# Patient Record
Sex: Male | Born: 1950 | Race: White | Hispanic: No | Marital: Single | Smoking: Current some day smoker
Health system: Southern US, Community
[De-identification: ages and names within clinical notes are randomized; demographics above are authoritative.]

## PROBLEM LIST (undated history)

## (undated) DIAGNOSIS — F32A Depression, unspecified: Secondary | ICD-10-CM

## (undated) DIAGNOSIS — F329 Major depressive disorder, single episode, unspecified: Secondary | ICD-10-CM

## (undated) DIAGNOSIS — C801 Malignant (primary) neoplasm, unspecified: Secondary | ICD-10-CM

## (undated) DIAGNOSIS — E669 Obesity, unspecified: Secondary | ICD-10-CM

## (undated) DIAGNOSIS — R0600 Dyspnea, unspecified: Secondary | ICD-10-CM

## (undated) DIAGNOSIS — I4891 Unspecified atrial fibrillation: Secondary | ICD-10-CM

## (undated) DIAGNOSIS — M1990 Unspecified osteoarthritis, unspecified site: Secondary | ICD-10-CM

## (undated) DIAGNOSIS — I739 Peripheral vascular disease, unspecified: Secondary | ICD-10-CM

## (undated) DIAGNOSIS — R011 Cardiac murmur, unspecified: Secondary | ICD-10-CM

## (undated) DIAGNOSIS — E119 Type 2 diabetes mellitus without complications: Secondary | ICD-10-CM

## (undated) DIAGNOSIS — J449 Chronic obstructive pulmonary disease, unspecified: Secondary | ICD-10-CM

## (undated) DIAGNOSIS — E785 Hyperlipidemia, unspecified: Secondary | ICD-10-CM

## (undated) DIAGNOSIS — I10 Essential (primary) hypertension: Secondary | ICD-10-CM

## (undated) DIAGNOSIS — K219 Gastro-esophageal reflux disease without esophagitis: Secondary | ICD-10-CM

## (undated) DIAGNOSIS — G43909 Migraine, unspecified, not intractable, without status migrainosus: Secondary | ICD-10-CM

## (undated) DIAGNOSIS — D649 Anemia, unspecified: Secondary | ICD-10-CM

## (undated) DIAGNOSIS — I8390 Asymptomatic varicose veins of unspecified lower extremity: Secondary | ICD-10-CM

## (undated) DIAGNOSIS — I2510 Atherosclerotic heart disease of native coronary artery without angina pectoris: Secondary | ICD-10-CM

## (undated) DIAGNOSIS — F419 Anxiety disorder, unspecified: Secondary | ICD-10-CM

## (undated) DIAGNOSIS — G4730 Sleep apnea, unspecified: Secondary | ICD-10-CM

## (undated) DIAGNOSIS — I350 Nonrheumatic aortic (valve) stenosis: Secondary | ICD-10-CM

## (undated) DIAGNOSIS — I872 Venous insufficiency (chronic) (peripheral): Secondary | ICD-10-CM

## (undated) DIAGNOSIS — I5032 Chronic diastolic (congestive) heart failure: Secondary | ICD-10-CM

## (undated) HISTORY — PX: ANKLE SURGERY: SHX546

## (undated) HISTORY — PX: TONSILLECTOMY AND ADENOIDECTOMY: SUR1326

## (undated) HISTORY — DX: Venous insufficiency (chronic) (peripheral): I87.2

## (undated) HISTORY — DX: Obesity, unspecified: E66.9

## (undated) HISTORY — DX: Sleep apnea, unspecified: G47.30

## (undated) HISTORY — PX: KNEE ARTHROSCOPY: SHX127

## (undated) HISTORY — PX: UPPER GI ENDOSCOPY: SHX6162

## (undated) HISTORY — DX: Asymptomatic varicose veins of unspecified lower extremity: I83.90

## (undated) HISTORY — PX: OTHER SURGICAL HISTORY: SHX169

## (undated) HISTORY — DX: Atherosclerotic heart disease of native coronary artery without angina pectoris: I25.10

## (undated) HISTORY — DX: Migraine, unspecified, not intractable, without status migrainosus: G43.909

## (undated) HISTORY — DX: Hyperlipidemia, unspecified: E78.5

## (undated) HISTORY — PX: COLONOSCOPY W/ POLYPECTOMY: SHX1380

---
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
## 1998-09-24 ENCOUNTER — Ambulatory Visit (HOSPITAL_COMMUNITY): Admission: RE | Admit: 1998-09-24 | Discharge: 1998-09-24 | Payer: Self-pay | Admitting: Endocrinology

## 1998-12-26 ENCOUNTER — Ambulatory Visit (HOSPITAL_COMMUNITY): Admission: RE | Admit: 1998-12-26 | Discharge: 1998-12-26 | Payer: Self-pay | Admitting: Endocrinology

## 1998-12-26 ENCOUNTER — Encounter: Payer: Self-pay | Admitting: Endocrinology

## 1999-07-10 ENCOUNTER — Emergency Department (HOSPITAL_COMMUNITY): Admission: EM | Admit: 1999-07-10 | Discharge: 1999-07-10 | Payer: Self-pay | Admitting: Emergency Medicine

## 1999-07-10 ENCOUNTER — Encounter: Payer: Self-pay | Admitting: Emergency Medicine

## 1999-08-26 ENCOUNTER — Ambulatory Visit (HOSPITAL_COMMUNITY): Admission: RE | Admit: 1999-08-26 | Discharge: 1999-08-26 | Payer: Self-pay | Admitting: Endocrinology

## 1999-08-26 ENCOUNTER — Encounter: Payer: Self-pay | Admitting: Endocrinology

## 1999-09-02 ENCOUNTER — Ambulatory Visit (HOSPITAL_COMMUNITY): Admission: RE | Admit: 1999-09-02 | Discharge: 1999-09-02 | Payer: Self-pay | Admitting: Endocrinology

## 2000-09-26 ENCOUNTER — Ambulatory Visit (HOSPITAL_BASED_OUTPATIENT_CLINIC_OR_DEPARTMENT_OTHER): Admission: RE | Admit: 2000-09-26 | Discharge: 2000-09-26 | Payer: Self-pay | Admitting: Neurology

## 2000-11-30 ENCOUNTER — Ambulatory Visit (HOSPITAL_BASED_OUTPATIENT_CLINIC_OR_DEPARTMENT_OTHER): Admission: RE | Admit: 2000-11-30 | Discharge: 2000-11-30 | Payer: Self-pay

## 2004-11-17 ENCOUNTER — Emergency Department (HOSPITAL_COMMUNITY): Admission: EM | Admit: 2004-11-17 | Discharge: 2004-11-17 | Payer: Self-pay | Admitting: *Deleted

## 2004-11-25 ENCOUNTER — Ambulatory Visit (HOSPITAL_COMMUNITY): Admission: RE | Admit: 2004-11-25 | Discharge: 2004-11-25 | Payer: Self-pay | Admitting: Orthopedic Surgery

## 2007-08-02 ENCOUNTER — Ambulatory Visit: Payer: Self-pay | Admitting: Vascular Surgery

## 2007-08-02 ENCOUNTER — Ambulatory Visit (HOSPITAL_COMMUNITY): Admission: RE | Admit: 2007-08-02 | Discharge: 2007-08-02 | Payer: Self-pay | Admitting: Endocrinology

## 2007-08-02 ENCOUNTER — Encounter (INDEPENDENT_AMBULATORY_CARE_PROVIDER_SITE_OTHER): Payer: Self-pay | Admitting: Endocrinology

## 2010-05-03 ENCOUNTER — Encounter (HOSPITAL_BASED_OUTPATIENT_CLINIC_OR_DEPARTMENT_OTHER)
Admission: RE | Admit: 2010-05-03 | Discharge: 2010-06-10 | Payer: Self-pay | Source: Home / Self Care | Attending: Internal Medicine | Admitting: Internal Medicine

## 2010-05-07 ENCOUNTER — Ambulatory Visit (HOSPITAL_COMMUNITY): Admission: RE | Admit: 2010-05-07 | Discharge: 2010-05-07 | Payer: Self-pay | Admitting: Internal Medicine

## 2010-05-11 ENCOUNTER — Ambulatory Visit: Payer: Self-pay | Admitting: Vascular Surgery

## 2010-11-02 NOTE — Procedures (Signed)
DUPLEX DEEP VENOUS EXAM - LOWER EXTREMITY   INDICATION:  Bilateral lower extremity ulceration.   HISTORY:  Edema:  Yes.  Trauma/Surgery:  Left lower extremity.  Pain:  Bilateral.  PE:  No.  Previous DVT:  No.  Anticoagulants:  Other:   DUPLEX EXAM:                CFV   SFV   PopV  PTV    GSV                R  L  R  L  R  L  R   L  R  L  Thrombosis    0  0  0  0  0  0  0   0  0  0  Spontaneous   +  +  +  +  +  +  +   +  +  +  Phasic        +  +  +  +  +  +  +   +  +  +  Augmentation  +  +  +  +  +  +  +   +  +  +  Compressible  +  +  +  +  +  +  +   +  +  +  Competent     0  0  0  0  +  +  +   +  +  +   Legend:  + - yes  o - no  p - partial  D - decreased   IMPRESSION:  1. No evidence of deep venous thrombosis / superficial venous      thrombophlebitis in the bilateral lower extremity.  2. No significant reflux in the superficial venous system although      there are multiple branches noted in the bilateral calves.  3. Chronic venous insufficiency observed in the bilateral CFV and SFV,      left greater than right.  4. Technically difficult study due to edema.  Bilateral peroneal veins      not visualized.    _____________________________  Janetta Hora Darrick Penna, M.D.   LT/MEDQ  D:  05/11/2010  T:  05/11/2010  Job:  [id number]

## 2010-11-05 NOTE — Op Note (Signed)
NAME:  Jordan [name], STEIL NO.:  [id number]   MEDICAL RECORD NO.:  [id number]          PATIENT TYPE:  OIB   LOCATION:  2550                         FACILITY:  [facility]   PHYSICIAN:  Doralee Albino. Carola Frost, M.D. DATE OF BIRTH:  May 02, 1951   DATE OF PROCEDURE:  11/25/2004  DATE OF DISCHARGE:                                 OPERATIVE REPORT   PREOPERATIVE DIAGNOSIS:  Displaced left medial malleolus fracture.   POSTOPERATIVE DIAGNOSIS:  Displaced left medial malleolus fracture.   PROCEDURE:  Open reduction and internal fixation, left medial malleolus.   ASSISTANT:  Cecil Cranker, PA.   ANESTHESIA:  General, supplemented with popliteal block.   COMPLICATIONS:  None.   TOURNIQUET:  None.   ESTIMATED BLOOD LOSS:  Scant.   DISPOSITION:  To PACU.   CONDITION:  Stable.   BRIEF SUMMARY OF INDICATION FOR PROCEDURE:  Jordan [name] is a 60-year-  old white male diabetic who sustained a left long finger metacarpal fracture  and a left medial malleolus fracture as well.  The patient was treated  initially with a Cam boot and weightbearing as tolerated by consulting  physician and then was referred to [place] for further management.  After  discussion of risks and benefits of surgery, the patient wished to proceed  with reduction and internal fixation of his medial malleolus fracture.  He  understood the risks to include infection, nerve injury,vessel injury,  malunion, nonunion, and possible need for further surgery.  After full  discussion of these risks, the patient wished to proceed.  Also of note, he  did have an anterior tibial scratch which appeared to have some surrounding  erythema and was treated preoperatively with Duricef.  It did not  appreciably improve, and, consequently, he was given Unasyn preoperatively.   BRIEF DESCRIPTION OF PROCEDURE:  [name] was taken to the operating  room where general anesthesia was induced and Unasyn was given for  preoperative antibiotics.  Standard prep and drape were performed of his  left lower extremity.  The erythema around his shin did not come close to  his surgical site and did appear to improve with the Unasyn over the course  of the procedure.  No tourniquet was used.  A standard curvilinear incision  along the anterior and distal aspects of the medial malleolus was made, the  saphenous vein identified and protected throughout the procedure, and the  talus examined for chondral injury.  There was no significant chondral  injury.  The posterior tibial tendon was also visualized and was intact.  The fracture edges were elevated with a 15 blade to brush back the  periosteum to allow for an anatomic reduction, which was held with a K wire  while a drill was placed posterior to this.  A screw was inserted and then a  second placed anteriorly where the pin had been.  This resulted in an  anatomic reduction with good compression across the fracture site as  visualized on AP lateral and mortise images.  The wound was copiously  irrigated and closed in standard layer fashion with 2-0 Vicryl and 3-0 nylon  simple sutures.  A sterile, gently compressive dressing was applied, using  posterior and stirrup slabs of plaster.  The patient was awakened from  anesthesia and transported to the PACU in stable condition.   PROGNOSIS:  [name] prognosis with this fracture is good.  However,  because of his diabetes, he is at elevated risk of infection and wound  complications, and, because of his CPAP, we are anticipating an overnight  admission for observation.  If he completely recovers, is feeling well and  wishes to go home and has adequate supervision there, then he may use his  own CPAP machine at home as well.  We will switch his antibiotic coverage  for cellulitis to Augmentin, and we will see him back early, in the clinic,  to make sure that this has resolved uneventfully.  He will maintain   nonweightbearing until his follow-up appointment, at which time we  anticipate some graduated return to weightbearing as his wound and  cellulitis stabilize.  We will allow him to begin weightbearing using his  upper extremity with or without a platform walker as tolerated, given this  is essentially a nondisplaced base of the long finger metacarpal fracture.       MHH/MEDQ  D:  11/25/2004  T:  11/25/2004  Job:  [id number]

## 2012-09-13 ENCOUNTER — Inpatient Hospital Stay (HOSPITAL_COMMUNITY)
Admission: AD | Admit: 2012-09-13 | Discharge: 2012-09-17 | DRG: 603 | Disposition: A | Discharge status: Home / Self Care | Payer: Medicare Other | Source: Ambulatory Visit | Attending: Endocrinology | Admitting: Endocrinology

## 2012-09-13 ENCOUNTER — Other Ambulatory Visit: Payer: Self-pay | Admitting: Endocrinology

## 2012-09-13 ENCOUNTER — Encounter (HOSPITAL_COMMUNITY): Payer: Self-pay | Admitting: *Deleted

## 2012-09-13 DIAGNOSIS — F329 Major depressive disorder, single episode, unspecified: Secondary | ICD-10-CM | POA: Diagnosis present

## 2012-09-13 DIAGNOSIS — L03119 Cellulitis of unspecified part of limb: Principal | ICD-10-CM | POA: Diagnosis present

## 2012-09-13 DIAGNOSIS — E119 Type 2 diabetes mellitus without complications: Secondary | ICD-10-CM | POA: Insufficient documentation

## 2012-09-13 DIAGNOSIS — R609 Edema, unspecified: Secondary | ICD-10-CM

## 2012-09-13 DIAGNOSIS — L03116 Cellulitis of left lower limb: Secondary | ICD-10-CM | POA: Insufficient documentation

## 2012-09-13 DIAGNOSIS — R269 Unspecified abnormalities of gait and mobility: Secondary | ICD-10-CM | POA: Diagnosis present

## 2012-09-13 DIAGNOSIS — R7989 Other specified abnormal findings of blood chemistry: Secondary | ICD-10-CM | POA: Diagnosis present

## 2012-09-13 DIAGNOSIS — F3289 Other specified depressive episodes: Secondary | ICD-10-CM | POA: Diagnosis present

## 2012-09-13 DIAGNOSIS — I10 Essential (primary) hypertension: Secondary | ICD-10-CM | POA: Diagnosis present

## 2012-09-13 DIAGNOSIS — L02419 Cutaneous abscess of limb, unspecified: Principal | ICD-10-CM | POA: Diagnosis present

## 2012-09-13 DIAGNOSIS — E871 Hypo-osmolality and hyponatremia: Secondary | ICD-10-CM | POA: Diagnosis present

## 2012-09-13 DIAGNOSIS — K219 Gastro-esophageal reflux disease without esophagitis: Secondary | ICD-10-CM | POA: Diagnosis present

## 2012-09-13 DIAGNOSIS — D649 Anemia, unspecified: Secondary | ICD-10-CM | POA: Diagnosis present

## 2012-09-13 DIAGNOSIS — I872 Venous insufficiency (chronic) (peripheral): Secondary | ICD-10-CM | POA: Diagnosis present

## 2012-09-13 DIAGNOSIS — E876 Hypokalemia: Secondary | ICD-10-CM | POA: Diagnosis present

## 2012-09-13 DIAGNOSIS — E785 Hyperlipidemia, unspecified: Secondary | ICD-10-CM | POA: Diagnosis present

## 2012-09-13 HISTORY — DX: Anxiety disorder, unspecified: F41.9

## 2012-09-13 HISTORY — DX: Chronic obstructive pulmonary disease, unspecified: J44.9

## 2012-09-13 HISTORY — DX: Unspecified osteoarthritis, unspecified site: M19.90

## 2012-09-13 HISTORY — DX: Major depressive disorder, single episode, unspecified: F32.9

## 2012-09-13 HISTORY — DX: Depression, unspecified: F32.A

## 2012-09-13 HISTORY — DX: Essential (primary) hypertension: I10

## 2012-09-13 HISTORY — DX: Gastro-esophageal reflux disease without esophagitis: K21.9

## 2012-09-13 HISTORY — DX: Type 2 diabetes mellitus without complications: E11.9

## 2012-09-13 LAB — COMPREHENSIVE METABOLIC PANEL
Albumin: 2.6 g/dL — ABNORMAL LOW (ref 3.5–5.2)
BUN: 13 mg/dL (ref 6–23)
CO2: 25 mEq/L (ref 19–32)
Chloride: 85 mEq/L — ABNORMAL LOW (ref 96–112)
Glucose, Bld: 107 mg/dL — ABNORMAL HIGH (ref 70–99)
Sodium: 124 mEq/L — ABNORMAL LOW (ref 135–145)
Total Bilirubin: 0.7 mg/dL (ref 0.3–1.2)
Total Protein: 7.2 g/dL (ref 6.0–8.3)

## 2012-09-13 LAB — CBC
HCT: 31.2 % — ABNORMAL LOW (ref 39.0–52.0)
Hemoglobin: 9.7 g/dL — ABNORMAL LOW (ref 13.0–17.0)
RDW: 19.1 % — ABNORMAL HIGH (ref 11.5–15.5)

## 2012-09-13 LAB — GLUCOSE, CAPILLARY: Glucose-Capillary: 117 mg/dL — ABNORMAL HIGH (ref 70–99)

## 2012-09-13 MED ORDER — VANCOMYCIN HCL 10 G IV SOLR
2500.0000 mg | Freq: Once | INTRAVENOUS | Status: AC
  Administered 2012-09-13: 2500 mg via INTRAVENOUS
  Filled 2012-09-13 (×2): qty 2500

## 2012-09-13 MED ORDER — FUROSEMIDE 40 MG PO TABS
40.0000 mg | ORAL_TABLET | Freq: Two times a day (BID) | ORAL | Status: DC
  Administered 2012-09-14 – 2012-09-17 (×7): 40 mg via ORAL
  Filled 2012-09-13 (×10): qty 1

## 2012-09-13 MED ORDER — PANTOPRAZOLE SODIUM 40 MG PO TBEC
40.0000 mg | DELAYED_RELEASE_TABLET | Freq: Every day | ORAL | Status: DC
  Administered 2012-09-14 – 2012-09-16 (×3): 40 mg via ORAL
  Filled 2012-09-13 (×5): qty 1

## 2012-09-13 MED ORDER — METHOCARBAMOL 500 MG PO TABS: 500.0000 mg | ORAL_TABLET | Freq: Three times a day (TID) | ORAL | Status: DC | PRN

## 2012-09-13 MED ORDER — ALISKIREN FUMARATE 150 MG PO TABS
300.0000 mg | ORAL_TABLET | Freq: Every day | ORAL | Status: DC
  Administered 2012-09-14 – 2012-09-16 (×3): 300 mg via ORAL
  Filled 2012-09-13 (×6): qty 2

## 2012-09-13 MED ORDER — ASPIRIN 81 MG PO CHEW
81.0000 mg | CHEWABLE_TABLET | Freq: Every day | ORAL | Status: DC
  Administered 2012-09-14 – 2012-09-16 (×3): 81 mg via ORAL
  Filled 2012-09-13 (×5): qty 1

## 2012-09-13 MED ORDER — INSULIN ASPART 100 UNIT/ML ~~LOC~~ SOLN: 0.0000 [IU] | Freq: Every day | SUBCUTANEOUS | Status: DC

## 2012-09-13 MED ORDER — ATENOLOL 50 MG PO TABS
50.0000 mg | ORAL_TABLET | Freq: Every day | ORAL | Status: DC
  Administered 2012-09-14 – 2012-09-16 (×3): 50 mg via ORAL
  Filled 2012-09-13 (×5): qty 1

## 2012-09-13 MED ORDER — INSULIN ASPART 100 UNIT/ML ~~LOC~~ SOLN
0.0000 [IU] | Freq: Three times a day (TID) | SUBCUTANEOUS | Status: DC
Start: 2012-09-13 — End: 2012-09-17

## 2012-09-13 MED ORDER — INSULIN GLARGINE 100 UNIT/ML ~~LOC~~ SOLN
10.0000 [IU] | Freq: Every day | SUBCUTANEOUS | Status: DC
  Administered 2012-09-14 – 2012-09-16 (×3): 10 [IU] via SUBCUTANEOUS
  Filled 2012-09-13 (×4): qty 0.1

## 2012-09-13 MED ORDER — LORAZEPAM 1 MG PO TABS
1.0000 mg | ORAL_TABLET | Freq: Three times a day (TID) | ORAL | Status: DC
  Administered 2012-09-13 – 2012-09-16 (×10): 1 mg via ORAL
  Filled 2012-09-13 (×10): qty 1

## 2012-09-13 MED ORDER — VENLAFAXINE HCL ER 150 MG PO CP24
150.0000 mg | ORAL_CAPSULE | Freq: Every day | ORAL | Status: DC
  Filled 2012-09-13: qty 1

## 2012-09-13 MED ORDER — VENLAFAXINE HCL ER 75 MG PO CP24
75.0000 mg | ORAL_CAPSULE | Freq: Every day | ORAL | Status: DC
  Administered 2012-09-14 – 2012-09-17 (×4): 75 mg via ORAL
  Filled 2012-09-13 (×5): qty 1

## 2012-09-13 MED ORDER — ENOXAPARIN SODIUM 40 MG/0.4ML ~~LOC~~ SOLN
40.0000 mg | SUBCUTANEOUS | Status: DC
  Administered 2012-09-14 – 2012-09-16 (×3): 40 mg via SUBCUTANEOUS
  Filled 2012-09-13 (×5): qty 0.4

## 2012-09-13 MED ORDER — VENLAFAXINE HCL ER 150 MG PO CP24
150.0000 mg | ORAL_CAPSULE | Freq: Every day | ORAL | Status: DC
  Administered 2012-09-13 – 2012-09-16 (×4): 150 mg via ORAL
  Filled 2012-09-13 (×5): qty 1

## 2012-09-13 MED ORDER — INSULIN ASPART 100 UNIT/ML ~~LOC~~ SOLN
3.0000 [IU] | Freq: Three times a day (TID) | SUBCUTANEOUS | Status: DC
  Administered 2012-09-14 – 2012-09-16 (×6): 3 [IU] via SUBCUTANEOUS

## 2012-09-13 MED ORDER — PIPERACILLIN-TAZOBACTAM 3.375 G IVPB
3.3750 g | Freq: Three times a day (TID) | INTRAVENOUS | Status: DC
  Administered 2012-09-13 – 2012-09-17 (×11): 3.375 g via INTRAVENOUS
  Filled 2012-09-13 (×12): qty 50

## 2012-09-13 MED ORDER — ATORVASTATIN CALCIUM 80 MG PO TABS
80.0000 mg | ORAL_TABLET | Freq: Every day | ORAL | Status: DC
  Administered 2012-09-13 – 2012-09-16 (×4): 80 mg via ORAL
  Filled 2012-09-13 (×6): qty 1

## 2012-09-13 MED ORDER — PERPHENAZINE 2 MG PO TABS
2.0000 mg | ORAL_TABLET | Freq: Two times a day (BID) | ORAL | Status: DC
  Administered 2012-09-13 – 2012-09-16 (×6): 2 mg via ORAL
  Filled 2012-09-13 (×9): qty 1

## 2012-09-13 MED ORDER — VANCOMYCIN HCL 10 G IV SOLR
1250.0000 mg | Freq: Two times a day (BID) | INTRAVENOUS | Status: DC
  Administered 2012-09-14 – 2012-09-17 (×7): 1250 mg via INTRAVENOUS
  Filled 2012-09-13 (×7): qty 1250

## 2012-09-13 NOTE — Progress Notes (Signed)
ANTIBIOTIC CONSULT NOTE - INITIAL  Pharmacy Consult for Vancomycin/Zosyn Indication: cellulitis/abscess  Allergies  Allergen Reactions  . Altace (Ramipril)     Dizziness, migraine, weakness  . Codeine Itching  . Naproxen Itching    Patient Measurements: Height: 6' (182.9 cm) Weight: 269 lb 4.8 oz (122.154 kg) IBW/kg (Calculated) : 77.6 Adjusted Body Weight: 95.4kg  Vital Signs: Temp: 98.2 F (36.8 C) (03/27 1704) Temp src: Oral (03/27 1704) BP: 126/50 mmHg (03/27 1704) Pulse Rate: 82 (03/27 1704) Intake/Output from previous day:   Intake/Output from this shift:    Labs:  Recent Labs  09/13/12 1710  WBC 13.3*  HGB 9.7*  PLT 239  CREATININE 0.81   Estimated Creatinine Clearance: 127.6 ml/min (by C-G formula based on Cr of 0.81). No results found for this basename: VANCOTROUGH, VANCOPEAK, VANCORANDOM, GENTTROUGH, GENTPEAK, GENTRANDOM, TOBRATROUGH, TOBRAPEAK, TOBRARND, AMIKACINPEAK, AMIKACINTROU, AMIKACIN,  in the last 72 hours   Microbiology: No results found for this or any previous visit (from the past 720 hour(s)).  Medical History: Past Medical History  Diagnosis Date  . Anxiety   . Arthritis   . COPD (chronic obstructive pulmonary disease)   . Depression   . Diabetes mellitus without complication   . GERD (gastroesophageal reflux disease)   . Hypertension     Medications:  Scheduled:  . aliskiren  300 mg Oral Daily  . aspirin  81 mg Oral Daily  . atenolol  50 mg Oral Daily  . atorvastatin  80 mg Oral q1800  . enoxaparin (LOVENOX) injection  40 mg Subcutaneous Q24H  . furosemide  40 mg Oral BID  . insulin aspart  0-15 Units Subcutaneous TID WC  . insulin aspart  0-5 Units Subcutaneous QHS  . insulin aspart  3 Units Subcutaneous TID WC  . insulin glargine  10 Units Subcutaneous QHS  . LORazepam  1 mg Oral TID  . pantoprazole  40 mg Oral Daily  . perphenazine  2 mg Oral BID  . [START ON 09/14/2012] venlafaxine XR  150 mg Oral Q breakfast    Infusions:   PRN: methocarbamol Assessment: 62 yo male with hx DM to be admitted with leg cellulitis/abscess that has worsened after starting Augmentin as outpatient. Note patient also with healing DM foot ulcer  Goal of Therapy:  Vancomycin trough level 10-15 mcg/ml  Plan:  1) Vancomycin 2500 mg IV x 1 loading dose, then Vancomycin 1250mg  IV q 12 hours 2) Zosyn 3.375 GM IV q 8 hour EI Measure antibiotic drug levels at steady state  Loletta Specter 09/13/2012,7:45 PM

## 2012-09-13 NOTE — H&P (Signed)
Jordan [name] DOB 17-Sep-1950   Calculated Weight: Cant stand  lbs., ( 0 kg) Height: 69.5 in., ( 176.53 cm) Pulse rate: 80  Blood Pressure #1: 126 / 70 mm Hg    BSA: 0   Vitals entered by: Daryel November CMA on September 13, 2012 11:19 AM        Risk Factors:  Tobacco use:  former smoker    Year quit:  08-09    Pack-years:  2ppd x32y Passive smoke exposure:  no Drug use:  no HIV high-risk behavior:  no Caffeine use:  0 drinks per day Alcohol use:  no Exercise:  yes    Times per week:  5    Type:  walk/greenhouse Seatbelt use:  100 % Sun Exposure:  occasionally  Colonoscopy History:    Date of Last Colonoscopy:  04/26/2011  History of Present Illness  Reason for visit: Routine Follow-up Chief Complaint: Leg is worse and leaking fluid History of Present Illness: Seen 3 days ago. mid left leg redness noted. On augmentin Leg is now leaking fluid and is more red no f/c/s bowels OK BS's not overly high, no lows at all Slept pretty well similar weeping of legs about 6 yrs ago. needed UNNA boot at that time   Review of Systems  General:       Complains of fatigue, malaise.        Denies fevers, chills, headache, sweats, sleep disturbance.   Eyes:       Denies blurring, irritation, vision loss, photophobia.   Ears/Nose/Throat:       Denies sore throat, hoarseness, dysphagia.   Cardiovascular:       Complains of peripheral edema.        Denies chest pains, palpitations, dyspnea on exertion.   Respiratory:       Denies cough, dyspnea, wheezing.   Gastrointestinal:       Denies nausea, vomiting, diarrhea, constipation, heartburn.   Genitourinary:       Complains of nocturia.        Denies urinary frequency, urinary hesitancy.   Musculoskeletal:       Complains of stiffness, arthritis.        Denies back pain.   Skin:       Complains of rash, dryness.        Denies itching.   Neurologic:       Complains of paresthesias, gait instability.        Denies  tremors, vertigo, dizziness.   Psychiatric:       Denies depression, anxiety.   Endocrine:       Denies cold intolerance, heat intolerance.   Heme/Lymphatic:       Denies abnormal bruising, bleeding.   Allergic/Immunologic:       Denies urticaria, hay fever.    Past History Past Medical History (reviewed - no changes required): DM 2  about 10 yrs ago, always on OHA Hypertension hyperlipidemia GERD Venous stasis ulcer 2011 [place] 2011: No evidence of deep venous thrombosis / superficial venous thrombophlebitis in the bilateral lower extremity.   2. No significant reflux in the superficial venous system although there are multiple branches noted in the bilateral calves.   3. Chronic venous insufficiency observed in the bilateral CFV and SFV,  left greater than right.   4. Technically difficult study due to edema.  Bilateral peroneal veins    not visualized.    Hospitalizations have been 15 years ago  with a TIA.  There has incidentally been  no recurrence.  MRI 2000:  IMPRESSION:  1.  ABNORMAL FOCI OF INCREASED SIGNAL  INTENSITY INVOLVING THE DEEP PARIETAL LOBE WHITE MATTER  REGIONS AS DESCRIBED, AND THE POSTERIOR FRONTAL REGIONS  ALSO, AS DESCRIBED  ABOVE.  THESE AREAS ARE NON-ENHANCING.   2.  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  3.  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  CLINICAL CORRELATION WOULD BE HELPFUL. 2009:   SUMMARY:   -  Bilateral ABIs and pedal waveforms within normal limits. anxiety/panic attacks 1992 ECHO in past (due to Redux exposure, all OK) colon check a couple of yrs ago, no polyps Prior PVax Surgical History (reviewed - no changes required): Surgeries include childhood tonsillectomy,   arthroscopic knee surgery on the left in 1996 (Open reduction and internal fixation, left medial  malleolus.and eventual open knee  surgery on the left in 2004. Family History (reviewed - no changes required):  positive for type 2 diabetes and hypertension.  Dad died with aspiration pneumonia, had CVA and CAD, 28 Mom died with Alzheimers, had HTN, 54    Social History (reviewed - no changes required): The patient is single, lives alone.  Does not smoke (ex 4 yrs after 2 ppd for 32 yrs),  drink alcohol, or use recreational drugs Retired Proofreader, now has a Careers information officer, retired age 36 High PT [employer], [employer], [employer], social studies has masters degree   Physical Exam  General appearance: healthy NAD, non toxic, in WC  Eyes  External: conjunctivae and lids normal Pupils: equal, round, reactive to light and accommodation  Ears, Nose and Throat  External ears: normal, no lesions or deformities External nose: normal, no lesions or deformities Dental: good dentition  Neck  Neck: supple, no masses, trachea midline Thyroid: no nodules, masses, tenderness, or enlargement  Respiratory  Respiratory effort: no intercostal retractions or use of accessory muscles Auscultation: no rales, rhonchi, or wheezes  Cardiovascular  Auscultation: S1, S2, no murmur, rub, or gallop Pedal pulses: intact, feet warm Periph. circulation: 4+ right edema with red weeping rash, hot, left 3+ not red  Gastrointestinal  Abdomen: soft, non-tender, no masses, bowel sounds normal Liver and spleen: no enlargement or nodularity  Lymphatic  Neck: no cervical adenopathy  Musculoskeletal  Gait and station: in [facility] Digits and nails: fungal LLE: healing ulcer end of great toe  Skin  Inspection: ascending cellulitis right Speech: Normal Fall Risk Screening: Unstable Gait  Mental Status Exam  Judgment, insight: intact Orientation: oriented to time, place, and person Memory: intact Mood and affect: Normal Mood Mentation: Alert & Oriented Hallucinations/Delusion: No evidence   Impression &  Recommendations:  Problem # 1:  Cellulitis/abscess, leg (ICD-682.6) (ICD10-L08.9) will need to be admitted clearly has failed outpt not septic but the leg has enlarged need to make sure there is not a DVT as well  Area of entry appears to be the mid leg rather than the toe will hit with Zosyn and Vanco  CBC:   WBC:  13.60 (09/10/2012 12:42:00 PM) RBC:  4.6 M/UL HGB:  10.1 MCV:  71.5 MCH:  21.8 Platelet:  297   Problem # 2:  DIABETIC FOOT ULCER, TOE (ICD-707.15) (ICD10-E11.621) this is actually looking pretty good  Problem # 3:  OBESITY (ICD-278.00) (ICD10-E66.9) BMI:  44.54 (09/10/2012 11:47:49 AM), weight: Cant stand  (09/13/2012 11:11:49 AM), height: 69.5 (09/13/2012 11:11:49 AM)  Problem # 4:  HYPERLIPIDEMIA (ICD-272.4) (ICD10-E78.5) Total 128 (09/05/2012 3:58:00 PM), Trig 82 (09/05/2012 3:58:00 PM) HDL 29 MG/DL (16/03/9603 5:40:98 PM) LDL 83 (09/05/2012 3:58:00 PM)     His updated medication list for this problem includes:    Lipitor 80 Mg Tabs (Atorvastatin calcium) ..... Once daily   Problem # 5:  HYPERTENSION, BENIGN (ICD-401.1) (ICD10-I10) B/P: 126/70  at target Glucose: 77 (09/05/2012 3:58:00 PM), BUN: 10 (09/05/2012 3:58:00 PM),Creatinine: 0.8 (09/05/2012 3:58:00 PM), Sodium: 138 MEQ/L (09/05/2012 3:58:00 PM), Potassium: 4.3 MEQ/L (09/05/2012 3:58:00 PM), Chloride: 102 (09/05/2012 3:58:00 PM), CO2: 33 MEQ/L (09/05/2012 3:58:00 PM), Calcium: 8.8 (09/05/2012 3:58:00 PM)   His updated medication list for this problem includes:    Atenolol 50 Mg Tabs (Atenolol) .Marland Kitchen... 1 tab po qd    Furosemide 40 Mg Tabs (Furosemide) ..... Once daily    Tekturna 300 Mg Tabs (Aliskiren fumarate) .Marland Kitchen... 1 tab po qd   Problem # 6:  GERD (ICD-530.81) (JXB14-N82.9) on Rx doing well  Problem # 7:  DIABETES MELLITUS, TYPE II (ICD-250.00) (ICD10-E11.9) Hgb A1C: 5.6%   His updated medication list for this problem includes:    Metformin Hcl 500 Mg Tabs (Metformin hcl) .Marland Kitchen... 1 tab  po bid    Actos 45 Mg Tabs (Pioglitazone hcl) .Marland Kitchen... 1 tab po qd   Complete Medication List: 1)  Nu-iron 150 Mg Caps (Polysaccharide iron complex) .... Take one capsule once daily 2)  Augmentin 875-125 Mg Tabs (Amoxicillin-pot clavulanate) .... One twice a day with food 3)  Rocephin 1 Gm Solr (Ceftriaxone sodium) .Marland Kitchen.. 1 gm intramuscular right gluteal 4)  Fioricet 50-325-40 Mg Tabs (Butalbital-apap-caffeine) .... Use as directed 5)  Promethazine Hcl 25 Mg Tabs (Promethazine hcl) .Marland Kitchen.. 1 tab po q4hrs prn 6)  Metformin Hcl 500 Mg Tabs (Metformin hcl) .Marland Kitchen.. 1 tab po bid 7)  Lorazepam 1 Mg Tabs (Lorazepam) .... Tid 8)  Gnp One Daily Mens 50+advanced Tabs (Multiple vitamins-minerals) .... Once daily 9)  Aspirin 81 Mg Tabs (Aspirin) .... Once daily 10)  Atenolol 50 Mg Tabs (Atenolol) .Marland Kitchen.. 1 tab po qd 11)  Actos 45 Mg Tabs (Pioglitazone hcl) .Marland Kitchen.. 1 tab po qd 12)  Furosemide 40 Mg Tabs (Furosemide) .... Once daily 13)  Effexor Xr 75 Mg Xr24h-cap (Venlafaxine hcl) .Marland Kitchen.. 1 tab po bid 14)  Tekturna 300 Mg Tabs (Aliskiren fumarate) .Marland Kitchen.. 1 tab po qd 15)  Perphenazine 2 Mg Tabs (Perphenazine) .... Bid 16)  Lipitor 80 Mg Tabs (Atorvastatin calcium) .... Once daily 17)  Prilosec Otc 20 Mg Tbec (Omeprazole magnesium) .Marland Kitchen.. 1-2 tabs daily 18)  Methocarbamol 500 Mg Tabs (Methocarbamol) .Marland Kitchen.. 1 po q3hours prn pain    Electronically signed by Assunta Curtis MD on 09/13/2012 at 11:46 AM  ________________________________________________________________________

## 2012-09-14 DIAGNOSIS — E782 Mixed hyperlipidemia: Secondary | ICD-10-CM

## 2012-09-14 LAB — BASIC METABOLIC PANEL
Calcium: 8.5 mg/dL (ref 8.4–10.5)
GFR calc non Af Amer: >90 mL/min (ref >90)
Sodium: 131 mEq/L — ABNORMAL LOW (ref 135–145)

## 2012-09-14 LAB — CBC
Platelets: 258 10*3/uL (ref 150–400)
RBC: 4.03 MIL/uL — ABNORMAL LOW (ref 4.22–5.81)
WBC: 9.8 10*3/uL (ref 4.0–10.5)

## 2012-09-14 LAB — GLUCOSE, CAPILLARY
Glucose-Capillary: 98 mg/dL (ref 70–99)
Glucose-Capillary: 91 mg/dL (ref 70–99)

## 2012-09-14 NOTE — Progress Notes (Signed)
Physician Daily Progress Note  Subjective: No events last night   Objective: Vital signs in last 24 hours: Temp:  [98.2 F (36.8 C)-98.7 F (37.1 C)] 98.6 F (37 C) (03/28 0622) Pulse Rate:  [76-94] 94 (03/28 0622) Resp:  [20-24] 20 (03/28 0622) BP: (122-130)/(50-64) 130/64 mmHg (03/28 0622) SpO2:  [96 %-100 %] 96 % (03/28 0622) Weight:  [269 lb 4.8 oz (122.154 kg)-293 lb 11.2 oz (133.221 kg)] 293 lb 11.2 oz (133.221 kg) (03/28 0622) Weight change:  Last BM Date: 09/11/12  CBG (last 3)   Recent Labs  09/13/12 1723 09/13/12 2120  GLUCAP 117* 76    Intake/Output from previous day:  Intake/Output Summary (Last 24 hours) at 09/14/12 0703 Last data filed at 09/14/12 8119  Gross per 24 hour  Intake    600 ml  Output      0 ml  Net    600 ml   03/27 0701 - 03/28 0700 In: 600 [IV Piggyback:600] Out: -   Physical Exam General appearance: WM in NAD  Eyes: no scleral icterus, moist conjunctiva  Throat: oropharynx moist without erythema Resp: CTAB, mild exp wheezes diffusely  Cardio: RRR, no MRG  GI: soft, non-tender; bowel sounds normal; no masses,  no organomegaly Extremities: stasis dermatitis of LLE and RLE wound is well wrapped in guaze, kerlex from the knee to foot. 1+ DP pulses B/L    Lab Results:  Recent Labs  09/13/12 1710 09/14/12 0424  NA 124* 131*  K 3.4* 3.3*  CL 85* 94*  CO2 25 27  GLUCOSE 107* 93  BUN 13 11  CREATININE 0.81 0.68  CALCIUM 9.0 8.5     Recent Labs  09/13/12 1710  AST 51*  ALT 28  ALKPHOS 143*  BILITOT 0.7  PROT 7.2  ALBUMIN 2.6*     Recent Labs  09/13/12 1710 09/14/12 0424  WBC 13.3* 9.8  HGB 9.7* 8.7*  HCT 31.2* 27.7*  MCV 68.7* 68.7*  PLT 239 258    No results found for this basename: INR, PROTIME    No results found for this basename: CKTOTAL, CKMB, CKMBINDEX, TROPONINI,  in the last 72 hours  No results found for this basename: TSH, T4TOTAL, FREET3, T3FREE, THYROIDAB,  in the last 72 hours  No  results found for this basename: VITAMINB12, FOLATE, FERRITIN, TIBC, IRON, RETICCTPCT,  in the last 72 hours  Micro Results: No results found for this or any previous visit (from the past 240 hour(s)).  Studies/Results: No results found.   Medications: Scheduled: . aliskiren  300 mg Oral Daily  . aspirin  81 mg Oral Daily  . atenolol  50 mg Oral Daily  . atorvastatin  80 mg Oral q1800  . enoxaparin (LOVENOX) injection  40 mg Subcutaneous Q24H  . furosemide  40 mg Oral BID  . insulin aspart  0-15 Units Subcutaneous TID WC  . insulin aspart  0-5 Units Subcutaneous QHS  . insulin aspart  3 Units Subcutaneous TID WC  . insulin glargine  10 Units Subcutaneous QHS  . LORazepam  1 mg Oral TID  . pantoprazole  40 mg Oral Daily  . perphenazine  2 mg Oral BID  . piperacillin-tazobactam (ZOSYN)  IV  3.375 g Intravenous Q8H  . vancomycin  1,250 mg Intravenous Q12H  . venlafaxine XR  150 mg Oral QHS   And  . venlafaxine XR  75 mg Oral Q breakfast   Continuous:   Assessment/Plan: 62 yo male admitted for failed outpt  therapy of lower extremity cellulitis   #  Cellulitis/abscess, leg  On vanc and zosyn day 2 per pharm consult  Leukocytosis has resolved  Afebrile and normotensive  Continue current IV abx  Encourage PO hydration. No IVF at this time Blood cultures pending    # HYPERLIPIDEMIA  Cont home meds  # HYPERTENSION, BENIGN   cont home meds  #GERD  Cont home meds  # DIABETES MELLITUS, TYPE II  Home regimen plus SSI  Well controlled   DVT Prophylaxis - lovenox   Dispo - D/C early next week after improvement on IV abx    LOS: 1 day   Jordan [name] 09/14/2012, 7:03 AM

## 2012-09-14 NOTE — Care Management Note (Addendum)
    Page 1 of 1   09/14/2012     6:25:20 PM   CARE MANAGEMENT NOTE 09/14/2012  Patient:  Jordan [name], Jordan [name]   Account Number:  [id number]  Date Initiated:  09/14/2012  Documentation initiated by:  Lanier Clam  Subjective/Objective Assessment:   ADMITTED W/L LEG CELLULITIS.     Action/Plan:   FROM HOME.   Anticipated DC Date:  09/19/2012   Anticipated DC Plan:  HOME/SELF CARE      DC Planning Services  CM consult      Choice offered to / List presented to:             Status of service:  In process, will continue to follow Medicare Important Message given?   (If response is "NO", the following Medicare IM given date fields will be blank) Date Medicare IM given:   Date Additional Medicare IM given:    Discharge Disposition:    Per UR Regulation:  Reviewed for med. necessity/level of care/duration of stay  If discussed at Long Length of Stay Meetings, dates discussed:    Comments:  09/14/12 [facility] RN,BSN NCM [phone]

## 2012-09-14 NOTE — Progress Notes (Addendum)
*  PRELIMINARY RESULTS* Vascular Ultrasound Right lower extremity venous duplex has been completed.  Preliminary findings: Right:  No obvious evidence of DVT in visualized veins. Could not clearly visualize calf veins due to edema.   Farrel Demark, RDMS, RVT  09/14/2012, 9:00 AM

## 2012-09-15 LAB — GLUCOSE, CAPILLARY
Glucose-Capillary: 97 mg/dL (ref 70–99)
Glucose-Capillary: 115 mg/dL — ABNORMAL HIGH (ref 70–99)

## 2012-09-15 MED ORDER — POTASSIUM CHLORIDE CRYS ER 20 MEQ PO TBCR
40.0000 meq | EXTENDED_RELEASE_TABLET | Freq: Once | ORAL | Status: AC
  Administered 2012-09-15: 40 meq via ORAL
  Filled 2012-09-15: qty 2

## 2012-09-15 NOTE — Progress Notes (Signed)
Subjective: Feels ok this morning.  Does not note any GI blood loss despite his lower Hgb level.  A bit bored, but otherwise comfortable  Objective: Vital signs in last 24 hours: Temp:  [97.8 F (36.6 C)-98.4 F (36.9 C)] 98.1 F (36.7 C) (03/29 0553) Pulse Rate:  [73-85] 85 (03/29 0553) Resp:  [20] 20 (03/29 0553) BP: (123-129)/(47-57) 125/47 mmHg (03/29 0553) SpO2:  [90 %-100 %] 96 % (03/29 1610) Weight change:  Last BM Date: 09/12/12  Intake/Output from previous day: 03/28 0701 - 03/29 0700 In: 890 [P.O.:240; IV Piggyback:650] Out: 3325 [Urine:3325] Intake/Output this shift:   General appearance: WM in NAD  Eyes: no scleral icterus, moist conjunctiva  Throat: oropharynx moist without erythema  Resp: CTAB, mild exp wheezes diffusely  Cardio: RRR, no MRG  GI: soft, non-tender; bowel sounds normal; no masses, no organomegaly  Extremities: stasis dermatitis of LLE and RLE wound is well wrapped in guaze, kerlex from the knee to foot. 1+ DP pulses B/L    Lab Results:  Recent Labs  09/13/12 1710 09/14/12 0424  WBC 13.3* 9.8  HGB 9.7* 8.7*  HCT 31.2* 27.7*  PLT 239 258   BMET  Recent Labs  09/13/12 1710 09/14/12 0424  NA 124* 131*  K 3.4* 3.3*  CL 85* 94*  CO2 25 27  GLUCOSE 107* 93  BUN 13 11  CREATININE 0.81 0.68  CALCIUM 9.0 8.5    Studies/Results: No results found.  Medications:  I have reviewed the patient's current medications. Scheduled: . aliskiren  300 mg Oral Daily  . aspirin  81 mg Oral Daily  . atenolol  50 mg Oral Daily  . atorvastatin  80 mg Oral q1800  . enoxaparin (LOVENOX) injection  40 mg Subcutaneous Q24H  . furosemide  40 mg Oral BID  . insulin aspart  0-15 Units Subcutaneous TID WC  . insulin aspart  0-5 Units Subcutaneous QHS  . insulin aspart  3 Units Subcutaneous TID WC  . insulin glargine  10 Units Subcutaneous QHS  . LORazepam  1 mg Oral TID  . pantoprazole  40 mg Oral Daily  . perphenazine  2 mg Oral BID  .  piperacillin-tazobactam (ZOSYN)  IV  3.375 g Intravenous Q8H  . potassium chloride  40 mEq Oral Once  . vancomycin  1,250 mg Intravenous Q12H  . venlafaxine XR  150 mg Oral QHS   And  . venlafaxine XR  75 mg Oral Q breakfast   Continuous:  RUE:[id number]  Assessment/Plan: 62 yo male admitted for failed outpt therapy of lower extremity cellulitis  # Cellulitis/abscess, leg  On vanc and zosyn day 3 per pharm consult  Leukocytosis has resolved  Afebrile and normotensive  Continue current IV abx  Encourage PO hydration. No IVF at this time  Blood cultures pending   His LE doppler does not show evidence of DVT but had areas hard to visualize due to edema. # HYPERLIPIDEMIA  Cont home meds  # HYPERTENSION, BENIGN  cont home meds  #GERD  Cont home meds  # DIABETES MELLITUS, TYPE II  Home regimen plus SSI  Well controlled  Anemia- Hgb slightly lower, will recheck tomorrow and if still lower, d/c Lovenox. Hyponatremia- Improving Hypokalemia- PO replacement given this AM, will follow daily. DVT Prophylaxis - lovenox  Dispo - D/C early next week after improvement on IV abx    LOS: 2 days   Juanluis Guastella W 09/15/2012, 9:27 AM

## 2012-09-16 LAB — CBC
HCT: 27.7 % — ABNORMAL LOW (ref 39.0–52.0)
Hemoglobin: 8.6 g/dL — ABNORMAL LOW (ref 13.0–17.0)
MCV: 69.6 fL — ABNORMAL LOW (ref 78.0–100.0)
Platelets: 344 10*3/uL (ref 150–400)
RBC: 3.98 MIL/uL — ABNORMAL LOW (ref 4.22–5.81)
WBC: 13.7 10*3/uL — ABNORMAL HIGH (ref 4.0–10.5)

## 2012-09-16 LAB — VANCOMYCIN, TROUGH: Vancomycin Tr: 10.9 ug/mL (ref 10.0–20.0)

## 2012-09-16 LAB — COMPREHENSIVE METABOLIC PANEL
AST: 56 U/L — ABNORMAL HIGH (ref 0–37)
CO2: 31 mEq/L (ref 19–32)
Chloride: 92 mEq/L — ABNORMAL LOW (ref 96–112)
Creatinine, Ser: 0.88 mg/dL (ref 0.50–1.35)
GFR calc non Af Amer: >90 mL/min (ref >90)
Total Bilirubin: 0.5 mg/dL (ref 0.3–1.2)

## 2012-09-16 LAB — GLUCOSE, CAPILLARY
Glucose-Capillary: 167 mg/dL — ABNORMAL HIGH (ref 70–99)
Glucose-Capillary: 82 mg/dL (ref 70–99)

## 2012-09-16 NOTE — Progress Notes (Signed)
ANTIBIOTIC CONSULT NOTE - FOLLOW UP  Pharmacy Consult for Vanc/Zosyn Indication: Cellulitis/abscess  Allergies  Allergen Reactions  . Altace (Ramipril)     Dizziness, migraine, weakness  . Codeine Itching  . Naproxen Itching    Patient Measurements: Height: 6' (182.9 cm) Weight: 293 lb 4.8 oz (133.04 kg) IBW/kg (Calculated) : 77.6   Vital Signs: Temp: 98.2 F (36.8 C) (03/30 1355) Temp src: Oral (03/30 1355) BP: 125/69 mmHg (03/30 1355) Pulse Rate: 64 (03/30 1355)  Labs:  Recent Labs  09/14/12 0424 09/16/12 0422  WBC 9.8 13.7*  HGB 8.7* 8.6*  PLT 258 344  CREATININE 0.68 0.88   Estimated Creatinine Clearance: 122.9 ml/min (by C-G formula based on Cr of 0.88).  Recent Labs  09/16/12 1946  VANCOTROUGH 10.9     Microbiology: 3/27 Bcx x2 = NGTD  Anti-infectives   Start     Dose/Rate Route Frequency Ordered Stop   09/14/12 0800  vancomycin (VANCOCIN) 1,250 mg in sodium chloride 0.9 % 250 mL IVPB     1,250 mg 166.7 mL/hr over 90 Minutes Intravenous Every 12 hours 09/13/12 1957     09/13/12 2100  piperacillin-tazobactam (ZOSYN) IVPB 3.375 g     3.375 g 12.5 mL/hr over 240 Minutes Intravenous Every 8 hours 09/13/12 1958     09/13/12 2030  vancomycin (VANCOCIN) 2,500 mg in sodium chloride 0.9 % 500 mL IVPB     2,500 mg 250 mL/hr over 120 Minutes Intravenous  Once 09/13/12 1956 09/14/12 0043      Assessment: 62 yo M with hx DM to be admitted 3/27 with leg cellulitis/abscess that has worsened after starting Augmentin as outpatient. Note patient also with healing DM foot ulcer   D#4 Vancomycin 1250mg  IV q12 (after 2500mg  loading dose)  D#4 Zosyn 3.375g IV q8h, each dose over 4 hours  3/27 >>Vanc >> 3/27 >>Zosyn >>   Tmax: afebrile WBCs: up 13.7 Renal: Scr wnl/Stable, crcl 86N  3/27 blood: ngtd  Dose changes/drug level info:  3/30 Vanco trough = 10.78mcg/ml (prior to 6th dose)   Goal of Therapy:  Vancomycin trough 10-92mcg/ml  Plan:   Continue  vancomycin 1250mg  IV q12h as level is appropriate for skin/soft tissue infection  Juliette Alcide, PharmD, BCPS.   Pager: [phone] 09/16/2012 8:28 PM

## 2012-09-16 NOTE — Progress Notes (Signed)
ANTIBIOTIC CONSULT NOTE - FOLLOW UP  Pharmacy Consult for Vanc/Zosyn Indication: Cellulitis/abscess  Allergies  Allergen Reactions  . Altace (Ramipril)     Dizziness, migraine, weakness  . Codeine Itching  . Naproxen Itching    Patient Measurements: Height: 6' (182.9 cm) Weight: 293 lb 4.8 oz (133.04 kg) IBW/kg (Calculated) : 77.6   Vital Signs: Temp: 97.9 F (36.6 C) (03/30 0500) Temp src: Oral (03/30 0500) BP: 140/60 mmHg (03/30 0500) Pulse Rate: 77 (03/30 0500)  Labs:  Recent Labs  09/13/12 1710 09/14/12 0424 09/16/12 0422  WBC 13.3* 9.8 13.7*  HGB 9.7* 8.7* 8.6*  PLT 239 258 344  CREATININE 0.81 0.68 0.88   Estimated Creatinine Clearance: 122.9 ml/min (by C-G formula based on Cr of 0.88). No results found for this basename: VANCOTROUGH, Leodis Binet, VANCORANDOM, GENTTROUGH, GENTPEAK, GENTRANDOM, TOBRATROUGH, TOBRAPEAK, TOBRARND, AMIKACINPEAK, AMIKACINTROU, AMIKACIN,  in the last 72 hours   Microbiology: 3/27 Bcx x2 = NGTD  Anti-infectives   Start     Dose/Rate Route Frequency Ordered Stop   09/14/12 0800  vancomycin (VANCOCIN) 1,250 mg in sodium chloride 0.9 % 250 mL IVPB     1,250 mg 166.7 mL/hr over 90 Minutes Intravenous Every 12 hours 09/13/12 1957     09/13/12 2100  piperacillin-tazobactam (ZOSYN) IVPB 3.375 g     3.375 g 12.5 mL/hr over 240 Minutes Intravenous Every 8 hours 09/13/12 1958     09/13/12 2030  vancomycin (VANCOCIN) 2,500 mg in sodium chloride 0.9 % 500 mL IVPB     2,500 mg 250 mL/hr over 120 Minutes Intravenous  Once 09/13/12 1956 09/14/12 0043      Assessment: 62 yo M with hx DM to be admitted 3/27 with leg cellulitis/abscess that has worsened after starting Augmentin as outpatient. Note patient also with healing DM foot ulcer   D#4 Vancomycin 1250mg  IV q12 (after 2500mg  loading dose)  D#4 Zosyn 3.375g IV q8h, each dose over 4 hours  Scr wnl/stable, CrCl ~ 30ml/min/1.73m2  WBC elevated, increased from 3/28 (13.3 > 9.8 >  13.7)  Afebrile   Goal of Therapy:  Vancomycin trough level 15-20 mcg/ml  Plan:  No change to Zosyn dose VT today  Gwen Her PharmD  [phone] 09/16/2012 11:08 AM

## 2012-09-16 NOTE — Progress Notes (Signed)
Subjective: Doing well.  No new issues with his leg, which has been redressed.  Objective: Vital signs in last 24 hours: Temp:  [97.9 F (36.6 C)-98.4 F (36.9 C)] 97.9 F (36.6 C) (03/30 0500) Pulse Rate:  [70-78] 77 (03/30 0500) Resp:  [18-24] 18 (03/30 0500) BP: (121-140)/(42-60) 140/60 mmHg (03/30 0500) SpO2:  [94 %-98 %] 96 % (03/30 0500) Weight:  [133.04 kg (293 lb 4.8 oz)] 133.04 kg (293 lb 4.8 oz) (03/30 0500) Weight change:  Last BM Date: 09/12/12  Intake/Output from previous day: 03/29 0701 - 03/30 0700 In: -  Out: 1300 [Urine:1300] Intake/Output this shift:   General appearance: WM in NAD  Eyes: no scleral icterus, moist conjunctiva  Throat: oropharynx moist without erythema  Resp: CTAB, mild exp wheezes diffusely  Cardio: RRR, no MRG  GI: soft, non-tender; bowel sounds normal; no masses, no organomegaly  Extremities: stasis dermatitis of LLE and RLE wound is well wrapped in guaze, kerlex from the knee to foot. 1+ DP pulses B/L    Lab Results:  Recent Labs  09/14/12 0424 09/16/12 0422  WBC 9.8 13.7*  HGB 8.7* 8.6*  HCT 27.7* 27.7*  PLT 258 344   BMET  Recent Labs  09/14/12 0424 09/16/12 0422  NA 131* 131*  K 3.3* 3.9  CL 94* 92*  CO2 27 31  GLUCOSE 93 95  BUN 11 13  CREATININE 0.68 0.88  CALCIUM 8.5 8.4    Studies/Results: No results found.  Medications:  I have reviewed the patient's current medications. Scheduled: . aliskiren  300 mg Oral Daily  . aspirin  81 mg Oral Daily  . atenolol  50 mg Oral Daily  . atorvastatin  80 mg Oral q1800  . enoxaparin (LOVENOX) injection  40 mg Subcutaneous Q24H  . furosemide  40 mg Oral BID  . insulin aspart  0-15 Units Subcutaneous TID WC  . insulin aspart  0-5 Units Subcutaneous QHS  . insulin aspart  3 Units Subcutaneous TID WC  . insulin glargine  10 Units Subcutaneous QHS  . LORazepam  1 mg Oral TID  . pantoprazole  40 mg Oral Daily  . perphenazine  2 mg Oral BID  . piperacillin-tazobactam  (ZOSYN)  IV  3.375 g Intravenous Q8H  . vancomycin  1,250 mg Intravenous Q12H  . venlafaxine XR  150 mg Oral QHS   And  . venlafaxine XR  75 mg Oral Q breakfast   Continuous:  GMW:[id number]  Assessment/Plan: # Cellulitis/abscess, leg  On vanc and zosyn day 4 per pharm consult  Leukocytosis has resolved  Afebrile and normotensive  Continue current IV abx  Encourage PO hydration. No IVF at this time  Blood cultures pending His LE doppler does not show evidence of DVT but had areas hard to visualize due to edema.  # HYPERLIPIDEMIA  Cont home meds  # HYPERTENSION, BENIGN  cont home meds  #GERD  Cont home meds  # DIABETES MELLITUS, TYPE II  Home regimen plus SSI  Well controlled  Anemia- Hgb slightly lower, will recheck tomorrow and if still lower, d/c Lovenox.  Hyponatremia- Improving  Hypokalemia- PO replacement given yesterday, no [name] normal. DVT Prophylaxis - lovenox  Dispo - D/C early next week after improvement on IV abx    LOS: 3 days   Jordan [name] 09/16/2012, 8:44 AM

## 2012-09-17 LAB — GLUCOSE, CAPILLARY: Glucose-Capillary: 122 mg/dL — ABNORMAL HIGH (ref 70–99)

## 2012-09-17 LAB — CBC
Hemoglobin: 9 g/dL — ABNORMAL LOW (ref 13.0–17.0)
MCHC: 30.1 g/dL (ref 30.0–36.0)
Platelets: 382 10*3/uL (ref 150–400)
RDW: 19.6 % — ABNORMAL HIGH (ref 11.5–15.5)

## 2012-09-17 LAB — COMPREHENSIVE METABOLIC PANEL
ALT: 47 U/L (ref 0–53)
AST: 68 U/L — ABNORMAL HIGH (ref 0–37)
Albumin: 2.1 g/dL — ABNORMAL LOW (ref 3.5–5.2)
Alkaline Phosphatase: 138 U/L — ABNORMAL HIGH (ref 39–117)
Potassium: 4.1 mEq/L (ref 3.5–5.1)
Sodium: 134 mEq/L — ABNORMAL LOW (ref 135–145)
Total Protein: 6.7 g/dL (ref 6.0–8.3)

## 2012-09-17 MED ORDER — DOXYCYCLINE HYCLATE 100 MG PO TABS: 100.0000 mg | ORAL_TABLET | Freq: Two times a day (BID) | ORAL | Status: DC

## 2012-09-17 NOTE — Discharge Summary (Signed)
DISCHARGE SUMMARY  Jordan [name]  MR#: [id number]  DOB:11-13-1950  Date of Admission: 09/13/2012 Date of Discharge: 09/17/2012  Attending Physician:Jordan [name]  Patient's FAO:ZHYQM, Jordan [name]  Consults:  none  Discharge Diagnoses: Principal Problem:   Cellulitis of leg, left DM Type 2 Hypertension Hyperlipidemia  Anemia depression Hyponatremia Abnormal LFT GERD Unstable gait Chronic venous stasis  Discharge Medications:   Medication List    STOP taking these medications       amoxicillin-clavulanate 875-125 MG per tablet  Commonly known as:  AUGMENTIN     cefTRIAXone 1 G injection  Commonly known as:  ROCEPHIN     pioglitazone 45 MG tablet  Commonly known as:  ACTOS      TAKE these medications       aliskiren 300 MG tablet  Commonly known as:  TEKTURNA  Take 300 mg by mouth daily.     atenolol 50 MG tablet  Commonly known as:  TENORMIN  Take 50 mg by mouth daily.     atorvastatin 80 MG tablet  Commonly known as:  LIPITOR  Take 80 mg by mouth daily.     butalbital-acetaminophen-caffeine 50-325-40 MG per tablet  Commonly known as:  FIORICET, ESGIC  Take 1 tablet by mouth 2 (two) times daily as needed for pain or headache.     doxycycline 100 MG tablet  Commonly known as:  VIBRA-TABS  Take 1 tablet (100 mg total) by mouth every 12 (twelve) hours.  Start taking on:  09/18/2012     furosemide 40 MG tablet  Commonly known as:  LASIX  Take 40 mg by mouth daily.     iron polysaccharides 150 MG capsule  Commonly known as:  NIFEREX  Take 150 mg by mouth daily.     LORazepam 1 MG tablet  Commonly known as:  ATIVAN  Take 1 mg by mouth every 8 (eight) hours as needed for anxiety.     metFORMIN 500 MG tablet  Commonly known as:  GLUCOPHAGE  Take 500 mg by mouth daily.     methocarbamol 500 MG tablet  Commonly known as:  ROBAXIN  Take 500 mg by mouth 4 (four) times daily as needed (for muscle spasm.).     multivitamin with minerals Tabs   Take 1 tablet by mouth daily.     omeprazole 20 MG capsule  Commonly known as:  PRILOSEC  Take 20 mg by mouth daily.     perphenazine 2 MG tablet  Commonly known as:  TRILAFON  Take 2 mg by mouth daily.     promethazine 25 MG tablet  Commonly known as:  PHENERGAN  Take 25 mg by mouth every 4 (four) hours as needed for nausea.     venlafaxine XR 75 MG 24 hr capsule  Commonly known as:  EFFEXOR-XR  Take 75-150 mg by mouth 2 (two) times daily. 1 cap in am, 2 caps in pm.        Hospital Procedures: No results found.  History of Present Illness: Cellulitis of leg  Hospital Course: 62 YO WM with venous insufficiency. Being treated for cellulitis as an outpatient. Leg became more acutely swollen and red with fever and ascending cellulitis. Admitted for IV abx. Pt has done well overall. The leg has reduced in size by about 30%. No fevers in the past 48 hrs. No chills or sweats. Eating well. No pain. No other issues. Anemia will need to be addressed more as an outpt. Pt is clinically stable enough for PO  antibiotics. Will need LFT's checked as well Day of Discharge Exam BP 131/54  Pulse 75  Temp(Src) 98.3 F (36.8 C) (Oral)  Resp 20  Ht 6' (1.829 m)  Wt 128.323 kg (282 lb 14.4 oz)  BMI 38.36 kg/m2  SpO2 94%  Physical Exam: General appearance: alert in no distress. Face symmetric.   Resp: clear to auscultation bilaterally Cardio: regular rate and rhythm with sys murmur GI: soft, non-tender; bowel sounds normal; no masses,  no organomegaly Extremities:2+ edema, intact pulses, less red, much less weeping, less warm Awake, alert with clear speech, mentating well, unstable gait  Discharge Labs:  Recent Labs  09/16/12 0422 09/17/12 0350  NA 131* 134*  K 3.9 4.1  CL 92* 95*  CO2 31 32  GLUCOSE 95 93  BUN 13 12  CREATININE 0.88 0.82  CALCIUM 8.4 8.4    Recent Labs  09/16/12 0422 09/17/12 0350  AST 56* 68*  ALT 36 47  ALKPHOS 132* 138*  BILITOT 0.5 0.6  PROT  6.3 6.7  ALBUMIN 2.1* 2.1*    Recent Labs  09/16/12 0422 09/17/12 0350  WBC 13.7* 11.7*  HGB 8.6* 9.0*  HCT 27.7* 29.9*  MCV 69.6* 70.0*  PLT 344 382    09/13/2012 21:54 Culture BLOOD CULTURE RECEIVED NO GROWTH TO DATE CULTURE WILL BE HELD FOR 5 DAYS BEFORE ISSUING A FINAL NEGATIVE REPORT LE Doppler - No evidence of deep vein thrombosis involving the visualized veins of the right lower extremity. - No evidence of Baker's cyst on the right.   2014-03-28T15:[id number]   Discharge instructions: wrap leg daily   Disposition: home  Follow-up Appts: To see Dr Jordan [name] in a week  Condition on Discharge: 1 week  Tests Needing Follow-up: none  Signed: Theodis Kinsel [name] 09/17/2012, 9:28 AM

## 2012-09-17 NOTE — Progress Notes (Signed)
Discharge instructions given, no questions.  Left via wheelchair to own car in parking lot to drive himself home.  Dr. Evlyn Kanner aware and had no problems with patient driving himself home.  Edison Nasuti

## 2012-09-19 LAB — CULTURE, BLOOD (ROUTINE X 2)

## 2012-10-09 ENCOUNTER — Encounter (HOSPITAL_BASED_OUTPATIENT_CLINIC_OR_DEPARTMENT_OTHER): Payer: Medicare Other | Attending: General Surgery

## 2012-10-09 DIAGNOSIS — I10 Essential (primary) hypertension: Secondary | ICD-10-CM | POA: Insufficient documentation

## 2012-10-09 DIAGNOSIS — L538 Other specified erythematous conditions: Secondary | ICD-10-CM | POA: Insufficient documentation

## 2012-10-09 DIAGNOSIS — I872 Venous insufficiency (chronic) (peripheral): Secondary | ICD-10-CM | POA: Insufficient documentation

## 2012-10-09 DIAGNOSIS — Z79899 Other long term (current) drug therapy: Secondary | ICD-10-CM | POA: Insufficient documentation

## 2012-10-09 DIAGNOSIS — L97909 Non-pressure chronic ulcer of unspecified part of unspecified lower leg with unspecified severity: Secondary | ICD-10-CM | POA: Insufficient documentation

## 2012-10-09 DIAGNOSIS — E785 Hyperlipidemia, unspecified: Secondary | ICD-10-CM | POA: Insufficient documentation

## 2012-10-09 DIAGNOSIS — E119 Type 2 diabetes mellitus without complications: Secondary | ICD-10-CM | POA: Insufficient documentation

## 2012-10-10 NOTE — H&P (Signed)
NAME:  Jordan [name], FRANKSON NO.:  [id number]  MEDICAL RECORD NO.:  [id number]  LOCATION:  FOOT                         FACILITY:  [facility]  PHYSICIAN:  Joanne Gavel, M.D.        DATE OF BIRTH:  1951/03/09  DATE OF ADMISSION:  10/09/2012 DATE OF DISCHARGE:                             HISTORY & PHYSICAL   CHIEF COMPLAINT:  Wounds, right leg.  HISTORY OF PRESENT ILLNESS:  This is a 62 year old male with diabetes who had episodes of venous stasis and ulcer disease 6-8 years ago.  He developed cellulitis of the right leg.  This was associated with a great deal of swelling.  He was seen by his primary care physician, Dr. Stephan Minister, and treated first with oral antibiotics and then admitted to the hospital for 4 days for intravenous antibiotics.  During this time, there was a great deal of swelling and the patient started leakage from various sites in his right lower extremity.  PAST MEDICAL HISTORY:  Significant for hypertension, diabetes, hyperlipidemia, anemia, hyponatremia, abnormal liver function tests, GERD, and chronic venous stasis.  PAST SURGICAL HISTORY:  Left ankle surgery, knee surgery, and tonsillectomy as a teenager.  ALLERGIES:  Altace, Naprosyn, and codeine.  MEDICATIONS:  Atenolol, Ativan, __________, pioglitazone, Effexor, Lipitor, Lasix, perphenazine, butalb, metformin, Prilosec, and Coricidin.  REVIEW OF SYSTEMS:  The patient denies heart, lung, and kidney disease.  PHYSICAL EXAMINATION:  VITAL SIGNS:  Temperature 98, pulse 76, respirations 16, blood pressure 127/68. GENERAL APPEARANCE:  Well developed, obese, in no distress. CHEST:  Clear. HEART:  Regular rhythm. EXTREMITIES:  Examination of the right lower extremity reveals a great deal of desquamation and chronic venous stasis.  The dorsalis pedis pulses weakly palpable.  There are several confluent very superficial wounds covered with adherent slough all over the right lower extremity. On  the left lower extremity, there is a great deal of desquamation and chronic venous stasis dermatitis by the way he has had a venous study.  IMPRESSION:  Chronic venous hypertension with ulceration, right lower extremity; diabetes mellitus; and rule out arterial peripheral vascular disease.  PLAN OF TREATMENT:  We will start with every-other-day dressing changes with Kerlix and Coban.  I want to see if we can get some of the slough off the lower extremity before we go to dressings with higher external pressure.  We will see him in 7 days.     Joanne Gavel, M.D.     RA/MEDQ  D:  10/09/2012  T:  10/10/2012  Job:  [id number]  cc:   Jeannett Senior A. Evlyn Kanner, M.D.

## 2012-10-23 ENCOUNTER — Encounter (HOSPITAL_BASED_OUTPATIENT_CLINIC_OR_DEPARTMENT_OTHER): Payer: Medicare Other | Attending: General Surgery

## 2012-10-23 DIAGNOSIS — B9689 Other specified bacterial agents as the cause of diseases classified elsewhere: Secondary | ICD-10-CM | POA: Insufficient documentation

## 2012-10-23 DIAGNOSIS — L97909 Non-pressure chronic ulcer of unspecified part of unspecified lower leg with unspecified severity: Secondary | ICD-10-CM | POA: Insufficient documentation

## 2012-10-23 DIAGNOSIS — I87319 Chronic venous hypertension (idiopathic) with ulcer of unspecified lower extremity: Secondary | ICD-10-CM | POA: Insufficient documentation

## 2012-11-14 ENCOUNTER — Encounter (INDEPENDENT_AMBULATORY_CARE_PROVIDER_SITE_OTHER): Payer: Medicare Other | Admitting: Vascular Surgery

## 2012-11-14 DIAGNOSIS — I739 Peripheral vascular disease, unspecified: Secondary | ICD-10-CM

## 2012-11-20 ENCOUNTER — Encounter (HOSPITAL_BASED_OUTPATIENT_CLINIC_OR_DEPARTMENT_OTHER): Payer: Medicare Other | Attending: General Surgery

## 2012-11-20 DIAGNOSIS — I87319 Chronic venous hypertension (idiopathic) with ulcer of unspecified lower extremity: Secondary | ICD-10-CM | POA: Insufficient documentation

## 2012-11-20 DIAGNOSIS — L97809 Non-pressure chronic ulcer of other part of unspecified lower leg with unspecified severity: Secondary | ICD-10-CM | POA: Insufficient documentation

## 2012-11-20 DIAGNOSIS — L97909 Non-pressure chronic ulcer of unspecified part of unspecified lower leg with unspecified severity: Secondary | ICD-10-CM | POA: Insufficient documentation

## 2012-12-18 ENCOUNTER — Encounter (HOSPITAL_BASED_OUTPATIENT_CLINIC_OR_DEPARTMENT_OTHER): Payer: Medicare Other | Attending: General Surgery

## 2012-12-18 DIAGNOSIS — I872 Venous insufficiency (chronic) (peripheral): Secondary | ICD-10-CM | POA: Insufficient documentation

## 2012-12-18 DIAGNOSIS — E119 Type 2 diabetes mellitus without complications: Secondary | ICD-10-CM | POA: Insufficient documentation

## 2012-12-18 DIAGNOSIS — L97909 Non-pressure chronic ulcer of unspecified part of unspecified lower leg with unspecified severity: Secondary | ICD-10-CM | POA: Insufficient documentation

## 2013-02-05 ENCOUNTER — Encounter (HOSPITAL_BASED_OUTPATIENT_CLINIC_OR_DEPARTMENT_OTHER): Payer: Medicare Other | Attending: General Surgery

## 2013-02-05 DIAGNOSIS — L02818 Cutaneous abscess of other sites: Secondary | ICD-10-CM | POA: Diagnosis not present

## 2013-02-05 DIAGNOSIS — L97509 Non-pressure chronic ulcer of other part of unspecified foot with unspecified severity: Secondary | ICD-10-CM | POA: Diagnosis not present

## 2013-02-05 DIAGNOSIS — E1169 Type 2 diabetes mellitus with other specified complication: Secondary | ICD-10-CM | POA: Diagnosis not present

## 2013-02-12 DIAGNOSIS — E1169 Type 2 diabetes mellitus with other specified complication: Secondary | ICD-10-CM | POA: Diagnosis not present

## 2013-02-12 DIAGNOSIS — L97509 Non-pressure chronic ulcer of other part of unspecified foot with unspecified severity: Secondary | ICD-10-CM | POA: Diagnosis not present

## 2013-02-12 DIAGNOSIS — L02818 Cutaneous abscess of other sites: Secondary | ICD-10-CM | POA: Diagnosis not present

## 2013-02-19 ENCOUNTER — Encounter (HOSPITAL_BASED_OUTPATIENT_CLINIC_OR_DEPARTMENT_OTHER): Payer: Medicare Other | Attending: General Surgery

## 2013-02-19 DIAGNOSIS — I87319 Chronic venous hypertension (idiopathic) with ulcer of unspecified lower extremity: Secondary | ICD-10-CM | POA: Insufficient documentation

## 2013-02-19 DIAGNOSIS — L97909 Non-pressure chronic ulcer of unspecified part of unspecified lower leg with unspecified severity: Secondary | ICD-10-CM | POA: Insufficient documentation

## 2013-02-19 DIAGNOSIS — L97809 Non-pressure chronic ulcer of other part of unspecified lower leg with unspecified severity: Secondary | ICD-10-CM | POA: Diagnosis not present

## 2013-02-26 DIAGNOSIS — L97809 Non-pressure chronic ulcer of other part of unspecified lower leg with unspecified severity: Secondary | ICD-10-CM | POA: Diagnosis not present

## 2013-02-26 DIAGNOSIS — I87319 Chronic venous hypertension (idiopathic) with ulcer of unspecified lower extremity: Secondary | ICD-10-CM | POA: Diagnosis not present

## 2013-03-05 DIAGNOSIS — L97809 Non-pressure chronic ulcer of other part of unspecified lower leg with unspecified severity: Secondary | ICD-10-CM | POA: Diagnosis not present

## 2013-03-05 DIAGNOSIS — I87319 Chronic venous hypertension (idiopathic) with ulcer of unspecified lower extremity: Secondary | ICD-10-CM | POA: Diagnosis not present

## 2013-03-12 DIAGNOSIS — L97809 Non-pressure chronic ulcer of other part of unspecified lower leg with unspecified severity: Secondary | ICD-10-CM | POA: Diagnosis not present

## 2013-03-12 DIAGNOSIS — I87319 Chronic venous hypertension (idiopathic) with ulcer of unspecified lower extremity: Secondary | ICD-10-CM | POA: Diagnosis not present

## 2013-03-19 DIAGNOSIS — L97809 Non-pressure chronic ulcer of other part of unspecified lower leg with unspecified severity: Secondary | ICD-10-CM | POA: Diagnosis not present

## 2013-03-19 DIAGNOSIS — I87319 Chronic venous hypertension (idiopathic) with ulcer of unspecified lower extremity: Secondary | ICD-10-CM | POA: Diagnosis not present

## 2013-03-26 ENCOUNTER — Encounter (HOSPITAL_BASED_OUTPATIENT_CLINIC_OR_DEPARTMENT_OTHER): Payer: Medicare Other | Attending: General Surgery

## 2013-03-26 DIAGNOSIS — L97909 Non-pressure chronic ulcer of unspecified part of unspecified lower leg with unspecified severity: Secondary | ICD-10-CM | POA: Insufficient documentation

## 2013-03-26 DIAGNOSIS — I87319 Chronic venous hypertension (idiopathic) with ulcer of unspecified lower extremity: Secondary | ICD-10-CM | POA: Insufficient documentation

## 2013-05-21 ENCOUNTER — Encounter (HOSPITAL_BASED_OUTPATIENT_CLINIC_OR_DEPARTMENT_OTHER): Payer: Medicare Other | Attending: General Surgery

## 2013-05-21 DIAGNOSIS — Z87891 Personal history of nicotine dependence: Secondary | ICD-10-CM | POA: Insufficient documentation

## 2013-05-21 DIAGNOSIS — I87319 Chronic venous hypertension (idiopathic) with ulcer of unspecified lower extremity: Secondary | ICD-10-CM | POA: Insufficient documentation

## 2013-05-21 DIAGNOSIS — I10 Essential (primary) hypertension: Secondary | ICD-10-CM | POA: Insufficient documentation

## 2013-05-21 DIAGNOSIS — E785 Hyperlipidemia, unspecified: Secondary | ICD-10-CM | POA: Insufficient documentation

## 2013-05-21 DIAGNOSIS — L84 Corns and callosities: Secondary | ICD-10-CM | POA: Insufficient documentation

## 2013-05-21 DIAGNOSIS — L97909 Non-pressure chronic ulcer of unspecified part of unspecified lower leg with unspecified severity: Secondary | ICD-10-CM | POA: Insufficient documentation

## 2013-05-21 DIAGNOSIS — F329 Major depressive disorder, single episode, unspecified: Secondary | ICD-10-CM | POA: Insufficient documentation

## 2013-05-21 DIAGNOSIS — Z79899 Other long term (current) drug therapy: Secondary | ICD-10-CM | POA: Insufficient documentation

## 2013-05-21 DIAGNOSIS — K219 Gastro-esophageal reflux disease without esophagitis: Secondary | ICD-10-CM | POA: Insufficient documentation

## 2013-05-21 DIAGNOSIS — L97509 Non-pressure chronic ulcer of other part of unspecified foot with unspecified severity: Secondary | ICD-10-CM | POA: Insufficient documentation

## 2013-05-21 DIAGNOSIS — F3289 Other specified depressive episodes: Secondary | ICD-10-CM | POA: Insufficient documentation

## 2013-05-21 DIAGNOSIS — E1169 Type 2 diabetes mellitus with other specified complication: Secondary | ICD-10-CM | POA: Insufficient documentation

## 2013-05-22 NOTE — H&P (Signed)
NAME:  Jordan [name], Jordan [name] NO.:  [id number]  MEDICAL RECORD NO.:  [id number]  LOCATION:  FOOT                         FACILITY:  [facility]  PHYSICIAN:  Joanne Gavel, M.D.        DATE OF BIRTH:  1951/02/17  DATE OF ADMISSION:  05/21/2013 DATE OF DISCHARGE:                             HISTORY & PHYSICAL   CHIEF COMPLAINT:  Wounds, both legs.  HISTORY OF PRESENT ILLNESS:  This is a 62 year old male, recently discharged from the clinic with chronic venous hypertension and bilateral leg ulcers, approximately 3 weeks ago, drainage started again. He also has pain and discomfort in his right great toe in addition to great deal of callus formation.  PAST MEDICAL HISTORY:  Significant for hypertension, cellulitis, diabetes, hyperlipidemia, anemia, depression, abnormal LFTs, GERD, chronic venous stasis.  PAST SURGICAL HISTORY:  Ankle repair, arthroscopic knee surgery, and tonsillectomy.  SOCIAL HISTORY:  Cigarettes, he was a daily smoker until approximately 6 months ago.  He has not had any cigarettes since then.  Alcohol none.  MEDICATIONS:  Atenolol, Ativan, [place]turn[place], pioglitazone, Effexor, Lipitor, Lasix, perphenazine, metformin, aspirin, Prilosec, Coricidin, and __________.  ALLERGIES:  ALTACE, NAPROSYN, and CODEINE.  REVIEW OF SYSTEMS:  As above.  PHYSICAL EXAMINATION:  VITAL SIGNS:  Temperature 98, pulse 100, respirations 16, blood pressure 115/60. GENERAL APPEARANCE:  A well developed, obese in no distress. CHEST:  Clear. HEART:  Regular rhythm. LOWER EXTREMITIES:  Reveals an ABI of 0.85 on the right and 0.84 on the left.  There is a sizable callus on the right great toe.  After excision, there is a tiny pinhole lesion on the plantar surface of the foot.  There are multiple areas of drainage of both legs.  Both legs were great deal swollen, the left leg is somewhat redder than the right, and with some tenderness.  IMPRESSION:  Chronic venous hypertension with  bilateral leg ulceration, possible early cellulitis, diabetic foot ulcer, Wagner 2, right great toe.  PLAN OF TREATMENT:  We will treat the patient with the Profore Lite, oral antibiotics, in this case Cipro 500 b.i.d., since the toe wound is very tiny and clean after debridement, we will put the silver alginate over that wound.  We will see him in 7 days.  He has been warned to take his temperature 4 times a day, and to go to the emergency room, if he develops any systemic symptoms of fevers.     Joanne Gavel, M.D.     RA/MEDQ  D:  05/21/2013  T:  05/22/2013  Job:  [id number]

## 2013-06-25 ENCOUNTER — Encounter (HOSPITAL_BASED_OUTPATIENT_CLINIC_OR_DEPARTMENT_OTHER): Payer: 59 | Attending: General Surgery

## 2013-06-25 DIAGNOSIS — E1169 Type 2 diabetes mellitus with other specified complication: Secondary | ICD-10-CM | POA: Insufficient documentation

## 2013-06-25 DIAGNOSIS — L97509 Non-pressure chronic ulcer of other part of unspecified foot with unspecified severity: Secondary | ICD-10-CM | POA: Insufficient documentation

## 2013-06-25 DIAGNOSIS — L97909 Non-pressure chronic ulcer of unspecified part of unspecified lower leg with unspecified severity: Principal | ICD-10-CM

## 2013-06-25 DIAGNOSIS — L84 Corns and callosities: Secondary | ICD-10-CM | POA: Insufficient documentation

## 2013-06-25 DIAGNOSIS — I87339 Chronic venous hypertension (idiopathic) with ulcer and inflammation of unspecified lower extremity: Secondary | ICD-10-CM | POA: Insufficient documentation

## 2013-07-23 ENCOUNTER — Encounter (HOSPITAL_BASED_OUTPATIENT_CLINIC_OR_DEPARTMENT_OTHER): Payer: Medicare Other | Attending: General Surgery

## 2013-07-23 DIAGNOSIS — L84 Corns and callosities: Secondary | ICD-10-CM | POA: Insufficient documentation

## 2013-07-23 DIAGNOSIS — E1169 Type 2 diabetes mellitus with other specified complication: Secondary | ICD-10-CM | POA: Insufficient documentation

## 2013-07-23 DIAGNOSIS — I87339 Chronic venous hypertension (idiopathic) with ulcer and inflammation of unspecified lower extremity: Secondary | ICD-10-CM | POA: Insufficient documentation

## 2013-07-23 DIAGNOSIS — L97909 Non-pressure chronic ulcer of unspecified part of unspecified lower leg with unspecified severity: Secondary | ICD-10-CM

## 2013-07-23 DIAGNOSIS — L97509 Non-pressure chronic ulcer of other part of unspecified foot with unspecified severity: Secondary | ICD-10-CM | POA: Insufficient documentation

## 2013-08-20 ENCOUNTER — Other Ambulatory Visit (HOSPITAL_BASED_OUTPATIENT_CLINIC_OR_DEPARTMENT_OTHER): Payer: Self-pay | Admitting: General Surgery

## 2013-08-20 ENCOUNTER — Encounter (HOSPITAL_BASED_OUTPATIENT_CLINIC_OR_DEPARTMENT_OTHER): Payer: Medicare Other | Attending: General Surgery

## 2013-08-20 ENCOUNTER — Ambulatory Visit (HOSPITAL_COMMUNITY)
Admission: RE | Admit: 2013-08-20 | Discharge: 2013-08-20 | Disposition: A | Discharge status: Home / Self Care | Payer: Medicare Other | Source: Ambulatory Visit | Attending: General Surgery | Admitting: General Surgery

## 2013-08-20 DIAGNOSIS — E1169 Type 2 diabetes mellitus with other specified complication: Secondary | ICD-10-CM | POA: Insufficient documentation

## 2013-08-20 DIAGNOSIS — L97509 Non-pressure chronic ulcer of other part of unspecified foot with unspecified severity: Secondary | ICD-10-CM | POA: Insufficient documentation

## 2013-08-20 DIAGNOSIS — M869 Osteomyelitis, unspecified: Secondary | ICD-10-CM

## 2013-08-20 DIAGNOSIS — L84 Corns and callosities: Secondary | ICD-10-CM | POA: Insufficient documentation

## 2013-08-20 DIAGNOSIS — M259 Joint disorder, unspecified: Secondary | ICD-10-CM | POA: Insufficient documentation

## 2013-09-24 ENCOUNTER — Encounter (HOSPITAL_BASED_OUTPATIENT_CLINIC_OR_DEPARTMENT_OTHER): Payer: Medicare Other | Attending: General Surgery

## 2013-09-24 DIAGNOSIS — E1169 Type 2 diabetes mellitus with other specified complication: Secondary | ICD-10-CM | POA: Insufficient documentation

## 2013-09-24 DIAGNOSIS — L97509 Non-pressure chronic ulcer of other part of unspecified foot with unspecified severity: Secondary | ICD-10-CM | POA: Insufficient documentation

## 2013-10-22 ENCOUNTER — Encounter (HOSPITAL_BASED_OUTPATIENT_CLINIC_OR_DEPARTMENT_OTHER): Payer: Medicare Other | Attending: General Surgery

## 2013-10-22 DIAGNOSIS — L84 Corns and callosities: Secondary | ICD-10-CM | POA: Insufficient documentation

## 2013-10-22 DIAGNOSIS — E1169 Type 2 diabetes mellitus with other specified complication: Secondary | ICD-10-CM | POA: Insufficient documentation

## 2013-10-22 DIAGNOSIS — L97509 Non-pressure chronic ulcer of other part of unspecified foot with unspecified severity: Secondary | ICD-10-CM | POA: Insufficient documentation

## 2013-11-19 ENCOUNTER — Encounter (HOSPITAL_BASED_OUTPATIENT_CLINIC_OR_DEPARTMENT_OTHER): Payer: Medicare Other | Attending: General Surgery

## 2013-11-19 DIAGNOSIS — L84 Corns and callosities: Secondary | ICD-10-CM | POA: Insufficient documentation

## 2013-11-19 DIAGNOSIS — L97509 Non-pressure chronic ulcer of other part of unspecified foot with unspecified severity: Secondary | ICD-10-CM | POA: Insufficient documentation

## 2013-11-19 DIAGNOSIS — E1169 Type 2 diabetes mellitus with other specified complication: Secondary | ICD-10-CM | POA: Insufficient documentation

## 2013-11-29 ENCOUNTER — Other Ambulatory Visit: Payer: Self-pay | Admitting: *Deleted

## 2013-11-29 DIAGNOSIS — L98499 Non-pressure chronic ulcer of skin of other sites with unspecified severity: Principal | ICD-10-CM

## 2013-11-29 DIAGNOSIS — I739 Peripheral vascular disease, unspecified: Secondary | ICD-10-CM

## 2013-12-10 ENCOUNTER — Encounter: Payer: Self-pay | Admitting: Physician Assistant

## 2013-12-16 ENCOUNTER — Other Ambulatory Visit (HOSPITAL_COMMUNITY): Payer: Medicare Other

## 2013-12-16 ENCOUNTER — Encounter (HOSPITAL_COMMUNITY): Payer: Medicare Other

## 2013-12-16 ENCOUNTER — Encounter: Payer: Medicare Other | Admitting: Surgery

## 2013-12-17 ENCOUNTER — Ambulatory Visit: Payer: Medicare Other | Admitting: Physician Assistant

## 2013-12-24 ENCOUNTER — Encounter (HOSPITAL_BASED_OUTPATIENT_CLINIC_OR_DEPARTMENT_OTHER): Payer: Medicare Other | Attending: General Surgery

## 2013-12-24 DIAGNOSIS — L97509 Non-pressure chronic ulcer of other part of unspecified foot with unspecified severity: Secondary | ICD-10-CM | POA: Insufficient documentation

## 2013-12-24 DIAGNOSIS — E1169 Type 2 diabetes mellitus with other specified complication: Secondary | ICD-10-CM | POA: Insufficient documentation

## 2013-12-31 ENCOUNTER — Ambulatory Visit (INDEPENDENT_AMBULATORY_CARE_PROVIDER_SITE_OTHER): Payer: Medicare Other | Admitting: Physician Assistant

## 2013-12-31 ENCOUNTER — Other Ambulatory Visit (INDEPENDENT_AMBULATORY_CARE_PROVIDER_SITE_OTHER): Payer: Medicare Other

## 2013-12-31 ENCOUNTER — Encounter: Payer: Self-pay | Admitting: Physician Assistant

## 2013-12-31 ENCOUNTER — Encounter: Payer: Self-pay | Admitting: Internal Medicine

## 2013-12-31 VITALS — BP 160/60 | HR 70 | Ht 72.0 in | Wt 277.2 lb

## 2013-12-31 DIAGNOSIS — E119 Type 2 diabetes mellitus without complications: Secondary | ICD-10-CM | POA: Insufficient documentation

## 2013-12-31 DIAGNOSIS — Z8673 Personal history of transient ischemic attack (TIA), and cerebral infarction without residual deficits: Secondary | ICD-10-CM

## 2013-12-31 DIAGNOSIS — E785 Hyperlipidemia, unspecified: Secondary | ICD-10-CM | POA: Insufficient documentation

## 2013-12-31 DIAGNOSIS — D509 Iron deficiency anemia, unspecified: Secondary | ICD-10-CM

## 2013-12-31 DIAGNOSIS — E1369 Other specified diabetes mellitus with other specified complication: Secondary | ICD-10-CM

## 2013-12-31 DIAGNOSIS — L97509 Non-pressure chronic ulcer of other part of unspecified foot with unspecified severity: Secondary | ICD-10-CM | POA: Diagnosis not present

## 2013-12-31 DIAGNOSIS — E0865 Diabetes mellitus due to underlying condition with hyperglycemia: Secondary | ICD-10-CM

## 2013-12-31 DIAGNOSIS — E1169 Type 2 diabetes mellitus with other specified complication: Secondary | ICD-10-CM | POA: Diagnosis not present

## 2013-12-31 LAB — IBC PANEL
Iron: 20 ug/dL — ABNORMAL LOW (ref 42–165)
SATURATION RATIOS: 5 % — AB (ref 20.0–50.0)
TRANSFERRIN: 285.2 mg/dL (ref 212.0–360.0)

## 2013-12-31 LAB — FERRITIN: FERRITIN: 7.8 ng/mL — AB (ref 22.0–322.0)

## 2013-12-31 MED ORDER — MOVIPREP 100 G PO SOLR: 1.0000 | ORAL | Status: DC

## 2013-12-31 NOTE — Progress Notes (Signed)
Subjective:    Patient ID: Jordan [name], male    DOB: 11-11-1950, 63 y.o.   MRN: [id number]  HPI  Jordan [name] is a pleasant 63 year old white male new to GI today referred by Dr. Forde Dandy for evaluation of iron deficiency anemia. Patient reports that he did have a prior colonoscopy done around 2010 in [place] but does not remember who did the procedure. He had recent lab work done as part of his physical and this showed a WBC of 9.1 hemoglobin 10 hematocrit of 31.8 MCV of 66.6 platelets 283 electrolytes within normal limits, liver function studies were within normal limits a do not see iron studies or Hemoccults. Patient has no current GI complaints, he says his appetite is good he purposely has lost 30 pounds over the past year has no complaints of heartburn or indigestion or dysphagia. No abdominal pain changes in his bowel habits, melena or hematochezia. Family history is negative for colon cancer polyps. Patient does have a lot of medical issues and says he's been stressed out recently because of some of his health issues with a wound on his lower extremity, or and some personal issues. He does have prior history of remote TIA, he is maintained on a baby aspirin daily no other blood thinners. Other medical problems include adult onset diabetes mellitus, hyperlipidemia, hypertension, and obesity.    Review of Systems  Constitutional: Negative.   HENT: Negative.   Eyes: Negative.   Respiratory: Negative.   Cardiovascular: Negative.   Gastrointestinal: Negative.   Endocrine: Negative.   Genitourinary: Negative.   Musculoskeletal: Positive for gait problem and joint swelling.  Skin: Positive for wound.  Allergic/Immunologic: Negative.   Neurological: Negative.   Hematological: Negative.   Psychiatric/Behavioral: Negative.    Outpatient Prescriptions Prior to Visit  Medication Sig Dispense Refill  . aliskiren (TEKTURNA) 300 MG tablet Take 300 mg by mouth daily.      Marland Kitchen atenolol  (TENORMIN) 50 MG tablet Take 50 mg by mouth daily.      Marland Kitchen atorvastatin (LIPITOR) 80 MG tablet Take 80 mg by mouth daily.      . butalbital-acetaminophen-caffeine (FIORICET, ESGIC) 50-325-40 MG per tablet Take 1 tablet by mouth 2 (two) times daily as needed for pain or headache.      . furosemide (LASIX) 40 MG tablet Take 40 mg by mouth as needed.       Marland Kitchen LORazepam (ATIVAN) 1 MG tablet Take 1 mg by mouth every 8 (eight) hours as needed for anxiety.      . metFORMIN (GLUCOPHAGE) 500 MG tablet Take 500 mg by mouth daily.      . methocarbamol (ROBAXIN) 500 MG tablet Take 500 mg by mouth 4 (four) times daily as needed (for muscle spasm.).      Marland Kitchen Multiple Vitamin (MULTIVITAMIN WITH MINERALS) TABS Take 1 tablet by mouth daily.      Marland Kitchen omeprazole (PRILOSEC) 20 MG capsule Take 20 mg by mouth daily.      Marland Kitchen perphenazine (TRILAFON) 2 MG tablet Take 2 mg by mouth daily.      . promethazine (PHENERGAN) 25 MG tablet Take 25 mg by mouth every 4 (four) hours as needed for nausea.      Marland Kitchen venlafaxine XR (EFFEXOR-XR) 75 MG 24 hr capsule Take 75-150 mg by mouth 2 (two) times daily. 1 cap in am, 2 caps in pm.      . doxycycline (VIBRA-TABS) 100 MG tablet Take 1 tablet (100 mg total) by mouth every 12 (  twelve) hours.  14 tablet  0  . iron polysaccharides (NIFEREX) 150 MG capsule Take 150 mg by mouth daily.       No facility-administered medications prior to visit.   Allergies  Allergen Reactions  . Altace [Ramipril]     Dizziness, migraine, weakness  . Codeine Itching  . Naproxen Itching   Patient Active Problem List   Diagnosis Date Noted  . Diabetes 12/31/2013  . Hx of TIA (transient ischemic attack) and stroke 12/31/2013  . Other and unspecified hyperlipidemia 12/31/2013  . Severe obesity (BMI >= 40) 12/31/2013  . Cellulitis of leg, left 09/13/2012  . 272.4 09/13/2012  . Essential hypertension, benign 09/13/2012  . Edema 09/13/2012   History  Substance Use Topics  . Smoking status: Former Smoker --  2.00 packs/day for 20 years    Types: Cigarettes    Quit date: 06/20/1988  . Smokeless tobacco: Never Used  . Alcohol Use: No   family history includes Alzheimer's disease in his mother; Congestive Heart Failure in his father; Diabetes in an other family member; Heart attack in his father; Hypertension in his mother; Stroke in his father. There is no history of Colon cancer, Rectal cancer, or Esophageal cancer.     Objective:   Physical Exam  well-developed older white male in no acute distress, pleasant blood pressure 160/60 pulse 70 height 6 foot weight 277 BMI 40. HEENT; nontraumatic normocephalic EOMI PERRLA sclera anicteric, Supple; no JVD, Cardiovascular ;regular rate and rhythm with S1-S2 he does have a soft systolic murmur pulmonary clear bilaterally, Abdomen; obese soft nontender nondistended bowel sounds are active there is no palpable mass or hepatosplenomegaly, Rectal; exam not done, Extremities ;trace edema bilaterally, Neuro/ psych mood and affect normal and appropriate        Assessment & Plan:  #50 63 year old white male with new finding of microcytic anemia, will need to rule out occult GI blood loss as etiology. Patient currently asymptomatic #2 adult-onset diabetes mellitus #3 hypertension #4 hyperlipidemia #5 obesity BMI 40 #6 remote history of TIA on baby aspirin only  Plan; Will obtain iron studies today Patient is scheduled for colonoscopy and EGD with Dr. Henrene Pastor. Procedures discussed in detail with the patient and he is agreeable to proceed. Further plans pending results of above Patient signed a release today and will attempt to get results of his previous colonoscopy.

## 2013-12-31 NOTE — Progress Notes (Signed)
With assessment and plan as outlined

## 2013-12-31 NOTE — Patient Instructions (Signed)
Please go to the basement level to have your labs drawn.  You have been scheduled for an endoscopy and colonoscopy. Please follow the written instructions given to you at your visit today. We have given you a sample prep for the colonoscopy. If you use inhalers (even only as needed), please bring them with you on the day of your procedure. Your physician has requested that you go to www.startemmi.com and enter the access code given to you at your visit today. This web site gives a general overview about your procedure. However, you should still follow specific instructions given to you by our office regarding your preparation for the procedure.

## 2014-01-07 DIAGNOSIS — L97509 Non-pressure chronic ulcer of other part of unspecified foot with unspecified severity: Secondary | ICD-10-CM | POA: Diagnosis not present

## 2014-01-07 DIAGNOSIS — E1169 Type 2 diabetes mellitus with other specified complication: Secondary | ICD-10-CM | POA: Diagnosis not present

## 2014-01-14 ENCOUNTER — Telehealth: Payer: Self-pay | Admitting: *Deleted

## 2014-01-14 DIAGNOSIS — E1169 Type 2 diabetes mellitus with other specified complication: Secondary | ICD-10-CM | POA: Diagnosis not present

## 2014-01-14 DIAGNOSIS — L97509 Non-pressure chronic ulcer of other part of unspecified foot with unspecified severity: Secondary | ICD-10-CM | POA: Diagnosis not present

## 2014-01-14 NOTE — Telephone Encounter (Signed)
I faxed the release form to [facility]. They have no record of this patient. I called Dr.Jyothi Mann's office and they have never seen this patient. I called Dr. Jacqulynn Cadet [name] office and they have never seen this patient.  We do not know where this patient had a colonoscopy in 2010 or 2011.  He told us it was here in [place].

## 2014-01-20 ENCOUNTER — Encounter: Payer: Self-pay | Admitting: Vascular Surgery

## 2014-01-21 ENCOUNTER — Ambulatory Visit (INDEPENDENT_AMBULATORY_CARE_PROVIDER_SITE_OTHER): Payer: Medicare Other | Admitting: Vascular Surgery

## 2014-01-21 ENCOUNTER — Ambulatory Visit (HOSPITAL_COMMUNITY)
Admission: RE | Admit: 2014-01-21 | Discharge: 2014-01-21 | Disposition: A | Discharge status: Home / Self Care | Payer: Medicare Other | Source: Ambulatory Visit | Attending: Vascular Surgery | Admitting: Vascular Surgery

## 2014-01-21 ENCOUNTER — Encounter: Payer: Self-pay | Admitting: Vascular Surgery

## 2014-01-21 ENCOUNTER — Ambulatory Visit (INDEPENDENT_AMBULATORY_CARE_PROVIDER_SITE_OTHER)
Admission: RE | Admit: 2014-01-21 | Discharge: 2014-01-21 | Disposition: A | Discharge status: Home / Self Care | Payer: Medicare Other | Source: Ambulatory Visit | Attending: Vascular Surgery | Admitting: Vascular Surgery

## 2014-01-21 VITALS — BP 155/57 | HR 62 | Resp 16 | Ht 72.0 in | Wt 275.0 lb

## 2014-01-21 DIAGNOSIS — I739 Peripheral vascular disease, unspecified: Secondary | ICD-10-CM | POA: Insufficient documentation

## 2014-01-21 DIAGNOSIS — L97509 Non-pressure chronic ulcer of other part of unspecified foot with unspecified severity: Secondary | ICD-10-CM | POA: Diagnosis not present

## 2014-01-21 DIAGNOSIS — L98499 Non-pressure chronic ulcer of skin of other sites with unspecified severity: Principal | ICD-10-CM | POA: Insufficient documentation

## 2014-01-21 DIAGNOSIS — Z87891 Personal history of nicotine dependence: Secondary | ICD-10-CM | POA: Insufficient documentation

## 2014-01-21 DIAGNOSIS — E669 Obesity, unspecified: Secondary | ICD-10-CM | POA: Insufficient documentation

## 2014-01-21 DIAGNOSIS — I10 Essential (primary) hypertension: Secondary | ICD-10-CM | POA: Insufficient documentation

## 2014-01-21 DIAGNOSIS — I83893 Varicose veins of bilateral lower extremities with other complications: Secondary | ICD-10-CM

## 2014-01-21 DIAGNOSIS — R609 Edema, unspecified: Secondary | ICD-10-CM | POA: Insufficient documentation

## 2014-01-21 DIAGNOSIS — E119 Type 2 diabetes mellitus without complications: Secondary | ICD-10-CM | POA: Insufficient documentation

## 2014-01-21 DIAGNOSIS — I872 Venous insufficiency (chronic) (peripheral): Secondary | ICD-10-CM | POA: Insufficient documentation

## 2014-01-21 NOTE — Addendum Note (Signed)
Addended by: Mena Goes on: 01/21/2014 04:18 PM   Modules accepted: Orders

## 2014-01-21 NOTE — Progress Notes (Deleted)
Referred by:  Sheela Stack, MD [address] [place], [place] [address]  Reason for referral: diabetic ulcer   History of Present Illness  Jordan [name] is a 63 y.o. (09/09/1950) male who presents with chief complaint: diabetic foot ulcer.  Onset of symptom occurred ***.  Pain is described as ***, severity ***/10, and associated with ***.  Patient has attempted to treat this pain with ***.  The patient has *** rest pain symptoms also and *** leg wounds/ulcers, *** described as ***.  Atherosclerotic risk factors include: ***.  Past Medical History  Diagnosis Date  . Anxiety   . Arthritis   . COPD (chronic obstructive pulmonary disease)   . Depression   . Diabetes mellitus without complication   . GERD (gastroesophageal reflux disease)   . Hypertension   . Migraine   . Hyperlipidemia   . Obesity   . Sleep apnea     CPAP     Past Surgical History  Procedure Laterality Date  . Knee arthroscopy      Dr. Noemi Chapel  . Ankle surgery Right     Dr. Marcelino Scot, has pins  . Tonsillectomy and adenoidectomy      age 25    History   Social History  . Marital Status: Single    Spouse Name: N/A    Number of Children: 0  . Years of Education: N/A   Occupational History  . retired Corporate investment banker    Social History Main Topics  . Smoking status: Former Smoker -- 2.00 packs/day for 20 years    Types: Cigarettes    Quit date: 06/20/1988  . Smokeless tobacco: Never Used  . Alcohol Use: No  . Drug Use: No  . Sexual Activity: Yes    Birth Control/ Protection: Condom   Other Topics Concern  . Not on file   Social History Narrative  . No narrative on file    Family History  Problem Relation Age of Onset  . Hypertension Mother   . Stroke Father     x 3  . Diabetes      mat great aunts  . Colon cancer Neg Hx   . Rectal cancer Neg Hx   . Alzheimer's disease Mother   . Heart attack Father   . Congestive Heart Failure Father   . Esophageal cancer Neg Hx      Current Outpatient Prescriptions on File Prior to Visit  Medication Sig Dispense Refill  . aliskiren (TEKTURNA) 300 MG tablet Take 300 mg by mouth daily.      Marland Kitchen aspirin 81 MG tablet Take 81 mg by mouth daily.      Marland Kitchen atenolol (TENORMIN) 50 MG tablet Take 50 mg by mouth daily.      Marland Kitchen atorvastatin (LIPITOR) 80 MG tablet Take 80 mg by mouth daily.      . butalbital-acetaminophen-caffeine (FIORICET, ESGIC) 50-325-40 MG per tablet Take 1 tablet by mouth 2 (two) times daily as needed for pain or headache.      . furosemide (LASIX) 40 MG tablet Take 40 mg by mouth as needed.       Marland Kitchen LORazepam (ATIVAN) 1 MG tablet Take 1 mg by mouth every 8 (eight) hours as needed for anxiety.      . metFORMIN (GLUCOPHAGE) 500 MG tablet Take 500 mg by mouth daily.      . methocarbamol (ROBAXIN) 500 MG tablet Take 500 mg by mouth 4 (four) times daily as needed (for muscle spasm.).      Marland Kitchen  MOVIPREP 100 G SOLR Take 1 kit (200 g total) by mouth as directed.  1 kit  0  . Multiple Vitamin (MULTIVITAMIN WITH MINERALS) TABS Take 1 tablet by mouth daily.      Marland Kitchen omeprazole (PRILOSEC) 20 MG capsule Take 20 mg by mouth daily.      Marland Kitchen perphenazine (TRILAFON) 2 MG tablet Take 2 mg by mouth daily.      . pioglitazone (ACTOS) 45 MG tablet Take by mouth daily. Take 1/2 tablet daily      . promethazine (PHENERGAN) 25 MG tablet Take 25 mg by mouth every 4 (four) hours as needed for nausea.      Marland Kitchen venlafaxine XR (EFFEXOR-XR) 75 MG 24 hr capsule Take 75-150 mg by mouth 2 (two) times daily. 1 cap in am, 2 caps in pm.       No current facility-administered medications on file prior to visit.    Allergies  Allergen Reactions  . Altace [Ramipril]     Dizziness, migraine, weakness  . Codeine Itching  . Naproxen Itching    REVIEW OF SYSTEMS:  (Positives checked otherwise negative)  CARDIOVASCULAR:  _0  chest pain, _1  chest pressure, _2  palpitations, _3  shortness of breath when laying flat, _4  shortness of breath with exertion,    _5  pain in feet when walking, _6  pain in feet when laying flat, _7  history of blood clot in veins (DVT), _8  history of phlebitis, _9  swelling in legs, _10  varicose veins  PULMONARY:  _11  productive cough, _12  asthma, _13  wheezing  NEUROLOGIC:  _14  weakness in arms or legs, _15  numbness in arms or legs, _16  difficulty speaking or slurred speech, _17  temporary loss of vision in one eye, _18  dizziness  HEMATOLOGIC:  _19  bleeding problems, _20  problems with blood clotting too easily  MUSCULOSKEL:  _21  joint pain, _22  joint swelling  GASTROINTEST:  _23   Vomiting blood, _24   Blood in stool     GENITOURINARY:  _25   Burning with urination, _26   Blood in urine  PSYCHIATRIC:  _27  history of major depression  INTEGUMENTARY:  _28  rashes, _29  ulcers  CONSTITUTIONAL:  _30  fever, _31  chills  For VQI Use Only  ***  PRE-ADM LIVING: {VQI Pre-admission Living:20973}  AMB STATUS: {VQI Ambulatory Status:20974}  CAD Sx: {VQI CAD symptoms:20970}  PRIOR CHF: {VQI Prior POE:42353}  STRESS TEST: _32  No, _33  Normal, _34  + ischemia, _35  + MI, _36  Both  ***  Physical Examination There were no vitals filed for this visit. There is no weight on file to calculate BMI.  General: A&O x 3, WDWN***, Cachectic / Ill appear / Somulent  Head: [place]/AT***, Temporalis wasting / Prominent temp pulse  Ear/Nose/Throat: Hearing grossly intact, nares w/o erythema or drainage, oropharynx w/o Erythema/Exudate***, Hearing loss, Nasal drainage, Dental caries, OP w/ erythema / exudate  Eyes: PERRLA, EOMI***, Post surg chg to pupils / Unable to coop w/ exam  Neck: Supple, no nuchal rigidity, no palpable LAD***, LAD / Nuchal rigidity  Pulmonary: Sym exp, good air movt, CTAB, no rales, rhonchi, & wheezing***, + rales / + rhonchi / + wheezing  Cardiac: RRR, Nl S1, S2, no Murmurs, rubs or gallops***, S3/S4, Irregularly, irregular rhythm and rate  Vascular: Vessel Right Left  Radial ***Palpable ***Palpable   Brachial *** Palpable ***Palpable  Carotid ***Palpable, with***out  bruit ***Palpable, with***out bruit  Aorta ***Not palpable N/A  Femoral ***Palpable ***Palpable  Popliteal ***Not palpable ***Not palpable  PT ***Palpable ***Palpable  DP ***Palpable ***Palpable   Gastrointestinal: soft, NTND, -G/R, - HSM, - masses, - CVAT B***, + AAA / Surg. Inc TTP: (RUQ / RLQ / LUQ / LLQ / Epigastric), +G / +R  Musculoskeletal: M/S 5/5 throughout *** except ***, Extremities without ischemic changes *** except  ***  Neurologic: CN 2-12 intact *** except ***, Pain and light touch intact in extremities *** except ***, Motor exam as listed above  Psychiatric: Judgment intact, Mood & affect appropriatefor pt's clinical situation***, + MDD / + Bipolar / + Poor judgement  Dermatologic: See M/S exam for extremity exam, no rashes otherwise noted  Lymph : No Cervical, Axillary, or Inguinal lymphadenopathy *** except ***  Non-Invasive Vascular Imaging  ABI (Date: 01/21/2014)  R: *** (***), DP: ***, PT: ***, TBI: ***  L: *** (***), DP: ***, PT: ***, TBI: ***  Outside Studies/Documentation *** pages of outside documents were reviewed including: ***.  Medical Decision Making  ROCKLIN SODERQUIST is a 63 y.o. male who presents with: ***   I discussed with the patient the natural history of intermittent claudication: 75% of patients have stable or improved symptoms in a year an only 2% require amputation. Eventually 20% may require intervention in a year.  I discussed in depth with the patient the nature of atherosclerosis, and emphasized the importance of maximal medical management including strict control of blood pressure, blood glucose, and lipid levels, antiplatelet agent, obtaining regular exercise, and cessation of smoking.    The patient is aware that without maximal medical management the underlying atherosclerotic disease process will progress, limiting the benefit of any interventions.  I  discussed in depth with the patient a walking plan and how to execute such.  The patient is *** interested in starting Pletal. The patient is currently *** on a statin: ***.  ***The patient will be started on Lipitor 10 mg PO daily, to be titrated and managed by their primary care physician.   The patient is currently *** on an anti-platelet: ***.  ***The patient will be started on ASA 81 mg PO daily.  The patient will follow up in *** weeks with the following studies ***.    Thank you for allowing [place] to participate in this patient's care.  Virgina Jock, PA-C [facility] Office: [phone] Pager: [phone]  01/21/2014, 3:28 PM   This patient was seen in conjunction with Dr. Kellie Simmering.

## 2014-01-21 NOTE — Progress Notes (Signed)
Subjective:     Patient ID: [name], male   DOB: [birth year], 63 y.o.   MRN: [id number]  HPI this 63 year old male was referred by Dr. Elesa Hacker in the wound center for evaluation of possible ischemic ulcer right first toe. The patient states the ulcer has been present for 7 months. He has been having severe edema in both legs for at least the last 3 years. He has no history of DVT or thrombophlebitis. He does wear her compression wraps in both lower extremities and has done so for the past 2 years. The ulceration on the first toe has not healed. He does have diabetes mellitus-type II.  Past Medical History  Diagnosis Date  . Anxiety   . Arthritis   . COPD (chronic obstructive pulmonary disease)   . Depression   . Diabetes mellitus without complication   . GERD (gastroesophageal reflux disease)   . Hypertension   . Migraine   . Hyperlipidemia   . Obesity   . Sleep apnea     CPAP     History  Substance Use Topics  . Smoking status: Former Smoker -- 2.00 packs/day for 20 years    Types: Cigarettes    Quit date: 06/20/1988  . Smokeless tobacco: Never Used  . Alcohol Use: No    Family History  Problem Relation Age of Onset  . Hypertension Mother   . Stroke Father     x 3  . Diabetes      mat great aunts  . Colon cancer Neg Hx   . Rectal cancer Neg Hx   . Alzheimer's disease Mother   . Heart attack Father   . Congestive Heart Failure Father   . Esophageal cancer Neg Hx     Allergies  Allergen Reactions  . Altace [Ramipril]     Dizziness, migraine, weakness  . Codeine Itching  . Naproxen Itching    Current outpatient prescriptions:aliskiren (TEKTURNA) 300 MG tablet, Take 300 mg by mouth daily., Disp: , Rfl: ;  aspirin 81 MG tablet, Take 81 mg by mouth daily., Disp: , Rfl: ;  atenolol (TENORMIN) 50 MG tablet, Take 50 mg by mouth daily., Disp: , Rfl: ;  atorvastatin (LIPITOR) 80 MG tablet, Take 80 mg by mouth daily., Disp: , Rfl:   butalbital-acetaminophen-caffeine (FIORICET, ESGIC) 50-325-40 MG per tablet, Take 1 tablet by mouth 2 (two) times daily as needed for pain or headache., Disp: , Rfl: ;  furosemide (LASIX) 40 MG tablet, Take 40 mg by mouth as needed. , Disp: , Rfl: ;  LORazepam (ATIVAN) 1 MG tablet, Take 1 mg by mouth every 8 (eight) hours as needed for anxiety., Disp: , Rfl: ;  metFORMIN (GLUCOPHAGE) 500 MG tablet, Take 500 mg by mouth daily., Disp: , Rfl:  methocarbamol (ROBAXIN) 500 MG tablet, Take 500 mg by mouth 4 (four) times daily as needed (for muscle spasm.)., Disp: , Rfl: ;  MOVIPREP 100 G SOLR, Take 1 kit (200 g total) by mouth as directed., Disp: 1 kit, Rfl: 0;  Multiple Vitamin (MULTIVITAMIN WITH MINERALS) TABS, Take 1 tablet by mouth daily., Disp: , Rfl: ;  omeprazole (PRILOSEC) 20 MG capsule, Take 20 mg by mouth daily., Disp: , Rfl:  perphenazine (TRILAFON) 2 MG tablet, Take 2 mg by mouth daily., Disp: , Rfl: ;  pioglitazone (ACTOS) 45 MG tablet, Take by mouth daily. Take 1/2 tablet daily, Disp: , Rfl: ;  promethazine (PHENERGAN) 25 MG tablet, Take 25 mg by mouth every 4 (four)  hours as needed for nausea., Disp: , Rfl: ;  venlafaxine XR (EFFEXOR-XR) 75 MG 24 hr capsule, Take 75-150 mg by mouth 2 (two) times daily. 1 cap in am, 2 caps in pm., Disp: , Rfl:   BP 155/57  Pulse 62  Resp 16  Ht 6' (1.829 m)  Wt 275 lb (124.739 kg)  BMI 37.29 kg/m2  Body mass index is 37.29 kg/(m^2).          Review of Systems patient has chronic edema of both lower extremities with severe skin changes. Has history of depression. Other systems negative on complete review of systems    Objective:   Physical Exam BP 155/57  Pulse 62  Resp 16  Ht 6' (1.829 m)  Wt 275 lb (124.739 kg)  BMI 37.29 kg/m2  Gen.-alert and oriented x3 in no apparent distress-obese HEENT normal for age Lungs no rhonchi or wheezing Cardiovascular regular rhythm no murmurs carotid pulses 3+ palpable no bruits audible Abdomen soft  nontender no palpable masses-obese Musculoskeletal free of  major deformities Skin clear -no rashes Neurologic normal Lower extremities 3+ femoral pulses palpable bilaterally. Both feet are pink and well perfused. There is brisk biphasic flow in dorsalis pedis artery bilaterally. There is an ulceration on the plantar aspect of the right first toe which has a dressing intact. There is no evidence of any infection surrounding this. Both lower extremities have severe diffuse edema from the knee distally severe hyperpigmentation and crusty and edematous skin with lipo dermato sclerosis.  Today I ordered a lower extremity arterial Doppler exam which revealed ABI exceeding 1.0 bilaterally with no evidence of arterial insufficiency. I performed a bedside venous duplex exam which reveals gross reflux with pulsatile flow in both right saphenous veins.        Assessment:     Suspect severe gross reflux bilateral great saphenous veins with no evidence of arterial insufficiency with ulceration toe right foot2    Plan:     Will schedule patient for formal venous duplex reflux exam over next several days and determine if bilateral laser ablation procedures would be indicated. He has severe skin changes with chronic edema and nonhealing ulcer right first toe

## 2014-01-28 ENCOUNTER — Encounter (HOSPITAL_BASED_OUTPATIENT_CLINIC_OR_DEPARTMENT_OTHER): Payer: Medicare Other | Attending: General Surgery

## 2014-01-28 DIAGNOSIS — E1169 Type 2 diabetes mellitus with other specified complication: Secondary | ICD-10-CM | POA: Insufficient documentation

## 2014-01-28 DIAGNOSIS — L97509 Non-pressure chronic ulcer of other part of unspecified foot with unspecified severity: Secondary | ICD-10-CM | POA: Insufficient documentation

## 2014-02-04 DIAGNOSIS — E1169 Type 2 diabetes mellitus with other specified complication: Secondary | ICD-10-CM | POA: Diagnosis not present

## 2014-02-04 DIAGNOSIS — L97509 Non-pressure chronic ulcer of other part of unspecified foot with unspecified severity: Secondary | ICD-10-CM | POA: Diagnosis not present

## 2014-02-13 ENCOUNTER — Encounter: Payer: Self-pay | Admitting: Internal Medicine

## 2014-02-13 ENCOUNTER — Ambulatory Visit (AMBULATORY_SURGERY_CENTER): Payer: Medicare Other | Admitting: Internal Medicine

## 2014-02-13 VITALS — BP 148/67 | HR 62 | Temp 97.5°F | Resp 17 | Ht 72.0 in | Wt 277.0 lb

## 2014-02-13 DIAGNOSIS — D1330 Benign neoplasm of unspecified part of small intestine: Secondary | ICD-10-CM

## 2014-02-13 DIAGNOSIS — D120 Benign neoplasm of cecum: Secondary | ICD-10-CM

## 2014-02-13 DIAGNOSIS — D123 Benign neoplasm of transverse colon: Secondary | ICD-10-CM

## 2014-02-13 DIAGNOSIS — D509 Iron deficiency anemia, unspecified: Secondary | ICD-10-CM

## 2014-02-13 DIAGNOSIS — D129 Benign neoplasm of anus and anal canal: Secondary | ICD-10-CM

## 2014-02-13 DIAGNOSIS — D128 Benign neoplasm of rectum: Secondary | ICD-10-CM

## 2014-02-13 DIAGNOSIS — D126 Benign neoplasm of colon, unspecified: Secondary | ICD-10-CM

## 2014-02-13 DIAGNOSIS — D122 Benign neoplasm of ascending colon: Secondary | ICD-10-CM

## 2014-02-13 LAB — GLUCOSE, CAPILLARY
Glucose-Capillary: 78 mg/dL (ref 70–99)
Glucose-Capillary: 84 mg/dL (ref 70–99)
Glucose-Capillary: 126 mg/dL — ABNORMAL HIGH (ref 70–99)

## 2014-02-13 MED ORDER — SODIUM CHLORIDE 0.9 % IV SOLN: 500.0000 mL | INTRAVENOUS | Status: DC

## 2014-02-13 NOTE — Op Note (Addendum)
[facility]  [address] [place], [address]   ENDOSCOPY PROCEDURE REPORT  PATIENT: Jordan [name], Jordan [name]  MR#: [id number] BIRTHDATE: 10-28-1950 , 62  yrs. old GENDER: Male ENDOSCOPIST: Eustace Quail, MD REFERRED BY:  [name], M.D. PROCEDURE DATE:  02/13/2014 PROCEDURE:  EGD w/ biopsy ASA CLASS:     Class III INDICATIONS:  Iron deficiency anemia. MEDICATIONS: MAC sedation, administered by CRNA and propofol (Diprivan) 160mg  IV TOPICAL ANESTHETIC: none  DESCRIPTION OF PROCEDURE: After the risks benefits and alternatives of the procedure were thoroughly explained, informed consent was obtained.  The LB [id number] endoscope was introduced through the mouth and advanced to the third portion of the duodenum. Without limitations.  The instrument was slowly withdrawn as the mucosa was fully examined.    EXAM:The upper, middle and distal third of the esophagus were carefully inspected and no abnormalities were noted.  The z-line was well seen at the GEJ.  The endoscope was pushed into the fundus which was normal including a retroflexed view.  The antrum, gastric body, first and second part of the duodenum were unremarkable. Duodenal biopsies taken to rule out sprue.  Retroflexed views revealed no abnormalities.     The scope was then withdrawn from the patient and the procedure completed.  COMPLICATIONS: There were no complications. ENDOSCOPIC IMPRESSION: 1. Normal EGD  RECOMMENDATIONS: 1.  Await biopsy results 2.  Continue iron supplement daily 3. Have your blood counts checked by Dr. Forde Dandy in a few weeks 4. Could consider capsule endoscopy if biopsies negative  REPEAT EXAM:  eSigned:  Eustace Quail, MD 02/13/2014 3:14 PM   [facility] Forde Dandy, MD and The Patient

## 2014-02-13 NOTE — Progress Notes (Signed)
Called to room to assist during endoscopic procedure.  Patient ID and intended procedure confirmed with present staff. Received instructions for my participation in the procedure from the performing physician.  

## 2014-02-13 NOTE — Patient Instructions (Addendum)
YOU HAD AN ENDOSCOPIC PROCEDURE TODAY AT THE [facility]: Refer to the procedure report that was given to you for any specific questions about what was found during the examination.  If the procedure report does not answer your questions, please call your gastroenterologist to clarify.  If you requested that your care partner not be given the details of your procedure findings, then the procedure report has been included in a sealed envelope for you to review at your convenience later.  YOU SHOULD EXPECT: Some feelings of bloating in the abdomen. Passage of more gas than usual.  Walking can help get rid of the air that was put into your GI tract during the procedure and reduce the bloating. If you had a lower endoscopy (such as a colonoscopy or flexible sigmoidoscopy) you may notice spotting of blood in your stool or on the toilet paper. If you underwent a bowel prep for your procedure, then you may not have a normal bowel movement for a few days.  DIET: Your first meal following the procedure should be a light meal and then it is ok to progress to your normal diet.  A half-sandwich or bowl of soup is an example of a good first meal.  Heavy or fried foods are harder to digest and may make you feel nauseous or bloated.  Likewise meals heavy in dairy and vegetables can cause extra gas to form and this can also increase the bloating.  Drink plenty of fluids but you should avoid alcoholic beverages for 24 hours.  ACTIVITY: Your care partner should take you home directly after the procedure.  You should plan to take it easy, moving slowly for the rest of the day.  You can resume normal activity the day after the procedure however you should NOT DRIVE or use heavy machinery for 24 hours (because of the sedation medicines used during the test).    SYMPTOMS TO REPORT IMMEDIATELY: A gastroenterologist can be reached at any hour.  During normal business hours, 8:30 AM to 5:00 PM Monday through Friday,  call [phone].  After hours and on weekends, please call the GI answering service at [phone] who will take a message and have the physician on call contact you.   Following lower endoscopy (colonoscopy or flexible sigmoidoscopy):  Excessive amounts of blood in the stool  Significant tenderness or worsening of abdominal pains  Swelling of the abdomen that is new, acute  Fever of 100F or higher  Following upper endoscopy (EGD)  Vomiting of blood or coffee ground material  New chest pain or pain under the shoulder blades  Painful or persistently difficult swallowing  New shortness of breath  Fever of 100F or higher  Black, tarry-looking stools  FOLLOW UP: If any biopsies were taken you will be contacted by phone or by letter within the next 1-3 weeks.  Call your gastroenterologist if you have not heard about the biopsies in 3 weeks.  Our staff will call the home number listed on your records the next business day following your procedure to check on you and address any questions or concerns that you may have at that time regarding the information given to you following your procedure. This is a courtesy call and so if there is no answer at the home number and we have not heard from you through the emergency physician on call, we will assume that you have returned to your regular daily activities without incident.  SIGNATURES/CONFIDENTIALITY: You and/or your care   partner have signed paperwork which will be entered into your electronic medical record.  These signatures attest to the fact that that the information above on your After Visit Summary has been reviewed and is understood.  Full responsibility of the confidentiality of this discharge information lies with you and/or your care-partner.  Polyp, and high fiber diet information given.  Continue iron supplement daily.  Have blood counts checked by Dr. Forde Dandy in a few weeks.  Repeat colonoscopy in 3 years-2018.

## 2014-02-13 NOTE — Op Note (Addendum)
[place]  [address] [place], [address]   COLONOSCOPY PROCEDURE REPORT  PATIENT: Jordan [name], Jordan [name]  MR#: [id number] BIRTHDATE: Jun 27, 1950 , 62  yrs. old GENDER: Male ENDOSCOPIST: Eustace Quail, MD REFERRED DY:[id number] Forde Dandy, M.D. PROCEDURE DATE:  02/13/2014 PROCEDURE:   Colonoscopy with snare polypectomy x 6. Extended service time and technical First Screening Colonoscopy - Avg.  risk and is 50 yrs.  old or older - No.  Prior Negative Screening - Now for repeat screening. N/A  History of Adenoma - Now for follow-up colonoscopy & has been > or = to 3 yrs.  N/A  Polyps Removed Today? Yes. ASA CLASS:   Class III INDICATIONS:Iron Deficiency Anemia. MEDICATIONS: MAC sedation, administered by CRNA and propofol (Diprivan) 400mg  IV DESCRIPTION OF PROCEDURE:   After the risks benefits and alternatives of the procedure were thoroughly explained, informed consent was obtained.  A digital rectal exam revealed no abnormalities of the rectum.   The LB [id number]  endoscope was introduced through the anus and advanced to the cecum, which was identified by both the appendix and ileocecal valve. No adverse events experienced.   The quality of the prep was Moviprep fair (upgraded to good with tremendous effort-vigorous irrigation and suction and time).The instrument was then slowly withdrawn as the colon was fully examined.  COLON FINDINGS: Five polyps ranging between 5-64mm in size were found in the cecum, ascending and transverse colon.  A polypectomy was performed with a cold snare.   A pedunculated polyp measuring 15 mm in size was found in the rectum.  A polypectomy was performed using snare cautery.  All resections were complete and the polyp tissue was completely retrieved.   The colon mucosa was otherwise normal. Retroflexed views revealed no abnormalities. The time to cecum=5 minutes 46 seconds.  Withdrawal time=31 minutes 37 seconds.  The scope was  withdrawn and the procedure completed. COMPLICATIONS: There were no complications.  ENDOSCOPIC IMPRESSION: 1.   Five polyps were found in the colon; polypectomy was performed with a cold snare 2.   Pedunculated polyp measuring 15 mm in size was found in the rectum; polypectomy was performed using snare cautery 3.   The colon mucosa was otherwise normal  RECOMMENDATIONS: 1.  Repeat Colonoscopy in 3 years. 2.  Upper endoscopy today (see report)   eSigned:  Eustace Quail, MD 02/13/2014 3:11 PM   cc: [name], MD and The Patient

## 2014-02-13 NOTE — Progress Notes (Signed)
Report to PACU, RN, vss, BBS= Clear.  

## 2014-02-13 NOTE — Progress Notes (Signed)
1358- blood sunotifiedgar rechecked =84 D5w kvo ,crna j Monday

## 2014-02-14 ENCOUNTER — Telehealth: Payer: Self-pay | Admitting: *Deleted

## 2014-02-14 NOTE — Telephone Encounter (Signed)
  Follow up Call-  Call back number 02/13/2014  Post procedure Call Back phone  # [phone]  Permission to leave phone message Yes     Patient questions:  Do you have a fever, pain , or abdominal swelling? No. Pain Score  0 *  Have you tolerated food without any problems? Yes.    Have you been able to return to your normal activities? Yes.    Do you have any questions about your discharge instructions: Diet   No. Medications  No. Follow up visit  No.  Do you have questions or concerns about your Care? No.  Actions: * If pain score is 4 or above: No action needed, pain <4.

## 2014-02-18 ENCOUNTER — Encounter (HOSPITAL_BASED_OUTPATIENT_CLINIC_OR_DEPARTMENT_OTHER): Payer: Medicare Other | Attending: General Surgery

## 2014-02-18 DIAGNOSIS — E1169 Type 2 diabetes mellitus with other specified complication: Secondary | ICD-10-CM | POA: Insufficient documentation

## 2014-02-18 DIAGNOSIS — L97509 Non-pressure chronic ulcer of other part of unspecified foot with unspecified severity: Secondary | ICD-10-CM | POA: Diagnosis not present

## 2014-02-25 ENCOUNTER — Encounter (HOSPITAL_COMMUNITY): Payer: Medicare Other

## 2014-02-25 DIAGNOSIS — E1169 Type 2 diabetes mellitus with other specified complication: Secondary | ICD-10-CM | POA: Diagnosis not present

## 2014-02-28 ENCOUNTER — Encounter: Payer: Self-pay | Admitting: Internal Medicine

## 2014-03-11 ENCOUNTER — Ambulatory Visit (INDEPENDENT_AMBULATORY_CARE_PROVIDER_SITE_OTHER): Payer: Medicare Other | Admitting: Vascular Surgery

## 2014-03-11 ENCOUNTER — Encounter: Payer: Self-pay | Admitting: Vascular Surgery

## 2014-03-11 ENCOUNTER — Ambulatory Visit (HOSPITAL_COMMUNITY)
Admission: RE | Admit: 2014-03-11 | Discharge: 2014-03-11 | Disposition: A | Discharge status: Home / Self Care | Payer: Medicare Other | Source: Ambulatory Visit | Attending: Vascular Surgery | Admitting: Vascular Surgery

## 2014-03-11 DIAGNOSIS — I872 Venous insufficiency (chronic) (peripheral): Secondary | ICD-10-CM | POA: Insufficient documentation

## 2014-03-11 DIAGNOSIS — Z87891 Personal history of nicotine dependence: Secondary | ICD-10-CM | POA: Insufficient documentation

## 2014-03-11 DIAGNOSIS — R609 Edema, unspecified: Secondary | ICD-10-CM | POA: Insufficient documentation

## 2014-03-11 DIAGNOSIS — L97509 Non-pressure chronic ulcer of other part of unspecified foot with unspecified severity: Secondary | ICD-10-CM | POA: Diagnosis not present

## 2014-03-11 DIAGNOSIS — I83893 Varicose veins of bilateral lower extremities with other complications: Secondary | ICD-10-CM | POA: Diagnosis present

## 2014-03-11 DIAGNOSIS — J449 Chronic obstructive pulmonary disease, unspecified: Secondary | ICD-10-CM | POA: Diagnosis not present

## 2014-03-11 DIAGNOSIS — M79609 Pain in unspecified limb: Secondary | ICD-10-CM | POA: Insufficient documentation

## 2014-03-11 DIAGNOSIS — E669 Obesity, unspecified: Secondary | ICD-10-CM | POA: Diagnosis not present

## 2014-03-11 DIAGNOSIS — J4489 Other specified chronic obstructive pulmonary disease: Secondary | ICD-10-CM | POA: Insufficient documentation

## 2014-03-11 NOTE — Progress Notes (Signed)
Subjective:     Patient ID: Jordan [name], male   DOB: February 12, 1951, 63 y.o.   MRN: [id number]  HPI this 63 year old male returns today to have a venous duplex exam performed of both lower extremities for further evaluation of his nonhealing ulcer of the right foot for the past 7 months. He has been shown to have no evidence of arterial insufficiency. He has been in compression dressings through the wound Center with debridement for the past 7 months with no resolution of the ulceration. He has had chronic swelling in both legs but no history of DVT.  Past Medical History  Diagnosis Date  . Anxiety   . Arthritis   . COPD (chronic obstructive pulmonary disease)   . Depression   . Diabetes mellitus without complication   . GERD (gastroesophageal reflux disease)   . Hypertension   . Migraine   . Hyperlipidemia   . Obesity   . Sleep apnea     CPAP     History  Substance Use Topics  . Smoking status: Former Smoker -- 2.00 packs/day for 20 years    Types: Cigarettes    Quit date: 06/20/1988  . Smokeless tobacco: Never Used  . Alcohol Use: No    Family History  Problem Relation Age of Onset  . Hypertension Mother   . Stroke Father     x 3  . Diabetes      mat great aunts  . Colon cancer Neg Hx   . Rectal cancer Neg Hx   . Alzheimer's disease Mother   . Heart attack Father   . Congestive Heart Failure Father   . Esophageal cancer Neg Hx     Allergies  Allergen Reactions  . Altace [Ramipril]     Dizziness, migraine, weakness  . Codeine Itching  . Naproxen Itching    Current outpatient prescriptions:aliskiren (TEKTURNA) 300 MG tablet, Take 300 mg by mouth daily., Disp: , Rfl: ;  aspirin 81 MG tablet, Take 81 mg by mouth daily., Disp: , Rfl: ;  atenolol (TENORMIN) 50 MG tablet, Take 50 mg by mouth daily., Disp: , Rfl: ;  atorvastatin (LIPITOR) 80 MG tablet, Take 80 mg by mouth daily., Disp: , Rfl:  butalbital-acetaminophen-caffeine (FIORICET, ESGIC) 50-325-40 MG per  tablet, Take 1 tablet by mouth 2 (two) times daily as needed for pain or headache., Disp: , Rfl: ;  furosemide (LASIX) 40 MG tablet, Take 40 mg by mouth as needed. , Disp: , Rfl: ;  LORazepam (ATIVAN) 1 MG tablet, Take 1 mg by mouth every 8 (eight) hours as needed for anxiety., Disp: , Rfl: ;  metFORMIN (GLUCOPHAGE) 500 MG tablet, Take 500 mg by mouth daily., Disp: , Rfl:  methocarbamol (ROBAXIN) 500 MG tablet, Take 500 mg by mouth 4 (four) times daily as needed (for muscle spasm.)., Disp: , Rfl: ;  Multiple Vitamin (MULTIVITAMIN WITH MINERALS) TABS, Take 1 tablet by mouth daily., Disp: , Rfl: ;  omeprazole (PRILOSEC) 20 MG capsule, Take 20 mg by mouth daily., Disp: , Rfl: ;  perphenazine (TRILAFON) 2 MG tablet, Take 2 mg by mouth daily., Disp: , Rfl:  pioglitazone (ACTOS) 45 MG tablet, Take by mouth daily. Take 1/2 tablet daily, Disp: , Rfl: ;  promethazine (PHENERGAN) 25 MG tablet, Take 25 mg by mouth every 4 (four) hours as needed for nausea., Disp: , Rfl: ;  venlafaxine XR (EFFEXOR-XR) 75 MG 24 hr capsule, Take 75-150 mg by mouth 2 (two) times daily. 1 cap in am, 2  caps in pm., Disp: , Rfl:   There were no vitals taken for this visit.  There is no weight on file to calculate BMI.          Review of Systems denies chest pain, amoxicillin and dyspnea on exertion.    Objective:   Physical Exam There were no vitals taken for this visit.  Gen. obese male patient in no apparent stress alert and oriented x3 Both lower extremities with chronic edema urine compression dressings in place. Nonhealing ulcer right first toe.  Today I ordered bilateral venous duplex exam which I reviewed and interpreted. He has gross reflux in the right small saphenous vein in the left great saphenous line both contributing to his venous hypertension distally. His only deep reflexes in the common femoral vein.      Assessment:     Bilateral gross reflux-right small saphenous and left great saphenous veins with  nonhealing ulcer right foot and chronic edema and pain. Ulcer has not responded to compression dressings at the wound center for past 7 months    Plan:     Patient needs #1 laser ablation right small saphenous vein followed by #2 laser oblation left great saphenous vein. We'll proceed with presurgical to perform this in the near future and hopefully this will help with his symptoms and heal ulcer right foot

## 2014-03-18 DIAGNOSIS — E1169 Type 2 diabetes mellitus with other specified complication: Secondary | ICD-10-CM | POA: Diagnosis not present

## 2014-03-18 DIAGNOSIS — L97509 Non-pressure chronic ulcer of other part of unspecified foot with unspecified severity: Secondary | ICD-10-CM | POA: Diagnosis not present

## 2014-03-25 ENCOUNTER — Other Ambulatory Visit: Payer: Self-pay | Admitting: *Deleted

## 2014-03-25 ENCOUNTER — Encounter (HOSPITAL_BASED_OUTPATIENT_CLINIC_OR_DEPARTMENT_OTHER): Payer: Medicare Other | Attending: General Surgery

## 2014-03-25 DIAGNOSIS — L97519 Non-pressure chronic ulcer of other part of right foot with unspecified severity: Secondary | ICD-10-CM | POA: Diagnosis not present

## 2014-03-25 DIAGNOSIS — I83219 Varicose veins of right lower extremity with both ulcer of unspecified site and inflammation: Secondary | ICD-10-CM

## 2014-03-25 DIAGNOSIS — E11621 Type 2 diabetes mellitus with foot ulcer: Secondary | ICD-10-CM | POA: Diagnosis present

## 2014-04-08 DIAGNOSIS — L97519 Non-pressure chronic ulcer of other part of right foot with unspecified severity: Secondary | ICD-10-CM | POA: Diagnosis not present

## 2014-04-08 DIAGNOSIS — E11621 Type 2 diabetes mellitus with foot ulcer: Secondary | ICD-10-CM | POA: Diagnosis not present

## 2014-04-14 ENCOUNTER — Other Ambulatory Visit: Payer: Medicare Other | Admitting: Vascular Surgery

## 2014-04-15 DIAGNOSIS — E11621 Type 2 diabetes mellitus with foot ulcer: Secondary | ICD-10-CM | POA: Diagnosis not present

## 2014-04-21 ENCOUNTER — Ambulatory Visit: Payer: Medicare Other | Admitting: Vascular Surgery

## 2014-04-21 ENCOUNTER — Encounter (HOSPITAL_COMMUNITY): Payer: Medicare Other

## 2014-04-22 ENCOUNTER — Encounter (HOSPITAL_BASED_OUTPATIENT_CLINIC_OR_DEPARTMENT_OTHER): Payer: Medicare Other | Attending: General Surgery

## 2014-04-22 DIAGNOSIS — L97519 Non-pressure chronic ulcer of other part of right foot with unspecified severity: Secondary | ICD-10-CM | POA: Insufficient documentation

## 2014-04-22 DIAGNOSIS — E11621 Type 2 diabetes mellitus with foot ulcer: Secondary | ICD-10-CM | POA: Insufficient documentation

## 2014-04-25 ENCOUNTER — Encounter: Payer: Self-pay | Admitting: Vascular Surgery

## 2014-04-28 ENCOUNTER — Ambulatory Visit (INDEPENDENT_AMBULATORY_CARE_PROVIDER_SITE_OTHER): Payer: Medicare Other | Admitting: Vascular Surgery

## 2014-04-28 ENCOUNTER — Encounter: Payer: Self-pay | Admitting: Vascular Surgery

## 2014-04-28 VITALS — BP 146/73 | HR 71 | Resp 16 | Ht 72.0 in | Wt 265.0 lb

## 2014-04-28 DIAGNOSIS — I83891 Varicose veins of right lower extremities with other complications: Secondary | ICD-10-CM

## 2014-04-28 DIAGNOSIS — I872 Venous insufficiency (chronic) (peripheral): Secondary | ICD-10-CM | POA: Insufficient documentation
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
## 2014-04-28 NOTE — Progress Notes (Signed)
Subjective:     Patient ID: Jordan [name], male   DOB: 1950/10/15, 63 y.o.   MRN: [id number]  HPI this 63 year old male had laser ablation of the right small saphenous vein from the mid calf to near the saphenofemoral popliteal junction performed under local tumescent anesthesia. A total of 880 J of energy was utilized. He tolerated the procedure well.  Review of Systems     Objective:   Physical Exam BP 146/73 mmHg  Pulse 71  Resp 16  Ht 6' (1.829 m)  Wt 265 lb (120.203 kg)  BMI 35.93 kg/m2       Assessment:     Well-tolerated laser ablation right small saphenous vein performed under local tumescent anesthesia for chronic skin changes and chronic edema     Plan:     Patient return in one week for venous duplex exam to confirm closure right small saphenous vein Will then proceed in the near future for similar procedure on the left great saphenous vein

## 2014-04-29 ENCOUNTER — Telehealth: Payer: Self-pay | Admitting: *Deleted

## 2014-04-29 NOTE — Telephone Encounter (Signed)
Pt doing well. No pain or problems. Following all instructions. Reminded him of his fu appts.

## 2014-05-02 ENCOUNTER — Encounter: Payer: Self-pay | Admitting: Vascular Surgery

## 2014-05-05 ENCOUNTER — Ambulatory Visit (HOSPITAL_COMMUNITY)
Admission: RE | Admit: 2014-05-05 | Discharge: 2014-05-05 | Disposition: A | Discharge status: Home / Self Care | Payer: Medicare Other | Source: Ambulatory Visit | Attending: Vascular Surgery | Admitting: Vascular Surgery

## 2014-05-05 ENCOUNTER — Encounter: Payer: Self-pay | Admitting: Vascular Surgery

## 2014-05-05 ENCOUNTER — Ambulatory Visit (INDEPENDENT_AMBULATORY_CARE_PROVIDER_SITE_OTHER): Payer: Medicare Other | Admitting: Vascular Surgery

## 2014-05-05 VITALS — BP 154/73 | HR 71 | Resp 18 | Ht 72.0 in | Wt 270.0 lb

## 2014-05-05 DIAGNOSIS — L97319 Non-pressure chronic ulcer of right ankle with unspecified severity: Principal | ICD-10-CM

## 2014-05-05 DIAGNOSIS — I83212 Varicose veins of right lower extremity with both ulcer of calf and inflammation: Secondary | ICD-10-CM

## 2014-05-05 DIAGNOSIS — I83218 Varicose veins of right lower extremity with both ulcer of other part of lower extremity and inflammation: Secondary | ICD-10-CM

## 2014-05-05 DIAGNOSIS — I83211 Varicose veins of right lower extremity with both ulcer of thigh and inflammation: Secondary | ICD-10-CM | POA: Insufficient documentation

## 2014-05-05 DIAGNOSIS — I83214 Varicose veins of right lower extremity with both ulcer of heel and midfoot and inflammation: Secondary | ICD-10-CM

## 2014-05-05 DIAGNOSIS — I83213 Varicose veins of right lower extremity with both ulcer of ankle and inflammation: Secondary | ICD-10-CM

## 2014-05-05 DIAGNOSIS — I83219 Varicose veins of right lower extremity with both ulcer of unspecified site and inflammation: Secondary | ICD-10-CM

## 2014-05-05 DIAGNOSIS — I83215 Varicose veins of right lower extremity with both ulcer other part of foot and inflammation: Secondary | ICD-10-CM

## 2014-05-05 DIAGNOSIS — I83013 Varicose veins of right lower extremity with ulcer of ankle: Secondary | ICD-10-CM

## 2014-05-05 NOTE — Progress Notes (Signed)
Subjective:     Patient ID: Jordan [name], male   DOB: Sep 20, 1950, 63 y.o.   MRN: [id number]  HPI tthis 63 year old male returns 1 week post laser ablation right small saphenous vein for severe reflux with history of bilateral venous stasis ulcers. He has had mild discomfort in the right calf following the procedure. He has had no change in distal edema. He has worn his compression wraps as instructed and taking ibuprofen.  Past Medical History  Diagnosis Date  . Anxiety   . Arthritis   . COPD (chronic obstructive pulmonary disease)   . Depression   . Diabetes mellitus without complication   . GERD (gastroesophageal reflux disease)   . Hypertension   . Migraine   . Hyperlipidemia   . Obesity   . Sleep apnea     CPAP     History  Substance Use Topics  . Smoking status: Former Smoker -- 2.00 packs/day for 20 years    Types: Cigarettes    Quit date: 06/20/1988  . Smokeless tobacco: Never Used  . Alcohol Use: No    Family History  Problem Relation Age of Onset  . Hypertension Mother   . Stroke Father     x 3  . Diabetes      mat great aunts  . Colon cancer Neg Hx   . Rectal cancer Neg Hx   . Alzheimer's disease Mother   . Heart attack Father   . Congestive Heart Failure Father   . Esophageal cancer Neg Hx     Allergies  Allergen Reactions  . Altace [Ramipril]     Dizziness, migraine, weakness  . Codeine Itching  . Naproxen Itching    Current outpatient prescriptions: aliskiren (TEKTURNA) 300 MG tablet, Take 300 mg by mouth daily., Disp: , Rfl: ;  aspirin 81 MG tablet, Take 81 mg by mouth daily., Disp: , Rfl: ;  atenolol (TENORMIN) 50 MG tablet, Take 50 mg by mouth daily., Disp: , Rfl: ;  atorvastatin (LIPITOR) 80 MG tablet, Take 80 mg by mouth daily., Disp: , Rfl:  butalbital-acetaminophen-caffeine (FIORICET, ESGIC) 50-325-40 MG per tablet, Take 1 tablet by mouth 2 (two) times daily as needed for pain or headache., Disp: , Rfl: ;  furosemide (LASIX) 40 MG tablet,  Take 40 mg by mouth as needed. , Disp: , Rfl: ;  LORazepam (ATIVAN) 1 MG tablet, Take 1 mg by mouth every 8 (eight) hours as needed for anxiety., Disp: , Rfl: ;  metFORMIN (GLUCOPHAGE) 500 MG tablet, Take 500 mg by mouth daily., Disp: , Rfl:  methocarbamol (ROBAXIN) 500 MG tablet, Take 500 mg by mouth 4 (four) times daily as needed (for muscle spasm.)., Disp: , Rfl: ;  Multiple Vitamin (MULTIVITAMIN WITH MINERALS) TABS, Take 1 tablet by mouth daily., Disp: , Rfl: ;  omeprazole (PRILOSEC) 20 MG capsule, Take 20 mg by mouth daily., Disp: , Rfl: ;  perphenazine (TRILAFON) 2 MG tablet, Take 2 mg by mouth daily., Disp: , Rfl:  pioglitazone (ACTOS) 45 MG tablet, Take 15 mg by mouth daily. Take 1/2 tablet daily, Disp: , Rfl: ;  promethazine (PHENERGAN) 25 MG tablet, Take 25 mg by mouth every 4 (four) hours as needed for nausea., Disp: , Rfl: ;  venlafaxine XR (EFFEXOR-XR) 75 MG 24 hr capsule, Take 75-150 mg by mouth 2 (two) times daily. 1 cap in am, 2 caps in pm., Disp: , Rfl:   BP 154/73 mmHg  Pulse 71  Resp 18  Ht 6' (1.829 m)  Wt 270 lb (122.471 kg)  BMI 36.61 kg/m2  Body mass index is 36.61 kg/(m^2).          Review of SystemsDenies any new chest pain, dyspnea on exertion, PND, orthopnea, hemoptysis.     Objective:   Physical Exam BP 154/73 mmHg  Pulse 71  Resp 18  Ht 6' (1.829 m)  Wt 270 lb (122.471 kg)  BMI 36.61 kg/m2  Gen. Well-developed well-nourished male in no apparent stress alert and oriented 3 Lungs no rhonchi or wheezing Right leg with mild discomfort to palpation in popliteal fossa and posterior calf. Severe skin changes consistent with his chronic venous insufficiency with chronic 1-2+ edema and hyperpigmentation. 3+ dorsalis pedis pulse palpable.  Today I ordered a venous duplex exam of the right leg which I reviewed and interpreted. There is no DVT. There is total closure of the right small saphenous vein up to near the sapheno- popliteal junction     Assessment:      Successful laser ablation right small saphenous vein for chronic venous insufficiency with gross reflux and history venous stasis ulcer     Plan:     Will proceed with similar procedure left great saphenous vein in near future

## 2014-05-06 DIAGNOSIS — E11621 Type 2 diabetes mellitus with foot ulcer: Secondary | ICD-10-CM | POA: Diagnosis not present

## 2014-05-06 DIAGNOSIS — L97519 Non-pressure chronic ulcer of other part of right foot with unspecified severity: Secondary | ICD-10-CM | POA: Diagnosis not present

## 2014-05-13 ENCOUNTER — Other Ambulatory Visit (HOSPITAL_BASED_OUTPATIENT_CLINIC_OR_DEPARTMENT_OTHER): Payer: Self-pay | Admitting: General Surgery

## 2014-05-13 DIAGNOSIS — E11621 Type 2 diabetes mellitus with foot ulcer: Secondary | ICD-10-CM | POA: Diagnosis not present

## 2014-05-13 DIAGNOSIS — L97519 Non-pressure chronic ulcer of other part of right foot with unspecified severity: Secondary | ICD-10-CM

## 2014-05-13 DIAGNOSIS — L97511 Non-pressure chronic ulcer of other part of right foot limited to breakdown of skin: Secondary | ICD-10-CM

## 2014-05-20 ENCOUNTER — Encounter (HOSPITAL_BASED_OUTPATIENT_CLINIC_OR_DEPARTMENT_OTHER): Payer: Medicare Other | Attending: General Surgery

## 2014-05-20 ENCOUNTER — Ambulatory Visit (HOSPITAL_COMMUNITY)
Admission: RE | Admit: 2014-05-20 | Discharge: 2014-05-20 | Disposition: A | Discharge status: Home / Self Care | Payer: Medicare Other | Source: Ambulatory Visit | Attending: General Surgery | Admitting: General Surgery

## 2014-05-20 ENCOUNTER — Other Ambulatory Visit (HOSPITAL_BASED_OUTPATIENT_CLINIC_OR_DEPARTMENT_OTHER): Payer: Self-pay | Admitting: General Surgery

## 2014-05-20 ENCOUNTER — Ambulatory Visit (HOSPITAL_COMMUNITY): Payer: Medicare Other

## 2014-05-20 DIAGNOSIS — E119 Type 2 diabetes mellitus without complications: Secondary | ICD-10-CM | POA: Insufficient documentation

## 2014-05-20 DIAGNOSIS — E11621 Type 2 diabetes mellitus with foot ulcer: Secondary | ICD-10-CM | POA: Diagnosis not present

## 2014-05-20 DIAGNOSIS — M869 Osteomyelitis, unspecified: Secondary | ICD-10-CM | POA: Diagnosis not present

## 2014-05-20 DIAGNOSIS — L97511 Non-pressure chronic ulcer of other part of right foot limited to breakdown of skin: Secondary | ICD-10-CM | POA: Diagnosis not present

## 2014-05-28 ENCOUNTER — Encounter: Payer: Self-pay | Admitting: Vascular Surgery

## 2014-06-03 DIAGNOSIS — L97511 Non-pressure chronic ulcer of other part of right foot limited to breakdown of skin: Secondary | ICD-10-CM | POA: Diagnosis not present

## 2014-06-03 DIAGNOSIS — E11621 Type 2 diabetes mellitus with foot ulcer: Secondary | ICD-10-CM | POA: Diagnosis not present

## 2014-06-05 ENCOUNTER — Other Ambulatory Visit (HOSPITAL_BASED_OUTPATIENT_CLINIC_OR_DEPARTMENT_OTHER): Payer: Self-pay | Admitting: General Surgery

## 2014-06-05 DIAGNOSIS — M869 Osteomyelitis, unspecified: Secondary | ICD-10-CM

## 2014-06-16 ENCOUNTER — Ambulatory Visit (HOSPITAL_COMMUNITY): Payer: Medicare Other

## 2014-06-17 ENCOUNTER — Ambulatory Visit (HOSPITAL_COMMUNITY)
Admission: RE | Admit: 2014-06-17 | Discharge: 2014-06-17 | Disposition: A | Discharge status: Home / Self Care | Payer: Medicare Other | Source: Ambulatory Visit | Attending: General Surgery | Admitting: General Surgery

## 2014-06-17 DIAGNOSIS — E11621 Type 2 diabetes mellitus with foot ulcer: Secondary | ICD-10-CM | POA: Diagnosis not present

## 2014-06-17 DIAGNOSIS — L97519 Non-pressure chronic ulcer of other part of right foot with unspecified severity: Secondary | ICD-10-CM | POA: Diagnosis present

## 2014-06-17 DIAGNOSIS — L97511 Non-pressure chronic ulcer of other part of right foot limited to breakdown of skin: Secondary | ICD-10-CM | POA: Diagnosis not present

## 2014-06-17 DIAGNOSIS — M869 Osteomyelitis, unspecified: Secondary | ICD-10-CM

## 2014-06-17 MED ORDER — IOHEXOL 300 MG/ML  SOLN
100.0000 mL | Freq: Once | INTRAMUSCULAR | Status: AC | PRN
  Administered 2014-06-17: 100 mL via INTRAVENOUS

## 2014-06-18 ENCOUNTER — Other Ambulatory Visit (HOSPITAL_BASED_OUTPATIENT_CLINIC_OR_DEPARTMENT_OTHER): Payer: Self-pay | Admitting: General Surgery

## 2014-06-18 DIAGNOSIS — M869 Osteomyelitis, unspecified: Secondary | ICD-10-CM

## 2014-06-23 ENCOUNTER — Other Ambulatory Visit: Payer: Self-pay | Admitting: *Deleted

## 2014-06-23 DIAGNOSIS — I83892 Varicose veins of left lower extremities with other complications: Secondary | ICD-10-CM

## 2014-06-24 ENCOUNTER — Encounter (HOSPITAL_BASED_OUTPATIENT_CLINIC_OR_DEPARTMENT_OTHER): Payer: Medicare Other | Attending: General Surgery

## 2014-06-24 DIAGNOSIS — E11621 Type 2 diabetes mellitus with foot ulcer: Secondary | ICD-10-CM | POA: Diagnosis not present

## 2014-06-24 DIAGNOSIS — L97511 Non-pressure chronic ulcer of other part of right foot limited to breakdown of skin: Secondary | ICD-10-CM | POA: Insufficient documentation

## 2014-06-24 DIAGNOSIS — L97821 Non-pressure chronic ulcer of other part of left lower leg limited to breakdown of skin: Secondary | ICD-10-CM | POA: Insufficient documentation

## 2014-06-24 DIAGNOSIS — I87332 Chronic venous hypertension (idiopathic) with ulcer and inflammation of left lower extremity: Secondary | ICD-10-CM | POA: Insufficient documentation

## 2014-06-27 ENCOUNTER — Encounter: Payer: Self-pay | Admitting: Vascular Surgery

## 2014-06-30 ENCOUNTER — Ambulatory Visit (INDEPENDENT_AMBULATORY_CARE_PROVIDER_SITE_OTHER): Payer: Medicare Other | Admitting: Vascular Surgery

## 2014-06-30 ENCOUNTER — Encounter: Payer: Self-pay | Admitting: Vascular Surgery

## 2014-06-30 VITALS — BP 159/71 | HR 69 | Resp 16 | Ht 73.0 in | Wt 265.0 lb

## 2014-06-30 DIAGNOSIS — I872 Venous insufficiency (chronic) (peripheral): Secondary | ICD-10-CM

## 2014-06-30 DIAGNOSIS — I83892 Varicose veins of left lower extremities with other complications: Secondary | ICD-10-CM

## 2014-06-30 NOTE — Progress Notes (Signed)
Subjective:     Patient ID: Jordan [name], male   DOB: [birth year], 64 y.o.   MRN: [id number]  HPI this 64 year old male had laser ablation of the left great saphenous vein from the distal thigh to near the saphenofemoral junction performed under local tumescent anesthesia. He tolerated the procedure well. Review of Systems     Objective:   Physical Exam BP 159/71 mmHg  Pulse 69  Resp 16  Ht 6\' 1"  (1.854 m)  Wt 265 lb (120.203 kg)  BMI 34.97 kg/m2       Assessment:     Well-tolerated laser ablation left great saphenous vein from distal thigh to near the saphenofemoral junction performed under local tumescent anesthesia. A total of 1990 J of energy was utilized.    Plan:     Return in one week for venous duplex exam to confirm closure left great saphenous vein in patient will return to wound center for continued care
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
## 2014-07-01 ENCOUNTER — Telehealth: Payer: Self-pay | Admitting: *Deleted

## 2014-07-01 NOTE — Telephone Encounter (Signed)
Asked the pt to call me if he is having any concerns.

## 2014-07-04 ENCOUNTER — Encounter: Payer: Self-pay | Admitting: Vascular Surgery

## 2014-07-07 ENCOUNTER — Inpatient Hospital Stay (HOSPITAL_COMMUNITY): Admission: RE | Admit: 2014-07-07 | Payer: Medicare Other | Source: Ambulatory Visit

## 2014-07-07 ENCOUNTER — Ambulatory Visit: Payer: Medicare Other | Admitting: Vascular Surgery

## 2014-07-10 ENCOUNTER — Encounter: Payer: Self-pay | Admitting: Vascular Surgery

## 2014-07-14 ENCOUNTER — Ambulatory Visit (HOSPITAL_COMMUNITY)
Admission: RE | Admit: 2014-07-14 | Discharge: 2014-07-14 | Disposition: A | Discharge status: Home / Self Care | Payer: Medicare Other | Source: Ambulatory Visit | Attending: Vascular Surgery | Admitting: Vascular Surgery

## 2014-07-14 ENCOUNTER — Encounter: Payer: Self-pay | Admitting: Vascular Surgery

## 2014-07-14 ENCOUNTER — Ambulatory Visit (INDEPENDENT_AMBULATORY_CARE_PROVIDER_SITE_OTHER): Payer: Medicare Other | Admitting: Vascular Surgery

## 2014-07-14 VITALS — BP 162/67 | HR 67 | Resp 14 | Ht 73.0 in | Wt 260.0 lb

## 2014-07-14 DIAGNOSIS — I83893 Varicose veins of bilateral lower extremities with other complications: Secondary | ICD-10-CM

## 2014-07-14 DIAGNOSIS — I83892 Varicose veins of left lower extremities with other complications: Secondary | ICD-10-CM | POA: Insufficient documentation

## 2014-07-14 NOTE — Progress Notes (Signed)
Subjective:     Patient ID: Jordan [name], male   DOB: 01-Jul-1950, 64 y.o.   MRN: [id number]  HPI this 64 year old male returns 2 weeks post laser ablation left great saphenous vein for gross reflux with recurrent venous stasis ulcer lateral aspect left leg. He had also undergone laser ablation right small saphenous vein. He has been treated in the wound center for the last several months. He has had significant edema in the legs. He states that the discomfort in the left thigh and groin area has resolved. He has taken ibuprofen as instructed. His legs have been treated with wraps.  Past Medical History  Diagnosis Date  . Anxiety   . Arthritis   . COPD (chronic obstructive pulmonary disease)   . Depression   . Diabetes mellitus without complication   . GERD (gastroesophageal reflux disease)   . Hypertension   . Migraine   . Hyperlipidemia   . Obesity   . Sleep apnea     CPAP   . Varicose veins     History  Substance Use Topics  . Smoking status: Former Smoker -- 2.00 packs/day for 20 years    Types: Cigarettes    Quit date: 06/20/1988  . Smokeless tobacco: Never Used  . Alcohol Use: No    Family History  Problem Relation Age of Onset  . Hypertension Mother   . Stroke Father     x 3  . Diabetes      mat great aunts  . Colon cancer Neg Hx   . Rectal cancer Neg Hx   . Alzheimer's disease Mother   . Heart attack Father   . Congestive Heart Failure Father   . Esophageal cancer Neg Hx     Allergies  Allergen Reactions  . Altace [Ramipril]     Dizziness, migraine, weakness  . Codeine Itching  . Naproxen Itching     Current outpatient prescriptions:  .  aliskiren (TEKTURNA) 300 MG tablet, Take 300 mg by mouth daily., Disp: , Rfl:  .  aspirin 81 MG tablet, Take 81 mg by mouth daily., Disp: , Rfl:  .  atenolol (TENORMIN) 50 MG tablet, Take 50 mg by mouth daily., Disp: , Rfl:  .  atorvastatin (LIPITOR) 80 MG tablet, Take 80 mg by mouth daily., Disp: , Rfl:  .   butalbital-acetaminophen-caffeine (FIORICET, ESGIC) 50-325-40 MG per tablet, Take 1 tablet by mouth 2 (two) times daily as needed for pain or headache., Disp: , Rfl:  .  furosemide (LASIX) 40 MG tablet, Take 40 mg by mouth as needed. , Disp: , Rfl:  .  LORazepam (ATIVAN) 1 MG tablet, Take 1 mg by mouth every 8 (eight) hours as needed for anxiety., Disp: , Rfl:  .  metFORMIN (GLUCOPHAGE) 500 MG tablet, Take 500 mg by mouth daily., Disp: , Rfl:  .  methocarbamol (ROBAXIN) 500 MG tablet, Take 500 mg by mouth 4 (four) times daily as needed (for muscle spasm.)., Disp: , Rfl:  .  Multiple Vitamin (MULTIVITAMIN WITH MINERALS) TABS, Take 1 tablet by mouth daily., Disp: , Rfl:  .  omeprazole (PRILOSEC) 20 MG capsule, Take 20 mg by mouth daily., Disp: , Rfl:  .  perphenazine (TRILAFON) 2 MG tablet, Take 2 mg by mouth daily., Disp: , Rfl:  .  pioglitazone (ACTOS) 45 MG tablet, Take 15 mg by mouth daily. Take 1/2 tablet daily, Disp: , Rfl:  .  promethazine (PHENERGAN) 25 MG tablet, Take 25 mg by mouth every 4 (four)  hours as needed for nausea., Disp: , Rfl:  .  venlafaxine XR (EFFEXOR-XR) 75 MG 24 hr capsule, Take 75-150 mg by mouth 2 (two) times daily. 1 cap in am, 2 caps in pm., Disp: , Rfl:   BP 162/67 mmHg  Pulse 67  Resp 14  Ht 6\' 1"  (1.854 m)  Wt 260 lb (117.935 kg)  BMI 34.31 kg/m2  Body mass index is 34.31 kg/(m^2).           Review of Systems denies chest pain but does have chronic dyspnea on exertion and sleep apnea. Denies hemoptysis or claudication.     Objective:   Physical Exam BP 162/67 mmHg  Pulse 67  Resp 14  Ht 6\' 1"  (1.854 m)  Wt 260 lb (117.935 kg)  BMI 34.31 kg/m2  Gen. well-developed well-nourished male no apparent stress alert and oriented 3 Lungs no rhonchi or wheezing Left leg with palpable left great saphenous vein from distal thigh to near the saphenofemoral junction with minimal tenderness. Chronic severe edema-2+ below the knee with hyperpigmentation. He  has compression dressing in place today. Venous ulcer is several centimeters in diameter laterally and he will visit wound center tomorrow for dressing change.  Today I ordered a venous duplex exam the left leg which I reviewed and interpreted. There is no DVT. The left great saphenous vein is totally ablated proximally and there is some minimal flow distally no DVT.     Assessment:     Successful laser ablation left great saphenous and right small saphenous veins with history of gross reflux bilaterally with chronic edema severe skin changes and ulcerations    Plan:     Patient to return to the wound center tomorrow for continued wound care and return to CS on when necessary basis

## 2014-07-15 DIAGNOSIS — E11621 Type 2 diabetes mellitus with foot ulcer: Secondary | ICD-10-CM | POA: Diagnosis not present

## 2014-07-15 DIAGNOSIS — L97821 Non-pressure chronic ulcer of other part of left lower leg limited to breakdown of skin: Secondary | ICD-10-CM | POA: Diagnosis not present

## 2014-07-15 DIAGNOSIS — I87332 Chronic venous hypertension (idiopathic) with ulcer and inflammation of left lower extremity: Secondary | ICD-10-CM | POA: Diagnosis not present

## 2014-07-15 DIAGNOSIS — L97511 Non-pressure chronic ulcer of other part of right foot limited to breakdown of skin: Secondary | ICD-10-CM | POA: Diagnosis not present

## 2014-07-22 ENCOUNTER — Encounter (HOSPITAL_BASED_OUTPATIENT_CLINIC_OR_DEPARTMENT_OTHER): Payer: Medicare Other | Attending: General Surgery

## 2014-07-22 DIAGNOSIS — L97512 Non-pressure chronic ulcer of other part of right foot with fat layer exposed: Secondary | ICD-10-CM | POA: Insufficient documentation

## 2014-07-22 DIAGNOSIS — L97822 Non-pressure chronic ulcer of other part of left lower leg with fat layer exposed: Secondary | ICD-10-CM | POA: Insufficient documentation

## 2014-07-22 DIAGNOSIS — E11621 Type 2 diabetes mellitus with foot ulcer: Secondary | ICD-10-CM | POA: Insufficient documentation

## 2014-07-29 DIAGNOSIS — L97512 Non-pressure chronic ulcer of other part of right foot with fat layer exposed: Secondary | ICD-10-CM | POA: Diagnosis not present

## 2014-07-29 DIAGNOSIS — E11621 Type 2 diabetes mellitus with foot ulcer: Secondary | ICD-10-CM | POA: Diagnosis present

## 2014-07-29 DIAGNOSIS — L97822 Non-pressure chronic ulcer of other part of left lower leg with fat layer exposed: Secondary | ICD-10-CM | POA: Diagnosis not present

## 2014-08-19 ENCOUNTER — Encounter (HOSPITAL_BASED_OUTPATIENT_CLINIC_OR_DEPARTMENT_OTHER): Payer: Medicare Other | Attending: General Surgery

## 2014-08-19 DIAGNOSIS — L97511 Non-pressure chronic ulcer of other part of right foot limited to breakdown of skin: Secondary | ICD-10-CM | POA: Diagnosis not present

## 2014-08-19 DIAGNOSIS — E11621 Type 2 diabetes mellitus with foot ulcer: Secondary | ICD-10-CM | POA: Insufficient documentation

## 2014-08-19 DIAGNOSIS — I87332 Chronic venous hypertension (idiopathic) with ulcer and inflammation of left lower extremity: Secondary | ICD-10-CM | POA: Insufficient documentation

## 2014-08-19 DIAGNOSIS — L97821 Non-pressure chronic ulcer of other part of left lower leg limited to breakdown of skin: Secondary | ICD-10-CM | POA: Insufficient documentation

## 2014-08-21 IMAGING — CR DG TOE GREAT 2+V*R*
4 series · 4 of 4 positions shown · non-contrast
Comparison: None.

CLINICAL DATA: Great toe lesion

EXAM:
RIGHT GREAT TOE

[t toes ap right]
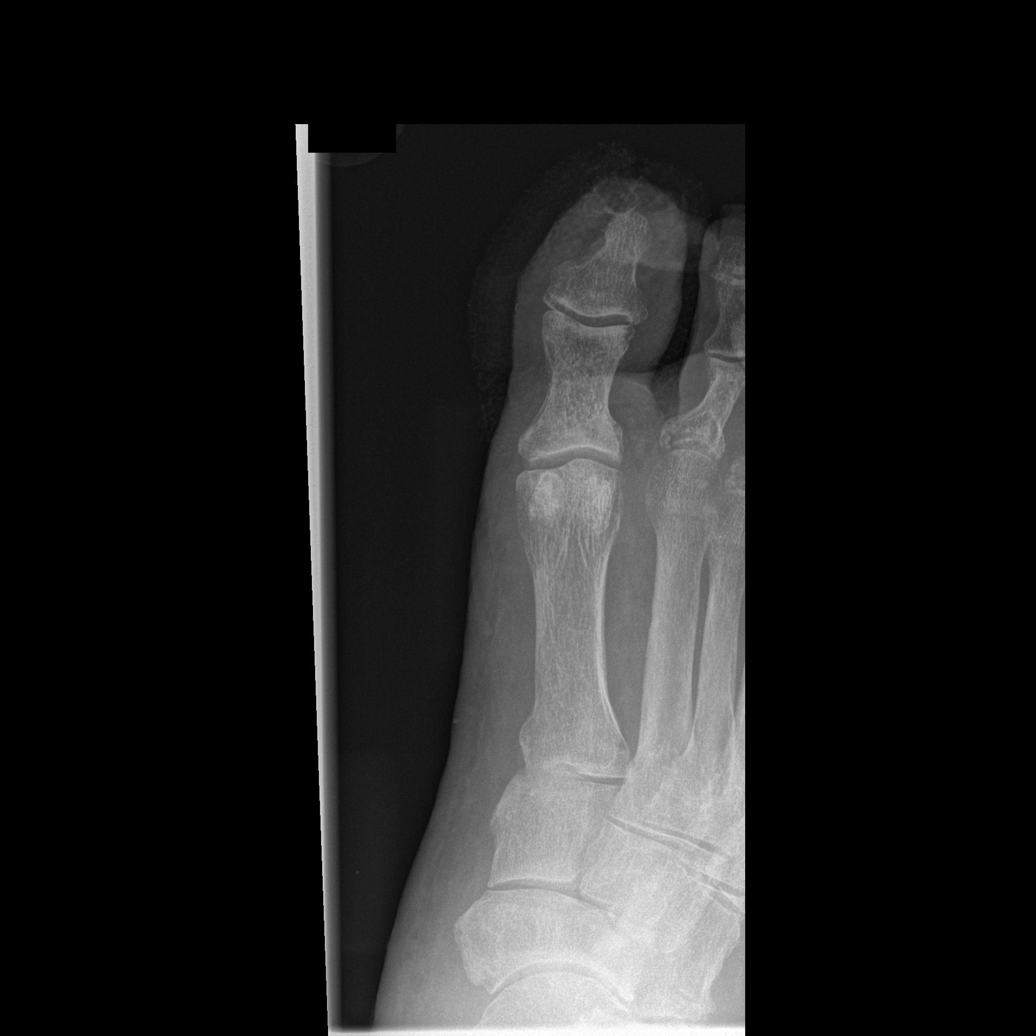

[t toes oblique right]
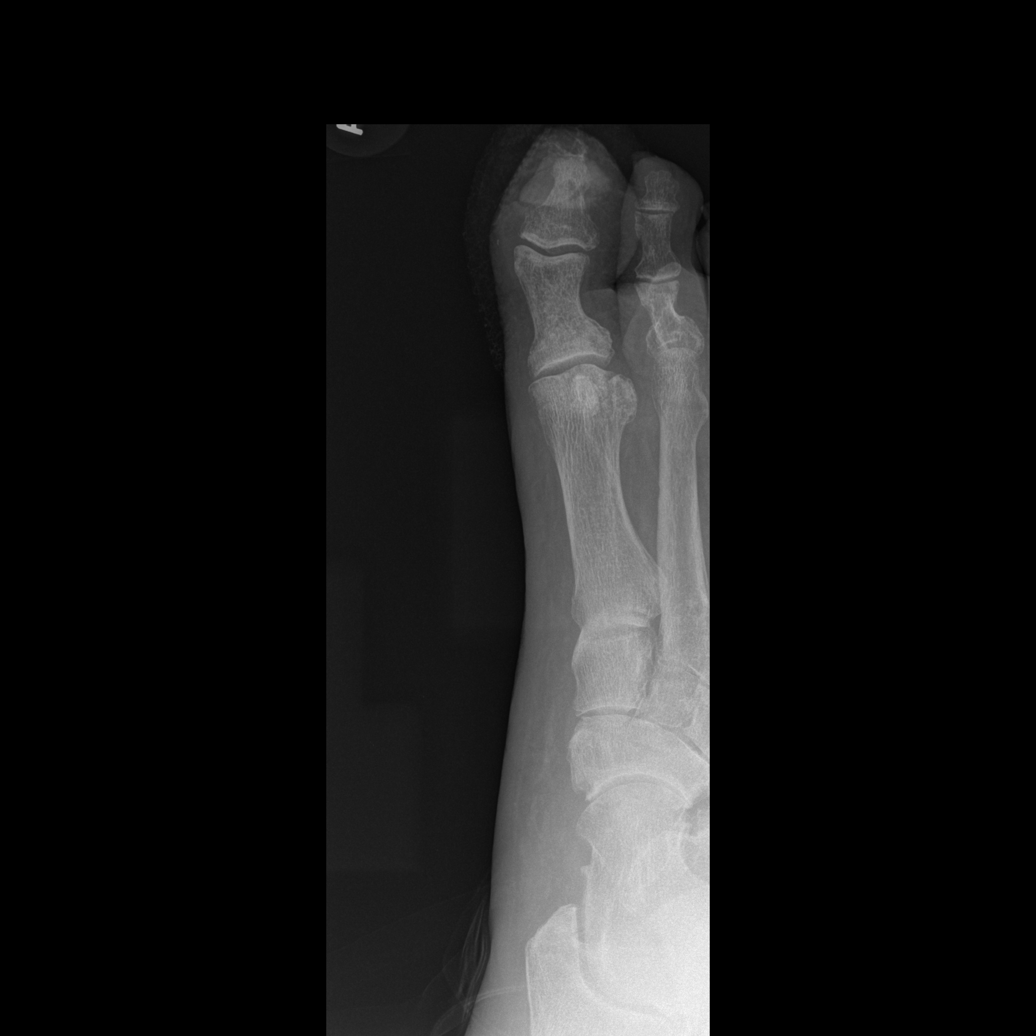

[t toes lateral right (1 of 2)]
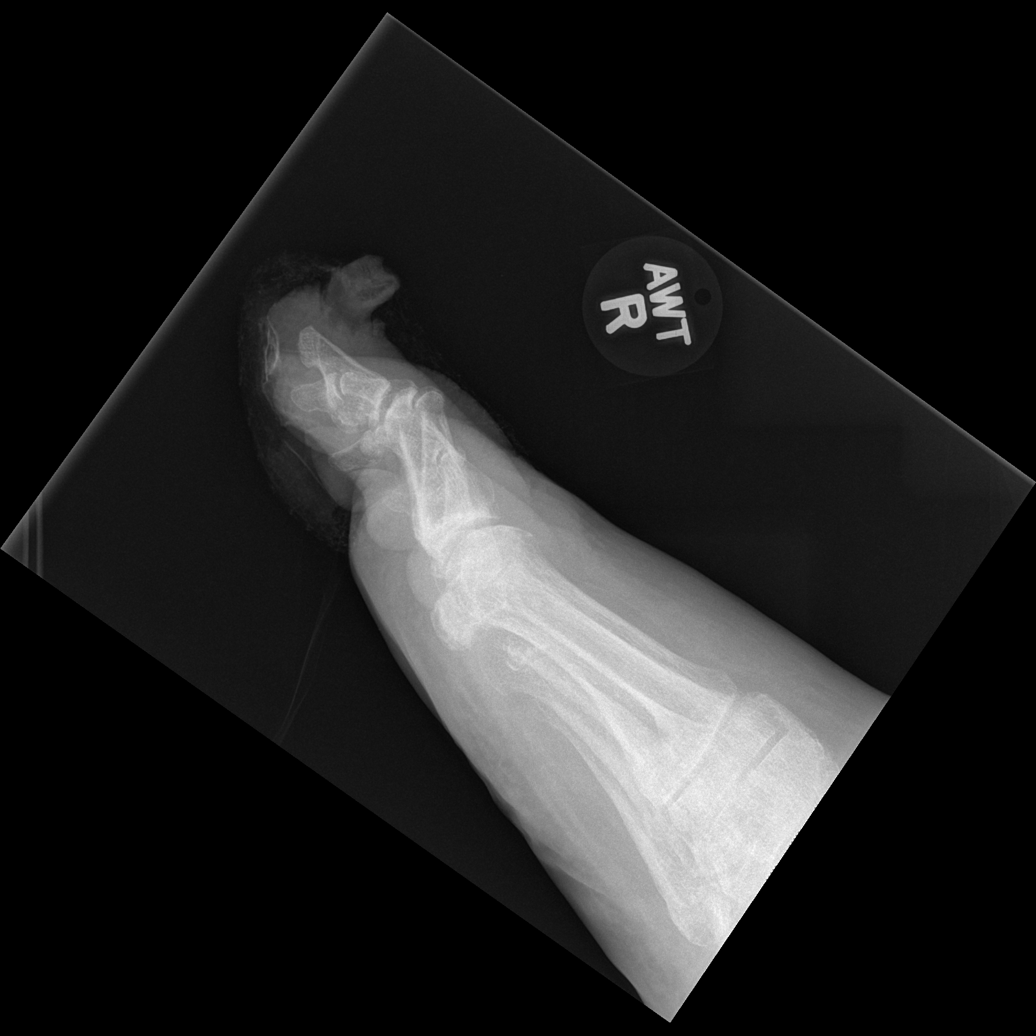

[t toes lateral right (2 of 2)]
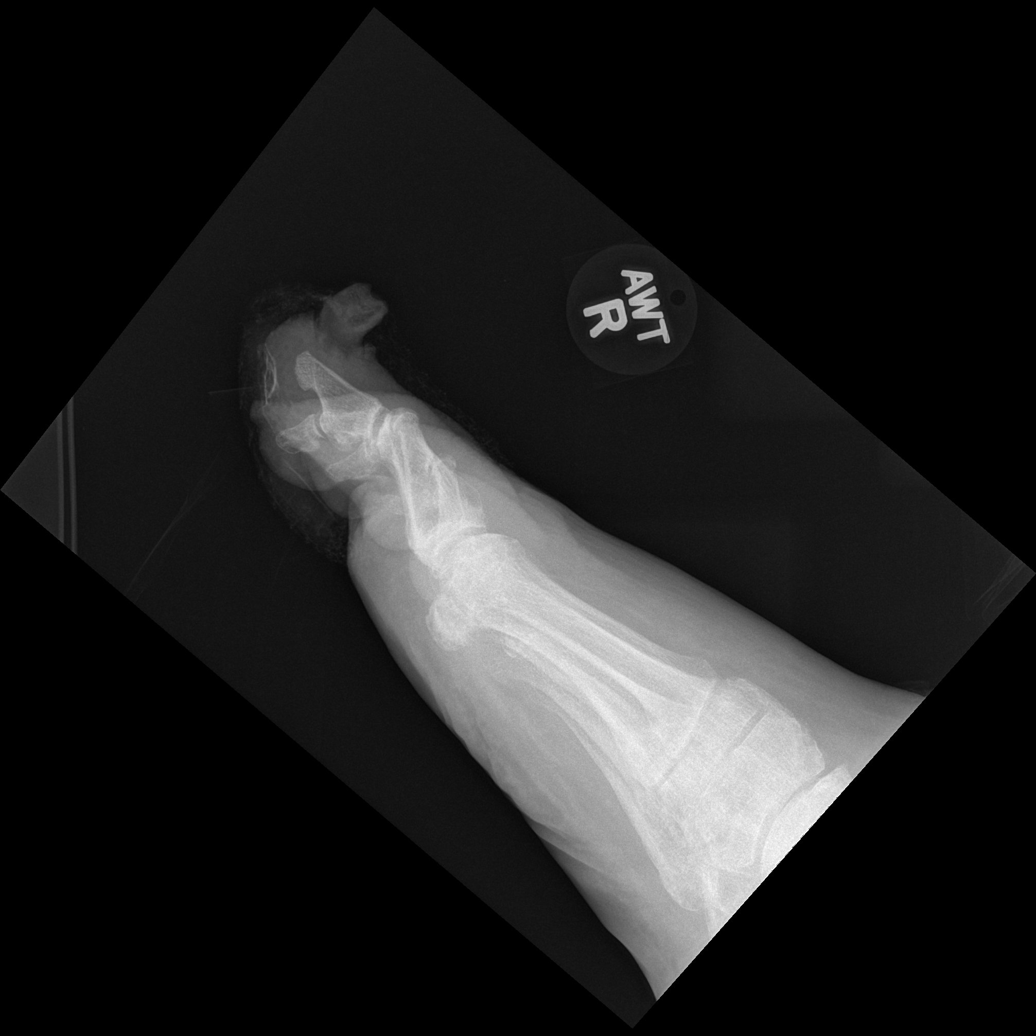

[4 of 4 positions shown; findings below may reference images not displayed]

FINDINGS: Bandages around the distal phalanx right great toe. No focal
cortical loss to suggest osteomyelitis. Normal mineralization and
alignment. No significant osseous degenerative change. Negative for
fracture, dislocation, or other acute bone abnormality.
IMPRESSION: 1. No focal bone abnormality.

## 2014-09-02 DIAGNOSIS — L97821 Non-pressure chronic ulcer of other part of left lower leg limited to breakdown of skin: Secondary | ICD-10-CM | POA: Diagnosis not present

## 2014-09-02 DIAGNOSIS — L97511 Non-pressure chronic ulcer of other part of right foot limited to breakdown of skin: Secondary | ICD-10-CM | POA: Diagnosis not present

## 2014-09-02 DIAGNOSIS — I87332 Chronic venous hypertension (idiopathic) with ulcer and inflammation of left lower extremity: Secondary | ICD-10-CM | POA: Diagnosis not present

## 2014-09-02 DIAGNOSIS — E11621 Type 2 diabetes mellitus with foot ulcer: Secondary | ICD-10-CM | POA: Diagnosis not present

## 2014-09-22 ENCOUNTER — Encounter (HOSPITAL_COMMUNITY): Payer: Medicare Other

## 2014-09-29 ENCOUNTER — Other Ambulatory Visit (HOSPITAL_COMMUNITY): Payer: Self-pay | Admitting: *Deleted

## 2014-09-30 ENCOUNTER — Encounter (HOSPITAL_COMMUNITY)
Admission: RE | Admit: 2014-09-30 | Discharge: 2014-09-30 | Disposition: A | Discharge status: Home / Self Care | Payer: Medicare Other | Source: Ambulatory Visit | Attending: Endocrinology | Admitting: Endocrinology

## 2014-09-30 DIAGNOSIS — D649 Anemia, unspecified: Secondary | ICD-10-CM | POA: Insufficient documentation

## 2014-09-30 MED ORDER — SODIUM CHLORIDE 0.9 % IV SOLN
510.0000 mg | INTRAVENOUS | Status: DC
  Administered 2014-09-30: 510 mg via INTRAVENOUS
  Filled 2014-09-30: qty 17

## 2014-10-07 ENCOUNTER — Encounter (HOSPITAL_COMMUNITY)
Admission: RE | Admit: 2014-10-07 | Discharge: 2014-10-07 | Disposition: A | Discharge status: Home / Self Care | Payer: Medicare Other | Source: Ambulatory Visit | Attending: Endocrinology | Admitting: Endocrinology

## 2014-10-07 DIAGNOSIS — D649 Anemia, unspecified: Secondary | ICD-10-CM | POA: Diagnosis not present

## 2014-10-07 MED ORDER — SODIUM CHLORIDE 0.9 % IV SOLN
510.0000 mg | INTRAVENOUS | Status: DC
  Administered 2014-10-07: 510 mg via INTRAVENOUS
  Filled 2014-10-07: qty 17

## 2014-10-30 ENCOUNTER — Encounter (HOSPITAL_COMMUNITY): Payer: Self-pay

## 2014-10-30 ENCOUNTER — Inpatient Hospital Stay (HOSPITAL_COMMUNITY)
Admission: EM | Admit: 2014-10-30 | Discharge: 2014-11-04 | DRG: 603 | Disposition: A | Discharge status: Home / Self Care | Payer: Medicare Other | Attending: Internal Medicine | Admitting: Internal Medicine

## 2014-10-30 ENCOUNTER — Encounter (HOSPITAL_COMMUNITY): Payer: Medicare Other

## 2014-10-30 ENCOUNTER — Emergency Department (HOSPITAL_BASED_OUTPATIENT_CLINIC_OR_DEPARTMENT_OTHER): Payer: Medicare Other

## 2014-10-30 DIAGNOSIS — E876 Hypokalemia: Secondary | ICD-10-CM | POA: Diagnosis present

## 2014-10-30 DIAGNOSIS — E1151 Type 2 diabetes mellitus with diabetic peripheral angiopathy without gangrene: Secondary | ICD-10-CM | POA: Diagnosis present

## 2014-10-30 DIAGNOSIS — M79604 Pain in right leg: Secondary | ICD-10-CM | POA: Diagnosis not present

## 2014-10-30 DIAGNOSIS — Z87891 Personal history of nicotine dependence: Secondary | ICD-10-CM | POA: Diagnosis not present

## 2014-10-30 DIAGNOSIS — F329 Major depressive disorder, single episode, unspecified: Secondary | ICD-10-CM | POA: Diagnosis present

## 2014-10-30 DIAGNOSIS — Z7982 Long term (current) use of aspirin: Secondary | ICD-10-CM | POA: Diagnosis not present

## 2014-10-30 DIAGNOSIS — L03115 Cellulitis of right lower limb: Principal | ICD-10-CM | POA: Diagnosis present

## 2014-10-30 DIAGNOSIS — J449 Chronic obstructive pulmonary disease, unspecified: Secondary | ICD-10-CM | POA: Diagnosis present

## 2014-10-30 DIAGNOSIS — K219 Gastro-esophageal reflux disease without esophagitis: Secondary | ICD-10-CM | POA: Diagnosis present

## 2014-10-30 DIAGNOSIS — D509 Iron deficiency anemia, unspecified: Secondary | ICD-10-CM | POA: Diagnosis present

## 2014-10-30 DIAGNOSIS — G43909 Migraine, unspecified, not intractable, without status migrainosus: Secondary | ICD-10-CM | POA: Diagnosis present

## 2014-10-30 DIAGNOSIS — I739 Peripheral vascular disease, unspecified: Secondary | ICD-10-CM | POA: Diagnosis present

## 2014-10-30 DIAGNOSIS — Z886 Allergy status to analgesic agent status: Secondary | ICD-10-CM

## 2014-10-30 DIAGNOSIS — E785 Hyperlipidemia, unspecified: Secondary | ICD-10-CM | POA: Diagnosis present

## 2014-10-30 DIAGNOSIS — E119 Type 2 diabetes mellitus without complications: Secondary | ICD-10-CM

## 2014-10-30 DIAGNOSIS — F419 Anxiety disorder, unspecified: Secondary | ICD-10-CM | POA: Diagnosis present

## 2014-10-30 DIAGNOSIS — I872 Venous insufficiency (chronic) (peripheral): Secondary | ICD-10-CM | POA: Diagnosis not present

## 2014-10-30 DIAGNOSIS — I10 Essential (primary) hypertension: Secondary | ICD-10-CM | POA: Diagnosis present

## 2014-10-30 DIAGNOSIS — G4730 Sleep apnea, unspecified: Secondary | ICD-10-CM | POA: Diagnosis present

## 2014-10-30 DIAGNOSIS — E1159 Type 2 diabetes mellitus with other circulatory complications: Secondary | ICD-10-CM | POA: Diagnosis not present

## 2014-10-30 DIAGNOSIS — M1990 Unspecified osteoarthritis, unspecified site: Secondary | ICD-10-CM | POA: Diagnosis present

## 2014-10-30 DIAGNOSIS — Z888 Allergy status to other drugs, medicaments and biological substances status: Secondary | ICD-10-CM

## 2014-10-30 DIAGNOSIS — E11649 Type 2 diabetes mellitus with hypoglycemia without coma: Secondary | ICD-10-CM | POA: Diagnosis present

## 2014-10-30 LAB — CBC
HCT: 39.5 % (ref 39.0–52.0)
HEMOGLOBIN: 12.2 g/dL — AB (ref 13.0–17.0)
MCH: 23.8 pg — AB (ref 26.0–34.0)
MCHC: 30.9 g/dL (ref 30.0–36.0)
MCV: 77 fL — AB (ref 78.0–100.0)
PLATELETS: 208 10*3/uL (ref 150–400)
RBC: 5.13 MIL/uL (ref 4.22–5.81)
RDW: 26.8 % — ABNORMAL HIGH (ref 11.5–15.5)
WBC: 6.2 10*3/uL (ref 4.0–10.5)

## 2014-10-30 LAB — GLUCOSE, CAPILLARY
Glucose-Capillary: 54 mg/dL — ABNORMAL LOW (ref 65–99)
Glucose-Capillary: 120 mg/dL — ABNORMAL HIGH (ref 65–99)
Glucose-Capillary: 38 mg/dL — CL (ref 65–99)

## 2014-10-30 LAB — CBC WITH DIFFERENTIAL/PLATELET
BASOS ABS: 0 10*3/uL (ref 0.0–0.1)
BASOS PCT: 0 % (ref 0–1)
Eosinophils Absolute: 0.2 10*3/uL (ref 0.0–0.7)
Eosinophils Relative: 3 % (ref 0–5)
HCT: 39.6 % (ref 39.0–52.0)
HEMOGLOBIN: 12.6 g/dL — AB (ref 13.0–17.0)
LYMPHS PCT: 21 % (ref 12–46)
Lymphs Abs: 1.5 10*3/uL (ref 0.7–4.0)
MCH: 24 pg — ABNORMAL LOW (ref 26.0–34.0)
MCHC: 31.8 g/dL (ref 30.0–36.0)
MCV: 75.6 fL — ABNORMAL LOW (ref 78.0–100.0)
MONOS PCT: 11 % (ref 3–12)
Monocytes Absolute: 0.8 10*3/uL (ref 0.1–1.0)
NEUTROS ABS: 4.7 10*3/uL (ref 1.7–7.7)
NEUTROS PCT: 65 % (ref 43–77)
Platelets: 222 10*3/uL (ref 150–400)
RBC: 5.24 MIL/uL (ref 4.22–5.81)
RDW: 26.5 % — AB (ref 11.5–15.5)
WBC: 7.2 10*3/uL (ref 4.0–10.5)

## 2014-10-30 LAB — BASIC METABOLIC PANEL
ANION GAP: 5 (ref 5–15)
BUN: 9 mg/dL (ref 6–20)
CHLORIDE: 104 mmol/L (ref 101–111)
CO2: 25 mmol/L (ref 22–32)
CREATININE: 0.79 mg/dL (ref 0.61–1.24)
Calcium: 8.4 mg/dL — ABNORMAL LOW (ref 8.9–10.3)
GFR calc Af Amer: >60 mL/min (ref >60)
GFR calc non Af Amer: >60 mL/min (ref >60)
Glucose, Bld: 93 mg/dL (ref 65–99)
POTASSIUM: 3.6 mmol/L (ref 3.5–5.1)
SODIUM: 134 mmol/L — AB (ref 135–145)

## 2014-10-30 LAB — CREATININE, SERUM: CREATININE: 0.99 mg/dL (ref 0.61–1.24)

## 2014-10-30 LAB — TSH: TSH: 1.035 u[IU]/mL (ref 0.350–4.500)

## 2014-10-30 MED ORDER — OXYCODONE HCL 5 MG PO TABS: 5.0000 mg | ORAL_TABLET | ORAL | Status: DC | PRN

## 2014-10-30 MED ORDER — VANCOMYCIN HCL 10 G IV SOLR
2500.0000 mg | Freq: Once | INTRAVENOUS | Status: AC
  Administered 2014-10-30: 2500 mg via INTRAVENOUS
  Filled 2014-10-30: qty 2500

## 2014-10-30 MED ORDER — INSULIN ASPART 100 UNIT/ML ~~LOC~~ SOLN: 0.0000 [IU] | Freq: Three times a day (TID) | SUBCUTANEOUS | Status: DC

## 2014-10-30 MED ORDER — VENLAFAXINE HCL ER 150 MG PO CP24: 150.0000 mg | ORAL_CAPSULE | Freq: Every day | ORAL | Status: DC

## 2014-10-30 MED ORDER — ACETAMINOPHEN 325 MG PO TABS
650.0000 mg | ORAL_TABLET | Freq: Four times a day (QID) | ORAL | Status: DC | PRN
Start: 2014-10-30 — End: 2014-11-04
  Administered 2014-11-02 – 2014-11-03 (×2): 650 mg via ORAL
  Filled 2014-10-30 (×2): qty 2

## 2014-10-30 MED ORDER — PNEUMOCOCCAL VAC POLYVALENT 25 MCG/0.5ML IJ INJ
0.5000 mL | INJECTION | INTRAMUSCULAR | Status: DC
  Filled 2014-10-30 (×2): qty 0.5

## 2014-10-30 MED ORDER — ASPIRIN EC 81 MG PO TBEC
81.0000 mg | DELAYED_RELEASE_TABLET | Freq: Every day | ORAL | Status: DC
  Administered 2014-10-30 – 2014-11-04 (×6): 81 mg via ORAL
  Filled 2014-10-30 (×6): qty 1

## 2014-10-30 MED ORDER — DEXTROSE 5 % IV SOLN
2.0000 g | Freq: Three times a day (TID) | INTRAVENOUS | Status: DC
  Administered 2014-10-30 – 2014-10-31 (×2): 2 g via INTRAVENOUS
  Filled 2014-10-30 (×2): qty 2

## 2014-10-30 MED ORDER — HEPARIN SODIUM (PORCINE) 5000 UNIT/ML IJ SOLN
5000.0000 [IU] | Freq: Three times a day (TID) | INTRAMUSCULAR | Status: DC
  Administered 2014-10-30 – 2014-11-04 (×14): 5000 [IU] via SUBCUTANEOUS
  Filled 2014-10-30 (×17): qty 1

## 2014-10-30 MED ORDER — ALISKIREN FUMARATE 150 MG PO TABS
300.0000 mg | ORAL_TABLET | Freq: Every day | ORAL | Status: DC
  Administered 2014-10-31 – 2014-11-04 (×5): 300 mg via ORAL
  Filled 2014-10-30 (×5): qty 2

## 2014-10-30 MED ORDER — METHOCARBAMOL 500 MG PO TABS: 500.0000 mg | ORAL_TABLET | Freq: Four times a day (QID) | ORAL | Status: DC | PRN

## 2014-10-30 MED ORDER — DEXTROSE 50 % IV SOLN
INTRAVENOUS | Status: AC
  Administered 2014-10-30: 50 mL
  Filled 2014-10-30: qty 50

## 2014-10-30 MED ORDER — VITAMIN B-1 100 MG PO TABS
100.0000 mg | ORAL_TABLET | Freq: Every day | ORAL | Status: DC
  Administered 2014-10-30 – 2014-11-04 (×6): 100 mg via ORAL
  Filled 2014-10-30 (×6): qty 1

## 2014-10-30 MED ORDER — PROMETHAZINE HCL 25 MG PO TABS: 25.0000 mg | ORAL_TABLET | ORAL | Status: DC | PRN

## 2014-10-30 MED ORDER — ALUM & MAG HYDROXIDE-SIMETH 200-200-20 MG/5ML PO SUSP: 30.0000 mL | Freq: Four times a day (QID) | ORAL | Status: DC | PRN

## 2014-10-30 MED ORDER — LORAZEPAM 1 MG PO TABS
1.0000 mg | ORAL_TABLET | Freq: Three times a day (TID) | ORAL | Status: DC | PRN
  Administered 2014-10-31 – 2014-11-03 (×5): 1 mg via ORAL
  Filled 2014-10-30 (×5): qty 1

## 2014-10-30 MED ORDER — FUROSEMIDE 40 MG PO TABS
40.0000 mg | ORAL_TABLET | Freq: Every day | ORAL | Status: DC | PRN
  Filled 2014-10-30: qty 1

## 2014-10-30 MED ORDER — POLYETHYLENE GLYCOL 3350 17 G PO PACK
17.0000 g | PACK | Freq: Every day | ORAL | Status: DC | PRN
Start: 2014-10-30 — End: 2014-11-04

## 2014-10-30 MED ORDER — DOCUSATE SODIUM 100 MG PO CAPS
100.0000 mg | ORAL_CAPSULE | Freq: Two times a day (BID) | ORAL | Status: DC
  Administered 2014-10-31: 100 mg via ORAL
  Filled 2014-10-30 (×11): qty 1

## 2014-10-30 MED ORDER — ATENOLOL 50 MG PO TABS
50.0000 mg | ORAL_TABLET | Freq: Every day | ORAL | Status: DC
Start: 2014-10-30 — End: 2014-11-01
  Administered 2014-10-30 – 2014-10-31 (×2): 50 mg via ORAL
  Filled 2014-10-30 (×3): qty 1

## 2014-10-30 MED ORDER — ATORVASTATIN CALCIUM 80 MG PO TABS
80.0000 mg | ORAL_TABLET | Freq: Every day | ORAL | Status: DC
  Administered 2014-10-30 – 2014-11-04 (×6): 80 mg via ORAL
  Filled 2014-10-30 (×6): qty 1

## 2014-10-30 MED ORDER — ACETAMINOPHEN 650 MG RE SUPP: 650.0000 mg | Freq: Four times a day (QID) | RECTAL | Status: DC | PRN

## 2014-10-30 MED ORDER — FOLIC ACID 1 MG PO TABS
1.0000 mg | ORAL_TABLET | Freq: Every day | ORAL | Status: DC
  Administered 2014-10-30 – 2014-11-04 (×6): 1 mg via ORAL
  Filled 2014-10-30 (×6): qty 1

## 2014-10-30 MED ORDER — ADULT MULTIVITAMIN W/MINERALS CH
1.0000 | ORAL_TABLET | Freq: Every day | ORAL | Status: DC
  Administered 2014-10-31 – 2014-11-04 (×5): 1 via ORAL
  Filled 2014-10-30 (×5): qty 1

## 2014-10-30 MED ORDER — VANCOMYCIN HCL IN DEXTROSE 1-5 GM/200ML-% IV SOLN: 1000.0000 mg | Freq: Two times a day (BID) | INTRAVENOUS | Status: DC

## 2014-10-30 MED ORDER — VENLAFAXINE HCL ER 150 MG PO CP24
150.0000 mg | ORAL_CAPSULE | Freq: Every day | ORAL | Status: DC
  Administered 2014-10-30 – 2014-11-03 (×5): 150 mg via ORAL
  Filled 2014-10-30 (×6): qty 1

## 2014-10-30 MED ORDER — METFORMIN HCL 500 MG PO TABS
500.0000 mg | ORAL_TABLET | Freq: Every day | ORAL | Status: DC
  Filled 2014-10-30 (×2): qty 1

## 2014-10-30 MED ORDER — BUTALBITAL-APAP-CAFFEINE 50-325-40 MG PO TABS: 1.0000 | ORAL_TABLET | Freq: Two times a day (BID) | ORAL | Status: DC | PRN

## 2014-10-30 MED ORDER — SODIUM CHLORIDE 0.9 % IJ SOLN: 3.0000 mL | INTRAMUSCULAR | Status: DC | PRN

## 2014-10-30 MED ORDER — SODIUM CHLORIDE 0.9 % IV SOLN: 250.0000 mL | INTRAVENOUS | Status: DC | PRN

## 2014-10-30 MED ORDER — PERPHENAZINE 2 MG PO TABS
2.0000 mg | ORAL_TABLET | Freq: Every day | ORAL | Status: DC
Start: 2014-10-30 — End: 2014-11-04
  Administered 2014-10-30 – 2014-11-04 (×6): 2 mg via ORAL
  Filled 2014-10-30 (×6): qty 1

## 2014-10-30 MED ORDER — VENLAFAXINE HCL ER 75 MG PO CP24
75.0000 mg | ORAL_CAPSULE | Freq: Two times a day (BID) | ORAL | Status: DC
  Filled 2014-10-30: qty 2

## 2014-10-30 MED ORDER — SODIUM CHLORIDE 0.9 % IJ SOLN
3.0000 mL | Freq: Two times a day (BID) | INTRAMUSCULAR | Status: DC
  Administered 2014-11-02 – 2014-11-03 (×2): 3 mL via INTRAVENOUS

## 2014-10-30 MED ORDER — PANTOPRAZOLE SODIUM 40 MG PO TBEC
40.0000 mg | DELAYED_RELEASE_TABLET | Freq: Every day | ORAL | Status: DC
  Administered 2014-10-31 – 2014-11-04 (×5): 40 mg via ORAL
  Filled 2014-10-30 (×6): qty 1

## 2014-10-30 MED ORDER — PIOGLITAZONE HCL 15 MG PO TABS
15.0000 mg | ORAL_TABLET | Freq: Every day | ORAL | Status: DC
  Filled 2014-10-30: qty 1

## 2014-10-30 NOTE — Progress Notes (Signed)
*  Preliminary Results* Right lower extremity venous duplex completed. Right lower extremity is negative for deep vein thrombosis. There is no evidence of right Baker's cyst.  Incidental finding: There is an enlarged right inguinal lymph node.  Preliminary results discussed with Dr. Christy Gentles.  10/30/2014 6:51 PM  Maudry Mayhew, RVT, RDCS, RDMS

## 2014-10-30 NOTE — ED Notes (Signed)
US at bedside

## 2014-10-30 NOTE — ED Notes (Signed)
Pt c/o increasing R leg pain and swelling x 2 days.  Pain score 6/10.  Pt sent by PCP after being diagnosed w/ cellulitis.  Per paperwork, Pt given 1G Rocephin IM in office.  Hx of venous insufficiency.

## 2014-10-30 NOTE — ED Provider Notes (Signed)
Patient seen/examined in the Emergency Department in conjunction with Midlevel Provider [facility] Patient reports right LE pain/redness Exam : awake/alert, erythema/warmth to right LE Plan: will admit for IV antibiotics    Ripley Fraise, MD 10/30/14 1914

## 2014-10-30 NOTE — ED Provider Notes (Signed)
CSN: [id number]     Arrival date & time 10/30/14  60 History   First MD Initiated Contact with Patient 10/30/14 1652     Chief Complaint  Patient presents with  . Leg Pain  . Leg Swelling     (Consider location/radiation/quality/duration/timing/severity/associated sxs/prior Treatment) Patient is a 64 y.o. male presenting with leg pain. The history is provided by the patient and medical records.  Leg Pain   64 year old male with past medical history significant for anxiety, arthritis, COPD, diabetes, GERD, hypertension, hyperlipidemia, chronic venous insufficiency status post vein stripping,  Presenting to the ED for right leg pain and swelling for the past 2 days. Patient states right lower leg has become increasingly more swollen and erythematous with warmth to touch. He states he has some pain, worse with ambulation but it is not unbearable. He states he has a history of cellulitis in this leg in the past. He was seen at PCP office earlier today when he was there for lab work and sent to the ED for further management with concern for recurrent cellulitis.  He was given 1g IM Rocephin prior to being sent here.   He denies any fever or chills. Patient's prior venous surgeries were done by Dr. Kellie Simmering.  He denies hx of DVT.  No chest pain or SOB.  Past Medical History  Diagnosis Date  . Anxiety   . Arthritis   . COPD (chronic obstructive pulmonary disease)   . Depression   . Diabetes mellitus without complication   . GERD (gastroesophageal reflux disease)   . Hypertension   . Migraine   . Hyperlipidemia   . Obesity   . Sleep apnea     CPAP   . Varicose veins    Past Surgical History  Procedure Laterality Date  . Knee arthroscopy      Dr. Noemi Chapel  . Ankle surgery Right     Dr. Marcelino Scot, has pins  . Tonsillectomy and adenoidectomy      age 45  . Right foot surgery,rt great toe callus removal     Family History  Problem Relation Age of Onset  . Hypertension Mother   . Stroke  Father     x 3  . Diabetes      mat great aunts  . Colon cancer Neg Hx   . Rectal cancer Neg Hx   . Alzheimer's disease Mother   . Heart attack Father   . Congestive Heart Failure Father   . Esophageal cancer Neg Hx    History  Substance Use Topics  . Smoking status: Former Smoker -- 2.00 packs/day for 20 years    Types: Cigarettes    Quit date: 06/20/1988  . Smokeless tobacco: Never Used  . Alcohol Use: No    Review of Systems  Cardiovascular: Positive for leg swelling.  Skin: Positive for color change.  All other systems reviewed and are negative.     Allergies  Altace; Codeine; and Naproxen  Home Medications   Prior to Admission medications   Medication Sig Start Date End Date Taking? Authorizing Provider  aliskiren (TEKTURNA) 300 MG tablet Take 300 mg by mouth daily.    Historical Provider, MD  aspirin 81 MG tablet Take 81 mg by mouth daily.    Historical Provider, MD  atenolol (TENORMIN) 50 MG tablet Take 50 mg by mouth daily.    Historical Provider, MD  atorvastatin (LIPITOR) 80 MG tablet Take 80 mg by mouth daily.    Historical Provider, MD  butalbital-acetaminophen-caffeine (FIORICET, ESGIC) 50-325-40 MG per tablet Take 1 tablet by mouth 2 (two) times daily as needed for pain or headache.    Historical Provider, MD  furosemide (LASIX) 40 MG tablet Take 40 mg by mouth as needed.     Historical Provider, MD  LORazepam (ATIVAN) 1 MG tablet Take 1 mg by mouth every 8 (eight) hours as needed for anxiety.    Historical Provider, MD  metFORMIN (GLUCOPHAGE) 500 MG tablet Take 500 mg by mouth daily.    Historical Provider, MD  methocarbamol (ROBAXIN) 500 MG tablet Take 500 mg by mouth 4 (four) times daily as needed (for muscle spasm.).    Historical Provider, MD  Multiple Vitamin (MULTIVITAMIN WITH MINERALS) TABS Take 1 tablet by mouth daily.    Historical Provider, MD  omeprazole (PRILOSEC) 20 MG capsule Take 20 mg by mouth daily.    Historical Provider, MD  perphenazine  (TRILAFON) 2 MG tablet Take 2 mg by mouth daily.    Historical Provider, MD  pioglitazone (ACTOS) 45 MG tablet Take 15 mg by mouth daily. Take 1/2 tablet daily    Historical Provider, MD  promethazine (PHENERGAN) 25 MG tablet Take 25 mg by mouth every 4 (four) hours as needed for nausea.    Historical Provider, MD  venlafaxine XR (EFFEXOR-XR) 75 MG 24 hr capsule Take 75-150 mg by mouth 2 (two) times daily. 1 cap in am, 2 caps in pm.    Historical Provider, MD   BP 145/66 mmHg  Pulse 63  Temp(Src) 98.3 F (36.8 C) (Oral)  Resp 16  SpO2 96%   Physical Exam  Constitutional: He is oriented to person, place, and time. He appears well-developed and well-nourished. No distress.  HENT:  Head: Normocephalic and atraumatic.  Mouth/Throat: Oropharynx is clear and moist.  Eyes: Conjunctivae and EOM are normal. Pupils are equal, round, and reactive to light.  Neck: Normal range of motion.  Cardiovascular: Normal rate, regular rhythm and normal heart sounds.   Pulmonary/Chest: Effort normal and breath sounds normal. No respiratory distress. He has no wheezes.  Abdominal: Soft. Bowel sounds are normal. There is no tenderness. There is no guarding.  Musculoskeletal: Normal range of motion.  Chronic venous insufficiency of BLE Right lower leg from knee to foot is significant swollen when compared with left; overlying skin is increasingly erythematous and warm to touch; some tenderness noted along posterior compartment without palpable cord; DP pulse intact; normal gait  Neurological: He is alert and oriented to person, place, and time.  Skin: Skin is warm. He is not diaphoretic.  Psychiatric: He has a normal mood and affect.  Nursing note and vitals reviewed.   ED Course  Procedures (including critical care time) Labs Review Labs Reviewed  CBC WITH DIFFERENTIAL/PLATELET - Abnormal; Notable for the following:    Hemoglobin 12.6 (*)    MCV 75.6 (*)    MCH 24.0 (*)    RDW 26.5 (*)    All other  components within normal limits  BASIC METABOLIC PANEL - Abnormal; Notable for the following:    Sodium 134 (*)    Calcium 8.4 (*)    All other components within normal limits    Imaging Review No results found.  *Preliminary Results* Right lower extremity venous duplex completed. Right lower extremity is negative for deep vein thrombosis. There is no evidence of right Baker's cyst.  Incidental finding: There is an enlarged right inguinal lymph node.  Preliminary results discussed with Dr. Christy Gentles.  10/30/2014 6:51 PM  Michelle Simonetti, RVT, RDCS, RDMS          EKG Interpretation None      MDM   Final diagnoses:  Cellulitis of right lower extremity  Chronic venous insufficiency    64 year old male sent here from PCP office for cellulitis. States has been developing over the past 2 days. Patient is afebrile and nontoxic in appearance. He has chronic venous insufficiency of bilateral legs. Right leg from knee to foot is noticeably swollen, erythematous, and warmth to touch when compared with left. There is mild tenderness of posterior right calf, however no palpable cord. DP pulse is intact.  Labwork is reassuring, no leukocytosis. Patient does have history of vein stripping and right leg, therefore DVT study was ordered which is negative. Patient started on vancomycin for cellulitis. He will be admitted to medicine service for further management.  Larene Pickett, PA-C 10/30/14 1930  Ripley Fraise, MD 10/31/14 [phone]

## 2014-10-30 NOTE — H&P (Signed)
[facility] History and Physical  JACEN CARLINI LNL:[id number] DOB: Mar 27, 1951 DOA: 10/30/2014  Referring physician: Quincy Carnes, PA PCP: Sheela Stack, MD   Chief Complaint: Right leg cellulitis  HPI: Jordan [name] is a 64 y.o. male with past history of diabetes mellitus with perpiheral vascular disease anxiety COPD GERD presents with swelling of leg and leg pain. Patient states hat 2 days ago he was at baseline. He states that he then started to note worsening of redness and pain. Overnight he states that the condition deteriorated even more. He has had pain in the leg. He states that he has had no fever noted. He staets that he had recently had his legs seen by his PCP a week ago. This morning he went again to his PCP for labs and had his legs looked at. He was then sent to the ED for further evaluation. He has had prior cellulitis about 2 years and was admitted. His last A1C was 4.9. In the ED he had dopplers done which do not show DVT at this time   Review of Systems:  Constitutional:  +weight loss about 80 pounds intentionally, no night sweats, Fevers, chills, fatigue.  HEENT:  No headaches, itching, ear ache, nasal congestion, post nasal drip,  Cardio-vascular:  No chest pain, Orthopnea, PND, ++swelling in lower extremities GI:  No heartburn, indigestion, abdominal pain, nausea, vomiting, +diarrhea  Resp:  No shortness of breath with exertion or at rest. no coughing up of blood.No change in color of mucus Skin:  ++LE swelling and induration with rash noted GU:  no dysuria, change in color of urine.  Musculoskeletal:  No joint pain or swelling. No back pain.  Psych:  No change in mood or affect.   Past Medical History  Diagnosis Date  . Anxiety   . Arthritis   . COPD (chronic obstructive pulmonary disease)   . Depression   . Diabetes mellitus without complication   . GERD (gastroesophageal reflux disease)   . Hypertension   . Migraine   .  Hyperlipidemia   . Obesity   . Sleep apnea     CPAP   . Varicose veins    Past Surgical History  Procedure Laterality Date  . Knee arthroscopy      Dr. Noemi Chapel  . Ankle surgery Right     Dr. Marcelino Scot, has pins  . Tonsillectomy and adenoidectomy      age 65  . Right foot surgery,rt great toe callus removal     Social History:  reports that he quit smoking about 26 years ago. His smoking use included Cigarettes. He has a 40 pack-year smoking history. He has never used smokeless tobacco. He reports that he does not drink alcohol or use illicit drugs.  Allergies  Allergen Reactions  . Altace [Ramipril]     Dizziness, migraine, weakness  . Codeine Itching  . Naproxen Itching    Family History  Problem Relation Age of Onset  . Hypertension Mother   . Stroke Father     x 3  . Diabetes      mat great aunts  . Colon cancer Neg Hx   . Rectal cancer Neg Hx   . Alzheimer's disease Mother   . Heart attack Father   . Congestive Heart Failure Father   . Esophageal cancer Neg Hx      Prior to Admission medications   Medication Sig Start Date End Date Taking? Authorizing Provider  aliskiren (TEKTURNA) 300 MG tablet Take 300  mg by mouth daily.    Historical Provider, MD  aspirin 81 MG tablet Take 81 mg by mouth daily.    Historical Provider, MD  atenolol (TENORMIN) 50 MG tablet Take 50 mg by mouth daily.    Historical Provider, MD  atorvastatin (LIPITOR) 80 MG tablet Take 80 mg by mouth daily.    Historical Provider, MD  butalbital-acetaminophen-caffeine (FIORICET, ESGIC) 50-325-40 MG per tablet Take 1 tablet by mouth 2 (two) times daily as needed for pain or headache.    Historical Provider, MD  furosemide (LASIX) 40 MG tablet Take 40 mg by mouth as needed.     Historical Provider, MD  LORazepam (ATIVAN) 1 MG tablet Take 1 mg by mouth every 8 (eight) hours as needed for anxiety.    Historical Provider, MD  metFORMIN (GLUCOPHAGE) 500 MG tablet Take 500 mg by mouth daily.    Historical  Provider, MD  methocarbamol (ROBAXIN) 500 MG tablet Take 500 mg by mouth 4 (four) times daily as needed (for muscle spasm.).    Historical Provider, MD  Multiple Vitamin (MULTIVITAMIN WITH MINERALS) TABS Take 1 tablet by mouth daily.    Historical Provider, MD  omeprazole (PRILOSEC) 20 MG capsule Take 20 mg by mouth daily.    Historical Provider, MD  perphenazine (TRILAFON) 2 MG tablet Take 2 mg by mouth daily.    Historical Provider, MD  pioglitazone (ACTOS) 45 MG tablet Take 15 mg by mouth daily. Take 1/2 tablet daily    Historical Provider, MD  promethazine (PHENERGAN) 25 MG tablet Take 25 mg by mouth every 4 (four) hours as needed for nausea.    Historical Provider, MD  venlafaxine XR (EFFEXOR-XR) 75 MG 24 hr capsule Take 75-150 mg by mouth 2 (two) times daily. 1 cap in am, 2 caps in pm.    Historical Provider, MD   Physical Exam: Filed Vitals:   10/30/14 1644 10/30/14 1857  BP: 145/66 149/55  Pulse: 63 53  Temp: 98.3 F (36.8 C)   TempSrc: Oral   Resp: 16 18  SpO2: 96% 100%    Wt Readings from Last 3 Encounters:  10/07/14 111.131 kg (245 lb)  09/30/14 113.399 kg (250 lb)  07/14/14 117.935 kg (260 lb)    General:  Appears calm and comfortable Eyes: PERRL, normal lids, irises & conjunctiva ENT: grossly normal hearing, lips & tongue Neck: no LAD, masses or thyromegaly Cardiovascular: RRR, no m/r/g. ++bilateral LE edema Respiratory: CTA bilaterally, no w/r/r. Normal respiratory effort. Abdomen: soft, ntnd Skin: bilateral LE induration rash noted with edema Musculoskeletal: grossly normal tone BUE/BLE Psychiatric: grossly normal mood and affect Neurologic: grossly non-focal.          Labs on Admission:  Basic Metabolic Panel:  Recent Labs Lab 10/30/14 1717  NA 134*  K 3.6  CL 104  CO2 25  GLUCOSE 93  BUN 9  CREATININE 0.79  CALCIUM 8.4*   Liver Function Tests: No results for input(s): AST, ALT, ALKPHOS, BILITOT, PROT, ALBUMIN in the last 168 hours. No results  for input(s): LIPASE, AMYLASE in the last 168 hours. No results for input(s): AMMONIA in the last 168 hours. CBC:  Recent Labs Lab 10/30/14 1717  WBC 7.2  NEUTROABS 4.7  HGB 12.6*  HCT 39.6  MCV 75.6*  PLT 222   Cardiac Enzymes: No results for input(s): CKTOTAL, CKMB, CKMBINDEX, TROPONINI in the last 168 hours.  BNP (last 3 results) No results for input(s): BNP in the last 8760 hours.  ProBNP (last 3  results) No results for input(s): PROBNP in the last 8760 hours.  CBG: No results for input(s): GLUCAP in the last 168 hours.  Radiological Exams on Admission: No results found.    Assessment/Plan Active Problems:   Essential hypertension, benign   Diabetes   PAD (peripheral artery disease)   Chronic venous insufficiency   Cellulitis of right leg   1. Cellulitis of the Right leg -will admit for IV antibiotics -started on vancocin and aztreonam -will get blood cultures now -consider vascular evaluation  2. Essential Hypertension -monitor pressures -will continue with antihypertensives  3. Diabetes mellitus with PAD -will check A1C -will check FSBS and cover with sliding scale as needed -continue with home medications  4. Chronic Venous insufficiency -doppler done in the ED and was negative  5. Microcytic Anemia -will get iron studies B12 and folate  Code Status: Full Code (must indicate code status--if unknown or must be presumed, indicate so) DVT Prophylaxis:heparin Family Communication: None (indicate person spoken with, if applicable, with phone number if by telephone) Disposition Plan: home (indicate anticipated LOS)  Time spent: 7min  Erian Rosengren A [facility] Pager [phone]

## 2014-10-30 NOTE — ED Notes (Signed)
Called unit to give report. Nurse said she would call back.

## 2014-10-30 NOTE — Progress Notes (Addendum)
ANTIBIOTIC CONSULT NOTE - INITIAL  Pharmacy Consult for Vancomycin, Azactam Indication: cellulitis, DM foot infection  Allergies  Allergen Reactions  . Altace [Ramipril]     Dizziness, migraine, weakness  . Codeine Itching  . Naproxen Itching    Patient Measurements:   Weight 111 kg as of 10/07/14  Vital Signs: Temp: 98.3 F (36.8 C) (05/12 1644) Temp Source: Oral (05/12 1644) BP: 145/66 mmHg (05/12 1644) Pulse Rate: 63 (05/12 1644) Intake/Output from previous day:   Intake/Output from this shift:    Labs:  Recent Labs  10/30/14 1717  WBC 7.2  HGB 12.6*  PLT 222  CREATININE 0.79   CrCl cannot be calculated (Unknown ideal weight.). No results for input(s): VANCOTROUGH, VANCOPEAK, VANCORANDOM, GENTTROUGH, GENTPEAK, GENTRANDOM, TOBRATROUGH, TOBRAPEAK, TOBRARND, AMIKACINPEAK, AMIKACINTROU, AMIKACIN in the last 72 hours.   Microbiology: No results found for this or any previous visit (from the past 720 hour(s)).  Medical History: Past Medical History  Diagnosis Date  . Anxiety   . Arthritis   . COPD (chronic obstructive pulmonary disease)   . Depression   . Diabetes mellitus without complication   . GERD (gastroesophageal reflux disease)   . Hypertension   . Migraine   . Hyperlipidemia   . Obesity   . Sleep apnea     CPAP   . Varicose veins      Assessment: 61 yoM with history of chronic venous insufficiency presents with right leg pain and swelling.  Sent by PCP with concern for recurrent cellulitis.  Pharmacy consulted to start vancomycin.  Patient is afebrile, renal function WNL, CrCl~95 ml/min (normalized).  Goal of Therapy:  Vancomycin trough level 10-15 mcg/ml  Plan:  Vancomycin 2500 mg LD then 1g q12h. F/u SCr, trough levels as indicated.  Hershal Coria 10/30/2014,6:27 PM   ADDENDUM: MD suspecting diabetic foot infection.  Pharmacy requested to add aztreonam.  Plan: Start Aztreonam 2g IV q8h.  Hershal Coria, PharmD, BCPS Pager:  [phone] 10/30/2014 9:05 PM

## 2014-10-31 LAB — COMPREHENSIVE METABOLIC PANEL
ALT: 19 U/L (ref 17–63)
ANION GAP: 9 (ref 5–15)
AST: 20 U/L (ref 15–41)
Albumin: 3 g/dL — ABNORMAL LOW (ref 3.5–5.0)
Alkaline Phosphatase: 103 U/L (ref 38–126)
BILIRUBIN TOTAL: 0.8 mg/dL (ref 0.3–1.2)
BUN: 10 mg/dL (ref 6–20)
CALCIUM: 8.5 mg/dL — AB (ref 8.9–10.3)
CO2: 29 mmol/L (ref 22–32)
Chloride: 103 mmol/L (ref 101–111)
Creatinine, Ser: 0.89 mg/dL (ref 0.61–1.24)
GFR calc Af Amer: >60 mL/min (ref >60)
Glucose, Bld: 88 mg/dL (ref 65–99)
Potassium: 3.6 mmol/L (ref 3.5–5.1)
Sodium: 141 mmol/L (ref 135–145)
TOTAL PROTEIN: 6.4 g/dL — AB (ref 6.5–8.1)

## 2014-10-31 LAB — CBC
HEMATOCRIT: 39.7 % (ref 39.0–52.0)
Hemoglobin: 12.4 g/dL — ABNORMAL LOW (ref 13.0–17.0)
MCH: 24 pg — ABNORMAL LOW (ref 26.0–34.0)
MCHC: 31.2 g/dL (ref 30.0–36.0)
MCV: 76.8 fL — AB (ref 78.0–100.0)
Platelets: 202 10*3/uL (ref 150–400)
RBC: 5.17 MIL/uL (ref 4.22–5.81)
RDW: 26.8 % — ABNORMAL HIGH (ref 11.5–15.5)
WBC: 9.7 10*3/uL (ref 4.0–10.5)

## 2014-10-31 LAB — GLUCOSE, CAPILLARY
GLUCOSE-CAPILLARY: 93 mg/dL (ref 65–99)
GLUCOSE-CAPILLARY: 114 mg/dL — AB (ref 65–99)
Glucose-Capillary: 99 mg/dL (ref 65–99)
Glucose-Capillary: 84 mg/dL (ref 65–99)

## 2014-10-31 LAB — HIV ANTIBODY (ROUTINE TESTING W REFLEX): HIV Screen 4th Generation wRfx: NONREACTIVE

## 2014-10-31 LAB — IRON AND TIBC
IRON: 40 ug/dL — AB (ref 45–182)
Saturation Ratios: 18 % (ref 17.9–39.5)
TIBC: 220 ug/dL — ABNORMAL LOW (ref 250–450)
UIBC: 180 ug/dL

## 2014-10-31 LAB — C-REACTIVE PROTEIN: CRP: 6.5 mg/dL — AB (ref <1.0)

## 2014-10-31 LAB — SEDIMENTATION RATE: SED RATE: 28 mm/h — AB (ref 0–16)

## 2014-10-31 LAB — PREALBUMIN: Prealbumin: 9.4 mg/dL — ABNORMAL LOW (ref 18–38)

## 2014-10-31 LAB — VITAMIN B12: Vitamin B-12: 595 pg/mL (ref 180–914)

## 2014-10-31 LAB — FERRITIN: Ferritin: 85 ng/mL (ref 24–336)

## 2014-10-31 MED ORDER — PIPERACILLIN-TAZOBACTAM 3.375 G IVPB
3.3750 g | Freq: Three times a day (TID) | INTRAVENOUS | Status: DC
  Administered 2014-10-31 – 2014-11-04 (×13): 3.375 g via INTRAVENOUS
  Filled 2014-10-31 (×14): qty 50

## 2014-10-31 MED ORDER — PRO-STAT SUGAR FREE PO LIQD
30.0000 mL | Freq: Two times a day (BID) | ORAL | Status: DC
  Administered 2014-10-31 (×2): 30 mL via ORAL
  Filled 2014-10-31 (×10): qty 30

## 2014-10-31 MED ORDER — FUROSEMIDE 40 MG PO TABS
40.0000 mg | ORAL_TABLET | Freq: Every day | ORAL | Status: AC
  Administered 2014-10-31 – 2014-11-02 (×3): 40 mg via ORAL
  Filled 2014-10-31 (×3): qty 1

## 2014-10-31 MED ORDER — INSULIN ASPART 100 UNIT/ML ~~LOC~~ SOLN
0.0000 [IU] | Freq: Three times a day (TID) | SUBCUTANEOUS | Status: DC
  Administered 2014-11-01: 1 [IU] via SUBCUTANEOUS

## 2014-10-31 MED ORDER — INSULIN ASPART 100 UNIT/ML ~~LOC~~ SOLN: 0.0000 [IU] | Freq: Every day | SUBCUTANEOUS | Status: DC

## 2014-10-31 MED ORDER — VANCOMYCIN HCL IN DEXTROSE 1-5 GM/200ML-% IV SOLN
1000.0000 mg | Freq: Two times a day (BID) | INTRAVENOUS | Status: DC
  Administered 2014-10-31 – 2014-11-04 (×9): 1000 mg via INTRAVENOUS
  Filled 2014-10-31 (×10): qty 200

## 2014-10-31 NOTE — Progress Notes (Signed)
Initial Nutrition Assessment  DOCUMENTATION CODES:  Obesity unspecified  INTERVENTION:  Prostat BID  NUTRITION DIAGNOSIS:  Increased nutrient needs related to wound healing as evidenced by estimated needs.  GOAL:  Patient will meet greater than or equal to 90% of their needs  MONITOR:  Labs, Supplement acceptance, Weight trends, I & O's  REASON FOR ASSESSMENT:  Consult Wound healing  ASSESSMENT: Pt is a 64 y.o. male with past history of diabetes mellitus with perpiheral vascular disease anxiety COPD GERD presents with swelling of leg and leg pain. He has had prior cellulitis about 2 years ago. His last A1C was 4.9.  Received consult for wounds. Pt admits to intentionally loosing about 80 Lb in last couple years to better control his diabetes. Per wt history pt lost 14 Lb in one month (6%, significant for time frame). Diet recall revealed that pt is usually consuming 2 meals per day.  Discussed with pt the importance of consuming multiple small meals and snacks throughout the day for better glucose control. Pt was interested in having a handout on carbohydrate counting to take home. Provided pt with "Carbohydrate Counting For People with Diabetes" by the Academy of [employer] Dietetics. Used MyPlate method to emphasize the healthy eating pattern. Encouraged pt to order protein with every meal. Teach back method used, pt verbalized understanding, expect good compliance.  Pt is agreable to trying Prostat while in the hospital to better meet his protein needs for wound healing, will order BID. Pt grateful for the information and the handout.  Labs reviewed: Ca 8.5, Alb 3.0, Glu 84 - 99  Height:  Ht Readings from Last 1 Encounters:  10/30/14 6\' 1"  (1.854 m)    Weight:  Wt Readings from Last 1 Encounters:  10/30/14 231 lb 0.7 oz (104.8 kg)    Ideal Body Weight:  83.6 kg  Wt Readings from Last 10 Encounters:  10/30/14 231 lb 0.7 oz (104.8 kg)  10/07/14 245 lb (111.131  kg)  09/30/14 250 lb (113.399 kg)  07/14/14 260 lb (117.935 kg)  06/30/14 265 lb (120.203 kg)  05/05/14 270 lb (122.471 kg)  04/28/14 265 lb (120.203 kg)  02/13/14 277 lb (125.646 kg)  01/21/14 275 lb (124.739 kg)  12/31/13 277 lb 3.2 oz (125.737 kg)    BMI:  Body mass index is 30.49 kg/(m^2).  Estimated Nutritional Needs:  Kcal:  2100 - 2300  Protein:  110 - 130 g  Fluid:  per MD  Skin:  Reviewed, no issues (edema) Cellulitis  Diet Order:  Diet Carb Modified Fluid consistency:: Thin; Room service appropriate?: Yes  EDUCATION NEEDS:  Education needs addressed   Intake/Output Summary (Last 24 hours) at 10/31/14 1254 Last data filed at 10/31/14 0830  Gross per 24 hour  Intake    240 ml  Output      0 ml  Net    240 ml    Last BM:  PTA  Avory Mimbs A. [place] Dietetic Intern Pager: [phone] 10/31/2014 12:54 PM

## 2014-10-31 NOTE — Plan of Care (Cosign Needed)
Problem: Food- and Nutrition-Related Knowledge Deficit (NB-1.1) Goal: Nutrition education Formal process to instruct or train a patient/client in a skill or to impart knowledge to help patients/clients voluntarily manage or modify food choices and eating behavior to maintain or improve health.  Outcome: Adequate for Discharge Received consult for wound education.  Diet recall revealed pt consuming various sources of protein throughout the day. Pt revealed that he intentionally lost about 81 Lb in last several years to help control his diabetes. Pt admitted to eating two large meals a day. Discussed importance of eating several smaller meals and snacks to help better control his blood sugars. His last HbA1c was 4.9. Pt was interested in having a handout for carbohydrate counting. Provided pt with "Carbohydrate Counting For People with Diabetes" by the Academy of [employer] Dietetics. Used MyPlate method to emphasize the healthy eating pattern. Encouraged pt to order protein with every meal. Teach back method used, pt verbalized understanding, expect good compliance. Pt is agreable to trying Prostat while in the hospital to better meet his protein needs for wound healing, will order BID. Pt grateful for the information and the handout.   Jennfier Abdulla A. [facility] Dietetic Intern Pager: [phone] 10/31/2014 9:15 AM

## 2014-10-31 NOTE — Progress Notes (Signed)
[facility]                                                                              Patient Demographics  Jordan [name], is a 64 y.o. male, DOB - 1950/09/15, ZES:[id number]  Admit date - 10/30/2014   Admitting Physician Allyne Gee, MD  Outpatient Primary MD for the patient is Sheela Stack, MD  LOS - 1   Chief Complaint  Patient presents with  . Leg Pain  . Leg Swelling       Brief HPI   Patient is a 64 year old male with diabetes, referral vascular disease, anxiety, COPD, GERD presented with right leg pain and swelling, worsening in the last 2 days. Patient reported that 2 days ago he was at his baseline, started to note worsening of the redness. Overnight, deteriorated more.  Patient was admitted for cellulitis. In the ED, Dopplers were done which did not show any DVT.    Assessment & Plan    Principal Problem:   Cellulitis of right leg; no DVT - Started on IV vancomycin and zosyn (for broad-spectrum coverage due to diabetes, chronic venous insufficiency),  feels improving - Recommended patient to keep leg elevated while in bed, also started on oral Lasix - Patient would benefit from lymphedema clinic either in [facility] or [facility], will need referral from his PCP, discussed with the patient, he will talk to Dr. Forde Dandy   Active Problems:   Essential hypertension, benign - Currently stable, continue atenolol    Diabetes mellitus, overnight had hypoglycemia With blood sugars of 38 and 54 - Discontinue metformin and Actos, obtain hemoglobin A1c, continue sensitive  sliding scale insulin Only    PAD (peripheral artery disease) - Continue aspirin  Hyperlipidemia  - Continue statin   Code Status: Full code  Family Communication: Discussed in detail with the patient, all imaging results, lab results explained to the patient *   Disposition Plan: Home once cellulitis has improved  Time Spent in minutes   25  minutes  Procedures   Doppler ultrasound of the lower extremities   Consults   none  DVT Prophylaxis  heparin  Medications  Scheduled Meds: . aliskiren  300 mg Oral Daily  . aspirin EC  81 mg Oral Daily  . atenolol  50 mg Oral Daily  . atorvastatin  80 mg Oral Daily  . aztreonam  2 g Intravenous 3 times per day  . docusate sodium  100 mg Oral BID  . folic acid  1 mg Oral Daily  . heparin  5,000 Units Subcutaneous 3 times per day  . insulin aspart  0-15 Units Subcutaneous TID WC  . metFORMIN  500 mg Oral Q breakfast  . multivitamin with minerals  1 tablet Oral Daily  . pantoprazole  40 mg Oral Daily  . perphenazine  2 mg Oral Daily  . pioglitazone  15 mg Oral Daily  . pneumococcal 23 valent vaccine  0.5 mL Intramuscular Tomorrow-1000  . sodium chloride  3 mL Intravenous Q12H  . thiamine  100 mg Oral Daily  . venlafaxine XR  150 mg Oral QPC supper  Continuous Infusions:  PRN Meds:.sodium chloride, acetaminophen **OR** acetaminophen, alum & mag hydroxide-simeth, butalbital-acetaminophen-caffeine, furosemide, LORazepam, methocarbamol, oxyCODONE, polyethylene glycol, promethazine, sodium chloride   Antibiotics   Anti-infectives    Start     Dose/Rate Route Frequency Ordered Stop   10/31/14 0800  vancomycin (VANCOCIN) IVPB 1000 mg/200 mL premix  Status:  Discontinued     1,000 mg 200 mL/hr over 60 Minutes Intravenous Every 12 hours 10/30/14 1833 10/30/14 2043   10/30/14 2200  aztreonam (AZACTAM) 2 g in dextrose 5 % 50 mL IVPB     2 g 100 mL/hr over 30 Minutes Intravenous 3 times per day 10/30/14 2103     10/30/14 1845  vancomycin (VANCOCIN) 2,500 mg in sodium chloride 0.9 % 500 mL IVPB     2,500 mg 250 mL/hr over 120 Minutes Intravenous  Once 10/30/14 1833 10/30/14 2102        Subjective:   Sabas Frett was seen and examined today.  Patient denies dizziness, chest pain, shortness of breath, abdominal pain, N/V/D/C, new weakness, numbess, tingling. No acute events  overnight.  Right lower leg pain is improving, still significant swelling and redness, has chronic venous insufficiency  Objective:   Blood pressure 172/74, pulse 71, temperature 99.1 F (37.3 C), temperature source Oral, resp. rate 18, height 6\' 1"  (1.854 m), weight 104.8 kg (231 lb 0.7 oz), SpO2 99 %.  Wt Readings from Last 3 Encounters:  10/30/14 104.8 kg (231 lb 0.7 oz)  10/07/14 111.131 kg (245 lb)  09/30/14 113.399 kg (250 lb)    No intake or output data in the 24 hours ending 10/31/14 0753  Exam  General: Alert and oriented x 3, NAD  HEENT:  PERRLA, EOMI, Anicteic Sclera, mucous membranes moist.   Neck: Supple, no JVD, no masses  CVS: S1 S2 auscultated, no rubs, murmurs or gallops. Regular rate and rhythm.  Respiratory: Clear to auscultation bilaterally, no wheezing, rales or rhonchi  Abdomen: Soft, nontender, nondistended, + bowel sounds  Ext: no cyanosis clubbing, lymphedema with right leg warmth, erythema up to calves, chronic venous stasis changes bilaterally   Neuro: AAOx3, Cr N's II- XII. Strength 5/5 upper and lower extremities bilaterally  Skin: No rashes  Psych: Normal affect and demeanor, alert and oriented x3    Data Review   Micro Results No results found for this or any previous visit (from the past 240 hour(s)).  Radiology Reports No results found.  CBC  Recent Labs Lab 10/30/14 1717 10/30/14 2228 10/31/14 0555  WBC 7.2 6.2 9.7  HGB 12.6* 12.2* 12.4*  HCT 39.6 39.5 39.7  PLT 222 208 202  MCV 75.6* 77.0* 76.8*  MCH 24.0* 23.8* 24.0*  MCHC 31.8 30.9 31.2  RDW 26.5* 26.8* 26.8*  LYMPHSABS 1.5  --   --   MONOABS 0.8  --   --   EOSABS 0.2  --   --   BASOSABS 0.0  --   --     Chemistries   Recent Labs Lab 10/30/14 1717 10/30/14 2228 10/31/14 0555  NA 134*  --  141  K 3.6  --  3.6  CL 104  --  103  CO2 25  --  29  GLUCOSE 93  --  88  BUN 9  --  10  CREATININE 0.79 0.99 0.89  CALCIUM 8.4*  --  8.5*  AST  --   --  20  ALT   --   --  19  ALKPHOS  --   --  103  BILITOT  --   --  0.8   ------------------------------------------------------------------------------------------------------------------ estimated creatinine clearance is 106.6 mL/min (by C-G formula based on Cr of 0.89). ------------------------------------------------------------------------------------------------------------------ No results for input(s): HGBA1C in the last 72 hours. ------------------------------------------------------------------------------------------------------------------ No results for input(s): CHOL, HDL, LDLCALC, TRIG, CHOLHDL, LDLDIRECT in the last 72 hours. ------------------------------------------------------------------------------------------------------------------  Recent Labs  10/30/14 2228  TSH 1.035   ------------------------------------------------------------------------------------------------------------------  Recent Labs  10/30/14 2228  VITAMINB12 595  FERRITIN 85  TIBC 220*  IRON 40*    Coagulation profile No results for input(s): INR, PROTIME in the last 168 hours.  No results for input(s): DDIMER in the last 72 hours.  Cardiac Enzymes No results for input(s): CKMB, TROPONINI, MYOGLOBIN in the last 168 hours.  Invalid input(s): CK ------------------------------------------------------------------------------------------------------------------ Invalid input(s): [place]  10/30/14 2053 10/30/14 2142 10/30/14 2220  GLUCAP 54* 38* 120*     RAI,RIPUDEEP M.D. [facility] 10/31/2014, 7:53 AM  Pager: [phone]   Between 7am to 7pm - call Pager - [phone]  After 7pm go to [email or web address] - password [email or web address]  Call night coverage person covering after 7pm

## 2014-10-31 NOTE — Progress Notes (Addendum)
CSW is unable to assist with access to meds at d/c. RNCM has been consulted.  Werner Lean LCSW [phone]

## 2014-10-31 NOTE — Progress Notes (Signed)
Inpatient Diabetes Program Recommendations  AACE/ADA: New Consensus Statement on Inpatient Glycemic Control (2013)  Target Ranges:  Prepandial:   less than 140 mg/dL      Peak postprandial:   less than 180 mg/dL (1-2 hours)      Critically ill patients:  140 - 180 mg/dL   Reason for Assessment: Hypoglycemia  Diabetes history: DM2 Outpatient Diabetes medications: Actos 15 mg QD, metformin 500 mg QD Current orders for Inpatient glycemic control: Novolog sensitive tidwc and hs  64 year old male admitted with R leg cellulitis. Hx DM2 on OHAs at home. Hypoglycemia last night. Likely d/t poor po intake.  Results for SHIRAZ, BASTYR (MRN [id number]) as of 10/31/2014 12:51  Ref. Range 10/30/2014 20:53 10/30/2014 21:42 10/30/2014 22:20 10/31/2014 08:16 10/31/2014 12:12  Glucose-Capillary Latest Ref Range: 65-99 mg/dL 54 (L) 38 (LL) 120 (H) 99 84  Results for KRUE, PETERKA (MRN [id number]) as of 10/31/2014 12:51  Ref. Range 10/31/2014 05:55  Sodium Latest Ref Range: 135-145 mmol/L 141  Potassium Latest Ref Range: 3.5-5.1 mmol/L 3.6  Chloride Latest Ref Range: 101-111 mmol/L 103  CO2 Latest Ref Range: 22-32 mmol/L 29  BUN Latest Ref Range: 6-20 mg/dL 10  Creatinine Latest Ref Range: 0.61-1.24 mg/dL 0.89  Calcium Latest Ref Range: 8.9-10.3 mg/dL 8.5 (L)  EGFR (Non-African Amer.) Latest Ref Range: >60 mL/min >60  EGFR (African American) Latest Ref Range: >60 mL/min >60  Glucose Latest Ref Range: 65-99 mg/dL 88  Anion gap Latest Ref Range: 5-15  9   Agree with Novolog sensitive tidwc and hs. Tight glycemic control needed for healing.  Recommendations: Need HgbA1C to assess glycemic control prior to hospitalization.  Will follow. Thank you. Lorenda Peck, RD, LDN, CDE Inpatient Diabetes Coordinator [phone]

## 2014-10-31 NOTE — Progress Notes (Signed)
New admit @ 2045. FSBS 54 snack given recheck FSBS 38 patient asymptomatic. D50 given per protocol. Recheck FSBS 120.

## 2014-10-31 NOTE — Consult Note (Signed)
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  MD is aware and has been treating it conservatively with a bandage when needed and antibiotics as needed, per patient.  Patient has generalized nonpitting edema to bilateral lower legs.  MD note mention referral to a lymphedema clinic after discharge.  Right leg is currently erythematous and cellulitic. IV antibiotics currently. Lasix as well and patient states edema is some better,.  Wound type: Neuropathic ulcer to right great toe, present on admission.  Cellulitis right lower extremity Pressure Ulcer POA: Yes Measurement: Right great plantar toe:  0.5 cm x 0.5 cm x 0.2 cm Calloused periwound, scabbed wound bed.   Drainage (amount, consistency, odor) None at this time.  Periwound:Calloused Dressing procedure/placement/frequency:Cleanse right great toe with soap and water.  Dry dressing to open wound on plantar toe.  Change daily.  Will not follow at this time.  Please re-consult if needed.  Domenic Moras RN BSN [place] Pager [phone]

## 2014-10-31 NOTE — Progress Notes (Signed)
ANTIBIOTIC CONSULT NOTE - INITIAL  Pharmacy Consult for Vancomycin, Zosyn Indication: cellulitis, DM foot infection  Allergies  Allergen Reactions  . Altace [Ramipril]     Dizziness, migraine, weakness  . Codeine Itching  . Naproxen Itching    Patient Measurements: Height: 6\' 1"  (185.4 cm) Weight: 231 lb 0.7 oz (104.8 kg) IBW/kg (Calculated) : 79.9 Weight 111 kg as of 10/07/14  Vital Signs: Temp: 99.1 F (37.3 C) (05/13 0542) Temp Source: Oral (05/13 0542) BP: 172/74 mmHg (05/13 0542) Pulse Rate: 71 (05/13 0542) Intake/Output from previous day:   Intake/Output from this shift: Total I/O In: 240 [P.O.:240] Out: -   Labs:  Recent Labs  10/30/14 1717 10/30/14 2228 10/31/14 0555  WBC 7.2 6.2 9.7  HGB 12.6* 12.2* 12.4*  PLT 222 208 202  CREATININE 0.79 0.99 0.89   Estimated Creatinine Clearance: 106.6 mL/min (by C-G formula based on Cr of 0.89). No results for input(s): VANCOTROUGH, VANCOPEAK, VANCORANDOM, GENTTROUGH, GENTPEAK, GENTRANDOM, TOBRATROUGH, TOBRAPEAK, TOBRARND, AMIKACINPEAK, AMIKACINTROU, AMIKACIN in the last 72 hours.   Microbiology: No results found for this or any previous visit (from the past 720 hour(s)).  Medical History: Past Medical History  Diagnosis Date  . Anxiety   . Arthritis   . COPD (chronic obstructive pulmonary disease)   . Depression   . Diabetes mellitus without complication   . GERD (gastroesophageal reflux disease)   . Hypertension   . Migraine   . Hyperlipidemia   . Obesity   . Sleep apnea     CPAP   . Varicose veins      Assessment: 85 yoM with history of chronic venous insufficiency presents with right leg pain and swelling.  Sent by PCP with concern for recurrent cellulitis.  Pharmacy consulted to start vancomycin and aztreonam last night.  Aztreonam d/c'd and now pharmacy consulted to continue vanc and add zosyn.  Scr 0.89, CrCl ~85 (N)  Goal of Therapy:  Vancomycin trough level 10-15 mcg/ml  Eradication of  infection  Plan:  Continue vancomycin 1g q12h. Zosyn 3.375mg  IV q8h extended interval F/u SCr, trough levels as indicated.  Dolly Rias RPh 10/31/2014, 8:52 AM Pager [phone]   ADDENDUM: MD suspecting diabetic foot infection.  Pharmacy requested to add aztreonam.  Plan: Start Aztreonam 2g IV q8h.  Hershal Coria, PharmD, BCPS Pager: [phone] 10/31/2014 8:48 AM

## 2014-11-01 LAB — CBC
HCT: 37.2 % — ABNORMAL LOW (ref 39.0–52.0)
Hemoglobin: 11.4 g/dL — ABNORMAL LOW (ref 13.0–17.0)
MCH: 23.9 pg — AB (ref 26.0–34.0)
MCHC: 30.6 g/dL (ref 30.0–36.0)
MCV: 78.2 fL (ref 78.0–100.0)
Platelets: 168 10*3/uL (ref 150–400)
RBC: 4.76 MIL/uL (ref 4.22–5.81)
RDW: 26.9 % — ABNORMAL HIGH (ref 11.5–15.5)
WBC: 8.3 10*3/uL (ref 4.0–10.5)

## 2014-11-01 LAB — BASIC METABOLIC PANEL
Anion gap: 7 (ref 5–15)
BUN: 14 mg/dL (ref 6–20)
CO2: 31 mmol/L (ref 22–32)
CREATININE: 0.97 mg/dL (ref 0.61–1.24)
Calcium: 8.2 mg/dL — ABNORMAL LOW (ref 8.9–10.3)
Chloride: 104 mmol/L (ref 101–111)
GFR calc Af Amer: >60 mL/min (ref >60)
Glucose, Bld: 91 mg/dL (ref 65–99)
POTASSIUM: 3.9 mmol/L (ref 3.5–5.1)
SODIUM: 142 mmol/L (ref 135–145)

## 2014-11-01 LAB — GLUCOSE, CAPILLARY
GLUCOSE-CAPILLARY: 80 mg/dL (ref 65–99)
GLUCOSE-CAPILLARY: 169 mg/dL — AB (ref 65–99)
Glucose-Capillary: 121 mg/dL — ABNORMAL HIGH (ref 65–99)
Glucose-Capillary: 101 mg/dL — ABNORMAL HIGH (ref 65–99)

## 2014-11-01 LAB — HEMOGLOBIN A1C
Hgb A1c MFr Bld: 5 % (ref 4.8–5.6)
Mean Plasma Glucose: 97 mg/dL

## 2014-11-01 NOTE — Progress Notes (Signed)
[facility]                                                                              Patient Demographics  Jordan [name], is a 64 y.o. male, DOB - 1951/05/05, TOI:[id number]  Admit date - 10/30/2014   Admitting Physician Allyne Gee, MD  Outpatient Primary MD for the patient is Sheela Stack, MD  LOS - 2   Chief Complaint  Patient presents with  . Leg Pain  . Leg Swelling       Brief HPI   Patient is a 64 year old male with diabetes, referral vascular disease, anxiety, COPD, GERD presented with right leg pain and swelling, worsening in the last 2 days. Patient reported that 2 days ago he was at his baseline, started to note worsening of the redness. Overnight, deteriorated more.  Patient was admitted for cellulitis. In the ED, Dopplers were done which did not show any DVT.    Assessment & Plan    Principal Problem:   Cellulitis of right leg; no DVT - Started on IV vancomycin and zosyn (for broad-spectrum coverage due to diabetes, chronic venous insufficiency), cellulitis improving.  - Recommended patient to keep leg elevated while in bed, also started on oral Lasix - Patient would benefit from lymphedema clinic either in [facility] or [facility], will need referral from his PCP, discussed with the patient, he will talk to Dr. Forde Dandy   Active Problems:   Essential hypertension, benign - Currently stable, continue atenolol    Diabetes mellitus,- Discontinue metformin and Actos in view of his hypoglycemia. cbgs are better today. HGBA1C IS 5.     PAD (peripheral artery disease) - Continue aspirin  Hyperlipidemia  - Continue statin   Code Status: Full code  Family Communication: Discussed in detail with the patient, all imaging results, lab results explained to the patient    Disposition Plan: Home once cellulitis has improved  Time Spent in minutes   25 minutes  Procedures   Doppler ultrasound of the lower extremities     Consults   none  DVT Prophylaxis  heparin  Medications  Scheduled Meds: . aliskiren  300 mg Oral Daily  . aspirin EC  81 mg Oral Daily  . atorvastatin  80 mg Oral Daily  . docusate sodium  100 mg Oral BID  . feeding supplement (PRO-STAT SUGAR FREE 64)  30 mL Oral BID  . folic acid  1 mg Oral Daily  . furosemide  40 mg Oral Daily  . heparin  5,000 Units Subcutaneous 3 times per day  . insulin aspart  0-5 Units Subcutaneous QHS  . insulin aspart  0-9 Units Subcutaneous TID WC  . multivitamin with minerals  1 tablet Oral Daily  . pantoprazole  40 mg Oral Daily  . perphenazine  2 mg Oral Daily  . piperacillin-tazobactam (ZOSYN)  IV  3.375 g Intravenous Q8H  . pneumococcal 23 valent vaccine  0.5 mL Intramuscular Tomorrow-1000  . sodium chloride  3 mL Intravenous Q12H  . thiamine  100 mg Oral Daily  . vancomycin  1,000 mg Intravenous Q12H  . venlafaxine XR  150  mg Oral QPC supper   Continuous Infusions:  PRN Meds:.sodium chloride, acetaminophen **OR** acetaminophen, alum & mag hydroxide-simeth, butalbital-acetaminophen-caffeine, LORazepam, methocarbamol, oxyCODONE, polyethylene glycol, promethazine, sodium chloride   Antibiotics   Anti-infectives    Start     Dose/Rate Route Frequency Ordered Stop   10/31/14 0900  vancomycin (VANCOCIN) IVPB 1000 mg/200 mL premix     1,000 mg 200 mL/hr over 60 Minutes Intravenous Every 12 hours 10/31/14 0809     10/31/14 0900  piperacillin-tazobactam (ZOSYN) IVPB 3.375 g     3.375 g 12.5 mL/hr over 240 Minutes Intravenous Every 8 hours 10/31/14 0810     10/31/14 0800  vancomycin (VANCOCIN) IVPB 1000 mg/200 mL premix  Status:  Discontinued     1,000 mg 200 mL/hr over 60 Minutes Intravenous Every 12 hours 10/30/14 1833 10/30/14 2043   10/30/14 2200  aztreonam (AZACTAM) 2 g in dextrose 5 % 50 mL IVPB  Status:  Discontinued     2 g 100 mL/hr over 30 Minutes Intravenous 3 times per day 10/30/14 2103 10/31/14 0807   10/30/14 1845  vancomycin  (VANCOCIN) 2,500 mg in sodium chloride 0.9 % 500 mL IVPB     2,500 mg 250 mL/hr over 120 Minutes Intravenous  Once 10/30/14 1833 10/30/14 2102        Subjective:   Jordan [name] was seen and examined today.  Patient denies dizziness, chest pain, shortness of breath, abdominal pain, N/V/D/C, new weakness, numbess, tingling. No acute events overnight.  Right lower leg pain is improving, still significant swelling and redness, has chronic venous insufficiency  Objective:   Blood pressure 168/53, pulse 65, temperature 97.9 F (36.6 C), temperature source Oral, resp. rate 18, height 6\' 1"  (1.854 m), weight 104.8 kg (231 lb 0.7 oz), SpO2 96 %.  Wt Readings from Last 3 Encounters:  10/30/14 104.8 kg (231 lb 0.7 oz)  10/07/14 111.131 kg (245 lb)  09/30/14 113.399 kg (250 lb)     Intake/Output Summary (Last 24 hours) at 11/01/14 1843 Last data filed at 11/01/14 1836  Gross per 24 hour  Intake    480 ml  Output    450 ml  Net     30 ml    Exam  General: Alert and oriented x 3, NAD  HEENT:  PERRLA, EOMI, Anicteic Sclera, mucous membranes moist.   Neck: Supple, no JVD, no masses  CVS: S1 S2 auscultated, no rubs, murmurs or gallops. Regular rate and rhythm.  Respiratory: Clear to auscultation bilaterally, no wheezing, rales or rhonchi  Abdomen: Soft, nontender, nondistended, + bowel sounds  Ext: no cyanosis clubbing, lymphedema with right leg warmth, erythema up to calves, chronic venous stasis changes bilaterally more on the right.      Data Review   Micro Results Recent Results (from the past 240 hour(s))  Blood Cultures x 2 sites     Status: None (Preliminary result)   Collection Time: 10/30/14  8:40 PM  Result Value Ref Range Status   Specimen Description BLOOD RARM  Final   Special Requests BOTTLES DRAWN AEROBIC AND ANAEROBIC 5CC  Final   Culture   Final           BLOOD CULTURE RECEIVED NO GROWTH TO DATE CULTURE WILL BE HELD FOR 5 DAYS BEFORE ISSUING A FINAL  NEGATIVE REPORT Performed at [facility]    Report Status PENDING  Incomplete  Blood Cultures x 2 sites     Status: None (Preliminary result)   Collection Time: 10/30/14 10:35  PM  Result Value Ref Range Status   Specimen Description BLOOD RWRIST  Final   Special Requests BOTTLES DRAWN AEROBIC AND ANAEROBIC 5CC  Final   Culture   Final           BLOOD CULTURE RECEIVED NO GROWTH TO DATE CULTURE WILL BE HELD FOR 5 DAYS BEFORE ISSUING A FINAL NEGATIVE REPORT Performed at [facility]    Report Status PENDING  Incomplete    Radiology Reports No results found.  CBC  Recent Labs Lab 10/30/14 1717 10/30/14 2228 10/31/14 0555 11/01/14 0605  WBC 7.2 6.2 9.7 8.3  HGB 12.6* 12.2* 12.4* 11.4*  HCT 39.6 39.5 39.7 37.2*  PLT 222 208 202 168  MCV 75.6* 77.0* 76.8* 78.2  MCH 24.0* 23.8* 24.0* 23.9*  MCHC 31.8 30.9 31.2 30.6  RDW 26.5* 26.8* 26.8* 26.9*  LYMPHSABS 1.5  --   --   --   MONOABS 0.8  --   --   --   EOSABS 0.2  --   --   --   BASOSABS 0.0  --   --   --     Chemistries   Recent Labs Lab 10/30/14 1717 10/30/14 2228 10/31/14 0555 11/01/14 0605  NA 134*  --  141 142  K 3.6  --  3.6 3.9  CL 104  --  103 104  CO2 25  --  29 31  GLUCOSE 93  --  88 91  BUN 9  --  10 14  CREATININE 0.79 0.99 0.89 0.97  CALCIUM 8.4*  --  8.5* 8.2*  AST  --   --  20  --   ALT  --   --  19  --   ALKPHOS  --   --  103  --   BILITOT  --   --  0.8  --    ------------------------------------------------------------------------------------------------------------------ estimated creatinine clearance is 97.8 mL/min (by C-G formula based on Cr of 0.97). ------------------------------------------------------------------------------------------------------------------  Recent Labs  10/30/14 2228  HGBA1C 5.0   ------------------------------------------------------------------------------------------------------------------ No results for input(s): CHOL, HDL, LDLCALC, TRIG,  CHOLHDL, LDLDIRECT in the last 72 hours. ------------------------------------------------------------------------------------------------------------------  Recent Labs  10/30/14 2228  TSH 1.035   ------------------------------------------------------------------------------------------------------------------  Recent Labs  10/30/14 2228  VITAMINB12 595  FERRITIN 85  TIBC 220*  IRON 40*    Coagulation profile No results for input(s): INR, PROTIME in the last 168 hours.  No results for input(s): DDIMER in the last 72 hours.  Cardiac Enzymes No results for input(s): CKMB, TROPONINI, MYOGLOBIN in the last 168 hours.  Invalid input(s): CK ------------------------------------------------------------------------------------------------------------------ Invalid input(s): [place]  10/31/14 1212 10/31/14 1650 10/31/14 2142 11/01/14 0742 11/01/14 1153 11/01/14 1724  GLUCAP 84 93 114* 80 121* 101*     Telesha Deguzman M.D. [facility] 11/01/2014, 6:43 PM  Pager: [phone]  Between 7am to 7pm - call Pager - [phone]  After 7pm go to [email or web address] - password [email or web address]  Call night coverage person covering after 7pm

## 2014-11-02 LAB — GLUCOSE, CAPILLARY
GLUCOSE-CAPILLARY: 56 mg/dL — AB (ref 65–99)
Glucose-Capillary: 110 mg/dL — ABNORMAL HIGH (ref 65–99)
Glucose-Capillary: 87 mg/dL (ref 65–99)
Glucose-Capillary: 99 mg/dL (ref 65–99)
Glucose-Capillary: 94 mg/dL (ref 65–99)

## 2014-11-02 LAB — CBC
HCT: 37.4 % — ABNORMAL LOW (ref 39.0–52.0)
Hemoglobin: 11.3 g/dL — ABNORMAL LOW (ref 13.0–17.0)
MCH: 23.4 pg — AB (ref 26.0–34.0)
MCHC: 30.2 g/dL (ref 30.0–36.0)
MCV: 77.4 fL — AB (ref 78.0–100.0)
PLATELETS: 207 10*3/uL (ref 150–400)
RBC: 4.83 MIL/uL (ref 4.22–5.81)
RDW: 26.3 % — ABNORMAL HIGH (ref 11.5–15.5)
WBC: 10.1 10*3/uL (ref 4.0–10.5)

## 2014-11-02 LAB — BASIC METABOLIC PANEL WITH GFR
Anion gap: 9 (ref 5–15)
BUN: 12 mg/dL (ref 6–20)
CO2: 30 mmol/L (ref 22–32)
Calcium: 8.4 mg/dL — ABNORMAL LOW (ref 8.9–10.3)
Chloride: 102 mmol/L (ref 101–111)
Creatinine, Ser: 0.94 mg/dL (ref 0.61–1.24)
GFR calc Af Amer: >60 mL/min
GFR calc non Af Amer: >60 mL/min
Glucose, Bld: 84 mg/dL (ref 65–99)
Potassium: 3.4 mmol/L — ABNORMAL LOW (ref 3.5–5.1)
Sodium: 141 mmol/L (ref 135–145)

## 2014-11-02 LAB — VANCOMYCIN, TROUGH: Vancomycin Tr: 14 ug/mL (ref 10.0–20.0)

## 2014-11-02 MED ORDER — HYDRALAZINE HCL 20 MG/ML IJ SOLN
10.0000 mg | Freq: Four times a day (QID) | INTRAMUSCULAR | Status: DC | PRN
  Administered 2014-11-02 – 2014-11-03 (×3): 10 mg via INTRAVENOUS
  Filled 2014-11-02 (×3): qty 1

## 2014-11-02 MED ORDER — ATENOLOL 25 MG PO TABS
25.0000 mg | ORAL_TABLET | Freq: Every day | ORAL | Status: DC
  Administered 2014-11-02 – 2014-11-04 (×3): 25 mg via ORAL
  Filled 2014-11-02 (×3): qty 1

## 2014-11-02 MED ORDER — POTASSIUM CHLORIDE CRYS ER 20 MEQ PO TBCR
40.0000 meq | EXTENDED_RELEASE_TABLET | Freq: Two times a day (BID) | ORAL | Status: AC
  Administered 2014-11-02 – 2014-11-03 (×2): 40 meq via ORAL
  Filled 2014-11-02 (×2): qty 2

## 2014-11-02 NOTE — Progress Notes (Addendum)
ANTIBIOTIC CONSULT NOTE - Follow Up  Pharmacy Consult for Vancomycin, Zosyn Indication: cellulitis, DM foot infection  Allergies  Allergen Reactions  . Altace [Ramipril]     Dizziness, migraine, weakness  . Codeine Itching  . Naproxen Itching    Patient Measurements: Height: 6\' 1"  (185.4 cm) Weight: 231 lb 0.7 oz (104.8 kg) IBW/kg (Calculated) : 79.9 Weight 111 kg as of 10/07/14  Vital Signs: Temp: 97.6 F (36.4 C) (05/15 1500) Temp Source: Oral (05/15 1500) BP: 151/56 mmHg (05/15 1500) Pulse Rate: 55 (05/15 1500) Intake/Output from previous day: 05/14 0701 - 05/15 0700 In: 480 [P.O.:480] Out: 450 [Urine:450] Intake/Output from this shift: Total I/O In: 100 [Other:100] Out: -   Labs:  Recent Labs  10/31/14 0555 11/01/14 0605 11/02/14 0600  WBC 9.7 8.3 10.1  HGB 12.4* 11.4* 11.3*  PLT 202 168 207  CREATININE 0.89 0.97 0.94   Estimated Creatinine Clearance: 101 mL/min (by C-G formula based on Cr of 0.94). No results for input(s): VANCOTROUGH, VANCOPEAK, VANCORANDOM, GENTTROUGH, GENTPEAK, GENTRANDOM, TOBRATROUGH, TOBRAPEAK, TOBRARND, AMIKACINPEAK, AMIKACINTROU, AMIKACIN in the last 72 hours.   Microbiology: Recent Results (from the past 720 hour(s))  Blood Cultures x 2 sites     Status: None (Preliminary result)   Collection Time: 10/30/14  8:40 PM  Result Value Ref Range Status   Specimen Description BLOOD RARM  Final   Special Requests BOTTLES DRAWN AEROBIC AND ANAEROBIC 5CC  Final   Culture   Final           BLOOD CULTURE RECEIVED NO GROWTH TO DATE CULTURE WILL BE HELD FOR 5 DAYS BEFORE ISSUING A FINAL NEGATIVE REPORT Performed at [facility]    Report Status PENDING  Incomplete  Blood Cultures x 2 sites     Status: None (Preliminary result)   Collection Time: 10/30/14 10:35 PM  Result Value Ref Range Status   Specimen Description BLOOD RWRIST  Final   Special Requests BOTTLES DRAWN AEROBIC AND ANAEROBIC 5CC  Final   Culture   Final   BLOOD CULTURE RECEIVED NO GROWTH TO DATE CULTURE WILL BE HELD FOR 5 DAYS BEFORE ISSUING A FINAL NEGATIVE REPORT Performed at [facility]    Report Status PENDING  Incomplete    Medical History: Past Medical History  Diagnosis Date  . Anxiety   . Arthritis   . COPD (chronic obstructive pulmonary disease)   . Depression   . Diabetes mellitus without complication   . GERD (gastroesophageal reflux disease)   . Hypertension   . Migraine   . Hyperlipidemia   . Obesity   . Sleep apnea     CPAP   . Varicose veins      Assessment: 64 yoM with history of chronic venous insufficiency presents with right leg pain and swelling.  Sent by PCP with concern for recurrent cellulitis and wants broad spectrum abx due to diabetes, chronic venous insufficiency).  Pharmacy consulted to dose vancomycin and zosyn.  Patient is afebrile, renal function WNL, CrCl~95 ml/min (normalized).  MD notes that cellulitis is improving on current abx.  5/12 >> Vanc >> 5/12 >> Aztreonam >> 5/13 5/13 >> Zosyn >>  5/12 blood x 2: ngtd  Day #4 antibiotics.  Currently on Vanc 2500 mg x1 then 1g q12h and Zosyn 3.375g IV q8h (4 hour infusion time).  Goal of Therapy:  Vancomycin trough level 10-15 mcg/ml  Doses adjusted per renal function Eradication of infection  Plan:  Check vancomycin trough tonight. F/u narrowing of antibiotics.  Hershal Coria 11/02/2014,4:55 PM

## 2014-11-02 NOTE — Progress Notes (Addendum)
[facility]                                                                              Patient Demographics  Jordan [name], is a 64 y.o. male, DOB - 1950-08-18, WER:[id number]  Admit date - 10/30/2014   Admitting Physician Allyne Gee, MD  Outpatient Primary MD for the patient is Sheela Stack, MD  LOS - 3   Chief Complaint  Patient presents with  . Leg Pain  . Leg Swelling       Brief HPI   Patient is a 64 year old male with diabetes, referral vascular disease, anxiety, COPD, GERD presented with right leg pain and swelling, worsening in the last 2 days. Patient reported that 2 days ago he was at his baseline, started to note worsening of the redness. Overnight, deteriorated more.  Patient was admitted for cellulitis. In the ED, Dopplers were done which did not show any DVT.    Assessment & Plan    Principal Problem:   Cellulitis of right leg; no DVT - Started on IV vancomycin and zosyn (for broad-spectrum coverage due to diabetes, chronic venous insufficiency), cellulitis improving.  - Recommended patient to keep leg elevated while in bed, also started on oral Lasix, not much output ,but pt reports that we are measuring it.  - Patient would benefit from lymphedema clinic either in [facility] or [facility], will need referral from his PCP, discussed with the patient, he will talk to Dr. Forde Dandy   Active Problems:   Essential hypertension, benign - high last night, started on hydralazine and added the atenolol, in addition to his tekturna and lasix.     Diabetes mellitus,- Discontinue metformin and Actos in view of his hypoglycemia. cbgs are better today. HGBA1C IS 5.     PAD (peripheral artery disease) - Continue aspirin  Hyperlipidemia  - Continue statin  Hypokalemia: possibly from lasix, replace as needed.  Recheck in am.   Code Status: Full code  Family Communication: Discussed in detail with the patient, all imaging  results, lab results explained to the patient    Disposition Plan: Home once cellulitis has improved  Time Spent in minutes   25 minutes  Procedures   Doppler ultrasound of the lower extremities   Consults   none  DVT Prophylaxis  heparin  Medications  Scheduled Meds: . aliskiren  300 mg Oral Daily  . aspirin EC  81 mg Oral Daily  . atenolol  25 mg Oral Daily  . atorvastatin  80 mg Oral Daily  . docusate sodium  100 mg Oral BID  . feeding supplement (PRO-STAT SUGAR FREE 64)  30 mL Oral BID  . folic acid  1 mg Oral Daily  . heparin  5,000 Units Subcutaneous 3 times per day  . insulin aspart  0-5 Units Subcutaneous QHS  . insulin aspart  0-9 Units Subcutaneous TID WC  . multivitamin with minerals  1 tablet Oral Daily  . pantoprazole  40 mg Oral Daily  . perphenazine  2 mg Oral Daily  . piperacillin-tazobactam (ZOSYN)  IV  3.375 g Intravenous Q8H  . pneumococcal 23 valent  vaccine  0.5 mL Intramuscular Tomorrow-1000  . potassium chloride  40 mEq Oral BID  . sodium chloride  3 mL Intravenous Q12H  . thiamine  100 mg Oral Daily  . vancomycin  1,000 mg Intravenous Q12H  . venlafaxine XR  150 mg Oral QPC supper   Continuous Infusions:  PRN Meds:.sodium chloride, acetaminophen **OR** acetaminophen, alum & mag hydroxide-simeth, butalbital-acetaminophen-caffeine, hydrALAZINE, LORazepam, methocarbamol, oxyCODONE, polyethylene glycol, promethazine, sodium chloride   Antibiotics   Anti-infectives    Start     Dose/Rate Route Frequency Ordered Stop   10/31/14 0900  vancomycin (VANCOCIN) IVPB 1000 mg/200 mL premix     1,000 mg 200 mL/hr over 60 Minutes Intravenous Every 12 hours 10/31/14 0809     10/31/14 0900  piperacillin-tazobactam (ZOSYN) IVPB 3.375 g     3.375 g 12.5 mL/hr over 240 Minutes Intravenous Every 8 hours 10/31/14 0810     10/31/14 0800  vancomycin (VANCOCIN) IVPB 1000 mg/200 mL premix  Status:  Discontinued     1,000 mg 200 mL/hr over 60 Minutes Intravenous  Every 12 hours 10/30/14 1833 10/30/14 2043   10/30/14 2200  aztreonam (AZACTAM) 2 g in dextrose 5 % 50 mL IVPB  Status:  Discontinued     2 g 100 mL/hr over 30 Minutes Intravenous 3 times per day 10/30/14 2103 10/31/14 0807   10/30/14 1845  vancomycin (VANCOCIN) 2,500 mg in sodium chloride 0.9 % 500 mL IVPB     2,500 mg 250 mL/hr over 120 Minutes Intravenous  Once 10/30/14 1833 10/30/14 2102        Subjective:   Jordan [name] was seen and examined today. Right lower extremity cellulitis improving.   Objective:   Blood pressure 151/56, pulse 55, temperature 97.6 F (36.4 C), temperature source Oral, resp. rate 18, height 6\' 1"  (1.854 m), weight 104.8 kg (231 lb 0.7 oz), SpO2 100 %.  Wt Readings from Last 3 Encounters:  10/30/14 104.8 kg (231 lb 0.7 oz)  10/07/14 111.131 kg (245 lb)  09/30/14 113.399 kg (250 lb)     Intake/Output Summary (Last 24 hours) at 11/02/14 1558 Last data filed at 11/02/14 0900  Gross per 24 hour  Intake    340 ml  Output    200 ml  Net    140 ml    Exam  General: Alert and oriented x 3, NAD  HEENT:  PERRLA, EOMI, Anicteic Sclera, mucous membranes moist.   Neck: Supple, no JVD, no masses  CVS: S1 S2 auscultated, no rubs, murmurs or gallops. Regular rate and rhythm.  Respiratory: Clear to auscultation bilaterally, no wheezing, rales or rhonchi  Abdomen: Soft, nontender, nondistended, + bowel sounds  Ext: no cyanosis clubbing, lymphedema with right leg warmth, erythema up to calves, chronic venous stasis changes bilaterally more on the right.      Data Review   Micro Results Recent Results (from the past 240 hour(s))  Blood Cultures x 2 sites     Status: None (Preliminary result)   Collection Time: 10/30/14  8:40 PM  Result Value Ref Range Status   Specimen Description BLOOD RARM  Final   Special Requests BOTTLES DRAWN AEROBIC AND ANAEROBIC 5CC  Final   Culture   Final           BLOOD CULTURE RECEIVED NO GROWTH TO DATE CULTURE  WILL BE HELD FOR 5 DAYS BEFORE ISSUING A FINAL NEGATIVE REPORT Performed at [facility]    Report Status PENDING  Incomplete  Blood Cultures x  2 sites     Status: None (Preliminary result)   Collection Time: 10/30/14 10:35 PM  Result Value Ref Range Status   Specimen Description BLOOD RWRIST  Final   Special Requests BOTTLES DRAWN AEROBIC AND ANAEROBIC 5CC  Final   Culture   Final           BLOOD CULTURE RECEIVED NO GROWTH TO DATE CULTURE WILL BE HELD FOR 5 DAYS BEFORE ISSUING A FINAL NEGATIVE REPORT Performed at [facility]    Report Status PENDING  Incomplete    Radiology Reports No results found.  CBC  Recent Labs Lab 10/30/14 1717 10/30/14 2228 10/31/14 0555 11/01/14 0605 11/02/14 0600  WBC 7.2 6.2 9.7 8.3 10.1  HGB 12.6* 12.2* 12.4* 11.4* 11.3*  HCT 39.6 39.5 39.7 37.2* 37.4*  PLT 222 208 202 168 207  MCV 75.6* 77.0* 76.8* 78.2 77.4*  MCH 24.0* 23.8* 24.0* 23.9* 23.4*  MCHC 31.8 30.9 31.2 30.6 30.2  RDW 26.5* 26.8* 26.8* 26.9* 26.3*  LYMPHSABS 1.5  --   --   --   --   MONOABS 0.8  --   --   --   --   EOSABS 0.2  --   --   --   --   BASOSABS 0.0  --   --   --   --     Chemistries   Recent Labs Lab 10/30/14 1717 10/30/14 2228 10/31/14 0555 11/01/14 0605 11/02/14 0600  NA 134*  --  141 142 141  K 3.6  --  3.6 3.9 3.4*  CL 104  --  103 104 102  CO2 25  --  29 31 30   GLUCOSE 93  --  88 91 84  BUN 9  --  10 14 12   CREATININE 0.79 0.99 0.89 0.97 0.94  CALCIUM 8.4*  --  8.5* 8.2* 8.4*  AST  --   --  20  --   --   ALT  --   --  19  --   --   ALKPHOS  --   --  103  --   --   BILITOT  --   --  0.8  --   --    ------------------------------------------------------------------------------------------------------------------ estimated creatinine clearance is 101 mL/min (by C-G formula based on Cr of 0.94). ------------------------------------------------------------------------------------------------------------------  Recent Labs   10/30/14 2228  HGBA1C 5.0   ------------------------------------------------------------------------------------------------------------------ No results for input(s): CHOL, HDL, LDLCALC, TRIG, CHOLHDL, LDLDIRECT in the last 72 hours. ------------------------------------------------------------------------------------------------------------------  Recent Labs  10/30/14 2228  TSH 1.035   ------------------------------------------------------------------------------------------------------------------  Recent Labs  10/30/14 2228  VITAMINB12 595  FERRITIN 85  TIBC 220*  IRON 40*    Coagulation profile No results for input(s): INR, PROTIME in the last 168 hours.  No results for input(s): DDIMER in the last 72 hours.  Cardiac Enzymes No results for input(s): CKMB, TROPONINI, MYOGLOBIN in the last 168 hours.  Invalid input(s): CK ------------------------------------------------------------------------------------------------------------------ Invalid input(s): [place]  11/01/14 1153 11/01/14 1724 11/01/14 2207 11/02/14 0739 11/02/14 1017 11/02/14 1211  GLUCAP 121* 101* 169* 37* 110* 97     Ronan Duecker M.D. [facility] 11/02/2014, 3:58 PM  Pager: [phone]  Between 7am to 7pm - call Pager - [phone]  After 7pm go to [email or web address] - password [email or web address]  Call night coverage person covering after 7pm

## 2014-11-03 LAB — CBC
HCT: 38.4 % — ABNORMAL LOW (ref 39.0–52.0)
HEMOGLOBIN: 12.1 g/dL — AB (ref 13.0–17.0)
MCH: 24.1 pg — AB (ref 26.0–34.0)
MCHC: 31.5 g/dL (ref 30.0–36.0)
MCV: 76.5 fL — AB (ref 78.0–100.0)
Platelets: 199 10*3/uL (ref 150–400)
RBC: 5.02 MIL/uL (ref 4.22–5.81)
RDW: 26.5 % — ABNORMAL HIGH (ref 11.5–15.5)
WBC: 7.8 10*3/uL (ref 4.0–10.5)

## 2014-11-03 LAB — BASIC METABOLIC PANEL
ANION GAP: 11 (ref 5–15)
BUN: 9 mg/dL (ref 6–20)
CO2: 27 mmol/L (ref 22–32)
Calcium: 8.5 mg/dL — ABNORMAL LOW (ref 8.9–10.3)
Chloride: 101 mmol/L (ref 101–111)
Creatinine, Ser: 0.86 mg/dL (ref 0.61–1.24)
GFR calc Af Amer: >60 mL/min (ref >60)
GFR calc non Af Amer: >60 mL/min (ref >60)
Glucose, Bld: 82 mg/dL (ref 65–99)
Potassium: 3.6 mmol/L (ref 3.5–5.1)
Sodium: 139 mmol/L (ref 135–145)

## 2014-11-03 LAB — GLUCOSE, CAPILLARY
GLUCOSE-CAPILLARY: 72 mg/dL (ref 65–99)
Glucose-Capillary: 96 mg/dL (ref 65–99)
Glucose-Capillary: 108 mg/dL — ABNORMAL HIGH (ref 65–99)

## 2014-11-03 LAB — FOLATE RBC
FOLATE, RBC: 1346 ng/mL (ref >498)
Folate, Hemolysate: 523.6 ng/mL
Hematocrit: 38.9 % (ref 37.5–51.0)

## 2014-11-03 MED ORDER — HYDRALAZINE HCL 25 MG PO TABS
25.0000 mg | ORAL_TABLET | Freq: Three times a day (TID) | ORAL | Status: DC
  Administered 2014-11-03 – 2014-11-04 (×4): 25 mg via ORAL
  Filled 2014-11-03 (×6): qty 1

## 2014-11-03 MED ORDER — FUROSEMIDE 40 MG PO TABS
40.0000 mg | ORAL_TABLET | Freq: Two times a day (BID) | ORAL | Status: DC
  Administered 2014-11-03 – 2014-11-04 (×3): 40 mg via ORAL
  Filled 2014-11-03 (×5): qty 1

## 2014-11-03 NOTE — Progress Notes (Signed)
ANTIBIOTIC CONSULT NOTE - FOLLOW UP  Pharmacy Consult for Vancomycin Indication: cellulitis, DM foot infection  Allergies  Allergen Reactions  . Altace [Ramipril]     Dizziness, migraine, weakness  . Codeine Itching  . Naproxen Itching    Patient Measurements: Height: 6\' 1"  (185.4 cm) Weight: 231 lb 0.7 oz (104.8 kg) IBW/kg (Calculated) : 79.9 Adjusted Body Weight:   Vital Signs: Temp: 98 F (36.7 C) (05/15 2131) Temp Source: Oral (05/15 2131) BP: 170/71 mmHg (05/16 0030) Pulse Rate: 53 (05/16 0030) Intake/Output from previous day: 05/15 0701 - 05/16 0700 In: 100  Out: -  Intake/Output from this shift:    Labs:  Recent Labs  10/31/14 0555 11/01/14 0605 11/02/14 0600  WBC 9.7 8.3 10.1  HGB 12.4* 11.4* 11.3*  PLT 202 168 207  CREATININE 0.89 0.97 0.94   Estimated Creatinine Clearance: 101 mL/min (by C-G formula based on Cr of 0.94).  Recent Labs  11/02/14 2031  [facility] 14     Microbiology: Recent Results (from the past 720 hour(s))  Blood Cultures x 2 sites     Status: None (Preliminary result)   Collection Time: 10/30/14  8:40 PM  Result Value Ref Range Status   Specimen Description BLOOD RARM  Final   Special Requests BOTTLES DRAWN AEROBIC AND ANAEROBIC 5CC  Final   Culture   Final           BLOOD CULTURE RECEIVED NO GROWTH TO DATE CULTURE WILL BE HELD FOR 5 DAYS BEFORE ISSUING A FINAL NEGATIVE REPORT Performed at [facility]    Report Status PENDING  Incomplete  Blood Cultures x 2 sites     Status: None (Preliminary result)   Collection Time: 10/30/14 10:35 PM  Result Value Ref Range Status   Specimen Description BLOOD RWRIST  Final   Special Requests BOTTLES DRAWN AEROBIC AND ANAEROBIC 5CC  Final   Culture   Final           BLOOD CULTURE RECEIVED NO GROWTH TO DATE CULTURE WILL BE HELD FOR 5 DAYS BEFORE ISSUING A FINAL NEGATIVE REPORT Performed at [facility]    Report Status PENDING  Incomplete    Anti-infectives    Start     Dose/Rate Route Frequency Ordered Stop   10/31/14 0900  vancomycin (VANCOCIN) IVPB 1000 mg/200 mL premix     1,000 mg 200 mL/hr over 60 Minutes Intravenous Every 12 hours 10/31/14 0809     10/31/14 0900  piperacillin-tazobactam (ZOSYN) IVPB 3.375 g     3.375 g 12.5 mL/hr over 240 Minutes Intravenous Every 8 hours 10/31/14 0810     10/31/14 0800  vancomycin (VANCOCIN) IVPB 1000 mg/200 mL premix  Status:  Discontinued     1,000 mg 200 mL/hr over 60 Minutes Intravenous Every 12 hours 10/30/14 1833 10/30/14 2043   10/30/14 2200  aztreonam (AZACTAM) 2 g in dextrose 5 % 50 mL IVPB  Status:  Discontinued     2 g 100 mL/hr over 30 Minutes Intravenous 3 times per day 10/30/14 2103 10/31/14 0807   10/30/14 1845  vancomycin (VANCOCIN) 2,500 mg in sodium chloride 0.9 % 500 mL IVPB     2,500 mg 250 mL/hr over 120 Minutes Intravenous  Once 10/30/14 1833 10/30/14 2102      Assessment: Patient with vancomycin level at goal.    Goal of Therapy:  Vancomycin trough level 10-15 mcg/ml  Plan:  Measure antibiotic drug levels at steady state Follow up culture results  Continue with  current dosing  Nani Skillern Crowford 11/03/2014,4:43 AM

## 2014-11-03 NOTE — Progress Notes (Signed)
[facility]                                                                              Patient Demographics  Jordan [name], is a 64 y.o. male, DOB - [birth year], CHE:[id number]  Admit date - 10/30/2014   Admitting Physician Allyne Gee, MD  Outpatient Primary MD for the patient is Sheela Stack, MD  LOS - 4   Chief Complaint  Patient presents with  . Leg Pain  . Leg Swelling       Brief HPI   Patient is a 64 year old male with diabetes, referral vascular disease, anxiety, COPD, GERD presented with right leg pain and swelling, worsening in the last 2 days. Patient reported that 2 days ago he was at his baseline, started to note worsening of the redness. Overnight, deteriorated more.  Patient was admitted for cellulitis. In the ED, Dopplers were done which did not show any DVT.    Assessment & Plan    Principal Problem:   Cellulitis of right leg; no DVT - Started on IV vancomycin and zosyn (for broad-spectrum coverage due to diabetes, chronic venous insufficiency), cellulitis improving.  - Recommended patient to keep leg elevated while in bed, also started on oral Lasix, not much output ,but pt reports that we are not measuring it as he is using the bathroom. Increased his lasix to twice daily. Watch his potassium.  - Patient would benefit from lymphedema clinic either in [facility] or [facility], will need referral from his PCP, discussed with the patient, he will talk to Dr. Forde Dandy   Active Problems:   Essential hypertension, benign - high last night, started on hydralazine and added the atenolol, in addition to his tekturna and lasix. Added oral hydralazine today.     Diabetes mellitus,- Discontinue metformin and Actos in view of his hypoglycemia. cbgs are better today. HGBA1C IS 5.     PAD (peripheral artery disease) - Continue aspirin  Hyperlipidemia  - Continue statin  Hypokalemia: possibly from lasix, replace as needed.    Recheck in am.   Code Status: Full code  Family Communication: Discussed in detail with the patient, all imaging results, lab results explained to the patient    Disposition Plan: Home once cellulitis has improved  Time Spent in minutes   25 minutes  Procedures   Doppler ultrasound of the lower extremities   Consults   none  DVT Prophylaxis  heparin  Medications  Scheduled Meds: . aliskiren  300 mg Oral Daily  . aspirin EC  81 mg Oral Daily  . atenolol  25 mg Oral Daily  . atorvastatin  80 mg Oral Daily  . docusate sodium  100 mg Oral BID  . feeding supplement (PRO-STAT SUGAR FREE 64)  30 mL Oral BID  . folic acid  1 mg Oral Daily  . furosemide  40 mg Oral BID  . heparin  5,000 Units Subcutaneous 3 times per day  . hydrALAZINE  25 mg Oral 3 times per day  . insulin aspart  0-5 Units Subcutaneous QHS  . insulin aspart  0-9 Units Subcutaneous TID WC  .  multivitamin with minerals  1 tablet Oral Daily  . pantoprazole  40 mg Oral Daily  . perphenazine  2 mg Oral Daily  . piperacillin-tazobactam (ZOSYN)  IV  3.375 g Intravenous Q8H  . pneumococcal 23 valent vaccine  0.5 mL Intramuscular Tomorrow-1000  . sodium chloride  3 mL Intravenous Q12H  . thiamine  100 mg Oral Daily  . vancomycin  1,000 mg Intravenous Q12H  . venlafaxine XR  150 mg Oral QPC supper   Continuous Infusions:  PRN Meds:.sodium chloride, acetaminophen **OR** acetaminophen, alum & mag hydroxide-simeth, butalbital-acetaminophen-caffeine, hydrALAZINE, LORazepam, methocarbamol, oxyCODONE, polyethylene glycol, promethazine, sodium chloride   Antibiotics   Anti-infectives    Start     Dose/Rate Route Frequency Ordered Stop   10/31/14 0900  vancomycin (VANCOCIN) IVPB 1000 mg/200 mL premix     1,000 mg 200 mL/hr over 60 Minutes Intravenous Every 12 hours 10/31/14 0809     10/31/14 0900  piperacillin-tazobactam (ZOSYN) IVPB 3.375 g     3.375 g 12.5 mL/hr over 240 Minutes Intravenous Every 8 hours 10/31/14  0810     10/31/14 0800  vancomycin (VANCOCIN) IVPB 1000 mg/200 mL premix  Status:  Discontinued     1,000 mg 200 mL/hr over 60 Minutes Intravenous Every 12 hours 10/30/14 1833 10/30/14 2043   10/30/14 2200  aztreonam (AZACTAM) 2 g in dextrose 5 % 50 mL IVPB  Status:  Discontinued     2 g 100 mL/hr over 30 Minutes Intravenous 3 times per day 10/30/14 2103 10/31/14 0807   10/30/14 1845  vancomycin (VANCOCIN) 2,500 mg in sodium chloride 0.9 % 500 mL IVPB     2,500 mg 250 mL/hr over 120 Minutes Intravenous  Once 10/30/14 1833 10/30/14 2102        Subjective:   Jordan [name] was seen and examined today. Right lower extremity cellulitis improving.   Objective:   Blood pressure 132/52, pulse 51, temperature 97.9 F (36.6 C), temperature source Oral, resp. rate 18, height 6\' 1"  (1.854 m), weight 104.8 kg (231 lb 0.7 oz), SpO2 93 %.  Wt Readings from Last 3 Encounters:  10/30/14 104.8 kg (231 lb 0.7 oz)  10/07/14 111.131 kg (245 lb)  09/30/14 113.399 kg (250 lb)     Intake/Output Summary (Last 24 hours) at 11/03/14 1535 Last data filed at 11/03/14 0900  Gross per 24 hour  Intake    240 ml  Output      0 ml  Net    240 ml    Exam  General: Alert and oriented x 3, NAD  HEENT:  PERRLA, EOMI, Anicteic Sclera, mucous membranes moist.   Neck: Supple, no JVD, no masses  CVS: S1 S2 auscultated, no rubs, murmurs or gallops. Regular rate and rhythm.  Respiratory: Clear to auscultation bilaterally, no wheezing, rales or rhonchi  Abdomen: Soft, nontender, nondistended, + bowel sounds  Ext: no cyanosis clubbing, lymphedema with right leg warmth, erythema up to calves, chronic venous stasis changes bilaterally more on the right.      Data Review   Micro Results Recent Results (from the past 240 hour(s))  Blood Cultures x 2 sites     Status: None (Preliminary result)   Collection Time: 10/30/14  8:40 PM  Result Value Ref Range Status   Specimen Description BLOOD RARM  Final    Special Requests BOTTLES DRAWN AEROBIC AND ANAEROBIC 5CC  Final   Culture   Final           BLOOD CULTURE RECEIVED NO  GROWTH TO DATE CULTURE WILL BE HELD FOR 5 DAYS BEFORE ISSUING A FINAL NEGATIVE REPORT Performed at [facility]    Report Status PENDING  Incomplete  Blood Cultures x 2 sites     Status: None (Preliminary result)   Collection Time: 10/30/14 10:35 PM  Result Value Ref Range Status   Specimen Description BLOOD RWRIST  Final   Special Requests BOTTLES DRAWN AEROBIC AND ANAEROBIC 5CC  Final   Culture   Final           BLOOD CULTURE RECEIVED NO GROWTH TO DATE CULTURE WILL BE HELD FOR 5 DAYS BEFORE ISSUING A FINAL NEGATIVE REPORT Performed at [facility]    Report Status PENDING  Incomplete    Radiology Reports No results found.  CBC  Recent Labs Lab 10/30/14 1717 10/30/14 2228 10/31/14 0555 11/01/14 0605 11/02/14 0600 11/03/14 0605  WBC 7.2 6.2 9.7 8.3 10.1 7.8  HGB 12.6* 12.2* 12.4* 11.4* 11.3* 12.1*  HCT 39.6 39.5 39.7 37.2* 37.4* 38.4*  PLT 222 208 202 168 207 199  MCV 75.6* 77.0* 76.8* 78.2 77.4* 76.5*  MCH 24.0* 23.8* 24.0* 23.9* 23.4* 24.1*  MCHC 31.8 30.9 31.2 30.6 30.2 31.5  RDW 26.5* 26.8* 26.8* 26.9* 26.3* 26.5*  LYMPHSABS 1.5  --   --   --   --   --   MONOABS 0.8  --   --   --   --   --   EOSABS 0.2  --   --   --   --   --   BASOSABS 0.0  --   --   --   --   --     Chemistries   Recent Labs Lab 10/30/14 1717 10/30/14 2228 10/31/14 0555 11/01/14 0605 11/02/14 0600 11/03/14 0605  NA 134*  --  141 142 141 139  K 3.6  --  3.6 3.9 3.4* 3.6  CL 104  --  103 104 102 101  CO2 25  --  29 31 30 27   GLUCOSE 93  --  88 91 84 82  BUN 9  --  10 14 12 9   CREATININE 0.79 0.99 0.89 0.97 0.94 0.86  CALCIUM 8.4*  --  8.5* 8.2* 8.4* 8.5*  AST  --   --  20  --   --   --   ALT  --   --  19  --   --   --   ALKPHOS  --   --  103  --   --   --   BILITOT  --   --  0.8  --   --   --     ------------------------------------------------------------------------------------------------------------------ estimated creatinine clearance is 110.3 mL/min (by C-G formula based on Cr of 0.86). ------------------------------------------------------------------------------------------------------------------ No results for input(s): HGBA1C in the last 72 hours. ------------------------------------------------------------------------------------------------------------------ No results for input(s): CHOL, HDL, LDLCALC, TRIG, CHOLHDL, LDLDIRECT in the last 72 hours. ------------------------------------------------------------------------------------------------------------------ No results for input(s): TSH, T4TOTAL, T3FREE, THYROIDAB in the last 72 hours.  Invalid input(s): FREET3 ------------------------------------------------------------------------------------------------------------------ No results for input(s): VITAMINB12, FOLATE, FERRITIN, TIBC, IRON, RETICCTPCT in the last 72 hours.  Coagulation profile No results for input(s): INR, PROTIME in the last 168 hours.  No results for input(s): DDIMER in the last 72 hours.  Cardiac Enzymes No results for input(s): CKMB, TROPONINI, MYOGLOBIN in the last 168 hours.  Invalid input(s): CK ------------------------------------------------------------------------------------------------------------------ Invalid input(s): [place]  11/02/14 1017 11/02/14 1211 11/02/14 1621 11/02/14 2210 11/03/14 0720  11/03/14 1203  GLUCAP 110* 87 99 94 96 72     Kadance Mccuistion M.D. [facility] 11/03/2014, 3:35 PM  Pager: [phone]  Between 7am to 7pm - call Pager - [phone]  After 7pm go to [email or web address] - password [email or web address]  Call night coverage person covering after 7pm

## 2014-11-03 NOTE — Care Management Note (Addendum)
Case Management Note  Patient Details  Name: NUR KRASINSKI MRN: [id number] Date of Birth: 1951-04-12  Subjective/Objective:      64 yo male admitted with cellulitis and possible DM foot ulcer from home              Action/Plan: 11/04/14 CM consulted to provide information regarding lymphedema clinic to patient prior to discharge so he can provide information to PCP for follow up. Information provided to patient and questions and concerns addressed.  Received CM consult for medication needs. Patient stated he has a PCP that he has a pharmacy and does not have affordability concerns. No other needs identified at this time.  Expected Discharge Date:   (unknown)               Expected Discharge Plan:  Home/Self Care  In-House Referral:     Discharge planning Services  CM Consult  Post Acute Care Choice:    Choice offered to:     DME Arranged:    DME Agency:     HH Arranged:    [place] Agency:     Status of Service:  Completed, signed off  Medicare Important Message Given:  Yes Date Medicare IM Given:  11/03/14 Medicare IM give by:  Leanne Chang Date Additional Medicare IM Given:    Additional Medicare Important Message give by:     If discussed at [place] of Stay Meetings, dates discussed:    Additional Comments:  Scot Dock, RN 11/03/2014, 2:29 PM

## 2014-11-04 LAB — BASIC METABOLIC PANEL
Anion gap: 9 (ref 5–15)
BUN: 13 mg/dL (ref 6–20)
CHLORIDE: 102 mmol/L (ref 101–111)
CO2: 30 mmol/L (ref 22–32)
Calcium: 8.5 mg/dL — ABNORMAL LOW (ref 8.9–10.3)
Creatinine, Ser: 1.2 mg/dL (ref 0.61–1.24)
GFR calc non Af Amer: >60 mL/min (ref >60)
Glucose, Bld: 105 mg/dL — ABNORMAL HIGH (ref 65–99)
Potassium: 3.7 mmol/L (ref 3.5–5.1)
Sodium: 141 mmol/L (ref 135–145)

## 2014-11-04 LAB — GLUCOSE, CAPILLARY
GLUCOSE-CAPILLARY: 153 mg/dL — AB (ref 65–99)
GLUCOSE-CAPILLARY: 85 mg/dL (ref 65–99)
GLUCOSE-CAPILLARY: 85 mg/dL (ref 65–99)

## 2014-11-04 MED ORDER — ATENOLOL 25 MG PO TABS: 25.0000 mg | ORAL_TABLET | Freq: Every day | ORAL | Status: DC

## 2014-11-04 MED ORDER — DOXYCYCLINE HYCLATE 50 MG PO CAPS: 50.0000 mg | ORAL_CAPSULE | Freq: Two times a day (BID) | ORAL | Status: DC

## 2014-11-04 MED ORDER — HYDRALAZINE HCL 25 MG PO TABS: 25.0000 mg | ORAL_TABLET | Freq: Three times a day (TID) | ORAL | Status: DC

## 2014-11-04 MED ORDER — FOLIC ACID 1 MG PO TABS: 1.0000 mg | ORAL_TABLET | Freq: Every day | ORAL | Status: DC

## 2014-11-04 NOTE — Clinical Documentation Improvement (Addendum)
Patient admitted for cellulitis of the right leg.   Please review the [place] wound consult note completed by Domenic Moras RN BSN dated 10/31/14 regarding an ulcer on the plantar aspect of the right great toe.  Patient has known diabetic peripheral vascular disease and chronic venous insufficiency per the current medical record   If you agree with the WOC nurse's assessment and plan, please document the location and whether or not the ulcer is associated with the patient's diabetes and/or a diabetic related condition.  Please also document if the ulcer was present on admission.   Thank You, Erling Conte ,RN Clinical Documentation Specialist [phone] [place] Information Management

## 2014-11-04 NOTE — Discharge Summary (Signed)
Physician Discharge Summary  Jordan [name] URK:[id number] DOB: [birth year] DOA: 10/30/2014  PCP: Sheela Stack, MD  Admit date: 10/30/2014 Discharge date: 11/04/2014  Time spent: 30 minutes  Recommendations for Outpatient Follow-up:  1. Follow up with PCP in one week 2. Follow up with lymphedema clinic as recommended.   Discharge Diagnoses:  Principal Problem:   Cellulitis of right leg Active Problems:   Essential hypertension, benign   Diabetes   PAD (peripheral artery disease)   Chronic venous insufficiency   Discharge Condition: improved.   Diet recommendation: low sodium carb modified diet.   Filed Weights   10/30/14 1955 10/30/14 2042  Weight: 108.863 kg (240 lb) 104.8 kg (231 lb 0.7 oz)    History of present illness:  Patient is a 64 year old male with diabetes, referral vascular disease, anxiety, COPD, GERD presented with right leg pain and swelling, worsening in the last 2 days. Patient reported that 2 days ago he was at his baseline, started to note worsening of the redness. Overnight, deteriorated more. Patient was admitted for cellulitis. In the ED, Dopplers were done which did not show any DVT.  Hospital Course:   Cellulitis of right leg; no DVT - Started on IV vancomycin and zosyn (for broad-spectrum coverage due to diabetes, chronic venous insufficiency), cellulitis improving.  - Recommended patient to keep leg elevated while in bed, also started on oral Lasix, not much output ,but pt reports that we are not measuring it as he is using the bathroom.   - Patient would benefit from lymphedema clinic either in [facility] or [facility], will need referral from his PCP, discussed with the patient, he will talk to Dr. 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   Active Problems:  Essential hypertension, benign Sub optimal,  started on hydralazine and  added the atenolol, in addition to his tekturna and lasix. Added oral hydralazine and his bp is much better.     Diabetes mellitus,- Discontinue metformin and Actos in view of his hypoglycemia. cbgs are better today. HGBA1C IS 5.    PAD (peripheral artery disease) - Continue aspirin  Hyperlipidemia  - Continue statin  Hypokalemia: possibly from lasix, replace as needed.    Procedures:  none  Consultations:  none  Discharge Exam: Filed Vitals:   11/04/14 1513  BP: 145/60  Pulse: 52  Temp: 98.1 F (36.7 C)  Resp: 20    General: alert afebrile comfortable Cardiovascular: s1s2 Respiratory: ctab Lower extremities: 3+ edema much improved. 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   Discharge Instructions   Discharge Instructions    Diet - low sodium heart healthy    Complete by:  As directed      Discharge instructions    Complete by:  As directed   Follow up with lymphaedema clinic as recommended in one week.          Current Discharge Medication List    START taking these medications   Details  doxycycline (VIBRAMYCIN) 50 MG capsule Take 1 capsule (50 mg total) by mouth 2 (two) times daily. Qty: 8 capsule, Refills: 0    folic acid (FOLVITE) 1 MG tablet Take 1 tablet (1 mg total) by mouth daily. Qty: 30 tablet, Refills: 0    hydrALAZINE (APRESOLINE) 25 MG tablet Take 1 tablet (25 mg total) by mouth every 8 (eight) hours. Qty: 90 tablet, Refills:  0      CONTINUE these medications which have CHANGED   Details  atenolol (TENORMIN) 25 MG tablet Take 1 tablet (25 mg total) by mouth daily. Qty: 30 tablet, Refills: 0      CONTINUE these medications which have NOT CHANGED   Details  aliskiren (TEKTURNA) 300 MG tablet Take 300 mg by mouth daily after supper.     aspirin 81 MG tablet Take 81 mg by mouth daily.    atorvastatin (LIPITOR) 80 MG tablet Take 80 mg by mouth daily after supper.      butalbital-acetaminophen-caffeine (FIORICET, ESGIC) 50-325-40 MG per tablet Take 1 tablet by mouth 2 (two) times daily as needed for pain or headache.    LORazepam (ATIVAN) 1 MG tablet Take 1 mg by mouth every 8 (eight) hours as needed for anxiety.    methocarbamol (ROBAXIN) 500 MG tablet Take 500 mg by mouth 4 (four) times daily as needed (for muscle spasm.).    Multiple Vitamin (MULTIVITAMIN WITH MINERALS) TABS Take 1 tablet by mouth daily.    omeprazole (PRILOSEC) 20 MG capsule Take 20 mg by mouth daily.    perphenazine (TRILAFON) 2 MG tablet Take 2 mg by mouth daily after supper.     promethazine (PHENERGAN) 25 MG tablet Take 25 mg by mouth every 4 (four) hours as needed for nausea.    venlafaxine XR (EFFEXOR-XR) 75 MG 24 hr capsule Take 150 mg by mouth daily after supper. BRAND NAME ONLY    furosemide (LASIX) 40 MG tablet Take 40 mg by mouth as needed for fluid.       STOP taking these medications     pioglitazone (ACTOS) 15 MG tablet      metFORMIN (GLUCOPHAGE) 500 MG tablet        Allergies  Allergen Reactions  . Altace [Ramipril]     Dizziness, migraine, weakness  . Codeine Itching  . Naproxen Itching      The results of significant diagnostics from this hospitalization (including imaging, microbiology, ancillary and laboratory) are listed below for reference.    Significant Diagnostic Studies: No results found.  Microbiology: Recent Results (from the past 240 hour(s))  Blood Cultures x 2 sites     Status: None (Preliminary result)   Collection Time: 10/30/14  8:40 PM  Result Value Ref Range Status   Specimen Description BLOOD RARM  Final   Special Requests BOTTLES DRAWN AEROBIC AND ANAEROBIC 5CC  Final   Culture   Final           BLOOD CULTURE RECEIVED NO GROWTH TO DATE CULTURE WILL BE HELD FOR 5 DAYS BEFORE ISSUING A FINAL NEGATIVE REPORT Performed at [facility]    Report Status PENDING  Incomplete  Blood Cultures x 2 sites     Status: None  (Preliminary result)   Collection Time: 10/30/14 10:35 PM  Result Value Ref Range Status   Specimen Description BLOOD RWRIST  Final   Special Requests BOTTLES DRAWN AEROBIC AND ANAEROBIC 5CC  Final   Culture   Final           BLOOD CULTURE RECEIVED NO GROWTH TO DATE CULTURE WILL BE HELD FOR 5 DAYS BEFORE ISSUING A FINAL NEGATIVE REPORT Performed at [facility]    Report Status PENDING  Incomplete     Labs: Basic Metabolic Panel:  Recent Labs Lab 10/31/14 0555 11/01/14 0605 11/02/14 0600 11/03/14 0605 11/04/14 1300  NA 141 142 141 139 141  K 3.6 3.9 3.4* 3.6  3.7  CL 103 104 102 101 102  CO2 29 31 30 27 30   GLUCOSE 88 91 84 82 105*  BUN 10 14 12 9 13   CREATININE 0.89 0.97 0.94 0.86 1.20  CALCIUM 8.5* 8.2* 8.4* 8.5* 8.5*   Liver Function Tests:  Recent Labs Lab 10/31/14 0555  AST 20  ALT 19  ALKPHOS 103  BILITOT 0.8  PROT 6.4*  ALBUMIN 3.0*   No results for input(s): LIPASE, AMYLASE in the last 168 hours. No results for input(s): AMMONIA in the last 168 hours. CBC:  Recent Labs Lab 10/30/14 1717 10/30/14 2228 10/31/14 0555 11/01/14 0605 11/02/14 0600 11/03/14 0605  WBC 7.2 6.2 9.7 8.3 10.1 7.8  NEUTROABS 4.7  --   --   --   --   --   HGB 12.6* 12.2* 12.4* 11.4* 11.3* 12.1*  HCT 39.6 39.5  38.9 39.7 37.2* 37.4* 38.4*  MCV 75.6* 77.0* 76.8* 78.2 77.4* 76.5*  PLT 222 208 202 168 207 199   Cardiac Enzymes: No results for input(s): CKTOTAL, CKMB, CKMBINDEX, TROPONINI in the last 168 hours. BNP: BNP (last 3 results) No results for input(s): BNP in the last 8760 hours.  ProBNP (last 3 results) No results for input(s): PROBNP in the last 8760 hours.  CBG:  Recent Labs Lab 11/03/14 1203 11/03/14 1649 11/03/14 2134 11/04/14 0730 11/04/14 1225  GLUCAP 72 108* 153* 85 85       Signed:  Shakea Isip  [facility] 11/04/2014, 3:14 PM

## 2014-11-04 NOTE — Progress Notes (Signed)
Patient discharge home, alert and oriented, discharge instructions given, patient verbalize understanding of discharge instructions given, My Chart access declined at this time, patient in stable condition at this time 

## 2014-11-06 LAB — CULTURE, BLOOD (ROUTINE X 2)
Culture: NO GROWTH
Culture: NO GROWTH

## 2015-01-26 ENCOUNTER — Other Ambulatory Visit: Payer: Self-pay | Admitting: Physician Assistant

## 2016-11-23 ENCOUNTER — Encounter: Payer: Self-pay | Admitting: Internal Medicine

## 2017-03-13 ENCOUNTER — Encounter (HOSPITAL_COMMUNITY): Payer: Self-pay

## 2017-03-13 DIAGNOSIS — L304 Erythema intertrigo: Secondary | ICD-10-CM | POA: Diagnosis present

## 2017-03-13 DIAGNOSIS — I890 Lymphedema, not elsewhere classified: Secondary | ICD-10-CM | POA: Diagnosis present

## 2017-03-13 DIAGNOSIS — L89150 Pressure ulcer of sacral region, unstageable: Secondary | ICD-10-CM | POA: Diagnosis present

## 2017-03-13 DIAGNOSIS — I872 Venous insufficiency (chronic) (peripheral): Secondary | ICD-10-CM | POA: Diagnosis present

## 2017-03-13 DIAGNOSIS — I10 Essential (primary) hypertension: Secondary | ICD-10-CM | POA: Diagnosis present

## 2017-03-13 DIAGNOSIS — E785 Hyperlipidemia, unspecified: Secondary | ICD-10-CM | POA: Diagnosis present

## 2017-03-13 DIAGNOSIS — L03115 Cellulitis of right lower limb: Secondary | ICD-10-CM | POA: Diagnosis not present

## 2017-03-13 DIAGNOSIS — E11621 Type 2 diabetes mellitus with foot ulcer: Secondary | ICD-10-CM | POA: Diagnosis present

## 2017-03-13 DIAGNOSIS — M79641 Pain in right hand: Secondary | ICD-10-CM | POA: Diagnosis present

## 2017-03-13 DIAGNOSIS — F17210 Nicotine dependence, cigarettes, uncomplicated: Secondary | ICD-10-CM | POA: Diagnosis present

## 2017-03-13 DIAGNOSIS — Z6829 Body mass index (BMI) 29.0-29.9, adult: Secondary | ICD-10-CM

## 2017-03-13 DIAGNOSIS — R32 Unspecified urinary incontinence: Secondary | ICD-10-CM | POA: Diagnosis present

## 2017-03-13 DIAGNOSIS — Z7982 Long term (current) use of aspirin: Secondary | ICD-10-CM

## 2017-03-13 DIAGNOSIS — L97519 Non-pressure chronic ulcer of other part of right foot with unspecified severity: Secondary | ICD-10-CM | POA: Diagnosis present

## 2017-03-13 DIAGNOSIS — L89300 Pressure ulcer of unspecified buttock, unstageable: Secondary | ICD-10-CM | POA: Diagnosis present

## 2017-03-13 DIAGNOSIS — G4730 Sleep apnea, unspecified: Secondary | ICD-10-CM | POA: Diagnosis present

## 2017-03-13 DIAGNOSIS — Z79899 Other long term (current) drug therapy: Secondary | ICD-10-CM

## 2017-03-13 DIAGNOSIS — L03116 Cellulitis of left lower limb: Principal | ICD-10-CM | POA: Diagnosis present

## 2017-03-13 DIAGNOSIS — F419 Anxiety disorder, unspecified: Secondary | ICD-10-CM | POA: Diagnosis present

## 2017-03-13 DIAGNOSIS — J449 Chronic obstructive pulmonary disease, unspecified: Secondary | ICD-10-CM | POA: Diagnosis present

## 2017-03-13 DIAGNOSIS — F329 Major depressive disorder, single episode, unspecified: Secondary | ICD-10-CM | POA: Diagnosis present

## 2017-03-13 DIAGNOSIS — E1151 Type 2 diabetes mellitus with diabetic peripheral angiopathy without gangrene: Secondary | ICD-10-CM | POA: Diagnosis present

## 2017-03-13 DIAGNOSIS — K219 Gastro-esophageal reflux disease without esophagitis: Secondary | ICD-10-CM | POA: Diagnosis present

## 2017-03-13 NOTE — ED Notes (Signed)
Assisted patient with urinal in the lobby restroom. Security assisted too.

## 2017-03-13 NOTE — ED Triage Notes (Signed)
Patient is from home and transported via [facility]. Patient has a caretaker that come 4 days/7days and noticed a change in patient status. Patient has had increased in falls in the last 2 weeks. Also, experiencing weeping in bilateral lower extremities. Patient has completed one round of Doxycycline antibiotics but continues to have drainage with a foul smell. Denies any falls today but has had increased in weakness, pain in lower extremities, and right shoulder pain.

## 2017-03-14 ENCOUNTER — Inpatient Hospital Stay (HOSPITAL_COMMUNITY)
Admission: EM | Admit: 2017-03-14 | Discharge: 2017-03-20 | DRG: 603 | Disposition: A | Discharge status: Skilled Nursing Facility | Payer: Medicare Other | Attending: Internal Medicine | Admitting: Internal Medicine

## 2017-03-14 ENCOUNTER — Inpatient Hospital Stay (HOSPITAL_COMMUNITY)
Admit: 2017-03-14 | Discharge: 2017-03-14 | Disposition: A | Discharge status: Home / Self Care | Payer: Medicare Other | Attending: Internal Medicine | Admitting: Internal Medicine

## 2017-03-14 ENCOUNTER — Inpatient Hospital Stay (HOSPITAL_COMMUNITY): Payer: Medicare Other

## 2017-03-14 DIAGNOSIS — E0800 Diabetes mellitus due to underlying condition with hyperosmolarity without nonketotic hyperglycemic-hyperosmolar coma (NKHHC): Secondary | ICD-10-CM

## 2017-03-14 DIAGNOSIS — M12541 Traumatic arthropathy, right hand: Secondary | ICD-10-CM

## 2017-03-14 DIAGNOSIS — I5030 Unspecified diastolic (congestive) heart failure: Secondary | ICD-10-CM

## 2017-03-14 DIAGNOSIS — L03115 Cellulitis of right lower limb: Secondary | ICD-10-CM | POA: Diagnosis not present

## 2017-03-14 DIAGNOSIS — R0600 Dyspnea, unspecified: Secondary | ICD-10-CM

## 2017-03-14 DIAGNOSIS — F17210 Nicotine dependence, cigarettes, uncomplicated: Secondary | ICD-10-CM | POA: Diagnosis present

## 2017-03-14 DIAGNOSIS — J449 Chronic obstructive pulmonary disease, unspecified: Secondary | ICD-10-CM | POA: Diagnosis present

## 2017-03-14 DIAGNOSIS — Z79899 Other long term (current) drug therapy: Secondary | ICD-10-CM | POA: Diagnosis not present

## 2017-03-14 DIAGNOSIS — I10 Essential (primary) hypertension: Secondary | ICD-10-CM | POA: Diagnosis present

## 2017-03-14 DIAGNOSIS — Z6829 Body mass index (BMI) 29.0-29.9, adult: Secondary | ICD-10-CM | POA: Diagnosis not present

## 2017-03-14 DIAGNOSIS — K219 Gastro-esophageal reflux disease without esophagitis: Secondary | ICD-10-CM | POA: Diagnosis present

## 2017-03-14 DIAGNOSIS — L03116 Cellulitis of left lower limb: Secondary | ICD-10-CM | POA: Diagnosis present

## 2017-03-14 DIAGNOSIS — I739 Peripheral vascular disease, unspecified: Secondary | ICD-10-CM | POA: Diagnosis not present

## 2017-03-14 DIAGNOSIS — L0390 Cellulitis, unspecified: Secondary | ICD-10-CM | POA: Diagnosis not present

## 2017-03-14 DIAGNOSIS — F419 Anxiety disorder, unspecified: Secondary | ICD-10-CM | POA: Diagnosis present

## 2017-03-14 DIAGNOSIS — E785 Hyperlipidemia, unspecified: Secondary | ICD-10-CM | POA: Diagnosis present

## 2017-03-14 DIAGNOSIS — I890 Lymphedema, not elsewhere classified: Secondary | ICD-10-CM | POA: Diagnosis present

## 2017-03-14 DIAGNOSIS — L8990 Pressure ulcer of unspecified site, unspecified stage: Secondary | ICD-10-CM | POA: Insufficient documentation

## 2017-03-14 DIAGNOSIS — R32 Unspecified urinary incontinence: Secondary | ICD-10-CM | POA: Diagnosis present

## 2017-03-14 DIAGNOSIS — R609 Edema, unspecified: Secondary | ICD-10-CM | POA: Diagnosis present

## 2017-03-14 DIAGNOSIS — E11621 Type 2 diabetes mellitus with foot ulcer: Secondary | ICD-10-CM | POA: Diagnosis present

## 2017-03-14 DIAGNOSIS — L03119 Cellulitis of unspecified part of limb: Secondary | ICD-10-CM | POA: Diagnosis present

## 2017-03-14 DIAGNOSIS — E1151 Type 2 diabetes mellitus with diabetic peripheral angiopathy without gangrene: Secondary | ICD-10-CM | POA: Diagnosis present

## 2017-03-14 DIAGNOSIS — L89150 Pressure ulcer of sacral region, unstageable: Secondary | ICD-10-CM | POA: Diagnosis present

## 2017-03-14 DIAGNOSIS — G4730 Sleep apnea, unspecified: Secondary | ICD-10-CM | POA: Diagnosis present

## 2017-03-14 DIAGNOSIS — E119 Type 2 diabetes mellitus without complications: Secondary | ICD-10-CM

## 2017-03-14 DIAGNOSIS — I872 Venous insufficiency (chronic) (peripheral): Secondary | ICD-10-CM | POA: Diagnosis present

## 2017-03-14 DIAGNOSIS — L89300 Pressure ulcer of unspecified buttock, unstageable: Secondary | ICD-10-CM | POA: Diagnosis present

## 2017-03-14 DIAGNOSIS — F329 Major depressive disorder, single episode, unspecified: Secondary | ICD-10-CM | POA: Diagnosis present

## 2017-03-14 DIAGNOSIS — M79641 Pain in right hand: Secondary | ICD-10-CM | POA: Diagnosis present

## 2017-03-14 DIAGNOSIS — Z7982 Long term (current) use of aspirin: Secondary | ICD-10-CM | POA: Diagnosis not present

## 2017-03-14 DIAGNOSIS — L304 Erythema intertrigo: Secondary | ICD-10-CM | POA: Diagnosis present

## 2017-03-14 DIAGNOSIS — L97519 Non-pressure chronic ulcer of other part of right foot with unspecified severity: Secondary | ICD-10-CM | POA: Diagnosis present

## 2017-03-14 LAB — CBC WITH DIFFERENTIAL/PLATELET
BASOS ABS: 0 10*3/uL (ref 0.0–0.1)
Basophils Relative: 0 %
EOS ABS: 0.1 10*3/uL (ref 0.0–0.7)
EOS PCT: 1 %
HCT: 36.1 % — ABNORMAL LOW (ref 39.0–52.0)
Hemoglobin: 11.9 g/dL — ABNORMAL LOW (ref 13.0–17.0)
Lymphocytes Relative: 14 %
Lymphs Abs: 1.7 10*3/uL (ref 0.7–4.0)
MCH: 25 pg — ABNORMAL LOW (ref 26.0–34.0)
MCHC: 33 g/dL (ref 30.0–36.0)
MCV: 75.8 fL — ABNORMAL LOW (ref 78.0–100.0)
Monocytes Absolute: 1.4 10*3/uL — ABNORMAL HIGH (ref 0.1–1.0)
Monocytes Relative: 12 %
Neutro Abs: 9.2 10*3/uL — ABNORMAL HIGH (ref 1.7–7.7)
Neutrophils Relative %: 73 %
PLATELETS: 326 10*3/uL (ref 150–400)
RBC: 4.76 MIL/uL (ref 4.22–5.81)
RDW: 18.2 % — ABNORMAL HIGH (ref 11.5–15.5)
WBC: 12.5 10*3/uL — ABNORMAL HIGH (ref 4.0–10.5)

## 2017-03-14 LAB — COMPREHENSIVE METABOLIC PANEL
ALT: 20 U/L (ref 17–63)
AST: 23 U/L (ref 15–41)
Albumin: 2.3 g/dL — ABNORMAL LOW (ref 3.5–5.0)
Alkaline Phosphatase: 155 U/L — ABNORMAL HIGH (ref 38–126)
Anion gap: 7 (ref 5–15)
BUN: 23 mg/dL — AB (ref 6–20)
CO2: 30 mmol/L (ref 22–32)
CREATININE: 1 mg/dL (ref 0.61–1.24)
Calcium: 8 mg/dL — ABNORMAL LOW (ref 8.9–10.3)
Chloride: 99 mmol/L — ABNORMAL LOW (ref 101–111)
GFR calc non Af Amer: >60 mL/min (ref >60)
Glucose, Bld: 102 mg/dL — ABNORMAL HIGH (ref 65–99)
Potassium: 4 mmol/L (ref 3.5–5.1)
Sodium: 136 mmol/L (ref 135–145)
Total Bilirubin: 0.8 mg/dL (ref 0.3–1.2)
Total Protein: 6.2 g/dL — ABNORMAL LOW (ref 6.5–8.1)

## 2017-03-14 LAB — GLUCOSE, CAPILLARY
GLUCOSE-CAPILLARY: 103 mg/dL — AB (ref 65–99)
Glucose-Capillary: 72 mg/dL (ref 65–99)
Glucose-Capillary: 73 mg/dL (ref 65–99)

## 2017-03-14 LAB — TROPONIN I: Troponin I: <0.03 ng/mL (ref <0.03)

## 2017-03-14 LAB — HEMOGLOBIN A1C
Hgb A1c MFr Bld: 5.7 % — ABNORMAL HIGH (ref 4.8–5.6)
Mean Plasma Glucose: 116.89 mg/dL

## 2017-03-14 LAB — CBG MONITORING, ED: Glucose-Capillary: 64 mg/dL — ABNORMAL LOW (ref 65–99)

## 2017-03-14 LAB — ECHOCARDIOGRAM COMPLETE

## 2017-03-14 MED ORDER — ACETAMINOPHEN 650 MG RE SUPP: 650.0000 mg | Freq: Four times a day (QID) | RECTAL | Status: DC | PRN

## 2017-03-14 MED ORDER — INSULIN ASPART 100 UNIT/ML ~~LOC~~ SOLN
0.0000 [IU] | SUBCUTANEOUS | Status: DC
  Administered 2017-03-15 – 2017-03-18 (×4): 1 [IU] via SUBCUTANEOUS

## 2017-03-14 MED ORDER — VANCOMYCIN HCL IN DEXTROSE 750-5 MG/150ML-% IV SOLN
750.0000 mg | Freq: Two times a day (BID) | INTRAVENOUS | Status: DC
  Administered 2017-03-14 – 2017-03-16 (×2): 750 mg via INTRAVENOUS
  Filled 2017-03-14 (×4): qty 150

## 2017-03-14 MED ORDER — ONDANSETRON HCL 4 MG/2ML IJ SOLN: 4.0000 mg | Freq: Four times a day (QID) | INTRAMUSCULAR | Status: DC | PRN

## 2017-03-14 MED ORDER — FOLIC ACID 1 MG PO TABS
1.0000 mg | ORAL_TABLET | Freq: Every day | ORAL | Status: DC
  Administered 2017-03-14 – 2017-03-20 (×7): 1 mg via ORAL
  Filled 2017-03-14 (×7): qty 1

## 2017-03-14 MED ORDER — COLLAGENASE 250 UNIT/GM EX OINT
TOPICAL_OINTMENT | Freq: Every day | CUTANEOUS | Status: DC
  Administered 2017-03-14 – 2017-03-16 (×3): via TOPICAL
  Administered 2017-03-17: 1 via TOPICAL
  Administered 2017-03-18 – 2017-03-19 (×2): via TOPICAL
  Filled 2017-03-14 (×2): qty 90

## 2017-03-14 MED ORDER — PERFLUTREN LIPID MICROSPHERE
1.0000 mL | INTRAVENOUS | Status: AC | PRN
  Administered 2017-03-14: 3 mL via INTRAVENOUS
  Filled 2017-03-14: qty 10

## 2017-03-14 MED ORDER — ALISKIREN FUMARATE 150 MG PO TABS
150.0000 mg | ORAL_TABLET | ORAL | Status: DC
  Administered 2017-03-14 – 2017-03-20 (×4): 150 mg via ORAL
  Filled 2017-03-14 (×4): qty 1

## 2017-03-14 MED ORDER — VANCOMYCIN HCL IN DEXTROSE 1-5 GM/200ML-% IV SOLN
1000.0000 mg | Freq: Once | INTRAVENOUS | Status: AC
  Administered 2017-03-14: 1000 mg via INTRAVENOUS
  Filled 2017-03-14: qty 200

## 2017-03-14 MED ORDER — PIPERACILLIN-TAZOBACTAM 3.375 G IVPB 30 MIN
3.3750 g | Freq: Once | INTRAVENOUS | Status: AC
  Administered 2017-03-14: 3.375 g via INTRAVENOUS
  Filled 2017-03-14: qty 50

## 2017-03-14 MED ORDER — ENOXAPARIN SODIUM 40 MG/0.4ML ~~LOC~~ SOLN
40.0000 mg | SUBCUTANEOUS | Status: DC
  Administered 2017-03-14 – 2017-03-20 (×6): 40 mg via SUBCUTANEOUS
  Filled 2017-03-14 (×6): qty 0.4

## 2017-03-14 MED ORDER — PANTOPRAZOLE SODIUM 40 MG PO TBEC
40.0000 mg | DELAYED_RELEASE_TABLET | Freq: Every day | ORAL | Status: DC
  Administered 2017-03-14 – 2017-03-20 (×7): 40 mg via ORAL
  Filled 2017-03-14 (×7): qty 1

## 2017-03-14 MED ORDER — ATENOLOL 25 MG PO TABS
25.0000 mg | ORAL_TABLET | Freq: Every day | ORAL | Status: DC
  Administered 2017-03-14 – 2017-03-20 (×7): 25 mg via ORAL
  Filled 2017-03-14 (×7): qty 1

## 2017-03-14 MED ORDER — ONDANSETRON HCL 4 MG PO TABS: 4.0000 mg | ORAL_TABLET | Freq: Four times a day (QID) | ORAL | Status: DC | PRN

## 2017-03-14 MED ORDER — ASPIRIN EC 81 MG PO TBEC
81.0000 mg | DELAYED_RELEASE_TABLET | Freq: Every day | ORAL | Status: DC
  Administered 2017-03-14 – 2017-03-20 (×7): 81 mg via ORAL
  Filled 2017-03-14 (×7): qty 1

## 2017-03-14 MED ORDER — OXYCODONE HCL 5 MG PO TABS
5.0000 mg | ORAL_TABLET | ORAL | Status: DC | PRN
  Administered 2017-03-19 – 2017-03-20 (×3): 5 mg via ORAL
  Filled 2017-03-14 (×4): qty 1

## 2017-03-14 MED ORDER — PIPERACILLIN-TAZOBACTAM 3.375 G IVPB
3.3750 g | Freq: Three times a day (TID) | INTRAVENOUS | Status: DC
  Administered 2017-03-14 – 2017-03-18 (×12): 3.375 g via INTRAVENOUS
  Filled 2017-03-14 (×13): qty 50

## 2017-03-14 MED ORDER — ATORVASTATIN CALCIUM 40 MG PO TABS
80.0000 mg | ORAL_TABLET | Freq: Every day | ORAL | Status: DC
  Administered 2017-03-14 – 2017-03-20 (×7): 80 mg via ORAL
  Filled 2017-03-14 (×7): qty 2

## 2017-03-14 MED ORDER — ACETAMINOPHEN 325 MG PO TABS
650.0000 mg | ORAL_TABLET | Freq: Four times a day (QID) | ORAL | Status: DC | PRN
  Administered 2017-03-16: 650 mg via ORAL
  Filled 2017-03-14: qty 2

## 2017-03-14 MED ORDER — HYDRALAZINE HCL 25 MG PO TABS
25.0000 mg | ORAL_TABLET | Freq: Three times a day (TID) | ORAL | Status: DC
  Administered 2017-03-14 – 2017-03-20 (×19): 25 mg via ORAL
  Filled 2017-03-14 (×18): qty 1

## 2017-03-14 MED ORDER — PERFLUTREN LIPID MICROSPHERE
INTRAVENOUS | Status: AC
  Filled 2017-03-14: qty 10

## 2017-03-14 MED ORDER — LEVALBUTEROL HCL 0.63 MG/3ML IN NEBU: 0.6300 mg | INHALATION_SOLUTION | Freq: Four times a day (QID) | RESPIRATORY_TRACT | Status: DC | PRN

## 2017-03-14 MED ORDER — FUROSEMIDE 10 MG/ML IJ SOLN
40.0000 mg | Freq: Two times a day (BID) | INTRAMUSCULAR | Status: DC
  Administered 2017-03-14 – 2017-03-20 (×13): 40 mg via INTRAVENOUS
  Filled 2017-03-14 (×13): qty 4

## 2017-03-14 NOTE — Plan of Care (Signed)
[name] is a 66 year old male with pmh of DM type 2 with PVD, anxiety, COPD, and GERD; presents with complaints of falls with swelling and erythema legs. Recently treated as an outpatient with doxycycline 3 weeks ago without improvement. Vital signs relatively within normal limits. WBC 12.5, hemoglobin 11.9, BUN 23, creatinine 1, and alkaline phosphatase 155. Patient was started on vancomycin. [facility] called to admit.

## 2017-03-14 NOTE — Progress Notes (Signed)
  Echocardiogram 2D Echocardiogram with definity has been performed.  Darlina Sicilian M 03/14/2017, 3:31 PM

## 2017-03-14 NOTE — ED Notes (Signed)
Call report to [place] at [phone] at [phone].

## 2017-03-14 NOTE — Consult Note (Signed)
[name] wound consult note Reason for Consult:Chronic venous insufficiency and noted history of lymphedema in record from 2016. Patient and paid caregiver report that he has not worn compression or been back to wound center recently.   Wound type:venous ulcer to right posterior leg, present on admission Chronic nonintact lesions to dorsal second toes bilaterally, moist and weeping.   Unstageable pressure injury to sacrum and buttocks, complicated by urinary incontinence Intertriginous dermatitis (incontinence moisture associated skin damage) to inguinal folds and perineal area, partial thickness Pressure Injury POA: Yes/ Measurement: 0.5 cm lesions to toes on bilateral feet Chronic skin changes to bilateral lower legs, dry and scaly, generalized edema Left posterior leg: 6 cm x 4.2 cm x 0.2 cm lesion present, serous weeping Recent decline in ability to care for himself and ambulate.  Has caregiver three days week Sacrum/buttocks 2 cm x 1 cm x 0.2 cm 100% thin slough to wound bed Wound QHU:TMLY and moist Slough to toe wounds and sacral/buttock wound Drainage (amount, consistency, odor) serous weeping Periwound:dry scaly skin, edema, erythema Dressing procedure/placement/frequency:Cleanse bilateral lower legs with soap and water.  Apply Aquacel Ag to wound on left posterior leg Kellie Simmering # [id number]). (Bedside RN to perform this)  Ortho tech to apply Unna boot to bilateral lower legs twice weekly.  Cleanse wounds to buttocks and dorsal toes with NS.  APply Santyl to wound bed.  Cover with ns Moist gauze and tape. Change daily.  FOR MASD to inguinal/perineal skin: Measure and cut length of InterDry Ag+ to fit in skin folds that have skin breakdown Tuck InterDry  Ag+ fabric into skin folds in a single layer, allow for 2 inches of overhang from skin edges to allow for wicking to occur May remove to bathe; dry area thoroughly and then tuck into affected areas again Do not apply any creams or ointments  when using InterDry Ag+ DO NOT THROW AWAY FOR 5 DAYS unless soiled with stool DO NOT [facility] product, this will inactivate the silver in the material  New sheet of Interdry Ag+ should be applied after 5 days of use if patient continues to have skin breakdown   Will not follow at this time.  Please re-consult if needed.  Domenic Moras RN BSN [place] Pager [phone]

## 2017-03-14 NOTE — ED Provider Notes (Signed)
[place] DEPT Provider Note   CSN: [id number] Arrival date & time: 03/13/17  1343  Time seen 05:00 AM   History   Chief Complaint Chief Complaint  Patient presents with  . Fall  . Leg Pain  . Shoulder Pain    HPI Jordan [name] is a 66 y.o. male.  HPI  Patient states he has been having pain in his left knee. Is been bothering him for the past 3-4 weeks. He saw his orthopedist about 3 or 4 weeks ago and got a steroid injection in his knee which helps for about 1-2 weeks however the pain then returned. The orthopedist told him he could only inject his knee with steroids about every 3 or 4 months so he would have to bear with it for now. He states his legs have started swelling up and draining. He called his primary care doctor about 3 weeks ago and he did a 10 day course of doxycycline without improvement. He states he's been cleaning his legs with peroxide daily. He states normally the doxycycline helps however he's had to be admitted before to get IV antibiotics. He denies any fevers, chills, or headache. He states the reason he is falling is because of his left knee hurting and because of the infection any feet. He also states he missed his vitamin B-12 shot 3 weeks ago and that always ask him feel weak.  PCP [name], MD   Past Medical History:  Diagnosis Date  . Anxiety   . Arthritis   . COPD (chronic obstructive pulmonary disease) ([place])   . Depression   . Diabetes mellitus without complication ([place])   . GERD (gastroesophageal reflux disease)   . Hyperlipidemia   . Hypertension   . Migraine   . Obesity   . Sleep apnea    CPAP   . Varicose veins     Patient Active Problem List   Diagnosis Date Noted  . Cellulitis of right leg 10/30/2014  . Chronic venous insufficiency 04/28/2014  . PAD (peripheral artery disease) ([place]) 01/21/2014  . Varicose veins of lower extremities with other complications 57/84/6962  . Diabetes ([place]) 12/31/2013  . Hx of TIA  (transient ischemic attack) and stroke 12/31/2013  . Other and unspecified hyperlipidemia 12/31/2013  . Severe obesity (BMI >= 40) ([place]) 12/31/2013  . Cellulitis of leg, left 09/13/2012  . 272.4 09/13/2012  . Essential hypertension, benign 09/13/2012  . Edema 09/13/2012    Past Surgical History:  Procedure Laterality Date  . ANKLE SURGERY Right    Dr. Marcelino Scot, has pins  . KNEE ARTHROSCOPY     Dr. Noemi Chapel  . right foot surgery,rt great toe callus removal    . TONSILLECTOMY AND ADENOIDECTOMY     age 33       Home Medications    Prior to Admission medications   Medication Sig Start Date End Date Taking? Authorizing Provider  aliskiren (TEKTURNA) 300 MG tablet Take 150 mg by mouth every other day.    Yes [provider]  aspirin 81 MG tablet Take 81 mg by mouth daily.   Yes [provider]  atenolol (TENORMIN) 50 MG tablet Take 50 mg by mouth daily.   Yes [provider]  atorvastatin (LIPITOR) 80 MG tablet Take 80 mg by mouth daily after supper.    Yes [provider]  butalbital-acetaminophen-caffeine (FIORICET, ESGIC) 50-325-40 MG per tablet Take 1 tablet by mouth 2 (two) times daily as needed for pain or headache.  Yes [provider]  furosemide (LASIX) 40 MG tablet Take 40 mg by mouth daily as needed for fluid.    Yes [provider]  LORazepam (ATIVAN) 1 MG tablet Take 1 mg by mouth every 8 (eight) hours as needed for anxiety.   Yes [provider]  methocarbamol (ROBAXIN) 500 MG tablet Take 500 mg by mouth 4 (four) times daily as needed (for muscle spasm.).   Yes [provider]  Multiple Vitamin (MULTIVITAMIN WITH MINERALS) TABS Take 1 tablet by mouth daily.   Yes [provider]  naphazoline-glycerin (CLEAR EYES) 0.012-0.2 % SOLN Place 1 drop into both eyes daily.   Yes [provider]  omeprazole (PRILOSEC) 20 MG capsule Take 20 mg by mouth daily.   Yes [provider]    perphenazine (TRILAFON) 2 MG tablet Take 2 mg by mouth daily after supper.    Yes [provider]  promethazine (PHENERGAN) 25 MG tablet Take 25 mg by mouth every 4 (four) hours as needed for nausea.   Yes [provider]  venlafaxine XR (EFFEXOR-XR) 75 MG 24 hr capsule Take 150 mg by mouth daily after supper. BRAND NAME ONLY   Yes [provider]  atenolol (TENORMIN) 25 MG tablet Take 1 tablet (25 mg total) by mouth daily. 11/04/14   [name], MD  doxycycline (VIBRAMYCIN) 50 MG capsule Take 1 capsule (50 mg total) by mouth 2 (two) times daily. Patient not taking: Reported on 03/14/2017 11/04/14   [name], MD  folic acid (FOLVITE) 1 MG tablet Take 1 tablet (1 mg total) by mouth daily. Patient not taking: Reported on 03/14/2017 11/04/14   [name], MD  hydrALAZINE (APRESOLINE) 25 MG tablet Take 1 tablet (25 mg total) by mouth every 8 (eight) hours. Patient not taking: Reported on 03/14/2017 11/04/14   [name], MD    Family History Family History  Problem Relation Age of Onset  . Hypertension Mother   . Alzheimer's disease Mother   . Stroke Father        x 3  . Heart attack Father   . Congestive Heart Failure Father   . Diabetes Unknown        mat great aunts  . Colon cancer Neg Hx   . Rectal cancer Neg Hx   . Esophageal cancer Neg Hx     Social History Social History  Substance Use Topics  . Smoking status: Current Some Day Smoker    Packs/day: 2.00    Years: 20.00    Types: Cigarettes  . Smokeless tobacco: Never Used  . Alcohol use No  lives at home Lives alone Smokes prn   Allergies   Altace [ramipril]; Codeine; and Naproxen   Review of Systems Review of Systems  All other systems reviewed and are negative.    Physical Exam Updated Vital Signs BP (!) 144/58 (BP Location: Right Arm)   Pulse 69   Temp 97.6 F (36.4 C) (Oral)   Resp 20   Ht 6\' 1"  (1.854 m)   Wt 102.1 kg (225 lb)   SpO2 100%   BMI 29.69 kg/m    Vital signs normal    Physical Exam  Constitutional: He appears well-developed and well-nourished.  HENT:  Head: Normocephalic and atraumatic.  Right Ear: External ear normal.  Left Ear: External ear normal.  Mouth/Throat: Oropharynx is clear and moist.  Eyes: Pupils are equal, round, and reactive to light. Conjunctivae and EOM are normal.  Neck: Normal range of  motion.  Cardiovascular: Normal rate, regular rhythm and normal heart sounds.   Pulmonary/Chest: Effort normal. He has rhonchi.  Abdominal: Soft. Bowel sounds are normal. He exhibits no distension and no mass. There is no tenderness. There is no guarding.  Musculoskeletal: He exhibits edema.  Patient is noted to have diffuse swelling of both lower extremities with redness all the way up his legs almost to his knees bilaterally. He's noted to have diffuse scaling and areas that are draining in his legs. He has some open wounds on the distal tips of several of his toes.  Nursing note and vitals reviewed.          ED Treatments / Results  Labs (all labs ordered are listed, but only abnormal results are displayed) Results for orders placed or performed during the hospital encounter of 03/14/17  Comprehensive metabolic panel  Result Value Ref Range   Sodium 136 135 - 145 mmol/L   Potassium 4.0 3.5 - 5.1 mmol/L   Chloride 99 (L) 101 - 111 mmol/L   CO2 30 22 - 32 mmol/L   Glucose, Bld 102 (H) 65 - 99 mg/dL   BUN 23 (H) 6 - 20 mg/dL   Creatinine, Ser 1.00 0.61 - 1.24 mg/dL   Calcium 8.0 (L) 8.9 - 10.3 mg/dL   Total Protein 6.2 (L) 6.5 - 8.1 g/dL   Albumin 2.3 (L) 3.5 - 5.0 g/dL   AST 23 15 - 41 U/L   ALT 20 17 - 63 U/L   Alkaline Phosphatase 155 (H) 38 - 126 U/L   Total Bilirubin 0.8 0.3 - 1.2 mg/dL   GFR calc non Af Amer >60 >60 mL/min   GFR calc Af Amer >60 >60 mL/min   Anion gap 7 5 - 15  CBC with Differential  Result Value Ref Range   WBC 12.5 (H) 4.0 - 10.5 K/uL   RBC 4.76 4.22 - 5.81 MIL/uL   Hemoglobin  11.9 (L) 13.0 - 17.0 g/dL   HCT 36.1 (L) 39.0 - 52.0 %   MCV 75.8 (L) 78.0 - 100.0 fL   MCH 25.0 (L) 26.0 - 34.0 pg   MCHC 33.0 30.0 - 36.0 g/dL   RDW 18.2 (H) 11.5 - 15.5 %   Platelets 326 150 - 400 K/uL   Neutrophils Relative % 73 %   Neutro Abs 9.2 (H) 1.7 - 7.7 K/uL   Lymphocytes Relative 14 %   Lymphs Abs 1.7 0.7 - 4.0 K/uL   Monocytes Relative 12 %   Monocytes Absolute 1.4 (H) 0.1 - 1.0 K/uL   Eosinophils Relative 1 %   Eosinophils Absolute 0.1 0.0 - 0.7 K/uL   Basophils Relative 0 %   Basophils Absolute 0.0 0.0 - 0.1 K/uL   Laboratory interpretation all normal except leukocytosis, malnutrition    EKG  EKG Interpretation None       Radiology No results found.  Procedures Procedures (including critical care time)  Medications Ordered in ED Medications  vancomycin (VANCOCIN) IVPB 1000 mg/200 mL premix (1,000 mg Intravenous New Bag/Given 03/14/17 0551)     Initial Impression / Assessment and Plan / ED Course  I have reviewed the triage vital signs and the nursing notes.  Pertinent labs & imaging results that were available during my care of the patient were reviewed by me and considered in my medical decision making (see chart for details).     Laboratory testing was ordered and patient was started on IV antibiotics. We discussed admission and he is  agreeable.  Recheck at 6:30 AM when asked patient about his shoulder pain he states he fell over a week ago and hit his right shoulder. He states the pain is improving. He is able to move it and says it's only uncomfortable. He does not feel like he needs to have an x-ray done.  06:44 AM Dr Harvest Forest, hospitalist, will admit  Final Clinical Impressions(s) / ED Diagnoses   Final diagnoses:  Cellulitis of both feet  Cellulitis of both lower extremities    Plan admission  Rolland Porter, MD, Barbette Or, MD 03/14/17 [phone]

## 2017-03-14 NOTE — Progress Notes (Signed)
VASCULAR LAB PRELIMINARY  ARTERIAL  ABI completed: Unable to obtain bilateral ABI's due to patient pain tolerance, extensive open wounds on the calves and ankles, and extensive scaling of the patient's skin. Arterial flow detected in the dorsalis pedis, and posterior tibial arteries bilaterally demonstrated biphasic waveforms.  Unable to obtain bilateral TBI's due to great toe ulcers.   RIGHT    LEFT    PRESSURE WAVEFORM  PRESSURE WAVEFORM  BRACHIAL 164 Triphasic BRACHIAL 167 Triphasic  DP  Biphasic DP  Biphasic  PT  Biphasic PT  Biphasic    RIGHT LEFT  ABI       Legrand Como, RVT 03/14/2017, 11:23 AM

## 2017-03-14 NOTE — H&P (Addendum)
[facility] History and Physical  Jordan [name] YBO:[id number] DOB: 06/18/1951 DOA: 03/14/2017  Referring physician  PCP: [name], MD   Chief Complaint:  Bilateral lower extremity cellulitis*  HPI:  66 year old male with a history of   gastric esophageal reflux, COPD, diabetes, peripheral artery disease, morbid obesity, who presents to the ED via EMS, because of increasing lethargy, increasing falls over the last 2 weeks, worsening bilateral lower extremity edema with weeping and foul-smelling discharge. Patient was recently started on doxycycline but continued to have foul-smelling drainage from both his legs. Patient also complains of left knee pain and right shoulder pain. Patient saw an orthopedic surgeon for his left knee about 3 or 4 weeks ago and corticosteroid injection in the knee. Patient has noticed bilateral lower extremity erythema with worsening edema. Denies any fever, chills,rigors. He has been cleaning his legs with peroxide daily.  ED course BP (!) 144/58 (BP Location: Right Arm)   Pulse 69   Temp 97.6 F (36.4 C) (Oral)   Resp 20   Ht 6\' 1"  (1.854 m)   Wt 102.1 kg (225 lb)   SpO2 100%     Patient received IV vancomycin, no imaging studies were done WBC 12.5, na 136, cr 1.00, hg 11.9,  Admitted for bilateral LE cellulitis .     Review of Systems: negative for the following   a complete review of symptoms was done with pertinent positives in HPI       Past Medical History:  Diagnosis Date  . Anxiety   . Arthritis   . COPD (chronic obstructive pulmonary disease) ([place])   . Depression   . Diabetes mellitus without complication ([place])   . GERD (gastroesophageal reflux disease)   . Hyperlipidemia   . Hypertension   . Migraine   . Obesity   . Sleep apnea    CPAP   . Varicose veins      Past Surgical History:  Procedure Laterality Date  . ANKLE SURGERY Right    Dr. Marcelino Scot, has pins  . KNEE ARTHROSCOPY     Dr. Noemi Chapel  . right foot  surgery,rt great toe callus removal    . TONSILLECTOMY AND ADENOIDECTOMY     age 66      Social History:  reports that he has been smoking Cigarettes.  He has a 40.00 pack-year smoking history. He has never used smokeless tobacco. He reports that he does not drink alcohol or use drugs.    Allergies  Allergen Reactions  . Altace [Ramipril]     Dizziness, migraine, weakness  . Codeine Itching  . Naproxen Itching    Family History  Problem Relation Age of Onset  . Hypertension Mother   . Alzheimer's disease Mother   . Stroke Father        x 3  . Heart attack Father   . Congestive Heart Failure Father   . Diabetes Unknown        mat great aunts  . Colon cancer Neg Hx   . Rectal cancer Neg Hx   . Esophageal cancer Neg Hx         Prior to Admission medications   Medication Sig Start Date End Date Taking? Authorizing Provider  aliskiren (TEKTURNA) 300 MG tablet Take 150 mg by mouth every other day.    Yes [provider]  aspirin 81 MG tablet Take 81 mg by mouth daily.   Yes [provider]  atenolol (TENORMIN) 50 MG tablet Take 50 mg  by mouth daily.   Yes [provider]  atorvastatin (LIPITOR) 80 MG tablet Take 80 mg by mouth daily after supper.    Yes [provider]  butalbital-acetaminophen-caffeine (FIORICET, ESGIC) 50-325-40 MG per tablet Take 1 tablet by mouth 2 (two) times daily as needed for pain or headache.   Yes [provider]  furosemide (LASIX) 40 MG tablet Take 40 mg by mouth daily as needed for fluid.    Yes [provider]  LORazepam (ATIVAN) 1 MG tablet Take 1 mg by mouth every 8 (eight) hours as needed for anxiety.   Yes [provider]  methocarbamol (ROBAXIN) 500 MG tablet Take 500 mg by mouth 4 (four) times daily as needed (for muscle spasm.).   Yes [provider]  Multiple Vitamin (MULTIVITAMIN WITH MINERALS) TABS Take 1 tablet by mouth daily.   Yes [provider]   naphazoline-glycerin (CLEAR EYES) 0.012-0.2 % SOLN Place 1 drop into both eyes daily.   Yes [provider]  omeprazole (PRILOSEC) 20 MG capsule Take 20 mg by mouth daily.   Yes [provider]  perphenazine (TRILAFON) 2 MG tablet Take 2 mg by mouth daily after supper.    Yes [provider]  promethazine (PHENERGAN) 25 MG tablet Take 25 mg by mouth every 4 (four) hours as needed for nausea.   Yes [provider]  venlafaxine XR (EFFEXOR-XR) 75 MG 24 hr capsule Take 150 mg by mouth daily after supper. BRAND NAME ONLY   Yes [provider]  atenolol (TENORMIN) 25 MG tablet Take 1 tablet (25 mg total) by mouth daily. 11/04/14   [name], MD  doxycycline (VIBRAMYCIN) 50 MG capsule Take 1 capsule (50 mg total) by mouth 2 (two) times daily. Patient not taking: Reported on 03/14/2017 11/04/14   [name], MD  folic acid (FOLVITE) 1 MG tablet Take 1 tablet (1 mg total) by mouth daily. Patient not taking: Reported on 03/14/2017 11/04/14   [name], MD  hydrALAZINE (APRESOLINE) 25 MG tablet Take 1 tablet (25 mg total) by mouth every 8 (eight) hours. Patient not taking: Reported on 03/14/2017 11/04/14   [name], MD     Physical Exam: Vitals:   03/13/17 1355 03/13/17 1415 03/14/17 0439  BP: (!) 149/53  (!) 144/58  Pulse: 77  69  Resp: 20  20  Temp: 97.6 F (36.4 C)    TempSrc: Oral    SpO2: 98%  100%  Weight:  102.1 kg (225 lb)   Height:  6\' 1"  (1.854 m)         Vitals:   03/13/17 1355 03/13/17 1415 03/14/17 0439  BP: (!) 149/53  (!) 144/58  Pulse: 77  69  Resp: 20  20  Temp: 97.6 F (36.4 C)    TempSrc: Oral    SpO2: 98%  100%  Weight:  102.1 kg (225 lb)   Height:  6\' 1"  (1.854 m)    Constitutional: NAD, calm, comfortable Eyes: PERRL, lids and conjunctivae normal ENMT: Mucous membranes are moist. Posterior pharynx clear of any exudate or lesions.Normal dentition.  Neck: normal, supple, no masses, no  thyromegaly Respiratory: clear to auscultation bilaterally, no wheezing, no crackles. Normal respiratory effort. No accessory muscle use.  Cardiovascular: Regular rate and rhythm, no murmurs / rubs / gallops. No extremity edema. 2+ pedal pulses. No carotid bruits.  Abdominal: Soft. Bowel sounds are normal. He exhibits no distension and no mass. There is no tenderness. There is no guarding.  Musculoskeletal: He exhibits edema.  Patient is noted to have diffuse swelling of both lower extremities with redness all the way up his legs almost to his knees bilaterally. He's noted to have diffuse scaling and areas that are draining in his legs. He has some open wounds on the distal tips of several of his toes.  Neurologic: CN 2-12 grossly intact. Sensation intact, DTR normal. Strength 5/5 in all 4.  Psychiatric: Normal judgment and insight. Alert and oriented x 3. Normal mood.     Labs on Admission: I have personally reviewed following labs and imaging studies  CBC:  Recent Labs Lab 03/14/17 0521  WBC 12.5*  NEUTROABS 9.2*  HGB 11.9*  HCT 36.1*  MCV 75.8*  PLT 338    Basic Metabolic Panel:  Recent Labs Lab 03/14/17 0521  NA 136  K 4.0  CL 99*  CO2 30  GLUCOSE 102*  BUN 23*  CREATININE 1.00  CALCIUM 8.0*    GFR: Estimated Creatinine Clearance: 91.3 mL/min (by C-G formula based on SCr of 1 mg/dL).  Liver Function Tests:  Recent Labs Lab 03/14/17 0521  AST 23  ALT 20  ALKPHOS 155*  BILITOT 0.8  PROT 6.2*  ALBUMIN 2.3*   No results for input(s): LIPASE, AMYLASE in the last 168 hours. No results for input(s): AMMONIA in the last 168 hours.  Coagulation Profile: No results for input(s): INR, PROTIME in the last 168 hours. No results for input(s): DDIMER in the last 72 hours.  Cardiac Enzymes: No results for input(s): CKTOTAL, CKMB, CKMBINDEX, TROPONINI in the last 168 hours.  BNP (last 3 results) No results for input(s): PROBNP in the last 8760 hours.  HbA1C: No  results for input(s): HGBA1C in the last 72 hours. Lab Results  Component Value Date   HGBA1C 5.0 10/30/2014     CBG: No results for input(s): GLUCAP in the last 168 hours.  Lipid Profile: No results for input(s): CHOL, HDL, LDLCALC, TRIG, CHOLHDL, LDLDIRECT in the last 72 hours.  Thyroid Function Tests: No results for input(s): TSH, T4TOTAL, FREET4, T3FREE, THYROIDAB in the last 72 hours.  Anemia Panel: No results for input(s): VITAMINB12, FOLATE, FERRITIN, TIBC, IRON, RETICCTPCT in the last 72 hours.  Urine analysis: No results found for: COLORURINE, APPEARANCEUR, LABSPEC, PHURINE, GLUCOSEU, HGBUR, BILIRUBINUR, KETONESUR, PROTEINUR, UROBILINOGEN, NITRITE, LEUKOCYTESUR  Sepsis Labs: @LABRCNTIP (procalcitonin:4,lacticidven:4) )No results found for this or any previous visit (from the past 240 hour(s)).       Radiological Exams on Admission: No results found. No results found.    EKG: Independently reviewed.   Assessment/Plan Principal Problem:   Active Problems:   Essential hypertension, benign   Edema   Diabetes (HCC)   Severe obesity (BMI >= 40) (HCC)   PAD (peripheral artery disease) (HCC)   Chronic venous insufficiency   Cellulitis of right leg   Cellulitis, leg    Cellulitis of left, right leg;   Started on IV vancomycin and zosyn (for broad-spectrum coverage due to diabetes, chronic venous insufficiency), cellulitis improving.  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  Diabetes mellitus,- check CBG,started SSI check HGBA1C     PAD (peripheral artery disease) - Continue aspirin, check ABI   Hyperlipidemia  - Continue statin       DVT prophylaxis: lovenox      Code Status Orders   Full code         consults called: woc   Family Communication: Admission, patients condition and plan of care including tests being ordered have been discussed with the patient  who indicates understanding and agree with the plan and Code Status  Admission status: inpatient    Disposition plan: Further plan will depend as patient's clinical course evolves and further radiologic and laboratory data become available. Likely home when stable   At the time of admission, it appears that the appropriate admission status for this patient is INPATIENT .Thisis judged to be reasonable and necessary in order to provide the required intensity of service to ensure the patient's safetygiven thepresenting symptoms, physical exam findings, and initial radiographic and laboratory data in the context of their chronic comorbidities.   Reyne Dumas MD [facility] Pager [phone]  If 7PM-7AM, please contact night-coverage [email or web address] Password [facility]  03/14/2017, 8:49 AM

## 2017-03-14 NOTE — Progress Notes (Signed)
Pharmacy Antibiotic Note  Jordan [name] is a 66 y.o. male admitted on 03/14/2017 with cellulitis.  Pharmacy has been consulted for Vanc/Zosyn dosing.  Plan: Vancomycin 2g x 1 then 750mg  IV every 12 hours.  Goal trough 15-20 mcg/mL. Zosyn 3.375g IV q8h (4 hour infusion).  Height: 6\' 1"  (185.4 cm) Weight: 225 lb (102.1 kg) IBW/kg (Calculated) : 79.9  Temp (24hrs), Avg:97.6 F (36.4 C), Min:97.6 F (36.4 C), Max:97.6 F (36.4 C)   Recent Labs Lab 03/14/17 0521  WBC 12.5*  CREATININE 1.00    Estimated Creatinine Clearance: 91.3 mL/min (by C-G formula based on SCr of 1 mg/dL).    Allergies  Allergen Reactions  . Altace [Ramipril]     Dizziness, migraine, weakness  . Codeine Itching  . Naproxen Itching     Thank you for allowing pharmacy to be a part of this patient's care.   Adrian Saran, PharmD, BCPS Pager [phone] 03/14/2017 9:03 AM

## 2017-03-15 DIAGNOSIS — L03116 Cellulitis of left lower limb: Principal | ICD-10-CM

## 2017-03-15 DIAGNOSIS — L03115 Cellulitis of right lower limb: Secondary | ICD-10-CM

## 2017-03-15 DIAGNOSIS — I10 Essential (primary) hypertension: Secondary | ICD-10-CM

## 2017-03-15 DIAGNOSIS — L8990 Pressure ulcer of unspecified site, unspecified stage: Secondary | ICD-10-CM | POA: Insufficient documentation

## 2017-03-15 DIAGNOSIS — I872 Venous insufficiency (chronic) (peripheral): Secondary | ICD-10-CM

## 2017-03-15 LAB — COMPREHENSIVE METABOLIC PANEL
ALBUMIN: 1.7 g/dL — AB (ref 3.5–5.0)
ALK PHOS: 104 U/L (ref 38–126)
ALT: 15 U/L — ABNORMAL LOW (ref 17–63)
ANION GAP: 9 (ref 5–15)
AST: 21 U/L (ref 15–41)
BILIRUBIN TOTAL: 1 mg/dL (ref 0.3–1.2)
BUN: 21 mg/dL — ABNORMAL HIGH (ref 6–20)
CO2: 25 mmol/L (ref 22–32)
Calcium: 7.2 mg/dL — ABNORMAL LOW (ref 8.9–10.3)
Chloride: 102 mmol/L (ref 101–111)
Creatinine, Ser: 1.09 mg/dL (ref 0.61–1.24)
GLUCOSE: 93 mg/dL (ref 65–99)
POTASSIUM: 3.6 mmol/L (ref 3.5–5.1)
Sodium: 136 mmol/L (ref 135–145)
TOTAL PROTEIN: 4.8 g/dL — AB (ref 6.5–8.1)

## 2017-03-15 LAB — GLUCOSE, CAPILLARY
GLUCOSE-CAPILLARY: 135 mg/dL — AB (ref 65–99)
GLUCOSE-CAPILLARY: 83 mg/dL (ref 65–99)
Glucose-Capillary: 62 mg/dL — ABNORMAL LOW (ref 65–99)
Glucose-Capillary: 92 mg/dL (ref 65–99)
Glucose-Capillary: 93 mg/dL (ref 65–99)
Glucose-Capillary: 92 mg/dL (ref 65–99)
Glucose-Capillary: 84 mg/dL (ref 65–99)

## 2017-03-15 LAB — CBC
HEMATOCRIT: 32.2 % — AB (ref 39.0–52.0)
HEMOGLOBIN: 10.5 g/dL — AB (ref 13.0–17.0)
MCH: 24.4 pg — AB (ref 26.0–34.0)
MCHC: 32.6 g/dL (ref 30.0–36.0)
MCV: 74.9 fL — ABNORMAL LOW (ref 78.0–100.0)
Platelets: 231 10*3/uL (ref 150–400)
RBC: 4.3 MIL/uL (ref 4.22–5.81)
RDW: 18.6 % — ABNORMAL HIGH (ref 11.5–15.5)
WBC: 9.7 10*3/uL (ref 4.0–10.5)

## 2017-03-15 LAB — HIV ANTIBODY (ROUTINE TESTING W REFLEX): HIV SCREEN 4TH GENERATION: NONREACTIVE

## 2017-03-15 NOTE — Progress Notes (Signed)
Hypoglycemic Event  CBG: 62  Treatment: 15 GM carbohydrate snack  Symptoms: None  Follow-up CBG: Time 0521 CBG Result 92  Possible Reasons for Event: Unknown  Comments/MD notified: Nurse driven hypoglycemic protocol initiated     Wells Gerdeman, Sherryll Burger

## 2017-03-15 NOTE — Progress Notes (Signed)
PROGRESS NOTE    Jordan [name]  WFU:[id number] DOB: [birth year] DOA: 03/14/2017 PCP: [name], MD    Brief Narrative:  66 year old male with a history of   gastric esophageal reflux, COPD, diabetes, peripheral artery disease, morbid obesity, who presents to the ED via EMS, because of increasing lethargy, increasing falls over the last 2 weeks, worsening bilateral lower extremity edema with weeping and foul-smelling discharge. Patient was recently started on doxycycline but continued to have foul-smelling drainage from both his legs. Patient also complains of left knee pain and right shoulder pain. Patient saw an orthopedic surgeon for his left knee about 3 or 4 weeks ago and corticosteroid injection in the knee. Patient has noticed bilateral lower extremity erythema with worsening edema. Denies any fever, chills,rigors. He has been cleaning his legs with peroxide daily.  Assessment & Plan:   Principal Problem:   Cellulitis of leg, left Active Problems:   Essential hypertension, benign   Edema   Diabetes (HCC)   Severe obesity (BMI >= 40) (HCC)   PAD (peripheral artery disease) (HCC)   Chronic venous insufficiency   Cellulitis of right leg   Cellulitis, leg   Pressure injury of skin  Cellulitis of left, right leg;   -Started on IV vancomycin and zosyn (for broad-spectrum coverage due to diabetes, chronic venous insufficiency) -Patient to keep leg elevated while at rest -Continue on scheduled lasix -ECHO reviewed with normal LVEF, no wall motion abnormality. -Patient would benefit from lymphedema management and outpatient follow-up -Patient is known to the vascular surgery service. Recommend close follow-up -Wound ostomy consulted. Appreciate input    Essential hypertension, benign Continue  hydralazine , atenolol, in addition to his tekturna and lasix.  -Blood pressure currently stable and controlled reviewed   Diabetes mellitus,- check CBG,started SSI -Hemoglobin  A1c 5.7, suggesting good control   PAD (peripheral artery disease) - Continue aspirin, check ABI  -Patient known to vascular surgery service. Will have patient follow-up closely  Hyperlipidemia  - Continue statin  -Appear stable this time  DVT prophylaxis: Lovenox subQ Code Status: Full Family Communication: Pt in room, family not at bedside Disposition Plan: uncertain at this time  Consultants:   Somers  Procedures:     Antimicrobials: Anti-infectives    Start     Dose/Rate Route Frequency Ordered Stop   03/15/17 0000  vancomycin (VANCOCIN) IVPB 750 mg/150 ml premix     750 mg 150 mL/hr over 60 Minutes Intravenous Every 12 hours 03/14/17 0904     03/14/17 1800  piperacillin-tazobactam (ZOSYN) IVPB 3.375 g     3.375 g 12.5 mL/hr over 240 Minutes Intravenous Every 8 hours 03/14/17 0904     03/14/17 0845  piperacillin-tazobactam (ZOSYN) IVPB 3.375 g     3.375 g 100 mL/hr over 30 Minutes Intravenous  Once 03/14/17 0842 03/14/17 1141   03/14/17 0845  vancomycin (VANCOCIN) IVPB 1000 mg/200 mL premix     1,000 mg 200 mL/hr over 60 Minutes Intravenous  Once 03/14/17 0842 03/14/17 1212   03/14/17 0530  vancomycin (VANCOCIN) IVPB 1000 mg/200 mL premix     1,000 mg 200 mL/hr over 60 Minutes Intravenous  Once 03/14/17 0521 03/14/17 0654       Subjective: Reports feeling better today  Objective: Vitals:   03/14/17 2009 03/14/17 2335 03/15/17 0425 03/15/17 1436  BP: (!) 107/41 (!) 121/47 (!) 113/44 (!) 112/42  Pulse: 69  62 63  Resp: 16  20 16   Temp: 99 F (37.2 C)  98.3 F (  36.8 C) 98 F (36.7 C)  TempSrc: Oral  Oral Oral  SpO2: 99%  97% 96%  Weight:      Height:        Intake/Output Summary (Last 24 hours) at 03/15/17 1439 Last data filed at 03/15/17 1200  Gross per 24 hour  Intake             1090 ml  Output             2880 ml  Net            -1790 ml   Filed Weights   03/13/17 1415  Weight: 102.1 kg (225 lb)    Examination:  General exam:  Appears calm and comfortable  Respiratory system: Clear to auscultation. Respiratory effort normal. Cardiovascular system: S1 & S2 heard, RRR.  Gastrointestinal system: Abdomen is nondistended, soft and nontender. No organomegaly or masses felt. Normal bowel sounds heard. Central nervous system: Alert and oriented. No focal neurological deficits. Extremities: Symmetric 5 x 5 power. Skin: No rashes, LE wounds covered by unaboots bilaterally Psychiatry: Judgement and insight appear normal. Mood & affect appropriate.   Data Reviewed: I have personally reviewed following labs and imaging studies  CBC:  Recent Labs Lab 03/14/17 0521 03/15/17 0601  WBC 12.5* 9.7  NEUTROABS 9.2*  --   HGB 11.9* 10.5*  HCT 36.1* 32.2*  MCV 75.8* 74.9*  PLT 326 093   Basic Metabolic Panel:  Recent Labs Lab 03/14/17 0521 03/15/17 0601  NA 136 136  K 4.0 3.6  CL 99* 102  CO2 30 25  GLUCOSE 102* 93  BUN 23* 21*  CREATININE 1.00 1.09  CALCIUM 8.0* 7.2*   GFR: Estimated Creatinine Clearance: 83.7 mL/min (by C-G formula based on SCr of 1.09 mg/dL). Liver Function Tests:  Recent Labs Lab 03/14/17 0521 03/15/17 0601  AST 23 21  ALT 20 15*  ALKPHOS 155* 104  BILITOT 0.8 1.0  PROT 6.2* 4.8*  ALBUMIN 2.3* 1.7*   No results for input(s): LIPASE, AMYLASE in the last 168 hours. No results for input(s): AMMONIA in the last 168 hours. Coagulation Profile: No results for input(s): INR, PROTIME in the last 168 hours. Cardiac Enzymes:  Recent Labs Lab 03/14/17 1058 03/14/17 1457 03/14/17 2024  TROPONINI <0.03 <0.03 <0.03   BNP (last 3 results) No results for input(s): PROBNP in the last 8760 hours. HbA1C:  Recent Labs  03/14/17 1058  HGBA1C 5.7*   CBG:  Recent Labs Lab 03/15/17 0011 03/15/17 0422 03/15/17 0519 03/15/17 0837 03/15/17 1156  GLUCAP 83 62* 92 135* 93   Lipid Profile: No results for input(s): CHOL, HDL, LDLCALC, TRIG, CHOLHDL, LDLDIRECT in the last 72  hours. Thyroid Function Tests: No results for input(s): TSH, T4TOTAL, FREET4, T3FREE, THYROIDAB in the last 72 hours. Anemia Panel: No results for input(s): VITAMINB12, FOLATE, FERRITIN, TIBC, IRON, RETICCTPCT in the last 72 hours. Sepsis Labs: No results for input(s): PROCALCITON, LATICACIDVEN in the last 168 hours.  No results found for this or any previous visit (from the past 240 hour(s)).   Radiology Studies: No results found.  Scheduled Meds: . aliskiren  150 mg Oral QODAY  . aspirin EC  81 mg Oral Daily  . atenolol  25 mg Oral Daily  . atorvastatin  80 mg Oral QPC supper  . collagenase   Topical Daily  . enoxaparin (LOVENOX) injection  40 mg Subcutaneous Q24H  . folic acid  1 mg Oral Daily  . furosemide  40 mg  Intravenous Q12H  . hydrALAZINE  25 mg Oral Q8H  . insulin aspart  0-9 Units Subcutaneous Q4H  . pantoprazole  40 mg Oral Daily   Continuous Infusions: . piperacillin-tazobactam (ZOSYN)  IV 3.375 g (03/15/17 0930)  . vancomycin Stopped (03/15/17 0035)     LOS: 1 day   Avenir Lozinski, Orpah Melter, MD [facility] Pager [phone]  If 7PM-7AM, please contact night-coverage [email or web address] Password [facility] 03/15/2017, 2:39 PM

## 2017-03-16 ENCOUNTER — Encounter (HOSPITAL_COMMUNITY): Payer: Self-pay

## 2017-03-16 LAB — CBC
HEMATOCRIT: 34.3 % — AB (ref 39.0–52.0)
Hemoglobin: 11.3 g/dL — ABNORMAL LOW (ref 13.0–17.0)
MCH: 25 pg — AB (ref 26.0–34.0)
MCHC: 32.9 g/dL (ref 30.0–36.0)
MCV: 75.9 fL — AB (ref 78.0–100.0)
Platelets: 267 10*3/uL (ref 150–400)
RBC: 4.52 MIL/uL (ref 4.22–5.81)
RDW: 19 % — AB (ref 11.5–15.5)
WBC: 11.5 10*3/uL — ABNORMAL HIGH (ref 4.0–10.5)

## 2017-03-16 LAB — BASIC METABOLIC PANEL
Anion gap: 11 (ref 5–15)
BUN: 23 mg/dL — AB (ref 6–20)
CHLORIDE: 99 mmol/L — AB (ref 101–111)
CO2: 28 mmol/L (ref 22–32)
Calcium: 7.4 mg/dL — ABNORMAL LOW (ref 8.9–10.3)
Creatinine, Ser: 1.35 mg/dL — ABNORMAL HIGH (ref 0.61–1.24)
GFR calc Af Amer: >60 mL/min (ref >60)
GFR calc non Af Amer: 53 mL/min — ABNORMAL LOW (ref >60)
GLUCOSE: 84 mg/dL (ref 65–99)
POTASSIUM: 4.6 mmol/L (ref 3.5–5.1)
Sodium: 138 mmol/L (ref 135–145)

## 2017-03-16 LAB — GLUCOSE, CAPILLARY
Glucose-Capillary: 103 mg/dL — ABNORMAL HIGH (ref 65–99)
Glucose-Capillary: 106 mg/dL — ABNORMAL HIGH (ref 65–99)
Glucose-Capillary: 125 mg/dL — ABNORMAL HIGH (ref 65–99)
Glucose-Capillary: 85 mg/dL (ref 65–99)
Glucose-Capillary: 97 mg/dL (ref 65–99)

## 2017-03-16 MED ORDER — DOXYCYCLINE HYCLATE 100 MG IV SOLR
100.0000 mg | Freq: Two times a day (BID) | INTRAVENOUS | Status: DC
  Administered 2017-03-16 – 2017-03-18 (×5): 100 mg via INTRAVENOUS
  Filled 2017-03-16 (×5): qty 100

## 2017-03-16 NOTE — Clinical Social Work Note (Cosign Needed)
Clinical Social Work Assessment  Patient Details  Name: Jordan [name] MRN: [id number] Date of Birth: 10/11/1950  Date of referral:  03/16/17               Reason for consult:  Facility Placement                Permission sought to share information with:  Facility Contact Representative Permission granted to share information::  Yes, Verbal Permission Granted  Name::        Agency::   [facility] referrals  Relationship::      Contact Information:     Housing/Transportation Living arrangements for the past 2 months:  Single Family Home Source of Information:  Patient Patient Interpreter Needed:  None Criminal Activity/Legal Involvement Pertinent to Current Situation/Hospitalization:    Significant Relationships:  Other Family Members Lives with:  Self Do you feel safe going back to the place where you live?  No Need for family participation in patient care:  Yes (Comment)  Care giving concerns:  Patient admitted to hospital who presents to the ED via EMS, because of increasing lethargy, increasing falls over the last 2 weeks, worsening bilateral lower extremity edema with weeping and foul-smelling discharge. Patient was recently started on doxycycline but continued to have foul-smelling drainage from both his legs. Patient also complains of left knee pain and right shoulder pain. Patient saw an orthopedic surgeon for his left knee about 3 or 4 weeks ago and corticosteroid injection in the knee. Patient lives alone at home and has passive support with family and needing 24 hour care/wound care at facility. Patient is agreeable to SNF at discharge.   Social Worker assessment / plan: CSW intern completed assessment and consult for discharge planning/SNF recommendation.  Social work intern met with patient via bedside to discuss skilled nursing placement upon discharge. Patient was very adamant about wanting [facility] skilled nursing facility. CSW intern educated  patient that we would send his information to [facility] as well as other skilled nursing facilities in the area. Patient told writer that he had a few cousins and an aunt as support systems to help him.   Plan: SNF work up completed. Will follow up with bed offers. Will DC to SNF at time of discharge. Patient requires EMS transport due to extensive wound care.  Employment status:  Retired Insurance information:  Managed Medicare PT Recommendations:  Skilled Nursing Facility Information / Referral to community resources:  Skilled Nursing Facility  Patient/Family's Response to care:  Patient was receptive and appreciative of social work intern and the information given to him.   Patient/Family's Understanding of and Emotional Response to Diagnosis, Current Treatment, and Prognosis:  Patient receptive to CSW intervention and recommendations for SNF.  Patient proactive and motivated to completing rehab in effort to increase independence after placement. Patient aware of functional abilities and needing safe DC plan.   Emotional Assessment Appearance:  Appears stated age Attitude/Demeanor/Rapport:    Affect (typically observed):  Accepting, Calm, Appropriate Orientation:  Oriented to Self, Oriented to Place, Oriented to  Time, Oriented to Situation Alcohol / Substance use:    Psych involvement (Current and /or in the community):  No (Comment)  Discharge Needs  Concerns to be addressed:    Care coordination Readmission within the last 30 days:  No Current discharge risk:  None Barriers to Discharge:  No Barriers Identified, Continued Medical work up.    R , Student-Social Work 03/16/2017, 1:00 PM  

## 2017-03-16 NOTE — Evaluation (Signed)
Physical Therapy Evaluation Patient Details Name: Jordan [name] MRN: [id number] DOB: 05/28/1951 Today's Date: 03/16/2017   History of Present Illness  66 year old male with a history of   gastric esophageal reflux, COPD, diabetes, peripheral artery disease, morbid obesity, who presents to the ED via EMS, because of increasing lethargy, increasing falls over the last 2 weeks, worsening bilateral lower extremity edema with weeping and foul-smelling discharge. Continues to have foul-smelling drainage from both his legs  Clinical Impression  The patient is below his functional baseline of independence with a cane.. Pt admitted with above diagnosis. Pt currently with functional limitations due to the deficits listed below (see PT Problem List). Pt will benefit from skilled PT to increase their independence and safety with mobility to allow discharge to the venue listed below.       Follow Up Recommendations SNF    Equipment Recommendations  None recommended by PT    Recommendations for Other Services       Precautions / Restrictions Precautions Precautions: Fall      Mobility  Bed Mobility Overal bed mobility: Needs Assistance Bed Mobility: Supine to Sit     Supine to sit: Mod assist;HOB elevated     General bed mobility comments: assist with legs, gentle support  of the trunk  Transfers Overall transfer level: Needs assistance Equipment used: Rolling walker (2 wheeled) Transfers: Sit to/from Stand Sit to Stand: Mod assist;From elevated surface         General transfer comment: steady assist from raised bed, extra time to rise  Ambulation/Gait Ambulation/Gait assistance: Mod assist;+2 safety/equipment Ambulation Distance (Feet): 8 Feet Assistive device: Rolling walker (2 wheeled) Gait Pattern/deviations: Step-to pattern     General Gait Details: very slow pace, cues to keep RW on floor, push through arms, 1 mild balance loss requiring steady assist.  Stairs             Wheelchair Mobility    Modified Rankin (Stroke Patients Only)       Balance Overall balance assessment: Needs assistance;History of Falls Sitting-balance support: Feet supported;Bilateral upper extremity supported Sitting balance-Leahy Scale: Good     Standing balance support: During functional activity;Bilateral upper extremity supported Standing balance-Leahy Scale: Poor                               Pertinent Vitals/Pain Pain Assessment: Faces Faces Pain Scale: Hurts little more Pain Location: both legs with WB Pain Descriptors / Indicators: Discomfort;Tightness Pain Intervention(s): Monitored during session    Home Living Family/patient expects to be discharged to:: Private residence Living Arrangements: Non-relatives/Friends Available Help at Discharge: Friend(s) Type of Home: House Home Access: Stairs to enter Entrance Stairs-Rails: Psychiatric nurse of Steps: 7 Home Layout: Two level Home Equipment: Environmental consultant - 2 wheels;Cane - single point      Prior Function Level of Independence: Independent with assistive device(s)         Comments: still drives     Hand Dominance        Extremity/Trunk Assessment   Upper Extremity Assessment Upper Extremity Assessment: Generalized weakness    Lower Extremity Assessment Lower Extremity Assessment: RLE deficits/detail;LLE deficits/detail RLE Deficits / Details: Unnawraps in place, able to bear weight LLE Deficits / Details: same as right    Cervical / Trunk Assessment Cervical / Trunk Assessment: Normal  Communication      Cognition Arousal/Alertness: Awake/alert Behavior During Therapy: WFL for tasks assessed/performed Overall Cognitive Status: Impaired/Different  from baseline Area of Impairment: Orientation                 Orientation Level: Time             General Comments: date when he arrived to hospital- "a week'      General Comments       Exercises     Assessment/Plan    PT Assessment Patient needs continued PT services  PT Problem List Decreased strength;Decreased activity tolerance;Decreased mobility;Decreased knowledge of precautions;Decreased safety awareness;Decreased knowledge of use of DME;Decreased skin integrity;Pain       PT Treatment Interventions DME instruction;Gait training;Stair training;Functional mobility training;Therapeutic activities;Therapeutic exercise;Patient/family education    PT Goals (Current goals can be found in the Care Plan section)  Acute Rehab PT Goals Patient Stated Goal: i want to get these legs healed PT Goal Formulation: With patient Time For Goal Achievement: 03/30/17 Potential to Achieve Goals: Good    Frequency Min 3X/week   Barriers to discharge Decreased caregiver support      Co-evaluation               AM-PAC PT "6 Clicks" Daily Activity  Outcome Measure Difficulty turning over in bed (including adjusting bedclothes, sheets and blankets)?: Unable Difficulty moving from lying on back to sitting on the side of the bed? : Unable Difficulty sitting down on and standing up from a chair with arms (e.g., wheelchair, bedside commode, etc,.)?: Unable Help needed moving to and from a bed to chair (including a wheelchair)?: Total Help needed walking in hospital room?: Total Help needed climbing 3-5 steps with a railing? : Total 6 Click Score: 6    End of Session   Activity Tolerance: Patient tolerated treatment well Patient left: in chair;with call bell/phone within reach;with chair alarm set Nurse Communication: Mobility status PT Visit Diagnosis: Unsteadiness on feet (R26.81);Pain Pain - Right/Left: Left Pain - part of body: Leg    Time: 0901-0926 PT Time Calculation (min) (ACUTE ONLY): 25 min   Charges:   PT Evaluation $PT Eval Low Complexity: 1 Low PT Treatments $Gait Training: 8-22 mins   PT G CodesTresa Endo PT [phone]   Claretha Cooper 03/16/2017, 9:47 AM

## 2017-03-16 NOTE — NC FL2 (Addendum)
[place] LEVEL OF CARE SCREENING TOOL     IDENTIFICATION  Patient Name: Jordan [name] Birthdate: [birth year] Sex: male Admission Date (Current Location): 03/14/2017  [facility] [place] Number:  Herbalist and Address:  [facility],  [place], [place]      Provider Number: [id number]  Attending Physician Name and Address:  Donne Hazel, MD  Relative Name and Phone Number:  Crescencio Jozwiak    [phone] [facility])    Current Level of Care: Hospital Recommended Level of Care: [place] Prior Approval Number:    Date Approved/Denied:   PASRR Number:   [id number]   Discharge Plan: SNF    Current Diagnoses: Patient Active Problem List   Diagnosis Date Noted  . Pressure injury of skin 03/15/2017  . Cellulitis, leg 03/14/2017  . Cellulitis of both feet   . Cellulitis of right leg 10/30/2014  . Chronic venous insufficiency 04/28/2014  . PAD (peripheral artery disease) ([place]) 01/21/2014  . Varicose veins of lower extremities with other complications 51/88/4166  . Diabetes ([place]) 12/31/2013  . Hx of TIA (transient ischemic attack) and stroke 12/31/2013  . Other and unspecified hyperlipidemia 12/31/2013  . Severe obesity (BMI >= 40) ([place]) 12/31/2013  . Cellulitis of leg, left 09/13/2012  . 272.4 09/13/2012  . Essential hypertension, benign 09/13/2012  . Edema 09/13/2012    Orientation RESPIRATION BLADDER Height & Weight     Self, Time, Situation, Place  Normal Continent Weight: 225 lb (102.1 kg) Height:  6\' 1"  (185.4 cm)  BEHAVIORAL SYMPTOMS/MOOD NEUROLOGICAL BOWEL NUTRITION STATUS      Continent Diet (Carb modified )  AMBULATORY STATUS COMMUNICATION OF NEEDS Skin   Limited Assist Verbally Skin abrasions, Other (Comment) (worsening bilateral lower extremity edema with weeping and foul-smelling discharge; Stage 2 buttocks )                       Personal Care Assistance Level of Assistance   Bathing, Feeding, Dressing Bathing Assistance: Limited assistance Feeding assistance: Independent Dressing Assistance: Limited assistance     Functional Limitations Info             SPECIAL CARE FACTORS FREQUENCY  PT (By licensed PT), OT (By licensed OT)     PT Frequency: 5 OT Frequency: 5            Contractures      Additional Factors Info  Code Status, Allergies Code Status Info: Full Code              Current Medications (03/16/2017):  This is the current hospital active medication list Current Facility-Administered Medications  Medication Dose Route Frequency Provider Last Rate Last Dose  . acetaminophen (TYLENOL) tablet 650 mg  650 mg Oral Q6H PRN Reyne Dumas, MD       Or  . acetaminophen (TYLENOL) suppository 650 mg  650 mg Rectal Q6H PRN Reyne Dumas, MD      . aliskiren (TEKTURNA) tablet 150 mg  150 mg Oral Truett Perna, MD   150 mg at 03/16/17 1038  . aspirin EC tablet 81 mg  81 mg Oral Daily Reyne Dumas, MD   81 mg at 03/16/17 1038  . atenolol (TENORMIN) tablet 25 mg  25 mg Oral Daily Reyne Dumas, MD   25 mg at 03/16/17 1038  . atorvastatin (LIPITOR) tablet 80 mg  80 mg Oral QPC supper Reyne Dumas, MD   80 mg at 03/15/17  1923  . collagenase (SANTYL) ointment   Topical Daily Reyne Dumas, MD      . doxycycline (VIBRAMYCIN) 100 mg in dextrose 5 % 250 mL IVPB  100 mg Intravenous Q12H Donne Hazel, MD 125 mL/hr at 03/16/17 1225 100 mg at 03/16/17 1225  . enoxaparin (LOVENOX) injection 40 mg  40 mg Subcutaneous Q24H Reyne Dumas, MD   40 mg at 03/16/17 1225  . folic acid (FOLVITE) tablet 1 mg  1 mg Oral Daily Abrol, Ascencion Dike, MD   1 mg at 03/16/17 1038  . furosemide (LASIX) injection 40 mg  40 mg Intravenous Q12H Reyne Dumas, MD   40 mg at 03/16/17 1038  . hydrALAZINE (APRESOLINE) tablet 25 mg  25 mg Oral Q8H Reyne Dumas, MD   25 mg at 03/16/17 1655  . insulin aspart (novoLOG) injection 0-9 Units  0-9 Units Subcutaneous Q4H Reyne Dumas,  MD   1 Units at 03/15/17 0800  . levalbuterol (XOPENEX) nebulizer solution 0.63 mg  0.63 mg Nebulization Q6H PRN Reyne Dumas, MD      . ondansetron (ZOFRAN) tablet 4 mg  4 mg Oral Q6H PRN Reyne Dumas, MD       Or  . ondansetron (ZOFRAN) injection 4 mg  4 mg Intravenous Q6H PRN Abrol, Ascencion Dike, MD      . oxyCODONE (Oxy IR/ROXICODONE) immediate release tablet 5 mg  5 mg Oral Q4H PRN Reyne Dumas, MD      . pantoprazole (PROTONIX) EC tablet 40 mg  40 mg Oral Daily Reyne Dumas, MD   40 mg at 03/16/17 1038  . piperacillin-tazobactam (ZOSYN) IVPB 3.375 g  3.375 g Intravenous Q8H Adrian Saran, [place]   Stopped at 03/16/17 0710     Discharge Medications: Please see discharge summary for a list of discharge medications.  Relevant Imaging Results:  Relevant Lab Results:   Additional Information SS#: [id number]  Nickie Retort Work [phone]

## 2017-03-16 NOTE — Progress Notes (Signed)
PROGRESS NOTE    Jordan [name]  YJE:[id number] DOB: Sep 17, 1950 DOA: 03/14/2017 PCP: [name], MD    Brief Narrative:  66 year old male with a history of   gastric esophageal reflux, COPD, diabetes, peripheral artery disease, morbid obesity, who presents to the ED via EMS, because of increasing lethargy, increasing falls over the last 2 weeks, worsening bilateral lower extremity edema with weeping and foul-smelling discharge. Patient was recently started on doxycycline but continued to have foul-smelling drainage from both his legs. Patient also complains of left knee pain and right shoulder pain. Patient saw an orthopedic surgeon for his left knee about 3 or 4 weeks ago and corticosteroid injection in the knee. Patient has noticed bilateral lower extremity erythema with worsening edema. Denies any fever, chills,rigors. He has been cleaning his legs with peroxide daily.  Assessment & Plan:   Principal Problem:   Cellulitis of leg, left Active Problems:   Essential hypertension, benign   Edema   Diabetes (HCC)   Severe obesity (BMI >= 40) (HCC)   PAD (peripheral artery disease) (HCC)   Chronic venous insufficiency   Cellulitis of right leg   Cellulitis, leg   Pressure injury of skin  Cellulitis of left, right leg;   -Initially started on IV vancomycin and zosyn (for broad-spectrum coverage due to diabetes, chronic venous insufficiency) -Patient to keep leg elevated while at rest -Continue on scheduled lasix -ECHO reviewed with normal LVEF, no wall motion abnormality. -Patient would benefit from lymphedema management and outpatient follow-up -Patient is known to the vascular surgery service. Recommend close follow-up -Wound ostomy consulted. Appreciate input -Giving rising Cr, will transition to doxycycline IV plus zosyn. -Wound appears improving, however remains with multiple areas of bloody drainage posteriorly. No purulence    Essential hypertension, benign Continue   hydralazine , atenolol, in addition to his tekturna and lasix.  -Blood pressure currently stable. Vitals reviewed   Diabetes mellitus,- check CBG,started SSI -Hemoglobin A1c 5.7, suggesting good control -Currently stable   PAD (peripheral artery disease) - Continue aspirin, check ABI  -Patient known to vascular surgery service. Will have patient follow-up closely -Currently stable  Hyperlipidemia  - Continue statin  - Remains stable this time  DVT prophylaxis: Lovenox subQ Code Status: Full Family Communication: Pt in room, family not at bedside Disposition Plan: uncertain at this time  Consultants:   [facility]  Procedures:     Antimicrobials: Anti-infectives    Start     Dose/Rate Route Frequency Ordered Stop   03/16/17 1200  doxycycline (VIBRAMYCIN) 100 mg in dextrose 5 % 250 mL IVPB     100 mg 125 mL/hr over 120 Minutes Intravenous Every 12 hours 03/16/17 1109     03/15/17 0000  vancomycin (VANCOCIN) IVPB 750 mg/150 ml premix  Status:  Discontinued     750 mg 150 mL/hr over 60 Minutes Intravenous Every 12 hours 03/14/17 0904 03/16/17 1121   03/14/17 1800  piperacillin-tazobactam (ZOSYN) IVPB 3.375 g     3.375 g 12.5 mL/hr over 240 Minutes Intravenous Every 8 hours 03/14/17 0904     03/14/17 0845  piperacillin-tazobactam (ZOSYN) IVPB 3.375 g     3.375 g 100 mL/hr over 30 Minutes Intravenous  Once 03/14/17 0842 03/14/17 1141   03/14/17 0845  vancomycin (VANCOCIN) IVPB 1000 mg/200 mL premix     1,000 mg 200 mL/hr over 60 Minutes Intravenous  Once 03/14/17 0842 03/14/17 1212   03/14/17 0530  vancomycin (VANCOCIN) IVPB 1000 mg/200 mL premix     1,000  mg 200 mL/hr over 60 Minutes Intravenous  Once 03/14/17 0521 03/14/17 0654      Subjective: Without complaints today  Objective: Vitals:   03/15/17 1436 03/15/17 2302 03/16/17 0630 03/16/17 1344  BP: (!) 112/42 138/60 (!) 134/55 (!) 142/59  Pulse: 63 74 72 63  Resp: 16 18 18 18   Temp: 98 F (36.7 C) 98 F  (36.7 C) 98.1 F (36.7 C) 98.7 F (37.1 C)  TempSrc: Oral Oral Oral Oral  SpO2: 96% 100% 98% 100%  Weight:      Height:        Intake/Output Summary (Last 24 hours) at 03/16/17 1450 Last data filed at 03/16/17 1329  Gross per 24 hour  Intake             1002 ml  Output             3950 ml  Net            -2948 ml   Filed Weights   03/13/17 1415  Weight: 102.1 kg (225 lb)    Examination: General exam: Awake, laying in bed, in nad Respiratory system: Normal respiratory effort, no wheezing Cardiovascular system: regular rate, s1, s2 Gastrointestinal system: Soft, nondistended, positive BS Central nervous system: CN2-12 grossly intact, strength intact Extremities: Perfused, no clubbing Skin: Bilateral venous stasis skin changes in B LE with overlying erythema and bloody drainage posteriorly Psychiatry: Mood normal // no visual hallucinations    Data Reviewed: I have personally reviewed following labs and imaging studies  CBC:  Recent Labs Lab 03/14/17 0521 03/15/17 0601 03/16/17 0559  WBC 12.5* 9.7 11.5*  NEUTROABS 9.2*  --   --   HGB 11.9* 10.5* 11.3*  HCT 36.1* 32.2* 34.3*  MCV 75.8* 74.9* 75.9*  PLT 326 231 174   Basic Metabolic Panel:  Recent Labs Lab 03/14/17 0521 03/15/17 0601 03/16/17 0559  NA 136 136 138  K 4.0 3.6 4.6  CL 99* 102 99*  CO2 30 25 28   GLUCOSE 102* 93 84  BUN 23* 21* 23*  CREATININE 1.00 1.09 1.35*  CALCIUM 8.0* 7.2* 7.4*   GFR: Estimated Creatinine Clearance: 67.6 mL/min (A) (by C-G formula based on SCr of 1.35 mg/dL (H)). Liver Function Tests:  Recent Labs Lab 03/14/17 0521 03/15/17 0601  AST 23 21  ALT 20 15*  ALKPHOS 155* 104  BILITOT 0.8 1.0  PROT 6.2* 4.8*  ALBUMIN 2.3* 1.7*   No results for input(s): LIPASE, AMYLASE in the last 168 hours. No results for input(s): AMMONIA in the last 168 hours. Coagulation Profile: No results for input(s): INR, PROTIME in the last 168 hours. Cardiac Enzymes:  Recent  Labs Lab 03/14/17 1058 03/14/17 1457 03/14/17 2024  TROPONINI <0.03 <0.03 <0.03   BNP (last 3 results) No results for input(s): PROBNP in the last 8760 hours. HbA1C:  Recent Labs  03/14/17 1058  HGBA1C 5.7*   CBG:  Recent Labs Lab 03/15/17 0837 03/15/17 1156 03/15/17 1634 03/15/17 2013 03/16/17 0014  GLUCAP 135* 93 92 84 103*   Lipid Profile: No results for input(s): CHOL, HDL, LDLCALC, TRIG, CHOLHDL, LDLDIRECT in the last 72 hours. Thyroid Function Tests: No results for input(s): TSH, T4TOTAL, FREET4, T3FREE, THYROIDAB in the last 72 hours. Anemia Panel: No results for input(s): VITAMINB12, FOLATE, FERRITIN, TIBC, IRON, RETICCTPCT in the last 72 hours. Sepsis Labs: No results for input(s): PROCALCITON, LATICACIDVEN in the last 168 hours.  Recent Results (from the past 240 hour(s))  Culture, blood (routine x  2)     Status: None (Preliminary result)   Collection Time: 03/14/17 10:30 AM  Result Value Ref Range Status   Specimen Description BLOOD LEFT ANTECUBITAL  Final   Special Requests   Final    BOTTLES DRAWN AEROBIC AND ANAEROBIC Blood Culture adequate volume   Culture   Final    NO GROWTH 2 DAYS Performed at [facility], [place] [address] [place], [place] [address]    Report Status PENDING  Incomplete  Culture, blood (routine x 2)     Status: None (Preliminary result)   Collection Time: 03/14/17 10:58 AM  Result Value Ref Range Status   Specimen Description BLOOD RIGHT ARM  Final   Special Requests   Final    BOTTLES DRAWN AEROBIC AND ANAEROBIC Blood Culture adequate volume   Culture   Final    NO GROWTH 2 DAYS Performed at [facility], [place] [address], [employer], [place] [address]    Report Status PENDING  Incomplete     Radiology Studies: No results found.  Scheduled Meds: . aliskiren  150 mg Oral QODAY  . aspirin EC  81 mg Oral Daily  . atenolol  25 mg Oral Daily  . atorvastatin  80 mg Oral QPC supper  . collagenase   Topical Daily   . enoxaparin (LOVENOX) injection  40 mg Subcutaneous Q24H  . folic acid  1 mg Oral Daily  . furosemide  40 mg Intravenous Q12H  . hydrALAZINE  25 mg Oral Q8H  . insulin aspart  0-9 Units Subcutaneous Q4H  . pantoprazole  40 mg Oral Daily   Continuous Infusions: . doxycycline (VIBRAMYCIN) IV 100 mg (03/16/17 1225)  . piperacillin-tazobactam (ZOSYN)  IV Stopped (03/16/17 0710)     LOS: 2 days   Eusebio Blazejewski, Orpah Melter, MD [facility] Pager [phone]  If 7PM-7AM, please contact night-coverage [email or web address] Password [email or web address] 03/16/2017, 2:50 PM

## 2017-03-16 NOTE — Evaluation (Signed)
Occupational Therapy Evaluation Patient Details Name: Jordan [name] MRN: [id number] DOB: 04/08/1951 Today's Date: 03/16/2017    History of Present Illness 66 year old male with a history of   gastric esophageal reflux, COPD, diabetes, peripheral artery disease, morbid obesity, who presents to the ED via EMS, because of increasing lethargy, increasing falls over the last 2 weeks, worsening bilateral lower extremity edema with weeping and foul-smelling discharge. Patient was recently started on doxycycline but continued to have foul-smelling drainage from both his legs   Clinical Impression   This 66 y/o M presents with the above. At baseline Pt is mod independent with functional mobility, independent with ADL completion. Pt currently requires Max-total assist for LB ADLs and +2 ModA for functional mobility using RW. Pt will benefit from continued OT services in acute and SNF settings to progress pt's safety and independence with ADLs and functional mobility.     Follow Up Recommendations  SNF;Supervision/Assistance - 24 hour    Equipment Recommendations  Other (comment) (to be determined in next venue )           Precautions / Restrictions Precautions Precautions: Fall Restrictions Weight Bearing Restrictions: No      Mobility Bed Mobility Overal bed mobility: Needs Assistance Bed Mobility: Supine to Sit;Sit to Supine     Supine to sit: Mod assist;HOB elevated Sit to supine: Mod assist;+2 for physical assistance   General bed mobility comments: assist with legs, gentle support  of the trunk, +2 assist to return to supine for trunk/LE management   Transfers Overall transfer level: Needs assistance Equipment used: Rolling walker (2 wheeled) Transfers: Sit to/from Stand Sit to Stand: Mod assist;From elevated surface;+2 safety/equipment         General transfer comment: steady assist from raised bed, extra time to rise    Balance Overall balance assessment: Needs  assistance;History of Falls Sitting-balance support: Feet supported;Bilateral upper extremity supported Sitting balance-Leahy Scale: Good Sitting balance - Comments: static sitting EOB    Standing balance support: During functional activity;Bilateral upper extremity supported Standing balance-Leahy Scale: Poor                             ADL either performed or assessed with clinical judgement   ADL Overall ADL's : Needs assistance/impaired Eating/Feeding: Set up;Sitting   Grooming: Set up;Sitting;Wash/dry hands   Upper Body Bathing: Min guard;Sitting   Lower Body Bathing: Moderate assistance;Sit to/from stand   Upper Body Dressing : Minimal assistance;Sitting   Lower Body Dressing: Total assistance;Sit to/from stand Lower Body Dressing Details (indicate cue type and reason): total assist to don socks at bed level      Toileting- Clothing Manipulation and Hygiene: Moderate assistance;Sit to/from stand Toileting - Clothing Manipulation Details (indicate cue type and reason): Pt sat EOB to use urinal with assist for setup     Functional mobility during ADLs: Moderate assistance;Rolling walker;+2 for safety/equipment General ADL Comments: Pt stood with ModA +2 from elevated bed, ModA to steady in standing as Pt unsteady initially, took side steps along bedside with ModA +2 prior to return to sitting                         Pertinent Vitals/Pain Pain Assessment: Faces Faces Pain Scale: Hurts little more Pain Location: both legs with WB Pain Descriptors / Indicators: Discomfort;Tightness Pain Intervention(s): Monitored during session;Repositioned          Extremity/Trunk Assessment Upper Extremity  Assessment Upper Extremity Assessment: Generalized weakness   Lower Extremity Assessment Lower Extremity Assessment: Defer to PT evaluation       Communication Communication Communication: No difficulties   Cognition Arousal/Alertness:  Awake/alert Behavior During Therapy: WFL for tasks assessed/performed Overall Cognitive Status: Impaired/Different from baseline                                                      Home Living Family/patient expects to be discharged to:: Private residence Living Arrangements: Non-relatives/Friends Available Help at Discharge: Friend(s) Type of Home: House Home Access: Stairs to enter [employer] of Steps: 7 Entrance Stairs-Rails: Right;Left Home Layout: Two level Alternate Level Stairs-Number of Steps: 1   Bathroom Shower/Tub: Teacher, early years/pre: Standard     Home Equipment: Environmental consultant - 2 wheels;Cane - single point;Bedside commode   Additional Comments: Pt thinks he has a BSC, though would have to look for it       Prior Functioning/Environment Level of Independence: Independent with assistive device(s)        Comments: still drives        OT Problem List: Decreased strength;Decreased activity tolerance;Decreased knowledge of use of DME or AE;Decreased safety awareness;Pain      OT Treatment/Interventions: Self-care/ADL training;DME and/or AE instruction;Therapeutic activities;Balance training;Therapeutic exercise;Patient/family education;Energy conservation    OT Goals(Current goals can be found in the care plan section) Acute Rehab OT Goals Patient Stated Goal: i want to get these legs healed OT Goal Formulation: With patient Time For Goal Achievement: 03/30/17 Potential to Achieve Goals: Good  OT Frequency: Min 2X/week                             AM-PAC PT "6 Clicks" Daily Activity     Outcome Measure Help from another person eating meals?: None Help from another person taking care of personal grooming?: A Little Help from another person toileting, which includes using toliet, bedpan, or urinal?: A Lot Help from another person bathing (including washing, rinsing, drying)?: A Lot Help from another  person to put on and taking off regular upper body clothing?: A Little Help from another person to put on and taking off regular lower body clothing?: A Lot 6 Click Score: 16   End of Session Equipment Utilized During Treatment: Gait belt;Rolling walker Nurse Communication: Mobility status  Activity Tolerance: Patient tolerated treatment well Patient left: in bed;with call bell/phone within reach;with bed alarm set  OT Visit Diagnosis: Unsteadiness on feet (R26.81);Muscle weakness (generalized) (M62.81);History of falling (Z91.81)                Time: 6222-9798 OT Time Calculation (min): 29 min Charges:  OT General Charges $OT Visit: 1 Visit OT Evaluation $OT Eval Low Complexity: 1 Low OT Treatments $Self Care/Home Management : 8-22 mins G-Codes:     Lou Cal, OT Pager [phone] 03/16/2017   Raymondo Band 03/16/2017, 4:08 PM

## 2017-03-17 LAB — BASIC METABOLIC PANEL
Anion gap: 10 (ref 5–15)
BUN: 23 mg/dL — AB (ref 6–20)
CALCIUM: 7.4 mg/dL — AB (ref 8.9–10.3)
CO2: 32 mmol/L (ref 22–32)
CREATININE: 1.29 mg/dL — AB (ref 0.61–1.24)
Chloride: 96 mmol/L — ABNORMAL LOW (ref 101–111)
GFR calc Af Amer: >60 mL/min (ref >60)
GFR, EST NON AFRICAN AMERICAN: 56 mL/min — AB (ref >60)
GLUCOSE: 82 mg/dL (ref 65–99)
Potassium: 3.1 mmol/L — ABNORMAL LOW (ref 3.5–5.1)
SODIUM: 138 mmol/L (ref 135–145)

## 2017-03-17 LAB — GLUCOSE, CAPILLARY
GLUCOSE-CAPILLARY: 80 mg/dL (ref 65–99)
GLUCOSE-CAPILLARY: 89 mg/dL (ref 65–99)
GLUCOSE-CAPILLARY: 91 mg/dL (ref 65–99)
GLUCOSE-CAPILLARY: 92 mg/dL (ref 65–99)
Glucose-Capillary: 125 mg/dL — ABNORMAL HIGH (ref 65–99)
Glucose-Capillary: 70 mg/dL (ref 65–99)
Glucose-Capillary: 98 mg/dL (ref 65–99)
Glucose-Capillary: 99 mg/dL (ref 65–99)

## 2017-03-17 MED ORDER — POTASSIUM CHLORIDE CRYS ER 20 MEQ PO TBCR
40.0000 meq | EXTENDED_RELEASE_TABLET | Freq: Two times a day (BID) | ORAL | Status: AC
  Administered 2017-03-17 (×2): 40 meq via ORAL
  Filled 2017-03-17 (×2): qty 2

## 2017-03-17 MED ORDER — CYANOCOBALAMIN 1000 MCG/ML IJ SOLN
1000.0000 ug | Freq: Once | INTRAMUSCULAR | Status: AC
Start: 2017-03-17 — End: 2017-03-17
  Administered 2017-03-17: 1000 ug via INTRAMUSCULAR
  Filled 2017-03-17: qty 1

## 2017-03-17 NOTE — Consult Note (Signed)
[place] Nurse wound follow up Nursing staff called for clarification of wound care orders.  Orders provided that are consistent with the intent of my colleague, K. Sanders. [place] nursing team will not follow, but will remain available to this patient, the nursing and medical teams.  Please re-consult if needed. Thanks, Maudie Flakes, MSN, RN, [place], Arther Abbott  Pager# [phone]

## 2017-03-17 NOTE — Care Management Note (Signed)
Case Management Note  Patient Details  Name: Jordan [name] MRN: [id number] Date of Birth: 1951/04/18  Subjective/Objective: Noted WOC note for bilateral leg wounds. PT recc SNF. CSW already following for SNF.                  Action/Plan:d/c SNF.   Expected Discharge Date:   (unknown)               Expected Discharge Plan:  Skilled Nursing Facility  In-House Referral:  Clinical Social Work  Discharge planning Services  CM Consult  Post Acute Care Choice:    Choice offered to:     DME Arranged:    DME Agency:     HH Arranged:    [place] Agency:     Status of Service:  In process, will continue to follow  If discussed at Long Length of Stay Meetings, dates discussed:    Additional Comments:  Dessa Phi, RN 03/17/2017, 12:08 PM

## 2017-03-17 NOTE — Progress Notes (Signed)
Pharmacy Antibiotic Note  Jordan [name] is a 66 y.o. male admitted on 03/14/2017 with cellulitis.  Pharmacy has been consulted for Zosyn dosing. Doxycyline being used for vancomycin for increase SCr.   Today, 03/17/2017   Renal: SCr mildly improved (remains on aliskiren, IV furosemide)  No CBC today   Afebrile  BCx = NGTD  Plan:  Zosyn remains appropriately dosed at 3.375gm IV q8h over 4h infusion  Follow-up appropriateness of de-escalation (? Need pseudomonas or anaerobic coverage for cellulitis)  Pharmacy to follow at distance  Height: 6\' 1"  (185.4 cm) Weight: 225 lb (102.1 kg) IBW/kg (Calculated) : 79.9  Temp (24hrs), Avg:98.7 F (37.1 C), Min:98.4 F (36.9 C), Max:99 F (37.2 C)   Recent Labs Lab 03/14/17 0521 03/15/17 0601 03/16/17 0559 03/17/17 0505  WBC 12.5* 9.7 11.5*  --   CREATININE 1.00 1.09 1.35* 1.29*    Estimated Creatinine Clearance: 70.7 mL/min (A) (by C-G formula based on SCr of 1.29 mg/dL (H)).    Allergies  Allergen Reactions  . Altace [Ramipril]     Dizziness, migraine, weakness  . Codeine Itching  . Naproxen Itching     Thank you for allowing pharmacy to be a part of this patient's care.  Doreene Eland, PharmD, BCPS.   Pager: [phone] 03/17/2017 7:52 AM

## 2017-03-17 NOTE — Progress Notes (Signed)
PROGRESS NOTE    Jordan [name]  LDJ:[id number] DOB: Jan 07, 1951 DOA: 03/14/2017 PCP: [name], MD    Brief Narrative:  66 year old male with a history of   gastric esophageal reflux, COPD, diabetes, peripheral artery disease, morbid obesity, who presents to the ED via EMS, because of increasing lethargy, increasing falls over the last 2 weeks, worsening bilateral lower extremity edema with weeping and foul-smelling discharge. Patient was recently started on doxycycline but continued to have foul-smelling drainage from both his legs. Patient also complains of left knee pain and right shoulder pain. Patient saw an orthopedic surgeon for his left knee about 3 or 4 weeks ago and corticosteroid injection in the knee. Patient has noticed bilateral lower extremity erythema with worsening edema. Denies any fever, chills,rigors. He has been cleaning his legs with peroxide daily.  Assessment & Plan:   Principal Problem:   Cellulitis of leg, left Active Problems:   Essential hypertension, benign   Edema   Diabetes (HCC)   Severe obesity (BMI >= 40) (HCC)   PAD (peripheral artery disease) (HCC)   Chronic venous insufficiency   Cellulitis of right leg   Cellulitis, leg   Pressure injury of skin  Cellulitis of left, right leg;   -Initially started on IV vancomycin and zosyn (for broad-spectrum coverage due to diabetes, chronic venous insufficiency) -Patient to keep leg elevated while at rest -Continue on scheduled lasix -ECHO reviewed with normal LVEF, no wall motion abnormality. -Patient would benefit from lymphedema management and outpatient follow-up -Patient is known to the vascular surgery service. Recommend close follow-up -Wound ostomy consulted. Appreciate input -Giving rising Cr, have transitioned to doxycycline IV plus zosyn. -Wound appears largely unchanged    Essential hypertension, benign Continue  hydralazine , atenolol, in addition to his tekturna and lasix.  -Blood  pressure presently stable. Continue to follow   Diabetes mellitus,- check CBG,started SSI -Hemoglobin A1c 5.7, suggesting good control -Remains stable   PAD (peripheral artery disease) - Continue aspirin, check ABI  -Patient known to vascular surgery service. Will have patient follow-up closely -Remains stable  Hyperlipidemia  - Continue statin  - Currently stable  DVT prophylaxis: Lovenox subQ Code Status: Full Family Communication: Pt in room, family not at bedside Disposition Plan: uncertain at this time  Consultants:   [place]  Procedures:     Antimicrobials: Anti-infectives    Start     Dose/Rate Route Frequency Ordered Stop   03/16/17 1200  doxycycline (VIBRAMYCIN) 100 mg in dextrose 5 % 250 mL IVPB     100 mg 125 mL/hr over 120 Minutes Intravenous Every 12 hours 03/16/17 1109     03/15/17 0000  vancomycin (VANCOCIN) IVPB 750 mg/150 ml premix  Status:  Discontinued     750 mg 150 mL/hr over 60 Minutes Intravenous Every 12 hours 03/14/17 0904 03/16/17 1121   03/14/17 1800  piperacillin-tazobactam (ZOSYN) IVPB 3.375 g     3.375 g 12.5 mL/hr over 240 Minutes Intravenous Every 8 hours 03/14/17 0904     03/14/17 0845  piperacillin-tazobactam (ZOSYN) IVPB 3.375 g     3.375 g 100 mL/hr over 30 Minutes Intravenous  Once 03/14/17 0842 03/14/17 1141   03/14/17 0845  vancomycin (VANCOCIN) IVPB 1000 mg/200 mL premix     1,000 mg 200 mL/hr over 60 Minutes Intravenous  Once 03/14/17 0842 03/14/17 1212   03/14/17 0530  vancomycin (VANCOCIN) IVPB 1000 mg/200 mL premix     1,000 mg 200 mL/hr over 60 Minutes Intravenous  Once 03/14/17 0521  03/14/17 0654      Subjective: No complaints this AM  Objective: Vitals:   03/16/17 1344 03/16/17 2032 03/17/17 0434 03/17/17 1354  BP: (!) 142/59 (!) 124/49 (!) 137/50 (!) 123/51  Pulse: 63 67 63 61  Resp: 18 18 18 16   Temp: 98.7 F (37.1 C) 98.4 F (36.9 C) 99 F (37.2 C) 97.7 F (36.5 C)  TempSrc: Oral Oral Oral Oral    SpO2: 100% 97% 95% 96%  Weight:      Height:        Intake/Output Summary (Last 24 hours) at 03/17/17 1649 Last data filed at 03/17/17 1200  Gross per 24 hour  Intake              960 ml  Output             2475 ml  Net            -1515 ml   Filed Weights   03/13/17 1415  Weight: 102.1 kg (225 lb)    Examination: General exam: Conversant, in no acute distress Respiratory system: normal chest rise, clear, no audible wheezing Cardiovascular system: regular rhythm, s1-s2 Gastrointestinal system: Nondistended, nontender, pos BS Central nervous system: No seizures, no tremors Extremities: No cyanosis, no joint deformities Skin: No rashes, no pallor Psychiatry: Affect normal // no auditory hallucinations    Data Reviewed: I have personally reviewed following labs and imaging studies  CBC:  Recent Labs Lab 03/14/17 0521 03/15/17 0601 03/16/17 0559  WBC 12.5* 9.7 11.5*  NEUTROABS 9.2*  --   --   HGB 11.9* 10.5* 11.3*  HCT 36.1* 32.2* 34.3*  MCV 75.8* 74.9* 75.9*  PLT 326 231 732   Basic Metabolic Panel:  Recent Labs Lab 03/14/17 0521 03/15/17 0601 03/16/17 0559 03/17/17 0505  NA 136 136 138 138  K 4.0 3.6 4.6 3.1*  CL 99* 102 99* 96*  CO2 30 25 28  32  GLUCOSE 102* 93 84 82  BUN 23* 21* 23* 23*  CREATININE 1.00 1.09 1.35* 1.29*  CALCIUM 8.0* 7.2* 7.4* 7.4*   GFR: Estimated Creatinine Clearance: 70.7 mL/min (A) (by C-G formula based on SCr of 1.29 mg/dL (H)). Liver Function Tests:  Recent Labs Lab 03/14/17 0521 03/15/17 0601  AST 23 21  ALT 20 15*  ALKPHOS 155* 104  BILITOT 0.8 1.0  PROT 6.2* 4.8*  ALBUMIN 2.3* 1.7*   No results for input(s): LIPASE, AMYLASE in the last 168 hours. No results for input(s): AMMONIA in the last 168 hours. Coagulation Profile: No results for input(s): INR, PROTIME in the last 168 hours. Cardiac Enzymes:  Recent Labs Lab 03/14/17 1058 03/14/17 1457 03/14/17 2024  TROPONINI <0.03 <0.03 <0.03   BNP (last 3  results) No results for input(s): PROBNP in the last 8760 hours. HbA1C: No results for input(s): HGBA1C in the last 72 hours. CBG:  Recent Labs Lab 03/16/17 2010 03/17/17 0036 03/17/17 0427 03/17/17 0740 03/17/17 1125  GLUCAP 106* 92 89 91 80   Lipid Profile: No results for input(s): CHOL, HDL, LDLCALC, TRIG, CHOLHDL, LDLDIRECT in the last 72 hours. Thyroid Function Tests: No results for input(s): TSH, T4TOTAL, FREET4, T3FREE, THYROIDAB in the last 72 hours. Anemia Panel: No results for input(s): VITAMINB12, FOLATE, FERRITIN, TIBC, IRON, RETICCTPCT in the last 72 hours. Sepsis Labs: No results for input(s): PROCALCITON, LATICACIDVEN in the last 168 hours.  Recent Results (from the past 240 hour(s))  Culture, blood (routine x 2)     Status: None (Preliminary  result)   Collection Time: 03/14/17 10:30 AM  Result Value Ref Range Status   Specimen Description BLOOD LEFT ANTECUBITAL  Final   Special Requests   Final    BOTTLES DRAWN AEROBIC AND ANAEROBIC Blood Culture adequate volume   Culture   Final    NO GROWTH 3 DAYS Performed at [facility], [address], [place], [place] [address]    Report Status PENDING  Incomplete  Culture, blood (routine x 2)     Status: None (Preliminary result)   Collection Time: 03/14/17 10:58 AM  Result Value Ref Range Status   Specimen Description BLOOD RIGHT ARM  Final   Special Requests   Final    BOTTLES DRAWN AEROBIC AND ANAEROBIC Blood Culture adequate volume   Culture   Final    NO GROWTH 3 DAYS Performed at [facility], [address], [place],  [address]    Report Status PENDING  Incomplete     Radiology Studies: No results found.  Scheduled Meds: . aliskiren  150 mg Oral QODAY  . aspirin EC  81 mg Oral Daily  . atenolol  25 mg Oral Daily  . atorvastatin  80 mg Oral QPC supper  . collagenase   Topical Daily  . enoxaparin (LOVENOX) injection  40 mg Subcutaneous Q24H  . folic acid  1 mg Oral Daily  .  furosemide  40 mg Intravenous Q12H  . hydrALAZINE  25 mg Oral Q8H  . insulin aspart  0-9 Units Subcutaneous Q4H  . pantoprazole  40 mg Oral Daily  . potassium chloride  40 mEq Oral BID   Continuous Infusions: . doxycycline (VIBRAMYCIN) IV 100 mg (03/17/17 1440)  . piperacillin-tazobactam (ZOSYN)  IV Stopped (03/17/17 1422)     LOS: 3 days   CHIU, Orpah Melter, MD [facility] Pager [phone]  If 7PM-7AM, please contact night-coverage [email or web address] Password [email or web address] 03/17/2017, 4:49 PM

## 2017-03-18 ENCOUNTER — Inpatient Hospital Stay (HOSPITAL_COMMUNITY): Payer: Medicare Other

## 2017-03-18 DIAGNOSIS — M12541 Traumatic arthropathy, right hand: Secondary | ICD-10-CM

## 2017-03-18 LAB — GLUCOSE, CAPILLARY
GLUCOSE-CAPILLARY: 92 mg/dL (ref 65–99)
GLUCOSE-CAPILLARY: 89 mg/dL (ref 65–99)
GLUCOSE-CAPILLARY: 122 mg/dL — AB (ref 65–99)
Glucose-Capillary: 92 mg/dL (ref 65–99)
Glucose-Capillary: 100 mg/dL — ABNORMAL HIGH (ref 65–99)

## 2017-03-18 LAB — BASIC METABOLIC PANEL
Anion gap: 9 (ref 5–15)
BUN: 22 mg/dL — ABNORMAL HIGH (ref 6–20)
CALCIUM: 7.4 mg/dL — AB (ref 8.9–10.3)
CO2: 30 mmol/L (ref 22–32)
CREATININE: 1.21 mg/dL (ref 0.61–1.24)
Chloride: 98 mmol/L — ABNORMAL LOW (ref 101–111)
GFR calc non Af Amer: >60 mL/min (ref >60)
Glucose, Bld: 87 mg/dL (ref 65–99)
Potassium: 3.5 mmol/L (ref 3.5–5.1)
SODIUM: 137 mmol/L (ref 135–145)

## 2017-03-18 MED ORDER — AMOXICILLIN-POT CLAVULANATE 875-125 MG PO TABS
1.0000 | ORAL_TABLET | Freq: Two times a day (BID) | ORAL | Status: DC
  Administered 2017-03-18 – 2017-03-20 (×4): 1 via ORAL
  Filled 2017-03-18 (×4): qty 1

## 2017-03-18 MED ORDER — DOXYCYCLINE HYCLATE 100 MG PO TABS
100.0000 mg | ORAL_TABLET | Freq: Two times a day (BID) | ORAL | Status: DC
  Administered 2017-03-18 – 2017-03-20 (×4): 100 mg via ORAL
  Filled 2017-03-18 (×4): qty 1

## 2017-03-18 NOTE — Progress Notes (Addendum)
PROGRESS NOTE    Jordan [name]  [id number]:[id number] DOB: August 18, 1950 DOA: 03/14/2017 PCP: [name], MD    Brief Narrative:  66 year old male with a history of   gastric esophageal reflux, COPD, diabetes, peripheral artery disease, morbid obesity, who presents to the ED via EMS, because of increasing lethargy, increasing falls over the last 2 weeks, worsening bilateral lower extremity edema with weeping and foul-smelling discharge. Patient was recently started on doxycycline but continued to have foul-smelling drainage from both his legs. Patient also complains of left knee pain and right shoulder pain. Patient saw an orthopedic surgeon for his left knee about 3 or 4 weeks ago and corticosteroid injection in the knee. Patient has noticed bilateral lower extremity erythema with worsening edema. Denies any fever, chills,rigors. He has been cleaning his legs with peroxide daily.  Assessment & Plan:   Principal Problem:   Cellulitis of leg, left Active Problems:   Essential hypertension, benign   Edema   Diabetes (HCC)   Severe obesity (BMI >= 40) (HCC)   PAD (peripheral artery disease) (HCC)   Chronic venous insufficiency   Cellulitis of right leg   Cellulitis, leg   Pressure injury of skin  Cellulitis of left, right leg;   -Initially started on IV vancomycin and zosyn (for broad-spectrum coverage due to diabetes, chronic venous insufficiency) -Patient to keep leg elevated while at rest -Continue on scheduled lasix -ECHO reviewed with normal LVEF, no wall motion abnormality. -Patient would benefit from lymphedema management and outpatient follow-up -Patient is known to the vascular surgery service. Recommend close follow-up -Wound ostomy consulted. Appreciate input -Giving rising Cr, had transitioned to doxycycline IV plus zosyn. -Wound seems improving. Will change to doxy plus augmentin    Essential hypertension, benign Continue  hydralazine , atenolol, in addition to his  tekturna and lasix.  -Blood pressure presently stable.  -Continue to titrate bp meds accordingly   Diabetes mellitus,- check CBG,started SSI -Hemoglobin A1c 5.7, suggesting good control -Remains stable at this time   PAD (peripheral artery disease) - Continue aspirin, check ABI  -Patient known to vascular surgery service. Will have patient follow-up closely -Patient remains stable at this time  Hyperlipidemia  - Continue statin  - Remains stable  R hand pain -reports trauma to R hand several weeks ago -R hand mildly swollen, tender -Will order R hand xray  DVT prophylaxis: Lovenox subQ Code Status: Full Family Communication: Pt in room, family not at bedside Disposition Plan: uncertain at this time  Consultants:   [place]  Procedures:     Antimicrobials: Anti-infectives    Start     Dose/Rate Route Frequency Ordered Stop   03/18/17 2200  amoxicillin-clavulanate (AUGMENTIN) 875-125 MG per tablet 1 tablet     1 tablet Oral Every 12 hours 03/18/17 1609     03/16/17 1200  doxycycline (VIBRAMYCIN) 100 mg in dextrose 5 % 250 mL IVPB     100 mg 125 mL/hr over 120 Minutes Intravenous Every 12 hours 03/16/17 1109     03/15/17 0000  vancomycin (VANCOCIN) IVPB 750 mg/150 ml premix  Status:  Discontinued     750 mg 150 mL/hr over 60 Minutes Intravenous Every 12 hours 03/14/17 0904 03/16/17 1121   03/14/17 1800  piperacillin-tazobactam (ZOSYN) IVPB 3.375 g     3.375 g 12.5 mL/hr over 240 Minutes Intravenous Every 8 hours 03/14/17 0904     03/14/17 0845  piperacillin-tazobactam (ZOSYN) IVPB 3.375 g     3.375 g 100 mL/hr over 30 Minutes  Intravenous  Once 03/14/17 0842 03/14/17 1141   03/14/17 0845  vancomycin (VANCOCIN) IVPB 1000 mg/200 mL premix     1,000 mg 200 mL/hr over 60 Minutes Intravenous  Once 03/14/17 0842 03/14/17 1212   03/14/17 0530  vancomycin (VANCOCIN) IVPB 1000 mg/200 mL premix     1,000 mg 200 mL/hr over 60 Minutes Intravenous  Once 03/14/17 0521  03/14/17 0654      Subjective: Without complaints  Objective: Vitals:   03/17/17 1354 03/17/17 2050 03/18/17 0549 03/18/17 1501  BP: (!) 123/51 (!) 132/59 (!) 145/58 (!) 104/40  Pulse: 61 67 65 62  Resp: 16 15 16 16   Temp: 97.7 F (36.5 C) 99 F (37.2 C) 98.3 F (36.8 C) 99.3 F (37.4 C)  TempSrc: Oral Oral Oral Oral  SpO2: 96% 99% 97% 100%  Weight:      Height:        Intake/Output Summary (Last 24 hours) at 03/18/17 1609 Last data filed at 03/18/17 1400  Gross per 24 hour  Intake             1320 ml  Output                0 ml  Net             1320 ml   Filed Weights   03/13/17 1415  Weight: 102.1 kg (225 lb)    Examination: General exam: Awake, laying in bed, in nad Respiratory system: Normal respiratory effort, no wheezing Cardiovascular system: regular rate, s1, s2 Gastrointestinal system: Soft, nondistended, positive BS Central nervous system: CN2-12 grossly intact, strength intact Extremities: Perfused, no clubbing Skin: exposed skin darkened with no active drainage Psychiatry: Mood normal // no visual hallucinations    Data Reviewed: I have personally reviewed following labs and imaging studies  CBC:  Recent Labs Lab 03/14/17 0521 03/15/17 0601 03/16/17 0559  WBC 12.5* 9.7 11.5*  NEUTROABS 9.2*  --   --   HGB 11.9* 10.5* 11.3*  HCT 36.1* 32.2* 34.3*  MCV 75.8* 74.9* 75.9*  PLT 326 231 778   Basic Metabolic Panel:  Recent Labs Lab 03/14/17 0521 03/15/17 0601 03/16/17 0559 03/17/17 0505 03/18/17 0506  NA 136 136 138 138 137  K 4.0 3.6 4.6 3.1* 3.5  CL 99* 102 99* 96* 98*  CO2 30 25 28  32 30  GLUCOSE 102* 93 84 82 87  BUN 23* 21* 23* 23* 22*  CREATININE 1.00 1.09 1.35* 1.29* 1.21  CALCIUM 8.0* 7.2* 7.4* 7.4* 7.4*   GFR: Estimated Creatinine Clearance: 75.4 mL/min (by C-G formula based on SCr of 1.21 mg/dL). Liver Function Tests:  Recent Labs Lab 03/14/17 0521 03/15/17 0601  AST 23 21  ALT 20 15*  ALKPHOS 155* 104    BILITOT 0.8 1.0  PROT 6.2* 4.8*  ALBUMIN 2.3* 1.7*   No results for input(s): LIPASE, AMYLASE in the last 168 hours. No results for input(s): AMMONIA in the last 168 hours. Coagulation Profile: No results for input(s): INR, PROTIME in the last 168 hours. Cardiac Enzymes:  Recent Labs Lab 03/14/17 1058 03/14/17 1457 03/14/17 2024  TROPONINI <0.03 <0.03 <0.03   BNP (last 3 results) No results for input(s): PROBNP in the last 8760 hours. HbA1C: No results for input(s): HGBA1C in the last 72 hours. CBG:  Recent Labs Lab 03/17/17 2053 03/17/17 2341 03/18/17 0348 03/18/17 0719 03/18/17 1140  GLUCAP 125* 99 92 89 92   Lipid Profile: No results for input(s): CHOL, HDL, LDLCALC,  TRIG, CHOLHDL, LDLDIRECT in the last 72 hours. Thyroid Function Tests: No results for input(s): TSH, T4TOTAL, FREET4, T3FREE, THYROIDAB in the last 72 hours. Anemia Panel: No results for input(s): VITAMINB12, FOLATE, FERRITIN, TIBC, IRON, RETICCTPCT in the last 72 hours. Sepsis Labs: No results for input(s): PROCALCITON, LATICACIDVEN in the last 168 hours.  Recent Results (from the past 240 hour(s))  Culture, blood (routine x 2)     Status: None (Preliminary result)   Collection Time: 03/14/17 10:30 AM  Result Value Ref Range Status   Specimen Description BLOOD LEFT ANTECUBITAL  Final   Special Requests   Final    BOTTLES DRAWN AEROBIC AND ANAEROBIC Blood Culture adequate volume   Culture   Final    NO GROWTH 4 DAYS Performed at [facility], [address], [place], [place] [address]    Report Status PENDING  Incomplete  Culture, blood (routine x 2)     Status: None (Preliminary result)   Collection Time: 03/14/17 10:58 AM  Result Value Ref Range Status   Specimen Description BLOOD RIGHT ARM  Final   Special Requests   Final    BOTTLES DRAWN AEROBIC AND ANAEROBIC Blood Culture adequate volume   Culture   Final    NO GROWTH 4 DAYS Performed at [facility], [place] [address] [place], [place] [address]    Report Status PENDING  Incomplete     Radiology Studies: Dg Hand Complete Right  Result Date: 03/18/2017 CLINICAL DATA:  66 year old male with a history of hand pain after a fall EXAM: RIGHT HAND - COMPLETE 3+ VIEW COMPARISON:  None. FINDINGS: No acute displaced fracture identified. Mild degenerative changes of the interphalangeal joints. No radiopaque foreign body. No subluxation/dislocation. No focal soft tissue swelling. IMPRESSION: Negative for acute bony abnormality. Electronically Signed   By: Corrie Mckusick D.O.   On: 03/18/2017 15:02    Scheduled Meds: . aliskiren  150 mg Oral QODAY  . amoxicillin-clavulanate  1 tablet Oral Q12H  . aspirin EC  81 mg Oral Daily  . atenolol  25 mg Oral Daily  . atorvastatin  80 mg Oral QPC supper  . collagenase   Topical Daily  . enoxaparin (LOVENOX) injection  40 mg Subcutaneous Q24H  . folic acid  1 mg Oral Daily  . furosemide  40 mg Intravenous Q12H  . hydrALAZINE  25 mg Oral Q8H  . insulin aspart  0-9 Units Subcutaneous Q4H  . pantoprazole  40 mg Oral Daily   Continuous Infusions: . doxycycline (VIBRAMYCIN) IV Stopped (03/18/17 1550)  . piperacillin-tazobactam (ZOSYN)  IV Stopped (03/18/17 1351)     LOS: 4 days   Jamiria Langill, Orpah Melter, MD [facility] Pager [phone]  If 7PM-7AM, please contact night-coverage [email or web address] Password [email or web address] 03/18/2017, 4:09 PM

## 2017-03-19 LAB — BASIC METABOLIC PANEL
ANION GAP: 10 (ref 5–15)
BUN: 24 mg/dL — ABNORMAL HIGH (ref 6–20)
CHLORIDE: 96 mmol/L — AB (ref 101–111)
CO2: 28 mmol/L (ref 22–32)
CREATININE: 1.1 mg/dL (ref 0.61–1.24)
Calcium: 7.5 mg/dL — ABNORMAL LOW (ref 8.9–10.3)
GFR calc non Af Amer: >60 mL/min (ref >60)
Glucose, Bld: 88 mg/dL (ref 65–99)
POTASSIUM: 3.1 mmol/L — AB (ref 3.5–5.1)
SODIUM: 134 mmol/L — AB (ref 135–145)

## 2017-03-19 LAB — CBC
HCT: 35 % — ABNORMAL LOW (ref 39.0–52.0)
Hemoglobin: 11.4 g/dL — ABNORMAL LOW (ref 13.0–17.0)
MCH: 24.7 pg — ABNORMAL LOW (ref 26.0–34.0)
MCHC: 32.6 g/dL (ref 30.0–36.0)
MCV: 75.8 fL — AB (ref 78.0–100.0)
Platelets: 291 10*3/uL (ref 150–400)
RBC: 4.62 MIL/uL (ref 4.22–5.81)
RDW: 18.8 % — ABNORMAL HIGH (ref 11.5–15.5)
WBC: 10.3 10*3/uL (ref 4.0–10.5)

## 2017-03-19 LAB — GLUCOSE, CAPILLARY
GLUCOSE-CAPILLARY: 92 mg/dL (ref 65–99)
GLUCOSE-CAPILLARY: 87 mg/dL (ref 65–99)
GLUCOSE-CAPILLARY: 101 mg/dL — AB (ref 65–99)
Glucose-Capillary: 105 mg/dL — ABNORMAL HIGH (ref 65–99)
Glucose-Capillary: 84 mg/dL (ref 65–99)
Glucose-Capillary: 84 mg/dL (ref 65–99)

## 2017-03-19 LAB — CULTURE, BLOOD (ROUTINE X 2)
CULTURE: NO GROWTH
CULTURE: NO GROWTH
SPECIAL REQUESTS: ADEQUATE
Special Requests: ADEQUATE

## 2017-03-19 LAB — MAGNESIUM: Magnesium: 1.4 mg/dL — ABNORMAL LOW (ref 1.7–2.4)

## 2017-03-19 MED ORDER — MAGNESIUM SULFATE 4 GM/100ML IV SOLN
4.0000 g | Freq: Once | INTRAVENOUS | Status: AC
  Administered 2017-03-19: 4 g via INTRAVENOUS
  Filled 2017-03-19: qty 100

## 2017-03-19 MED ORDER — POTASSIUM CHLORIDE CRYS ER 20 MEQ PO TBCR
40.0000 meq | EXTENDED_RELEASE_TABLET | Freq: Two times a day (BID) | ORAL | Status: AC
  Administered 2017-03-19 (×2): 40 meq via ORAL
  Filled 2017-03-19 (×2): qty 2

## 2017-03-19 NOTE — Progress Notes (Signed)
CSW met with patient to confirm facility preference of [facility] in [place]. CSW confirmed with facility representative that the patient would be able to admit tomorrow, if medically stable. CSW alerted MD, and informed patient that bed was available for tomorrow.  CSW to follow for medical readiness to admit to SNF tomorrow.  Laveda Abbe, LCSW Clinical Social Worker [phone], for Weekend Coverage

## 2017-03-19 NOTE — Progress Notes (Signed)
PROGRESS NOTE    Jordan [name]  WJX:[id number] DOB: 05-21-1951 DOA: 03/14/2017 PCP: [name], MD    Brief Narrative:  66 year old male with a history of   gastric esophageal reflux, COPD, diabetes, peripheral artery disease, morbid obesity, who presents to the ED via EMS, because of increasing lethargy, increasing falls over the last 2 weeks, worsening bilateral lower extremity edema with weeping and foul-smelling discharge. Patient was recently started on doxycycline but continued to have foul-smelling drainage from both his legs. Patient also complains of left knee pain and right shoulder pain. Patient saw an orthopedic surgeon for his left knee about 3 or 4 weeks ago and corticosteroid injection in the knee. Patient has noticed bilateral lower extremity erythema with worsening edema. Denies any fever, chills,rigors. He has been cleaning his legs with peroxide daily.  Assessment & Plan:   Principal Problem:   Cellulitis of leg, left Active Problems:   Essential hypertension, benign   Edema   Diabetes (HCC)   Severe obesity (BMI >= 40) (HCC)   PAD (peripheral artery disease) (HCC)   Chronic venous insufficiency   Cellulitis of right leg   Cellulitis, leg   Pressure injury of skin  Cellulitis of left, right leg;   -Initially started on IV vancomycin and zosyn (for broad-spectrum coverage due to diabetes, chronic venous insufficiency) -Patient to keep leg elevated while at rest -Continue on scheduled lasix -ECHO reviewed with normal LVEF, no wall motion abnormality. -Patient would benefit from lymphedema management and outpatient follow-up -Patient is known to the vascular surgery service. Recommend close follow-up -Wound ostomy consulted. Appreciate input -Giving rising Cr, had transitioned to doxycycline IV plus zosyn. -Wound appears unchanged, with drainage through unaboot    Essential hypertension, benign Continue  hydralazine , atenolol, in addition to his tekturna  and lasix.  -Blood pressure presently stable.  -Stable at this time   Diabetes mellitus,- check CBG,started SSI -Hemoglobin A1c 5.7, suggesting good control -Currently stable   PAD (peripheral artery disease) - Continue aspirin, check ABI  -Patient known to vascular surgery service. Will have patient follow-up closely -Currently stable  Hyperlipidemia  - Continue statin  - Currently stable  R hand pain -reports trauma to R hand several weeks ago -R hand mildly swollen, tender -ordered and reviewed hand xray. No acute process  DVT prophylaxis: Lovenox subQ Code Status: Full Family Communication: Pt in room, family not at bedside Disposition Plan: uncertain at this time  Consultants:   [place]  Procedures:     Antimicrobials: Anti-infectives    Start     Dose/Rate Route Frequency Ordered Stop   03/18/17 2200  amoxicillin-clavulanate (AUGMENTIN) 875-125 MG per tablet 1 tablet     1 tablet Oral Every 12 hours 03/18/17 1609     03/18/17 2200  doxycycline (VIBRA-TABS) tablet 100 mg     100 mg Oral Every 12 hours 03/18/17 1619     03/16/17 1200  doxycycline (VIBRAMYCIN) 100 mg in dextrose 5 % 250 mL IVPB  Status:  Discontinued     100 mg 125 mL/hr over 120 Minutes Intravenous Every 12 hours 03/16/17 1109 03/18/17 1619   03/15/17 0000  vancomycin (VANCOCIN) IVPB 750 mg/150 ml premix  Status:  Discontinued     750 mg 150 mL/hr over 60 Minutes Intravenous Every 12 hours 03/14/17 0904 03/16/17 1121   03/14/17 1800  piperacillin-tazobactam (ZOSYN) IVPB 3.375 g  Status:  Discontinued     3.375 g 12.5 mL/hr over 240 Minutes Intravenous Every 8 hours 03/14/17  1751 03/18/17 1619   03/14/17 0845  piperacillin-tazobactam (ZOSYN) IVPB 3.375 g     3.375 g 100 mL/hr over 30 Minutes Intravenous  Once 03/14/17 0842 03/14/17 1141   03/14/17 0845  vancomycin (VANCOCIN) IVPB 1000 mg/200 mL premix     1,000 mg 200 mL/hr over 60 Minutes Intravenous  Once 03/14/17 0842 03/14/17 1212    03/14/17 0530  vancomycin (VANCOCIN) IVPB 1000 mg/200 mL premix     1,000 mg 200 mL/hr over 60 Minutes Intravenous  Once 03/14/17 0521 03/14/17 0654      Subjective: No complaints at this time  Objective: Vitals:   03/18/17 1501 03/18/17 2157 03/19/17 0434 03/19/17 1308  BP: (!) 104/40 (!) 112/41 (!) 112/40 (!) 111/50  Pulse: 62 (!) 59 61 69  Resp: 16 18 18    Temp: 99.3 F (37.4 C) 97.8 F (36.6 C) 98 F (36.7 C) 98.4 F (36.9 C)  TempSrc: Oral Oral Oral Oral  SpO2: 100% 95% 95% 100%  Weight:      Height:        Intake/Output Summary (Last 24 hours) at 03/19/17 1613 Last data filed at 03/19/17 1549  Gross per 24 hour  Intake             2300 ml  Output             3650 ml  Net            -1350 ml   Filed Weights   03/13/17 1415  Weight: 102.1 kg (225 lb)    Examination: General exam: Conversant, in no acute distress Respiratory system: normal chest rise, clear, no audible wheezing Cardiovascular system: regular rhythm, s1-s2 Gastrointestinal system: Nondistended, nontender, pos BS Central nervous system: No seizures, no tremors Extremities: No cyanosis, no joint deformities Skin: No rashes, no pallor Psychiatry: Affect normal // no auditory hallucinations    Data Reviewed: I have personally reviewed following labs and imaging studies  CBC:  Recent Labs Lab 03/14/17 0521 03/15/17 0601 03/16/17 0559 03/19/17 0636  WBC 12.5* 9.7 11.5* 10.3  NEUTROABS 9.2*  --   --   --   HGB 11.9* 10.5* 11.3* 11.4*  HCT 36.1* 32.2* 34.3* 35.0*  MCV 75.8* 74.9* 75.9* 75.8*  PLT 326 231 267 025   Basic Metabolic Panel:  Recent Labs Lab 03/15/17 0601 03/16/17 0559 03/17/17 0505 03/18/17 0506 03/19/17 0636 03/19/17 0658  NA 136 138 138 137 134*  --   K 3.6 4.6 3.1* 3.5 3.1*  --   CL 102 99* 96* 98* 96*  --   CO2 25 28 32 30 28  --   GLUCOSE 93 84 82 87 88  --   BUN 21* 23* 23* 22* 24*  --   CREATININE 1.09 1.35* 1.29* 1.21 1.10  --   CALCIUM 7.2* 7.4* 7.4*  7.4* 7.5*  --   MG  --   --   --   --   --  1.4*   GFR: Estimated Creatinine Clearance: 83 mL/min (by C-G formula based on SCr of 1.1 mg/dL). Liver Function Tests:  Recent Labs Lab 03/14/17 0521 03/15/17 0601  AST 23 21  ALT 20 15*  ALKPHOS 155* 104  BILITOT 0.8 1.0  PROT 6.2* 4.8*  ALBUMIN 2.3* 1.7*   No results for input(s): LIPASE, AMYLASE in the last 168 hours. No results for input(s): AMMONIA in the last 168 hours. Coagulation Profile: No results for input(s): INR, PROTIME in the last 168 hours. Cardiac Enzymes:  Recent Labs Lab 03/14/17 1058 03/14/17 1457 03/14/17 2024  TROPONINI <0.03 <0.03 <0.03   BNP (last 3 results) No results for input(s): PROBNP in the last 8760 hours. HbA1C: No results for input(s): HGBA1C in the last 72 hours. CBG:  Recent Labs Lab 03/18/17 1716 03/18/17 2155 03/18/17 2357 03/19/17 0432 03/19/17 0733  GLUCAP 122* 100* 84 92 87   Lipid Profile: No results for input(s): CHOL, HDL, LDLCALC, TRIG, CHOLHDL, LDLDIRECT in the last 72 hours. Thyroid Function Tests: No results for input(s): TSH, T4TOTAL, FREET4, T3FREE, THYROIDAB in the last 72 hours. Anemia Panel: No results for input(s): VITAMINB12, FOLATE, FERRITIN, TIBC, IRON, RETICCTPCT in the last 72 hours. Sepsis Labs: No results for input(s): PROCALCITON, LATICACIDVEN in the last 168 hours.  Recent Results (from the past 240 hour(s))  Culture, blood (routine x 2)     Status: None   Collection Time: 03/14/17 10:30 AM  Result Value Ref Range Status   Specimen Description BLOOD LEFT ANTECUBITAL  Final   Special Requests   Final    BOTTLES DRAWN AEROBIC AND ANAEROBIC Blood Culture adequate volume   Culture   Final    NO GROWTH 5 DAYS Performed at [facility], [address] [place], [place] [address]    Report Status 03/19/2017 FINAL  Final  Culture, blood (routine x 2)     Status: None   Collection Time: 03/14/17 10:58 AM  Result Value Ref Range Status   Specimen  Description BLOOD RIGHT ARM  Final   Special Requests   Final    BOTTLES DRAWN AEROBIC AND ANAEROBIC Blood Culture adequate volume   Culture   Final    NO GROWTH 5 DAYS Performed at [facility], [place] [address], [place], [place] [address]    Report Status 03/19/2017 FINAL  Final     Radiology Studies: Dg Hand Complete Right  Result Date: 03/18/2017 CLINICAL DATA:  66 year old male with a history of hand pain after a fall EXAM: RIGHT HAND - COMPLETE 3+ VIEW COMPARISON:  None. FINDINGS: No acute displaced fracture identified. Mild degenerative changes of the interphalangeal joints. No radiopaque foreign body. No subluxation/dislocation. No focal soft tissue swelling. IMPRESSION: Negative for acute bony abnormality. Electronically Signed   By: Corrie Mckusick D.O.   On: 03/18/2017 15:02    Scheduled Meds: . aliskiren  150 mg Oral QODAY  . amoxicillin-clavulanate  1 tablet Oral Q12H  . aspirin EC  81 mg Oral Daily  . atenolol  25 mg Oral Daily  . atorvastatin  80 mg Oral QPC supper  . collagenase   Topical Daily  . doxycycline  100 mg Oral Q12H  . enoxaparin (LOVENOX) injection  40 mg Subcutaneous Q24H  . folic acid  1 mg Oral Daily  . furosemide  40 mg Intravenous Q12H  . hydrALAZINE  25 mg Oral Q8H  . insulin aspart  0-9 Units Subcutaneous Q4H  . pantoprazole  40 mg Oral Daily  . potassium chloride  40 mEq Oral BID   Continuous Infusions:    LOS: 5 days   Helayne Metsker, Orpah Melter, MD [facility] Pager [phone]  If 7PM-7AM, please contact night-coverage [email or web address] Password [email or web address] 03/19/2017, 4:13 PM

## 2017-03-20 LAB — COMPREHENSIVE METABOLIC PANEL
ALT: 31 U/L (ref 17–63)
AST: 40 U/L (ref 15–41)
Albumin: 1.9 g/dL — ABNORMAL LOW (ref 3.5–5.0)
Alkaline Phosphatase: 138 U/L — ABNORMAL HIGH (ref 38–126)
Anion gap: 7 (ref 5–15)
BUN: 32 mg/dL — ABNORMAL HIGH (ref 6–20)
CHLORIDE: 101 mmol/L (ref 101–111)
CO2: 28 mmol/L (ref 22–32)
Calcium: 7.9 mg/dL — ABNORMAL LOW (ref 8.9–10.3)
Creatinine, Ser: 1.08 mg/dL (ref 0.61–1.24)
Glucose, Bld: 96 mg/dL (ref 65–99)
POTASSIUM: 4.2 mmol/L (ref 3.5–5.1)
Sodium: 136 mmol/L (ref 135–145)
Total Bilirubin: 0.6 mg/dL (ref 0.3–1.2)
Total Protein: 5.7 g/dL — ABNORMAL LOW (ref 6.5–8.1)

## 2017-03-20 LAB — MAGNESIUM: MAGNESIUM: 2 mg/dL (ref 1.7–2.4)

## 2017-03-20 LAB — GLUCOSE, CAPILLARY
GLUCOSE-CAPILLARY: 113 mg/dL — AB (ref 65–99)
GLUCOSE-CAPILLARY: 94 mg/dL (ref 65–99)
Glucose-Capillary: 83 mg/dL (ref 65–99)

## 2017-03-20 MED ORDER — AMOXICILLIN-POT CLAVULANATE 875-125 MG PO TABS: 1.0000 | ORAL_TABLET | Freq: Two times a day (BID) | ORAL | 0 refills | Status: DC

## 2017-03-20 MED ORDER — DOXYCYCLINE HYCLATE 100 MG PO TABS: 100.0000 mg | ORAL_TABLET | Freq: Two times a day (BID) | ORAL | 0 refills | Status: DC

## 2017-03-20 NOTE — Progress Notes (Signed)
Attempted multi times to call report to [facility] place  [phone] answer

## 2017-03-20 NOTE — Clinical Social Work Placement (Signed)
Patient received and accepted bed offer at [facility]. Facility aware of patient's discharge and confirmed bed offer. PTAR contacted, family notified. Patient's RN can call report to [phone], packet complete. CSW signing off, no other needs identified at this time.   CLINICAL SOCIAL WORK PLACEMENT  NOTE  Date:  03/20/2017  Patient Details  Name: Jordan [name] MRN: [id number] Date of Birth: 04-02-1951  Clinical Social Work is seeking post-discharge placement for this patient at the [place] level of care (*CSW will initial, date and re-position this form in  chart as items are completed):  Yes   Patient/family provided with [place] Work Department's list of facilities offering this level of care within the geographic area requested by the patient (or if unable, by the patient's family).  Yes   Patient/family informed of their freedom to choose among providers that offer the needed level of care, that participate in Medicare, Medicaid or managed care program needed by the patient, have an available bed and are willing to accept the patient.  Yes   Patient/family informed of [facility]'s ownership interest in [facility] and [facility], as well as of the fact that they are under no obligation to receive care at these facilities.  PASRR submitted to EDS on       PASRR number received on       Existing PASRR number confirmed on       FL2 transmitted to all facilities in geographic area requested by pt/family on 03/16/17     FL2 transmitted to all facilities within larger geographic area on       Patient informed that his/her managed care company has contracts with or will negotiate with certain facilities, including the following:        Yes   Patient/family informed of bed offers received.  Patient chooses bed at [facility]     Physician recommends and patient chooses bed at      Patient to be transferred to [facility]  on 03/20/17.  Patient to be transferred to facility by PTAR     Patient family notified on 03/20/17 of transfer.  Name of family member notified:  Bradd Canary     PHYSICIAN       Additional Comment:    _______________________________________________ Burnis Medin, LCSW 03/20/2017, 4:28 PM

## 2017-03-20 NOTE — Progress Notes (Signed)
CSW contacted [facility] and spoke with staff member Colletta [place] to confirm patient's bed offer. Staff reported that they were unable to accept patient. CSW notified patient, patient reported that his second choice was [facility]. CSW agreed to follow up with [facility].  Jordan [name], [place] Social Worker [facility] Cell#: [phone]

## 2017-03-20 NOTE — Discharge Summary (Addendum)
Physician Discharge Summary  Jordan [name] ZOX:[id number] DOB: Oct 29, 1950 DOA: 03/14/2017  PCP: [name], MD  Admit date: 03/14/2017 Discharge date: 03/20/2017  Admitted From: Home Disposition:  SNF  Recommendations for Outpatient Follow-up:  1. Follow up with PCP in 1-2 weeks 2. Recommend follow up with Vascular Surgery in 1-2 weeks  Discharge Condition:Improved CODE STATUS:Full Diet recommendation: Heart healthy   Brief/Interim Summary: 66 year old male with a history of gastric esophageal reflux, COPD, diabetes, peripheral artery disease, morbid obesity, who presents to the ED via EMS, because of increasing lethargy, increasing falls over the last 2 weeks, worsening bilateral lower extremity edema with weeping and foul-smelling discharge. Patient was recently started on doxycycline but continued to have foul-smelling drainage from both his legs. Patient also complains of left knee pain and right shoulder pain. Patient saw an orthopedic surgeon for his left knee about 3 or 4 weeks ago and corticosteroid injection in the knee. Patient has noticed bilateral lower extremity erythema with worsening edema. Denies any fever, chills,rigors.He has been cleaning his legs with peroxide daily.  Cellulitis of left, right leg; -Initially started on IV vancomycin and zosyn (for broad-spectrum coverage due to diabetes, chronic venous insufficiency) -Patient to keep leg elevated while at rest -Continue on scheduled lasix -ECHO reviewed with normal LVEF, no wall motion abnormality. -Patient would benefit from lymphedema management and outpatient follow-up -Patient is known to the vascular surgery service. Recommend close follow-up -Wound ostomy consulted. Appreciate input -Giving rising Cr, had initially been transitioned to doxycycline IV plus zosyn. -Wound appears improving. To complete course of doxycycline and Augmentin on discharge  Essential hypertension, benign Continue  hydralazine ,atenolol, in addition to his tekturna and lasix.  -Blood pressure presently stable.  -Stable at this time   Diabetes mellitus,- check CBG,started SSI -Hemoglobin A1c 5.7, suggesting good control -Currently stable   PAD (peripheral artery disease) - Continue aspirin, check ABI  -Patient known to vascular surgery service. Will have patient follow-up closely -Currently stable  Hyperlipidemia  - Continue statin  - Currently stable  R hand pain -reports trauma to R hand several weeks ago -R hand mildly swollen, tender -ordered and reviewed hand xray. No acute process  Unstageable pressureinjury to sacrum and buttocks, complicated by urinary incontinence  Intertriginous dermatitis (incontinence moisture associated skin damage) to inguinal folds and perineal area, partial thickness   Discharge Diagnoses:  Principal Problem:   Cellulitis of leg, left Active Problems:   Essential hypertension, benign   Edema   Diabetes (HCC)   Severe obesity (BMI >= 40) (HCC)   PAD (peripheral artery disease) (HCC)   Chronic venous insufficiency   Cellulitis of right leg   Cellulitis, leg   Pressure injury of skin    Discharge Instructions   Allergies as of 03/20/2017      Reactions   Altace [ramipril]    Dizziness, migraine, weakness   Codeine Itching   Naproxen Itching      Medication List    STOP taking these medications   doxycycline 50 MG capsule Commonly known as:  VIBRAMYCIN Replaced by:  doxycycline 100 MG tablet   LORazepam 1 MG tablet Commonly known as:  ATIVAN   methocarbamol 500 MG tablet Commonly known as:  ROBAXIN   perphenazine 2 MG tablet Commonly known as:  TRILAFON   promethazine 25 MG tablet Commonly known as:  PHENERGAN     TAKE these medications   aliskiren 300 MG tablet Commonly known as:  TEKTURNA Take 150 mg by mouth every other  day.   amoxicillin-clavulanate 875-125 MG tablet Commonly known as:  AUGMENTIN Take 1  tablet by mouth every 12 (twelve) hours.   aspirin 81 MG tablet Take 81 mg by mouth daily.   atenolol 25 MG tablet Commonly known as:  TENORMIN Take 1 tablet (25 mg total) by mouth daily. What changed:  Another medication with the same name was removed. Continue taking this medication, and follow the directions you see here.   atorvastatin 80 MG tablet Commonly known as:  LIPITOR Take 80 mg by mouth daily after supper.   butalbital-acetaminophen-caffeine 50-325-40 MG tablet Commonly known as:  FIORICET, ESGIC Take 1 tablet by mouth 2 (two) times daily as needed for pain or headache.   doxycycline 100 MG tablet Commonly known as:  VIBRA-TABS Take 1 tablet (100 mg total) by mouth every 12 (twelve) hours. Replaces:  doxycycline 50 MG capsule   folic acid 1 MG tablet Commonly known as:  FOLVITE Take 1 tablet (1 mg total) by mouth daily.   furosemide 40 MG tablet Commonly known as:  LASIX Take 40 mg by mouth daily as needed for fluid.   hydrALAZINE 25 MG tablet Commonly known as:  APRESOLINE Take 1 tablet (25 mg total) by mouth every 8 (eight) hours.   multivitamin with minerals Tabs tablet Take 1 tablet by mouth daily.   naphazoline-glycerin 0.012-0.2 % Soln Commonly known as:  CLEAR EYES Place 1 drop into both eyes daily.   omeprazole 20 MG capsule Commonly known as:  PRILOSEC Take 20 mg by mouth daily.   venlafaxine XR 75 MG 24 hr capsule Commonly known as:  EFFEXOR-XR Take 150 mg by mouth daily after supper. BRAND NAME ONLY      Follow-up Information    [name], MD. Schedule an appointment as soon as possible for a visit in 1 week(s).   Specialty:  Endocrinology Contact information: [place] [address] [phone]        [facility]. Schedule an appointment as soon as possible for a visit in 1 week(s).   Why:  for follow up of LE wounds. Patient former patient of Dr. Reather Laurence information: [place] [address] [phone]         Allergies  Allergen Reactions  . Altace [Ramipril]     Dizziness, migraine, weakness  . Codeine Itching  . Naproxen Itching    Procedures/Studies: Dg Hand Complete Right  Result Date: 03/18/2017 CLINICAL DATA:  66 year old male with a history of hand pain after a fall EXAM: RIGHT HAND - COMPLETE 3+ VIEW COMPARISON:  None. FINDINGS: No acute displaced fracture identified. Mild degenerative changes of the interphalangeal joints. No radiopaque foreign body. No subluxation/dislocation. No focal soft tissue swelling. IMPRESSION: Negative for acute bony abnormality. Electronically Signed   By: Corrie Mckusick D.O.   On: 03/18/2017 15:02    Subjective: No complaints  Discharge Exam: Vitals:   03/20/17 0426 03/20/17 1257  BP: (!) 125/50 (!) 121/55  Pulse: 64   Resp: 20   Temp: 98.4 F (36.9 C)   SpO2: 96%    Vitals:   03/19/17 1308 03/19/17 2030 03/20/17 0426 03/20/17 1257  BP: (!) 111/50 (!) 102/44 (!) 125/50 (!) 121/55  Pulse: 69 68 64   Resp:  20 20   Temp: 98.4 F (36.9 C) 99.3 F (37.4 C) 98.4 F (36.9 C)   TempSrc: Oral Oral Oral   SpO2: 100% 99% 96%   Weight:  Height:        General: Pt is alert, awake, not in acute distress Cardiovascular: RRR, S1/S2 +, no rubs, no gallops Respiratory: CTA bilaterally, no wheezing, no rhonchi Abdominal: Soft, NT, ND, bowel sounds + Extremities: no edema, no cyanosis   The results of significant diagnostics from this hospitalization (including imaging, microbiology, ancillary and laboratory) are listed below for reference.     Microbiology: Recent Results (from the past 240 hour(s))  Culture, blood (routine x 2)     Status: None   Collection Time: 03/14/17 10:30 AM  Result Value Ref Range Status   Specimen Description BLOOD LEFT ANTECUBITAL  Final   Special Requests   Final    BOTTLES DRAWN AEROBIC AND ANAEROBIC Blood Culture adequate volume   Culture   Final     NO GROWTH 5 DAYS Performed at [facility], [address] [place], [place] [address]    Report Status 03/19/2017 FINAL  Final  Culture, blood (routine x 2)     Status: None   Collection Time: 03/14/17 10:58 AM  Result Value Ref Range Status   Specimen Description BLOOD RIGHT ARM  Final   Special Requests   Final    BOTTLES DRAWN AEROBIC AND ANAEROBIC Blood Culture adequate volume   Culture   Final    NO GROWTH 5 DAYS Performed at [facility], [place] [address], [place], [place] [address]    Report Status 03/19/2017 FINAL  Final     Labs: BNP (last 3 results) No results for input(s): BNP in the last 8760 hours. Basic Metabolic Panel:  Recent Labs Lab 03/16/17 0559 03/17/17 0505 03/18/17 0506 03/19/17 0636 03/19/17 0658 03/20/17 0515  NA 138 138 137 134*  --  136  K 4.6 3.1* 3.5 3.1*  --  4.2  CL 99* 96* 98* 96*  --  101  CO2 28 32 30 28  --  28  GLUCOSE 84 82 87 88  --  96  BUN 23* 23* 22* 24*  --  32*  CREATININE 1.35* 1.29* 1.21 1.10  --  1.08  CALCIUM 7.4* 7.4* 7.4* 7.5*  --  7.9*  MG  --   --   --   --  1.4* 2.0   Liver Function Tests:  Recent Labs Lab 03/14/17 0521 03/15/17 0601 03/20/17 0515  AST 23 21 40  ALT 20 15* 31  ALKPHOS 155* 104 138*  BILITOT 0.8 1.0 0.6  PROT 6.2* 4.8* 5.7*  ALBUMIN 2.3* 1.7* 1.9*   No results for input(s): LIPASE, AMYLASE in the last 168 hours. No results for input(s): AMMONIA in the last 168 hours. CBC:  Recent Labs Lab 03/14/17 0521 03/15/17 0601 03/16/17 0559 03/19/17 0636  WBC 12.5* 9.7 11.5* 10.3  NEUTROABS 9.2*  --   --   --   HGB 11.9* 10.5* 11.3* 11.4*  HCT 36.1* 32.2* 34.3* 35.0*  MCV 75.8* 74.9* 75.9* 75.8*  PLT 326 231 267 291   Cardiac Enzymes:  Recent Labs Lab 03/14/17 1058 03/14/17 1457 03/14/17 2024  TROPONINI <0.03 <0.03 <0.03   BNP: Invalid input(s): POCBNP CBG:  Recent Labs Lab 03/19/17 1217 03/19/17 1552 03/19/17 2029 03/20/17 0008 03/20/17 0419  GLUCAP 105*  101* 84 113* 94   D-Dimer No results for input(s): DDIMER in the last 72 hours. Hgb A1c No results for input(s): HGBA1C in the last 72 hours. Lipid Profile No results for input(s): CHOL, HDL, LDLCALC, TRIG, CHOLHDL, LDLDIRECT in the last 72  hours. Thyroid function studies No results for input(s): TSH, T4TOTAL, T3FREE, THYROIDAB in the last 72 hours.  Invalid input(s): FREET3 Anemia work up No results for input(s): VITAMINB12, FOLATE, FERRITIN, TIBC, IRON, RETICCTPCT in the last 72 hours. Urinalysis No results found for: COLORURINE, APPEARANCEUR, [place], [place], [place], [place], [place], [place], PROTEINUR, UROBILINOGEN, NITRITE, LEUKOCYTESUR Sepsis Labs Invalid input(s): PROCALCITONIN,  WBC,  LACTICIDVEN Microbiology Recent Results (from the past 240 hour(s))  Culture, blood (routine x 2)     Status: None   Collection Time: 03/14/17 10:30 AM  Result Value Ref Range Status   Specimen Description BLOOD LEFT ANTECUBITAL  Final   Special Requests   Final    BOTTLES DRAWN AEROBIC AND ANAEROBIC Blood Culture adequate volume   Culture   Final    NO GROWTH 5 DAYS Performed at [facility], [address] [place], [place] [address]    Report Status 03/19/2017 FINAL  Final  Culture, blood (routine x 2)     Status: None   Collection Time: 03/14/17 10:58 AM  Result Value Ref Range Status   Specimen Description BLOOD RIGHT ARM  Final   Special Requests   Final    BOTTLES DRAWN AEROBIC AND ANAEROBIC Blood Culture adequate volume   Culture   Final    NO GROWTH 5 DAYS Performed at [facility], [place] [address], [place], [place] [address]    Report Status 03/19/2017 FINAL  Final     SIGNED:   Donne Hazel, MD  [facility] 03/20/2017, 2:11 PM  If 7PM-7AM, please contact night-coverage [email or web address] Password [email or web address]

## 2017-03-20 NOTE — Progress Notes (Signed)
Pt d/ced to SNF [facility]. Attempted to call report x2 with no answer. PTAR aware.

## 2017-03-21 LAB — GLUCOSE, CAPILLARY
GLUCOSE-CAPILLARY: 90 mg/dL (ref 65–99)
GLUCOSE-CAPILLARY: 123 mg/dL — AB (ref 65–99)

## 2017-03-23 ENCOUNTER — Other Ambulatory Visit: Payer: Self-pay

## 2017-03-23 DIAGNOSIS — L97909 Non-pressure chronic ulcer of unspecified part of unspecified lower leg with unspecified severity: Principal | ICD-10-CM

## 2017-03-23 DIAGNOSIS — I83009 Varicose veins of unspecified lower extremity with ulcer of unspecified site: Secondary | ICD-10-CM

## 2017-04-25 ENCOUNTER — Ambulatory Visit: Payer: Medicare Other | Admitting: Vascular Surgery

## 2017-04-25 ENCOUNTER — Encounter (HOSPITAL_COMMUNITY): Payer: Medicare Other

## 2017-10-31 ENCOUNTER — Encounter (HOSPITAL_BASED_OUTPATIENT_CLINIC_OR_DEPARTMENT_OTHER): Payer: Medicare Other | Attending: Internal Medicine

## 2017-10-31 DIAGNOSIS — I10 Essential (primary) hypertension: Secondary | ICD-10-CM | POA: Diagnosis not present

## 2017-10-31 DIAGNOSIS — Z7984 Long term (current) use of oral hypoglycemic drugs: Secondary | ICD-10-CM | POA: Insufficient documentation

## 2017-10-31 DIAGNOSIS — I739 Peripheral vascular disease, unspecified: Secondary | ICD-10-CM | POA: Insufficient documentation

## 2017-10-31 DIAGNOSIS — I87331 Chronic venous hypertension (idiopathic) with ulcer and inflammation of right lower extremity: Secondary | ICD-10-CM | POA: Insufficient documentation

## 2017-10-31 DIAGNOSIS — L97811 Non-pressure chronic ulcer of other part of right lower leg limited to breakdown of skin: Secondary | ICD-10-CM | POA: Insufficient documentation

## 2017-10-31 DIAGNOSIS — G4730 Sleep apnea, unspecified: Secondary | ICD-10-CM | POA: Diagnosis not present

## 2017-10-31 DIAGNOSIS — I890 Lymphedema, not elsewhere classified: Secondary | ICD-10-CM | POA: Diagnosis not present

## 2017-11-07 DIAGNOSIS — I87331 Chronic venous hypertension (idiopathic) with ulcer and inflammation of right lower extremity: Secondary | ICD-10-CM | POA: Diagnosis not present

## 2018-03-14 ENCOUNTER — Encounter (HOSPITAL_BASED_OUTPATIENT_CLINIC_OR_DEPARTMENT_OTHER): Payer: Medicare Other

## 2019-04-19 ENCOUNTER — Ambulatory Visit
Admission: RE | Admit: 2019-04-19 | Discharge: 2019-04-19 | Disposition: A | Discharge status: Home / Self Care | Payer: Medicare Other | Source: Ambulatory Visit | Attending: Adult Health | Admitting: Adult Health

## 2019-04-19 ENCOUNTER — Other Ambulatory Visit: Payer: Self-pay | Admitting: Adult Health

## 2019-04-19 DIAGNOSIS — L97509 Non-pressure chronic ulcer of other part of unspecified foot with unspecified severity: Secondary | ICD-10-CM

## 2019-04-19 DIAGNOSIS — E11621 Type 2 diabetes mellitus with foot ulcer: Secondary | ICD-10-CM

## 2019-04-19 DIAGNOSIS — E1169 Type 2 diabetes mellitus with other specified complication: Secondary | ICD-10-CM

## 2019-04-25 ENCOUNTER — Encounter: Payer: Self-pay | Admitting: Orthopedic Surgery

## 2019-04-25 ENCOUNTER — Ambulatory Visit: Payer: Medicare Other | Admitting: Orthopedic Surgery

## 2019-04-25 VITALS — Ht 73.0 in | Wt 225.0 lb

## 2019-04-25 DIAGNOSIS — M869 Osteomyelitis, unspecified: Secondary | ICD-10-CM | POA: Diagnosis not present

## 2019-04-25 DIAGNOSIS — I872 Venous insufficiency (chronic) (peripheral): Secondary | ICD-10-CM | POA: Diagnosis not present

## 2019-04-25 DIAGNOSIS — E43 Unspecified severe protein-calorie malnutrition: Secondary | ICD-10-CM | POA: Diagnosis not present

## 2019-04-26 ENCOUNTER — Encounter: Payer: Self-pay | Admitting: Orthopedic Surgery

## 2019-04-26 NOTE — Progress Notes (Signed)
Office Visit Note   Patient: Jordan [name]           Date of Birth: 1950/09/25           MRN: [id number] Visit Date: 04/25/2019              Requested by: [name], MD [address] [place],  [place] [address] PCP: [name], MD  Chief Complaint  Patient presents with   Right Foot - Pain   Left Foot - Pain      HPI: Patient is a 68 year old gentleman who is seen for initial evaluation in referral from Dr. Forde Dandy.  Patient has ulcers on both great toes with massive venous stasis swelling in both lower extremities.  Patient ambulates with a cane and slippers.  Patient is currently on doxycycline twice a day and Lasix 40 mg a day.  Assessment & Plan: Visit Diagnoses:  1. Osteomyelitis of great toe of right foot ([place])     Plan: Patient has massive venous stasis swelling both lower extremities.  Recommended a double extra-large compression stocking.  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  Discussed that he is not septic there is no drainage no acute need to proceed with surgery at this time.  Recommended smoking cessation and we will reevaluate in 2 weeks.  Recommended protein supplement with whey-based powder.  Follow-Up Instructions: No follow-ups on file.   Ortho Exam  Patient is alert, oriented, no adenopathy, well-dressed, normal affect, normal respiratory effort. Examination patient has massive brawny skin color changes in both lower extremities his right calf is 44 cm in circumference left calf is 49 cm in circumference.  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  Left great toe he does have an a blister ulcer which is 1 cm x 0.1 mm the bone shows  no obvious osteomyelitis.  Albumin 1.9 with severe protein caloric malnutrition.  Imaging: No results found. No images are attached to the encounter.  Labs: Lab Results  Component Value Date   HGBA1C 5.7 (H) 03/14/2017   HGBA1C 5.0 10/30/2014   ESRSEDRATE 28 (H) 10/30/2014   CRP 6.5 (H) 10/30/2014   REPTSTATUS 03/19/2017 FINAL 03/14/2017   CULT  03/14/2017    NO GROWTH 5 DAYS Performed at [facility], [place] [address], [place], [place] [address]      Lab Results  Component Value Date   ALBUMIN 1.9 (L) 03/20/2017   ALBUMIN 1.7 (L) 03/15/2017   ALBUMIN 2.3 (L) 03/14/2017   PREALBUMIN 9.4 (L) 10/30/2014    Lab Results  Component Value Date   MG 2.0 03/20/2017   MG 1.4 (L) 03/19/2017   No results found for: [facility]  Lab Results  Component Value Date   PREALBUMIN 9.4 (L) 10/30/2014   CBC EXTENDED Latest Ref Rng & Units 03/19/2017 03/16/2017 03/15/2017  WBC 4.0 - 10.5 K/uL 10.3 11.5(H) 9.7  RBC 4.22 - 5.81 MIL/uL 4.62 4.52 4.30  HGB 13.0 - 17.0 g/dL 11.4(L) 11.3(L) 10.5(L)  HCT 39.0 - 52.0 % 35.0(L) 34.3(L) 32.2(L)  PLT 150 - 400 K/uL 291 267 231  NEUTROABS 1.7 - 7.7 K/uL - - -  LYMPHSABS 0.7 - 4.0 K/uL - - -  Body mass index is 29.69 kg/m.  Orders:  No orders of the defined types were placed in this encounter.  No orders of the defined types were placed in this encounter.    Procedures: No procedures performed  Clinical Data: No additional findings.  ROS:  All other systems negative, except as noted in the HPI. Review of Systems  Objective: Vital Signs: Ht 6\' 1"  (1.854 m)    Wt 225 lb (102.1 kg)    BMI 29.69 kg/m   Specialty Comments:  No specialty comments available.  PMFS History: Patient Active Problem List   Diagnosis Date Noted   Pressure injury of skin 03/15/2017   Cellulitis, leg 03/14/2017   Cellulitis of both feet    Cellulitis of right leg 10/30/2014   Chronic venous insufficiency 04/28/2014   PAD (peripheral artery  disease) ([place]) 01/21/2014   Varicose veins of lower extremities with other complications 99991111   Diabetes ([place]) 12/31/2013   Hx of TIA (transient ischemic attack) and stroke 12/31/2013   Other and unspecified hyperlipidemia 12/31/2013   Severe obesity (BMI >= 40) ([place]) 12/31/2013   Cellulitis of leg, left 09/13/2012   272.4 09/13/2012   Essential hypertension, benign 09/13/2012   Edema 09/13/2012   Past Medical History:  Diagnosis Date   Anxiety    Arthritis    COPD (chronic obstructive pulmonary disease) (HCC)    Depression    Diabetes mellitus without complication (HCC)    GERD (gastroesophageal reflux disease)    Hyperlipidemia    Hypertension    Migraine    Obesity    Sleep apnea    CPAP    Varicose veins     Family History  Problem Relation Age of Onset   Hypertension Mother    Alzheimer's disease Mother    Stroke Father        x 3   Heart attack Father    Congestive Heart Failure Father    Diabetes Unknown        mat great aunts   Colon cancer Neg Hx    Rectal cancer Neg Hx    Esophageal cancer Neg Hx     Past Surgical History:  Procedure Laterality Date   ANKLE SURGERY Right    Dr. Marcelino Scot, has pins   KNEE ARTHROSCOPY     Dr. Noemi Chapel   right foot surgery,rt great toe callus removal     TONSILLECTOMY AND ADENOIDECTOMY     age 26   Social History   Occupational History   Occupation: retired Youth worker: RETIRED  Tobacco Use   Smoking status: Current Some Day Smoker    Packs/day: 2.00    Years: 20.00    Pack years: 40.00    Types: Cigarettes   Smokeless tobacco: Never Used  Substance and Sexual Activity   Alcohol use: No    Alcohol/week: 0.0 standard drinks   Drug use: No   Sexual activity: Yes    Birth control/protection: Condom

## 2019-04-30 ENCOUNTER — Telehealth: Payer: Self-pay | Admitting: Orthopedic Surgery

## 2019-04-30 NOTE — Telephone Encounter (Signed)
Patient left a voicemail wanting to speak with you in regards to an RX for the compression anklets so that his insurance will pay for them.  CB#[phone] or [phone].  Thank you.

## 2019-05-02 NOTE — Telephone Encounter (Signed)
I called pt and there was no answer and the mail box was full. I will hold message and try again.

## 2019-05-07 NOTE — Telephone Encounter (Signed)
Patient has an appt on Thursday 05/09/2019 and will take care of patient's needs when he come in the office.

## 2019-05-09 ENCOUNTER — Encounter: Payer: Self-pay | Admitting: Orthopedic Surgery

## 2019-05-09 ENCOUNTER — Other Ambulatory Visit: Payer: Self-pay

## 2019-05-09 ENCOUNTER — Ambulatory Visit: Payer: Medicare Other | Admitting: Orthopedic Surgery

## 2019-05-09 VITALS — Ht 73.0 in | Wt 225.0 lb

## 2019-05-09 DIAGNOSIS — I872 Venous insufficiency (chronic) (peripheral): Secondary | ICD-10-CM

## 2019-05-10 ENCOUNTER — Encounter: Payer: Self-pay | Admitting: Orthopedic Surgery

## 2019-05-10 NOTE — Progress Notes (Signed)
Office Visit Note   Patient: Jordan [name]           Date of Birth: [birth year]           MRN: [id number] Visit Date: 05/09/2019              Requested by: [name], [place],  [place] [address] PCP: [name], MD  Chief Complaint  Patient presents with  . Left Leg - Follow-up  . Right Leg - Follow-up      HPI: The patient is here today for follow-up on his bilateral venous insufficiency in his lower legs.  At his last visit he was given a prescription for VIVE socks.  He did attempt to get these however they had to be ordered as they did not have his size.  He expects to get them in the next week or 2.  In the meantime he has been wearing some circumferential wraps that he obtained a while ago at the wound center.  He admits they do not offer much compression  Assessment & Plan: Visit Diagnoses: No diagnosis found.  Plan: We will place him in Dynaflex wraps today that will be changed once a week until he can obtain his compression socks.  He was in agreement with this plan  Follow-Up Instructions: No follow-ups on file.   Ortho Exam  Patient is alert, oriented, no adenopathy, well-dressed, normal affect, normal respiratory effort.  Bilateral lower extremities significant soft tissue swelling left greater than right with associated venous insufficiency changes.  There are no open ulcers or drainage  Imaging: No results found. No images are attached to the encounter.  Labs: Lab Results  Component Value Date   HGBA1C 5.7 (H) 03/14/2017   HGBA1C 5.0 10/30/2014   ESRSEDRATE 28 (H) 10/30/2014   CRP 6.5 (H) 10/30/2014   REPTSTATUS 03/19/2017 FINAL 03/14/2017   CULT  03/14/2017    NO GROWTH 5 DAYS Performed at [facility], [place] [address] [place], [place] [address]      Lab Results  Component Value Date   ALBUMIN 1.9 (L) 03/20/2017   ALBUMIN 1.7 (L) 03/15/2017   ALBUMIN 2.3 (L) 03/14/2017   PREALBUMIN 9.4 (L) 10/30/2014     Lab Results  Component Value Date   MG 2.0 03/20/2017   MG 1.4 (L) 03/19/2017   No results found for: [facility]  Lab Results  Component Value Date   PREALBUMIN 9.4 (L) 10/30/2014   CBC EXTENDED Latest Ref Rng & Units 03/19/2017 03/16/2017 03/15/2017  WBC 4.0 - 10.5 K/uL 10.3 11.5(H) 9.7  RBC 4.22 - 5.81 MIL/uL 4.62 4.52 4.30  HGB 13.0 - 17.0 g/dL 11.4(L) 11.3(L) 10.5(L)  HCT 39.0 - 52.0 % 35.0(L) 34.3(L) 32.2(L)  PLT 150 - 400 K/uL 291 267 231  NEUTROABS 1.7 - 7.7 K/uL - - -  LYMPHSABS 0.7 - 4.0 K/uL - - -     Body mass index is 29.69 kg/m.  Orders:  No orders of the defined types were placed in this encounter.  No orders of the defined types were placed in this encounter.    Procedures: No procedures performed  Clinical Data: No additional findings.  ROS:  All other systems negative, except as noted in the HPI. Review of Systems  Objective: Vital Signs: Ht 6\' 1"  (1.854 m)   Wt 225 lb (102.1 kg)   BMI 29.69 kg/m   Specialty Comments:  No specialty comments available.  PMFS History: Patient Active Problem  List   Diagnosis Date Noted  . Pressure injury of skin 03/15/2017  . Cellulitis, leg 03/14/2017  . Cellulitis of both feet   . Cellulitis of right leg 10/30/2014  . Chronic venous insufficiency 04/28/2014  . PAD (peripheral artery disease) ([place]) 01/21/2014  . Varicose veins of lower extremities with other complications 99991111  . Diabetes ([place]) 12/31/2013  . Hx of TIA (transient ischemic attack) and stroke 12/31/2013  . Other and unspecified hyperlipidemia 12/31/2013  . Severe obesity (BMI >= 40) ([place]) 12/31/2013  . Cellulitis of leg, left 09/13/2012  . 272.4 09/13/2012  . Essential hypertension, benign 09/13/2012  . Edema 09/13/2012   Past Medical History:  Diagnosis Date  . Anxiety   . Arthritis   . COPD (chronic obstructive pulmonary disease) ([place])   . Depression   . Diabetes mellitus without complication ([place])   . GERD (gastroesophageal  reflux disease)   . Hyperlipidemia   . Hypertension   . Migraine   . Obesity   . Sleep apnea    CPAP   . Varicose veins     Family History  Problem Relation Age of Onset  . Hypertension Mother   . Alzheimer's disease Mother   . Stroke Father        x 3  . Heart attack Father   . Congestive Heart Failure Father   . Diabetes Unknown        mat great aunts  . Colon cancer Neg Hx   . Rectal cancer Neg Hx   . Esophageal cancer Neg Hx     Past Surgical History:  Procedure Laterality Date  . ANKLE SURGERY Right    Dr. Marcelino Scot, has pins  . KNEE ARTHROSCOPY     Dr. Noemi Chapel  . right foot surgery,rt great toe callus removal    . TONSILLECTOMY AND ADENOIDECTOMY     age 66   Social History   Occupational History  . Occupation: retired Youth worker: RETIRED  Tobacco Use  . Smoking status: Current Some Day Smoker    Packs/day: 2.00    Years: 20.00    Pack years: 40.00    Types: Cigarettes  . Smokeless tobacco: Never Used  Substance and Sexual Activity  . Alcohol use: No    Alcohol/week: 0.0 standard drinks  . Drug use: No  . Sexual activity: Yes    Birth control/protection: Condom

## 2019-05-23 ENCOUNTER — Other Ambulatory Visit: Payer: Self-pay

## 2019-05-23 ENCOUNTER — Ambulatory Visit: Payer: Medicare Other | Admitting: Orthopedic Surgery

## 2019-05-23 ENCOUNTER — Encounter: Payer: Self-pay | Admitting: Orthopedic Surgery

## 2019-05-23 VITALS — Ht 73.0 in | Wt 225.0 lb

## 2019-05-23 DIAGNOSIS — I872 Venous insufficiency (chronic) (peripheral): Secondary | ICD-10-CM | POA: Diagnosis not present

## 2019-05-23 NOTE — Progress Notes (Signed)
Office Visit Note   Patient: Jordan [name]           Date of Birth: May 28, 1951           MRN: [id number] Visit Date: 05/23/2019              Requested by: [name], [place],  [place] [address] PCP: [name], MD  Chief Complaint  Patient presents with  . Left Leg - Pain, Follow-up  . Right Leg - Pain, Follow-up      HPI: Bilateral Venous Stasis Dermatitis. 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 He has been having Dynaflex wraps 1x weekly until he can get his VIVE Socks. He has had some difficulty ordering the socks and is going to get them this week  Assessment & Plan: Visit Diagnoses: No diagnosis found.  Plan: Dynaflex wraps today. He may discontinue the doxycycline. He understands he does have osteo of the great toe and at some point will need to have the phalanx amputated.   Follow-Up Instructions: No follow-ups on file.   Ortho Exam  Patient is alert, oriented, no adenopathy, well-dressed, normal affect, normal respiratory effort. Bilateral venous stasis dermatitis. No open ulcers or skin breakdown. Right great toe shoes no induration no cellulitis, no foul odor, no drainage  Imaging: No results found. No images are attached to the encounter.  Labs: Lab Results  Component Value Date   HGBA1C 5.7 (H) 03/14/2017   HGBA1C 5.0 10/30/2014   ESRSEDRATE 28 (H) 10/30/2014   CRP 6.5 (H) 10/30/2014   REPTSTATUS 03/19/2017 FINAL 03/14/2017   CULT  03/14/2017    NO GROWTH 5 DAYS Performed at [facility], [place] [address] [place], [place] [address]      Lab Results  Component Value Date   ALBUMIN 1.9 (L) 03/20/2017   ALBUMIN 1.7 (L) 03/15/2017   ALBUMIN 2.3 (L) 03/14/2017   PREALBUMIN 9.4 (L) 10/30/2014    Lab Results  Component Value Date   MG 2.0 03/20/2017   MG 1.4 (L) 03/19/2017   No results found for: [facility]  Lab Results  Component Value Date   PREALBUMIN 9.4 (L) 10/30/2014   CBC EXTENDED  Latest Ref Rng & Units 03/19/2017 03/16/2017 03/15/2017  WBC 4.0 - 10.5 K/uL 10.3 11.5(H) 9.7  RBC 4.22 - 5.81 MIL/uL 4.62 4.52 4.30  HGB 13.0 - 17.0 g/dL 11.4(L) 11.3(L) 10.5(L)  HCT 39.0 - 52.0 % 35.0(L) 34.3(L) 32.2(L)  PLT 150 - 400 K/uL 291 267 231  NEUTROABS 1.7 - 7.7 K/uL - - -  LYMPHSABS 0.7 - 4.0 K/uL - - -     Body mass index is 29.69 kg/m.  Orders:  No orders of the defined types were placed in this encounter.  No orders of the defined types were placed in this encounter.    Procedures: No procedures performed  Clinical Data: No additional findings.  ROS:  All other systems negative, except as noted in the HPI. Review of Systems  Objective: Vital Signs: Ht 6\' 1"  (1.854 m)   Wt 225 lb (102.1 kg)   BMI 29.69 kg/m   Specialty Comments:  No specialty comments available.  PMFS History: Patient Active Problem List   Diagnosis Date Noted  . Pressure injury of skin 03/15/2017  . Cellulitis, leg 03/14/2017  . Cellulitis of both feet   . Cellulitis of right leg 10/30/2014  . Chronic venous insufficiency 04/28/2014  . PAD (peripheral artery disease) ([place]) 01/21/2014  .  Varicose veins of lower extremities with other complications 99991111  . Diabetes ([place]) 12/31/2013  . Hx of TIA (transient ischemic attack) and stroke 12/31/2013  . Other and unspecified hyperlipidemia 12/31/2013  . Severe obesity (BMI >= 40) ([place]) 12/31/2013  . Cellulitis of leg, left 09/13/2012  . 272.4 09/13/2012  . Essential hypertension, benign 09/13/2012  . Edema 09/13/2012   Past Medical History:  Diagnosis Date  . Anxiety   . Arthritis   . COPD (chronic obstructive pulmonary disease) ([place])   . Depression   . Diabetes mellitus without complication ([place])   . GERD (gastroesophageal reflux disease)   . Hyperlipidemia   . Hypertension   . Migraine   . Obesity   . Sleep apnea    CPAP   . Varicose veins     Family History  Problem Relation Age of Onset  . Hypertension Mother    . Alzheimer's disease Mother   . Stroke Father        x 3  . Heart attack Father   . Congestive Heart Failure Father   . Diabetes Unknown        mat great aunts  . Colon cancer Neg Hx   . Rectal cancer Neg Hx   . Esophageal cancer Neg Hx     Past Surgical History:  Procedure Laterality Date  . ANKLE SURGERY Right    Dr. Marcelino Scot, has pins  . KNEE ARTHROSCOPY     Dr. Noemi Chapel  . right foot surgery,rt great toe callus removal    . TONSILLECTOMY AND ADENOIDECTOMY     age 73   Social History   Occupational History  . Occupation: retired Youth worker: RETIRED  Tobacco Use  . Smoking status: Current Some Day Smoker    Packs/day: 2.00    Years: 20.00    Pack years: 40.00    Types: Cigarettes  . Smokeless tobacco: Never Used  Substance and Sexual Activity  . Alcohol use: No    Alcohol/week: 0.0 standard drinks  . Drug use: No  . Sexual activity: Yes    Birth control/protection: Condom

## 2019-05-30 ENCOUNTER — Ambulatory Visit: Payer: Medicare Other | Admitting: Orthopedic Surgery

## 2019-05-30 ENCOUNTER — Encounter: Payer: Self-pay | Admitting: Orthopedic Surgery

## 2019-05-30 ENCOUNTER — Other Ambulatory Visit: Payer: Self-pay

## 2019-05-30 VITALS — Ht 73.0 in | Wt 225.0 lb

## 2019-05-30 DIAGNOSIS — I872 Venous insufficiency (chronic) (peripheral): Secondary | ICD-10-CM | POA: Diagnosis not present

## 2019-05-30 NOTE — Progress Notes (Signed)
Office Visit Note   Patient: Jordan [name]           Date of Birth: 1950-12-09           MRN: [id number] Visit Date: 05/30/2019              Requested by: [name], [place],  [place] [address] PCP: [name], MD  Chief Complaint  Patient presents with  . Left Leg - Follow-up  . Right Leg - Follow-up      HPI: This is a pleasant gentleman who has a history of bilateral venous stasis dermatitis he had been getting compression wraps.  Last week his swelling had decreased enough that he was given instructions to buy VIVE compression socks unfortunately he was not given instructions on how to put these on and it is been very difficult and he was unable to do so  Assessment & Plan: Visit Diagnoses: No diagnosis found.  Plan: He will be placed in Dynaflex wraps today.  He will follow-up next week and I asked that he bring his compression socks with him and I will teach him how to put them on  Follow-Up Instructions: No follow-ups on file.   Ortho Exam  Patient is alert, oriented, no adenopathy, well-dressed, normal affect, normal respiratory effort. Bilateral lower extremity stasis dermatitis distal pulses are palpable moderate soft tissue swelling no skin breakdown no cellulitis  Imaging: No results found. No images are attached to the encounter.  Labs: Lab Results  Component Value Date   HGBA1C 5.7 (H) 03/14/2017   HGBA1C 5.0 10/30/2014   ESRSEDRATE 28 (H) 10/30/2014   CRP 6.5 (H) 10/30/2014   REPTSTATUS 03/19/2017 FINAL 03/14/2017   CULT  03/14/2017    NO GROWTH 5 DAYS Performed at [facility], [place] [address] [place], [place] [address]      Lab Results  Component Value Date   ALBUMIN 1.9 (L) 03/20/2017   ALBUMIN 1.7 (L) 03/15/2017   ALBUMIN 2.3 (L) 03/14/2017   PREALBUMIN 9.4 (L) 10/30/2014    Lab Results  Component Value Date   MG 2.0 03/20/2017   MG 1.4 (L) 03/19/2017   No results found for: [facility]  Lab Results   Component Value Date   PREALBUMIN 9.4 (L) 10/30/2014   CBC EXTENDED Latest Ref Rng & Units 03/19/2017 03/16/2017 03/15/2017  WBC 4.0 - 10.5 K/uL 10.3 11.5(H) 9.7  RBC 4.22 - 5.81 MIL/uL 4.62 4.52 4.30  HGB 13.0 - 17.0 g/dL 11.4(L) 11.3(L) 10.5(L)  HCT 39.0 - 52.0 % 35.0(L) 34.3(L) 32.2(L)  PLT 150 - 400 K/uL 291 267 231  NEUTROABS 1.7 - 7.7 K/uL - - -  LYMPHSABS 0.7 - 4.0 K/uL - - -     Body mass index is 29.69 kg/m.  Orders:  No orders of the defined types were placed in this encounter.  No orders of the defined types were placed in this encounter.    Procedures: No procedures performed  Clinical Data: No additional findings.  ROS:  All other systems negative, except as noted in the HPI. Review of Systems  Objective: Vital Signs: Ht 6\' 1"  (1.854 m)   Wt 225 lb (102.1 kg)   BMI 29.69 kg/m   Specialty Comments:  No specialty comments available.  PMFS History: Patient Active Problem List   Diagnosis Date Noted  . Pressure injury of skin 03/15/2017  . Cellulitis, leg 03/14/2017  . Cellulitis of both feet   . Cellulitis of right  leg 10/30/2014  . Chronic venous insufficiency 04/28/2014  . PAD (peripheral artery disease) ([place]) 01/21/2014  . Varicose veins of lower extremities with other complications 99991111  . Diabetes ([place]) 12/31/2013  . Hx of TIA (transient ischemic attack) and stroke 12/31/2013  . Other and unspecified hyperlipidemia 12/31/2013  . Severe obesity (BMI >= 40) ([place]) 12/31/2013  . Cellulitis of leg, left 09/13/2012  . 272.4 09/13/2012  . Essential hypertension, benign 09/13/2012  . Edema 09/13/2012   Past Medical History:  Diagnosis Date  . Anxiety   . Arthritis   . COPD (chronic obstructive pulmonary disease) ([place])   . Depression   . Diabetes mellitus without complication ([place])   . GERD (gastroesophageal reflux disease)   . Hyperlipidemia   . Hypertension   . Migraine   . Obesity   . Sleep apnea    CPAP   . Varicose veins       Family History  Problem Relation Age of Onset  . Hypertension Mother   . Alzheimer's disease Mother   . Stroke Father        x 3  . Heart attack Father   . Congestive Heart Failure Father   . Diabetes Unknown        mat great aunts  . Colon cancer Neg Hx   . Rectal cancer Neg Hx   . Esophageal cancer Neg Hx     Past Surgical History:  Procedure Laterality Date  . ANKLE SURGERY Right    Dr. Marcelino Scot, has pins  . KNEE ARTHROSCOPY     Dr. Noemi Chapel  . right foot surgery,rt great toe callus removal    . TONSILLECTOMY AND ADENOIDECTOMY     age 57   Social History   Occupational History  . Occupation: retired Youth worker: RETIRED  Tobacco Use  . Smoking status: Current Some Day Smoker    Packs/day: 2.00    Years: 20.00    Pack years: 40.00    Types: Cigarettes  . Smokeless tobacco: Never Used  Substance and Sexual Activity  . Alcohol use: No    Alcohol/week: 0.0 standard drinks  . Drug use: No  . Sexual activity: Yes    Birth control/protection: Condom

## 2019-06-06 ENCOUNTER — Encounter: Payer: Self-pay | Admitting: Orthopedic Surgery

## 2019-06-06 ENCOUNTER — Other Ambulatory Visit: Payer: Self-pay

## 2019-06-06 ENCOUNTER — Ambulatory Visit: Payer: Medicare Other | Admitting: Orthopedic Surgery

## 2019-06-06 VITALS — Ht 73.0 in | Wt 225.0 lb

## 2019-06-06 DIAGNOSIS — I872 Venous insufficiency (chronic) (peripheral): Secondary | ICD-10-CM

## 2019-06-07 ENCOUNTER — Encounter: Payer: Self-pay | Admitting: Orthopedic Surgery

## 2019-06-07 NOTE — Progress Notes (Signed)
Office Visit Note   Patient: Jordan [name]           Date of Birth: 1950/11/19           MRN: [id number] Visit Date: 06/06/2019              Requested by: [name], [place],  [place] [address] PCP: [name], MD  Chief Complaint  Patient presents with  . Left Leg - Follow-up  . Right Leg - Follow-up      HPI: This is a pleasant gentleman who presents today for follow-up on his bilateral venous stasis disease he is actually doing well he has no ulcers.  He has been doing Dynaflex wraps but is transitioning toVIVE compression stockings  Assessment & Plan: Visit Diagnoses: No diagnosis found.  Plan: He will follow up in 1 month.  I explained to him how to use the compression stockings and demonstrated and helped him place them today.  He said he is going to try to get them on and may have someone that can help him  Follow-Up Instructions: No follow-ups on file.   Ortho Exam  Patient is alert, oriented, no adenopathy, well-dressed, normal affect, normal respiratory effort. Lower extremities pulses palpable distally bilateral venous stasis dermatitis compartments are soft and nontender no ulcers no cellulitis  Imaging: No results found. No images are attached to the encounter.  Labs: Lab Results  Component Value Date   HGBA1C 5.7 (H) 03/14/2017   HGBA1C 5.0 10/30/2014   ESRSEDRATE 28 (H) 10/30/2014   CRP 6.5 (H) 10/30/2014   REPTSTATUS 03/19/2017 FINAL 03/14/2017   CULT  03/14/2017    NO GROWTH 5 DAYS Performed at [facility], [place] [address], [place], [place] [address]      Lab Results  Component Value Date   ALBUMIN 1.9 (L) 03/20/2017   ALBUMIN 1.7 (L) 03/15/2017   ALBUMIN 2.3 (L) 03/14/2017   PREALBUMIN 9.4 (L) 10/30/2014    Lab Results  Component Value Date   MG 2.0 03/20/2017   MG 1.4 (L) 03/19/2017   No results found for: [facility]  Lab Results  Component Value Date   PREALBUMIN 9.4 (L) 10/30/2014   CBC  EXTENDED Latest Ref Rng & Units 03/19/2017 03/16/2017 03/15/2017  WBC 4.0 - 10.5 K/uL 10.3 11.5(H) 9.7  RBC 4.22 - 5.81 MIL/uL 4.62 4.52 4.30  HGB 13.0 - 17.0 g/dL 11.4(L) 11.3(L) 10.5(L)  HCT 39.0 - 52.0 % 35.0(L) 34.3(L) 32.2(L)  PLT 150 - 400 K/uL 291 267 231  NEUTROABS 1.7 - 7.7 K/uL - - -  LYMPHSABS 0.7 - 4.0 K/uL - - -     Body mass index is 29.69 kg/m.  Orders:  No orders of the defined types were placed in this encounter.  No orders of the defined types were placed in this encounter.    Procedures: No procedures performed  Clinical Data: No additional findings.  ROS:  All other systems negative, except as noted in the HPI. Review of Systems  Objective: Vital Signs: Ht 6\' 1"  (1.854 m)   Wt 225 lb (102.1 kg)   BMI 29.69 kg/m   Specialty Comments:  No specialty comments available.  PMFS History: Patient Active Problem List   Diagnosis Date Noted  . Pressure injury of skin 03/15/2017  . Cellulitis, leg 03/14/2017  . Cellulitis of both feet   . Cellulitis of right leg 10/30/2014  . Chronic venous insufficiency 04/28/2014  . PAD (peripheral artery disease) (  [place]) 01/21/2014  . Varicose veins of lower extremities with other complications 99991111  . Diabetes ([place]) 12/31/2013  . Hx of TIA (transient ischemic attack) and stroke 12/31/2013  . Other and unspecified hyperlipidemia 12/31/2013  . Severe obesity (BMI >= 40) ([place]) 12/31/2013  . Cellulitis of leg, left 09/13/2012  . 272.4 09/13/2012  . Essential hypertension, benign 09/13/2012  . Edema 09/13/2012   Past Medical History:  Diagnosis Date  . Anxiety   . Arthritis   . COPD (chronic obstructive pulmonary disease) ([place])   . Depression   . Diabetes mellitus without complication ([place])   . GERD (gastroesophageal reflux disease)   . Hyperlipidemia   . Hypertension   . Migraine   . Obesity   . Sleep apnea    CPAP   . Varicose veins     Family History  Problem Relation Age of Onset  .  Hypertension Mother   . Alzheimer's disease Mother   . Stroke Father        x 3  . Heart attack Father   . Congestive Heart Failure Father   . Diabetes Unknown        mat great aunts  . Colon cancer Neg Hx   . Rectal cancer Neg Hx   . Esophageal cancer Neg Hx     Past Surgical History:  Procedure Laterality Date  . ANKLE SURGERY Right    Dr. Marcelino Scot, has pins  . KNEE ARTHROSCOPY     Dr. Noemi Chapel  . right foot surgery,rt great toe callus removal    . TONSILLECTOMY AND ADENOIDECTOMY     age 60   Social History   Occupational History  . Occupation: retired Youth worker: RETIRED  Tobacco Use  . Smoking status: Current Some Day Smoker    Packs/day: 2.00    Years: 20.00    Pack years: 40.00    Types: Cigarettes  . Smokeless tobacco: Never Used  Substance and Sexual Activity  . Alcohol use: No    Alcohol/week: 0.0 standard drinks  . Drug use: No  . Sexual activity: Yes    Birth control/protection: Condom

## 2019-06-20 ENCOUNTER — Ambulatory Visit: Payer: Medicare Other | Admitting: Physician Assistant

## 2019-06-20 ENCOUNTER — Encounter: Payer: Self-pay | Admitting: Physician Assistant

## 2019-06-20 ENCOUNTER — Other Ambulatory Visit: Payer: Self-pay

## 2019-06-20 VITALS — Ht 73.0 in | Wt 225.0 lb

## 2019-06-20 DIAGNOSIS — I872 Venous insufficiency (chronic) (peripheral): Secondary | ICD-10-CM

## 2019-06-20 NOTE — Progress Notes (Signed)
Office Visit Note   Patient: Jordan [name]           Date of Birth: 06-Apr-1951           MRN: [id number] Visit Date: 06/20/2019              Requested by: [name], [place],  [place] [address] PCP: [name], MD  Chief Complaint  Patient presents with  . Right Leg - Follow-up  . Left Leg - Follow-up      HPI: This is a pleasant gentleman with a history of bilateral lower extremity venous stasis disease.  He had been having wraps but we have transitioned him to Vive compression stockings.  He has been very pleased with him.  He has obtained a second pair  Assessment & Plan: Visit Diagnoses: No diagnosis found.  Plan: He will follow up in 3 weeks.  We did recommend moisturizing his legs.  He will continue with the socks  Follow-Up Instructions: No follow-ups on file.   Ortho Exam  Patient is alert, oriented, no adenopathy, well-dressed, normal affect, normal respiratory effort. Bilateral lower extremities with flaking skin.  On the left leg there is an area where some of the skin has peeled off to healthy but bleeding skin beneath compartments are soft no ulcers  Imaging: No results found. No images are attached to the encounter.  Labs: Lab Results  Component Value Date   HGBA1C 5.7 (H) 03/14/2017   HGBA1C 5.0 10/30/2014   ESRSEDRATE 28 (H) 10/30/2014   CRP 6.5 (H) 10/30/2014   REPTSTATUS 03/19/2017 FINAL 03/14/2017   CULT  03/14/2017    NO GROWTH 5 DAYS Performed at [facility], [place] [address] [place], [place] [address]      Lab Results  Component Value Date   ALBUMIN 1.9 (L) 03/20/2017   ALBUMIN 1.7 (L) 03/15/2017   ALBUMIN 2.3 (L) 03/14/2017   PREALBUMIN 9.4 (L) 10/30/2014    Lab Results  Component Value Date   MG 2.0 03/20/2017   MG 1.4 (L) 03/19/2017   No results found for: [facility]  Lab Results  Component Value Date   PREALBUMIN 9.4 (L) 10/30/2014   CBC EXTENDED Latest Ref Rng & Units 03/19/2017  03/16/2017 03/15/2017  WBC 4.0 - 10.5 K/uL 10.3 11.5(H) 9.7  RBC 4.22 - 5.81 MIL/uL 4.62 4.52 4.30  HGB 13.0 - 17.0 g/dL 11.4(L) 11.3(L) 10.5(L)  HCT 39.0 - 52.0 % 35.0(L) 34.3(L) 32.2(L)  PLT 150 - 400 K/uL 291 267 231  NEUTROABS 1.7 - 7.7 K/uL - - -  LYMPHSABS 0.7 - 4.0 K/uL - - -     Body mass index is 29.69 kg/m.  Orders:  No orders of the defined types were placed in this encounter.  No orders of the defined types were placed in this encounter.    Procedures: No procedures performed  Clinical Data: No additional findings.  ROS:  All other systems negative, except as noted in the HPI. Review of Systems  Objective: Vital Signs: Ht 6\' 1"  (1.854 m)   Wt 225 lb (102.1 kg)   BMI 29.69 kg/m   Specialty Comments:  No specialty comments available.  PMFS History: Patient Active Problem List   Diagnosis Date Noted  . Pressure injury of skin 03/15/2017  . Cellulitis, leg 03/14/2017  . Cellulitis of both feet   . Cellulitis of right leg 10/30/2014  . Chronic venous insufficiency 04/28/2014  . PAD (peripheral artery disease) ([place]) 01/21/2014  .  Varicose veins of lower extremities with other complications 99991111  . Diabetes ([place]) 12/31/2013  . Hx of TIA (transient ischemic attack) and stroke 12/31/2013  . Other and unspecified hyperlipidemia 12/31/2013  . Severe obesity (BMI >= 40) ([place]) 12/31/2013  . Cellulitis of leg, left 09/13/2012  . 272.4 09/13/2012  . Essential hypertension, benign 09/13/2012  . Edema 09/13/2012   Past Medical History:  Diagnosis Date  . Anxiety   . Arthritis   . COPD (chronic obstructive pulmonary disease) ([place])   . Depression   . Diabetes mellitus without complication ([place])   . GERD (gastroesophageal reflux disease)   . Hyperlipidemia   . Hypertension   . Migraine   . Obesity   . Sleep apnea    CPAP   . Varicose veins     Family History  Problem Relation Age of Onset  . Hypertension Mother   . Alzheimer's disease Mother    . Stroke Father        x 3  . Heart attack Father   . Congestive Heart Failure Father   . Diabetes Unknown        mat great aunts  . Colon cancer Neg Hx   . Rectal cancer Neg Hx   . Esophageal cancer Neg Hx     Past Surgical History:  Procedure Laterality Date  . ANKLE SURGERY Right    Dr. Marcelino Scot, has pins  . KNEE ARTHROSCOPY     Dr. Noemi Chapel  . right foot surgery,rt great toe callus removal    . TONSILLECTOMY AND ADENOIDECTOMY     age 39   Social History   Occupational History  . Occupation: retired Youth worker: RETIRED  Tobacco Use  . Smoking status: Current Some Day Smoker    Packs/day: 2.00    Years: 20.00    Pack years: 40.00    Types: Cigarettes  . Smokeless tobacco: Never Used  Substance and Sexual Activity  . Alcohol use: No    Alcohol/week: 0.0 standard drinks  . Drug use: No  . Sexual activity: Yes    Birth control/protection: Condom

## 2019-07-04 ENCOUNTER — Other Ambulatory Visit: Payer: Self-pay

## 2019-07-04 ENCOUNTER — Ambulatory Visit: Payer: Medicare PPO | Admitting: Orthopedic Surgery

## 2019-07-04 ENCOUNTER — Encounter: Payer: Self-pay | Admitting: Physician Assistant

## 2019-07-04 VITALS — Ht 73.0 in | Wt 225.0 lb

## 2019-07-04 DIAGNOSIS — I872 Venous insufficiency (chronic) (peripheral): Secondary | ICD-10-CM | POA: Diagnosis not present

## 2019-07-04 NOTE — Progress Notes (Signed)
Office Visit Note   Patient: Jordan [name]           Date of Birth: [birth year]           MRN: [id number] Visit Date: 07/04/2019              Requested by: [name], [place]  [id number] PCP: [name], MD  Chief Complaint  Patient presents with  . Left Leg - Follow-up  . Right Leg - Follow-up      HPI: Patient is a 69 year old gentleman who presents in follow-up for chronic venous stasis insufficiency with ulceration dermatitis both legs.  Patient states the swelling has been decreasing he feels well has no concerns  Assessment & Plan: Visit Diagnoses:  1. Venous stasis dermatitis of both lower extremities     Plan: Patient will try to change from the double extra-large to the single extra-large compression socks.  He has decreased the swelling to a size smaller.  Follow-Up Instructions: Return in about 4 weeks (around 08/01/2019).   Ortho Exam  Patient is alert, oriented, no adenopathy, well-dressed, normal affect, normal respiratory effort. Examination patient has dermatitis and dry flaky skin but no open ulcers no drainage.  There is no cellulitis no signs of infection.  Patient's right calf measures 43 cm in circumference left calf is 44 cm in circumference this would correspond to a size extra-large sock he is currently wearing double extra-large socks  Imaging: No results found. No images are attached to the encounter.  Labs: Lab Results  Component Value Date   HGBA1C 5.7 (H) 03/14/2017   HGBA1C 5.0 10/30/2014   ESRSEDRATE 28 (H) 10/30/2014   CRP 6.5 (H) 10/30/2014   REPTSTATUS 03/19/2017 FINAL 03/14/2017   CULT  03/14/2017    NO GROWTH 5 DAYS Performed at [facility], [place] [address] [place], [place] [address]      Lab Results  Component Value Date   ALBUMIN 1.9 (L) 03/20/2017   ALBUMIN 1.7 (L) 03/15/2017   ALBUMIN 2.3 (L) 03/14/2017   PREALBUMIN 9.4 (L) 10/30/2014    Lab Results  Component Value Date    MG 2.0 03/20/2017   MG 1.4 (L) 03/19/2017   No results found for: [facility]  Lab Results  Component Value Date   PREALBUMIN 9.4 (L) 10/30/2014   CBC EXTENDED Latest Ref Rng & Units 03/19/2017 03/16/2017 03/15/2017  WBC 4.0 - 10.5 K/uL 10.3 11.5(H) 9.7  RBC 4.22 - 5.81 MIL/uL 4.62 4.52 4.30  HGB 13.0 - 17.0 g/dL 11.4(L) 11.3(L) 10.5(L)  HCT 39.0 - 52.0 % 35.0(L) 34.3(L) 32.2(L)  PLT 150 - 400 K/uL 291 267 231  NEUTROABS 1.7 - 7.7 K/uL - - -  LYMPHSABS 0.7 - 4.0 K/uL - - -     Body mass index is 29.69 kg/m.  Orders:  No orders of the defined types were placed in this encounter.  No orders of the defined types were placed in this encounter.    Procedures: No procedures performed  Clinical Data: No additional findings.  ROS:  All other systems negative, except as noted in the HPI. Review of Systems  Objective: Vital Signs: Ht 6\' 1"  (1.854 m)   Wt 225 lb (102.1 kg)   BMI 29.69 kg/m   Specialty Comments:  No specialty comments available.  PMFS History: Patient Active Problem List   Diagnosis Date Noted  . Pressure injury of skin 03/15/2017  . Cellulitis, leg 03/14/2017  . Cellulitis of  both feet   . Cellulitis of right leg 10/30/2014  . Chronic venous insufficiency 04/28/2014  . PAD (peripheral artery disease) ([place]) 01/21/2014  . Varicose veins of lower extremities with other complications 99991111  . Diabetes ([place]) 12/31/2013  . Hx of TIA (transient ischemic attack) and stroke 12/31/2013  . Other and unspecified hyperlipidemia 12/31/2013  . Severe obesity (BMI >= 40) ([place]) 12/31/2013  . Cellulitis of leg, left 09/13/2012  . 272.4 09/13/2012  . Essential hypertension, benign 09/13/2012  . Edema 09/13/2012   Past Medical History:  Diagnosis Date  . Anxiety   . Arthritis   . COPD (chronic obstructive pulmonary disease) ([place])   . Depression   . Diabetes mellitus without complication ([place])   . GERD (gastroesophageal reflux disease)   . Hyperlipidemia     . Hypertension   . Migraine   . Obesity   . Sleep apnea    CPAP   . Varicose veins     Family History  Problem Relation Age of Onset  . Hypertension Mother   . Alzheimer's disease Mother   . Stroke Father        x 3  . Heart attack Father   . Congestive Heart Failure Father   . Diabetes Unknown        mat great aunts  . Colon cancer Neg Hx   . Rectal cancer Neg Hx   . Esophageal cancer Neg Hx     Past Surgical History:  Procedure Laterality Date  . ANKLE SURGERY Right    Dr. Marcelino Scot, has pins  . KNEE ARTHROSCOPY     Dr. Noemi Chapel  . right foot surgery,rt great toe callus removal    . TONSILLECTOMY AND ADENOIDECTOMY     age 55   Social History   Occupational History  . Occupation: retired Youth worker: RETIRED  Tobacco Use  . Smoking status: Current Some Day Smoker    Packs/day: 2.00    Years: 20.00    Pack years: 40.00    Types: Cigarettes  . Smokeless tobacco: Never Used  Substance and Sexual Activity  . Alcohol use: No    Alcohol/week: 0.0 standard drinks  . Drug use: No  . Sexual activity: Yes    Birth control/protection: Condom

## 2019-07-10 ENCOUNTER — Ambulatory Visit: Payer: Medicare PPO | Attending: Internal Medicine

## 2019-07-10 DIAGNOSIS — Z23 Encounter for immunization: Secondary | ICD-10-CM

## 2019-07-10 NOTE — Progress Notes (Signed)
   Covid-19 Vaccination Clinic  Name:  Jordan [name]    MRN: [id number] DOB: [birth year]  07/10/2019  [name] was observed post Covid-19 immunization for 15 minutes without incidence. He was provided with Vaccine Information Sheet and instruction to access the V-Safe system.   [name] was instructed to call 911 with any severe reactions post vaccine: Marland Kitchen Difficulty breathing  . Swelling of your face and throat  . A fast heartbeat  . A bad rash all over your body  . Dizziness and weakness    Immunizations Administered    Name Date Dose VIS Date Route   Pfizer COVID-19 Vaccine 07/10/2019  2:23 PM 0.3 mL 05/31/2019 Intramuscular   Manufacturer: [place]   Lot: [id number]   [place]: [id number]

## 2019-07-31 ENCOUNTER — Ambulatory Visit: Payer: Medicare PPO | Attending: Internal Medicine

## 2019-07-31 DIAGNOSIS — Z23 Encounter for immunization: Secondary | ICD-10-CM

## 2019-07-31 NOTE — Progress Notes (Signed)
   Covid-19 Vaccination Clinic  Name:  Jordan [name]    MRN: [id number] DOB: [birth year]  07/31/2019  Jordan [name] was observed post Covid-19 immunization for 15 minutes without incidence. He was provided with Vaccine Information Sheet and instruction to access the V-Safe system.   Jordan [name] was instructed to call 911 with any severe reactions post vaccine: Marland Kitchen Difficulty breathing  . Swelling of your face and throat  . A fast heartbeat  . A bad rash all over your body  . Dizziness and weakness    Immunizations Administered    Name Date Dose VIS Date Route   Pfizer COVID-19 Vaccine 07/31/2019  2:05 PM 0.3 mL 05/31/2019 Intramuscular   Manufacturer: [place]   Lot: [id number]   [place]: [id number]

## 2019-08-01 ENCOUNTER — Other Ambulatory Visit: Payer: Self-pay

## 2019-08-01 ENCOUNTER — Encounter: Payer: Self-pay | Admitting: Orthopedic Surgery

## 2019-08-01 ENCOUNTER — Ambulatory Visit: Payer: Medicare PPO | Admitting: Orthopedic Surgery

## 2019-08-01 VITALS — Ht 73.0 in | Wt 225.0 lb

## 2019-08-01 DIAGNOSIS — I872 Venous insufficiency (chronic) (peripheral): Secondary | ICD-10-CM | POA: Diagnosis not present

## 2019-08-01 NOTE — Progress Notes (Signed)
Office Visit Note   Patient: Jordan [name]           Date of Birth: Sep 03, 1950           MRN: [id number] Visit Date: 08/01/2019              Requested by: [name], [place],  [place] [address] PCP: [name], MD  Chief Complaint  Patient presents with  . Left Leg - Follow-up  . Right Leg - Follow-up      HPI: This is a pleasant 69 year old gentleman who is here in follow up for his bilateral venous stasis dermatitis. He is wearing VIVE socks and is doing well  Assessment & Plan: Visit Diagnoses: No diagnosis found.  Plan:Follow up 1 month  Follow-Up Instructions: No follow-ups on file.   Ortho Exam  Patient is alert, oriented, no adenopathy, well-dressed, normal affect, normal respiratory effort. Bilateral lower extremities moderat swelling has decreased and dermatitis especially in the proximal lower leg.  Pulses are intact.  No open ulcers or sores.  No cellulitis  Imaging: No results found. No images are attached to the encounter.  Labs: Lab Results  Component Value Date   HGBA1C 5.7 (H) 03/14/2017   HGBA1C 5.0 10/30/2014   ESRSEDRATE 28 (H) 10/30/2014   CRP 6.5 (H) 10/30/2014   REPTSTATUS 03/19/2017 FINAL 03/14/2017   CULT  03/14/2017    NO GROWTH 5 DAYS Performed at [facility], [place] [address] [place], [place] [address]      Lab Results  Component Value Date   ALBUMIN 1.9 (L) 03/20/2017   ALBUMIN 1.7 (L) 03/15/2017   ALBUMIN 2.3 (L) 03/14/2017   PREALBUMIN 9.4 (L) 10/30/2014    Lab Results  Component Value Date   MG 2.0 03/20/2017   MG 1.4 (L) 03/19/2017   No results found for: [facility]  Lab Results  Component Value Date   PREALBUMIN 9.4 (L) 10/30/2014   CBC EXTENDED Latest Ref Rng & Units 03/19/2017 03/16/2017 03/15/2017  WBC 4.0 - 10.5 K/uL 10.3 11.5(H) 9.7  RBC 4.22 - 5.81 MIL/uL 4.62 4.52 4.30  HGB 13.0 - 17.0 g/dL 11.4(L) 11.3(L) 10.5(L)  HCT 39.0 - 52.0 % 35.0(L) 34.3(L) 32.2(L)  PLT 150 - 400  K/uL 291 267 231  NEUTROABS 1.7 - 7.7 K/uL - - -  LYMPHSABS 0.7 - 4.0 K/uL - - -     Body mass index is 29.69 kg/m.  Orders:  No orders of the defined types were placed in this encounter.  No orders of the defined types were placed in this encounter.    Procedures: No procedures performed  Clinical Data: No additional findings.  ROS:  All other systems negative, except as noted in the HPI. Review of Systems  Objective: Vital Signs: Ht 6\' 1"  (1.854 m)   Wt 225 lb (102.1 kg)   BMI 29.69 kg/m   Specialty Comments:  No specialty comments available.  PMFS History: Patient Active Problem List   Diagnosis Date Noted  . Pressure injury of skin 03/15/2017  . Cellulitis, leg 03/14/2017  . Cellulitis of both feet   . Cellulitis of right leg 10/30/2014  . Chronic venous insufficiency 04/28/2014  . PAD (peripheral artery disease) ([place]) 01/21/2014  . Varicose veins of lower extremities with other complications 99991111  . Diabetes ([place]) 12/31/2013  . Hx of TIA (transient ischemic attack) and stroke 12/31/2013  . Other and unspecified hyperlipidemia 12/31/2013  . Severe obesity (BMI >=  40) ([place]) 12/31/2013  . Cellulitis of leg, left 09/13/2012  . 272.4 09/13/2012  . Essential hypertension, benign 09/13/2012  . Edema 09/13/2012   Past Medical History:  Diagnosis Date  . Anxiety   . Arthritis   . COPD (chronic obstructive pulmonary disease) ([place])   . Depression   . Diabetes mellitus without complication ([place])   . GERD (gastroesophageal reflux disease)   . Hyperlipidemia   . Hypertension   . Migraine   . Obesity   . Sleep apnea    CPAP   . Varicose veins     Family History  Problem Relation Age of Onset  . Hypertension Mother   . Alzheimer's disease Mother   . Stroke Father        x 3  . Heart attack Father   . Congestive Heart Failure Father   . Diabetes Unknown        mat great aunts  . Colon cancer Neg Hx   . Rectal cancer Neg Hx   . Esophageal  cancer Neg Hx     Past Surgical History:  Procedure Laterality Date  . ANKLE SURGERY Right    Dr. Marcelino Scot, has pins  . KNEE ARTHROSCOPY     Dr. Noemi Chapel  . right foot surgery,rt great toe callus removal    . TONSILLECTOMY AND ADENOIDECTOMY     age 7   Social History   Occupational History  . Occupation: retired Youth worker: RETIRED  Tobacco Use  . Smoking status: Current Some Day Smoker    Packs/day: 2.00    Years: 20.00    Pack years: 40.00    Types: Cigarettes  . Smokeless tobacco: Never Used  Substance and Sexual Activity  . Alcohol use: No    Alcohol/week: 0.0 standard drinks  . Drug use: No  . Sexual activity: Yes    Birth control/protection: Condom

## 2019-08-29 ENCOUNTER — Ambulatory Visit: Payer: Medicare PPO | Admitting: Orthopedic Surgery

## 2019-09-05 ENCOUNTER — Encounter: Payer: Self-pay | Admitting: Orthopedic Surgery

## 2019-09-05 ENCOUNTER — Ambulatory Visit: Payer: Medicare PPO | Admitting: Physician Assistant

## 2019-09-05 ENCOUNTER — Other Ambulatory Visit: Payer: Self-pay

## 2019-09-05 VITALS — Ht 73.0 in | Wt 225.0 lb

## 2019-09-05 DIAGNOSIS — I872 Venous insufficiency (chronic) (peripheral): Secondary | ICD-10-CM

## 2019-09-05 NOTE — Progress Notes (Signed)
Office Visit Note   Patient: Jordan [name]           Date of Birth: 02-04-1951           MRN: [id number] Visit Date: 09/05/2019              Requested by: [name], [place],  [place] [address] PCP: [name], MD  Chief Complaint  Patient presents with  . Right Leg - Follow-up  . Left Leg - Follow-up      HPI: This is a pleasant 69 year old gentleman with a history of chronic lower extremity venous stasis disease.  He has been doing very well wearing his Vive compression socks. We also discussed using cocoa butter-based lotions to soften his skin  Assessment & Plan: Visit Diagnoses: No diagnosis found.  Plan: We placed him in Ace wrap today and he is going to go and buy another pair of compression socks.  Emphasized the importance of changing these out daily.  We also discussed using some cocoa butter to soften some of his skin  Follow-Up Instructions: No follow-ups on file.   Ortho Exam  Patient is alert, oriented, no adenopathy, well-dressed, normal affect, normal respiratory effort. Bilateral lower extremity swelling is well controlled.  He has a significant amount of dry flaking skin.  I do however think things are improved from prior visit.  No fluctuance no open ulcers  Imaging: No results found. No images are attached to the encounter.  Labs: Lab Results  Component Value Date   HGBA1C 5.7 (H) 03/14/2017   HGBA1C 5.0 10/30/2014   ESRSEDRATE 28 (H) 10/30/2014   CRP 6.5 (H) 10/30/2014   REPTSTATUS 03/19/2017 FINAL 03/14/2017   CULT  03/14/2017    NO GROWTH 5 DAYS Performed at [facility], [place] [address], [place], [place] [address]      Lab Results  Component Value Date   ALBUMIN 1.9 (L) 03/20/2017   ALBUMIN 1.7 (L) 03/15/2017   ALBUMIN 2.3 (L) 03/14/2017   PREALBUMIN 9.4 (L) 10/30/2014    Lab Results  Component Value Date   MG 2.0 03/20/2017   MG 1.4 (L) 03/19/2017   No results found for: [facility]  Lab  Results  Component Value Date   PREALBUMIN 9.4 (L) 10/30/2014   CBC EXTENDED Latest Ref Rng & Units 03/19/2017 03/16/2017 03/15/2017  WBC 4.0 - 10.5 K/uL 10.3 11.5(H) 9.7  RBC 4.22 - 5.81 MIL/uL 4.62 4.52 4.30  HGB 13.0 - 17.0 g/dL 11.4(L) 11.3(L) 10.5(L)  HCT 39.0 - 52.0 % 35.0(L) 34.3(L) 32.2(L)  PLT 150 - 400 K/uL 291 267 231  NEUTROABS 1.7 - 7.7 K/uL - - -  LYMPHSABS 0.7 - 4.0 K/uL - - -     Body mass index is 29.69 kg/m.  Orders:  No orders of the defined types were placed in this encounter.  No orders of the defined types were placed in this encounter.    Procedures: No procedures performed  Clinical Data: No additional findings.  ROS:  All other systems negative, except as noted in the HPI. Review of Systems  Objective: Vital Signs: Ht 6\' 1"  (1.854 m)   Wt 225 lb (102.1 kg)   BMI 29.69 kg/m   Specialty Comments:  No specialty comments available.  PMFS History: Patient Active Problem List   Diagnosis Date Noted  . Pressure injury of skin 03/15/2017  . Cellulitis, leg 03/14/2017  . Cellulitis of both feet   . Cellulitis of right  leg 10/30/2014  . Chronic venous insufficiency 04/28/2014  . PAD (peripheral artery disease) ([place]) 01/21/2014  . Varicose veins of lower extremities with other complications 99991111  . Diabetes ([place]) 12/31/2013  . Hx of TIA (transient ischemic attack) and stroke 12/31/2013  . Other and unspecified hyperlipidemia 12/31/2013  . Severe obesity (BMI >= 40) ([place]) 12/31/2013  . Cellulitis of leg, left 09/13/2012  . 272.4 09/13/2012  . Essential hypertension, benign 09/13/2012  . Edema 09/13/2012   Past Medical History:  Diagnosis Date  . Anxiety   . Arthritis   . COPD (chronic obstructive pulmonary disease) ([place])   . Depression   . Diabetes mellitus without complication ([place])   . GERD (gastroesophageal reflux disease)   . Hyperlipidemia   . Hypertension   . Migraine   . Obesity   . Sleep apnea    CPAP   . Varicose  veins     Family History  Problem Relation Age of Onset  . Hypertension Mother   . Alzheimer's disease Mother   . Stroke Father        x 3  . Heart attack Father   . Congestive Heart Failure Father   . Diabetes Unknown        mat great aunts  . Colon cancer Neg Hx   . Rectal cancer Neg Hx   . Esophageal cancer Neg Hx     Past Surgical History:  Procedure Laterality Date  . ANKLE SURGERY Right    Dr. Marcelino Scot, has pins  . KNEE ARTHROSCOPY     Dr. Noemi Chapel  . right foot surgery,rt great toe callus removal    . TONSILLECTOMY AND ADENOIDECTOMY     age 77   Social History   Occupational History  . Occupation: retired Youth worker: RETIRED  Tobacco Use  . Smoking status: Current Some Day Smoker    Packs/day: 2.00    Years: 20.00    Pack years: 40.00    Types: Cigarettes  . Smokeless tobacco: Never Used  Substance and Sexual Activity  . Alcohol use: No    Alcohol/week: 0.0 standard drinks  . Drug use: No  . Sexual activity: Yes    Birth control/protection: Condom

## 2019-10-03 ENCOUNTER — Other Ambulatory Visit: Payer: Self-pay

## 2019-10-03 ENCOUNTER — Encounter: Payer: Self-pay | Admitting: Orthopedic Surgery

## 2019-10-03 ENCOUNTER — Ambulatory Visit: Payer: Medicare PPO | Admitting: Physician Assistant

## 2019-10-03 DIAGNOSIS — I872 Venous insufficiency (chronic) (peripheral): Secondary | ICD-10-CM

## 2019-10-03 NOTE — Progress Notes (Signed)
Office Visit Note   Patient: Jordan [name]           Date of Birth: 08-21-1950           MRN: [id number] Visit Date: 10/03/2019              Requested by: [name], [place],  [place] [address] PCP: [name], MD  Chief Complaint  Patient presents with  . Right Leg - Follow-up  . Left Leg - Follow-up      HPI: Patient presents today in follow-up for his bilateral lower extremity peripheral vascular disease.  He has been using vive compression stockings.  He is also been putting cocoa butter on his legs.  He feels he continues to improve.  He is having a little difficulty as he is recovering from a wrist sprain and has had some difficulty putting the socks on.  Assessment & Plan: Visit Diagnoses: No diagnosis found.  Plan: He is going to look into a device to help him put his socks on today.  They are happy to do this for him anytime he needs it.  Follow-Up Instructions: No follow-ups on file.   Ortho Exam  Patient is alert, oriented, no adenopathy, well-dressed, normal affect, normal respiratory effort. Bilateral lower extremities significant decrease swelling skin is in better condition less flaking and dry skin is noted.  No open areas or ulcers.  Calfs are compressible.  Socks were applied  Imaging: No results found. No images are attached to the encounter.  Labs: Lab Results  Component Value Date   HGBA1C 5.7 (H) 03/14/2017   HGBA1C 5.0 10/30/2014   ESRSEDRATE 28 (H) 10/30/2014   CRP 6.5 (H) 10/30/2014   REPTSTATUS 03/19/2017 FINAL 03/14/2017   CULT  03/14/2017    NO GROWTH 5 DAYS Performed at [facility], [place] [address], [place], [place] [address]      Lab Results  Component Value Date   ALBUMIN 1.9 (L) 03/20/2017   ALBUMIN 1.7 (L) 03/15/2017   ALBUMIN 2.3 (L) 03/14/2017   PREALBUMIN 9.4 (L) 10/30/2014    Lab Results  Component Value Date   MG 2.0 03/20/2017   MG 1.4 (L) 03/19/2017   No results found for:  [facility]  Lab Results  Component Value Date   PREALBUMIN 9.4 (L) 10/30/2014   CBC EXTENDED Latest Ref Rng & Units 03/19/2017 03/16/2017 03/15/2017  WBC 4.0 - 10.5 K/uL 10.3 11.5(H) 9.7  RBC 4.22 - 5.81 MIL/uL 4.62 4.52 4.30  HGB 13.0 - 17.0 g/dL 11.4(L) 11.3(L) 10.5(L)  HCT 39.0 - 52.0 % 35.0(L) 34.3(L) 32.2(L)  PLT 150 - 400 K/uL 291 267 231  NEUTROABS 1.7 - 7.7 K/uL - - -  LYMPHSABS 0.7 - 4.0 K/uL - - -     There is no height or weight on file to calculate BMI.  Orders:  No orders of the defined types were placed in this encounter.  No orders of the defined types were placed in this encounter.    Procedures: No procedures performed  Clinical Data: No additional findings.  ROS:  All other systems negative, except as noted in the HPI. Review of Systems  Objective: Vital Signs: There were no vitals taken for this visit.  Specialty Comments:  No specialty comments available.  PMFS History: Patient Active Problem List   Diagnosis Date Noted  . Pressure injury of skin 03/15/2017  . Cellulitis, leg 03/14/2017  . Cellulitis of both feet   .  Cellulitis of right leg 10/30/2014  . Chronic venous insufficiency 04/28/2014  . PAD (peripheral artery disease) ([place]) 01/21/2014  . Varicose veins of lower extremities with other complications 99991111  . Diabetes ([place]) 12/31/2013  . Hx of TIA (transient ischemic attack) and stroke 12/31/2013  . Other and unspecified hyperlipidemia 12/31/2013  . Severe obesity (BMI >= 40) ([place]) 12/31/2013  . Cellulitis of leg, left 09/13/2012  . 272.4 09/13/2012  . Essential hypertension, benign 09/13/2012  . Edema 09/13/2012   Past Medical History:  Diagnosis Date  . Anxiety   . Arthritis   . COPD (chronic obstructive pulmonary disease) ([place])   . Depression   . Diabetes mellitus without complication ([place])   . GERD (gastroesophageal reflux disease)   . Hyperlipidemia   . Hypertension   . Migraine   . Obesity   . Sleep apnea    CPAP    . Varicose veins     Family History  Problem Relation Age of Onset  . Hypertension Mother   . Alzheimer's disease Mother   . Stroke Father        x 3  . Heart attack Father   . Congestive Heart Failure Father   . Diabetes Unknown        mat great aunts  . Colon cancer Neg Hx   . Rectal cancer Neg Hx   . Esophageal cancer Neg Hx     Past Surgical History:  Procedure Laterality Date  . ANKLE SURGERY Right    Dr. Marcelino Scot, has pins  . KNEE ARTHROSCOPY     Dr. Noemi Chapel  . right foot surgery,rt great toe callus removal    . TONSILLECTOMY AND ADENOIDECTOMY     age 75   Social History   Occupational History  . Occupation: retired Youth worker: RETIRED  Tobacco Use  . Smoking status: Current Some Day Smoker    Packs/day: 2.00    Years: 20.00    Pack years: 40.00    Types: Cigarettes  . Smokeless tobacco: Never Used  Substance and Sexual Activity  . Alcohol use: No    Alcohol/week: 0.0 standard drinks  . Drug use: No  . Sexual activity: Yes    Birth control/protection: Condom

## 2019-10-31 ENCOUNTER — Encounter: Payer: Self-pay | Admitting: Physician Assistant

## 2019-10-31 ENCOUNTER — Ambulatory Visit: Payer: Medicare PPO | Admitting: Physician Assistant

## 2019-10-31 ENCOUNTER — Other Ambulatory Visit: Payer: Self-pay

## 2019-10-31 DIAGNOSIS — I872 Venous insufficiency (chronic) (peripheral): Secondary | ICD-10-CM | POA: Diagnosis not present

## 2019-10-31 NOTE — Progress Notes (Signed)
Office Visit Note   Patient: Jordan [name]           Date of Birth: February 05, 1951           MRN: [id number] Visit Date: 10/31/2019              Requested by: [name], [place],  [place] [address] PCP: [name], MD  Chief Complaint  Patient presents with  . Left Leg - Pain, Follow-up  . Right Leg - Pain, Follow-up      HPI: This is a pleasant gentleman that we follow for bilateral lower extremity peripheral vascular disease.  He does wear his VIVE compression stockings.  He comes in today for a regular recheck and to have his nails trimmed  Assessment & Plan: Visit Diagnoses: No diagnosis found.  Plan: He will follow up in 1 month.  After obtaining verbal consent nails were trimmed x10  Follow-Up Instructions: No follow-ups on file.   Ortho Exam  Patient is alert, oriented, no adenopathy, well-dressed, normal affect, normal respiratory effort. Focused examination of his lower extremities demonstrates swelling to be under control mild erythema apartments are compressible there are no ulcers or skin breakdown he does have quite a bit of shedding of his skin.  No areas of concern at this time  Imaging: No results found. No images are attached to the encounter.  Labs: Lab Results  Component Value Date   HGBA1C 5.7 (H) 03/14/2017   HGBA1C 5.0 10/30/2014   ESRSEDRATE 28 (H) 10/30/2014   CRP 6.5 (H) 10/30/2014   REPTSTATUS 03/19/2017 FINAL 03/14/2017   CULT  03/14/2017    NO GROWTH 5 DAYS Performed at [facility], [place] [address], [place], [place] [address]      Lab Results  Component Value Date   ALBUMIN 1.9 (L) 03/20/2017   ALBUMIN 1.7 (L) 03/15/2017   ALBUMIN 2.3 (L) 03/14/2017   PREALBUMIN 9.4 (L) 10/30/2014    Lab Results  Component Value Date   MG 2.0 03/20/2017   MG 1.4 (L) 03/19/2017   No results found for: [facility]  Lab Results  Component Value Date   PREALBUMIN 9.4 (L) 10/30/2014   CBC EXTENDED Latest Ref Rng  & Units 03/19/2017 03/16/2017 03/15/2017  WBC 4.0 - 10.5 K/uL 10.3 11.5(H) 9.7  RBC 4.22 - 5.81 MIL/uL 4.62 4.52 4.30  HGB 13.0 - 17.0 g/dL 11.4(L) 11.3(L) 10.5(L)  HCT 39.0 - 52.0 % 35.0(L) 34.3(L) 32.2(L)  PLT 150 - 400 K/uL 291 267 231  NEUTROABS 1.7 - 7.7 K/uL - - -  LYMPHSABS 0.7 - 4.0 K/uL - - -     There is no height or weight on file to calculate BMI.  Orders:  No orders of the defined types were placed in this encounter.  No orders of the defined types were placed in this encounter.    Procedures: No procedures performed  Clinical Data: No additional findings.  ROS:  All other systems negative, except as noted in the HPI. Review of Systems  Objective: Vital Signs: There were no vitals taken for this visit.  Specialty Comments:  No specialty comments available.  PMFS History: Patient Active Problem List   Diagnosis Date Noted  . Pressure injury of skin 03/15/2017  . Cellulitis, leg 03/14/2017  . Cellulitis of both feet   . Cellulitis of right leg 10/30/2014  . Chronic venous insufficiency 04/28/2014  . PAD (peripheral artery disease) ([place]) 01/21/2014  . Varicose veins of lower  extremities with other complications 99991111  . Diabetes ([place]) 12/31/2013  . Hx of TIA (transient ischemic attack) and stroke 12/31/2013  . Other and unspecified hyperlipidemia 12/31/2013  . Severe obesity (BMI >= 40) ([place]) 12/31/2013  . Cellulitis of leg, left 09/13/2012  . 272.4 09/13/2012  . Essential hypertension, benign 09/13/2012  . Edema 09/13/2012   Past Medical History:  Diagnosis Date  . Anxiety   . Arthritis   . COPD (chronic obstructive pulmonary disease) ([place])   . Depression   . Diabetes mellitus without complication ([place])   . GERD (gastroesophageal reflux disease)   . Hyperlipidemia   . Hypertension   . Migraine   . Obesity   . Sleep apnea    CPAP   . Varicose veins     Family History  Problem Relation Age of Onset  . Hypertension Mother   .  Alzheimer's disease Mother   . Stroke Father        x 3  . Heart attack Father   . Congestive Heart Failure Father   . Diabetes Unknown        mat great aunts  . Colon cancer Neg Hx   . Rectal cancer Neg Hx   . Esophageal cancer Neg Hx     Past Surgical History:  Procedure Laterality Date  . ANKLE SURGERY Right    Dr. Marcelino Scot, has pins  . KNEE ARTHROSCOPY     Dr. Noemi Chapel  . right foot surgery,rt great toe callus removal    . TONSILLECTOMY AND ADENOIDECTOMY     age 87   Social History   Occupational History  . Occupation: retired Youth worker: RETIRED  Tobacco Use  . Smoking status: Current Some Day Smoker    Packs/day: 2.00    Years: 20.00    Pack years: 40.00    Types: Cigarettes  . Smokeless tobacco: Never Used  Substance and Sexual Activity  . Alcohol use: No    Alcohol/week: 0.0 standard drinks  . Drug use: No  . Sexual activity: Yes    Birth control/protection: Condom

## 2019-11-28 ENCOUNTER — Encounter: Payer: Self-pay | Admitting: Physician Assistant

## 2019-11-28 ENCOUNTER — Other Ambulatory Visit: Payer: Self-pay

## 2019-11-28 ENCOUNTER — Ambulatory Visit: Payer: Medicare PPO | Admitting: Physician Assistant

## 2019-11-28 VITALS — Ht 73.0 in | Wt 225.0 lb

## 2019-11-28 DIAGNOSIS — I739 Peripheral vascular disease, unspecified: Secondary | ICD-10-CM

## 2019-11-28 NOTE — Progress Notes (Signed)
Office Visit Note   Patient: Jordan [name]           Date of Birth: [birth year]           MRN: [id number] Visit Date: 11/28/2019              Requested by: [name], [place],  [place] [address] PCP: [name], MD  Chief Complaint  Patient presents with  . Right Leg - Follow-up  . Left Leg - Follow-up  . Left Foot - Follow-up    Nail Trim  . Right Foot - Follow-up      HPI: This is a pleasant gentleman with a history of venous stasis disease.  He has been using his vive compression socks and does quite well with them.  He is here for nail trim.  Of note he has lost over 100 pounds and now has an A1c of 5.6 this was purposeful weight loss by decreasing his calorie intake  Assessment & Plan: Visit Diagnoses: No diagnosis found.  Plan: He will follow up as needed there was one very small blister on his toe that I told him to keep an eye on.  Follow-Up Instructions: No follow-ups on file.   Ortho Exam  Patient is alert, oriented, no adenopathy, well-dressed, normal affect, normal respiratory effort. Bilateral venous stasis with scaling of his skin however this is improved from previous visits.  There is no open wounds or ulcers.  Also onychomycotic nails which were trimmed.  After obtaining verbal permission.  He does have a small blister on the left third toe.  There is no surrounding erythema or no swelling  Imaging: No results found. No images are attached to the encounter.  Labs: Lab Results  Component Value Date   HGBA1C 5.7 (H) 03/14/2017   HGBA1C 5.0 10/30/2014   ESRSEDRATE 28 (H) 10/30/2014   CRP 6.5 (H) 10/30/2014   REPTSTATUS 03/19/2017 FINAL 03/14/2017   CULT  03/14/2017    NO GROWTH 5 DAYS Performed at [facility], [place] [address], [place], [place] [address]      Lab Results  Component Value Date   ALBUMIN 1.9 (L) 03/20/2017   ALBUMIN 1.7 (L) 03/15/2017   ALBUMIN 2.3 (L) 03/14/2017   PREALBUMIN 9.4 (L)  10/30/2014    Lab Results  Component Value Date   MG 2.0 03/20/2017   MG 1.4 (L) 03/19/2017   No results found for: [facility]  Lab Results  Component Value Date   PREALBUMIN 9.4 (L) 10/30/2014   CBC EXTENDED Latest Ref Rng & Units 03/19/2017 03/16/2017 03/15/2017  WBC 4.0 - 10.5 K/uL 10.3 11.5(H) 9.7  RBC 4.22 - 5.81 MIL/uL 4.62 4.52 4.30  HGB 13.0 - 17.0 g/dL 11.4(L) 11.3(L) 10.5(L)  HCT 39 - 52 % 35.0(L) 34.3(L) 32.2(L)  PLT 150 - 400 K/uL 291 267 231  NEUTROABS 1.7 - 7.7 K/uL - - -  LYMPHSABS 0.7 - 4.0 K/uL - - -     Body mass index is 29.69 kg/m.  Orders:  No orders of the defined types were placed in this encounter.  No orders of the defined types were placed in this encounter.    Procedures: No procedures performed  Clinical Data: No additional findings.  ROS:  All other systems negative, except as noted in the HPI. Review of Systems  Objective: Vital Signs: Ht 6\' 1"  (1.854 m)   Wt 225 lb (102.1 kg)   BMI 29.69 kg/m   Specialty Comments:  No specialty comments available.  PMFS History: Patient Active Problem List   Diagnosis Date Noted  . Pressure injury of skin 03/15/2017  . Cellulitis, leg 03/14/2017  . Cellulitis of both feet   . Cellulitis of right leg 10/30/2014  . Chronic venous insufficiency 04/28/2014  . PAD (peripheral artery disease) ([place]) 01/21/2014  . Varicose veins of lower extremities with other complications 50/35/4656  . Diabetes ([place]) 12/31/2013  . Hx of TIA (transient ischemic attack) and stroke 12/31/2013  . Other and unspecified hyperlipidemia 12/31/2013  . Severe obesity (BMI >= 40) ([place]) 12/31/2013  . Cellulitis of leg, left 09/13/2012  . 272.4 09/13/2012  . Essential hypertension, benign 09/13/2012  . Edema 09/13/2012   Past Medical History:  Diagnosis Date  . Anxiety   . Arthritis   . COPD (chronic obstructive pulmonary disease) ([place])   . Depression   . Diabetes mellitus without complication ([place])   . GERD  (gastroesophageal reflux disease)   . Hyperlipidemia   . Hypertension   . Migraine   . Obesity   . Sleep apnea    CPAP   . Varicose veins     Family History  Problem Relation Age of Onset  . Hypertension Mother   . Alzheimer's disease Mother   . Stroke Father        x 3  . Heart attack Father   . Congestive Heart Failure Father   . Diabetes Unknown        mat great aunts  . Colon cancer Neg Hx   . Rectal cancer Neg Hx   . Esophageal cancer Neg Hx     Past Surgical History:  Procedure Laterality Date  . ANKLE SURGERY Right    Dr. Marcelino Scot, has pins  . KNEE ARTHROSCOPY     Dr. Noemi Chapel  . right foot surgery,rt great toe callus removal    . TONSILLECTOMY AND ADENOIDECTOMY     age 37   Social History   Occupational History  . Occupation: retired Youth worker: RETIRED  Tobacco Use  . Smoking status: Current Some Day Smoker    Packs/day: 2.00    Years: 20.00    Pack years: 40.00    Types: Cigarettes  . Smokeless tobacco: Never Used  Vaping Use  . Vaping Use: Never used  Substance and Sexual Activity  . Alcohol use: No    Alcohol/week: 0.0 standard drinks  . Drug use: No  . Sexual activity: Yes    Birth control/protection: Condom

## 2019-12-26 ENCOUNTER — Other Ambulatory Visit: Payer: Self-pay

## 2019-12-26 ENCOUNTER — Encounter: Payer: Self-pay | Admitting: Physician Assistant

## 2019-12-26 ENCOUNTER — Ambulatory Visit: Payer: Medicare PPO | Admitting: Physician Assistant

## 2019-12-26 VITALS — Ht 73.0 in | Wt 225.0 lb

## 2019-12-26 DIAGNOSIS — I872 Venous insufficiency (chronic) (peripheral): Secondary | ICD-10-CM

## 2019-12-26 NOTE — Progress Notes (Signed)
Office Visit Note   Patient: Jordan [name]           Date of Birth: 10-24-1950           MRN: [id number] Visit Date: 12/26/2019              Requested by: [name], [place],  [place] [address] PCP: [name], MD  Chief Complaint  Patient presents with  . Right Leg - Follow-up  . Left Leg - Follow-up      HPI: This is a pleasant 69 year old gentleman who we follow for bilateral lower extremity venous stasis disease.  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  He comes in monthly for a check of his lower legs he has lost 135 pounds in the last couple years and is now been taken off of his diabetic medications reporting an A1c of 5.2  Assessment & Plan: Visit Diagnoses: No diagnosis found.  Plan: Continue with his vive compression stockings follow-up in 1 month sooner if any changes  Follow-Up Instructions: No follow-ups on file.   Ortho Exam  Patient is alert, oriented, no adenopathy, well-dressed, normal affect, normal respiratory effort. Focused examination demonstrates onychomycotic nails which were trimmed.  No open ulcers  he has chronic venous stasis dermatitis but has improved in the last month.  No cellulitis.  Compartments are soft and compressible.  Pulses are intact and palpable.  Imaging: No results found. No images are attached to the encounter.  Labs: Lab Results  Component Value Date   HGBA1C 5.7 (H) 03/14/2017   HGBA1C 5.0 10/30/2014   ESRSEDRATE 28 (H) 10/30/2014   CRP 6.5 (H) 10/30/2014   REPTSTATUS 03/19/2017 FINAL 03/14/2017   CULT  03/14/2017    NO GROWTH 5 DAYS Performed at [facility], [place] [address], [place], [place] [address]      Lab Results  Component Value Date   ALBUMIN 1.9 (L) 03/20/2017   ALBUMIN 1.7 (L) 03/15/2017   ALBUMIN 2.3 (L) 03/14/2017   PREALBUMIN 9.4 (L) 10/30/2014    Lab Results  Component Value Date    MG 2.0 03/20/2017   MG 1.4 (L) 03/19/2017   No results found for: [facility]  Lab Results  Component Value Date   PREALBUMIN 9.4 (L) 10/30/2014   CBC EXTENDED Latest Ref Rng & Units 03/19/2017 03/16/2017 03/15/2017  WBC 4.0 - 10.5 K/uL 10.3 11.5(H) 9.7  RBC 4.22 - 5.81 MIL/uL 4.62 4.52 4.30  HGB 13.0 - 17.0 g/dL 11.4(L) 11.3(L) 10.5(L)  HCT 39 - 52 % 35.0(L) 34.3(L) 32.2(L)  PLT 150 - 400 K/uL 291 267 231  NEUTROABS 1.7 - 7.7 K/uL - - -  LYMPHSABS 0.7 - 4.0 K/uL - - -     Body mass index is 29.69 kg/m.  Orders:  No orders of the defined types were placed in this encounter.  No orders of the defined types were placed in this encounter.    Procedures: No procedures performed  Clinical Data: No additional findings.  ROS:  All other systems negative, except as noted in the HPI. Review of Systems  Objective: Vital Signs: Ht 6\' 1"  (1.854 m)   Wt 225 lb (102.1 kg)   BMI 29.69 kg/m   Specialty Comments:  No specialty comments available.  PMFS History: Patient Active Problem List   Diagnosis Date Noted  . Pressure injury of skin 03/15/2017  . Cellulitis, leg  03/14/2017  . Cellulitis of both feet   . Cellulitis of right leg 10/30/2014  . Chronic venous insufficiency 04/28/2014  . PAD (peripheral artery disease) ([place]) 01/21/2014  . Varicose veins of lower extremities with other complications 97/94/8016  . Diabetes ([place]) 12/31/2013  . Hx of TIA (transient ischemic attack) and stroke 12/31/2013  . Other and unspecified hyperlipidemia 12/31/2013  . Severe obesity (BMI >= 40) ([place]) 12/31/2013  . Cellulitis of leg, left 09/13/2012  . 272.4 09/13/2012  . Essential hypertension, benign 09/13/2012  . Edema 09/13/2012   Past Medical History:  Diagnosis Date  . Anxiety   . Arthritis   . COPD (chronic obstructive pulmonary disease) ([place])   . Depression   . Diabetes mellitus without complication ([place])   . GERD (gastroesophageal reflux disease)   . Hyperlipidemia   .  Hypertension   . Migraine   . Obesity   . Sleep apnea    CPAP   . Varicose veins     Family History  Problem Relation Age of Onset  . Hypertension Mother   . Alzheimer's disease Mother   . Stroke Father        x 3  . Heart attack Father   . Congestive Heart Failure Father   . Diabetes Unknown        mat great aunts  . Colon cancer Neg Hx   . Rectal cancer Neg Hx   . Esophageal cancer Neg Hx     Past Surgical History:  Procedure Laterality Date  . ANKLE SURGERY Right    Dr. Marcelino Scot, has pins  . KNEE ARTHROSCOPY     Dr. Noemi Chapel  . right foot surgery,rt great toe callus removal    . TONSILLECTOMY AND ADENOIDECTOMY     age 40   Social History   Occupational History  . Occupation: retired Youth worker: RETIRED  Tobacco Use  . Smoking status: Current Some Day Smoker    Packs/day: 2.00    Years: 20.00    Pack years: 40.00    Types: Cigarettes  . Smokeless tobacco: Never Used  Vaping Use  . Vaping Use: Never used  Substance and Sexual Activity  . Alcohol use: No    Alcohol/week: 0.0 standard drinks  . Drug use: No  . Sexual activity: Yes    Birth control/protection: Condom

## 2020-01-23 ENCOUNTER — Ambulatory Visit: Payer: Medicare PPO | Admitting: Physician Assistant

## 2020-01-30 ENCOUNTER — Encounter: Payer: Self-pay | Admitting: Physician Assistant

## 2020-01-30 ENCOUNTER — Ambulatory Visit: Payer: Medicare PPO | Admitting: Physician Assistant

## 2020-01-30 DIAGNOSIS — I872 Venous insufficiency (chronic) (peripheral): Secondary | ICD-10-CM | POA: Diagnosis not present

## 2020-01-30 NOTE — Progress Notes (Signed)
Office Visit Note   Patient: Jordan [name]           Date of Birth: 1950-07-29           MRN: [id number] Visit Date: 01/30/2020              Requested by: [name], [place],  [place] [address] PCP: [name], MD  Chief Complaint  Patient presents with  . Right Leg - Follow-up  . Left Leg - Follow-up      HPI: This is a pleasant gentleman who follows up for his bilateral venous insufficiency.  He comes in monthly.  He also has chronic osteomyelitis of his great toe.  This is not changed.  He is wearing his vive compression socks.  He has intentionally lost 135 pounds in the last 3 years and has stabilized his blood sugars  Assessment & Plan: Visit Diagnoses: No diagnosis found.  Plan: Patient will continue to follow-up in a month.  If he notices any changes in his legs or his toe he may follow-up sooner  Follow-Up Instructions: No follow-ups on file.   Ortho Exam  Patient is alert, oriented, no adenopathy, well-dressed, normal affect, normal respiratory effort. Bilateral lower extremity venous insufficiency.  Skin is overall in good condition and compressible.  He does have quite a bit of flaking of his dry skin.  No open ulcers or sores.  No evidence of necrosis.  Imaging: No results found. No images are attached to the encounter.  Labs: Lab Results  Component Value Date   HGBA1C 5.7 (H) 03/14/2017   HGBA1C 5.0 10/30/2014   ESRSEDRATE 28 (H) 10/30/2014   CRP 6.5 (H) 10/30/2014   REPTSTATUS 03/19/2017 FINAL 03/14/2017   CULT  03/14/2017    NO GROWTH 5 DAYS Performed at [facility], [place] [name], [place], [place] [address]      Lab Results  Component Value Date   ALBUMIN 1.9 (L) 03/20/2017   ALBUMIN 1.7 (L) 03/15/2017   ALBUMIN 2.3 (L) 03/14/2017   PREALBUMIN 9.4 (L) 10/30/2014    Lab Results  Component Value Date   MG 2.0 03/20/2017   MG 1.4 (L) 03/19/2017   No results found for: [facility]  Lab Results   Component Value Date   PREALBUMIN 9.4 (L) 10/30/2014   CBC EXTENDED Latest Ref Rng & Units 03/19/2017 03/16/2017 03/15/2017  WBC 4.0 - 10.5 K/uL 10.3 11.5(H) 9.7  RBC 4.22 - 5.81 MIL/uL 4.62 4.52 4.30  HGB 13.0 - 17.0 g/dL 11.4(L) 11.3(L) 10.5(L)  HCT 39 - 52 % 35.0(L) 34.3(L) 32.2(L)  PLT 150 - 400 K/uL 291 267 231  NEUTROABS 1.7 - 7.7 K/uL - - -  LYMPHSABS 0.7 - 4.0 K/uL - - -     There is no height or weight on file to calculate BMI.  Orders:  No orders of the defined types were placed in this encounter.  No orders of the defined types were placed in this encounter.    Procedures: No procedures performed  Clinical Data: No additional findings.  ROS:  All other systems negative, except as noted in the HPI. Review of Systems  Objective: Vital Signs: There were no vitals taken for this visit.  Specialty Comments:  No specialty comments available.  PMFS History: Patient Active Problem List   Diagnosis Date Noted  . Pressure injury of skin 03/15/2017  . Cellulitis, leg 03/14/2017  . Cellulitis of both feet   . Cellulitis of right  leg 10/30/2014  . Chronic venous insufficiency 04/28/2014  . PAD (peripheral artery disease) ([place]) 01/21/2014  . Varicose veins of lower extremities with other complications 03/49/1791  . Diabetes ([place]) 12/31/2013  . Hx of TIA (transient ischemic attack) and stroke 12/31/2013  . Other and unspecified hyperlipidemia 12/31/2013  . Severe obesity (BMI >= 40) ([place]) 12/31/2013  . Cellulitis of leg, left 09/13/2012  . 272.4 09/13/2012  . Essential hypertension, benign 09/13/2012  . Edema 09/13/2012   Past Medical History:  Diagnosis Date  . Anxiety   . Arthritis   . COPD (chronic obstructive pulmonary disease) ([place])   . Depression   . Diabetes mellitus without complication ([place])   . GERD (gastroesophageal reflux disease)   . Hyperlipidemia   . Hypertension   . Migraine   . Obesity   . Sleep apnea    CPAP   . Varicose veins      Family History  Problem Relation Age of Onset  . Hypertension Mother   . Alzheimer's disease Mother   . Stroke Father        x 3  . Heart attack Father   . Congestive Heart Failure Father   . Diabetes Unknown        mat great aunts  . Colon cancer Neg Hx   . Rectal cancer Neg Hx   . Esophageal cancer Neg Hx     Past Surgical History:  Procedure Laterality Date  . ANKLE SURGERY Right    Dr. Marcelino Scot, has pins  . KNEE ARTHROSCOPY     Dr. Noemi Chapel  . right foot surgery,rt great toe callus removal    . TONSILLECTOMY AND ADENOIDECTOMY     age 32   Social History   Occupational History  . Occupation: retired Youth worker: RETIRED  Tobacco Use  . Smoking status: Current Some Day Smoker    Packs/day: 2.00    Years: 20.00    Pack years: 40.00    Types: Cigarettes  . Smokeless tobacco: Never Used  Vaping Use  . Vaping Use: Never used  Substance and Sexual Activity  . Alcohol use: No    Alcohol/week: 0.0 standard drinks  . Drug use: No  . Sexual activity: Yes    Birth control/protection: Condom

## 2020-03-03 ENCOUNTER — Emergency Department (HOSPITAL_COMMUNITY): Payer: Medicare PPO

## 2020-03-03 ENCOUNTER — Encounter (HOSPITAL_COMMUNITY): Payer: Self-pay

## 2020-03-03 ENCOUNTER — Inpatient Hospital Stay (HOSPITAL_COMMUNITY)
Admission: EM | Admit: 2020-03-03 | Discharge: 2020-03-12 | DRG: 286 | Disposition: A | Discharge status: Home Health | Payer: Medicare PPO | Attending: Internal Medicine | Admitting: Internal Medicine

## 2020-03-03 DIAGNOSIS — F329 Major depressive disorder, single episode, unspecified: Secondary | ICD-10-CM | POA: Diagnosis present

## 2020-03-03 DIAGNOSIS — L89322 Pressure ulcer of left buttock, stage 2: Secondary | ICD-10-CM | POA: Diagnosis present

## 2020-03-03 DIAGNOSIS — J9601 Acute respiratory failure with hypoxia: Secondary | ICD-10-CM | POA: Diagnosis present

## 2020-03-03 DIAGNOSIS — D649 Anemia, unspecified: Secondary | ICD-10-CM | POA: Diagnosis present

## 2020-03-03 DIAGNOSIS — E11649 Type 2 diabetes mellitus with hypoglycemia without coma: Secondary | ICD-10-CM | POA: Diagnosis present

## 2020-03-03 DIAGNOSIS — I351 Nonrheumatic aortic (valve) insufficiency: Secondary | ICD-10-CM | POA: Diagnosis not present

## 2020-03-03 DIAGNOSIS — Z20822 Contact with and (suspected) exposure to covid-19: Secondary | ICD-10-CM | POA: Diagnosis present

## 2020-03-03 DIAGNOSIS — E43 Unspecified severe protein-calorie malnutrition: Secondary | ICD-10-CM | POA: Diagnosis present

## 2020-03-03 DIAGNOSIS — K089 Disorder of teeth and supporting structures, unspecified: Secondary | ICD-10-CM | POA: Diagnosis not present

## 2020-03-03 DIAGNOSIS — N1831 Chronic kidney disease, stage 3a: Secondary | ICD-10-CM | POA: Diagnosis present

## 2020-03-03 DIAGNOSIS — F419 Anxiety disorder, unspecified: Secondary | ICD-10-CM | POA: Diagnosis present

## 2020-03-03 DIAGNOSIS — R0602 Shortness of breath: Secondary | ICD-10-CM

## 2020-03-03 DIAGNOSIS — G4733 Obstructive sleep apnea (adult) (pediatric): Secondary | ICD-10-CM | POA: Diagnosis present

## 2020-03-03 DIAGNOSIS — Z23 Encounter for immunization: Secondary | ICD-10-CM

## 2020-03-03 DIAGNOSIS — Z7982 Long term (current) use of aspirin: Secondary | ICD-10-CM

## 2020-03-03 DIAGNOSIS — I350 Nonrheumatic aortic (valve) stenosis: Secondary | ICD-10-CM | POA: Diagnosis not present

## 2020-03-03 DIAGNOSIS — E875 Hyperkalemia: Secondary | ICD-10-CM

## 2020-03-03 DIAGNOSIS — K219 Gastro-esophageal reflux disease without esophagitis: Secondary | ICD-10-CM | POA: Diagnosis present

## 2020-03-03 DIAGNOSIS — M1990 Unspecified osteoarthritis, unspecified site: Secondary | ICD-10-CM | POA: Diagnosis present

## 2020-03-03 DIAGNOSIS — E785 Hyperlipidemia, unspecified: Secondary | ICD-10-CM | POA: Diagnosis present

## 2020-03-03 DIAGNOSIS — I10 Essential (primary) hypertension: Secondary | ICD-10-CM | POA: Diagnosis not present

## 2020-03-03 DIAGNOSIS — N179 Acute kidney failure, unspecified: Secondary | ICD-10-CM | POA: Diagnosis not present

## 2020-03-03 DIAGNOSIS — R600 Localized edema: Secondary | ICD-10-CM | POA: Diagnosis not present

## 2020-03-03 DIAGNOSIS — I509 Heart failure, unspecified: Secondary | ICD-10-CM

## 2020-03-03 DIAGNOSIS — I5033 Acute on chronic diastolic (congestive) heart failure: Secondary | ICD-10-CM | POA: Diagnosis present

## 2020-03-03 DIAGNOSIS — G43909 Migraine, unspecified, not intractable, without status migrainosus: Secondary | ICD-10-CM | POA: Diagnosis present

## 2020-03-03 DIAGNOSIS — E162 Hypoglycemia, unspecified: Secondary | ICD-10-CM | POA: Diagnosis not present

## 2020-03-03 DIAGNOSIS — J449 Chronic obstructive pulmonary disease, unspecified: Secondary | ICD-10-CM | POA: Diagnosis present

## 2020-03-03 DIAGNOSIS — I352 Nonrheumatic aortic (valve) stenosis with insufficiency: Secondary | ICD-10-CM | POA: Diagnosis present

## 2020-03-03 DIAGNOSIS — Z833 Family history of diabetes mellitus: Secondary | ICD-10-CM

## 2020-03-03 DIAGNOSIS — I130 Hypertensive heart and chronic kidney disease with heart failure and stage 1 through stage 4 chronic kidney disease, or unspecified chronic kidney disease: Principal | ICD-10-CM | POA: Diagnosis present

## 2020-03-03 DIAGNOSIS — N400 Enlarged prostate without lower urinary tract symptoms: Secondary | ICD-10-CM | POA: Diagnosis present

## 2020-03-03 DIAGNOSIS — I872 Venous insufficiency (chronic) (peripheral): Secondary | ICD-10-CM | POA: Diagnosis present

## 2020-03-03 DIAGNOSIS — Z8249 Family history of ischemic heart disease and other diseases of the circulatory system: Secondary | ICD-10-CM

## 2020-03-03 DIAGNOSIS — Z885 Allergy status to narcotic agent status: Secondary | ICD-10-CM

## 2020-03-03 DIAGNOSIS — Z6824 Body mass index (BMI) 24.0-24.9, adult: Secondary | ICD-10-CM

## 2020-03-03 DIAGNOSIS — Z888 Allergy status to other drugs, medicaments and biological substances status: Secondary | ICD-10-CM

## 2020-03-03 DIAGNOSIS — Z720 Tobacco use: Secondary | ICD-10-CM | POA: Diagnosis present

## 2020-03-03 DIAGNOSIS — I2510 Atherosclerotic heart disease of native coronary artery without angina pectoris: Secondary | ICD-10-CM | POA: Diagnosis present

## 2020-03-03 DIAGNOSIS — E119 Type 2 diabetes mellitus without complications: Secondary | ICD-10-CM

## 2020-03-03 DIAGNOSIS — Z79899 Other long term (current) drug therapy: Secondary | ICD-10-CM

## 2020-03-03 DIAGNOSIS — I5023 Acute on chronic systolic (congestive) heart failure: Secondary | ICD-10-CM | POA: Diagnosis not present

## 2020-03-03 DIAGNOSIS — F17210 Nicotine dependence, cigarettes, uncomplicated: Secondary | ICD-10-CM | POA: Diagnosis present

## 2020-03-03 DIAGNOSIS — I471 Supraventricular tachycardia: Secondary | ICD-10-CM | POA: Diagnosis not present

## 2020-03-03 DIAGNOSIS — Z954 Presence of other heart-valve replacement: Secondary | ICD-10-CM | POA: Diagnosis not present

## 2020-03-03 DIAGNOSIS — I5031 Acute diastolic (congestive) heart failure: Secondary | ICD-10-CM | POA: Diagnosis not present

## 2020-03-03 HISTORY — DX: Chronic diastolic (congestive) heart failure: I50.32

## 2020-03-03 LAB — CBC WITH DIFFERENTIAL/PLATELET
Abs Immature Granulocytes: 0.03 10*3/uL (ref 0.00–0.07)
Basophils Absolute: 0 10*3/uL (ref 0.0–0.1)
Basophils Relative: 0 %
Eosinophils Absolute: 0 10*3/uL (ref 0.0–0.5)
Eosinophils Relative: 0 %
HCT: 44.2 % (ref 39.0–52.0)
Hemoglobin: 13.4 g/dL (ref 13.0–17.0)
Immature Granulocytes: 0 %
Lymphocytes Relative: 18 %
Lymphs Abs: 1.4 10*3/uL (ref 0.7–4.0)
MCH: 29.1 pg (ref 26.0–34.0)
MCHC: 30.3 g/dL (ref 30.0–36.0)
MCV: 95.9 fL (ref 80.0–100.0)
Monocytes Absolute: 0.8 10*3/uL (ref 0.1–1.0)
Monocytes Relative: 10 %
Neutro Abs: 5.5 10*3/uL (ref 1.7–7.7)
Neutrophils Relative %: 72 %
Platelets: 169 10*3/uL (ref 150–400)
RBC: 4.61 MIL/uL (ref 4.22–5.81)
RDW: 14.6 % (ref 11.5–15.5)
WBC: 7.6 10*3/uL (ref 4.0–10.5)
nRBC: 0 % (ref 0.0–0.2)

## 2020-03-03 LAB — SARS CORONAVIRUS 2 BY RT PCR (HOSPITAL ORDER, PERFORMED IN ~~LOC~~ HOSPITAL LAB): SARS Coronavirus 2: NEGATIVE

## 2020-03-03 LAB — HIV ANTIBODY (ROUTINE TESTING W REFLEX): HIV Screen 4th Generation wRfx: NONREACTIVE

## 2020-03-03 LAB — COMPREHENSIVE METABOLIC PANEL
ALT: 13 U/L (ref 0–44)
AST: 25 U/L (ref 15–41)
Albumin: 3.1 g/dL — ABNORMAL LOW (ref 3.5–5.0)
Alkaline Phosphatase: 151 U/L — ABNORMAL HIGH (ref 38–126)
Anion gap: 8 (ref 5–15)
BUN: 25 mg/dL — ABNORMAL HIGH (ref 8–23)
CO2: 28 mmol/L (ref 22–32)
Calcium: 8.5 mg/dL — ABNORMAL LOW (ref 8.9–10.3)
Chloride: 97 mmol/L — ABNORMAL LOW (ref 98–111)
Creatinine, Ser: 1.1 mg/dL (ref 0.61–1.24)
GFR calc Af Amer: >60 mL/min (ref >60)
GFR calc non Af Amer: >60 mL/min (ref >60)
Glucose, Bld: 87 mg/dL (ref 70–99)
Potassium: 6.1 mmol/L — ABNORMAL HIGH (ref 3.5–5.1)
Sodium: 133 mmol/L — ABNORMAL LOW (ref 135–145)
Total Bilirubin: 1.3 mg/dL — ABNORMAL HIGH (ref 0.3–1.2)
Total Protein: 5.6 g/dL — ABNORMAL LOW (ref 6.5–8.1)

## 2020-03-03 LAB — BRAIN NATRIURETIC PEPTIDE: B Natriuretic Peptide: 2454 pg/mL — ABNORMAL HIGH (ref 0.0–100.0)

## 2020-03-03 MED ORDER — ASPIRIN EC 81 MG PO TBEC
81.0000 mg | DELAYED_RELEASE_TABLET | Freq: Every day | ORAL | Status: DC
  Administered 2020-03-03 – 2020-03-12 (×9): 81 mg via ORAL
  Filled 2020-03-03 (×10): qty 1

## 2020-03-03 MED ORDER — ENOXAPARIN SODIUM 40 MG/0.4ML ~~LOC~~ SOLN: 40.0000 mg | SUBCUTANEOUS | Status: DC

## 2020-03-03 MED ORDER — FOLIC ACID 1 MG PO TABS
1.0000 mg | ORAL_TABLET | Freq: Every day | ORAL | Status: DC
  Administered 2020-03-03 – 2020-03-12 (×10): 1 mg via ORAL
  Filled 2020-03-03 (×11): qty 1

## 2020-03-03 MED ORDER — FUROSEMIDE 10 MG/ML IJ SOLN
40.0000 mg | Freq: Four times a day (QID) | INTRAMUSCULAR | Status: DC
  Administered 2020-03-03 – 2020-03-04 (×2): 40 mg via INTRAVENOUS
  Filled 2020-03-03 (×3): qty 4

## 2020-03-03 MED ORDER — FUROSEMIDE 10 MG/ML IJ SOLN
40.0000 mg | Freq: Once | INTRAMUSCULAR | Status: AC
  Administered 2020-03-03: 17:00:00 40 mg via INTRAVENOUS
  Filled 2020-03-03: qty 4

## 2020-03-03 MED ORDER — ATENOLOL 25 MG PO TABS
25.0000 mg | ORAL_TABLET | Freq: Every day | ORAL | Status: DC
  Administered 2020-03-04: 10:00:00 25 mg via ORAL
  Filled 2020-03-03 (×2): qty 1

## 2020-03-03 MED ORDER — ATORVASTATIN CALCIUM 80 MG PO TABS
80.0000 mg | ORAL_TABLET | Freq: Every day | ORAL | Status: DC
  Administered 2020-03-04 – 2020-03-11 (×8): 80 mg via ORAL
  Filled 2020-03-03 (×9): qty 1

## 2020-03-03 MED ORDER — VENLAFAXINE HCL ER 150 MG PO CP24
150.0000 mg | ORAL_CAPSULE | Freq: Every day | ORAL | Status: DC
  Administered 2020-03-04 – 2020-03-11 (×8): 150 mg via ORAL
  Filled 2020-03-03 (×10): qty 1

## 2020-03-03 MED ORDER — PANTOPRAZOLE SODIUM 40 MG PO TBEC
40.0000 mg | DELAYED_RELEASE_TABLET | Freq: Every day | ORAL | Status: DC
  Administered 2020-03-03 – 2020-03-12 (×10): 40 mg via ORAL
  Filled 2020-03-03 (×10): qty 1

## 2020-03-03 MED ORDER — HYDRALAZINE HCL 25 MG PO TABS
25.0000 mg | ORAL_TABLET | Freq: Three times a day (TID) | ORAL | Status: DC
  Administered 2020-03-03 – 2020-03-05 (×5): 25 mg via ORAL
  Filled 2020-03-03 (×5): qty 1

## 2020-03-03 MED ORDER — NICOTINE 21 MG/24HR TD PT24
21.0000 mg | MEDICATED_PATCH | Freq: Every day | TRANSDERMAL | Status: AC
  Administered 2020-03-04 – 2020-03-07 (×4): 21 mg via TRANSDERMAL
  Filled 2020-03-03 (×5): qty 1

## 2020-03-03 MED ORDER — METHYLPREDNISOLONE SODIUM SUCC 125 MG IJ SOLR
125.0000 mg | Freq: Once | INTRAMUSCULAR | Status: AC
  Administered 2020-03-03: 16:00:00 125 mg via INTRAVENOUS
  Filled 2020-03-03: qty 2

## 2020-03-03 MED ORDER — SODIUM CHLORIDE 0.9% FLUSH
3.0000 mL | Freq: Two times a day (BID) | INTRAVENOUS | Status: DC
  Administered 2020-03-03 – 2020-03-04 (×3): 3 mL via INTRAVENOUS

## 2020-03-03 MED ORDER — ACETAMINOPHEN 325 MG PO TABS
650.0000 mg | ORAL_TABLET | ORAL | Status: DC | PRN
  Administered 2020-03-04 – 2020-03-05 (×2): 650 mg via ORAL
  Filled 2020-03-03 (×2): qty 2

## 2020-03-03 MED ORDER — SODIUM CHLORIDE 0.9 % IV SOLN: 250.0000 mL | INTRAVENOUS | Status: DC | PRN

## 2020-03-03 MED ORDER — BUTALBITAL-APAP-CAFFEINE 50-325-40 MG PO TABS: 1.0000 | ORAL_TABLET | Freq: Two times a day (BID) | ORAL | Status: DC | PRN

## 2020-03-03 MED ORDER — ALISKIREN FUMARATE 150 MG PO TABS
150.0000 mg | ORAL_TABLET | ORAL | Status: DC
  Administered 2020-03-03: 23:00:00 150 mg via ORAL
  Filled 2020-03-03: qty 1

## 2020-03-03 MED ORDER — ENOXAPARIN SODIUM 40 MG/0.4ML ~~LOC~~ SOLN
40.0000 mg | SUBCUTANEOUS | Status: DC
  Administered 2020-03-03 – 2020-03-10 (×8): 40 mg via SUBCUTANEOUS
  Filled 2020-03-03 (×8): qty 0.4

## 2020-03-03 MED ORDER — ONDANSETRON HCL 4 MG/2ML IJ SOLN
4.0000 mg | Freq: Four times a day (QID) | INTRAMUSCULAR | Status: DC | PRN
  Administered 2020-03-09: 4 mg via INTRAVENOUS
  Filled 2020-03-03: qty 2

## 2020-03-03 MED ORDER — ALBUTEROL SULFATE HFA 108 (90 BASE) MCG/ACT IN AERS
1.0000 | INHALATION_SPRAY | Freq: Once | RESPIRATORY_TRACT | Status: AC
  Administered 2020-03-03: 16:00:00 2 via RESPIRATORY_TRACT
  Filled 2020-03-03: qty 6.7

## 2020-03-03 MED ORDER — SODIUM CHLORIDE 0.9% FLUSH: 3.0000 mL | INTRAVENOUS | Status: DC | PRN

## 2020-03-03 NOTE — ED Provider Notes (Signed)
[facility] EMERGENCY DEPARTMENT Provider Note   CSN: [id number] Arrival date & time: 03/03/20  1511     History Chief Complaint  Patient presents with   Shortness of Breath    Jordan [name] is a 69 y.o. male possible history of COPD, diabetes, GERD, hyperlipidemia, hypertension, chronic venous insufficiency who presents for evaluation of shortness of breath, generalized weakness and fatigue x2 weeks.  He feels like over the last several days, the shortness of breath has gotten worse.  He states that it is now occurring when he is at rest and with movement.  No associated chest pain.  He does feel like he is wheezing.  He has not had any cough.  He states he may have a history of COPD but does not use any inhalers at home.  He states that he has bilateral lower extremity edema at baseline.  He feels like they have been slightly more swollen over the last 2 weeks.  No overlying warmth, erythema.  No fevers, chills.  He called EMS today when the difficulty breathing was getting worse.  Patient with no history of oxygen requirement.  On EMS arrival, patient was satting at 80% on room air.  He was placed on placed on 3 L nasal cannula with improvement in O2 sats.  Patient was also given 300 mL of normal saline.  Patient states he has gotten vaccinated for Covid.  No known Covid exposure that he knows of.  He denies any chest pain, abdominal pain, nausea/vomiting.  He does smoke.  He states he smokes 2 packs of cigarettes a day.   The history is provided by the patient.       Past Medical History:  Diagnosis Date   Acute CHF (congestive heart failure) ([place]) 03/03/2020   Anxiety    Arthritis    COPD (chronic obstructive pulmonary disease) ([facility])    Depression    Diabetes mellitus without complication ([facility])    GERD (gastroesophageal reflux disease)    Hyperlipidemia    Hypertension    Migraine    Obesity    Sleep apnea    CPAP    Varicose veins      Patient Active Problem List   Diagnosis Date Noted   Acute CHF (congestive heart failure) ([place]) 03/03/2020   Tobacco abuse 03/03/2020   Pressure injury of skin 03/15/2017   Cellulitis, leg 03/14/2017   Cellulitis of both feet    Cellulitis of right leg 10/30/2014   Chronic venous insufficiency 04/28/2014   PAD (peripheral artery disease) ([place]) 01/21/2014   Varicose veins of lower extremities with other complications 50/53/9767   Diabetes ([place]) 12/31/2013   Hx of TIA (transient ischemic attack) and stroke 12/31/2013   HLD (hyperlipidemia) 12/31/2013   Severe obesity (BMI >= 40) ([place]) 12/31/2013   Cellulitis of leg, left 09/13/2012   272.4 09/13/2012   Essential hypertension, benign 09/13/2012   Edema 09/13/2012    Past Surgical History:  Procedure Laterality Date   ANKLE SURGERY Right    Dr. Marcelino Scot, has pins   KNEE ARTHROSCOPY     Dr. Noemi Chapel   right foot surgery,rt great toe callus removal     TONSILLECTOMY AND ADENOIDECTOMY     age 57       Family History  Problem Relation Age of Onset   Hypertension Mother    Alzheimer's disease Mother    Stroke Father        x 3   Heart attack Father  Congestive Heart Failure Father    Diabetes Unknown        mat great aunts   Colon cancer Neg Hx    Rectal cancer Neg Hx    Esophageal cancer Neg Hx     Social History   Tobacco Use   Smoking status: Current Some Day Smoker    Packs/day: 2.00    Years: 20.00    Pack years: 40.00    Types: Cigarettes   Smokeless tobacco: Never Used  Vaping Use   Vaping Use: Never used  Substance Use Topics   Alcohol use: No    Alcohol/week: 0.0 standard drinks   Drug use: No    Home Medications Prior to Admission medications   Medication Sig Start Date End Date Taking? Authorizing Provider  aliskiren (TEKTURNA) 300 MG tablet Take 150 mg by mouth every other day.     [provider]  amoxicillin-clavulanate (AUGMENTIN) 875-125 MG  tablet Take 1 tablet by mouth every 12 (twelve) hours. 03/20/17   Donne Hazel, MD  aspirin 81 MG tablet Take 81 mg by mouth daily.    [provider]  atenolol (TENORMIN) 25 MG tablet Take 1 tablet (25 mg total) by mouth daily. 11/04/14   [name], MD  atorvastatin (LIPITOR) 80 MG tablet Take 80 mg by mouth daily after supper.     [provider]  butalbital-acetaminophen-caffeine (FIORICET, ESGIC) 50-325-40 MG per tablet Take 1 tablet by mouth 2 (two) times daily as needed for pain or headache.    [provider]  doxycycline (VIBRA-TABS) 100 MG tablet Take 1 tablet (100 mg total) by mouth every 12 (twelve) hours. 03/20/17   Donne Hazel, MD  folic acid (FOLVITE) 1 MG tablet Take 1 tablet (1 mg total) by mouth daily. 11/04/14   [name], MD  furosemide (LASIX) 40 MG tablet Take 40 mg by mouth daily as needed for fluid.     [provider]  hydrALAZINE (APRESOLINE) 25 MG tablet Take 1 tablet (25 mg total) by mouth every 8 (eight) hours. 11/04/14   [name], MD  Multiple Vitamin (MULTIVITAMIN WITH MINERALS) TABS Take 1 tablet by mouth daily.    [provider]  naphazoline-glycerin (CLEAR EYES) 0.012-0.2 % SOLN Place 1 drop into both eyes daily.    [provider]  omeprazole (PRILOSEC) 20 MG capsule Take 20 mg by mouth daily.    [provider]  venlafaxine XR (EFFEXOR-XR) 75 MG 24 hr capsule Take 150 mg by mouth daily after supper. BRAND NAME ONLY    [provider]    Allergies    Altace [ramipril], Codeine, and Naproxen  Review of Systems   Review of Systems  Constitutional: Positive for fatigue. Negative for fever.  Respiratory: Positive for shortness of breath. Negative for cough.   Cardiovascular: Negative for chest pain.  Gastrointestinal: Negative for abdominal pain, nausea and vomiting.  Genitourinary: Negative for dysuria and hematuria.  Neurological: Positive for weakness (generalized).  Negative for headaches.  All other systems reviewed and are negative.   Physical Exam Updated Vital Signs BP (!) 157/62    Pulse (!) 45    Temp 97.7 F (36.5 C) (Oral)    Resp 14    Ht 6' (1.829 m)    Wt 102.1 kg    SpO2 95%    BMI 30.52 kg/m   Physical Exam Vitals and nursing note reviewed.  Constitutional:      Appearance: Normal appearance. He is well-developed.  HENT:     Head: Normocephalic and atraumatic.  Eyes:     General: Lids are normal.     Conjunctiva/sclera: Conjunctivae normal.     Pupils: Pupils are equal, round, and reactive to light.  Cardiovascular:     Rate and Rhythm: Regular rhythm. Bradycardia present.     Pulses: Normal pulses.     Heart sounds: Normal heart sounds. No murmur heard.  No friction rub. No gallop.   Pulmonary:     Effort: Tachypnea present.     Breath sounds: Wheezing present.     Comments: Tachypnea and increased work of breathing noted.  Diffuse expiratory wheezing noted throughout all lung fields.  Speaking in medium sentences. Abdominal:     Palpations: Abdomen is soft. Abdomen is not rigid.     Tenderness: There is no abdominal tenderness. There is no guarding.     Comments: Abdomen is soft, non-distended, non-tender. No rigidity, No guarding. No peritoneal signs.  Musculoskeletal:        General: Normal range of motion.     Cervical back: Full passive range of motion without pain.     Comments: Bilateral lower extremities with nonpitting edema.    Skin:    General: Skin is warm and dry.     Capillary Refill: Capillary refill takes less than 2 seconds.     Comments: He has overlying scaly skin with chronic venous stasis changes noted bilateral lower extremities.  No overlying warmth, erythema.  Neurological:     Mental Status: He is alert and oriented to person, place, and time.  Psychiatric:        Speech: Speech normal.     ED Results / Procedures / Treatments   Labs (all labs ordered are listed, but only abnormal results  are displayed) Labs Reviewed  COMPREHENSIVE METABOLIC PANEL - Abnormal; Notable for the following components:      Result Value   Sodium 133 (*)    Potassium 6.1 (*)    Chloride 97 (*)    BUN 25 (*)    Calcium 8.5 (*)    Total Protein 5.6 (*)    Albumin 3.1 (*)    Alkaline Phosphatase 151 (*)    Total Bilirubin 1.3 (*)    All other components within normal limits  BRAIN NATRIURETIC PEPTIDE - Abnormal; Notable for the following components:   B Natriuretic Peptide 2,454.0 (*)    All other components within normal limits  SARS CORONAVIRUS 2 BY RT PCR (HOSPITAL ORDER, [facility])  CBC WITH DIFFERENTIAL/PLATELET    EKG EKG Interpretation  Date/Time:  Tuesday March 03 2020 15:26:52 EDT Ventricular Rate:  59 PR Interval:    QRS Duration: 99 QT Interval:  457 QTC Calculation: 453 R Axis:   56 Text Interpretation: Sinus rhythm Borderline repolarization abnormality Confirmed by Veryl Speak [phone]) on 03/03/2020 3:51:30 PM   Radiology DG Chest Portable 1 View  Result Date: 03/03/2020 CLINICAL DATA:  Shortness of breath EXAM: PORTABLE CHEST 1 VIEW COMPARISON:  Radiograph 05/07/2010 FINDINGS: Shallow inspiratory effort with some atelectatic changes however there are more diffuse interstitial opacities throughout both lungs with more patchy airspace opacities in the lung bases. Vascularity is cephalized, indistinct. Few septal lines. Bilateral pleural effusions are present as well. No pneumothorax. Cardiac silhouette appears enlarged accounting for the portable technique and patient rotation likely reflecting either cardiomegaly or a pericardial effusion. The aorta is calcified. The remaining cardiomediastinal contours are unremarkable. No acute osseous or soft tissue  abnormality. Degenerative changes are present in the imaged spine and shoulders. Telemetry leads overlie the chest. IMPRESSION: Features most likely reflect CHF/volume overload with cardiomegaly,  interstitial and alveolar edema and bilateral effusions. Underlying infection is not excluded. Electronically Signed   By: Lovena Le M.D.   On: 03/03/2020 16:14    Procedures Procedures (including critical care time)  Medications Ordered in ED Medications  methylPREDNISolone sodium succinate (SOLU-MEDROL) 125 mg/2 mL injection 125 mg (125 mg Intravenous Given 03/03/20 1603)  albuterol (VENTOLIN HFA) 108 (90 Base) MCG/ACT inhaler 1-2 puff (2 puffs Inhalation Given 03/03/20 1603)  furosemide (LASIX) injection 40 mg (40 mg Intravenous Given 03/03/20 1645)    ED Course  I have reviewed the triage vital signs and the nursing notes.  Pertinent labs & imaging results that were available during my care of the patient were reviewed by me and considered in my medical decision making (see chart for details).    MDM Rules/Calculators/A&P                          69 y.o. M with PMH/o COPD who presents for evaluation of SOB and generalized weakness x 2 weeks. Worse over the last few days. H/o COPD but no inhalers. H/o chronic venous stasis changes. On EMS arrival, he was hypoxic with O2 sats at 80%. Improved with 3L O2.  On initial ED arrival, he is bradycardic.  I reviewed his records and it shows that he consistently has pulse rates between 50s and 60s.  He was hypertensive.  He is tachypneic.  O2 sats are ranging 96-98% on 3 L O2.  On exam, he has diffuse expiratory wheezing.  He also has swelling noted to bilateral lower extremities with evidence of chronic venous stasis changes.  Question if this is COPD exacerbation versus CHF or mixed component.  Also consider Covid.  We will plan to check labs, chest x-ray.  Patient given Solu-Medrol, albuterol.  BNP is elevated 2454.  CMP shows potassium of 6.1, BUN of 25, creatinine 1.10.  Covid negative.  CBC without any significant leukocytosis or anemia.  Patient given 40 mg IV Lasix.  He has Lasix listed as his medication but he states he was told that his  leg swelling was due more to chronic venous stasis changes.  Reevaluation.  He appears more comfortable after Lasix and Solu-Medrol, albuterol.  He still has slightly tachypneic and has some shortness of breath with exertion.  He is still requiring 2 L of oxygen to maintain 95 sats. He has not had any ECHO since 2018.  At this time, given acute exacerbation of heart failure, hyperkalemia, feel that patient needs admitted for continued diuresis.  Discussed patient with Dr. Crissie Sickles (hospitalist) who accepts patient for admission.   Portions of this note were generated with [employer]. Dictation errors may occur despite best attempts at proofreading.  Final Clinical Impression(s) / ED Diagnoses Final diagnoses:  Acute on chronic congestive heart failure, unspecified heart failure type ([place])  Hyperkalemia    Rx / DC Orders ED Discharge Orders    None       Desma Mcgregor 03/03/20 1853    Veryl Speak, MD 03/03/20 2321

## 2020-03-03 NOTE — H&P (Signed)
History and Physical    Jordan [name] HQI:[id number] DOB: 06-28-1950 DOA: 03/03/2020  PCP: [name], MD   Patient coming from: home  I have personally briefly reviewed patient's old medical records in [place]  Chief Complaint: SOB/DOE  HPI: Jordan [name] is a 69 y.o. male with medical history significant of COPD, diabetes on no medications, GERD, hyperlipidemia, hypertension, chronic venous insufficiency who presents for evaluation of shortness of breath, generalized weakness and fatigue x2 weeks.  He feels like over the last several days, the shortness of breath has gotten worse.  He states that it is now occurring when he is at rest and with movement.  No associated chest pain.  He does feel like he is wheezing.  He has not had any cough.  He states he may have a history of COPD but does not use any inhalers at home.  He states that he has bilateral lower extremity edema at baseline.  He feels like they have been slightly more swollen over the last 2 weeks. Has a hx of cellulitis but  no overlying warmth, erythema.  No fevers, chills.  He called EMS today when the difficulty breathing was getting worse.  Patient with no history of oxygen requirement.  On EMS arrival, patient was satting at 80% on room air.  He was placed on placed on 3 L nasal cannula with improvement in O2 sats.  Patient was also given 300 mL of normal saline.  Patient states he has gotten vaccinated for Covid.  No known Covid exposure that he knows of.  He denies any chest pain, abdominal pain, nausea/vomiting.  He does smoke.  He states he smokes 2 packs of cigarettes a day.    ED Course: T97.7  157/62  HR 45  RR 14. Patient on evaluation by ED-PA was SOB and wheezing. O2 sat stablized on low flow oxygen. Lab revealed  K 6.1, Cr 1.1, T Protein 5.6(l), CBC w/ Diff nl, BNP 2,454. Covid negative. CXR with septal lines, diffuse interstial opacities, cardiomegaly, bilateral pleural effusions. TRH is called to  admit patient for continue management of acute CHF and further diagnostic evaluation.  Review of Systems: As per HPI otherwise 10 point review of systems negative.    Past Medical History:  Diagnosis Date  . Acute CHF (congestive heart failure) ([place]) 03/03/2020  . Anxiety   . Arthritis   . COPD (chronic obstructive pulmonary disease) ([place])   . Depression   . Diabetes mellitus without complication ([place])   . GERD (gastroesophageal reflux disease)   . Hyperlipidemia   . Hypertension   . Migraine   . Obesity   . Sleep apnea    CPAP   . Varicose veins     Past Surgical History:  Procedure Laterality Date  . ANKLE SURGERY Right    Dr. Marcelino Scot, has pins  . KNEE ARTHROSCOPY     Dr. Noemi Chapel  . right foot surgery,rt great toe callus removal    . TONSILLECTOMY AND ADENOIDECTOMY     age 43    Soc Hx - single man. Has good neighbors and friends. Parents are deceased. 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 Lives alone. Is I-ADLs.    reports that he has been smoking cigarettes. He has a 40.00 pack-year smoking history. He has never used smokeless tobacco. He reports that he does not drink alcohol and does not use drugs.  Allergies  Allergen Reactions  . Altace [Ramipril]     Dizziness, migraine, weakness  . Codeine Itching  . Naproxen Itching    Family History  Problem Relation Age of Onset  . Hypertension Mother   . Alzheimer's disease Mother   . Stroke Father        x 3  . Heart attack Father   . Congestive Heart Failure Father   . Diabetes Unknown        mat great aunts  . Colon cancer Neg Hx   . Rectal cancer Neg Hx   . Esophageal cancer Neg Hx      Prior to Admission medications   Medication Sig Start Date End Date Taking? Authorizing Provider  aliskiren (TEKTURNA) 300 MG tablet Take 150 mg by mouth every other day.     [provider]  amoxicillin-clavulanate (AUGMENTIN) 875-125 MG tablet  Take 1 tablet by mouth every 12 (twelve) hours. 03/20/17   Donne Hazel, MD  aspirin 81 MG tablet Take 81 mg by mouth daily.    [provider]  atenolol (TENORMIN) 25 MG tablet Take 1 tablet (25 mg total) by mouth daily. 11/04/14   [name], MD  atorvastatin (LIPITOR) 80 MG tablet Take 80 mg by mouth daily after supper.     [provider]  butalbital-acetaminophen-caffeine (FIORICET, ESGIC) 50-325-40 MG per tablet Take 1 tablet by mouth 2 (two) times daily as needed for pain or headache.    [provider]  doxycycline (VIBRA-TABS) 100 MG tablet Take 1 tablet (100 mg total) by mouth every 12 (twelve) hours. 03/20/17   Donne Hazel, MD  folic acid (FOLVITE) 1 MG tablet Take 1 tablet (1 mg total) by mouth daily. 11/04/14   [name], MD  furosemide (LASIX) 40 MG tablet Take 40 mg by mouth daily as needed for fluid.     [provider]  hydrALAZINE (APRESOLINE) 25 MG tablet Take 1 tablet (25 mg total) by mouth every 8 (eight) hours. 11/04/14   [name], MD  Multiple Vitamin (MULTIVITAMIN WITH MINERALS) TABS Take 1 tablet by mouth daily.    [provider]  naphazoline-glycerin (CLEAR EYES) 0.012-0.2 % SOLN Place 1 drop into both eyes daily.    [provider]  omeprazole (PRILOSEC) 20 MG capsule Take 20 mg by mouth daily.    [provider]  venlafaxine XR (EFFEXOR-XR) 75 MG 24 hr capsule Take 150 mg by mouth daily after supper. BRAND NAME ONLY    [provider]    Physical Exam: Vitals:   03/03/20 1527 03/03/20 1540 03/03/20 1645 03/03/20 1800  BP: (!) 174/56  (!) 161/47 (!) 157/62  Pulse: (!) 49  (!) 44 (!) 45  Resp: 17   14  Temp: 97.7 F (36.5 C)     TempSrc: Oral     SpO2: 100%  95% 95%  Weight:  102.1 kg    Height:  6' (1.829 m)       Vitals:   03/03/20 1527 03/03/20 1540 03/03/20 1645 03/03/20 1800  BP: (!) 174/56  (!) 161/47 (!) 157/62  Pulse: (!) 49  (!) 44 (!) 45  Resp: 17   14  Temp:  97.7 F (36.5 C)     TempSrc: Oral     SpO2: 100%  95% 95%  Weight:  102.1 kg    Height:  6' (1.829 m)     General: WNWD man looking older than stated age in no distress Eyes: PERRL, lids  and conjunctivae normal ENMT: Mucous membranes are moist. Posterior pharynx clear of any exudate or lesions. Missing most of his teeth. No oral lesions. Neck: normal, supple, no masses, no thyromegaly Respiratory: No increased WOB at rest: no accessory muscle use, no neck retractions. No wheezing. Diminished breath sounds throughout, no rales. Prolonged expiratory phase  Cardiovascular: Regular bradycardia with occasional pause. High pitched holosystolic mm at the apex, LSB and less prominently at the RSB. No  rubs / gallops. 3+ pitting edema lower extremities  With skin changes c/w chronic venous insuffiency. Trace pedal pulse right, 1+ pedal pulse left. Slow capillary refill right.  No carotid bruits.  Abdomen: Flat abdomen, no tenderness, no masses palpated. No hepatosplenomegaly. Bowel sounds positive.  Musculoskeletal: no clubbing.Dark skin changes distal LE. Marland Kitchen No joint deformity upper and lower extremities. Good ROM, no contractures. Decreased muscle tone.  Skin: Venous stasis changes bilateral LE, scaling skin changes, hypertrophic toenails, thick horny skin build up heels. Neurologic: CN 2-12 grossly intact.   Psychiatric: Normal judgment and insight. Alert and oriented x 3. Normal mood.     Labs on Admission: I have personally reviewed following labs and imaging studies  CBC: Recent Labs  Lab 03/03/20 1600  WBC 7.6  NEUTROABS 5.5  HGB 13.4  HCT 44.2  MCV 95.9  PLT 284   Basic Metabolic Panel: Recent Labs  Lab 03/03/20 1600  NA 133*  K 6.1*  CL 97*  CO2 28  GLUCOSE 87  BUN 25*  CREATININE 1.10  CALCIUM 8.5*   GFR: Estimated Creatinine Clearance: 78.4 mL/min (by C-G formula based on SCr of 1.1 mg/dL). Liver Function Tests: Recent Labs  Lab 03/03/20 1600  AST 25  ALT 13   ALKPHOS 151*  BILITOT 1.3*  PROT 5.6*  ALBUMIN 3.1*   No results for input(s): LIPASE, AMYLASE in the last 168 hours. No results for input(s): AMMONIA in the last 168 hours. Coagulation Profile: No results for input(s): INR, PROTIME in the last 168 hours. Cardiac Enzymes: No results for input(s): CKTOTAL, CKMB, CKMBINDEX, TROPONINI in the last 168 hours. BNP (last 3 results) No results for input(s): PROBNP in the last 8760 hours. HbA1C: No results for input(s): HGBA1C in the last 72 hours. CBG: No results for input(s): GLUCAP in the last 168 hours. Lipid Profile: No results for input(s): CHOL, HDL, LDLCALC, TRIG, CHOLHDL, LDLDIRECT in the last 72 hours. Thyroid Function Tests: No results for input(s): TSH, T4TOTAL, FREET4, T3FREE, THYROIDAB in the last 72 hours. Anemia Panel: No results for input(s): VITAMINB12, FOLATE, FERRITIN, TIBC, IRON, RETICCTPCT in the last 72 hours. Urine analysis: No results found for: COLORURINE, APPEARANCEUR, LABSPEC, [place], GLUCOSEU, HGBUR, BILIRUBINUR, [place], [place], [place], NITRITE, LEUKOCYTESUR  Radiological Exams on Admission: DG Chest Portable 1 View  Result Date: 03/03/2020 CLINICAL DATA:  Shortness of breath EXAM: PORTABLE CHEST 1 VIEW COMPARISON:  Radiograph 05/07/2010 FINDINGS: Shallow inspiratory effort with some atelectatic changes however there are more diffuse interstitial opacities throughout both lungs with more patchy airspace opacities in the lung bases. Vascularity is cephalized, indistinct. Few septal lines. Bilateral pleural effusions are present as well. No pneumothorax. Cardiac silhouette appears enlarged accounting for the portable technique and patient rotation likely reflecting either cardiomegaly or a pericardial effusion. The aorta is calcified. The remaining cardiomediastinal contours are unremarkable. No acute osseous or soft tissue abnormality. Degenerative changes are present in the imaged spine and shoulders.  Telemetry leads overlie the chest. IMPRESSION: Features most likely reflect CHF/volume overload with cardiomegaly, interstitial and alveolar edema  and bilateral effusions. Underlying infection is not excluded. Electronically Signed   By: Lovena Le M.D.   On: 03/03/2020 16:14    EKG: Independently reviewed. Sinus bradycardia aat 59. LVH.  Assessment/Plan Active Problems:   Acute CHF (congestive heart failure) (HCC)   Essential hypertension, benign   Diabetes ([place])   Tobacco abuse   HLD (hyperlipidemia)   Chronic venous insufficiency  (please populate well all problems here in Problem List. (For example, if patient is on BP meds at home and you resume or decide to hold them, it is a problem that needs to be her. Same for CAD, COPD, HLD and so on)   1. Acute CHF - no prior h/o CHF per patient. No h/o CAD, no MI. Had Echo 03/14/17 with EF 02-77%, grade 1 diastolic dysfunction. Now with elevated BNP, pulmonary edema by CXR, SOB with hypoxemia on room air.  Plan Cardiac tele admit  IV lasix 40 mg q6 x 4 doses  Continue oxygen to keep O2 sat >90%  2 D echo  CHF protocol  F/u CXR   May need cardiology consult based on response to therapy and results of Echo  2. Pulmonary - patient with heavy smoking h/o but carries no diagnosis of COPD. Decreased BS 2/2 CHF, may also have component of emphysema. Currently not wheezing. Suspect cardiac asthma as primary cause of wheezing with no indication for continued bronchodilators.   Plan CT chest for further evaluation following diuresis  3. Tobacco abuse - discussed with patient. He knows the importance of cessation  Plan Nicotine patch 21 mg 16/24 hrs per day  4.  HTN - will continue home medications with changes as in #1  5. HLD - continue home medications  6. Chronic venous insufficiency - follows with Dr. Sharol Given, orthopedics. No evidence of cellulitis or skin breakdown at admission.  Plan  resume use of compression stocking following  diuresis.   7. DM-  Patient reports last A1C at Dr. Baldwin Crown office was <6%. He has lost over 100 lbs in the last 3 years by intention. He was able to come of medical therapy.  Plan Low carb diet.   DVT prophylaxis: Lovenox  Code Status: full code  Family Communication: Attempted to call Angelena Sole, patient's aunt but got no answer.  Disposition Plan: TBD- expect > 48 hr hospitalization  Consults called: none  Admission status: cardiac tele    Adella Hare MD [facility] Pager [phone]  If 7PM-7AM, please contact night-coverage [email or web address] Password [facility]  03/03/2020, 7:09 PM

## 2020-03-03 NOTE — ED Notes (Signed)
Pt attempted to use urinal by himself and spilled the urinal and discombulated himself with cords and removed his O2. Pt then called out using the cal bell and this RN found urine all over floor and pt back in bed with oxygen sats on 68 % on room air. This RN then stated pt to always use the call beol especially needing help with toileting  needs and place Vici back on pt at 5L, will titrate back down to 3L.

## 2020-03-03 NOTE — ED Triage Notes (Signed)
Assume care from EMS , where ems reports pt was seen at PCP today for SOB where pt became hypoxic with o2 sats 80% on room air, pt was then place on 3l [place] with sats improved. EMS reports pt was tested for covid at pcp office with a negative result and pt has an hx of chf and copd and wears no baseline oxygen at home. EMS gave pt 370ml of ns, pt was place on the monitor x 3

## 2020-03-04 ENCOUNTER — Inpatient Hospital Stay (HOSPITAL_COMMUNITY): Payer: Medicare PPO

## 2020-03-04 ENCOUNTER — Encounter (HOSPITAL_COMMUNITY): Payer: Self-pay | Admitting: Internal Medicine

## 2020-03-04 ENCOUNTER — Other Ambulatory Visit: Payer: Self-pay

## 2020-03-04 DIAGNOSIS — I5031 Acute diastolic (congestive) heart failure: Secondary | ICD-10-CM

## 2020-03-04 LAB — BASIC METABOLIC PANEL
Anion gap: 11 (ref 5–15)
BUN: 26 mg/dL — ABNORMAL HIGH (ref 8–23)
CO2: 30 mmol/L (ref 22–32)
Calcium: 8.1 mg/dL — ABNORMAL LOW (ref 8.9–10.3)
Chloride: 93 mmol/L — ABNORMAL LOW (ref 98–111)
Creatinine, Ser: 1.16 mg/dL (ref 0.61–1.24)
GFR calc Af Amer: >60 mL/min (ref >60)
GFR calc non Af Amer: >60 mL/min (ref >60)
Glucose, Bld: 106 mg/dL — ABNORMAL HIGH (ref 70–99)
Potassium: 5 mmol/L (ref 3.5–5.1)
Sodium: 134 mmol/L — ABNORMAL LOW (ref 135–145)

## 2020-03-04 LAB — ECHOCARDIOGRAM COMPLETE
AR max vel: 0.66 cm2
AV Area VTI: 0.68 cm2
AV Area mean vel: 0.61 cm2
AV Mean grad: 36.5 mmHg
AV Peak grad: 58.8 mmHg
Ao pk vel: 3.84 m/s
Area-P 1/2: 2.65 cm2
Height: 73 in
P 1/2 time: 524 msec
S' Lateral: 4 cm
Weight: 3600 oz

## 2020-03-04 LAB — GLUCOSE, CAPILLARY
Glucose-Capillary: 40 mg/dL — CL (ref 70–99)
Glucose-Capillary: 60 mg/dL — ABNORMAL LOW (ref 70–99)
Glucose-Capillary: 105 mg/dL — ABNORMAL HIGH (ref 70–99)
Glucose-Capillary: 110 mg/dL — ABNORMAL HIGH (ref 70–99)

## 2020-03-04 MED ORDER — PERFLUTREN LIPID MICROSPHERE
1.0000 mL | INTRAVENOUS | Status: AC | PRN
  Administered 2020-03-04: 3 mL via INTRAVENOUS
  Filled 2020-03-04: qty 10

## 2020-03-04 MED ORDER — FUROSEMIDE 10 MG/ML IJ SOLN
40.0000 mg | Freq: Three times a day (TID) | INTRAMUSCULAR | Status: DC
  Administered 2020-03-04 – 2020-03-05 (×3): 40 mg via INTRAVENOUS
  Filled 2020-03-04 (×2): qty 4

## 2020-03-04 MED ORDER — BUTALBITAL-APAP-CAFFEINE 50-325-40 MG PO TABS: 1.0000 | ORAL_TABLET | Freq: Two times a day (BID) | ORAL | Status: DC | PRN

## 2020-03-04 MED ORDER — HYDROCERIN EX CREA
TOPICAL_CREAM | Freq: Three times a day (TID) | CUTANEOUS | Status: DC
  Administered 2020-03-04 – 2020-03-07 (×3): 1 via TOPICAL
  Filled 2020-03-04 (×2): qty 113

## 2020-03-04 MED ORDER — INFLUENZA VAC A&B SA ADJ QUAD 0.5 ML IM PRSY
0.5000 mL | PREFILLED_SYRINGE | INTRAMUSCULAR | Status: AC
  Administered 2020-03-09: 0.5 mL via INTRAMUSCULAR
  Filled 2020-03-04 (×2): qty 0.5

## 2020-03-04 NOTE — Progress Notes (Signed)
Orthopedic Tech Progress Note Patient Details:  Jordan [name] June 15, 1951 [id number]  Ortho Devices Type of Ortho Device: Louretta Parma boot Ortho Device/Splint Location: BLE Ortho Device/Splint Interventions: Ordered, Application   Post Interventions Patient Tolerated: Well Instructions Provided: Care of device   [name] 03/04/2020, 5:37 PM

## 2020-03-04 NOTE — Progress Notes (Signed)
ReDS Clip Diuretic Study Pt study # [id number]  Your patient has been enrolled in the ReDS Clip Diuretic Study   Changes to prescribed diuretics recommended:  Complete current furosemide order of 40 mg IV q6h x 4 doses (ends this afternoon), then start furosemide 40 mg IV TID  Provider contacted: Dr. Posey Pronto  Recommendation was accepted by provider.   REDS Clip  READING= 43%  CHEST RULER = 33 Clip Station = D   Orthodema score = 2 Signs/Symptoms Score   Mild edema, no orthopnea 0 No congestion  Moderate edema, no orthopnea 1 Low-grade orthodema/congestion  Severe edema OR orthopnea 2   Moderate edema and orthopnea 3 High-grade orthodema/congestion  Severe edema AND orthopnea 4    Kerby Nora, PharmD, BCPS Phone [phone] 03/04/2020       9:18 AM  Please check [email or web address] for unit-specific pharmacist phone numbers

## 2020-03-04 NOTE — ED Notes (Signed)
Called ortho tech requesting bilateral unna boots

## 2020-03-04 NOTE — Progress Notes (Signed)
[facility] Progress Note  Patient: Jordan [name]    RUE:[id number]  DOA: 03/03/2020     Date of Service: the patient was seen and examined on 03/04/2020  Brief hospital course: Past medical history of COPD, type II DM, GERD, HLD, HTN, chronic venous insufficiency.  Presents with complaints of acute on chronic diastolic CHF. Currently plan is continue IV diuresis.  Assessment and Plan: 1.  Acute on chronic diastolic CHF. Acute hypoxic respiratory failure POA EF 55 to 60%. No wall motion abnormality. Moderate to severe aortic valve stenosis.  Prior echocardiogram did not show any significant stenosis. We will consult cardiology for outpatient follow-up. Currently participating in rads vest study. Continue with IV Lasix 40 3 times daily.\ Also continue hydralazine.  Holding other medications to allow for blood pressure room.  2.  Sinus bradycardia. Essential hypertension. On atenolol. Due to sinus bradycardia currently holding.  3.  Hyperlipidemia Continue Lipitor.  4.  Active smoker Nicotine patch.  5.  Chronic venous insufficiency Follows up with Dr. Sharol Given. Unna boots. Monitor.  6.  Type 2 diabetes mellitus, uncontrolled with hypoglycemia without any complication CBG every 4 hours for now.  May require D10 infusion.  Changing diet  Diet: Cardiac diet DVT Prophylaxis:   enoxaparin (LOVENOX) injection 40 mg Start: 03/03/20 2200    Advance goals of care discussion: Full code  Family Communication: no family was present at bedside, at the time of interview.   Disposition:  Status is: Inpatient  Remains inpatient appropriate because:Inpatient level of care appropriate due to severity of illness   Dispo: The patient is from: Home              Anticipated d/c is to: Home              Anticipated d/c date is: 3 days              Patient currently is not medically stable to d/c.  Subjective: Continues to have shortness of breath.  Also has dry cough.  No  nausea no vomiting.  No fever no chills.  Swelling swelling in the leg.  Dry skin.  Physical Exam:  General: Appear in mild distress, dry skin, no rash; Oral Mucosa Clear, moist. no Abnormal Neck Mass Or lumps, Conjunctiva normal  Cardiovascular: S1 and S2 Present, no Murmur, Respiratory: increased respiratory effort, Bilateral Air entry present and bilateral  Crackles, no wheezes Abdomen: Bowel Sound present, Soft and no tenderness Extremities: bilateral  Pedal edema Neurology: alert and oriented to time, place, and person affect appropriate. no new focal deficit Gait not checked due to patient safety concerns  Vitals:   03/04/20 1200 03/04/20 1500 03/04/20 1600 03/04/20 1700  BP: (!) 134/47 (!) 101/35 (!) 123/44 (!) 127/40  Pulse: (!) 49 (!) 42 (!) 43 (!) 44  Resp: 15 16 17 14   Temp:      TempSrc:      SpO2: 98% 97% 99% 97%  Weight:      Height:        Intake/Output Summary (Last 24 hours) at 03/04/2020 1922 Last data filed at 03/04/2020 1616 Gross per 24 hour  Intake --  Output 2400 ml  Net -2400 ml   Filed Weights   03/03/20 1540 03/04/20 0407  Weight: 102.1 kg 102.1 kg    Data Reviewed: I have personally reviewed and interpreted daily labs, tele strips, imagings as discussed above. I reviewed all nursing notes, pharmacy notes, vitals, pertinent old records I have discussed plan  of care as described above with RN and patient/family.  CBC: Recent Labs  Lab 03/03/20 1600  WBC 7.6  NEUTROABS 5.5  HGB 13.4  HCT 44.2  MCV 95.9  PLT 970   Basic Metabolic Panel: Recent Labs  Lab 03/03/20 1600 03/04/20 0440  NA 133* 134*  K 6.1* 5.0  CL 97* 93*  CO2 28 30  GLUCOSE 87 106*  BUN 25* 26*  CREATININE 1.10 1.16  CALCIUM 8.5* 8.1*    Studies: ECHOCARDIOGRAM COMPLETE  Result Date: 03/04/2020    ECHOCARDIOGRAM REPORT   Patient Name:   STERLING MONDO Date of Exam: 03/04/2020 Medical Rec #:  [id number]         Height:       73.0 in Accession #:    [id number]         Weight:       225.0 lb Date of Birth:  [birth year]         BSA:          2.262 m Patient Age:    69 years          BP:           137/47 mmHg Patient Gender: M                 HR:           50 bpm. Exam Location:  Inpatient Procedure: 2D Echo, Color Doppler, Cardiac Doppler and Intracardiac            Opacification Agent Indications:     M76.72 Acute diastolic (congestive) heart failure  History:         Patient has prior history of Echocardiogram examinations, most                  recent 03/14/2017. CHF, PAD and COPD; Risk Factors:Hypertension,                  Diabetes, Dyslipidemia, Sleep Apnea and Current Smoker.  Sonographer:     Raquel Sarna Senior RDCS Referring Phys:  [place] Diagnosing Phys: Mertie Moores MD  Sonographer Comments: Technically difficult due to COPD. IMPRESSIONS  1. Left ventricular ejection fraction, by estimation, is 55 to 60%. The left ventricle has normal function. The left ventricle has no regional wall motion abnormalities. Left ventricular diastolic function could not be evaluated.  2. Right ventricular systolic function is normal. The right ventricular size is normal.  3. Left atrial size was moderately dilated.  4. Right atrial size was mildly dilated.  5. Large pleural effusion in the left lateral region.  6. The mitral valve is normal in structure. No evidence of mitral valve regurgitation.  7. The aortic valve is grossly normal. There is moderate calcification of the aortic valve. There is moderate thickening of the aortic valve. Aortic valve regurgitation is mild. Moderate to severe aortic valve stenosis. FINDINGS  Left Ventricle: Left ventricular ejection fraction, by estimation, is 55 to 60%. The left ventricle has normal function. The left ventricle has no regional wall motion abnormalities. Definity contrast agent was given IV to delineate the left ventricular  endocardial borders. The left ventricular internal cavity size was normal in size. There is no left  ventricular hypertrophy. Left ventricular diastolic function could not be evaluated due to atrial fibrillation. Left ventricular diastolic function could  not be evaluated. Right Ventricle: The right ventricular size is normal. No increase in right ventricular wall thickness. Right ventricular systolic function is normal.  Left Atrium: Left atrial size was moderately dilated. Right Atrium: Right atrial size was mildly dilated. Pericardium: There is no evidence of pericardial effusion. Mitral Valve: The mitral valve is normal in structure. No evidence of mitral valve regurgitation. Tricuspid Valve: The tricuspid valve is grossly normal. Tricuspid valve regurgitation is not demonstrated. Aortic Valve: The aortic valve is grossly normal. There is moderate calcification of the aortic valve. There is moderate thickening of the aortic valve. There is moderate aortic valve annular calcification. Aortic valve regurgitation is mild. Aortic regurgitation PHT measures 524 msec. Moderate to severe aortic stenosis is present. Aortic valve mean gradient measures 36.5 mmHg. Aortic valve peak gradient measures 58.8 mmHg. Aortic valve area, by VTI measures 0.68 cm. Pulmonic Valve: The pulmonic valve was not well visualized. Pulmonic valve regurgitation is not visualized. Aorta: The aortic root and ascending aorta are structurally normal, with no evidence of dilitation. IAS/Shunts: The atrial septum is grossly normal. Additional Comments: There is a large pleural effusion in the left lateral region.  LEFT VENTRICLE PLAX 2D LVIDd:         5.70 cm  Diastology LVIDs:         4.00 cm  LV e' medial:    5.00 cm/s LV PW:         1.10 cm  LV E/e' medial:  16.8 LV IVS:        0.90 cm  LV e' lateral:   7.94 cm/s LVOT diam:     2.00 cm  LV E/e' lateral: 10.6 LV SV:         70 LV SV Index:   31 LVOT Area:     3.14 cm  RIGHT VENTRICLE RV S prime:     8.92 cm/s TAPSE (M-mode): 1.8 cm LEFT ATRIUM            Index       RIGHT ATRIUM           Index  LA diam:      5.20 cm  2.30 cm/m  RA Area:     20.10 cm LA Vol (A2C): 66.8 ml  29.53 ml/m RA Volume:   64.90 ml  28.69 ml/m LA Vol (A4C): 103.0 ml 45.53 ml/m  AORTIC VALVE AV Area (Vmax):    0.66 cm AV Area (Vmean):   0.61 cm AV Area (VTI):     0.68 cm AV Vmax:           383.50 cm/s AV Vmean:          285.000 cm/s AV VTI:            1.030 m AV Peak Grad:      58.8 mmHg AV Mean Grad:      36.5 mmHg LVOT Vmax:         80.23 cm/s LVOT Vmean:        55.267 cm/s LVOT VTI:          0.224 m LVOT/AV VTI ratio: 0.22 AI PHT:            524 msec  AORTA Ao Root diam: 3.00 cm MITRAL VALVE MV Area (PHT): 2.65 cm    SHUNTS MV Decel Time: 286 msec    Systemic VTI:  0.22 m MV E velocity: 84.00 cm/s  Systemic Diam: 2.00 cm MV A velocity: 26.10 cm/s MV E/A ratio:  3.22 Mertie Moores MD Electronically signed by Mertie Moores MD Signature Date/Time: 03/04/2020/1:06:57 PM    Final (Updated)     Scheduled  Meds:  aspirin EC  81 mg Oral Daily   atorvastatin  80 mg Oral QPC supper   enoxaparin (LOVENOX) injection  40 mg Subcutaneous X50H   folic acid  1 mg Oral Daily   furosemide  40 mg Intravenous Q8H   hydrALAZINE  25 mg Oral Q8H   hydrocerin   Topical TID   [START ON 03/05/2020] influenza vaccine adjuvanted  0.5 mL Intramuscular Tomorrow-1000   nicotine  21 mg Transdermal Daily   pantoprazole  40 mg Oral Daily   sodium chloride flush  3 mL Intravenous Q12H   venlafaxine XR  150 mg Oral QPC supper   Continuous Infusions:  sodium chloride     PRN Meds: sodium chloride, acetaminophen, butalbital-acetaminophen-caffeine, ondansetron (ZOFRAN) IV, sodium chloride flush  Time spent: 35 minutes  Author: Berle Mull, MD [facility] 03/04/2020 7:22 PM  To reach On-call, see care teams to locate the attending and reach out via [email or web address]. Between 7PM-7AM, please contact night-coverage If you still have difficulty reaching the attending provider, please page the [facility] (Director on Call) for  [facility] on amion for assistance.

## 2020-03-04 NOTE — Progress Notes (Signed)
Echocardiogram 2D Echocardiogram has been performed.  Oneal Deputy Zophia Marrone 03/04/2020, 11:19 AM

## 2020-03-04 NOTE — Evaluation (Signed)
Physical Therapy Evaluation Patient Details Name: Jordan [name] MRN: [id number] DOB: Aug 13, 1950 Today's Date: 03/04/2020   History of Present Illness  Pt is a 69 y/o male admited secondary to increased weakness and SOB. Thought to be from acute CHF. PMH includes COPD, DM, HTN, and tobacco use.   Clinical Impression  Pt admitted secondary to problem above with deficits below. Tolerated mobility within ED room well. Oxygen sats >92% on 3L of oxygen this session. Required min to min guard A for transfers and gait. Pt reports he lives alone but has good support from family, friends and neighbors. Will continue to follow acutely to maximize functional mobility independence and safety.     Follow Up Recommendations Home health PT    Equipment Recommendations  Other (comment) (TBD)    Recommendations for Other Services       Precautions / Restrictions Precautions Precautions: Fall Restrictions Weight Bearing Restrictions: No      Mobility  Bed Mobility Overal bed mobility: Needs Assistance Bed Mobility: Supine to Sit;Sit to Supine     Supine to sit: Supervision Sit to supine: Supervision   General bed mobility comments: Supervision for safety. Increased time required.   Transfers Overall transfer level: Needs assistance Equipment used: 1 person hand held assist Transfers: Sit to/from Stand Sit to Stand: Min assist         General transfer comment: Min A for lift assist and steadying assist.   Ambulation/Gait Ambulation/Gait assistance: Min guard Gait Distance (Feet): 15 Feet Assistive device: 1 person hand held assist Gait Pattern/deviations: Step-through pattern;Decreased stride length Gait velocity: Decreased   General Gait Details: Mild instability noted, however, no LOB. Min guard for safety. Oxygen sats >92% on 3L throughout.   Stairs            Wheelchair Mobility    Modified Rankin (Stroke Patients Only)       Balance Overall balance  assessment: Needs assistance Sitting-balance support: No upper extremity supported;Feet supported Sitting balance-Leahy Scale: Fair     Standing balance support: Single extremity supported;During functional activity Standing balance-Leahy Scale: Poor Standing balance comment: Reliant on at least 1 UE support                              Pertinent Vitals/Pain Pain Assessment: 0-10 Pain Score: 7  Pain Location: L big toe Pain Descriptors / Indicators: Throbbing Pain Intervention(s): Limited activity within patient's tolerance;Monitored during session;Repositioned    Home Living Family/patient expects to be discharged to:: Private residence Living Arrangements: Alone Available Help at Discharge: Family;Neighbor;Friend(s) Type of Home: House Home Access: Stairs to enter Entrance Stairs-Rails: Right Entrance Stairs-Number of Steps: 8 Home Layout: One level Home Equipment: Shower seat - built in;Cane - single point;Walker - 2 wheels      Prior Function Level of Independence: Independent with assistive device(s)         Comments: uses cane for ambulation, still drives.      Hand Dominance        Extremity/Trunk Assessment   Upper Extremity Assessment Upper Extremity Assessment: Defer to OT evaluation    Lower Extremity Assessment Lower Extremity Assessment: Generalized weakness    Cervical / Trunk Assessment Cervical / Trunk Assessment: Kyphotic  Communication   Communication: No difficulties  Cognition Arousal/Alertness: Awake/alert Behavior During Therapy: WFL for tasks assessed/performed Overall Cognitive Status: Within Functional Limits for tasks assessed  General Comments      Exercises     Assessment/Plan    PT Assessment Patient needs continued PT services  PT Problem List Decreased strength;Cardiopulmonary status limiting activity;Decreased balance;Decreased activity  tolerance;Decreased mobility;Decreased knowledge of use of DME       PT Treatment Interventions Gait training;DME instruction;Therapeutic activities;Functional mobility training;Balance training;Therapeutic exercise;Stair training;Patient/family education    PT Goals (Current goals can be found in the Care Plan section)  Acute Rehab PT Goals Patient Stated Goal: to go home PT Goal Formulation: With patient Time For Goal Achievement: 03/18/20 Potential to Achieve Goals: Good    Frequency Min 3X/week   Barriers to discharge        Co-evaluation               AM-PAC PT "6 Clicks" Mobility  Outcome Measure Help needed turning from your back to your side while in a flat bed without using bedrails?: None Help needed moving from lying on your back to sitting on the side of a flat bed without using bedrails?: None Help needed moving to and from a bed to a chair (including a wheelchair)?: A Little Help needed standing up from a chair using your arms (e.g., wheelchair or bedside chair)?: A Little Help needed to walk in hospital room?: A Little Help needed climbing 3-5 steps with a railing? : A Lot 6 Click Score: 19    End of Session Equipment Utilized During Treatment: Gait belt;Oxygen Activity Tolerance: Patient tolerated treatment well Patient left: in bed;with call bell/phone within reach (in ED ) Nurse Communication: Mobility status PT Visit Diagnosis: Unsteadiness on feet (R26.81);Muscle weakness (generalized) (M62.81)    Time: 1572-6203 PT Time Calculation (min) (ACUTE ONLY): 13 min   Charges:   PT Evaluation $PT Eval Low Complexity: 1 Low          Lou Miner, DPT  Acute Rehabilitation Services  Pager: [phone] Office: [phone]   Rudean Hitt 03/04/2020, 5:37 PM

## 2020-03-04 NOTE — ED Notes (Signed)
Ortho at bedside.

## 2020-03-05 ENCOUNTER — Ambulatory Visit: Payer: Medicare PPO | Admitting: Physician Assistant

## 2020-03-05 DIAGNOSIS — I350 Nonrheumatic aortic (valve) stenosis: Secondary | ICD-10-CM

## 2020-03-05 DIAGNOSIS — E43 Unspecified severe protein-calorie malnutrition: Secondary | ICD-10-CM | POA: Insufficient documentation

## 2020-03-05 DIAGNOSIS — Z720 Tobacco use: Secondary | ICD-10-CM

## 2020-03-05 DIAGNOSIS — I5033 Acute on chronic diastolic (congestive) heart failure: Secondary | ICD-10-CM

## 2020-03-05 LAB — BASIC METABOLIC PANEL
Anion gap: 8 (ref 5–15)
Anion gap: 12 (ref 5–15)
BUN: 34 mg/dL — ABNORMAL HIGH (ref 8–23)
BUN: 35 mg/dL — ABNORMAL HIGH (ref 8–23)
CO2: 32 mmol/L (ref 22–32)
CO2: 30 mmol/L (ref 22–32)
Calcium: 7.7 mg/dL — ABNORMAL LOW (ref 8.9–10.3)
Calcium: 7.8 mg/dL — ABNORMAL LOW (ref 8.9–10.3)
Chloride: 97 mmol/L — ABNORMAL LOW (ref 98–111)
Chloride: 95 mmol/L — ABNORMAL LOW (ref 98–111)
Creatinine, Ser: 1.64 mg/dL — ABNORMAL HIGH (ref 0.61–1.24)
Creatinine, Ser: 1.57 mg/dL — ABNORMAL HIGH (ref 0.61–1.24)
GFR calc Af Amer: 49 mL/min — ABNORMAL LOW (ref >60)
GFR calc Af Amer: 51 mL/min — ABNORMAL LOW (ref >60)
GFR calc non Af Amer: 42 mL/min — ABNORMAL LOW (ref >60)
GFR calc non Af Amer: 44 mL/min — ABNORMAL LOW (ref >60)
Glucose, Bld: 90 mg/dL (ref 70–99)
Glucose, Bld: 73 mg/dL (ref 70–99)
Potassium: 4 mmol/L (ref 3.5–5.1)
Potassium: 4.6 mmol/L (ref 3.5–5.1)
Sodium: 137 mmol/L (ref 135–145)
Sodium: 137 mmol/L (ref 135–145)

## 2020-03-05 LAB — MAGNESIUM: Magnesium: 1.8 mg/dL (ref 1.7–2.4)

## 2020-03-05 LAB — CBC
HCT: 37.5 % — ABNORMAL LOW (ref 39.0–52.0)
Hemoglobin: 12 g/dL — ABNORMAL LOW (ref 13.0–17.0)
MCH: 29.7 pg (ref 26.0–34.0)
MCHC: 32 g/dL (ref 30.0–36.0)
MCV: 92.8 fL (ref 80.0–100.0)
Platelets: 189 10*3/uL (ref 150–400)
RBC: 4.04 MIL/uL — ABNORMAL LOW (ref 4.22–5.81)
RDW: 14.8 % (ref 11.5–15.5)
WBC: 10.6 10*3/uL — ABNORMAL HIGH (ref 4.0–10.5)
nRBC: 0 % (ref 0.0–0.2)

## 2020-03-05 LAB — GLUCOSE, CAPILLARY
Glucose-Capillary: 101 mg/dL — ABNORMAL HIGH (ref 70–99)
Glucose-Capillary: 137 mg/dL — ABNORMAL HIGH (ref 70–99)
Glucose-Capillary: 170 mg/dL — ABNORMAL HIGH (ref 70–99)
Glucose-Capillary: 86 mg/dL (ref 70–99)
Glucose-Capillary: 89 mg/dL (ref 70–99)
Glucose-Capillary: 90 mg/dL (ref 70–99)

## 2020-03-05 LAB — HEMOGLOBIN A1C
Hgb A1c MFr Bld: 5.3 % (ref 4.8–5.6)
Mean Plasma Glucose: 105.41 mg/dL

## 2020-03-05 MED ORDER — SODIUM CHLORIDE 0.9 % WEIGHT BASED INFUSION
3.0000 mL/kg/h | INTRAVENOUS | Status: AC
  Administered 2020-03-06: 3 mL/kg/h via INTRAVENOUS

## 2020-03-05 MED ORDER — ENSURE ENLIVE PO LIQD
237.0000 mL | Freq: Two times a day (BID) | ORAL | Status: DC
  Administered 2020-03-05 – 2020-03-11 (×7): 237 mL via ORAL

## 2020-03-05 MED ORDER — SODIUM CHLORIDE 0.9% FLUSH: 3.0000 mL | INTRAVENOUS | Status: DC | PRN

## 2020-03-05 MED ORDER — FUROSEMIDE 40 MG PO TABS: 40.0000 mg | ORAL_TABLET | Freq: Two times a day (BID) | ORAL | Status: DC

## 2020-03-05 MED ORDER — LORAZEPAM 0.5 MG PO TABS
0.5000 mg | ORAL_TABLET | Freq: Four times a day (QID) | ORAL | Status: DC | PRN
  Administered 2020-03-05 – 2020-03-11 (×7): 0.5 mg via ORAL
  Filled 2020-03-05 (×7): qty 1

## 2020-03-05 MED ORDER — ASPIRIN 81 MG PO CHEW
81.0000 mg | CHEWABLE_TABLET | ORAL | Status: AC
  Administered 2020-03-06: 81 mg via ORAL
  Filled 2020-03-05: qty 1

## 2020-03-05 MED ORDER — FUROSEMIDE 10 MG/ML IJ SOLN: 40.0000 mg | Freq: Every day | INTRAMUSCULAR | Status: DC

## 2020-03-05 MED ORDER — SODIUM CHLORIDE 0.9% FLUSH
3.0000 mL | Freq: Two times a day (BID) | INTRAVENOUS | Status: DC
  Administered 2020-03-06 – 2020-03-11 (×9): 3 mL via INTRAVENOUS

## 2020-03-05 MED ORDER — SODIUM CHLORIDE 0.9 % IV SOLN: 250.0000 mL | INTRAVENOUS | Status: DC | PRN

## 2020-03-05 MED ORDER — ADULT MULTIVITAMIN W/MINERALS CH
1.0000 | ORAL_TABLET | Freq: Every day | ORAL | Status: DC
  Administered 2020-03-05 – 2020-03-12 (×8): 1 via ORAL
  Filled 2020-03-05 (×9): qty 1

## 2020-03-05 MED ORDER — SODIUM CHLORIDE 0.9 % WEIGHT BASED INFUSION: 1.0000 mL/kg/h | INTRAVENOUS | Status: DC

## 2020-03-05 NOTE — Consult Note (Addendum)
Cardiology Consultation:   Patient ID: Jordan [name] MRN: [id number]; DOB: [birth year]  Admit date: 03/03/2020 Date of Consult: 03/05/2020  Primary Care Provider: [name], MD [facility] Cardiologist: New   Patient Profile:   Jordan [name] is a 69 y.o. male with a hx of hypertension, hyperlipidemia, diabetes mellitus, COPD with ongoing tobacco smoking and sleep apnea who is being seen today for the evaluation of CHF and aortic stenosis at the request of Dr. Posey Pronto.  History of Present Illness:   Jordan [name] presented to hospital 9/14 with few weeks history of progressive worsening shortness of breath orthopnea PND and fatigue.  Upon arrival he was noted hypoxic.  Heart rate of 45.  Blood pressure elevated.  He was placed on nasal cannula with improved symptoms.  He was admitted for acute hypoxic respiratory failure in setting of CHF exacerbation.  He was treated with  IV Lasix 40 mg twice daily.  He diuresed total 6.1 L with 4 L yesterday.  Weight down 17 pounds from 225-208 today.  Due to worsening renal function today to 1.64 his Lasix discontinued.  Echocardiogram showed normal LV function and moderate to severe aortic stenosis.  Cardiology is asked for further evaluation.  No prior cardiac history.  He reported greater than 42-month history of progressive worsening dyspnea on exertion.  No associated chest tightness.  He also had intermittent orthopnea, PND and lower extremity edema with worsening symptoms in past few weeks leading to ER evaluation.  He also has intermittent dizziness and palpitation.  No syncope.   Father with history of CVA x3, CHF and CAD.  He smoked 2 pack of cigarettes for 40 years and now cutting back, currently smoking few cigarettes a day.   Past Medical History:  Diagnosis Date  . Acute CHF (congestive heart failure) ([place]) 03/03/2020  . Anxiety   . Arthritis   . COPD (chronic obstructive pulmonary disease) ([place])   . Depression   . Diabetes  mellitus without complication ([place])   . GERD (gastroesophageal reflux disease)   . Hyperlipidemia   . Hypertension   . Migraine   . Obesity   . Sleep apnea    CPAP   . Varicose veins     Past Surgical History:  Procedure Laterality Date  . ANKLE SURGERY Right    Dr. Marcelino Scot, has pins  . KNEE ARTHROSCOPY     Dr. Noemi Chapel  . right foot surgery,rt great toe callus removal    . TONSILLECTOMY AND ADENOIDECTOMY     age 71     Inpatient Medications: Scheduled Meds: . aspirin EC  81 mg Oral Daily  . atorvastatin  80 mg Oral QPC supper  . enoxaparin (LOVENOX) injection  40 mg Subcutaneous Q24H  . folic acid  1 mg Oral Daily  . hydrocerin   Topical TID  . influenza vaccine adjuvanted  0.5 mL Intramuscular Tomorrow-1000  . nicotine  21 mg Transdermal Daily  . pantoprazole  40 mg Oral Daily  . sodium chloride flush  3 mL Intravenous Q12H  . sodium chloride flush  3 mL Intravenous Q12H  . venlafaxine XR  150 mg Oral QPC supper   Continuous Infusions: . sodium chloride     PRN Meds: sodium chloride, acetaminophen, butalbital-acetaminophen-caffeine, ondansetron (ZOFRAN) IV, sodium chloride flush  Allergies:    Allergies  Allergen Reactions  . Altace [Ramipril]     Dizziness, migraine, weakness  . Codeine Itching  . Naproxen Itching    Social History:   Social  History   Socioeconomic History  . Marital status: Single    Spouse name: Not on file  . Number of children: 0  . Years of education: Not on file  . Highest education level: Not on file  Occupational History  . Occupation: retired Youth worker: RETIRED  Tobacco Use  . Smoking status: Current Some Day Smoker    Packs/day: 2.00    Years: 20.00    Pack years: 40.00    Types: Cigarettes  . Smokeless tobacco: Never Used  Vaping Use  . Vaping Use: Never used  Substance and Sexual Activity  . Alcohol use: No    Alcohol/week: 0.0 standard drinks  . Drug use: No  . Sexual activity: Yes     Birth control/protection: Condom  Other Topics Concern  . Not on file  Social History Narrative  . Not on file   Social Determinants of Health   Financial Resource Strain:   . Difficulty of Paying Living Expenses: Not on file  Food Insecurity:   . Worried About Charity fundraiser in the Last Year: Not on file  . Ran Out of Food in the Last Year: Not on file  Transportation Needs:   . Lack of Transportation (Medical): Not on file  . Lack of Transportation (Non-Medical): Not on file  Physical Activity:   . Days of Exercise per Week: Not on file  . Minutes of Exercise per Session: Not on file  Stress:   . Feeling of Stress : Not on file  Social Connections:   . Frequency of Communication with Friends and Family: Not on file  . Frequency of Social Gatherings with Friends and Family: Not on file  . Attends Religious Services: Not on file  . Active Member of Clubs or Organizations: Not on file  . Attends Archivist Meetings: Not on file  . Marital Status: Not on file  Intimate Partner Violence:   . Fear of Current or Ex-Partner: Not on file  . Emotionally Abused: Not on file  . Physically Abused: Not on file  . Sexually Abused: Not on file    Family History:   Family History  Problem Relation Age of Onset  . Hypertension Mother   . Alzheimer's disease Mother   . Stroke Father        x 3  . Heart attack Father   . Congestive Heart Failure Father   . Diabetes Other        mat great aunts  . Colon cancer Neg Hx   . Rectal cancer Neg Hx   . Esophageal cancer Neg Hx      ROS:  Please see the history of present illness.  All other ROS reviewed and negative.     Physical Exam/Data:   Vitals:   03/04/20 1900 03/04/20 2135 03/05/20 0008 03/05/20 0524  BP: (!) 123/47 (!) 125/53 (!) 134/54 (!) 113/41  Pulse: (!) 54  76 (!) 47  Resp: 16  16 16   Temp: 98.1 F (36.7 C)  98.2 F (36.8 C) 98 F (36.7 C)  TempSrc: Oral  Oral Oral  SpO2: 100%  92% 92%  Weight:     94.6 kg  Height:        Intake/Output Summary (Last 24 hours) at 03/05/2020 1148 Last data filed at 03/05/2020 0500 Gross per 24 hour  Intake --  Output 3400 ml  Net -3400 ml   Last 3 Weights 03/05/2020 03/04/2020 03/03/2020  Weight (lbs) 208  lb 8.9 oz 225 lb 225 lb  Weight (kg) 94.6 kg 102.059 kg 102.059 kg     Body mass index is 27.52 kg/m.  General:  Well nourished, well developed, in no acute distress HEENT: normal Lymph: no adenopathy Neck: no JVD Endocrine:  No thryomegaly Vascular: No carotid bruits; FA pulses 2+ bilaterally without bruits  Cardiac: Sinus bradycardia S1, S2; RRR; 3 out of 6 systolic murmur at right upper sternal border Lungs:  clear to auscultation bilaterally, no wheezing, rhonchi or rales  Abd: soft, nontender, no hepatomegaly  Ext: 1+ edema with Ace wrap Musculoskeletal:  No deformities, BUE and BLE strength normal and equal Skin: warm and dry  Neuro:  CNs 2-12 intact, no focal abnormalities noted Psych:  Normal affect   EKG:  The EKG was personally reviewed and demonstrates: Sinus bradycardia at rate of 59 bpm, nonspecific T wave inversion in inferior leads Telemetry:  Telemetry was personally reviewed and demonstrates: Sinus bradycardia in 50s  Relevant CV Studies:  Echo 03/04/20 1. Left ventricular ejection fraction, by estimation, is 55 to 60%. The  left ventricle has normal function. The left ventricle has no regional  wall motion abnormalities. Left ventricular diastolic function could not  be evaluated.  2. Right ventricular systolic function is normal. The right ventricular  size is normal.  3. Left atrial size was moderately dilated.  4. Right atrial size was mildly dilated.  5. Large pleural effusion in the left lateral region.  6. The mitral valve is normal in structure. No evidence of mitral valve  regurgitation.  7. The aortic valve is grossly normal. There is moderate calcification of  the aortic valve. There is moderate  thickening of the aortic valve. Aortic  valve regurgitation is mild. Moderate to severe aortic valve stenosis.   Laboratory Data:  Chemistry Recent Labs  Lab 03/03/20 1600 03/04/20 0440 03/05/20 0456  NA 133* 134* 137  K 6.1* 5.0 4.0  CL 97* 93* 97*  CO2 28 30 32  GLUCOSE 87 106* 90  BUN 25* 26* 34*  CREATININE 1.10 1.16 1.64*  CALCIUM 8.5* 8.1* 7.7*  GFRNONAA >60 >60 42*  GFRAA >60 >60 49*  ANIONGAP 8 11 8     Recent Labs  Lab 03/03/20 1600  PROT 5.6*  ALBUMIN 3.1*  AST 25  ALT 13  ALKPHOS 151*  BILITOT 1.3*   Hematology Recent Labs  Lab 03/03/20 1600 03/05/20 0456  WBC 7.6 10.6*  RBC 4.61 4.04*  HGB 13.4 12.0*  HCT 44.2 37.5*  MCV 95.9 92.8  MCH 29.1 29.7  MCHC 30.3 32.0  RDW 14.6 14.8  PLT 169 189   BNP Recent Labs  Lab 03/03/20 1600  BNP 2,454.0*    Radiology/Studies:  DG Chest Portable 1 View  Result Date: 03/03/2020 CLINICAL DATA:  Shortness of breath EXAM: PORTABLE CHEST 1 VIEW COMPARISON:  Radiograph 05/07/2010 FINDINGS: Shallow inspiratory effort with some atelectatic changes however there are more diffuse interstitial opacities throughout both lungs with more patchy airspace opacities in the lung bases. Vascularity is cephalized, indistinct. Few septal lines. Bilateral pleural effusions are present as well. No pneumothorax. Cardiac silhouette appears enlarged accounting for the portable technique and patient rotation likely reflecting either cardiomegaly or a pericardial effusion. The aorta is calcified. The remaining cardiomediastinal contours are unremarkable. No acute osseous or soft tissue abnormality. Degenerative changes are present in the imaged spine and shoulders. Telemetry leads overlie the chest. IMPRESSION: Features most likely reflect CHF/volume overload with cardiomegaly, interstitial and alveolar edema  and bilateral effusions. Underlying infection is not excluded. Electronically Signed   By: Lovena Le M.D.   On: 03/03/2020 16:14    ECHOCARDIOGRAM COMPLETE  Result Date: 03/04/2020    ECHOCARDIOGRAM REPORT   Patient Name:   Jordan [name] Date of Exam: 03/04/2020 Medical Rec #:  [id number]         Height:       73.0 in Accession #:    [id number]        Weight:       225.0 lb Date of Birth:  1950/12/30         BSA:          2.262 m Patient Age:    39 years          BP:           137/47 mmHg Patient Gender: M                 HR:           50 bpm. Exam Location:  Inpatient Procedure: 2D Echo, Color Doppler, Cardiac Doppler and Intracardiac            Opacification Agent Indications:     X73.53 Acute diastolic (congestive) heart failure  History:         Patient has prior history of Echocardiogram examinations, most                  recent 03/14/2017. CHF, PAD and COPD; Risk Factors:Hypertension,                  Diabetes, Dyslipidemia, Sleep Apnea and Current Smoker.  Sonographer:     Raquel Sarna Senior RDCS Referring Phys:  [place] Diagnosing Phys: Mertie Moores MD  Sonographer Comments: Technically difficult due to COPD. IMPRESSIONS  1. Left ventricular ejection fraction, by estimation, is 55 to 60%. The left ventricle has normal function. The left ventricle has no regional wall motion abnormalities. Left ventricular diastolic function could not be evaluated.  2. Right ventricular systolic function is normal. The right ventricular size is normal.  3. Left atrial size was moderately dilated.  4. Right atrial size was mildly dilated.  5. Large pleural effusion in the left lateral region.  6. The mitral valve is normal in structure. No evidence of mitral valve regurgitation.  7. The aortic valve is grossly normal. There is moderate calcification of the aortic valve. There is moderate thickening of the aortic valve. Aortic valve regurgitation is mild. Moderate to severe aortic valve stenosis. FINDINGS  Left Ventricle: Left ventricular ejection fraction, by estimation, is 55 to 60%. The left ventricle has normal function. The left  ventricle has no regional wall motion abnormalities. Definity contrast agent was given IV to delineate the left ventricular  endocardial borders. The left ventricular internal cavity size was normal in size. There is no left ventricular hypertrophy. Left ventricular diastolic function could not be evaluated due to atrial fibrillation. Left ventricular diastolic function could  not be evaluated. Right Ventricle: The right ventricular size is normal. No increase in right ventricular wall thickness. Right ventricular systolic function is normal. Left Atrium: Left atrial size was moderately dilated. Right Atrium: Right atrial size was mildly dilated. Pericardium: There is no evidence of pericardial effusion. Mitral Valve: The mitral valve is normal in structure. No evidence of mitral valve regurgitation. Tricuspid Valve: The tricuspid valve is grossly normal. Tricuspid valve regurgitation is not demonstrated. Aortic Valve: The aortic valve is grossly  normal. There is moderate calcification of the aortic valve. There is moderate thickening of the aortic valve. There is moderate aortic valve annular calcification. Aortic valve regurgitation is mild. Aortic regurgitation PHT measures 524 msec. Moderate to severe aortic stenosis is present. Aortic valve mean gradient measures 36.5 mmHg. Aortic valve peak gradient measures 58.8 mmHg. Aortic valve area, by VTI measures 0.68 cm. Pulmonic Valve: The pulmonic valve was not well visualized. Pulmonic valve regurgitation is not visualized. Aorta: The aortic root and ascending aorta are structurally normal, with no evidence of dilitation. IAS/Shunts: The atrial septum is grossly normal. Additional Comments: There is a large pleural effusion in the left lateral region.  LEFT VENTRICLE PLAX 2D LVIDd:         5.70 cm  Diastology LVIDs:         4.00 cm  LV e' medial:    5.00 cm/s LV PW:         1.10 cm  LV E/e' medial:  16.8 LV IVS:        0.90 cm  LV e' lateral:   7.94 cm/s LVOT diam:      2.00 cm  LV E/e' lateral: 10.6 LV SV:         70 LV SV Index:   31 LVOT Area:     3.14 cm  RIGHT VENTRICLE RV S prime:     8.92 cm/s TAPSE (M-mode): 1.8 cm LEFT ATRIUM            Index       RIGHT ATRIUM           Index LA diam:      5.20 cm  2.30 cm/m  RA Area:     20.10 cm LA Vol (A2C): 66.8 ml  29.53 ml/m RA Volume:   64.90 ml  28.69 ml/m LA Vol (A4C): 103.0 ml 45.53 ml/m  AORTIC VALVE AV Area (Vmax):    0.66 cm AV Area (Vmean):   0.61 cm AV Area (VTI):     0.68 cm AV Vmax:           383.50 cm/s AV Vmean:          285.000 cm/s AV VTI:            1.030 m AV Peak Grad:      58.8 mmHg AV Mean Grad:      36.5 mmHg LVOT Vmax:         80.23 cm/s LVOT Vmean:        55.267 cm/s LVOT VTI:          0.224 m LVOT/AV VTI ratio: 0.22 AI PHT:            524 msec  AORTA Ao Root diam: 3.00 cm MITRAL VALVE MV Area (PHT): 2.65 cm    SHUNTS MV Decel Time: 286 msec    Systemic VTI:  0.22 m MV E velocity: 84.00 cm/s  Systemic Diam: 2.00 cm MV A velocity: 26.10 cm/s MV E/A ratio:  3.22 Mertie Moores MD Electronically signed by Mertie Moores MD Signature Date/Time: 03/04/2020/1:06:57 PM    Final (Updated)     New York Heart Association (NYHA) Functional Class NYHA Class II  Assessment and Plan:   1. Acute on chronic diastolic heart failure in setting of aortic stenosis Patient reported greater than 19-months history of dyspnea on exertion along with orthopnea, PND, and lower extremity edema.  Recently worsened in past few weeks.  He was admitted and diuresed almost 6  L.  Weight down 17 pounds.  Breathing improving.  Still has mild edema.  Diuretics held today  (already got 1 dose) due to worsen renal functions.  2. Moderate to severe aortic stenosis - He is symptomatic with this. Has noted exertional fatigue.  - Aortic valve mean gradient measures 36.5 mmHg. Aortic valve peak  gradient measures 58.8 mmHg. Aortic valve area, by VTI measures 0.68 cm. - Likely R  & L cath tomorrow depending on renal function  -  He has significant risk factors for underlying CAD  3. Sinus bradycardia - Last dose of atenolol 25mg  was yesterday. Follow closely. No pathologic brady arrhythmias.   4. HTN - BP stable - BB on hold as above  5. COPD - No active wheezing  6. Tobacco smoking - Cessation recommended  7. HLD - No results found for requested labs within last 8760 hours.  - Continue Lipitor 80mg  qd   Dr. Marlou Porch to see  For questions or updates, please contact [facility] Please consult [email or web address] for contact info under    Signed, Candee Furbish, MD  03/05/2020 11:48 AM   Personally seen and examined. Agree with above.   69 year old with longstanding tobacco history COPD here with acute diastolic heart failure.  Diuresed 6 L.  Creatinine did increase from 1.0-1.6 today.  Diuretics are on hold.  Still feeling some shortness of breath, not quite at baseline.  Echocardiogram personally reviewed demonstrates moderate to severe aortic stenosis with normal ejection fraction.  36 mmHg mean gradient.  Lab work reviewed as above.  EKG bradycardic with nonspecific ST-T wave changes.  Assessment and plan:  Moderate to severe aortic stenosis COPD Dyspnea on exertion Acute diastolic heart failure Former heavy smoker.-80-pack-year.  -Plan is to go ahead and perform a right and left heart catheterization tomorrow, Dr. Burt Knack.  Risks and benefits of been explained including stroke heart attack death renal impairment bleeding.  He is willing to proceed.  We will check a basic metabolic profile tomorrow morning to make sure that his creatinine is stabilized.  We are holding diuretics today. -Still has some lower extremity edema in place.  Murmur heard on exam of aortic stenosis. -Possibilities include severe triple-vessel coronary artery disease given his longstanding tobacco history and moderate to severe aortic stenosis which would require CABG and aortic valve replacement if felt to be an adequate  candidate.  We will continue to follow.  Candee Furbish, MD

## 2020-03-05 NOTE — Progress Notes (Addendum)
[facility] Progress Note  Patient: Jordan [name]    AJG:[id number]  DOA: 03/03/2020     Date of Service: the patient was seen and examined on 03/05/2020  Brief hospital course: Past medical history of COPD, type II DM, GERD, HLD, HTN, chronic venous insufficiency.  Presents with complaints of acute on chronic diastolic CHF. NOW HAS MILD RENAL FAILURE.  Currently plan is continue to monitor renal function and volume and follow up on Cardiology consult.  Assessment and Plan: 1.  Acute on chronic diastolic CHF. Acute hypoxic respiratory failure POA Moderate to severe aortic stenosis.  EF 55 to 60%. No wall motion abnormality. Moderate to severe aortic valve stenosis.  Prior echocardiogram did not show any significant stenosis. We will consult cardiology for outpatient follow-up. Currently participating in rads vest study. Treated with aggressive diuresis IV Lasix 40 3 times daily no on hold due to worsening renal function. PO tomorrow.  Also continue hydralazine.  Holding other medications to allow for blood pressure room. Cardiology consult.   2.  Sinus bradycardia. Essential hypertension. On atenolol. Due to sinus bradycardia currently holding.  3.  Hyperlipidemia Continue Lipitor.  4.  Active smoker Nicotine patch.  5.  Chronic venous insufficiency Follows up with Dr. Sharol Given. Unna boots. Edema significantly better.  Monitor.  6.  Type 2 diabetes mellitus, uncontrolled with hypoglycemia without any complication CBG in 35D,  Now CBG 170 CBG every 4 hours for now.  No insulin Check A1C  7.AKI Due to over diuresis  Now holding diuresis Monitor renal function  Avoid hypotension  No ANE I or ARB for now  8. Anemia, unspecified.  Hb baseline 11 Hb on admission 13.4 Hb today 12.  Not sure about etiology, no bleeding reported.  Monitor for now.   Diet: Cardiac diet DVT Prophylaxis:   enoxaparin (LOVENOX) injection 40 mg Start: 03/03/20 2200  Advance  goals of care discussion: Full code  Family Communication: no family was present at bedside, at the time of interview.   Disposition:  Status is: Inpatient  Remains inpatient appropriate because:Inpatient level of care appropriate due to severity of illness   Dispo: The patient is from: Home              Anticipated d/c is to: Home PTOT consulted               Anticipated d/c date is: 3 days              Patient currently is not medically stable to d/c.  Subjective: breathing better, no chest pain, edema better, no nausea or vomiting.   Physical Exam: General: Appear in mild distress, no Rash; Oral Mucosa Clear, moist. no Abnormal Neck Mass Or lumps, Conjunctiva normal  Cardiovascular: S1 and S2 Present, aortic systolic  Murmur, Respiratory: increased respiratory effort, Bilateral Air entry present and bilateral  Crackles, no wheezes Abdomen: Bowel Sound present, Soft and no tenderness Extremities: trace Pedal edema Neurology: alert and oriented to time, place, and person affect appropriate. no new focal deficit Gait not checked due to patient safety concerns  Vitals:   03/04/20 1900 03/04/20 2135 03/05/20 0008 03/05/20 0524  BP: (!) 123/47 (!) 125/53 (!) 134/54 (!) 113/41  Pulse: (!) 54  76 (!) 47  Resp: 16  16 16   Temp: 98.1 F (36.7 C)  98.2 F (36.8 C) 98 F (36.7 C)  TempSrc: Oral  Oral Oral  SpO2: 100%  92% 92%  Weight:    94.6 kg  Height:        Intake/Output Summary (Last 24 hours) at 03/05/2020 0836 Last data filed at 03/05/2020 0500 Gross per 24 hour  Intake --  Output 4000 ml  Net -4000 ml   Filed Weights   03/03/20 1540 03/04/20 0407 03/05/20 0524  Weight: 102.1 kg 102.1 kg 94.6 kg    Data Reviewed: I have personally reviewed and interpreted daily labs, tele strips, imagings as discussed above. I reviewed all nursing notes, pharmacy notes, vitals, pertinent old records I have discussed plan of care as described above with RN and  patient/family.  CBC: Recent Labs  Lab 03/03/20 1600 03/05/20 0456  WBC 7.6 10.6*  NEUTROABS 5.5  --   HGB 13.4 12.0*  HCT 44.2 37.5*  MCV 95.9 92.8  PLT 169 756   Basic Metabolic Panel: Recent Labs  Lab 03/03/20 1600 03/04/20 0440 03/05/20 0456  NA 133* 134* 137  K 6.1* 5.0 4.0  CL 97* 93* 97*  CO2 28 30 32  GLUCOSE 87 106* 90  BUN 25* 26* 34*  CREATININE 1.10 1.16 1.64*  CALCIUM 8.5* 8.1* 7.7*  MG  --   --  1.8    Studies: ECHOCARDIOGRAM COMPLETE  Result Date: 03/04/2020    ECHOCARDIOGRAM REPORT   Patient Name:   Jordan [name] Date of Exam: 03/04/2020 Medical Rec #:  [id number]         Height:       73.0 in Accession #:    [id number]        Weight:       225.0 lb Date of Birth:  02-06-1951         BSA:          2.262 m Patient Age:    69 years          BP:           137/47 mmHg Patient Gender: M                 HR:           50 bpm. Exam Location:  Inpatient Procedure: 2D Echo, Color Doppler, Cardiac Doppler and Intracardiac            Opacification Agent Indications:     W10.93 Acute diastolic (congestive) heart failure  History:         Patient has prior history of Echocardiogram examinations, most                  recent 03/14/2017. CHF, PAD and COPD; Risk Factors:Hypertension,                  Diabetes, Dyslipidemia, Sleep Apnea and Current Smoker.  Sonographer:     Raquel Sarna Senior RDCS Referring Phys:  [place] Diagnosing Phys: Mertie Moores MD  Sonographer Comments: Technically difficult due to COPD. IMPRESSIONS  1. Left ventricular ejection fraction, by estimation, is 55 to 60%. The left ventricle has normal function. The left ventricle has no regional wall motion abnormalities. Left ventricular diastolic function could not be evaluated.  2. Right ventricular systolic function is normal. The right ventricular size is normal.  3. Left atrial size was moderately dilated.  4. Right atrial size was mildly dilated.  5. Large pleural effusion in the left lateral  region.  6. The mitral valve is normal in structure. No evidence of mitral valve regurgitation.  7. The aortic valve is grossly normal. There is moderate calcification of the aortic valve.  There is moderate thickening of the aortic valve. Aortic valve regurgitation is mild. Moderate to severe aortic valve stenosis. FINDINGS  Left Ventricle: Left ventricular ejection fraction, by estimation, is 55 to 60%. The left ventricle has normal function. The left ventricle has no regional wall motion abnormalities. Definity contrast agent was given IV to delineate the left ventricular  endocardial borders. The left ventricular internal cavity size was normal in size. There is no left ventricular hypertrophy. Left ventricular diastolic function could not be evaluated due to atrial fibrillation. Left ventricular diastolic function could  not be evaluated. Right Ventricle: The right ventricular size is normal. No increase in right ventricular wall thickness. Right ventricular systolic function is normal. Left Atrium: Left atrial size was moderately dilated. Right Atrium: Right atrial size was mildly dilated. Pericardium: There is no evidence of pericardial effusion. Mitral Valve: The mitral valve is normal in structure. No evidence of mitral valve regurgitation. Tricuspid Valve: The tricuspid valve is grossly normal. Tricuspid valve regurgitation is not demonstrated. Aortic Valve: The aortic valve is grossly normal. There is moderate calcification of the aortic valve. There is moderate thickening of the aortic valve. There is moderate aortic valve annular calcification. Aortic valve regurgitation is mild. Aortic regurgitation PHT measures 524 msec. Moderate to severe aortic stenosis is present. Aortic valve mean gradient measures 36.5 mmHg. Aortic valve peak gradient measures 58.8 mmHg. Aortic valve area, by VTI measures 0.68 cm. Pulmonic Valve: The pulmonic valve was not well visualized. Pulmonic valve regurgitation is not  visualized. Aorta: The aortic root and ascending aorta are structurally normal, with no evidence of dilitation. IAS/Shunts: The atrial septum is grossly normal. Additional Comments: There is a large pleural effusion in the left lateral region.  LEFT VENTRICLE PLAX 2D LVIDd:         5.70 cm  Diastology LVIDs:         4.00 cm  LV e' medial:    5.00 cm/s LV PW:         1.10 cm  LV E/e' medial:  16.8 LV IVS:        0.90 cm  LV e' lateral:   7.94 cm/s LVOT diam:     2.00 cm  LV E/e' lateral: 10.6 LV SV:         70 LV SV Index:   31 LVOT Area:     3.14 cm  RIGHT VENTRICLE RV S prime:     8.92 cm/s TAPSE (M-mode): 1.8 cm LEFT ATRIUM            Index       RIGHT ATRIUM           Index LA diam:      5.20 cm  2.30 cm/m  RA Area:     20.10 cm LA Vol (A2C): 66.8 ml  29.53 ml/m RA Volume:   64.90 ml  28.69 ml/m LA Vol (A4C): 103.0 ml 45.53 ml/m  AORTIC VALVE AV Area (Vmax):    0.66 cm AV Area (Vmean):   0.61 cm AV Area (VTI):     0.68 cm AV Vmax:           383.50 cm/s AV Vmean:          285.000 cm/s AV VTI:            1.030 m AV Peak Grad:      58.8 mmHg AV Mean Grad:      36.5 mmHg LVOT Vmax:  80.23 cm/s LVOT Vmean:        55.267 cm/s LVOT VTI:          0.224 m LVOT/AV VTI ratio: 0.22 AI PHT:            524 msec  AORTA Ao Root diam: 3.00 cm MITRAL VALVE MV Area (PHT): 2.65 cm    SHUNTS MV Decel Time: 286 msec    Systemic VTI:  0.22 m MV E velocity: 84.00 cm/s  Systemic Diam: 2.00 cm MV A velocity: 26.10 cm/s MV E/A ratio:  3.22 Mertie Moores MD Electronically signed by Mertie Moores MD Signature Date/Time: 03/04/2020/1:06:57 PM    Final (Updated)     Scheduled Meds: . aspirin EC  81 mg Oral Daily  . atorvastatin  80 mg Oral QPC supper  . enoxaparin (LOVENOX) injection  40 mg Subcutaneous Q24H  . folic acid  1 mg Oral Daily  . [START ON 03/06/2020] furosemide  40 mg Oral BID  . hydrocerin   Topical TID  . influenza vaccine adjuvanted  0.5 mL Intramuscular Tomorrow-1000  . nicotine  21 mg Transdermal  Daily  . pantoprazole  40 mg Oral Daily  . sodium chloride flush  3 mL Intravenous Q12H  . venlafaxine XR  150 mg Oral QPC supper   Continuous Infusions: . sodium chloride     PRN Meds: sodium chloride, acetaminophen, butalbital-acetaminophen-caffeine, ondansetron (ZOFRAN) IV, sodium chloride flush  Time spent: 35 minutes  Author: Berle Mull, MD [facility] 03/05/2020 8:36 AM  To reach On-call, see care teams to locate the attending and reach out via [email or web address]. Between 7PM-7AM, please contact night-coverage If you still have difficulty reaching the attending provider, please page the [facility] (Director on Call) for [facility] on amion for assistance.

## 2020-03-05 NOTE — Evaluation (Signed)
Occupational Therapy Evaluation Patient Details Name: Jordan [name] MRN: [id number] DOB: Nov 06, 1950 Today's Date: 03/05/2020    History of Present Illness Pt is a 69 y/o male admited secondary to increased weakness and SOB. Thought to be from acute CHF. PMH includes COPD, DM, HTN, and tobacco use.    Clinical Impression   Pt. Ed on use of AE for LE ADLs and will need further instruction. Pt. Ed on increasing safety with transfers with cues for proper hand placement. Pt. Would benefit from further OT to maxmize I and safety at home. Acute OT to follow.   Follow Up Recommendations  Home health OT    Equipment Recommendations   (Pt thinks he as a 3-1 commode at home.)    Recommendations for Other Services       Precautions / Restrictions Precautions Precautions: Fall Restrictions Weight Bearing Restrictions: No      Mobility Bed Mobility Overal bed mobility: Needs Assistance Bed Mobility: Supine to Sit;Sit to Supine     Supine to sit: Supervision Sit to supine: Supervision   General bed mobility comments: cues for proper hand placement  Transfers   Equipment used: Rolling walker (2 wheeled)   Sit to Stand: Min assist         General transfer comment: cues for proper hand placement    Balance Overall balance assessment: Needs assistance Sitting-balance support: No upper extremity supported;Feet supported Sitting balance-Leahy Scale: Good       Standing balance-Leahy Scale: Fair                             ADL either performed or assessed with clinical judgement   ADL Overall ADL's : Needs assistance/impaired Eating/Feeding: Independent   Grooming: Supervision/safety;Standing   Upper Body Bathing: Supervision/ safety;Sitting   Lower Body Bathing: Minimal assistance;Sit to/from stand   Upper Body Dressing : Minimal assistance;Sitting   Lower Body Dressing: Moderate assistance;With adaptive equipment;Sit to/from stand   Toilet  Transfer: Minimal assistance   Toileting- Clothing Manipulation and Hygiene: Minimal assistance       Functional mobility during ADLs: Minimal assistance;Rolling walker General ADL Comments: Pt. ed on use of  AE for LE ADLs .     Vision Baseline Vision/History: Wears glasses Wears Glasses: At all times Patient Visual Report: No change from baseline Vision Assessment?: No apparent visual deficits     Perception     Praxis      Pertinent Vitals/Pain Pain Assessment: No/denies pain     Hand Dominance     Extremity/Trunk Assessment Upper Extremity Assessment Upper Extremity Assessment: RUE deficits/detail RUE Deficits / Details: decreased shld rom from  fall in febuary RUE:  (R shld flex 20 and abd  45)   Lower Extremity Assessment Lower Extremity Assessment: Defer to PT evaluation   Cervical / Trunk Assessment Cervical / Trunk Assessment: Kyphotic   Communication Communication Communication: No difficulties   Cognition Arousal/Alertness: Awake/alert Behavior During Therapy: WFL for tasks assessed/performed Overall Cognitive Status: Within Functional Limits for tasks assessed                                     General Comments       Exercises     Shoulder Instructions      Home Living Family/patient expects to be discharged to:: Private residence Living Arrangements: Alone Available Help at Discharge: Family;Neighbor;Friend(s) Type  of Home: House Home Access: Stairs to enter [employer] of Steps: 8 Entrance Stairs-Rails: Right Home Layout: One level     Bathroom Shower/Tub: Occupational psychologist: Standard     Home Equipment: Shower seat - built in;Cane - single point;Walker - 2 wheels          Prior Functioning/Environment Level of Independence: Independent with assistive device(s)        Comments: uses cane for ambulation, still drives.         OT Problem List: Decreased strength;Decreased activity  tolerance;Decreased knowledge of use of DME or AE      OT Treatment/Interventions: Self-care/ADL training;DME and/or AE instruction;Therapeutic activities;Patient/family education    OT Goals(Current goals can be found in the care plan section) Acute Rehab OT Goals Patient Stated Goal: to go home OT Goal Formulation: With patient Time For Goal Achievement: 03/19/20 Potential to Achieve Goals: Good ADL Goals Pt Will Perform Grooming: with modified independence;standing Pt Will Perform Upper Body Bathing: with modified independence;sitting Pt Will Perform Lower Body Bathing: with modified independence;sit to/from stand Pt Will Perform Upper Body Dressing: with modified independence;sitting Pt Will Perform Lower Body Dressing: with modified independence;sit to/from stand Pt Will Transfer to Toilet: with modified independence;ambulating Pt Will Perform Toileting - Clothing Manipulation and hygiene: with modified independence;sit to/from stand Pt Will Perform Tub/Shower Transfer: with modified independence;rolling walker;ambulating;shower seat  OT Frequency: Min 2X/week   Barriers to D/C: Decreased caregiver support          Co-evaluation              AM-PAC OT "6 Clicks" Daily Activity     Outcome Measure Help from another person eating meals?: None Help from another person taking care of personal grooming?: A Little Help from another person toileting, which includes using toliet, bedpan, or urinal?: A Little Help from another person bathing (including washing, rinsing, drying)?: A Little Help from another person to put on and taking off regular upper body clothing?: A Little Help from another person to put on and taking off regular lower body clothing?: A Lot 6 Click Score: 18   End of Session Equipment Utilized During Treatment: Gait belt;Rolling walker;Oxygen Nurse Communication: Mobility status  Activity Tolerance: Patient tolerated treatment well Patient left: in  chair;with call bell/phone within reach;with chair alarm set  OT Visit Diagnosis: Unsteadiness on feet (R26.81)                Time: 1010-1058 OT Time Calculation (min): 48 min Charges:  OT General Charges $OT Visit: 1 Visit OT Evaluation $OT Eval Low Complexity: 1 Low OT Treatments $Self Care/Home Management : 23-37 mins Jordan [name] OT/L  Jordan [name] 03/05/2020, 12:11 PM

## 2020-03-05 NOTE — Progress Notes (Addendum)
Initial Nutrition Assessment  DOCUMENTATION CODES:   Severe malnutrition in context of chronic illness  INTERVENTION:    Ensure Enlive po BID, each supplement provides 350 kcal and 20 grams of protein  MVI with minerals daily.  Heart healthy carbohydrate modified diet education provided.   NUTRITION DIAGNOSIS:   Severe Malnutrition related to chronic illness (COPD, CHF) as evidenced by severe muscle depletion, severe fat depletion.  GOAL:   Patient will meet greater than or equal to 90% of their needs  MONITOR:   PO intake, Supplement acceptance, Labs  REASON FOR ASSESSMENT:   Consult Assessment of nutrition requirement/status  ASSESSMENT:   69 yo male admitted with acute on chronic CHF in the setting of aortic stenosis. PMH includes COPD, DM-2, GERD, HLD, HTN, chronic venous insufficiency.   Patient reports that he has lost weight since admission r/t diuresis. Over the past few years, he has lost 130 lbs intentionally. He typically eats one big meal per day (supper) and he snacks throughout the day. Discussed the importance of eating 3 meals per day to divide CHO intake. He says this is something he has been told before and is willing to try to change his eating habits when he returns home. RD provided "Heart Healthy Carbohydrate Consistent Nutrition Therapy" handout from the Academy of [employer]etics. He was very appreciative of RD visit and information provided.   Although patient reports that his weight loss has been intentional, nutrition focused physical exam revealed moderate-severe muscle and subcutaneous fat depletion. Patient meets criteria for severe malnutrition. He is willing to try Ensure supplements to maximize protein intake. He does not want to try Prosource Plus protein supplement. Patient is eating well, reports 100% intake of meals.  Labs reviewed. BUN 34, Creatinine 1.64 CBG: 89-90-170  Medications reviewed and include folic acid, lasix,  protonix.  Patient has diuresed 6 L since admission. Lasix on hold today due to increase in creatinine.   Plans for L and R heart cath tomorrow morning.    NUTRITION - FOCUSED PHYSICAL EXAM:    Most Recent Value  Orbital Region Severe depletion  Upper Arm Region Severe depletion  Thoracic and Lumbar Region Moderate depletion  Buccal Region Moderate depletion  Temple Region Severe depletion  Clavicle Bone Region Severe depletion  Clavicle and Acromion Bone Region Severe depletion  Scapular Bone Region Severe depletion  Dorsal Hand Moderate depletion  Patellar Region Mild depletion  Anterior Thigh Region No depletion  Posterior Calf Region Unable to assess  Edema (RD Assessment) Severe  [per RN documentation, +3 deep pitting edema to BLE]  Hair Reviewed  Eyes Reviewed  Mouth Reviewed  Skin Reviewed  Nails Reviewed       Diet Order:   Diet Order            Diet Heart Room service appropriate? Yes; Fluid consistency: Thin  Diet effective now                 EDUCATION NEEDS:   Education needs have been addressed  Skin:  Skin Assessment: Reviewed RN Assessment  Last BM:  9/13  Height:   Ht Readings from Last 1 Encounters:  03/04/20 6\' 1"  (1.854 m)    Weight:   Wt Readings from Last 1 Encounters:  03/05/20 94.6 kg    Ideal Body Weight:  83.6 kg  BMI:  Body mass index is 27.52 kg/m.  Estimated Nutritional Needs:   Kcal:  2200-2400  Protein:  120-135 gm  Fluid:  2-2.2  Cherene Julian, RD, LDN, CNSC Please refer to Amion for contact information.

## 2020-03-05 NOTE — Progress Notes (Signed)
ReDS Clip Diuretic Study Pt study # [id number]  Your patient has been enrolled in the ReDS Clip Diuretic Study   Changes to prescribed diuretics recommended:  Hold diuretics today with increased SCr today Provider contacted: Dr. Posey Pronto    REDS Clip  READING= 39%  CHEST RULER = [place] = D   Orthodema score = 1 Signs/Symptoms Score   Mild edema, no orthopnea 0 No congestion  Moderate edema, no orthopnea 1 Low-grade orthodema/congestion  Severe edema OR orthopnea 2   Moderate edema and orthopnea 3 High-grade orthodema/congestion  Severe edema AND orthopnea 4    Kerby Nora, PharmD, BCPS Phone [phone] 03/05/2020       10:01 AM  Please check [email or web address] for unit-specific pharmacist phone numbers

## 2020-03-06 ENCOUNTER — Encounter (HOSPITAL_COMMUNITY)
Admission: EM | Disposition: A | Discharge status: Home Health | Payer: Self-pay | Source: Home / Self Care | Attending: Family Medicine

## 2020-03-06 ENCOUNTER — Ambulatory Visit (HOSPITAL_COMMUNITY): Admission: RE | Admit: 2020-03-06 | Payer: Medicare PPO | Source: Ambulatory Visit | Admitting: Cardiovascular Disease

## 2020-03-06 ENCOUNTER — Inpatient Hospital Stay (HOSPITAL_COMMUNITY): Payer: Medicare PPO

## 2020-03-06 ENCOUNTER — Encounter (HOSPITAL_COMMUNITY): Payer: Self-pay | Admitting: Cardiovascular Disease

## 2020-03-06 DIAGNOSIS — I2510 Atherosclerotic heart disease of native coronary artery without angina pectoris: Secondary | ICD-10-CM

## 2020-03-06 DIAGNOSIS — I5023 Acute on chronic systolic (congestive) heart failure: Secondary | ICD-10-CM

## 2020-03-06 HISTORY — PX: RIGHT/LEFT HEART CATH AND CORONARY ANGIOGRAPHY: CATH118266

## 2020-03-06 LAB — BASIC METABOLIC PANEL
Anion gap: 8 (ref 5–15)
BUN: 32 mg/dL — ABNORMAL HIGH (ref 8–23)
CO2: 33 mmol/L — ABNORMAL HIGH (ref 22–32)
Calcium: 7.8 mg/dL — ABNORMAL LOW (ref 8.9–10.3)
Chloride: 96 mmol/L — ABNORMAL LOW (ref 98–111)
Creatinine, Ser: 1.38 mg/dL — ABNORMAL HIGH (ref 0.61–1.24)
GFR calc Af Amer: >60 mL/min (ref >60)
GFR calc non Af Amer: 52 mL/min — ABNORMAL LOW (ref >60)
Glucose, Bld: 77 mg/dL (ref 70–99)
Potassium: 4.5 mmol/L (ref 3.5–5.1)
Sodium: 137 mmol/L (ref 135–145)

## 2020-03-06 LAB — POCT I-STAT EG7
Acid-Base Excess: 6 mmol/L — ABNORMAL HIGH (ref 0.0–2.0)
Acid-Base Excess: 8 mmol/L — ABNORMAL HIGH (ref 0.0–2.0)
Bicarbonate: 33.9 mmol/L — ABNORMAL HIGH (ref 20.0–28.0)
Bicarbonate: 35.5 mmol/L — ABNORMAL HIGH (ref 20.0–28.0)
Calcium, Ion: 0.94 mmol/L — ABNORMAL LOW (ref 1.15–1.40)
Calcium, Ion: 1.1 mmol/L — ABNORMAL LOW (ref 1.15–1.40)
HCT: 34 % — ABNORMAL LOW (ref 39.0–52.0)
HCT: 38 % — ABNORMAL LOW (ref 39.0–52.0)
Hemoglobin: 11.6 g/dL — ABNORMAL LOW (ref 13.0–17.0)
Hemoglobin: 12.9 g/dL — ABNORMAL LOW (ref 13.0–17.0)
O2 Saturation: 71 %
O2 Saturation: 94 %
Potassium: 3.6 mmol/L (ref 3.5–5.1)
Potassium: 3.9 mmol/L (ref 3.5–5.1)
Sodium: 141 mmol/L (ref 135–145)
Sodium: 142 mmol/L (ref 135–145)
TCO2: 36 mmol/L — ABNORMAL HIGH (ref 22–32)
TCO2: 37 mmol/L — ABNORMAL HIGH (ref 22–32)
pCO2, Ven: 64.3 mmHg — ABNORMAL HIGH (ref 44.0–60.0)
pCO2, Ven: 64.4 mmHg — ABNORMAL HIGH (ref 44.0–60.0)
pH, Ven: 7.329 (ref 7.250–7.430)
pH, Ven: 7.35 (ref 7.250–7.430)
pO2, Ven: 42 mmHg (ref 32.0–45.0)
pO2, Ven: 78 mmHg — ABNORMAL HIGH (ref 32.0–45.0)

## 2020-03-06 LAB — GLUCOSE, CAPILLARY
Glucose-Capillary: 81 mg/dL (ref 70–99)
Glucose-Capillary: 83 mg/dL (ref 70–99)
Glucose-Capillary: 91 mg/dL (ref 70–99)
Glucose-Capillary: 93 mg/dL (ref 70–99)
Glucose-Capillary: 134 mg/dL — ABNORMAL HIGH (ref 70–99)

## 2020-03-06 LAB — CBC
HCT: 38.6 % — ABNORMAL LOW (ref 39.0–52.0)
Hemoglobin: 12.1 g/dL — ABNORMAL LOW (ref 13.0–17.0)
MCH: 29.2 pg (ref 26.0–34.0)
MCHC: 31.3 g/dL (ref 30.0–36.0)
MCV: 93 fL (ref 80.0–100.0)
Platelets: 159 10*3/uL (ref 150–400)
RBC: 4.15 MIL/uL — ABNORMAL LOW (ref 4.22–5.81)
RDW: 15.1 % (ref 11.5–15.5)
WBC: 7.4 10*3/uL (ref 4.0–10.5)
nRBC: 0 % (ref 0.0–0.2)

## 2020-03-06 LAB — LIPID PANEL
Cholesterol: 108 mg/dL (ref 0–200)
HDL: 30 mg/dL — ABNORMAL LOW (ref >40)
LDL Cholesterol: 64 mg/dL (ref 0–99)
Total CHOL/HDL Ratio: 3.6 RATIO
Triglycerides: 68 mg/dL (ref <150)
VLDL: 14 mg/dL (ref 0–40)

## 2020-03-06 SURGERY — RIGHT/LEFT HEART CATH AND CORONARY ANGIOGRAPHY
Anesthesia: LOCAL

## 2020-03-06 MED ORDER — FENTANYL CITRATE (PF) 100 MCG/2ML IJ SOLN
INTRAMUSCULAR | Status: AC
  Filled 2020-03-06: qty 2

## 2020-03-06 MED ORDER — VERAPAMIL HCL 2.5 MG/ML IV SOLN
INTRAVENOUS | Status: DC | PRN
  Administered 2020-03-06: 10 mL via INTRA_ARTERIAL

## 2020-03-06 MED ORDER — VERAPAMIL HCL 2.5 MG/ML IV SOLN
INTRAVENOUS | Status: AC
  Filled 2020-03-06: qty 2

## 2020-03-06 MED ORDER — SODIUM CHLORIDE 0.9 % IV SOLN: 250.0000 mL | INTRAVENOUS | Status: DC | PRN

## 2020-03-06 MED ORDER — HYDRALAZINE HCL 20 MG/ML IJ SOLN: 10.0000 mg | INTRAMUSCULAR | Status: AC | PRN

## 2020-03-06 MED ORDER — MIDAZOLAM HCL 2 MG/2ML IJ SOLN
INTRAMUSCULAR | Status: DC | PRN
  Administered 2020-03-06: 1 mg via INTRAVENOUS

## 2020-03-06 MED ORDER — HEPARIN SODIUM (PORCINE) 1000 UNIT/ML IJ SOLN
INTRAMUSCULAR | Status: DC | PRN
  Administered 2020-03-06: 5000 [IU] via INTRAVENOUS

## 2020-03-06 MED ORDER — LIDOCAINE HCL (PF) 1 % IJ SOLN
INTRAMUSCULAR | Status: AC
  Filled 2020-03-06: qty 30

## 2020-03-06 MED ORDER — HEPARIN SODIUM (PORCINE) 1000 UNIT/ML IJ SOLN
INTRAMUSCULAR | Status: AC
  Filled 2020-03-06: qty 1

## 2020-03-06 MED ORDER — SODIUM CHLORIDE 0.9% FLUSH
3.0000 mL | Freq: Two times a day (BID) | INTRAVENOUS | Status: DC
  Administered 2020-03-06 – 2020-03-11 (×7): 3 mL via INTRAVENOUS

## 2020-03-06 MED ORDER — SODIUM CHLORIDE 0.9% FLUSH: 3.0000 mL | INTRAVENOUS | Status: DC | PRN

## 2020-03-06 MED ORDER — SODIUM CHLORIDE 0.9 % IV SOLN: INTRAVENOUS | Status: AC

## 2020-03-06 MED ORDER — MIDAZOLAM HCL 2 MG/2ML IJ SOLN
INTRAMUSCULAR | Status: AC
  Filled 2020-03-06: qty 2

## 2020-03-06 MED ORDER — LABETALOL HCL 5 MG/ML IV SOLN: 10.0000 mg | INTRAVENOUS | Status: AC | PRN

## 2020-03-06 MED ORDER — LIDOCAINE HCL (PF) 1 % IJ SOLN
INTRAMUSCULAR | Status: DC | PRN
  Administered 2020-03-06: 5 mL

## 2020-03-06 MED ORDER — FENTANYL CITRATE (PF) 100 MCG/2ML IJ SOLN
INTRAMUSCULAR | Status: DC | PRN
Start: 2020-03-06 — End: 2020-03-06
  Administered 2020-03-06: 25 ug via INTRAVENOUS

## 2020-03-06 MED ORDER — HEPARIN (PORCINE) IN NACL 1000-0.9 UT/500ML-% IV SOLN
INTRAVENOUS | Status: AC
  Filled 2020-03-06: qty 1000

## 2020-03-06 MED ORDER — IOHEXOL 350 MG/ML SOLN
INTRAVENOUS | Status: DC | PRN
  Administered 2020-03-06: 30 mL

## 2020-03-06 MED ORDER — HEPARIN (PORCINE) IN NACL 1000-0.9 UT/500ML-% IV SOLN
INTRAVENOUS | Status: DC | PRN
  Administered 2020-03-06 (×2): 500 mL

## 2020-03-06 SURGICAL SUPPLY — 10 items
CATH 5FR JL3.5 JR4 ANG PIG MP (CATHETERS) ×2 IMPLANT
CATH SWAN DBL LUMAN 5F 110 (CATHETERS) ×2 IMPLANT
DEVICE RAD COMP TR BAND LRG (VASCULAR PRODUCTS) ×2 IMPLANT
GLIDESHEATH SLEND SS 6F .021 (SHEATH) ×4 IMPLANT
GUIDEWIRE INQWIRE 1.5J.035X260 (WIRE) ×1 IMPLANT
INQWIRE 1.5J .035X260CM (WIRE) ×2
KIT HEART LEFT (KITS) ×2 IMPLANT
PACK CARDIAC CATHETERIZATION (CUSTOM PROCEDURE TRAY) ×2 IMPLANT
TRANSDUCER W/STOPCOCK (MISCELLANEOUS) ×2 IMPLANT
TUBING CIL FLEX 10 FLL-RA (TUBING) ×2 IMPLANT

## 2020-03-06 NOTE — Progress Notes (Addendum)
Progress Note  Patient Name: Jordan [name] Date of Encounter: 03/06/2020  [facility] Cardiologist: Candee Furbish, MD   Subjective   Breathing continues to improve. No chest pain or palpitations.   Inpatient Medications    Scheduled Meds: . aspirin EC  81 mg Oral Daily  . atorvastatin  80 mg Oral QPC supper  . enoxaparin (LOVENOX) injection  40 mg Subcutaneous Q24H  . feeding supplement (ENSURE ENLIVE)  237 mL Oral BID BM  . folic acid  1 mg Oral Daily  . hydrocerin   Topical TID  . influenza vaccine adjuvanted  0.5 mL Intramuscular Tomorrow-1000  . multivitamin with minerals  1 tablet Oral Daily  . nicotine  21 mg Transdermal Daily  . pantoprazole  40 mg Oral Daily  . sodium chloride flush  3 mL Intravenous Q12H  . venlafaxine XR  150 mg Oral QPC supper   Continuous Infusions: . sodium chloride    . sodium chloride 1 mL/kg/hr (03/06/20 0529)   PRN Meds: sodium chloride, acetaminophen, butalbital-acetaminophen-caffeine, LORazepam, ondansetron (ZOFRAN) IV, sodium chloride flush   Vital Signs    Vitals:   03/05/20 1400 03/05/20 2024 03/06/20 0425 03/06/20 0500  BP: 122/63 (!) 130/37 (!) 123/38   Pulse: 62 (!) 59 (!) 46   Resp: 18 18 20    Temp: 97.8 F (36.6 C) (!) 97.5 F (36.4 C) 98 F (36.7 C)   TempSrc: Oral Oral Oral   SpO2: 96% 94% 96%   Weight:   84.5 kg 84.8 kg  Height:        Intake/Output Summary (Last 24 hours) at 03/06/2020 0832 Last data filed at 03/06/2020 0734 Gross per 24 hour  Intake 360 ml  Output 1000 ml  Net -640 ml   Last 3 Weights 03/06/2020 03/06/2020 03/05/2020  Weight (lbs) 186 lb 14.4 oz 186 lb 4.8 oz 208 lb 8.9 oz  Weight (kg) 84.777 kg 84.505 kg 94.6 kg      Telemetry    Sinus bradycardia in 40-50s- Personally Reviewed  ECG    N/a  Physical Exam   GEN: No acute distress.   Neck: No JVD Cardiac: RRR, 3/6 SE murmurs, rubs, or gallops.  Respiratory: Clear to auscultation bilaterally. GI: Soft, nontender,  non-distended  MS: 1+ edema with ACE wrap; No deformity. Neuro:  Nonfocal  Psych: Normal affect   Labs    High Sensitivity Troponin:  No results for input(s): TROPONINIHS in the last 720 hours.    Chemistry Recent Labs  Lab 03/03/20 1600 03/03/20 1600 03/04/20 0440 03/05/20 0456 03/05/20 1225  NA 133*   < > 134* 137 137  K 6.1*   < > 5.0 4.0 4.6  CL 97*   < > 93* 97* 95*  CO2 28   < > 30 32 30  GLUCOSE 87   < > 106* 90 73  BUN 25*   < > 26* 34* 35*  CREATININE 1.10   < > 1.16 1.64* 1.57*  CALCIUM 8.5*   < > 8.1* 7.7* 7.8*  PROT 5.6*  --   --   --   --   ALBUMIN 3.1*  --   --   --   --   AST 25  --   --   --   --   ALT 13  --   --   --   --   ALKPHOS 151*  --   --   --   --   BILITOT 1.3*  --   --   --   --  GFRNONAA >60   < > >60 42* 44*  GFRAA >60   < > >60 49* 51*  ANIONGAP 8   < > 11 8 12    < > = values in this interval not displayed.     Hematology Recent Labs  Lab 03/03/20 1600 03/05/20 0456 03/06/20 0508  WBC 7.6 10.6* 7.4  RBC 4.61 4.04* 4.15*  HGB 13.4 12.0* 12.1*  HCT 44.2 37.5* 38.6*  MCV 95.9 92.8 93.0  MCH 29.1 29.7 29.2  MCHC 30.3 32.0 31.3  RDW 14.6 14.8 15.1  PLT 169 189 159    BNP Recent Labs  Lab 03/03/20 1600  BNP 2,454.0*     Radiology    DG Chest 2 View  Result Date: 03/05/2020 CLINICAL DATA:  Recent shortness of breath EXAM: CHEST - 2 VIEW COMPARISON:  March 03, 2020. FINDINGS: There is cardiomegaly with a degree of pulmonary venous hypertension. There are pleural effusions bilaterally. There is slight interstitial edema. There is atelectatic change in the lung bases. No adenopathy. There is aortic atherosclerosis. Degenerative change noted in each shoulder. IMPRESSION: Cardiomegaly with pulmonary vascular congestion. Pleural effusions and mild edema. Appearance is felt to be indicative of a degree of congestive heart failure. There is bibasilar atelectasis. No frank consolidation. Aortic Atherosclerosis (ICD10-I70.0).  Electronically Signed   By: Lowella Grip III M.D.   On: 03/05/2020 15:17   ECHOCARDIOGRAM COMPLETE  Result Date: 03/04/2020    ECHOCARDIOGRAM REPORT   Patient Name:   Jordan [name] Date of Exam: 03/04/2020 Medical Rec #:  [id number]         Height:       73.0 in Accession #:    [id number]        Weight:       225.0 lb Date of Birth:  1951/05/13         BSA:          2.262 m Patient Age:    69 years          BP:           137/47 mmHg Patient Gender: M                 HR:           50 bpm. Exam Location:  Inpatient Procedure: 2D Echo, Color Doppler, Cardiac Doppler and Intracardiac            Opacification Agent Indications:     Q65.78 Acute diastolic (congestive) heart failure  History:         Patient has prior history of Echocardiogram examinations, most                  recent 03/14/2017. CHF, PAD and COPD; Risk Factors:Hypertension,                  Diabetes, Dyslipidemia, Sleep Apnea and Current Smoker.  Sonographer:     Raquel Sarna Senior RDCS Referring Phys:  [place] Diagnosing Phys: Mertie Moores MD  Sonographer Comments: Technically difficult due to COPD. IMPRESSIONS  1. Left ventricular ejection fraction, by estimation, is 55 to 60%. The left ventricle has normal function. The left ventricle has no regional wall motion abnormalities. Left ventricular diastolic function could not be evaluated.  2. Right ventricular systolic function is normal. The right ventricular size is normal.  3. Left atrial size was moderately dilated.  4. Right atrial size was mildly dilated.  5. Large pleural effusion in the left  lateral region.  6. The mitral valve is normal in structure. No evidence of mitral valve regurgitation.  7. The aortic valve is grossly normal. There is moderate calcification of the aortic valve. There is moderate thickening of the aortic valve. Aortic valve regurgitation is mild. Moderate to severe aortic valve stenosis. FINDINGS  Left Ventricle: Left ventricular ejection fraction, by  estimation, is 55 to 60%. The left ventricle has normal function. The left ventricle has no regional wall motion abnormalities. Definity contrast agent was given IV to delineate the left ventricular  endocardial borders. The left ventricular internal cavity size was normal in size. There is no left ventricular hypertrophy. Left ventricular diastolic function could not be evaluated due to atrial fibrillation. Left ventricular diastolic function could  not be evaluated. Right Ventricle: The right ventricular size is normal. No increase in right ventricular wall thickness. Right ventricular systolic function is normal. Left Atrium: Left atrial size was moderately dilated. Right Atrium: Right atrial size was mildly dilated. Pericardium: There is no evidence of pericardial effusion. Mitral Valve: The mitral valve is normal in structure. No evidence of mitral valve regurgitation. Tricuspid Valve: The tricuspid valve is grossly normal. Tricuspid valve regurgitation is not demonstrated. Aortic Valve: The aortic valve is grossly normal. There is moderate calcification of the aortic valve. There is moderate thickening of the aortic valve. There is moderate aortic valve annular calcification. Aortic valve regurgitation is mild. Aortic regurgitation PHT measures 524 msec. Moderate to severe aortic stenosis is present. Aortic valve mean gradient measures 36.5 mmHg. Aortic valve peak gradient measures 58.8 mmHg. Aortic valve area, by VTI measures 0.68 cm. Pulmonic Valve: The pulmonic valve was not well visualized. Pulmonic valve regurgitation is not visualized. Aorta: The aortic root and ascending aorta are structurally normal, with no evidence of dilitation. IAS/Shunts: The atrial septum is grossly normal. Additional Comments: There is a large pleural effusion in the left lateral region.  LEFT VENTRICLE PLAX 2D LVIDd:         5.70 cm  Diastology LVIDs:         4.00 cm  LV e' medial:    5.00 cm/s LV PW:         1.10 cm  LV E/e'  medial:  16.8 LV IVS:        0.90 cm  LV e' lateral:   7.94 cm/s LVOT diam:     2.00 cm  LV E/e' lateral: 10.6 LV SV:         70 LV SV Index:   31 LVOT Area:     3.14 cm  RIGHT VENTRICLE RV S prime:     8.92 cm/s TAPSE (M-mode): 1.8 cm LEFT ATRIUM            Index       RIGHT ATRIUM           Index LA diam:      5.20 cm  2.30 cm/m  RA Area:     20.10 cm LA Vol (A2C): 66.8 ml  29.53 ml/m RA Volume:   64.90 ml  28.69 ml/m LA Vol (A4C): 103.0 ml 45.53 ml/m  AORTIC VALVE AV Area (Vmax):    0.66 cm AV Area (Vmean):   0.61 cm AV Area (VTI):     0.68 cm AV Vmax:           383.50 cm/s AV Vmean:          285.000 cm/s AV VTI:  1.030 m AV Peak Grad:      58.8 mmHg AV Mean Grad:      36.5 mmHg LVOT Vmax:         80.23 cm/s LVOT Vmean:        55.267 cm/s LVOT VTI:          0.224 m LVOT/AV VTI ratio: 0.22 AI PHT:            524 msec  AORTA Ao Root diam: 3.00 cm MITRAL VALVE MV Area (PHT): 2.65 cm    SHUNTS MV Decel Time: 286 msec    Systemic VTI:  0.22 m MV E velocity: 84.00 cm/s  Systemic Diam: 2.00 cm MV A velocity: 26.10 cm/s MV E/A ratio:  3.22 Mertie Moores MD Electronically signed by Mertie Moores MD Signature Date/Time: 03/04/2020/1:06:57 PM    Final (Updated)     Cardiac Studies   Echo 03/04/20 1. Left ventricular ejection fraction, by estimation, is 55 to 60%. The  left ventricle has normal function. The left ventricle has no regional  wall motion abnormalities. Left ventricular diastolic function could not  be evaluated.  2. Right ventricular systolic function is normal. The right ventricular  size is normal.  3. Left atrial size was moderately dilated.  4. Right atrial size was mildly dilated.  5. Large pleural effusion in the left lateral region.  6. The mitral valve is normal in structure. No evidence of mitral valve  regurgitation.  7. The aortic valve is grossly normal. There is moderate calcification of  the aortic valve. There is moderate thickening of the aortic valve.  Aortic  valve regurgitation is mild. Moderate to severe aortic valve stenosis.   Patient Profile     Jordan [name] is a 69 y.o. male with a hx of hypertension, hyperlipidemia, diabetes mellitus, COPD with ongoing tobacco smoking and sleep apnea who is being seen for the evaluation of CHF and aortic stenosis at the request of Dr. Posey Pronto.  Patient reported greater than 34-months history of dyspnea on exertion along with orthopnea, PND, and lower extremity edema.  Recently worsened in past few weeks. No exertional chest tightness.   Assessment & Plan    1. Acute on chronic diastolic heart failure in setting of aortic stenosis - He diuresed 6.3L. weight 225>>186lb. Due to bump in renal function yesterday to 1.64 from 1.1 his lasix was held. SCr improved to 1.57 today. Getting fluids for cath.  - Further diuresis pending evaluation by cath   2. Moderate to severe aortic stenosis - Symptomatic.  - Aortic valve mean gradient measures 36.5 mmHg. Aortic valve peak  gradient measures 58.8 mmHg. Aortic valve area, by VTI measures 0.68 cm. - R  & L cath today with suspected underlying CAD  3. Sinus bradycardia - Last dose of atenolol 25mg  was 9/15.  No pathologic brady arrhythmias.   4. HTN - BP stable - BB on hold as above  5. COPD - No active wheezing  6. Tobacco smoking - Cessation recommended  7. HLD - Pending lipid profile  - Continue Lipitor 80mg  qd    For questions or updates, please contact [facility] Please consult [email or web address] for contact info under        SignedLeanor Kail, PA  03/06/2020, 8:32 AM    Personally seen and examined. Agree with above.   Going for cardiac catheterization.  Right and left.  Assess fluid status.  Assess aortic stenosis.  Creatinine improved today.  Hydrating prior to cath.  Creatinine did bump with prior diuresis.  COPD noted.  Tobacco cessation.  Could have underlying triple-vessel coronary artery disease which  may put him for candidacy for aortic valve replacement and CABG.  We shall see.  Bradycardia noted.  We will go ahead and hold his beta-blocker atenolol.  Candee Furbish, MD

## 2020-03-06 NOTE — Interval H&P Note (Signed)
History and Physical Interval Note:  03/06/2020 12:09 PM  Jordan [name]  has presented today for surgery, with the diagnosis of aotric stenosis.  The various methods of treatment have been discussed with the patient and family. After consideration of risks, benefits and other options for treatment, the patient has consented to  Procedure(s): RIGHT/LEFT HEART CATH AND CORONARY ANGIOGRAPHY (N/A) as a surgical intervention.  The patient's history has been reviewed, patient examined, no change in status, stable for surgery.  I have reviewed the patient's chart and labs.  Questions were answered to the patient's satisfaction.     Sherren Mocha

## 2020-03-06 NOTE — Progress Notes (Signed)
PT Cancellation Note  Patient Details Name: Jordan [name] MRN: [id number] DOB: 12/29/1950   Cancelled Treatment:    Reason Eval/Treat Not Completed: (P) Patient at procedure or test/unavailable Pt off floor in Cath Lab. Pt will follow back for treatment as able this afternoon.   Usiel Astarita B. Migdalia Dk PT, DPT Acute Rehabilitation Services Pager [phone] Office [phone]    [place] 03/06/2020, 12:58 PM

## 2020-03-06 NOTE — Research (Signed)
PHDEV Informed Consent   Subject Name: Jordan [name]  Subject met inclusion and exclusion criteria.  The informed consent form, study requirements and expectations were reviewed with the subject and questions and concerns were addressed prior to the signing of the consent form.  The subject verbalized understanding of the trial requirements.  The subject agreed to participate in the [facility] trial and signed the informed consent at [place] on 03/06/2020.  The informed consent was obtained prior to performance of any protocol-specific procedures for the subject.  A copy of the signed informed consent was given to the subject and a copy was placed in the subject's medical record.   [name]

## 2020-03-06 NOTE — Consult Note (Signed)
[facility]   MULTIDISCIPLINARY HEART VALVE TEAM  Date:  03/06/2020   ID:  Jordan [name], DOB 1950/09/06, MRN [id number]  PCP:  [name], MD   Chief Complaint  Patient presents with  . Shortness of Breath     HISTORY OF PRESENT ILLNESS: Jordan [name] is a 69 y.o. male admitted with congestive heart failure with structural heart consultation requested by Dr Marlou Porch for evaluation of aortic stenosis.  The patient is admitted March 03, 2020 with acute congestive heart failure and volume overload.  He has a past medical history of hypertension, type 2 diabetes, and COPD with ongoing tobacco use.  He is a 2 pack/day smoker.  The patient was initially noted to be hypoxemic.  He was treated with IV furosemide and diuresed well with a decrease in his weight from 225 pounds on admission to 187 pounds today.  The patient had an echocardiogram that demonstrated normal LV systolic function with moderately severe aortic stenosis.  He was referred for right and left heart catheterization and structural heart consultation has been requested for consideration of treatment options for aortic stenosis.  The patient is not married.  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  He has been a longstanding 2 pack/day smoker.  He does not drink alcohol.  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  He describes about 4 to 6 weeks of progressive shortness of breath with low-level activity.  He denies clear-cut orthopnea but states that he sleeps in a recliner.  No PND.  He has noticed increasing swelling in his legs.  He has not had any chest pain or pressure, lightheadedness, or syncope.  The patient has not had regular dental care and reports poor dentition with need for extraction of his remaining teeth.  Past  Medical History:  Diagnosis Date  . Acute CHF (congestive heart failure) ([place]) 03/03/2020  . Anxiety   . Arthritis   . COPD (chronic obstructive pulmonary disease) ([place])   . Depression   . Diabetes mellitus without complication ([place])   . GERD (gastroesophageal reflux disease)   . Hyperlipidemia   . Hypertension   . Migraine   . Obesity   . Sleep apnea    CPAP   . Varicose veins     Current Facility-Administered Medications  Medication Dose Route Frequency Provider Last Rate Last Admin  . 0.9 %  sodium chloride infusion   Intravenous Continuous Sherren Mocha, MD 50 mL/hr at 03/06/20 1350 New Bag at 03/06/20 1350  . 0.9 %  sodium chloride infusion  250 mL Intravenous PRN Sherren Mocha, MD      . acetaminophen (TYLENOL) tablet 650 mg  650 mg Oral Q4H PRN Neena Rhymes, MD   650 mg at 03/05/20 2155  . aspirin EC tablet 81 mg  81 mg Oral Daily Neena Rhymes, MD   81 mg at 03/05/20 1109  . atorvastatin (LIPITOR) tablet 80 mg  80 mg Oral QPC supper Neena Rhymes, MD   80 mg at 03/05/20 1652  . butalbital-acetaminophen-caffeine (FIORICET) 50-325-40 MG per tablet 1 tablet  1 tablet Oral BID PRN Lavina Hamman, MD      . enoxaparin (LOVENOX) injection 40 mg  40 mg Subcutaneous Q24H Norins, Heinz Knuckles, MD   40 mg at 03/05/20  2155  . feeding supplement (ENSURE ENLIVE) (ENSURE ENLIVE) liquid 237 mL  237 mL Oral BID BM Lavina Hamman, MD   237 mL at 03/06/20 1353  . folic acid (FOLVITE) tablet 1 mg  1 mg Oral Daily Norins, Heinz Knuckles, MD   1 mg at 03/06/20 1352  . hydrALAZINE (APRESOLINE) injection 10 mg  10 mg Intravenous Q20 Min PRN Sherren Mocha, MD      . hydrocerin (EUCERIN) cream   Topical TID Lavina Hamman, MD   Given at 03/05/20 2156  . influenza vaccine adjuvanted (FLUAD) injection 0.5 mL  0.5 mL Intramuscular Tomorrow-1000 Lavina Hamman, MD      . labetalol (NORMODYNE) injection 10 mg  10 mg Intravenous Q10 min PRN Sherren Mocha, MD      . LORazepam (ATIVAN) tablet  0.5 mg  0.5 mg Oral Q6H PRN Lavina Hamman, MD   0.5 mg at 03/05/20 1823  . multivitamin with minerals tablet 1 tablet  1 tablet Oral Daily Lavina Hamman, MD   1 tablet at 03/06/20 1349  . nicotine (NICODERM CQ - dosed in mg/24 hours) patch 21 mg  21 mg Transdermal Daily Norins, Heinz Knuckles, MD   21 mg at 03/06/20 1351  . ondansetron (ZOFRAN) injection 4 mg  4 mg Intravenous Q6H PRN Norins, Heinz Knuckles, MD      . pantoprazole (PROTONIX) EC tablet 40 mg  40 mg Oral Daily Norins, Heinz Knuckles, MD   40 mg at 03/06/20 1349  . sodium chloride flush (NS) 0.9 % injection 3 mL  3 mL Intravenous Q12H Bhagat, Bhavinkumar, PA      . sodium chloride flush (NS) 0.9 % injection 3 mL  3 mL Intravenous Q12H Sherren Mocha, MD      . sodium chloride flush (NS) 0.9 % injection 3 mL  3 mL Intravenous PRN Sherren Mocha, MD      . venlafaxine XR (EFFEXOR-XR) 24 hr capsule 150 mg  150 mg Oral QPC supper Norins, Heinz Knuckles, MD   150 mg at 03/05/20 1652    ALLERGIES:   Altace [ramipril], Codeine, and Naproxen   SOCIAL HISTORY:  The patient  reports that he has been smoking cigarettes. He has a 40.00 pack-year smoking history. He has never used smokeless tobacco. He reports that he does not drink alcohol and does not use drugs.   FAMILY HISTORY:  The patient's family history includes Alzheimer's disease in his mother; Congestive Heart Failure in his father; Diabetes in an other family member; Heart attack in his father; Hypertension in his mother; Stroke in his father.   REVIEW OF SYSTEMS:  Positive for upper back pain, leg pain, shortness of breath, cough.   All other systems are reviewed and negative.   PHYSICAL EXAM: VS:  BP (!) 125/48   Pulse (!) 57   Temp 98 F (36.7 C) (Oral)   Resp 10   Ht 6\' 1"  (1.854 m)   Wt 84.8 kg   SpO2 96%   BMI 24.66 kg/m  , BMI Body mass index is 24.66 kg/m. GEN: Well nourished, well developed, kyphotic male in no acute distress HEENT: normal Neck: No JVD. carotids 2+ with  bilateral bruits Cardiac: The heart is RRR with a 3/6 late peaking harsh systolic murmur with diminished A2 both legs are in [place] boots.   Respiratory: Rhonchi bilaterally, diminished breath sounds in the bases GI: soft, nontender, nondistended, + BS MS: no deformity or atrophy Skin: warm and dry, no  rash Neuro:  Strength and sensation are intact Psych: euthymic mood, full affect  EKG:  EKG from March 03, 2020 reviewed and demonstrates normal sinus rhythm 59 bpm, nonspecific ST abnormality  RECENT LABS: 03/03/2020: ALT 13; B Natriuretic Peptide 2,454.0 03/05/2020: Magnesium 1.8 03/06/2020: BUN 32; Creatinine, Ser 1.38; Hemoglobin 12.1; Platelets 159; Potassium 4.5; Sodium 137  03/06/2020: Cholesterol 108; HDL 30; LDL Cholesterol 64; Total CHOL/HDL Ratio 3.6; Triglycerides 68; VLDL 14   Estimated Creatinine Clearance: 57.1 mL/min (A) (by C-G formula based on SCr of 1.38 mg/dL (H)).   Wt Readings from Last 3 Encounters:  03/06/20 84.8 kg  12/26/19 102.1 kg  11/28/19 102.1 kg     CARDIAC STUDIES:  Echo:  FINDINGS  Left Ventricle: Left ventricular ejection fraction, by estimation, is 55  to 60%. The left ventricle has normal function. The left ventricle has no  regional wall motion abnormalities. Definity contrast agent was given IV  to delineate the left ventricular  endocardial borders. The left ventricular internal cavity size was normal  in size. There is no left ventricular hypertrophy. Left ventricular  diastolic function could not be evaluated due to atrial fibrillation. Left  ventricular diastolic function could  not be evaluated.   Right Ventricle: The right ventricular size is normal. No increase in  right ventricular wall thickness. Right ventricular systolic function is  normal.   Left Atrium: Left atrial size was moderately dilated.   Right Atrium: Right atrial size was mildly dilated.   Pericardium: There is no evidence of pericardial effusion.   Mitral  Valve: The mitral valve is normal in structure. No evidence of  mitral valve regurgitation.   Tricuspid Valve: The tricuspid valve is grossly normal. Tricuspid valve  regurgitation is not demonstrated.   Aortic Valve: The aortic valve is grossly normal. There is moderate  calcification of the aortic valve. There is moderate thickening of the  aortic valve. There is moderate aortic valve annular calcification. Aortic  valve regurgitation is mild. Aortic  regurgitation PHT measures 524 msec. Moderate to severe aortic stenosis is  present. Aortic valve mean gradient measures 36.5 mmHg. Aortic valve peak  gradient measures 58.8 mmHg. Aortic valve area, by VTI measures 0.68 cm.   Pulmonic Valve: The pulmonic valve was not well visualized. Pulmonic valve  regurgitation is not visualized.   Aorta: The aortic root and ascending aorta are structurally normal, with  no evidence of dilitation.   IAS/Shunts: The atrial septum is grossly normal.   Additional Comments: There is a large pleural effusion in the left lateral  region.     LEFT VENTRICLE  PLAX 2D  LVIDd:     5.70 cm Diastology  LVIDs:     4.00 cm LV e' medial:  5.00 cm/s  LV PW:     1.10 cm LV E/e' medial: 16.8  LV IVS:    0.90 cm LV e' lateral:  7.94 cm/s  LVOT diam:   2.00 cm LV E/e' lateral: 10.6  LV SV:     70  LV SV Index:  31  LVOT Area:   3.14 cm     RIGHT VENTRICLE  RV S prime:   8.92 cm/s  TAPSE (M-mode): 1.8 cm   LEFT ATRIUM      Index    RIGHT ATRIUM      Index  LA diam:   5.20 cm 2.30 cm/m RA Area:   20.10 cm  LA Vol (A2C): 66.8 ml 29.53 ml/m RA Volume:  64.90 ml 28.69  ml/m  LA Vol (A4C): 103.0 ml 45.53 ml/m  AORTIC VALVE  AV Area (Vmax):  0.66 cm  AV Area (Vmean):  0.61 cm  AV Area (VTI):   0.68 cm  AV Vmax:      383.50 cm/s  AV Vmean:     285.000 cm/s  AV VTI:      1.030 m  AV Peak Grad:   58.8 mmHg  AV  Mean Grad:   36.5 mmHg  LVOT Vmax:     80.23 cm/s  LVOT Vmean:    55.267 cm/s  LVOT VTI:     0.224 m  LVOT/AV VTI ratio: 0.22  AI PHT:      524 msec    AORTA  Ao Root diam: 3.00 cm   MITRAL VALVE  MV Area (PHT): 2.65 cm  SHUNTS  MV Decel Time: 286 msec  Systemic VTI: 0.22 m  MV E velocity: 84.00 cm/s Systemic Diam: 2.00 cm  MV A velocity: 26.10 cm/s  MV E/A ratio: 3.22    Cardiac Cath:  Conclusion  1.  Nonobstructive coronary artery disease with mild nonobstructive plaquing in the RCA, wide patency of the left main, wide patency of the LAD, and mild to moderate mid circumflex stenosis 2.  Moderate aortic stenosis with mean gradient 26 mmHg and calculated aortic valve area 1.6 cm. 3.  Moderate pulmonary hypertension with mean PA pressure 34 mmHg, transpulmonary gradient 13 mmHg, PVR less than 2 Wood units  Coronary Findings  Diagnostic Dominance: Right Left Anterior Descending  The vessel is moderately calcified.  Left Circumflex  Prox Cx to Mid Cx lesion is 50% stenosed. The lesion is calcified.  Right Coronary Artery  The vessel is mildly calcified.  Mid RCA lesion is 30% stenosed.  Dist RCA lesion is 30% stenosed.  Intervention  No interventions have been documented. Left Heart  Aortic Valve There is moderate aortic valve stenosis. The aortic valve is calcified. There is restricted aortic valve motion. The aortic valve mean gradient is 26 mmHg, peak instantaneous gradient 42 mmHg, calculated aortic valve area 1.6 cm  Coronary Diagrams  Diagnostic Dominance: Right  Intervention  Implants   No implant documentation for this case.  Syngo Images  Show images for CARDIAC CATHETERIZATION Images on Long Term Storage  Show images for Brien, [place] to Procedure Log  Procedure Log    Hemo Data   Most Recent Value  Fick Cardiac Output 7.71 L/min  Fick Cardiac Output Index 3.51 (L/min)/BSA  RA A Wave 14 mmHg  RA V  Wave 11 mmHg  RA Mean 9 mmHg  RV Systolic Pressure 48 mmHg  RV Diastolic Pressure 5 mmHg  RV EDP 11 mmHg  PA Systolic Pressure 50 mmHg  PA Diastolic Pressure 22 mmHg  PA Mean 34 mmHg  PW A Wave 20 mmHg  PW V Wave 27 mmHg  PW Mean 21 mmHg  AO Systolic Pressure 527 mmHg  AO Diastolic Pressure 42 mmHg  AO Mean 73 mmHg  LV Systolic Pressure 782 mmHg  LV Diastolic Pressure 10 mmHg  LV EDP 17 mmHg  AOp Systolic Pressure 423 mmHg  AOp Diastolic Pressure 45 mmHg  AOp Mean Pressure 77 mmHg  LVp Systolic Pressure 536 mmHg  LVp Diastolic Pressure 12 mmHg  LVp EDP Pressure 20 mmHg  QP/QS 1  TPVR Index 9.68 HRUI  TSVR Index 20.77 HRUI  PVR SVR Ratio 0.2  TPVR/TSVR Ratio 0.47    STS RISK CALCULATOR: Isolated AVR: Risk of Mortality:  1.907%  Renal Failure:  3.671%  Permanent Stroke:  0.752%  Prolonged Ventilation:  6.980%  DSW Infection:  0.098%  Reoperation:  3.732%  Morbidity or Mortality:  12.419%  Short Length of Stay:  40.415%  Long Length of Stay:  5.066%    ASSESSMENT AND PLAN: 53.  69 year old gentleman with acute diastolic heart failure with marked volume overload, noted to have severe aortic stenosis.  I have personally reviewed both his echo images and cardiac catheterization data.  The patient's echo images are very difficult as he has poor acoustic windows.  I cannot assess the morphology of his aortic valve on 2D imaging as it is very poorly seen.  His hemodynamics are suggestive of severe aortic stenosis with a mean transvalvular gradient of 37 mmHg, peak gradient of 59 mmHg, dimensionless index of 0.22, and calculated aortic valve area of 0.7 cm.  His physical exam is also consistent with severe aortic stenosis with a late peaking crescendo decrescendo murmur and diminished A2.  Cardiac catheterization data suggests moderate aortic stenosis.  The patient does not have any high-grade obstructive coronary artery disease.  I think it would be reasonable to perform further  diagnostic studies in this patient with some conflicting data about the severity of his aortic stenosis.  My suspicion is that his aortic stenosis is in fact severe and likely contributing to his congestive heart failure.  I think it would be appropriate to proceed with CT angiography studies of the heart as well as the chest, abdomen, and pelvis to evaluate anatomic features related to his aortic stenosis as well as access options for consideration of TAVR.  I also think transesophageal echo might be helpful to obtain better cardiac imaging and assess etiology of his aortic stenosis as well as the presence of possible aortic insufficiency.  While I do not appreciate a diastolic murmur on his exam, he does have some Doppler data from his echo study suggestive of at least moderate aortic insufficiency.  I have reviewed the natural history of aortic stenosis with the patient today. We have discussed the limitations of medical therapy and the poor prognosis associated with symptomatic aortic stenosis. We have reviewed potential treatment options, including palliative medical therapy, conventional surgical aortic valve replacement, and transcatheter aortic valve replacement. We discussed treatment options in the context of the patient's specific comorbid medical conditions.  We will plan to proceed with further studies as above.  We can determine whether the studies need to be completed as an inpatient or can be deferred to the outpatient setting based on his clinical stability and disposition.  I will review this with Dr. Marlou Porch.  Recommend the following studies:   Gated cardiac CTA-TAVR protocol  CTA of the chest, abdomen, and pelvis to evaluate anatomic suitability for TAVR access  Transesophageal echo to better assess for aortic insufficiency and aortic valve anatomy  Outpatient dental consultation for extraction of remaining teeth  Pulmonary function tests in this patient with presumed COPD and heavy  smoking history  Will arrange OP follow-up once his studies are completed  Deatra James 03/06/2020 1:53 PM     [address] [place] [address]  [phone] (office) [phone] (fax)

## 2020-03-06 NOTE — Progress Notes (Signed)
[facility] PROGRESS NOTE    Jordan [name]  LFY:[id number] DOB: [birth year] DOA: 03/03/2020 PCP: [name], MD      Brief Narrative:  [name] is a 69 y.o. M with hx COPD, DM, HTN, obesity and OSA on CPAP who presented with this of breath and dyspnea on exertion.  Chest x-ray showed bilateral edema, pleural effusions, and the patient was hypoxic.  He was started on diuretics and admitted to the hospital service       Assessment & Plan:  Acute on chronic diastolic CHF  Aortic stenosis -Consult Cardiology, TAVR work up planned  Bradycardia -Hold atenolol  Hypertension Diastolic pressure here low -Hold T[place]turna, atenolol  Diabetes, ruled out A1c 5.3%  -Continue aspirin, atorvastatin  Obesity ruled out BMI 24  Sleep apnea -CPAP at night  Smoking  Stage II pressure injury, left buttocks, present on admission   Depression -Continue venlafaxine    BPH -Hold Flomax  CKD IIIa Cr at baseline     Disposition: Status is: Inpatient  Remains inpatient appropriate because:Requires TAVR evaluation   Dispo: The patient is from: Home              Anticipated d/c is to: Home              Anticipated d/c date is: > 3 days              Patient currently is not medically stable to d/c.   The patient was admitted with congestive heart failure.  Echocardiogram showed aortic stenosis of uncertain severity.  Dr. Burt Knack has been consulted for TAVR and will proceed with his work-up, possibly TAVR placement this hospitalization.           MDM: The below labs and imaging reports were reviewed and summarized above.  Medication management as above.    DVT prophylaxis: enoxaparin (LOVENOX) injection 40 mg Start: 03/03/20 2200  Code Status: FULL Family Communication:           Subjective: The patient is somewhat weak and dizzy.  He has no confusion, fever.  He is tired.  His swelling is resolved.  Objective: Vitals:    03/06/20 1215 03/06/20 1220 03/06/20 1225 03/06/20 1230  BP: (!) 125/53 (!) 123/54 (!) 126/50 (!) 125/48  Pulse: (!) 57 (!) 58 (!) 58 (!) 57  Resp: 13 (!) 7 (!) 51 10  Temp:      TempSrc:      SpO2: 97% 96% 96% 96%  Weight:      Height:        Intake/Output Summary (Last 24 hours) at 03/06/2020 1954 Last data filed at 03/06/2020 1923 Gross per 24 hour  Intake 1830.69 ml  Output 1300 ml  Net 530.69 ml   Filed Weights   03/05/20 0524 03/06/20 0425 03/06/20 0500  Weight: 94.6 kg 84.5 kg 84.8 kg    Examination: General appearance:  adult male, alert and in no acute distress.   HEENT: Anicteric, conjunctiva pink, lids and lashes normal. No nasal deformity, discharge, epistaxis.  Lips moist.   Skin: Warm and dry.  No jaundice.  No suspicious rashes or lesions. Cardiac: RRR, nl E5-I7, colic murmur noted.  Capillary refill is brisk.  Respiratory: Normal respiratory rate and rhythm.  CTAB without rales or wheezes. Abdomen: Abdomen soft.  No TTP guarding. No ascites, distension, hepatosplenomegaly.   MSK: No deformities or effusions. Neuro: Awake and alert.  EOMI, moves all extremities. Speech fluent.    Psych: Sensorium  intact and responding to questions, attention normal. Affect normal.  Judgment and insight appear normal.    Data Reviewed: I have personally reviewed following labs and imaging studies:  CBC: Recent Labs  Lab 03/03/20 1600 03/05/20 0456 03/06/20 0508 03/06/20 1224 03/06/20 1225  WBC 7.6 10.6* 7.4  --   --   NEUTROABS 5.5  --   --   --   --   HGB 13.4 12.0* 12.1* 11.6* 12.9*  HCT 44.2 37.5* 38.6* 34.0* 38.0*  MCV 95.9 92.8 93.0  --   --   PLT 169 189 159  --   --    Basic Metabolic Panel: Recent Labs  Lab 03/03/20 1600 03/03/20 1600 03/04/20 0440 03/04/20 0440 03/05/20 0456 03/05/20 1225 03/06/20 0508 03/06/20 1224 03/06/20 1225  NA 133*   < > 134*   < > 137 137 137 142 141  K 6.1*   < > 5.0   < > 4.0 4.6 4.5 3.6 3.9  CL 97*  --  93*  --  97*  95* 96*  --   --   CO2 28  --  30  --  32 30 33*  --   --   GLUCOSE 87  --  106*  --  90 73 77  --   --   BUN 25*  --  26*  --  34* 35* 32*  --   --   CREATININE 1.10  --  1.16  --  1.64* 1.57* 1.38*  --   --   CALCIUM 8.5*  --  8.1*  --  7.7* 7.8* 7.8*  --   --   MG  --   --   --   --  1.8  --   --   --   --    < > = values in this interval not displayed.   GFR: Estimated Creatinine Clearance: 57.1 mL/min (A) (by C-G formula based on SCr of 1.38 mg/dL (H)). Liver Function Tests: Recent Labs  Lab 03/03/20 1600  AST 25  ALT 13  ALKPHOS 151*  BILITOT 1.3*  PROT 5.6*  ALBUMIN 3.1*   No results for input(s): LIPASE, AMYLASE in the last 168 hours. No results for input(s): AMMONIA in the last 168 hours. Coagulation Profile: No results for input(s): INR, PROTIME in the last 168 hours. Cardiac Enzymes: No results for input(s): CKTOTAL, CKMB, CKMBINDEX, TROPONINI in the last 168 hours. BNP (last 3 results) No results for input(s): PROBNP in the last 8760 hours. HbA1C: Recent Labs    03/05/20 1225  HGBA1C 5.3   CBG: Recent Labs  Lab 03/05/20 2026 03/06/20 0110 03/06/20 0429 03/06/20 0824 03/06/20 1704  GLUCAP 137* 81 83 91 93   Lipid Profile: Recent Labs    03/06/20 0508  CHOL 108  HDL 30*  LDLCALC 64  TRIG 68  CHOLHDL 3.6   Thyroid Function Tests: No results for input(s): TSH, T4TOTAL, FREET4, T3FREE, THYROIDAB in the last 72 hours. Anemia Panel: No results for input(s): VITAMINB12, FOLATE, FERRITIN, TIBC, IRON, RETICCTPCT in the last 72 hours. Urine analysis: No results found for: COLORURINE, APPEARANCEUR, LABSPEC, PHURINE, GLUCOSEU, HGBUR, BILIRUBINUR, KETONESUR, PROTEINUR, UROBILINOGEN, NITRITE, LEUKOCYTESUR Sepsis Labs: @LABRCNTIP (procalcitonin:4,lacticacidven:4)  ) Recent Results (from the past 240 hour(s))  SARS Coronavirus 2 by RT PCR (hospital order, performed in [facility] hospital lab) Nasopharyngeal Nasopharyngeal Swab     Status: None    Collection Time: 03/03/20  4:03 PM   Specimen: Nasopharyngeal Swab  Result Value  Ref Range Status   SARS Coronavirus 2 NEGATIVE NEGATIVE Final    Comment: (NOTE) SARS-CoV-2 target nucleic acids are NOT DETECTED.  The SARS-CoV-2 RNA is generally detectable in upper and lower respiratory specimens during the acute phase of infection. The lowest concentration of SARS-CoV-2 viral copies this assay can detect is 250 copies / mL. A negative result does not preclude SARS-CoV-2 infection and should not be used as the sole basis for treatment or other patient management decisions.  A negative result may occur with improper specimen collection / handling, submission of specimen other than nasopharyngeal swab, presence of viral mutation(s) within the areas targeted by this assay, and inadequate number of viral copies (<250 copies / mL). A negative result must be combined with clinical observations, patient history, and epidemiological information.  Fact Sheet for Patients:   StrictlyIdeas.no  Fact Sheet for Healthcare Providers: BankingDealers.co.za  This test is not yet approved or  cleared by the [place] FDA and has been authorized for detection and/or diagnosis of SARS-CoV-2 by FDA under an Emergency Use Authorization (EUA).  This EUA will remain in effect (meaning this test can be used) for the duration of the COVID-19 declaration under Section 564(b)(1) of the Act, 21 U.S.C. section 360bbb-3(b)(1), unless the authorization is terminated or revoked sooner.  Performed at [facility], [place] [address] [place], [place] [address]          Radiology Studies: DG Orthopantogram  Result Date: 03/06/2020 CLINICAL DATA:  Poor dentition EXAM: ORTHOPANTOGRAM/PANORAMIC COMPARISON:  None. FINDINGS: Two Panorex views of the mandible are obtained. There is excessive patient motion on the study which severely limits evaluation. Only the  lower central incisors, rib lower right lateral incisor, and lower right canine are well visualized and grossly intact. Erosive changes are seen within the bilateral upper premolars and right lower premolar. Other teeth are absent. No acute bony abnormalities. IMPRESSION: 1. Limited study as above, with erosive changes of the bilateral upper and right lower premolars. No bony abnormalities within the mandible and visualized facial bones. Electronically Signed   By: Randa Ngo M.D.   On: 03/06/2020 19:37   CARDIAC CATHETERIZATION  Result Date: 03/06/2020 1.  Nonobstructive coronary artery disease with mild nonobstructive plaquing in the RCA, wide patency of the left main, wide patency of the LAD, and mild to moderate mid circumflex stenosis 2.  Moderate aortic stenosis with mean gradient 26 mmHg and calculated aortic valve area 1.6 cm. 3.  Moderate pulmonary hypertension with mean PA pressure 34 mmHg, transpulmonary gradient 13 mmHg, PVR less than 2 Wood units       Scheduled Meds: . aspirin EC  81 mg Oral Daily  . atorvastatin  80 mg Oral QPC supper  . enoxaparin (LOVENOX) injection  40 mg Subcutaneous Q24H  . feeding supplement (ENSURE ENLIVE)  237 mL Oral BID BM  . folic acid  1 mg Oral Daily  . hydrocerin   Topical TID  . influenza vaccine adjuvanted  0.5 mL Intramuscular Tomorrow-1000  . multivitamin with minerals  1 tablet Oral Daily  . nicotine  21 mg Transdermal Daily  . pantoprazole  40 mg Oral Daily  . sodium chloride flush  3 mL Intravenous Q12H  . sodium chloride flush  3 mL Intravenous Q12H  . venlafaxine XR  150 mg Oral QPC supper   Continuous Infusions: . sodium chloride       LOS: 3 days    Time spent: 25 minutes    Harrell Gave  Shirleen Schirmer, MD [facility] 03/06/2020, 7:54 PM     Please page though [place] or Epic secure chat:  For [employer], Adult nurse

## 2020-03-06 NOTE — H&P (View-Only) (Signed)
Progress Note  Patient Name: Jordan [name] Date of Encounter: 03/06/2020  [facility] Cardiologist: Candee Furbish, MD   Subjective   Breathing continues to improve. No chest pain or palpitations.   Inpatient Medications    Scheduled Meds: . aspirin EC  81 mg Oral Daily  . atorvastatin  80 mg Oral QPC supper  . enoxaparin (LOVENOX) injection  40 mg Subcutaneous Q24H  . feeding supplement (ENSURE ENLIVE)  237 mL Oral BID BM  . folic acid  1 mg Oral Daily  . hydrocerin   Topical TID  . influenza vaccine adjuvanted  0.5 mL Intramuscular Tomorrow-1000  . multivitamin with minerals  1 tablet Oral Daily  . nicotine  21 mg Transdermal Daily  . pantoprazole  40 mg Oral Daily  . sodium chloride flush  3 mL Intravenous Q12H  . venlafaxine XR  150 mg Oral QPC supper   Continuous Infusions: . sodium chloride    . sodium chloride 1 mL/kg/hr (03/06/20 0529)   PRN Meds: sodium chloride, acetaminophen, butalbital-acetaminophen-caffeine, LORazepam, ondansetron (ZOFRAN) IV, sodium chloride flush   Vital Signs    Vitals:   03/05/20 1400 03/05/20 2024 03/06/20 0425 03/06/20 0500  BP: 122/63 (!) 130/37 (!) 123/38   Pulse: 62 (!) 59 (!) 46   Resp: 18 18 20    Temp: 97.8 F (36.6 C) (!) 97.5 F (36.4 C) 98 F (36.7 C)   TempSrc: Oral Oral Oral   SpO2: 96% 94% 96%   Weight:   84.5 kg 84.8 kg  Height:        Intake/Output Summary (Last 24 hours) at 03/06/2020 0832 Last data filed at 03/06/2020 0734 Gross per 24 hour  Intake 360 ml  Output 1000 ml  Net -640 ml   Last 3 Weights 03/06/2020 03/06/2020 03/05/2020  Weight (lbs) 186 lb 14.4 oz 186 lb 4.8 oz 208 lb 8.9 oz  Weight (kg) 84.777 kg 84.505 kg 94.6 kg      Telemetry    Sinus bradycardia in 40-50s- Personally Reviewed  ECG    N/a  Physical Exam   GEN: No acute distress.   Neck: No JVD Cardiac: RRR, 3/6 SE murmurs, rubs, or gallops.  Respiratory: Clear to auscultation bilaterally. GI: Soft, nontender,  non-distended  MS: 1+ edema with ACE wrap; No deformity. Neuro:  Nonfocal  Psych: Normal affect   Labs    High Sensitivity Troponin:  No results for input(s): TROPONINIHS in the last 720 hours.    Chemistry Recent Labs  Lab 03/03/20 1600 03/03/20 1600 03/04/20 0440 03/05/20 0456 03/05/20 1225  NA 133*   < > 134* 137 137  K 6.1*   < > 5.0 4.0 4.6  CL 97*   < > 93* 97* 95*  CO2 28   < > 30 32 30  GLUCOSE 87   < > 106* 90 73  BUN 25*   < > 26* 34* 35*  CREATININE 1.10   < > 1.16 1.64* 1.57*  CALCIUM 8.5*   < > 8.1* 7.7* 7.8*  PROT 5.6*  --   --   --   --   ALBUMIN 3.1*  --   --   --   --   AST 25  --   --   --   --   ALT 13  --   --   --   --   ALKPHOS 151*  --   --   --   --   BILITOT 1.3*  --   --   --   --  GFRNONAA >60   < > >60 42* 44*  GFRAA >60   < > >60 49* 51*  ANIONGAP 8   < > 11 8 12    < > = values in this interval not displayed.     Hematology Recent Labs  Lab 03/03/20 1600 03/05/20 0456 03/06/20 0508  WBC 7.6 10.6* 7.4  RBC 4.61 4.04* 4.15*  HGB 13.4 12.0* 12.1*  HCT 44.2 37.5* 38.6*  MCV 95.9 92.8 93.0  MCH 29.1 29.7 29.2  MCHC 30.3 32.0 31.3  RDW 14.6 14.8 15.1  PLT 169 189 159    BNP Recent Labs  Lab 03/03/20 1600  BNP 2,454.0*     Radiology    DG Chest 2 View  Result Date: 03/05/2020 CLINICAL DATA:  Recent shortness of breath EXAM: CHEST - 2 VIEW COMPARISON:  March 03, 2020. FINDINGS: There is cardiomegaly with a degree of pulmonary venous hypertension. There are pleural effusions bilaterally. There is slight interstitial edema. There is atelectatic change in the lung bases. No adenopathy. There is aortic atherosclerosis. Degenerative change noted in each shoulder. IMPRESSION: Cardiomegaly with pulmonary vascular congestion. Pleural effusions and mild edema. Appearance is felt to be indicative of a degree of congestive heart failure. There is bibasilar atelectasis. No frank consolidation. Aortic Atherosclerosis (ICD10-I70.0).  Electronically Signed   By: Lowella Grip III M.D.   On: 03/05/2020 15:17   ECHOCARDIOGRAM COMPLETE  Result Date: 03/04/2020    ECHOCARDIOGRAM REPORT   Patient Name:   Jordan [name] Date of Exam: 03/04/2020 Medical Rec #:  [id number]         Height:       73.0 in Accession #:    [id number]        Weight:       225.0 lb Date of Birth:  19-Dec-1950         BSA:          2.262 m Patient Age:    68 years          BP:           137/47 mmHg Patient Gender: M                 HR:           50 bpm. Exam Location:  Inpatient Procedure: 2D Echo, Color Doppler, Cardiac Doppler and Intracardiac            Opacification Agent Indications:     B15.17 Acute diastolic (congestive) heart failure  History:         Patient has prior history of Echocardiogram examinations, most                  recent 03/14/2017. CHF, PAD and COPD; Risk Factors:Hypertension,                  Diabetes, Dyslipidemia, Sleep Apnea and Current Smoker.  Sonographer:     Raquel Sarna Senior RDCS Referring Phys:  [place] Diagnosing Phys: Mertie Moores MD  Sonographer Comments: Technically difficult due to COPD. IMPRESSIONS  1. Left ventricular ejection fraction, by estimation, is 55 to 60%. The left ventricle has normal function. The left ventricle has no regional wall motion abnormalities. Left ventricular diastolic function could not be evaluated.  2. Right ventricular systolic function is normal. The right ventricular size is normal.  3. Left atrial size was moderately dilated.  4. Right atrial size was mildly dilated.  5. Large pleural effusion in the left  lateral region.  6. The mitral valve is normal in structure. No evidence of mitral valve regurgitation.  7. The aortic valve is grossly normal. There is moderate calcification of the aortic valve. There is moderate thickening of the aortic valve. Aortic valve regurgitation is mild. Moderate to severe aortic valve stenosis. FINDINGS  Left Ventricle: Left ventricular ejection fraction, by  estimation, is 55 to 60%. The left ventricle has normal function. The left ventricle has no regional wall motion abnormalities. Definity contrast agent was given IV to delineate the left ventricular  endocardial borders. The left ventricular internal cavity size was normal in size. There is no left ventricular hypertrophy. Left ventricular diastolic function could not be evaluated due to atrial fibrillation. Left ventricular diastolic function could  not be evaluated. Right Ventricle: The right ventricular size is normal. No increase in right ventricular wall thickness. Right ventricular systolic function is normal. Left Atrium: Left atrial size was moderately dilated. Right Atrium: Right atrial size was mildly dilated. Pericardium: There is no evidence of pericardial effusion. Mitral Valve: The mitral valve is normal in structure. No evidence of mitral valve regurgitation. Tricuspid Valve: The tricuspid valve is grossly normal. Tricuspid valve regurgitation is not demonstrated. Aortic Valve: The aortic valve is grossly normal. There is moderate calcification of the aortic valve. There is moderate thickening of the aortic valve. There is moderate aortic valve annular calcification. Aortic valve regurgitation is mild. Aortic regurgitation PHT measures 524 msec. Moderate to severe aortic stenosis is present. Aortic valve mean gradient measures 36.5 mmHg. Aortic valve peak gradient measures 58.8 mmHg. Aortic valve area, by VTI measures 0.68 cm. Pulmonic Valve: The pulmonic valve was not well visualized. Pulmonic valve regurgitation is not visualized. Aorta: The aortic root and ascending aorta are structurally normal, with no evidence of dilitation. IAS/Shunts: The atrial septum is grossly normal. Additional Comments: There is a large pleural effusion in the left lateral region.  LEFT VENTRICLE PLAX 2D LVIDd:         5.70 cm  Diastology LVIDs:         4.00 cm  LV e' medial:    5.00 cm/s LV PW:         1.10 cm  LV E/e'  medial:  16.8 LV IVS:        0.90 cm  LV e' lateral:   7.94 cm/s LVOT diam:     2.00 cm  LV E/e' lateral: 10.6 LV SV:         70 LV SV Index:   31 LVOT Area:     3.14 cm  RIGHT VENTRICLE RV S prime:     8.92 cm/s TAPSE (M-mode): 1.8 cm LEFT ATRIUM            Index       RIGHT ATRIUM           Index LA diam:      5.20 cm  2.30 cm/m  RA Area:     20.10 cm LA Vol (A2C): 66.8 ml  29.53 ml/m RA Volume:   64.90 ml  28.69 ml/m LA Vol (A4C): 103.0 ml 45.53 ml/m  AORTIC VALVE AV Area (Vmax):    0.66 cm AV Area (Vmean):   0.61 cm AV Area (VTI):     0.68 cm AV Vmax:           383.50 cm/s AV Vmean:          285.000 cm/s AV VTI:  1.030 m AV Peak Grad:      58.8 mmHg AV Mean Grad:      36.5 mmHg LVOT Vmax:         80.23 cm/s LVOT Vmean:        55.267 cm/s LVOT VTI:          0.224 m LVOT/AV VTI ratio: 0.22 AI PHT:            524 msec  AORTA Ao Root diam: 3.00 cm MITRAL VALVE MV Area (PHT): 2.65 cm    SHUNTS MV Decel Time: 286 msec    Systemic VTI:  0.22 m MV E velocity: 84.00 cm/s  Systemic Diam: 2.00 cm MV A velocity: 26.10 cm/s MV E/A ratio:  3.22 Mertie Moores MD Electronically signed by Mertie Moores MD Signature Date/Time: 03/04/2020/1:06:57 PM    Final (Updated)     Cardiac Studies   Echo 03/04/20 1. Left ventricular ejection fraction, by estimation, is 55 to 60%. The  left ventricle has normal function. The left ventricle has no regional  wall motion abnormalities. Left ventricular diastolic function could not  be evaluated.  2. Right ventricular systolic function is normal. The right ventricular  size is normal.  3. Left atrial size was moderately dilated.  4. Right atrial size was mildly dilated.  5. Large pleural effusion in the left lateral region.  6. The mitral valve is normal in structure. No evidence of mitral valve  regurgitation.  7. The aortic valve is grossly normal. There is moderate calcification of  the aortic valve. There is moderate thickening of the aortic valve.  Aortic  valve regurgitation is mild. Moderate to severe aortic valve stenosis.   Patient Profile     Jordan [name] is a 69 y.o. male with a hx of hypertension, hyperlipidemia, diabetes mellitus, COPD with ongoing tobacco smoking and sleep apnea who is being seen for the evaluation of CHF and aortic stenosis at the request of Dr. Posey Pronto.  Patient reported greater than 62-months history of dyspnea on exertion along with orthopnea, PND, and lower extremity edema.  Recently worsened in past few weeks. No exertional chest tightness.   Assessment & Plan    1. Acute on chronic diastolic heart failure in setting of aortic stenosis - He diuresed 6.3L. weight 225>>186lb. Due to bump in renal function yesterday to 1.64 from 1.1 his lasix was held. SCr improved to 1.57 today. Getting fluids for cath.  - Further diuresis pending evaluation by cath   2. Moderate to severe aortic stenosis - Symptomatic.  - Aortic valve mean gradient measures 36.5 mmHg. Aortic valve peak  gradient measures 58.8 mmHg. Aortic valve area, by VTI measures 0.68 cm. - R  & L cath today with suspected underlying CAD  3. Sinus bradycardia - Last dose of atenolol 25mg  was 9/15.  No pathologic brady arrhythmias.   4. HTN - BP stable - BB on hold as above  5. COPD - No active wheezing  6. Tobacco smoking - Cessation recommended  7. HLD - Pending lipid profile  - Continue Lipitor 80mg  qd    For questions or updates, please contact [facility] Please consult [email or web address] for contact info under        SignedLeanor Kail, PA  03/06/2020, 8:32 AM    Personally seen and examined. Agree with above.   Going for cardiac catheterization.  Right and left.  Assess fluid status.  Assess aortic stenosis.  Creatinine improved today.  Hydrating prior to cath.  Creatinine did bump with prior diuresis.  COPD noted.  Tobacco cessation.  Could have underlying triple-vessel coronary artery disease which  may put him for candidacy for aortic valve replacement and CABG.  We shall see.  Bradycardia noted.  We will go ahead and hold his beta-blocker atenolol.  Candee Furbish, MD

## 2020-03-06 NOTE — Progress Notes (Addendum)
ReDS Clip Diuretic Study Pt study # [id number]  Your patient has been enrolled in the ReDS Clip Diuretic Study   Changes to prescribed diuretics recommended:  None - pending RHC today Provider contacted: Dr. Marlou Porch   REDS Clip  READING= 43%  CHEST RULER = 33 Clip Station = D   Orthodema score = 1 Signs/Symptoms Score   Mild edema, no orthopnea 0 No congestion  Moderate edema, no orthopnea 1 Low-grade orthodema/congestion  Severe edema OR orthopnea 2   Moderate edema and orthopnea 3 High-grade orthodema/congestion  Severe edema AND orthopnea 4    Jordan [name], PharmD, BCPS Phone [phone] 03/06/2020       9:30 AM  Please check [email or web address] for unit-specific pharmacist phone numbers

## 2020-03-06 NOTE — Progress Notes (Signed)
Physical Therapy Treatment Patient Details Name: Jordan [name] MRN: [id number] DOB: [birth year] Today's Date: 03/06/2020    History of Present Illness Pt is a 69 y/o male admited secondary to increased weakness and SOB. Thought to be from acute CHF. PMH includes COPD, DM, HTN, and tobacco use.     PT Comments    Pt asleep in bed after catheterization. Pt easily roused but reports he feels tired. BP 111/34 in supine when asked pt reports dizziness at rest. With bed exercises pt BP increased to 122/39 however pt still with c/o dizziness and tiredness. D/c plans remain appropriate. PT will follow back for stair training tomorrow if he has not discharged today.    Follow Up Recommendations  Home health PT     Equipment Recommendations  Rolling walker with 5" wheels       Precautions / Restrictions Precautions Precautions: Fall Restrictions Weight Bearing Restrictions: No    Mobility  Bed Mobility               General bed mobility comments: deferred due to dizziness in supine         Balance Overall balance assessment: Needs assistance Sitting-balance support: No upper extremity supported;Feet supported Sitting balance-Leahy Scale: Fair     Standing balance support: Single extremity supported;During functional activity Standing balance-Leahy Scale: Poor Standing balance comment: Reliant on at least 1 UE support                             Cognition Arousal/Alertness: Awake/alert Behavior During Therapy: WFL for tasks assessed/performed Overall Cognitive Status: Within Functional Limits for tasks assessed                                        Exercises General Exercises - Lower Extremity Ankle Circles/Pumps: AROM;Both;10 reps;Supine Quad Sets: AROM;Both;10 reps;Supine Heel Slides: AROM;Both;10 reps;Supine Hip ABduction/ADduction: AROM;Both;10 reps;Supine Straight Leg Raises: AROM;Both;10 reps;Seated    General Comments  General comments (skin integrity, edema, etc.): initially BP 111/34, with bed exercise BP increased to 122/39, HR 53 -59bpm, SaO2 on 3L >92%O2 throughout session       Pertinent Vitals/Pain Pain Assessment: No/denies pain           PT Goals (current goals can now be found in the care plan section) Acute Rehab PT Goals Patient Stated Goal: to go home PT Goal Formulation: With patient Time For Goal Achievement: 03/18/20 Potential to Achieve Goals: Good Progress towards PT goals: Not progressing toward goals - comment (limited by BP)    Frequency    Min 3X/week      PT Plan Current plan remains appropriate       AM-PAC PT "6 Clicks" Mobility   Outcome Measure  Help needed turning from your back to your side while in a flat bed without using bedrails?: None Help needed moving from lying on your back to sitting on the side of a flat bed without using bedrails?: None Help needed moving to and from a bed to a chair (including a wheelchair)?: A Little Help needed standing up from a chair using your arms (e.g., wheelchair or bedside chair)?: A Little Help needed to walk in hospital room?: A Little Help needed climbing 3-5 steps with a railing? : A Lot 6 Click Score: 19    End of Session Equipment Utilized During Treatment: Oxygen Activity  Tolerance: Treatment limited secondary to medical complications (Comment) (decreased BP) Patient left: in bed;with call bell/phone within reach (in ED ) Nurse Communication: Mobility status PT Visit Diagnosis: Unsteadiness on feet (R26.81);Muscle weakness (generalized) (M62.81)     Time: 1460-4799 PT Time Calculation (min) (ACUTE ONLY): 22 min  Charges:  $Therapeutic Exercise: 8-22 mins                     Kemuel Buchmann B. Migdalia Dk PT, DPT Acute Rehabilitation Services Pager [phone] Office [phone]    [place] 03/06/2020, 3:37 PM

## 2020-03-07 ENCOUNTER — Inpatient Hospital Stay (HOSPITAL_COMMUNITY): Payer: Medicare PPO

## 2020-03-07 ENCOUNTER — Encounter (HOSPITAL_COMMUNITY): Payer: Self-pay | Admitting: Internal Medicine

## 2020-03-07 DIAGNOSIS — Z954 Presence of other heart-valve replacement: Secondary | ICD-10-CM

## 2020-03-07 DIAGNOSIS — R600 Localized edema: Secondary | ICD-10-CM

## 2020-03-07 DIAGNOSIS — E785 Hyperlipidemia, unspecified: Secondary | ICD-10-CM

## 2020-03-07 DIAGNOSIS — I350 Nonrheumatic aortic (valve) stenosis: Secondary | ICD-10-CM

## 2020-03-07 LAB — CBC
HCT: 39.1 % (ref 39.0–52.0)
Hemoglobin: 12.3 g/dL — ABNORMAL LOW (ref 13.0–17.0)
MCH: 29.9 pg (ref 26.0–34.0)
MCHC: 31.5 g/dL (ref 30.0–36.0)
MCV: 94.9 fL (ref 80.0–100.0)
Platelets: 145 10*3/uL — ABNORMAL LOW (ref 150–400)
RBC: 4.12 MIL/uL — ABNORMAL LOW (ref 4.22–5.81)
RDW: 15 % (ref 11.5–15.5)
WBC: 6.8 10*3/uL (ref 4.0–10.5)
nRBC: 0 % (ref 0.0–0.2)

## 2020-03-07 LAB — BASIC METABOLIC PANEL
Anion gap: 9 (ref 5–15)
BUN: 26 mg/dL — ABNORMAL HIGH (ref 8–23)
CO2: 28 mmol/L (ref 22–32)
Calcium: 8.2 mg/dL — ABNORMAL LOW (ref 8.9–10.3)
Chloride: 102 mmol/L (ref 98–111)
Creatinine, Ser: 0.97 mg/dL (ref 0.61–1.24)
GFR calc Af Amer: >60 mL/min (ref >60)
GFR calc non Af Amer: >60 mL/min (ref >60)
Glucose, Bld: 83 mg/dL (ref 70–99)
Potassium: 4.3 mmol/L (ref 3.5–5.1)
Sodium: 139 mmol/L (ref 135–145)

## 2020-03-07 LAB — GLUCOSE, CAPILLARY
Glucose-Capillary: 88 mg/dL (ref 70–99)
Glucose-Capillary: 86 mg/dL (ref 70–99)
Glucose-Capillary: 77 mg/dL (ref 70–99)
Glucose-Capillary: 121 mg/dL — ABNORMAL HIGH (ref 70–99)
Glucose-Capillary: 91 mg/dL (ref 70–99)
Glucose-Capillary: 88 mg/dL (ref 70–99)

## 2020-03-07 MED ORDER — IOHEXOL 350 MG/ML SOLN
100.0000 mL | Freq: Once | INTRAVENOUS | Status: AC | PRN
  Administered 2020-03-07: 100 mL via INTRAVENOUS

## 2020-03-07 MED ORDER — IPRATROPIUM-ALBUTEROL 0.5-2.5 (3) MG/3ML IN SOLN: 3.0000 mL | Freq: Four times a day (QID) | RESPIRATORY_TRACT | Status: DC | PRN

## 2020-03-07 MED ORDER — BUDESONIDE 0.25 MG/2ML IN SUSP
0.2500 mg | Freq: Two times a day (BID) | RESPIRATORY_TRACT | Status: DC
  Administered 2020-03-07 – 2020-03-12 (×9): 0.25 mg via RESPIRATORY_TRACT
  Filled 2020-03-07 (×11): qty 2

## 2020-03-07 MED ORDER — ALBUTEROL SULFATE HFA 108 (90 BASE) MCG/ACT IN AERS: 2.0000 | INHALATION_SPRAY | Freq: Four times a day (QID) | RESPIRATORY_TRACT | Status: DC | PRN

## 2020-03-07 NOTE — Progress Notes (Signed)
[facility]  PROGRESS NOTE  Jordan [name] WUJ:[id number] DOB: [birth year] DOA: 03/03/2020 PCP: [name], MD   Brief HPI:   69 year old male with history of COPD, diabetes mellitus type 2, hypertension, obesity, OSA on CPAP presents with shortness of breath on exertion.  Chest x-ray showed bilateral edema, pleural effusion.  Patient was started on diuretics and admitted to hospitalist service.  Cardiology was consulted for severe aortic stenosis seen on echocardiogram.    Subjective   Patient seen and examined, wants to start medications for COPD.  Work-up underway for preop for TAVR   Assessment/Plan:     1. Severe aortic stenosis-conflicting evaluation for aortic stenosis as per cardiology, patient might need TEE for further evaluation.  In the meantime CTA cardiac TAVR protocol ordered, CT chest/abdomen/pelvis to evaluate anatomic suitability for TAVR access.  Cardiology following 2. Acute on chronic diastolic heart failure-Lasix on hold due to rising creatinine. 3. COPD-mild tachypnea, we will start Pulmicort nebulization twice a day, DuoNeb nebulizer every 6 hours as needed. 4. Bradycardia-atenolol on hold. 5. Hypertension-blood pressure is now mildly elevated, blood pressure medications will be restarted as per cardiology discretion.   6. Sleep apnea-continue CPAP at bedtime 7. Depression-continue venlafaxine     COVID-19 Labs  No results for input(s): DDIMER, FERRITIN, LDH, CRP in the last 72 hours.  Lab Results  Component Value Date   [place] NEGATIVE 03/03/2020     Scheduled medications:   . aspirin EC  81 mg Oral Daily  . atorvastatin  80 mg Oral QPC supper  . budesonide (PULMICORT) nebulizer solution  0.25 mg Nebulization BID  . enoxaparin (LOVENOX) injection  40 mg Subcutaneous Q24H  . feeding supplement (ENSURE ENLIVE)  237 mL Oral BID BM  . folic acid  1 mg Oral Daily  . hydrocerin   Topical TID  . influenza vaccine adjuvanted  0.5 mL  Intramuscular Tomorrow-1000  . multivitamin with minerals  1 tablet Oral Daily  . nicotine  21 mg Transdermal Daily  . pantoprazole  40 mg Oral Daily  . sodium chloride flush  3 mL Intravenous Q12H  . sodium chloride flush  3 mL Intravenous Q12H  . venlafaxine XR  150 mg Oral QPC supper         CBG: Recent Labs  Lab 03/06/20 2109 03/07/20 0016 03/07/20 0355 03/07/20 0802 03/07/20 1134  GLUCAP 134* 88 86 77 121*    SpO2: 96 % O2 Flow Rate (L/min): 2 L/min    CBC: Recent Labs  Lab 03/03/20 1600 03/03/20 1600 03/05/20 0456 03/06/20 0508 03/06/20 1224 03/06/20 1225 03/07/20 0458  WBC 7.6  --  10.6* 7.4  --   --  6.8  NEUTROABS 5.5  --   --   --   --   --   --   HGB 13.4   < > 12.0* 12.1* 11.6* 12.9* 12.3*  HCT 44.2   < > 37.5* 38.6* 34.0* 38.0* 39.1  MCV 95.9  --  92.8 93.0  --   --  94.9  PLT 169  --  189 159  --   --  145*   < > = values in this interval not displayed.    Basic Metabolic Panel: Recent Labs  Lab 03/04/20 0440 03/04/20 0440 03/05/20 0456 03/05/20 0456 03/05/20 1225 03/06/20 0508 03/06/20 1224 03/06/20 1225 03/07/20 0458  NA 134*   < > 137   < > 137 137 142 141 139  K 5.0   < > 4.0   < >  4.6 4.5 3.6 3.9 4.3  CL 93*  --  97*  --  95* 96*  --   --  102  CO2 30  --  32  --  30 33*  --   --  28  GLUCOSE 106*  --  90  --  73 77  --   --  83  BUN 26*  --  34*  --  35* 32*  --   --  26*  CREATININE 1.16  --  1.64*  --  1.57* 1.38*  --   --  0.97  CALCIUM 8.1*  --  7.7*  --  7.8* 7.8*  --   --  8.2*  MG  --   --  1.8  --   --   --   --   --   --    < > = values in this interval not displayed.     Liver Function Tests: Recent Labs  Lab 03/03/20 1600  AST 25  ALT 13  ALKPHOS 151*  BILITOT 1.3*  PROT 5.6*  ALBUMIN 3.1*     Antibiotics: Anti-infectives (From admission, onward)   None       DVT prophylaxis: Lovenox  Code Status: Full code  Family Communication: No family at bedside    Status is: Inpatient  Dispo: The  patient is from: Home              Anticipated d/c is to: Home              Anticipated d/c date is: 03/09/2020              Patient currently not medically stable for discharge  Barrier to discharge-currently undergoing preop work-up for TAVR  Pressure Injury 03/14/17 Stage II -  Partial thickness loss of dermis presenting as a shallow open ulcer with a red, pink wound bed without slough. (Active)  03/14/17   Location: Buttocks  Location Orientation: Right;Left  Staging: Stage II -  Partial thickness loss of dermis presenting as a shallow open ulcer with a red, pink wound bed without slough.  Wound Description (Comments):   Present on Admission: Yes    Consultants:  Cardiology  Procedures:  Echocardiogram   Objective   Vitals:   03/07/20 0400 03/07/20 0625 03/07/20 0720 03/07/20 1044  BP: (!) 142/52  (!) 147/40 (!) 130/39  Pulse:   (!) 55 (!) 56  Resp: 17  17 17   Temp:   98.1 F (36.7 C) (!) 97.5 F (36.4 C)  TempSrc:   Oral Oral  SpO2: 95%  92% 96%  Weight:  84 kg    Height:        Intake/Output Summary (Last 24 hours) at 03/07/2020 1348 Last data filed at 03/07/2020 1300 Gross per 24 hour  Intake 1447.21 ml  Output 900 ml  Net 547.21 ml    09/16 1901 - 09/18 0700 In: 1830.7 [P.O.:840; I.V.:990.7] Out: 1600 [Urine:1600]  Filed Weights   03/06/20 0425 03/06/20 0500 03/07/20 0625  Weight: 84.5 kg 84.8 kg 84 kg    Physical Examination:    General-appears in no acute distress  Heart-S1-S2, regular, grade 3/6 systolic murmur at the aortic area  Lungs-scattered rhonchi, mild tachypnea  Abdomen-soft, nontender, no organomegaly  Extremities-no edema in the lower extremities  Neuro-alert, oriented x3, no focal deficit noted    Data Reviewed:   Recent Results (from the past 240 hour(s))  SARS Coronavirus 2 by RT PCR (hospital order, performed  in [place] lab) Nasopharyngeal Nasopharyngeal Swab     Status: None   Collection Time:  03/03/20  4:03 PM   Specimen: Nasopharyngeal Swab  Result Value Ref Range Status   SARS Coronavirus 2 NEGATIVE NEGATIVE Final    Comment: (NOTE) SARS-CoV-2 target nucleic acids are NOT DETECTED.  The SARS-CoV-2 RNA is generally detectable in upper and lower respiratory specimens during the acute phase of infection. The lowest concentration of SARS-CoV-2 viral copies this assay can detect is 250 copies / mL. A negative result does not preclude SARS-CoV-2 infection and should not be used as the sole basis for treatment or other patient management decisions.  A negative result may occur with improper specimen collection / handling, submission of specimen other than nasopharyngeal swab, presence of viral mutation(s) within the areas targeted by this assay, and inadequate number of viral copies (<250 copies / mL). A negative result must be combined with clinical observations, patient history, and epidemiological information.  Fact Sheet for Patients:   StrictlyIdeas.no  Fact Sheet for Healthcare Providers: BankingDealers.co.za  This test is not yet approved or  cleared by the [place] FDA and has been authorized for detection and/or diagnosis of SARS-CoV-2 by FDA under an Emergency Use Authorization (EUA).  This EUA will remain in effect (meaning this test can be used) for the duration of the COVID-19 declaration under Section 564(b)(1) of the Act, 21 U.S.C. section 360bbb-3(b)(1), unless the authorization is terminated or revoked sooner.  Performed at [facility], [place] [address], [place], [place] [address]     No results for input(s): LIPASE, AMYLASE in the last 168 hours. No results for input(s): AMMONIA in the last 168 hours.  Cardiac Enzymes: No results for input(s): CKTOTAL, CKMB, CKMBINDEX, TROPONINI in the last 168 hours. BNP (last 3 results) Recent Labs    03/03/20 1600  BNP 2,454.0*    ProBNP (last 3  results) No results for input(s): PROBNP in the last 8760 hours.  Studies:  DG Orthopantogram  Result Date: 03/06/2020 CLINICAL DATA:  Poor dentition EXAM: ORTHOPANTOGRAM/PANORAMIC COMPARISON:  None. FINDINGS: Two Panorex views of the mandible are obtained. There is excessive patient motion on the study which severely limits evaluation. Only the lower central incisors, rib lower right lateral incisor, and lower right canine are well visualized and grossly intact. Erosive changes are seen within the bilateral upper premolars and right lower premolar. Other teeth are absent. No acute bony abnormalities. IMPRESSION: 1. Limited study as above, with erosive changes of the bilateral upper and right lower premolars. No bony abnormalities within the mandible and visualized facial bones. Electronically Signed   By: Randa Ngo M.D.   On: 03/06/2020 19:37   CARDIAC CATHETERIZATION  Result Date: 03/06/2020 1.  Nonobstructive coronary artery disease with mild nonobstructive plaquing in the RCA, wide patency of the left main, wide patency of the LAD, and mild to moderate mid circumflex stenosis 2.  Moderate aortic stenosis with mean gradient 26 mmHg and calculated aortic valve area 1.6 cm. 3.  Moderate pulmonary hypertension with mean PA pressure 34 mmHg, transpulmonary gradient 13 mmHg, PVR less than 2 Wood units  CT CORONARY MORPH W/CTA COR W/SCORE W/CA W/CM &/OR WO/CM  Result Date: 03/07/2020 EXAM: OVER-READ INTERPRETATION  CT CHEST The following report is an over-read performed by radiologist Dr. Salvatore Marvel of [facility], [place] on 03/07/2020. This over-read does not include interpretation of cardiac or coronary anatomy or pathology. The coronary CTA interpretation by the cardiologist is attached.  COMPARISON:  None. FINDINGS: Please see the separate concurrent chest CT angiogram report for details. IMPRESSION: Please see the separate concurrent chest CT angiogram report for details. Electronically  Signed   By: Ilona Sorrel M.D.   On: 03/07/2020 12:38   CT ANGIO CHEST AORTA W/CM & OR WO/CM  Result Date: 03/07/2020 CLINICAL DATA:  Inpatient. Severe aortic stenosis. Acute on chronic heart failure. TAVR evaluation. EXAM: CT ANGIOGRAPHY CHEST, ABDOMEN AND PELVIS TECHNIQUE: Multidetector CT imaging through the chest, abdomen and pelvis was performed using the standard protocol during bolus administration of intravenous contrast. Multiplanar reconstructed images and MIPs were obtained and reviewed to evaluate the vascular anatomy. CONTRAST:  119mL OMNIPAQUE IOHEXOL 350 MG/ML SOLN COMPARISON:  03/04/2020 chest radiograph. FINDINGS: CTA CHEST FINDINGS Cardiovascular: Mild cardiomegaly. No significant pericardial effusion/thickening. Three-vessel coronary atherosclerosis. Atherosclerotic nonaneurysmal thoracic aorta, including ascending thoracic aortic atherosclerotic calcification. Diffuse thickening and coarse calcification of the aortic valve. Normal caliber pulmonary arteries. No central pulmonary emboli. Mediastinum/Nodes: No discrete thyroid nodules. Unremarkable esophagus. No axillary adenopathy. Mild right paratracheal adenopathy up to 1.4 cm (series 6/image 60). Mildly enlarged 1.1 cm subcarinal node (series 6/image 70). Mild AP window adenopathy up to 1.3 cm (series 6/image 59). Mild bilateral hilar adenopathy up to 1.4 cm on the right (series 6/image 66) and 1.2 cm on the left (series 6/image 65). Lungs/Pleura: No pneumothorax. Moderate dependent bilateral pleural effusions. Moderate dependent bilateral lower lobe passive atelectasis. Mild diffuse interlobular septal thickening. No acute consolidative airspace disease, lung masses or significant pulmonary nodules. Musculoskeletal: No aggressive appearing focal osseous lesions. Incompletely healed posterior right ninth through twelfth rib fractures. Moderate thoracic spondylosis. CTA ABDOMEN AND PELVIS FINDINGS Hepatobiliary: Normal liver with no liver  mass. Normal gallbladder with no radiopaque cholelithiasis. No biliary ductal dilatation. Pancreas: Normal, with no mass or duct dilation. Spleen: Normal size. No mass. Adrenals/Urinary Tract: Normal adrenals. No hydronephrosis. Simple 2.5 cm anterior upper left renal cyst. Additional subcentimeter hypodense renal cortical lesions scattered in both kidneys are too small to characterize and require no follow-up. Excreted contrast in the bladder. Mild diffuse bladder wall thickening and trabeculation with tiny bladder diverticula. Stomach/Bowel: Normal non-distended stomach. Normal caliber small bowel with no small bowel wall thickening. Normal appendix. Moderate colorectal stool. No large bowel wall thickening, diverticulosis or significant pericolonic fat stranding. Vascular/Lymphatic: Atherosclerotic nonaneurysmal abdominal aorta. Mild porta hepatis adenopathy up to 1.3 cm (series 6/image 119). No pelvic adenopathy. Reproductive: Top-normal size prostate. Other: No pneumoperitoneum, ascites or focal fluid collection. Musculoskeletal: No aggressive appearing focal osseous lesions. Marked lumbar spondylosis. VASCULAR MEASUREMENTS PERTINENT TO TAVR: AORTA: Minimal Aortic Diameter-17.3 x 17.2 mm Severity of Aortic Calcification-moderate RIGHT PELVIS: Right Common Iliac Artery - Minimal Diameter-8.1 x 6.7 mm Tortuosity-moderate Calcification-severe Right External Iliac Artery - Minimal Diameter-8.4 x 7.8 mm Tortuosity-moderate Calcification-mild-to-moderate Right Common Femoral Artery - Minimal Diameter-9.5 x 8.0 mm Tortuosity-mild Calcification-severe LEFT PELVIS: Left Common Iliac Artery - Minimal Diameter-8.3 x 7.2 mm Tortuosity-moderate Calcification-severe Left External Iliac Artery - Minimal Diameter-7.6 x 6.8 mm Tortuosity-mild-to-moderate Calcification-moderate Left Common Femoral Artery - Minimal Diameter-9.9 x 8.0 mm Tortuosity-mild Calcification-moderate Review of the MIP images confirms the above findings.  IMPRESSION: 1. Vascular findings and measurements pertinent to potential TAVR procedure, as detailed. 2. Diffuse thickening and coarse calcification of the aortic valve, compatible with the reported history of severe aortic stenosis. 3. Mild cardiomegaly. Three-vessel coronary atherosclerosis. 4. Moderate dependent bilateral pleural effusions. 5. Mild diffuse interlobular septal thickening, compatible with mild pulmonary edema. 6. Mild porta hepatis, mild mediastinal  and mild bilateral hilar lymphadenopathy, nonspecific, potentially reactive. Suggest follow-up CT chest, abdomen and pelvis with IV contrast in 3 months. 7. Mild diffuse bladder wall thickening and trabeculation with tiny bladder diverticula, suggesting nonspecific chronic bladder voiding dysfunction. 8. Moderate colorectal stool, suggesting constipation. 9. Incompletely healed posterior right ninth through twelfth rib fractures. 10. Aortic Atherosclerosis (ICD10-I70.0). Electronically Signed   By: Ilona Sorrel M.D.   On: 03/07/2020 13:03   CT Angio Abd/Pel w/ and/or w/o  Result Date: 03/07/2020 CLINICAL DATA:  Inpatient. Severe aortic stenosis. Acute on chronic heart failure. TAVR evaluation. EXAM: CT ANGIOGRAPHY CHEST, ABDOMEN AND PELVIS TECHNIQUE: Multidetector CT imaging through the chest, abdomen and pelvis was performed using the standard protocol during bolus administration of intravenous contrast. Multiplanar reconstructed images and MIPs were obtained and reviewed to evaluate the vascular anatomy. CONTRAST:  178mL OMNIPAQUE IOHEXOL 350 MG/ML SOLN COMPARISON:  03/04/2020 chest radiograph. FINDINGS: CTA CHEST FINDINGS Cardiovascular: Mild cardiomegaly. No significant pericardial effusion/thickening. Three-vessel coronary atherosclerosis. Atherosclerotic nonaneurysmal thoracic aorta, including ascending thoracic aortic atherosclerotic calcification. Diffuse thickening and coarse calcification of the aortic valve. Normal caliber pulmonary  arteries. No central pulmonary emboli. Mediastinum/Nodes: No discrete thyroid nodules. Unremarkable esophagus. No axillary adenopathy. Mild right paratracheal adenopathy up to 1.4 cm (series 6/image 60). Mildly enlarged 1.1 cm subcarinal node (series 6/image 70). Mild AP window adenopathy up to 1.3 cm (series 6/image 59). Mild bilateral hilar adenopathy up to 1.4 cm on the right (series 6/image 66) and 1.2 cm on the left (series 6/image 65). Lungs/Pleura: No pneumothorax. Moderate dependent bilateral pleural effusions. Moderate dependent bilateral lower lobe passive atelectasis. Mild diffuse interlobular septal thickening. No acute consolidative airspace disease, lung masses or significant pulmonary nodules. Musculoskeletal: No aggressive appearing focal osseous lesions. Incompletely healed posterior right ninth through twelfth rib fractures. Moderate thoracic spondylosis. CTA ABDOMEN AND PELVIS FINDINGS Hepatobiliary: Normal liver with no liver mass. Normal gallbladder with no radiopaque cholelithiasis. No biliary ductal dilatation. Pancreas: Normal, with no mass or duct dilation. Spleen: Normal size. No mass. Adrenals/Urinary Tract: Normal adrenals. No hydronephrosis. Simple 2.5 cm anterior upper left renal cyst. Additional subcentimeter hypodense renal cortical lesions scattered in both kidneys are too small to characterize and require no follow-up. Excreted contrast in the bladder. Mild diffuse bladder wall thickening and trabeculation with tiny bladder diverticula. Stomach/Bowel: Normal non-distended stomach. Normal caliber small bowel with no small bowel wall thickening. Normal appendix. Moderate colorectal stool. No large bowel wall thickening, diverticulosis or significant pericolonic fat stranding. Vascular/Lymphatic: Atherosclerotic nonaneurysmal abdominal aorta. Mild porta hepatis adenopathy up to 1.3 cm (series 6/image 119). No pelvic adenopathy. Reproductive: Top-normal size prostate. Other: No  pneumoperitoneum, ascites or focal fluid collection. Musculoskeletal: No aggressive appearing focal osseous lesions. Marked lumbar spondylosis. VASCULAR MEASUREMENTS PERTINENT TO TAVR: AORTA: Minimal Aortic Diameter-17.3 x 17.2 mm Severity of Aortic Calcification-moderate RIGHT PELVIS: Right Common Iliac Artery - Minimal Diameter-8.1 x 6.7 mm Tortuosity-moderate Calcification-severe Right External Iliac Artery - Minimal Diameter-8.4 x 7.8 mm Tortuosity-moderate Calcification-mild-to-moderate Right Common Femoral Artery - Minimal Diameter-9.5 x 8.0 mm Tortuosity-mild Calcification-severe LEFT PELVIS: Left Common Iliac Artery - Minimal Diameter-8.3 x 7.2 mm Tortuosity-moderate Calcification-severe Left External Iliac Artery - Minimal Diameter-7.6 x 6.8 mm Tortuosity-mild-to-moderate Calcification-moderate Left Common Femoral Artery - Minimal Diameter-9.9 x 8.0 mm Tortuosity-mild Calcification-moderate Review of the MIP images confirms the above findings. IMPRESSION: 1. Vascular findings and measurements pertinent to potential TAVR procedure, as detailed. 2. Diffuse thickening and coarse calcification of the aortic valve, compatible with the reported history of severe aortic stenosis.  3. Mild cardiomegaly. Three-vessel coronary atherosclerosis. 4. Moderate dependent bilateral pleural effusions. 5. Mild diffuse interlobular septal thickening, compatible with mild pulmonary edema. 6. Mild porta hepatis, mild mediastinal and mild bilateral hilar lymphadenopathy, nonspecific, potentially reactive. Suggest follow-up CT chest, abdomen and pelvis with IV contrast in 3 months. 7. Mild diffuse bladder wall thickening and trabeculation with tiny bladder diverticula, suggesting nonspecific chronic bladder voiding dysfunction. 8. Moderate colorectal stool, suggesting constipation. 9. Incompletely healed posterior right ninth through twelfth rib fractures. 10. Aortic Atherosclerosis (ICD10-I70.0). Electronically Signed   By: Ilona Sorrel M.D.   On: 03/07/2020 13:03       [place]   [facility] If 7PM-7AM, please contact night-coverage at [email or web address], Office  [phone]   03/07/2020, 1:48 PM  LOS: 4 days

## 2020-03-07 NOTE — Progress Notes (Signed)
ReDS Clip Diuretic Study Pt study # [id number]  Your patient has been enrolled in the ReDS Clip Diuretic Study  Uop -1 L/24 hr. Wt down another lb today. Scr down from 1.38 to 0.97. Cath on 9/17 showing RA 14, PCWP 21.   Changes to prescribed diuretics recommended:  No adjustments to adding diuretics at this time - will continue to monitor. On lasix 40 mg PO prn PTA.   Provider contacted: Dr. Marlou Porch   REDS Clip  READING= 27%  CHEST RULER = 33 Clip Station = D   Orthodema score = 0 Signs/Symptoms Score   Mild edema, no orthopnea 0 No congestion  Moderate edema, no orthopnea 1 Low-grade orthodema/congestion  Severe edema OR orthopnea 2   Moderate edema and orthopnea 3 High-grade orthodema/congestion  Severe edema AND orthopnea 4    Antonietta Jewel, PharmD, BCCCP Clinical Pharmacist  Phone: [phone] 03/07/2020 9:00 AM  Please check AMION for all [place] phone numbers After 10:00 PM, call [place] [phone]

## 2020-03-07 NOTE — Progress Notes (Signed)
PT Cancellation Note  Patient Details Name: Jordan [name] MRN: [id number] DOB: [birth year]   Cancelled Treatment:    Reason Eval/Treat Not Completed: Patient at procedure or test/unavailable. Per RN patient is about to leave floor for procedure. Will check back later as time allows.     Baran Kuhrt 03/07/2020, 10:49 AM

## 2020-03-07 NOTE — Progress Notes (Signed)
Physical Therapy Treatment Patient Details Name: Jordan [name] MRN: [id number] DOB: Dec 31, 1950 Today's Date: 03/07/2020    History of Present Illness Pt is a 69 y/o male admited secondary to increased weakness and SOB. Thought to be from acute CHF. PMH includes COPD, DM, HTN, and tobacco use.     PT Comments    Patient received in bed. Reports he is tired. Does not get any rest here. Agrees to PT session. Patient is mod independent with bed mobility, transfers with min guard from elevated surface. He is able to ambulate with RW 50 feet with min guard. Flexed posture and fatigued with this. Reports he is too tired to do exercises right now. He will continue to benefit from skilled PT while here to improve strength and activity tolerance for safety at home.      Follow Up Recommendations  Home health PT     Equipment Recommendations  Rolling walker with 5" wheels    Recommendations for Other Services       Precautions / Restrictions Precautions Precautions: Fall Restrictions Weight Bearing Restrictions: No    Mobility  Bed Mobility Overal bed mobility: Modified Independent Bed Mobility: Supine to Sit;Sit to Supine     Supine to sit: Modified independent (Device/Increase time) Sit to supine: Modified independent (Device/Increase time)   General bed mobility comments: use of bed rails, increased time and effort.  Transfers Overall transfer level: Modified independent Equipment used: Rolling walker (2 wheeled) Transfers: Sit to/from Stand Sit to Stand: From elevated surface;Modified independent (Device/Increase time)            Ambulation/Gait Ambulation/Gait assistance: Min guard Gait Distance (Feet): 50 Feet Assistive device: Rolling walker (2 wheeled) Gait Pattern/deviations: Step-through pattern;Decreased stride length;Trunk flexed;Decreased step length - right;Decreased step length - left Gait velocity: Decreased   General Gait Details: mild instability  and fatigue with ambulation, no lob.   Stairs             Wheelchair Mobility    Modified Rankin (Stroke Patients Only)       Balance Overall balance assessment: Needs assistance Sitting-balance support: Feet supported Sitting balance-Leahy Scale: Good     Standing balance support: Bilateral upper extremity supported;During functional activity Standing balance-Leahy Scale: Fair Standing balance comment: Reliant on at least 1 UE support                             Cognition Arousal/Alertness: Awake/alert Behavior During Therapy: WFL for tasks assessed/performed Overall Cognitive Status: Within Functional Limits for tasks assessed                                        Exercises      General Comments        Pertinent Vitals/Pain Pain Assessment: No/denies pain    Home Living                      Prior Function            PT Goals (current goals can now be found in the care plan section) Acute Rehab PT Goals Patient Stated Goal: to go home PT Goal Formulation: With patient Time For Goal Achievement: 03/18/20 Potential to Achieve Goals: Good Progress towards PT goals: Progressing toward goals    Frequency    Min 3X/week  PT Plan      Co-evaluation              AM-PAC PT "6 Clicks" Mobility   Outcome Measure  Help needed turning from your back to your side while in a flat bed without using bedrails?: None Help needed moving from lying on your back to sitting on the side of a flat bed without using bedrails?: None Help needed moving to and from a bed to a chair (including a wheelchair)?: A Little Help needed standing up from a chair using your arms (e.g., wheelchair or bedside chair)?: A Little Help needed to walk in hospital room?: A Little Help needed climbing 3-5 steps with a railing? : A Lot 6 Click Score: 19    End of Session Equipment Utilized During Treatment: Gait belt Activity  Tolerance: Patient limited by fatigue Patient left: in bed;with call bell/phone within reach Nurse Communication: Mobility status PT Visit Diagnosis: Muscle weakness (generalized) (M62.81);Difficulty in walking, not elsewhere classified (R26.2)     Time: 1335-1406 PT Time Calculation (min) (ACUTE ONLY): 31 min  Charges:  $Gait Training: 23-37 mins                     Melanny Wire, PT, GCS 03/07/20,2:15 PM

## 2020-03-07 NOTE — Progress Notes (Signed)
VASCULAR LAB    Carotid duplex has been performed.  See CV proc for preliminary results.   Tyneka Scafidi, RVT 03/07/2020, 2:54 PM

## 2020-03-07 NOTE — Progress Notes (Signed)
Progress Note  Patient Name: Jordan [name] Date of Encounter: 03/07/2020  [facility] Cardiologist: Candee Furbish, MD   Subjective   Feel better, no CP  Inpatient Medications    Scheduled Meds: . aspirin EC  81 mg Oral Daily  . atorvastatin  80 mg Oral QPC supper  . budesonide (PULMICORT) nebulizer solution  0.25 mg Nebulization BID  . enoxaparin (LOVENOX) injection  40 mg Subcutaneous Q24H  . feeding supplement (ENSURE ENLIVE)  237 mL Oral BID BM  . folic acid  1 mg Oral Daily  . hydrocerin   Topical TID  . influenza vaccine adjuvanted  0.5 mL Intramuscular Tomorrow-1000  . multivitamin with minerals  1 tablet Oral Daily  . nicotine  21 mg Transdermal Daily  . pantoprazole  40 mg Oral Daily  . sodium chloride flush  3 mL Intravenous Q12H  . sodium chloride flush  3 mL Intravenous Q12H  . venlafaxine XR  150 mg Oral QPC supper   Continuous Infusions: . sodium chloride     PRN Meds: sodium chloride, acetaminophen, butalbital-acetaminophen-caffeine, LORazepam, ondansetron (ZOFRAN) IV, sodium chloride flush   Vital Signs    Vitals:   03/07/20 0357 03/07/20 0400 03/07/20 0625 03/07/20 0720  BP:  (!) 142/52  (!) 147/40  Pulse:    (!) 55  Resp: 18 17  17   Temp: 97.7 F (36.5 C)   98.1 F (36.7 C)  TempSrc: Oral   Oral  SpO2: 95% 95%  92%  Weight:   84 kg   Height:        Intake/Output Summary (Last 24 hours) at 03/07/2020 0957 Last data filed at 03/07/2020 0834 Gross per 24 hour  Intake 1687.21 ml  Output 750 ml  Net 937.21 ml   Last 3 Weights 03/07/2020 03/06/2020 03/06/2020  Weight (lbs) 185 lb 3 oz 186 lb 14.4 oz 186 lb 4.8 oz  Weight (kg) 84 kg 84.777 kg 84.505 kg      Telemetry    No adverse arrhythmias- Personally Reviewed    Physical Exam   GEN: No acute distress.   Neck: No JVD Cardiac: RRR, 3/6 SM, no rubs, or gallops.  Respiratory: Clear to auscultation bilaterally. GI: Soft, nontender, non-distended  MS: Mild edema; No  deformity. Neuro:  Nonfocal  Psych: Normal affect   Labs    High Sensitivity Troponin:  No results for input(s): TROPONINIHS in the last 720 hours.    Chemistry Recent Labs  Lab 03/03/20 1600 03/04/20 0440 03/05/20 1225 03/05/20 1225 03/06/20 0508 03/06/20 0508 03/06/20 1224 03/06/20 1225 03/07/20 0458  NA 133*   < > 137   < > 137   < > 142 141 139  K 6.1*   < > 4.6   < > 4.5   < > 3.6 3.9 4.3  CL 97*   < > 95*  --  96*  --   --   --  102  CO2 28   < > 30  --  33*  --   --   --  28  GLUCOSE 87   < > 73  --  77  --   --   --  83  BUN 25*   < > 35*  --  32*  --   --   --  26*  CREATININE 1.10   < > 1.57*  --  1.38*  --   --   --  0.97  CALCIUM 8.5*   < > 7.8*  --  7.8*  --   --   --  8.2*  PROT 5.6*  --   --   --   --   --   --   --   --   ALBUMIN 3.1*  --   --   --   --   --   --   --   --   AST 25  --   --   --   --   --   --   --   --   ALT 13  --   --   --   --   --   --   --   --   ALKPHOS 151*  --   --   --   --   --   --   --   --   BILITOT 1.3*  --   --   --   --   --   --   --   --   GFRNONAA >60   < > 44*  --  52*  --   --   --  >60  GFRAA >60   < > 51*  --  >60  --   --   --  >60  ANIONGAP 8   < > 12  --  8  --   --   --  9   < > = values in this interval not displayed.     Hematology Recent Labs  Lab 03/05/20 0456 03/05/20 0456 03/06/20 0508 03/06/20 0508 03/06/20 1224 03/06/20 1225 03/07/20 0458  WBC 10.6*  --  7.4  --   --   --  6.8  RBC 4.04*  --  4.15*  --   --   --  4.12*  HGB 12.0*   < > 12.1*   < > 11.6* 12.9* 12.3*  HCT 37.5*   < > 38.6*   < > 34.0* 38.0* 39.1  MCV 92.8  --  93.0  --   --   --  94.9  MCH 29.7  --  29.2  --   --   --  29.9  MCHC 32.0  --  31.3  --   --   --  31.5  RDW 14.8  --  15.1  --   --   --  15.0  PLT 189  --  159  --   --   --  145*   < > = values in this interval not displayed.    BNP Recent Labs  Lab 03/03/20 1600  BNP 2,454.0*     DDimer No results for input(s): DDIMER in the last 168 hours.   Radiology     DG Orthopantogram  Result Date: 03/06/2020 CLINICAL DATA:  Poor dentition EXAM: ORTHOPANTOGRAM/PANORAMIC COMPARISON:  None. FINDINGS: Two Panorex views of the mandible are obtained. There is excessive patient motion on the study which severely limits evaluation. Only the lower central incisors, rib lower right lateral incisor, and lower right canine are well visualized and grossly intact. Erosive changes are seen within the bilateral upper premolars and right lower premolar. Other teeth are absent. No acute bony abnormalities. IMPRESSION: 1. Limited study as above, with erosive changes of the bilateral upper and right lower premolars. No bony abnormalities within the mandible and visualized facial bones. Electronically Signed   By: Randa Ngo M.D.   On: 03/06/2020 19:37   CARDIAC CATHETERIZATION  Result Date: 03/06/2020 1.  Nonobstructive coronary artery  disease with mild nonobstructive plaquing in the RCA, wide patency of the left main, wide patency of the LAD, and mild to moderate mid circumflex stenosis 2.  Moderate aortic stenosis with mean gradient 26 mmHg and calculated aortic valve area 1.6 cm. 3.  Moderate pulmonary hypertension with mean PA pressure 34 mmHg, transpulmonary gradient 13 mmHg, PVR less than 2 Wood units   Cardiac Studies   Cardiac catheterization, echocardiogram personally reviewed.  Echo does have poor images but appears to be severe aortic stenosis.  Patient Profile     69 y.o. male with severe aortic stenosis, TAVR evaluation, Dr. Burt Knack  Assessment & Plan    Severe aortic stenosis -TAVR evaluation Dr. Burt Knack.  Acute on chronic diastolic heart failure -Lasix was held after increasing creatinine noted.  Now back down.  Feeling better.  Bradycardia -Atenolol on hold.  Essential hypertension -Holding atenolol and [place]  Once TAVR evaluation done, should be able to be discharged.      For questions or updates, please contact [place] Please  consult [email or web address] for contact info under        Signed, Candee Furbish, MD  03/07/2020, 9:57 AM

## 2020-03-08 LAB — GLUCOSE, CAPILLARY
Glucose-Capillary: 72 mg/dL (ref 70–99)
Glucose-Capillary: 75 mg/dL (ref 70–99)
Glucose-Capillary: 102 mg/dL — ABNORMAL HIGH (ref 70–99)
Glucose-Capillary: 124 mg/dL — ABNORMAL HIGH (ref 70–99)

## 2020-03-08 LAB — BASIC METABOLIC PANEL
Anion gap: 6 (ref 5–15)
BUN: 20 mg/dL (ref 8–23)
CO2: 29 mmol/L (ref 22–32)
Calcium: 8.3 mg/dL — ABNORMAL LOW (ref 8.9–10.3)
Chloride: 100 mmol/L (ref 98–111)
Creatinine, Ser: 0.97 mg/dL (ref 0.61–1.24)
GFR calc Af Amer: >60 mL/min (ref >60)
GFR calc non Af Amer: >60 mL/min (ref >60)
Glucose, Bld: 75 mg/dL (ref 70–99)
Potassium: 4.7 mmol/L (ref 3.5–5.1)
Sodium: 135 mmol/L (ref 135–145)

## 2020-03-08 LAB — CBC
HCT: 37.4 % — ABNORMAL LOW (ref 39.0–52.0)
Hemoglobin: 11.7 g/dL — ABNORMAL LOW (ref 13.0–17.0)
MCH: 29.6 pg (ref 26.0–34.0)
MCHC: 31.3 g/dL (ref 30.0–36.0)
MCV: 94.7 fL (ref 80.0–100.0)
Platelets: 144 10*3/uL — ABNORMAL LOW (ref 150–400)
RBC: 3.95 MIL/uL — ABNORMAL LOW (ref 4.22–5.81)
RDW: 14.7 % (ref 11.5–15.5)
WBC: 7.5 10*3/uL (ref 4.0–10.5)
nRBC: 0 % (ref 0.0–0.2)

## 2020-03-08 NOTE — Progress Notes (Signed)
ReDS Clip Diuretic Study Pt study # [id number]  Your patient has been enrolled in the ReDS Clip Diuretic Study  Uop -1.6 L/24 hr. Wt charted as down 18 lb today? Scr stable at 0.97.   Changes to prescribed diuretics recommended:  No adjustments to adding diuretics at this time - will continue to monitor. On lasix 40 mg PO prn PTA.   Provider contacted: Dr. Marlou Porch   REDS Clip  READING= 31%  CHEST RULER = 33 Clip Station = D   Orthodema score = 0 Signs/Symptoms Score   Mild edema, no orthopnea 0 No congestion  Moderate edema, no orthopnea 1 Low-grade orthodema/congestion  Severe edema OR orthopnea 2   Moderate edema and orthopnea 3 High-grade orthodema/congestion  Severe edema AND orthopnea 4    Antonietta Jewel, PharmD, BCCCP Clinical Pharmacist  Phone: [phone] 03/08/2020 9:03 AM  Please check AMION for all [place] phone numbers After 10:00 PM, call [place] [phone]

## 2020-03-08 NOTE — Progress Notes (Signed)
Progress Note  Patient Name: Jordan [name] Date of Encounter: 03/08/2020  [facility] Cardiologist: Candee Furbish, MD   Subjective   Denies any fevers chills nausea vomiting syncope bleeding. No chest pain no shortness of breath. Undergoing work-up for TAVR.  Had carotid duplex done yesterday afternoon.  Inpatient Medications    Scheduled Meds: . aspirin EC  81 mg Oral Daily  . atorvastatin  80 mg Oral QPC supper  . budesonide (PULMICORT) nebulizer solution  0.25 mg Nebulization BID  . enoxaparin (LOVENOX) injection  40 mg Subcutaneous Q24H  . feeding supplement (ENSURE ENLIVE)  237 mL Oral BID BM  . folic acid  1 mg Oral Daily  . hydrocerin   Topical TID  . influenza vaccine adjuvanted  0.5 mL Intramuscular Tomorrow-1000  . multivitamin with minerals  1 tablet Oral Daily  . nicotine  21 mg Transdermal Daily  . pantoprazole  40 mg Oral Daily  . sodium chloride flush  3 mL Intravenous Q12H  . sodium chloride flush  3 mL Intravenous Q12H  . venlafaxine XR  150 mg Oral QPC supper   Continuous Infusions: . sodium chloride     PRN Meds: sodium chloride, acetaminophen, butalbital-acetaminophen-caffeine, ipratropium-albuterol, LORazepam, ondansetron (ZOFRAN) IV, sodium chloride flush   Vital Signs    Vitals:   03/07/20 2227 03/08/20 0011 03/08/20 0450 03/08/20 0745  BP: (!) 162/52 (!) 162/50 (!) 147/54 (!) 149/54  Pulse: 70  (!) 54 (!) 51  Resp: 19 18 18 20   Temp: 98.7 F (37.1 C) 98.1 F (36.7 C) (!) 97.4 F (36.3 C) (!) 97.4 F (36.3 C)  TempSrc: Oral Oral Oral Oral  SpO2: 94% 94% 91% 100%  Weight:   76.1 kg   Height:        Intake/Output Summary (Last 24 hours) at 03/08/2020 0834 Last data filed at 03/08/2020 0600 Gross per 24 hour  Intake 483 ml  Output 1500 ml  Net -1017 ml   Last 3 Weights 03/08/2020 03/07/2020 03/06/2020  Weight (lbs) 167 lb 12.8 oz 185 lb 3 oz 186 lb 14.4 oz  Weight (kg) 76.114 kg 84 kg 84.777 kg      Telemetry    No adverse  arrhythmias- Personally Reviewed    Physical Exam   GEN: No acute distress.   Neck: No JVD Cardiac: RRR, 3/6 SM, no rubs, or gallops.  Respiratory: Clear to auscultation bilaterally. GI: Soft, nontender, non-distended  MS: Mild edema; No deformity. Neuro:  Nonfocal  Psych: Normal affect   Labs    High Sensitivity Troponin:  No results for input(s): TROPONINIHS in the last 720 hours.    Chemistry Recent Labs  Lab 03/03/20 1600 03/04/20 0440 03/05/20 1225 03/05/20 1225 03/06/20 0508 03/06/20 0508 03/06/20 1224 03/06/20 1225 03/07/20 0458  NA 133*   < > 137   < > 137   < > 142 141 139  K 6.1*   < > 4.6   < > 4.5   < > 3.6 3.9 4.3  CL 97*   < > 95*  --  96*  --   --   --  102  CO2 28   < > 30  --  33*  --   --   --  28  GLUCOSE 87   < > 73  --  77  --   --   --  83  BUN 25*   < > 35*  --  32*  --   --   --  26*  CREATININE 1.10   < > 1.57*  --  1.38*  --   --   --  0.97  CALCIUM 8.5*   < > 7.8*  --  7.8*  --   --   --  8.2*  PROT 5.6*  --   --   --   --   --   --   --   --   ALBUMIN 3.1*  --   --   --   --   --   --   --   --   AST 25  --   --   --   --   --   --   --   --   ALT 13  --   --   --   --   --   --   --   --   ALKPHOS 151*  --   --   --   --   --   --   --   --   BILITOT 1.3*  --   --   --   --   --   --   --   --   GFRNONAA >60   < > 44*  --  52*  --   --   --  >60  GFRAA >60   < > 51*  --  >60  --   --   --  >60  ANIONGAP 8   < > 12  --  8  --   --   --  9   < > = values in this interval not displayed.     Hematology Recent Labs  Lab 03/05/20 0456 03/05/20 0456 03/06/20 0508 03/06/20 0508 03/06/20 1224 03/06/20 1225 03/07/20 0458  WBC 10.6*  --  7.4  --   --   --  6.8  RBC 4.04*  --  4.15*  --   --   --  4.12*  HGB 12.0*   < > 12.1*   < > 11.6* 12.9* 12.3*  HCT 37.5*   < > 38.6*   < > 34.0* 38.0* 39.1  MCV 92.8  --  93.0  --   --   --  94.9  MCH 29.7  --  29.2  --   --   --  29.9  MCHC 32.0  --  31.3  --   --   --  31.5  RDW 14.8  --  15.1   --   --   --  15.0  PLT 189  --  159  --   --   --  145*   < > = values in this interval not displayed.    BNP Recent Labs  Lab 03/03/20 1600  BNP 2,454.0*     DDimer No results for input(s): DDIMER in the last 168 hours.   Radiology    DG Orthopantogram  Result Date: 03/06/2020 CLINICAL DATA:  Poor dentition EXAM: ORTHOPANTOGRAM/PANORAMIC COMPARISON:  None. FINDINGS: Two Panorex views of the mandible are obtained. There is excessive patient motion on the study which severely limits evaluation. Only the lower central incisors, rib lower right lateral incisor, and lower right canine are well visualized and grossly intact. Erosive changes are seen within the bilateral upper premolars and right lower premolar. Other teeth are absent. No acute bony abnormalities. IMPRESSION: 1. Limited study as above, with erosive changes of the bilateral upper and right lower premolars. No bony abnormalities  within the mandible and visualized facial bones. Electronically Signed   By: Randa Ngo M.D.   On: 03/06/2020 19:37   CARDIAC CATHETERIZATION  Result Date: 03/06/2020 1.  Nonobstructive coronary artery disease with mild nonobstructive plaquing in the RCA, wide patency of the left main, wide patency of the LAD, and mild to moderate mid circumflex stenosis 2.  Moderate aortic stenosis with mean gradient 26 mmHg and calculated aortic valve area 1.6 cm. 3.  Moderate pulmonary hypertension with mean PA pressure 34 mmHg, transpulmonary gradient 13 mmHg, PVR less than 2 Wood units  CT CORONARY MORPH W/CTA COR W/SCORE W/CA W/CM &/OR WO/CM  Result Date: 03/07/2020 EXAM: OVER-READ INTERPRETATION  CT CHEST The following report is an over-read performed by radiologist Dr. Salvatore Marvel of [facility], [place] on 03/07/2020. This over-read does not include interpretation of cardiac or coronary anatomy or pathology. The coronary CTA interpretation by the cardiologist is attached. COMPARISON:  None. FINDINGS:  Please see the separate concurrent chest CT angiogram report for details. IMPRESSION: Please see the separate concurrent chest CT angiogram report for details. Electronically Signed   By: Ilona Sorrel M.D.   On: 03/07/2020 12:38   CT ANGIO CHEST AORTA W/CM & OR WO/CM  Result Date: 03/07/2020 CLINICAL DATA:  Inpatient. Severe aortic stenosis. Acute on chronic heart failure. TAVR evaluation. EXAM: CT ANGIOGRAPHY CHEST, ABDOMEN AND PELVIS TECHNIQUE: Multidetector CT imaging through the chest, abdomen and pelvis was performed using the standard protocol during bolus administration of intravenous contrast. Multiplanar reconstructed images and MIPs were obtained and reviewed to evaluate the vascular anatomy. CONTRAST:  143mL OMNIPAQUE IOHEXOL 350 MG/ML SOLN COMPARISON:  03/04/2020 chest radiograph. FINDINGS: CTA CHEST FINDINGS Cardiovascular: Mild cardiomegaly. No significant pericardial effusion/thickening. Three-vessel coronary atherosclerosis. Atherosclerotic nonaneurysmal thoracic aorta, including ascending thoracic aortic atherosclerotic calcification. Diffuse thickening and coarse calcification of the aortic valve. Normal caliber pulmonary arteries. No central pulmonary emboli. Mediastinum/Nodes: No discrete thyroid nodules. Unremarkable esophagus. No axillary adenopathy. Mild right paratracheal adenopathy up to 1.4 cm (series 6/image 60). Mildly enlarged 1.1 cm subcarinal node (series 6/image 70). Mild AP window adenopathy up to 1.3 cm (series 6/image 59). Mild bilateral hilar adenopathy up to 1.4 cm on the right (series 6/image 66) and 1.2 cm on the left (series 6/image 65). Lungs/Pleura: No pneumothorax. Moderate dependent bilateral pleural effusions. Moderate dependent bilateral lower lobe passive atelectasis. Mild diffuse interlobular septal thickening. No acute consolidative airspace disease, lung masses or significant pulmonary nodules. Musculoskeletal: No aggressive appearing focal osseous lesions.  Incompletely healed posterior right ninth through twelfth rib fractures. Moderate thoracic spondylosis. CTA ABDOMEN AND PELVIS FINDINGS Hepatobiliary: Normal liver with no liver mass. Normal gallbladder with no radiopaque cholelithiasis. No biliary ductal dilatation. Pancreas: Normal, with no mass or duct dilation. Spleen: Normal size. No mass. Adrenals/Urinary Tract: Normal adrenals. No hydronephrosis. Simple 2.5 cm anterior upper left renal cyst. Additional subcentimeter hypodense renal cortical lesions scattered in both kidneys are too small to characterize and require no follow-up. Excreted contrast in the bladder. Mild diffuse bladder wall thickening and trabeculation with tiny bladder diverticula. Stomach/Bowel: Normal non-distended stomach. Normal caliber small bowel with no small bowel wall thickening. Normal appendix. Moderate colorectal stool. No large bowel wall thickening, diverticulosis or significant pericolonic fat stranding. Vascular/Lymphatic: Atherosclerotic nonaneurysmal abdominal aorta. Mild porta hepatis adenopathy up to 1.3 cm (series 6/image 119). No pelvic adenopathy. Reproductive: Top-normal size prostate. Other: No pneumoperitoneum, ascites or focal fluid collection. Musculoskeletal: No aggressive appearing focal osseous lesions. Marked lumbar spondylosis. VASCULAR MEASUREMENTS PERTINENT TO  TAVR: AORTA: Minimal Aortic Diameter-17.3 x 17.2 mm Severity of Aortic Calcification-moderate RIGHT PELVIS: Right Common Iliac Artery - Minimal Diameter-8.1 x 6.7 mm Tortuosity-moderate Calcification-severe Right External Iliac Artery - Minimal Diameter-8.4 x 7.8 mm Tortuosity-moderate Calcification-mild-to-moderate Right Common Femoral Artery - Minimal Diameter-9.5 x 8.0 mm Tortuosity-mild Calcification-severe LEFT PELVIS: Left Common Iliac Artery - Minimal Diameter-8.3 x 7.2 mm Tortuosity-moderate Calcification-severe Left External Iliac Artery - Minimal Diameter-7.6 x 6.8 mm  Tortuosity-mild-to-moderate Calcification-moderate Left Common Femoral Artery - Minimal Diameter-9.9 x 8.0 mm Tortuosity-mild Calcification-moderate Review of the MIP images confirms the above findings. IMPRESSION: 1. Vascular findings and measurements pertinent to potential TAVR procedure, as detailed. 2. Diffuse thickening and coarse calcification of the aortic valve, compatible with the reported history of severe aortic stenosis. 3. Mild cardiomegaly. Three-vessel coronary atherosclerosis. 4. Moderate dependent bilateral pleural effusions. 5. Mild diffuse interlobular septal thickening, compatible with mild pulmonary edema. 6. Mild porta hepatis, mild mediastinal and mild bilateral hilar lymphadenopathy, nonspecific, potentially reactive. Suggest follow-up CT chest, abdomen and pelvis with IV contrast in 3 months. 7. Mild diffuse bladder wall thickening and trabeculation with tiny bladder diverticula, suggesting nonspecific chronic bladder voiding dysfunction. 8. Moderate colorectal stool, suggesting constipation. 9. Incompletely healed posterior right ninth through twelfth rib fractures. 10. Aortic Atherosclerosis (ICD10-I70.0). Electronically Signed   By: Ilona Sorrel M.D.   On: 03/07/2020 13:03   VAS US CAROTID  Result Date: 03/07/2020 Carotid Arterial Duplex Study Risk Factors:      Hypertension, Diabetes. Other Factors:     Aortic stenosis, pre-TAVR. Limitations        Today's exam was limited due to facial hair, body habitus and                    the patient's respiratory variation. Comparison Study:  No prior study on file Performing Technologist: Sharion Dove RVS  Examination Guidelines: A complete evaluation includes B-mode imaging, spectral Doppler, color Doppler, and power Doppler as needed of all accessible portions of each vessel. Bilateral testing is considered an integral part of a complete examination. Limited examinations for reoccurring indications may be performed as noted.  Right  Carotid Findings: +----------+--------+--------+--------+------------------+------------------+           PSV cm/sEDV cm/sStenosisPlaque DescriptionComments           +----------+--------+--------+--------+------------------+------------------+ CCA Prox  63      9                                 intimal thickening +----------+--------+--------+--------+------------------+------------------+ CCA Distal63      7                                 intimal thickening +----------+--------+--------+--------+------------------+------------------+ ICA Prox  68      8               heterogenous                         +----------+--------+--------+--------+------------------+------------------+ ICA Distal89      14                                tortuous           +----------+--------+--------+--------+------------------+------------------+ ECA       47      3                                                    +----------+--------+--------+--------+------------------+------------------+ +----------+--------+-------+--------+-------------------+  PSV cm/sEDV cmsDescribeArm Pressure (mmHG) +----------+--------+-------+--------+-------------------+ Subclavian99                                         +----------+--------+-------+--------+-------------------+ +---------+--------+--+--------+--+ VertebralPSV cm/s51EDV cm/s14 +---------+--------+--+--------+--+  Left Carotid Findings: +----------+--------+--------+--------+------------------+------------------+           PSV cm/sEDV cm/sStenosisPlaque DescriptionComments           +----------+--------+--------+--------+------------------+------------------+ CCA Prox  89      17                                intimal thickening +----------+--------+--------+--------+------------------+------------------+ CCA Distal78      14                                intimal thickening  +----------+--------+--------+--------+------------------+------------------+ ICA Prox  105     27              heterogenous                         +----------+--------+--------+--------+------------------+------------------+ ICA Distal77      18                                tortuous           +----------+--------+--------+--------+------------------+------------------+ ECA       138     12                                                   +----------+--------+--------+--------+------------------+------------------+ +----------+--------+--------+--------+-------------------+           PSV cm/sEDV cm/sDescribeArm Pressure (mmHG) +----------+--------+--------+--------+-------------------+ [id number]                                          +----------+--------+--------+--------+-------------------+ +---------+--------+--+--------+--+ VertebralPSV cm/s51EDV cm/s12 +---------+--------+--+--------+--+   Summary: Right Carotid: The extracranial vessels were near-normal with only minimal wall                thickening or plaque. Left Carotid: The extracranial vessels were near-normal with only minimal wall               thickening or plaque. Vertebrals:  Bilateral vertebral arteries demonstrate antegrade flow. Subclavians: Normal flow hemodynamics were seen in bilateral subclavian              arteries. *See table(s) above for measurements and observations.     Preliminary    CT Angio Abd/Pel w/ and/or w/o  Result Date: 03/07/2020 CLINICAL DATA:  Inpatient. Severe aortic stenosis. Acute on chronic heart failure. TAVR evaluation. EXAM: CT ANGIOGRAPHY CHEST, ABDOMEN AND PELVIS TECHNIQUE: Multidetector CT imaging through the chest, abdomen and pelvis was performed using the standard protocol during bolus administration of intravenous contrast. Multiplanar reconstructed images and MIPs were obtained and reviewed to evaluate the vascular anatomy. CONTRAST:  172mL OMNIPAQUE IOHEXOL  350 MG/ML SOLN COMPARISON:  03/04/2020 chest radiograph. FINDINGS: CTA CHEST FINDINGS Cardiovascular: Mild cardiomegaly. No significant pericardial effusion/thickening. Three-vessel coronary atherosclerosis. Atherosclerotic  nonaneurysmal thoracic aorta, including ascending thoracic aortic atherosclerotic calcification. Diffuse thickening and coarse calcification of the aortic valve. Normal caliber pulmonary arteries. No central pulmonary emboli. Mediastinum/Nodes: No discrete thyroid nodules. Unremarkable esophagus. No axillary adenopathy. Mild right paratracheal adenopathy up to 1.4 cm (series 6/image 60). Mildly enlarged 1.1 cm subcarinal node (series 6/image 70). Mild AP window adenopathy up to 1.3 cm (series 6/image 59). Mild bilateral hilar adenopathy up to 1.4 cm on the right (series 6/image 66) and 1.2 cm on the left (series 6/image 65). Lungs/Pleura: No pneumothorax. Moderate dependent bilateral pleural effusions. Moderate dependent bilateral lower lobe passive atelectasis. Mild diffuse interlobular septal thickening. No acute consolidative airspace disease, lung masses or significant pulmonary nodules. Musculoskeletal: No aggressive appearing focal osseous lesions. Incompletely healed posterior right ninth through twelfth rib fractures. Moderate thoracic spondylosis. CTA ABDOMEN AND PELVIS FINDINGS Hepatobiliary: Normal liver with no liver mass. Normal gallbladder with no radiopaque cholelithiasis. No biliary ductal dilatation. Pancreas: Normal, with no mass or duct dilation. Spleen: Normal size. No mass. Adrenals/Urinary Tract: Normal adrenals. No hydronephrosis. Simple 2.5 cm anterior upper left renal cyst. Additional subcentimeter hypodense renal cortical lesions scattered in both kidneys are too small to characterize and require no follow-up. Excreted contrast in the bladder. Mild diffuse bladder wall thickening and trabeculation with tiny bladder diverticula. Stomach/Bowel: Normal non-distended  stomach. Normal caliber small bowel with no small bowel wall thickening. Normal appendix. Moderate colorectal stool. No large bowel wall thickening, diverticulosis or significant pericolonic fat stranding. Vascular/Lymphatic: Atherosclerotic nonaneurysmal abdominal aorta. Mild porta hepatis adenopathy up to 1.3 cm (series 6/image 119). No pelvic adenopathy. Reproductive: Top-normal size prostate. Other: No pneumoperitoneum, ascites or focal fluid collection. Musculoskeletal: No aggressive appearing focal osseous lesions. Marked lumbar spondylosis. VASCULAR MEASUREMENTS PERTINENT TO TAVR: AORTA: Minimal Aortic Diameter-17.3 x 17.2 mm Severity of Aortic Calcification-moderate RIGHT PELVIS: Right Common Iliac Artery - Minimal Diameter-8.1 x 6.7 mm Tortuosity-moderate Calcification-severe Right External Iliac Artery - Minimal Diameter-8.4 x 7.8 mm Tortuosity-moderate Calcification-mild-to-moderate Right Common Femoral Artery - Minimal Diameter-9.5 x 8.0 mm Tortuosity-mild Calcification-severe LEFT PELVIS: Left Common Iliac Artery - Minimal Diameter-8.3 x 7.2 mm Tortuosity-moderate Calcification-severe Left External Iliac Artery - Minimal Diameter-7.6 x 6.8 mm Tortuosity-mild-to-moderate Calcification-moderate Left Common Femoral Artery - Minimal Diameter-9.9 x 8.0 mm Tortuosity-mild Calcification-moderate Review of the MIP images confirms the above findings. IMPRESSION: 1. Vascular findings and measurements pertinent to potential TAVR procedure, as detailed. 2. Diffuse thickening and coarse calcification of the aortic valve, compatible with the reported history of severe aortic stenosis. 3. Mild cardiomegaly. Three-vessel coronary atherosclerosis. 4. Moderate dependent bilateral pleural effusions. 5. Mild diffuse interlobular septal thickening, compatible with mild pulmonary edema. 6. Mild porta hepatis, mild mediastinal and mild bilateral hilar lymphadenopathy, nonspecific, potentially reactive. Suggest follow-up CT  chest, abdomen and pelvis with IV contrast in 3 months. 7. Mild diffuse bladder wall thickening and trabeculation with tiny bladder diverticula, suggesting nonspecific chronic bladder voiding dysfunction. 8. Moderate colorectal stool, suggesting constipation. 9. Incompletely healed posterior right ninth through twelfth rib fractures. 10. Aortic Atherosclerosis (ICD10-I70.0). Electronically Signed   By: Ilona Sorrel M.D.   On: 03/07/2020 13:03    Cardiac Studies   Cardiac catheterization, echocardiogram personally reviewed.  Echo does have poor images but appears to be severe aortic stenosis.  Patient Profile     69 y.o. male with severe aortic stenosis, TAVR evaluation, Dr. Burt Knack  Assessment & Plan    Severe aortic stenosis -TAVR evaluation Dr. Burt Knack. -Carotid duplex performed yesterday. -CT scan chest abdomen pelvis coronary aorta  performed yesterday -TEE ordered by Dr. Burt Knack to better assess aortic insufficiency and aortic valve anatomy. NPO past midnight. Orders have been placed. Does not appear to be on the schedule as of yet. I have sent a message to Trish to help facilitate tomorrow.  Acute on chronic diastolic heart failure -Lasix was held after increasing creatinine noted.  Now back down.  Feeling better.  Bradycardia -Atenolol on hold.  Essential hypertension -Holding atenolol and Tekturna  Once TAVR evaluation done, should be able to be discharged.      For questions or updates, please contact [place] Please consult [email or web address] for contact info under        Signed, Candee Furbish, MD  03/08/2020, 8:34 AM

## 2020-03-08 NOTE — Progress Notes (Signed)
Pt has refused vcpap att his time.  RT will continue to monitor.

## 2020-03-08 NOTE — Progress Notes (Signed)
[facility]  PROGRESS NOTE  Jordan [name] CHY:[id number] DOB: 1951/06/20 DOA: 03/03/2020 PCP: [name], MD   Brief HPI:   69 year old male with history of COPD, diabetes mellitus type 2, hypertension, obesity, OSA on CPAP presents with shortness of breath on exertion.  Chest x-ray showed bilateral edema, pleural effusion.  Patient was started on diuretics and admitted to hospitalist service.  Cardiology was consulted for severe aortic stenosis seen on echocardiogram.    Subjective   Patient seen and examined, breathing better.  Plan for TEE in a.m.   Assessment/Plan:     1. Severe aortic stenosis-conflicting evaluation for aortic stenosis as per cardiology, patient might need TEE for further evaluation.  In the meantime CTA cardiac TAVR protocol ordered, CT chest/abdomen/pelvis to evaluate anatomic suitability for TAVR access.  Cardiology following.  TEE scheduled for tomorrow morning. 2. Acute on chronic diastolic heart failure-Lasix on hold due to rising creatinine. 3. COPD-mild tachypnea, continue Pulmicort nebulization twice a day, DuoNeb nebulizer every 6 hours as needed. 4. Bradycardia-atenolol on hold. 5. Hypertension-blood pressure is now mildly elevated, blood pressure medications will be restarted as per cardiology discretion.   6. Sleep apnea-continue CPAP at bedtime 7. Depression-continue venlafaxine     COVID-19 Labs  No results for input(s): DDIMER, FERRITIN, LDH, CRP in the last 72 hours.  Lab Results  Component Value Date   [place] NEGATIVE 03/03/2020     Scheduled medications:   . aspirin EC  81 mg Oral Daily  . atorvastatin  80 mg Oral QPC supper  . budesonide (PULMICORT) nebulizer solution  0.25 mg Nebulization BID  . enoxaparin (LOVENOX) injection  40 mg Subcutaneous Q24H  . feeding supplement (ENSURE ENLIVE)  237 mL Oral BID BM  . folic acid  1 mg Oral Daily  . hydrocerin   Topical TID  . influenza vaccine adjuvanted  0.5 mL  Intramuscular Tomorrow-1000  . multivitamin with minerals  1 tablet Oral Daily  . pantoprazole  40 mg Oral Daily  . sodium chloride flush  3 mL Intravenous Q12H  . sodium chloride flush  3 mL Intravenous Q12H  . venlafaxine XR  150 mg Oral QPC supper         CBG: Recent Labs  Lab 03/07/20 0802 03/07/20 1134 03/07/20 1644 03/07/20 2231 03/08/20 0728  GLUCAP 77 121* 91 88 72    SpO2: 98 % O2 Flow Rate (L/min): 2 L/min FiO2 (%): 28 %    CBC: Recent Labs  Lab 03/03/20 1600 03/03/20 1600 03/05/20 0456 03/05/20 0456 03/06/20 0508 03/06/20 1224 03/06/20 1225 03/07/20 0458 03/08/20 0751  WBC 7.6  --  10.6*  --  7.4  --   --  6.8 7.5  NEUTROABS 5.5  --   --   --   --   --   --   --   --   HGB 13.4   < > 12.0*   < > 12.1* 11.6* 12.9* 12.3* 11.7*  HCT 44.2   < > 37.5*   < > 38.6* 34.0* 38.0* 39.1 37.4*  MCV 95.9  --  92.8  --  93.0  --   --  94.9 94.7  PLT 169  --  189  --  159  --   --  145* 144*   < > = values in this interval not displayed.    Basic Metabolic Panel: Recent Labs  Lab 03/05/20 0456 03/05/20 0456 03/05/20 1225 03/05/20 1225 03/06/20 0508 03/06/20 1224 03/06/20 1225 03/07/20 0458 03/08/20  0751  NA 137   < > 137   < > 137 142 141 139 135  K 4.0   < > 4.6   < > 4.5 3.6 3.9 4.3 4.7  CL 97*  --  95*  --  96*  --   --  102 100  CO2 32  --  30  --  33*  --   --  28 29  GLUCOSE 90  --  73  --  77  --   --  83 75  BUN 34*  --  35*  --  32*  --   --  26* 20  CREATININE 1.64*  --  1.57*  --  1.38*  --   --  0.97 0.97  CALCIUM 7.7*  --  7.8*  --  7.8*  --   --  8.2* 8.3*  MG 1.8  --   --   --   --   --   --   --   --    < > = values in this interval not displayed.     Liver Function Tests: Recent Labs  Lab 03/03/20 1600  AST 25  ALT 13  ALKPHOS 151*  BILITOT 1.3*  PROT 5.6*  ALBUMIN 3.1*     Antibiotics: Anti-infectives (From admission, onward)   None       DVT prophylaxis: Lovenox  Code Status: Full code  Family  Communication: No family at bedside    Status is: Inpatient  Dispo: The patient is from: Home              Anticipated d/c is to: Home              Anticipated d/c date is: 03/09/2020              Patient currently not medically stable for discharge  Barrier to discharge-currently undergoing preop work-up for TAVR  Pressure Injury 03/14/17 Stage II -  Partial thickness loss of dermis presenting as a shallow open ulcer with a red, pink wound bed without slough. (Active)  03/14/17   Location: Buttocks  Location Orientation: Right;Left  Staging: Stage II -  Partial thickness loss of dermis presenting as a shallow open ulcer with a red, pink wound bed without slough.  Wound Description (Comments):   Present on Admission: Yes    Consultants:  Cardiology  Procedures:  Echocardiogram   Objective   Vitals:   03/08/20 0450 03/08/20 0745 03/08/20 0913 03/08/20 1111  BP: (!) 147/54 (!) 149/54  (!) 125/59  Pulse: (!) 54 (!) 51  (!) 53  Resp: 18 20  18   Temp: (!) 97.4 F (36.3 C) (!) 97.4 F (36.3 C)  (!) 97.4 F (36.3 C)  TempSrc: Oral Oral  Oral  SpO2: 91% 100% 93% 98%  Weight: 76.1 kg     Height:        Intake/Output Summary (Last 24 hours) at 03/08/2020 1131 Last data filed at 03/08/2020 0600 Gross per 24 hour  Intake 483 ml  Output 1500 ml  Net -1017 ml    09/17 1901 - 09/19 0700 In: 1083 [P.O.:1080; I.V.:3] Out: 1951 [Urine:1951]  Filed Weights   03/06/20 0500 03/07/20 0625 03/08/20 0450  Weight: 84.8 kg 84 kg 76.1 kg    Physical Examination:  General-appears in no acute distress Heart-S1-S2, regular, no murmur auscultated Lungs-very minimal wheezing auscultated bilaterally. Abdomen-soft, nontender, no organomegaly Extremities-no edema in the lower extremities Neuro-alert, oriented x3, no focal deficit  noted   Data Reviewed:   Recent Results (from the past 240 hour(s))  SARS Coronavirus 2 by RT PCR (hospital order, performed in [facility] hospital  lab) Nasopharyngeal Nasopharyngeal Swab     Status: None   Collection Time: 03/03/20  4:03 PM   Specimen: Nasopharyngeal Swab  Result Value Ref Range Status   SARS Coronavirus 2 NEGATIVE NEGATIVE Final    Comment: (NOTE) SARS-CoV-2 target nucleic acids are NOT DETECTED.  The SARS-CoV-2 RNA is generally detectable in upper and lower respiratory specimens during the acute phase of infection. The lowest concentration of SARS-CoV-2 viral copies this assay can detect is 250 copies / mL. A negative result does not preclude SARS-CoV-2 infection and should not be used as the sole basis for treatment or other patient management decisions.  A negative result may occur with improper specimen collection / handling, submission of specimen other than nasopharyngeal swab, presence of viral mutation(s) within the areas targeted by this assay, and inadequate number of viral copies (<250 copies / mL). A negative result must be combined with clinical observations, patient history, and epidemiological information.  Fact Sheet for Patients:   StrictlyIdeas.no  Fact Sheet for Healthcare Providers: BankingDealers.co.za  This test is not yet approved or  cleared by the [place] FDA and has been authorized for detection and/or diagnosis of SARS-CoV-2 by FDA under an Emergency Use Authorization (EUA).  This EUA will remain in effect (meaning this test can be used) for the duration of the COVID-19 declaration under Section 564(b)(1) of the Act, 21 U.S.C. section 360bbb-3(b)(1), unless the authorization is terminated or revoked sooner.  Performed at [facility], [place] [address], [place], [place] [address]     No results for input(s): LIPASE, AMYLASE in the last 168 hours. No results for input(s): AMMONIA in the last 168 hours.  Cardiac Enzymes: No results for input(s): CKTOTAL, CKMB, CKMBINDEX, TROPONINI in the last 168 hours. BNP (last 3  results) Recent Labs    03/03/20 1600  BNP 2,454.0*    ProBNP (last 3 results) No results for input(s): PROBNP in the last 8760 hours.  Studies:  DG Orthopantogram  Result Date: 03/06/2020 CLINICAL DATA:  Poor dentition EXAM: ORTHOPANTOGRAM/PANORAMIC COMPARISON:  None. FINDINGS: Two Panorex views of the mandible are obtained. There is excessive patient motion on the study which severely limits evaluation. Only the lower central incisors, rib lower right lateral incisor, and lower right canine are well visualized and grossly intact. Erosive changes are seen within the bilateral upper premolars and right lower premolar. Other teeth are absent. No acute bony abnormalities. IMPRESSION: 1. Limited study as above, with erosive changes of the bilateral upper and right lower premolars. No bony abnormalities within the mandible and visualized facial bones. Electronically Signed   By: Randa Ngo M.D.   On: 03/06/2020 19:37   CARDIAC CATHETERIZATION  Result Date: 03/06/2020 1.  Nonobstructive coronary artery disease with mild nonobstructive plaquing in the RCA, wide patency of the left main, wide patency of the LAD, and mild to moderate mid circumflex stenosis 2.  Moderate aortic stenosis with mean gradient 26 mmHg and calculated aortic valve area 1.6 cm. 3.  Moderate pulmonary hypertension with mean PA pressure 34 mmHg, transpulmonary gradient 13 mmHg, PVR less than 2 Wood units  CT CORONARY MORPH W/CTA COR W/SCORE W/CA W/CM &/OR WO/CM  Result Date: 03/07/2020 EXAM: OVER-READ INTERPRETATION  CT CHEST The following report is an over-read performed by radiologist Dr. Salvatore Marvel of [facility]  on 03/07/2020. This over-read does not include interpretation of cardiac or coronary anatomy or pathology. The coronary CTA interpretation by the cardiologist is attached. COMPARISON:  None. FINDINGS: Please see the separate concurrent chest CT angiogram report for details. IMPRESSION: Please see the  separate concurrent chest CT angiogram report for details. Electronically Signed   By: Ilona Sorrel M.D.   On: 03/07/2020 12:38   CT ANGIO CHEST AORTA W/CM & OR WO/CM  Result Date: 03/07/2020 CLINICAL DATA:  Inpatient. Severe aortic stenosis. Acute on chronic heart failure. TAVR evaluation. EXAM: CT ANGIOGRAPHY CHEST, ABDOMEN AND PELVIS TECHNIQUE: Multidetector CT imaging through the chest, abdomen and pelvis was performed using the standard protocol during bolus administration of intravenous contrast. Multiplanar reconstructed images and MIPs were obtained and reviewed to evaluate the vascular anatomy. CONTRAST:  172mL OMNIPAQUE IOHEXOL 350 MG/ML SOLN COMPARISON:  03/04/2020 chest radiograph. FINDINGS: CTA CHEST FINDINGS Cardiovascular: Mild cardiomegaly. No significant pericardial effusion/thickening. Three-vessel coronary atherosclerosis. Atherosclerotic nonaneurysmal thoracic aorta, including ascending thoracic aortic atherosclerotic calcification. Diffuse thickening and coarse calcification of the aortic valve. Normal caliber pulmonary arteries. No central pulmonary emboli. Mediastinum/Nodes: No discrete thyroid nodules. Unremarkable esophagus. No axillary adenopathy. Mild right paratracheal adenopathy up to 1.4 cm (series 6/image 60). Mildly enlarged 1.1 cm subcarinal node (series 6/image 70). Mild AP window adenopathy up to 1.3 cm (series 6/image 59). Mild bilateral hilar adenopathy up to 1.4 cm on the right (series 6/image 66) and 1.2 cm on the left (series 6/image 65). Lungs/Pleura: No pneumothorax. Moderate dependent bilateral pleural effusions. Moderate dependent bilateral lower lobe passive atelectasis. Mild diffuse interlobular septal thickening. No acute consolidative airspace disease, lung masses or significant pulmonary nodules. Musculoskeletal: No aggressive appearing focal osseous lesions. Incompletely healed posterior right ninth through twelfth rib fractures. Moderate thoracic spondylosis.  CTA ABDOMEN AND PELVIS FINDINGS Hepatobiliary: Normal liver with no liver mass. Normal gallbladder with no radiopaque cholelithiasis. No biliary ductal dilatation. Pancreas: Normal, with no mass or duct dilation. Spleen: Normal size. No mass. Adrenals/Urinary Tract: Normal adrenals. No hydronephrosis. Simple 2.5 cm anterior upper left renal cyst. Additional subcentimeter hypodense renal cortical lesions scattered in both kidneys are too small to characterize and require no follow-up. Excreted contrast in the bladder. Mild diffuse bladder wall thickening and trabeculation with tiny bladder diverticula. Stomach/Bowel: Normal non-distended stomach. Normal caliber small bowel with no small bowel wall thickening. Normal appendix. Moderate colorectal stool. No large bowel wall thickening, diverticulosis or significant pericolonic fat stranding. Vascular/Lymphatic: Atherosclerotic nonaneurysmal abdominal aorta. Mild porta hepatis adenopathy up to 1.3 cm (series 6/image 119). No pelvic adenopathy. Reproductive: Top-normal size prostate. Other: No pneumoperitoneum, ascites or focal fluid collection. Musculoskeletal: No aggressive appearing focal osseous lesions. Marked lumbar spondylosis. VASCULAR MEASUREMENTS PERTINENT TO TAVR: AORTA: Minimal Aortic Diameter-17.3 x 17.2 mm Severity of Aortic Calcification-moderate RIGHT PELVIS: Right Common Iliac Artery - Minimal Diameter-8.1 x 6.7 mm Tortuosity-moderate Calcification-severe Right External Iliac Artery - Minimal Diameter-8.4 x 7.8 mm Tortuosity-moderate Calcification-mild-to-moderate Right Common Femoral Artery - Minimal Diameter-9.5 x 8.0 mm Tortuosity-mild Calcification-severe LEFT PELVIS: Left Common Iliac Artery - Minimal Diameter-8.3 x 7.2 mm Tortuosity-moderate Calcification-severe Left External Iliac Artery - Minimal Diameter-7.6 x 6.8 mm Tortuosity-mild-to-moderate Calcification-moderate Left Common Femoral Artery - Minimal Diameter-9.9 x 8.0 mm Tortuosity-mild  Calcification-moderate Review of the MIP images confirms the above findings. IMPRESSION: 1. Vascular findings and measurements pertinent to potential TAVR procedure, as detailed. 2. Diffuse thickening and coarse calcification of the aortic valve, compatible with the reported history of severe aortic stenosis. 3. Mild cardiomegaly. Three-vessel coronary  atherosclerosis. 4. Moderate dependent bilateral pleural effusions. 5. Mild diffuse interlobular septal thickening, compatible with mild pulmonary edema. 6. Mild porta hepatis, mild mediastinal and mild bilateral hilar lymphadenopathy, nonspecific, potentially reactive. Suggest follow-up CT chest, abdomen and pelvis with IV contrast in 3 months. 7. Mild diffuse bladder wall thickening and trabeculation with tiny bladder diverticula, suggesting nonspecific chronic bladder voiding dysfunction. 8. Moderate colorectal stool, suggesting constipation. 9. Incompletely healed posterior right ninth through twelfth rib fractures. 10. Aortic Atherosclerosis (ICD10-I70.0). Electronically Signed   By: Ilona Sorrel M.D.   On: 03/07/2020 13:03   VAS US CAROTID  Result Date: 03/08/2020 Carotid Arterial Duplex Study Risk Factors:      Hypertension, Diabetes. Other Factors:     Aortic stenosis, pre-TAVR. Limitations        Today's exam was limited due to facial hair, body habitus and                    the patient's respiratory variation. Comparison Study:  No prior study on file Performing Technologist: Sharion Dove RVS  Examination Guidelines: A complete evaluation includes B-mode imaging, spectral Doppler, color Doppler, and power Doppler as needed of all accessible portions of each vessel. Bilateral testing is considered an integral part of a complete examination. Limited examinations for reoccurring indications may be performed as noted.  Right Carotid Findings: +----------+--------+--------+--------+------------------+------------------+           PSV cm/sEDV  cm/sStenosisPlaque DescriptionComments           +----------+--------+--------+--------+------------------+------------------+ CCA Prox  63      9                                 intimal thickening +----------+--------+--------+--------+------------------+------------------+ CCA Distal63      7                                 intimal thickening +----------+--------+--------+--------+------------------+------------------+ ICA Prox  68      8               heterogenous                         +----------+--------+--------+--------+------------------+------------------+ ICA Distal89      14                                tortuous           +----------+--------+--------+--------+------------------+------------------+ ECA       47      3                                                    +----------+--------+--------+--------+------------------+------------------+ +----------+--------+-------+--------+-------------------+           PSV cm/sEDV cmsDescribeArm Pressure (mmHG) +----------+--------+-------+--------+-------------------+ [id number]                                         +----------+--------+-------+--------+-------------------+ +---------+--------+--+--------+--+ VertebralPSV cm/s51EDV cm/s14 +---------+--------+--+--------+--+  Left Carotid Findings: +----------+--------+--------+--------+------------------+------------------+           PSV cm/sEDV cm/sStenosisPlaque DescriptionComments           +----------+--------+--------+--------+------------------+------------------+  CCA Prox  89      17                                intimal thickening +----------+--------+--------+--------+------------------+------------------+ CCA Distal78      14                                intimal thickening +----------+--------+--------+--------+------------------+------------------+ ICA Prox  105     27              heterogenous                          +----------+--------+--------+--------+------------------+------------------+ ICA Distal77      18                                tortuous           +----------+--------+--------+--------+------------------+------------------+ ECA       138     12                                                   +----------+--------+--------+--------+------------------+------------------+ +----------+--------+--------+--------+-------------------+           PSV cm/sEDV cm/sDescribeArm Pressure (mmHG) +----------+--------+--------+--------+-------------------+ [id number]                                          +----------+--------+--------+--------+-------------------+ +---------+--------+--+--------+--+ VertebralPSV cm/s51EDV cm/s12 +---------+--------+--+--------+--+   Summary: Right Carotid: The extracranial vessels were near-normal with only minimal wall                thickening or plaque. Left Carotid: The extracranial vessels were near-normal with only minimal wall               thickening or plaque. Vertebrals:  Bilateral vertebral arteries demonstrate antegrade flow. Subclavians: Normal flow hemodynamics were seen in bilateral subclavian              arteries. *See table(s) above for measurements and observations.  Electronically signed by Servando Snare MD on 03/08/2020 at 11:04:14 AM.    Final    CT Angio Abd/Pel w/ and/or w/o  Result Date: 03/07/2020 CLINICAL DATA:  Inpatient. Severe aortic stenosis. Acute on chronic heart failure. TAVR evaluation. EXAM: CT ANGIOGRAPHY CHEST, ABDOMEN AND PELVIS TECHNIQUE: Multidetector CT imaging through the chest, abdomen and pelvis was performed using the standard protocol during bolus administration of intravenous contrast. Multiplanar reconstructed images and MIPs were obtained and reviewed to evaluate the vascular anatomy. CONTRAST:  150mL OMNIPAQUE IOHEXOL 350 MG/ML SOLN COMPARISON:  03/04/2020 chest radiograph. FINDINGS: CTA CHEST  FINDINGS Cardiovascular: Mild cardiomegaly. No significant pericardial effusion/thickening. Three-vessel coronary atherosclerosis. Atherosclerotic nonaneurysmal thoracic aorta, including ascending thoracic aortic atherosclerotic calcification. Diffuse thickening and coarse calcification of the aortic valve. Normal caliber pulmonary arteries. No central pulmonary emboli. Mediastinum/Nodes: No discrete thyroid nodules. Unremarkable esophagus. No axillary adenopathy. Mild right paratracheal adenopathy up to 1.4 cm (series 6/image 60). Mildly enlarged 1.1 cm subcarinal node (series 6/image 70). Mild AP window adenopathy up to 1.3 cm (series 6/image 59). Mild bilateral hilar adenopathy up  to 1.4 cm on the right (series 6/image 66) and 1.2 cm on the left (series 6/image 65). Lungs/Pleura: No pneumothorax. Moderate dependent bilateral pleural effusions. Moderate dependent bilateral lower lobe passive atelectasis. Mild diffuse interlobular septal thickening. No acute consolidative airspace disease, lung masses or significant pulmonary nodules. Musculoskeletal: No aggressive appearing focal osseous lesions. Incompletely healed posterior right ninth through twelfth rib fractures. Moderate thoracic spondylosis. CTA ABDOMEN AND PELVIS FINDINGS Hepatobiliary: Normal liver with no liver mass. Normal gallbladder with no radiopaque cholelithiasis. No biliary ductal dilatation. Pancreas: Normal, with no mass or duct dilation. Spleen: Normal size. No mass. Adrenals/Urinary Tract: Normal adrenals. No hydronephrosis. Simple 2.5 cm anterior upper left renal cyst. Additional subcentimeter hypodense renal cortical lesions scattered in both kidneys are too small to characterize and require no follow-up. Excreted contrast in the bladder. Mild diffuse bladder wall thickening and trabeculation with tiny bladder diverticula. Stomach/Bowel: Normal non-distended stomach. Normal caliber small bowel with no small bowel wall thickening. Normal  appendix. Moderate colorectal stool. No large bowel wall thickening, diverticulosis or significant pericolonic fat stranding. Vascular/Lymphatic: Atherosclerotic nonaneurysmal abdominal aorta. Mild porta hepatis adenopathy up to 1.3 cm (series 6/image 119). No pelvic adenopathy. Reproductive: Top-normal size prostate. Other: No pneumoperitoneum, ascites or focal fluid collection. Musculoskeletal: No aggressive appearing focal osseous lesions. Marked lumbar spondylosis. VASCULAR MEASUREMENTS PERTINENT TO TAVR: AORTA: Minimal Aortic Diameter-17.3 x 17.2 mm Severity of Aortic Calcification-moderate RIGHT PELVIS: Right Common Iliac Artery - Minimal Diameter-8.1 x 6.7 mm Tortuosity-moderate Calcification-severe Right External Iliac Artery - Minimal Diameter-8.4 x 7.8 mm Tortuosity-moderate Calcification-mild-to-moderate Right Common Femoral Artery - Minimal Diameter-9.5 x 8.0 mm Tortuosity-mild Calcification-severe LEFT PELVIS: Left Common Iliac Artery - Minimal Diameter-8.3 x 7.2 mm Tortuosity-moderate Calcification-severe Left External Iliac Artery - Minimal Diameter-7.6 x 6.8 mm Tortuosity-mild-to-moderate Calcification-moderate Left Common Femoral Artery - Minimal Diameter-9.9 x 8.0 mm Tortuosity-mild Calcification-moderate Review of the MIP images confirms the above findings. IMPRESSION: 1. Vascular findings and measurements pertinent to potential TAVR procedure, as detailed. 2. Diffuse thickening and coarse calcification of the aortic valve, compatible with the reported history of severe aortic stenosis. 3. Mild cardiomegaly. Three-vessel coronary atherosclerosis. 4. Moderate dependent bilateral pleural effusions. 5. Mild diffuse interlobular septal thickening, compatible with mild pulmonary edema. 6. Mild porta hepatis, mild mediastinal and mild bilateral hilar lymphadenopathy, nonspecific, potentially reactive. Suggest follow-up CT chest, abdomen and pelvis with IV contrast in 3 months. 7. Mild diffuse bladder  wall thickening and trabeculation with tiny bladder diverticula, suggesting nonspecific chronic bladder voiding dysfunction. 8. Moderate colorectal stool, suggesting constipation. 9. Incompletely healed posterior right ninth through twelfth rib fractures. 10. Aortic Atherosclerosis (ICD10-I70.0). Electronically Signed   By: Ilona Sorrel M.D.   On: 03/07/2020 13:03       [place]   [facility] If 7PM-7AM, please contact night-coverage at [email or web address], Office  [phone]   03/08/2020, 11:31 AM  LOS: 5 days

## 2020-03-09 ENCOUNTER — Inpatient Hospital Stay (HOSPITAL_COMMUNITY): Payer: Medicare PPO

## 2020-03-09 ENCOUNTER — Encounter (HOSPITAL_COMMUNITY)
Admission: EM | Disposition: A | Discharge status: Home Health | Payer: Self-pay | Source: Home / Self Care | Attending: Family Medicine

## 2020-03-09 ENCOUNTER — Inpatient Hospital Stay (HOSPITAL_COMMUNITY): Payer: Medicare PPO | Admitting: Certified Registered"

## 2020-03-09 ENCOUNTER — Encounter (HOSPITAL_COMMUNITY): Payer: Self-pay | Admitting: Internal Medicine

## 2020-03-09 DIAGNOSIS — I350 Nonrheumatic aortic (valve) stenosis: Secondary | ICD-10-CM

## 2020-03-09 DIAGNOSIS — I351 Nonrheumatic aortic (valve) insufficiency: Secondary | ICD-10-CM

## 2020-03-09 HISTORY — PX: TEE WITHOUT CARDIOVERSION: SHX5443

## 2020-03-09 LAB — CBC
HCT: 37.6 % — ABNORMAL LOW (ref 39.0–52.0)
Hemoglobin: 11.6 g/dL — ABNORMAL LOW (ref 13.0–17.0)
MCH: 28.8 pg (ref 26.0–34.0)
MCHC: 30.9 g/dL (ref 30.0–36.0)
MCV: 93.3 fL (ref 80.0–100.0)
Platelets: 157 10*3/uL (ref 150–400)
RBC: 4.03 MIL/uL — ABNORMAL LOW (ref 4.22–5.81)
RDW: 14.6 % (ref 11.5–15.5)
WBC: 7 10*3/uL (ref 4.0–10.5)
nRBC: 0 % (ref 0.0–0.2)

## 2020-03-09 LAB — GLUCOSE, CAPILLARY
Glucose-Capillary: 82 mg/dL (ref 70–99)
Glucose-Capillary: 104 mg/dL — ABNORMAL HIGH (ref 70–99)
Glucose-Capillary: 67 mg/dL — ABNORMAL LOW (ref 70–99)
Glucose-Capillary: 87 mg/dL (ref 70–99)
Glucose-Capillary: 53 mg/dL — ABNORMAL LOW (ref 70–99)
Glucose-Capillary: 75 mg/dL (ref 70–99)

## 2020-03-09 LAB — BASIC METABOLIC PANEL
Anion gap: 8 (ref 5–15)
BUN: 21 mg/dL (ref 8–23)
CO2: 28 mmol/L (ref 22–32)
Calcium: 8 mg/dL — ABNORMAL LOW (ref 8.9–10.3)
Chloride: 100 mmol/L (ref 98–111)
Creatinine, Ser: 1.1 mg/dL (ref 0.61–1.24)
GFR calc Af Amer: >60 mL/min (ref >60)
GFR calc non Af Amer: >60 mL/min (ref >60)
Glucose, Bld: 76 mg/dL (ref 70–99)
Potassium: 4.9 mmol/L (ref 3.5–5.1)
Sodium: 136 mmol/L (ref 135–145)

## 2020-03-09 SURGERY — ECHOCARDIOGRAM, TRANSESOPHAGEAL
Anesthesia: Monitor Anesthesia Care

## 2020-03-09 MED ORDER — FUROSEMIDE 40 MG PO TABS
40.0000 mg | ORAL_TABLET | Freq: Every day | ORAL | Status: DC
  Administered 2020-03-09 – 2020-03-12 (×4): 40 mg via ORAL
  Filled 2020-03-09 (×4): qty 1

## 2020-03-09 MED ORDER — PROPOFOL 500 MG/50ML IV EMUL
INTRAVENOUS | Status: DC | PRN
  Administered 2020-03-09: 75 ug/kg/min via INTRAVENOUS

## 2020-03-09 MED ORDER — LIDOCAINE VISCOUS HCL 2 % MT SOLN
OROMUCOSAL | Status: AC
  Filled 2020-03-09: qty 15

## 2020-03-09 MED ORDER — LIDOCAINE VISCOUS HCL 2 % MT SOLN
OROMUCOSAL | Status: DC | PRN
  Administered 2020-03-09: 1 via OROMUCOSAL

## 2020-03-09 MED ORDER — DEXTROSE 50 % IV SOLN
INTRAVENOUS | Status: AC
  Administered 2020-03-09: 50 mL
  Filled 2020-03-09: qty 50

## 2020-03-09 MED ORDER — PROPOFOL 10 MG/ML IV BOLUS
INTRAVENOUS | Status: DC | PRN
  Administered 2020-03-09 (×4): 20 mg via INTRAVENOUS

## 2020-03-09 MED ORDER — PHENYLEPHRINE 40 MCG/ML (10ML) SYRINGE FOR IV PUSH (FOR BLOOD PRESSURE SUPPORT)
PREFILLED_SYRINGE | INTRAVENOUS | Status: DC | PRN
  Administered 2020-03-09 (×6): 80 ug via INTRAVENOUS

## 2020-03-09 MED ORDER — FENTANYL CITRATE (PF) 100 MCG/2ML IJ SOLN
INTRAMUSCULAR | Status: DC | PRN
Start: 2020-03-09 — End: 2020-03-09
  Administered 2020-03-09: 25 ug via INTRAVENOUS

## 2020-03-09 MED ORDER — LACTATED RINGERS IV SOLN
INTRAVENOUS | Status: DC
  Administered 2020-03-09: 1000 mL via INTRAVENOUS

## 2020-03-09 MED ORDER — EPHEDRINE SULFATE-NACL 50-0.9 MG/10ML-% IV SOSY
PREFILLED_SYRINGE | INTRAVENOUS | Status: DC | PRN
  Administered 2020-03-09: 5 mg via INTRAVENOUS

## 2020-03-09 NOTE — Progress Notes (Signed)
ReDS Clip Diuretic Study Pt study # [id number]  Your patient has been enrolled in the ReDS Clip Diuretic Study  No uop charted - one unmeasured occurrence. Wt charted as up (2 lb from 9/18). Scr stable at 0.97.   Changes to prescribed diuretics recommended:  No adjustments to adding diuretics at this time - will continue to monitor. On lasix 40 mg PO prn PTA.   Provider contacted: Cadence Furth, PA   REDS Clip  READING= 26%  CHEST RULER = 33 Clip Station = D   Orthodema score = 0 Signs/Symptoms Score   Mild edema, no orthopnea 0 No congestion  Moderate edema, no orthopnea 1 Low-grade orthodema/congestion  Severe edema OR orthopnea 2   Moderate edema and orthopnea 3 High-grade orthodema/congestion  Severe edema AND orthopnea 4    Antonietta Jewel, PharmD, BCCCP Clinical Pharmacist  Phone: [phone] 03/09/2020 7:14 AM  Please check AMION for all [place] phone numbers After 10:00 PM, call [place]

## 2020-03-09 NOTE — Transfer of Care (Signed)
Immediate Anesthesia Transfer of Care Note  Patient: Jordan [name]  Procedure(s) Performed: TRANSESOPHAGEAL ECHOCARDIOGRAM (TEE) (N/A )  Patient Location: PACU  Anesthesia Type:General  Level of Consciousness: drowsy and patient cooperative  Airway & Oxygen Therapy: Patient Spontanous Breathing and Patient connected to nasal cannula oxygen  Post-op Assessment: Report given to RN and Post -op Vital signs reviewed and stable  Post vital signs: Reviewed and stable  Last Vitals:  Vitals Value Taken Time  BP    Temp    Pulse    Resp    SpO2      Last Pain:  Vitals:   03/09/20 1326  TempSrc:   PainSc: 0-No pain      Patients Stated Pain Goal: 0 (78/97/84 7841)  Complications: No complications documented.

## 2020-03-09 NOTE — Interval H&P Note (Signed)
History and Physical Interval Note:  03/09/2020 12:42 PM  Jordan [name]  has presented today for surgery, with the diagnosis of assess aortic valve.  The various methods of treatment have been discussed with the patient and family. After consideration of risks, benefits and other options for treatment, the patient has consented to  Procedure(s): TRANSESOPHAGEAL ECHOCARDIOGRAM (TEE) (N/A) as a surgical intervention.  The patient's history has been reviewed, patient examined, no change in status, stable for surgery.  I have reviewed the patient's chart and labs.  Questions were answered to the patient's satisfaction.     [name]

## 2020-03-09 NOTE — Progress Notes (Signed)
Physical Therapy Treatment Patient Details Name: Jordan [name] MRN: [id number] DOB: 01-05-1951 Today's Date: 03/09/2020    History of Present Illness Pt is a 69 y/o male admited secondary to increased weakness and SOB. Thought to be from acute CHF. TAVR workup pending. PMH includes COPD, DM, HTN, and tobacco use.    PT Comments    Patient progressing well towards PT goals. Today, pt tolerated pre-TAVR assessment. Sp02 dropped to 83% on RA with initial ambulation, donned 3L/min 02 [place] and able to maintain Sp02 in high 90s for remainder of session and activity. Requires frequent rest breaks due to fatigue and SOB. HR ranged from 54-68 bpm with activity. Appropriate BP response to activity. Noted to have 2/4 DOE with 6 minute walk test but no rest breaks needed. Seated rest breaks needed prior to starting test, between the 46m walk and 6 minute walk test and prior to returning to room due to [place]. Instructed pt on how to lock/unlock brakes using rollator prior to testing. Will likely need supplemental 02 at home. Will continue to follow. See below for TAVR details.  PT TAVR Pre-Assessment  6 Minute Walk Test:   Total Distance Walked:1240 ft.    Did the pt need a rest break? No If yes, why? Pain:No; Fatigue:Yes; Dyspnea/O2 saturations: Yes Comments: Sp02 remained in mid-high 90s on 3L/min 02 [place]   Pre-Test Post-Test  BP 132/50 138/48  HR 55 bpm 67 bpm  O2 saturations (indicated RA or L/min ) 94% on RA 97% on 3L/min 02  Modified Borg Dyspnea Scale (0 none-10 maximal) 1 3  RPE (6 very light-10 very hard) 9 12  Comments: Initially Sp02 dropped to 83% on RA after ambulation and prior to beginning TAVR tests so donned 02 for rest of session.  5 Meter Walk Test:  Trial 1 13.72 seconds  Trial 2 16.48 seconds  Trial 3 15.19  seconds  3 Trial Average/Gait Speed 15.1 seconds/.109ft/sec (<1.8 ft/sec indicates high fall risk)  Comments: No seated rest breaks needed between trials. Very Slow  gait speed.  Clinical Frailty Scale (1 very fit - 9 terminally ill): 3-4 (</= 5/12 is considered frail)    Follow Up Recommendations  Home health PT;Supervision - Intermittent     Equipment Recommendations  Other (comment) (rollator)    Recommendations for Other Services       Precautions / Restrictions Precautions Precautions: Fall Precaution Comments: watch 02 Restrictions Weight Bearing Restrictions: No    Mobility  Bed Mobility               General bed mobility comments: Sitting in chair upon PT arrival.  Transfers Overall transfer level: Needs assistance Equipment used: 4-wheeled walker Transfers: Sit to/from Stand Sit to Stand: Supervision         General transfer comment: Supervision for safety. Stood from chair x2, from rollator seat x4. Cues how to lock/unlock brakes using rollator.  Ambulation/Gait Ambulation/Gait assistance: Min guard Gait Distance (Feet): 1480 Feet (multiple seated rest breaks) Assistive device: 4-wheeled walker Gait Pattern/deviations: Step-through pattern;Decreased stride length;Trunk flexed;Decreased step length - right;Decreased step length - left Gait velocity: very slow Gait velocity interpretation: <1.31 ft/sec, indicative of household ambulator General Gait Details: very slow, mostly steady gait using rollator, fatigues. Needs rest breaks after each task/activity. See TAVR assessment.   Stairs             Wheelchair Mobility    Modified Rankin (Stroke Patients Only)       Balance Overall balance  assessment: Needs assistance Sitting-balance support: Feet supported;No upper extremity supported Sitting balance-Leahy Scale: Good     Standing balance support: During functional activity Standing balance-Leahy Scale: Fair Standing balance comment: Reliant on at least 1 UE support                             Cognition Arousal/Alertness: Awake/alert Behavior During Therapy: WFL for tasks  assessed/performed Overall Cognitive Status: Within Functional Limits for tasks assessed                                        Exercises      General Comments General comments (skin integrity, edema, etc.): Sp02 83% on RA with walking, donned 3L/min 02 [place] and able to maintain Sp02 in high 90s with activity. HR 54-68 bpm.      Pertinent Vitals/Pain Pain Assessment: No/denies pain    Home Living                      Prior Function            PT Goals (current goals can now be found in the care plan section) Progress towards PT goals: Progressing toward goals    Frequency    Min 3X/week      PT Plan Current plan remains appropriate    Co-evaluation              AM-PAC PT "6 Clicks" Mobility   Outcome Measure  Help needed turning from your back to your side while in a flat bed without using bedrails?: None Help needed moving from lying on your back to sitting on the side of a flat bed without using bedrails?: None Help needed moving to and from a bed to a chair (including a wheelchair)?: None Help needed standing up from a chair using your arms (e.g., wheelchair or bedside chair)?: A Little Help needed to walk in hospital room?: A Little Help needed climbing 3-5 steps with a railing? : A Lot 6 Click Score: 20    End of Session Equipment Utilized During Treatment: Gait belt Activity Tolerance: Patient tolerated treatment well Patient left: in chair;with call bell/phone within reach;with chair alarm set Nurse Communication: Mobility status PT Visit Diagnosis: Muscle weakness (generalized) (M62.81);Difficulty in walking, not elsewhere classified (R26.2)     Time: 4825-0037 PT Time Calculation (min) (ACUTE ONLY): 47 min  Charges:  $Gait Training: 8-22 mins $Therapeutic Exercise: 8-22 mins $Physical Performance Test: 8-22 mins                     Marisa Severin, PT, DPT Acute Rehabilitation Services Pager [phone] Office  [place] 03/09/2020, 10:52 AM

## 2020-03-09 NOTE — Progress Notes (Signed)
[facility]  PROGRESS NOTE  Jordan [name]:[id number] DOB: 1951/05/03 DOA: 03/03/2020 PCP: [name], MD   Brief HPI:   69 year old male with history of COPD, diabetes mellitus type 2, hypertension, obesity, OSA on CPAP presents with shortness of breath on exertion.  Chest x-ray showed bilateral edema, pleural effusion.  Patient was started on diuretics and admitted to hospitalist service.  Cardiology was consulted for severe aortic stenosis seen on echocardiogram.    Subjective   Patient seen and examined, awaiting for TEE today.   Assessment/Plan:     1. Severe aortic stenosis-conflicting evaluation for aortic stenosis as per cardiology, patient will need TEE for further evaluation.  In the meantime CTA cardiac TAVR protocol ordered, CT chest/abdomen/pelvis to evaluate anatomic suitability for TAVR access.  Cardiology following.  TEE scheduled for today.  Follow cardiology recommendations. 2. Acute on chronic diastolic heart failure-Lasix on hold due to rising creatinine. 3. COPD-mild tachypnea, continue Pulmicort nebulization twice a day, DuoNeb nebulizer every 6 hours as needed. 4. Bradycardia-atenolol on hold. 5. Hypertension-blood pressure is now mildly elevated, blood pressure medications will be restarted as per cardiology discretion.   6. Sleep apnea-continue CPAP at bedtime 7. Depression-continue venlafaxine     COVID-19 Labs  No results for input(s): DDIMER, FERRITIN, LDH, CRP in the last 72 hours.  Lab Results  Component Value Date   [place] NEGATIVE 03/03/2020     Scheduled medications:   . [MAR Hold] aspirin EC  81 mg Oral Daily  . [MAR Hold] atorvastatin  80 mg Oral QPC supper  . [MAR Hold] budesonide (PULMICORT) nebulizer solution  0.25 mg Nebulization BID  . [MAR Hold] enoxaparin (LOVENOX) injection  40 mg Subcutaneous Q24H  . [MAR Hold] feeding supplement (ENSURE ENLIVE)  237 mL Oral BID BM  . [MAR Hold] folic acid  1 mg Oral Daily   . [MAR Hold] furosemide  40 mg Oral Daily  . [MAR Hold] hydrocerin   Topical TID  . influenza vaccine adjuvanted  0.5 mL Intramuscular Tomorrow-1000  . [MAR Hold] multivitamin with minerals  1 tablet Oral Daily  . [MAR Hold] pantoprazole  40 mg Oral Daily  . [MAR Hold] sodium chloride flush  3 mL Intravenous Q12H  . [MAR Hold] sodium chloride flush  3 mL Intravenous Q12H  . [facility] Hold] venlafaxine XR  150 mg Oral QPC supper         CBG: Recent Labs  Lab 03/08/20 1653 03/08/20 2119 03/09/20 0742 03/09/20 1138 03/09/20 1206  GLUCAP 102* 124* 82 67* 104*    SpO2: 91 % O2 Flow Rate (L/min): 2 L/min FiO2 (%): 28 %    CBC: Recent Labs  Lab 03/03/20 1600 03/03/20 1600 03/05/20 0456 03/05/20 0456 03/06/20 0508 03/06/20 0508 03/06/20 1224 03/06/20 1225 03/07/20 0458 03/08/20 0751 03/09/20 0255  WBC 7.6   < > 10.6*  --  7.4  --   --   --  6.8 7.5 7.0  NEUTROABS 5.5  --   --   --   --   --   --   --   --   --   --   HGB 13.4   < > 12.0*   < > 12.1*   < > 11.6* 12.9* 12.3* 11.7* 11.6*  HCT 44.2   < > 37.5*   < > 38.6*   < > 34.0* 38.0* 39.1 37.4* 37.6*  MCV 95.9   < > 92.8  --  93.0  --   --   --  94.9 94.7 93.3  PLT 169   < > 189  --  159  --   --   --  145* 144* 157   < > = values in this interval not displayed.    Basic Metabolic Panel: Recent Labs  Lab 03/05/20 0456 03/05/20 0456 03/05/20 1225 03/05/20 1225 03/06/20 0508 03/06/20 0508 03/06/20 1224 03/06/20 1225 03/07/20 0458 03/08/20 0751 03/09/20 0255  NA 137   < > 137   < > 137   < > 142 141 139 135 136  K 4.0   < > 4.6   < > 4.5   < > 3.6 3.9 4.3 4.7 4.9  CL 97*   < > 95*  --  96*  --   --   --  102 100 100  CO2 32   < > 30  --  33*  --   --   --  28 29 28   GLUCOSE 90   < > 73  --  77  --   --   --  83 75 76  BUN 34*   < > 35*  --  32*  --   --   --  26* 20 21  CREATININE 1.64*   < > 1.57*  --  1.38*  --   --   --  0.97 0.97 1.10  CALCIUM 7.7*   < > 7.8*  --  7.8*  --   --   --  8.2* 8.3* 8.0*   MG 1.8  --   --   --   --   --   --   --   --   --   --    < > = values in this interval not displayed.     Liver Function Tests: Recent Labs  Lab 03/03/20 1600  AST 25  ALT 13  ALKPHOS 151*  BILITOT 1.3*  PROT 5.6*  ALBUMIN 3.1*     Antibiotics: Anti-infectives (From admission, onward)   None       DVT prophylaxis: Lovenox  Code Status: Full code  Family Communication: No family at bedside    Status is: Inpatient  Dispo: The patient is from: Home              Anticipated d/c is to: Home              Anticipated d/c date is: 03/11/2020              Patient currently not medically stable for discharge  Barrier to discharge-currently undergoing preop work-up for TAVR  Pressure Injury 03/14/17 Stage II -  Partial thickness loss of dermis presenting as a shallow open ulcer with a red, pink wound bed without slough. (Active)  03/14/17   Location: Buttocks  Location Orientation: Right;Left  Staging: Stage II -  Partial thickness loss of dermis presenting as a shallow open ulcer with a red, pink wound bed without slough.  Wound Description (Comments):   Present on Admission: Yes    Consultants:  Cardiology  Procedures:  Echocardiogram   Objective   Vitals:   03/09/20 1326 03/09/20 1434 03/09/20 1440 03/09/20 1450  BP: (!) 157/36 (!) 119/40 (!) 113/33 (!) 113/31  Pulse: (!) 53 74 72 65  Resp: 14 16 13 11   Temp:  97.9 F (36.6 C)    TempSrc:  Oral    SpO2: 94% 98% 93% 91%  Weight: 85.1 kg     Height: 6\' 1"  (1.854  m)       Intake/Output Summary (Last 24 hours) at 03/09/2020 1501 Last data filed at 03/09/2020 1431 Gross per 24 hour  Intake 403 ml  Output 300 ml  Net 103 ml    09/18 1901 - 09/20 0700 In: 486 [P.O.:480; I.V.:6] Out: 900 [Urine:900]  Filed Weights   03/08/20 0450 03/09/20 0531 03/09/20 1326  Weight: 76.1 kg 85.1 kg 85.1 kg    Physical Examination:  General-appears in no acute distress Heart-S1-S2, regular, no murmur  auscultated Lungs-clear to auscultation bilaterally, no wheezing or crackles auscultated Abdomen-soft, nontender, no organomegaly Extremities-no edema in the lower extremities Neuro-alert, oriented x3, no focal deficit noted  Data Reviewed:   Recent Results (from the past 240 hour(s))  SARS Coronavirus 2 by RT PCR (hospital order, performed in [facility] hospital lab) Nasopharyngeal Nasopharyngeal Swab     Status: None   Collection Time: 03/03/20  4:03 PM   Specimen: Nasopharyngeal Swab  Result Value Ref Range Status   SARS Coronavirus 2 NEGATIVE NEGATIVE Final    Comment: (NOTE) SARS-CoV-2 target nucleic acids are NOT DETECTED.  The SARS-CoV-2 RNA is generally detectable in upper and lower respiratory specimens during the acute phase of infection. The lowest concentration of SARS-CoV-2 viral copies this assay can detect is 250 copies / mL. A negative result does not preclude SARS-CoV-2 infection and should not be used as the sole basis for treatment or other patient management decisions.  A negative result may occur with improper specimen collection / handling, submission of specimen other than nasopharyngeal swab, presence of viral mutation(s) within the areas targeted by this assay, and inadequate number of viral copies (<250 copies / mL). A negative result must be combined with clinical observations, patient history, and epidemiological information.  Fact Sheet for Patients:   StrictlyIdeas.no  Fact Sheet for Healthcare Providers: BankingDealers.co.za  This test is not yet approved or  cleared by the [place] FDA and has been authorized for detection and/or diagnosis of SARS-CoV-2 by FDA under an Emergency Use Authorization (EUA).  This EUA will remain in effect (meaning this test can be used) for the duration of the COVID-19 declaration under Section 564(b)(1) of the Act, 21 U.S.C. section 360bbb-3(b)(1), unless the  authorization is terminated or revoked sooner.  Performed at [facility], [place] [address] [place], [place] [address]     No results for input(s): LIPASE, AMYLASE in the last 168 hours. No results for input(s): AMMONIA in the last 168 hours.  Cardiac Enzymes: No results for input(s): CKTOTAL, CKMB, CKMBINDEX, TROPONINI in the last 168 hours. BNP (last 3 results) Recent Labs    03/03/20 1600  BNP 2,454.0*    ProBNP (last 3 results) No results for input(s): PROBNP in the last 8760 hours.  Studies:  No results found.     Oswald Hillock   [facility] If 7PM-7AM, please contact night-coverage at [email or web address], Office  [phone]   03/09/2020, 3:01 PM  LOS: 6 days

## 2020-03-09 NOTE — Anesthesia Preprocedure Evaluation (Signed)
Anesthesia Evaluation  Patient identified by MRN, date of birth, ID band Patient awake    Reviewed: Allergy & Precautions, NPO status , Patient's Chart, lab work & pertinent test results  Airway Mallampati: I       Dental no notable dental hx. (+) Teeth Intact   Pulmonary sleep apnea and Continuous Positive Airway Pressure Ventilation , Current Smoker and Patient abstained from smoking.,    Pulmonary exam normal        Cardiovascular hypertension, Pt. on home beta blockers +CHF  Normal cardiovascular exam + Systolic murmurs    Neuro/Psych  Headaches, PSYCHIATRIC DISORDERS Anxiety Depression    GI/Hepatic GERD  Medicated,  Endo/Other  diabetes, Type 2, Oral Hypoglycemic Agents  Renal/GU      Musculoskeletal   Abdominal Normal abdominal exam  (+)   Peds  Hematology  (+) Blood dyscrasia, anemia ,   Anesthesia Other Findings   1. Left ventricular ejection fraction, by estimation, is 55 to 60%. The  left ventricle has normal function. The left ventricle has no regional  wall motion abnormalities. Left ventricular diastolic function could not  be evaluated.  2. Right ventricular systolic function is normal. The right ventricular  size is normal.  3. Left atrial size was moderately dilated.  4. Right atrial size was mildly dilated.  5. Large pleural effusion in the left lateral region.  6. The mitral valve is normal in structure. No evidence of mitral valve  regurgitation.  7. The aortic valve is grossly normal. There is moderate calcification of  the aortic valve. There is moderate thickening of the aortic valve. Aortic  valve regurgitation is mild. Moderate to severe aortic valve stenosis.   FINDINGS Narrative 1. Nonobstructive coronary artery disease with mild nonobstructive  plaquing in the RCA, wide patency of the left main, wide patency of the  LAD, and mild to moderate mid circumflex stenosis 2.  Moderate aortic stenosis with mean gradient 26 mmHg and calculated  aortic valve area 1.6 cm. 3. Moderate pulmonary hypertension with mean PA pressure 34 mmHg,  transpulmonary gradient 13 mmHg, PVR less than 2 Wood units      Reproductive/Obstetrics                             Anesthesia Physical Anesthesia Plan  ASA: III  Anesthesia Plan: MAC   Post-op Pain Management:    Induction:   PONV Risk Score and Plan: 0 and Propofol infusion and Treatment may vary due to age or medical condition  Airway Management Planned: Natural Airway and Mask  Additional Equipment: TEE  Intra-op Plan:   Post-operative Plan:   Informed Consent: I have reviewed the patients History and Physical, chart, labs and discussed the procedure including the risks, benefits and alternatives for the proposed anesthesia with the patient or authorized representative who has indicated his/her understanding and acceptance.     Dental advisory given  Plan Discussed with: CRNA  Anesthesia Plan Comments:         Anesthesia Quick Evaluation

## 2020-03-09 NOTE — Progress Notes (Addendum)
Progress Note  Patient Name: Jordan [name] Date of Encounter: 03/09/2020  [facility] Cardiologist: Candee Furbish, MD   Subjective   I/Os not complete overnight, but he is -6.6 since admission. Overall feeling better. No chest pain. He is working with PT during interview. Creatinine normalized.   Inpatient Medications    Scheduled Meds: . aspirin EC  81 mg Oral Daily  . atorvastatin  80 mg Oral QPC supper  . budesonide (PULMICORT) nebulizer solution  0.25 mg Nebulization BID  . enoxaparin (LOVENOX) injection  40 mg Subcutaneous Q24H  . feeding supplement (ENSURE ENLIVE)  237 mL Oral BID BM  . folic acid  1 mg Oral Daily  . hydrocerin   Topical TID  . influenza vaccine adjuvanted  0.5 mL Intramuscular Tomorrow-1000  . multivitamin with minerals  1 tablet Oral Daily  . pantoprazole  40 mg Oral Daily  . sodium chloride flush  3 mL Intravenous Q12H  . sodium chloride flush  3 mL Intravenous Q12H  . venlafaxine XR  150 mg Oral QPC supper   Continuous Infusions: . sodium chloride     PRN Meds: sodium chloride, acetaminophen, butalbital-acetaminophen-caffeine, ipratropium-albuterol, LORazepam, ondansetron (ZOFRAN) IV, sodium chloride flush   Vital Signs    Vitals:   03/08/20 2121 03/09/20 0051 03/09/20 0531 03/09/20 0741  BP:  (!) 136/50 (!) 115/50   Pulse:   (!) 54 (!) 55  Resp:  14 15 18   Temp:  98.2 F (36.8 C) 98.1 F (36.7 C)   TempSrc:  Oral Oral   SpO2: 99% 93% 93% 96%  Weight:   85.1 kg   Height:        Intake/Output Summary (Last 24 hours) at 03/09/2020 0745 Last data filed at 03/08/2020 2250 Gross per 24 hour  Intake 3 ml  Output --  Net 3 ml   Last 3 Weights 03/09/2020 03/08/2020 03/07/2020  Weight (lbs) 187 lb 11.2 oz 167 lb 12.8 oz 185 lb 3 oz  Weight (kg) 85.14 kg 76.114 kg 84 kg      Telemetry    SR, PAC, 4 beats NSVT HR 50-60s - Personally Reviewed  ECG    No new - Personally Reviewed  Physical Exam   GEN: No acute distress.   Neck:  No JVD Cardiac: RRR, no murmurs, rubs, or gallops.  Respiratory: diminished at bases. GI: Soft, nontender, non-distended  MS: mild B/L edema; No deformity. Neuro:  Nonfocal  Psych: Normal affect   Labs    High Sensitivity Troponin:  No results for input(s): TROPONINIHS in the last 720 hours.    Chemistry Recent Labs  Lab 03/03/20 1600 03/04/20 0440 03/06/20 0508 03/06/20 1224 03/06/20 1225 03/07/20 0458 03/08/20 0751  NA 133*   < > 137   < > 141 139 135  K 6.1*   < > 4.5   < > 3.9 4.3 4.7  CL 97*   < > 96*  --   --  102 100  CO2 28   < > 33*  --   --  28 29  GLUCOSE 87   < > 77  --   --  83 75  BUN 25*   < > 32*  --   --  26* 20  CREATININE 1.10   < > 1.38*  --   --  0.97 0.97  CALCIUM 8.5*   < > 7.8*  --   --  8.2* 8.3*  PROT 5.6*  --   --   --   --   --   --  ALBUMIN 3.1*  --   --   --   --   --   --   AST 25  --   --   --   --   --   --   ALT 13  --   --   --   --   --   --   ALKPHOS 151*  --   --   --   --   --   --   BILITOT 1.3*  --   --   --   --   --   --   GFRNONAA >60   < > 52*  --   --  >60 >60  GFRAA >60   < > >60  --   --  >60 >60  ANIONGAP 8   < > 8  --   --  9 6   < > = values in this interval not displayed.     Hematology Recent Labs  Lab 03/07/20 0458 03/08/20 0751 03/09/20 0255  WBC 6.8 7.5 7.0  RBC 4.12* 3.95* 4.03*  HGB 12.3* 11.7* 11.6*  HCT 39.1 37.4* 37.6*  MCV 94.9 94.7 93.3  MCH 29.9 29.6 28.8  MCHC 31.5 31.3 30.9  RDW 15.0 14.7 14.6  PLT 145* 144* 157    BNP Recent Labs  Lab 03/03/20 1600  BNP 2,454.0*     DDimer No results for input(s): DDIMER in the last 168 hours.   Radiology    CT CORONARY MORPH W/CTA COR W/SCORE W/CA W/CM &/OR WO/CM  Result Date: 03/07/2020 EXAM: OVER-READ INTERPRETATION  CT CHEST The following report is an over-read performed by radiologist Dr. Salvatore Marvel of [facility], [place] on 03/07/2020. This over-read does not include interpretation of cardiac or coronary anatomy or pathology. The  coronary CTA interpretation by the cardiologist is attached. COMPARISON:  None. FINDINGS: Please see the separate concurrent chest CT angiogram report for details. IMPRESSION: Please see the separate concurrent chest CT angiogram report for details. Electronically Signed   By: Ilona Sorrel M.D.   On: 03/07/2020 12:38   CT ANGIO CHEST AORTA W/CM & OR WO/CM  Result Date: 03/07/2020 CLINICAL DATA:  Inpatient. Severe aortic stenosis. Acute on chronic heart failure. TAVR evaluation. EXAM: CT ANGIOGRAPHY CHEST, ABDOMEN AND PELVIS TECHNIQUE: Multidetector CT imaging through the chest, abdomen and pelvis was performed using the standard protocol during bolus administration of intravenous contrast. Multiplanar reconstructed images and MIPs were obtained and reviewed to evaluate the vascular anatomy. CONTRAST:  121mL OMNIPAQUE IOHEXOL 350 MG/ML SOLN COMPARISON:  03/04/2020 chest radiograph. FINDINGS: CTA CHEST FINDINGS Cardiovascular: Mild cardiomegaly. No significant pericardial effusion/thickening. Three-vessel coronary atherosclerosis. Atherosclerotic nonaneurysmal thoracic aorta, including ascending thoracic aortic atherosclerotic calcification. Diffuse thickening and coarse calcification of the aortic valve. Normal caliber pulmonary arteries. No central pulmonary emboli. Mediastinum/Nodes: No discrete thyroid nodules. Unremarkable esophagus. No axillary adenopathy. Mild right paratracheal adenopathy up to 1.4 cm (series 6/image 60). Mildly enlarged 1.1 cm subcarinal node (series 6/image 70). Mild AP window adenopathy up to 1.3 cm (series 6/image 59). Mild bilateral hilar adenopathy up to 1.4 cm on the right (series 6/image 66) and 1.2 cm on the left (series 6/image 65). Lungs/Pleura: No pneumothorax. Moderate dependent bilateral pleural effusions. Moderate dependent bilateral lower lobe passive atelectasis. Mild diffuse interlobular septal thickening. No acute consolidative airspace disease, lung masses or  significant pulmonary nodules. Musculoskeletal: No aggressive appearing focal osseous lesions. Incompletely healed posterior right ninth through twelfth rib fractures. Moderate thoracic spondylosis. CTA  ABDOMEN AND PELVIS FINDINGS Hepatobiliary: Normal liver with no liver mass. Normal gallbladder with no radiopaque cholelithiasis. No biliary ductal dilatation. Pancreas: Normal, with no mass or duct dilation. Spleen: Normal size. No mass. Adrenals/Urinary Tract: Normal adrenals. No hydronephrosis. Simple 2.5 cm anterior upper left renal cyst. Additional subcentimeter hypodense renal cortical lesions scattered in both kidneys are too small to characterize and require no follow-up. Excreted contrast in the bladder. Mild diffuse bladder wall thickening and trabeculation with tiny bladder diverticula. Stomach/Bowel: Normal non-distended stomach. Normal caliber small bowel with no small bowel wall thickening. Normal appendix. Moderate colorectal stool. No large bowel wall thickening, diverticulosis or significant pericolonic fat stranding. Vascular/Lymphatic: Atherosclerotic nonaneurysmal abdominal aorta. Mild porta hepatis adenopathy up to 1.3 cm (series 6/image 119). No pelvic adenopathy. Reproductive: Top-normal size prostate. Other: No pneumoperitoneum, ascites or focal fluid collection. Musculoskeletal: No aggressive appearing focal osseous lesions. Marked lumbar spondylosis. VASCULAR MEASUREMENTS PERTINENT TO TAVR: AORTA: Minimal Aortic Diameter-17.3 x 17.2 mm Severity of Aortic Calcification-moderate RIGHT PELVIS: Right Common Iliac Artery - Minimal Diameter-8.1 x 6.7 mm Tortuosity-moderate Calcification-severe Right External Iliac Artery - Minimal Diameter-8.4 x 7.8 mm Tortuosity-moderate Calcification-mild-to-moderate Right Common Femoral Artery - Minimal Diameter-9.5 x 8.0 mm Tortuosity-mild Calcification-severe LEFT PELVIS: Left Common Iliac Artery - Minimal Diameter-8.3 x 7.2 mm Tortuosity-moderate  Calcification-severe Left External Iliac Artery - Minimal Diameter-7.6 x 6.8 mm Tortuosity-mild-to-moderate Calcification-moderate Left Common Femoral Artery - Minimal Diameter-9.9 x 8.0 mm Tortuosity-mild Calcification-moderate Review of the MIP images confirms the above findings. IMPRESSION: 1. Vascular findings and measurements pertinent to potential TAVR procedure, as detailed. 2. Diffuse thickening and coarse calcification of the aortic valve, compatible with the reported history of severe aortic stenosis. 3. Mild cardiomegaly. Three-vessel coronary atherosclerosis. 4. Moderate dependent bilateral pleural effusions. 5. Mild diffuse interlobular septal thickening, compatible with mild pulmonary edema. 6. Mild porta hepatis, mild mediastinal and mild bilateral hilar lymphadenopathy, nonspecific, potentially reactive. Suggest follow-up CT chest, abdomen and pelvis with IV contrast in 3 months. 7. Mild diffuse bladder wall thickening and trabeculation with tiny bladder diverticula, suggesting nonspecific chronic bladder voiding dysfunction. 8. Moderate colorectal stool, suggesting constipation. 9. Incompletely healed posterior right ninth through twelfth rib fractures. 10. Aortic Atherosclerosis (ICD10-I70.0). Electronically Signed   By: Ilona Sorrel M.D.   On: 03/07/2020 13:03   VAS US CAROTID  Result Date: 03/08/2020 Carotid Arterial Duplex Study Risk Factors:      Hypertension, Diabetes. Other Factors:     Aortic stenosis, pre-TAVR. Limitations        Today's exam was limited due to facial hair, body habitus and                    the patient's respiratory variation. Comparison Study:  No prior study on file Performing Technologist: Sharion Dove RVS  Examination Guidelines: A complete evaluation includes B-mode imaging, spectral Doppler, color Doppler, and power Doppler as needed of all accessible portions of each vessel. Bilateral testing is considered an integral part of a complete examination. Limited  examinations for reoccurring indications may be performed as noted.  Right Carotid Findings: +----------+--------+--------+--------+------------------+------------------+           PSV cm/sEDV cm/sStenosisPlaque DescriptionComments           +----------+--------+--------+--------+------------------+------------------+ CCA Prox  63      9                                 intimal thickening +----------+--------+--------+--------+------------------+------------------+ CCA Distal63  7                                 intimal thickening +----------+--------+--------+--------+------------------+------------------+ ICA Prox  68      8               heterogenous                         +----------+--------+--------+--------+------------------+------------------+ ICA Distal89      14                                tortuous           +----------+--------+--------+--------+------------------+------------------+ ECA       47      3                                                    +----------+--------+--------+--------+------------------+------------------+ +----------+--------+-------+--------+-------------------+           PSV cm/sEDV cmsDescribeArm Pressure (mmHG) +----------+--------+-------+--------+-------------------+ [id number]                                         +----------+--------+-------+--------+-------------------+ +---------+--------+--+--------+--+ VertebralPSV cm/s51EDV cm/s14 +---------+--------+--+--------+--+  Left Carotid Findings: +----------+--------+--------+--------+------------------+------------------+           PSV cm/sEDV cm/sStenosisPlaque DescriptionComments           +----------+--------+--------+--------+------------------+------------------+ CCA Prox  89      17                                intimal thickening +----------+--------+--------+--------+------------------+------------------+ CCA Distal78      14                                 intimal thickening +----------+--------+--------+--------+------------------+------------------+ ICA Prox  105     27              heterogenous                         +----------+--------+--------+--------+------------------+------------------+ ICA Distal77      18                                tortuous           +----------+--------+--------+--------+------------------+------------------+ ECA       138     12                                                   +----------+--------+--------+--------+------------------+------------------+ +----------+--------+--------+--------+-------------------+           PSV cm/sEDV cm/sDescribeArm Pressure (mmHG) +----------+--------+--------+--------+-------------------+ [id number]                                          +----------+--------+--------+--------+-------------------+ +---------+--------+--+--------+--+  VertebralPSV cm/s51EDV cm/s12 +---------+--------+--+--------+--+   Summary: Right Carotid: The extracranial vessels were near-normal with only minimal wall                thickening or plaque. Left Carotid: The extracranial vessels were near-normal with only minimal wall               thickening or plaque. Vertebrals:  Bilateral vertebral arteries demonstrate antegrade flow. Subclavians: Normal flow hemodynamics were seen in bilateral subclavian              arteries. *See table(s) above for measurements and observations.  Electronically signed by Servando Snare MD on 03/08/2020 at 11:04:14 AM.    Final    CT Angio Abd/Pel w/ and/or w/o  Result Date: 03/07/2020 CLINICAL DATA:  Inpatient. Severe aortic stenosis. Acute on chronic heart failure. TAVR evaluation. EXAM: CT ANGIOGRAPHY CHEST, ABDOMEN AND PELVIS TECHNIQUE: Multidetector CT imaging through the chest, abdomen and pelvis was performed using the standard protocol during bolus administration of intravenous contrast. Multiplanar  reconstructed images and MIPs were obtained and reviewed to evaluate the vascular anatomy. CONTRAST:  175mL OMNIPAQUE IOHEXOL 350 MG/ML SOLN COMPARISON:  03/04/2020 chest radiograph. FINDINGS: CTA CHEST FINDINGS Cardiovascular: Mild cardiomegaly. No significant pericardial effusion/thickening. Three-vessel coronary atherosclerosis. Atherosclerotic nonaneurysmal thoracic aorta, including ascending thoracic aortic atherosclerotic calcification. Diffuse thickening and coarse calcification of the aortic valve. Normal caliber pulmonary arteries. No central pulmonary emboli. Mediastinum/Nodes: No discrete thyroid nodules. Unremarkable esophagus. No axillary adenopathy. Mild right paratracheal adenopathy up to 1.4 cm (series 6/image 60). Mildly enlarged 1.1 cm subcarinal node (series 6/image 70). Mild AP window adenopathy up to 1.3 cm (series 6/image 59). Mild bilateral hilar adenopathy up to 1.4 cm on the right (series 6/image 66) and 1.2 cm on the left (series 6/image 65). Lungs/Pleura: No pneumothorax. Moderate dependent bilateral pleural effusions. Moderate dependent bilateral lower lobe passive atelectasis. Mild diffuse interlobular septal thickening. No acute consolidative airspace disease, lung masses or significant pulmonary nodules. Musculoskeletal: No aggressive appearing focal osseous lesions. Incompletely healed posterior right ninth through twelfth rib fractures. Moderate thoracic spondylosis. CTA ABDOMEN AND PELVIS FINDINGS Hepatobiliary: Normal liver with no liver mass. Normal gallbladder with no radiopaque cholelithiasis. No biliary ductal dilatation. Pancreas: Normal, with no mass or duct dilation. Spleen: Normal size. No mass. Adrenals/Urinary Tract: Normal adrenals. No hydronephrosis. Simple 2.5 cm anterior upper left renal cyst. Additional subcentimeter hypodense renal cortical lesions scattered in both kidneys are too small to characterize and require no follow-up. Excreted contrast in the bladder.  Mild diffuse bladder wall thickening and trabeculation with tiny bladder diverticula. Stomach/Bowel: Normal non-distended stomach. Normal caliber small bowel with no small bowel wall thickening. Normal appendix. Moderate colorectal stool. No large bowel wall thickening, diverticulosis or significant pericolonic fat stranding. Vascular/Lymphatic: Atherosclerotic nonaneurysmal abdominal aorta. Mild porta hepatis adenopathy up to 1.3 cm (series 6/image 119). No pelvic adenopathy. Reproductive: Top-normal size prostate. Other: No pneumoperitoneum, ascites or focal fluid collection. Musculoskeletal: No aggressive appearing focal osseous lesions. Marked lumbar spondylosis. VASCULAR MEASUREMENTS PERTINENT TO TAVR: AORTA: Minimal Aortic Diameter-17.3 x 17.2 mm Severity of Aortic Calcification-moderate RIGHT PELVIS: Right Common Iliac Artery - Minimal Diameter-8.1 x 6.7 mm Tortuosity-moderate Calcification-severe Right External Iliac Artery - Minimal Diameter-8.4 x 7.8 mm Tortuosity-moderate Calcification-mild-to-moderate Right Common Femoral Artery - Minimal Diameter-9.5 x 8.0 mm Tortuosity-mild Calcification-severe LEFT PELVIS: Left Common Iliac Artery - Minimal Diameter-8.3 x 7.2 mm Tortuosity-moderate Calcification-severe Left External Iliac Artery - Minimal Diameter-7.6 x 6.8 mm Tortuosity-mild-to-moderate Calcification-moderate Left Common Femoral Artery - Minimal Diameter-9.9  x 8.0 mm Tortuosity-mild Calcification-moderate Review of the MIP images confirms the above findings. IMPRESSION: 1. Vascular findings and measurements pertinent to potential TAVR procedure, as detailed. 2. Diffuse thickening and coarse calcification of the aortic valve, compatible with the reported history of severe aortic stenosis. 3. Mild cardiomegaly. Three-vessel coronary atherosclerosis. 4. Moderate dependent bilateral pleural effusions. 5. Mild diffuse interlobular septal thickening, compatible with mild pulmonary edema. 6. Mild porta  hepatis, mild mediastinal and mild bilateral hilar lymphadenopathy, nonspecific, potentially reactive. Suggest follow-up CT chest, abdomen and pelvis with IV contrast in 3 months. 7. Mild diffuse bladder wall thickening and trabeculation with tiny bladder diverticula, suggesting nonspecific chronic bladder voiding dysfunction. 8. Moderate colorectal stool, suggesting constipation. 9. Incompletely healed posterior right ninth through twelfth rib fractures. 10. Aortic Atherosclerosis (ICD10-I70.0). Electronically Signed   By: Ilona Sorrel M.D.   On: 03/07/2020 13:03    Cardiac Studies   R/L heart cath 03/06/20 1.  Nonobstructive coronary artery disease with mild nonobstructive plaquing in the RCA, wide patency of the left main, wide patency of the LAD, and mild to moderate mid circumflex stenosis 2.  Moderate aortic stenosis with mean gradient 26 mmHg and calculated aortic valve area 1.6 cm. 3.  Moderate pulmonary hypertension with mean PA pressure 34 mmHg, transpulmonary gradient 13 mmHg, PVR less than 2 Wood units  Echo 03/04/20 1. Left ventricular ejection fraction, by estimation, is 55 to 60%. The  left ventricle has normal function. The left ventricle has no regional  wall motion abnormalities. Left ventricular diastolic function could not  be evaluated.  2. Right ventricular systolic function is normal. The right ventricular  size is normal.  3. Left atrial size was moderately dilated.  4. Right atrial size was mildly dilated.  5. Large pleural effusion in the left lateral region.  6. The mitral valve is normal in structure. No evidence of mitral valve  regurgitation.  7. The aortic valve is grossly normal. There is moderate calcification of  the aortic valve. There is moderate thickening of the aortic valve. Aortic  valve regurgitation is mild. Moderate to severe aortic valve stenosis.     Patient Profile     69 y.o. male with pmh of COPD, DM2, HTN, obesity, OSA on CPAP who  presented with acute CHF now undergoing TAVR work-up.   Assessment & Plan    Severe AS - Undergoing TAVR work-up per Dr. Burt Knack - CT chest/abd/pelvis showed severe calcification of arotic valve with severe AS - Cath and echo as above - NPO for TEE today - Plan for discharge once TAVR work-up is complete  Acute on chronic diastolic CHF - Echo with EF 55-60% and large left pleural effusion - IV lasix held 03/05/20 for rising creatinine - Net 6.6L - Weight down 225lbs>>187lbs - creatinine improving, yesterday 0.97. Has some mild lower leg edema. Can likely start back home lasix dose 40 mg daily. AM labs pending  Bradycardia - Atenolol held - Heart rates 50-60s. Unsure with restarting BB since he is bradycardic at baseline  HTN - BB and Tekturna held - PTA hydralazine, also held - Pressures labile, this AM 115/50  HLD - atorvastatin  For questions or updates, please contact [facility] Please consult [email or web address] for contact info under      Signed, Cadence Ninfa Meeker, PA-C  03/09/2020, 7:45 AM    History and all data above reviewed.  Patient examined.  I agree with the findings as above.   He says that he feels OK.  He ambulated and reported that his O2 sats went down.  I don't see this recorded.  The patient exam reveals COR:RRR, systolic murmur  ,  Lungs: Decreased breath sounds without wheezing or crackles  ,  Abd: Positive bowel sounds, no rebound no guarding, Ext Moderate leg edema  .  All available labs, radiology testing, previous records reviewed. Agree with documented assessment and plan.   AS:  TEE today.  Further planning and consideration of TAVR will be completed as an out patient.  Hypoxemic Respiratory failure:  Still with volume overload.  Lasix was held secondary to increased creat.  I reviewed the CT images and CXR from admission.  He had significant bilateral pleural effusions and still has decreased O2 sats.  I will order PA/lateral CXR as he might benefit from  thoracentesis if his effusions are still large.    Jeneen Rinks Tayla Panozzo  11:31 AM  03/09/2020

## 2020-03-09 NOTE — CV Procedure (Signed)
     Transesophageal Echocardiogram Note  Jordan [name] [id number] 09/28/1950  Procedure: Transesophageal Echocardiogram Indications: aortic valve stenosis/regurgitation  Procedure Details Consent: Obtained Time Out: Verified patient identification, verified procedure, site/side was marked, verified correct patient position, special equipment/implants available, Radiology Safety Procedures followed,  medications/allergies/relevent history reviewed, required imaging and test results available.  Performed  Medications:  During this procedure the patient is administered a total of Propofol 250 mg and Lidocaine 25 mg administered by anesthesia staff to achieve and maintain deep sedation.  The patient's heart rate, blood pressure, and oxygen saturation are monitored continuously during the procedure. The period of conscious sedation is 30 minutes, of which I was present face-to-face 100% of this time.  Left Ventrical:  LVEF 60-65%  Mitral Valve: trivial MR  Aortic Valve: moderately calcified and thickened aortic valve with moderately restricted leaflet opening, transaortic gradients in the severe range (mean gradient 43 mmHg) most probably sec to high flow sec to severe aortic regurgitation  Tricuspid Valve: mild TR  Pulmonic Valve: no PR  Left Atrium/ Left atrial appendage: no thrombus  Atrial septum: PFO present, positive bubble study  Aorta: moderate non-mobile plaque  Complications: No apparent complications Patient did tolerate procedure well.  [name], MD, [facility] 03/09/2020, 2:51 PM

## 2020-03-09 NOTE — Anesthesia Postprocedure Evaluation (Signed)
Anesthesia Post Note  Patient: Jordan [name]  Procedure(s) Performed: TRANSESOPHAGEAL ECHOCARDIOGRAM (TEE) (N/A )     Patient location during evaluation: Endoscopy Anesthesia Type: MAC Level of consciousness: awake Pain management: pain level controlled Vital Signs Assessment: post-procedure vital signs reviewed and stable Respiratory status: spontaneous breathing Cardiovascular status: stable Postop Assessment: no apparent nausea or vomiting Anesthetic complications: no   No complications documented.  Last Vitals:  Vitals:   03/09/20 1450 03/09/20 1500  BP: (!) 113/31 (!) 137/52  Pulse: 65 65  Resp: 11 13  Temp:    SpO2: 91% 93%    Last Pain:  Vitals:   03/09/20 1500  TempSrc:   PainSc: 0-No pain                 John F Salome Arnt

## 2020-03-09 NOTE — H&P (View-Only) (Signed)
Progress Note  Patient Name: Jordan [name] Date of Encounter: 03/09/2020  [facility] Cardiologist: Candee Furbish, MD   Subjective   I/Os not complete overnight, but he is -6.6 since admission. Overall feeling better. No chest pain. He is working with PT during interview. Creatinine normalized.   Inpatient Medications    Scheduled Meds: . aspirin EC  81 mg Oral Daily  . atorvastatin  80 mg Oral QPC supper  . budesonide (PULMICORT) nebulizer solution  0.25 mg Nebulization BID  . enoxaparin (LOVENOX) injection  40 mg Subcutaneous Q24H  . feeding supplement (ENSURE ENLIVE)  237 mL Oral BID BM  . folic acid  1 mg Oral Daily  . hydrocerin   Topical TID  . influenza vaccine adjuvanted  0.5 mL Intramuscular Tomorrow-1000  . multivitamin with minerals  1 tablet Oral Daily  . pantoprazole  40 mg Oral Daily  . sodium chloride flush  3 mL Intravenous Q12H  . sodium chloride flush  3 mL Intravenous Q12H  . venlafaxine XR  150 mg Oral QPC supper   Continuous Infusions: . sodium chloride     PRN Meds: sodium chloride, acetaminophen, butalbital-acetaminophen-caffeine, ipratropium-albuterol, LORazepam, ondansetron (ZOFRAN) IV, sodium chloride flush   Vital Signs    Vitals:   03/08/20 2121 03/09/20 0051 03/09/20 0531 03/09/20 0741  BP:  (!) 136/50 (!) 115/50   Pulse:   (!) 54 (!) 55  Resp:  14 15 18   Temp:  98.2 F (36.8 C) 98.1 F (36.7 C)   TempSrc:  Oral Oral   SpO2: 99% 93% 93% 96%  Weight:   85.1 kg   Height:        Intake/Output Summary (Last 24 hours) at 03/09/2020 0745 Last data filed at 03/08/2020 2250 Gross per 24 hour  Intake 3 ml  Output --  Net 3 ml   Last 3 Weights 03/09/2020 03/08/2020 03/07/2020  Weight (lbs) 187 lb 11.2 oz 167 lb 12.8 oz 185 lb 3 oz  Weight (kg) 85.14 kg 76.114 kg 84 kg      Telemetry    SR, PAC, 4 beats NSVT HR 50-60s - Personally Reviewed  ECG    No new - Personally Reviewed  Physical Exam   GEN: No acute distress.   Neck:  No JVD Cardiac: RRR, no murmurs, rubs, or gallops.  Respiratory: diminished at bases. GI: Soft, nontender, non-distended  MS: mild B/L edema; No deformity. Neuro:  Nonfocal  Psych: Normal affect   Labs    High Sensitivity Troponin:  No results for input(s): TROPONINIHS in the last 720 hours.    Chemistry Recent Labs  Lab 03/03/20 1600 03/04/20 0440 03/06/20 0508 03/06/20 1224 03/06/20 1225 03/07/20 0458 03/08/20 0751  NA 133*   < > 137   < > 141 139 135  K 6.1*   < > 4.5   < > 3.9 4.3 4.7  CL 97*   < > 96*  --   --  102 100  CO2 28   < > 33*  --   --  28 29  GLUCOSE 87   < > 77  --   --  83 75  BUN 25*   < > 32*  --   --  26* 20  CREATININE 1.10   < > 1.38*  --   --  0.97 0.97  CALCIUM 8.5*   < > 7.8*  --   --  8.2* 8.3*  PROT 5.6*  --   --   --   --   --   --  ALBUMIN 3.1*  --   --   --   --   --   --   AST 25  --   --   --   --   --   --   ALT 13  --   --   --   --   --   --   ALKPHOS 151*  --   --   --   --   --   --   BILITOT 1.3*  --   --   --   --   --   --   GFRNONAA >60   < > 52*  --   --  >60 >60  GFRAA >60   < > >60  --   --  >60 >60  ANIONGAP 8   < > 8  --   --  9 6   < > = values in this interval not displayed.     Hematology Recent Labs  Lab 03/07/20 0458 03/08/20 0751 03/09/20 0255  WBC 6.8 7.5 7.0  RBC 4.12* 3.95* 4.03*  HGB 12.3* 11.7* 11.6*  HCT 39.1 37.4* 37.6*  MCV 94.9 94.7 93.3  MCH 29.9 29.6 28.8  MCHC 31.5 31.3 30.9  RDW 15.0 14.7 14.6  PLT 145* 144* 157    BNP Recent Labs  Lab 03/03/20 1600  BNP 2,454.0*     DDimer No results for input(s): DDIMER in the last 168 hours.   Radiology    CT CORONARY MORPH W/CTA COR W/SCORE W/CA W/CM &/OR WO/CM  Result Date: 03/07/2020 EXAM: OVER-READ INTERPRETATION  CT CHEST The following report is an over-read performed by radiologist Dr. Salvatore Marvel of [facility], [place] on 03/07/2020. This over-read does not include interpretation of cardiac or coronary anatomy or pathology. The  coronary CTA interpretation by the cardiologist is attached. COMPARISON:  None. FINDINGS: Please see the separate concurrent chest CT angiogram report for details. IMPRESSION: Please see the separate concurrent chest CT angiogram report for details. Electronically Signed   By: Ilona Sorrel M.D.   On: 03/07/2020 12:38   CT ANGIO CHEST AORTA W/CM & OR WO/CM  Result Date: 03/07/2020 CLINICAL DATA:  Inpatient. Severe aortic stenosis. Acute on chronic heart failure. TAVR evaluation. EXAM: CT ANGIOGRAPHY CHEST, ABDOMEN AND PELVIS TECHNIQUE: Multidetector CT imaging through the chest, abdomen and pelvis was performed using the standard protocol during bolus administration of intravenous contrast. Multiplanar reconstructed images and MIPs were obtained and reviewed to evaluate the vascular anatomy. CONTRAST:  175mL OMNIPAQUE IOHEXOL 350 MG/ML SOLN COMPARISON:  03/04/2020 chest radiograph. FINDINGS: CTA CHEST FINDINGS Cardiovascular: Mild cardiomegaly. No significant pericardial effusion/thickening. Three-vessel coronary atherosclerosis. Atherosclerotic nonaneurysmal thoracic aorta, including ascending thoracic aortic atherosclerotic calcification. Diffuse thickening and coarse calcification of the aortic valve. Normal caliber pulmonary arteries. No central pulmonary emboli. Mediastinum/Nodes: No discrete thyroid nodules. Unremarkable esophagus. No axillary adenopathy. Mild right paratracheal adenopathy up to 1.4 cm (series 6/image 60). Mildly enlarged 1.1 cm subcarinal node (series 6/image 70). Mild AP window adenopathy up to 1.3 cm (series 6/image 59). Mild bilateral hilar adenopathy up to 1.4 cm on the right (series 6/image 66) and 1.2 cm on the left (series 6/image 65). Lungs/Pleura: No pneumothorax. Moderate dependent bilateral pleural effusions. Moderate dependent bilateral lower lobe passive atelectasis. Mild diffuse interlobular septal thickening. No acute consolidative airspace disease, lung masses or  significant pulmonary nodules. Musculoskeletal: No aggressive appearing focal osseous lesions. Incompletely healed posterior right ninth through twelfth rib fractures. Moderate thoracic spondylosis. CTA  ABDOMEN AND PELVIS FINDINGS Hepatobiliary: Normal liver with no liver mass. Normal gallbladder with no radiopaque cholelithiasis. No biliary ductal dilatation. Pancreas: Normal, with no mass or duct dilation. Spleen: Normal size. No mass. Adrenals/Urinary Tract: Normal adrenals. No hydronephrosis. Simple 2.5 cm anterior upper left renal cyst. Additional subcentimeter hypodense renal cortical lesions scattered in both kidneys are too small to characterize and require no follow-up. Excreted contrast in the bladder. Mild diffuse bladder wall thickening and trabeculation with tiny bladder diverticula. Stomach/Bowel: Normal non-distended stomach. Normal caliber small bowel with no small bowel wall thickening. Normal appendix. Moderate colorectal stool. No large bowel wall thickening, diverticulosis or significant pericolonic fat stranding. Vascular/Lymphatic: Atherosclerotic nonaneurysmal abdominal aorta. Mild porta hepatis adenopathy up to 1.3 cm (series 6/image 119). No pelvic adenopathy. Reproductive: Top-normal size prostate. Other: No pneumoperitoneum, ascites or focal fluid collection. Musculoskeletal: No aggressive appearing focal osseous lesions. Marked lumbar spondylosis. VASCULAR MEASUREMENTS PERTINENT TO TAVR: AORTA: Minimal Aortic Diameter-17.3 x 17.2 mm Severity of Aortic Calcification-moderate RIGHT PELVIS: Right Common Iliac Artery - Minimal Diameter-8.1 x 6.7 mm Tortuosity-moderate Calcification-severe Right External Iliac Artery - Minimal Diameter-8.4 x 7.8 mm Tortuosity-moderate Calcification-mild-to-moderate Right Common Femoral Artery - Minimal Diameter-9.5 x 8.0 mm Tortuosity-mild Calcification-severe LEFT PELVIS: Left Common Iliac Artery - Minimal Diameter-8.3 x 7.2 mm Tortuosity-moderate  Calcification-severe Left External Iliac Artery - Minimal Diameter-7.6 x 6.8 mm Tortuosity-mild-to-moderate Calcification-moderate Left Common Femoral Artery - Minimal Diameter-9.9 x 8.0 mm Tortuosity-mild Calcification-moderate Review of the MIP images confirms the above findings. IMPRESSION: 1. Vascular findings and measurements pertinent to potential TAVR procedure, as detailed. 2. Diffuse thickening and coarse calcification of the aortic valve, compatible with the reported history of severe aortic stenosis. 3. Mild cardiomegaly. Three-vessel coronary atherosclerosis. 4. Moderate dependent bilateral pleural effusions. 5. Mild diffuse interlobular septal thickening, compatible with mild pulmonary edema. 6. Mild porta hepatis, mild mediastinal and mild bilateral hilar lymphadenopathy, nonspecific, potentially reactive. Suggest follow-up CT chest, abdomen and pelvis with IV contrast in 3 months. 7. Mild diffuse bladder wall thickening and trabeculation with tiny bladder diverticula, suggesting nonspecific chronic bladder voiding dysfunction. 8. Moderate colorectal stool, suggesting constipation. 9. Incompletely healed posterior right ninth through twelfth rib fractures. 10. Aortic Atherosclerosis (ICD10-I70.0). Electronically Signed   By: Ilona Sorrel M.D.   On: 03/07/2020 13:03   VAS US CAROTID  Result Date: 03/08/2020 Carotid Arterial Duplex Study Risk Factors:      Hypertension, Diabetes. Other Factors:     Aortic stenosis, pre-TAVR. Limitations        Today's exam was limited due to facial hair, body habitus and                    the patient's respiratory variation. Comparison Study:  No prior study on file Performing Technologist: Sharion Dove RVS  Examination Guidelines: A complete evaluation includes B-mode imaging, spectral Doppler, color Doppler, and power Doppler as needed of all accessible portions of each vessel. Bilateral testing is considered an integral part of a complete examination. Limited  examinations for reoccurring indications may be performed as noted.  Right Carotid Findings: +----------+--------+--------+--------+------------------+------------------+           PSV cm/sEDV cm/sStenosisPlaque DescriptionComments           +----------+--------+--------+--------+------------------+------------------+ CCA Prox  63      9                                 intimal thickening +----------+--------+--------+--------+------------------+------------------+ CCA Distal63  7                                 intimal thickening +----------+--------+--------+--------+------------------+------------------+ ICA Prox  68      8               heterogenous                         +----------+--------+--------+--------+------------------+------------------+ ICA Distal89      14                                tortuous           +----------+--------+--------+--------+------------------+------------------+ ECA       47      3                                                    +----------+--------+--------+--------+------------------+------------------+ +----------+--------+-------+--------+-------------------+           PSV cm/sEDV cmsDescribeArm Pressure (mmHG) +----------+--------+-------+--------+-------------------+ [id number]                                         +----------+--------+-------+--------+-------------------+ +---------+--------+--+--------+--+ VertebralPSV cm/s51EDV cm/s14 +---------+--------+--+--------+--+  Left Carotid Findings: +----------+--------+--------+--------+------------------+------------------+           PSV cm/sEDV cm/sStenosisPlaque DescriptionComments           +----------+--------+--------+--------+------------------+------------------+ CCA Prox  89      17                                intimal thickening +----------+--------+--------+--------+------------------+------------------+ CCA Distal78      14                                 intimal thickening +----------+--------+--------+--------+------------------+------------------+ ICA Prox  105     27              heterogenous                         +----------+--------+--------+--------+------------------+------------------+ ICA Distal77      18                                tortuous           +----------+--------+--------+--------+------------------+------------------+ ECA       138     12                                                   +----------+--------+--------+--------+------------------+------------------+ +----------+--------+--------+--------+-------------------+           PSV cm/sEDV cm/sDescribeArm Pressure (mmHG) +----------+--------+--------+--------+-------------------+ [id number]                                          +----------+--------+--------+--------+-------------------+ +---------+--------+--+--------+--+  VertebralPSV cm/s51EDV cm/s12 +---------+--------+--+--------+--+   Summary: Right Carotid: The extracranial vessels were near-normal with only minimal wall                thickening or plaque. Left Carotid: The extracranial vessels were near-normal with only minimal wall               thickening or plaque. Vertebrals:  Bilateral vertebral arteries demonstrate antegrade flow. Subclavians: Normal flow hemodynamics were seen in bilateral subclavian              arteries. *See table(s) above for measurements and observations.  Electronically signed by Servando Snare MD on 03/08/2020 at 11:04:14 AM.    Final    CT Angio Abd/Pel w/ and/or w/o  Result Date: 03/07/2020 CLINICAL DATA:  Inpatient. Severe aortic stenosis. Acute on chronic heart failure. TAVR evaluation. EXAM: CT ANGIOGRAPHY CHEST, ABDOMEN AND PELVIS TECHNIQUE: Multidetector CT imaging through the chest, abdomen and pelvis was performed using the standard protocol during bolus administration of intravenous contrast. Multiplanar  reconstructed images and MIPs were obtained and reviewed to evaluate the vascular anatomy. CONTRAST:  161mL OMNIPAQUE IOHEXOL 350 MG/ML SOLN COMPARISON:  03/04/2020 chest radiograph. FINDINGS: CTA CHEST FINDINGS Cardiovascular: Mild cardiomegaly. No significant pericardial effusion/thickening. Three-vessel coronary atherosclerosis. Atherosclerotic nonaneurysmal thoracic aorta, including ascending thoracic aortic atherosclerotic calcification. Diffuse thickening and coarse calcification of the aortic valve. Normal caliber pulmonary arteries. No central pulmonary emboli. Mediastinum/Nodes: No discrete thyroid nodules. Unremarkable esophagus. No axillary adenopathy. Mild right paratracheal adenopathy up to 1.4 cm (series 6/image 60). Mildly enlarged 1.1 cm subcarinal node (series 6/image 70). Mild AP window adenopathy up to 1.3 cm (series 6/image 59). Mild bilateral hilar adenopathy up to 1.4 cm on the right (series 6/image 66) and 1.2 cm on the left (series 6/image 65). Lungs/Pleura: No pneumothorax. Moderate dependent bilateral pleural effusions. Moderate dependent bilateral lower lobe passive atelectasis. Mild diffuse interlobular septal thickening. No acute consolidative airspace disease, lung masses or significant pulmonary nodules. Musculoskeletal: No aggressive appearing focal osseous lesions. Incompletely healed posterior right ninth through twelfth rib fractures. Moderate thoracic spondylosis. CTA ABDOMEN AND PELVIS FINDINGS Hepatobiliary: Normal liver with no liver mass. Normal gallbladder with no radiopaque cholelithiasis. No biliary ductal dilatation. Pancreas: Normal, with no mass or duct dilation. Spleen: Normal size. No mass. Adrenals/Urinary Tract: Normal adrenals. No hydronephrosis. Simple 2.5 cm anterior upper left renal cyst. Additional subcentimeter hypodense renal cortical lesions scattered in both kidneys are too small to characterize and require no follow-up. Excreted contrast in the bladder.  Mild diffuse bladder wall thickening and trabeculation with tiny bladder diverticula. Stomach/Bowel: Normal non-distended stomach. Normal caliber small bowel with no small bowel wall thickening. Normal appendix. Moderate colorectal stool. No large bowel wall thickening, diverticulosis or significant pericolonic fat stranding. Vascular/Lymphatic: Atherosclerotic nonaneurysmal abdominal aorta. Mild porta hepatis adenopathy up to 1.3 cm (series 6/image 119). No pelvic adenopathy. Reproductive: Top-normal size prostate. Other: No pneumoperitoneum, ascites or focal fluid collection. Musculoskeletal: No aggressive appearing focal osseous lesions. Marked lumbar spondylosis. VASCULAR MEASUREMENTS PERTINENT TO TAVR: AORTA: Minimal Aortic Diameter-17.3 x 17.2 mm Severity of Aortic Calcification-moderate RIGHT PELVIS: Right Common Iliac Artery - Minimal Diameter-8.1 x 6.7 mm Tortuosity-moderate Calcification-severe Right External Iliac Artery - Minimal Diameter-8.4 x 7.8 mm Tortuosity-moderate Calcification-mild-to-moderate Right Common Femoral Artery - Minimal Diameter-9.5 x 8.0 mm Tortuosity-mild Calcification-severe LEFT PELVIS: Left Common Iliac Artery - Minimal Diameter-8.3 x 7.2 mm Tortuosity-moderate Calcification-severe Left External Iliac Artery - Minimal Diameter-7.6 x 6.8 mm Tortuosity-mild-to-moderate Calcification-moderate Left Common Femoral Artery - Minimal Diameter-9.9  x 8.0 mm Tortuosity-mild Calcification-moderate Review of the MIP images confirms the above findings. IMPRESSION: 1. Vascular findings and measurements pertinent to potential TAVR procedure, as detailed. 2. Diffuse thickening and coarse calcification of the aortic valve, compatible with the reported history of severe aortic stenosis. 3. Mild cardiomegaly. Three-vessel coronary atherosclerosis. 4. Moderate dependent bilateral pleural effusions. 5. Mild diffuse interlobular septal thickening, compatible with mild pulmonary edema. 6. Mild porta  hepatis, mild mediastinal and mild bilateral hilar lymphadenopathy, nonspecific, potentially reactive. Suggest follow-up CT chest, abdomen and pelvis with IV contrast in 3 months. 7. Mild diffuse bladder wall thickening and trabeculation with tiny bladder diverticula, suggesting nonspecific chronic bladder voiding dysfunction. 8. Moderate colorectal stool, suggesting constipation. 9. Incompletely healed posterior right ninth through twelfth rib fractures. 10. Aortic Atherosclerosis (ICD10-I70.0). Electronically Signed   By: Ilona Sorrel M.D.   On: 03/07/2020 13:03    Cardiac Studies   R/L heart cath 03/06/20 1.  Nonobstructive coronary artery disease with mild nonobstructive plaquing in the RCA, wide patency of the left main, wide patency of the LAD, and mild to moderate mid circumflex stenosis 2.  Moderate aortic stenosis with mean gradient 26 mmHg and calculated aortic valve area 1.6 cm. 3.  Moderate pulmonary hypertension with mean PA pressure 34 mmHg, transpulmonary gradient 13 mmHg, PVR less than 2 Wood units  Echo 03/04/20 1. Left ventricular ejection fraction, by estimation, is 55 to 60%. The  left ventricle has normal function. The left ventricle has no regional  wall motion abnormalities. Left ventricular diastolic function could not  be evaluated.  2. Right ventricular systolic function is normal. The right ventricular  size is normal.  3. Left atrial size was moderately dilated.  4. Right atrial size was mildly dilated.  5. Large pleural effusion in the left lateral region.  6. The mitral valve is normal in structure. No evidence of mitral valve  regurgitation.  7. The aortic valve is grossly normal. There is moderate calcification of  the aortic valve. There is moderate thickening of the aortic valve. Aortic  valve regurgitation is mild. Moderate to severe aortic valve stenosis.     Patient Profile     69 y.o. male with pmh of COPD, DM2, HTN, obesity, OSA on CPAP who  presented with acute CHF now undergoing TAVR work-up.   Assessment & Plan    Severe AS - Undergoing TAVR work-up per Dr. Burt Knack - CT chest/abd/pelvis showed severe calcification of arotic valve with severe AS - Cath and echo as above - NPO for TEE today - Plan for discharge once TAVR work-up is complete  Acute on chronic diastolic CHF - Echo with EF 55-60% and large left pleural effusion - IV lasix held 03/05/20 for rising creatinine - Net 6.6L - Weight down 225lbs>>187lbs - creatinine improving, yesterday 0.97. Has some mild lower leg edema. Can likely start back home lasix dose 40 mg daily. AM labs pending  Bradycardia - Atenolol held - Heart rates 50-60s. Unsure with restarting BB since he is bradycardic at baseline  HTN - BB and [place] held - PTA hydralazine, also held - Pressures labile, this AM 115/50  HLD - atorvastatin  For questions or updates, please contact [facility] Please consult [email or web address] for contact info under      Signed, Cadence Ninfa Meeker, PA-C  03/09/2020, 7:45 AM    History and all data above reviewed.  Patient examined.  I agree with the findings as above.   He says that he feels OK.  He ambulated and reported that his O2 sats went down.  I don't see this recorded.  The patient exam reveals COR:RRR, systolic murmur  ,  Lungs: Decreased breath sounds without wheezing or crackles  ,  Abd: Positive bowel sounds, no rebound no guarding, Ext Moderate leg edema  .  All available labs, radiology testing, previous records reviewed. Agree with documented assessment and plan.   AS:  TEE today.  Further planning and consideration of TAVR will be completed as an out patient.  Hypoxemic Respiratory failure:  Still with volume overload.  Lasix was held secondary to increased creat.  I reviewed the CT images and CXR from admission.  He had significant bilateral pleural effusions and still has decreased O2 sats.  I will order PA/lateral CXR as he might benefit from  thoracentesis if his effusions are still large.    Jeneen Rinks [place]  11:31 AM  03/09/2020

## 2020-03-09 NOTE — Progress Notes (Signed)
  Echocardiogram Echocardiogram Transesophageal has been performed.  Jordan [name] 03/09/2020, 2:37 PM

## 2020-03-09 NOTE — TOC Transition Note (Signed)
Transition of Care [facility]) - CM/SW Discharge Note   Patient Details  Name: Jordan [name] MRN: [id number] Date of Birth: [birth year]  Transition of Care [facility] CM/SW Contact:  Ninfa Meeker, RN Phone Number: [phone] 03/09/2020, 1:09 PM   Clinical Narrative:   Patient has completed workup for chest pain. Will dic home with Home Health, Case manager spoke with patient concerning need for [facility] therapy. Choice was offered, patient requests that therapist see him in the afternoons. CM relayed that request to Adela Lank, [place] Liaison when referral was called. Rollator will be delivered to patient's room from [place].    Final next level of care: [place] Barriers to Discharge: No Barriers Identified   Patient Goals and CMS Choice Patient states their goals for this hospitalization and ongoing recovery are:: get better   Choice offered to / list presented to : Patient  Discharge Placement                       Discharge Plan and Services In-house Referral: NA Discharge Planning Services: CM Consult Post Acute Care Choice: Home Health, Durable Medical Equipment          DME Arranged: Walker rolling with seat DME Agency: [employer] Date DME Agency Contacted: 03/09/20 Time DME Agency Contacted: [id number] Representative spoke with at DME Agency: [place]: PT [place]: [place] Date [place]: 03/09/20 Time [place] Agency Contacted: 6 Representative spoke with at [place]: [place] ([place]) Interventions     Readmission Risk Interventions No flowsheet data found.

## 2020-03-09 NOTE — Progress Notes (Signed)
Orthopedic Tech Progress Note Patient Details:  Jordan [name] 21-Sep-1950 [id number]  Ortho Devices Type of Ortho Device: Louretta Parma boot Ortho Device/Splint Location: BLE Ortho Device/Splint Interventions: Ordered, Application   Post Interventions Patient Tolerated: Well Instructions Provided: Care of device   [name] 03/09/2020, 8:01 PM

## 2020-03-09 NOTE — Care Management Important Message (Signed)
Important Message  Patient Details  Name: Jordan [name] MRN: [id number] Date of Birth: 03/11/1951   Medicare Important Message Given:  Yes     Shelda Altes 03/09/2020, 11:39 AM

## 2020-03-09 NOTE — Progress Notes (Addendum)
Hypoglycemic Event  CBG: 57  Treatment: 4 oz juice/soda  Symptoms: None  Follow-up CBG: Time: 2236 CBG Result:75  Possible Reasons for Event: Inadequate meal intake  Comments/MD notified:Pt eating another snack post CBG 75    Ellise Kovack W

## 2020-03-10 ENCOUNTER — Encounter (HOSPITAL_COMMUNITY): Payer: Self-pay | Admitting: Internal Medicine

## 2020-03-10 ENCOUNTER — Encounter (HOSPITAL_COMMUNITY): Payer: Medicare PPO

## 2020-03-10 DIAGNOSIS — I351 Nonrheumatic aortic (valve) insufficiency: Secondary | ICD-10-CM

## 2020-03-10 DIAGNOSIS — E43 Unspecified severe protein-calorie malnutrition: Secondary | ICD-10-CM

## 2020-03-10 DIAGNOSIS — K089 Disorder of teeth and supporting structures, unspecified: Secondary | ICD-10-CM

## 2020-03-10 DIAGNOSIS — J449 Chronic obstructive pulmonary disease, unspecified: Secondary | ICD-10-CM

## 2020-03-10 LAB — GLUCOSE, RANDOM: Glucose, Bld: 247 mg/dL — ABNORMAL HIGH (ref 70–99)

## 2020-03-10 LAB — GLUCOSE, CAPILLARY
Glucose-Capillary: 93 mg/dL (ref 70–99)
Glucose-Capillary: 82 mg/dL (ref 70–99)
Glucose-Capillary: 27 mg/dL — CL (ref 70–99)
Glucose-Capillary: 31 mg/dL — CL (ref 70–99)
Glucose-Capillary: 45 mg/dL — ABNORMAL LOW (ref 70–99)
Glucose-Capillary: 259 mg/dL — ABNORMAL HIGH (ref 70–99)
Glucose-Capillary: 39 mg/dL — CL (ref 70–99)
Glucose-Capillary: 46 mg/dL — ABNORMAL LOW (ref 70–99)
Glucose-Capillary: 229 mg/dL — ABNORMAL HIGH (ref 70–99)
Glucose-Capillary: 111 mg/dL — ABNORMAL HIGH (ref 70–99)
Glucose-Capillary: 112 mg/dL — ABNORMAL HIGH (ref 70–99)
Glucose-Capillary: 40 mg/dL — CL (ref 70–99)
Glucose-Capillary: 67 mg/dL — ABNORMAL LOW (ref 70–99)
Glucose-Capillary: 89 mg/dL (ref 70–99)
Glucose-Capillary: 85 mg/dL (ref 70–99)
Glucose-Capillary: 107 mg/dL — ABNORMAL HIGH (ref 70–99)
Glucose-Capillary: 112 mg/dL — ABNORMAL HIGH (ref 70–99)

## 2020-03-10 LAB — BASIC METABOLIC PANEL
Anion gap: 9 (ref 5–15)
BUN: 23 mg/dL (ref 8–23)
CO2: 27 mmol/L (ref 22–32)
Calcium: 8 mg/dL — ABNORMAL LOW (ref 8.9–10.3)
Chloride: 97 mmol/L — ABNORMAL LOW (ref 98–111)
Creatinine, Ser: 1.23 mg/dL (ref 0.61–1.24)
GFR calc Af Amer: >60 mL/min (ref >60)
GFR calc non Af Amer: 60 mL/min — ABNORMAL LOW (ref >60)
Glucose, Bld: 96 mg/dL (ref 70–99)
Potassium: 4.4 mmol/L (ref 3.5–5.1)
Sodium: 133 mmol/L — ABNORMAL LOW (ref 135–145)

## 2020-03-10 MED ORDER — DEXTROSE-NACL 5-0.45 % IV SOLN: INTRAVENOUS | Status: DC

## 2020-03-10 MED ORDER — DEXTROSE 50 % IV SOLN
INTRAVENOUS | Status: AC
  Filled 2020-03-10: qty 50

## 2020-03-10 MED ORDER — TAMSULOSIN HCL 0.4 MG PO CAPS
0.4000 mg | ORAL_CAPSULE | Freq: Every day | ORAL | Status: DC
  Administered 2020-03-10 – 2020-03-12 (×3): 0.4 mg via ORAL
  Filled 2020-03-10 (×3): qty 1

## 2020-03-10 MED ORDER — DEXTROSE 50 % IV SOLN
25.0000 g | INTRAVENOUS | Status: AC
  Filled 2020-03-10: qty 50

## 2020-03-10 MED ORDER — DEXTROSE 50 % IV SOLN: 50.0000 mL | Freq: Once | INTRAVENOUS | Status: DC

## 2020-03-10 MED ORDER — HYDRALAZINE HCL 25 MG PO TABS
25.0000 mg | ORAL_TABLET | Freq: Three times a day (TID) | ORAL | Status: DC
  Administered 2020-03-10 – 2020-03-12 (×6): 25 mg via ORAL
  Filled 2020-03-10 (×7): qty 1

## 2020-03-10 NOTE — Progress Notes (Signed)
[facility]  PROGRESS NOTE  Jordan [name]:[id number] DOB: Oct 26, 1950 DOA: 03/03/2020 PCP: [name], MD   Brief HPI:   69 year old male with history of COPD, diabetes mellitus type 2, hypertension, obesity, OSA on CPAP presents with shortness of breath on exertion.  Chest x-ray showed bilateral edema, pleural effusion.  Patient was started on diuretics and admitted to hospitalist service.  Cardiology was consulted for severe aortic stenosis seen on echocardiogram.    Subjective   Patient seen and examined, breathing is improved.  Denies any complaints.   Assessment/Plan:     1. Severe aortic stenosis-conflicting evaluation for aortic stenosis as per cardiology, patient will need TEE for further evaluation.  In the meantime CTA cardiac TAVR protocol ordered, CT chest/abdomen/pelvis to evaluate anatomic suitability for TAVR access.  Cardiology following.  TEE done yesterday showed moderate calcified and thickened aortic valve and moderately restricted leaflet opening.  Transaortic gradients in severe range mean 43 mmHg.  Plan for TAVR as outpatient.  Patient to follow-up CT surgery on 04/08/2020  2. Acute on chronic diastolic heart failure-continue Lasix 40 mg p.o. daily.  3. Hypoglycemia-patient having recurrent episodes of hypoglycemia.  Unclear etiology.  He is not on insulin.  No previous history of diabetes mellitus.  Hemoglobin A1c is 5.3.  Patient has poor dentition, not sure of adequate intake.  Will obtain dietitian consult.  Also check C-peptide, insulin level, cortisol level in a.m.  Will start D5 half-normal saline at 50 mill per hour.  4. COPD-mild tachypnea, continue Pulmicort nebulization twice a day, DuoNeb nebulizer every 6 hours as needed.  5. Bradycardia-atenolol on hold.  6. Hypertension-blood pressure is now mildly elevated, restart hydralazine .  Will not start atenolol due to bradycardia.  7. Sleep apnea-continue CPAP at bedtime    8. Depression-continue venlafaxine     COVID-19 Labs  No results for input(s): DDIMER, FERRITIN, LDH, CRP in the last 72 hours.  Lab Results  Component Value Date   [place] NEGATIVE 03/03/2020     Scheduled medications:   . aspirin EC  81 mg Oral Daily  . atorvastatin  80 mg Oral QPC supper  . budesonide (PULMICORT) nebulizer solution  0.25 mg Nebulization BID  . dextrose      . dextrose      . dextrose      . dextrose      . enoxaparin (LOVENOX) injection  40 mg Subcutaneous Q24H  . feeding supplement (ENSURE ENLIVE)  237 mL Oral BID BM  . folic acid  1 mg Oral Daily  . furosemide  40 mg Oral Daily  . hydrocerin   Topical TID  . multivitamin with minerals  1 tablet Oral Daily  . pantoprazole  40 mg Oral Daily  . sodium chloride flush  3 mL Intravenous Q12H  . sodium chloride flush  3 mL Intravenous Q12H  . venlafaxine XR  150 mg Oral QPC supper         CBG: Recent Labs  Lab 03/10/20 1303 03/10/20 1316 03/10/20 1329 03/10/20 1341 03/10/20 1350  GLUCAP 27* 45* 31* 46* 39*    SpO2: 95 % O2 Flow Rate (L/min): 2 L/min FiO2 (%): 28 %    CBC: Recent Labs  Lab 03/03/20 1600 03/03/20 1600 03/05/20 0456 03/05/20 0456 03/06/20 0508 03/06/20 0508 03/06/20 1224 03/06/20 1225 03/07/20 0458 03/08/20 0751 03/09/20 0255  WBC 7.6   < > 10.6*  --  7.4  --   --   --  6.8 7.5 7.0  NEUTROABS 5.5  --   --   --   --   --   --   --   --   --   --   HGB 13.4   < > 12.0*   < > 12.1*   < > 11.6* 12.9* 12.3* 11.7* 11.6*  HCT 44.2   < > 37.5*   < > 38.6*   < > 34.0* 38.0* 39.1 37.4* 37.6*  MCV 95.9   < > 92.8  --  93.0  --   --   --  94.9 94.7 93.3  PLT 169   < > 189  --  159  --   --   --  145* 144* 157   < > = values in this interval not displayed.    Basic Metabolic Panel: Recent Labs  Lab 03/05/20 0456 03/05/20 1225 03/06/20 0508 03/06/20 1224 03/06/20 1225 03/07/20 0458 03/08/20 0751 03/09/20 0255 03/10/20 0540  NA 137   < > 137   < > 141 139  135 136 133*  K 4.0   < > 4.5   < > 3.9 4.3 4.7 4.9 4.4  CL 97*   < > 96*  --   --  102 100 100 97*  CO2 32   < > 33*  --   --  28 29 28 27   GLUCOSE 90   < > 77  --   --  83 75 76 96  BUN 34*   < > 32*  --   --  26* 20 21 23   CREATININE 1.64*   < > 1.38*  --   --  0.97 0.97 1.10 1.23  CALCIUM 7.7*   < > 7.8*  --   --  8.2* 8.3* 8.0* 8.0*  MG 1.8  --   --   --   --   --   --   --   --    < > = values in this interval not displayed.     Liver Function Tests: Recent Labs  Lab 03/03/20 1600  AST 25  ALT 13  ALKPHOS 151*  BILITOT 1.3*  PROT 5.6*  ALBUMIN 3.1*     Antibiotics: Anti-infectives (From admission, onward)   None       DVT prophylaxis: Lovenox  Code Status: Full code  Family Communication: No family at bedside    Status is: Inpatient  Dispo: The patient is from: Home              Anticipated d/c is to: Home              Anticipated d/c date is: 03/11/2020              Patient currently not medically stable for discharge  Barrier to discharge-currently undergoing preop work-up for TAVR  Pressure Injury 03/14/17 Stage II -  Partial thickness loss of dermis presenting as a shallow open ulcer with a red, pink wound bed without slough. (Active)  03/14/17   Location: Buttocks  Location Orientation: Right;Left  Staging: Stage II -  Partial thickness loss of dermis presenting as a shallow open ulcer with a red, pink wound bed without slough.  Wound Description (Comments):   Present on Admission: Yes    Consultants:  Cardiology  Procedures:  Echocardiogram   Objective   Vitals:   03/09/20 2139 03/10/20 0507 03/10/20 0745 03/10/20 0816  BP:  122/60 (!) 156/51   Pulse:  61 62   Resp:  16    Temp:  98 F (36.7 C) 98.5 F (36.9 C)   TempSrc:  Oral Oral   SpO2: 97% 95%  95%  Weight:  85.3 kg    Height:        Intake/Output Summary (Last 24 hours) at 03/10/2020 1411 Last data filed at 03/10/2020 1405 Gross per 24 hour  Intake 1831 ml  Output  2350 ml  Net -519 ml    09/19 1901 - 09/21 0700 In: 1006 [P.O.:600; I.V.:406] Out: 1500 [Urine:1500]  Filed Weights   03/09/20 0531 03/09/20 1326 03/10/20 0507  Weight: 85.1 kg 85.1 kg 85.3 kg    Physical Examination:  General-appears in no acute distress Heart-S1-S2, regular, no murmur auscultated Lungs-clear to auscultation bilaterally, no wheezing or crackles auscultated Abdomen-soft, nontender, no organomegaly Extremities-no edema in the lower extremities Neuro-alert, oriented x3, no focal deficit noted  Data Reviewed:   Recent Results (from the past 240 hour(s))  SARS Coronavirus 2 by RT PCR (hospital order, performed in [facility] hospital lab) Nasopharyngeal Nasopharyngeal Swab     Status: None   Collection Time: 03/03/20  4:03 PM   Specimen: Nasopharyngeal Swab  Result Value Ref Range Status   SARS Coronavirus 2 NEGATIVE NEGATIVE Final    Comment: (NOTE) SARS-CoV-2 target nucleic acids are NOT DETECTED.  The SARS-CoV-2 RNA is generally detectable in upper and lower respiratory specimens during the acute phase of infection. The lowest concentration of SARS-CoV-2 viral copies this assay can detect is 250 copies / mL. A negative result does not preclude SARS-CoV-2 infection and should not be used as the sole basis for treatment or other patient management decisions.  A negative result may occur with improper specimen collection / handling, submission of specimen other than nasopharyngeal swab, presence of viral mutation(s) within the areas targeted by this assay, and inadequate number of viral copies (<250 copies / mL). A negative result must be combined with clinical observations, patient history, and epidemiological information.  Fact Sheet for Patients:   StrictlyIdeas.no  Fact Sheet for Healthcare Providers: BankingDealers.co.za  This test is not yet approved or  cleared by the [place] FDA and has been  authorized for detection and/or diagnosis of SARS-CoV-2 by FDA under an Emergency Use Authorization (EUA).  This EUA will remain in effect (meaning this test can be used) for the duration of the COVID-19 declaration under Section 564(b)(1) of the Act, 21 U.S.C. section 360bbb-3(b)(1), unless the authorization is terminated or revoked sooner.  Performed at [facility], [place] [address], [place],  [address]     No results for input(s): LIPASE, AMYLASE in the last 168 hours. No results for input(s): AMMONIA in the last 168 hours.  Cardiac Enzymes: No results for input(s): CKTOTAL, CKMB, CKMBINDEX, TROPONINI in the last 168 hours. BNP (last 3 results) Recent Labs    03/03/20 1600  BNP 2,454.0*    ProBNP (last 3 results) No results for input(s): PROBNP in the last 8760 hours.  Studies:  DG Chest 2 View  Result Date: 03/09/2020 CLINICAL DATA:  Shortness of breath EXAM: CHEST - 2 VIEW COMPARISON:  03/07/2020 CT FINDINGS: Cardiac shadow is mildly enlarged. Bilateral pleural effusions and bibasilar atelectatic changes are noted stable from the prior exam. No new focal infiltrate is seen. No bony abnormality is noted. IMPRESSION: Bilateral effusions and adjacent atelectatic changes stable from the prior CT. Electronically Signed   By: Inez Catalina M.D.   On: 03/09/2020 21:37   ECHO TEE  Result  Date: 03/09/2020    TRANSESOPHOGEAL ECHO REPORT   Patient Name:   Jordan [name] Date of Exam: 03/09/2020 Medical Rec #:  [id number]         Height:       73.0 in Accession #:    [id number]        Weight:       187.7 lb Date of Birth:  [birth year]         BSA:          2.094 m Patient Age:    56 years          BP:           157/36 mmHg Patient Gender: M                 HR:           53 bpm. Exam Location:  Inpatient Procedure: Transesophageal Echo, Color Doppler, Cardiac Doppler and 3D Echo Indications:     Aortic valve disorder 424.1 / I35.9  History:         Patient has prior history of  Echocardiogram examinations, most                  recent 03/04/2020. CHF, COPD and TIA; Risk Factors:Hypertension,                  Diabetes, Dyslipidemia, Current Smoker and Sleep Apnea. GERD.  Sonographer:     Vickie Epley RDCS Referring Phys:  [place] Diagnosing Phys: [name] MD PROCEDURE: The transesophogeal probe was passed without difficulty through the esophogus of the patient. Sedation performed by different physician. The patient was monitored while under deep sedation. Anesthestetic sedation was provided intravenously by Anesthesiology: 250.2mg  of Propofol. Patients was under conscious sedation during this procedure. Anesthetic administered: 86mcg of Fentanyl. The patient's vital signs; including heart rate, blood pressure, and oxygen saturation; remained stable throughout the procedure. The patient developed no complications during the procedure. IMPRESSIONS  1. Moderately calcified and thickened aortic valve with moderately restricted leaflet openings, however transaortic gradients in the severe range (mean gradient 43 mmHg) secondary to high flow sec to severe aortic regurgitation.  2. Left ventricular ejection fraction, by estimation, is 60 to 65%. The left ventricle has normal function. The left ventricle has no regional wall motion abnormalities.  3. Right ventricular systolic function is normal. The right ventricular size is normal.  4. Left atrial size was mildly dilated. No left atrial/left atrial appendage thrombus was detected.  5. The mitral valve is normal in structure. Trivial mitral valve regurgitation. No evidence of mitral stenosis.  6. The aortic valve is normal in structure. Aortic valve regurgitation is severe. Moderate to severe aortic valve stenosis. Aortic valve mean gradient measures 43.0 mmHg.  7. The inferior vena cava is normal in size with greater than 50% respiratory variability, suggesting right atrial pressure of 3 mmHg. Conclusion(s)/Recommendation(s):  Normal biventricular function without evidence of hemodynamically significant valvular heart disease. FINDINGS  Left Ventricle: Left ventricular ejection fraction, by estimation, is 60 to 65%. The left ventricle has normal function. The left ventricle has no regional wall motion abnormalities. The left ventricular internal cavity size was normal in size. There is  no left ventricular hypertrophy. Right Ventricle: The right ventricular size is normal. No increase in right ventricular wall thickness. Right ventricular systolic function is normal. Left Atrium: Left atrial size was mildly dilated. No left atrial/left atrial appendage thrombus was detected. Right Atrium: Right atrial  size was normal in size. Pericardium: There is no evidence of pericardial effusion. Mitral Valve: The mitral valve is normal in structure. Trivial mitral valve regurgitation. No evidence of mitral valve stenosis. Tricuspid Valve: The tricuspid valve is normal in structure. Tricuspid valve regurgitation is mild . No evidence of tricuspid stenosis. Aortic Valve: The aortic valve is normal in structure. Aortic valve regurgitation is severe. Aortic regurgitation PHT measures 424 msec. Moderate to severe aortic stenosis is present. Aortic valve mean gradient measures 43.0 mmHg. Aortic valve peak gradient measures 73.8 mmHg. Pulmonic Valve: The pulmonic valve was normal in structure. Pulmonic valve regurgitation is not visualized. No evidence of pulmonic stenosis. Aorta: The aortic root is normal in size and structure. Venous: The inferior vena cava is normal in size with greater than 50% respiratory variability, suggesting right atrial pressure of 3 mmHg. IAS/Shunts: No atrial level shunt detected by color flow Doppler.  AORTIC VALVE AV Vmax:           429.50 cm/s AV Vmean:          301.500 cm/s AV VTI:            0.874 m AV Peak Grad:      73.8 mmHg AV Mean Grad:      43.0 mmHg LVOT Vmax:         88.90 cm/s LVOT Vmean:        59.250 cm/s LVOT  VTI:          0.165 m LVOT/AV VTI ratio: 0.19 AI PHT:            424 msec  SHUNTS Systemic VTI: 0.16 m [name] MD Electronically signed by [name] MD Signature Date/Time: 03/09/2020/3:18:40 PM    Final (Updated)        [facility] If 7PM-7AM, please contact night-coverage at [email or web address], Office  [phone]   03/10/2020, 2:11 PM  LOS: 7 days

## 2020-03-10 NOTE — Progress Notes (Signed)
ReDS Clip Diuretic Study Pt study # [id number]  Your patient has been enrolled in the ReDS Clip Diuretic Study  Urine output at 1.5L/24h. Wt charted as up (3 lbs from 9/18). Scr up 0.97>1.23. Resumed lasix 40 mg PO daily on 9/20. Possible thoracentesis for bilateral pleural effusions.  Changes to prescribed diuretics recommended:  No changes - agree with cardiology plans for continuing lasix +/- thoracentesis  Provider contacted: Cadence Furth, PA   REDS Clip  READING= 32%  CHEST RULER = 33 Clip Station = D   Orthodema score = 0 Signs/Symptoms Score   Mild edema, no orthopnea 0 No congestion  Moderate edema, no orthopnea 1 Low-grade orthodema/congestion  Severe edema OR orthopnea 2   Moderate edema and orthopnea 3 High-grade orthodema/congestion  Severe edema AND orthopnea 4    Kerby Nora, PharmD, BCPS Phone [phone] 03/10/2020       9:12 AM  Please check [email or web address] for unit-specific pharmacist phone numbers

## 2020-03-10 NOTE — Progress Notes (Addendum)
Hypoglycemic Event  1303: CBG: 27 Treatment: 25g IV Dextrose  Symptoms: Asymptomatic Follow-up CBG: Time: 1316   CBG Result: 45  1329:  CBG: 31  Treatment: 25gm IV Dextrose  Follow -up CBG Time: 1341  CBG Results : 46   1341: CBG: 46 Follow - up Time: 1350 CBG Results: 39 Treatment: 25 gm IV Dextrose   Pt also given 8oz of Orange juice and ate 100% of his lunch   Possible Reasons for Event: Unknown    Comments/MD notified: [place], MD notified multiple times. Verbal order to start D5 0.45 NS @ 63ml/hr.  Pt asymptomatic, A/O x 4, sitting in chair comfortably.   1423:  CBG: [place]

## 2020-03-10 NOTE — Progress Notes (Addendum)
Pt's CBG has been stable, 85, 89, 107, 112. Pt's sleeping comfortably, requested to not wake him up for CBG.  Marlowe Sax, MD on call paged and notified.

## 2020-03-10 NOTE — Progress Notes (Signed)
Pt ambulated in hall with PT. On room air, oxygen saturation remained >93%. No complaints voiced other than slightly short of breath with execration. Pt tolerated well.

## 2020-03-10 NOTE — Progress Notes (Signed)
Occupational Therapy Treatment Patient Details Name: Jordan [name] MRN: [id number] DOB: 22-Apr-1951 Today's Date: 03/10/2020    History of present illness Pt is a 69 y/o male admited secondary to increased weakness and SOB. Thought to be from acute CHF. TAVR workup pending. PMH includes COPD, DM, HTN, and tobacco use.   OT comments  Pt progressing with OT goals demonstrating ADL mobility with RW at Supervision level. Continue to recommend AE for LB ADLs to maximize independence in which pt reports still interested in obtaining AE. Pt able to maintain >90% SpO2 on RA during activities today, HR sustained in 50s. HH therapy follow-up remains appropriate.    Follow Up Recommendations  Home health OT    Equipment Recommendations  Other (comment) (adaptive equipment for LB ADLs)    Recommendations for Other Services      Precautions / Restrictions Precautions Precautions: Fall Precaution Comments: watch 02 Restrictions Weight Bearing Restrictions: No       Mobility Bed Mobility               General bed mobility comments: up in recliner  Transfers Overall transfer level: Needs assistance Equipment used: Rolling walker (2 wheeled) Transfers: Sit to/from [employer] Transfers Sit to Stand: Supervision Stand pivot transfers: Supervision       General transfer comment: Supervision for safety, no physical assist needed    Balance Overall balance assessment: Needs assistance Sitting-balance support: Feet supported;No upper extremity supported Sitting balance-Leahy Scale: Good     Standing balance support: Single extremity supported;During functional activity Standing balance-Leahy Scale: Fair Standing balance comment: fair static standing getting hand sanitizer in hallway, use of DME for dynamic tasks                           ADL either performed or assessed with clinical judgement   ADL Overall ADL's : Needs assistance/impaired                      Lower Body Dressing: Maximal assistance;Sit to/from stand (without AE) Lower Body Dressing Details (indicate cue type and reason): Pt Max A to don socks without AE, unable to reach B feet Toilet Transfer: Supervision/safety;Ambulation;RW Toilet Transfer Details (indicate cue type and reason): Simulated in room with RW, no LOB, but did require frequent cues to stay close to RW         Functional mobility during ADLs: Supervision/safety;Rolling walker;Cueing for safety;Cueing for sequencing General ADL Comments: Pt with improving endurance, continued reinforcement needed for safe DME use during ADls     Vision   Vision Assessment?: No apparent visual deficits   Perception     Praxis      Cognition Arousal/Alertness: Awake/alert Behavior During Therapy: WFL for tasks assessed/performed Overall Cognitive Status: Within Functional Limits for tasks assessed                                          Exercises     Shoulder Instructions       General Comments Pt received on RA, 95% at start of session, 90% at end of session when returned to recliner chair. HR sustained in 50s    Pertinent Vitals/ Pain       Pain Assessment: No/denies pain  Home Living  Prior Functioning/Environment              Frequency  Min 2X/week        Progress Toward Goals  OT Goals(current goals can now be found in the care plan section)  Progress towards OT goals: Progressing toward goals  Acute Rehab OT Goals Patient Stated Goal: to go home OT Goal Formulation: With patient Time For Goal Achievement: 03/19/20 Potential to Achieve Goals: Good ADL Goals Pt Will Perform Grooming: with modified independence;standing Pt Will Perform Upper Body Bathing: with modified independence;sitting Pt Will Perform Lower Body Bathing: with modified independence;sit to/from stand Pt Will Perform Upper Body  Dressing: with modified independence;sitting Pt Will Perform Lower Body Dressing: with modified independence;sit to/from stand Pt Will Transfer to Toilet: with modified independence;ambulating Pt Will Perform Toileting - Clothing Manipulation and hygiene: with modified independence;sit to/from stand Pt Will Perform Tub/Shower Transfer: with modified independence;rolling walker;ambulating;shower seat  Plan Discharge plan remains appropriate    Co-evaluation                 AM-PAC OT "6 Clicks" Daily Activity     Outcome Measure   Help from another person eating meals?: None Help from another person taking care of personal grooming?: A Little Help from another person toileting, which includes using toliet, bedpan, or urinal?: A Little Help from another person bathing (including washing, rinsing, drying)?: A Little Help from another person to put on and taking off regular upper body clothing?: A Little Help from another person to put on and taking off regular lower body clothing?: A Lot 6 Click Score: 18    End of Session Equipment Utilized During Treatment: Gait belt;Rolling walker  OT Visit Diagnosis: Unsteadiness on feet (R26.81)   Activity Tolerance Patient tolerated treatment well   Patient Left in chair;with call bell/phone within reach   Nurse Communication Mobility status;Other (comment) (O2)        Time: 9407-6808 OT Time Calculation (min): 32 min  Charges: OT General Charges $OT Visit: 1 Visit OT Treatments $Self Care/Home Management : 8-22 mins $Therapeutic Activity: 8-22 mins  Layla Maw, OTR/L   Layla Maw 03/10/2020, 12:41 PM

## 2020-03-10 NOTE — Progress Notes (Addendum)
RN notified Kathlen Mody, PA that PFTs have not been completed for TAVR work-up. Kathlen Mody, [place] notified and stated these should be done prior to d/c. New order placed for PFTs and will follow-up to see if this test can be done today.

## 2020-03-10 NOTE — Progress Notes (Addendum)
Progress Note  Patient Name: Jordan [name] Date of Encounter: 03/10/2020  [facility] Cardiologist: Candee Furbish, MD   Subjective   Patient put out 1.5L urine overnight. CXR showed stable B/L pleural effusions. Might need thoracentesis. Patient feels breathing is the same as yesterday. Off O2 and oxygen levels are stable. No chest pain.  Inpatient Medications    Scheduled Meds: . aspirin EC  81 mg Oral Daily  . atorvastatin  80 mg Oral QPC supper  . budesonide (PULMICORT) nebulizer solution  0.25 mg Nebulization BID  . enoxaparin (LOVENOX) injection  40 mg Subcutaneous Q24H  . feeding supplement (ENSURE ENLIVE)  237 mL Oral BID BM  . folic acid  1 mg Oral Daily  . furosemide  40 mg Oral Daily  . hydrocerin   Topical TID  . multivitamin with minerals  1 tablet Oral Daily  . pantoprazole  40 mg Oral Daily  . sodium chloride flush  3 mL Intravenous Q12H  . sodium chloride flush  3 mL Intravenous Q12H  . venlafaxine XR  150 mg Oral QPC supper   Continuous Infusions: . sodium chloride    . lactated ringers 10 mL/hr at 03/09/20 1353   PRN Meds: sodium chloride, acetaminophen, butalbital-acetaminophen-caffeine, ipratropium-albuterol, LORazepam, ondansetron (ZOFRAN) IV, sodium chloride flush   Vital Signs    Vitals:   03/09/20 1635 03/09/20 2036 03/09/20 2139 03/10/20 0507  BP: (!) 120/48 (!) 109/44  122/60  Pulse: 73 (!) 58  61  Resp: (!) 22 15  16   Temp: (!) 96.9 F (36.1 C) 97.6 F (36.4 C)  98 F (36.7 C)  TempSrc: Axillary Axillary  Oral  SpO2: 93% 96% 97% 95%  Weight:    85.3 kg  Height:        Intake/Output Summary (Last 24 hours) at 03/10/2020 0742 Last data filed at 03/09/2020 2200 Gross per 24 hour  Intake 1003 ml  Output 1500 ml  Net -497 ml   Last 3 Weights 03/10/2020 03/09/2020 03/09/2020  Weight (lbs) 188 lb 1.6 oz 187 lb 9.8 oz 187 lb 11.2 oz  Weight (kg) 85.322 kg 85.1 kg 85.14 kg      Telemetry    SB, HR 50-60, no other arrhythmias  seen- Personally Reviewed  ECG    No new - Personally Reviewed  Physical Exam   GEN: No acute distress.   Neck: No JVD Cardiac: RRR, + murmur, no rubs, or gallops.  Respiratory: Diminished as bases bilaterally. GI: Soft, nontender, non-distended  MS: 1+ edema; No deformity. Neuro:  Nonfocal  Psych: Normal affect   Labs    High Sensitivity Troponin:  No results for input(s): TROPONINIHS in the last 720 hours.    Chemistry Recent Labs  Lab 03/03/20 1600 03/04/20 0440 03/08/20 0751 03/09/20 0255 03/10/20 0540  NA 133*   < > 135 136 133*  K 6.1*   < > 4.7 4.9 4.4  CL 97*   < > 100 100 97*  CO2 28   < > 29 28 27   GLUCOSE 87   < > 75 76 96  BUN 25*   < > 20 21 23   CREATININE 1.10   < > 0.97 1.10 1.23  CALCIUM 8.5*   < > 8.3* 8.0* 8.0*  PROT 5.6*  --   --   --   --   ALBUMIN 3.1*  --   --   --   --   AST 25  --   --   --   --  ALT 13  --   --   --   --   ALKPHOS 151*  --   --   --   --   BILITOT 1.3*  --   --   --   --   GFRNONAA >60   < > >60 >60 60*  GFRAA >60   < > >60 >60 >60  ANIONGAP 8   < > 6 8 9    < > = values in this interval not displayed.     Hematology Recent Labs  Lab 03/07/20 0458 03/08/20 0751 03/09/20 0255  WBC 6.8 7.5 7.0  RBC 4.12* 3.95* 4.03*  HGB 12.3* 11.7* 11.6*  HCT 39.1 37.4* 37.6*  MCV 94.9 94.7 93.3  MCH 29.9 29.6 28.8  MCHC 31.5 31.3 30.9  RDW 15.0 14.7 14.6  PLT 145* 144* 157    BNP Recent Labs  Lab 03/03/20 1600  BNP 2,454.0*     DDimer No results for input(s): DDIMER in the last 168 hours.   Radiology    DG Chest 2 View  Result Date: 03/09/2020 CLINICAL DATA:  Shortness of breath EXAM: CHEST - 2 VIEW COMPARISON:  03/07/2020 CT FINDINGS: Cardiac shadow is mildly enlarged. Bilateral pleural effusions and bibasilar atelectatic changes are noted stable from the prior exam. No new focal infiltrate is seen. No bony abnormality is noted. IMPRESSION: Bilateral effusions and adjacent atelectatic changes stable from the  prior CT. Electronically Signed   By: Inez Catalina M.D.   On: 03/09/2020 21:37   ECHO TEE  Result Date: 03/09/2020    TRANSESOPHOGEAL ECHO REPORT   Patient Name:   Jordan [name] Date of Exam: 03/09/2020 Medical Rec #:  [id number]         Height:       73.0 in Accession #:    [id number]        Weight:       187.7 lb Date of Birth:  December 26, 1950         BSA:          2.094 m Patient Age:    69 years          BP:           157/36 mmHg Patient Gender: M                 HR:           53 bpm. Exam Location:  Inpatient Procedure: Transesophageal Echo, Color Doppler, Cardiac Doppler and 3D Echo Indications:     Aortic valve disorder 424.1 / I35.9  History:         Patient has prior history of Echocardiogram examinations, most                  recent 03/04/2020. CHF, COPD and TIA; Risk Factors:Hypertension,                  Diabetes, Dyslipidemia, Current Smoker and Sleep Apnea. GERD.  Sonographer:     Vickie Epley RDCS Referring Phys:  [place] Diagnosing Phys: [name] MD PROCEDURE: The transesophogeal probe was passed without difficulty through the esophogus of the patient. Sedation performed by different physician. The patient was monitored while under deep sedation. Anesthestetic sedation was provided intravenously by Anesthesiology: 250.2mg  of Propofol. Patients was under conscious sedation during this procedure. Anesthetic administered: 88mcg of Fentanyl. The patient's vital signs; including heart rate, blood pressure, and oxygen saturation; remained stable throughout the procedure. The patient  developed no complications during the procedure. IMPRESSIONS  1. Moderately calcified and thickened aortic valve with moderately restricted leaflet openings, however transaortic gradients in the severe range (mean gradient 43 mmHg) secondary to high flow sec to severe aortic regurgitation.  2. Left ventricular ejection fraction, by estimation, is 60 to 65%. The left ventricle has normal function. The left  ventricle has no regional wall motion abnormalities.  3. Right ventricular systolic function is normal. The right ventricular size is normal.  4. Left atrial size was mildly dilated. No left atrial/left atrial appendage thrombus was detected.  5. The mitral valve is normal in structure. Trivial mitral valve regurgitation. No evidence of mitral stenosis.  6. The aortic valve is normal in structure. Aortic valve regurgitation is severe. Moderate to severe aortic valve stenosis. Aortic valve mean gradient measures 43.0 mmHg.  7. The inferior vena cava is normal in size with greater than 50% respiratory variability, suggesting right atrial pressure of 3 mmHg. Conclusion(s)/Recommendation(s): Normal biventricular function without evidence of hemodynamically significant valvular heart disease. FINDINGS  Left Ventricle: Left ventricular ejection fraction, by estimation, is 60 to 65%. The left ventricle has normal function. The left ventricle has no regional wall motion abnormalities. The left ventricular internal cavity size was normal in size. There is  no left ventricular hypertrophy. Right Ventricle: The right ventricular size is normal. No increase in right ventricular wall thickness. Right ventricular systolic function is normal. Left Atrium: Left atrial size was mildly dilated. No left atrial/left atrial appendage thrombus was detected. Right Atrium: Right atrial size was normal in size. Pericardium: There is no evidence of pericardial effusion. Mitral Valve: The mitral valve is normal in structure. Trivial mitral valve regurgitation. No evidence of mitral valve stenosis. Tricuspid Valve: The tricuspid valve is normal in structure. Tricuspid valve regurgitation is mild . No evidence of tricuspid stenosis. Aortic Valve: The aortic valve is normal in structure. Aortic valve regurgitation is severe. Aortic regurgitation PHT measures 424 msec. Moderate to severe aortic stenosis is present. Aortic valve mean gradient  measures 43.0 mmHg. Aortic valve peak gradient measures 73.8 mmHg. Pulmonic Valve: The pulmonic valve was normal in structure. Pulmonic valve regurgitation is not visualized. No evidence of pulmonic stenosis. Aorta: The aortic root is normal in size and structure. Venous: The inferior vena cava is normal in size with greater than 50% respiratory variability, suggesting right atrial pressure of 3 mmHg. IAS/Shunts: No atrial level shunt detected by color flow Doppler.  AORTIC VALVE AV Vmax:           429.50 cm/s AV Vmean:          301.500 cm/s AV VTI:            0.874 m AV Peak Grad:      73.8 mmHg AV Mean Grad:      43.0 mmHg LVOT Vmax:         88.90 cm/s LVOT Vmean:        59.250 cm/s LVOT VTI:          0.165 m LVOT/AV VTI ratio: 0.19 AI PHT:            424 msec  SHUNTS Systemic VTI: 0.16 m [name] MD Electronically signed by [name] MD Signature Date/Time: 03/09/2020/3:18:40 PM    Final (Updated)     Cardiac Studies   R/L heart cath 03/06/20 1. Nonobstructive coronary artery disease with mild nonobstructive plaquing in the RCA, wide patency of the left main, wide patency of the LAD,  and mild to moderate mid circumflex stenosis 2. Moderate aortic stenosis with mean gradient 26 mmHg and calculated aortic valve area 1.6 cm. 3. Moderate pulmonary hypertension with mean PA pressure 34 mmHg, transpulmonary gradient 13 mmHg, PVR less than 2 Wood units  Echo 03/04/20 1. Left ventricular ejection fraction, by estimation, is 55 to 60%. The  left ventricle has normal function. The left ventricle has no regional  wall motion abnormalities. Left ventricular diastolic function could not  be evaluated.  2. Right ventricular systolic function is normal. The right ventricular  size is normal.  3. Left atrial size was moderately dilated.  4. Right atrial size was mildly dilated.  5. Large pleural effusion in the left lateral region.  6. The mitral valve is normal in structure. No evidence  of mitral valve  regurgitation.  7. The aortic valve is grossly normal. There is moderate calcification of  the aortic valve. There is moderate thickening of the aortic valve. Aortic  valve regurgitation is mild. Moderate to severe aortic valve stenosis.   Patient Profile     69 y.o. male with pmh of COPD, DM2, HTN, obesity, OSA on CPAP who presented with acute CHF now undergoing TAVR work-up.   Assessment & Plan    Severe AS - Undergoing TAVR work-up per Dr. Burt Knack - CT chest/abd/pelvis showed severe calcification of arotic valve with severe AS - Cath and echo as above -  TEE yesterday - Plan for discharge once TAVR work-up is complete  Acute on chronic diastolic CHF - Echo with EF 55-60% and large left pleural effusion - IV lasix held 03/05/20 for rising creatinine - Net 6.6L - Weight down 225lbs>>187lbs - creatinine improved and he was started on home lasix dose 40 mg daily.  - creatinine up from yesterday 1.1>1.23  - Has some lower leg edema. Might need extra dose of lasix for a couple days - CXR showed stable bilateral pleural effusions. Patient off O2 in the room with sats at 95% however still feels a little sob. Will discuss thoracentesis with MD.   Bradycardia - Atenolol held - Heart rates 50-60s. Unsure with restarting BB since he is bradycardic at baseline  HTN - BB and [place] held - PTA hydralazine, also held - Pressures labile, this AM 156/51  HLD - atorvastatin    For questions or updates, please contact [facility] Please consult [email or web address] for contact info under        Signed, Cadence Ninfa Meeker, PA-C  03/10/2020, 7:42 AM    History and all data above reviewed.  Patient examined.  I agree with the findings as above.  He says that he is breathing much better but not quite at baseline.  He is 94% on RA.   The patient exam reveals COR:RRR  ,  Lungs: Fairly clear  ,  Abd: Positive bowel sounds, no rebound no guarding, Ext Wrapped with mild/mod  edema  .  All available labs, radiology testing, previous records reviewed. Agree with documented assessment and plan.   Acute diastolic HF:  I have asked the nurses to ambulate on RA.  I do not think that he needs thoracentesis.  I did review the CXR and he has effusions.  However, if he is oxygenating well with ambulation we coud continue with PO diuresis.  AS:  I am communicating with [facility] to see if they have any other studies they need prior to discharge.    Jeneen Rinks Maalik Pinn  11:29 AM  03/10/2020

## 2020-03-10 NOTE — Progress Notes (Signed)
SATURATION QUALIFICATIONS: (This note is used to comply with regulatory documentation for home oxygen)  Patient Saturations on Room Air at Rest = 95%  Patient Saturations on Room Air while Ambulating = 93%  Patient Saturations on N/A Liters of oxygen while Ambulating = N.A  Please briefly explain why patient needs home oxygen: Pt does not need oxygen to maintain >90% SpO2 stats.

## 2020-03-10 NOTE — Progress Notes (Signed)
Patient CPAP use HS. Patient aware to call for Respiratory if he changes his mind.

## 2020-03-10 NOTE — Progress Notes (Addendum)
Hypoglycemic Event  CBG: 67  Treatment: 4 oz of apple juice given   Symptoms: Asymptomatic   Follow-up CBG: Time:1745 CBG Result:40  Symptoms: Asymptomatic  Treatment: 8oz of juice, currently eating dinner tray  Follow up CBG: Time: 1810 CBG Result:112  Possible Reasons for Event: Unknown  Comments/MD notified: Given apple juice and ordered food tray  Verbal Order received from [place], MD to restart D5 0.45% NS at 50 mL/hr and to recheck CBG q1hr.    Jordan [name] Jordan [name]

## 2020-03-10 NOTE — Progress Notes (Addendum)
[place] VALVE TEAM  Patient Name: Jordan [name] Date of Encounter: 03/10/2020  Primary Cardiologist: Dr. Cliffton Asters Problem List     Active Problems:   Essential hypertension, benign   Diabetes ([place])   HLD (hyperlipidemia)   Chronic venous insufficiency   Acute on chronic congestive heart failure (HCC)   Tobacco abuse   Protein-calorie malnutrition, severe     Subjective   No complaints. Says he is breathing better.   Inpatient Medications    Scheduled Meds: . aspirin EC  81 mg Oral Daily  . atorvastatin  80 mg Oral QPC supper  . budesonide (PULMICORT) nebulizer solution  0.25 mg Nebulization BID  . enoxaparin (LOVENOX) injection  40 mg Subcutaneous Q24H  . feeding supplement (ENSURE ENLIVE)  237 mL Oral BID BM  . folic acid  1 mg Oral Daily  . furosemide  40 mg Oral Daily  . hydrocerin   Topical TID  . multivitamin with minerals  1 tablet Oral Daily  . pantoprazole  40 mg Oral Daily  . sodium chloride flush  3 mL Intravenous Q12H  . sodium chloride flush  3 mL Intravenous Q12H  . venlafaxine XR  150 mg Oral QPC supper   Continuous Infusions: . sodium chloride    . lactated ringers 10 mL/hr at 03/09/20 1353   PRN Meds: sodium chloride, acetaminophen, butalbital-acetaminophen-caffeine, ipratropium-albuterol, LORazepam, ondansetron (ZOFRAN) IV, sodium chloride flush   Vital Signs    Vitals:   03/09/20 2139 03/10/20 0507 03/10/20 0745 03/10/20 0816  BP:  122/60 (!) 156/51   Pulse:  61 62   Resp:  16    Temp:  98 F (36.7 C) 98.5 F (36.9 C)   TempSrc:  Oral Oral   SpO2: 97% 95%  95%  Weight:  85.3 kg    Height:        Intake/Output Summary (Last 24 hours) at 03/10/2020 1008 Last data filed at 03/10/2020 0821 Gross per 24 hour  Intake 1309 ml  Output 1500 ml  Net -191 ml   Filed Weights   03/09/20 0531 03/09/20 1326 03/10/20 0507  Weight: 85.1 kg 85.1 kg 85.3 kg    Physical Exam   GEN: Well  nourished, well developed, in no acute distress. Older appearing than stated age 24: Grossly normal.  Neck: Supple, no JVD, 2+ bilateral carotid bruits, or masses. Cardiac: RRR, 3/6  SEM . No rubs, or gallops. No clubbing, cyanosis. Wearing unna boots with 2+ LE edema.  Respiratory: decreased breath sounds. GI: Soft, nontender, nondistended, BS + x 4. MS: no deformity or atrophy. Skin: warm and dry, no rash. Neuro:  Strength and sensation are intact. Psych: AAOx3.  Normal affect.  Labs    CBC Recent Labs    03/08/20 0751 03/09/20 0255  WBC 7.5 7.0  HGB 11.7* 11.6*  HCT 37.4* 37.6*  MCV 94.7 93.3  PLT 144* 696   Basic Metabolic Panel Recent Labs    03/09/20 0255 03/10/20 0540  NA 136 133*  K 4.9 4.4  CL 100 97*  CO2 28 27  GLUCOSE 76 96  BUN 21 23  CREATININE 1.10 1.23  CALCIUM 8.0* 8.0*   Liver Function Tests No results for input(s): AST, ALT, ALKPHOS, BILITOT, PROT, ALBUMIN in the last 72 hours. No results for input(s): LIPASE, AMYLASE in the last 72 hours. Cardiac Enzymes No results for input(s): CKTOTAL, CKMB, CKMBINDEX, TROPONINI in the last 72 hours. BNP Invalid input(s): POCBNP  D-Dimer No results for input(s): DDIMER in the last 72 hours. Hemoglobin A1C No results for input(s): HGBA1C in the last 72 hours. Fasting Lipid Panel No results for input(s): CHOL, HDL, LDLCALC, TRIG, CHOLHDL, LDLDIRECT in the last 72 hours. Thyroid Function Tests No results for input(s): TSH, T4TOTAL, T3FREE, THYROIDAB in the last 72 hours.  Invalid input(s): FREET3  Telemetry    Sinus bradycardia - Personally Reviewed  ECG    Sinus brady HR 59 - Personally Reviewed  Radiology    DG Chest 2 View  Result Date: 03/09/2020 CLINICAL DATA:  Shortness of breath EXAM: CHEST - 2 VIEW COMPARISON:  03/07/2020 CT FINDINGS: Cardiac shadow is mildly enlarged. Bilateral pleural effusions and bibasilar atelectatic changes are noted stable from the prior exam. No new focal  infiltrate is seen. No bony abnormality is noted. IMPRESSION: Bilateral effusions and adjacent atelectatic changes stable from the prior CT. Electronically Signed   By: Inez Catalina M.D.   On: 03/09/2020 21:37   ECHO TEE  Result Date: 03/09/2020    TRANSESOPHOGEAL ECHO REPORT   Patient Name:   Jordan [name] Date of Exam: 03/09/2020 Medical Rec #:  [id number]         Height:       73.0 in Accession #:    [id number]        Weight:       187.7 lb Date of Birth:  August 23, 1950         BSA:          2.094 m Patient Age:    69 years          BP:           157/36 mmHg Patient Gender: M                 HR:           53 bpm. Exam Location:  Inpatient Procedure: Transesophageal Echo, Color Doppler, Cardiac Doppler and 3D Echo Indications:     Aortic valve disorder 424.1 / I35.9  History:         Patient has prior history of Echocardiogram examinations, most                  recent 03/04/2020. CHF, COPD and TIA; Risk Factors:Hypertension,                  Diabetes, Dyslipidemia, Current Smoker and Sleep Apnea. GERD.  Sonographer:     Vickie Epley RDCS Referring Phys:  [place] Diagnosing Phys: [name] MD PROCEDURE: The transesophogeal probe was passed without difficulty through the esophogus of the patient. Sedation performed by different physician. The patient was monitored while under deep sedation. Anesthestetic sedation was provided intravenously by Anesthesiology: 250.2mg  of Propofol. Patients was under conscious sedation during this procedure. Anesthetic administered: 45mcg of Fentanyl. The patient's vital signs; including heart rate, blood pressure, and oxygen saturation; remained stable throughout the procedure. The patient developed no complications during the procedure. IMPRESSIONS  1. Moderately calcified and thickened aortic valve with moderately restricted leaflet openings, however transaortic gradients in the severe range (mean gradient 43 mmHg) secondary to high flow sec to severe aortic  regurgitation.  2. Left ventricular ejection fraction, by estimation, is 60 to 65%. The left ventricle has normal function. The left ventricle has no regional wall motion abnormalities.  3. Right ventricular systolic function is normal. The right ventricular size is normal.  4. Left atrial size was mildly dilated. No  left atrial/left atrial appendage thrombus was detected.  5. The mitral valve is normal in structure. Trivial mitral valve regurgitation. No evidence of mitral stenosis.  6. The aortic valve is normal in structure. Aortic valve regurgitation is severe. Moderate to severe aortic valve stenosis. Aortic valve mean gradient measures 43.0 mmHg.  7. The inferior vena cava is normal in size with greater than 50% respiratory variability, suggesting right atrial pressure of 3 mmHg. Conclusion(s)/Recommendation(s): Normal biventricular function without evidence of hemodynamically significant valvular heart disease. FINDINGS  Left Ventricle: Left ventricular ejection fraction, by estimation, is 60 to 65%. The left ventricle has normal function. The left ventricle has no regional wall motion abnormalities. The left ventricular internal cavity size was normal in size. There is  no left ventricular hypertrophy. Right Ventricle: The right ventricular size is normal. No increase in right ventricular wall thickness. Right ventricular systolic function is normal. Left Atrium: Left atrial size was mildly dilated. No left atrial/left atrial appendage thrombus was detected. Right Atrium: Right atrial size was normal in size. Pericardium: There is no evidence of pericardial effusion. Mitral Valve: The mitral valve is normal in structure. Trivial mitral valve regurgitation. No evidence of mitral valve stenosis. Tricuspid Valve: The tricuspid valve is normal in structure. Tricuspid valve regurgitation is mild . No evidence of tricuspid stenosis. Aortic Valve: The aortic valve is normal in structure. Aortic valve regurgitation  is severe. Aortic regurgitation PHT measures 424 msec. Moderate to severe aortic stenosis is present. Aortic valve mean gradient measures 43.0 mmHg. Aortic valve peak gradient measures 73.8 mmHg. Pulmonic Valve: The pulmonic valve was normal in structure. Pulmonic valve regurgitation is not visualized. No evidence of pulmonic stenosis. Aorta: The aortic root is normal in size and structure. Venous: The inferior vena cava is normal in size with greater than 50% respiratory variability, suggesting right atrial pressure of 3 mmHg. IAS/Shunts: No atrial level shunt detected by color flow Doppler.  AORTIC VALVE AV Vmax:           429.50 cm/s AV Vmean:          301.500 cm/s AV VTI:            0.874 m AV Peak Grad:      73.8 mmHg AV Mean Grad:      43.0 mmHg LVOT Vmax:         88.90 cm/s LVOT Vmean:        59.250 cm/s LVOT VTI:          0.165 m LVOT/AV VTI ratio: 0.19 AI PHT:            424 msec  SHUNTS Systemic VTI: 0.16 m [name] MD Electronically signed by [name] MD Signature Date/Time: 03/09/2020/3:18:40 PM    Final (Updated)     Cardiac Studies   R/L heart cath 03/06/20 1. Nonobstructive coronary artery disease with mild nonobstructive plaquing in the RCA, wide patency of the left main, wide patency of the LAD, and mild to moderate mid circumflex stenosis 2. Moderate aortic stenosis with mean gradient 26 mmHg and calculated aortic valve area 1.6 cm. 3. Moderate pulmonary hypertension with mean PA pressure 34 mmHg, transpulmonary gradient 13 mmHg, PVR less than 2 Wood units  _______________   Echo 03/04/20 1. Left ventricular ejection fraction, by estimation, is 55 to 60%. The  left ventricle has normal function. The left ventricle has no regional  wall motion abnormalities. Left ventricular diastolic function could not  be evaluated.  2. Right ventricular systolic  function is normal. The right ventricular  size is normal.  3. Left atrial size was moderately dilated.  4.  Right atrial size was mildly dilated.  5. Large pleural effusion in the left lateral region.  6. The mitral valve is normal in structure. No evidence of mitral valve  regurgitation.  7. The aortic valve is grossly normal. There is moderate calcification of  the aortic valve. There is moderate thickening of the aortic valve. Aortic  valve regurgitation is mild. Moderate to severe aortic valve stenosis.    _______________  TEE 03/09/20 IMPRESSIONS 1. Moderately calcified and thickened aortic valve with moderately  restricted leaflet openings, however transaortic gradients in the severe  range (mean gradient 43 mmHg) secondary to high flow sec to severe aortic  regurgitation.  2. Left ventricular ejection fraction, by estimation, is 60 to 65%. The  left ventricle has normal function. The left ventricle has no regional  wall motion abnormalities.  3. Right ventricular systolic function is normal. The right ventricular  size is normal.  4. Left atrial size was mildly dilated. No left atrial/left atrial  appendage thrombus was detected.  5. The mitral valve is normal in structure. Trivial mitral valve  regurgitation. No evidence of mitral stenosis.  6. The aortic valve is normal in structure. Aortic valve regurgitation is  severe. Moderate to severe aortic valve stenosis. Aortic valve mean  gradient measures 43.0 mmHg.  7. The inferior vena cava is normal in size with greater than 50%  respiratory variability, suggesting right atrial pressure of 3 mmHg.   Conclusion(s)/Recommendation(s): Normal biventricular function without  evidence of hemodynamically significant valvular heart disease.   FINDINGS  Left Ventricle: Left ventricular ejection fraction, by estimation, is 60  to 65%. The left ventricle has normal function. The left ventricle has no  regional wall motion abnormalities. The left ventricular internal cavity  size was normal in size. There is  no left  ventricular hypertrophy.   Right Ventricle: The right ventricular size is normal. No increase in  right ventricular wall thickness. Right ventricular systolic function is  normal.   Left Atrium: Left atrial size was mildly dilated. No left atrial/left  atrial appendage thrombus was detected.   Right Atrium: Right atrial size was normal in size.   Pericardium: There is no evidence of pericardial effusion.   Mitral Valve: The mitral valve is normal in structure. Trivial mitral  valve regurgitation. No evidence of mitral valve stenosis.   Tricuspid Valve: The tricuspid valve is normal in structure. Tricuspid  valve regurgitation is mild . No evidence of tricuspid stenosis.   Aortic Valve: The aortic valve is normal in structure. Aortic valve  regurgitation is severe. Aortic regurgitation PHT measures 424 msec.  Moderate to severe aortic stenosis is present. Aortic valve mean gradient  measures 43.0 mmHg. Aortic valve peak  gradient measures 73.8 mmHg.   Pulmonic Valve: The pulmonic valve was normal in structure. Pulmonic valve  regurgitation is not visualized. No evidence of pulmonic stenosis.   Aorta: The aortic root is normal in size and structure.   Venous: The inferior vena cava is normal in size with greater than 50%  respiratory variability, suggesting right atrial pressure of 3 mmHg.   IAS/Shunts: No atrial level shunt detected by color flow Doppler.     AORTIC VALVE  AV Vmax:      429.50 cm/s  AV Vmean:     301.500 cm/s  AV VTI:      0.874 m  AV Peak  Grad:   73.8 mmHg  AV Mean Grad:   43.0 mmHg  LVOT Vmax:     88.90 cm/s  LVOT Vmean:    59.250 cm/s  LVOT VTI:     0.165 m  LVOT/AV VTI ratio: 0.19  AI PHT:      424 msec     SHUNTS  Systemic VTI: 0.16 m    __________________   Cardiac CT 03/07/20 ADDENDUM REPORT: 03/09/2020 16:59  CLINICAL DATA:  69 year old male with severe aortic stenosis being evaluated for a  TAVR procedure.  EXAM: Cardiac TAVR CT  TECHNIQUE: The patient was scanned on a [employer]. A 120 kV retrospective scan was triggered in the descending thoracic aorta at 111 HU's. Gantry rotation speed was 250 msecs and collimation was .6 mm. No beta blockade or nitro were given. The 3D data set was reconstructed in 5% intervals of the R-R cycle. Systolic and diastolic phases were analyzed on a dedicated work station using MPR, MIP and VRT modes. The patient received 80 cc of contrast.  FINDINGS: Aortic Valve: Trileaflet with severely calcified leaflets with moderately restricted leaflet opening and no calcifications extending into the LVOT.  Aorta: Normal size with mild diffuse atherosclerotic plaque and calcifications and no dissection.  Sinotubular Junction: 26 x 25 mm  Ascending Thoracic Aorta: 33 x 32 mm  Aortic Arch: 26 x 25 mm  Descending Thoracic Aorta: 28 x 28 mm  Sinus of Valsalva Measurements:  Non-coronary: 31 mm  Right -coronary: 31 mm  Left -coronary: 34 mm  Coronary Artery Height above Annulus:  Left Main: 15 mm  Right Coronary: 16 mm  Virtual Basal Annulus Measurements:  Maximum/Minimum Diameter: 29.3 x 22.9 mm  Mean Diameter: 26.0 mm  Perimeter: 84 mm  Area: 532 mm2  Optimum Fluoroscopic Angle for Delivery: RAO 3 CRA 4.  IMPRESSION: 1. Trileaflet aortic valve with severely calcified leaflets with moderately restricted leaflet opening and no calcifications extending into the LVOT. Aortic valve calcium score 2271 consistent with severe aortic stenosis. Annular measurements suitable for delivery of 26 mm Edwards-SAPIEN 3 Ultra valve.  2. Sufficient coronary to annulus distance.  3. Optimum Fluoroscopic Angle for Delivery: RAO 3 CRA 4.  4. No thrombus in the left atrial appendage.   _________________  CTA chest/abd/pelvis 03/07/20 Narrative & Impression  CLINICAL DATA:  Inpatient. Severe  aortic stenosis. Acute on chronic heart failure. TAVR evaluation.  EXAM: CT ANGIOGRAPHY CHEST, ABDOMEN AND PELVIS  TECHNIQUE: Multidetector CT imaging through the chest, abdomen and pelvis was performed using the standard protocol during bolus administration of intravenous contrast. Multiplanar reconstructed images and MIPs were obtained and reviewed to evaluate the vascular anatomy.  CONTRAST:  134mL OMNIPAQUE IOHEXOL 350 MG/ML SOLN  COMPARISON:  03/04/2020 chest radiograph.  FINDINGS: CTA CHEST FINDINGS  Cardiovascular: Mild cardiomegaly. No significant pericardial effusion/thickening. Three-vessel coronary atherosclerosis. Atherosclerotic nonaneurysmal thoracic aorta, including ascending thoracic aortic atherosclerotic calcification. Diffuse thickening and coarse calcification of the aortic valve. Normal caliber pulmonary arteries. No central pulmonary emboli.  Mediastinum/Nodes: No discrete thyroid nodules. Unremarkable esophagus. No axillary adenopathy. Mild right paratracheal adenopathy up to 1.4 cm (series 6/image 60). Mildly enlarged 1.1 cm subcarinal node (series 6/image 70). Mild AP window adenopathy up to 1.3 cm (series 6/image 59). Mild bilateral hilar adenopathy up to 1.4 cm on the right (series 6/image 66) and 1.2 cm on the left (series 6/image 65).  Lungs/Pleura: No pneumothorax. Moderate dependent bilateral pleural effusions. Moderate dependent bilateral lower lobe passive atelectasis. Mild diffuse interlobular septal  thickening. No acute consolidative airspace disease, lung masses or significant pulmonary nodules.  Musculoskeletal: No aggressive appearing focal osseous lesions. Incompletely healed posterior right ninth through twelfth rib fractures. Moderate thoracic spondylosis.  CTA ABDOMEN AND PELVIS FINDINGS  Hepatobiliary: Normal liver with no liver mass. Normal gallbladder with no radiopaque cholelithiasis. No biliary ductal  dilatation.  Pancreas: Normal, with no mass or duct dilation.  Spleen: Normal size. No mass.  Adrenals/Urinary Tract: Normal adrenals. No hydronephrosis. Simple 2.5 cm anterior upper left renal cyst. Additional subcentimeter hypodense renal cortical lesions scattered in both kidneys are too small to characterize and require no follow-up. Excreted contrast in the bladder. Mild diffuse bladder wall thickening and trabeculation with tiny bladder diverticula.  Stomach/Bowel: Normal non-distended stomach. Normal caliber small bowel with no small bowel wall thickening. Normal appendix. Moderate colorectal stool. No large bowel wall thickening, diverticulosis or significant pericolonic fat stranding.  Vascular/Lymphatic: Atherosclerotic nonaneurysmal abdominal aorta. Mild porta hepatis adenopathy up to 1.3 cm (series 6/image 119). No pelvic adenopathy.  Reproductive: Top-normal size prostate.  Other: No pneumoperitoneum, ascites or focal fluid collection.  Musculoskeletal: No aggressive appearing focal osseous lesions. Marked lumbar spondylosis.  VASCULAR MEASUREMENTS PERTINENT TO TAVR:  AORTA:  Minimal Aortic Diameter-17.3 x 17.2 mm  Severity of Aortic Calcification-moderate  RIGHT PELVIS:  Right Common Iliac Artery -  Minimal Diameter-8.1 x 6.7 mm  Tortuosity-moderate  Calcification-severe  Right External Iliac Artery -  Minimal Diameter-8.4 x 7.8 mm  Tortuosity-moderate  Calcification-mild-to-moderate  Right Common Femoral Artery -  Minimal Diameter-9.5 x 8.0 mm  Tortuosity-mild  Calcification-severe  LEFT PELVIS:  Left Common Iliac Artery -  Minimal Diameter-8.3 x 7.2 mm  Tortuosity-moderate  Calcification-severe  Left External Iliac Artery -  Minimal Diameter-7.6 x 6.8 mm  Tortuosity-mild-to-moderate  Calcification-moderate  Left Common Femoral Artery -  Minimal Diameter-9.9 x 8.0  mm  Tortuosity-mild  Calcification-moderate  Review of the MIP images confirms the above findings.  IMPRESSION: 1. Vascular findings and measurements pertinent to potential TAVR procedure, as detailed. 2. Diffuse thickening and coarse calcification of the aortic valve, compatible with the reported history of severe aortic stenosis. 3. Mild cardiomegaly. Three-vessel coronary atherosclerosis. 4. Moderate dependent bilateral pleural effusions. 5. Mild diffuse interlobular septal thickening, compatible with mild pulmonary edema. 6. Mild porta hepatis, mild mediastinal and mild bilateral hilar lymphadenopathy, nonspecific, potentially reactive. Suggest follow-up CT chest, abdomen and pelvis with IV contrast in 3 months. 7. Mild diffuse bladder wall thickening and trabeculation with tiny bladder diverticula, suggesting nonspecific chronic bladder voiding dysfunction. 8. Moderate colorectal stool, suggesting constipation. 9. Incompletely healed posterior right ninth through twelfth rib fractures. 10. Aortic Atherosclerosis (ICD10-I70.0).     Patient Profile     Jordan [name] is a 69 y.o. male with a history of COPD, DM2, HTN, obesity, OSA on CPAP who presented to [facility] on [facility]/03/20 with worsening dyspnea. He was found to have acute CHF as well as severe AI with moderate AS and undergoing TAVR work up.   Assessment & Plan    Severe AI/moderate AS: confirmed by TEE yesterday which showed moderately calcified and thickened aortic valve with moderately restricted leaflet openings, however transaortic gradients in the severe range (mean gradient 43 mmHg) secondary to high flow sec to severe aortic regurgitation. Cardiac gated CTA of the heart reveals anatomical characteristics consistent with aortic stenosis suitable for treatment by transcatheter aortic valve replacement without any significant complicating features and CTA of the aorta and iliac vessels demonstrate what appear to be  adequate pelvic  vascular access to facilitate a transfemoral approach. He sizes to a size 26 mm Edwards Sapien S3U valve. Will continue the rest of TAVR vs SAVR work up including cardiothoracic surgery consult in the outpatient setting (Apt arranged with Dr. Cyndia Bent 10/20)  Acute on chronic diastolic CHF: echo with EF 55-60% and large left pleural effusion. CXR 9/20 showed stable bilateral pleural effusions. Primary team was considering thoracentesis but decided against it given that he is oxygenating well with ambulation.   Poor dentition: pt underwent orthopantogram during admission. Will plan for outpatient dental consultation + likely dental extractions as he has extremely poor dentition.   COPD: we have arranged for full PFTs to be done during this admission (which will be done tomorrow).    Signed, Angelena Form, PA-C  03/10/2020, 10:08 AM  Pager [phone]  Patient seen, examined. Available data reviewed. Agree with findings, assessment, and plan as outlined by Nell Range, PA-C.  The patient is independently interviewed and examined.  He continues to have some shortness of breath, but overall he is significantly improved from the time of admission.  I have reviewed all of his CTA data.  I have also reviewed his TEE images.  The patient has severe mixed aortic valve disease with severe aortic insufficiency and stenosis.  He is approaching medical stability for hospital discharge.  He will need extraction of his remaining teeth before proceeding with definitive treatment of his aortic valve disease. He plans to use Dr Hoyt Koch. He will see Dr. Cyndia Bent as an outpatient and this appointment is arranged.  He appears to have suitable anatomy for transfemoral TAVR if we decide to treat him with that approach.  Sherren Mocha, M.D. 03/10/2020 5:34 PM

## 2020-03-11 ENCOUNTER — Encounter (HOSPITAL_COMMUNITY): Payer: Self-pay | Admitting: Cardiology

## 2020-03-11 ENCOUNTER — Inpatient Hospital Stay (HOSPITAL_COMMUNITY): Payer: Medicare PPO

## 2020-03-11 LAB — CBC
HCT: 39.3 % (ref 39.0–52.0)
Hemoglobin: 12.1 g/dL — ABNORMAL LOW (ref 13.0–17.0)
MCH: 28.5 pg (ref 26.0–34.0)
MCHC: 30.8 g/dL (ref 30.0–36.0)
MCV: 92.5 fL (ref 80.0–100.0)
Platelets: 170 10*3/uL (ref 150–400)
RBC: 4.25 MIL/uL (ref 4.22–5.81)
RDW: 14.8 % (ref 11.5–15.5)
WBC: 6.8 10*3/uL (ref 4.0–10.5)
nRBC: 0 % (ref 0.0–0.2)

## 2020-03-11 LAB — PULMONARY FUNCTION TEST
DL/VA % pred: 83 %
DL/VA: 3.35 ml/min/mmHg/L
DLCO cor % pred: 45 %
DLCO cor: 13.08 ml/min/mmHg
DLCO unc % pred: 41 %
DLCO unc: 11.82 ml/min/mmHg
FEF 25-75 Pre: 0.51 L/sec
FEF2575-%Pred-Pre: 18 %
FEV1-%Pred-Pre: 33 %
FEV1-Pre: 1.23 L
FEV1FVC-%Pred-Pre: 67 %
FEV6-%Pred-Pre: 48 %
FEV6-Pre: 2.3 L
FEV6FVC-%Pred-Pre: 97 %
FVC-%Pred-Pre: 49 %
FVC-Pre: 2.49 L
Pre FEV1/FVC ratio: 49 %
Pre FEV6/FVC Ratio: 93 %
RV % pred: 137 %
RV: 3.57 L
TLC % pred: 78 %
TLC: 6.01 L

## 2020-03-11 LAB — BASIC METABOLIC PANEL
Anion gap: 10 (ref 5–15)
BUN: 17 mg/dL (ref 8–23)
CO2: 29 mmol/L (ref 22–32)
Calcium: 8.3 mg/dL — ABNORMAL LOW (ref 8.9–10.3)
Chloride: 96 mmol/L — ABNORMAL LOW (ref 98–111)
Creatinine, Ser: 1.18 mg/dL (ref 0.61–1.24)
GFR calc Af Amer: >60 mL/min (ref >60)
GFR calc non Af Amer: >60 mL/min (ref >60)
Glucose, Bld: 100 mg/dL — ABNORMAL HIGH (ref 70–99)
Potassium: 4.3 mmol/L (ref 3.5–5.1)
Sodium: 135 mmol/L (ref 135–145)

## 2020-03-11 LAB — GLUCOSE, CAPILLARY
Glucose-Capillary: 88 mg/dL (ref 70–99)
Glucose-Capillary: 42 mg/dL — CL (ref 70–99)
Glucose-Capillary: 74 mg/dL (ref 70–99)
Glucose-Capillary: 108 mg/dL — ABNORMAL HIGH (ref 70–99)
Glucose-Capillary: 91 mg/dL (ref 70–99)
Glucose-Capillary: 94 mg/dL (ref 70–99)
Glucose-Capillary: 122 mg/dL — ABNORMAL HIGH (ref 70–99)
Glucose-Capillary: 114 mg/dL — ABNORMAL HIGH (ref 70–99)
Glucose-Capillary: 87 mg/dL (ref 70–99)
Glucose-Capillary: 134 mg/dL — ABNORMAL HIGH (ref 70–99)

## 2020-03-11 LAB — C-PEPTIDE: C-Peptide: 14.9 ng/mL — ABNORMAL HIGH (ref 1.1–4.4)

## 2020-03-11 LAB — INSULIN, RANDOM: Insulin: 114 u[IU]/mL — ABNORMAL HIGH (ref 2.6–24.9)

## 2020-03-11 MED ORDER — INSULIN ASPART 100 UNIT/ML ~~LOC~~ SOLN: 0.0000 [IU] | SUBCUTANEOUS | Status: DC

## 2020-03-11 MED ORDER — FUROSEMIDE 10 MG/ML IJ SOLN
40.0000 mg | Freq: Once | INTRAMUSCULAR | Status: AC
  Administered 2020-03-11: 40 mg via INTRAVENOUS
  Filled 2020-03-11: qty 4

## 2020-03-11 NOTE — Progress Notes (Addendum)
Progress Note  Patient Name: Jordan [name] Date of Encounter: 03/11/2020  [facility] Cardiologist: Candee Furbish, MD   Subjective   Patient put out 2.8L urine overnight. PFTs this AM. Per Dr. Antionette Char note no further testing during admission for TAVR work-up. Tele shows brief episode of SVT otherwise SR. Some new course breath sounds on exam. Will check CXR. LLE on exam.   Inpatient Medications    Scheduled Meds:  aspirin EC  81 mg Oral Daily   atorvastatin  80 mg Oral QPC supper   budesonide (PULMICORT) nebulizer solution  0.25 mg Nebulization BID   dextrose  25 g Intravenous STAT   enoxaparin (LOVENOX) injection  40 mg Subcutaneous Q24H   feeding supplement (ENSURE ENLIVE)  237 mL Oral BID BM   folic acid  1 mg Oral Daily   furosemide  40 mg Oral Daily   hydrALAZINE  25 mg Oral Q8H   hydrocerin   Topical TID   multivitamin with minerals  1 tablet Oral Daily   pantoprazole  40 mg Oral Daily   sodium chloride flush  3 mL Intravenous Q12H   sodium chloride flush  3 mL Intravenous Q12H   tamsulosin  0.4 mg Oral Daily   venlafaxine XR  150 mg Oral QPC supper   Continuous Infusions:  sodium chloride     dextrose 5 % and 0.45% NaCl 50 mL/hr at 03/10/20 1818   lactated ringers 10 mL/hr at 03/09/20 1353   PRN Meds: sodium chloride, acetaminophen, butalbital-acetaminophen-caffeine, ipratropium-albuterol, LORazepam, ondansetron (ZOFRAN) IV, sodium chloride flush   Vital Signs    Vitals:   03/10/20 2009 03/10/20 2125 03/11/20 0440 03/11/20 0545  BP: (!) 127/51 (!) 118/41 132/62   Pulse: 69 64 65   Resp: 18 20 20    Temp: 98 F (36.7 C)  98.3 F (36.8 C)   TempSrc: Oral  Oral   SpO2: 97% 97% 95%   Weight:    85.4 kg  Height:        Intake/Output Summary (Last 24 hours) at 03/11/2020 0627 Last data filed at 03/11/2020 0447 Gross per 24 hour  Intake 1152.01 ml  Output 2850 ml  Net -1697.99 ml   Last 3 Weights 03/11/2020 03/10/2020 03/09/2020   Weight (lbs) 188 lb 3.2 oz 188 lb 1.6 oz 187 lb 9.8 oz  Weight (kg) 85.367 kg 85.322 kg 85.1 kg      Telemetry    NSR, HR 60s, Brief SVT  - Personally Reviewed  ECG    No new - Personally Reviewed  Physical Exam   GEN: No acute distress.   Neck: No JVD Cardiac: RRR, no murmurs, rubs, or gallops.  Respiratory: Course breath souns R>L, minimal wheezing GI: Soft, nontender, non-distended  MS: 1+ b/l edema; No deformity. Neuro:  Nonfocal  Psych: Normal affect   Labs    High Sensitivity Troponin:  No results for input(s): TROPONINIHS in the last 720 hours.    Chemistry Recent Labs  Lab 03/08/20 0751 03/08/20 0751 03/09/20 0255 03/10/20 0540 03/10/20 1415  NA 135  --  136 133*  --   K 4.7  --  4.9 4.4  --   CL 100  --  100 97*  --   CO2 29  --  28 27  --   GLUCOSE 75   < > 76 96 247*  BUN 20  --  21 23  --   CREATININE 0.97  --  1.10 1.23  --  CALCIUM 8.3*  --  8.0* 8.0*  --   GFRNONAA >60  --  >60 60*  --   GFRAA >60  --  >60 >60  --   ANIONGAP 6  --  8 9  --    < > = values in this interval not displayed.     Hematology Recent Labs  Lab 03/07/20 0458 03/08/20 0751 03/09/20 0255  WBC 6.8 7.5 7.0  RBC 4.12* 3.95* 4.03*  HGB 12.3* 11.7* 11.6*  HCT 39.1 37.4* 37.6*  MCV 94.9 94.7 93.3  MCH 29.9 29.6 28.8  MCHC 31.5 31.3 30.9  RDW 15.0 14.7 14.6  PLT 145* 144* 157    BNPNo results for input(s): BNP, PROBNP in the last 168 hours.   DDimer No results for input(s): DDIMER in the last 168 hours.   Radiology    DG Chest 2 View  Result Date: 03/09/2020 CLINICAL DATA:  Shortness of breath EXAM: CHEST - 2 VIEW COMPARISON:  03/07/2020 CT FINDINGS: Cardiac shadow is mildly enlarged. Bilateral pleural effusions and bibasilar atelectatic changes are noted stable from the prior exam. No new focal infiltrate is seen. No bony abnormality is noted. IMPRESSION: Bilateral effusions and adjacent atelectatic changes stable from the prior CT. Electronically Signed   By:  Inez Catalina M.D.   On: 03/09/2020 21:37   ECHO TEE  Result Date: 03/09/2020    TRANSESOPHOGEAL ECHO REPORT   Patient Name:   Jordan [name] Date of Exam: 03/09/2020 Medical Rec #:  [id number]         Height:       73.0 in Accession #:    [id number]        Weight:       187.7 lb Date of Birth:  [birth year]         BSA:          2.094 m Patient Age:    39 years          BP:           157/36 mmHg Patient Gender: M                 HR:           53 bpm. Exam Location:  Inpatient Procedure: Transesophageal Echo, Color Doppler, Cardiac Doppler and 3D Echo Indications:     Aortic valve disorder 424.1 / I35.9  History:         Patient has prior history of Echocardiogram examinations, most                  recent 03/04/2020. CHF, COPD and TIA; Risk Factors:Hypertension,                  Diabetes, Dyslipidemia, Current Smoker and Sleep Apnea. GERD.  Sonographer:     Vickie Epley RDCS Referring Phys:  [place] Diagnosing Phys: [name] MD PROCEDURE: The transesophogeal probe was passed without difficulty through the esophogus of the patient. Sedation performed by different physician. The patient was monitored while under deep sedation. Anesthestetic sedation was provided intravenously by Anesthesiology: 250.2mg  of Propofol. Patients was under conscious sedation during this procedure. Anesthetic administered: 2mcg of Fentanyl. The patient's vital signs; including heart rate, blood pressure, and oxygen saturation; remained stable throughout the procedure. The patient developed no complications during the procedure. IMPRESSIONS  1. Moderately calcified and thickened aortic valve with moderately restricted leaflet openings, however transaortic gradients in the severe range (mean gradient 43 mmHg) secondary  to high flow sec to severe aortic regurgitation.  2. Left ventricular ejection fraction, by estimation, is 60 to 65%. The left ventricle has normal function. The left ventricle has no regional wall motion  abnormalities.  3. Right ventricular systolic function is normal. The right ventricular size is normal.  4. Left atrial size was mildly dilated. No left atrial/left atrial appendage thrombus was detected.  5. The mitral valve is normal in structure. Trivial mitral valve regurgitation. No evidence of mitral stenosis.  6. The aortic valve is normal in structure. Aortic valve regurgitation is severe. Moderate to severe aortic valve stenosis. Aortic valve mean gradient measures 43.0 mmHg.  7. The inferior vena cava is normal in size with greater than 50% respiratory variability, suggesting right atrial pressure of 3 mmHg. Conclusion(s)/Recommendation(s): Normal biventricular function without evidence of hemodynamically significant valvular heart disease. FINDINGS  Left Ventricle: Left ventricular ejection fraction, by estimation, is 60 to 65%. The left ventricle has normal function. The left ventricle has no regional wall motion abnormalities. The left ventricular internal cavity size was normal in size. There is  no left ventricular hypertrophy. Right Ventricle: The right ventricular size is normal. No increase in right ventricular wall thickness. Right ventricular systolic function is normal. Left Atrium: Left atrial size was mildly dilated. No left atrial/left atrial appendage thrombus was detected. Right Atrium: Right atrial size was normal in size. Pericardium: There is no evidence of pericardial effusion. Mitral Valve: The mitral valve is normal in structure. Trivial mitral valve regurgitation. No evidence of mitral valve stenosis. Tricuspid Valve: The tricuspid valve is normal in structure. Tricuspid valve regurgitation is mild . No evidence of tricuspid stenosis. Aortic Valve: The aortic valve is normal in structure. Aortic valve regurgitation is severe. Aortic regurgitation PHT measures 424 msec. Moderate to severe aortic stenosis is present. Aortic valve mean gradient measures 43.0 mmHg. Aortic valve peak  gradient measures 73.8 mmHg. Pulmonic Valve: The pulmonic valve was normal in structure. Pulmonic valve regurgitation is not visualized. No evidence of pulmonic stenosis. Aorta: The aortic root is normal in size and structure. Venous: The inferior vena cava is normal in size with greater than 50% respiratory variability, suggesting right atrial pressure of 3 mmHg. IAS/Shunts: No atrial level shunt detected by color flow Doppler.  AORTIC VALVE AV Vmax:           429.50 cm/s AV Vmean:          301.500 cm/s AV VTI:            0.874 m AV Peak Grad:      73.8 mmHg AV Mean Grad:      43.0 mmHg LVOT Vmax:         88.90 cm/s LVOT Vmean:        59.250 cm/s LVOT VTI:          0.165 m LVOT/AV VTI ratio: 0.19 AI PHT:            424 msec  SHUNTS Systemic VTI: 0.16 m [name] MD Electronically signed by [name] MD Signature Date/Time: 03/09/2020/3:18:40 PM    Final (Updated)     Cardiac Studies    R/L heart cath 03/06/20 1. Nonobstructive coronary artery disease with mild nonobstructive plaquing in the RCA, wide patency of the left main, wide patency of the LAD, and mild to moderate mid circumflex stenosis 2. Moderate aortic stenosis with mean gradient 26 mmHg and calculated aortic valve area 1.6 cm. 3. Moderate pulmonary hypertension with mean PA pressure  34 mmHg, transpulmonary gradient 13 mmHg, PVR less than 2 Wood units  Echo 03/04/20 1. Left ventricular ejection fraction, by estimation, is 55 to 60%. The  left ventricle has normal function. The left ventricle has no regional  wall motion abnormalities. Left ventricular diastolic function could not  be evaluated.  2. Right ventricular systolic function is normal. The right ventricular  size is normal.  3. Left atrial size was moderately dilated.  4. Right atrial size was mildly dilated.  5. Large pleural effusion in the left lateral region.  6. The mitral valve is normal in structure. No evidence of mitral valve  regurgitation.   7. The aortic valve is grossly normal. There is moderate calcification of  the aortic valve. There is moderate thickening of the aortic valve. Aortic  valve regurgitation is mild. Moderate to severe aortic valve stenosis.   TEE 03/09/20 1. Moderately calcified and thickened aortic valve with moderately  restricted leaflet openings, however transaortic gradients in the severe  range (mean gradient 43 mmHg) secondary to high flow sec to severe aortic  regurgitation.  2. Left ventricular ejection fraction, by estimation, is 60 to 65%. The  left ventricle has normal function. The left ventricle has no regional  wall motion abnormalities.  3. Right ventricular systolic function is normal. The right ventricular  size is normal.  4. Left atrial size was mildly dilated. No left atrial/left atrial  appendage thrombus was detected.  5. The mitral valve is normal in structure. Trivial mitral valve  regurgitation. No evidence of mitral stenosis.  6. The aortic valve is normal in structure. Aortic valve regurgitation is  severe. Moderate to severe aortic valve stenosis. Aortic valve mean  gradient measures 43.0 mmHg.  7. The inferior vena cava is normal in size with greater than 50%  respiratory variability, suggesting right atrial pressure of 3 mmHg.   Conclusion(s)/Recommendation(s): Normal biventricular function without  evidence of hemodynamically significant valvular heart disease.   Patient Profile     69 y.o. male with pmh of COPD, DM2, HTN, obesity, OSA on CPAP who presented with acute CHF now undergoing TAVR work-up.  Assessment & Plan   Severe AS - Undergoing TAVR work-up per Dr. Burt Knack - CT chest/abd/pelvis showed severe calcification of aortic valve with severe AS - Cath and echo as above - TEE as above - Structural heart team following>>per Dr. Antionette Char note there is no further work-up during admission   Acute on chronic diastolic CHF - Echo with EF 55-60% and  large left pleural effusion - IV lasix held 03/05/20 for rising creatinine - Net -8.7L - Weight down 225lbs>>188lbs - creatinine improved and he was started on home lasix dose 40 mg daily. - creatinine1.1>1.23. AM labs pending - CXR showed stable bilateral pleural effusions. Patient off O2 in the room with sats at 95% so no plan for thoracentesis. Some new course breath sounds on exam.  - Still has some LLE edema on exam on lasix 40mg  daily. Will given one time dose of IV lasix 40 mg. MD to see  Bradycardia - Atenolol held - Heart rates 50-60s. Given brief SVT might need to restart low dose BB  HTN - BB and [place] held - PTA hydralazine>restarted - Pressures stable  HLD - atorvastatin   For questions or updates, please contact [place] Please consult [email or web address] for contact info under        Signed, Cadence Ninfa Meeker, PA-C  03/11/2020, 6:27 AM    History and all data above  reviewed.  Patient examined.  I agree with the findings as above. Breathing is better although he reports that he does drop his sats at night.  The patient exam reveals COR:RRR  ,  Lungs: Decreased breath sounds at the bases  ,  Abd: Positive bowel sounds, no rebound no guarding, Ext No   .  All available labs, radiology testing, previous records reviewed. Agree with documented assessment and plan.  From a cardiac standpoint I think that he is ready to go home.  He is having problems with hypoglycemia so I will defer plans for discharge to the primary team.  I think meds as on MAR will be indicated to include Lasix 40 mg daily.  I talked to him about PRN dosing.  I also spoke with nursing to have them check his O2 sats on RA with ambulation to see if he needs temporary PRN O2.  He has follow up in our clinic next week and follow up with surgery to discuss TAVR vs SAVR.    Jordan [name]  10:41 AM  03/11/2020

## 2020-03-11 NOTE — Progress Notes (Signed)
ReDS Clip Diuretic Study Pt study # [id number]  Your patient has been enrolled in the ReDS Clip Diuretic Study  Urine output 2.4 L out yesterday. Wt charted as stable from yesterday. Scr pending. Resumed lasix 40 mg PO daily on 9/20.   Changes to prescribed diuretics recommended:  Give lasix IV 40 mg x 1 today  Provider contacted: Tarri Glenn, PA Recommendation was accepted.  REDS Clip  READING= 40%  CHEST RULER = 33 Clip Station = D   Orthodema score = 0 Signs/Symptoms Score   Mild edema, no orthopnea 0 No congestion  Moderate edema, no orthopnea 1 Low-grade orthodema/congestion  Severe edema OR orthopnea 2   Moderate edema and orthopnea 3 High-grade orthodema/congestion  Severe edema AND orthopnea 4    Kerby Nora, PharmD, BCPS Phone [phone] 03/11/2020       8:25 AM  Please check [email or web address] for unit-specific pharmacist phone numbers

## 2020-03-11 NOTE — Progress Notes (Signed)
Physical Therapy Treatment Patient Details Name: Jordan [name] MRN: [id number] DOB: 15-Sep-1950 Today's Date: 03/11/2020    History of Present Illness Pt is a 69 y/o male admited secondary to increased weakness and SOB. Thought to be from acute CHF. TAVR workup pending. PMH includes COPD, DM, HTN, and tobacco use.    PT Comments    Pt agreeable to exercises due to fatigue; pt performed seated exercises with minimal rest needed between reps; pt fatigued more quickly with sit<>stand activity requesting to terminate following 3 reps; pt continues to demonstrate weakness and poor endurance; pt will benefit from continued skilled intervention to address deficits to maximize independence with functional mobility prior to discharge.     Follow Up Recommendations  Home health PT;Supervision - Intermittent     Equipment Recommendations  Other (comment) rollator?   Recommendations for Other Services       Precautions / Restrictions Precautions Precautions: Fall Precaution Comments: watch 02 Restrictions Weight Bearing Restrictions: No    Mobility  Bed Mobility                  Transfers Overall transfer level: Needs assistance Equipment used: Rolling walker (2 wheeled) Transfers: Sit to/from Stand   Stand pivot transfers: Supervision       General transfer comment: Supervision for safety, no physical assist needed; performed x 3 reps for LE strength; pt fatigued and requested to rest following 3 reps  Ambulation/Gait                 Stairs             Wheelchair Mobility    Modified Rankin (Stroke Patients Only)       Balance                                            Cognition Arousal/Alertness: Awake/alert Behavior During Therapy: WFL for tasks assessed/performed Overall Cognitive Status: Within Functional Limits for tasks assessed                                 General Comments: VSS; pt states MD  noticed "noise" in his lungs and will order chest x-ray      Exercises General Exercises - Lower Extremity Ankle Circles/Pumps: AROM;Both;20 reps;Seated Long Arc Quad: AROM;Both;20 reps;Seated Hip Flexion/Marching: AROM;Both;20 reps;Seated    General Comments        Pertinent Vitals/Pain Pain Assessment: No/denies pain    Home Living                      Prior Function            PT Goals (current goals can now be found in the care plan section) Acute Rehab PT Goals Patient Stated Goal: to go home PT Goal Formulation: With patient Time For Goal Achievement: 03/18/20 Potential to Achieve Goals: Good Progress towards PT goals: Progressing toward goals    Frequency    Min 3X/week      PT Plan Current plan remains appropriate    Co-evaluation              AM-PAC PT "6 Clicks" Mobility   Outcome Measure  Help needed turning from your back to your side while in a flat bed without using bedrails?: None   Help needed  moving to and from a bed to a chair (including a wheelchair)?: None Help needed standing up from a chair using your arms (e.g., wheelchair or bedside chair)?: A Little Help needed to walk in hospital room?: A Little Help needed climbing 3-5 steps with a railing? : A Lot 6 Click Score: 16    End of Session Equipment Utilized During Treatment: Gait belt Activity Tolerance: Patient tolerated treatment well Patient left: in chair;with call bell/phone within reach Nurse Communication: Mobility status PT Visit Diagnosis: Muscle weakness (generalized) (M62.81);Difficulty in walking, not elsewhere classified (R26.2)     Time: 5093-2671 PT Time Calculation (min) (ACUTE ONLY): 12 min  Charges:  $Therapeutic Exercise: 8-22 mins                     Lyanne Co, DPT Acute Rehabilitation Services [phone]   [place] 03/11/2020, 11:29 AM

## 2020-03-11 NOTE — Progress Notes (Signed)
Patient ID: Jordan [name], male   DOB: 1950/07/16, 69 y.o.   MRN: [id number]  PROGRESS NOTE    Jordan [name]  YTK:[id number] DOB: [birth year] DOA: 03/03/2020 PCP: [name], MD   Brief Narrative:  69 year old male with history of COPD, diabetes mellitus type 2, hypertension, obesity, OSA on CPAP presented with shortness of breath on exertion.  On presentation, chest x-ray showed bilateral edema, pleural effusion.  He was started on intravenous diuretics.  Cardiology was consulted.  He was found to have severe aortic stenosis on echocardiogram.  Assessment & Plan:   Severe aortic stenosis -Undergoing TAVR work-up as per Dr. Burt Knack -CT chest/abdomen/pelvis showed severe calcification of aortic valve with severe AS -Underwent echo/TEE/right and left heart cath which showed nonobstructive coronary artery disease -Outpatient follow-up with Dr. Burt Knack and cardiothoracic surgery/Dr. Cyndia Bent  Acute on chronic diastolic CHF -Echo showed EF of 55 to 60% with large left pleural effusion. -Chest x-ray on 03/09/2020 had shown bilateral pleural effusions.  Currently respiratory status is stable and there is no indication for thoracentesis -Continue diuresis as per cardiology team.  Currently on oral Lasix.  Continue hydralazine.  Strict input and output.  Daily weights.  Fluid restriction  Hypoglycemia -Questionable cause.  A1c 5.3.  Not on insulin.  No prior history of diabetes. -Currently on IV fluids with dextrose.  Blood sugars improving.  DC IV fluids. -Encourage oral intake.  C-peptide and insulin levels elevated.  Will need outpatient endocrinology evaluation  Bradycardia -Heart rate currently stable.  Atenolol on hold.  COPD -Currently stable.  Continue nebs.  Hypertension -Continue Lasix and hydralazine.  Beta-blocker on hold.  Sleep apnea  -Continue CPAP at bedtime  Depression -Continue venlafaxine  Generalized conditioning -We will need home health PT/OT   DVT  prophylaxis: Lovenox Code Status: Full Family Communication: None at bedside Disposition Plan: Status is: Inpatient  Remains inpatient appropriate because:Inpatient level of care appropriate due to severity of illness   Dispo: The patient is from: Home              Anticipated d/c is to: Home              Anticipated d/c date is: 1 day              Patient currently is not medically stable to d/c.   Consultants: Cardiology  Procedures:  Echo/TEE/cardiac catheterization  Antimicrobials:  None  Subjective: Patient seen and examined at bedside.  Denies worsening shortness of breath or chest pain.  Feels weak and tired and thinks that he might be ready to go home tomorrow.  Objective: Vitals:   03/10/20 2125 03/11/20 0440 03/11/20 0545 03/11/20 0730  BP: (!) 118/41 132/62  (!) 139/49  Pulse: 64 65    Resp: 20 20  16   Temp:  98.3 F (36.8 C)  98.9 F (37.2 C)  TempSrc:  Oral  Oral  SpO2: 97% 95%    Weight:   85.4 kg   Height:        Intake/Output Summary (Last 24 hours) at 03/11/2020 1304 Last data filed at 03/11/2020 1232 Gross per 24 hour  Intake 1089.01 ml  Output 3900 ml  Net -2810.99 ml   Filed Weights   03/09/20 1326 03/10/20 0507 03/11/20 0545  Weight: 85.1 kg 85.3 kg 85.4 kg    Examination:  General exam: Appears calm and comfortable.  Looks chronically ill. Respiratory system: Bilateral decreased breath sounds at bases with scattered crackles Cardiovascular system: S1 & S2  heard, Rate controlled Gastrointestinal system: Abdomen is nondistended, soft and nontender. Normal bowel sounds heard. Extremities: Bilateral lower extremities wrapped.  Trace lower extremity edema Central nervous system: Alert and oriented. No focal neurological deficits. Moving extremities Skin: No rashes, lesions or ulcers Psychiatry: Judgement and insight appear normal. Mood & affect appropriate.     Data Reviewed: I have personally reviewed following labs and imaging  studies  CBC: Recent Labs  Lab 03/05/20 0456 03/05/20 0456 03/06/20 0508 03/06/20 0508 03/06/20 1224 03/06/20 1225 03/07/20 0458 03/08/20 0751 03/09/20 0255  WBC 10.6*  --  7.4  --   --   --  6.8 7.5 7.0  HGB 12.0*   < > 12.1*   < > 11.6* 12.9* 12.3* 11.7* 11.6*  HCT 37.5*   < > 38.6*   < > 34.0* 38.0* 39.1 37.4* 37.6*  MCV 92.8  --  93.0  --   --   --  94.9 94.7 93.3  PLT 189  --  159  --   --   --  145* 144* 157   < > = values in this interval not displayed.   Basic Metabolic Panel: Recent Labs  Lab 03/05/20 0456 03/05/20 1225 03/06/20 0508 03/06/20 0508 03/06/20 1224 03/06/20 1225 03/07/20 0458 03/08/20 0751 03/09/20 0255 03/10/20 0540 03/10/20 1415  NA 137   < > 137  --    < > 141 139 135 136 133*  --   K 4.0   < > 4.5  --    < > 3.9 4.3 4.7 4.9 4.4  --   CL 97*   < > 96*  --   --   --  102 100 100 97*  --   CO2 32   < > 33*  --   --   --  28 29 28 27   --   GLUCOSE 90   < > 77   < >  --   --  83 75 76 96 247*  BUN 34*   < > 32*  --   --   --  26* 20 21 23   --   CREATININE 1.64*   < > 1.38*  --   --   --  0.97 0.97 1.10 1.23  --   CALCIUM 7.7*   < > 7.8*  --   --   --  8.2* 8.3* 8.0* 8.0*  --   MG 1.8  --   --   --   --   --   --   --   --   --   --    < > = values in this interval not displayed.   GFR: Estimated Creatinine Clearance: 64.1 mL/min (by C-G formula based on SCr of 1.23 mg/dL). Liver Function Tests: No results for input(s): AST, ALT, ALKPHOS, BILITOT, PROT, ALBUMIN in the last 168 hours. No results for input(s): LIPASE, AMYLASE in the last 168 hours. No results for input(s): AMMONIA in the last 168 hours. Coagulation Profile: No results for input(s): INR, PROTIME in the last 168 hours. Cardiac Enzymes: No results for input(s): CKTOTAL, CKMB, CKMBINDEX, TROPONINI in the last 168 hours. BNP (last 3 results) No results for input(s): PROBNP in the last 8760 hours. HbA1C: No results for input(s): HGBA1C in the last 72 hours. CBG: Recent Labs   Lab 03/11/20 0647 03/11/20 0713 03/11/20 0749 03/11/20 0952 03/11/20 1158  GLUCAP 74 42* 108* 91 94   Lipid Profile: No results for input(s): CHOL,  HDL, LDLCALC, TRIG, CHOLHDL, LDLDIRECT in the last 72 hours. Thyroid Function Tests: No results for input(s): TSH, T4TOTAL, FREET4, T3FREE, THYROIDAB in the last 72 hours. Anemia Panel: No results for input(s): VITAMINB12, FOLATE, FERRITIN, TIBC, IRON, RETICCTPCT in the last 72 hours. Sepsis Labs: No results for input(s): PROCALCITON, LATICACIDVEN in the last 168 hours.  Recent Results (from the past 240 hour(s))  SARS Coronavirus 2 by RT PCR (hospital order, performed in [facility] hospital lab) Nasopharyngeal Nasopharyngeal Swab     Status: None   Collection Time: 03/03/20  4:03 PM   Specimen: Nasopharyngeal Swab  Result Value Ref Range Status   SARS Coronavirus 2 NEGATIVE NEGATIVE Final    Comment: (NOTE) SARS-CoV-2 target nucleic acids are NOT DETECTED.  The SARS-CoV-2 RNA is generally detectable in upper and lower respiratory specimens during the acute phase of infection. The lowest concentration of SARS-CoV-2 viral copies this assay can detect is 250 copies / mL. A negative result does not preclude SARS-CoV-2 infection and should not be used as the sole basis for treatment or other patient management decisions.  A negative result may occur with improper specimen collection / handling, submission of specimen other than nasopharyngeal swab, presence of viral mutation(s) within the areas targeted by this assay, and inadequate number of viral copies (<250 copies / mL). A negative result must be combined with clinical observations, patient history, and epidemiological information.  Fact Sheet for Patients:   StrictlyIdeas.no  Fact Sheet for Healthcare Providers: BankingDealers.co.za  This test is not yet approved or  cleared by the [place] FDA and has been authorized for  detection and/or diagnosis of SARS-CoV-2 by FDA under an Emergency Use Authorization (EUA).  This EUA will remain in effect (meaning this test can be used) for the duration of the COVID-19 declaration under Section 564(b)(1) of the Act, 21 U.S.C. section 360bbb-3(b)(1), unless the authorization is terminated or revoked sooner.  Performed at [facility], [place] [address], [place], [place] [address]          Radiology Studies: DG Chest 2 View  Result Date: 03/09/2020 CLINICAL DATA:  Shortness of breath EXAM: CHEST - 2 VIEW COMPARISON:  03/07/2020 CT FINDINGS: Cardiac shadow is mildly enlarged. Bilateral pleural effusions and bibasilar atelectatic changes are noted stable from the prior exam. No new focal infiltrate is seen. No bony abnormality is noted. IMPRESSION: Bilateral effusions and adjacent atelectatic changes stable from the prior CT. Electronically Signed   By: Inez Catalina M.D.   On: 03/09/2020 21:37   ECHO TEE  Result Date: 03/09/2020    TRANSESOPHOGEAL ECHO REPORT   Patient Name:   Jordan [name] Date of Exam: 03/09/2020 Medical Rec #:  [id number]         Height:       73.0 in Accession #:    [id number]        Weight:       187.7 lb Date of Birth:  [birth year]         BSA:          2.094 m Patient Age:    42 years          BP:           157/36 mmHg Patient Gender: M                 HR:           53 bpm. Exam Location:  Inpatient Procedure: Transesophageal Echo, Color Doppler, Cardiac  Doppler and 3D Echo Indications:     Aortic valve disorder 424.1 / I35.9  History:         Patient has prior history of Echocardiogram examinations, most                  recent 03/04/2020. CHF, COPD and TIA; Risk Factors:Hypertension,                  Diabetes, Dyslipidemia, Current Smoker and Sleep Apnea. GERD.  Sonographer:     Vickie Epley RDCS Referring Phys:  [place] Diagnosing Phys: [name] MD PROCEDURE: The transesophogeal probe was passed without difficulty through the  esophogus of the patient. Sedation performed by different physician. The patient was monitored while under deep sedation. Anesthestetic sedation was provided intravenously by Anesthesiology: 250.2mg  of Propofol. Patients was under conscious sedation during this procedure. Anesthetic administered: 72mcg of Fentanyl. The patient's vital signs; including heart rate, blood pressure, and oxygen saturation; remained stable throughout the procedure. The patient developed no complications during the procedure. IMPRESSIONS  1. Moderately calcified and thickened aortic valve with moderately restricted leaflet openings, however transaortic gradients in the severe range (mean gradient 43 mmHg) secondary to high flow sec to severe aortic regurgitation.  2. Left ventricular ejection fraction, by estimation, is 60 to 65%. The left ventricle has normal function. The left ventricle has no regional wall motion abnormalities.  3. Right ventricular systolic function is normal. The right ventricular size is normal.  4. Left atrial size was mildly dilated. No left atrial/left atrial appendage thrombus was detected.  5. The mitral valve is normal in structure. Trivial mitral valve regurgitation. No evidence of mitral stenosis.  6. The aortic valve is normal in structure. Aortic valve regurgitation is severe. Moderate to severe aortic valve stenosis. Aortic valve mean gradient measures 43.0 mmHg.  7. The inferior vena cava is normal in size with greater than 50% respiratory variability, suggesting right atrial pressure of 3 mmHg. Conclusion(s)/Recommendation(s): Normal biventricular function without evidence of hemodynamically significant valvular heart disease. FINDINGS  Left Ventricle: Left ventricular ejection fraction, by estimation, is 60 to 65%. The left ventricle has normal function. The left ventricle has no regional wall motion abnormalities. The left ventricular internal cavity size was normal in size. There is  no left  ventricular hypertrophy. Right Ventricle: The right ventricular size is normal. No increase in right ventricular wall thickness. Right ventricular systolic function is normal. Left Atrium: Left atrial size was mildly dilated. No left atrial/left atrial appendage thrombus was detected. Right Atrium: Right atrial size was normal in size. Pericardium: There is no evidence of pericardial effusion. Mitral Valve: The mitral valve is normal in structure. Trivial mitral valve regurgitation. No evidence of mitral valve stenosis. Tricuspid Valve: The tricuspid valve is normal in structure. Tricuspid valve regurgitation is mild . No evidence of tricuspid stenosis. Aortic Valve: The aortic valve is normal in structure. Aortic valve regurgitation is severe. Aortic regurgitation PHT measures 424 msec. Moderate to severe aortic stenosis is present. Aortic valve mean gradient measures 43.0 mmHg. Aortic valve peak gradient measures 73.8 mmHg. Pulmonic Valve: The pulmonic valve was normal in structure. Pulmonic valve regurgitation is not visualized. No evidence of pulmonic stenosis. Aorta: The aortic root is normal in size and structure. Venous: The inferior vena cava is normal in size with greater than 50% respiratory variability, suggesting right atrial pressure of 3 mmHg. IAS/Shunts: No atrial level shunt detected by color flow Doppler.  AORTIC VALVE AV Vmax:  429.50 cm/s AV Vmean:          301.500 cm/s AV VTI:            0.874 m AV Peak Grad:      73.8 mmHg AV Mean Grad:      43.0 mmHg LVOT Vmax:         88.90 cm/s LVOT Vmean:        59.250 cm/s LVOT VTI:          0.165 m LVOT/AV VTI ratio: 0.19 AI PHT:            424 msec  SHUNTS Systemic VTI: 0.16 m [name] MD Electronically signed by [name] MD Signature Date/Time: 03/09/2020/3:18:40 PM    Final (Updated)         Scheduled Meds: . aspirin EC  81 mg Oral Daily  . atorvastatin  80 mg Oral QPC supper  . budesonide (PULMICORT) nebulizer solution   0.25 mg Nebulization BID  . dextrose  25 g Intravenous STAT  . enoxaparin (LOVENOX) injection  40 mg Subcutaneous Q24H  . feeding supplement (ENSURE ENLIVE)  237 mL Oral BID BM  . folic acid  1 mg Oral Daily  . furosemide  40 mg Oral Daily  . hydrALAZINE  25 mg Oral Q8H  . hydrocerin   Topical TID  . multivitamin with minerals  1 tablet Oral Daily  . pantoprazole  40 mg Oral Daily  . sodium chloride flush  3 mL Intravenous Q12H  . sodium chloride flush  3 mL Intravenous Q12H  . tamsulosin  0.4 mg Oral Daily  . venlafaxine XR  150 mg Oral QPC supper   Continuous Infusions: . sodium chloride    . dextrose 5 % and 0.45% NaCl 50 mL/hr at 03/10/20 1818  . lactated ringers 10 mL/hr at 03/09/20 1353          Eyad Rochford Starla Link, MD [facility] 03/11/2020, 1:04 PM

## 2020-03-11 NOTE — Progress Notes (Signed)
Initial Nutrition Assessment  DOCUMENTATION CODES:   Severe malnutrition in context of chronic illness  INTERVENTION:    Continue Ensure Enlive po BID, each supplement provides 350 kcal and 20 grams of protein  Continue MVI with minerals daily.  NUTRITION DIAGNOSIS:   Severe Malnutrition related to chronic illness (COPD, CHF) as evidenced by severe muscle depletion, severe fat depletion.  Ongoing  GOAL:   Patient will meet greater than or equal to 90% of their needs   Met with intake of meals and supplements  MONITOR:   PO intake, Supplement acceptance, Labs  REASON FOR ASSESSMENT:   Consult Assessment of nutrition requirement/status  ASSESSMENT:   69 yo male admitted with acute on chronic CHF in the setting of aortic stenosis. PMH includes COPD, DM-2, GERD, HLD, HTN, chronic venous insufficiency.   S/P R/L heart cath 9/17: nonobstructive CAD, moderate aortic stenosis, moderate pulmonary HTN.  S/P TEE 9/20: severe aortic valve regurgitation, mild mitral valve regurgitation, moderately calcified and thickened aortic valve with moderately restricted leaflet openings.  TAVR work-up is ongoing. Patient has been having recurrent episodes of hypoglycemia with unclear etiology. CBG's have been better today. Patient continues to eat well. He eats 100% of all meals. He has been drinking Ensure supplements 2 times per day as well. Intake is meeting nutrition needs.  Labs reviewed.  CBG: [phone]  Medications reviewed and include folic acid, lasix, MVI with minerals, protonix, flomax.  Weight down to 85.4 kg from 102.1 kg on admission 9/14. Weight loss related to diuresis.   Diet Order:   Diet Order            Diet Heart Room service appropriate? Yes; Fluid consistency: Thin; Fluid restriction: 1500 mL Fluid  Diet effective now                 EDUCATION NEEDS:   Education needs have been addressed  Skin:  Skin Assessment: Reviewed RN Assessment  Last  BM:  9/19 type 4  Height:   Ht Readings from Last 1 Encounters:  03/09/20 6' 1"  (1.854 m)    Weight:   Wt Readings from Last 1 Encounters:  03/11/20 85.4 kg    Ideal Body Weight:  83.6 kg  BMI:  Body mass index is 24.83 kg/m.  Estimated Nutritional Needs:   Kcal:  2200-2400  Protein:  120-135 gm  Fluid:  2-2.2 L    Lucas Mallow, RD, LDN, CNSC Please refer to Amion for contact information.

## 2020-03-11 NOTE — Progress Notes (Signed)
Patient had 7 beats SVT at 1718.

## 2020-03-12 DIAGNOSIS — E162 Hypoglycemia, unspecified: Secondary | ICD-10-CM

## 2020-03-12 LAB — BASIC METABOLIC PANEL
Anion gap: 9 (ref 5–15)
BUN: 20 mg/dL (ref 8–23)
CO2: 28 mmol/L (ref 22–32)
Calcium: 8.4 mg/dL — ABNORMAL LOW (ref 8.9–10.3)
Chloride: 95 mmol/L — ABNORMAL LOW (ref 98–111)
Creatinine, Ser: 1.18 mg/dL (ref 0.61–1.24)
GFR calc Af Amer: >60 mL/min (ref >60)
GFR calc non Af Amer: >60 mL/min (ref >60)
Glucose, Bld: 96 mg/dL (ref 70–99)
Potassium: 3.9 mmol/L (ref 3.5–5.1)
Sodium: 132 mmol/L — ABNORMAL LOW (ref 135–145)

## 2020-03-12 LAB — CBC
HCT: 37 % — ABNORMAL LOW (ref 39.0–52.0)
Hemoglobin: 11.8 g/dL — ABNORMAL LOW (ref 13.0–17.0)
MCH: 29.4 pg (ref 26.0–34.0)
MCHC: 31.9 g/dL (ref 30.0–36.0)
MCV: 92 fL (ref 80.0–100.0)
Platelets: 195 10*3/uL (ref 150–400)
RBC: 4.02 MIL/uL — ABNORMAL LOW (ref 4.22–5.81)
RDW: 14.8 % (ref 11.5–15.5)
WBC: 8.6 10*3/uL (ref 4.0–10.5)
nRBC: 0 % (ref 0.0–0.2)

## 2020-03-12 LAB — GLUCOSE, CAPILLARY
Glucose-Capillary: 111 mg/dL — ABNORMAL HIGH (ref 70–99)
Glucose-Capillary: 85 mg/dL (ref 70–99)
Glucose-Capillary: 94 mg/dL (ref 70–99)

## 2020-03-12 LAB — MAGNESIUM: Magnesium: 1.8 mg/dL (ref 1.7–2.4)

## 2020-03-12 MED ORDER — LORAZEPAM 1 MG PO TABS: 0.5000 mg | ORAL_TABLET | Freq: Two times a day (BID) | ORAL | Status: DC | PRN

## 2020-03-12 MED ORDER — IPRATROPIUM-ALBUTEROL 0.5-2.5 (3) MG/3ML IN SOLN: 3.0000 mL | Freq: Four times a day (QID) | RESPIRATORY_TRACT | 0 refills | Status: DC | PRN

## 2020-03-12 MED ORDER — BUDESONIDE-FORMOTEROL FUMARATE 160-4.5 MCG/ACT IN AERO: 2.0000 | INHALATION_SPRAY | Freq: Two times a day (BID) | RESPIRATORY_TRACT | 0 refills | Status: DC

## 2020-03-12 MED ORDER — DOXYCYCLINE MONOHYDRATE 100 MG PO TABS: 100.0000 mg | ORAL_TABLET | Freq: Two times a day (BID) | ORAL | 0 refills | Status: AC

## 2020-03-12 MED ORDER — FUROSEMIDE 40 MG PO TABS
40.0000 mg | ORAL_TABLET | Freq: Every day | ORAL | 0 refills | Status: DC
Start: 2020-03-12 — End: 2021-03-16

## 2020-03-12 NOTE — Discharge Instructions (Signed)
Heart Failure, Diagnosis  Heart failure means that your heart is not able to pump blood in the right way. This makes it hard for your body to work well. Heart failure is usually a long-term (chronic) condition. You must take good care of yourself and follow your treatment plan from your doctor. What are the causes? This condition may be caused by:  High blood pressure.  Build up of cholesterol and fat in the arteries.  Heart attack. This injures the heart muscle.  Heart valves that do not open and close properly.  Damage of the heart muscle. This is also called cardiomyopathy.  Lung disease.  Abnormal heart rhythms. What increases the risk? The risk of heart failure goes up as a person ages. This condition is also more likely to develop in people who:  Are overweight.  Are male.  Smoke or chew tobacco.  Abuse alcohol or illegal drugs.  Have taken medicines that can damage the heart.  Have diabetes.  Have abnormal heart rhythms.  Have thyroid problems.  Have low blood counts (anemia). What are the signs or symptoms? Symptoms of this condition include:  Shortness of breath.  Coughing.  Swelling of the feet, ankles, legs, or belly.  Losing weight for no reason.  Trouble breathing.  Waking from sleep because of the need to sit up and get more air.  Rapid heartbeat.  Being very tired.  Feeling dizzy, or feeling like you may pass out (faint).  Having no desire to eat.  Feeling like you may vomit (nauseous).  Peeing (urinating) more at night.  Feeling confused. How is this treated?     This condition may be treated with:  Medicines. These can be given to treat blood pressure and to make the heart muscles stronger.  Changes in your daily life. These may include eating a healthy diet, staying at a healthy body weight, quitting tobacco and illegal drug use, or doing exercises.  Surgery. Surgery can be done to open blocked valves, or to put devices in  the heart, such as pacemakers.  A donor heart (heart transplant). You will receive a healthy heart from a donor. Follow these instructions at home:  Treat other conditions as told by your doctor. These may include high blood pressure, diabetes, thyroid disease, or abnormal heart rhythms.  Learn as much as you can about heart failure.  Get support as you need it.  Keep all follow-up visits as told by your doctor. This is important. Summary  Heart failure means that your heart is not able to pump blood in the right way.  This condition is caused by high blood pressure, heart attack, or damage of the heart muscle.  Symptoms of this condition include shortness of breath and swelling of the feet, ankles, legs, or belly. You may also feel very tired or feel like you may vomit.  You may be treated with medicines, surgery, or changes in your daily life.  Treat other health conditions as told by your doctor. This information is not intended to replace advice given to you by your health care provider. Make sure you discuss any questions you have with your health care provider. Document Revised: 08/24/2018 Document Reviewed: 08/24/2018 Elsevier Patient Education  [place].   Heart Failure Eating Plan Heart failure, also called congestive heart failure, occurs when your heart does not pump blood well enough to meet your body's needs for oxygen-rich blood. Heart failure is a long-term (chronic) condition. Living with heart failure can be  challenging. However, following your health care provider's instructions about a healthy lifestyle and working with a diet and nutrition specialist (dietitian) to choose the right foods may help to improve your symptoms. What are tips for following this plan? Reading food labels  Check food labels for the amount of sodium per serving. Choose foods that have less than 140 mg (milligrams) of sodium in each serving.  Check food labels for the number of  calories per serving. This is important if you need to limit your daily calorie intake to lose weight.  Check food labels for the serving size. If you eat more than one serving, you will be eating more sodium and calories than what is listed on the label.  Look for foods that are labeled as "sodium-free," "very low sodium," or "low sodium." ? Foods labeled as "reduced sodium" or "lightly salted" may still have more sodium than what is recommended for you. Cooking  Avoid adding salt when cooking. Ask your health care provider or dietitian before using salt substitutes.  Season food with salt-free seasonings, spices, or herbs. Check the label of seasoning mixes to make sure they do not contain salt.  Cook with heart-healthy oils, such as olive, canola, soybean, or sunflower oil.  Do not fry foods. Cook foods using low-fat methods, such as baking, boiling, grilling, and broiling.  Limit unhealthy fats when cooking by: ? Removing the skin from poultry, such as chicken. ? Removing all visible fats from meats. ? Skimming the fat off from stews, soups, and gravies before serving them. Meal planning   Limit your intake of: ? Processed, canned, or pre-packaged foods. ? Foods that are high in trans fat, such as fried foods. ? Sweets, desserts, sugary drinks, and other foods with added sugar. ? Full-fat dairy products, such as whole milk.  Eat a balanced diet that includes: ? 4-5 servings of fruit each day and 4-5 servings of vegetables each day. At each meal, try to fill half of your plate with fruits and vegetables. ? Up to 6-8 servings of whole grains each day. ? Up to 2 servings of lean meat, poultry, or fish each day. One serving of meat is equal to 3 oz. This is about the same size as a deck of cards. ? 2 servings of low-fat dairy each day. ? Heart-healthy fats. Healthy fats called omega-3 fatty acids are found in foods such as flaxseed and cold-water fish like sardines, salmon, and  mackerel.  Aim to eat 25-35 g (grams) of fiber a day. Foods that are high in fiber include apples, broccoli, carrots, beans, peas, and whole grains.  Do not add salt or condiments that contain salt (such as soy sauce) to foods before eating.  When eating at a restaurant, ask that your food be prepared with less salt or no salt, if possible.  Try to eat 2 or more vegetarian meals each week.  Eat more home-cooked food and eat less restaurant, buffet, and fast food. General information  Do not eat more than 2,300 mg of salt (sodium) a day. The amount of sodium that is recommended for you may be lower, depending on your condition.  Maintain a healthy body weight as directed. Ask your health care provider what a healthy weight is for you. ? Check your weight every day. ? Work with your health care provider and dietitian to make a plan that is right for you to lose weight or maintain your current weight.  Limit how much fluid you drink.  Ask your health care provider or dietitian how much fluid you can have each day.  Limit or avoid alcohol as told by your health care provider or dietitian. Recommended foods The items listed may not be a complete list. Talk with your dietitian about what dietary choices are best for you. Fruits All fresh, frozen, and canned fruits. Dried fruits, such as raisins, prunes, and cranberries. Vegetables All fresh vegetables. Vegetables that are frozen without sauce or added salt. Low-sodium or sodium-free canned vegetables. Grains Bread with less than 80 mg of sodium per slice. Whole-wheat pasta, quinoa, and brown rice. Oats and oatmeal. Barley. Millet. Grits and cream of wheat. Whole-grain and whole-wheat cold cereal. Meats and other protein foods Lean cuts of meat. Skinless chicken and turkey. Fish with high omega-3 fatty acids, such as salmon, sardines, and other cold-water fishes. Eggs. Dried beans, peas, and edamame. Unsalted nuts and nut  butters. Dairy Low-fat or nonfat (skim) milk and dried milk. Rice milk, soy milk, and almond milk. Low-fat or nonfat yogurt. Small amounts of reduced-sodium block cheese. Low-sodium cottage cheese. Fats and oils Olive, canola, soybean, flaxseed, or sunflower oil. Avocado. Sweets and desserts Apple sauce. Granola bars. Sugar-free pudding and gelatin. Frozen fruit bars. Seasoning and other foods Fresh and dried herbs. Lemon or lime juice. Vinegar. Low-sodium ketchup. Salt-free marinades, salad dressings, sauces, and seasonings. The items listed above may not be a complete list of foods and beverages you can eat. Contact a dietitian for more information. Foods to avoid The items listed may not be a complete list. Talk with your dietitian about what dietary choices are best for you. Fruits Fruits that are dried with sodium-containing preservatives. Vegetables Canned vegetables. Frozen vegetables with sauce or seasonings. Creamed vegetables. French fries. Onion rings. Pickled vegetables and sauerkraut. Grains Bread with more than 80 mg of sodium per slice. Hot or cold cereal with more than 140 mg sodium per serving. Salted pretzels and crackers. Pre-packaged breadcrumbs. Bagels, croissants, and biscuits. Meats and other protein foods Ribs and chicken wings. Bacon, ham, pepperoni, bologna, salami, and packaged luncheon meats. Hot dogs, bratwurst, and sausage. Canned meat. Smoked meat and fish. Salted nuts and seeds. Dairy Whole milk, half-and-half, and cream. Buttermilk. Processed cheese, cheese spreads, and cheese curds. Regular cottage cheese. Feta cheese. Shredded cheese. String cheese. Fats and oils Butter, lard, shortening, ghee, and bacon fat. Canned and packaged gravies. Seasoning and other foods Onion salt, garlic salt, table salt, and sea salt. Marinades. Regular salad dressings. Relishes, pickles, and olives. Meat flavorings and tenderizers, and bouillon cubes. Horseradish, ketchup, and  mustard. Worcestershire sauce. Teriyaki sauce, soy sauce (including reduced sodium). Hot sauce and Tabasco sauce. Steak sauce, fish sauce, oyster sauce, and cocktail sauce. Taco seasonings. Barbecue sauce. Tartar sauce. The items listed above may not be a complete list of foods and beverages you should avoid. Contact a dietitian for more information. Summary  A heart failure eating plan includes changes that limit your intake of sodium and unhealthy fat, and it may help you lose weight or maintain a healthy weight. Your health care provider may also recommend limiting how much fluid you drink.  Most people with heart failure should eat no more than 2,300 mg of salt (sodium) a day. The amount of sodium that is recommended for you may be lower, depending on your condition.  Contact your health care provider or dietitian before making any major changes to your diet. This information is not intended to replace advice given to you by your   health care provider. Make sure you discuss any questions you have with your health care provider. Document Revised: 08/02/2018 Document Reviewed: 10/21/2016 Elsevier Patient Education  [place]  This sheet gives you information about how to care for yourself after your procedure. Your health care provider may also give you more specific instructions. If you have problems or questions, contact your health care provider. What can I expect after the procedure? After the procedure, it is common to have:  Bruising and tenderness at the catheter insertion area. Follow these instructions at home: Medicines  Take over-the-counter and prescription medicines only as told by your health care provider. Insertion site care  Follow instructions from your health care provider about how to take care of your insertion site. Make sure you: ? Wash your hands with soap and water before you change your bandage (dressing). If soap and water are not  available, use hand sanitizer. ? Change your dressing as told by your health care provider. ? Leave stitches (sutures), skin glue, or adhesive strips in place. These skin closures may need to stay in place for 2 weeks or longer. If adhesive strip edges start to loosen and curl up, you may trim the loose edges. Do not remove adhesive strips completely unless your health care provider tells you to do that.  Check your insertion site every day for signs of infection. Check for: ? Redness, swelling, or pain. ? Fluid or blood. ? Pus or a bad smell. ? Warmth.  Do not take baths, swim, or use a hot tub until your health care provider approves.  You may shower 24-48 hours after the procedure, or as directed by your health care provider. ? Remove the dressing and gently wash the site with plain soap and water. ? Pat the area dry with a clean towel. ? Do not rub the site. That could cause bleeding.  Do not apply powder or lotion to the site. Activity   For 24 hours after the procedure, or as directed by your health care provider: ? Do not flex or bend the affected arm. ? Do not push or pull heavy objects with the affected arm. ? Do not drive yourself home from the hospital or clinic. You may drive 24 hours after the procedure unless your health care provider tells you not to. ? Do not operate machinery or power tools.  Do not lift anything that is heavier than 10 lb (4.5 kg), or the limit that you are told, until your health care provider says that it is safe.  Ask your health care provider when it is okay to: ? Return to work or school. ? Resume usual physical activities or sports. ? Resume sexual activity. General instructions  If the catheter site starts to bleed, raise your arm and put firm pressure on the site. If the bleeding does not stop, get help right away. This is a medical emergency.  If you went home on the same day as your procedure, a responsible adult should be with you for  the first 24 hours after you arrive home.  Keep all follow-up visits as told by your health care provider. This is important. Contact a health care provider if:  You have a fever.  You have redness, swelling, or yellow drainage around your insertion site. Get help right away if:  You have unusual pain at the radial site.  The catheter insertion area swells very fast.  The insertion area is bleeding,  and the bleeding does not stop when you hold steady pressure on the area.  Your arm or hand becomes pale, cool, tingly, or numb. These symptoms may represent a serious problem that is an emergency. Do not wait to see if the symptoms will go away. Get medical help right away. Call your local emergency services (911 in the [place] Do not drive yourself to the hospital. Summary  After the procedure, it is common to have bruising and tenderness at the site.  Follow instructions from your health care provider about how to take care of your radial site wound. Check the wound every day for signs of infection.  Do not lift anything that is heavier than 10 lb (4.5 kg), or the limit that you are told, until your health care provider says that it is safe. This information is not intended to replace advice given to you by your health care provider. Make sure you discuss any questions you have with your health care provider. Document Revised: 07/12/2017 Document Reviewed: 07/12/2017 Elsevier Patient Education  2020 [employer].

## 2020-03-12 NOTE — Progress Notes (Signed)
ReDS Clip Diuretic Study Pt study # [id number]  Your patient has been enrolled in the ReDS Clip Diuretic Study  Urine output 4.9 L out yesterday. Wt charted as stable from yesterday. Scr stable at 1.18. Resumed lasix 40 mg PO daily on 9/20- received 1 dose of lasix IV 40 mg on 9/22.   Changes to prescribed diuretics recommended:  Can resume lasix PO.   Provider contacted: Sande Rives, PA Recommendation was accepted.  REDS Clip  READING= 23%  CHEST RULER = 33 Clip Station = D   Orthodema score = 0 Signs/Symptoms Score   Mild edema, no orthopnea 0 No congestion  Moderate edema, no orthopnea 1 Low-grade orthodema/congestion  Severe edema OR orthopnea 2   Moderate edema and orthopnea 3 High-grade orthodema/congestion  Severe edema AND orthopnea 4    Antonietta Jewel, PharmD, BCCCP Clinical Pharmacist  Phone: [phone] 03/12/2020 7:33 AM  Please check AMION for all [place] phone numbers After 10:00 PM, call [place] [phone]

## 2020-03-12 NOTE — Discharge Summary (Addendum)
Physician Discharge Summary  Jordan [name] IZT:[id number] DOB: [birth year] DOA: 03/03/2020  PCP: [name], MD  Admit date: 03/03/2020 Discharge date: 03/12/2020  Admitted From: Home Disposition: Home  Recommendations for Outpatient Follow-up:  1. Follow up with PCP in 1 week with repeat CBC/BMP 2. Recommend outpatient evaluation and follow-up by endocrinology for evaluation of hypoglycemia 3. Outpatient follow-up with cardiology and cardiothoracic surgery 4. Follow up in ED if symptoms worsen or new appear   Home Health: Home with PT/OT Equipment/Devices: None  Discharge Condition: Stable CODE STATUS: Full Diet recommendation: Heart healthy/fluid restriction of up to 1500 cc a day  Brief/Interim Summary:  69 year old male with history of COPD, diabetes mellitus type 2, hypertension, obesity, OSA on CPAP presented with shortness of breath on exertion.  On presentation, chest x-ray showed bilateral edema, pleural effusion.  He was started on intravenous diuretics.  Cardiology was consulted.  He was found to have severe aortic stenosis on echocardiogram.  During the hospitalization, he was initially treated with IV Lasix which was subsequently switched to oral Lasix.  He diuresed well.  Cardiology has recommended outpatient follow-up with cardiology and he will need a close outpatient evaluation and follow-up by cardiothoracic surgery/Dr. Cyndia Bent.  Cardiology has cleared the patient for discharge.  He will be discharged home today.  Discharge Diagnoses:   Severe aortic stenosis -Undergoing TAVR work-up as per Dr. Burt Knack -CT chest/abdomen/pelvis showed severe calcification of aortic valve with severe AS -Underwent echo/TEE/right and left heart cath which showed nonobstructive coronary artery disease -Outpatient follow-up with Dr. Burt Knack and cardiothoracic surgery/Dr. Cyndia Bent  Acute on chronic diastolic CHF -Echo showed EF of 55 to 60% with large left pleural effusion. -Chest  x-ray on 03/09/2020 had shown bilateral pleural effusions.  Currently respiratory status is stable and there is no indication for thoracentesis -Continue diuresis as per cardiology team.  Currently on oral Lasix.  Continue hydralazine.    Continue low-salt diet and Fluid restriction -Outpatient follow-up with cardiology.  Hypoglycemia -Questionable cause.  A1c 5.3.  Not on insulin.  No prior history of diabetes. -Treated IV fluids with dextrose.  Blood sugars improving.    Of IV fluids. -Encourage oral intake.  C-peptide and insulin levels elevated.  Will need outpatient endocrinology evaluation  Bradycardia -Heart rate currently stable.  Atenolol on hold.  COPD -Currently stable.  Outpatient follow-up.  Continue Advair on discharge.  Use as needed nebs  Hypertension -Continue Lasix and hydralazine.  Beta-blocker on hold.  Sleep apnea  -Continue CPAP at bedtime  Depression -Continue venlafaxine  Generalized conditioning -will need home health PT/OT   Discharge Instructions  Discharge Instructions    Amb Referral to HF Clinic   Complete by: As directed    Diet - low sodium heart healthy   Complete by: As directed    For home use only DME Nebulizer machine   Complete by: As directed    Patient needs a nebulizer to treat with the following condition: COPD (chronic obstructive pulmonary disease) ([place])   Length of Need: Lifetime   Increase activity slowly   Complete by: As directed      Allergies as of 03/12/2020      Reactions   Altace [ramipril]    Dizziness, migraine, weakness   Codeine Itching   Naproxen Itching      Medication List    STOP taking these medications   aliskiren 300 MG tablet Commonly known as: TEKTURNA   atenolol 25 MG tablet Commonly known as: TENORMIN  TAKE these medications   aspirin 81 MG tablet Take 81 mg by mouth daily.   atorvastatin 80 MG tablet Commonly known as: LIPITOR Take 80 mg by mouth daily after supper.    budesonide-formoterol 160-4.5 MCG/ACT inhaler Commonly known as: Symbicort Inhale 2 puffs into the lungs in the morning and at bedtime.   butalbital-acetaminophen-caffeine 50-325-40 MG tablet Commonly known as: FIORICET Take 1 tablet by mouth 2 (two) times daily as needed for pain or headache.   doxepin 50 MG capsule Commonly known as: SINEQUAN Take 50 mg by mouth in the morning and at bedtime.   doxycycline 100 MG tablet Commonly known as: ADOXA Take 1 tablet (100 mg total) by mouth 2 (two) times daily for 5 days.   folic acid 1 MG tablet Commonly known as: FOLVITE Take 1 tablet (1 mg total) by mouth daily.   furosemide 40 MG tablet Commonly known as: LASIX Take 1 tablet (40 mg total) by mouth daily. What changed:   when to take this  reasons to take this   hydrALAZINE 25 MG tablet Commonly known as: APRESOLINE Take 1 tablet (25 mg total) by mouth every 8 (eight) hours.   ipratropium-albuterol 0.5-2.5 (3) MG/3ML Soln Commonly known as: DUONEB Take 3 mLs by nebulization every 6 (six) hours as needed (shortness of breath/wheezing).   LORazepam 1 MG tablet Commonly known as: ATIVAN Take 0.5 tablets (0.5 mg total) by mouth 2 (two) times daily as needed for anxiety. What changed:   how much to take  when to take this  reasons to take this   montelukast 10 MG tablet Commonly known as: SINGULAIR Take 10 mg by mouth daily.   multivitamin with minerals Tabs tablet Take 1 tablet by mouth daily.   omeprazole 20 MG capsule Commonly known as: PRILOSEC Take 20 mg by mouth daily.   tamsulosin 0.4 MG Caps capsule Commonly known as: FLOMAX Take 0.4 mg by mouth daily.   venlafaxine XR 75 MG 24 hr capsule Commonly known as: EFFEXOR-XR Take 150 mg by mouth daily after supper. BRAND NAME ONLY            Durable Medical Equipment  (From admission, onward)         Start     Ordered   03/12/20 0000  For home use only DME Nebulizer machine       Question  Answer Comment  Patient needs a nebulizer to treat with the following condition COPD (chronic obstructive pulmonary disease) ([place])   Length of Need Lifetime      03/12/20 0947   03/09/20 1303  For home use only DME Walker rolling  Once       Comments: Needs Rollator. 6'1"  187 lbs.  Question Answer Comment  Walker: With [place]   Patient needs a walker to treat with the following condition Generalized weakness      03/09/20 1304           Follow-up Information    [name] T, PA-C. Go on 03/18/2020.   Specialties: Cardiology, Physician Assistant Why: @2 :45pm for hospital follow up with Dr. Marlou Porch' aPA Contact information: [address] [address] [phone]        [facility], [facility] Follow up.   Specialty: [place] Why: A representative from [facility] will contact you to arrange start date and time for your therapy. Contact information: [place] [address] [phone]  [facility] Follow up.   Why: Rolling walker delivered to room       [name], MD. Schedule an appointment as soon as possible for a visit in 1 week(s).   Specialty: Endocrinology Why: With repeat CBC/BMP Contact information: [place] [address] [phone]        Jerline Pain, MD .   Specialty: Cardiology Contact information: [phone] [place] [address] [phone]              Allergies  Allergen Reactions  . Altace [Ramipril]     Dizziness, migraine, weakness  . Codeine Itching  . Naproxen Itching    Consultations:  Cardiology   Procedures/Studies: DG Orthopantogram  Result Date: 03/06/2020 CLINICAL DATA:  Poor dentition EXAM: ORTHOPANTOGRAM/PANORAMIC COMPARISON:  None. FINDINGS: Two Panorex views of the mandible are obtained. There is excessive patient motion on the study which severely limits evaluation.  Only the lower central incisors, rib lower right lateral incisor, and lower right canine are well visualized and grossly intact. Erosive changes are seen within the bilateral upper premolars and right lower premolar. Other teeth are absent. No acute bony abnormalities. IMPRESSION: 1. Limited study as above, with erosive changes of the bilateral upper and right lower premolars. No bony abnormalities within the mandible and visualized facial bones. Electronically Signed   By: Randa Ngo M.D.   On: 03/06/2020 19:37   DG Chest 2 View  Result Date: 03/09/2020 CLINICAL DATA:  Shortness of breath EXAM: CHEST - 2 VIEW COMPARISON:  03/07/2020 CT FINDINGS: Cardiac shadow is mildly enlarged. Bilateral pleural effusions and bibasilar atelectatic changes are noted stable from the prior exam. No new focal infiltrate is seen. No bony abnormality is noted. IMPRESSION: Bilateral effusions and adjacent atelectatic changes stable from the prior CT. Electronically Signed   By: Inez Catalina M.D.   On: 03/09/2020 21:37   DG Chest 2 View  Result Date: 03/05/2020 CLINICAL DATA:  Recent shortness of breath EXAM: CHEST - 2 VIEW COMPARISON:  March 03, 2020. FINDINGS: There is cardiomegaly with a degree of pulmonary venous hypertension. There are pleural effusions bilaterally. There is slight interstitial edema. There is atelectatic change in the lung bases. No adenopathy. There is aortic atherosclerosis. Degenerative change noted in each shoulder. IMPRESSION: Cardiomegaly with pulmonary vascular congestion. Pleural effusions and mild edema. Appearance is felt to be indicative of a degree of congestive heart failure. There is bibasilar atelectasis. No frank consolidation. Aortic Atherosclerosis (ICD10-I70.0). Electronically Signed   By: Lowella Grip III M.D.   On: 03/05/2020 15:17   CARDIAC CATHETERIZATION  Result Date: 03/06/2020 1.  Nonobstructive coronary artery disease with mild nonobstructive plaquing in the RCA,  wide patency of the left main, wide patency of the LAD, and mild to moderate mid circumflex stenosis 2.  Moderate aortic stenosis with mean gradient 26 mmHg and calculated aortic valve area 1.6 cm. 3.  Moderate pulmonary hypertension with mean PA pressure 34 mmHg, transpulmonary gradient 13 mmHg, PVR less than 2 Wood units  CT CORONARY MORPH W/CTA COR W/SCORE W/CA W/CM &/OR WO/CM  Addendum Date: 03/09/2020   ADDENDUM REPORT: 03/09/2020 16:59 CLINICAL DATA:  69 year old male with severe aortic stenosis being evaluated for a TAVR procedure. EXAM: Cardiac TAVR CT TECHNIQUE: The patient was scanned on a [employer]. A 120 kV retrospective scan was triggered in the descending thoracic aorta at 111 HU's. Gantry rotation speed was 250 msecs and collimation was .6 mm. No beta blockade  or nitro were given. The 3D data set was reconstructed in 5% intervals of the R-R cycle. Systolic and diastolic phases were analyzed on a dedicated work station using MPR, MIP and VRT modes. The patient received 80 cc of contrast. FINDINGS: Aortic Valve: Trileaflet with severely calcified leaflets with moderately restricted leaflet opening and no calcifications extending into the LVOT. Aorta: Normal size with mild diffuse atherosclerotic plaque and calcifications and no dissection. Sinotubular Junction: 26 x 25 mm Ascending Thoracic Aorta: 33 x 32 mm Aortic Arch: 26 x 25 mm Descending Thoracic Aorta: 28 x 28 mm Sinus of Valsalva Measurements: Non-coronary: 31 mm Right -coronary: 31 mm Left -coronary: 34 mm Coronary Artery Height above Annulus: Left Main: 15 mm Right Coronary: 16 mm Virtual Basal Annulus Measurements: Maximum/Minimum Diameter: 29.3 x 22.9 mm Mean Diameter: 26.0 mm Perimeter: 84 mm Area: 532 mm2 Optimum Fluoroscopic Angle for Delivery: RAO 3 CRA 4. IMPRESSION: 1. Trileaflet aortic valve with severely calcified leaflets with moderately restricted leaflet opening and no calcifications extending into the LVOT.  Aortic valve calcium score 2271 consistent with severe aortic stenosis. Annular measurements suitable for delivery of 26 mm Edwards-SAPIEN 3 Ultra valve. 2. Sufficient coronary to annulus distance. 3. Optimum Fluoroscopic Angle for Delivery: RAO 3 CRA 4. 4. No thrombus in the left atrial appendage. Electronically Signed   By: [name]   On: 03/09/2020 16:59   Result Date: 03/09/2020 EXAM: OVER-READ INTERPRETATION  CT CHEST The following report is an over-read performed by radiologist Dr. Salvatore Marvel of [facility], [place] on 03/07/2020. This over-read does not include interpretation of cardiac or coronary anatomy or pathology. The coronary CTA interpretation by the cardiologist is attached. COMPARISON:  None. FINDINGS: Please see the separate concurrent chest CT angiogram report for details. IMPRESSION: Please see the separate concurrent chest CT angiogram report for details. Electronically Signed: By: Ilona Sorrel M.D. On: 03/07/2020 12:38   DG Chest Portable 1 View  Result Date: 03/03/2020 CLINICAL DATA:  Shortness of breath EXAM: PORTABLE CHEST 1 VIEW COMPARISON:  Radiograph 05/07/2010 FINDINGS: Shallow inspiratory effort with some atelectatic changes however there are more diffuse interstitial opacities throughout both lungs with more patchy airspace opacities in the lung bases. Vascularity is cephalized, indistinct. Few septal lines. Bilateral pleural effusions are present as well. No pneumothorax. Cardiac silhouette appears enlarged accounting for the portable technique and patient rotation likely reflecting either cardiomegaly or a pericardial effusion. The aorta is calcified. The remaining cardiomediastinal contours are unremarkable. No acute osseous or soft tissue abnormality. Degenerative changes are present in the imaged spine and shoulders. Telemetry leads overlie the chest. IMPRESSION: Features most likely reflect CHF/volume overload with cardiomegaly, interstitial and alveolar edema  and bilateral effusions. Underlying infection is not excluded. Electronically Signed   By: Lovena Le M.D.   On: 03/03/2020 16:14   CT ANGIO CHEST AORTA W/CM & OR WO/CM  Result Date: 03/07/2020 CLINICAL DATA:  Inpatient. Severe aortic stenosis. Acute on chronic heart failure. TAVR evaluation. EXAM: CT ANGIOGRAPHY CHEST, ABDOMEN AND PELVIS TECHNIQUE: Multidetector CT imaging through the chest, abdomen and pelvis was performed using the standard protocol during bolus administration of intravenous contrast. Multiplanar reconstructed images and MIPs were obtained and reviewed to evaluate the vascular anatomy. CONTRAST:  131mL OMNIPAQUE IOHEXOL 350 MG/ML SOLN COMPARISON:  03/04/2020 chest radiograph. FINDINGS: CTA CHEST FINDINGS Cardiovascular: Mild cardiomegaly. No significant pericardial effusion/thickening. Three-vessel coronary atherosclerosis. Atherosclerotic nonaneurysmal thoracic aorta, including ascending thoracic aortic atherosclerotic calcification. Diffuse thickening and coarse calcification of the aortic valve.  Normal caliber pulmonary arteries. No central pulmonary emboli. Mediastinum/Nodes: No discrete thyroid nodules. Unremarkable esophagus. No axillary adenopathy. Mild right paratracheal adenopathy up to 1.4 cm (series 6/image 60). Mildly enlarged 1.1 cm subcarinal node (series 6/image 70). Mild AP window adenopathy up to 1.3 cm (series 6/image 59). Mild bilateral hilar adenopathy up to 1.4 cm on the right (series 6/image 66) and 1.2 cm on the left (series 6/image 65). Lungs/Pleura: No pneumothorax. Moderate dependent bilateral pleural effusions. Moderate dependent bilateral lower lobe passive atelectasis. Mild diffuse interlobular septal thickening. No acute consolidative airspace disease, lung masses or significant pulmonary nodules. Musculoskeletal: No aggressive appearing focal osseous lesions. Incompletely healed posterior right ninth through twelfth rib fractures. Moderate thoracic  spondylosis. CTA ABDOMEN AND PELVIS FINDINGS Hepatobiliary: Normal liver with no liver mass. Normal gallbladder with no radiopaque cholelithiasis. No biliary ductal dilatation. Pancreas: Normal, with no mass or duct dilation. Spleen: Normal size. No mass. Adrenals/Urinary Tract: Normal adrenals. No hydronephrosis. Simple 2.5 cm anterior upper left renal cyst. Additional subcentimeter hypodense renal cortical lesions scattered in both kidneys are too small to characterize and require no follow-up. Excreted contrast in the bladder. Mild diffuse bladder wall thickening and trabeculation with tiny bladder diverticula. Stomach/Bowel: Normal non-distended stomach. Normal caliber small bowel with no small bowel wall thickening. Normal appendix. Moderate colorectal stool. No large bowel wall thickening, diverticulosis or significant pericolonic fat stranding. Vascular/Lymphatic: Atherosclerotic nonaneurysmal abdominal aorta. Mild porta hepatis adenopathy up to 1.3 cm (series 6/image 119). No pelvic adenopathy. Reproductive: Top-normal size prostate. Other: No pneumoperitoneum, ascites or focal fluid collection. Musculoskeletal: No aggressive appearing focal osseous lesions. Marked lumbar spondylosis. VASCULAR MEASUREMENTS PERTINENT TO TAVR: AORTA: Minimal Aortic Diameter-17.3 x 17.2 mm Severity of Aortic Calcification-moderate RIGHT PELVIS: Right Common Iliac Artery - Minimal Diameter-8.1 x 6.7 mm Tortuosity-moderate Calcification-severe Right External Iliac Artery - Minimal Diameter-8.4 x 7.8 mm Tortuosity-moderate Calcification-mild-to-moderate Right Common Femoral Artery - Minimal Diameter-9.5 x 8.0 mm Tortuosity-mild Calcification-severe LEFT PELVIS: Left Common Iliac Artery - Minimal Diameter-8.3 x 7.2 mm Tortuosity-moderate Calcification-severe Left External Iliac Artery - Minimal Diameter-7.6 x 6.8 mm Tortuosity-mild-to-moderate Calcification-moderate Left Common Femoral Artery - Minimal Diameter-9.9 x 8.0 mm  Tortuosity-mild Calcification-moderate Review of the MIP images confirms the above findings. IMPRESSION: 1. Vascular findings and measurements pertinent to potential TAVR procedure, as detailed. 2. Diffuse thickening and coarse calcification of the aortic valve, compatible with the reported history of severe aortic stenosis. 3. Mild cardiomegaly. Three-vessel coronary atherosclerosis. 4. Moderate dependent bilateral pleural effusions. 5. Mild diffuse interlobular septal thickening, compatible with mild pulmonary edema. 6. Mild porta hepatis, mild mediastinal and mild bilateral hilar lymphadenopathy, nonspecific, potentially reactive. Suggest follow-up CT chest, abdomen and pelvis with IV contrast in 3 months. 7. Mild diffuse bladder wall thickening and trabeculation with tiny bladder diverticula, suggesting nonspecific chronic bladder voiding dysfunction. 8. Moderate colorectal stool, suggesting constipation. 9. Incompletely healed posterior right ninth through twelfth rib fractures. 10. Aortic Atherosclerosis (ICD10-I70.0). Electronically Signed   By: Ilona Sorrel M.D.   On: 03/07/2020 13:03   ECHOCARDIOGRAM COMPLETE  Result Date: 03/04/2020    ECHOCARDIOGRAM REPORT   Patient Name:   THURMAN SARVER Date of Exam: 03/04/2020 Medical Rec #:  [id number]         Height:       73.0 in Accession #:    [id number]        Weight:       225.0 lb Date of Birth:  [birth year]         BSA:  2.262 m Patient Age:    88 years          BP:           137/47 mmHg Patient Gender: M                 HR:           50 bpm. Exam Location:  Inpatient Procedure: 2D Echo, Color Doppler, Cardiac Doppler and Intracardiac            Opacification Agent Indications:     O16.07 Acute diastolic (congestive) heart failure  History:         Patient has prior history of Echocardiogram examinations, most                  recent 03/14/2017. CHF, PAD and COPD; Risk Factors:Hypertension,                  Diabetes, Dyslipidemia, Sleep Apnea and  Current Smoker.  Sonographer:     Raquel Sarna Senior RDCS Referring Phys:  [place] Diagnosing Phys: Mertie Moores MD  Sonographer Comments: Technically difficult due to COPD. IMPRESSIONS  1. Left ventricular ejection fraction, by estimation, is 55 to 60%. The left ventricle has normal function. The left ventricle has no regional wall motion abnormalities. Left ventricular diastolic function could not be evaluated.  2. Right ventricular systolic function is normal. The right ventricular size is normal.  3. Left atrial size was moderately dilated.  4. Right atrial size was mildly dilated.  5. Large pleural effusion in the left lateral region.  6. The mitral valve is normal in structure. No evidence of mitral valve regurgitation.  7. The aortic valve is grossly normal. There is moderate calcification of the aortic valve. There is moderate thickening of the aortic valve. Aortic valve regurgitation is mild. Moderate to severe aortic valve stenosis. FINDINGS  Left Ventricle: Left ventricular ejection fraction, by estimation, is 55 to 60%. The left ventricle has normal function. The left ventricle has no regional wall motion abnormalities. Definity contrast agent was given IV to delineate the left ventricular  endocardial borders. The left ventricular internal cavity size was normal in size. There is no left ventricular hypertrophy. Left ventricular diastolic function could not be evaluated due to atrial fibrillation. Left ventricular diastolic function could  not be evaluated. Right Ventricle: The right ventricular size is normal. No increase in right ventricular wall thickness. Right ventricular systolic function is normal. Left Atrium: Left atrial size was moderately dilated. Right Atrium: Right atrial size was mildly dilated. Pericardium: There is no evidence of pericardial effusion. Mitral Valve: The mitral valve is normal in structure. No evidence of mitral valve regurgitation. Tricuspid Valve: The tricuspid  valve is grossly normal. Tricuspid valve regurgitation is not demonstrated. Aortic Valve: The aortic valve is grossly normal. There is moderate calcification of the aortic valve. There is moderate thickening of the aortic valve. There is moderate aortic valve annular calcification. Aortic valve regurgitation is mild. Aortic regurgitation PHT measures 524 msec. Moderate to severe aortic stenosis is present. Aortic valve mean gradient measures 36.5 mmHg. Aortic valve peak gradient measures 58.8 mmHg. Aortic valve area, by VTI measures 0.68 cm. Pulmonic Valve: The pulmonic valve was not well visualized. Pulmonic valve regurgitation is not visualized. Aorta: The aortic root and ascending aorta are structurally normal, with no evidence of dilitation. IAS/Shunts: The atrial septum is grossly normal. Additional Comments: There is a large pleural effusion in the left lateral region.  LEFT VENTRICLE PLAX 2D LVIDd:         5.70 cm  Diastology LVIDs:         4.00 cm  LV e' medial:    5.00 cm/s LV PW:         1.10 cm  LV E/e' medial:  16.8 LV IVS:        0.90 cm  LV e' lateral:   7.94 cm/s LVOT diam:     2.00 cm  LV E/e' lateral: 10.6 LV SV:         70 LV SV Index:   31 LVOT Area:     3.14 cm  RIGHT VENTRICLE RV S prime:     8.92 cm/s TAPSE (M-mode): 1.8 cm LEFT ATRIUM            Index       RIGHT ATRIUM           Index LA diam:      5.20 cm  2.30 cm/m  RA Area:     20.10 cm LA Vol (A2C): 66.8 ml  29.53 ml/m RA Volume:   64.90 ml  28.69 ml/m LA Vol (A4C): 103.0 ml 45.53 ml/m  AORTIC VALVE AV Area (Vmax):    0.66 cm AV Area (Vmean):   0.61 cm AV Area (VTI):     0.68 cm AV Vmax:           383.50 cm/s AV Vmean:          285.000 cm/s AV VTI:            1.030 m AV Peak Grad:      58.8 mmHg AV Mean Grad:      36.5 mmHg LVOT Vmax:         80.23 cm/s LVOT Vmean:        55.267 cm/s LVOT VTI:          0.224 m LVOT/AV VTI ratio: 0.22 AI PHT:            524 msec  AORTA Ao Root diam: 3.00 cm MITRAL VALVE MV Area (PHT): 2.65 cm     SHUNTS MV Decel Time: 286 msec    Systemic VTI:  0.22 m MV E velocity: 84.00 cm/s  Systemic Diam: 2.00 cm MV A velocity: 26.10 cm/s MV E/A ratio:  3.22 Mertie Moores MD Electronically signed by Mertie Moores MD Signature Date/Time: 03/04/2020/1:06:57 PM    Final (Updated)    ECHO TEE  Result Date: 03/09/2020    TRANSESOPHOGEAL ECHO REPORT   Patient Name:   YU PEGGS Date of Exam: 03/09/2020 Medical Rec #:  [id number]         Height:       73.0 in Accession #:    [id number]        Weight:       187.7 lb Date of Birth:  [birth year]         BSA:          2.094 m Patient Age:    23 years          BP:           157/36 mmHg Patient Gender: M                 HR:           53 bpm. Exam Location:  Inpatient Procedure: Transesophageal Echo, Color Doppler, Cardiac Doppler and 3D Echo Indications:     Aortic valve disorder  424.1 / I35.9  History:         Patient has prior history of Echocardiogram examinations, most                  recent 03/04/2020. CHF, COPD and TIA; Risk Factors:Hypertension,                  Diabetes, Dyslipidemia, Current Smoker and Sleep Apnea. GERD.  Sonographer:     Vickie Epley RDCS Referring Phys:  [place] Diagnosing Phys: [name] MD PROCEDURE: The transesophogeal probe was passed without difficulty through the esophogus of the patient. Sedation performed by different physician. The patient was monitored while under deep sedation. Anesthestetic sedation was provided intravenously by Anesthesiology: 250.2mg  of Propofol. Patients was under conscious sedation during this procedure. Anesthetic administered: 82mcg of Fentanyl. The patient's vital signs; including heart rate, blood pressure, and oxygen saturation; remained stable throughout the procedure. The patient developed no complications during the procedure. IMPRESSIONS  1. Moderately calcified and thickened aortic valve with moderately restricted leaflet openings, however transaortic gradients in the severe range (mean  gradient 43 mmHg) secondary to high flow sec to severe aortic regurgitation.  2. Left ventricular ejection fraction, by estimation, is 60 to 65%. The left ventricle has normal function. The left ventricle has no regional wall motion abnormalities.  3. Right ventricular systolic function is normal. The right ventricular size is normal.  4. Left atrial size was mildly dilated. No left atrial/left atrial appendage thrombus was detected.  5. The mitral valve is normal in structure. Trivial mitral valve regurgitation. No evidence of mitral stenosis.  6. The aortic valve is normal in structure. Aortic valve regurgitation is severe. Moderate to severe aortic valve stenosis. Aortic valve mean gradient measures 43.0 mmHg.  7. The inferior vena cava is normal in size with greater than 50% respiratory variability, suggesting right atrial pressure of 3 mmHg. Conclusion(s)/Recommendation(s): Normal biventricular function without evidence of hemodynamically significant valvular heart disease. FINDINGS  Left Ventricle: Left ventricular ejection fraction, by estimation, is 60 to 65%. The left ventricle has normal function. The left ventricle has no regional wall motion abnormalities. The left ventricular internal cavity size was normal in size. There is  no left ventricular hypertrophy. Right Ventricle: The right ventricular size is normal. No increase in right ventricular wall thickness. Right ventricular systolic function is normal. Left Atrium: Left atrial size was mildly dilated. No left atrial/left atrial appendage thrombus was detected. Right Atrium: Right atrial size was normal in size. Pericardium: There is no evidence of pericardial effusion. Mitral Valve: The mitral valve is normal in structure. Trivial mitral valve regurgitation. No evidence of mitral valve stenosis. Tricuspid Valve: The tricuspid valve is normal in structure. Tricuspid valve regurgitation is mild . No evidence of tricuspid stenosis. Aortic Valve: The  aortic valve is normal in structure. Aortic valve regurgitation is severe. Aortic regurgitation PHT measures 424 msec. Moderate to severe aortic stenosis is present. Aortic valve mean gradient measures 43.0 mmHg. Aortic valve peak gradient measures 73.8 mmHg. Pulmonic Valve: The pulmonic valve was normal in structure. Pulmonic valve regurgitation is not visualized. No evidence of pulmonic stenosis. Aorta: The aortic root is normal in size and structure. Venous: The inferior vena cava is normal in size with greater than 50% respiratory variability, suggesting right atrial pressure of 3 mmHg. IAS/Shunts: No atrial level shunt detected by color flow Doppler.  AORTIC VALVE AV Vmax:           429.50 cm/s AV  Vmean:          301.500 cm/s AV VTI:            0.874 m AV Peak Grad:      73.8 mmHg AV Mean Grad:      43.0 mmHg LVOT Vmax:         88.90 cm/s LVOT Vmean:        59.250 cm/s LVOT VTI:          0.165 m LVOT/AV VTI ratio: 0.19 AI PHT:            424 msec  SHUNTS Systemic VTI: 0.16 m [name] MD Electronically signed by [name] MD Signature Date/Time: 03/09/2020/3:18:40 PM    Final (Updated)    VAS US CAROTID  Result Date: 03/08/2020 Carotid Arterial Duplex Study Risk Factors:      Hypertension, Diabetes. Other Factors:     Aortic stenosis, pre-TAVR. Limitations        Today's exam was limited due to facial hair, body habitus and                    the patient's respiratory variation. Comparison Study:  No prior study on file Performing Technologist: Sharion Dove RVS  Examination Guidelines: A complete evaluation includes B-mode imaging, spectral Doppler, color Doppler, and power Doppler as needed of all accessible portions of each vessel. Bilateral testing is considered an integral part of a complete examination. Limited examinations for reoccurring indications may be performed as noted.  Right Carotid Findings: +----------+--------+--------+--------+------------------+------------------+            PSV cm/sEDV cm/sStenosisPlaque DescriptionComments           +----------+--------+--------+--------+------------------+------------------+ CCA Prox  63      9                                 intimal thickening +----------+--------+--------+--------+------------------+------------------+ CCA Distal63      7                                 intimal thickening +----------+--------+--------+--------+------------------+------------------+ ICA Prox  68      8               heterogenous                         +----------+--------+--------+--------+------------------+------------------+ ICA Distal89      14                                tortuous           +----------+--------+--------+--------+------------------+------------------+ ECA       47      3                                                    +----------+--------+--------+--------+------------------+------------------+ +----------+--------+-------+--------+-------------------+           PSV cm/sEDV cmsDescribeArm Pressure (mmHG) +----------+--------+-------+--------+-------------------+ [id number]                                         +----------+--------+-------+--------+-------------------+ +---------+--------+--+--------+--+  VertebralPSV cm/s51EDV cm/s14 +---------+--------+--+--------+--+  Left Carotid Findings: +----------+--------+--------+--------+------------------+------------------+           PSV cm/sEDV cm/sStenosisPlaque DescriptionComments           +----------+--------+--------+--------+------------------+------------------+ CCA Prox  89      17                                intimal thickening +----------+--------+--------+--------+------------------+------------------+ CCA Distal78      14                                intimal thickening +----------+--------+--------+--------+------------------+------------------+ ICA Prox  105     27              heterogenous                          +----------+--------+--------+--------+------------------+------------------+ ICA Distal77      18                                tortuous           +----------+--------+--------+--------+------------------+------------------+ ECA       138     12                                                   +----------+--------+--------+--------+------------------+------------------+ +----------+--------+--------+--------+-------------------+           PSV cm/sEDV cm/sDescribeArm Pressure (mmHG) +----------+--------+--------+--------+-------------------+ [id number]                                          +----------+--------+--------+--------+-------------------+ +---------+--------+--+--------+--+ VertebralPSV cm/s51EDV cm/s12 +---------+--------+--+--------+--+   Summary: Right Carotid: The extracranial vessels were near-normal with only minimal wall                thickening or plaque. Left Carotid: The extracranial vessels were near-normal with only minimal wall               thickening or plaque. Vertebrals:  Bilateral vertebral arteries demonstrate antegrade flow. Subclavians: Normal flow hemodynamics were seen in bilateral subclavian              arteries. *See table(s) above for measurements and observations.  Electronically signed by Servando Snare MD on 03/08/2020 at 11:04:14 AM.    Final    CT Angio Abd/Pel w/ and/or w/o  Result Date: 03/07/2020 CLINICAL DATA:  Inpatient. Severe aortic stenosis. Acute on chronic heart failure. TAVR evaluation. EXAM: CT ANGIOGRAPHY CHEST, ABDOMEN AND PELVIS TECHNIQUE: Multidetector CT imaging through the chest, abdomen and pelvis was performed using the standard protocol during bolus administration of intravenous contrast. Multiplanar reconstructed images and MIPs were obtained and reviewed to evaluate the vascular anatomy. CONTRAST:  167mL OMNIPAQUE IOHEXOL 350 MG/ML SOLN COMPARISON:  03/04/2020 chest radiograph.  FINDINGS: CTA CHEST FINDINGS Cardiovascular: Mild cardiomegaly. No significant pericardial effusion/thickening. Three-vessel coronary atherosclerosis. Atherosclerotic nonaneurysmal thoracic aorta, including ascending thoracic aortic atherosclerotic calcification. Diffuse thickening and coarse calcification of the aortic valve. Normal caliber pulmonary arteries. No central pulmonary emboli. Mediastinum/Nodes: No discrete thyroid nodules. Unremarkable esophagus. No axillary adenopathy. Mild right  paratracheal adenopathy up to 1.4 cm (series 6/image 60). Mildly enlarged 1.1 cm subcarinal node (series 6/image 70). Mild AP window adenopathy up to 1.3 cm (series 6/image 59). Mild bilateral hilar adenopathy up to 1.4 cm on the right (series 6/image 66) and 1.2 cm on the left (series 6/image 65). Lungs/Pleura: No pneumothorax. Moderate dependent bilateral pleural effusions. Moderate dependent bilateral lower lobe passive atelectasis. Mild diffuse interlobular septal thickening. No acute consolidative airspace disease, lung masses or significant pulmonary nodules. Musculoskeletal: No aggressive appearing focal osseous lesions. Incompletely healed posterior right ninth through twelfth rib fractures. Moderate thoracic spondylosis. CTA ABDOMEN AND PELVIS FINDINGS Hepatobiliary: Normal liver with no liver mass. Normal gallbladder with no radiopaque cholelithiasis. No biliary ductal dilatation. Pancreas: Normal, with no mass or duct dilation. Spleen: Normal size. No mass. Adrenals/Urinary Tract: Normal adrenals. No hydronephrosis. Simple 2.5 cm anterior upper left renal cyst. Additional subcentimeter hypodense renal cortical lesions scattered in both kidneys are too small to characterize and require no follow-up. Excreted contrast in the bladder. Mild diffuse bladder wall thickening and trabeculation with tiny bladder diverticula. Stomach/Bowel: Normal non-distended stomach. Normal caliber small bowel with no small bowel wall  thickening. Normal appendix. Moderate colorectal stool. No large bowel wall thickening, diverticulosis or significant pericolonic fat stranding. Vascular/Lymphatic: Atherosclerotic nonaneurysmal abdominal aorta. Mild porta hepatis adenopathy up to 1.3 cm (series 6/image 119). No pelvic adenopathy. Reproductive: Top-normal size prostate. Other: No pneumoperitoneum, ascites or focal fluid collection. Musculoskeletal: No aggressive appearing focal osseous lesions. Marked lumbar spondylosis. VASCULAR MEASUREMENTS PERTINENT TO TAVR: AORTA: Minimal Aortic Diameter-17.3 x 17.2 mm Severity of Aortic Calcification-moderate RIGHT PELVIS: Right Common Iliac Artery - Minimal Diameter-8.1 x 6.7 mm Tortuosity-moderate Calcification-severe Right External Iliac Artery - Minimal Diameter-8.4 x 7.8 mm Tortuosity-moderate Calcification-mild-to-moderate Right Common Femoral Artery - Minimal Diameter-9.5 x 8.0 mm Tortuosity-mild Calcification-severe LEFT PELVIS: Left Common Iliac Artery - Minimal Diameter-8.3 x 7.2 mm Tortuosity-moderate Calcification-severe Left External Iliac Artery - Minimal Diameter-7.6 x 6.8 mm Tortuosity-mild-to-moderate Calcification-moderate Left Common Femoral Artery - Minimal Diameter-9.9 x 8.0 mm Tortuosity-mild Calcification-moderate Review of the MIP images confirms the above findings. IMPRESSION: 1. Vascular findings and measurements pertinent to potential TAVR procedure, as detailed. 2. Diffuse thickening and coarse calcification of the aortic valve, compatible with the reported history of severe aortic stenosis. 3. Mild cardiomegaly. Three-vessel coronary atherosclerosis. 4. Moderate dependent bilateral pleural effusions. 5. Mild diffuse interlobular septal thickening, compatible with mild pulmonary edema. 6. Mild porta hepatis, mild mediastinal and mild bilateral hilar lymphadenopathy, nonspecific, potentially reactive. Suggest follow-up CT chest, abdomen and pelvis with IV contrast in 3 months. 7. Mild  diffuse bladder wall thickening and trabeculation with tiny bladder diverticula, suggesting nonspecific chronic bladder voiding dysfunction. 8. Moderate colorectal stool, suggesting constipation. 9. Incompletely healed posterior right ninth through twelfth rib fractures. 10. Aortic Atherosclerosis (ICD10-I70.0). Electronically Signed   By: Ilona Sorrel M.D.   On: 03/07/2020 13:03       Subjective: Patient seen and examined at bedside.  He feels better today and wants to go home.  Denies worsening shortness of breath, chest pain, nausea, vomiting or fevers. Discharge Exam: Vitals:   03/12/20 0439 03/12/20 0824  BP: (!) 119/55   Pulse: 68   Resp: 18   Temp: 99.1 F (37.3 C)   SpO2: 94% 95%    General: Pt is alert, awake, not in acute distress.  Elderly male sitting on chair. Cardiovascular: rate controlled, S1/S2 + Respiratory: bilateral decreased breath sounds at bases with some basilar crackles Abdominal: Soft, NT, ND,  bowel sounds + Extremities: Bilateral lower extremities are wrapped with some edema present; no cyanosis    The results of significant diagnostics from this hospitalization (including imaging, microbiology, ancillary and laboratory) are listed below for reference.     Microbiology: Recent Results (from the past 240 hour(s))  SARS Coronavirus 2 by RT PCR (hospital order, performed in [facility] hospital lab) Nasopharyngeal Nasopharyngeal Swab     Status: None   Collection Time: 03/03/20  4:03 PM   Specimen: Nasopharyngeal Swab  Result Value Ref Range Status   SARS Coronavirus 2 NEGATIVE NEGATIVE Final    Comment: (NOTE) SARS-CoV-2 target nucleic acids are NOT DETECTED.  The SARS-CoV-2 RNA is generally detectable in upper and lower respiratory specimens during the acute phase of infection. The lowest concentration of SARS-CoV-2 viral copies this assay can detect is 250 copies / mL. A negative result does not preclude SARS-CoV-2 infection and should not be  used as the sole basis for treatment or other patient management decisions.  A negative result may occur with improper specimen collection / handling, submission of specimen other than nasopharyngeal swab, presence of viral mutation(s) within the areas targeted by this assay, and inadequate number of viral copies (<250 copies / mL). A negative result must be combined with clinical observations, patient history, and epidemiological information.  Fact Sheet for Patients:   StrictlyIdeas.no  Fact Sheet for Healthcare Providers: BankingDealers.co.za  This test is not yet approved or  cleared by the [place] FDA and has been authorized for detection and/or diagnosis of SARS-CoV-2 by FDA under an Emergency Use Authorization (EUA).  This EUA will remain in effect (meaning this test can be used) for the duration of the COVID-19 declaration under Section 564(b)(1) of the Act, 21 U.S.C. section 360bbb-3(b)(1), unless the authorization is terminated or revoked sooner.  Performed at [facility], [place] [address], [place], [place] [address]      Labs: BNP (last 3 results) Recent Labs    03/03/20 1600  BNP 6,712.4*   Basic Metabolic Panel: Recent Labs  Lab 03/08/20 0751 03/08/20 0751 03/09/20 0255 03/10/20 0540 03/10/20 1415 03/11/20 1422 03/12/20 0637  NA 135  --  136 133*  --  135 132*  K 4.7  --  4.9 4.4  --  4.3 3.9  CL 100  --  100 97*  --  96* 95*  CO2 29  --  28 27  --  29 28  GLUCOSE 75   < > 76 96 247* 100* 96  BUN 20  --  21 23  --  17 20  CREATININE 0.97  --  1.10 1.23  --  1.18 1.18  CALCIUM 8.3*  --  8.0* 8.0*  --  8.3* 8.4*  MG  --   --   --   --   --   --  1.8   < > = values in this interval not displayed.   Liver Function Tests: No results for input(s): AST, ALT, ALKPHOS, BILITOT, PROT, ALBUMIN in the last 168 hours. No results for input(s): LIPASE, AMYLASE in the last 168 hours. No results for  input(s): AMMONIA in the last 168 hours. CBC: Recent Labs  Lab 03/07/20 0458 03/08/20 0751 03/09/20 0255 03/11/20 1422 03/12/20 0637  WBC 6.8 7.5 7.0 6.8 8.6  HGB 12.3* 11.7* 11.6* 12.1* 11.8*  HCT 39.1 37.4* 37.6* 39.3 37.0*  MCV 94.9 94.7 93.3 92.5 92.0  PLT 145* 144* 157 170 195   Cardiac Enzymes:  No results for input(s): CKTOTAL, CKMB, CKMBINDEX, TROPONINI in the last 168 hours. BNP: Invalid input(s): POCBNP CBG: Recent Labs  Lab 03/11/20 1905 03/11/20 2112 03/12/20 0018 03/12/20 0437 03/12/20 0750  GLUCAP 87 134* 111* 94 85   D-Dimer No results for input(s): DDIMER in the last 72 hours. Hgb A1c No results for input(s): HGBA1C in the last 72 hours. Lipid Profile No results for input(s): CHOL, HDL, LDLCALC, TRIG, CHOLHDL, LDLDIRECT in the last 72 hours. Thyroid function studies No results for input(s): TSH, T4TOTAL, T3FREE, THYROIDAB in the last 72 hours.  Invalid input(s): FREET3 Anemia work up No results for input(s): VITAMINB12, FOLATE, FERRITIN, TIBC, IRON, RETICCTPCT in the last 72 hours. Urinalysis No results found for: COLORURINE, APPEARANCEUR, [place], [place], [place], [place], [place], [place], PROTEINUR, UROBILINOGEN, NITRITE, LEUKOCYTESUR Sepsis Labs Invalid input(s): PROCALCITONIN,  WBC,  LACTICIDVEN Microbiology Recent Results (from the past 240 hour(s))  SARS Coronavirus 2 by RT PCR (hospital order, performed in [facility] hospital lab) Nasopharyngeal Nasopharyngeal Swab     Status: None   Collection Time: 03/03/20  4:03 PM   Specimen: Nasopharyngeal Swab  Result Value Ref Range Status   SARS Coronavirus 2 NEGATIVE NEGATIVE Final    Comment: (NOTE) SARS-CoV-2 target nucleic acids are NOT DETECTED.  The SARS-CoV-2 RNA is generally detectable in upper and lower respiratory specimens during the acute phase of infection. The lowest concentration of SARS-CoV-2 viral copies this assay can detect is 250 copies / mL. A negative result does not  preclude SARS-CoV-2 infection and should not be used as the sole basis for treatment or other patient management decisions.  A negative result may occur with improper specimen collection / handling, submission of specimen other than nasopharyngeal swab, presence of viral mutation(s) within the areas targeted by this assay, and inadequate number of viral copies (<250 copies / mL). A negative result must be combined with clinical observations, patient history, and epidemiological information.  Fact Sheet for Patients:   StrictlyIdeas.no  Fact Sheet for Healthcare Providers: BankingDealers.co.za  This test is not yet approved or  cleared by the [place] FDA and has been authorized for detection and/or diagnosis of SARS-CoV-2 by FDA under an Emergency Use Authorization (EUA).  This EUA will remain in effect (meaning this test can be used) for the duration of the COVID-19 declaration under Section 564(b)(1) of the Act, 21 U.S.C. section 360bbb-3(b)(1), unless the authorization is terminated or revoked sooner.  Performed at [facility], [place] [address], [place], [place] [address]      Time coordinating discharge: 35 minutes  SIGNED:   Aline August, MD  [facility] 03/12/2020, 9:48 AM

## 2020-03-12 NOTE — Progress Notes (Addendum)
Progress Note  Patient Name: [place] [name] Date of Encounter: 03/12/2020  [facility] Cardiologist: Candee Furbish, MD   Subjective   No acute overnight events. Patient states breathing a little better than yesterday. He notes he still has some shortness of breath at night. He has known sleep apnea but has not been using CPAP recently at home. No chest pain. No palpitations, lightheadedness, or dizziness.  Inpatient Medications    Scheduled Meds: . aspirin EC  81 mg Oral Daily  . atorvastatin  80 mg Oral QPC supper  . budesonide (PULMICORT) nebulizer solution  0.25 mg Nebulization BID  . feeding supplement (ENSURE ENLIVE)  237 mL Oral BID BM  . folic acid  1 mg Oral Daily  . furosemide  40 mg Oral Daily  . hydrALAZINE  25 mg Oral Q8H  . hydrocerin   Topical TID  . multivitamin with minerals  1 tablet Oral Daily  . pantoprazole  40 mg Oral Daily  . sodium chloride flush  3 mL Intravenous Q12H  . sodium chloride flush  3 mL Intravenous Q12H  . tamsulosin  0.4 mg Oral Daily  . venlafaxine XR  150 mg Oral QPC supper   Continuous Infusions: . sodium chloride    . lactated ringers 10 mL/hr at 03/09/20 1353   PRN Meds: sodium chloride, acetaminophen, butalbital-acetaminophen-caffeine, ipratropium-albuterol, LORazepam, ondansetron (ZOFRAN) IV, sodium chloride flush   Vital Signs    Vitals:   03/11/20 1908 03/11/20 2030 03/11/20 2200 03/12/20 0439  BP: (!) 131/46  (!) 112/45 (!) 119/55  Pulse: 84 82 81 68  Resp: 18 14  18   Temp: 98.6 F (37 C)   99.1 F (37.3 C)  TempSrc: Oral   Oral  SpO2: 93% 92% 92% 94%  Weight:      Height:        Intake/Output Summary (Last 24 hours) at 03/12/2020 0823 Last data filed at 03/11/2020 2158 Gross per 24 hour  Intake 403 ml  Output 4925 ml  Net -4522 ml   Last 3 Weights 03/11/2020 03/10/2020 03/09/2020  Weight (lbs) 188 lb 3.2 oz 188 lb 1.6 oz 187 lb 9.8 oz  Weight (kg) 85.367 kg 85.322 kg 85.1 kg      Telemetry    Sinus  rhythm with rates in the 60's to 80's. - Personally Reviewed  ECG    No new ECG tracings today. - Personally Reviewed  Physical Exam   GEN: No acute distress.   Neck: No JVD Cardiac: RRR. III/VI systolic murmur noted throughout. No rubs or gallops.  Respiratory: No increased work of breathing. Decreased breath sounds in bases but no significant wheezes, rhonchi, or rales appreciated. GI: Soft, non-tender, non-distended. Bowel sounds present. MS: Mild lower extremity edema bilaterally. Both legs ar wrapped in ACE wrap. No deformity. Neuro:  No focal deficits. Psych: Normal affect. Responds appropriately.  Labs    High Sensitivity Troponin:  No results for input(s): TROPONINIHS in the last 720 hours.    Chemistry Recent Labs  Lab 03/10/20 0540 03/10/20 0540 03/10/20 1415 03/11/20 1422 03/12/20 0637  NA 133*  --   --  135 132*  K 4.4  --   --  4.3 3.9  CL 97*  --   --  96* 95*  CO2 27  --   --  29 28  GLUCOSE 96   < > 247* 100* 96  BUN 23  --   --  17 20  CREATININE 1.23  --   --  1.18 1.18  CALCIUM 8.0*  --   --  8.3* 8.4*  GFRNONAA 60*  --   --  >60 >60  GFRAA >60  --   --  >60 >60  ANIONGAP 9  --   --  10 9   < > = values in this interval not displayed.     Hematology Recent Labs  Lab 03/09/20 0255 03/11/20 1422 03/12/20 0637  WBC 7.0 6.8 8.6  RBC 4.03* 4.25 4.02*  HGB 11.6* 12.1* 11.8*  HCT 37.6* 39.3 37.0*  MCV 93.3 92.5 92.0  MCH 28.8 28.5 29.4  MCHC 30.9 30.8 31.9  RDW 14.6 14.8 14.8  PLT 157 170 195    BNPNo results for input(s): BNP, PROBNP in the last 168 hours.   DDimer No results for input(s): DDIMER in the last 168 hours.   Radiology    No results found.  Cardiac Studies   TTE 03/04/2020: Impressions: 1. Left ventricular ejection fraction, by estimation, is 55 to 60%. The  left ventricle has normal function. The left ventricle has no regional  wall motion abnormalities. Left ventricular diastolic function could not  be evaluated.   2. Right ventricular systolic function is normal. The right ventricular  size is normal.  3. Left atrial size was moderately dilated.  4. Right atrial size was mildly dilated.  5. Large pleural effusion in the left lateral region.  6. The mitral valve is normal in structure. No evidence of mitral valve  regurgitation.  7. The aortic valve is grossly normal. There is moderate calcification of  the aortic valve. There is moderate thickening of the aortic valve. Aortic  valve regurgitation is mild. Moderate to severe aortic valve stenosis.  _______________  Right/Left Heart Catheterization 03/06/2020: 1.  Nonobstructive coronary artery disease with mild nonobstructive plaquing in the RCA, wide patency of the left main, wide patency of the LAD, and mild to moderate mid circumflex stenosis 2.  Moderate aortic stenosis with mean gradient 26 mmHg and calculated aortic valve area 1.6 cm. 3.  Moderate pulmonary hypertension with mean PA pressure 34 mmHg, transpulmonary gradient 13 mmHg, PVR less than 2 Wood units _______________  TEE 03/09/2020: Impressions: 1. Moderately calcified and thickened aortic valve with moderately  restricted leaflet openings, however transaortic gradients in the severe  range (mean gradient 43 mmHg) secondary to high flow sec to severe aortic  regurgitation.  2. Left ventricular ejection fraction, by estimation, is 60 to 65%. The  left ventricle has normal function. The left ventricle has no regional  wall motion abnormalities.  3. Right ventricular systolic function is normal. The right ventricular  size is normal.  4. Left atrial size was mildly dilated. No left atrial/left atrial  appendage thrombus was detected.  5. The mitral valve is normal in structure. Trivial mitral valve  regurgitation. No evidence of mitral stenosis.  6. The aortic valve is normal in structure. Aortic valve regurgitation is  severe. Moderate to severe aortic valve stenosis.  Aortic valve mean  gradient measures 43.0 mmHg.  7. The inferior vena cava is normal in size with greater than 50%  respiratory variability, suggesting right atrial pressure of 3 mmHg.   Conclusion(s)/Recommendation(s): Normal biventricular function without  evidence of hemodynamically significant valvular heart disease.  Patient Profile     68 y.o. male with a history of hypertension, type 2 diabetes mellitus, COPD, obstructive sleep apnea on CPAP, and obesity who was admitted with acute on chronic diastolic CHF now undergoing TAVR work-up.  Assessment &  Plan   Acute on Chronic Diastolic CHF - BNP elevated at 2,454.  - Initial chest x-ray consistent with CHF with interstitial and alveolar edema as well as bilateral effusions. Repeat chest x-ray on 03/09/2020 showed stable bilateral effusion and adjacent atelectatic changes. - Echo showed LVEF of 55-60% with normal wall motions, and moderate to severe aortic stenosis with AI .  - R/LHC on 9/17 showed showed non-obstructive CAD with moderate AS (mean gradient of 26 mmHg and aortic valve area of 1.6cm^2) and moderate pulmonary HTN. - Patient initially diuresed with IV Lasix but this had to be discontinued due to rising creatinine. Creatinine ultimately improved and home Lasix was restarted on 03/09/2020 but did receive another dose of IV Lasix yesterday due to course breath sounds.  - ReDS Clip reading 23% today so OK to continue PO Lasix 40mg  daily. Does not appear significantly volume overloaded on exam so no additional IV Lasix needed at this time. - Recommend monitoring daily weights and sodium/fluid restriction at discharge.  Severe AI/Moderate AS - TEE on 03/09/2020 showed moderately calcified and thickened aortic valve with moderately restricted leaflet opening. Transaortic gradients in severe range (mean gradient 43 mmHg) secondary to high flow secondary to severe aortic regurgitation. - Dr. Burt Knack saw patient this admission and plan is  to continued rest of TAVR vs SAVR work-up as outpatient. He has appointment with Dr. Cyndia Bent scheduled for next month.  Bradycardia Paroxysmal SVT - Patient noted to have some mild bradycardia this admission so Atenolol was was held. Patient did have very brief run of SVT yesterday but no recurrent of this.  - Heart rates in the 60's to 80's today.  - Continue to monitor on telemetry while admitted.   Hypertension - BP well controlled. - Home Atenolol and Aliskiren are on hold. - Continue Hydralazine 25mg  three times daily.  Hyperlipidemia - Continue Lipitor.  Hypoglycemia - Improving. - Management per primary team.  Disposition: Likely stable for discharge from cardiac standpoint. Dr. Percival Spanish to follow with any additional recommendations. Patient already has close follow-up with [name], PA, arranged for 03/18/2020.  For questions or updates, please contact [place] Please consult [email or web address] for contact info under   History and all data above reviewed.  Patient examined.  I agree with the findings as above.   He feels well and is ready for discharge.   The patient exam reveals COR:RRR, systolic murmur unchanged  ,  Lungs: Clear  ,  Abd: Positive bowel sounds, no rebound no guarding, Ext Mild edema  .  All available labs, radiology testing, previous records reviewed. Agree with documented assessment and plan.   OK for discharge with meds as on MAR.  No further in hospital work up.  Follow up has been arranged.    Jeneen Rinks Beyza Bellino  10:56 AM  03/12/2020       Signed, Darreld Mclean, PA-C  03/12/2020, 8:23 AM

## 2020-03-12 NOTE — Progress Notes (Signed)
Orthopedic Tech Progress Note Patient Details:  Jordan [name] [birth year] [id number]  Ortho Devices Type of Ortho Device: Louretta Parma boot Ortho Device/Splint Location: BLE Ortho Device/Splint Interventions: Ordered, Application   Post Interventions Patient Tolerated: Well Instructions Provided: Care of device, Poper ambulation with device   Jordan [name] 03/12/2020, 5:47 PM

## 2020-03-12 NOTE — Progress Notes (Signed)
Student nurse reported to RN that iv site was inflamed.  RN instructed student nurse to remove IV (Right AC), upon removal pus noted.  Dr. Starla Link paged, awaiting call back.

## 2020-03-18 ENCOUNTER — Ambulatory Visit (INDEPENDENT_AMBULATORY_CARE_PROVIDER_SITE_OTHER): Payer: Medicare PPO | Admitting: Physician Assistant

## 2020-03-18 ENCOUNTER — Encounter: Payer: Self-pay | Admitting: Physician Assistant

## 2020-03-18 ENCOUNTER — Other Ambulatory Visit: Payer: Self-pay

## 2020-03-18 ENCOUNTER — Telehealth: Payer: Self-pay | Admitting: Orthopedic Surgery

## 2020-03-18 VITALS — BP 154/66 | HR 56 | Ht 73.0 in | Wt 183.6 lb

## 2020-03-18 DIAGNOSIS — Z720 Tobacco use: Secondary | ICD-10-CM

## 2020-03-18 DIAGNOSIS — I2510 Atherosclerotic heart disease of native coronary artery without angina pectoris: Secondary | ICD-10-CM

## 2020-03-18 DIAGNOSIS — I10 Essential (primary) hypertension: Secondary | ICD-10-CM

## 2020-03-18 DIAGNOSIS — I5032 Chronic diastolic (congestive) heart failure: Secondary | ICD-10-CM | POA: Diagnosis not present

## 2020-03-18 DIAGNOSIS — I359 Nonrheumatic aortic valve disorder, unspecified: Secondary | ICD-10-CM

## 2020-03-18 DIAGNOSIS — E782 Mixed hyperlipidemia: Secondary | ICD-10-CM

## 2020-03-18 DIAGNOSIS — R001 Bradycardia, unspecified: Secondary | ICD-10-CM

## 2020-03-18 DIAGNOSIS — I872 Venous insufficiency (chronic) (peripheral): Secondary | ICD-10-CM

## 2020-03-18 MED ORDER — ATENOLOL 25 MG PO TABS: 25.0000 mg | ORAL_TABLET | Freq: Every day | ORAL | 3 refills | Status: DC

## 2020-03-18 MED ORDER — HYDRALAZINE HCL 50 MG PO TABS
50.0000 mg | ORAL_TABLET | Freq: Three times a day (TID) | ORAL | 3 refills | Status: DC
Start: 2020-03-18 — End: 2020-03-18

## 2020-03-18 MED ORDER — HYDRALAZINE HCL 50 MG PO TABS
50.0000 mg | ORAL_TABLET | Freq: Three times a day (TID) | ORAL | 3 refills | Status: DC
Start: 2020-03-18 — End: 2020-07-23

## 2020-03-18 NOTE — Telephone Encounter (Signed)
Spoke with Diane.  She is following patient for recent hospitalization after a pleural effusion.  She just wanted to make sure there was no particular care with his lower extremities.  I explained to her that I see him once monthly for his venous stasis of his lower extremity and this has been stable with his compression socks.  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  He should continue to follow-up with [place] on a monthly basis or sooner if there is any concerns

## 2020-03-18 NOTE — Progress Notes (Signed)
Cardiology Office Note:    Date:  03/18/2020   ID:  [name], DOB 01-08-1951, MRN [id number]  PCP:  [name], MD  [facility] Cardiologist:  Candee Furbish, MD   [facility] Electrophysiologist:  None  Structural [facility] Clinic:  Sherren Mocha, MD    Referring MD: [name], MD   Chief Complaint:  Hospitalization Follow-up (Acute CHF in [place] setting of aortic valve disease)    [name] Profile:    [place] [name] is a 69 y.o. male with:   Chronic diastolic CHF  Echo 5/42: EF 55-60  Coronary artery disease  Moderate nonobstructive by cath 9/21  Aortic valve disease  Severe aortic insufficiency, moderate to severe aortic stenosis  Mean gradient by cath 9/21: 26 mmHg  COPD  Hypertension  Hyperlipidemia  Diabetes mellitus  Tobacco abuse  OSA  GERD  Venous insufficiency  Prior CV studies: Transesophageal echocardiogram 03/09/2020 EF 60-65, no RWMA, normal RVSF, mild LAE, trivial MR, severe AI, moderate-severe aortic stenosis (mean gradient 43 mmHg), DI 0.19  Carotid US 03/07/2020 Minimal bilateral plaque without significant ICA stenosis  Cardiac catheterization 03/06/2020 LAD moderate calcified LCx proximal 50 RCA mid 30, distal 30 Mean aortic valve gradient 26 mmHg, calculated AVA 1.6 cm Moderate pulmonary hypertension, mean PA pressure 34 mmHg  Echocardiogram 03/04/2020 EF 55-60, no RWMA, normal RVSF, moderate LAE, mild RAE, large left pleural effusion, mild AI, moderate to severe aortic stenosis (mean gradient 36.5 mmHg), DI 0.22  History of Present Illness:    [place] 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    [place] [name] was admitted 9/14-9/23 with acute hypoxic respiratory failure in [place] setting of acute diastolic CHF and mixed aortic valve disease with severe AI and mod to severe AS.  He presented with  worsening shortness of breath and was noted to have pulmonary edema and bilateral pleural effusions on chest x-ray.  He was diuresed with IV Lasix and this was complicated by acute kidney injury.  He was ultimately placed back on oral furosemide.  Echocardiogram demonstrated normal ejection fraction and moderate to severe aortic stenosis.  Transesophageal echocardiogram confirmed severe aortic insufficiency and moderate to severe aortic stenosis with a mean gradient 43 mmHg.  Cardiac catheterization demonstrated nonobstructive CAD and evidence of moderate aortic stenosis with a mean gradient 26 mmHg.  He was evaluated by [place] structural heart team.  Dr. Burt Knack noted that he would be suitable candidate for TAVR and he has been set up for an outpatient appointment with Dr. Cyndia Bent with cardiothoracic surgery.  He will need Jordan remainder of his teeth extracted prior to proceeding with aortic valve surgery.  Of note, [place] [name] had some bradycardia and his beta-blocker was stopped.    He returns for follow-up.  He is here alone.  He has been doing well since DC.  He has not had any weight gain, chest pain, significant shortness of breath, orthopnea, syncope.  His legs are wrapped. He is followed by Dr. Sharol Given for venous insufficiency.   He has not had any bleeding issues.  Home health noted an elevated BP and his PCP put him back on Atenolol yesterday.    Past Medical History:  Diagnosis Date  . Anxiety   . Aortic valve disease    Severe aortic insufficiency, moderate to severe aortic stenosis  . Arthritis   . CAD (coronary artery disease)  cath 9/21: LAD calcified, pLCx 50, mRCA 30, dRCA 30  . Chronic diastolic CHF   . COPD (chronic obstructive pulmonary disease) ([place])   . Depression   . Diabetes mellitus without complication ([place])   . GERD (gastroesophageal reflux disease)   . Hyperlipidemia   . Hypertension   . Migraine   . Obesity   . Sleep apnea    CPAP   . Venous insufficiency    managed by  ortho (Dr. Sharol Given)    Current Medications: Current Meds  Medication Sig  . aspirin 81 MG tablet Take 81 mg by mouth daily.  Marland Kitchen atorvastatin (LIPITOR) 80 MG tablet Take 80 mg by mouth daily after supper.   . budesonide-formoterol (SYMBICORT) 160-4.5 MCG/ACT inhaler Inhale 2 puffs into Jordan lungs in Jordan morning and at bedtime.  . butalbital-acetaminophen-caffeine (FIORICET, ESGIC) 50-325-40 MG per tablet Take 1 tablet by mouth 2 (two) times daily as needed for pain or headache.  . doxepin (SINEQUAN) 50 MG capsule Take 50 mg by mouth in [place] morning and at bedtime.  . folic acid (FOLVITE) 1 MG tablet Take 1 tablet (1 mg total) by mouth daily.  . furosemide (LASIX) 40 MG tablet Take 1 tablet (40 mg total) by mouth daily.  . hydrALAZINE (APRESOLINE) 50 MG tablet Take 1 tablet (50 mg total) by mouth every 8 (eight) hours.  Marland Kitchen ipratropium-albuterol (DUONEB) 0.5-2.5 (3) MG/3ML SOLN Take 3 mLs by nebulization every 6 (six) hours as needed (shortness of breath/wheezing).  . LORazepam (ATIVAN) 1 MG tablet Take 0.5 tablets (0.5 mg total) by mouth 2 (two) times daily as needed for anxiety.  . montelukast (SINGULAIR) 10 MG tablet Take 10 mg by mouth daily.  . Multiple Vitamin (MULTIVITAMIN WITH MINERALS) TABS Take 1 tablet by mouth daily.  Marland Kitchen omeprazole (PRILOSEC) 20 MG capsule Take 20 mg by mouth daily.  . tamsulosin (FLOMAX) 0.4 MG CAPS capsule Take 0.4 mg by mouth daily.  Marland Kitchen venlafaxine XR (EFFEXOR-XR) 75 MG 24 hr capsule Take 150 mg by mouth daily after supper. BRAND NAME ONLY  . [DISCONTINUED] atenolol (TENORMIN) 50 MG tablet Take 50 mg by mouth daily.  . [DISCONTINUED] hydrALAZINE (APRESOLINE) 25 MG tablet Take 1 tablet (25 mg total) by mouth every 8 (eight) hours.  . [DISCONTINUED] hydrALAZINE (APRESOLINE) 50 MG tablet Take 1 tablet (50 mg total) by mouth every 8 (eight) hours.     Allergies:   Altace [ramipril], Codeine, and Naproxen   Social History   Tobacco Use  . Smoking status: Current Some Day  Smoker    Packs/day: 2.00    Years: 20.00    Pack years: 40.00    Types: Cigarettes  . Smokeless tobacco: Never Used  Vaping Use  . Vaping Use: Never used  Substance Use Topics  . Alcohol use: No    Alcohol/week: 0.0 standard drinks  . Drug use: No     Family Hx: [place] [name]'s family history includes Alzheimer's disease in his mother; Congestive Heart Failure in his father; Diabetes in an other family member; Heart attack in his father; Hypertension in his mother; Stroke in his father. There is no history of Colon cancer, Rectal cancer, or Esophageal cancer.  ROS   EKGs/Labs/Other Test Reviewed:    EKG:  EKG is   ordered today.  Jordan ekg ordered today demonstrates sinus bradycardia, HR 47, normal axis, poor R wave progression, LVH, QTC 401  Recent Labs: 03/03/2020: ALT 13; B Natriuretic Peptide 2,454.0 03/12/2020: BUN 20; Creatinine, Ser 1.18; Hemoglobin  11.8; Magnesium 1.8; Platelets 195; Potassium 3.9; Sodium 132   Recent Lipid Panel Lab Results  Component Value Date/Time   CHOL 108 03/06/2020 05:08 AM   TRIG 68 03/06/2020 05:08 AM   HDL 30 (L) 03/06/2020 05:08 AM   CHOLHDL 3.6 03/06/2020 05:08 AM   LDLCALC 64 03/06/2020 05:08 AM    Physical Exam:    VS:  BP (!) 154/66   Pulse (!) 56   Ht 6\' 1"  (1.854 m)   Wt 183 lb 9.6 oz (83.3 kg)   BMI 24.22 kg/m     Wt Readings from Last 3 Encounters:  03/18/20 183 lb 9.6 oz (83.3 kg)  03/11/20 188 lb 3.2 oz (85.4 kg)  12/26/19 225 lb (102.1 kg)     Constitutional:      Appearance: Healthy appearance. Not in distress.  Pulmonary:     Effort: Pulmonary effort is normal.     Breath sounds: No wheezing. No rales.  Cardiovascular:     Normal rate. Regular rhythm. Normal S1. Normal S2.     Murmurs: There is a grade 3/6 crescendo-decrescendo systolic murmur at Jordan URSB and LLSB.  Edema:    Pretibial: bilateral 1+ edema of [place] pretibial area.    Ankle: bilateral 1+ edema of [place] ankle.    Comments: bilat LE wrapped with ACE  wrap Abdominal:     Palpations: Abdomen is soft.  Musculoskeletal:     Cervical back: Neck supple. Skin:    General: Skin is warm and dry.  Neurological:     General: No focal deficit present.     Mental Status: Alert and oriented to person, place and time.     Cranial Nerves: Cranial nerves are intact.       ASSESSMENT & PLAN:    1. Chronic diastolic CHF (congestive heart failure) ([place]) Status post recent admission with acute CHF in [place] setting of aortic valve disease.  Overall, he has done well since discharge.  His weights have been stable.  His breathing is stable.  His lower extremity swelling is mainly related to venous insufficiency.  We discussed Jordan importance of daily weights and when to contact us for further management.  Continue current dose of furosemide.  Obtain follow-up BMET today.  2. Aortic valve disease He continues to be evaluated for TAVR.  He saw Dr. Burt Knack in [place] hospital.  He has an appoint with Dr. Cyndia Bent next month.  He plans to make an appointment with [place] dentist soon to discuss extraction of rest of his teeth.  He is hopeful that he will be a candidate for TAVR and not have to go through open heart surgery.  3. Coronary artery disease involving native coronary artery of native heart without angina pectoris Moderate nonobstructive disease by cardiac catheterization during recent hospitalization.  He is not having anginal symptoms.  Continue aspirin, atorvastatin.  4. Essential hypertension Blood pressure above goal.  Due to bradycardia, his atenolol needs to be decreased.  Increase hydralazine to 50 mg 3 times a day.  5. Mixed hyperlipidemia Managed by primary care.  Continue high intensity statin therapy.  6. Chronic venous insufficiency He is managed by Dr. Sharol Given with orthopedics.  His legs are currently wrapped with Ace wrap.  7. Bradycardia He is asymptomatic.  He was just placed back on atenolol yesterday.  Decrease atenolol to 25 mg daily.  He knows  to contact us if he has symptoms of dizziness or near syncope.  8. Tobacco abuse He plans to quit.  Dispo:  Return in about 3 months (around 06/17/2020) for Routine Follow Up with Dr. Marlou Porch, in person.   Medication Adjustments/Labs and Tests Ordered: Current medicines are reviewed at length with [place] [name] today.  Concerns regarding medicines are outlined above.  Tests Ordered: Orders Placed This Encounter  Procedures  . Basic metabolic panel  . EKG 12-Lead   Medication Changes: Meds ordered this encounter  Medications  . atenolol (TENORMIN) 25 MG tablet    Sig: Take 1 tablet (25 mg total) by mouth daily.    Dispense:  90 tablet    Refill:  3  . DISCONTD: hydrALAZINE (APRESOLINE) 50 MG tablet    Sig: Take 1 tablet (50 mg total) by mouth every 8 (eight) hours.    Dispense:  270 tablet    Refill:  3  . hydrALAZINE (APRESOLINE) 50 MG tablet    Sig: Take 1 tablet (50 mg total) by mouth every 8 (eight) hours.    Dispense:  270 tablet    Refill:  3    Signed, [name], PA-C  03/18/2020 3:23 PM    [facility] [place], [place], [place]  [address] Phone: [phone]; Fax: [phone]

## 2020-03-18 NOTE — Telephone Encounter (Signed)
Dianne from [facility] called.   She was made aware that the patient was hospitalized last week and now has a wound Dr.Duda has been treating. She is requesting a call back to discuss the matter and gain more insight.   Call back: [phone]

## 2020-03-18 NOTE — Patient Instructions (Addendum)
Medication Instructions:  Your physician has recommended you make the following change in your medication:   1) Decrease Atenolol to 25 mg 1 tablet by mouth once a day 2) Increase Hydralazine to 50 mg 1 tablet by mouth three times a day  *If you need a refill on your cardiac medications before your next appointment, please call your pharmacy*  Lab Work: You will have labs drawn today: BMET  If you have labs (blood work) drawn today and your tests are completely normal, you will receive your results only by: Marland Kitchen MyChart Message (if you have MyChart) OR . A paper copy in the mail If you have any lab test that is abnormal or we need to change your treatment, we will call you to review the results.  Testing/Procedures: None ordered today  Follow-Up: On 06/23/2020 at 2:40PM with Candee Furbish, MD

## 2020-03-19 LAB — BASIC METABOLIC PANEL
BUN/Creatinine Ratio: 20 (ref 10–24)
BUN: 18 mg/dL (ref 8–27)
CO2: 24 mmol/L (ref 20–29)
Calcium: 8.7 mg/dL (ref 8.6–10.2)
Chloride: 100 mmol/L (ref 96–106)
Creatinine, Ser: 0.92 mg/dL (ref 0.76–1.27)
GFR calc Af Amer: 98 mL/min/{1.73_m2} (ref >59)
GFR calc non Af Amer: 85 mL/min/{1.73_m2} (ref >59)
Glucose: 57 mg/dL — ABNORMAL LOW (ref 65–99)
Potassium: 5.3 mmol/L — ABNORMAL HIGH (ref 3.5–5.2)
Sodium: 139 mmol/L (ref 134–144)

## 2020-03-24 ENCOUNTER — Telehealth: Payer: Self-pay | Admitting: Physician Assistant

## 2020-03-24 NOTE — Telephone Encounter (Signed)
  [facility]   MULTIDISCIPLINARY HEART VALVE TEAM   Called to discuss ongoing TAVR work up pending dental eval with Dr. Stefanie Libel. I spoke to him last week and he said he was too busy to make the phone call. I call him back today and he reported still recovering from his recent admission and hadn't felt up to going to see the oral surgeon. He said he will call today today make an apt with Dr. Stefanie Libel and call us back with an apt time. He has an apt with Dr. Cyndia Bent on 10/20.   Angelena Form PA-C  [facility]

## 2020-03-25 NOTE — Telephone Encounter (Signed)
Patient called back and reported making the first available appt with Dr. Stefanie Libel on 10/28 @ 1:30pm.   Angelena Form PA-C  [facility]

## 2020-04-02 ENCOUNTER — Telehealth: Payer: Self-pay | Admitting: Physician Assistant

## 2020-04-02 NOTE — Telephone Encounter (Signed)
Patient called. Would like Bevely Palmer to call him. Just wanted to tell her why he hasn't been here. [phone]

## 2020-04-03 NOTE — Telephone Encounter (Signed)
I called and sw pt and has been in the hospital recently and has had a heart cath and has a leaking aortic valve and is sch for surgical consult with cards 04/08/20 and that he will be back in the office once this has resolved. Advised pt that we are happy to see him whenever he needs [place] and that I have relayed this information to the PA and to keep [place] updated on his progress.

## 2020-04-08 ENCOUNTER — Encounter: Payer: Self-pay | Admitting: Surgery

## 2020-04-08 ENCOUNTER — Institutional Professional Consult (permissible substitution): Payer: Medicare PPO | Admitting: Surgery

## 2020-04-08 ENCOUNTER — Other Ambulatory Visit: Payer: Self-pay

## 2020-04-08 VITALS — BP 150/52 | HR 64 | Temp 97.6°F | Resp 20 | Ht 73.0 in | Wt 186.0 lb

## 2020-04-08 DIAGNOSIS — I350 Nonrheumatic aortic (valve) stenosis: Secondary | ICD-10-CM

## 2020-04-08 NOTE — Progress Notes (Signed)
Patient ID: Jordan [name], male   DOB: [birth year], 69 y.o.   MRN: [id number]  [facility] CONSULTATION REPORT  Referring Provider is Jordan Pain, MD Primary Cardiologist is Jordan Furbish, MD PCP is Jordan Bowen, MD  Chief Complaint  Patient presents with  . Aortic Stenosis    Surgical consult for TAVR vs SAVR, review all testing    HPI:  The patient is a 69 year old gentleman with a history of diabetes, hypertension, hyperlipidemia, OSA on CPAP, chronic venous insufficiency with bilateral lower extremity edema and history of venous stasis ulcers, active 1 pack/day smoking with COPD, and arthritis who was admitted in September 2021 with acute hypoxic respiratory failure due to acute diastolic congestive heart failure and aortic valve disease.  He presented with a couple week history of progressive exertional shortness of breath and chest x-ray on admission showed pulmonary edema and bilateral pleural effusions.  He improved with IV diuresis which was complicated by acute kidney injury.  This resolved and he was maintained on oral Lasix.  He had a 2D echocardiogram on 03/04/2020 which had poor windows but reportedly showed mild aortic insufficiency and moderate to severe aortic stenosis with a mean gradient of 36.5 mmHg and a valve area of 0.68 cm.  He subsequently had a TEE done on 03/09/2020 which showed a calcified and thickened aortic valve with restricted leaflet opening and a mean gradient of 43 mmHg.  There was severe aortic insufficiency.  Left ventricular ejection fraction was 60 to 65%.  Cardiac catheterization showed nonobstructive coronary disease.  He was seen by Dr. Burt [name] for consideration of TAVR.  Jordan [name] is here today by himself.  He lives alone.  He has an aunt and 2 cousins who live close by and are his closest relatives.  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    Past Medical History:  Diagnosis Date  . Anxiety   . Aortic valve disease    Severe aortic insufficiency, moderate to severe aortic stenosis  . Arthritis   . CAD (coronary artery disease)    cath 9/21: LAD calcified, pLCx 71, mRCA 30, dRCA 30  . Chronic diastolic CHF   . COPD (chronic obstructive pulmonary disease) ([place])   . Depression   . Diabetes mellitus without complication ([place])   . GERD (gastroesophageal reflux disease)   . Hyperlipidemia   . Hypertension   . Migraine   . Obesity   . Sleep apnea    CPAP   . Venous insufficiency    managed by ortho (Dr. Sharol [name])    Past Surgical History:  Procedure Laterality Date  . ANKLE SURGERY Right    Dr. Marcelino [name], has pins  . KNEE ARTHROSCOPY     Dr. Noemi [name]  . right foot surgery,rt great toe callus removal    . RIGHT/LEFT HEART CATH AND CORONARY ANGIOGRAPHY N/A 03/06/2020   Procedure: RIGHT/LEFT HEART CATH AND CORONARY ANGIOGRAPHY;  Surgeon: Jordan Mocha, MD;  Location: [facility];  Service: Cardiovascular;  Laterality: N/A;  . TEE WITHOUT CARDIOVERSION N/A 03/09/2020   Procedure: TRANSESOPHAGEAL ECHOCARDIOGRAM (TEE);  Surgeon: Jordan Spark, MD;  Location: [facility] ENDOSCOPY;  Service: Cardiovascular;  Laterality: N/A;  . TONSILLECTOMY AND ADENOIDECTOMY     age 69    Family History  Problem Relation Age of Onset  .  Hypertension Mother   . Alzheimer's disease Mother   . Stroke Father        x 3  . Heart attack Father   . Congestive Heart Failure Father   . Diabetes Other        mat great aunts  . Colon cancer Neg Hx   . Rectal cancer Neg Hx   . Esophageal cancer Neg Hx     Social History   Socioeconomic History  . Marital status: Single    Spouse name: Not on file  . Number of children: 0  . Years of education: Not on file  . Highest education level: Not on file  Occupational History  . Occupation: retired  Youth worker: RETIRED  Tobacco Use  . Smoking status: Current Some Day Smoker    Packs/day: 2.00    Years: 20.00    Pack years: 40.00    Types: Cigarettes  . Smokeless tobacco: Never Used  Vaping Use  . Vaping Use: Never used  Substance and Sexual Activity  . Alcohol use: No    Alcohol/week: 0.0 standard drinks  . Drug use: No  . Sexual activity: Yes    Birth control/protection: Condom  Other Topics Concern  . Not on file  Social History Narrative  . Not on file   Social Determinants of Health   Financial Resource Strain:   . Difficulty of Paying Living Expenses: Not on file  Food Insecurity:   . Worried About Charity fundraiser in the Last Year: Not on file  . Ran Out of Food in the Last Year: Not on file  Transportation Needs:   . Lack of Transportation (Medical): Not on file  . Lack of Transportation (Non-Medical): Not on file  Physical Activity:   . Days of Exercise per Week: Not on file  . Minutes of Exercise per Session: Not on file  Stress:   . Feeling of Stress : Not on file  Social Connections:   . Frequency of Communication with Friends and Family: Not on file  . Frequency of Social Gatherings with Friends and Family: Not on file  . Attends Religious Services: Not on file  . Active Member of Clubs or Organizations: Not on file  . Attends Archivist Meetings: Not on file  . Marital Status: Not on file  Intimate Partner Violence:   . Fear of Current or Ex-Partner: Not on file  . Emotionally Abused: Not on file  . Physically Abused: Not on file  . Sexually Abused: Not on file    Current Outpatient Medications  Medication Sig Dispense Refill  . aspirin 81 MG tablet Take 81 mg by mouth daily.    Marland Kitchen atenolol (TENORMIN) 25 MG tablet Take 1 tablet (25 mg total) by mouth daily. 90 tablet 3  . atorvastatin (LIPITOR) 80 MG tablet Take 80 mg by mouth daily after supper.     . budesonide-formoterol (SYMBICORT) 160-4.5 MCG/ACT inhaler  Inhale 2 puffs into the lungs in the morning and at bedtime. 1 each 0  . butalbital-acetaminophen-caffeine (FIORICET, ESGIC) 50-325-40 MG per tablet Take 1 tablet by mouth 2 (two) times daily as needed for [name] or headache.    . doxepin (SINEQUAN) 50 MG capsule Take 50 mg by mouth in the morning and at bedtime.    . folic acid (FOLVITE) 1 MG tablet Take 1 tablet (1 mg total) by mouth daily. 30 tablet 0  . furosemide (LASIX) 40 MG tablet Take 1 tablet (  40 mg total) by mouth daily. 30 tablet 0  . hydrALAZINE (APRESOLINE) 50 MG tablet Take 1 tablet (50 mg total) by mouth every 8 (eight) hours. 270 tablet 3  . ipratropium-albuterol (DUONEB) 0.5-2.5 (3) MG/3ML SOLN Take 3 mLs by nebulization every 6 (six) hours as needed (shortness of breath/wheezing). 360 mL 0  . LORazepam (ATIVAN) 1 MG tablet Take 0.5 tablets (0.5 mg total) by mouth 2 (two) times daily as needed for anxiety.    . montelukast (SINGULAIR) 10 MG tablet Take 10 mg by mouth daily.    . Multiple Vitamin (MULTIVITAMIN WITH MINERALS) TABS Take 1 tablet by mouth daily.    Marland Kitchen omeprazole (PRILOSEC) 20 MG capsule Take 20 mg by mouth daily.    . tamsulosin (FLOMAX) 0.4 MG CAPS capsule Take 0.4 mg by mouth daily.    Marland Kitchen venlafaxine XR (EFFEXOR-XR) 75 MG 24 hr capsule Take 150 mg by mouth daily after supper. BRAND NAME ONLY     No current facility-administered medications for this visit.    Allergies  Allergen Reactions  . Altace [Ramipril]     Dizziness, migraine, weakness  . Codeine Itching  . Naproxen Itching      Review of Systems:   General:  normal appetite, + decreased energy, no weight gain, + weight loss, no fever  Cardiac:  no chest [name] with exertion, no chest [name] at rest, +SOB with exertion, no resting SOB, no PND, no orthopnea, no palpitations, + arrhythmia, + atrial fibrillation, + LE edema, no dizzy spells, no syncope  Respiratory:  + exertional shortness of breath, no home oxygen, no productive cough, no dry cough, no  bronchitis, no wheezing, no hemoptysis, no asthma, no [name] with inspiration or cough, + sleep apnea, + CPAP at night  GI:   no difficulty swallowing, no reflux, no frequent heartburn, no hiatal hernia, no abdominal [name], no constipation, no diarrhea, no hematochezia, no hematemesis, no melena  GU:   no dysuria,  + frequency, no urinary tract infection, no hematuria, no enlarged prostate, no kidney stones, no kidney disease  Vascular:  no [name] suggestive of claudication, no [name] in feet, no leg cramps, + varicose veins, no DVT, + hx of non-healing foot ulcer  Neuro:   no stroke, no TIA's, no seizures, + headaches, no temporary blindness one eye,  no slurred speech, + peripheral neuropathy, no chronic [name], + instability of gait, no memory/cognitive dysfunction  Musculoskeletal: + arthritis, + joint swelling, no myalgias, + difficulty walking and uses walker or cane for stability, + decreased mobility   Skin:   no rash, no itching, no skin infections, + hx of ulcerations in lower legs from venous insufficiency followed by Dr. Sharol [name] and Dr. Forde Dandy.  Psych:   + anxiety, no depression, no nervousness, no unusual recent stress  Eyes:   no blurry vision, no floaters, no recent vision changes, + wears glasses or contacts  ENT:   no hearing loss, + loose or painful teeth, no dentures, last saw dentist recently and scheduled to see Dr. Hoyt Koch on 04/16/20  Hematologic:  no easy bruising, no abnormal bleeding, no clotting disorder, no frequent epistaxis  Endocrine:  + diabetes, does  check CBG's at home      Physical Exam:   BP (!) 150/52   Pulse 64   Temp 97.6 F (36.4 C) (Skin)   Resp 20   Ht 6\' 1"  (1.854 m)   Wt 186 lb (84.4 kg)   SpO2 94% Comment: RA  BMI  24.54 kg/m   General:  well-appearing  HEENT:  Unremarkable, NCAT, PERLA, EOMI  Neck:   no JVD, no bruits, no adenopathy   Chest:   Decreased breath sounds at bases  CV:   RRR, grade lll/VI crescendo/decrescendo murmur heard best at RSB, ll/VI  diastolic murmur LLSB.  Abdomen:  soft, non-tender, no masses   Extremities:  Both lower legs have venous compression stockings on, moderate LE edema bilat  Rectal/GU  Deferred  Neuro:   Grossly non-focal and symmetrical throughout  Skin:   Clean and dry, no rashes, no breakdown   Diagnostic Tests:  TRANSESOPHOGEAL ECHO REPORT       Patient Name:  DAIVON RAYOS Date of Exam: 03/09/2020  Medical Rec #: [id number]     Height:    73.0 in  Accession #:  [id number]    Weight:    187.7 lb  Date of Birth: 1950/11/22     BSA:     2.094 m  Patient Age:  89 years     BP:      157/36 mmHg  Patient Gender: M         HR:      53 bpm.  Exam Location: Inpatient   Procedure: Transesophageal Echo, Color Doppler, Cardiac Doppler and 3D  Echo   Indications:   Aortic valve disorder 424.1 / I35.9    History:     Patient has prior history of Echocardiogram examinations,  most          recent 03/04/2020. CHF, COPD and TIA; Risk  Factors:Hypertension,          Diabetes, Dyslipidemia, Current Smoker and Sleep Apnea.  GERD.    Sonographer:   Vickie Epley RDCS  Referring Phys: [facility]  Diagnosing Phys: [name] MD   PROCEDURE: The transesophogeal probe was passed without difficulty through  the esophogus of the patient. Sedation performed by different physician.  The patient was monitored while under deep sedation. Anesthestetic  sedation was provided intravenously by  Anesthesiology: 250.2mg  of Propofol. Patients was under conscious sedation  during this procedure. Anesthetic administered: 52mcg of Fentanyl. The  patient's vital signs; including heart rate, blood pressure, and oxygen  saturation; remained stable  throughout the procedure. The patient developed no complications during  the procedure.   IMPRESSIONS    1. Moderately calcified and thickened aortic valve with moderately   restricted leaflet openings, however transaortic gradients in the severe  range (mean gradient 43 mmHg) secondary to high flow sec to severe aortic  regurgitation.  2. Left ventricular ejection fraction, by estimation, is 60 to 65%. The  left ventricle has normal function. The left ventricle has no regional  wall motion abnormalities.  3. Right ventricular systolic function is normal. The right ventricular  size is normal.  4. Left atrial size was mildly dilated. No left atrial/left atrial  appendage thrombus was detected.  5. The mitral valve is normal in structure. Trivial mitral valve  regurgitation. No evidence of mitral stenosis.  6. The aortic valve is normal in structure. Aortic valve regurgitation is  severe. Moderate to severe aortic valve stenosis. Aortic valve mean  gradient measures 43.0 mmHg.  7. The inferior vena cava is normal in size with greater than 50%  respiratory variability, suggesting right atrial pressure of 3 mmHg.   Conclusion(s)/Recommendation(s): Normal biventricular function without  evidence of hemodynamically significant valvular heart disease.   FINDINGS  Left Ventricle: Left ventricular ejection fraction, by estimation, is 60  to 65%. The left ventricle has normal function. The left ventricle has no  regional wall motion abnormalities. The left ventricular internal cavity  size was normal in size. There is  no left ventricular hypertrophy.   Right Ventricle: The right ventricular size is normal. No increase in  right ventricular wall thickness. Right ventricular systolic function is  normal.   Left Atrium: Left atrial size was mildly dilated. No left atrial/left  atrial appendage thrombus was detected.   Right Atrium: Right atrial size was normal in size.   Pericardium: There is no evidence of pericardial effusion.   Mitral Valve: The mitral valve is normal in structure. Trivial mitral  valve regurgitation. No evidence of mitral valve  stenosis.   Tricuspid Valve: The tricuspid valve is normal in structure. Tricuspid  valve regurgitation is mild . No evidence of tricuspid stenosis.   Aortic Valve: The aortic valve is normal in structure. Aortic valve  regurgitation is severe. Aortic regurgitation PHT measures 424 msec.  Moderate to severe aortic stenosis is present. Aortic valve mean gradient  measures 43.0 mmHg. Aortic valve peak  gradient measures 73.8 mmHg.   Pulmonic Valve: The pulmonic valve was normal in structure. Pulmonic valve  regurgitation is not visualized. No evidence of pulmonic stenosis.   Aorta: The aortic root is normal in size and structure.   Venous: The inferior vena cava is normal in size with greater than 50%  respiratory variability, suggesting right atrial pressure of 3 mmHg.   IAS/Shunts: No atrial level shunt detected by color flow Doppler.     AORTIC VALVE  AV Vmax:      429.50 cm/s  AV Vmean:     301.500 cm/s  AV VTI:      0.874 m  AV Peak Grad:   73.8 mmHg  AV Mean Grad:   43.0 mmHg  LVOT Vmax:     88.90 cm/s  LVOT Vmean:    59.250 cm/s  LVOT VTI:     0.165 m  LVOT/AV VTI ratio: 0.19  AI PHT:      424 msec     SHUNTS  Systemic VTI: 0.16 m   [name] MD  Electronically signed by [name] MD  Signature Date/Time: 03/09/2020/3:18:40 PM      Final (Updated)   Physicians  Panel Physicians Referring Physician Case Authorizing Physician  Jordan Mocha, MD (Primary)    Procedures  RIGHT/LEFT HEART CATH AND CORONARY ANGIOGRAPHY  Conclusion  1.  Nonobstructive coronary artery disease with mild nonobstructive plaquing in the RCA, wide patency of the left main, wide patency of the LAD, and mild to moderate mid circumflex stenosis 2.  Moderate aortic stenosis with mean gradient 26 mmHg and calculated aortic valve area 1.6 cm. 3.  Moderate pulmonary hypertension with mean PA pressure 34 mmHg, transpulmonary  gradient 13 mmHg, PVR less than 2 Wood units   Surgeon Notes    03/09/2020 2:55 PM CV Procedure signed by Jordan Spark, MD  Indications  Nonrheumatic aortic valve stenosis [I35.0 (ICD-10-CM)]  Procedural Details  Technical Details INDICATION: CHF, aortic stenosis. Referred for R/L heart catheterization  PROCEDURAL DETAILS: There was an indwelling IV in a right antecubital vein. Using normal sterile technique, the IV was changed out for a 5 Fr brachial sheath over a 0.018 inch wire. The right wrist was then prepped, draped, and anesthetized with 1% lidocaine. Using the modified Seldinger technique a 5/6 French Slender sheath was placed in the right radial artery. Intra-arterial verapamil was administered  through the radial artery sheath. IV heparin was administered after a JR4 catheter was advanced into the central aorta. A Swan-Ganz catheter was used for the right heart catheterization. Standard protocol was followed for recording of right heart pressures and sampling of oxygen saturations. Fick cardiac output was calculated. Standard Judkins catheters were used for selective coronary angiography.  The aortic valve was crossed with a J-wire.  LV pressure is recorded and an aortic valve pullback is performed. There were no immediate procedural complications. The patient was transferred to the post catheterization recovery area for further monitoring.    Estimated blood loss <50 mL.   During this procedure medications were administered to achieve and maintain moderate conscious sedation while the patient's heart rate, blood pressure, and oxygen saturation were continuously monitored and I was present face-to-face 100% of this time.  Medications (Filter: Administrations occurring from 1128 to 1253 on 03/06/20) (important) Continuous medications are totaled by the amount administered until 03/06/20 1253.  midazolam (VERSED) injection (mg) Total dose:  1 mg Date/Time  Rate/Dose/Volume  Action  03/06/20 1203  1 mg [name]    fentaNYL (SUBLIMAZE) injection (mcg) Total dose:  25 mcg Date/Time  Rate/Dose/Volume Action  03/06/20 1203  25 mcg [name]    Radial Cocktail/Verapamil only (mL) Total volume:  10 mL Date/Time  Rate/Dose/Volume Action  03/06/20 1218  10 mL [name]    heparin sodium (porcine) injection (Units) Total dose:  5,000 Units Date/Time  Rate/Dose/Volume Action  03/06/20 1221  5,000 Units [name]    iohexol (OMNIPAQUE) 350 MG/ML injection (mL) Total volume:  30 mL Date/Time  Rate/Dose/Volume Action  03/06/20 1233  30 mL [name]    Heparin (Porcine) in NaCl 1000-0.9 UT/500ML-% SOLN (mL) Total volume:  1,000 mL Date/Time  Rate/Dose/Volume Action  03/06/20 1240  500 mL [name]  1240  500 mL [name]    lidocaine (PF) (XYLOCAINE) 1 % injection (mL) Total volume:  5 mL Date/Time  Rate/Dose/Volume Action  03/06/20 1216  5 mL [name]    nicotine (NICODERM CQ - dosed in mg/24 hours) patch 21 mg (mg) Total dose:  Cannot be calculated* Dosing weight:  102.1 *Administration dose not documented Date/Time  Rate/Dose/Volume Action  03/06/20 1128  *Not included in total MAR Hold    acetaminophen (TYLENOL) tablet 650 mg (mg) Total dose:  Cannot be calculated* Dosing weight:  102.1 *Administration dose not documented Date/Time  Rate/Dose/Volume Action  03/06/20 1128  *Not included in total MAR Hold    aspirin EC tablet 81 mg (mg) Total dose:  Cannot be calculated* Dosing weight:  102.1 *Administration dose not documented Date/Time  Rate/Dose/Volume Action  03/06/20 1128  *Not included in total MAR Hold    atorvastatin (LIPITOR) tablet 80 mg (mg) Total dose:  Cannot be calculated* *Administration dose not documented Date/Time  Rate/Dose/Volume Action  03/06/20 1128  *Not included in total MAR Hold    butalbital-acetaminophen-caffeine (FIORICET) 50-325-40 MG per tablet 1 tablet (tablet) Total dose:  Cannot be calculated* *Administration dose not  documented Date/Time  Rate/Dose/Volume Action  03/06/20 1128  *Not included in total MAR Hold    enoxaparin (LOVENOX) injection 40 mg (mg) Total dose:  Cannot be calculated* Dosing weight:  102.1 *Administration dose not documented Date/Time  Rate/Dose/Volume Action  03/06/20 1128  *Not included in total MAR Hold    feeding supplement (ENSURE ENLIVE) (ENSURE ENLIVE) liquid 237 mL (mL) Total dose:  Cannot be calculated* Dosing weight:  94.6 *Administration dose not documented Date/Time  Rate/Dose/Volume Action  03/06/20 1128  *  Not included in total MAR Hold    folic acid (FOLVITE) tablet 1 mg (mg) Total dose:  Cannot be calculated* Dosing weight:  102.1 *Administration dose not documented Date/Time  Rate/Dose/Volume Action  03/06/20 1128  *Not included in total MAR Hold    hydrocerin (EUCERIN) cream (application) Total dose:  Cannot be calculated* Dosing weight:  102.1 *Administration dose not documented Date/Time  Rate/Dose/Volume Action  03/06/20 1128  *Not included in total MAR Hold    LORazepam (ATIVAN) tablet 0.5 mg (mg) Total dose:  Cannot be calculated* Dosing weight:  94.6 *Administration dose not documented Date/Time  Rate/Dose/Volume Action  03/06/20 1128  *Not included in total MAR Hold    multivitamin with minerals tablet 1 tablet (tablet) Total dose:  Cannot be calculated* Dosing weight:  94.6 *Administration dose not documented Date/Time  Rate/Dose/Volume Action  03/06/20 1128  *Not included in total MAR Hold    ondansetron (ZOFRAN) injection 4 mg (mg) Total dose:  Cannot be calculated* Dosing weight:  102.1 *Administration dose not documented Date/Time  Rate/Dose/Volume Action  03/06/20 1128  *Not included in total MAR Hold    pantoprazole (PROTONIX) EC tablet 40 mg (mg) Total dose:  Cannot be calculated* Dosing weight:  102.1 *Administration dose not documented Date/Time  Rate/Dose/Volume Action  03/06/20 1128  *Not included in total MAR Hold     sodium chloride flush (NS) 0.9 % injection 3 mL (mL) Total dose:  Cannot be calculated* Dosing weight:  94.6 *Administration dose not documented Date/Time  Rate/Dose/Volume Action  03/06/20 1128  *Not included in total MAR Hold    venlafaxine XR (EFFEXOR-XR) 24 hr capsule 150 mg (mg) Total dose:  Cannot be calculated* *Administration dose not documented Date/Time  Rate/Dose/Volume Action  03/06/20 1128  *Not included in total MAR Hold    Sedation Time  Sedation Time Physician-1: 30 minutes 30 seconds  Contrast  Medication Name Total Dose  iohexol (OMNIPAQUE) 350 MG/ML injection 30 mL    Radiation/Fluoro  Fluoro time: 4 (min) DAP: 18.8 (Gycm2) Cumulative Air Kerma: 40.3 (mGy)  Coronary Findings  Diagnostic Dominance: Right Left Anterior Descending  The vessel is moderately calcified.  Left Circumflex  Prox Cx to Mid Cx lesion is 50% stenosed. The lesion is calcified.  Right Coronary Artery  The vessel is mildly calcified.  Mid RCA lesion is 30% stenosed.  Dist RCA lesion is 30% stenosed.  Intervention  No interventions have been documented. Left Heart  Aortic Valve There is moderate aortic valve stenosis. The aortic valve is calcified. There is restricted aortic valve motion. The aortic valve mean gradient is 26 mmHg, peak instantaneous gradient 42 mmHg, calculated aortic valve area 1.6 cm  Coronary Diagrams  Diagnostic Dominance: Right  Intervention  Implants   No implant documentation for this case.  Syngo Images  Show images for CARDIAC CATHETERIZATION Images on [employer] Term Storage  Show images for [place], [place] to Procedure Log  Procedure Log    Hemo Data   Most Recent Value  Fick Cardiac Output 7.71 L/min  Fick Cardiac Output Index 3.51 (L/min)/BSA  RA A Wave 14 mmHg  RA V Wave 11 mmHg  RA Mean 9 mmHg  RV Systolic Pressure 48 mmHg  RV Diastolic Pressure 5 mmHg  RV EDP 11 mmHg  PA Systolic Pressure 50 mmHg  PA Diastolic  Pressure 22 mmHg  PA Mean 34 mmHg  PW A Wave 20 mmHg  PW V Wave 27 mmHg  PW Mean 21 mmHg  AO Systolic  Pressure 527 mmHg  AO Diastolic Pressure 42 mmHg  AO Mean 73 mmHg  LV Systolic Pressure 782 mmHg  LV Diastolic Pressure 10 mmHg  LV EDP 17 mmHg  AOp Systolic Pressure 423 mmHg  AOp Diastolic Pressure 45 mmHg  AOp Mean Pressure 77 mmHg  LVp Systolic Pressure 536 mmHg  LVp Diastolic Pressure 12 mmHg  LVp EDP Pressure 20 mmHg  QP/QS 1  TPVR Index 9.68 HRUI  TSVR Index 20.77 HRUI  PVR SVR Ratio 0.2  TPVR/TSVR Ratio 0.47   ADDENDUM REPORT: 03/09/2020 16:59  CLINICAL DATA:  69 year old male with severe aortic stenosis being evaluated for a TAVR procedure.  EXAM: Cardiac TAVR CT  TECHNIQUE: The patient was scanned on a [employer]. A 120 kV retrospective scan was triggered in the descending thoracic aorta at 111 HU's. Gantry rotation speed was 250 msecs and collimation was .6 mm. No beta blockade or nitro were [name]. The 3D data set was reconstructed in 5% intervals of the R-R cycle. Systolic and diastolic phases were analyzed on a dedicated work station using MPR, MIP and VRT modes. The patient received 80 cc of contrast.  FINDINGS: Aortic Valve: Trileaflet with severely calcified leaflets with moderately restricted leaflet opening and no calcifications extending into the LVOT.  Aorta: Normal size with mild diffuse atherosclerotic plaque and calcifications and no dissection.  Sinotubular Junction: 26 x 25 mm  Ascending Thoracic Aorta: 33 x 32 mm  Aortic Arch: 26 x 25 mm  Descending Thoracic Aorta: 28 x 28 mm  Sinus of Valsalva Measurements:  Non-coronary: 31 mm  Right -coronary: 31 mm  Left -coronary: 34 mm  Coronary Artery Height above Annulus:  Left Main: 15 mm  Right Coronary: 16 mm  Virtual Basal Annulus Measurements:  Maximum/Minimum Diameter: 29.3 x 22.9 mm  Mean Diameter: 26.0 mm  Perimeter: 84 mm  Area:  532 mm2  Optimum Fluoroscopic Angle for Delivery: RAO 3 CRA 4.  IMPRESSION: 1. Trileaflet aortic valve with severely calcified leaflets with moderately restricted leaflet opening and no calcifications extending into the LVOT. Aortic valve calcium score 2271 consistent with severe aortic stenosis. Annular measurements suitable for delivery of 26 mm Edwards-SAPIEN 3 Ultra valve.  2. Sufficient coronary to annulus distance.  3. Optimum Fluoroscopic Angle for Delivery: RAO 3 CRA 4.  4. No thrombus in the left atrial appendage.   Electronically Signed   By: [name]   On: 03/09/2020 16:59   Addended by Jordan Spark, MD on 03/09/2020 5:01 PM  Study Result  Addenda  ADDENDUM REPORT: 03/09/2020 16:59  CLINICAL DATA:  69 year old male with severe aortic stenosis being evaluated for a TAVR procedure.  EXAM: Cardiac TAVR CT  TECHNIQUE: The patient was scanned on a [employer]. A 120 kV retrospective scan was triggered in the descending thoracic aorta at 111 HU's. Gantry rotation speed was 250 msecs and collimation was .6 mm. No beta blockade or nitro were [name]. The 3D data set was reconstructed in 5% intervals of the R-R cycle. Systolic and diastolic phases were analyzed on a dedicated work station using MPR, MIP and VRT modes. The patient received 80 cc of contrast.  FINDINGS: Aortic Valve: Trileaflet with severely calcified leaflets with moderately restricted leaflet opening and no calcifications extending into the LVOT.  Aorta: Normal size with mild diffuse atherosclerotic plaque and calcifications and no dissection.  Sinotubular Junction: 26 x 25 mm  Ascending Thoracic Aorta: 33 x 32 mm  Aortic Arch: 26 x 25 mm  Descending Thoracic Aorta: 28 x 28 mm  Sinus of Valsalva Measurements:  Non-coronary: 31 mm  Right -coronary: 31 mm  Left -coronary: 34 mm  Coronary Artery Height above Annulus:  Left Main: 15  mm  Right Coronary: 16 mm  Virtual Basal Annulus Measurements:  Maximum/Minimum Diameter: 29.3 x 22.9 mm  Mean Diameter: 26.0 mm  Perimeter: 84 mm  Area: 532 mm2  Optimum Fluoroscopic Angle for Delivery: RAO 3 CRA 4.  IMPRESSION: 1. Trileaflet aortic valve with severely calcified leaflets with moderately restricted leaflet opening and no calcifications extending into the LVOT. Aortic valve calcium score 2271 consistent with severe aortic stenosis. Annular measurements suitable for delivery of 26 mm Edwards-SAPIEN 3 Ultra valve.  2. Sufficient coronary to annulus distance.  3. Optimum Fluoroscopic Angle for Delivery: RAO 3 CRA 4.  4. No thrombus in the left atrial appendage.   Electronically Signed   By: [name]   On: 03/09/2020 16:59   Signed by Jordan Spark, MD on 03/09/2020 5:01 PM  Narrative & Impression  EXAM: OVER-READ INTERPRETATION  CT CHEST  The following report is an over-read performed by radiologist Dr. Salvatore Marvel of [facility], [place] on 03/07/2020. This over-read does not include interpretation of cardiac or coronary anatomy or pathology. The coronary CTA interpretation by the cardiologist is attached.  COMPARISON:  None.  FINDINGS: Please see the separate concurrent chest CT angiogram report for details.  IMPRESSION: Please see the separate concurrent chest CT angiogram report for details.  Electronically Signed: By: Ilona Sorrel M.D. On: 03/07/2020 12:38    CLINICAL DATA:  Inpatient. Severe aortic stenosis. Acute on chronic heart failure. TAVR evaluation.  EXAM: CT ANGIOGRAPHY CHEST, ABDOMEN AND PELVIS  TECHNIQUE: Multidetector CT imaging through the chest, abdomen and pelvis was performed using the standard protocol during bolus administration of intravenous contrast. Multiplanar reconstructed images and MIPs were obtained and reviewed to evaluate the vascular anatomy.  CONTRAST:  162mL  OMNIPAQUE IOHEXOL 350 MG/ML SOLN  COMPARISON:  03/04/2020 chest radiograph.  FINDINGS: CTA CHEST FINDINGS  Cardiovascular: Mild cardiomegaly. No significant pericardial effusion/thickening. Three-vessel coronary atherosclerosis. Atherosclerotic nonaneurysmal thoracic aorta, including ascending thoracic aortic atherosclerotic calcification. Diffuse thickening and coarse calcification of the aortic valve. Normal caliber pulmonary arteries. No central pulmonary emboli.  Mediastinum/Nodes: No discrete thyroid nodules. Unremarkable esophagus. No axillary adenopathy. Mild right paratracheal adenopathy up to 1.4 cm (series 6/image 70). Mildly enlarged 1.1 cm subcarinal node (series 6/image 70). Mild AP window adenopathy up to 1.3 cm (series 6/image 59). Mild bilateral hilar adenopathy up to 1.4 cm on the right (series 6/image 66) and 1.2 cm on the left (series 6/image 65).  Lungs/Pleura: No pneumothorax. Moderate dependent bilateral pleural effusions. Moderate dependent bilateral lower lobe passive atelectasis. Mild diffuse interlobular septal thickening. No acute consolidative airspace disease, lung masses or significant pulmonary nodules.  Musculoskeletal: No aggressive appearing focal osseous lesions. Incompletely healed posterior right ninth through twelfth rib fractures. Moderate thoracic spondylosis.  CTA ABDOMEN AND PELVIS FINDINGS  Hepatobiliary: Normal liver with no liver mass. Normal gallbladder with no radiopaque cholelithiasis. No biliary ductal dilatation.  Pancreas: Normal, with no mass or duct dilation.  Spleen: Normal size. No mass.  Adrenals/Urinary Tract: Normal adrenals. No hydronephrosis. Simple 2.5 cm anterior upper left renal cyst. Additional subcentimeter hypodense renal cortical lesions scattered in both kidneys are too small to characterize and require no follow-up. Excreted contrast in the bladder. Mild diffuse bladder wall thickening and  trabeculation with tiny bladder diverticula.  Stomach/Bowel: Normal  non-distended stomach. Normal caliber small bowel with no small bowel wall thickening. Normal appendix. Moderate colorectal stool. No large bowel wall thickening, diverticulosis or significant pericolonic fat stranding.  Vascular/Lymphatic: Atherosclerotic nonaneurysmal abdominal aorta. Mild porta hepatis adenopathy up to 1.3 cm (series 6/image 119). No pelvic adenopathy.  Reproductive: Top-normal size prostate.  Other: No pneumoperitoneum, ascites or focal fluid collection.  Musculoskeletal: No aggressive appearing focal osseous lesions. Marked lumbar spondylosis.  VASCULAR MEASUREMENTS PERTINENT TO TAVR:  AORTA:  Minimal Aortic Diameter-17.3 x 17.2 mm  Severity of Aortic Calcification-moderate  RIGHT PELVIS:  Right Common Iliac Artery -  Minimal Diameter-8.1 x 6.7 mm  Tortuosity-moderate  Calcification-severe  Right External Iliac Artery -  Minimal Diameter-8.4 x 7.8 mm  Tortuosity-moderate  Calcification-mild-to-moderate  Right Common Femoral Artery -  Minimal Diameter-9.5 x 8.0 mm  Tortuosity-mild  Calcification-severe  LEFT PELVIS:  Left Common Iliac Artery -  Minimal Diameter-8.3 x 7.2 mm  Tortuosity-moderate  Calcification-severe  Left External Iliac Artery -  Minimal Diameter-7.6 x 6.8 mm  Tortuosity-mild-to-moderate  Calcification-moderate  Left Common Femoral Artery -  Minimal Diameter-9.9 x 8.0 mm  Tortuosity-mild  Calcification-moderate  Review of the MIP images confirms the above findings.  IMPRESSION: 1. Vascular findings and measurements pertinent to potential TAVR procedure, as detailed. 2. Diffuse thickening and coarse calcification of the aortic valve, compatible with the reported history of severe aortic stenosis. 3. Mild cardiomegaly. Three-vessel coronary atherosclerosis. 4. Moderate dependent bilateral  pleural effusions. 5. Mild diffuse interlobular septal thickening, compatible with mild pulmonary edema. 6. Mild porta hepatis, mild mediastinal and mild bilateral hilar lymphadenopathy, nonspecific, potentially reactive. Suggest follow-up CT chest, abdomen and pelvis with IV contrast in 3 months. 7. Mild diffuse bladder wall thickening and trabeculation with tiny bladder diverticula, suggesting nonspecific chronic bladder voiding dysfunction. 8. Moderate colorectal stool, suggesting constipation. 9. Incompletely healed posterior right ninth through twelfth rib fractures. 10. Aortic Atherosclerosis (ICD10-I70.0).   Electronically Signed   By: Ilona Sorrel M.D.   On: 03/07/2020 13:03   STS RISK CALCULATOR: Isolated AVR: Risk of Mortality:  1.907%  Renal Failure:  3.671%  Permanent Stroke:  0.752%  Prolonged Ventilation:  6.980%  DSW Infection:  0.098%  Reoperation:  3.732%  Morbidity or Mortality:  12.419%  Short Length of Stay:  40.415%  Long Length of Stay:  5.066%    Impression:  This 69 year old gentleman has stage D, severe, symptomatic aortic stenosis and insufficiency with New York Heart Association class II symptoms of exertional fatigue and shortness of breath consistent with chronic diastolic congestive heart failure.  He was admitted last month with acute diastolic congestive heart failure with marked volume overload but improved with diuresis.  I have personally reviewed his 2D echocardiogram, TEE, cardiac catheterization, and CTA studies.  His 2D echocardiogram had poor acoustic windows and it was not possible to delineate the morphology of his valve.  The mean gradient was measured at 37 mmHg with a dimensionless index of 0.22 and an aortic valve area of 0.7 cm.  He subsequently underwent a TEE which showed a mean gradient across aortic valve of 43 mmHg consistent with severe aortic stenosis.  There was severe aortic insufficiency.  Left ventricular ejection  fraction was 60 to 65%.  Cardiac catheterization showed nonobstructive coronary disease with a mean gradient across aortic valve of 26 mmHg.  I agree that aortic valve replacement is indicated in this patient for relief of his symptoms and to prevent recurrent acute decompensation and left ventricular deterioration.  He has  significant calcification of the aortic root and ascending aorta and multiple comorbid risk factors including diabetes, OSA, active heavy smoking and COPD, and chronic venous insufficiency with chronic lower extremity swelling and reduced mobility which would increase his surgical risk.  I think transcatheter aortic valve replacement would be a reasonable alternative for this patient.  His gated cardiac CTA shows anatomy suitable for transcatheter aortic valve replacement using a SAPIEN 3 valve.  His abdominal and pelvic CTA shows adequate pelvic vascular anatomy to allow transfemoral insertion.  The patient was counseled at length regarding treatment alternatives for management of severe symptomatic aortic stenosis. The risks and benefits of surgical intervention has been discussed in detail. Long-term prognosis with medical therapy was discussed. Alternative approaches such as conventional surgical aortic valve replacement, transcatheter aortic valve replacement, and palliative medical therapy were compared and contrasted at length. This discussion was placed in the context of the patient's own specific clinical presentation and past medical history. All of his questions have been addressed.   Following the decision to proceed with transcatheter aortic valve replacement, a discussion was held regarding what types of management strategies would be attempted intraoperatively in the event of life-threatening complications, including whether or not the patient would be considered a candidate for the use of cardiopulmonary bypass and/or conversion to open sternotomy for attempted surgical  intervention. The patient is aware of the fact that transient use of cardiopulmonary bypass may be necessary.  I think he would be a candidate for emergent sternotomy if needed to manage any intraoperative complications.  The patient has been advised of a variety of complications that might develop including but not limited to risks of death, stroke, paravalvular leak, aortic dissection or other major vascular complications, aortic annulus rupture, device embolization, cardiac rupture or perforation, mitral regurgitation, acute myocardial infarction, arrhythmia, heart block or bradycardia requiring permanent pacemaker placement, congestive heart failure, respiratory failure, renal failure, pneumonia, infection, other late complications related to structural valve deterioration or migration, or other complications that might ultimately cause a temporary or permanent loss of functional independence or other long term morbidity. The patient provides full informed consent for the procedure as described and all questions were answered.    Plan:  He has a dental visit with Dr. Jodene Nam on 04/16/2020 for evaluation of possible tooth extractions.  He will be scheduled for transfemoral transcatheter aortic valve replacement after recovery from anticipated dental extractions.  I spent 60 minutes performing this consultation and > 50% of this time was spent face to face counseling and coordinating the care of this patient's severe symptomatic aortic stenosis and insufficiency.   Gaye Pollack, MD 04/08/2020 11:04 AM

## 2020-04-14 ENCOUNTER — Encounter: Payer: Self-pay | Admitting: Surgery

## 2020-04-16 ENCOUNTER — Telehealth: Payer: Self-pay | Admitting: *Deleted

## 2020-04-16 NOTE — Telephone Encounter (Signed)
   Primary Cardiologist: Candee Furbish, MD  Chart reviewed as part of pre-operative protocol coverage. Patient was contacted 04/16/2020 in reference to pre-operative risk assessment for pending surgery as outlined below.  Jordan [name] was last seen on 03/18/20 by [name], PA-C. He is currently undergoing work-up for possible TAVR.  Since that day, Jordan [name] has done fine from a cardiac standpoint. He is cautious with activity recently but has been working with physical therapy. Recent cardiac work-up included a heart catheterization which revealed moderate non-obstructive disease and an echo with EF 55-60% and severe aortic stenosis.  Therefore, based on ACC/AHA guidelines, the patient would be at acceptable risk for the planned procedure without further cardiovascular testing.   The patient was advised that if he develops new symptoms prior to surgery to contact our office to arrange for a follow-up visit, and he verbalized understanding.  If needed, patient can hold aspirin 1 week prior to his procedure with plans to restart when cleared to do so by his dentist. SBE prophylaxis is NOT needed at this time.   I will route this recommendation to the requesting party via Epic fax function and remove from pre-op pool. Please call with questions.  Abigail Butts, PA-C 04/16/2020, 3:53 PM

## 2020-04-16 NOTE — Telephone Encounter (Signed)
   [facility] Pre-operative Risk Assessment    HEARTCARE STAFF: - Please ensure there is not already an duplicate clearance open for this procedure. - Under Visit Info/Reason for Call, type in Other and utilize the format Clearance MM/DD/YY or Clearance TBD. Do not use dashes or single digits. - If request is for dental extraction, please clarify the # of teeth to be extracted.  Request for surgical clearance:  1. What type of surgery is being performed? REMOVAL OF REMAINING 16 TEETH WITH ALVEOPLASTY   2. When is this surgery scheduled? TBD   3. What type of clearance is required (medical clearance vs. Pharmacy clearance to hold med vs. Both)? MEDICAL  4. Are there any medications that need to be held prior to surgery and how long? ASA ; SBE?   5. Practice name and name of physician performing surgery? Diona Browner, D.M.D,., P.A.  6. What is the office phone number? [phone]   7.   What is the office fax number? [phone]  8.   Anesthesia type (None, local, MAC, general) ? GENERAL   Julaine Hua 04/16/2020, 3:31 PM  _________________________________________________________________   (provider comments below)

## 2020-04-17 ENCOUNTER — Telehealth: Payer: Self-pay | Admitting: Physician Assistant

## 2020-04-17 NOTE — Telephone Encounter (Signed)
  [facility]   MULTIDISCIPLINARY HEART VALVE TEAM  Pt recently seen by Dr. Hoyt Koch with oral surgery. He will require a full dental extraction. Dr. Hoyt Koch asked for advice on whether extraction could be done in the OR or in the office. We recommended it be done in the OR. Dr. Lupita Leash office is closed on Friday, but I did leave a message on their VM. I also let the pt know and he agrees. He will call us when his extractions are scheduled.   Angelena Form PA-C  [facility]

## 2020-04-20 NOTE — Telephone Encounter (Signed)
This note was faxed to Dr Lupita Leash office at [phone].

## 2020-04-21 ENCOUNTER — Encounter: Payer: Self-pay | Admitting: Physician Assistant

## 2020-04-21 ENCOUNTER — Ambulatory Visit: Payer: Medicare PPO | Admitting: Physician Assistant

## 2020-04-21 VITALS — Ht 73.0 in | Wt 186.0 lb

## 2020-04-21 DIAGNOSIS — I872 Venous insufficiency (chronic) (peripheral): Secondary | ICD-10-CM

## 2020-04-21 NOTE — Progress Notes (Signed)
Office Visit Note   Patient: Jordan [name]           Date of Birth: 1950/12/12           MRN: [id number] Visit Date: 04/21/2020              Requested by: [name], [place],  [place] [address] PCP: [name], MD  Chief Complaint  Patient presents with  . Right Leg - Edema  . Left Leg - Edema      HPI: This is a pleasant 69 year old gentleman who I followed for bilateral venous insufficiency.  He had been doing well and was wearing vive compression stockings.  I was following him on a monthly basis.  Unfortunately he was in the hospital he said he developed a lot more swelling in his legs.  Since then he has been having difficulty with swelling in the left greater than the right.  Assessment & Plan: Visit Diagnoses: No diagnosis found.  Plan: We will begin Profore wraps today.  We will have home health agency wrap on Friday.  He will follow-up in 1 week.  Follow-Up Instructions: No follow-ups on file.   Ortho Exam  Patient is alert, oriented, no adenopathy, well-dressed, normal affect, normal respiratory effort. Left greater than right swelling no ascending cellulitis brawny skin changes no open ulcers.  No sign of acute infection.    Imaging: No results found. No images are attached to the encounter.  Labs: Lab Results  Component Value Date   HGBA1C 5.3 03/05/2020   HGBA1C 5.7 (H) 03/14/2017   HGBA1C 5.0 10/30/2014   ESRSEDRATE 28 (H) 10/30/2014   CRP 6.5 (H) 10/30/2014   REPTSTATUS 03/19/2017 FINAL 03/14/2017   CULT  03/14/2017    NO GROWTH 5 DAYS Performed at [facility], [place] [address] [place], [place] [address]      Lab Results  Component Value Date   ALBUMIN 3.1 (L) 03/03/2020   ALBUMIN 1.9 (L) 03/20/2017   ALBUMIN 1.7 (L) 03/15/2017   PREALBUMIN 9.4 (L) 10/30/2014    Lab Results  Component Value Date   MG 1.8 03/12/2020   MG 1.8 03/05/2020   MG 2.0 03/20/2017   No results found for: [facility]  Lab  Results  Component Value Date   PREALBUMIN 9.4 (L) 10/30/2014   CBC EXTENDED Latest Ref Rng & Units 03/12/2020 03/11/2020 03/09/2020  WBC 4.0 - 10.5 K/uL 8.6 6.8 7.0  RBC 4.22 - 5.81 MIL/uL 4.02(L) 4.25 4.03(L)  HGB 13.0 - 17.0 g/dL 11.8(L) 12.1(L) 11.6(L)  HCT 39 - 52 % 37.0(L) 39.3 37.6(L)  PLT 150 - 400 K/uL 195 170 157  NEUTROABS 1.7 - 7.7 K/uL - - -  LYMPHSABS 0.7 - 4.0 K/uL - - -     Body mass index is 24.54 kg/m.  Orders:  No orders of the defined types were placed in this encounter.  No orders of the defined types were placed in this encounter.    Procedures: No procedures performed  Clinical Data: No additional findings.  ROS:  All other systems negative, except as noted in the HPI. Review of Systems  Objective: Vital Signs: Ht 6\' 1"  (1.854 m)   Wt 186 lb (84.4 kg)   BMI 24.54 kg/m   Specialty Comments:  No specialty comments available.  PMFS History: Patient Active Problem List   Diagnosis Date Noted  . Protein-calorie malnutrition, severe 03/05/2020  . Acute on chronic congestive heart failure ([place]) 03/03/2020  .  Tobacco abuse 03/03/2020  . Pressure injury of skin 03/15/2017  . Cellulitis, leg 03/14/2017  . Cellulitis of both feet   . Cellulitis of right leg 10/30/2014  . Chronic venous insufficiency 04/28/2014  . PAD (peripheral artery disease) ([place]) 01/21/2014  . Varicose veins of lower extremities with other complications 00/86/7619  . Diabetes ([place]) 12/31/2013  . Hx of TIA (transient ischemic attack) and stroke 12/31/2013  . HLD (hyperlipidemia) 12/31/2013  . Severe obesity (BMI >= 40) ([place]) 12/31/2013  . Cellulitis of leg, left 09/13/2012  . 272.4 09/13/2012  . Essential hypertension, benign 09/13/2012  . Edema 09/13/2012   Past Medical History:  Diagnosis Date  . Anxiety   . Aortic valve disease    Severe aortic insufficiency, moderate to severe aortic stenosis  . Arthritis   . CAD (coronary artery disease)    cath 9/21: LAD  calcified, pLCx 65, mRCA 30, dRCA 30  . Chronic diastolic CHF   . COPD (chronic obstructive pulmonary disease) ([place])   . Depression   . Diabetes mellitus without complication ([place])   . GERD (gastroesophageal reflux disease)   . Hyperlipidemia   . Hypertension   . Migraine   . Obesity   . Sleep apnea    CPAP   . Venous insufficiency    managed by ortho (Dr. Sharol Given)    Family History  Problem Relation Age of Onset  . Hypertension Mother   . Alzheimer's disease Mother   . Stroke Father        x 3  . Heart attack Father   . Congestive Heart Failure Father   . Diabetes Other        mat great aunts  . Colon cancer Neg Hx   . Rectal cancer Neg Hx   . Esophageal cancer Neg Hx     Past Surgical History:  Procedure Laterality Date  . ANKLE SURGERY Right    Dr. Marcelino Scot, has pins  . KNEE ARTHROSCOPY     Dr. Noemi Chapel  . right foot surgery,rt great toe callus removal    . RIGHT/LEFT HEART CATH AND CORONARY ANGIOGRAPHY N/A 03/06/2020   Procedure: RIGHT/LEFT HEART CATH AND CORONARY ANGIOGRAPHY;  Surgeon: Sherren Mocha, MD;  Location: [facility];  Service: Cardiovascular;  Laterality: N/A;  . TEE WITHOUT CARDIOVERSION N/A 03/09/2020   Procedure: TRANSESOPHAGEAL ECHOCARDIOGRAM (TEE);  Surgeon: Dorothy Spark, MD;  Location: [facility];  Service: Cardiovascular;  Laterality: N/A;  . TONSILLECTOMY AND ADENOIDECTOMY     age 37   Social History   Occupational History  . Occupation: retired Youth worker: RETIRED  Tobacco Use  . Smoking status: Current Some Day Smoker    Packs/day: 2.00    Years: 20.00    Pack years: 40.00    Types: Cigarettes  . Smokeless tobacco: Never Used  Vaping Use  . Vaping Use: Never used  Substance and Sexual Activity  . Alcohol use: No    Alcohol/week: 0.0 standard drinks  . Drug use: No  . Sexual activity: Yes    Birth control/protection: Condom

## 2020-04-22 ENCOUNTER — Other Ambulatory Visit: Payer: Self-pay

## 2020-04-22 ENCOUNTER — Telehealth: Payer: Self-pay | Admitting: Orthopedic Surgery

## 2020-04-22 ENCOUNTER — Telehealth: Payer: Self-pay

## 2020-04-22 NOTE — Telephone Encounter (Signed)
I called and advised that we use the unna underneath to hold the profore up as it slides down on large swollen legs. If they do not have the product they do not have to use.

## 2020-04-22 NOTE — Telephone Encounter (Signed)
Jordan [name] with [name] called wanting to verify wether they were supposed to use the unna dressings or the Profore wraps or both? Jordan [name] states they basically do the same thing so she would like clarification  CB# [phone]* Just ask for [place]

## 2020-04-22 NOTE — Telephone Encounter (Signed)
I called [name] [phone] sw Eliezer Lofts advised that we saw the pt in the office yesterday and applied bilateral unna profore dressings. HHN to change dressings on fridays and pt will follow up with [place] on Tuesdays. Did advise that they are having some difficulty with supply of unna and if there is any problems they will call the office. Faxed order to [phone] and the back up fax [phone]

## 2020-04-28 ENCOUNTER — Encounter: Payer: Self-pay | Admitting: Physician Assistant

## 2020-04-28 ENCOUNTER — Ambulatory Visit: Payer: Medicare PPO | Admitting: Physician Assistant

## 2020-04-28 VITALS — Ht 73.0 in | Wt 186.0 lb

## 2020-04-28 DIAGNOSIS — I872 Venous insufficiency (chronic) (peripheral): Secondary | ICD-10-CM | POA: Diagnosis not present

## 2020-04-28 NOTE — Progress Notes (Signed)
Office Visit Note   Patient: Jordan [name]           Date of Birth: December 24, 1950           MRN: [id number] Visit Date: 04/28/2020              Requested by: [name], [place],  [place] [address] PCP: [name], MD  Chief Complaint  Patient presents with  . Left Leg - Follow-up  . Right Leg - Follow-up      HPI: Patient presents today for his bilateral lower extremities.  He has a history of chronic venous insufficiency.  He was doing very well with vive compression stockings.  Unfortunately he recently was hospitalized and had significant return of swelling in his lower extremities.  He was placed in [employer] wraps.  He is here for follow-up.  He feels his legs have gotten much better  Assessment & Plan: Visit Diagnoses: No diagnosis found.  Plan: His left calf measured 45.  He has double XL compression stockings with him.  I placed these on his meds today.  He will follow-up in 2 weeks.  Sooner if he has any difficulties  Follow-Up Instructions: No follow-ups on file.   Ortho Exam  Patient is alert, oriented, no adenopathy, well-dressed, normal affect, normal respiratory effort. Focused examination of his bilateral lower extremities he has wrinkling and significant decrease.  He has brawny skin changes and definite flaking of the skin.  No cellulitis  Imaging: No results found. No images are attached to the encounter.  Labs: Lab Results  Component Value Date   HGBA1C 5.3 03/05/2020   HGBA1C 5.7 (H) 03/14/2017   HGBA1C 5.0 10/30/2014   ESRSEDRATE 28 (H) 10/30/2014   CRP 6.5 (H) 10/30/2014   REPTSTATUS 03/19/2017 FINAL 03/14/2017   CULT  03/14/2017    NO GROWTH 5 DAYS Performed at [facility], [place] [address], [place],  [address]      Lab Results  Component Value Date   ALBUMIN 3.1 (L) 03/03/2020   ALBUMIN 1.9 (L) 03/20/2017   ALBUMIN 1.7 (L) 03/15/2017   PREALBUMIN 9.4 (L) 10/30/2014    Lab Results  Component  Value Date   MG 1.8 03/12/2020   MG 1.8 03/05/2020   MG 2.0 03/20/2017   No results found for: [facility]  Lab Results  Component Value Date   PREALBUMIN 9.4 (L) 10/30/2014   CBC EXTENDED Latest Ref Rng & Units 03/12/2020 03/11/2020 03/09/2020  WBC 4.0 - 10.5 K/uL 8.6 6.8 7.0  RBC 4.22 - 5.81 MIL/uL 4.02(L) 4.25 4.03(L)  HGB 13.0 - 17.0 g/dL 11.8(L) 12.1(L) 11.6(L)  HCT 39 - 52 % 37.0(L) 39.3 37.6(L)  PLT 150 - 400 K/uL 195 170 157  NEUTROABS 1.7 - 7.7 K/uL - - -  LYMPHSABS 0.7 - 4.0 K/uL - - -     Body mass index is 24.54 kg/m.  Orders:  No orders of the defined types were placed in this encounter.  No orders of the defined types were placed in this encounter.    Procedures: No procedures performed  Clinical Data: No additional findings.  ROS:  All other systems negative, except as noted in the HPI. Review of Systems  Objective: Vital Signs: Ht 6\' 1"  (1.854 m)   Wt 186 lb (84.4 kg)   BMI 24.54 kg/m   Specialty Comments:  No specialty comments available.  PMFS History: Patient Active Problem List   Diagnosis Date Noted  .  Protein-calorie malnutrition, severe 03/05/2020  . Acute on chronic congestive heart failure ([place]) 03/03/2020  . Tobacco abuse 03/03/2020  . Pressure injury of skin 03/15/2017  . Cellulitis, leg 03/14/2017  . Cellulitis of both feet   . Cellulitis of right leg 10/30/2014  . Chronic venous insufficiency 04/28/2014  . PAD (peripheral artery disease) ([place]) 01/21/2014  . Varicose veins of lower extremities with other complications 16/03/9603  . Diabetes ([place]) 12/31/2013  . Hx of TIA (transient ischemic attack) and stroke 12/31/2013  . HLD (hyperlipidemia) 12/31/2013  . Severe obesity (BMI >= 40) ([place]) 12/31/2013  . Cellulitis of leg, left 09/13/2012  . 272.4 09/13/2012  . Essential hypertension, benign 09/13/2012  . Edema 09/13/2012   Past Medical History:  Diagnosis Date  . Anxiety   . Aortic valve disease    Severe aortic  insufficiency, moderate to severe aortic stenosis  . Arthritis   . CAD (coronary artery disease)    cath 9/21: LAD calcified, pLCx 75, mRCA 30, dRCA 30  . Chronic diastolic CHF   . COPD (chronic obstructive pulmonary disease) ([place])   . Depression   . Diabetes mellitus without complication ([place])   . GERD (gastroesophageal reflux disease)   . Hyperlipidemia   . Hypertension   . Migraine   . Obesity   . Sleep apnea    CPAP   . Venous insufficiency    managed by ortho (Dr. Sharol Given)    Family History  Problem Relation Age of Onset  . Hypertension Mother   . Alzheimer's disease Mother   . Stroke Father        x 3  . Heart attack Father   . Congestive Heart Failure Father   . Diabetes Other        mat great aunts  . Colon cancer Neg Hx   . Rectal cancer Neg Hx   . Esophageal cancer Neg Hx     Past Surgical History:  Procedure Laterality Date  . ANKLE SURGERY Right    Dr. Marcelino Scot, has pins  . KNEE ARTHROSCOPY     Dr. Noemi Chapel  . right foot surgery,rt great toe callus removal    . RIGHT/LEFT HEART CATH AND CORONARY ANGIOGRAPHY N/A 03/06/2020   Procedure: RIGHT/LEFT HEART CATH AND CORONARY ANGIOGRAPHY;  Surgeon: Sherren Mocha, MD;  Location: [facility];  Service: Cardiovascular;  Laterality: N/A;  . TEE WITHOUT CARDIOVERSION N/A 03/09/2020   Procedure: TRANSESOPHAGEAL ECHOCARDIOGRAM (TEE);  Surgeon: Dorothy Spark, MD;  Location: [facility] ENDOSCOPY;  Service: Cardiovascular;  Laterality: N/A;  . TONSILLECTOMY AND ADENOIDECTOMY     age 46   Social History   Occupational History  . Occupation: retired Youth worker: RETIRED  Tobacco Use  . Smoking status: Current Some Day Smoker    Packs/day: 2.00    Years: 20.00    Pack years: 40.00    Types: Cigarettes  . Smokeless tobacco: Never Used  Vaping Use  . Vaping Use: Never used  Substance and Sexual Activity  . Alcohol use: No    Alcohol/week: 0.0 standard drinks  . Drug use: No  . Sexual activity:  Yes    Birth control/protection: Condom

## 2020-05-12 ENCOUNTER — Ambulatory Visit: Payer: Medicare PPO | Admitting: Physician Assistant

## 2020-05-12 ENCOUNTER — Encounter: Payer: Self-pay | Admitting: Physician Assistant

## 2020-05-12 VITALS — Ht 73.0 in | Wt 186.0 lb

## 2020-05-12 DIAGNOSIS — I872 Venous insufficiency (chronic) (peripheral): Secondary | ICD-10-CM

## 2020-05-12 NOTE — Progress Notes (Signed)
Office Visit Note   Patient: Jordan [name]           Date of Birth: June 20, 1951           MRN: [id number] Visit Date: 05/12/2020              Requested by: [name], [place],  [place] [address] PCP: [name], MD  Chief Complaint  Patient presents with  . Right Leg - Follow-up    VSI  . Left Leg - Follow-up      HPI: This is a pleasant gentleman with bilateral venous insufficiency.  He had been wearing vive compression stockings unfortunately he was in the hospital for a heart issue and was not wearing them and his swelling significantly returned.  He we did have him try wearing his socks last week but unfortunately he has had some significant swelling left greater than right  Assessment & Plan: Visit Diagnoses: No diagnosis found.  Plan: Given the swelling and the weeping I think he should go forward and do [place] boots bilaterally today follow-up in 1 week.  I emphasized the importance of elevating his legs.  He is scheduled to have cardiac valve surgery sometime in the near future he will keep in touch with regards to scheduling his appointment next week  Follow-Up Instructions: No follow-ups on file.   Ortho Exam  Patient is alert, oriented, no adenopathy, well-dressed, normal affect, normal respiratory effort. Left greater than right swelling venous insufficiency left lower extremity is weepy but no cellulitis.  Mild weeping on the right.  No foul odor bilateral lower extremities have chronic venous insufficiency dermatitis  Imaging: No results found. No images are attached to the encounter.  Labs: Lab Results  Component Value Date   HGBA1C 5.3 03/05/2020   HGBA1C 5.7 (H) 03/14/2017   HGBA1C 5.0 10/30/2014   ESRSEDRATE 28 (H) 10/30/2014   CRP 6.5 (H) 10/30/2014   REPTSTATUS 03/19/2017 FINAL 03/14/2017   CULT  03/14/2017    NO GROWTH 5 DAYS Performed at [facility], [place] [address], [place], [place] [address]      Lab  Results  Component Value Date   ALBUMIN 3.1 (L) 03/03/2020   ALBUMIN 1.9 (L) 03/20/2017   ALBUMIN 1.7 (L) 03/15/2017   PREALBUMIN 9.4 (L) 10/30/2014    Lab Results  Component Value Date   MG 1.8 03/12/2020   MG 1.8 03/05/2020   MG 2.0 03/20/2017   No results found for: [facility]  Lab Results  Component Value Date   PREALBUMIN 9.4 (L) 10/30/2014   CBC EXTENDED Latest Ref Rng & Units 03/12/2020 03/11/2020 03/09/2020  WBC 4.0 - 10.5 K/uL 8.6 6.8 7.0  RBC 4.22 - 5.81 MIL/uL 4.02(L) 4.25 4.03(L)  HGB 13.0 - 17.0 g/dL 11.8(L) 12.1(L) 11.6(L)  HCT 39 - 52 % 37.0(L) 39.3 37.6(L)  PLT 150 - 400 K/uL 195 170 157  NEUTROABS 1.7 - 7.7 K/uL - - -  LYMPHSABS 0.7 - 4.0 K/uL - - -     Body mass index is 24.54 kg/m.  Orders:  No orders of the defined types were placed in this encounter.  No orders of the defined types were placed in this encounter.    Procedures: No procedures performed  Clinical Data: No additional findings.  ROS:  All other systems negative, except as noted in the HPI. Review of Systems  Objective: Vital Signs: Ht 6\' 1"  (1.854 m)   Wt 186 lb (84.4 kg)  BMI 24.54 kg/m   Specialty Comments:  No specialty comments available.  PMFS History: Patient Active Problem List   Diagnosis Date Noted  . Protein-calorie malnutrition, severe 03/05/2020  . Acute on chronic congestive heart failure ([place]) 03/03/2020  . Tobacco abuse 03/03/2020  . Pressure injury of skin 03/15/2017  . Cellulitis, leg 03/14/2017  . Cellulitis of both feet   . Cellulitis of right leg 10/30/2014  . Chronic venous insufficiency 04/28/2014  . PAD (peripheral artery disease) ([place]) 01/21/2014  . Varicose veins of lower extremities with other complications 00/86/7619  . Diabetes ([place]) 12/31/2013  . Hx of TIA (transient ischemic attack) and stroke 12/31/2013  . HLD (hyperlipidemia) 12/31/2013  . Severe obesity (BMI >= 40) ([place]) 12/31/2013  . Cellulitis of leg, left 09/13/2012  . 272.4  09/13/2012  . Essential hypertension, benign 09/13/2012  . Edema 09/13/2012   Past Medical History:  Diagnosis Date  . Anxiety   . Aortic valve disease    Severe aortic insufficiency, moderate to severe aortic stenosis  . Arthritis   . CAD (coronary artery disease)    cath 9/21: LAD calcified, pLCx 77, mRCA 30, dRCA 30  . Chronic diastolic CHF   . COPD (chronic obstructive pulmonary disease) ([place])   . Depression   . Diabetes mellitus without complication ([place])   . GERD (gastroesophageal reflux disease)   . Hyperlipidemia   . Hypertension   . Migraine   . Obesity   . Sleep apnea    CPAP   . Venous insufficiency    managed by ortho (Dr. Sharol Given)    Family History  Problem Relation Age of Onset  . Hypertension Mother   . Alzheimer's disease Mother   . Stroke Father        x 3  . Heart attack Father   . Congestive Heart Failure Father   . Diabetes Other        mat great aunts  . Colon cancer Neg Hx   . Rectal cancer Neg Hx   . Esophageal cancer Neg Hx     Past Surgical History:  Procedure Laterality Date  . ANKLE SURGERY Right    Dr. Marcelino Scot, has pins  . KNEE ARTHROSCOPY     Dr. Noemi Chapel  . right foot surgery,rt great toe callus removal    . RIGHT/LEFT HEART CATH AND CORONARY ANGIOGRAPHY N/A 03/06/2020   Procedure: RIGHT/LEFT HEART CATH AND CORONARY ANGIOGRAPHY;  Surgeon: Sherren Mocha, MD;  Location: [facility];  Service: Cardiovascular;  Laterality: N/A;  . TEE WITHOUT CARDIOVERSION N/A 03/09/2020   Procedure: TRANSESOPHAGEAL ECHOCARDIOGRAM (TEE);  Surgeon: Dorothy Spark, MD;  Location: [facility] ENDOSCOPY;  Service: Cardiovascular;  Laterality: N/A;  . TONSILLECTOMY AND ADENOIDECTOMY     age 76   Social History   Occupational History  . Occupation: retired Youth worker: RETIRED  Tobacco Use  . Smoking status: Current Some Day Smoker    Packs/day: 2.00    Years: 20.00    Pack years: 40.00    Types: Cigarettes  . Smokeless tobacco:  Never Used  Vaping Use  . Vaping Use: Never used  Substance and Sexual Activity  . Alcohol use: No    Alcohol/week: 0.0 standard drinks  . Drug use: No  . Sexual activity: Yes    Birth control/protection: Condom

## 2020-05-20 ENCOUNTER — Encounter: Payer: Self-pay | Admitting: Physician Assistant

## 2020-05-20 ENCOUNTER — Ambulatory Visit: Payer: Medicare PPO | Admitting: Physician Assistant

## 2020-05-20 DIAGNOSIS — I872 Venous insufficiency (chronic) (peripheral): Secondary | ICD-10-CM | POA: Diagnosis not present

## 2020-05-20 NOTE — H&P (Signed)
HISTORY AND PHYSICAL  MCKYLE SOLANKI is a 69 y.o. male patient with CC: Bad teeth  No diagnosis found.  Past Medical History:  Diagnosis Date  . Anxiety   . Aortic valve disease    Severe aortic insufficiency, moderate to severe aortic stenosis  . Arthritis   . CAD (coronary artery disease)    cath 9/21: LAD calcified, pLCx 65, mRCA 30, dRCA 30  . Chronic diastolic CHF   . COPD (chronic obstructive pulmonary disease) ([place])   . Depression   . Diabetes mellitus without complication ([place])   . GERD (gastroesophageal reflux disease)   . Hyperlipidemia   . Hypertension   . Migraine   . Obesity   . Sleep apnea    CPAP   . Venous insufficiency    managed by ortho (Dr. Sharol Given)    No current facility-administered medications for this encounter.   Current Outpatient Medications  Medication Sig Dispense Refill  . ascorbic acid (VITAMIN C) 500 MG tablet Take 500 mg by mouth daily.    Marland Kitchen aspirin 81 MG tablet Take 81 mg by mouth daily.    Marland Kitchen atenolol (TENORMIN) 25 MG tablet Take 1 tablet (25 mg total) by mouth daily. 90 tablet 3  . atorvastatin (LIPITOR) 80 MG tablet Take 80 mg by mouth daily after supper.     . budesonide-formoterol (SYMBICORT) 160-4.5 MCG/ACT inhaler Inhale 2 puffs into the lungs in the morning and at bedtime. 1 each 0  . butalbital-acetaminophen-caffeine (FIORICET, ESGIC) 50-325-40 MG per tablet Take 1 tablet by mouth 2 (two) times daily as needed for migraine.     . folic acid (FOLVITE) 1 MG tablet Take 1 tablet (1 mg total) by mouth daily. 30 tablet 0  . furosemide (LASIX) 40 MG tablet Take 1 tablet (40 mg total) by mouth daily. 30 tablet 0  . hydrALAZINE (APRESOLINE) 50 MG tablet Take 1 tablet (50 mg total) by mouth every 8 (eight) hours. 270 tablet 3  . ipratropium-albuterol (DUONEB) 0.5-2.5 (3) MG/3ML SOLN Take 3 mLs by nebulization every 6 (six) hours as needed (shortness of breath/wheezing). 360 mL 0  . LORazepam (ATIVAN) 1 MG tablet Take 0.5 tablets (0.5 mg  total) by mouth 2 (two) times daily as needed for anxiety. (Patient taking differently: Take 1 mg by mouth in the morning, at noon, and at bedtime. )    . montelukast (SINGULAIR) 10 MG tablet Take 10 mg by mouth daily.    . Multiple Vitamin (MULTIVITAMIN WITH MINERALS) TABS Take 1 tablet by mouth daily.    Marland Kitchen omeprazole (PRILOSEC) 20 MG capsule Take 20 mg by mouth daily.    . promethazine (PHENERGAN) 25 MG tablet Take 25 mg by mouth every 8 (eight) hours as needed for nausea/vomiting.    . venlafaxine XR (EFFEXOR-XR) 75 MG 24 hr capsule Take 75 mg by mouth in the morning and at bedtime. MUST HAVE BRAND NAME ONLY     Allergies  Allergen Reactions  . Altace [Ramipril] Other (See Comments)    Dizziness, migraine, weakness Dye in it  . Codeine Itching  . Naproxen Itching   Active Problems:   * No active hospital problems. *  Vitals: There were no vitals taken for this visit. Lab results:No results found for this or any previous visit (from the past 32 hour(s)). Radiology Results: No results found. General appearance: alert, cooperative and no distress Head: Normocephalic, without obvious abnormality, atraumatic Eyes: negative Nose: Nares normal. Septum midline. Mucosa normal. No drainage or sinus  tenderness. Throat: Multiple carious teeth. No purulence, edema, fluctuance. Pharynx clear. Neck: no adenopathy and supple, symmetrical, trachea midline Resp: clear to auscultation bilaterally Cardio: regular rate and rhythm, S1, S2 normal, no murmur, click, rub or gallop  Assessment:Multiple carious teeth.  Plan: Dental extractions with alveoloplasty. GA. Day surgery   Diona Browner 05/20/2020

## 2020-05-20 NOTE — Progress Notes (Signed)
Office Visit Note   Patient: Jordan [name]           Date of Birth: 31-Dec-1950           MRN: [id number] Visit Date: 05/20/2020              Requested by: [name], [place],  [place] [address] PCP: [name], MD  Chief Complaint  Patient presents with  . Left Leg - Pain  . Right Leg - Pain      HPI: This is a pleasant 69 year old gentleman who I have followed for bilateral venous insufficiency.  He was doing quite well with his compression socks however he had a recent hospitalization and had more swelling in his legs.  He had Profore wraps placed last week.  He is doing better.  He is awaiting valve replacement surgery and admits that he sometimes gets short of breath because of this  Assessment & Plan: Visit Diagnoses: No diagnosis found.  Plan: Patient will follow up in 3 weeks as he can fit this into his schedule.  If he has upcoming surgery we certainly can reschedule this.  Follow-Up Instructions: No follow-ups on file.   Ortho Exam  Patient is alert, oriented, no adenopathy, well-dressed, normal affect, normal respiratory effort. Bilateral lower extremity venous insufficiency.  He has venous stasis dermatitis.  He has 1 small area of skin breakdown on the lateral side of his left leg.  This does not go deeply it just is an abrasive area of the skin.  There is no foul odor and has good healthy tissue beneath no surrounding cellulitis he does have noted wrinkling of the skin.  His 2xl Vive compression socks were placed today  Imaging: No results found. No images are attached to the encounter.  Labs: Lab Results  Component Value Date   HGBA1C 5.3 03/05/2020   HGBA1C 5.7 (H) 03/14/2017   HGBA1C 5.0 10/30/2014   ESRSEDRATE 28 (H) 10/30/2014   CRP 6.5 (H) 10/30/2014   REPTSTATUS 03/19/2017 FINAL 03/14/2017   CULT  03/14/2017    NO GROWTH 5 DAYS Performed at [facility], [place] [address] [place], [place] [address]      Lab  Results  Component Value Date   ALBUMIN 3.1 (L) 03/03/2020   ALBUMIN 1.9 (L) 03/20/2017   ALBUMIN 1.7 (L) 03/15/2017   PREALBUMIN 9.4 (L) 10/30/2014    Lab Results  Component Value Date   MG 1.8 03/12/2020   MG 1.8 03/05/2020   MG 2.0 03/20/2017   No results found for: [facility]  Lab Results  Component Value Date   PREALBUMIN 9.4 (L) 10/30/2014   CBC EXTENDED Latest Ref Rng & Units 03/12/2020 03/11/2020 03/09/2020  WBC 4.0 - 10.5 K/uL 8.6 6.8 7.0  RBC 4.22 - 5.81 MIL/uL 4.02(L) 4.25 4.03(L)  HGB 13.0 - 17.0 g/dL 11.8(L) 12.1(L) 11.6(L)  HCT 39 - 52 % 37.0(L) 39.3 37.6(L)  PLT 150 - 400 K/uL 195 170 157  NEUTROABS 1.7 - 7.7 K/uL - - -  LYMPHSABS 0.7 - 4.0 K/uL - - -     There is no height or weight on file to calculate BMI.  Orders:  No orders of the defined types were placed in this encounter.  No orders of the defined types were placed in this encounter.    Procedures: No procedures performed  Clinical Data: No additional findings.  ROS:  All other systems negative, except as noted in the HPI. Review  of Systems  Objective: Vital Signs: There were no vitals taken for this visit.  Specialty Comments:  No specialty comments available.  PMFS History: Patient Active Problem List   Diagnosis Date Noted  . Protein-calorie malnutrition, severe 03/05/2020  . Acute on chronic congestive heart failure ([place]) 03/03/2020  . Tobacco abuse 03/03/2020  . Pressure injury of skin 03/15/2017  . Cellulitis, leg 03/14/2017  . Cellulitis of both feet   . Cellulitis of right leg 10/30/2014  . Chronic venous insufficiency 04/28/2014  . PAD (peripheral artery disease) ([place]) 01/21/2014  . Varicose veins of lower extremities with other complications 14/97/0263  . Diabetes ([place]) 12/31/2013  . Hx of TIA (transient ischemic attack) and stroke 12/31/2013  . HLD (hyperlipidemia) 12/31/2013  . Severe obesity (BMI >= 40) ([place]) 12/31/2013  . Cellulitis of leg, left 09/13/2012  .  272.4 09/13/2012  . Essential hypertension, benign 09/13/2012  . Edema 09/13/2012   Past Medical History:  Diagnosis Date  . Anxiety   . Aortic valve disease    Severe aortic insufficiency, moderate to severe aortic stenosis  . Arthritis   . CAD (coronary artery disease)    cath 9/21: LAD calcified, pLCx 41, mRCA 30, dRCA 30  . Chronic diastolic CHF   . COPD (chronic obstructive pulmonary disease) ([place])   . Depression   . Diabetes mellitus without complication ([place])   . GERD (gastroesophageal reflux disease)   . Hyperlipidemia   . Hypertension   . Migraine   . Obesity   . Sleep apnea    CPAP   . Venous insufficiency    managed by ortho (Dr. Sharol Given)    Family History  Problem Relation Age of Onset  . Hypertension Mother   . Alzheimer's disease Mother   . Stroke Father        x 3  . Heart attack Father   . Congestive Heart Failure Father   . Diabetes Other        mat great aunts  . Colon cancer Neg Hx   . Rectal cancer Neg Hx   . Esophageal cancer Neg Hx     Past Surgical History:  Procedure Laterality Date  . ANKLE SURGERY Right    Dr. Marcelino Scot, has pins  . KNEE ARTHROSCOPY     Dr. Noemi Chapel  . right foot surgery,rt great toe callus removal    . RIGHT/LEFT HEART CATH AND CORONARY ANGIOGRAPHY N/A 03/06/2020   Procedure: RIGHT/LEFT HEART CATH AND CORONARY ANGIOGRAPHY;  Surgeon: Sherren Mocha, MD;  Location: [facility];  Service: Cardiovascular;  Laterality: N/A;  . TEE WITHOUT CARDIOVERSION N/A 03/09/2020   Procedure: TRANSESOPHAGEAL ECHOCARDIOGRAM (TEE);  Surgeon: Dorothy Spark, MD;  Location: [facility] ENDOSCOPY;  Service: Cardiovascular;  Laterality: N/A;  . TONSILLECTOMY AND ADENOIDECTOMY     age 30   Social History   Occupational History  . Occupation: retired Youth worker: RETIRED  Tobacco Use  . Smoking status: Current Some Day Smoker    Packs/day: 2.00    Years: 20.00    Pack years: 40.00    Types: Cigarettes  . Smokeless  tobacco: Never Used  Vaping Use  . Vaping Use: Never used  Substance and Sexual Activity  . Alcohol use: No    Alcohol/week: 0.0 standard drinks  . Drug use: No  . Sexual activity: Yes    Birth control/protection: Condom

## 2020-05-21 ENCOUNTER — Encounter (HOSPITAL_COMMUNITY): Payer: Self-pay | Admitting: Oral Surgery

## 2020-05-21 ENCOUNTER — Other Ambulatory Visit: Payer: Self-pay

## 2020-05-21 NOTE — Anesthesia Preprocedure Evaluation (Addendum)
Anesthesia Evaluation  Patient identified by MRN, date of birth, ID band Patient awake    Reviewed: Allergy & Precautions, NPO status , Patient's Chart, lab work & pertinent test results, reviewed documented beta blocker date and time   History of Anesthesia Complications Negative for: history of anesthetic complications  Airway Mallampati: II  TM Distance: >3 FB Neck ROM: Full    Dental  (+) Dental Advisory Given, Missing, Poor Dentition   Pulmonary sleep apnea and Continuous Positive Airway Pressure Ventilation , COPD,  COPD inhaler, Current SmokerPatient did not abstain from smoking.,    Pulmonary exam normal        Cardiovascular hypertension, Pt. on medications and Pt. on home beta blockers + CAD, + Peripheral Vascular Disease and +CHF  + Valvular Problems/Murmurs AS and AI  Rhythm:Regular Rate:Normal + Systolic murmurs  '21 TEE - EF 60-65%. Moderately calcified and thickened aortic valve with moderately restricted leaflet openings, however transaortic gradients in the severe range (mean gradient 43 mmHg) secondary to high flow sec to severe AI. Left atrial size was mildly dilated. Trivial MR.  '21 Cath - LAD moderate calcified LCx proximal 50 RCA mid 30, distal 30 Mean AV gradient 26 mmHg, calculated AVA 1.6 cm Moderate pulm htn, mean PA pressure 34 mmHg        Neuro/Psych  Headaches, PSYCHIATRIC DISORDERS Anxiety Depression    GI/Hepatic Neg liver ROS, GERD  Medicated and Controlled,  Endo/Other  diabetes, Type 2  Renal/GU negative Renal ROS     Musculoskeletal  (+) Arthritis ,   Abdominal   Peds  Hematology negative hematology ROS (+)   Anesthesia Other Findings Covid test negative   Reproductive/Obstetrics                         Anesthesia Physical Anesthesia Plan  ASA: IV  Anesthesia Plan: General   Post-op Pain Management:    Induction: Intravenous  PONV Risk  Score and Plan: 2 and Treatment may vary due to age or medical condition, Ondansetron and Dexamethasone  Airway Management Planned: Nasal ETT  Additional Equipment: None  Intra-op Plan:   Post-operative Plan: Extubation in OR  Informed Consent: I have reviewed the patients History and Physical, chart, labs and discussed the procedure including the risks, benefits and alternatives for the proposed anesthesia with the patient or authorized representative who has indicated his/her understanding and acceptance.     Dental advisory given  Plan Discussed with: CRNA and Anesthesiologist  Anesthesia Plan Comments:       Anesthesia Quick Evaluation

## 2020-05-21 NOTE — Progress Notes (Addendum)
[name] denies chest pain and no shortness of breath, while sitting, patient is very short of breath when walking.  Patient has Aortic Stenosis and will have a TAVR in the near future. [name] will not be able to [name] to [place] to be tested for Covid- "I don't drive due to the shortness of breath and no one is available to drive me today." I explained to   patient that he will have to be at the hospital 3 hours prior to surgery, [name] voiced understanding.  [name] has type II diabetes and no longer take medications for diabetes, "I lost  around 160 lbs and my last A1C was in the 4's."  Patient reports that CBG's run 78 to 100. Dr. Reynold [name] is patient's Endocrinologist, I have requested last office records, labs and sleep study. I instructed patient to check CBG after awaking and every 2 hours until arrival  to the hospital.  I Instructed patient if CBG is less than 70 to take 4 Glucose Tablets or 1 tube of Glucose Gel. Recheck CBG in 15 minutes then call pre- op desk at [phone] for further instructions.  [name] took last dose of ASA on 05/16/20.

## 2020-05-21 NOTE — Progress Notes (Signed)
Anesthesia Chart Review: Same day workup  Patient requiring dental extractions as part of pre-TAVR work-up.  Cleared for surgery by cardiology per telephone encounter 04/08/2020, "Chart reviewed as part of pre-operative protocol coverage. Patient was contacted 04/16/2020 in reference to pre-operative risk assessment for pending surgery as outlined below.  Jordan Jordan was last seen on 03/18/20 by Jordan Dopp, PA-C. He is currently undergoing work-up for possible TAVR.  Since that day, Jordan Jordan has done fine from a cardiac standpoint. He is cautious with activity recently but has been working with physical therapy. Recent cardiac work-up included a heart catheterization which revealed moderate non-obstructive disease and an echo with EF 55-60% and severe aortic stenosis.  Therefore, based on ACC/AHA guidelines, the patient would be at acceptable risk for the planned procedure without further cardiovascular testing.   The patient was advised that if he develops new symptoms prior to surgery to contact our office to arrange for a follow-up visit, and he verbalized understanding.  If needed, patient can hold aspirin 1 week prior to his procedure with plans to restart when cleared to do so by his dentist. SBE prophylaxis is NOT needed at this time."  OSA on CPAP.   COPD maintained on Symbicort and as needed nebs.  We will need day of surgery labs and evaluation.  EKG 03/18/2020: Sinus bradycardia.  Rate 45.  LVH.  PFT 03/10/2020: FVC-%Pred-Pre Latest Units: % 49  FEV1-%Pred-Pre Latest Units: % 33  FEV1FVC-%Pred-Pre Latest Units: % 67  TLC % pred Latest Units: % 78  RV % pred Latest Units: % 137  DLCO unc % pred Latest Units: % 41    Transesophageal echocardiogram 03/09/2020 EF 60-65, no RWMA, normal RVSF, mild LAE, trivial MR, severe AI, moderate-severe aortic stenosis (mean gradient 43 mmHg), DI 0.19  Carotid US 03/07/2020 Minimal bilateral plaque without significant ICA  stenosis  Cardiac catheterization 03/06/2020 LAD moderate calcified LCx proximal 50 RCA mid 30, distal 30 Mean aortic valve gradient 26 mmHg, calculated AVA 1.6 cm Moderate pulmonary hypertension, mean PA pressure 34 mmHg  Echocardiogram 03/04/2020 EF 55-60, no RWMA, normal RVSF, moderate LAE, mild RAE, large left pleural effusion, mild AI, moderate to severe aortic stenosis (mean gradient 36.5 mmHg), DI 0.22    Jordan Jordan [facility] Short Stay Center/Anesthesiology Phone [phone] 05/21/2020 9:51 AM

## 2020-05-22 ENCOUNTER — Ambulatory Visit (HOSPITAL_COMMUNITY): Payer: Medicare PPO | Admitting: Physician Assistant

## 2020-05-22 ENCOUNTER — Other Ambulatory Visit: Payer: Self-pay

## 2020-05-22 ENCOUNTER — Encounter (HOSPITAL_COMMUNITY)
Admission: RE | Disposition: A | Discharge status: Home / Self Care | Payer: Self-pay | Source: Ambulatory Visit | Attending: Oral Surgery

## 2020-05-22 ENCOUNTER — Encounter (HOSPITAL_COMMUNITY): Payer: Self-pay | Admitting: Oral Surgery

## 2020-05-22 ENCOUNTER — Ambulatory Visit (HOSPITAL_COMMUNITY)
Admission: RE | Admit: 2020-05-22 | Discharge: 2020-05-22 | Disposition: A | Discharge status: Home / Self Care | Payer: Medicare PPO | Source: Ambulatory Visit | Attending: Oral Surgery | Admitting: Oral Surgery

## 2020-05-22 DIAGNOSIS — K029 Dental caries, unspecified: Secondary | ICD-10-CM | POA: Diagnosis not present

## 2020-05-22 DIAGNOSIS — Z886 Allergy status to analgesic agent status: Secondary | ICD-10-CM | POA: Insufficient documentation

## 2020-05-22 DIAGNOSIS — Z885 Allergy status to narcotic agent status: Secondary | ICD-10-CM | POA: Insufficient documentation

## 2020-05-22 DIAGNOSIS — Z20822 Contact with and (suspected) exposure to covid-19: Secondary | ICD-10-CM | POA: Insufficient documentation

## 2020-05-22 DIAGNOSIS — Z79899 Other long term (current) drug therapy: Secondary | ICD-10-CM | POA: Diagnosis not present

## 2020-05-22 DIAGNOSIS — Z7982 Long term (current) use of aspirin: Secondary | ICD-10-CM | POA: Insufficient documentation

## 2020-05-22 DIAGNOSIS — Z7951 Long term (current) use of inhaled steroids: Secondary | ICD-10-CM | POA: Diagnosis not present

## 2020-05-22 DIAGNOSIS — K0530 Chronic periodontitis, unspecified: Secondary | ICD-10-CM | POA: Insufficient documentation

## 2020-05-22 HISTORY — PX: MULTIPLE EXTRACTIONS WITH ALVEOLOPLASTY: SHX5342

## 2020-05-22 LAB — GLUCOSE, CAPILLARY
Glucose-Capillary: 84 mg/dL (ref 70–99)
Glucose-Capillary: 73 mg/dL (ref 70–99)
Glucose-Capillary: 103 mg/dL — ABNORMAL HIGH (ref 70–99)
Glucose-Capillary: 83 mg/dL (ref 70–99)
Glucose-Capillary: 108 mg/dL — ABNORMAL HIGH (ref 70–99)

## 2020-05-22 LAB — CBC
HCT: 30.9 % — ABNORMAL LOW (ref 39.0–52.0)
Hemoglobin: 10 g/dL — ABNORMAL LOW (ref 13.0–17.0)
MCH: 29.2 pg (ref 26.0–34.0)
MCHC: 32.4 g/dL (ref 30.0–36.0)
MCV: 90.4 fL (ref 80.0–100.0)
Platelets: 202 10*3/uL (ref 150–400)
RBC: 3.42 MIL/uL — ABNORMAL LOW (ref 4.22–5.81)
RDW: 16.4 % — ABNORMAL HIGH (ref 11.5–15.5)
WBC: 5.5 10*3/uL (ref 4.0–10.5)
nRBC: 0 % (ref 0.0–0.2)

## 2020-05-22 LAB — SARS CORONAVIRUS 2 BY RT PCR (HOSPITAL ORDER, PERFORMED IN ~~LOC~~ HOSPITAL LAB): SARS Coronavirus 2: NEGATIVE

## 2020-05-22 LAB — BASIC METABOLIC PANEL
Anion gap: 11 (ref 5–15)
BUN: 20 mg/dL (ref 8–23)
CO2: 22 mmol/L (ref 22–32)
Calcium: 8.3 mg/dL — ABNORMAL LOW (ref 8.9–10.3)
Chloride: 104 mmol/L (ref 98–111)
Creatinine, Ser: 1.29 mg/dL — ABNORMAL HIGH (ref 0.61–1.24)
GFR, Estimated: 60 mL/min — ABNORMAL LOW (ref >60)
Glucose, Bld: 78 mg/dL (ref 70–99)
Potassium: 3.9 mmol/L (ref 3.5–5.1)
Sodium: 137 mmol/L (ref 135–145)

## 2020-05-22 SURGERY — MULTIPLE EXTRACTION WITH ALVEOLOPLASTY
Anesthesia: General | Site: Mouth | Laterality: Bilateral

## 2020-05-22 MED ORDER — OXYMETAZOLINE HCL 0.05 % NA SOLN
NASAL | Status: AC
  Filled 2020-05-22: qty 30

## 2020-05-22 MED ORDER — BUPIVACAINE-EPINEPHRINE (PF) 0.25% -1:200000 IJ SOLN
INTRAMUSCULAR | Status: AC
  Filled 2020-05-22: qty 20

## 2020-05-22 MED ORDER — PROPOFOL 10 MG/ML IV BOLUS
INTRAVENOUS | Status: AC
  Filled 2020-05-22: qty 20

## 2020-05-22 MED ORDER — MIDAZOLAM HCL 2 MG/2ML IJ SOLN
INTRAMUSCULAR | Status: AC
  Filled 2020-05-22: qty 2

## 2020-05-22 MED ORDER — SUGAMMADEX SODIUM 200 MG/2ML IV SOLN
INTRAVENOUS | Status: DC | PRN
  Administered 2020-05-22: 200 mg via INTRAVENOUS

## 2020-05-22 MED ORDER — DEXTROSE 50 % IV SOLN
25.0000 mL | Freq: Once | INTRAVENOUS | Status: AC
  Administered 2020-05-22: 25 mL via INTRAVENOUS

## 2020-05-22 MED ORDER — DEXAMETHASONE SODIUM PHOSPHATE 10 MG/ML IJ SOLN
INTRAMUSCULAR | Status: AC
  Filled 2020-05-22: qty 1

## 2020-05-22 MED ORDER — ROCURONIUM BROMIDE 10 MG/ML (PF) SYRINGE
PREFILLED_SYRINGE | INTRAVENOUS | Status: AC
  Filled 2020-05-22: qty 10

## 2020-05-22 MED ORDER — LIDOCAINE-EPINEPHRINE 2 %-1:100000 IJ SOLN
INTRAMUSCULAR | Status: AC
  Filled 2020-05-22: qty 1

## 2020-05-22 MED ORDER — DEXTROSE 50 % IV SOLN
INTRAVENOUS | Status: AC
  Filled 2020-05-22: qty 50

## 2020-05-22 MED ORDER — CHLORHEXIDINE GLUCONATE 0.12 % MT SOLN: 15.0000 mL | Freq: Once | OROMUCOSAL | Status: AC

## 2020-05-22 MED ORDER — ORAL CARE MOUTH RINSE: 15.0000 mL | Freq: Once | OROMUCOSAL | Status: AC

## 2020-05-22 MED ORDER — OXYCODONE-ACETAMINOPHEN 5-325 MG PO TABS
1.0000 | ORAL_TABLET | ORAL | 0 refills | Status: DC | PRN
Start: 2020-05-22 — End: 2021-01-05

## 2020-05-22 MED ORDER — ONDANSETRON HCL 4 MG/2ML IJ SOLN
INTRAMUSCULAR | Status: AC
  Filled 2020-05-22: qty 2

## 2020-05-22 MED ORDER — DEXAMETHASONE SODIUM PHOSPHATE 10 MG/ML IJ SOLN
INTRAMUSCULAR | Status: DC | PRN
  Administered 2020-05-22: 5 mg via INTRAVENOUS

## 2020-05-22 MED ORDER — OXYCODONE HCL 5 MG PO TABS: 5.0000 mg | ORAL_TABLET | Freq: Once | ORAL | Status: DC | PRN

## 2020-05-22 MED ORDER — CEFAZOLIN SODIUM-DEXTROSE 2-4 GM/100ML-% IV SOLN
2.0000 g | INTRAVENOUS | Status: AC
  Administered 2020-05-22: 2 g via INTRAVENOUS
  Filled 2020-05-22: qty 100

## 2020-05-22 MED ORDER — OXYCODONE HCL 5 MG/5ML PO SOLN: 5.0000 mg | Freq: Once | ORAL | Status: DC | PRN

## 2020-05-22 MED ORDER — FENTANYL CITRATE (PF) 250 MCG/5ML IJ SOLN
INTRAMUSCULAR | Status: AC
  Filled 2020-05-22: qty 5

## 2020-05-22 MED ORDER — ROCURONIUM BROMIDE 10 MG/ML (PF) SYRINGE
PREFILLED_SYRINGE | INTRAVENOUS | Status: DC | PRN
  Administered 2020-05-22: 50 mg via INTRAVENOUS

## 2020-05-22 MED ORDER — SODIUM CHLORIDE 0.9 % IR SOLN
Status: DC | PRN
  Administered 2020-05-22: 250 mL

## 2020-05-22 MED ORDER — AMOXICILLIN 500 MG PO CAPS: 500.0000 mg | ORAL_CAPSULE | Freq: Three times a day (TID) | ORAL | 0 refills | Status: DC

## 2020-05-22 MED ORDER — 0.9 % SODIUM CHLORIDE (POUR BTL) OPTIME
TOPICAL | Status: DC | PRN
  Administered 2020-05-22: 1000 mL

## 2020-05-22 MED ORDER — PROPOFOL 10 MG/ML IV BOLUS
INTRAVENOUS | Status: DC | PRN
  Administered 2020-05-22: 100 mg via INTRAVENOUS

## 2020-05-22 MED ORDER — ONDANSETRON HCL 4 MG/2ML IJ SOLN
INTRAMUSCULAR | Status: DC | PRN
  Administered 2020-05-22: 4 mg via INTRAVENOUS

## 2020-05-22 MED ORDER — ONDANSETRON HCL 4 MG/2ML IJ SOLN: 4.0000 mg | Freq: Once | INTRAMUSCULAR | Status: DC | PRN

## 2020-05-22 MED ORDER — ACETAMINOPHEN 10 MG/ML IV SOLN: 1000.0000 mg | Freq: Once | INTRAVENOUS | Status: DC

## 2020-05-22 MED ORDER — CHLORHEXIDINE GLUCONATE 0.12 % MT SOLN
OROMUCOSAL | Status: AC
  Administered 2020-05-22: 15 mL via OROMUCOSAL
  Filled 2020-05-22: qty 15

## 2020-05-22 MED ORDER — LACTATED RINGERS IV SOLN: INTRAVENOUS | Status: DC

## 2020-05-22 MED ORDER — BUPIVACAINE-EPINEPHRINE 0.25% -1:200000 IJ SOLN
INTRAMUSCULAR | Status: DC | PRN
  Administered 2020-05-22: 10 mL

## 2020-05-22 MED ORDER — FENTANYL CITRATE (PF) 100 MCG/2ML IJ SOLN: 25.0000 ug | INTRAMUSCULAR | Status: DC | PRN

## 2020-05-22 MED ORDER — LIDOCAINE HCL (PF) 2 % IJ SOLN
INTRAMUSCULAR | Status: AC
  Filled 2020-05-22: qty 5

## 2020-05-22 SURGICAL SUPPLY — 43 items
BUR CROSS CUT FISSURE 1.6 (BURR) ×2 IMPLANT
BUR CROSS CUT FISSURE 1.6MM (BURR) ×1
BUR EGG ELITE 4.0 (BURR) ×2 IMPLANT
BUR EGG ELITE 4.0MM (BURR) ×1
CANISTER SUCT 3000ML PPV (MISCELLANEOUS) ×3 IMPLANT
COVER SURGICAL LIGHT HANDLE (MISCELLANEOUS) ×3 IMPLANT
COVER WAND RF STERILE (DRAPES) ×1 IMPLANT
DECANTER SPIKE VIAL GLASS SM (MISCELLANEOUS) IMPLANT
DRAPE U-SHAPE 76X120 STRL (DRAPES) ×3 IMPLANT
GAUZE PACKING FOLDED 2  STR (GAUZE/BANDAGES/DRESSINGS) ×3
GAUZE PACKING FOLDED 2 STR (GAUZE/BANDAGES/DRESSINGS) ×1 IMPLANT
GLOVE BIO SURGEON STRL SZ 6.5 (GLOVE) IMPLANT
GLOVE BIO SURGEON STRL SZ8 (GLOVE) ×3 IMPLANT
GLOVE BIO SURGEONS STRL SZ 6.5 (GLOVE)
GLOVE BIOGEL PI IND STRL 6.5 (GLOVE) IMPLANT
GLOVE BIOGEL PI IND STRL 7.0 (GLOVE) IMPLANT
GLOVE BIOGEL PI INDICATOR 6.5 (GLOVE)
GLOVE BIOGEL PI INDICATOR 7.0 (GLOVE)
GOWN STRL REUS W/ TWL LRG LVL3 (GOWN DISPOSABLE) ×1 IMPLANT
GOWN STRL REUS W/ TWL XL LVL3 (GOWN DISPOSABLE) ×1 IMPLANT
GOWN STRL REUS W/TWL LRG LVL3 (GOWN DISPOSABLE) ×3
GOWN STRL REUS W/TWL XL LVL3 (GOWN DISPOSABLE) ×3
IV NS 1000ML (IV SOLUTION) ×3
IV NS 1000ML BAXH (IV SOLUTION) ×1 IMPLANT
KIT BASIN OR (CUSTOM PROCEDURE TRAY) ×3 IMPLANT
KIT TURNOVER KIT B (KITS) ×3 IMPLANT
NDL 18GX1X1/2 (RX/OR ONLY) (NEEDLE) IMPLANT
NDL HYPO 25GX1X1/2 BEV (NEEDLE) ×2 IMPLANT
NDL PRECISIONGLIDE 27X1.5 (NEEDLE) IMPLANT
NEEDLE 18GX1X1/2 (RX/OR ONLY) (NEEDLE) ×3 IMPLANT
NEEDLE HYPO 25GX1X1/2 BEV (NEEDLE) ×6 IMPLANT
NEEDLE PRECISIONGLIDE 27X1.5 (NEEDLE) IMPLANT
NS IRRIG 1000ML POUR BTL (IV SOLUTION) ×3 IMPLANT
PAD ARMBOARD 7.5X6 YLW CONV (MISCELLANEOUS) ×3 IMPLANT
POSITIONER HEAD DONUT 9IN (MISCELLANEOUS) ×3 IMPLANT
SLEEVE IRRIGATION ELITE 7 (MISCELLANEOUS) ×3 IMPLANT
SPONGE SURGIFOAM ABS GEL 12-7 (HEMOSTASIS) IMPLANT
SUT CHROMIC 3 0 PS 2 (SUTURE) ×3 IMPLANT
SYR 20CC LL (SYRINGE) ×2 IMPLANT
SYR CONTROL 10ML LL (SYRINGE) ×3 IMPLANT
TRAY ENT MC OR (CUSTOM PROCEDURE TRAY) ×3 IMPLANT
TUBING IRRIGATION (MISCELLANEOUS) ×1 IMPLANT
YANKAUER SUCT BULB TIP NO VENT (SUCTIONS) ×3 IMPLANT

## 2020-05-22 NOTE — Progress Notes (Addendum)
Notified Dr. Fransisco Beau of pt's blood pressure. Pt. Denies symptoms.No new orders.  Pt.'s blood sugar 73, pt. Is not symptomatic. Dr. Fransisco Beau gave orders to give 1/2 amp dextrose. Will continue to monitor.

## 2020-05-22 NOTE — H&P (Signed)
H&P documentation  -History and Physical Reviewed  -Patient has been re-examined  -No change in the plan of care  Jordan [name]  

## 2020-05-22 NOTE — Op Note (Signed)
05/22/2020  1:08 PM  PATIENT:  Jordan [name]  69 y.o. male  PRE-OPERATIVE DIAGNOSIS:  NONRESTORABLE TEETH #4, 6, 7, 8, 9, 10, 11, 12, 22, 23, 24, 25, 26, 27, 28, 29  POST-OPERATIVE DIAGNOSIS:  SAME  PROCEDURE:  Procedure(s): MULTIPLE EXTRACTION TEETH #4, 6, 7, 8, 9, 10, 11, 12, 22, 23, 24, 25, 26, 27, 28, 29 WITH ALVEOLOPLASTY RIGHT AND LEFT MAXILLA AND MANDIBLER  SURGEON:  Surgeon(s): Diona Browner, DMD  ANESTHESIA:   local and general  EBL:  minimal  DRAINS: none   SPECIMEN:  No Specimen  COUNTS:  YES  PLAN OF CARE: Discharge to home after PACU  PATIENT DISPOSITION:  PACU - hemodynamically stable.   PROCEDURE DETAILS: Dictation #[id number]  Gae Bon, DMD 05/22/2020 1:08 PM

## 2020-05-22 NOTE — Anesthesia Procedure Notes (Signed)
Procedure Name: Intubation Date/Time: 05/22/2020 12:26 PM Performed by: Oletta Lamas, CRNA Pre-anesthesia Checklist: Patient identified, Emergency Drugs available, Suction available and Patient being monitored Patient Re-evaluated:Patient Re-evaluated prior to induction Oxygen Delivery Method: Circle System Utilized Preoxygenation: Pre-oxygenation with 100% oxygen Induction Type: IV induction Ventilation: Mask ventilation without difficulty Laryngoscope Size: Mac and 4 Grade View: Grade I Tube type: Oral Nasal Tubes: Left, Nasal Rae and Nasal prep performed Tube size: 7.5 mm Number of attempts: 1 Airway Equipment and Method: Stylet Placement Confirmation: ETT inserted through vocal cords under direct vision,  positive ETCO2 and breath sounds checked- equal and bilateral Secured at: 27 cm Tube secured with: Tape Dental Injury: Teeth and Oropharynx as per pre-operative assessment

## 2020-05-22 NOTE — Transfer of Care (Signed)
Immediate Anesthesia Transfer of Care Note  Patient: Jordan [name]  Procedure(s) Performed: MULTIPLE EXTRACTION WITH ALVEOLOPLASTY (Bilateral Mouth)  Patient Location: PACU  Anesthesia Type:General  Level of Consciousness: drowsy, patient cooperative and responds to stimulation  Airway & Oxygen Therapy: Patient Spontanous Breathing and Patient connected to nasal cannula oxygen  Post-op Assessment: Report given to RN and Post -op Vital signs reviewed and stable  Post vital signs: Reviewed and stable  Last Vitals:  Vitals Value Taken Time  BP 151/59 05/22/20 1323  Temp    Pulse 67 05/22/20 1325  Resp 19 05/22/20 1325  SpO2 96 % 05/22/20 1325  Vitals shown include unvalidated device data.  Last Pain:  Vitals:   05/22/20 0834  TempSrc:   PainSc: 0-No pain      Patients Stated Pain Goal: 6 (32/99/24 2683)  Complications: No complications documented.

## 2020-05-23 NOTE — Op Note (Signed)
NAME: BAYLEN, BUCKNER MEDICAL RECORD [id number] ACCOUNT [id number] DATE OF BIRTH:02-24-1951 FACILITY: [facility] LOCATION: [facility] PHYSICIAN:Ascencion Stegner M. Icelyn Navarrete, DDS  OPERATIVE REPORT  DATE OF PROCEDURE:  05/22/2020  PREOPERATIVE DIAGNOSIS:  Nonrestorable teeth secondary to dental caries and periodontitis numbers 4, 6, 7, 8, 9, 10, 11, 12, 22, 23, 24, 25, 26, 27, 28, 29.  POSTOPERATIVE DIAGNOSES:  Nonrestorable teeth secondary to dental caries and periodontitis numbers 4, 6, 7, 8, 9, 10, 11, 12, 22, 23, 24, 25, 26, 27, 28, 29.  PROCEDURE:  Multiple extraction of teeth numbers 4, 6, 7, 8, 9, 10, 11, 12, 22, 23, 24, 25, 26, 27, 28, 29 alveoplasty right and left maxilla and mandible.  SURGEON:  Diona Browner, DDS  ANESTHESIA:  General, nasal intubation.  Dr. Fransisco Beau attending.  DESCRIPTION OF PROCEDURE:  The patient was taken to the operating room and placed on the table in supine position.  General anesthesia was administered and nasal endotracheal tube was placed and secured.  The eyes were protected and the patient was  draped for surgery.  A timeout was performed.  The posterior pharynx was suctioned and a throat pack was placed.  0.25% Marcaine, 1:200,000 epinephrine was infiltrated in an inferior alveolar block on the right and left sides and in buccal and palatal  infiltration in the maxilla.  A bite block was placed on the right side of the mouth and a sweetheart retractor was used to retract the tongue.  A #15 blade was used to make an incision 1 cm proximal to tooth #22 on the alveolar crest.  The incision was  carried across the midline in the gingival sulcus both buccally and lingually from tooth #22 to tooth #26.  The periosteum was reflected from around these teeth.  The teeth were elevated with a 301 elevator and removed from the mouth with the dental  forceps.  The sockets were curetted and then the periosteum was reflected to expose the alveolar ridge.  There were multiple sharp  bony projections and contour irregularities owing to the severity of periodontal disease.  Alveoplasty was performed using  an egg bur followed by the bone file.  Then, the area was irrigated and closed with 3-0 chromic.  In the left maxilla the 15 blade was used to make an incision 1 cm proximal to tooth #12 along the alveolar crest.  The incision was taken both palatally  and buccally in the gingival sulcus around teeth numbers 12, 11, 10, 9, 8, 7.  The periosteum was reflected from around the teeth.  The teeth were then elevated and removed with the 301 elevator and dental forceps.  The sockets were curetted.  The  periosteum was reflected and again the alveolar crest was irregular in contour and had sharp bony projections.  This was smoothed using an alveoplasty with the egg bur under irrigation and Stryker handpiece followed by the bone file.  The area was then  irrigated and closed with 3-0 chromic.  Then, the bite block and sweetheart retractor were repositioned to the other side of the mouth.  Tooth #27, 28, 29 were removed using the 15 blade to make a routine incision in the sulcus and reflection of the  periosteum with a periosteal elevator.  Then, the teeth were elevated and removed with the dental forceps.  In the maxilla, teeth numbers 4 and 6 were removed using a 15 blade to make an incision in the sulcus of the teeth and an incision was carried on  the alveolar  crest to join the incision at tooth #4 and 6 together.  Then, the periosteum was reflected.  The teeth were elevated with a 301 elevator and removed from the mouth with the dental forceps.  The sockets were then curetted and then the  periosteum was reflected in the right maxilla and mandible to expose the alveolar crest.  There was again irregularity owing to the periodontal disease and sharp bony projections.  Alveoplasty was therefore performed using the egg bur followed by the  bone file.  Then, the areas were irrigated and closed  with 3-0 chromic.  The oral cavity was then irrigated and suctioned.  The patient was left in care of anesthesia for extubation and transport to recovery room with plans for discharge home through day  surgery.  ESTIMATED BLOOD LOSS:  Minimal.  COMPLICATIONS:  None.  SPECIMENS:  None.  HN/NUANCE  D:05/22/2020 T:05/23/2020 JOB:[id number]

## 2020-05-23 NOTE — Anesthesia Postprocedure Evaluation (Signed)
Anesthesia Post Note  Patient: Jordan [name]  Procedure(s) Performed: MULTIPLE EXTRACTION WITH ALVEOLOPLASTY (Bilateral Mouth)     Patient location during evaluation: PACU Anesthesia Type: General Level of consciousness: awake Pain management: pain level controlled Vital Signs Assessment: post-procedure vital signs reviewed and stable Respiratory status: spontaneous breathing, nonlabored ventilation, respiratory function stable and patient connected to nasal cannula oxygen Cardiovascular status: blood pressure returned to baseline and stable Postop Assessment: no apparent nausea or vomiting Anesthetic complications: no   No complications documented.  Last Vitals:  Vitals:   05/22/20 1355 05/22/20 1400  BP: (!) 146/62 (!) 164/64  Pulse: (!) 56 62  Resp: 18 20  Temp:    SpO2: 93% 91%    Last Pain:  Vitals:   05/22/20 1355  TempSrc:   PainSc: 0-No pain                 Hogan Hoobler P Kharee Lesesne

## 2020-05-24 ENCOUNTER — Encounter (HOSPITAL_COMMUNITY): Payer: Self-pay | Admitting: Oral Surgery

## 2020-05-25 ENCOUNTER — Encounter (HOSPITAL_COMMUNITY): Payer: Self-pay | Admitting: Oral Surgery

## 2020-06-01 NOTE — Progress Notes (Signed)
[facility]                                       Cardiology Office Note    Date:  06/03/2020   ID:  Jordan [name], DOB 02-14-1951, MRN [id number]  PCP:  [name], MD  Cardiologist:  Dr. Marlou Porch   CC: severe AS/AI, discuss upcoming TAVR  History of Present Illness:  Jordan [name] is a 69 y.o. male with a history of DMT2, HTN, HLD, OSA on CPAP, chronic venous insufficiency with bilateral lower extremity edema and history of venous stasis ulcers, ongoing tobacco abuse with COPD, arthritis and severe AS with plans for TAVR who presents to clinic for follow up.   He was admitted in September 2021 with acute hypoxic respiratory failure due to acute diastolic congestive heart failure and aortic valve disease.  He presented with a couple week history of progressive exertional shortness of breath and chest x-ray on admission showed pulmonary edema and bilateral pleural effusions.  He improved with IV diuresis which was complicated by acute kidney injury.  This resolved and he was maintained on oral Lasix.  He had a 2D echocardiogram on 03/04/2020 which had poor windows but reportedly showed mild aortic insufficiency and moderate to severe aortic stenosis with a mean gradient of 36.5 mmHg and a valve area of 0.68 cm.  He subsequently had a TEE done on 03/09/2020 which showed a calcified and thickened aortic valve with restricted leaflet opening and a mean gradient of 43 mmHg.  There was severe aortic insufficiency.  Left ventricular ejection fraction was 60 to 65%.  Cardiac catheterization showed nonobstructive coronary disease.    The patient has been evaluated by the multidisciplinary valve team and felt to have severe, symptomatic aortic stenosis and to be a suitable candidate for TAVR, which will be set up for next week. Surgery has been pushed out so that he could get dental extractions by Dr. Hoyt Koch.   Today he presents to clinic for  follow up. No complaints except chronic shortness of breath. No chest pain. No CP or SOB. He has chronic LE edema that is stable. No orthopnea or PND. No dizziness or syncope. No blood in stool or urine. No palpitations. Ready to get surgery over with .     Past Medical History:  Diagnosis Date  . Anxiety   . Aortic valve disease    Severe aortic insufficiency, moderate to severe aortic stenosis  . Arthritis    knee and neck  . CAD (coronary artery disease)    cath 9/21: LAD calcified, pLCx 60, mRCA 30, dRCA 30  . Chronic diastolic CHF   . COPD (chronic obstructive pulmonary disease) ([place])   . Depression   . Diabetes mellitus without complication ([place])    Type II - not on medication "I lost about 160 lbs"   . Dyspnea   . GERD (gastroesophageal reflux disease)   . Heart murmur   . Hyperlipidemia   . Hypertension   . Migraine   . Obesity   . Sleep apnea    CPAP   . Venous insufficiency    managed by ortho (Dr. Sharol Given)    Past Surgical History:  Procedure Laterality Date  . ANKLE SURGERY Right    Dr. Marcelino Scot, has pins  . COLONOSCOPY W/ POLYPECTOMY    . KNEE ARTHROSCOPY Right  Dr. Noemi Chapel  . MULTIPLE EXTRACTIONS WITH ALVEOLOPLASTY Bilateral 05/22/2020   Procedure: MULTIPLE EXTRACTION WITH ALVEOLOPLASTY;  Surgeon: Diona Browner, DMD;  Location: [facility];  Service: Oral Surgery;  Laterality: Bilateral;  . right foot surgery,rt great toe callus removal    . RIGHT/LEFT HEART CATH AND CORONARY ANGIOGRAPHY N/A 03/06/2020   Procedure: RIGHT/LEFT HEART CATH AND CORONARY ANGIOGRAPHY;  Surgeon: Sherren Mocha, MD;  Location: [facility];  Service: Cardiovascular;  Laterality: N/A;  . TEE WITHOUT CARDIOVERSION N/A 03/09/2020   Procedure: TRANSESOPHAGEAL ECHOCARDIOGRAM (TEE);  Surgeon: Dorothy Spark, MD;  Location: [facility] ENDOSCOPY;  Service: Cardiovascular;  Laterality: N/A;  . TONSILLECTOMY AND ADENOIDECTOMY     age 30    Current Medications: Outpatient Medications Prior to Visit   Medication Sig Dispense Refill  . ascorbic acid (VITAMIN C) 500 MG tablet Take 500 mg by mouth daily.    Marland Kitchen aspirin 81 MG tablet Take 81 mg by mouth daily.    Marland Kitchen atenolol (TENORMIN) 25 MG tablet Take 1 tablet (25 mg total) by mouth daily. 90 tablet 3  . atorvastatin (LIPITOR) 80 MG tablet Take 80 mg by mouth daily after supper.     . budesonide-formoterol (SYMBICORT) 160-4.5 MCG/ACT inhaler Inhale 2 puffs into the lungs in the morning and at bedtime. 1 each 0  . butalbital-acetaminophen-caffeine (FIORICET, ESGIC) 50-325-40 MG per tablet Take 1 tablet by mouth 2 (two) times daily as needed for migraine.     . folic acid (FOLVITE) 1 MG tablet Take 1 tablet (1 mg total) by mouth daily. 30 tablet 0  . furosemide (LASIX) 40 MG tablet Take 1 tablet (40 mg total) by mouth daily. 30 tablet 0  . hydrALAZINE (APRESOLINE) 50 MG tablet Take 1 tablet (50 mg total) by mouth every 8 (eight) hours. 270 tablet 3  . ipratropium-albuterol (DUONEB) 0.5-2.5 (3) MG/3ML SOLN Take 3 mLs by nebulization every 6 (six) hours as needed (shortness of breath/wheezing). 360 mL 0  . LORazepam (ATIVAN) 1 MG tablet Take 0.5 tablets (0.5 mg total) by mouth 2 (two) times daily as needed for anxiety.    . montelukast (SINGULAIR) 10 MG tablet Take 10 mg by mouth daily.    . Multiple Vitamin (MULTIVITAMIN WITH MINERALS) TABS Take 1 tablet by mouth daily.    Marland Kitchen omeprazole (PRILOSEC) 20 MG capsule Take 20 mg by mouth daily.    Marland Kitchen oxyCODONE-acetaminophen (PERCOCET) 5-325 MG tablet Take 1 tablet by mouth every 4 (four) hours as needed. 20 tablet 0  . promethazine (PHENERGAN) 25 MG tablet Take 25 mg by mouth every 8 (eight) hours as needed for nausea/vomiting.    . venlafaxine XR (EFFEXOR-XR) 75 MG 24 hr capsule Take 75 mg by mouth in the morning and at bedtime. MUST HAVE BRAND NAME ONLY    . amoxicillin (AMOXIL) 500 MG capsule Take 1 capsule (500 mg total) by mouth 3 (three) times daily. (Patient not taking: No sig reported) 21 capsule 0    No facility-administered medications prior to visit.     Allergies:   Altace [ramipril], Codeine, and Naproxen   Social History   Socioeconomic History  . Marital status: Single    Spouse name: Not on file  . Number of children: 0  . Years of education: Not on file  . Highest education level: Not on file  Occupational History  . Occupation: retired Youth worker: RETIRED  Tobacco Use  . Smoking status: Current Some Day Smoker    Packs/day: 0.75  Years: 55.00    Pack years: 41.25    Types: Cigarettes  . Smokeless tobacco: Never Used  Vaping Use  . Vaping Use: Never used  Substance and Sexual Activity  . Alcohol use: No    Alcohol/week: 0.0 standard drinks  . Drug use: No  . Sexual activity: Yes    Birth control/protection: Condom  Other Topics Concern  . Not on file  Social History Narrative  . Not on file   Social Determinants of Health   Financial Resource Strain: Not on file  Food Insecurity: Not on file  Transportation Needs: Not on file  Physical Activity: Not on file  Stress: Not on file  Social Connections: Not on file     Family History:  The patient's family history includes Alzheimer's disease in his mother; Congestive Heart Failure in his father; Diabetes in an other family member; Heart attack in his father; Hypertension in his mother; Stroke in his father.     ROS:   Please see the history of present illness.    ROS All other systems reviewed and are negative.   PHYSICAL EXAM:   VS:  BP (!) 130/40   Pulse (!) 59   Ht 6\' 1"  (1.854 m)   Wt 195 lb (88.5 kg)   SpO2 93%   BMI 25.73 kg/m    GEN: Well nourished, well developed, in no acute distress, appears older than his stated age.  HEENT: normal Neck: no JVD or masses Cardiac: RRR; 3/6 systolic. No rubs, or gallops. 2+ chronic LE edema wearing compression stockings.  Respiratory:  clear to auscultation bilaterally, normal work of breathing GI: soft, nontender,  nondistended, + BS MS: no deformity or atrophy Skin: warm and dry, no rash Neuro:  Alert and Oriented x 3, Strength and sensation are intact Psych: euthymic mood, full affect   Wt Readings from Last 3 Encounters:  06/03/20 195 lb (88.5 kg)  05/22/20 177 lb (80.3 kg)  05/12/20 186 lb (84.4 kg)      Studies/Labs Reviewed:   EKG:  EKG is NOT ordered today.    Recent Labs: 03/03/2020: ALT 13; B Natriuretic Peptide 2,454.0 03/12/2020: Magnesium 1.8 05/22/2020: BUN 20; Creatinine, Ser 1.29; Hemoglobin 10.0; Platelets 202; Potassium 3.9; Sodium 137   Lipid Panel    Component Value Date/Time   CHOL 108 03/06/2020 0508   TRIG 68 03/06/2020 0508   HDL 30 (L) 03/06/2020 0508   CHOLHDL 3.6 03/06/2020 0508   VLDL 14 03/06/2020 0508   LDLCALC 64 03/06/2020 0508    Additional studies/ records that were reviewed today include:  R/L heart cath 03/06/20 1. Nonobstructive coronary artery disease with mild nonobstructive plaquing in the RCA, wide patency of the left main, wide patency of the LAD, and mild to moderate mid circumflex stenosis 2. Moderate aortic stenosis with mean gradient 26 mmHg and calculated aortic valve area 1.6 cm. 3. Moderate pulmonary hypertension with mean PA pressure 34 mmHg, transpulmonary gradient 13 mmHg, PVR less than 2 Wood units  _______________   Echo 03/04/20 1. Left ventricular ejection fraction, by estimation, is 55 to 60%. The  left ventricle has normal function. The left ventricle has no regional  wall motion abnormalities. Left ventricular diastolic function could not  be evaluated.  2. Right ventricular systolic function is normal. The right ventricular  size is normal.  3. Left atrial size was moderately dilated.  4. Right atrial size was mildly dilated.  5. Large pleural effusion in the left lateral region.  6. The mitral valve is normal in structure. No evidence of mitral valve  regurgitation.  7. The aortic valve is grossly normal.  There is moderate calcification of  the aortic valve. There is moderate thickening of the aortic valve. Aortic  valve regurgitation is mild. Moderate to severe aortic valve stenosis.    _______________  TEE 03/09/20 IMPRESSIONS 1. Moderately calcified and thickened aortic valve with moderately  restricted leaflet openings, however transaortic gradients in the severe  range (mean gradient 43 mmHg) secondary to high flow sec to severe aortic  regurgitation.  2. Left ventricular ejection fraction, by estimation, is 60 to 65%. The  left ventricle has normal function. The left ventricle has no regional  wall motion abnormalities.  3. Right ventricular systolic function is normal. The right ventricular  size is normal.  4. Left atrial size was mildly dilated. No left atrial/left atrial  appendage thrombus was detected.  5. The mitral valve is normal in structure. Trivial mitral valve  regurgitation. No evidence of mitral stenosis.  6. The aortic valve is normal in structure. Aortic valve regurgitation is  severe. Moderate to severe aortic valve stenosis. Aortic valve mean  gradient measures 43.0 mmHg.  7. The inferior vena cava is normal in size with greater than 50%  respiratory variability, suggesting right atrial pressure of 3 mmHg.   Conclusion(s)/Recommendation(s): Normal biventricular function without  evidence of hemodynamically significant valvular heart disease.   FINDINGS  Left Ventricle: Left ventricular ejection fraction, by estimation, is 60  to 65%. The left ventricle has normal function. The left ventricle has no  regional wall motion abnormalities. The left ventricular internal cavity  size was normal in size. There is  no left ventricular hypertrophy.   Right Ventricle: The right ventricular size is normal. No increase in  right ventricular wall thickness. Right ventricular systolic function is  normal.   Left Atrium: Left atrial size was mildly  dilated. No left atrial/left  atrial appendage thrombus was detected.   Right Atrium: Right atrial size was normal in size.   Pericardium: There is no evidence of pericardial effusion.   Mitral Valve: The mitral valve is normal in structure. Trivial mitral  valve regurgitation. No evidence of mitral valve stenosis.   Tricuspid Valve: The tricuspid valve is normal in structure. Tricuspid  valve regurgitation is mild . No evidence of tricuspid stenosis.   Aortic Valve: The aortic valve is normal in structure. Aortic valve  regurgitation is severe. Aortic regurgitation PHT measures 424 msec.  Moderate to severe aortic stenosis is present. Aortic valve mean gradient  measures 43.0 mmHg. Aortic valve peak  gradient measures 73.8 mmHg.   Pulmonic Valve: The pulmonic valve was normal in structure. Pulmonic valve  regurgitation is not visualized. No evidence of pulmonic stenosis.   Aorta: The aortic root is normal in size and structure.   Venous: The inferior vena cava is normal in size with greater than 50%  respiratory variability, suggesting right atrial pressure of 3 mmHg.   IAS/Shunts: No atrial level shunt detected by color flow Doppler.     AORTIC VALVE  AV Vmax:      429.50 cm/s  AV Vmean:     301.500 cm/s  AV VTI:      0.874 m  AV Peak Grad:   73.8 mmHg  AV Mean Grad:   43.0 mmHg  LVOT Vmax:     88.90 cm/s  LVOT Vmean:    59.250 cm/s  LVOT VTI:  0.165 m  LVOT/AV VTI ratio: 0.19  AI PHT:      424 msec     SHUNTS  Systemic VTI: 0.16 m    __________________   Cardiac CT 03/07/20 ADDENDUM REPORT: 03/09/2020 16:59  CLINICAL DATA: 69 year old male with severe aortic stenosis being evaluated for a TAVR procedure.  EXAM: Cardiac TAVR CT  TECHNIQUE: The patient was scanned on a [employer]. A 120 kV retrospective scan was triggered in the descending thoracic aorta at 111 HU's. Gantry rotation speed  was 250 msecs and collimation was .6 mm. No beta blockade or nitro were given. The 3D data set was reconstructed in 5% intervals of the R-R cycle. Systolic and diastolic phases were analyzed on a dedicated work station using MPR, MIP and VRT modes. The patient received 80 cc of contrast.  FINDINGS: Aortic Valve: Trileaflet with severely calcified leaflets with moderately restricted leaflet opening and no calcifications extending into the LVOT.  Aorta: Normal size with mild diffuse atherosclerotic plaque and calcifications and no dissection.  Sinotubular Junction: 26 x 25 mm  Ascending Thoracic Aorta: 33 x 32 mm  Aortic Arch: 26 x 25 mm  Descending Thoracic Aorta: 28 x 28 mm  Sinus of Valsalva Measurements:  Non-coronary: 31 mm  Right -coronary: 31 mm  Left -coronary: 34 mm  Coronary Artery Height above Annulus:  Left Main: 15 mm  Right Coronary: 16 mm  Virtual Basal Annulus Measurements:  Maximum/Minimum Diameter: 29.3 x 22.9 mm  Mean Diameter: 26.0 mm  Perimeter: 84 mm  Area: 532 mm2  Optimum Fluoroscopic Angle for Delivery: RAO 3 CRA 4.  IMPRESSION: 1. Trileaflet aortic valve with severely calcified leaflets with moderately restricted leaflet opening and no calcifications extending into the LVOT. Aortic valve calcium score 2271 consistent with severe aortic stenosis. Annular measurements suitable for delivery of 26 mm Edwards-SAPIEN 3 Ultra valve.  2. Sufficient coronary to annulus distance.  3. Optimum Fluoroscopic Angle for Delivery: RAO 3 CRA 4.  4. No thrombus in the left atrial appendage.   _________________  CTA chest/abd/pelvis 03/07/20 Narrative & Impression  CLINICAL DATA: Inpatient. Severe aortic stenosis. Acute on chronic heart failure. TAVR evaluation.  EXAM: CT ANGIOGRAPHY CHEST, ABDOMEN AND PELVIS  TECHNIQUE: Multidetector CT imaging through the chest, abdomen and pelvis was performed using the  standard protocol during bolus administration of intravenous contrast. Multiplanar reconstructed images and MIPs were obtained and reviewed to evaluate the vascular anatomy.  CONTRAST: 1106mL OMNIPAQUE IOHEXOL 350 MG/ML SOLN  COMPARISON: 03/04/2020 chest radiograph.  FINDINGS: CTA CHEST FINDINGS  Cardiovascular: Mild cardiomegaly. No significant pericardial effusion/thickening. Three-vessel coronary atherosclerosis. Atherosclerotic nonaneurysmal thoracic aorta, including ascending thoracic aortic atherosclerotic calcification. Diffuse thickening and coarse calcification of the aortic valve. Normal caliber pulmonary arteries. No central pulmonary emboli.  Mediastinum/Nodes: No discrete thyroid nodules. Unremarkable esophagus. No axillary adenopathy. Mild right paratracheal adenopathy up to 1.4 cm (series 6/image 60). Mildly enlarged 1.1 cm subcarinal node (series 6/image 70). Mild AP window adenopathy up to 1.3 cm (series 6/image 59). Mild bilateral hilar adenopathy up to 1.4 cm on the right (series 6/image 66) and 1.2 cm on the left (series 6/image 65).  Lungs/Pleura: No pneumothorax. Moderate dependent bilateral pleural effusions. Moderate dependent bilateral lower lobe passive atelectasis. Mild diffuse interlobular septal thickening. No acute consolidative airspace disease, lung masses or significant pulmonary nodules.  Musculoskeletal: No aggressive appearing focal osseous lesions. Incompletely healed posterior right ninth through twelfth rib fractures. Moderate thoracic spondylosis.  CTA ABDOMEN AND PELVIS FINDINGS  Hepatobiliary: Normal  liver with no liver mass. Normal gallbladder with no radiopaque cholelithiasis. No biliary ductal dilatation.  Pancreas: Normal, with no mass or duct dilation.  Spleen: Normal size. No mass.  Adrenals/Urinary Tract: Normal adrenals. No hydronephrosis. Simple 2.5 cm anterior upper left renal cyst. Additional  subcentimeter hypodense renal cortical lesions scattered in both kidneys are too small to characterize and require no follow-up. Excreted contrast in the bladder. Mild diffuse bladder wall thickening and trabeculation with tiny bladder diverticula.  Stomach/Bowel: Normal non-distended stomach. Normal caliber small bowel with no small bowel wall thickening. Normal appendix. Moderate colorectal stool. No large bowel wall thickening, diverticulosis or significant pericolonic fat stranding.  Vascular/Lymphatic: Atherosclerotic nonaneurysmal abdominal aorta. Mild porta hepatis adenopathy up to 1.3 cm (series 6/image 119). No pelvic adenopathy.  Reproductive: Top-normal size prostate.  Other: No pneumoperitoneum, ascites or focal fluid collection.  Musculoskeletal: No aggressive appearing focal osseous lesions. Marked lumbar spondylosis.  VASCULAR MEASUREMENTS PERTINENT TO TAVR:  AORTA:  Minimal Aortic Diameter-17.3 x 17.2 mm  Severity of Aortic Calcification-moderate  RIGHT PELVIS:  Right Common Iliac Artery -  Minimal Diameter-8.1 x 6.7 mm  Tortuosity-moderate  Calcification-severe  Right External Iliac Artery -  Minimal Diameter-8.4 x 7.8 mm  Tortuosity-moderate  Calcification-mild-to-moderate  Right Common Femoral Artery -  Minimal Diameter-9.5 x 8.0 mm  Tortuosity-mild  Calcification-severe  LEFT PELVIS:  Left Common Iliac Artery -  Minimal Diameter-8.3 x 7.2 mm  Tortuosity-moderate  Calcification-severe  Left External Iliac Artery -  Minimal Diameter-7.6 x 6.8 mm  Tortuosity-mild-to-moderate  Calcification-moderate  Left Common Femoral Artery -  Minimal Diameter-9.9 x 8.0 mm  Tortuosity-mild  Calcification-moderate  Review of the MIP images confirms the above findings.  IMPRESSION: 1. Vascular findings and measurements pertinent to potential TAVR procedure, as detailed. 2. Diffuse thickening  and coarse calcification of the aortic valve, compatible with the reported history of severe aortic stenosis. 3. Mild cardiomegaly. Three-vessel coronary atherosclerosis. 4. Moderate dependent bilateral pleural effusions. 5. Mild diffuse interlobular septal thickening, compatible with mild pulmonary edema. 6. Mild porta hepatis, mild mediastinal and mild bilateral hilar lymphadenopathy, nonspecific, potentially reactive. Suggest follow-up CT chest, abdomen and pelvis with IV contrast in 3 months. 7. Mild diffuse bladder wall thickening and trabeculation with tiny bladder diverticula, suggesting nonspecific chronic bladder voiding dysfunction. 8. Moderate colorectal stool, suggesting constipation. 9. Incompletely healed posterior right ninth through twelfth rib fractures. 10. Aortic Atherosclerosis (ICD10-I70.0).     ASSESSMENT & PLAN:   Severe AI/moderate AS: confirmed by TEE which showed moderately calcified and thickened aortic valve with moderately restricted leaflet openings, however transaortic gradients in the severe range (mean gradient 43 mmHg) secondary to high flow sec to severe aortic regurgitation. Cardiac gated CTA of the heart revealed anatomical characteristics consistent with aortic stenosis suitable for treatment by transcatheter aortic valve replacement without any significant complicating features and CTA of the aorta and iliac vessels demonstrate what appear to be adequate pelvic vascular access to facilitate a transfemoral approach.He sizes to a size 26 mm Edwards Sapien S3U valve. He has continued dyspnea with NYHA class II-III symptoms. The patient has been evaluated by the multidisciplinary valve team and felt to have severe, symptomatic aortic stenosis/regurigiation and to be a suitable candidate for TAVR, which was set up for 12/21.    Medication Adjustments/Labs and Tests Ordered: Current medicines are reviewed at length with the patient today.  Concerns  regarding medicines are outlined above.  Medication changes, Labs and Tests ordered today are listed in the Patient Instructions below. Patient  Instructions  Medication Instructions:  No changes *If you need a refill on your cardiac medications before your next appointment, please call your pharmacy*   Lab Work: none   Follow-Up: Will be arranged upon hospital discharge after surgery.       Signed, Angelena Form, PA-C  06/03/2020 3:27 PM    [facility] [place], [place], [place]  [address] Phone: [phone]; Fax: [phone]

## 2020-06-02 ENCOUNTER — Other Ambulatory Visit: Payer: Self-pay

## 2020-06-02 ENCOUNTER — Telehealth: Payer: Self-pay

## 2020-06-02 DIAGNOSIS — I350 Nonrheumatic aortic (valve) stenosis: Secondary | ICD-10-CM

## 2020-06-02 NOTE — Telephone Encounter (Signed)
Noted  

## 2020-06-02 NOTE — Telephone Encounter (Signed)
Patient called he wanted me to let you guys know he is canceling his appointment due to having to get heart surgery he stated he will call back to reschedule. CB:[phone]

## 2020-06-03 ENCOUNTER — Ambulatory Visit: Payer: Medicare PPO | Admitting: Physician Assistant

## 2020-06-03 ENCOUNTER — Other Ambulatory Visit: Payer: Self-pay

## 2020-06-03 ENCOUNTER — Encounter: Payer: Self-pay | Admitting: Physician Assistant

## 2020-06-03 VITALS — BP 130/40 | HR 59 | Ht 73.0 in | Wt 195.0 lb

## 2020-06-03 DIAGNOSIS — I359 Nonrheumatic aortic valve disorder, unspecified: Secondary | ICD-10-CM

## 2020-06-03 NOTE — Patient Instructions (Signed)
Medication Instructions:  No changes *If you need a refill on your cardiac medications before your next appointment, please call your pharmacy*   Lab Work: none   Follow-Up: Will be arranged upon hospital discharge after surgery.

## 2020-06-05 ENCOUNTER — Ambulatory Visit (HOSPITAL_COMMUNITY)
Admission: RE | Admit: 2020-06-05 | Discharge: 2020-06-05 | Disposition: A | Discharge status: Home / Self Care | Payer: Medicare PPO | Source: Ambulatory Visit | Attending: Cardiovascular Disease | Admitting: Cardiovascular Disease

## 2020-06-05 ENCOUNTER — Encounter (HOSPITAL_COMMUNITY)
Admission: RE | Admit: 2020-06-05 | Discharge: 2020-06-05 | Disposition: A | Discharge status: Home / Self Care | Payer: Medicare PPO | Source: Ambulatory Visit | Attending: Cardiovascular Disease | Admitting: Cardiovascular Disease

## 2020-06-05 ENCOUNTER — Other Ambulatory Visit: Payer: Self-pay

## 2020-06-05 DIAGNOSIS — Z7982 Long term (current) use of aspirin: Secondary | ICD-10-CM | POA: Diagnosis not present

## 2020-06-05 DIAGNOSIS — E119 Type 2 diabetes mellitus without complications: Secondary | ICD-10-CM | POA: Insufficient documentation

## 2020-06-05 DIAGNOSIS — Z01818 Encounter for other preprocedural examination: Secondary | ICD-10-CM

## 2020-06-05 DIAGNOSIS — I5032 Chronic diastolic (congestive) heart failure: Secondary | ICD-10-CM | POA: Insufficient documentation

## 2020-06-05 DIAGNOSIS — Z79899 Other long term (current) drug therapy: Secondary | ICD-10-CM | POA: Diagnosis not present

## 2020-06-05 DIAGNOSIS — I350 Nonrheumatic aortic (valve) stenosis: Secondary | ICD-10-CM | POA: Diagnosis not present

## 2020-06-05 DIAGNOSIS — R011 Cardiac murmur, unspecified: Secondary | ICD-10-CM | POA: Insufficient documentation

## 2020-06-05 DIAGNOSIS — F17210 Nicotine dependence, cigarettes, uncomplicated: Secondary | ICD-10-CM | POA: Insufficient documentation

## 2020-06-05 DIAGNOSIS — F419 Anxiety disorder, unspecified: Secondary | ICD-10-CM | POA: Diagnosis not present

## 2020-06-05 DIAGNOSIS — E785 Hyperlipidemia, unspecified: Secondary | ICD-10-CM | POA: Diagnosis not present

## 2020-06-05 DIAGNOSIS — Z20822 Contact with and (suspected) exposure to covid-19: Secondary | ICD-10-CM | POA: Insufficient documentation

## 2020-06-05 DIAGNOSIS — R918 Other nonspecific abnormal finding of lung field: Secondary | ICD-10-CM | POA: Insufficient documentation

## 2020-06-05 DIAGNOSIS — I110 Hypertensive heart disease with heart failure: Secondary | ICD-10-CM | POA: Diagnosis not present

## 2020-06-05 DIAGNOSIS — R0600 Dyspnea, unspecified: Secondary | ICD-10-CM | POA: Diagnosis not present

## 2020-06-05 DIAGNOSIS — G4733 Obstructive sleep apnea (adult) (pediatric): Secondary | ICD-10-CM | POA: Insufficient documentation

## 2020-06-05 DIAGNOSIS — K219 Gastro-esophageal reflux disease without esophagitis: Secondary | ICD-10-CM | POA: Insufficient documentation

## 2020-06-05 LAB — URINALYSIS, ROUTINE W REFLEX MICROSCOPIC
Bilirubin Urine: NEGATIVE
Glucose, UA: NEGATIVE mg/dL
Hgb urine dipstick: NEGATIVE
Ketones, ur: NEGATIVE mg/dL
Leukocytes,Ua: NEGATIVE
Nitrite: NEGATIVE
Protein, ur: NEGATIVE mg/dL
Specific Gravity, Urine: 1.01 (ref 1.005–1.030)
pH: 5 (ref 5.0–8.0)

## 2020-06-05 LAB — COMPREHENSIVE METABOLIC PANEL
ALT: 13 U/L (ref 0–44)
AST: 18 U/L (ref 15–41)
Albumin: 3.1 g/dL — ABNORMAL LOW (ref 3.5–5.0)
Alkaline Phosphatase: 115 U/L (ref 38–126)
Anion gap: 12 (ref 5–15)
BUN: 18 mg/dL (ref 8–23)
CO2: 20 mmol/L — ABNORMAL LOW (ref 22–32)
Calcium: 9 mg/dL (ref 8.9–10.3)
Chloride: 100 mmol/L (ref 98–111)
Creatinine, Ser: 1.07 mg/dL (ref 0.61–1.24)
GFR, Estimated: >60 mL/min (ref >60)
Glucose, Bld: 73 mg/dL (ref 70–99)
Potassium: 4.8 mmol/L (ref 3.5–5.1)
Sodium: 132 mmol/L — ABNORMAL LOW (ref 135–145)
Total Bilirubin: 0.3 mg/dL (ref 0.3–1.2)
Total Protein: 6.1 g/dL — ABNORMAL LOW (ref 6.5–8.1)

## 2020-06-05 LAB — TYPE AND SCREEN
ABO/RH(D): O POS
Antibody Screen: NEGATIVE

## 2020-06-05 LAB — CBC
HCT: 34.3 % — ABNORMAL LOW (ref 39.0–52.0)
Hemoglobin: 10.5 g/dL — ABNORMAL LOW (ref 13.0–17.0)
MCH: 28.1 pg (ref 26.0–34.0)
MCHC: 30.6 g/dL (ref 30.0–36.0)
MCV: 91.7 fL (ref 80.0–100.0)
Platelets: 228 10*3/uL (ref 150–400)
RBC: 3.74 MIL/uL — ABNORMAL LOW (ref 4.22–5.81)
RDW: 15.9 % — ABNORMAL HIGH (ref 11.5–15.5)
WBC: 6.9 10*3/uL (ref 4.0–10.5)
nRBC: 0 % (ref 0.0–0.2)

## 2020-06-05 LAB — SURGICAL PCR SCREEN
MRSA, PCR: NEGATIVE
Staphylococcus aureus: NEGATIVE

## 2020-06-05 LAB — SARS CORONAVIRUS 2 (TAT 6-24 HRS): SARS Coronavirus 2: NEGATIVE

## 2020-06-05 LAB — PROTIME-INR
INR: 1 (ref 0.8–1.2)
Prothrombin Time: 12.9 seconds (ref 11.4–15.2)

## 2020-06-05 LAB — GLUCOSE, CAPILLARY: Glucose-Capillary: 90 mg/dL (ref 70–99)

## 2020-06-05 NOTE — Progress Notes (Signed)
PCP - Dr. [name] Cardiologist - Dr. Sherren Mocha  PPM/ICD - Denies  Chest x-ray - 06/05/20 EKG - 06/05/20 Stress Test - Denies ECHO - 03/09/20 Cardiac Cath - 03/06/20  Sleep Study - Yes CPAP - Yes, sometimes  Fasting Blood Sugar - 68-74 Checks Blood Sugar _1__ time a day  Blood Thinner Instructions: N/A Aspirin Instructions: Continue taking ASA. Do not take it the morning of surgery.  ERAS Protcol - No  COVID TEST- 06/05/20   Anesthesia review: Yes, cardiac hx  Patient denies shortness of breath, fever, cough and chest pain at PAT appointment   All instructions explained to the patient, with a verbal understanding of the material. Patient agrees to go over the instructions while at home for a better understanding. Patient also instructed to self quarantine after being tested for COVID-19. The opportunity to ask questions was provided.

## 2020-06-05 NOTE — Pre-Procedure Instructions (Signed)
Your procedure is scheduled on Tuesday, June 09, 2020.  Report to [facility] Main Entrance "A" at 5:30 A.M., and check in at the Admitting office.  Call this number if you have problems the morning of surgery:  [phone]  Call [phone] if you have any questions prior to your surgery date Monday-Friday 8am-4pm    Remember:  Do not eat or drink after midnight the night before your surgery    Continue taking all current medications without change through the day before surgery. On the morning of surgery do not take any medications.  As of today, STOP taking any Aleve, Naproxen, Ibuprofen, Motrin, Advil, Goody's, BC's, all herbal medications, fish oil, and all vitamins.                      Do not wear jewelry.            Do not wear lotions, powders, colognes, or deodorant.            Do not shave 48 hours prior to surgery.  Men may shave face and neck.            Do not bring valuables to the hospital.            [facility] is not responsible for any belongings or valuables.  Do NOT Smoke (Tobacco/Vaping) or drink Alcohol 24 hours prior to your procedure If you use a CPAP at night, you may bring all equipment for your overnight stay.   Contacts, glasses, dentures or bridgework may not be worn into surgery.      For patients admitted to the hospital, discharge time will be determined by your treatment team.   Patients discharged the day of surgery will not be allowed to drive home, and someone needs to stay with them for 24 hours.    Special instructions:   [place]- Preparing For Surgery  Before surgery, you can play an important role. Because skin is not sterile, your skin needs to be as free of germs as possible. You can reduce the number of germs on your skin by washing with CHG (chlorahexidine gluconate) Soap before surgery.  CHG is an antiseptic cleaner which kills germs and bonds with the skin to continue killing germs even after washing.    Oral Hygiene is  also important to reduce your risk of infection.  Remember - BRUSH YOUR TEETH THE MORNING OF SURGERY WITH YOUR REGULAR TOOTHPASTE  Please do not use if you have an allergy to CHG or antibacterial soaps. If your skin becomes reddened/irritated stop using the CHG.  Do not shave (including legs and underarms) for at least 48 hours prior to first CHG shower. It is OK to shave your face.  Please follow these instructions carefully.   1. Shower the NIGHT BEFORE SURGERY and the MORNING OF SURGERY with CHG Soap.   2. If you chose to wash your hair, wash your hair first as usual with your normal shampoo.  3. After you shampoo, rinse your hair and body thoroughly to remove the shampoo.  4. Use CHG as you would any other liquid soap. You can apply CHG directly to the skin and wash gently with a scrungie or a clean washcloth.   5. Apply the CHG Soap to your body ONLY FROM THE NECK DOWN.  Do not use on open wounds or open sores. Avoid contact with your eyes, ears, mouth and genitals (private parts). Wash Face and genitals (private parts)  with your normal soap.   6. Wash thoroughly, paying special attention to the area where your surgery will be performed.  7. Thoroughly rinse your body with warm water from the neck down.  8. DO NOT shower/wash with your normal soap after using and rinsing off the CHG Soap.  9. Pat yourself dry with a CLEAN TOWEL.  10. Wear CLEAN PAJAMAS to bed the night before surgery  11. Place CLEAN SHEETS on your bed the night of your first shower and DO NOT SLEEP WITH PETS.   Day of Surgery: Wear Clean/Comfortable clothing the morning of surgery Do not apply any deodorants/lotions.   Remember to brush your teeth WITH YOUR REGULAR TOOTHPASTE.   Please read over the following fact sheets that you were given.

## 2020-06-05 NOTE — Pre-Procedure Instructions (Addendum)
ELO MARMOLEJOS  06/05/2020    Your procedure is scheduled on Tuesday, June 09, 2020 at 7:30 AM.   Report to [facility] Entrance "A" Admitting Office at 5:30 AM.  Call this number if you have problems the morning of surgery: [phone]  Questions prior to day of surgery, please call [phone] between 8 & 4 PM.   Remember:  Do not eat or drink after midnight Monday, 06/08/20  Take these medicines the morning of surgery with A SIP OF WATER: NONE  Continue Aspirin as instructed. Stop Vitamins as of today. Do not use NSAIDS (Ibuprofen, Aleve, etc), Herbal medications, other Aspirin containing products and Fish oil prior to surgery.  Do NOT smoke 24 hours prior to surgery.    Do not wear jewelry.  Do not wear lotions, powders, colgone or deodorant.  Men may shave face and neck.  Do not bring valuables to the hospital.  [facility] is not responsible for any belongings or valuables.  Contacts, dentures or bridgework may not be worn into surgery.  Leave your suitcase in the car.  After surgery it may be brought to your room.  For patients admitted to the hospital, discharge time will be determined by your treatment team.  [facility] - Preparing for Surgery  Before surgery, you can play an important role.  Because skin is not sterile, your skin needs to be as free of germs as possible.  You can reduce the number of germs on you skin by washing with CHG (chlorahexidine gluconate) soap before surgery.  CHG is an antiseptic cleaner which kills germs and bonds with the skin to continue killing germs even after washing.  Oral Hygiene is also important in reducing the risk of infection.  Remember to brush your teeth with your regular toothpaste the morning of surgery.  Please DO NOT use if you have an allergy to CHG or antibacterial soaps.  If your skin becomes reddened/irritated stop using the CHG and inform your nurse when you arrive at Short Stay.  Do not shave (including  legs and underarms) for at least 48 hours prior to the first CHG shower.  You may shave your face.  Please follow these instructions carefully:   1.  Shower with CHG Soap the night before surgery and the morning of Surgery.  2.  If you choose to wash your hair, wash your hair first as usual with your normal shampoo.  3.  After you shampoo, rinse your hair and body thoroughly to remove the shampoo. 4.  Use CHG as you would any other liquid soap.  You can apply chg directly to the skin and wash gently with a      scrungie or washcloth.           5.  Apply the CHG Soap to your body ONLY FROM THE NECK DOWN.   Do not use on open wounds or open sores. Avoid contact with your eyes, ears, mouth and genitals (private parts).  Wash genitals (private parts) with your normal soap - do this prior to using CHG soap.  6.  Wash thoroughly, paying special attention to the area where your surgery will be performed.  7.  Thoroughly rinse your body with warm water from the neck down.  8.  DO NOT shower/wash with your normal soap after using and rinsing off the CHG Soap.  9.  Pat yourself dry with a clean towel.            10.  Wear clean pajamas.            11.  Place clean sheets on your bed the night of your first shower and do not sleep with pets.  Day of Surgery  Shower as above. Do not apply any lotions/deodorants the morning of surgery.   Please wear clean clothes to the hospital. Remember to brush your teeth with toothpaste.  Please read over the fact sheets that you were given.

## 2020-06-08 ENCOUNTER — Inpatient Hospital Stay (HOSPITAL_COMMUNITY): Payer: Medicare PPO | Admitting: Vascular Surgery

## 2020-06-08 ENCOUNTER — Inpatient Hospital Stay (HOSPITAL_COMMUNITY): Payer: Medicare PPO | Admitting: Anesthesiology

## 2020-06-08 MED ORDER — SODIUM CHLORIDE 0.9 % IV SOLN
1.5000 g | INTRAVENOUS | Status: DC
  Filled 2020-06-08: qty 1.5

## 2020-06-08 MED ORDER — NOREPINEPHRINE 4 MG/250ML-% IV SOLN
0.0000 ug/min | INTRAVENOUS | Status: DC
  Filled 2020-06-08: qty 250

## 2020-06-08 MED ORDER — POTASSIUM CHLORIDE 2 MEQ/ML IV SOLN
80.0000 meq | INTRAVENOUS | Status: DC
  Filled 2020-06-08: qty 40

## 2020-06-08 MED ORDER — DEXMEDETOMIDINE HCL IN NACL 400 MCG/100ML IV SOLN
0.1000 ug/kg/h | INTRAVENOUS | Status: DC
  Filled 2020-06-08: qty 100

## 2020-06-08 MED ORDER — VANCOMYCIN HCL 1500 MG/300ML IV SOLN
1500.0000 mg | INTRAVENOUS | Status: DC
  Filled 2020-06-08: qty 300

## 2020-06-08 MED ORDER — MAGNESIUM SULFATE 50 % IJ SOLN
40.0000 meq | INTRAMUSCULAR | Status: DC
  Filled 2020-06-08: qty 9.85

## 2020-06-08 MED ORDER — SODIUM CHLORIDE 0.9 % IV SOLN
INTRAVENOUS | Status: DC
  Filled 2020-06-08: qty 30

## 2020-06-08 NOTE — Anesthesia Preprocedure Evaluation (Deleted)
Anesthesia Evaluation  Patient identified by MRN, date of birth, ID band Patient awake    Reviewed: Allergy & Precautions, NPO status , Patient's Chart, lab work & pertinent test results, reviewed documented beta blocker date and time   History of Anesthesia Complications Negative for: history of anesthetic complications  Airway Mallampati: II  TM Distance: >3 FB Neck ROM: Full    Dental  (+) Edentulous Lower, Edentulous Upper   Pulmonary sleep apnea (no cpap ) , COPD,  COPD inhaler, Current SmokerPatient did not abstain from smoking.,    Pulmonary exam normal        Cardiovascular hypertension, Pt. on medications and Pt. on home beta blockers + CAD, + Peripheral Vascular Disease and +CHF  Normal cardiovascular exam+ Valvular Problems/Murmurs AS and AI  + Systolic murmurs and + Diastolic murmurs  '21 TEE - EF 60 to 65%. Left atrial size was mildly dilated. Trivial MR. Aortic valve regurgitation is severe. Moderate to severe aortic valve stenosis. Aortic valve mean gradient measures 43.0 mmHg.   '21 Carotid US - near normal carotids  '21 Cath - 1.  Nonobstructive coronary artery disease with mild nonobstructive plaquing in the RCA, wide patency of the left main, wide patency of the LAD, and mild to moderate mid circumflex stenosis 2.  Moderate aortic stenosis with mean gradient 26 mmHg and calculated aortic valve area 1.6 cm. 3.  Moderate pulmonary hypertension with mean PA pressure 34 mmHg, transpulmonary gradient 13 mmHg, PVR less than 2 Wood units    Neuro/Psych  Headaches, PSYCHIATRIC DISORDERS Anxiety Depression    GI/Hepatic Neg liver ROS, GERD  Medicated and Controlled,  Endo/Other  diabetes (no longer on meds), Type 2 Na 132   Renal/GU negative Renal ROS     Musculoskeletal  (+) Arthritis ,   Abdominal   Peds  Hematology  (+) anemia ,   Anesthesia Other Findings Covid test negative    Reproductive/Obstetrics                           Anesthesia Physical Anesthesia Plan  ASA: IV  Anesthesia Plan: MAC   Post-op Pain Management:    Induction: Intravenous  PONV Risk Score and Plan: 1 and Propofol infusion and Treatment may vary due to age or medical condition  Airway Management Planned: Natural Airway and Simple Face Mask  Additional Equipment: Arterial line  Intra-op Plan:   Post-operative Plan:   Informed Consent: I have reviewed the patients History and Physical, chart, labs and discussed the procedure including the risks, benefits and alternatives for the proposed anesthesia with the patient or authorized representative who has indicated his/her understanding and acceptance.       Plan Discussed with: CRNA and Anesthesiologist  Anesthesia Plan Comments:       Anesthesia Quick Evaluation

## 2020-06-08 NOTE — Progress Notes (Signed)
Anesthesia Chart Review:  Case: [id number] Date/Time: 06/09/20 0715   Procedures:      TRANSCATHETER AORTIC VALVE REPLACEMENT, TRANSFEMORAL (N/A Chest)     TRANSESOPHAGEAL ECHOCARDIOGRAM (TEE) (N/A )   Anesthesia type: General   Pre-op diagnosis: Severe Aortic Stenosis   Location: [facility] OR ROOM 16 / [place] OR   Surgeons: Sherren Mocha, MD    CT Surgeon: Gilford Raid, MD  DISCUSSION: Patient is a 69 year old male scheduled for the above procedure. S/p multiple teeth extractions 05/22/20.  History includes smoking, COPD, HTN, HLD, OSA (CPAP), chronic diastolic CHF, murmur/aortic stenosis, nonobstructive CAD (02/2020), venous insufficiency, DM2, anxiety, depression, GERD, migraines, dyspnea.  Labs could not run the ABG due to tube delay, so will need to be done on the day of surgery. Last A1c seen was 5.3% on 03/05/20.  06/05/2020 presurgical COVID-19 test negative.  Anesthesia team to evaluate on the day of surgery.  VS: BP (!) 130/44   Pulse (!) 56   Temp 36.6 C (Oral)   Resp 19   Ht 6\' 1"  (1.854 m)   Wt 86.2 kg   SpO2 98%   BMI 25.07 kg/m     PROVIDERS: [name], MD is PCP  Candee Furbish, MD is cardiologist Sherren Mocha, MD is structural heart cardiologist   LABS: Labs reviewed: Acceptable for surgery. (all labs ordered are listed, but only abnormal results are displayed)  Labs Reviewed  CBC - Abnormal; Notable for the following components:      Result Value   RBC 3.74 (*)    Hemoglobin 10.5 (*)    HCT 34.3 (*)    RDW 15.9 (*)    All other components within normal limits  COMPREHENSIVE METABOLIC PANEL - Abnormal; Notable for the following components:   Sodium 132 (*)    CO2 20 (*)    Total Protein 6.1 (*)    Albumin 3.1 (*)    All other components within normal limits  SURGICAL PCR SCREEN  SARS CORONAVIRUS 2 (TAT 6-24 HRS)  PROTIME-INR  URINALYSIS, ROUTINE W REFLEX MICROSCOPIC  GLUCOSE, CAPILLARY  TYPE AND SCREEN    PFT 03/10/20: FVC-%Pred-Pre Latest Units:  % 49  FEV1-%Pred-Pre Latest Units: % 33  FEV1FVC-%Pred-Pre Latest Units: % 67  TLC % pred Latest Units: % 78  RV % pred Latest Units: % 137  DLCO unc % pred Latest Units: % 41     IMAGES: CXR 06/05/20: IMPRESSION: Hyperexpansion of the lungs. Small right pleural effusion may be present.   EKG: 06/05/20: SB at 54 bpm   CV: TEE 03/09/20: IMPRESSIONS  1. Moderately calcified and thickened aortic valve with moderately  restricted leaflet openings, however transaortic gradients in the severe  range (mean gradient 43 mmHg) secondary to high flow sec to severe aortic  regurgitation.  2. Left ventricular ejection fraction, by estimation, is 60 to 65%. The  left ventricle has normal function. The left ventricle has no regional  wall motion abnormalities.  3. Right ventricular systolic function is normal. The right ventricular  size is normal.  4. Left atrial size was mildly dilated. No left atrial/left atrial  appendage thrombus was detected.  5. The mitral valve is normal in structure. Trivial mitral valve  regurgitation. No evidence of mitral stenosis.  6. The aortic valve is normal in structure. Aortic valve regurgitation is  severe. Moderate to severe aortic valve stenosis. Aortic valve mean  gradient measures 43.0 mmHg.  7. The inferior vena cava is normal in size with greater than 50%  respiratory variability, suggesting right atrial pressure of 3 mmHg.    CT Coronary 03/07/20: IMPRESSION: 1. Trileaflet aortic valve with severely calcified leaflets with moderately restricted leaflet opening and no calcifications extending into the LVOT. Aortic valve calcium score 2271 consistent with severe aortic stenosis. Annular measurements suitable for delivery of 26 mm Edwards-SAPIEN 3 Ultra valve. 2. Sufficient coronary to annulus distance. 3. Optimum Fluoroscopic Angle for Delivery: RAO 3 CRA 4. 4. No thrombus in the left atrial appendage.   RHC/LHC 03/06/20: 1.   Nonobstructive coronary artery disease with mild nonobstructive plaquing in the RCA, wide patency of the left main, wide patency of the LAD, and mild to moderate mid circumflex stenosis [50% Prox to Mid Cx; 30% Mid RCA; 30% Dist RCA] 2.  Moderate aortic stenosis with mean gradient 26 mmHg and calculated aortic valve area 1.6 cm. 3.  Moderate pulmonary hypertension with mean PA pressure 34 mmHg, transpulmonary gradient 13 mmHg, PVR less than 2 Wood units   Carotid US 03/07/20: Summary:  - Right Carotid: The extracranial vessels were near-normal with only minimal wall thickening or plaque.  - Left Carotid: The extracranial vessels were near-normal with only minimal wall thickening or plaque.  - Vertebrals: Bilateral vertebral arteries demonstrate antegrade flow.  - Subclavians: Normal flow hemodynamics were seen in bilateral subclavian arteries.    Past Medical History:  Diagnosis Date  . Anxiety   . Aortic valve disease    Severe aortic insufficiency, moderate to severe aortic stenosis  . Arthritis    knee and neck  . CAD (coronary artery disease)    cath 9/21: LAD calcified, pLCx 63, mRCA 30, dRCA 30  . Chronic diastolic CHF   . COPD (chronic obstructive pulmonary disease) ([place])   . Depression   . Diabetes mellitus without complication ([place])    Type II - not on medication "I lost about 160 lbs"   . Dyspnea   . GERD (gastroesophageal reflux disease)   . Heart murmur   . Hyperlipidemia   . Hypertension   . Migraine   . Obesity   . Sleep apnea    CPAP   . Venous insufficiency    managed by ortho (Dr. Sharol Given)    Past Surgical History:  Procedure Laterality Date  . ANKLE SURGERY Right    Dr. Marcelino Scot, has pins  . COLONOSCOPY W/ POLYPECTOMY    . KNEE ARTHROSCOPY Right    Dr. Noemi Chapel  . MULTIPLE EXTRACTIONS WITH ALVEOLOPLASTY Bilateral 05/22/2020   Procedure: MULTIPLE EXTRACTION WITH ALVEOLOPLASTY;  Surgeon: Diona Browner, DMD;  Location: [place];  Service: Oral Surgery;  Laterality:  Bilateral;  . right foot surgery,rt great toe callus removal    . RIGHT/LEFT HEART CATH AND CORONARY ANGIOGRAPHY N/A 03/06/2020   Procedure: RIGHT/LEFT HEART CATH AND CORONARY ANGIOGRAPHY;  Surgeon: Sherren Mocha, MD;  Location: [place] [facility];  Service: Cardiovascular;  Laterality: N/A;  . TEE WITHOUT CARDIOVERSION N/A 03/09/2020   Procedure: TRANSESOPHAGEAL ECHOCARDIOGRAM (TEE);  Surgeon: Dorothy Spark, MD;  Location: [facility] ENDOSCOPY;  Service: Cardiovascular;  Laterality: N/A;  . TONSILLECTOMY AND ADENOIDECTOMY     age 37    MEDICATIONS: . ascorbic acid (VITAMIN C) 500 MG tablet  . aspirin 81 MG tablet  . atenolol (TENORMIN) 25 MG tablet  . atorvastatin (LIPITOR) 80 MG tablet  . budesonide-formoterol (SYMBICORT) 160-4.5 MCG/ACT inhaler  . butalbital-acetaminophen-caffeine (FIORICET, ESGIC) 50-325-40 MG per tablet  . folic acid (FOLVITE) 1 MG tablet  . furosemide (LASIX) 40 MG tablet  .  hydrALAZINE (APRESOLINE) 50 MG tablet  . ipratropium-albuterol (DUONEB) 0.5-2.5 (3) MG/3ML SOLN  . LORazepam (ATIVAN) 1 MG tablet  . montelukast (SINGULAIR) 10 MG tablet  . Multiple Vitamin (MULTIVITAMIN WITH MINERALS) TABS  . omeprazole (PRILOSEC) 20 MG capsule  . oxyCODONE-acetaminophen (PERCOCET) 5-325 MG tablet  . promethazine (PHENERGAN) 25 MG tablet  . venlafaxine XR (EFFEXOR-XR) 75 MG 24 hr capsule   No current facility-administered medications for this encounter.    Myra Gianotti, PA-C Surgical Short Stay/Anesthesiology [facility] Phone [phone] [facility] Phone [phone] 06/08/2020 10:27 AM

## 2020-06-09 ENCOUNTER — Other Ambulatory Visit: Payer: Self-pay

## 2020-06-09 ENCOUNTER — Ambulatory Visit (HOSPITAL_COMMUNITY)
Admission: RE | Admit: 2020-06-09 | Discharge: 2020-06-09 | Disposition: A | Discharge status: Home / Self Care | Payer: Medicare PPO | Source: Ambulatory Visit | Attending: Cardiovascular Disease | Admitting: Cardiovascular Disease

## 2020-06-09 ENCOUNTER — Encounter (HOSPITAL_COMMUNITY): Payer: Self-pay | Admitting: Cardiovascular Disease

## 2020-06-09 ENCOUNTER — Inpatient Hospital Stay (HOSPITAL_COMMUNITY): Payer: Medicare PPO

## 2020-06-09 ENCOUNTER — Telehealth: Payer: Self-pay

## 2020-06-09 ENCOUNTER — Encounter (HOSPITAL_COMMUNITY)
Admission: RE | Disposition: A | Discharge status: Home / Self Care | Payer: Medicare PPO | Source: Ambulatory Visit | Attending: Cardiovascular Disease

## 2020-06-09 DIAGNOSIS — B372 Candidiasis of skin and nail: Secondary | ICD-10-CM | POA: Insufficient documentation

## 2020-06-09 DIAGNOSIS — I350 Nonrheumatic aortic (valve) stenosis: Secondary | ICD-10-CM

## 2020-06-09 DIAGNOSIS — Z538 Procedure and treatment not carried out for other reasons: Secondary | ICD-10-CM | POA: Diagnosis not present

## 2020-06-09 LAB — GLUCOSE, CAPILLARY: Glucose-Capillary: 87 mg/dL (ref 70–99)

## 2020-06-09 SURGERY — IMPLANTATION, AORTIC VALVE, TRANSCATHETER, FEMORAL APPROACH
Anesthesia: Monitor Anesthesia Care | Site: Chest

## 2020-06-09 MED ORDER — SODIUM CHLORIDE 0.9 % IV SOLN
INTRAVENOUS | Status: AC
  Filled 2020-06-09 (×3): qty 1.2

## 2020-06-09 MED ORDER — CHLORHEXIDINE GLUCONATE 0.12 % MT SOLN: 15.0000 mL | Freq: Once | OROMUCOSAL | Status: DC

## 2020-06-09 MED ORDER — PROPOFOL 10 MG/ML IV BOLUS
INTRAVENOUS | Status: AC
  Filled 2020-06-09: qty 20

## 2020-06-09 MED ORDER — FENTANYL CITRATE (PF) 250 MCG/5ML IJ SOLN
INTRAMUSCULAR | Status: AC
  Filled 2020-06-09: qty 5

## 2020-06-09 MED ORDER — SODIUM CHLORIDE 0.9 % IV SOLN: INTRAVENOUS | Status: DC

## 2020-06-09 MED ORDER — FLUCONAZOLE 100 MG PO TABS: 100.0000 mg | ORAL_TABLET | Freq: Every day | ORAL | 0 refills | Status: DC

## 2020-06-09 MED ORDER — CHLORHEXIDINE GLUCONATE 4 % EX LIQD: 60.0000 mL | Freq: Once | CUTANEOUS | Status: DC

## 2020-06-09 MED ORDER — CHLORHEXIDINE GLUCONATE 4 % EX LIQD: 30.0000 mL | CUTANEOUS | Status: DC

## 2020-06-09 MED ORDER — CHLORHEXIDINE GLUCONATE 0.12 % MT SOLN
15.0000 mL | Freq: Once | OROMUCOSAL | Status: AC
  Administered 2020-06-09: 06:00:00 15 mL via OROMUCOSAL
  Filled 2020-06-09: qty 15

## 2020-06-09 MED ORDER — MIDAZOLAM HCL 2 MG/2ML IJ SOLN
INTRAMUSCULAR | Status: AC
  Filled 2020-06-09: qty 2

## 2020-06-09 MED ORDER — LIDOCAINE HCL 1 % IJ SOLN
INTRAMUSCULAR | Status: AC
  Filled 2020-06-09: qty 20

## 2020-06-09 MED ORDER — NYSTATIN 100000 UNIT/GM EX CREA: 1.0000 "application " | TOPICAL_CREAM | Freq: Two times a day (BID) | CUTANEOUS | 0 refills | Status: DC

## 2020-06-09 MED ORDER — ORAL CARE MOUTH RINSE: 15.0000 mL | Freq: Once | OROMUCOSAL | Status: AC

## 2020-06-09 NOTE — Telephone Encounter (Signed)
  [facility]   MULTIDISCIPLINARY HEART VALVE TEAM  12/21 Transfemoral TAVR cancelled due to bilateral candida groin infection and early skin breakdown. The pt has been prescribed Diflucan 100mg  take one tablet by mouth daily for 2 weeks and Mycostatin cream to use twice a day. The pt will have a follow-up appointment with Nell Range PA-C on 06/24/20 to assess groin sites and we will tentatively plan for TAVR on 06/30/20.  The pt will contact the office if he has any questions about medications or he notices worsening in symptoms.  Pt verbalized understanding of instructions and plan.

## 2020-06-09 NOTE — Progress Notes (Signed)
TCTS:  Jordan [name] has significant candida groin infection bilaterally with some early skin breakdown. He says this has been present for about a week. He has not had any fever or chills. Has noted some skin burning there but did not think much about it. Transfemoral TAVR will have to be cancelled today. Will send in prescription for Diflucan and Mycostatin cream. Instructed to keep area dry and open to air so skin is not on skin. Will see back in office in a week or two for followup of this and plan TAVR in early Jan. He has been stable so I think he can wait a couple more weeks to clear this up.

## 2020-06-09 NOTE — Progress Notes (Signed)
Pt noted to have chaffing, redness and open areas in the groin area bilaterally and at the top of the groin. Clipping not completed d/t the severity of the breakdown. Dr. Burt Knack made aware and stated that he will evaluate upon arrival.  Jacqlyn Larsen, RN

## 2020-06-09 NOTE — Progress Notes (Signed)
Dr. Cyndia Bent assessed pt's skin breakdown and has now cancelled procedure. Anesthesia and OR desk made aware.   Jacqlyn Larsen, RN

## 2020-06-10 ENCOUNTER — Ambulatory Visit: Payer: Medicare PPO | Admitting: Physician Assistant

## 2020-06-11 ENCOUNTER — Other Ambulatory Visit: Payer: Self-pay

## 2020-06-11 DIAGNOSIS — I350 Nonrheumatic aortic (valve) stenosis: Secondary | ICD-10-CM

## 2020-06-23 ENCOUNTER — Ambulatory Visit: Payer: Medicare PPO | Admitting: Cardiology

## 2020-06-24 ENCOUNTER — Other Ambulatory Visit: Payer: Self-pay

## 2020-06-24 ENCOUNTER — Other Ambulatory Visit: Payer: Self-pay | Admitting: Physician Assistant

## 2020-06-24 ENCOUNTER — Encounter: Payer: Self-pay | Admitting: Physician Assistant

## 2020-06-24 ENCOUNTER — Ambulatory Visit: Payer: Medicare PPO | Admitting: Physician Assistant

## 2020-06-24 VITALS — BP 142/68 | HR 67 | Ht 73.0 in | Wt 192.4 lb

## 2020-06-24 DIAGNOSIS — I350 Nonrheumatic aortic (valve) stenosis: Secondary | ICD-10-CM

## 2020-06-24 DIAGNOSIS — B372 Candidiasis of skin and nail: Secondary | ICD-10-CM | POA: Diagnosis not present

## 2020-06-24 MED ORDER — NYSTATIN 100000 UNIT/GM EX CREA: 1.0000 "application " | TOPICAL_CREAM | Freq: Two times a day (BID) | CUTANEOUS | 12 refills | Status: DC

## 2020-06-24 NOTE — Patient Instructions (Signed)
Medication Instructions:  No changes *If you need a refill on your cardiac medications before your next appointment, please call your pharmacy*   Lab Work: None  If you have labs (blood work) drawn today and your tests are completely normal, you will receive your results only by: Marland Kitchen MyChart Message (if you have MyChart) OR . A paper copy in the mail If you have any lab test that is abnormal or we need to change your treatment, we will call you to review the results.   Testing/Procedures: none   Follow-Up: As planned.   Other Instructions You were given a letter of instructions planning Pre-Admission Testing and your valve procedure.  Please let us know if you have any questions.

## 2020-06-24 NOTE — Progress Notes (Signed)
[facility] [name]                                     Cardiology Office Note:    Date:  06/24/2020   ID:  Jordan [name], DOB 1950-08-16, MRN [id number]  PCP:  [name], MD  [facility] Cardiologist:  Candee Furbish, MD  [facility] Electrophysiologist:  None   Referring MD: [name], MD   Severe AS, groin yeast infection- groin check prior to r/s TAVR  History of Present Illness:    Jordan [name] is a 70 y.o. male with a hx of DMT2, HTN, HLD, OSA on CPAP, chronic venous insufficiency with bilateral lower extremity edema and history of venous stasis ulcers, ongoing tobacco abuse with COPD, arthritis and severe AS with plans for TAVR who presents to clinic for follow up. Pt was scheduled for transfemoral TAVR on 06/09/20 but this was cancelled due to bilateral candida groin infection and early skin breakdown. He was prescribed Diflucan 100mg  take one tablet by mouth daily for 2 weeks and Mycostatin cream to use twice a day.  Today he presents to clinic for follow up and groin check. Groins are feeling much better. Rash and skin breakdown fully healed. No change in other symptoms.  Past Medical History:  Diagnosis Date  . Anxiety   . Aortic valve disease    Severe aortic insufficiency, moderate to severe aortic stenosis  . Arthritis    knee and neck  . CAD (coronary artery disease)    cath 9/21: LAD calcified, pLCx 9, mRCA 30, dRCA 30  . Chronic diastolic CHF   . COPD (chronic obstructive pulmonary disease) ([place])   . Depression   . Diabetes mellitus without complication ([place])    Type II - not on medication "I lost about 160 lbs"   . Dyspnea   . GERD (gastroesophageal reflux disease)   . Heart murmur   . Hyperlipidemia   . Hypertension   . Migraine   . Obesity   . Sleep apnea    CPAP   . Venous insufficiency    managed by ortho (Dr. Sharol Given)    Past Surgical History:  Procedure Laterality Date  . ANKLE  SURGERY Right    Dr. Marcelino Scot, has pins  . COLONOSCOPY W/ POLYPECTOMY    . KNEE ARTHROSCOPY Right    Dr. Noemi Chapel  . MULTIPLE EXTRACTIONS WITH ALVEOLOPLASTY Bilateral 05/22/2020   Procedure: MULTIPLE EXTRACTION WITH ALVEOLOPLASTY;  Surgeon: Diona Browner, DMD;  Location: [facility];  Service: Oral Surgery;  Laterality: Bilateral;  . right foot surgery,rt great toe callus removal    . RIGHT/LEFT HEART CATH AND CORONARY ANGIOGRAPHY N/A 03/06/2020   Procedure: RIGHT/LEFT HEART CATH AND CORONARY ANGIOGRAPHY;  Surgeon: Sherren Mocha, MD;  Location: [facility];  Service: Cardiovascular;  Laterality: N/A;  . TEE WITHOUT CARDIOVERSION N/A 03/09/2020   Procedure: TRANSESOPHAGEAL ECHOCARDIOGRAM (TEE);  Surgeon: Dorothy Spark, MD;  Location: [facility] END[facility]COPY;  Service: Cardiovascular;  Laterality: N/A;  . TONSILLECTOMY AND ADENOIDECTOMY     age 51    Current Medications: Current Meds  Medication Sig  . ascorbic acid (VITAMIN C) 500 MG tablet Take 500 mg by mouth daily.  Marland Kitchen aspirin 81 MG tablet Take 81 mg by mouth daily.  Marland Kitchen atenolol (TENORMIN) 25 MG tablet Take 1 tablet (25 mg total) by mouth daily.  Marland Kitchen  atorvastatin (LIPITOR) 80 MG tablet Take 80 mg by mouth daily after supper.   . budesonide-formoterol (SYMBICORT) 160-4.5 MCG/ACT inhaler Inhale 2 puffs into the lungs in the morning and at bedtime.  . butalbital-acetaminophen-caffeine (FIORICET, ESGIC) 50-325-40 MG per tablet Take 1 tablet by mouth 2 (two) times daily as needed for migraine.   . fluconazole (DIFLUCAN) 100 MG tablet Take 1 tablet (100 mg total) by mouth daily.  . folic acid (FOLVITE) 1 MG tablet Take 1 tablet (1 mg total) by mouth daily.  . furosemide (LASIX) 40 MG tablet Take 1 tablet (40 mg total) by mouth daily.  . hydrALAZINE (APRESOLINE) 50 MG tablet Take 1 tablet (50 mg total) by mouth every 8 (eight) hours.  Marland Kitchen ipratropium-albuterol (DUONEB) 0.5-2.5 (3) MG/3ML SOLN Take 3 mLs by nebulization every 6 (six) hours as needed (shortness  of breath/wheezing).  . LORazepam (ATIVAN) 1 MG tablet Take 0.5 tablets (0.5 mg total) by mouth 2 (two) times daily as needed for anxiety.  . methocarbamol (ROBAXIN) 750 MG tablet Take 750 mg by mouth as needed.  . montelukast (SINGULAIR) 10 MG tablet Take 10 mg by mouth daily.  . Multiple Vitamin (MULTIVITAMIN WITH MINERALS) TABS Take 1 tablet by mouth daily.  Marland Kitchen omeprazole (PRILOSEC) 20 MG capsule Take 20 mg by mouth daily.  Marland Kitchen oxyCODONE-acetaminophen (PERCOCET) 5-325 MG tablet Take 1 tablet by mouth every 4 (four) hours as needed.  . promethazine (PHENERGAN) 25 MG tablet Take 25 mg by mouth every 8 (eight) hours as needed for nausea/vomiting.  . venlafaxine XR (EFFEXOR-XR) 75 MG 24 hr capsule Take 75 mg by mouth in the morning and at bedtime. MUST HAVE BRAND NAME ONLY  . [DISCONTINUED] nystatin cream (MYCOSTATIN) Apply 1 application topically 2 (two) times daily.     Allergies:   Altace [ramipril], Codeine, and Naproxen   Social History   Socioeconomic History  . Marital status: Single    Spouse name: Not on file  . Number of children: 0  . Years of education: Not on file  . Highest education level: Not on file  Occupational History  . Occupation: retired [employer]  Tobacco Use  . Smoking status: Current Some Day Smoker    Packs/day: 0.75    Years: 55.00    Pack years: 41.25    Types: Cigarettes  . Smokeless tobacco: Never Used  Vaping Use  . Vaping Use: Never used  Substance and Sexual Activity  . Alcohol use: No    Alcohol/week: 0.0 standard drinks  . Drug use: No  . Sexual activity: Yes    Birth control/protection: Condom  Other Topics Concern  . Not on file  Social History Narrative  . Not on file   Social Determinants of Health   Financial Resource Strain: Not on file  Food Insecurity: Not on file  Transportation Needs: Not on file  Physical Activity: Not on file  Stress: Not on file  Social Connections: Not on file      Family History: The patient'family history includes Alzheimer's disease in his mother; Congestive Heart Failure in his father; Diabetes in an other family member; Heart attack in his father; Hypertension in his mother; Stroke in his father. There is no history of Colon cancer, Rectal cancer, or Esophageal cancer.  ROS:   Please see the history of present illness.    All other systems reviewed and are negative.  EKGs/Labs/Other Studies Reviewed:    The following studies were reviewed today: none  EKG:  EKG is NOT ordered today.  Recent Labs: 03/03/2020: B Natriuretic Peptide 2,454.0 03/12/2020: Magnesium 1.8 06/05/2020: ALT 13; BUN 18; Creatinine, Ser 1.07; Hemoglobin 10.5; Platelets 228; Potassium 4.8; Sodium 132  Recent Lipid Panel    Component Value Date/Time   CHOL 108 03/06/2020 0508   TRIG 68 03/06/2020 0508   HDL 30 (L) 03/06/2020 0508   CHOLHDL 3.6 03/06/2020 0508   VLDL 14 03/06/2020 0508   LDLCALC 64 03/06/2020 0508     Risk Assessment/Calculations:       Physical Exam:    VS:  BP (!) 142/68   Pulse 67   Ht 6\' 1"  (1.854 m)   Wt 192 lb 6.4 oz (87.3 kg)   SpO2 98%   BMI 25.38 kg/m     Wt Readings from Last 3 Encounters:  06/24/20 192 lb 6.4 oz (87.3 kg)  06/09/20 190 lb 0.6 oz (86.2 kg)  06/05/20 190 lb (86.2 kg)     GEN:  Well nourished, well developed in no acute distress, appears older than stated age and frail HEENT: Normal NECK: No JVD; No carotid bruits LYMPHATICS: No lymphadenopathy CARDIAC: RRR, no murmurs, rubs, gallops. Chronic LE edema with chronic venous stasis  RESPIRATORY:  Clear to auscultation without rales, wheezing or rhonchi  ABDOMEN: Soft, non-tender, non-distended MUSCULOSKELETAL:  No edema; No deformity  SKIN: Warm and dry. Groin sites clear with no skin breakdown NEUROLOGIC:  Alert and oriented x 3 PSYCHIATRIC:  Normal affect   ASSESSMENT:    1. Severe aortic stenosis   2. Candidal skin infection    PLAN:    In order  of problems listed above:  1. Plan for TAVR next week. Instructions given to patient  2. Groin sites are now cleared. He is cleared for TF TAVR next week.       Medication Adjustments/Labs and Tests Ordered: Current medicines are reviewed at length with the patient today.  Concerns regarding medicines are outlined above.  No orders of the defined types were placed in this encounter.  No orders of the defined types were placed in this encounter.   Patient Instructions  Medication Instructions:  No changes *If you need a refill on your cardiac medications before your next appointment, please call your pharmacy*   Lab Work: None  If you have labs (blood work) drawn today and your tests are completely normal, you will receive your results only by: Marland Kitchen MyChart Message (if you have MyChart) OR . A paper copy in the mail If you have any lab test that is abnormal or we need to change your treatment, we will call you to review the results.   Testing/Procedures: none   Follow-Up: As planned.   Other Instructions You were given a letter of instructions planning Pre-Admission Testing and your valve procedure.  Please let us know if you have any questions.     Signed, Angelena Form, PA-C  06/24/2020 9:22 PM    [facility]

## 2020-06-26 ENCOUNTER — Encounter (HOSPITAL_COMMUNITY): Payer: Self-pay

## 2020-06-26 ENCOUNTER — Other Ambulatory Visit: Payer: Self-pay

## 2020-06-26 ENCOUNTER — Encounter (HOSPITAL_COMMUNITY)
Admission: RE | Admit: 2020-06-26 | Discharge: 2020-06-26 | Disposition: A | Discharge status: Home / Self Care | Payer: Medicare PPO | Source: Ambulatory Visit | Attending: Cardiovascular Disease | Admitting: Cardiovascular Disease

## 2020-06-26 DIAGNOSIS — F419 Anxiety disorder, unspecified: Secondary | ICD-10-CM | POA: Insufficient documentation

## 2020-06-26 DIAGNOSIS — Z9989 Dependence on other enabling machines and devices: Secondary | ICD-10-CM | POA: Insufficient documentation

## 2020-06-26 DIAGNOSIS — I872 Venous insufficiency (chronic) (peripheral): Secondary | ICD-10-CM | POA: Insufficient documentation

## 2020-06-26 DIAGNOSIS — J42 Unspecified chronic bronchitis: Secondary | ICD-10-CM | POA: Insufficient documentation

## 2020-06-26 DIAGNOSIS — Z79899 Other long term (current) drug therapy: Secondary | ICD-10-CM | POA: Diagnosis not present

## 2020-06-26 DIAGNOSIS — R0600 Dyspnea, unspecified: Secondary | ICD-10-CM | POA: Insufficient documentation

## 2020-06-26 DIAGNOSIS — F32A Depression, unspecified: Secondary | ICD-10-CM | POA: Insufficient documentation

## 2020-06-26 DIAGNOSIS — I2510 Atherosclerotic heart disease of native coronary artery without angina pectoris: Secondary | ICD-10-CM | POA: Diagnosis not present

## 2020-06-26 DIAGNOSIS — I5032 Chronic diastolic (congestive) heart failure: Secondary | ICD-10-CM | POA: Diagnosis not present

## 2020-06-26 DIAGNOSIS — Z01818 Encounter for other preprocedural examination: Secondary | ICD-10-CM | POA: Insufficient documentation

## 2020-06-26 DIAGNOSIS — G4733 Obstructive sleep apnea (adult) (pediatric): Secondary | ICD-10-CM | POA: Insufficient documentation

## 2020-06-26 DIAGNOSIS — I350 Nonrheumatic aortic (valve) stenosis: Secondary | ICD-10-CM | POA: Insufficient documentation

## 2020-06-26 DIAGNOSIS — Z79891 Long term (current) use of opiate analgesic: Secondary | ICD-10-CM | POA: Insufficient documentation

## 2020-06-26 DIAGNOSIS — Z20822 Contact with and (suspected) exposure to covid-19: Secondary | ICD-10-CM | POA: Diagnosis not present

## 2020-06-26 DIAGNOSIS — E785 Hyperlipidemia, unspecified: Secondary | ICD-10-CM | POA: Insufficient documentation

## 2020-06-26 DIAGNOSIS — E119 Type 2 diabetes mellitus without complications: Secondary | ICD-10-CM | POA: Insufficient documentation

## 2020-06-26 DIAGNOSIS — Z7901 Long term (current) use of anticoagulants: Secondary | ICD-10-CM | POA: Insufficient documentation

## 2020-06-26 DIAGNOSIS — I110 Hypertensive heart disease with heart failure: Secondary | ICD-10-CM | POA: Insufficient documentation

## 2020-06-26 LAB — BLOOD GAS, ARTERIAL
Acid-base deficit: 0 mmol/L (ref 0.0–2.0)
Bicarbonate: 24.5 mmol/L (ref 20.0–28.0)
Drawn by: 602861
FIO2: 21
O2 Saturation: 96.9 %
Patient temperature: 37
pCO2 arterial: 42.6 mmHg (ref 32.0–48.0)
pH, Arterial: 7.378 (ref 7.350–7.450)
pO2, Arterial: 90.4 mmHg (ref 83.0–108.0)

## 2020-06-26 LAB — CBC
HCT: 34.5 % — ABNORMAL LOW (ref 39.0–52.0)
Hemoglobin: 10.6 g/dL — ABNORMAL LOW (ref 13.0–17.0)
MCH: 27.7 pg (ref 26.0–34.0)
MCHC: 30.7 g/dL (ref 30.0–36.0)
MCV: 90.3 fL (ref 80.0–100.0)
Platelets: 247 10*3/uL (ref 150–400)
RBC: 3.82 MIL/uL — ABNORMAL LOW (ref 4.22–5.81)
RDW: 15.6 % — ABNORMAL HIGH (ref 11.5–15.5)
WBC: 6.5 10*3/uL (ref 4.0–10.5)
nRBC: 0 % (ref 0.0–0.2)

## 2020-06-26 LAB — COMPREHENSIVE METABOLIC PANEL
ALT: 13 U/L (ref 0–44)
AST: 18 U/L (ref 15–41)
Albumin: 3.3 g/dL — ABNORMAL LOW (ref 3.5–5.0)
Alkaline Phosphatase: 117 U/L (ref 38–126)
Anion gap: 9 (ref 5–15)
BUN: 17 mg/dL (ref 8–23)
CO2: 23 mmol/L (ref 22–32)
Calcium: 9 mg/dL (ref 8.9–10.3)
Chloride: 101 mmol/L (ref 98–111)
Creatinine, Ser: 1.15 mg/dL (ref 0.61–1.24)
GFR, Estimated: >60 mL/min (ref >60)
Glucose, Bld: 88 mg/dL (ref 70–99)
Potassium: 4.5 mmol/L (ref 3.5–5.1)
Sodium: 133 mmol/L — ABNORMAL LOW (ref 135–145)
Total Bilirubin: 0.6 mg/dL (ref 0.3–1.2)
Total Protein: 6.5 g/dL (ref 6.5–8.1)

## 2020-06-26 LAB — PROTIME-INR
INR: 1 (ref 0.8–1.2)
Prothrombin Time: 13.2 seconds (ref 11.4–15.2)

## 2020-06-26 LAB — TYPE AND SCREEN
ABO/RH(D): O POS
Antibody Screen: NEGATIVE

## 2020-06-26 LAB — URINALYSIS, ROUTINE W REFLEX MICROSCOPIC
Bilirubin Urine: NEGATIVE
Glucose, UA: NEGATIVE mg/dL
Hgb urine dipstick: NEGATIVE
Ketones, ur: NEGATIVE mg/dL
Leukocytes,Ua: NEGATIVE
Nitrite: NEGATIVE
Protein, ur: NEGATIVE mg/dL
Specific Gravity, Urine: 1.005 (ref 1.005–1.030)
pH: 6 (ref 5.0–8.0)

## 2020-06-26 LAB — SURGICAL PCR SCREEN
MRSA, PCR: NEGATIVE
Staphylococcus aureus: NEGATIVE

## 2020-06-26 LAB — GLUCOSE, CAPILLARY
Glucose-Capillary: 62 mg/dL — ABNORMAL LOW (ref 70–99)
Glucose-Capillary: 81 mg/dL (ref 70–99)

## 2020-06-26 LAB — SARS CORONAVIRUS 2 (TAT 6-24 HRS): SARS Coronavirus 2: NEGATIVE

## 2020-06-26 NOTE — Progress Notes (Signed)
[id number] [address], [place] [address] Phone: [phone] Fax: [phone]      Your procedure is scheduled on Tuesday, January 11th.  Report to [facility] Main Entrance "A" at 8:45 A.M., and check in at the Admitting office.  Call this number if you have problems the morning of surgery:  [phone]  Call [phone] if you have any questions prior to your surgery date Monday-Friday 8am-4pm    Remember:  Do not eat or drink after midnight the night before your surgery   Take these medicines the morning of surgery with A SIP OF WATER   NONE  As of today, STOP taking any Aspirin (unless otherwise instructed by your surgeon) Aleve, Naproxen, Ibuprofen, Motrin, Advil, Goody's, BC's, all herbal medications, fish oil, and all vitamins.   HOW TO MANAGE YOUR DIABETES BEFORE AND AFTER SURGERY  Why is it important to control my blood sugar before and after surgery? . Improving blood sugar levels before and after surgery helps healing and can limit problems. . A way of improving blood sugar control is eating a healthy diet by: o  Eating less sugar and carbohydrates o  Increasing activity/exercise o  Talking with your doctor about reaching your blood sugar goals . High blood sugars (greater than 180 mg/dL) can raise your risk of infections and slow your recovery, so you will need to focus on controlling your diabetes during the weeks before surgery. . Make sure that the doctor who takes care of your diabetes knows about your planned surgery including the date and location.  How do I manage my blood sugar before surgery? . Check your blood sugar at least 4 times a day, starting 2 days before surgery, to make sure that the level is not too high or low. . Check your blood sugar the morning of your surgery when you wake up and every 2 hours until you get to the Short Stay unit. o If your blood sugar is less than 70  mg/dL, you will need to treat for low blood sugar: - Do not take insulin. - Treat a low blood sugar (less than 70 mg/dL) with  cup of clear juice (cranberry or apple), 4 glucose tablets, OR glucose gel. - Recheck blood sugar in 15 minutes after treatment (to make sure it is greater than 70 mg/dL). If your blood sugar is not greater than 70 mg/dL on recheck, call [phone] for further instructions. . Report your blood sugar to the short stay nurse when you get to Short Stay.  . If you are admitted to the hospital after surgery: o Your blood sugar will be checked by the staff and you will probably be given insulin after surgery (instead of oral diabetes medicines) to make sure you have good blood sugar levels. o The goal for blood sugar control after surgery is 80-180 mg/dL.               DAY OF SURGERY:            Do not wear jewelry            Do not wear lotions, powders, colognes, or deodorant.            Men may shave face and neck.            Do not bring valuables to the hospital.            [facility] is not responsible for  any belongings or valuables.  Do NOT Smoke (Tobacco/Vaping) or drink Alcohol 24 hours prior to your procedure If you use a CPAP at night, you may bring all equipment for your overnight stay.   Contacts, glasses, dentures or bridgework may not be worn into surgery.      For patients admitted to the hospital, discharge time will be determined by your treatment team.   Patients discharged the day of surgery will not be allowed to drive home, and someone needs to stay with them for 24 hours.    Special instructions:   - Preparing For Surgery  Before surgery, you can play an important role. Because skin is not sterile, your skin needs to be as free of germs as possible. You can reduce the number of germs on your skin by washing with CHG (chlorahexidine gluconate) Soap before surgery.  CHG is an antiseptic cleaner which kills germs and bonds with  the skin to continue killing germs even after washing.    Oral Hygiene is also important to reduce your risk of infection.  Remember - BRUSH YOUR TEETH THE MORNING OF SURGERY WITH YOUR REGULAR TOOTHPASTE  Please do not use if you have an allergy to CHG or antibacterial soaps. If your skin becomes reddened/irritated stop using the CHG.  Do not shave (including legs and underarms) for at least 48 hours prior to first CHG shower. It is OK to shave your face.  Please follow these instructions carefully.   1. Shower the NIGHT BEFORE SURGERY and the MORNING OF SURGERY with CHG Soap.   2. If you chose to wash your hair, wash your hair first as usual with your normal shampoo.  3. After you shampoo, rinse your hair and body thoroughly to remove the shampoo.  4. Use CHG as you would any other liquid soap. You can apply CHG directly to the skin and wash gently with a scrungie or a clean washcloth.   5. Apply the CHG Soap to your body ONLY FROM THE NECK DOWN.  Do not use on open wounds or open sores. Avoid contact with your eyes, ears, mouth and genitals (private parts). Wash Face and genitals (private parts)  with your normal soap.   6. Wash thoroughly, paying special attention to the area where your surgery will be performed.  7. Thoroughly rinse your body with warm water from the neck down.  8. DO NOT shower/wash with your normal soap after using and rinsing off the CHG Soap.  9. Pat yourself dry with a CLEAN TOWEL.  10. Wear CLEAN PAJAMAS to bed the night before surgery  11. Place CLEAN SHEETS on your bed the night of your first shower and DO NOT SLEEP WITH PETS.   Day of Surgery: Wear Clean/Comfortable clothing the morning of surgery Do not apply any deodorants/lotions.   Remember to brush your teeth WITH YOUR REGULAR TOOTHPASTE.   Please read over the following fact sheets that you were given.

## 2020-06-26 NOTE — Progress Notes (Signed)
PCP - [place]   Chest x-ray - 06-26-20 EKG - 06-05-20 ECHO - 06-11-20 Cardiac Cath - 02-25-20  SA - yes, wears CPAP  DM - Type 2 Fasting Blood Sugar - 80-85   COVID TEST- 06-26-20 @ PAT appointment   Anesthesia review: yes, heart history  Patient denies shortness of breath, fever, cough and chest pain at PAT appointment   All instructions explained to the patient, with a verbal understanding of the material. Patient agrees to go over the instructions while at home for a better understanding. Patient also instructed to self quarantine after being tested for COVID-19. The opportunity to ask questions was provided.

## 2020-06-29 MED ORDER — POTASSIUM CHLORIDE 2 MEQ/ML IV SOLN
80.0000 meq | INTRAVENOUS | Status: DC
  Filled 2020-06-29: qty 40

## 2020-06-29 MED ORDER — NOREPINEPHRINE 4 MG/250ML-% IV SOLN
0.0000 ug/min | INTRAVENOUS | Status: AC
  Administered 2020-06-30: 1 ug/min via INTRAVENOUS
  Filled 2020-06-29: qty 250

## 2020-06-29 MED ORDER — SODIUM CHLORIDE 0.9 % IV SOLN
INTRAVENOUS | Status: DC
  Filled 2020-06-29: qty 30

## 2020-06-29 MED ORDER — DEXMEDETOMIDINE HCL IN NACL 400 MCG/100ML IV SOLN
0.1000 ug/kg/h | INTRAVENOUS | Status: AC
  Administered 2020-06-30: .7 ug/kg/h via INTRAVENOUS
  Administered 2020-06-30: 42.76 ug via INTRAVENOUS
  Filled 2020-06-29: qty 100

## 2020-06-29 MED ORDER — MAGNESIUM SULFATE 50 % IJ SOLN
40.0000 meq | INTRAMUSCULAR | Status: DC
  Filled 2020-06-29: qty 9.85

## 2020-06-29 MED ORDER — VANCOMYCIN HCL 1500 MG/300ML IV SOLN
1500.0000 mg | INTRAVENOUS | Status: AC
  Administered 2020-06-30: 1500 mg via INTRAVENOUS
  Filled 2020-06-29: qty 300

## 2020-06-29 MED ORDER — SODIUM CHLORIDE 0.9 % IV SOLN
1.5000 g | INTRAVENOUS | Status: AC
  Administered 2020-06-30: 1.5 g via INTRAVENOUS
  Filled 2020-06-29: qty 1.5

## 2020-06-29 NOTE — H&P (Signed)
[address]       [place] [address]             [phone]      Cardiothoracic Surgery Admission History and Physical   Referring Provider is Jerline Pain, MD Primary Cardiologist is Candee Furbish, MD PCP is [name], MD      Chief Complaint  Patient presents with  . Aortic Stenosis        HPI:  The patient is a 70 year old gentleman with a history of diabetes, hypertension, hyperlipidemia, OSA on CPAP, chronic venous insufficiency with bilateral lower extremity edema and history of venous stasis ulcers, active 1 pack/day smoking with COPD, and arthritis who was admitted in September 2021 with acute hypoxic respiratory failure due to acute diastolic congestive heart failure and aortic valve disease.  He presented with a couple week history of progressive exertional shortness of breath and chest x-ray on admission showed pulmonary edema and bilateral pleural effusions.  He improved with IV diuresis which was complicated by acute kidney injury.  This resolved and he was maintained on oral Lasix.  He had a 2D echocardiogram on 03/04/2020 which had poor windows but reportedly showed mild aortic insufficiency and moderate to severe aortic stenosis with a mean gradient of 36.5 mmHg and a valve area of 0.68 cm.  He subsequently had a TEE done on 03/09/2020 which showed a calcified and thickened aortic valve with restricted leaflet opening and a mean gradient of 43 mmHg.  There was severe aortic insufficiency.  Left ventricular ejection fraction was 60 to 65%.  Cardiac catheterization showed nonobstructive coronary disease.  He was seen by Dr. Burt Knack for consideration of TAVR. He was scheduled for TAVR on 06/09/2020 but when he arrived in the morning he was found to have severe Candida infection of both groins and surgery was cancelled. He has completed treatment at home and seen back in the office at the end of last week and his groins healed up nicely.   He  lives alone.  He has an aunt and 2 cousins who live close by and are his closest relatives.  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       Past Medical History:  Diagnosis Date  . Anxiety   . Aortic valve disease    Severe aortic insufficiency, moderate to severe aortic stenosis  . Arthritis   . CAD (coronary artery disease)    cath 9/21: LAD calcified, pLCx 49, mRCA 30, dRCA 30  . Chronic diastolic CHF   . COPD (chronic obstructive pulmonary disease) ([place])   . Depression   . Diabetes mellitus without complication ([place])   . GERD (gastroesophageal reflux disease)   . Hyperlipidemia   . Hypertension   . Migraine   . Obesity   . Sleep apnea    CPAP   . Venous insufficiency    managed by ortho (Dr. Sharol Given)         Past Surgical History:  Procedure Laterality Date  . ANKLE SURGERY Right    Dr. Marcelino Scot, has pins  . KNEE ARTHROSCOPY     Dr. Noemi Chapel  . right foot surgery,rt great toe callus removal    . RIGHT/LEFT HEART CATH AND CORONARY ANGIOGRAPHY N/A 03/06/2020   Procedure: RIGHT/LEFT HEART CATH AND CORONARY ANGIOGRAPHY;  Surgeon: Sherren Mocha, MD;  Location: [facility];  Service: Cardiovascular;  Laterality: N/A;  . TEE WITHOUT CARDIOVERSION N/A 03/09/2020   Procedure: TRANSESOPHAGEAL ECHOCARDIOGRAM (TEE);  Surgeon: Dorothy Spark, MD;  Location: [facility] ENDOSCOPY;  Service: Cardiovascular;  Laterality: N/A;  . TONSILLECTOMY AND ADENOIDECTOMY     age 37         Family History  Problem Relation Age of Onset  . Hypertension Mother   . Alzheimer's disease Mother   . Stroke Father        x 3  . Heart attack Father   . Congestive Heart Failure Father   . Diabetes Other        mat great aunts  . Colon cancer Neg Hx   . Rectal cancer Neg Hx   . Esophageal cancer Neg Hx     Social History         Socioeconomic History  . Marital status: Single    Spouse name: Not on file  . Number of children: 0  . Years of education: Not on file  . Highest education level: Not on file  Occupational History  . Occupation: retired Youth worker: RETIRED  Tobacco Use  . Smoking status: Current Some Day Smoker    Packs/day: 2.00    Years: 20.00    Pack years: 40.00    Types: Cigarettes  . Smokeless tobacco: Never Used  Vaping Use  . Vaping Use: Never used  Substance and Sexual Activity  . Alcohol use: No    Alcohol/week: 0.0 standard drinks  . Drug use: No  . Sexual activity: Yes    Birth control/protection: Condom  Other Topics Concern  . Not on file  Social History Narrative  . Not on file   Social Determinants of Health      Financial Resource Strain:   . Difficulty of Paying Living Expenses: Not on file  Food Insecurity:   . Worried About Charity fundraiser in the Last Year: Not on file  . Ran Out of Food in the Last Year: Not on file  Transportation Needs:   . Lack of Transportation (Medical): Not on file  . Lack of Transportation (Non-Medical): Not on file  Physical Activity:   . Days of Exercise per Week: Not on file  . Minutes of Exercise per Session: Not on file  Stress:   . Feeling of Stress : Not on file  Social Connections:   . Frequency of Communication with Friends and Family: Not on file  . Frequency of Social Gatherings with Friends and Family: Not on file  . Attends Religious Services: Not on file  . Active Member of Clubs or Organizations: Not on file  . Attends Archivist Meetings: Not on file  . Marital Status: Not on file  Intimate Partner Violence:   . Fear of Current or Ex-Partner: Not on file  . Emotionally Abused: Not on file  . Physically Abused: Not on file  . Sexually Abused: Not on file          Current Outpatient Medications  Medication Sig Dispense Refill  . aspirin 81 MG tablet Take  81 mg by mouth daily.    Marland Kitchen atenolol (TENORMIN) 25 MG tablet Take 1 tablet (25 mg total) by mouth daily. 90 tablet 3  . atorvastatin (LIPITOR) 80 MG tablet Take 80 mg by mouth daily after supper.     . budesonide-formoterol (SYMBICORT) 160-4.5 MCG/ACT inhaler Inhale 2 puffs into the lungs in the morning and at bedtime.  1 each 0  . butalbital-acetaminophen-caffeine (FIORICET, ESGIC) 50-325-40 MG per tablet Take 1 tablet by mouth 2 (two) times daily as needed for pain or headache.    . doxepin (SINEQUAN) 50 MG capsule Take 50 mg by mouth in the morning and at bedtime.    . folic acid (FOLVITE) 1 MG tablet Take 1 tablet (1 mg total) by mouth daily. 30 tablet 0  . furosemide (LASIX) 40 MG tablet Take 1 tablet (40 mg total) by mouth daily. 30 tablet 0  . hydrALAZINE (APRESOLINE) 50 MG tablet Take 1 tablet (50 mg total) by mouth every 8 (eight) hours. 270 tablet 3  . ipratropium-albuterol (DUONEB) 0.5-2.5 (3) MG/3ML SOLN Take 3 mLs by nebulization every 6 (six) hours as needed (shortness of breath/wheezing). 360 mL 0  . LORazepam (ATIVAN) 1 MG tablet Take 0.5 tablets (0.5 mg total) by mouth 2 (two) times daily as needed for anxiety.    . montelukast (SINGULAIR) 10 MG tablet Take 10 mg by mouth daily.    . Multiple Vitamin (MULTIVITAMIN WITH MINERALS) TABS Take 1 tablet by mouth daily.    Marland Kitchen omeprazole (PRILOSEC) 20 MG capsule Take 20 mg by mouth daily.    . tamsulosin (FLOMAX) 0.4 MG CAPS capsule Take 0.4 mg by mouth daily.    Marland Kitchen venlafaxine XR (EFFEXOR-XR) 75 MG 24 hr capsule Take 150 mg by mouth daily after supper. BRAND NAME ONLY     No current facility-administered medications for this visit.         Allergies  Allergen Reactions  . Altace [Ramipril]     Dizziness, migraine, weakness  . Codeine Itching  . Naproxen Itching      Review of Systems:              General:                      normal appetite, + decreased energy, no weight gain, + weight loss, no  fever             Cardiac:                       no chest pain with exertion, no chest pain at rest, +SOB with exertion, no resting SOB, no PND, no orthopnea, no palpitations, + arrhythmia, + atrial fibrillation, + LE edema, no dizzy spells, no syncope             Respiratory:                 + exertional shortness of breath, no home oxygen, no productive cough, no dry cough, no bronchitis, no wheezing, no hemoptysis, no asthma, no pain with inspiration or cough, + sleep apnea, + CPAP at night             GI:                               no difficulty swallowing, no reflux, no frequent heartburn, no hiatal hernia, no abdominal pain, no constipation, no diarrhea, no hematochezia, no hematemesis, no melena             GU:                              no dysuria,  + frequency, no urinary tract infection, no hematuria, no enlarged prostate, no  kidney stones, no kidney disease             Vascular:                     no pain suggestive of claudication, no pain in feet, no leg cramps, + varicose veins, no DVT, + hx of non-healing foot ulcer             Neuro:                         no stroke, no TIA's, no seizures, + headaches, no temporary blindness one eye,  no slurred speech, + peripheral neuropathy, no chronic pain, + instability of gait, no memory/cognitive dysfunction             Musculoskeletal:         + arthritis, + joint swelling, no myalgias, + difficulty walking and uses walker or cane for stability, + decreased mobility              Skin:                            no rash, no itching, no skin infections, + hx of ulcerations in lower legs from venous insufficiency followed by Dr. Sharol Given and Dr. Forde Dandy.             Psych:                         + anxiety, no depression, no nervousness, no unusual recent stress             Eyes:                           no blurry vision, no floaters, no recent vision changes, + wears glasses or contacts             ENT:                            no hearing loss, +  loose or painful teeth, no dentures, last saw dentist recently and scheduled to see Dr. Hoyt Koch on 04/16/20             Hematologic:               no easy bruising, no abnormal bleeding, no clotting disorder, no frequent epistaxis             Endocrine:                   + diabetes, does  check CBG's at home                            Physical Exam:              BP (!) 150/52   Pulse 64   Temp 97.6 F (36.4 C) (Skin)   Resp 20   Ht 6\' 1"  (1.854 m)   Wt 186 lb (84.4 kg)   SpO2 94% Comment: RA  BMI 24.54 kg/m              General:                      well-appearing  HEENT:                       Unremarkable, NCAT, PERLA, EOMI             Neck:                           no JVD, no bruits, no adenopathy              Chest:                          Decreased breath sounds at bases             CV:                              RRR, grade lll/VI crescendo/decrescendo murmur heard best at RSB, ll/VI diastolic murmur LLSB.             Abdomen:                    soft, non-tender, no masses              Extremities:                 Both lower legs have venous compression stockings on, moderate LE edema bilat             Rectal/GU                   Deferred             Neuro:                         Grossly non-focal and symmetrical throughout             Skin:                            Clean and dry, no rashes, no breakdown   Diagnostic Tests:  TRANSESOPHOGEAL ECHO REPORT       Patient Name:  Maryelizabeth RowanCHARLES M Geoghegan Date of Exam: 03/09/2020  Medical Rec #: [id number]     Height:    73.0 in  Accession #:  [id number]    Weight:    187.7 lb  Date of Birth: 12/12/1950     BSA:     2.094 m  Patient Age:  68 years     BP:      157/36 mmHg  Patient Gender: M         HR:      53 bpm.  Exam Location: Inpatient   Procedure: Transesophageal Echo, Color Doppler, Cardiac Doppler and 3D  Echo   Indications:   Aortic valve  disorder 424.1 / I35.9    History:     Patient has prior history of Echocardiogram examinations,  most          recent 03/04/2020. CHF, COPD and TIA; Risk  Factors:Hypertension,          Diabetes, Dyslipidemia, Current Smoker and Sleep Apnea.  GERD.    Sonographer:   Renella CunasJulia Swaim RDCS  Referring Phys: [id number] Jake BatheMARK C SKAINS  Diagnosing Phys: Tobias AlexanderKatarina Nelson MD   PROCEDURE: The transesophogeal probe was passed without difficulty through  the esophogus of the patient. Sedation performed  by different physician.  The patient was monitored while under deep sedation. Anesthestetic  sedation was provided intravenously by  Anesthesiology: 250.2mg  of Propofol. Patients was under conscious sedation  during this procedure. Anesthetic administered: 38mcg of Fentanyl. The  patient's vital signs; including heart rate, blood pressure, and oxygen  saturation; remained stable  throughout the procedure. The patient developed no complications during  the procedure.   IMPRESSIONS    1. Moderately calcified and thickened aortic valve with moderately  restricted leaflet openings, however transaortic gradients in the severe  range (mean gradient 43 mmHg) secondary to high flow sec to severe aortic  regurgitation.  2. Left ventricular ejection fraction, by estimation, is 60 to 65%. The  left ventricle has normal function. The left ventricle has no regional  wall motion abnormalities.  3. Right ventricular systolic function is normal. The right ventricular  size is normal.  4. Left atrial size was mildly dilated. No left atrial/left atrial  appendage thrombus was detected.  5. The mitral valve is normal in structure. Trivial mitral valve  regurgitation. No evidence of mitral stenosis.  6. The aortic valve is normal in structure. Aortic valve regurgitation is  severe. Moderate to severe aortic valve stenosis. Aortic valve mean  gradient measures 43.0 mmHg.  7. The  inferior vena cava is normal in size with greater than 50%  respiratory variability, suggesting right atrial pressure of 3 mmHg.   Conclusion(s)/Recommendation(s): Normal biventricular function without  evidence of hemodynamically significant valvular heart disease.   FINDINGS  Left Ventricle: Left ventricular ejection fraction, by estimation, is 60  to 65%. The left ventricle has normal function. The left ventricle has no  regional wall motion abnormalities. The left ventricular internal cavity  size was normal in size. There is  no left ventricular hypertrophy.   Right Ventricle: The right ventricular size is normal. No increase in  right ventricular wall thickness. Right ventricular systolic function is  normal.   Left Atrium: Left atrial size was mildly dilated. No left atrial/left  atrial appendage thrombus was detected.   Right Atrium: Right atrial size was normal in size.   Pericardium: There is no evidence of pericardial effusion.   Mitral Valve: The mitral valve is normal in structure. Trivial mitral  valve regurgitation. No evidence of mitral valve stenosis.   Tricuspid Valve: The tricuspid valve is normal in structure. Tricuspid  valve regurgitation is mild . No evidence of tricuspid stenosis.   Aortic Valve: The aortic valve is normal in structure. Aortic valve  regurgitation is severe. Aortic regurgitation PHT measures 424 msec.  Moderate to severe aortic stenosis is present. Aortic valve mean gradient  measures 43.0 mmHg. Aortic valve peak  gradient measures 73.8 mmHg.   Pulmonic Valve: The pulmonic valve was normal in structure. Pulmonic valve  regurgitation is not visualized. No evidence of pulmonic stenosis.   Aorta: The aortic root is normal in size and structure.   Venous: The inferior vena cava is normal in size with greater than 50%  respiratory variability, suggesting right atrial pressure of 3 mmHg.   IAS/Shunts: No atrial level shunt detected by  color flow Doppler.     AORTIC VALVE  AV Vmax:      429.50 cm/s  AV Vmean:     301.500 cm/s  AV VTI:      0.874 m  AV Peak Grad:   73.8 mmHg  AV Mean Grad:   43.0 mmHg  LVOT Vmax:     88.90 cm/s  LVOT  Vmean:    59.250 cm/s  LVOT VTI:     0.165 m  LVOT/AV VTI ratio: 0.19  AI PHT:      424 msec     SHUNTS  Systemic VTI: 0.16 m   [name] MD  Electronically signed by [name] MD  Signature Date/Time: 03/09/2020/3:18:40 PM      Final (Updated)   Physicians  Panel Physicians Referring Physician Case Authorizing Physician  Sherren Mocha, MD (Primary)    Procedures  RIGHT/LEFT HEART CATH AND CORONARY ANGIOGRAPHY  Conclusion  1. Nonobstructive coronary artery disease with mild nonobstructive plaquing in the RCA, wide patency of the left main, wide patency of the LAD, and mild to moderate mid circumflex stenosis 2. Moderate aortic stenosis with mean gradient 26 mmHg and calculated aortic valve area 1.6 cm. 3. Moderate pulmonary hypertension with mean PA pressure 34 mmHg, transpulmonary gradient 13 mmHg, PVR less than 2 Wood units   Surgeon Notes    03/09/2020 2:55 PM CV Procedure signed by Dorothy Spark, MD  Indications  Nonrheumatic aortic valve stenosis [I35.0 (ICD-10-CM)]  Procedural Details  Technical Details INDICATION: CHF, aortic stenosis. Referred for R/L heart catheterization  PROCEDURAL DETAILS: There was an indwelling IV in a right antecubital vein. Using normal sterile technique, the IV was changed out for a 5 Fr brachial sheath over a 0.018 inch wire. The right wrist was then prepped, draped, and anesthetized with 1% lidocaine. Using the modified Seldinger technique a 5/6 French Slender sheath was placed in the right radial artery. Intra-arterial verapamil was administered through the radial artery sheath. IV heparin was administered after a JR4 catheter was advanced into the  central aorta. A Swan-Ganz catheter was used for the right heart catheterization. Standard protocol was followed for recording of right heart pressures and sampling of oxygen saturations. Fick cardiac output was calculated. Standard Judkins catheters were used for selective coronary angiography. The aortic valve was crossed with a J-wire. LV pressure is recorded and an aortic valve pullback is performed. There were no immediate procedural complications. The patient was transferred to the post catheterization recovery area for further monitoring.    Estimated blood loss <50 mL.   During this procedure medications were administered to achieve and maintain moderate conscious sedation while the patient's heart rate, blood pressure, and oxygen saturation were continuously monitored and I was present face-to-face 100% of this time.  Medications (Filter: Administrations occurring from 1128 to 1253 on 03/06/20) (important) Continuous medications are totaled by the amount administered until 03/06/20 1253.  midazolam (VERSED) injection (mg) Total dose:  1 mg Date/Time  Rate/Dose/Volume Action  03/06/20 1203  1 mg Given    fentaNYL (SUBLIMAZE) injection (mcg) Total dose:  25 mcg Date/Time  Rate/Dose/Volume Action  03/06/20 1203  25 mcg Given    Radial Cocktail/Verapamil only (mL) Total volume:  10 mL Date/Time  Rate/Dose/Volume Action  03/06/20 1218  10 mL Given    heparin sodium (porcine) injection (Units) Total dose:  5,000 Units Date/Time  Rate/Dose/Volume Action  03/06/20 1221  5,000 Units Given    iohexol (OMNIPAQUE) 350 MG/ML injection (mL) Total volume:  30 mL Date/Time  Rate/Dose/Volume Action  03/06/20 1233  30 mL Given    Heparin (Porcine) in NaCl 1000-0.9 UT/500ML-% SOLN (mL) Total volume:  1,000 mL Date/Time  Rate/Dose/Volume Action  03/06/20 1240  500 mL Given  1240  500 mL Given    lidocaine (PF) (XYLOCAINE) 1 % injection (mL) Total volume:  5  mL Date/Time  Rate/Dose/Volume Action  03/06/20 1216  5 mL Given    nicotine (NICODERM CQ - dosed in mg/24 hours) patch 21 mg (mg) Total dose:  Cannot be calculated* Dosing weight:  102.1 *Administration dose not documented Date/Time  Rate/Dose/Volume Action  03/06/20 1128  *Not included in total MAR Hold    acetaminophen (TYLENOL) tablet 650 mg (mg) Total dose:  Cannot be calculated* Dosing weight:  102.1 *Administration dose not documented Date/Time  Rate/Dose/Volume Action  03/06/20 1128  *Not included in total MAR Hold    aspirin EC tablet 81 mg (mg) Total dose:  Cannot be calculated* Dosing weight:  102.1 *Administration dose not documented Date/Time  Rate/Dose/Volume Action  03/06/20 1128  *Not included in total MAR Hold    atorvastatin (LIPITOR) tablet 80 mg (mg) Total dose:  Cannot be calculated* *Administration dose not documented Date/Time  Rate/Dose/Volume Action  03/06/20 1128  *Not included in total MAR Hold    butalbital-acetaminophen-caffeine (FIORICET) 50-325-40 MG per tablet 1 tablet (tablet) Total dose:  Cannot be calculated* *Administration dose not documented Date/Time  Rate/Dose/Volume Action  03/06/20 1128  *Not included in total MAR Hold    enoxaparin (LOVENOX) injection 40 mg (mg) Total dose:  Cannot be calculated* Dosing weight:  102.1 *Administration dose not documented Date/Time  Rate/Dose/Volume Action  03/06/20 1128  *Not included in total MAR Hold    feeding supplement (ENSURE ENLIVE) (ENSURE ENLIVE) liquid 237 mL (mL) Total dose:  Cannot be calculated* Dosing weight:  94.6 *Administration dose not documented Date/Time  Rate/Dose/Volume Action  03/06/20 1128  *Not included in total MAR Hold    folic acid (FOLVITE) tablet 1 mg (mg) Total dose:  Cannot be calculated* Dosing weight:  102.1 *Administration dose not documented Date/Time  Rate/Dose/Volume Action  03/06/20 1128  *Not included in total MAR Hold    hydrocerin  (EUCERIN) cream (application) Total dose:  Cannot be calculated* Dosing weight:  102.1 *Administration dose not documented Date/Time  Rate/Dose/Volume Action  03/06/20 1128  *Not included in total MAR Hold    LORazepam (ATIVAN) tablet 0.5 mg (mg) Total dose:  Cannot be calculated* Dosing weight:  94.6 *Administration dose not documented Date/Time  Rate/Dose/Volume Action  03/06/20 1128  *Not included in total MAR Hold    multivitamin with minerals tablet 1 tablet (tablet) Total dose:  Cannot be calculated* Dosing weight:  94.6 *Administration dose not documented Date/Time  Rate/Dose/Volume Action  03/06/20 1128  *Not included in total MAR Hold    ondansetron (ZOFRAN) injection 4 mg (mg) Total dose:  Cannot be calculated* Dosing weight:  102.1 *Administration dose not documented Date/Time  Rate/Dose/Volume Action  03/06/20 1128  *Not included in total MAR Hold    pantoprazole (PROTONIX) EC tablet 40 mg (mg) Total dose:  Cannot be calculated* Dosing weight:  102.1 *Administration dose not documented Date/Time  Rate/Dose/Volume Action  03/06/20 1128  *Not included in total MAR Hold    sodium chloride flush (NS) 0.9 % injection 3 mL (mL) Total dose:  Cannot be calculated* Dosing weight:  94.6 *Administration dose not documented Date/Time  Rate/Dose/Volume Action  03/06/20 1128  *Not included in total MAR Hold    venlafaxine XR (EFFEXOR-XR) 24 hr capsule 150 mg (mg) Total dose:  Cannot be calculated* *Administration dose not documented Date/Time  Rate/Dose/Volume Action  03/06/20 1128  *Not included in total MAR Hold    Sedation Time  Sedation Time Physician-1: 30 minutes 30 seconds  Contrast  Medication Name Total Dose  iohexol (OMNIPAQUE) 350 MG/ML injection  30 mL    Radiation/Fluoro  Fluoro time: 4 (min) DAP: 18.8 (Gycm2) Cumulative Air Kerma: 40.3 (mGy)  Coronary Findings  Diagnostic Dominance: Right Left Anterior Descending  The vessel is  moderately calcified.  Left Circumflex  Prox Cx to Mid Cx lesion is 50% stenosed. The lesion is calcified.  Right Coronary Artery  The vessel is mildly calcified.  Mid RCA lesion is 30% stenosed.  Dist RCA lesion is 30% stenosed.  Intervention  No interventions have been documented. Left Heart  Aortic Valve There is moderate aortic valve stenosis. The aortic valve is calcified. There is restricted aortic valve motion. The aortic valve mean gradient is 26 mmHg, peak instantaneous gradient 42 mmHg, calculated aortic valve area 1.6 cm  Coronary Diagrams  Diagnostic Dominance: Right  Intervention  Implants   No implant documentation for this case.  [employer] Images  Show images for CARDIAC CATHETERIZATION Images on Long Term Storage  Show images for [employer], [employer] to Procedure Log  Procedure Log    Hemo Data   Most Recent Value  Fick Cardiac Output 7.71 L/min  Fick Cardiac Output Index 3.51 (L/min)/BSA  RA A Wave 14 mmHg  RA V Wave 11 mmHg  RA Mean 9 mmHg  RV Systolic Pressure 48 mmHg  RV Diastolic Pressure 5 mmHg  RV EDP 11 mmHg  PA Systolic Pressure 50 mmHg  PA Diastolic Pressure 22 mmHg  PA Mean 34 mmHg  PW A Wave 20 mmHg  PW V Wave 27 mmHg  PW Mean 21 mmHg  AO Systolic Pressure [id number] mmHg  AO Diastolic Pressure 42 mmHg  AO Mean 73 mmHg  LV Systolic Pressure [id number] mmHg  LV Diastolic Pressure 10 mmHg  LV EDP 17 mmHg  AOp Systolic Pressure [id number] mmHg  AOp Diastolic Pressure 45 mmHg  AOp Mean Pressure 77 mmHg  LVp Systolic Pressure [id number] mmHg  LVp Diastolic Pressure 12 mmHg  LVp EDP Pressure 20 mmHg  QP/QS 1  TPVR Index 9.68 HRUI  TSVR Index 20.77 HRUI  PVR SVR Ratio 0.2  TPVR/TSVR Ratio 0.47   ADDENDUM REPORT: 03/09/2020 16:59  CLINICAL DATA: 70 year old male with severe aortic stenosis being evaluated for a TAVR procedure.  EXAM: Cardiac TAVR CT  TECHNIQUE: The patient was scanned on a [employer]. A 120  kV retrospective scan was triggered in the descending thoracic aorta at 111 HU's. Gantry rotation speed was 250 msecs and collimation was .6 mm. No beta blockade or nitro were given. The 3D data set was reconstructed in 5% intervals of the R-R cycle. Systolic and diastolic phases were analyzed on a dedicated work station using MPR, MIP and VRT modes. The patient received 80 cc of contrast.  FINDINGS: Aortic Valve: Trileaflet with severely calcified leaflets with moderately restricted leaflet opening and no calcifications extending into the LVOT.  Aorta: Normal size with mild diffuse atherosclerotic plaque and calcifications and no dissection.  Sinotubular Junction: 26 x 25 mm  Ascending Thoracic Aorta: 33 x 32 mm  Aortic Arch: 26 x 25 mm  Descending Thoracic Aorta: 28 x 28 mm  Sinus of Valsalva Measurements:  Non-coronary: 31 mm  Right -coronary: 31 mm  Left -coronary: 34 mm  Coronary Artery Height above Annulus:  Left Main: 15 mm  Right Coronary: 16 mm  Virtual Basal Annulus Measurements:  Maximum/Minimum Diameter: 29.3 x 22.9 mm  Mean Diameter: 26.0 mm  Perimeter: 84 mm  Area: 532 mm2  Optimum Fluoroscopic Angle for Delivery: RAO 3 CRA 4.  IMPRESSION: 1. Trileaflet aortic valve with severely calcified leaflets with moderately restricted leaflet opening and no calcifications extending into the LVOT. Aortic valve calcium score 2271 consistent with severe aortic stenosis. Annular measurements suitable for delivery of 26 mm Edwards-SAPIEN 3 Ultra valve.  2. Sufficient coronary to annulus distance.  3. Optimum Fluoroscopic Angle for Delivery: RAO 3 CRA 4.  4. No thrombus in the left atrial appendage.   Electronically Signed By: [name] On: 03/09/2020 16:59   Addended by Dorothy Spark, MD on 03/09/2020 5:01 PM  Study Result  Addenda  ADDENDUM REPORT: 03/09/2020 16:59  CLINICAL DATA: 70 year old male  with severe aortic stenosis being evaluated for a TAVR procedure.  EXAM: Cardiac TAVR CT  TECHNIQUE: The patient was scanned on a [employer]. A 120 kV retrospective scan was triggered in the descending thoracic aorta at 111 HU's. Gantry rotation speed was 250 msecs and collimation was .6 mm. No beta blockade or nitro were given. The 3D data set was reconstructed in 5% intervals of the R-R cycle. Systolic and diastolic phases were analyzed on a dedicated work station using MPR, MIP and VRT modes. The patient received 80 cc of contrast.  FINDINGS: Aortic Valve: Trileaflet with severely calcified leaflets with moderately restricted leaflet opening and no calcifications extending into the LVOT.  Aorta: Normal size with mild diffuse atherosclerotic plaque and calcifications and no dissection.  Sinotubular Junction: 26 x 25 mm  Ascending Thoracic Aorta: 33 x 32 mm  Aortic Arch: 26 x 25 mm  Descending Thoracic Aorta: 28 x 28 mm  Sinus of Valsalva Measurements:  Non-coronary: 31 mm  Right -coronary: 31 mm  Left -coronary: 34 mm  Coronary Artery Height above Annulus:  Left Main: 15 mm  Right Coronary: 16 mm  Virtual Basal Annulus Measurements:  Maximum/Minimum Diameter: 29.3 x 22.9 mm  Mean Diameter: 26.0 mm  Perimeter: 84 mm  Area: 532 mm2  Optimum Fluoroscopic Angle for Delivery: RAO 3 CRA 4.  IMPRESSION: 1. Trileaflet aortic valve with severely calcified leaflets with moderately restricted leaflet opening and no calcifications extending into the LVOT. Aortic valve calcium score 2271 consistent with severe aortic stenosis. Annular measurements suitable for delivery of 26 mm Edwards-SAPIEN 3 Ultra valve.  2. Sufficient coronary to annulus distance.  3. Optimum Fluoroscopic Angle for Delivery: RAO 3 CRA 4.  4. No thrombus in the left atrial appendage.   Electronically Signed By: [name] On: 03/09/2020  16:59   Signed by Dorothy Spark, MD on 03/09/2020 5:01 PM  Narrative & Impression  EXAM: OVER-READ INTERPRETATION CT CHEST  The following report is an over-read performed by radiologist Dr. Salvatore Marvel of [facility], [place] on 03/07/2020. This over-read does not include interpretation of cardiac or coronary anatomy or pathology. The coronary CTA interpretation by the cardiologist is attached.  COMPARISON: None.  FINDINGS: Please see the separate concurrent chest CT angiogram report for details.  IMPRESSION: Please see the separate concurrent chest CT angiogram report for details.  Electronically Signed: By: Ilona Sorrel M.D. On: 03/07/2020 12:38    CLINICAL DATA: Inpatient. Severe aortic stenosis. Acute on chronic heart failure. TAVR evaluation.  EXAM: CT ANGIOGRAPHY CHEST, ABDOMEN AND PELVIS  TECHNIQUE: Multidetector CT imaging through the chest, abdomen and pelvis was performed using the standard protocol during bolus administration of intravenous contrast. Multiplanar reconstructed images and MIPs were obtained and reviewed to evaluate the vascular anatomy.  CONTRAST: 171mL OMNIPAQUE IOHEXOL 350 MG/ML SOLN  COMPARISON: 03/04/2020 chest radiograph.  FINDINGS:  CTA CHEST FINDINGS  Cardiovascular: Mild cardiomegaly. No significant pericardial effusion/thickening. Three-vessel coronary atherosclerosis. Atherosclerotic nonaneurysmal thoracic aorta, including ascending thoracic aortic atherosclerotic calcification. Diffuse thickening and coarse calcification of the aortic valve. Normal caliber pulmonary arteries. No central pulmonary emboli.  Mediastinum/Nodes: No discrete thyroid nodules. Unremarkable esophagus. No axillary adenopathy. Mild right paratracheal adenopathy up to 1.4 cm (series 6/image 60). Mildly enlarged 1.1 cm subcarinal node (series 6/image 70). Mild AP window adenopathy up to 1.3 cm (series 6/image 59). Mild  bilateral hilar adenopathy up to 1.4 cm on the right (series 6/image 66) and 1.2 cm on the left (series 6/image 65).  Lungs/Pleura: No pneumothorax. Moderate dependent bilateral pleural effusions. Moderate dependent bilateral lower lobe passive atelectasis. Mild diffuse interlobular septal thickening. No acute consolidative airspace disease, lung masses or significant pulmonary nodules.  Musculoskeletal: No aggressive appearing focal osseous lesions. Incompletely healed posterior right ninth through twelfth rib fractures. Moderate thoracic spondylosis.  CTA ABDOMEN AND PELVIS FINDINGS  Hepatobiliary: Normal liver with no liver mass. Normal gallbladder with no radiopaque cholelithiasis. No biliary ductal dilatation.  Pancreas: Normal, with no mass or duct dilation.  Spleen: Normal size. No mass.  Adrenals/Urinary Tract: Normal adrenals. No hydronephrosis. Simple 2.5 cm anterior upper left renal cyst. Additional subcentimeter hypodense renal cortical lesions scattered in both kidneys are too small to characterize and require no follow-up. Excreted contrast in the bladder. Mild diffuse bladder wall thickening and trabeculation with tiny bladder diverticula.  Stomach/Bowel: Normal non-distended stomach. Normal caliber small bowel with no small bowel wall thickening. Normal appendix. Moderate colorectal stool. No large bowel wall thickening, diverticulosis or significant pericolonic fat stranding.  Vascular/Lymphatic: Atherosclerotic nonaneurysmal abdominal aorta. Mild porta hepatis adenopathy up to 1.3 cm (series 6/image 119). No pelvic adenopathy.  Reproductive: Top-normal size prostate.  Other: No pneumoperitoneum, ascites or focal fluid collection.  Musculoskeletal: No aggressive appearing focal osseous lesions. Marked lumbar spondylosis.  VASCULAR MEASUREMENTS PERTINENT TO TAVR:  AORTA:  Minimal Aortic Diameter-17.3 x 17.2 mm  Severity of Aortic  Calcification-moderate  RIGHT PELVIS:  Right Common Iliac Artery -  Minimal Diameter-8.1 x 6.7 mm  Tortuosity-moderate  Calcification-severe  Right External Iliac Artery -  Minimal Diameter-8.4 x 7.8 mm  Tortuosity-moderate  Calcification-mild-to-moderate  Right Common Femoral Artery -  Minimal Diameter-9.5 x 8.0 mm  Tortuosity-mild  Calcification-severe  LEFT PELVIS:  Left Common Iliac Artery -  Minimal Diameter-8.3 x 7.2 mm  Tortuosity-moderate  Calcification-severe  Left External Iliac Artery -  Minimal Diameter-7.6 x 6.8 mm  Tortuosity-mild-to-moderate  Calcification-moderate  Left Common Femoral Artery -  Minimal Diameter-9.9 x 8.0 mm  Tortuosity-mild  Calcification-moderate  Review of the MIP images confirms the above findings.  IMPRESSION: 1. Vascular findings and measurements pertinent to potential TAVR procedure, as detailed. 2. Diffuse thickening and coarse calcification of the aortic valve, compatible with the reported history of severe aortic stenosis. 3. Mild cardiomegaly. Three-vessel coronary atherosclerosis. 4. Moderate dependent bilateral pleural effusions. 5. Mild diffuse interlobular septal thickening, compatible with mild pulmonary edema. 6. Mild porta hepatis, mild mediastinal and mild bilateral hilar lymphadenopathy, nonspecific, potentially reactive. Suggest follow-up CT chest, abdomen and pelvis with IV contrast in 3 months. 7. Mild diffuse bladder wall thickening and trabeculation with tiny bladder diverticula, suggesting nonspecific chronic bladder voiding dysfunction. 8. Moderate colorectal stool, suggesting constipation. 9. Incompletely healed posterior right ninth through twelfth rib fractures. 10. Aortic Atherosclerosis (ICD10-I70.0).   Electronically Signed By: Ilona Sorrel M.D. On: 03/07/2020 13:03   STS RISK CALCULATOR: Isolated AVR: Risk of Mortality:  1.907%   Renal Failure:  3.671%  Permanent Stroke:  0.752%  Prolonged Ventilation:  6.980%  DSW Infection:  0.098%  Reoperation:  3.732%  Morbidity or Mortality:  12.419%  Short Length of Stay:  40.415%  Long Length of Stay:  5.066%    Impression:  This 70 year old gentleman has stage D, severe, symptomatic aortic stenosis and insufficiency with New York Heart Association class II symptoms of exertional fatigue and shortness of breath consistent with chronic diastolic congestive heart failure.  He was admitted last month with acute diastolic congestive heart failure with marked volume overload but improved with diuresis.  I have personally reviewed his 2D echocardiogram, TEE, cardiac catheterization, and CTA studies.  His 2D echocardiogram had poor acoustic windows and it was not possible to delineate the morphology of his valve.  The mean gradient was measured at 37 mmHg with a dimensionless index of 0.22 and an aortic valve area of 0.7 cm.  He subsequently underwent a TEE which showed a mean gradient across aortic valve of 43 mmHg consistent with severe aortic stenosis.  There was severe aortic insufficiency.  Left ventricular ejection fraction was 60 to 65%.  Cardiac catheterization showed nonobstructive coronary disease with a mean gradient across aortic valve of 26 mmHg.  I agree that aortic valve replacement is indicated in this patient for relief of his symptoms and to prevent recurrent acute decompensation and left ventricular deterioration.  He has significant calcification of the aortic root and ascending aorta and multiple comorbid risk factors including diabetes, OSA, active heavy smoking and COPD, and chronic venous insufficiency with chronic lower extremity swelling and reduced mobility which would increase his surgical risk.  I think transcatheter aortic valve replacement would be a reasonable alternative for this patient.  His gated cardiac CTA shows anatomy suitable for transcatheter aortic  valve replacement using a SAPIEN 3 valve.  His abdominal and pelvic CTA shows adequate pelvic vascular anatomy to allow transfemoral insertion.  The patient was counseled at length regarding treatment alternatives for management of severe symptomatic aortic stenosis. The risks and benefits of surgical intervention has been discussed in detail. Long-term prognosis with medical therapy was discussed. Alternative approaches such as conventional surgical aortic valve replacement, transcatheter aortic valve replacement, and palliative medical therapy were compared and contrasted at length. This discussion was placed in the context of the patient's own specific clinical presentation and past medical history. All of his questions have been addressed.   Following the decision to proceed with transcatheter aortic valve replacement, a discussion was held regarding what types of management strategies would be attempted intraoperatively in the event of life-threatening complications, including whether or not the patient would be considered a candidate for the use of cardiopulmonary bypass and/or conversion to open sternotomy for attempted surgical intervention. The patient is aware of the fact that transient use of cardiopulmonary bypass may be necessary.  I think he would be a candidate for emergent sternotomy if needed to manage any intraoperative complications.  The patient has been advised of a variety of complications that might develop including but not limited to risks of death, stroke, paravalvular leak, aortic dissection or other major vascular complications, aortic annulus rupture, device embolization, cardiac rupture or perforation, mitral regurgitation, acute myocardial infarction, arrhythmia, heart block or bradycardia requiring permanent pacemaker placement, congestive heart failure, respiratory failure, renal failure, pneumonia, infection, other late complications related to structural valve deterioration  or migration, or other complications that might ultimately cause a temporary or permanent loss of functional  independence or other long term morbidity. The patient provides full informed consent for the procedure as described and all questions were answered.    Plan:  Transfemoral transcatheter aortic valve replacement.   Gaye Pollack, MD

## 2020-06-29 NOTE — Anesthesia Preprocedure Evaluation (Addendum)
Anesthesia Evaluation  Patient identified by MRN, date of birth, ID band Patient awake    Reviewed: Allergy & Precautions, NPO status , Patient's Chart, lab work & pertinent test results, reviewed documented beta blocker date and time   History of Anesthesia Complications Negative for: history of anesthetic complications  Airway Mallampati: II  TM Distance: >3 FB Neck ROM: Full    Dental  (+) Dental Advisory Given   Pulmonary sleep apnea (pt does not use his CPAP regularly) , COPD,  COPD inhaler and oxygen dependent, Current SmokerPatient did not abstain from smoking.,  06/26/2020 SARS coronavirus NEG   breath sounds clear to auscultation       Cardiovascular hypertension, Pt. on medications and Pt. on home beta blockers + CAD (non-obstructive by cath) and + Peripheral Vascular Disease  + Valvular Problems/Murmurs AS  Rhythm:Regular Rate:Normal + Systolic murmurs 07/3555 ECHO: Moderately calcified and thickened AV with moderately restricted leaflet openings, Severe AS with mean gradient 43 mmHg, EF 60-65%, normal function.   02/2020 Cath: Nonobstructive coronary artery disease with mild nonobstructive plaquing in the RCA, wide patency of the left main, wide patency of the LAD, and mild to moderate mid circumflex stenosis,  Moderate AS with mean gradient 26 mmHg     Neuro/Psych  Headaches, Anxiety Depression    GI/Hepatic Neg liver ROS, GERD  Medicated and Controlled,  Endo/Other  diabetes (no longer on meds)  Renal/GU negative Renal ROS     Musculoskeletal  (+) Arthritis ,   Abdominal   Peds  Hematology  (+) Blood dyscrasia (Hb 10.6), anemia ,   Anesthesia Other Findings   Reproductive/Obstetrics                            Anesthesia Physical Anesthesia Plan  ASA: III  Anesthesia Plan: MAC   Post-op Pain Management:    Induction:   PONV Risk Score and Plan: 0  Airway Management  Planned: Natural Airway and Simple Face Mask  Additional Equipment: Arterial line  Intra-op Plan:   Post-operative Plan:   Informed Consent: I have reviewed the patients History and Physical, chart, labs and discussed the procedure including the risks, benefits and alternatives for the proposed anesthesia with the patient or authorized representative who has indicated his/her understanding and acceptance.     Dental advisory given  Plan Discussed with: CRNA and Surgeon  Anesthesia Plan Comments: (PAT note written 06/29/2020 by Myra Gianotti, PA-C. )       Anesthesia Quick Evaluation

## 2020-06-29 NOTE — Progress Notes (Signed)
Anesthesia Chart Review:  Case: [id number] Date/Time: 06/30/20 1030   Procedures:      TRANSCATHETER AORTIC VALVE REPLACEMENT, TRANSFEMORAL (N/A Chest)     TRANSESOPHAGEAL ECHOCARDIOGRAM (TEE) (N/A )   Anesthesia type: General   Pre-op diagnosis: Severe Aortic Stenosis   Location: [facility] OR ROOM 16 / [place] [facility]   Surgeons: Sherren Mocha, MD     CT Surgeon: Gilford Raid, MD  DISCUSSION: Patient is a 70 year old male scheduled for the above procedure. He was initially scheduled for 06/09/20 but presented with significant bilateral groin Candida infections. TAVR was cancelled and discharged on Diflucan and Mycostatin cream. Groin sites cleared as of 06/24/20 pre-procedure visit.   History includes smoking, COPD, HTN, HLD, OSA (CPAP), chronic diastolic CHF, murmur/aortic stenosis, nonobstructive CAD (02/2020), venous insufficiency, DM2, anxiety, depression, GERD, migraines, dyspnea. S/p multiple teeth extractions 05/22/20.  Last A1c seen was 5.3% on 03/05/20.  06/26/20 presurgical COVID-19 test negative.  Anesthesia team to evaluate on the day of surgery.   VS: BP (!) 143/40   Pulse (!) 52   Temp (!) 36.3 C (Oral)   Resp 18   Ht 6\' 1"  (1.854 m)   Wt 85.5 kg   SpO2 100%   BMI 24.86 kg/m     PROVIDERS: [name], MD is PCP  Candee Furbish, MD is cardiologist Sherren Mocha, MD is structural heart cardiologist   LABS: Labs reviewed: Acceptable for surgery. (all labs ordered are listed, but only abnormal results are displayed)  Labs Reviewed  GLUCOSE, CAPILLARY - Abnormal; Notable for the following components:      Result Value   Glucose-Capillary 62 (*)    All other components within normal limits  CBC - Abnormal; Notable for the following components:   RBC 3.82 (*)    Hemoglobin 10.6 (*)    HCT 34.5 (*)    RDW 15.6 (*)    All other components within normal limits  COMPREHENSIVE METABOLIC PANEL - Abnormal; Notable for the following components:   Sodium 133 (*)    Albumin 3.3  (*)    All other components within normal limits  URINALYSIS, ROUTINE W REFLEX MICROSCOPIC - Abnormal; Notable for the following components:   Color, Urine STRAW (*)    All other components within normal limits  SARS CORONAVIRUS 2 (TAT 6-24 HRS)  SURGICAL PCR SCREEN  GLUCOSE, CAPILLARY  PROTIME-INR  BLOOD GAS, ARTERIAL  TYPE AND SCREEN     PFT 03/10/20: FVC-%Pred-Pre Latest Units: % 49  FEV1-%Pred-Pre Latest Units: % 33  FEV1FVC-%Pred-Pre Latest Units: % 67  TLC % pred Latest Units: % 78  RV % pred Latest Units: % 137  DLCO unc % pred Latest Units: % 41     IMAGES: CXR 06/26/20: IMPRESSION: COPD changes. No acute abnormalities. Again identified probable BILATERAL nipple shadows.  CTA Chest/abd/pelvis 03/07/20: IMPRESSION: 1. Vascular findings and measurements pertinent to potential TAVR procedure, as detailed. 2. Diffuse thickening and coarse calcification of the aortic valve, compatible with the reported history of severe aortic stenosis. 3. Mild cardiomegaly. Three-vessel coronary atherosclerosis. 4. Moderate dependent bilateral pleural effusions. 5. Mild diffuse interlobular septal thickening, compatible with mild pulmonary edema. 6. Mild porta hepatis, mild mediastinal and mild bilateral hilar lymphadenopathy, nonspecific, potentially reactive. Suggest follow-up CT chest, abdomen and pelvis with IV contrast in 3 months. 7. Mild diffuse bladder wall thickening and trabeculation with tiny bladder diverticula, suggesting nonspecific chronic bladder voiding dysfunction. 8. Moderate colorectal stool, suggesting constipation. 9. Incompletely healed posterior right ninth through twelfth rib fractures.  10. Aortic Atherosclerosis (ICD10-I70.0).   EKG: 06/05/20: SB at 54 bpm   CV: TEE 03/09/20: IMPRESSIONS  1. Moderately calcified and thickened aortic valve with moderately  restricted leaflet openings, however transaortic gradients in the severe  range (mean  gradient 43 mmHg) secondary to high flow sec to severe aortic  regurgitation.  2. Left ventricular ejection fraction, by estimation, is 60 to 65%. The  left ventricle has normal function. The left ventricle has no regional  wall motion abnormalities.  3. Right ventricular systolic function is normal. The right ventricular  size is normal.  4. Left atrial size was mildly dilated. No left atrial/left atrial  appendage thrombus was detected.  5. The mitral valve is normal in structure. Trivial mitral valve  regurgitation. No evidence of mitral stenosis.  6. The aortic valve is normal in structure. Aortic valve regurgitation is  severe. Moderate to severe aortic valve stenosis. Aortic valve mean  gradient measures 43.0 mmHg.  7. The inferior vena cava is normal in size with greater than 50%  respiratory variability, suggesting right atrial pressure of 3 mmHg.    CT Coronary 03/07/20: IMPRESSION: 1. Trileaflet aortic valve with severely calcified leaflets with moderately restricted leaflet opening and no calcifications extending into the LVOT. Aortic valve calcium score 2271 consistent with severe aortic stenosis. Annular measurements suitable for delivery of 26 mm Edwards-SAPIEN 3 Ultra valve. 2. Sufficient coronary to annulus distance. 3. Optimum Fluoroscopic Angle for Delivery: RAO 3 CRA 4. 4. No thrombus in the left atrial appendage.   RHC/LHC 03/06/20: 1. Nonobstructive coronary artery disease with mild nonobstructive plaquing in the RCA, wide patency of the left main, wide patency of the LAD, and mild to moderate mid circumflex stenosis [50% Prox to Mid Cx; 30% Mid RCA; 30% Dist RCA] 2. Moderate aortic stenosis with mean gradient 26 mmHg and calculated aortic valve area 1.6 cm. 3. Moderate pulmonary hypertension with mean PA pressure 34 mmHg, transpulmonary gradient 13 mmHg, PVR less than 2 Wood units   Carotid US 03/07/20: Summary:  - Right Carotid: The  extracranial vessels were near-normal with only minimal wall thickening or plaque.  - Left Carotid: The extracranial vessels were near-normal with only minimal wall thickening or plaque.  - Vertebrals: Bilateral vertebral arteries demonstrate antegrade flow.  - Subclavians: Normal flow hemodynamics were seen in bilateral subclavian arteries.    Past Medical History:  Diagnosis Date  . Anxiety   . Aortic valve disease    Severe aortic insufficiency, moderate to severe aortic stenosis  . Arthritis    knee and neck  . CAD (coronary artery disease)    cath 9/21: LAD calcified, pLCx 68, mRCA 30, dRCA 30  . Chronic diastolic CHF   . COPD (chronic obstructive pulmonary disease) ([place])   . Depression   . Diabetes mellitus without complication ([place])    Type II - not on medication "I lost about 160 lbs"   . Dyspnea   . GERD (gastroesophageal reflux disease)   . Heart murmur   . Hyperlipidemia   . Hypertension   . Migraine   . Obesity   . Sleep apnea    CPAP   . Venous insufficiency    managed by ortho (Dr. Sharol Given)    Past Surgical History:  Procedure Laterality Date  . ANKLE SURGERY Right    Dr. Marcelino Scot, has pins  . COLONOSCOPY W/ POLYPECTOMY    . KNEE ARTHROSCOPY Right    Dr. Noemi Chapel  . MULTIPLE EXTRACTIONS WITH ALVEOLOPLASTY Bilateral 05/22/2020  Procedure: MULTIPLE EXTRACTION WITH ALVEOLOPLASTY;  Surgeon: Diona Browner, DMD;  Location: [facility] OR;  Service: Oral Surgery;  Laterality: Bilateral;  . right foot surgery,rt great toe callus removal    . RIGHT/LEFT HEART CATH AND CORONARY ANGIOGRAPHY N/A 03/06/2020   Procedure: RIGHT/LEFT HEART CATH AND CORONARY ANGIOGRAPHY;  Surgeon: Sherren Mocha, MD;  Location: [facility];  Service: Cardiovascular;  Laterality: N/A;  . TEE WITHOUT CARDIOVERSION N/A 03/09/2020   Procedure: TRANSESOPHAGEAL ECHOCARDIOGRAM (TEE);  Surgeon: Dorothy Spark, MD;  Location: [facility] ENDOSCOPY;  Service: Cardiovascular;  Laterality: N/A;  . TONSILLECTOMY AND  ADENOIDECTOMY     age 44    MEDICATIONS: . ascorbic acid (VITAMIN C) 500 MG tablet  . aspirin 81 MG tablet  . atenolol (TENORMIN) 25 MG tablet  . atorvastatin (LIPITOR) 80 MG tablet  . budesonide-formoterol (SYMBICORT) 160-4.5 MCG/ACT inhaler  . butalbital-acetaminophen-caffeine (FIORICET, ESGIC) 50-325-40 MG per tablet  . fluconazole (DIFLUCAN) 100 MG tablet  . folic acid (FOLVITE) 1 MG tablet  . furosemide (LASIX) 40 MG tablet  . hydrALAZINE (APRESOLINE) 50 MG tablet  . ipratropium-albuterol (DUONEB) 0.5-2.5 (3) MG/3ML SOLN  . LORazepam (ATIVAN) 1 MG tablet  . methocarbamol (ROBAXIN) 750 MG tablet  . montelukast (SINGULAIR) 10 MG tablet  . Multiple Vitamin (MULTIVITAMIN WITH MINERALS) TABS  . nystatin cream (MYCOSTATIN)  . omeprazole (PRILOSEC) 20 MG capsule  . oxyCODONE-acetaminophen (PERCOCET) 5-325 MG tablet  . promethazine (PHENERGAN) 25 MG tablet  . venlafaxine XR (EFFEXOR-XR) 75 MG 24 hr capsule   No current facility-administered medications for this encounter.   Derrill Memo ON 06/30/2020] cefUROXime (ZINACEF) 1.5 g in sodium chloride 0.9 % 100 mL IVPB  . [START ON 06/30/2020] dexmedetomidine (PRECEDEX) 400 MCG/100ML (4 mcg/mL) infusion  . [START ON 06/30/2020] heparin 30,000 units/NS 1000 mL solution for CELLSAVER  . [START ON 06/30/2020] magnesium sulfate (IV Push/IM) injection 40 mEq  . [START ON 06/30/2020] norepinephrine (LEVOPHED) 4mg  in 255mL premix infusion  . [START ON 06/30/2020] potassium chloride injection 80 mEq  . [START ON 06/30/2020] vancomycin (VANCOREADY) IVPB 1500 mg/300 mL    Myra Gianotti, PA-C Surgical Short Stay/Anesthesiology [facility] Phone [phone] [facility] Phone [phone] 06/29/2020 12:01 PM

## 2020-06-30 ENCOUNTER — Inpatient Hospital Stay (HOSPITAL_COMMUNITY): Payer: Medicare PPO

## 2020-06-30 ENCOUNTER — Inpatient Hospital Stay (HOSPITAL_COMMUNITY)
Admission: RE | Admit: 2020-06-30 | Discharge: 2020-07-03 | DRG: 266 | Disposition: A | Discharge status: Home / Self Care | Payer: Medicare PPO | Attending: Surgery | Admitting: Surgery

## 2020-06-30 ENCOUNTER — Other Ambulatory Visit: Payer: Self-pay

## 2020-06-30 ENCOUNTER — Inpatient Hospital Stay (HOSPITAL_COMMUNITY): Payer: Medicare PPO | Admitting: Vascular Surgery

## 2020-06-30 ENCOUNTER — Other Ambulatory Visit: Payer: Self-pay | Admitting: Physician Assistant

## 2020-06-30 ENCOUNTER — Inpatient Hospital Stay (HOSPITAL_COMMUNITY): Payer: Medicare PPO | Admitting: Certified Registered Nurse Anesthetist

## 2020-06-30 ENCOUNTER — Telehealth: Payer: Self-pay | Admitting: Physician Assistant

## 2020-06-30 ENCOUNTER — Encounter (HOSPITAL_COMMUNITY): Payer: Self-pay | Admitting: Cardiovascular Disease

## 2020-06-30 ENCOUNTER — Encounter (HOSPITAL_COMMUNITY)
Admission: RE | Disposition: A | Discharge status: Home / Self Care | Payer: Self-pay | Source: Home / Self Care | Attending: Surgery

## 2020-06-30 DIAGNOSIS — F419 Anxiety disorder, unspecified: Secondary | ICD-10-CM | POA: Diagnosis present

## 2020-06-30 DIAGNOSIS — F32A Depression, unspecified: Secondary | ICD-10-CM | POA: Diagnosis present

## 2020-06-30 DIAGNOSIS — M1990 Unspecified osteoarthritis, unspecified site: Secondary | ICD-10-CM | POA: Diagnosis present

## 2020-06-30 DIAGNOSIS — Z720 Tobacco use: Secondary | ICD-10-CM | POA: Diagnosis present

## 2020-06-30 DIAGNOSIS — Z886 Allergy status to analgesic agent status: Secondary | ICD-10-CM

## 2020-06-30 DIAGNOSIS — I272 Pulmonary hypertension, unspecified: Secondary | ICD-10-CM | POA: Diagnosis present

## 2020-06-30 DIAGNOSIS — G4733 Obstructive sleep apnea (adult) (pediatric): Secondary | ICD-10-CM | POA: Diagnosis present

## 2020-06-30 DIAGNOSIS — Z6841 Body Mass Index (BMI) 40.0 and over, adult: Secondary | ICD-10-CM | POA: Diagnosis not present

## 2020-06-30 DIAGNOSIS — I352 Nonrheumatic aortic (valve) stenosis with insufficiency: Principal | ICD-10-CM | POA: Diagnosis present

## 2020-06-30 DIAGNOSIS — I2510 Atherosclerotic heart disease of native coronary artery without angina pectoris: Secondary | ICD-10-CM | POA: Diagnosis present

## 2020-06-30 DIAGNOSIS — I350 Nonrheumatic aortic (valve) stenosis: Secondary | ICD-10-CM

## 2020-06-30 DIAGNOSIS — D649 Anemia, unspecified: Secondary | ICD-10-CM | POA: Diagnosis present

## 2020-06-30 DIAGNOSIS — I700 Atherosclerosis of aorta: Secondary | ICD-10-CM | POA: Diagnosis present

## 2020-06-30 DIAGNOSIS — F17210 Nicotine dependence, cigarettes, uncomplicated: Secondary | ICD-10-CM | POA: Diagnosis present

## 2020-06-30 DIAGNOSIS — Z006 Encounter for examination for normal comparison and control in clinical research program: Secondary | ICD-10-CM

## 2020-06-30 DIAGNOSIS — Z8249 Family history of ischemic heart disease and other diseases of the circulatory system: Secondary | ICD-10-CM

## 2020-06-30 DIAGNOSIS — Z833 Family history of diabetes mellitus: Secondary | ICD-10-CM

## 2020-06-30 DIAGNOSIS — Z954 Presence of other heart-valve replacement: Secondary | ICD-10-CM | POA: Diagnosis not present

## 2020-06-30 DIAGNOSIS — I8390 Asymptomatic varicose veins of unspecified lower extremity: Secondary | ICD-10-CM | POA: Diagnosis present

## 2020-06-30 DIAGNOSIS — K219 Gastro-esophageal reflux disease without esophagitis: Secondary | ICD-10-CM | POA: Diagnosis present

## 2020-06-30 DIAGNOSIS — E119 Type 2 diabetes mellitus without complications: Secondary | ICD-10-CM

## 2020-06-30 DIAGNOSIS — I2584 Coronary atherosclerosis due to calcified coronary lesion: Secondary | ICD-10-CM | POA: Diagnosis present

## 2020-06-30 DIAGNOSIS — I10 Essential (primary) hypertension: Secondary | ICD-10-CM | POA: Diagnosis present

## 2020-06-30 DIAGNOSIS — E785 Hyperlipidemia, unspecified: Secondary | ICD-10-CM | POA: Diagnosis present

## 2020-06-30 DIAGNOSIS — Z952 Presence of prosthetic heart valve: Secondary | ICD-10-CM | POA: Diagnosis not present

## 2020-06-30 DIAGNOSIS — E43 Unspecified severe protein-calorie malnutrition: Secondary | ICD-10-CM | POA: Diagnosis not present

## 2020-06-30 DIAGNOSIS — E1151 Type 2 diabetes mellitus with diabetic peripheral angiopathy without gangrene: Secondary | ICD-10-CM | POA: Diagnosis not present

## 2020-06-30 DIAGNOSIS — Z885 Allergy status to narcotic agent status: Secondary | ICD-10-CM

## 2020-06-30 DIAGNOSIS — I872 Venous insufficiency (chronic) (peripheral): Secondary | ICD-10-CM | POA: Diagnosis present

## 2020-06-30 DIAGNOSIS — Z8673 Personal history of transient ischemic attack (TIA), and cerebral infarction without residual deficits: Secondary | ICD-10-CM

## 2020-06-30 DIAGNOSIS — I5032 Chronic diastolic (congestive) heart failure: Secondary | ICD-10-CM | POA: Diagnosis not present

## 2020-06-30 DIAGNOSIS — Z79899 Other long term (current) drug therapy: Secondary | ICD-10-CM

## 2020-06-30 DIAGNOSIS — N179 Acute kidney failure, unspecified: Secondary | ICD-10-CM | POA: Diagnosis not present

## 2020-06-30 DIAGNOSIS — I739 Peripheral vascular disease, unspecified: Secondary | ICD-10-CM | POA: Diagnosis present

## 2020-06-30 DIAGNOSIS — Z820 Family history of epilepsy and other diseases of the nervous system: Secondary | ICD-10-CM

## 2020-06-30 DIAGNOSIS — I110 Hypertensive heart disease with heart failure: Secondary | ICD-10-CM | POA: Diagnosis not present

## 2020-06-30 DIAGNOSIS — J449 Chronic obstructive pulmonary disease, unspecified: Secondary | ICD-10-CM | POA: Diagnosis present

## 2020-06-30 DIAGNOSIS — Z888 Allergy status to other drugs, medicaments and biological substances status: Secondary | ICD-10-CM

## 2020-06-30 DIAGNOSIS — I358 Other nonrheumatic aortic valve disorders: Secondary | ICD-10-CM | POA: Diagnosis present

## 2020-06-30 DIAGNOSIS — Z7951 Long term (current) use of inhaled steroids: Secondary | ICD-10-CM

## 2020-06-30 DIAGNOSIS — Z823 Family history of stroke: Secondary | ICD-10-CM

## 2020-06-30 DIAGNOSIS — Z7982 Long term (current) use of aspirin: Secondary | ICD-10-CM

## 2020-06-30 HISTORY — DX: Presence of prosthetic heart valve: Z95.2

## 2020-06-30 HISTORY — PX: TEE WITHOUT CARDIOVERSION: SHX5443

## 2020-06-30 HISTORY — DX: Nonrheumatic aortic (valve) stenosis: I35.0

## 2020-06-30 HISTORY — PX: TRANSCATHETER AORTIC VALVE REPLACEMENT, TRANSFEMORAL: SHX6400

## 2020-06-30 LAB — ECHO TEE
AV Mean grad: 43 mmHg
AV Peak grad: 73.8 mmHg
Ao pk vel: 4.3 m/s
P 1/2 time: 424 msec

## 2020-06-30 LAB — GLUCOSE, CAPILLARY
Glucose-Capillary: 52 mg/dL — ABNORMAL LOW (ref 70–99)
Glucose-Capillary: 86 mg/dL (ref 70–99)
Glucose-Capillary: 63 mg/dL — ABNORMAL LOW (ref 70–99)
Glucose-Capillary: 90 mg/dL (ref 70–99)
Glucose-Capillary: 119 mg/dL — ABNORMAL HIGH (ref 70–99)

## 2020-06-30 LAB — POCT I-STAT, CHEM 8
BUN: 22 mg/dL (ref 8–23)
BUN: 23 mg/dL (ref 8–23)
BUN: 21 mg/dL (ref 8–23)
Calcium, Ion: 1.34 mmol/L (ref 1.15–1.40)
Calcium, Ion: 1.37 mmol/L (ref 1.15–1.40)
Calcium, Ion: 1.29 mmol/L (ref 1.15–1.40)
Chloride: 101 mmol/L (ref 98–111)
Chloride: 100 mmol/L (ref 98–111)
Chloride: 101 mmol/L (ref 98–111)
Creatinine, Ser: 1.1 mg/dL (ref 0.61–1.24)
Creatinine, Ser: 1 mg/dL (ref 0.61–1.24)
Creatinine, Ser: 1 mg/dL (ref 0.61–1.24)
Glucose, Bld: 76 mg/dL (ref 70–99)
Glucose, Bld: 82 mg/dL (ref 70–99)
Glucose, Bld: 80 mg/dL (ref 70–99)
HCT: 33 % — ABNORMAL LOW (ref 39.0–52.0)
HCT: 30 % — ABNORMAL LOW (ref 39.0–52.0)
HCT: 31 % — ABNORMAL LOW (ref 39.0–52.0)
Hemoglobin: 11.2 g/dL — ABNORMAL LOW (ref 13.0–17.0)
Hemoglobin: 10.2 g/dL — ABNORMAL LOW (ref 13.0–17.0)
Hemoglobin: 10.5 g/dL — ABNORMAL LOW (ref 13.0–17.0)
Potassium: 4.5 mmol/L (ref 3.5–5.1)
Potassium: 4.5 mmol/L (ref 3.5–5.1)
Potassium: 4.2 mmol/L (ref 3.5–5.1)
Sodium: 135 mmol/L (ref 135–145)
Sodium: 136 mmol/L (ref 135–145)
Sodium: 136 mmol/L (ref 135–145)
TCO2: 25 mmol/L (ref 22–32)
TCO2: 26 mmol/L (ref 22–32)
TCO2: 25 mmol/L (ref 22–32)

## 2020-06-30 LAB — ECHOCARDIOGRAM LIMITED
AR max vel: 1.26 cm2
AV Area VTI: 1.48 cm2
AV Area mean vel: 1.37 cm2
AV Mean grad: 37 mmHg
AV Peak grad: 24.6 mmHg
Ao pk vel: 2.48 m/s

## 2020-06-30 SURGERY — IMPLANTATION, AORTIC VALVE, TRANSCATHETER, FEMORAL APPROACH
Anesthesia: Monitor Anesthesia Care | Site: Chest

## 2020-06-30 MED ORDER — MIDAZOLAM HCL 2 MG/2ML IJ SOLN
INTRAMUSCULAR | Status: DC | PRN
  Administered 2020-06-30: 1 mg via INTRAVENOUS

## 2020-06-30 MED ORDER — ONDANSETRON HCL 4 MG/2ML IJ SOLN: 4.0000 mg | Freq: Four times a day (QID) | INTRAMUSCULAR | Status: DC | PRN

## 2020-06-30 MED ORDER — PANTOPRAZOLE SODIUM 40 MG PO TBEC
40.0000 mg | DELAYED_RELEASE_TABLET | Freq: Every day | ORAL | Status: DC
  Administered 2020-06-30 – 2020-07-03 (×4): 40 mg via ORAL
  Filled 2020-06-30 (×4): qty 1

## 2020-06-30 MED ORDER — SODIUM CHLORIDE 0.9 % IV SOLN: 250.0000 mL | INTRAVENOUS | Status: DC | PRN

## 2020-06-30 MED ORDER — HEPARIN SODIUM (PORCINE) 1000 UNIT/ML IJ SOLN
INTRAMUSCULAR | Status: AC
  Filled 2020-06-30: qty 1

## 2020-06-30 MED ORDER — PROTAMINE SULFATE 10 MG/ML IV SOLN
INTRAVENOUS | Status: AC
  Filled 2020-06-30: qty 5

## 2020-06-30 MED ORDER — CHLORHEXIDINE GLUCONATE 4 % EX LIQD: 60.0000 mL | Freq: Once | CUTANEOUS | Status: DC

## 2020-06-30 MED ORDER — SODIUM CHLORIDE 0.9 % IV SOLN: INTRAVENOUS | Status: DC

## 2020-06-30 MED ORDER — VENLAFAXINE HCL ER 75 MG PO CP24
75.0000 mg | ORAL_CAPSULE | Freq: Every day | ORAL | Status: DC
  Administered 2020-07-01 – 2020-07-03 (×3): 75 mg via ORAL
  Filled 2020-06-30 (×3): qty 1

## 2020-06-30 MED ORDER — FENTANYL CITRATE (PF) 250 MCG/5ML IJ SOLN
INTRAMUSCULAR | Status: AC
  Filled 2020-06-30: qty 5

## 2020-06-30 MED ORDER — PROPOFOL 10 MG/ML IV BOLUS
INTRAVENOUS | Status: DC | PRN
  Administered 2020-06-30 (×2): 15 mg via INTRAVENOUS

## 2020-06-30 MED ORDER — PROTAMINE SULFATE 10 MG/ML IV SOLN
INTRAVENOUS | Status: AC
  Filled 2020-06-30: qty 25

## 2020-06-30 MED ORDER — LIDOCAINE HCL 1 % IJ SOLN
INTRAMUSCULAR | Status: AC
  Filled 2020-06-30: qty 20

## 2020-06-30 MED ORDER — MORPHINE SULFATE (PF) 4 MG/ML IV SOLN: 1.0000 mg | INTRAVENOUS | Status: DC | PRN

## 2020-06-30 MED ORDER — MOMETASONE FURO-FORMOTEROL FUM 200-5 MCG/ACT IN AERO
2.0000 | INHALATION_SPRAY | Freq: Two times a day (BID) | RESPIRATORY_TRACT | Status: DC
  Administered 2020-06-30 – 2020-07-03 (×6): 2 via RESPIRATORY_TRACT
  Filled 2020-06-30: qty 8.8

## 2020-06-30 MED ORDER — DEXTROSE 50 % IV SOLN
25.0000 mL | Freq: Once | INTRAVENOUS | Status: AC
  Filled 2020-06-30: qty 50

## 2020-06-30 MED ORDER — LORAZEPAM 0.5 MG PO TABS
0.5000 mg | ORAL_TABLET | Freq: Two times a day (BID) | ORAL | Status: DC | PRN
  Administered 2020-07-03: 0.5 mg via ORAL
  Filled 2020-06-30: qty 1

## 2020-06-30 MED ORDER — ACETAMINOPHEN 325 MG PO TABS
650.0000 mg | ORAL_TABLET | Freq: Four times a day (QID) | ORAL | Status: DC | PRN
  Administered 2020-06-30 – 2020-07-03 (×6): 650 mg via ORAL
  Filled 2020-06-30 (×5): qty 2

## 2020-06-30 MED ORDER — CHLORHEXIDINE GLUCONATE 0.12 % MT SOLN
OROMUCOSAL | Status: AC
  Administered 2020-06-30: 15 mL via OROMUCOSAL
  Filled 2020-06-30: qty 15

## 2020-06-30 MED ORDER — PROPOFOL 500 MG/50ML IV EMUL
INTRAVENOUS | Status: DC | PRN
  Administered 2020-06-30: 10 ug/kg/min via INTRAVENOUS

## 2020-06-30 MED ORDER — OXYCODONE HCL 5 MG PO TABS: 5.0000 mg | ORAL_TABLET | ORAL | Status: DC | PRN

## 2020-06-30 MED ORDER — TRAMADOL HCL 50 MG PO TABS: 50.0000 mg | ORAL_TABLET | ORAL | Status: DC | PRN

## 2020-06-30 MED ORDER — FENTANYL CITRATE (PF) 250 MCG/5ML IJ SOLN
INTRAMUSCULAR | Status: DC | PRN
  Administered 2020-06-30 (×2): 25 ug via INTRAVENOUS

## 2020-06-30 MED ORDER — MONTELUKAST SODIUM 10 MG PO TABS
10.0000 mg | ORAL_TABLET | Freq: Every day | ORAL | Status: DC
  Administered 2020-07-01 – 2020-07-03 (×3): 10 mg via ORAL
  Filled 2020-06-30 (×3): qty 1

## 2020-06-30 MED ORDER — NITROGLYCERIN IN D5W 200-5 MCG/ML-% IV SOLN: 0.0000 ug/min | INTRAVENOUS | Status: DC

## 2020-06-30 MED ORDER — IPRATROPIUM-ALBUTEROL 0.5-2.5 (3) MG/3ML IN SOLN: 3.0000 mL | Freq: Four times a day (QID) | RESPIRATORY_TRACT | Status: DC | PRN

## 2020-06-30 MED ORDER — PHENYLEPHRINE HCL-NACL 20-0.9 MG/250ML-% IV SOLN
0.0000 ug/min | INTRAVENOUS | Status: DC
  Filled 2020-06-30: qty 250

## 2020-06-30 MED ORDER — ACETAMINOPHEN 325 MG PO TABS
ORAL_TABLET | ORAL | Status: AC
  Filled 2020-06-30: qty 2

## 2020-06-30 MED ORDER — HEPARIN SODIUM (PORCINE) 1000 UNIT/ML IJ SOLN
INTRAMUSCULAR | Status: DC | PRN
  Administered 2020-06-30: 12000 [IU] via INTRAVENOUS

## 2020-06-30 MED ORDER — ASPIRIN EC 81 MG PO TBEC
81.0000 mg | DELAYED_RELEASE_TABLET | Freq: Every day | ORAL | Status: DC
  Administered 2020-07-01 – 2020-07-03 (×3): 81 mg via ORAL
  Filled 2020-06-30 (×3): qty 1

## 2020-06-30 MED ORDER — PHENYLEPHRINE 40 MCG/ML (10ML) SYRINGE FOR IV PUSH (FOR BLOOD PRESSURE SUPPORT)
PREFILLED_SYRINGE | INTRAVENOUS | Status: AC
  Filled 2020-06-30: qty 20

## 2020-06-30 MED ORDER — CHLORHEXIDINE GLUCONATE 0.12 % MT SOLN: 15.0000 mL | Freq: Once | OROMUCOSAL | Status: AC

## 2020-06-30 MED ORDER — SODIUM CHLORIDE 0.9% FLUSH
3.0000 mL | INTRAVENOUS | Status: DC | PRN
  Administered 2020-07-01: 3 mL via INTRAVENOUS

## 2020-06-30 MED ORDER — DEXTROSE 50 % IV SOLN
25.0000 mL | Freq: Once | INTRAVENOUS | Status: AC
  Administered 2020-06-30: 25 mL via INTRAVENOUS
  Filled 2020-06-30: qty 50

## 2020-06-30 MED ORDER — MIDAZOLAM HCL 2 MG/2ML IJ SOLN
INTRAMUSCULAR | Status: AC
  Filled 2020-06-30: qty 2

## 2020-06-30 MED ORDER — ACETAMINOPHEN 650 MG RE SUPP: 650.0000 mg | Freq: Four times a day (QID) | RECTAL | Status: DC | PRN

## 2020-06-30 MED ORDER — ATORVASTATIN CALCIUM 80 MG PO TABS
80.0000 mg | ORAL_TABLET | Freq: Every day | ORAL | Status: DC
  Administered 2020-06-30 – 2020-07-02 (×3): 80 mg via ORAL
  Filled 2020-06-30 (×3): qty 1

## 2020-06-30 MED ORDER — PROTAMINE SULFATE 10 MG/ML IV SOLN
INTRAVENOUS | Status: DC | PRN
Start: 2020-06-30 — End: 2020-06-30
  Administered 2020-06-30: 120 mg via INTRAVENOUS

## 2020-06-30 MED ORDER — SODIUM CHLORIDE 0.9 % IV SOLN
1.5000 g | Freq: Two times a day (BID) | INTRAVENOUS | Status: AC
  Administered 2020-07-01 – 2020-07-02 (×4): 1.5 g via INTRAVENOUS
  Filled 2020-06-30 (×4): qty 1.5

## 2020-06-30 MED ORDER — EPHEDRINE 5 MG/ML INJ
INTRAVENOUS | Status: AC
  Filled 2020-06-30: qty 10

## 2020-06-30 MED ORDER — 0.9 % SODIUM CHLORIDE (POUR BTL) OPTIME
TOPICAL | Status: DC | PRN
  Administered 2020-06-30: 1000 mL

## 2020-06-30 MED ORDER — SODIUM CHLORIDE 0.9% FLUSH
3.0000 mL | Freq: Two times a day (BID) | INTRAVENOUS | Status: DC
  Administered 2020-06-30 – 2020-07-02 (×5): 3 mL via INTRAVENOUS

## 2020-06-30 MED ORDER — LACTATED RINGERS IV SOLN: INTRAVENOUS | Status: DC

## 2020-06-30 MED ORDER — LIDOCAINE 2% (20 MG/ML) 5 ML SYRINGE
INTRAMUSCULAR | Status: DC | PRN
  Administered 2020-06-30 (×2): 40 mg via INTRAVENOUS

## 2020-06-30 MED ORDER — NITROGLYCERIN 0.2 MG/ML ON CALL CATH LAB
INTRAVENOUS | Status: DC | PRN
  Administered 2020-06-30 (×2): 80 ug via INTRAVENOUS

## 2020-06-30 MED ORDER — SODIUM CHLORIDE 0.9 % IV SOLN
INTRAVENOUS | Status: DC | PRN
  Administered 2020-06-30: 1500 mL via INTRAMUSCULAR

## 2020-06-30 MED ORDER — IODIXANOL 320 MG/ML IV SOLN
INTRAVENOUS | Status: DC | PRN
  Administered 2020-06-30: 60 mL

## 2020-06-30 MED ORDER — EPHEDRINE SULFATE-NACL 50-0.9 MG/10ML-% IV SOSY
PREFILLED_SYRINGE | INTRAVENOUS | Status: DC | PRN
  Administered 2020-06-30 (×2): 20 mg via INTRAVENOUS

## 2020-06-30 MED ORDER — SODIUM CHLORIDE 0.9 % IV SOLN
INTRAVENOUS | Status: AC
  Filled 2020-06-30 (×3): qty 1.2

## 2020-06-30 MED ORDER — SODIUM CHLORIDE 0.9 % IV SOLN
INTRAVENOUS | Status: AC
Start: 2020-06-30 — End: 2020-06-30

## 2020-06-30 MED ORDER — DEXTROSE 50 % IV SOLN
INTRAVENOUS | Status: AC
  Administered 2020-06-30: 25 mL via INTRAVENOUS
  Filled 2020-06-30: qty 50

## 2020-06-30 MED ORDER — LIDOCAINE HCL 1 % IJ SOLN
INTRAMUSCULAR | Status: DC | PRN
  Administered 2020-06-30: 8 mL

## 2020-06-30 MED ORDER — CLOPIDOGREL BISULFATE 75 MG PO TABS
75.0000 mg | ORAL_TABLET | Freq: Every day | ORAL | Status: DC
Start: 2020-07-01 — End: 2020-07-03
  Administered 2020-07-01 – 2020-07-03 (×3): 75 mg via ORAL
  Filled 2020-06-30 (×3): qty 1

## 2020-06-30 MED ORDER — CHLORHEXIDINE GLUCONATE 4 % EX LIQD: 30.0000 mL | CUTANEOUS | Status: DC

## 2020-06-30 MED ORDER — VANCOMYCIN HCL IN DEXTROSE 1-5 GM/200ML-% IV SOLN
1000.0000 mg | Freq: Once | INTRAVENOUS | Status: AC
  Administered 2020-06-30: 1000 mg via INTRAVENOUS
  Filled 2020-06-30: qty 200

## 2020-06-30 MED ORDER — LIDOCAINE 2% (20 MG/ML) 5 ML SYRINGE
INTRAMUSCULAR | Status: AC
  Filled 2020-06-30: qty 5

## 2020-06-30 SURGICAL SUPPLY — 85 items
BAG DECANTER FOR FLEXI CONT (MISCELLANEOUS) ×3 IMPLANT
BAG SNAP BAND KOVER 36X36 (MISCELLANEOUS) ×3 IMPLANT
BLADE CLIPPER SURG (BLADE) IMPLANT
BLADE STERNUM SYSTEM 6 (BLADE) IMPLANT
BLADE SURG 10 STRL SS (BLADE) IMPLANT
CABLE ADAPT CONN TEMP 6FT (ADAPTER) ×3 IMPLANT
CABLE ADAPT PACING TEMP 12FT (ADAPTER) ×3 IMPLANT
CANISTER SUCT 3000ML PPV (MISCELLANEOUS) IMPLANT
CATH DIAG EXPO 6F AL1 (CATHETERS) IMPLANT
CATH DIAG EXPO 6F VENT PIG 145 (CATHETERS) ×6 IMPLANT
CATH EXTERNAL FEMALE PUREWICK (CATHETERS) IMPLANT
CATH INFINITI 6F AL2 (CATHETERS) ×3 IMPLANT
CATH S G BIP PACING (CATHETERS) ×3 IMPLANT
CHLORAPREP W/TINT 26 (MISCELLANEOUS) ×3 IMPLANT
CLIP VESOCCLUDE MED 24/CT (CLIP) IMPLANT
CLIP VESOCCLUDE SM WIDE 24/CT (CLIP) IMPLANT
CLOSURE MYNX CONTROL 6F/7F (Vascular Products) ×3 IMPLANT
CNTNR URN SCR LID CUP LEK RST (MISCELLANEOUS) ×6 IMPLANT
CONT SPEC 4OZ STRL OR WHT (MISCELLANEOUS) ×9
COVER BACK TABLE 80X110 HD (DRAPES) ×3 IMPLANT
COVER WAND RF STERILE (DRAPES) IMPLANT
DECANTER SPIKE VIAL GLASS SM (MISCELLANEOUS) ×3 IMPLANT
DERMABOND ADVANCED (GAUZE/BANDAGES/DRESSINGS) ×1
DERMABOND ADVANCED .7 DNX12 (GAUZE/BANDAGES/DRESSINGS) ×2 IMPLANT
DEVICE CLOSURE PERCLS PRGLD 6F (VASCULAR PRODUCTS) ×4 IMPLANT
DRAPE INCISE IOBAN 66X45 STRL (DRAPES) IMPLANT
DRSG TEGADERM 4X4.75 (GAUZE/BANDAGES/DRESSINGS) ×3 IMPLANT
ELECT CAUTERY BLADE 6.4 (BLADE) IMPLANT
ELECT REM PT RETURN 9FT ADLT (ELECTROSURGICAL) ×3
ELECTRODE REM PT RTRN 9FT ADLT (ELECTROSURGICAL) ×2 IMPLANT
FELT TEFLON 6X6 (MISCELLANEOUS) IMPLANT
GAUZE SPONGE 4X4 12PLY STRL (GAUZE/BANDAGES/DRESSINGS) ×3 IMPLANT
GLOVE BIO SURGEON STRL SZ7.5 (GLOVE) IMPLANT
GLOVE BIO SURGEON STRL SZ8 (GLOVE) ×3 IMPLANT
GLOVE EUDERMIC 7 POWDERFREE (GLOVE) IMPLANT
GLOVE ORTHO TXT STRL SZ7.5 (GLOVE) IMPLANT
GLOVE TRIUMPH SURG SIZE 7.0 (KITS) ×3 IMPLANT
GOWN STRL REUS W/ TWL LRG LVL3 (GOWN DISPOSABLE) ×4 IMPLANT
GOWN STRL REUS W/ TWL XL LVL3 (GOWN DISPOSABLE) ×8 IMPLANT
GOWN STRL REUS W/TWL LRG LVL3 (GOWN DISPOSABLE) ×6
GOWN STRL REUS W/TWL XL LVL3 (GOWN DISPOSABLE) ×12
GUIDEWIRE SAF TJ AMPL .035X180 (WIRE) ×3 IMPLANT
GUIDEWIRE SAFE TJ AMPLATZ EXST (WIRE) ×3 IMPLANT
INSERT FOGARTY SM (MISCELLANEOUS) IMPLANT
KIT BASIN OR (CUSTOM PROCEDURE TRAY) ×3 IMPLANT
KIT HEART LEFT (KITS) ×3 IMPLANT
KIT SUCTION CATH 14FR (SUCTIONS) IMPLANT
KIT TURNOVER KIT B (KITS) ×3 IMPLANT
LOOP VESSEL MAXI BLUE (MISCELLANEOUS) IMPLANT
LOOP VESSEL MINI RED (MISCELLANEOUS) IMPLANT
NS IRRIG 1000ML POUR BTL (IV SOLUTION) ×3 IMPLANT
PACK ENDO MINOR (CUSTOM PROCEDURE TRAY) ×3 IMPLANT
PAD ARMBOARD 7.5X6 YLW CONV (MISCELLANEOUS) ×6 IMPLANT
PAD ELECT DEFIB RADIOL ZOLL (MISCELLANEOUS) ×3 IMPLANT
PENCIL BUTTON HOLSTER BLD 10FT (ELECTRODE) IMPLANT
PERCLOSE PROGLIDE 6F (VASCULAR PRODUCTS) ×6
POSITIONER HEAD DONUT 9IN (MISCELLANEOUS) ×3 IMPLANT
SET MICROPUNCTURE 5F STIFF (MISCELLANEOUS) ×3 IMPLANT
SHEATH BRITE TIP 7FR 35CM (SHEATH) ×3 IMPLANT
SHEATH PINNACLE 6F 10CM (SHEATH) ×3 IMPLANT
SHEATH PINNACLE 8F 10CM (SHEATH) ×3 IMPLANT
SLEEVE REPOSITIONING LENGTH 30 (MISCELLANEOUS) ×3 IMPLANT
STOPCOCK MORSE 400PSI 3WAY (MISCELLANEOUS) ×6 IMPLANT
SUT ETHIBOND X763 2 0 SH 1 (SUTURE) IMPLANT
SUT GORETEX CV 4 TH 22 36 (SUTURE) IMPLANT
SUT GORETEX CV4 TH-18 (SUTURE) IMPLANT
SUT MNCRL AB 3-0 PS2 18 (SUTURE) IMPLANT
SUT PROLENE 5 0 C 1 36 (SUTURE) IMPLANT
SUT PROLENE 6 0 C 1 30 (SUTURE) IMPLANT
SUT SILK  1 MH (SUTURE) ×3
SUT SILK 1 MH (SUTURE) ×2 IMPLANT
SUT VIC AB 2-0 CT1 27 (SUTURE)
SUT VIC AB 2-0 CT1 TAPERPNT 27 (SUTURE) IMPLANT
SUT VIC AB 2-0 CTX 36 (SUTURE) IMPLANT
SUT VIC AB 3-0 SH 8-18 (SUTURE) IMPLANT
SYR 50ML LL SCALE MARK (SYRINGE) ×3 IMPLANT
SYR BULB IRRIG 60ML STRL (SYRINGE) IMPLANT
SYR MEDRAD MARK V 150ML (SYRINGE) ×3 IMPLANT
TOWEL GREEN STERILE (TOWEL DISPOSABLE) ×3 IMPLANT
TRANSDUCER W/STOPCOCK (MISCELLANEOUS) ×6 IMPLANT
TRAY FOLEY SLVR 14FR TEMP STAT (SET/KITS/TRAYS/PACK) IMPLANT
TUBE SUCT INTRACARD DLP 20F (MISCELLANEOUS) IMPLANT
VALVE 26 ULTRA SAPIEN KIT (Valve) ×3 IMPLANT
WIRE EMERALD 3MM-J .035X150CM (WIRE) ×3 IMPLANT
WIRE EMERALD 3MM-J .035X260CM (WIRE) ×3 IMPLANT

## 2020-06-30 NOTE — Progress Notes (Signed)
Echocardiogram 2D Echocardiogram has been performed.  Oneal Deputy Domenick Quebedeaux 06/30/2020, 12:49 PM

## 2020-06-30 NOTE — Anesthesia Procedure Notes (Signed)
Arterial Line Insertion Start/End1/04/2021 10:35 AM Performed by: Janace Litten, CRNA, CRNA  Preanesthetic checklist: patient identified, IV checked, risks and benefits discussed, surgical consent, monitors and equipment checked and pre-op evaluation Lidocaine 1% used for infiltration Right, radial was placed Catheter size: 20 G Hand hygiene performed  and maximum sterile barriers used  Allen's test indicative of satisfactory collateral circulation Attempts: 1 Procedure performed without using ultrasound guided technique. Following insertion, Biopatch and dressing applied. Post procedure assessment: normal  Patient tolerated the procedure well with no immediate complications.

## 2020-06-30 NOTE — Anesthesia Postprocedure Evaluation (Signed)
Anesthesia Post Note  Patient: Jordan [name]  Procedure(s) Performed: TRANSCATHETER AORTIC VALVE REPLACEMENT, TRANSFEMORAL (N/A Chest) TRANSESOPHAGEAL ECHOCARDIOGRAM (TEE) (N/A )     Patient location during evaluation: Cath Lab Anesthesia Type: MAC Level of consciousness: patient cooperative and oriented Pain management: pain level controlled Vital Signs Assessment: post-procedure vital signs reviewed and stable Respiratory status: spontaneous breathing, nonlabored ventilation, respiratory function stable and patient connected to nasal cannula oxygen Cardiovascular status: blood pressure returned to baseline and stable Postop Assessment: no apparent nausea or vomiting Anesthetic complications: no   No complications documented.  Last Vitals:  Vitals:   06/30/20 0828 06/30/20 1322  BP: (!) 134/35 (!) 98/34  Pulse: (!) 30 63  Resp: 20 15  Temp: (!) 36.3 C (!) 36.4 C  SpO2: 90%     Last Pain:  Vitals:   06/30/20 1322  TempSrc: Temporal  PainSc: 0-No pain                 Anaaya Fuster,E. Damin Salido

## 2020-06-30 NOTE — Op Note (Signed)
[facility]   MULTIDISCIPLINARY HEART VALVE TEAM   TAVR OPERATIVE NOTE   Date of Procedure:  06/30/2020  Preoperative Diagnosis: Severe Aortic Stenosis   Postoperative Diagnosis: Same   Procedure:    Transcatheter Aortic Valve Replacement - Percutaneous Left Transfemoral Approach  Edwards Sapien 3 Ultra THV (size 26 mm, model # 9750TFX, serial # [id number])   Co-Surgeons:  Gaye Pollack, MD and Sherren Mocha, MD    Anesthesiologist:  Jenita Seashore, MD  Echocardiographer:  Liane Comber, MD.  Pre-operative Echo Findings:  Severe aortic stenosis, moderate to severe aortic insufficiency  Normal left ventricular systolic function  Post-operative Echo Findings:  No paravalvular leak  Normal left ventricular systolic function   BRIEF CLINICAL NOTE AND INDICATIONS FOR SURGERY  This 70 year old gentleman has stage D, severe, symptomatic aortic stenosis and insufficiency with New York Heart Association class II symptoms of exertional fatigue and shortness of breath consistent with chronic diastolic congestive heart failure. He was admitted last month with acute diastolic congestive heart failure with marked volume overload but improved with diuresis. I have personally reviewed his 2D echocardiogram, TEE, cardiac catheterization, and CTA studies. His 2D echocardiogram had poor acoustic windows and it was not possible to delineate the morphology of his valve. The mean gradient was measured at 37 mmHg with a dimensionless index of 0.22 and an aortic valve area of 0.7 cm. He subsequently underwent a TEE which showed a mean gradient across aortic valve of 43 mmHg consistent with severe aortic stenosis. There was severe aortic insufficiency. Left ventricular ejection fraction was 60 to 65%. Cardiac catheterization showed nonobstructive coronary disease with a mean gradient across aortic valve of 26 mmHg. I agree that aortic valve replacement is indicated in this patient for  relief of his symptoms and to prevent recurrent acute decompensation and left ventricular deterioration. He has significant calcification of the aortic root and ascending aorta and multiple comorbid risk factors including diabetes, OSA, active heavy smoking and COPD, and chronic venous insufficiency with chronic lower extremity swelling and reduced mobility which would increase his surgical risk. I think transcatheter aortic valve replacement would be a reasonable alternative for this patient. His gated cardiac CTA shows anatomy suitable for transcatheter aortic valve replacement using a SAPIEN 3 valve. His abdominal and pelvic CTA shows adequate pelvic vascular anatomy to allow transfemoral insertion.  The patient was counseled at length regarding treatment alternatives for management of severe symptomatic aortic stenosis. The risks and benefits of surgical intervention has been discussed in detail. Long-term prognosis with medical therapy was discussed. Alternative approaches such as conventional surgical aortic valve replacement, transcatheter aortic valve replacement, and palliative medical therapy were compared and contrasted at length. This discussion was placed in the context of the patient's own specific clinical presentation and past medical history. All ofhisquestions have been addressed.   Following the decision to proceed with transcatheter aortic valve replacement, a discussion was held regarding what types of management strategies would be attempted intraoperatively in the event of life-threatening complications, including whether or not the patient would be considered a candidate for the use of cardiopulmonary bypass and/or conversion to open sternotomy for attempted surgical intervention. The patient is aware of the fact that transient use of cardiopulmonary bypass may be necessary.I think he would be a candidate for emergent sternotomy if needed to manage any intraoperative  complications. The patient has been advised of a variety of complications that might develop including but not limited to risks of death, stroke, paravalvular leak,  aortic dissection or other major vascular complications, aortic annulus rupture, device embolization, cardiac rupture or perforation, mitral regurgitation, acute myocardial infarction, arrhythmia, heart block or bradycardia requiring permanent pacemaker placement, congestive heart failure, respiratory failure, renal failure, pneumonia, infection, other late complications related to structural valve deterioration or migration, or other complications that might ultimately cause a temporary or permanent loss of functional independence or other long term morbidity. The patient provides full informed consent for the procedure as described and all questions were answered.    DETAILS OF THE OPERATIVE PROCEDURE  PREPARATION:    The patient was brought to the operating room on the above mentioned date and appropriate monitoring was established by the anesthesia team. The patient was placed in the supine position on the operating table.  Intravenous antibiotics were administered. The patient was monitored closely throughout the procedure under conscious sedation.    Baseline transthoracic echocardiogram was performed. The patient's abdomen and both groins were prepped and draped in a sterile manner. A time out procedure was performed.   PERIPHERAL ACCESS:    Using the modified Seldinger technique, femoral arterial and venous access was obtained with placement of 6 Fr sheaths on the right side.  A pigtail diagnostic catheter was passed through the right arterial sheath under fluoroscopic guidance into the aortic root.  A temporary transvenous pacemaker catheter was passed through the right femoral venous sheath under fluoroscopic guidance into the right ventricle.  The pacemaker was tested to ensure stable lead placement and pacemaker capture.  Aortic root angiography was performed in order to determine the optimal angiographic angle for valve deployment.   TRANSFEMORAL ACCESS:   Percutaneous transfemoral access and sheath placement was performed using ultrasound guidance.  The left common femoral artery was cannulated using a micropuncture needle and appropriate location was verified using hand injection angiogram.  A pair of Abbott Perclose percutaneous closure devices were placed and a 6 French sheath replaced into the femoral artery.  The patient was heparinized systemically and ACT verified > 250 seconds.    A 14 Fr transfemoral E-sheath was introduced into the left common femoral artery after progressively dilating over an Amplatz superstiff wire. An AL-2 catheter was used to direct a straight-tip exchange length wire across the native aortic valve into the left ventricle. This was exchanged out for a pigtail catheter and position was confirmed in the LV apex. Simultaneous LV and Ao pressures were recorded.  The pigtail catheter was exchanged for an Amplatz Extra-stiff wire in the LV apex.     BALLOON AORTIC VALVULOPLASTY:   Not performed   TRANSCATHETER HEART VALVE DEPLOYMENT:   An Edwards Sapien 3 Ultra transcatheter heart valve (size 26 mm) was prepared and crimped per manufacturer's guidelines, and the proper orientation of the valve is confirmed on the [employer] delivery system. The valve was advanced through the introducer sheath using normal technique until in an appropriate position in the abdominal aorta beyond the sheath tip. The balloon was then retracted and using the fine-tuning wheel was centered on the valve. The valve was then advanced across the aortic arch using appropriate flexion of the catheter. The valve was carefully positioned across the aortic valve annulus. The Commander catheter was retracted using normal technique. Once final position of the valve has been confirmed by angiographic assessment, the  valve is deployed while temporarily holding ventilation and during rapid ventricular pacing to maintain systolic blood pressure < 50 mmHg and pulse pressure < 10 mmHg. The balloon inflation is held for >3  seconds after reaching full deployment volume. Once the balloon has fully deflated the balloon is retracted into the ascending aorta and valve function is assessed using echocardiography. There is felt to be no paravalvular leak and no central aortic insufficiency.  The patient's hemodynamic recovery following valve deployment is good.  The deployment balloon and guidewire are both removed.    PROCEDURE COMPLETION:   The sheath was removed and femoral artery closure performed.  Protamine was administered once femoral arterial repair was complete. The temporary pacemaker, pigtail catheters and femoral sheaths were removed with manual pressure used for hemostasis.  A Mynx femoral closure device was utilized following removal of the diagnostic sheath in the right femoral artery.  The patient tolerated the procedure well and is transported to the cath lab recovery area in stable condition. There were no immediate intraoperative complications. All sponge instrument and needle counts are verified correct at completion of the operation.   No blood products were administered during the operation.  The patient received a total of 60 mL of intravenous contrast during the procedure.   Gaye Pollack, MD 06/30/2020

## 2020-06-30 NOTE — Progress Notes (Signed)
Arrived on 4E from cath lab.  S/p TVAR.  A&Ox4.  Bilateral groins level 0.   CCMD notified.  Vital signs.  CHG completed.  Bed rest continues for 1 more hour Sharyn Lull

## 2020-06-30 NOTE — Anesthesia Procedure Notes (Signed)
Procedure Name: MAC Date/Time: 06/30/2020 11:20 AM Performed by: Janace Litten, CRNA Pre-anesthesia Checklist: Patient identified, Emergency Drugs available, Suction available and Patient being monitored Patient Re-evaluated:Patient Re-evaluated prior to induction Oxygen Delivery Method: Simple face mask

## 2020-06-30 NOTE — Op Note (Signed)
[facility]   MULTIDISCIPLINARY HEART VALVE TEAM   TAVR OPERATIVE NOTE   Date of Procedure:  06/30/2020  Preoperative Diagnosis: Severe Aortic Stenosis   Postoperative Diagnosis: Same   Procedure:    Transcatheter Aortic Valve Replacement - Percutaneous  Transfemoral Approach  Edwards Sapien 3 Ultra THV (size 26 mm, model # 9750TFX, serial # [id number])   Co-Surgeons:  Gaye Pollack, MD and Sherren Mocha, MD  Anesthesiologist:  Midge Minium, MD  Echocardiographer:  [name], MD  Pre-operative Echo Findings:  Severe aortic stenosis, moderate aortic insufficiency  Normal left ventricular systolic function  Post-operative Echo Findings:  No paravalvular leak  Normal left ventricular systolic function  BRIEF CLINICAL NOTE AND INDICATIONS FOR SURGERY  70 yo man with severe symptomatic aortic insufficiency and NYHA functional class II symptoms of fatigue and shortness of breath. He was recently admitted with acute diastolic heart failure and volume overload, responding to IV diuretic therapy.  The patient was found to have both severe aortic stenosis and at least moderate aortic valve insufficiency.  LVEF has been normal at 60 to 65%.  Cardiac catheterization showed nonobstructive CAD with a mean transaortic gradient of 26 mmHg.  The mean gradient by echo was 37 mmHg with a dimensionless index of 0.2 and calculated aortic valve area of 0.7.  The patient underwent multidisciplinary heart team review including formal cardiac surgical consultation by Dr. Cyndia Bent.  He was deemed an appropriate candidate for transfemoral TAVR.  He initially presented a few weeks ago for the procedure, but was found to have bilateral groin infection and was treated with antifungal therapies.  His symptoms greatly improved and he now presents for TAVR.  During the course of the patient's preoperative work up they have been evaluated comprehensively by a multidisciplinary team of  specialists coordinated through the [facility] in the [facility].  They have been demonstrated to suffer from symptomatic severe aortic stenosis as noted above. The patient has been counseled extensively as to the relative risks and benefits of all options for the treatment of severe aortic stenosis including long term medical therapy, conventional surgery for aortic valve replacement, and transcatheter aortic valve replacement.  The patient has been independently evaluated in formal cardiac surgical consultation by Dr Cyndia Bent, who deemed the patient appropriate for TAVR. Based upon review of all of the patient's preoperative diagnostic tests they are felt to be candidate for transcatheter aortic valve replacement using the transfemoral approach as an alternative to conventional surgery.    Following the decision to proceed with transcatheter aortic valve replacement, a discussion has been held regarding what types of management strategies would be attempted intraoperatively in the event of life-threatening complications, including whether or not the patient would be considered a candidate for the use of cardiopulmonary bypass and/or conversion to open sternotomy for attempted surgical intervention.  The patient has been advised of a variety of complications that might develop peculiar to this approach including but not limited to risks of death, stroke, paravalvular leak, aortic dissection or other major vascular complications, aortic annulus rupture, device embolization, cardiac rupture or perforation, acute myocardial infarction, arrhythmia, heart block or bradycardia requiring permanent pacemaker placement, congestive heart failure, respiratory failure, renal failure, pneumonia, infection, other late complications related to structural valve deterioration or migration, or other complications that might ultimately cause a temporary or permanent loss of  functional independence or other long term morbidity.  The patient provides full informed consent for  the procedure as described and all questions were answered preoperatively.  DETAILS OF THE OPERATIVE PROCEDURE  PREPARATION:   The patient is brought to the operating room on the above mentioned date and central monitoring was established by the anesthesia team including placement of a central venous catheter and radial arterial line. The patient is placed in the supine position on the operating table.  Intravenous antibiotics are administered. The patient is monitored closely throughout the procedure under conscious sedation.  Baseline transthoracic echocardiogram is performed. The patient's chest, abdomen, both groins, and both lower extremities are prepared and draped in a sterile manner. A time out procedure is performed.   PERIPHERAL ACCESS:   Using ultrasound guidance, femoral arterial and venous access is obtained with placement of 6 Fr sheaths on the right side.  A pigtail diagnostic catheter was passed through the femoral arterial sheath under fluoroscopic guidance into the aortic root.  A temporary transvenous pacemaker catheter was passed through the femoral venous sheath under fluoroscopic guidance into the right ventricle.  The pacemaker was tested to ensure stable lead placement and pacemaker capture. Aortic root angiography was performed in order to determine the optimal angiographic angle for valve deployment.  TRANSFEMORAL ACCESS:  A micropuncture technique is used to access the left femoral artery under fluoroscopic and ultrasound guidance.  2 Perclose devices are deployed at 10' and 2' positions to 'PreClose' the femoral artery. An 8 French sheath is placed and then an Amplatz Superstiff wire is advanced through the sheath. This is changed out for a 14 French transfemoral E-Sheath after progressively dilating over the Superstiff wire.  An AL-2 catheter was used to direct a  straight-tip exchange length wire across the native aortic valve into the left ventricle. This was exchanged out for a pigtail catheter and position was confirmed in the LV apex. Simultaneous LV and Ao pressures were recorded.  The pigtail catheter was exchanged for an Amplatz Extra-stiff wire in the LV apex.    BALLOON AORTIC VALVULOPLASTY:  Not performed  TRANSCATHETER HEART VALVE DEPLOYMENT:  An Edwards Sapien 3 transcatheter heart valve (size 26 mm) was prepared and crimped per manufacturer's guidelines, and the proper orientation of the valve is confirmed on the [employer] delivery system. The valve was advanced through the introducer sheath using normal technique until in an appropriate position in the abdominal aorta beyond the sheath tip. The balloon was then retracted and using the fine-tuning wheel was centered on the valve. The valve was then advanced across the aortic arch using appropriate flexion of the catheter. The valve was carefully positioned across the aortic valve annulus. The Commander catheter was retracted using normal technique. Once final position of the valve has been confirmed by angiographic assessment, the valve is deployed while temporarily holding ventilation and during rapid ventricular pacing to maintain systolic blood pressure < 50 mmHg and pulse pressure < 10 mmHg. The balloon inflation is held for >3 seconds after reaching full deployment volume. Once the balloon has fully deflated the balloon is retracted into the ascending aorta and valve function is assessed using echocardiography. The patient's hemodynamic recovery following valve deployment is good.  The deployment balloon and guidewire are both removed. Echo demostrated acceptable post-procedural gradients, stable mitral valve function, and no aortic insufficiency.    PROCEDURE COMPLETION:  The sheath was removed and femoral artery closure is performed using the 2 previously deployed Perclose devices.   Protamine is administered once femoral arterial repair was complete. The site is clear with no evidence  of bleeding or hematoma after the sutures are tightened. The temporary pacemaker and pigtail catheters are removed. Mynx closure is used for contralateral femoral arterial hemostasis for the 6 Fr sheath.  The patient tolerated the procedure well and is transported to the surgical intensive care in stable condition. There were no immediate intraoperative complications. All sponge instrument and needle counts are verified correct at completion of the operation.   The patient received a total of 60 mL of intravenous contrast during the procedure.   Sherren Mocha, MD 06/30/2020 3:57 PM

## 2020-06-30 NOTE — Progress Notes (Signed)
Mobility Specialist: Progress Note   06/30/20 1809  Mobility  Activity Ambulated in hall  Level of Assistance Minimal assist, patient does 75% or more  Assistive Device Front wheel walker  Distance Ambulated (ft) 140 ft  Mobility Response Tolerated well  Mobility performed by Mobility specialist  Bed Position Chair  $Mobility charge 1 Mobility   Pre-Mobility: 81 HR, 143/57 BP, 96% SpO2 Post-Mobility: 82 HR, 159/76 BP, 91% SpO2  Pt c/o R knee pain he rated 7/10 during ambulation. Pt otherwise asx. Pt to chair after walk per request. Pt assisted with ordering dinner and has call bell at his side.  [facility] Shanitha Twining Mobility Specialist

## 2020-06-30 NOTE — Progress Notes (Signed)
20 gauge R radial art line was removed, and manual pressure was held for 10 min. R wrist was level zero, with radial  pulse at  2 +, and capilary reffill  of < 3 sec.Marland Kitchen

## 2020-06-30 NOTE — Transfer of Care (Signed)
Immediate Anesthesia Transfer of Care Note  Patient: Jordan [name]  Procedure(s) Performed: TRANSCATHETER AORTIC VALVE REPLACEMENT, TRANSFEMORAL (N/A Chest) TRANSESOPHAGEAL ECHOCARDIOGRAM (TEE) (N/A )  Patient Location: PACU and Cath Lab  Anesthesia Type:MAC  Level of Consciousness: awake, alert  and patient cooperative  Airway & Oxygen Therapy: Patient Spontanous Breathing and Patient connected to face mask oxygen  Post-op Assessment: Report given to RN and Post -op Vital signs reviewed and stable  Post vital signs: Reviewed and stable  Last Vitals:  Vitals Value Taken Time  BP    Temp    Pulse    Resp    SpO2      Last Pain:  Vitals:   06/30/20 0828  TempSrc: Oral         Complications: No complications documented.

## 2020-06-30 NOTE — Interval H&P Note (Signed)
History and Physical Interval Note:  06/30/2020 9:49 AM  [name]  has presented today for surgery, with the diagnosis of Severe Aortic Stenosis.  The various methods of treatment have been discussed with the patient and family. After consideration of risks, benefits and other options for treatment, the patient has consented to  Procedure(s): TRANSCATHETER AORTIC VALVE REPLACEMENT, TRANSFEMORAL (N/A) TRANSESOPHAGEAL ECHOCARDIOGRAM (TEE) (N/A) as a surgical intervention.  The patient's history has been reviewed, patient examined, no change in status, stable for surgery.  I have reviewed the patient's chart and labs.  Questions were answered to the patient's satisfaction.     Gaye Pollack

## 2020-06-30 NOTE — Progress Notes (Signed)
  [place] VALVE TEAM  Patient doing well s/p TAVR. He is hemodynamically stable. Groin sites stable. ECG with sinus and no high grade block. Arterial line discontinued and transferred  to 4E. Plan for early ambulation after bedrest completed and hopeful discharge over the next 24-48 hours.   Angelena Form PA-C  [facility]  Pager [phone]

## 2020-06-30 NOTE — Telephone Encounter (Signed)
entered in error   

## 2020-07-01 ENCOUNTER — Inpatient Hospital Stay (HOSPITAL_COMMUNITY): Payer: Medicare PPO

## 2020-07-01 ENCOUNTER — Encounter (HOSPITAL_COMMUNITY): Payer: Self-pay | Admitting: Cardiovascular Disease

## 2020-07-01 DIAGNOSIS — Z954 Presence of other heart-valve replacement: Secondary | ICD-10-CM

## 2020-07-01 DIAGNOSIS — Z952 Presence of prosthetic heart valve: Secondary | ICD-10-CM | POA: Diagnosis not present

## 2020-07-01 DIAGNOSIS — I350 Nonrheumatic aortic (valve) stenosis: Secondary | ICD-10-CM

## 2020-07-01 LAB — CBC
HCT: 32 % — ABNORMAL LOW (ref 39.0–52.0)
Hemoglobin: 10.3 g/dL — ABNORMAL LOW (ref 13.0–17.0)
MCH: 28.3 pg (ref 26.0–34.0)
MCHC: 32.2 g/dL (ref 30.0–36.0)
MCV: 87.9 fL (ref 80.0–100.0)
Platelets: 177 10*3/uL (ref 150–400)
RBC: 3.64 MIL/uL — ABNORMAL LOW (ref 4.22–5.81)
RDW: 15.3 % (ref 11.5–15.5)
WBC: 11.9 10*3/uL — ABNORMAL HIGH (ref 4.0–10.5)
nRBC: 0 % (ref 0.0–0.2)

## 2020-07-01 LAB — BASIC METABOLIC PANEL
Anion gap: 9 (ref 5–15)
BUN: 23 mg/dL (ref 8–23)
CO2: 23 mmol/L (ref 22–32)
Calcium: 8.3 mg/dL — ABNORMAL LOW (ref 8.9–10.3)
Chloride: 102 mmol/L (ref 98–111)
Creatinine, Ser: 1.18 mg/dL (ref 0.61–1.24)
GFR, Estimated: >60 mL/min (ref >60)
Glucose, Bld: 92 mg/dL (ref 70–99)
Potassium: 4.8 mmol/L (ref 3.5–5.1)
Sodium: 134 mmol/L — ABNORMAL LOW (ref 135–145)

## 2020-07-01 LAB — MAGNESIUM: Magnesium: 1.7 mg/dL (ref 1.7–2.4)

## 2020-07-01 LAB — ECHOCARDIOGRAM LIMITED
AR max vel: 2.46 cm2
AV Area VTI: 2.72 cm2
AV Area mean vel: 2.55 cm2
AV Mean grad: 19 mmHg
AV Peak grad: 34.8 mmHg
Ao pk vel: 2.95 m/s
Height: 73 in
P 1/2 time: 435 msec
Weight: 2795.2 oz

## 2020-07-01 LAB — GLUCOSE, CAPILLARY: Glucose-Capillary: 79 mg/dL (ref 70–99)

## 2020-07-01 MED ORDER — HYDRALAZINE HCL 50 MG PO TABS
50.0000 mg | ORAL_TABLET | Freq: Three times a day (TID) | ORAL | Status: DC
  Administered 2020-07-01 – 2020-07-03 (×6): 50 mg via ORAL
  Filled 2020-07-01 (×6): qty 1

## 2020-07-01 MED ORDER — FUROSEMIDE 40 MG PO TABS
40.0000 mg | ORAL_TABLET | Freq: Every day | ORAL | Status: DC
  Administered 2020-07-01: 40 mg via ORAL
  Filled 2020-07-01: qty 1

## 2020-07-01 MED ORDER — ATENOLOL 25 MG PO TABS
25.0000 mg | ORAL_TABLET | Freq: Every day | ORAL | Status: DC
  Administered 2020-07-01 – 2020-07-03 (×3): 25 mg via ORAL
  Filled 2020-07-01 (×3): qty 1

## 2020-07-01 NOTE — Progress Notes (Signed)
1 Day Post-Op Procedure(s) (LRB): TRANSCATHETER AORTIC VALVE REPLACEMENT, TRANSFEMORAL (N/A) TRANSESOPHAGEAL ECHOCARDIOGRAM (TEE) (N/A) Subjective: Feels well. Ambulated.  Objective: Vital signs in last 24 hours: Temp:  [97.3 F (36.3 C)-98.8 F (37.1 C)] 98.8 F (37.1 C) (01/12 0751) Pulse Rate:  [62-91] 63 (01/12 0751) Cardiac Rhythm: Normal sinus rhythm (01/12 0701) Resp:  [15-24] 20 (01/12 0751) BP: (98-160)/(34-69) 109/62 (01/12 0751) SpO2:  [91 %-96 %] 92 % (01/12 0822) Weight:  [79.2 kg] 79.2 kg (01/12 0309)  Hemodynamic parameters for last 24 hours:    Intake/Output from previous day: 01/11 0701 - 01/12 0700 In: 2493.9 [P.O.:720; I.V.:1073.9; IV Piggyback:700.1] Out: 2075 [Urine:2075] Intake/Output this shift: No intake/output data recorded.  General appearance: alert and cooperative Neurologic: intact Heart: regular rate and rhythm, S1, S2 normal, no murmur, click, rub or gallop Lungs: clear to auscultation bilaterally Extremities: edema in legs is moderate and chronic Wound: groin sites ok  Lab Results: Recent Labs    06/30/20 1440 07/01/20 0153  WBC  --  11.9*  HGB 10.5* 10.3*  HCT 31.0* 32.0*  PLT  --  177   BMET:  Recent Labs    06/30/20 1440 07/01/20 0153  NA 136 134*  K 4.2 4.8  CL 101 102  CO2  --  23  GLUCOSE 80 92  BUN 21 23  CREATININE 1.00 1.18  CALCIUM  --  8.3*    PT/INR: No results for input(s): LABPROT, INR in the last 72 hours. ABG    Component Value Date/Time   PHART 7.378 06/26/2020 1514   HCO3 24.5 06/26/2020 1514   TCO2 25 06/30/2020 1440   ACIDBASEDEF 0.0 06/26/2020 1514   O2SAT 96.9 06/26/2020 1514   CBG (last 3)  Recent Labs    06/30/20 1100 06/30/20 2110 07/01/20 0527  GLUCAP 90 119* 79   ECG: sinus, no acute changes  Assessment/Plan: S/P Procedure(s) (LRB): TRANSCATHETER AORTIC VALVE REPLACEMENT, TRANSFEMORAL (N/A) TRANSESOPHAGEAL ECHOCARDIOGRAM (TEE) (N/A)  POD 1  Hemodynamically stable in sinus  rhythm. Monitor strip reviewed and no heart block present.  2D echo today.  ASA/Plavix for valve.  Continue mobilization. Plan home tomorrow.   LOS: 1 day    Gaye Pollack 07/01/2020

## 2020-07-01 NOTE — Progress Notes (Signed)
Patients oxygen saturation 88% on room air. Administered 2L [place]. Patient current oxygen 95% on 2L [place]. Patient states he does not feel short of breath. Respirations 14.  Daymon Larsen, RN

## 2020-07-01 NOTE — Progress Notes (Signed)
CARDIAC REHAB PHASE I   Offered to walk with pt. Pt requesting to eat breakfast first. Will follow-up.  Rufina Falco, RN BSN 07/01/2020 9:05 AM

## 2020-07-01 NOTE — Progress Notes (Signed)
CARDIAC REHAB PHASE I   Offered to walk with pt. Pt recently walked with mobility. Denies any real concerns. Fatigued now and trying to sleep. Encouraged ambulation again later today. Will f/u tomorrow.   [id number] Rufina Falco, RN BSN 07/01/2020 01:35 PM

## 2020-07-01 NOTE — Progress Notes (Addendum)
Mobility Specialist: Progress Note   07/01/20 1243  Mobility  Activity Ambulated in hall  Level of Assistance Minimal assist, patient does 75% or more  Assistive Device Front wheel walker  Distance Ambulated (ft) 120 ft  Mobility Response Tolerated well  Mobility performed by Mobility specialist  $Mobility charge 1 Mobility   Pre-Mobility:            Supine L Arm: 67 HR, 113/38 BP, 88% SpO2           Supine R Arm: 123/45 BP           Sitting EOB: 126/45 BP           Standing: 123/47 BP Post-Mobility on 2 L/min [place]: 74 HR, 130/54 BP, 96% SpO2  Pt presenting with low BP upon entering room. Pt asx and RN present for orthostatic BPs. Pt c/o 7/10 pain he said is due to his arthritis but was otherwise asx during ambulation. Pt ambulated on 2 L/min [place]. Pt back to bed after walk. RN notified pt is requesting Tylenol for pain.   [facility] Adream Parzych Mobility Specialist

## 2020-07-01 NOTE — Progress Notes (Signed)
Echocardiogram 2D Echocardiogram has been performed.  Oneal Deputy Senior 07/01/2020, 11:46 AM

## 2020-07-02 ENCOUNTER — Encounter (HOSPITAL_COMMUNITY): Payer: Self-pay | Admitting: Cardiovascular Disease

## 2020-07-02 LAB — CBC
HCT: 29.1 % — ABNORMAL LOW (ref 39.0–52.0)
Hemoglobin: 9.2 g/dL — ABNORMAL LOW (ref 13.0–17.0)
MCH: 27.9 pg (ref 26.0–34.0)
MCHC: 31.6 g/dL (ref 30.0–36.0)
MCV: 88.2 fL (ref 80.0–100.0)
Platelets: 142 10*3/uL — ABNORMAL LOW (ref 150–400)
RBC: 3.3 MIL/uL — ABNORMAL LOW (ref 4.22–5.81)
RDW: 15.8 % — ABNORMAL HIGH (ref 11.5–15.5)
WBC: 8.1 10*3/uL (ref 4.0–10.5)
nRBC: 0 % (ref 0.0–0.2)

## 2020-07-02 LAB — BASIC METABOLIC PANEL
Anion gap: 10 (ref 5–15)
BUN: 33 mg/dL — ABNORMAL HIGH (ref 8–23)
CO2: 20 mmol/L — ABNORMAL LOW (ref 22–32)
Calcium: 8 mg/dL — ABNORMAL LOW (ref 8.9–10.3)
Chloride: 102 mmol/L (ref 98–111)
Creatinine, Ser: 1.82 mg/dL — ABNORMAL HIGH (ref 0.61–1.24)
GFR, Estimated: 39 mL/min — ABNORMAL LOW (ref >60)
Glucose, Bld: 79 mg/dL (ref 70–99)
Potassium: 4.8 mmol/L (ref 3.5–5.1)
Sodium: 132 mmol/L — ABNORMAL LOW (ref 135–145)

## 2020-07-02 LAB — GLUCOSE, CAPILLARY: Glucose-Capillary: 132 mg/dL — ABNORMAL HIGH (ref 70–99)

## 2020-07-02 MED FILL — Magnesium Sulfate Inj 50%: INTRAMUSCULAR | Qty: 10 | Status: AC

## 2020-07-02 MED FILL — Potassium Chloride Inj 2 mEq/ML: INTRAVENOUS | Qty: 40 | Status: AC

## 2020-07-02 MED FILL — Heparin Sodium (Porcine) Inj 1000 Unit/ML: INTRAMUSCULAR | Qty: 30 | Status: AC

## 2020-07-02 NOTE — Progress Notes (Signed)
2 Days Post-Op Procedure(s) (LRB): TRANSCATHETER AORTIC VALVE REPLACEMENT, TRANSFEMORAL (N/A) TRANSESOPHAGEAL ECHOCARDIOGRAM (TEE) (N/A) Subjective: No complaints. Ambulated ok  Objective: Vital signs in last 24 hours: Temp:  [98 F (36.7 C)-98.6 F (37 C)] 98 F (36.7 C) (01/13 0752) Pulse Rate:  [50-60] 55 (01/13 0752) Cardiac Rhythm: Sinus bradycardia;Heart block (01/13 0315) Resp:  [14-20] 18 (01/13 0752) BP: (102-127)/(37-68) 115/37 (01/13 0752) SpO2:  [90 %-97 %] 97 % (01/13 0752) Weight:  [84.6 kg] 84.6 kg (01/13 0300)  Hemodynamic parameters for last 24 hours:    Intake/Output from previous day: 01/12 0701 - 01/13 0700 In: 1300 [P.O.:1200; IV Piggyback:100] Out: 1600 [Urine:1600] Intake/Output this shift: No intake/output data recorded.  General appearance: alert and cooperative Neurologic: intact Heart: regular rate and rhythm, S1, S2 normal, no murmur Lungs: clear to auscultation bilaterally Extremities: moderate bilateral chronic edema of legs Wound: groin sites ok  Lab Results: Recent Labs    07/01/20 0153 07/02/20 0101  WBC 11.9* 8.1  HGB 10.3* 9.2*  HCT 32.0* 29.1*  PLT 177 142*   BMET:  Recent Labs    07/01/20 0153 07/02/20 0101  NA 134* 132*  K 4.8 4.8  CL 102 102  CO2 23 20*  GLUCOSE 92 79  BUN 23 33*  CREATININE 1.18 1.82*  CALCIUM 8.3* 8.0*    PT/INR: No results for input(s): LABPROT, INR in the last 72 hours. ABG    Component Value Date/Time   PHART 7.378 06/26/2020 1514   HCO3 24.5 06/26/2020 1514   TCO2 25 06/30/2020 1440   ACIDBASEDEF 0.0 06/26/2020 1514   O2SAT 96.9 06/26/2020 1514   CBG (last 3)  Recent Labs    06/30/20 2110 07/01/20 0527 07/02/20 0517  GLUCAP 119* 79 132*    Assessment/Plan: S/P Procedure(s) (LRB): TRANSCATHETER AORTIC VALVE REPLACEMENT, TRANSFEMORAL (N/A) TRANSESOPHAGEAL ECHOCARDIOGRAM (TEE) (N/A)  POD 2  He is doing well overall. Maintaining sinus rhythm with no signs of heart block. His  creatinine bumped up to 1.82 this am from 1.18 yesterday. It guess this is related to contrast during the procedure although he only received 60 cc with normal creat preop. Will hold lasix today and repeat in am to be sure it is stable before letting him go home tomorrow.  I reviewed his 2D echo. The valve looks good. Mean gradient measured higher at 19 mm compared to 10 mm in the OR. DVI is 0.71. LVEF normal. Will follow up at one month.  Continue ambulation today, repeat BMET in am and probably home tomorrow.   LOS: 2 days    Gaye Pollack 07/02/2020

## 2020-07-02 NOTE — Progress Notes (Signed)
Mobility Specialist - Progress Note   07/02/20 1116  Mobility  Activity Ambulated in hall  Level of Assistance Minimal assist, patient does 75% or more  Assistive Device Front wheel walker  Distance Ambulated (ft) 120 ft  Mobility Response Tolerated well  Mobility performed by Mobility specialist  $Mobility charge 1 Mobility    Pre-mobility: 55 HR, 93/65 BP, 96% SpO2 During mobility: 79 HR, 96% SpO2 Post-mobility: 60 HR, 129/37 BP, 97% SpO2  Pt min assist to stand from bed, otherwise standby. Distance limited by pain from arthritis in knees. Pt back in bed after walk.   Pricilla Handler Mobility Specialist Mobility Specialist Phone: [phone]

## 2020-07-02 NOTE — Progress Notes (Signed)
CARDIAC REHAB PHASE I   PRE:  Rate/Rhythm: 47 SB  BP:  Sitting: 111/44      SaO2: 97 RA  MODE:  Ambulation: 120 ft   POST:  Rate/Rhythm: 72 SR  BP:  Sitting: 98/23    SaO2: 98 RA   Pt ambulated 167ft in hallway assist of one with front wheel walker. Pt took several short standing rest breaks c/o knee pain. Pt returned to bed. Encouraged continued ambulation and IS use. Will continue to follow.  [id number] Rufina Falco, RN BSN 07/02/2020 9:45 AM

## 2020-07-03 LAB — CBC
HCT: 32.4 % — ABNORMAL LOW (ref 39.0–52.0)
Hemoglobin: 10.3 g/dL — ABNORMAL LOW (ref 13.0–17.0)
MCH: 27.9 pg (ref 26.0–34.0)
MCHC: 31.8 g/dL (ref 30.0–36.0)
MCV: 87.8 fL (ref 80.0–100.0)
Platelets: 169 10*3/uL (ref 150–400)
RBC: 3.69 MIL/uL — ABNORMAL LOW (ref 4.22–5.81)
RDW: 15.7 % — ABNORMAL HIGH (ref 11.5–15.5)
WBC: 7.7 10*3/uL (ref 4.0–10.5)
nRBC: 0 % (ref 0.0–0.2)

## 2020-07-03 LAB — BASIC METABOLIC PANEL
Anion gap: 10 (ref 5–15)
BUN: 31 mg/dL — ABNORMAL HIGH (ref 8–23)
CO2: 22 mmol/L (ref 22–32)
Calcium: 8.4 mg/dL — ABNORMAL LOW (ref 8.9–10.3)
Chloride: 102 mmol/L (ref 98–111)
Creatinine, Ser: 1.55 mg/dL — ABNORMAL HIGH (ref 0.61–1.24)
GFR, Estimated: 48 mL/min — ABNORMAL LOW (ref >60)
Glucose, Bld: 79 mg/dL (ref 70–99)
Potassium: 4.4 mmol/L (ref 3.5–5.1)
Sodium: 134 mmol/L — ABNORMAL LOW (ref 135–145)

## 2020-07-03 MED ORDER — CLOPIDOGREL BISULFATE 75 MG PO TABS: 75.0000 mg | ORAL_TABLET | Freq: Every day | ORAL | 1 refills | Status: DC

## 2020-07-03 MED ORDER — PANTOPRAZOLE SODIUM 40 MG PO TBEC: 40.0000 mg | DELAYED_RELEASE_TABLET | Freq: Every day | ORAL | 1 refills | Status: DC

## 2020-07-03 NOTE — Discharge Summary (Addendum)
[place] VALVE TEAM  Discharge Summary    Patient ID: Jordan [name] MRN: [id number]; DOB: 10-Mar-1951  Admit date: 06/30/2020 Discharge date: 07/03/2020  Primary Care Provider: [name], MD  Primary Cardiologist: Candee Furbish, MD / Dr. Burt Knack & Dr. Cyndia Bent (TAVR)  Discharge Diagnoses    Principal Problem:   S/P TAVR (transcatheter aortic valve replacement) Active Problems:   Essential hypertension, benign   Hx of TIA (transient ischemic attack) and stroke   HLD (hyperlipidemia)   Severe obesity (BMI >= 40) (HCC)   PAD (peripheral artery disease) ([place])   Chronic venous insufficiency   Tobacco abuse   Protein-calorie malnutrition, severe   Diabetes mellitus without complication (HCC)   COPD (chronic obstructive pulmonary disease) (HCC)   CAD (coronary artery disease)   Severe aortic stenosis   GERD (gastroesophageal reflux disease)   Allergies Allergies  Allergen Reactions  . Altace [Ramipril] Other (See Comments)    Dizziness, migraine, weakness Dye in it  . Codeine Itching  . Naproxen Itching    Diagnostic Studies/Procedures    TAVR OPERATIVE NOTE   Date of Procedure:                06/30/2020  Preoperative Diagnosis:      Severe Aortic Stenosis   Postoperative Diagnosis:    Same   Procedure:        Transcatheter Aortic Valve Replacement - Percutaneous Left Transfemoral Approach             Edwards Sapien 3 Ultra THV (size 26 mm, model # 9750TFX, serial # [id number])              Co-Surgeons:                        Gaye Pollack, MD and Sherren Mocha, MD    Anesthesiologist:                  Jenita Seashore, MD  Echocardiographer:              Liane Comber, MD.  Pre-operative Echo Findings: ? Severe aortic stenosis, moderate to severe aortic insufficiency ? Normal left ventricular systolic function  Post-operative Echo Findings: ? No paravalvular leak ? Normal left ventricular systolic  function  _____________    Echo 07/01/20:  IMPRESSIONS  1. Left ventricular ejection fraction, by estimation, is 60 to 65%. The  left ventricle has normal function. There is mild left ventricular  hypertrophy.  2. Left atrial size was mildly dilated.  3. The mitral valve is degenerative. Trivial mitral valve regurgitation.  Moderate mitral annular calcification.  4. Post TAVR with 26 mm Sapien 3 valve on 06/30/20 Trivial PVL picked up  on mostly doppler possibly seen at 11:00 on PSSA images but very trivial  Gradients have increased since implant mean 10->19 mmHg peak 25->35 mmHg  AVA 2.5 cm2 with DVI 0.71. The  aortic valve has been repaired/replaced. Aortic valve regurgitation is  trivial.    History of Present Illness     Jordan [name] is a 70 y.o. male with a history of DMT2, HTN, HLD,OSA on CPAP, chronic venous insufficiency with bilateral lower extremity edema and history of venous stasis ulcers,ongoing tobacco abusewith COPD, arthritis and severe AS who presented to [facility] on 06/30/20 for planned TAVR.   He was admitted in September 2021 with acute hypoxic respiratory failure due to acute diastolic congestive heart failure and aortic valve  disease. He presented with a couple week history of progressive exertional shortness of breath and chest x-ray on admission showed pulmonary edema and bilateral pleural effusions. He improved with IV diuresis which was complicated by acute kidney injury. This resolved and he was maintained on oral Lasix. He had a 2D echocardiogram on 03/04/2020 which had poor windows but reportedly showed mild aortic insufficiency and moderate to severe aortic stenosis with a mean gradient of 36.5 mmHg and a valve area of 0.68 cm. He subsequently had a TEE done on 03/09/2020 which showed a calcified and thickened aortic valve with restricted leaflet opening and a mean gradient of 43 mmHg. There was severe aortic insufficiency. Left ventricular ejection  fraction was 60 to 65%. Cardiac catheterization showed nonobstructive coronary disease.   The patient has been evaluated by the multidisciplinary valve team and felt to have severe, symptomatic aortic stenosis and to be a suitable candidate for TAVR, which was set up for 06/30/20 (pushed out given need for dental extractions and well as treatment of bilateral groin yeast infections).  Hospital Course     Consultants: none  Severe AS: s/p successful TAVR with a 26 mm Edwards Sapien 3 Ultra THV via the TF approach on 06/30/20. Post operative echo showed EF 60%, normally functioning TAVR with a mean gradient of 19 mm hg and no PVL (gradients noted to be more increased than post deployment). Groin sites are stable. ECG with sinus and no high grade heart block. Continue Asprin and started on plavix 75mg  daily. Plan for discharge home today with close follow up in the office next week.   AKI: creat up from 1.18--> 1.82. Lasix held. Now trending down. Will continue to hold today and resume tomorrow. I will recheck a bmet next week   GERD: Prilosec was changed to Protonix given potential drug drug interaction with Plavix   HTN: resumed on home antihypertensives.   Anemia: Hg stable ~ 10.3 _____________  Discharge Vitals Blood pressure (!) 140/45, pulse (!) 50, temperature 97.6 F (36.4 C), temperature source Oral, resp. rate 16, height 6\' 1"  (1.854 m), weight 83.6 kg, SpO2 96 %.  Filed Weights   07/01/20 0309 07/02/20 0300 07/03/20 0554  Weight: 79.2 kg 84.6 kg 83.6 kg    GEN: Well nourished, well developed, in no acute distress, appears older than stated age 61: normal Neck: no JVD or masses Cardiac: RRR; no murmurs, rubs, or gallops.  Respiratory:  clear to auscultation bilaterally, normal work of breathing GI: soft, nontender, nondistended, + BS MS: no deformity or atrophy Skin: warm and dry, no rash. Chronic edema and venous stasis of legs.  Groin sites clear without hematoma or  ecchymosis  Neuro:  Alert and Oriented x 3, Strength and sensation are intact Psych: euthymic mood, full affect    Labs & Radiologic Studies    CBC Recent Labs    07/02/20 0101 07/03/20 0254  WBC 8.1 7.7  HGB 9.2* 10.3*  HCT 29.1* 32.4*  MCV 88.2 87.8  PLT 142* [id number]   Basic Metabolic Panel Recent Labs    07/01/20 0153 07/02/20 0101 07/03/20 0254  NA 134* 132* 134*  K 4.8 4.8 4.4  CL 102 102 102  CO2 23 20* 22  GLUCOSE 92 79 79  BUN 23 33* 31*  CREATININE 1.18 1.82* 1.55*  CALCIUM 8.3* 8.0* 8.4*  MG 1.7  --   --    Liver Function Tests No results for input(s): AST, ALT, ALKPHOS, BILITOT, PROT, ALBUMIN in the last  72 hours. No results for input(s): LIPASE, AMYLASE in the last 72 hours. Cardiac Enzymes No results for input(s): CKTOTAL, CKMB, CKMBINDEX, TROPONINI in the last 72 hours. BNP Invalid input(s): POCBNP D-Dimer No results for input(s): DDIMER in the last 72 hours. Hemoglobin A1C No results for input(s): HGBA1C in the last 72 hours. Fasting Lipid Panel No results for input(s): CHOL, HDL, LDLCALC, TRIG, CHOLHDL, LDLDIRECT in the last 72 hours. Thyroid Function Tests No results for input(s): TSH, T4TOTAL, T3FREE, THYROIDAB in the last 72 hours.  Invalid input(s): FREET3 _____________  DG Chest 2 View  Result Date: 06/26/2020 CLINICAL DATA:  Severe aortic stenosis, history CHF, COPD, diabetes mellitus, hypertension EXAM: CHEST - 2 VIEW COMPARISON:  06/05/2020 Correlation: CT angio chest 03/07/2020 FINDINGS: Upper normal size of cardiac silhouette with pulmonary vascular congestion. Mediastinal contours normal. Atherosclerotic calcification aorta. Probable nipple shadows, unchanged; no pulmonary nodule seen on earlier CT exam. Chronic bronchitic changes and emphysematous changes consistent with COPD. No pulmonary pulmonary infiltrate, pleural effusion or pneumothorax. Bones demineralized. IMPRESSION: COPD changes. No acute abnormalities. Again identified probable  BILATERAL nipple shadows. Electronically Signed   By: Lavonia Dana M.D.   On: 06/26/2020 17:49   DG Chest 2 View  Result Date: 06/05/2020 CLINICAL DATA:  Preop for cardiac valve replacement. EXAM: CHEST - 2 VIEW COMPARISON:  March 09, 2020. FINDINGS: Stable cardiomediastinal silhouette. No pneumothorax is noted. Hyperexpansion of the lungs is noted. Small right pleural effusion may be present. No consolidative process is noted. Bony thorax is unremarkable. IMPRESSION: Hyperexpansion of the lungs. Small right pleural effusion may be present. Electronically Signed   By: Marijo Conception M.D.   On: 06/05/2020 15:28   ECHOCARDIOGRAM LIMITED  Result Date: 07/01/2020    ECHOCARDIOGRAM LIMITED REPORT   Patient Name:   Jordan [name] Date of Exam: 07/01/2020 Medical Rec #:  [id number]         Height:       73.0 in Accession #:    [id number]        Weight:       174.7 lb Date of Birth:  12/23/1950         BSA:          2.031 m Patient Age:    33 years          BP:           109/62 mmHg Patient Gender: M                 HR:           65 bpm. Exam Location:  Inpatient Procedure: Limited Echo, Color Doppler and Cardiac Doppler Indications:    Post-TAVR Evaluation z95.2  History:        Patient has prior history of Echocardiogram examinations, most                 recent 06/30/2020. Risk Factors:Hypertension, Diabetes and                 Dyslipidemia.  Sonographer:    Raquel Sarna Senior RDCS Referring Phys: [id number] Eileen Stanford  Sonographer Comments: 06/30/20 43mm Edwards Sapien 3 Ultra TAVR IMPRESSIONS  1. Left ventricular ejection fraction, by estimation, is 60 to 65%. The left ventricle has normal function. There is mild left ventricular hypertrophy.  2. Left atrial size was mildly dilated.  3. The mitral valve is degenerative. Trivial mitral valve regurgitation. Moderate mitral annular calcification.  4. Post TAVR with 26 mm Sapien  3 valve on 06/30/20 Trivial PVL picked up on mostly doppler possibly seen at 11:00 on  PSSA images but very trivial Gradients have increased since implant mean 10->19 mmHg peak 25->35 mmHg AVA 2.5 cm2 with DVI 0.71. The aortic valve has been repaired/replaced. Aortic valve regurgitation is trivial. FINDINGS  Left Ventricle: Left ventricular ejection fraction, by estimation, is 60 to 65%. The left ventricle has normal function. The left ventricular internal cavity size was normal in size. There is mild left ventricular hypertrophy. Left Atrium: Left atrial size was mildly dilated. Pericardium: There is no evidence of pericardial effusion. Mitral Valve: The mitral valve is degenerative in appearance. There is mild thickening of the mitral valve leaflet(s). There is mild calcification of the mitral valve leaflet(s). Moderate mitral annular calcification. Trivial mitral valve regurgitation. Aortic Valve: Post TAVR with 26 mm Sapien 3 valve on 06/30/20 Trivial PVL picked up on mostly doppler possibly seen at 11:00 on PSSA images but very trivial Gradients have increased since implant mean 10->19 mmHg peak 25->35 mmHg AVA 2.5 cm2 with DVI 0.71. The aortic valve has been repaired/replaced. Aortic valve regurgitation is trivial. Aortic regurgitation PHT measures 435 msec. Aortic valve mean gradient measures 19.0 mmHg. Aortic valve peak gradient measures 34.8 mmHg. Aortic valve area, by VTI measures 2.72 cm. LEFT VENTRICLE PLAX 2D LVOT diam:     2.20 cm LV SV:         159 LV SV Index:   78 LVOT Area:     3.80 cm  AORTIC VALVE AV Area (Vmax):    2.46 cm AV Area (Vmean):   2.55 cm AV Area (VTI):     2.72 cm AV Vmax:           295.00 cm/s AV Vmean:          204.000 cm/s AV VTI:            0.585 m AV Peak Grad:      34.8 mmHg AV Mean Grad:      19.0 mmHg LVOT Vmax:         191.00 cm/s LVOT Vmean:        137.000 cm/s LVOT VTI:          0.418 m LVOT/AV VTI ratio: 0.71 AI PHT:            435 msec  SHUNTS Systemic VTI:  0.42 m Systemic Diam: 2.20 cm Jenkins Rouge MD Electronically signed by Jenkins Rouge MD  Signature Date/Time: 07/01/2020/6:25:23 PM    Final    ECHOCARDIOGRAM LIMITED  Result Date: 06/30/2020    ECHOCARDIOGRAM LIMITED REPORT   Patient Name:   Jordan [name] Date of Exam: 06/30/2020 Medical Rec #:  [id number]         Height:       73.0 in Accession #:    [id number]        Weight:       186.0 lb Date of Birth:  [birth year]         BSA:          2.086 m Patient Age:    62 years          BP:           147/51 mmHg Patient Gender: M                 HR:           63 bpm. Exam Location:  Inpatient Procedure: Limited Echo, Color Doppler and  Cardiac Doppler Indications:     Aortic Stenosis i35.0  History:         Patient has prior history of Echocardiogram examinations, most                  recent 03/09/2020. Risk Factors:Hypertension, Diabetes and                  Dyslipidemia.  Sonographer:     Raquel Sarna Senior RDCS Referring Phys:  [place] Diagnosing Phys: [name] MD  Sonographer Comments: 61mm Edwards Sapien 3 Ultra TAVR IMPRESSIONS  1. Periprocedural TTE during a TAVR procedure. A 26 mm Edwards-SAPIEN 3 Ultra valve was successfully deployed in the aortic position with improvement of transaortic peak/mean gradients from 58/37 mmHg to 25/10 mmHg. Severe aortic regurgitation has resolved post deployment. There was no paravalvular leak post procedure, no pericardial effusion.  2. Left ventricular ejection fraction, by estimation, is 55 to 60%. The left ventricle has normal function. There is mild concentric left ventricular hypertrophy.  3. Right ventricular systolic function is normal. The right ventricular size is normal.  4. Left atrial size was mildly dilated.  5. The mitral valve is degenerative. Trivial mitral valve regurgitation. No evidence of mitral stenosis. Moderate mitral annular calcification.  6. The aortic valve is tricuspid. There is severe calcifcation of the aortic valve. There is severe thickening of the aortic valve. Aortic valve regurgitation is moderate to severe. Severe  aortic valve stenosis. Aortic valve mean gradient measures 37.0 mmHg. FINDINGS  Left Ventricle: Left ventricular ejection fraction, by estimation, is 55 to 60%. The left ventricle has normal function. There is mild concentric left ventricular hypertrophy. Right Ventricle: The right ventricular size is normal. Right ventricular systolic function is normal. Left Atrium: Left atrial size was mildly dilated. Right Atrium: Right atrial size was normal in size. Pericardium: There is no evidence of pericardial effusion. Mitral Valve: The mitral valve is degenerative in appearance. There is mild thickening of the mitral valve leaflet(s). Moderate mitral annular calcification. Trivial mitral valve regurgitation. No evidence of mitral valve stenosis. Tricuspid Valve: The tricuspid valve is grossly normal. Tricuspid valve regurgitation is mild . No evidence of tricuspid stenosis. Aortic Valve: The aortic valve is tricuspid. There is severe calcifcation of the aortic valve. There is severe thickening of the aortic valve. Aortic valve regurgitation is moderate to severe. Severe aortic stenosis is present. Aortic valve mean gradient  measures 37.0 mmHg. Aortic valve peak gradient measures 24.6 mmHg. Aortic valve area, by VTI measures 1.48 cm. LEFT VENTRICLE PLAX 2D LVOT diam:     1.80 cm LV SV:         72 LV SV Index:   34 LVOT Area:     2.54 cm  AORTIC VALVE AV Area (Vmax):    1.26 cm AV Area (Vmean):   1.37 cm AV Area (VTI):     1.48 cm AV Vmax:           248.00 cm/s AV Vmean:          139.000 cm/s AV VTI:            0.482 m AV Peak Grad:      24.6 mmHg AV Mean Grad:      37.0 mmHg LVOT Vmax:         123.00 cm/s LVOT Vmean:        75.100 cm/s LVOT VTI:          0.281 m LVOT/AV VTI ratio: 0.58  SHUNTS Systemic VTI:  0.28 m Systemic Diam: 1.80 cm [name] MD Electronically signed by [name] MD Signature Date/Time: 06/30/2020/1:52:01 PM    Final (Updated)    Structural Heart Procedure  Result Date:  06/30/2020 See surgical note for result.  Disposition   Pt is being discharged home today in good condition.  Follow-up Plans & Appointments     Follow-up Information    Eileen Stanford, PA-C. Go on 07/09/2020.   Specialties: Cardiology, Radiology Why: @ 1:30pm, please arrive at least 10 minutes early.  Contact information: [address] [place] [address] [phone]                Discharge Medications   Allergies as of 07/03/2020      Reactions   Altace [ramipril] Other (See Comments)   Dizziness, migraine, weakness Dye in it   Codeine Itching   Naproxen Itching      Medication List    STOP taking these medications   fluconazole 100 MG tablet Commonly known as: Diflucan   omeprazole 20 MG capsule Commonly known as: PRILOSEC Replaced by: pantoprazole 40 MG tablet     TAKE these medications   ascorbic acid 500 MG tablet Commonly known as: VITAMIN C Take 500 mg by mouth daily.   aspirin 81 MG tablet Take 81 mg by mouth daily.   atenolol 25 MG tablet Commonly known as: TENORMIN Take 1 tablet (25 mg total) by mouth daily.   atorvastatin 80 MG tablet Commonly known as: LIPITOR Take 80 mg by mouth daily after supper.   budesonide-formoterol 160-4.5 MCG/ACT inhaler Commonly known as: Symbicort Inhale 2 puffs into the lungs in the morning and at bedtime.   butalbital-acetaminophen-caffeine 50-325-40 MG tablet Commonly known as: FIORICET Take 1 tablet by mouth 2 (two) times daily as needed for migraine.   clopidogrel 75 MG tablet Commonly known as: PLAVIX Take 1 tablet (75 mg total) by mouth daily with breakfast.   folic acid 1 MG tablet Commonly known as: FOLVITE Take 1 tablet (1 mg total) by mouth daily.   furosemide 40 MG tablet Commonly known as: LASIX Take 1 tablet (40 mg total) by mouth daily.   hydrALAZINE 50 MG tablet Commonly known as: APRESOLINE Take 1 tablet (50 mg total) by mouth every 8 (eight) hours.    ipratropium-albuterol 0.5-2.5 (3) MG/3ML Soln Commonly known as: DUONEB Take 3 mLs by nebulization every 6 (six) hours as needed (shortness of breath/wheezing).   LORazepam 1 MG tablet Commonly known as: ATIVAN Take 0.5 tablets (0.5 mg total) by mouth 2 (two) times daily as needed for anxiety.   methocarbamol 750 MG tablet Commonly known as: ROBAXIN Take 750 mg by mouth as needed.   montelukast 10 MG tablet Commonly known as: SINGULAIR Take 10 mg by mouth daily.   multivitamin with minerals Tabs tablet Take 1 tablet by mouth daily.   nystatin cream Commonly known as: MYCOSTATIN Apply 1 application topically 2 (two) times daily.   oxyCODONE-acetaminophen 5-325 MG tablet Commonly known as: Percocet Take 1 tablet by mouth every 4 (four) hours as needed.   pantoprazole 40 MG tablet Commonly known as: PROTONIX Take 1 tablet (40 mg total) by mouth daily. Replaces: omeprazole 20 MG capsule   promethazine 25 MG tablet Commonly known as: PHENERGAN Take 25 mg by mouth every 8 (eight) hours as needed for nausea/vomiting.   venlafaxine XR 75 MG 24 hr capsule Commonly known as: EFFEXOR-XR Take 75 mg by mouth in  the morning and at bedtime. MUST HAVE BRAND NAME ONLY           Outstanding Labs/Studies   BMET  Duration of Discharge Encounter   Greater than 30 minutes including physician time.  Signed, Angelena Form, PA-C 07/03/2020, 6:58 AM [phone]    Chart reviewed, patient examined, agree with above. He feels well and is ambulating without difficulty. Rhythm has been stable sinus brady, unchanged from preop with no heart block. Groin sites look good. Creatinine coming back down.  Plan home today.

## 2020-07-03 NOTE — Progress Notes (Signed)
Discharge orders received, IV and telemetry discontinued. CCMD notified. Pt assisted with getting dressed.  Discharge instructions reviewed, pt expresses understanding. He is awaiting ride who is expected to be here around noon.

## 2020-07-03 NOTE — Progress Notes (Signed)
CARDIAC REHAB PHASE I   Offered to walk with pt. Pt recently received breakfast tray. Pt educated on importance of site care and monitoring incision daily. Reviewed restrictions and encouraged ambulation as able with emphasis on safety. Pt denies further questions or concerns. For d/c today. Not appropriate for CRP II at this time.  [id number] Rufina Falco, RN BSN 07/03/2020 8:28 AM

## 2020-07-03 NOTE — Discharge Instructions (Signed)
Prilosec was changed to Protonix given potential drug drug interaction with Plavix. You can go back on prilosec when you finish 6 months of plavix.    ACTIVITY AND EXERCISE  Daily activity and exercise are an important part of your recovery. People recover at different rates depending on their general health and type of valve procedure.  Most people recovering from TAVR feel better relatively quickly   No lifting, pushing, pulling more than 10 pounds (examples to avoid: groceries, vacuuming, gardening, golfing):             - For one week with a procedure through the groin.             - For six weeks for procedures through the chest wall or neck. NOTE: You will typically see one of our providers 7-14 days after your procedure to discuss [place] the above activities.      DRIVING  Do not drive until you are seen for follow up and cleared by a provider. Generally, we ask patient to not drive for 1 week after their procedure.  If you have been told by your doctor in the past that you may not drive, you must talk with him/her before you begin driving again.   DRESSING  Groin site: you may leave the clear dressing over the site for up to one week or until it falls off.   HYGIENE  If you had a femoral (leg) procedure, you may take a shower when you return home. After the shower, pat the site dry. Do NOT use powder, oils or lotions in your groin area until the site has completely healed.  If you had a chest procedure, you may shower when you return home unless specifically instructed not to by your discharging practitioner.             - DO NOT scrub incision; pat dry with a towel.             - DO NOT apply any lotions, oils, powders to the incision.             - No tub baths / swimming for at least 2 weeks.  If you notice any fevers, chills, increased pain, swelling, bleeding or pus, please contact your doctor.   ADDITIONAL INFORMATION  If you are going to have an upcoming  dental procedure, please contact our office as you will require antibiotics ahead of time to prevent infection on your heart valve.    If you have any questions or concerns you can call the structural heart phone during normal business hours 8am-4pm. If you have an urgent need after hours or weekends please call [phone] to talk to the on call provider for general cardiology. If you have an emergency that requires immediate attention, please call 911.    After TAVR Checklist  Check  Test Description   Follow up appointment in 1-2 weeks  You will see our structural heart physician assistant, Nell Range. Your incision sites will be checked and you will be cleared to drive and resume all normal activities if you are doing well.     1 month echo and follow up  You will have an echo to check on your new heart valve and be seen back in the office by Nell Range. Many times the echo is not read by your appointment time, but Joellen Jersey will call you later that day or the following day to report your results.   Follow  up with your primary cardiologist You will need to be seen by your primary cardiologist in the following 3-6 months after your 1 month appointment in the valve clinic. Often times your Plavix or Aspirin will be discontinued during this time, but this is decided on a case by case basis.    1 year echo and follow up You will have another echo to check on your heart valve after 1 year and be seen back in the office by Nell Range. This your last structural heart visit.   Bacterial endocarditis prophylaxis  You will have to take antibiotics for the rest of your life before all dental procedures (even teeth cleanings) to protect your heart valve. Antibiotics are also required before some surgeries. Please check with your cardiologist before scheduling any surgeries. Also, please make sure to tell us if you have a penicillin allergy as you will require an alternative antibiotic.

## 2020-07-06 ENCOUNTER — Telehealth: Payer: Self-pay

## 2020-07-06 NOTE — Telephone Encounter (Signed)
Patient contacted regarding discharge from [facility] on 07/03/2020.  Patient understands to follow up with provider Nell Range PA-C on 07/09/2020 at 1:30 PM at [facility] location. Patient understands discharge instructions? yes Patient understands medications and regiment? yes Patient understands to bring all medications to this visit? Yes  The pt is doing well since discharge. He has no questions or concerns at this time.

## 2020-07-09 ENCOUNTER — Encounter: Payer: Self-pay | Admitting: Physician Assistant

## 2020-07-09 ENCOUNTER — Ambulatory Visit: Payer: Medicare PPO | Admitting: Physician Assistant

## 2020-07-09 DIAGNOSIS — I872 Venous insufficiency (chronic) (peripheral): Secondary | ICD-10-CM | POA: Diagnosis not present

## 2020-07-09 NOTE — Progress Notes (Signed)
Office Visit Note   Patient: Jordan [name]           Date of Birth: 10/05/1950           MRN: [id number] Visit Date: 07/09/2020              Requested by: [name], MD [place],  [place] [id number] PCP: [name], MD  No chief complaint on file.     HPI: Patient is a pleasant 70 year old gentleman who we have been following for a year for chronic bilateral venous insufficiency.  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  He actually was doing well and was coming in just once monthly for a check of his legs.  He was using his vive compression socks.  Unfortunately he was recently hospitalized for heart surgery and during this.  He had significant increased swelling in his bilateral lower extremities.  They are now weeping and significantly enlarged he cannot wear his socks  Assessment & Plan: Visit Diagnoses: No diagnosis found.  Plan: Patient will follow-up twice weekly for wrapping until we could safely put him back in his socks  Follow-Up Instructions: No follow-ups on file.   Ortho Exam  Patient is alert, oriented, no adenopathy, well-dressed, normal affect, normal respiratory effort. Bilateral lower extremities significant swelling and weeping no specific ulcers but there are areas of spotty bleeding.  No ascending cellulitis.  No necrosis noted.  No foul odor.  Imaging: No results found. No images are attached to the encounter.  Labs: Lab Results  Component Value Date   HGBA1C 5.3 03/05/2020   HGBA1C 5.7 (H) 03/14/2017   HGBA1C 5.0 10/30/2014   ESRSEDRATE 28 (H) 10/30/2014   CRP 6.5 (H) 10/30/2014   REPTSTATUS 03/19/2017 FINAL 03/14/2017   CULT  03/14/2017    NO GROWTH 5 DAYS Performed at [facility], [place] [address] [place],  [address]      Lab Results  Component Value Date   ALBUMIN 3.3 (L) 06/26/2020   ALBUMIN 3.1 (L) 06/05/2020   ALBUMIN 3.1 (L) 03/03/2020    PREALBUMIN 9.4 (L) 10/30/2014    Lab Results  Component Value Date   MG 1.7 07/01/2020   MG 1.8 03/12/2020   MG 1.8 03/05/2020   No results found for: [facility]  Lab Results  Component Value Date   PREALBUMIN 9.4 (L) 10/30/2014   CBC EXTENDED Latest Ref Rng & Units 07/03/2020 07/02/2020 07/01/2020  WBC 4.0 - 10.5 K/uL 7.7 8.1 11.9(H)  RBC 4.22 - 5.81 MIL/uL 3.69(L) 3.30(L) 3.64(L)  HGB 13.0 - 17.0 g/dL 10.3(L) 9.2(L) 10.3(L)  HCT 39.0 - 52.0 % 32.4(L) 29.1(L) 32.0(L)  PLT 150 - 400 K/uL 169 142(L) 177  NEUTROABS 1.7 - 7.7 K/uL - - -  LYMPHSABS 0.7 - 4.0 K/uL - - -     There is no height or weight on file to calculate BMI.  Orders:  No orders of the defined types were placed in this encounter.  No orders of the defined types were placed in this encounter.    Procedures: No procedures performed  Clinical Data: No additional findings.  ROS:  All other systems negative, except as noted in the HPI. Review of Systems  Objective: Vital Signs: There were no vitals taken for this visit.  Specialty Comments:  No specialty comments available.  PMFS History: Patient Active Problem List   Diagnosis Date Noted  .  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 Diabetes mellitus without complication ([place])   . COPD (chronic obstructive pulmonary disease) ([place])   . CAD (coronary artery disease)   . Severe aortic stenosis   . GERD (gastroesophageal reflux disease)   . Protein-calorie malnutrition, severe 03/05/2020  . Tobacco abuse 03/03/2020  . Pressure injury of skin 03/15/2017  . Chronic venous insufficiency 04/28/2014  . PAD (peripheral artery disease) ([place]) 01/21/2014  . Varicose veins of lower extremities with other complications 76/16/0737  . Hx of TIA (transient ischemic attack) and stroke 12/31/2013  . HLD (hyperlipidemia) 12/31/2013  . Severe obesity (BMI >= 40) ([place]) 12/31/2013  . Essential hypertension, benign 09/13/2012  . Edema 09/13/2012    Past Medical History:  Diagnosis Date  . Anxiety   . Arthritis    knee and neck  . CAD (coronary artery disease)    cath 9/21: LAD calcified, pLCx 21, mRCA 30, dRCA 30  . Chronic diastolic CHF   . COPD (chronic obstructive pulmonary disease) ([place])   . Depression   . Diabetes mellitus without complication ([place])   . GERD (gastroesophageal reflux disease)   . Hyperlipidemia   . Hypertension   . Migraine   . Obesity   . 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 Burt Knack and Dr. Cyndia Bent  . Severe aortic stenosis    Severe aortic insufficiency, moderate to severe aortic stenosis  . Sleep apnea    CPAP   . Venous insufficiency    managed by ortho (Dr. Sharol Given)    Family History  Problem Relation Age of Onset  . Hypertension Mother   . Alzheimer's disease Mother   . Stroke Father        x 3  . Heart attack Father   . Congestive Heart Failure Father   . Diabetes Other        mat great aunts  . Colon cancer Neg Hx   . Rectal cancer Neg Hx   . Esophageal cancer Neg Hx     Past Surgical History:  Procedure Laterality Date  . ANKLE SURGERY Right    Dr. Marcelino Scot, has pins  . COLONOSCOPY W/ POLYPECTOMY    . KNEE ARTHROSCOPY Right    Dr. Noemi Chapel  . MULTIPLE EXTRACTIONS WITH ALVEOLOPLASTY Bilateral 05/22/2020   Procedure: MULTIPLE EXTRACTION WITH ALVEOLOPLASTY;  Surgeon: Diona Browner, DMD;  Location: [place];  Service: Oral Surgery;  Laterality: Bilateral;  . right foot surgery,rt great toe callus removal    . RIGHT/LEFT HEART CATH AND CORONARY ANGIOGRAPHY N/A 03/06/2020   Procedure: RIGHT/LEFT HEART CATH AND CORONARY ANGIOGRAPHY;  Surgeon: Sherren Mocha, MD;  Location: [place] [facility];  Service: Cardiovascular;  Laterality: N/A;  . TEE WITHOUT CARDIOVERSION N/A 03/09/2020   Procedure: TRANSESOPHAGEAL ECHOCARDIOGRAM (TEE);  Surgeon: Dorothy Spark, MD;  Location: [facility] ENDOSCOPY;  Service: Cardiovascular;   Laterality: N/A;  . TEE WITHOUT CARDIOVERSION N/A 06/30/2020   Procedure: TRANSESOPHAGEAL ECHOCARDIOGRAM (TEE);  Surgeon: Sherren Mocha, MD;  Location: [place];  Service: Open Heart Surgery;  Laterality: N/A;  . TONSILLECTOMY AND ADENOIDECTOMY     age 72  . TRANSCATHETER AORTIC VALVE REPLACEMENT, TRANSFEMORAL N/A 06/30/2020   Procedure: TRANSCATHETER AORTIC VALVE REPLACEMENT, TRANSFEMORAL;  Surgeon: Sherren Mocha, MD;  Location: [place];  Service: Open Heart Surgery;  Laterality: N/A;   Social History   Occupational History  . Occupation: retired Corporate investment banker  Employer: RETIRED  Tobacco Use  . Smoking status: Current Some Day Smoker    Packs/day: 0.75    Years: 55.00    Pack years: 41.25    Types: Cigarettes  . Smokeless tobacco: Never Used  Vaping Use  . Vaping Use: Never used  Substance and Sexual Activity  . Alcohol use: No    Alcohol/week: 0.0 standard drinks  . Drug use: No  . Sexual activity: Yes    Birth control/protection: Condom

## 2020-07-10 ENCOUNTER — Telehealth: Payer: Self-pay | Admitting: Physician Assistant

## 2020-07-10 NOTE — Telephone Encounter (Signed)
*  STAT* If patient is at the pharmacy, call can be transferred to refill team.   1. Which medications need to be refilled? (please list name of each medication and dose if known)  clopidogrel (PLAVIX) 75 MG tablet pantoprazole (PROTONIX) 40 MG tablet  2. Which pharmacy/location (including street and city if local pharmacy) is medication to be sent to? [id number] - [place], [place] [address]  3. Do they need a 30 day or 90 day supply? 90  Patient said that no rx was ever called in to his pharmacy. He has not started taking this medication yet. Pt said [facility] be [phone] not what is in the chart

## 2020-07-10 NOTE — Telephone Encounter (Signed)
Called [facility]'s pharmacy [facility] and they stated that pt picked up both medication on the 19th of January 2022 for a 90 day supply. I called pt to inform him of this and pt stated that he did not recall picking up the medication. I advised the pt that he needed to call his pharmacy again and speak with a live person to discuss this matter and if he needed to give our office a call back, to do so and we will find out what else he needs to do. Pt verbalized understanding.

## 2020-07-13 ENCOUNTER — Ambulatory Visit: Payer: Medicare PPO | Admitting: Physician Assistant

## 2020-07-13 ENCOUNTER — Encounter: Payer: Self-pay | Admitting: Orthopedic Surgery

## 2020-07-13 DIAGNOSIS — I872 Venous insufficiency (chronic) (peripheral): Secondary | ICD-10-CM

## 2020-07-13 NOTE — Progress Notes (Signed)
Office Visit Note   Patient: Jordan [name]           Date of Birth: [birth year]           MRN: [id number] Visit Date: 07/13/2020              Requested by: [name], [place],  [place] [id number] PCP: [name], MD  Chief Complaint  Patient presents with  . Right Leg - Follow-up  . Left Leg - Follow-up      HPI: Patient follows up today for his bilateral venous insufficiency.  He had done really well and was in socks but then unfortunately was hospitalized and had significant Swelling return.  We did wrap him last week he is here for follow-up also asking to have his nails trimmed  Assessment & Plan: Visit Diagnoses: No diagnosis found.  Plan: We will wrap him for 1 final time.  Next week we will transition him to socks.  I am concerned about some swelling in his second toe on the right.  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  Do not see any infection or ascending cellulitis at this time.  Follow-Up Instructions: No follow-ups on file.   Ortho Exam  Patient is alert, oriented, no adenopathy, well-dressed, normal affect, normal respiratory effort. Bilateral lower extremities he does have some venous stasis ulcers on the right but no ascending cellulitis.  Swelling on the right is gone down more on the left but both show good wrinkling of the skin.  Left side has no ascending cellulitis.  He does have significant buildup of debris between his toes this was cleaned out and his toes were trimmed to a more stable surface.  He does have chronic changes in both great toes that have been previously noted.  Right second does have some sausage appearance but no ulcers or open areas.  On the left second toe he does have some wearing down at the tip of the toe but cannot probe to bone.  Neither side has ascending cellulitis  Imaging: No results found. No images are attached to the encounter.  Labs: Lab Results  Component Value Date    HGBA1C 5.3 03/05/2020   HGBA1C 5.7 (H) 03/14/2017   HGBA1C 5.0 10/30/2014   ESRSEDRATE 28 (H) 10/30/2014   CRP 6.5 (H) 10/30/2014   REPTSTATUS 03/19/2017 FINAL 03/14/2017   CULT  03/14/2017    NO GROWTH 5 DAYS Performed at [facility], [place] [address], [place], [place] [address]      Lab Results  Component Value Date   ALBUMIN 3.3 (L) 06/26/2020   ALBUMIN 3.1 (L) 06/05/2020   ALBUMIN 3.1 (L) 03/03/2020   PREALBUMIN 9.4 (L) 10/30/2014    Lab Results  Component Value Date   MG 1.7 07/01/2020   MG 1.8 03/12/2020   MG 1.8 03/05/2020   No results found for: [facility]  Lab Results  Component Value Date   PREALBUMIN 9.4 (L) 10/30/2014   CBC EXTENDED Latest Ref Rng & Units 07/03/2020 07/02/2020 07/01/2020  WBC 4.0 - 10.5 K/uL 7.7 8.1 11.9(H)  RBC 4.22 - 5.81 MIL/uL 3.69(L) 3.30(L) 3.64(L)  HGB 13.0 - 17.0 g/dL 10.3(L) 9.2(L) 10.3(L)  HCT 39.0 - 52.0 % 32.4(L) 29.1(L) 32.0(L)  PLT 150 - 400 K/uL 169 142(L) 177  NEUTROABS 1.7 - 7.7 K/uL - - -  LYMPHSABS 0.7 - 4.0 K/uL - - -     There is no  height or weight on file to calculate BMI.  Orders:  No orders of the defined types were placed in this encounter.  No orders of the defined types were placed in this encounter.    Procedures: No procedures performed  Clinical Data: No additional findings.  ROS:  All other systems negative, except as noted in the HPI. Review of Systems  Objective: [name] Signs: There were no vitals taken for this visit.  Specialty Comments:  No specialty comments available.  PMFS History: Patient Active Problem List   Diagnosis Date Noted  . 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 Diabetes mellitus without complication ([place])   . COPD (chronic obstructive pulmonary disease) ([place])   . CAD (coronary artery disease)   . Severe aortic stenosis   . GERD (gastroesophageal reflux disease)   . Protein-calorie malnutrition, severe 03/05/2020  . Tobacco abuse  03/03/2020  . Pressure injury of skin 03/15/2017  . Chronic venous insufficiency 04/28/2014  . PAD (peripheral artery disease) ([place]) 01/21/2014  . Varicose veins of lower extremities with other complications [id number]  . Hx of TIA (transient ischemic attack) and stroke 12/31/2013  . HLD (hyperlipidemia) 12/31/2013  . Severe obesity (BMI >= 40) ([place]) 12/31/2013  . Essential hypertension, benign 09/13/2012  . Edema 09/13/2012   Past Medical History:  Diagnosis Date  . Anxiety   . Arthritis    knee and neck  . CAD (coronary artery disease)    cath 9/21: LAD calcified, pLCx 23, mRCA 30, dRCA 30  . Chronic diastolic CHF   . COPD (chronic obstructive pulmonary disease) ([place])   . Depression   . Diabetes mellitus without complication ([place])   . GERD (gastroesophageal reflux disease)   . Hyperlipidemia   . Hypertension   . Migraine   . Obesity   . 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 Burt Knack and Dr. Cyndia Bent  . Severe aortic stenosis    Severe aortic insufficiency, moderate to severe aortic stenosis  . Sleep apnea    CPAP   . Venous insufficiency    managed by ortho (Dr. Sharol Given)    Family History  Problem Relation Age of Onset  . Hypertension Mother   . Alzheimer's disease Mother   . Stroke Father        x 3  . Heart attack Father   . Congestive Heart Failure Father   . Diabetes Other        mat great aunts  . Colon cancer Neg Hx   . Rectal cancer Neg Hx   . Esophageal cancer Neg Hx     Past Surgical History:  Procedure Laterality Date  . ANKLE SURGERY Right    Dr. Marcelino Scot, has pins  . COLONOSCOPY W/ POLYPECTOMY    . KNEE ARTHROSCOPY Right    Dr. Noemi Chapel  . MULTIPLE EXTRACTIONS WITH ALVEOLOPLASTY Bilateral 05/22/2020   Procedure: MULTIPLE EXTRACTION WITH ALVEOLOPLASTY;  Surgeon: Diona Browner, DMD;  Location: [place];  Service: Oral Surgery;  Laterality: Bilateral;  . right foot surgery,rt  great toe callus removal    . RIGHT/LEFT HEART CATH AND CORONARY ANGIOGRAPHY N/A 03/06/2020   Procedure: RIGHT/LEFT HEART CATH AND CORONARY ANGIOGRAPHY;  Surgeon: Sherren Mocha, MD;  Location: [place] [facility];  Service: Cardiovascular;  Laterality: N/A;  . TEE WITHOUT CARDIOVERSION N/A 03/09/2020   Procedure: TRANSESOPHAGEAL ECHOCARDIOGRAM (TEE);  Surgeon: Dorothy Spark, MD;  Location: [facility] ENDOSCOPY;  Service: Cardiovascular;  Laterality: N/A;  . TEE WITHOUT CARDIOVERSION N/A 06/30/2020   Procedure: TRANSESOPHAGEAL ECHOCARDIOGRAM (TEE);  Surgeon: Sherren Mocha, MD;  Location: [place];  Service: Open Heart Surgery;  Laterality: N/A;  . TONSILLECTOMY AND ADENOIDECTOMY     age 38  . TRANSCATHETER AORTIC VALVE REPLACEMENT, TRANSFEMORAL N/A 06/30/2020   Procedure: TRANSCATHETER AORTIC VALVE REPLACEMENT, TRANSFEMORAL;  Surgeon: Sherren Mocha, MD;  Location: [place];  Service: Open Heart Surgery;  Laterality: N/A;   Social History   Occupational History  . Occupation: retired Youth worker: RETIRED  Tobacco Use  . Smoking status: Current Some Day Smoker    Packs/day: 0.75    Years: 55.00    Pack years: 41.25    Types: Cigarettes  . Smokeless tobacco: Never Used  Vaping Use  . Vaping Use: Never used  Substance and Sexual Activity  . Alcohol use: No    Alcohol/week: 0.0 standard drinks  . Drug use: No  . Sexual activity: Yes    Birth control/protection: Condom

## 2020-07-23 ENCOUNTER — Other Ambulatory Visit: Payer: Self-pay

## 2020-07-23 ENCOUNTER — Ambulatory Visit (HOSPITAL_COMMUNITY): Payer: Medicare PPO | Attending: Physician Assistant

## 2020-07-23 ENCOUNTER — Ambulatory Visit: Payer: Medicare PPO | Admitting: Physician Assistant

## 2020-07-23 ENCOUNTER — Encounter: Payer: Self-pay | Admitting: Physician Assistant

## 2020-07-23 VITALS — BP 162/56 | HR 62 | Ht 73.0 in | Wt 185.0 lb

## 2020-07-23 DIAGNOSIS — Z952 Presence of prosthetic heart valve: Secondary | ICD-10-CM | POA: Insufficient documentation

## 2020-07-23 DIAGNOSIS — N183 Chronic kidney disease, stage 3 unspecified: Secondary | ICD-10-CM

## 2020-07-23 DIAGNOSIS — R591 Generalized enlarged lymph nodes: Secondary | ICD-10-CM

## 2020-07-23 DIAGNOSIS — I10 Essential (primary) hypertension: Secondary | ICD-10-CM

## 2020-07-23 DIAGNOSIS — R590 Localized enlarged lymph nodes: Secondary | ICD-10-CM

## 2020-07-23 DIAGNOSIS — I872 Venous insufficiency (chronic) (peripheral): Secondary | ICD-10-CM

## 2020-07-23 DIAGNOSIS — R932 Abnormal findings on diagnostic imaging of liver and biliary tract: Secondary | ICD-10-CM

## 2020-07-23 LAB — ECHOCARDIOGRAM COMPLETE
AV Mean grad: 14.6 mmHg
AV Peak grad: 29.6 mmHg
Ao pk vel: 2.72 m/s
Area-P 1/2: 3.37 cm2
P 1/2 time: 523 msec
S' Lateral: 3.5 cm

## 2020-07-23 MED ORDER — HYDRALAZINE HCL 50 MG PO TABS: 75.0000 mg | ORAL_TABLET | Freq: Three times a day (TID) | ORAL | 3 refills | Status: DC

## 2020-07-23 NOTE — Progress Notes (Signed)
HEART AND [address]                                     Cardiology Office Note:    Date:  07/23/2020   ID:  Jordan [name], DOB [birth year], MRN [id number]  PCP:  [name], MD  [facility] Cardiologist:  Candee Furbish, MD / Candee Furbish, MD / Dr. Burt Knack & Dr. Cyndia Bent (TAVR) [place] Electrophysiologist:  None   Referring MD: [name], MD   1 month s/p TAVR  History of Present Illness:    Jordan [name] is a 70 y.o. male with a hx of DMT2, HTN, HLD,OSA on CPAP, chronic venous insufficiency with bilateral lower extremity edema and history of venous stasis ulcers,ongoing tobacco abusewith COPD, arthritis and severe AS s/p TAVR (06/30/20) who presents to clinic for follow up.   He wasadmitted in September 2021 with acute hypoxic respiratory failure due to acute diastolic congestive heart failure and aortic valve disease.He improved with IV diuresis which was complicated by acute kidney injury. This resolved and he was maintained on oral Lasix. He had a 2D echocardiogram on 9/15/2021which had poor windows but reportedly showed mild aortic insufficiency and moderate to severe aortic stenosis with a mean gradient of 36.5 mmHg and a valve area of 0.68 cm. He subsequently had a TEE done on 03/09/2020 which showed a calcified and thickened aortic valve with restricted leaflet opening and a mean gradient of 43 mmHg. There was severe aortic insufficiency. Left ventricular ejection fraction was 60 to 65%. Cardiac catheterization showed nonobstructive coronary disease.  He was evaluated by the multidisciplinary valve team and underwent successful TAVR with a 26 mm Edwards Sapien 3 Ultra THV via the TF approach on 06/30/20. Post operative echo showed EF 60%, normally functioning TAVR with a mean gradient of 19 mm hg and no PVL (gradients noted to be more increased than post deployment). Creat bump a little from 1.18--> 1.8 and lasix  was held. He was discharged on aspirin and plavix. He has had issues with LE edema and weeping and had to get legs wrapped. He is followed closely by ortho PA. His creat was checked on 07/03/20 and down to 1.55.   Today he presents to clinic for follow up. Legs are improving a lot with unna boots. No CP or SOB. No LE edema, orthopnea or PND. No dizziness or syncope. No blood in stool or urine. No palpitations.    Past Medical History:  Diagnosis Date  . Anxiety   . Arthritis    knee and neck  . CAD (coronary artery disease)    cath 9/21: LAD calcified, pLCx 40, mRCA 30, dRCA 30  . Chronic diastolic CHF   . COPD (chronic obstructive pulmonary disease) ([place])   . Depression   . Diabetes mellitus without complication ([place])   . GERD (gastroesophageal reflux disease)   . Hyperlipidemia   . Hypertension   . Migraine   . Obesity   . 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 Burt Knack and Dr. Cyndia Bent  . Severe aortic stenosis    Severe aortic insufficiency, moderate to severe aortic stenosis  . Sleep apnea    CPAP   . Venous insufficiency    managed by ortho (Dr. Sharol Given)    Past Surgical  History:  Procedure Laterality Date  . ANKLE SURGERY Right    Dr. Marcelino Scot, has pins  . COLONOSCOPY W/ POLYPECTOMY    . KNEE ARTHROSCOPY Right    Dr. Noemi Chapel  . MULTIPLE EXTRACTIONS WITH ALVEOLOPLASTY Bilateral 05/22/2020   Procedure: MULTIPLE EXTRACTION WITH ALVEOLOPLASTY;  Surgeon: Diona Browner, DMD;  Location: [place];  Service: Oral Surgery;  Laterality: Bilateral;  . right foot surgery,rt great toe callus removal    . RIGHT/LEFT HEART CATH AND CORONARY ANGIOGRAPHY N/A 03/06/2020   Procedure: RIGHT/LEFT HEART CATH AND CORONARY ANGIOGRAPHY;  Surgeon: Sherren Mocha, MD;  Location: [facility];  Service: Cardiovascular;  Laterality: N/A;  . TEE WITHOUT CARDIOVERSION N/A 03/09/2020   Procedure: TRANSESOPHAGEAL  ECHOCARDIOGRAM (TEE);  Surgeon: Dorothy Spark, MD;  Location: [facility] ENDOSCOPY;  Service: Cardiovascular;  Laterality: N/A;  . TEE WITHOUT CARDIOVERSION N/A 06/30/2020   Procedure: TRANSESOPHAGEAL ECHOCARDIOGRAM (TEE);  Surgeon: Sherren Mocha, MD;  Location: [place];  Service: Open Heart Surgery;  Laterality: N/A;  . TONSILLECTOMY AND ADENOIDECTOMY     age 33  . TRANSCATHETER AORTIC VALVE REPLACEMENT, TRANSFEMORAL N/A 06/30/2020   Procedure: TRANSCATHETER AORTIC VALVE REPLACEMENT, TRANSFEMORAL;  Surgeon: Sherren Mocha, MD;  Location: [place];  Service: Open Heart Surgery;  Laterality: N/A;    Current Medications: Current Meds  Medication Sig  . ascorbic acid (VITAMIN C) 500 MG tablet Take 500 mg by mouth daily.  Marland Kitchen aspirin 81 MG tablet Take 81 mg by mouth daily.  Marland Kitchen atenolol (TENORMIN) 25 MG tablet Take 1 tablet (25 mg total) by mouth daily.  Marland Kitchen atorvastatin (LIPITOR) 80 MG tablet Take 80 mg by mouth daily after supper.   . budesonide-formoterol (SYMBICORT) 160-4.5 MCG/ACT inhaler Inhale 2 puffs into the lungs in the morning and at bedtime.  . butalbital-acetaminophen-caffeine (FIORICET, ESGIC) 50-325-40 MG per tablet Take 1 tablet by mouth 2 (two) times daily as needed for migraine.   . clopidogrel (PLAVIX) 75 MG tablet Take 1 tablet (75 mg total) by mouth daily with breakfast.  . folic acid (FOLVITE) 1 MG tablet Take 1 tablet (1 mg total) by mouth daily.  . furosemide (LASIX) 40 MG tablet Take 1 tablet (40 mg total) by mouth daily.  Marland Kitchen ipratropium-albuterol (DUONEB) 0.5-2.5 (3) MG/3ML SOLN Take 3 mLs by nebulization every 6 (six) hours as needed (shortness of breath/wheezing).  . LORazepam (ATIVAN) 1 MG tablet Take 0.5 tablets (0.5 mg total) by mouth 2 (two) times daily as needed for anxiety.  . methocarbamol (ROBAXIN) 750 MG tablet Take 750 mg by mouth as needed.  . montelukast (SINGULAIR) 10 MG tablet Take 10 mg by mouth daily.  . Multiple Vitamin (MULTIVITAMIN WITH MINERALS) TABS Take 1  tablet by mouth daily.  Marland Kitchen nystatin cream (MYCOSTATIN) Apply 1 application topically 2 (two) times daily.  Marland Kitchen oxyCODONE-acetaminophen (PERCOCET) 5-325 MG tablet Take 1 tablet by mouth every 4 (four) hours as needed.  . pantoprazole (PROTONIX) 40 MG tablet Take 1 tablet (40 mg total) by mouth daily.  . promethazine (PHENERGAN) 25 MG tablet Take 25 mg by mouth every 8 (eight) hours as needed for nausea/vomiting.  . venlafaxine XR (EFFEXOR-XR) 75 MG 24 hr capsule Take 75 mg by mouth in the morning and at bedtime. MUST HAVE BRAND NAME ONLY  . [DISCONTINUED] hydrALAZINE (APRESOLINE) 50 MG tablet Take 1 tablet (50 mg total) by mouth every 8 (eight) hours.     Allergies:   Altace [ramipril], Codeine, and Naproxen   Social History   Socioeconomic History  .  Marital status: Single    Spouse name: Not on file  . Number of children: 0  . Years of education: Not on file  . Highest education level: Not on file  Occupational History  . Occupation: retired Youth worker: RETIRED  Tobacco Use  . Smoking status: Current Some Day Smoker    Packs/day: 0.75    Years: 55.00    Pack years: 41.25    Types: Cigarettes  . Smokeless tobacco: Never Used  Vaping Use  . Vaping Use: Never used  Substance and Sexual Activity  . Alcohol use: No    Alcohol/week: 0.0 standard drinks  . Drug use: No  . Sexual activity: Yes    Birth control/protection: Condom  Other Topics Concern  . Not on file  Social History Narrative  . Not on file   Social Determinants of Health   Financial Resource Strain: Not on file  Food Insecurity: Not on file  Transportation Needs: Not on file  Physical Activity: Not on file  Stress: Not on file  Social Connections: Not on file     Family History: The patient's family history includes Alzheimer's disease in his mother; Congestive Heart Failure in his father; Diabetes in an other family member; Heart attack in his father; Hypertension in his mother;  Stroke in his father. There is no history of Colon cancer, Rectal cancer, or Esophageal cancer.  ROS:   Please see the history of present illness.    All other systems reviewed and are negative.  EKGs/Labs/Other Studies Reviewed:    The following studies were reviewed today: TAVR OPERATIVE NOTE   Date of Procedure:06/30/2020  Preoperative Diagnosis:Severe Aortic Stenosis   Postoperative Diagnosis:Same   Procedure:   Transcatheter Aortic Valve Replacement - PercutaneousLeftTransfemoral Approach Edwards Sapien 3 Ultra THV (size 46mm, model # 9750TFX, serial # [id number])  Co-Surgeons:Bryan Alveria Apley, MD and Sherren Mocha, MD   Anesthesiologist:C. Glennon Mac, MD  Echocardiographer:K. Meda Coffee, MD.  Pre-operative Echo Findings: ? Severe aortic stenosis, moderate to severe aortic insufficiency ? Normalleft ventricular systolic function  Post-operative Echo Findings: ? Noparavalvular leak ? Normalleft ventricular systolic function  _____________    Echo 07/01/20:  IMPRESSIONS  1. Left ventricular ejection fraction, by estimation, is 60 to 65%. The  left ventricle has normal function. There is mild left ventricular  hypertrophy.  2. Left atrial size was mildly dilated.  3. The mitral valve is degenerative. Trivial mitral valve regurgitation.  Moderate mitral annular calcification.  4. Post TAVR with 26 mm Sapien 3 valve on 06/30/20 Trivial PVL picked up  on mostly doppler possibly seen at 11:00 on PSSA images but very trivial  Gradients have increased since implant mean 10->19 mmHg peak 25->35 mmHg  AVA 2.5 cm2 with DVI 0.71. The  aortic valve has been repaired/replaced. Aortic valve regurgitation is  trivial.   ________________________  Echo 07/23/20  IMPRESSIONS    1. Left ventricular ejection fraction, by estimation, is 60 to 65%. The  left ventricle has normal function. The left ventricle has no regional wall motion abnormalities. The left ventricular internal cavity size was mildly to moderately dilated. There  is mild concentric left ventricular hypertrophy. Left ventricular diastolic parameters are consistent with Grade II diastolic dysfunction (pseudonormalization).  2. Right ventricular systolic function is normal. The right ventricular size is normal. There is mildly elevated pulmonary artery systolic pressure.  3. Left atrial size was moderately dilated.  4. Right atrial size was moderately dilated.  5. The mitral valve is degenerative.  Trivial mitral valve regurgitation.  6. Perivalvular leak at the native [place] commissure.. The aortic valve has been repaired/replaced. Aortic valve regurgitation is mild to moderate. There is a 26 mm Sapien prosthetic (TAVR) valve present in the aortic position. Procedure Date:  06/30/2020. Aortic valve mean gradient measures 14.6 mmHg.  7. The inferior vena cava is dilated in size with >50% respiratory variability, suggesting right atrial pressure of 8 mmHg.  Comparison(s): Prior images reviewed side by side.  Conclusion(s)/Recommendation(s): S/P TAVR, appears well seated. Mean gradient 15 mmHg, prior was 19 mmHg on most recent echo. Perivalvular leak is more prominent on current study, mild to moderate based on images and PHT.  EKG:  EKG is NOT ordered today  Recent Labs: 03/03/2020: B Natriuretic Peptide 2,454.0 06/26/2020: ALT 13 07/01/2020: Magnesium 1.7 07/03/2020: BUN 31; Creatinine, Ser 1.55; Hemoglobin 10.3; Platelets 169; Potassium 4.4; Sodium 134  Recent Lipid Panel    Component Value Date/Time   CHOL 108 03/06/2020 0508   TRIG 68 03/06/2020 0508   HDL 30 (L) 03/06/2020 0508   CHOLHDL 3.6 03/06/2020 0508   VLDL 14 03/06/2020 0508   LDLCALC 64 03/06/2020 0508     Risk Assessment/Calculations:       Physical Exam:    VS:  BP (!) 162/56   Pulse 62   Ht 6\' 1"   (1.854 m)   Wt 185 lb (83.9 kg)   SpO2 95%   BMI 24.41 kg/m     Wt Readings from Last 3 Encounters:  07/23/20 185 lb (83.9 kg)  07/03/20 184 lb 4.8 oz (83.6 kg)  06/26/20 188 lb 6.4 oz (85.5 kg)     GEN: appears older than his stated age 53: Normal NECK: No JVD; No carotid bruits LYMPHATICS: No lymphadenopathy CARDIAC: RRR, no murmurs, rubs, gallops RESPIRATORY:  Clear to auscultation without rales, wheezing or rhonchi  ABDOMEN: Soft, non-tender, non-distended MUSCULOSKELETAL:  No edema; No deformity  SKIN: Warm and dry. Legs wrapped  NEUROLOGIC:  Alert and oriented x 3 PSYCHIATRIC:  Normal affect   ASSESSMENT:    1. S/P TAVR (transcatheter aortic valve replacement)   2. Stage 3 chronic kidney disease, unspecified whether stage 3a or 3b CKD ([place])   3. Essential hypertension   4. Chronic venous insufficiency   5. Abnormal CT of liver   6. Lymphadenopathy   7. Localized enlarged lymph nodes    PLAN:    In order of problems listed above:  Severe AS s/p TAVR:echo today shows EF 60% normally functioning TAVR with a mean gradient of 14.6 mm hg and mild-mod PVL (noted to be more prominent than previous studies). He had NYHA class II symptoms but has had a marked clinical benefit since TAVR. Continue on aspirin and plavix. He can discontinue plavix after 6 months of therapy (12/2020). SBE prophylaxis discussed; the patient is edentulous and does not go to the dentist. I will see him back in 1 year with an echo.   CKD stage stage III: check creat today   HTN: Bp elevated today. Will increase hydralazine from 50mg  to 75 mg TID. Continue all other htn meds.   Chronic venous stasis with LE edema: much better now. Wrapped in unna boots .  Porta hepatitis with lymphadenopathy: pre TAVR CT showed mild porta hepatis, mild mediastinal and mild bilateral hilar lymphadenopathy, nonspecific, potentially reactive. Radiologist suggested follow-up CT chest, abdomen and pelvis with IV  contrast in 3 months, which would be due now. Will order this. Needs a BMET  Shared Decision Making/Informed Consent{   Medication Adjustments/Labs and Tests Ordered: Current medicines are reviewed at length with the patient today.  Concerns regarding medicines are outlined above.  Orders Placed This Encounter  Procedures  . CT CHEST ABDOMEN PELVIS W CONTRAST  . Basic metabolic panel   Meds ordered this encounter  Medications  . hydrALAZINE (APRESOLINE) 50 MG tablet    Sig: Take 1.5 tablets (75 mg total) by mouth 3 (three) times daily.    Dispense:  405 tablet    Refill:  3    Patient Instructions  Medication Instructions:  1) INCREASE HYDRALAZINE to 75 mg three times daily 2) You may STOP PLAVIX when your prescription runs out in July  *If you need a refill on your cardiac medications before your next appointment, please call your pharmacy*  Lab Work: TODAY! BMET If you have labs (blood work) drawn today and your tests are completely normal, you will receive your results only by: Marland Kitchen MyChart Message (if you have MyChart) OR . A paper copy in the mail If you have any lab test that is abnormal or we need to change your treatment, we will call you to review the results.  Testing/Procedures: Joellen Jersey recommends you have a CT of the chest, abdomen and pelvis.  Follow-Up: You are scheduled with Dr. Marlou Porch on 10/21/2020 at [place], Angelena Form, PA-C  07/23/2020 9:31 PM    [place]

## 2020-07-23 NOTE — Patient Instructions (Addendum)
Medication Instructions:  1) INCREASE HYDRALAZINE to 75 mg three times daily 2) You may STOP PLAVIX when your prescription runs out in July  *If you need a refill on your cardiac medications before your next appointment, please call your pharmacy*  Lab Work: TODAY! BMET If you have labs (blood work) drawn today and your tests are completely normal, you will receive your results only by: Marland Kitchen MyChart Message (if you have MyChart) OR . A paper copy in the mail If you have any lab test that is abnormal or we need to change your treatment, we will call you to review the results.  Testing/Procedures: Joellen Jersey recommends you have a CT of the chest, abdomen and pelvis.  Follow-Up: You are scheduled with Dr. Marlou Porch on 10/21/2020 at [facility].

## 2020-07-24 ENCOUNTER — Encounter: Payer: Self-pay | Admitting: Physician Assistant

## 2020-07-24 ENCOUNTER — Ambulatory Visit: Payer: Medicare PPO | Admitting: Physician Assistant

## 2020-07-24 DIAGNOSIS — M1711 Unilateral primary osteoarthritis, right knee: Secondary | ICD-10-CM

## 2020-07-24 LAB — BASIC METABOLIC PANEL
BUN/Creatinine Ratio: 15 (ref 10–24)
BUN: 19 mg/dL (ref 8–27)
CO2: 20 mmol/L (ref 20–29)
Calcium: 9 mg/dL (ref 8.6–10.2)
Chloride: 104 mmol/L (ref 96–106)
Creatinine, Ser: 1.29 mg/dL — ABNORMAL HIGH (ref 0.76–1.27)
GFR calc Af Amer: 65 mL/min/{1.73_m2} (ref >59)
GFR calc non Af Amer: 56 mL/min/{1.73_m2} — ABNORMAL LOW (ref >59)
Glucose: 65 mg/dL (ref 65–99)
Potassium: 4.7 mmol/L (ref 3.5–5.2)
Sodium: 138 mmol/L (ref 134–144)

## 2020-07-24 MED ORDER — LIDOCAINE HCL 1 % IJ SOLN
5.0000 mL | INTRAMUSCULAR | Status: AC | PRN
  Administered 2020-07-24: 5 mL

## 2020-07-24 MED ORDER — METHYLPREDNISOLONE ACETATE 40 MG/ML IJ SUSP
40.0000 mg | INTRAMUSCULAR | Status: AC | PRN
  Administered 2020-07-24: 40 mg via INTRA_ARTICULAR

## 2020-07-24 NOTE — Progress Notes (Signed)
Office Visit Note   Patient: Jordan [name]           Date of Birth: 07/01/1950           MRN: [id number] Visit Date: 07/24/2020              Requested by: [name], [place],  [place] [address] PCP: [name], MD  Chief Complaint  Patient presents with  . Left Leg - Follow-up  . Right Leg - Follow-up      HPI: This is a pleasant 70 year old gentleman with a history of bilateral venous insufficiency. He had this under good control with compression socks. However after a recent hospitalization he had a return of swelling that made his socks too small. He also mentions today that he is going to go to [facility] for a injection into his right knee. He has a history of right knee arthritis and has been getting this injection of cortisone for a long time. He has not has had one recently  Assessment & Plan: Visit Diagnoses: No diagnosis found.  Plan: Continue with compression socks follow-up in a month or any sooner if needed. Patient did check with his physician at [facility] and they were fine with [place] injecting his knee today  Follow-Up Instructions: No follow-ups on file.   Ortho Exam  Patient is alert, oriented, no adenopathy, well-dressed, normal affect, normal respiratory effort. Examination lower extremities no ulcerations he does have some brawny skin changes but swelling is significantly decreased with wrinkling. He does have quite a bit of dry skin. No evidence of cellulitis or infection.  Right knee no effusion tender over the medial lateral joint line. Crepitus with range of motion. Minimal soft tissue swelling  Imaging: No results found. No images are attached to the encounter.  Labs: Lab Results  Component Value Date   HGBA1C 5.3 03/05/2020   HGBA1C 5.7 (H) 03/14/2017   HGBA1C 5.0 10/30/2014   ESRSEDRATE 28 (H) 10/30/2014   CRP 6.5 (H) 10/30/2014   REPTSTATUS 03/19/2017 FINAL 03/14/2017   CULT  03/14/2017    NO GROWTH 5  DAYS Performed at [facility], [place] [address] [place],  [address]      Lab Results  Component Value Date   ALBUMIN 3.3 (L) 06/26/2020   ALBUMIN 3.1 (L) 06/05/2020   ALBUMIN 3.1 (L) 03/03/2020   PREALBUMIN 9.4 (L) 10/30/2014    Lab Results  Component Value Date   MG 1.7 07/01/2020   MG 1.8 03/12/2020   MG 1.8 03/05/2020   No results found for: [facility]  Lab Results  Component Value Date   PREALBUMIN 9.4 (L) 10/30/2014   CBC EXTENDED Latest Ref Rng & Units 07/03/2020 07/02/2020 07/01/2020  WBC 4.0 - 10.5 K/uL 7.7 8.1 11.9(H)  RBC 4.22 - 5.81 MIL/uL 3.69(L) 3.30(L) 3.64(L)  HGB 13.0 - 17.0 g/dL 10.3(L) 9.2(L) 10.3(L)  HCT 39.0 - 52.0 % 32.4(L) 29.1(L) 32.0(L)  PLT 150 - 400 K/uL 169 142(L) 177  NEUTROABS 1.7 - 7.7 K/uL - - -  LYMPHSABS 0.7 - 4.0 K/uL - - -     There is no height or weight on file to calculate BMI.  Orders:  No orders of the defined types were placed in this encounter.  No orders of the defined types were placed in this encounter.    Procedures: Large Joint Inj on 07/24/2020 3:27 PM Indications: pain and diagnostic evaluation Details: 22 G 1.5 in needle, anteromedial approach  Arthrogram: No  Medications: 40 mg methylPREDNISolone acetate 40 MG/ML; 5 mL lidocaine 1 % Outcome: tolerated well, no immediate complications Procedure, treatment alternatives, risks and benefits explained, specific risks discussed. Consent was given by the patient.      Clinical Data: No additional findings.  ROS:  All other systems negative, except as noted in the HPI. Review of Systems  Objective: Vital Signs: There were no vitals taken for this visit.  Specialty Comments:  No specialty comments available.  PMFS History: Patient Active Problem List   Diagnosis Date Noted  . 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 Diabetes mellitus without complication ([place])   . COPD (chronic obstructive pulmonary disease) ([place])   .  CAD (coronary artery disease)   . Severe aortic stenosis   . GERD (gastroesophageal reflux disease)   . Protein-calorie malnutrition, severe 03/05/2020  . Tobacco abuse 03/03/2020  . Pressure injury of skin 03/15/2017  . Chronic venous insufficiency 04/28/2014  . PAD (peripheral artery disease) ([place]) 01/21/2014  . Varicose veins of lower extremities with other complications 28/78/6767  . Hx of TIA (transient ischemic attack) and stroke 12/31/2013  . HLD (hyperlipidemia) 12/31/2013  . Severe obesity (BMI >= 40) ([place]) 12/31/2013  . Essential hypertension, benign 09/13/2012  . Edema 09/13/2012   Past Medical History:  Diagnosis Date  . Anxiety   . Arthritis    knee and neck  . CAD (coronary artery disease)    cath 9/21: LAD calcified, pLCx 35, mRCA 30, dRCA 30  . Chronic diastolic CHF   . COPD (chronic obstructive pulmonary disease) ([place])   . Depression   . Diabetes mellitus without complication ([place])   . GERD (gastroesophageal reflux disease)   . Hyperlipidemia   . Hypertension   . Migraine   . Obesity   . 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 Burt Knack and Dr. Cyndia Bent  . Severe aortic stenosis    Severe aortic insufficiency, moderate to severe aortic stenosis  . Sleep apnea    CPAP   . Venous insufficiency    managed by ortho (Dr. Sharol Given)    Family History  Problem Relation Age of Onset  . Hypertension Mother   . Alzheimer's disease Mother   . Stroke Father        x 3  . Heart attack Father   . Congestive Heart Failure Father   . Diabetes Other        mat great aunts  . Colon cancer Neg Hx   . Rectal cancer Neg Hx   . Esophageal cancer Neg Hx     Past Surgical History:  Procedure Laterality Date  . ANKLE SURGERY Right    Dr. Marcelino Scot, has pins  . COLONOSCOPY W/ POLYPECTOMY    . KNEE ARTHROSCOPY Right    Dr. Noemi Chapel  . MULTIPLE EXTRACTIONS WITH ALVEOLOPLASTY Bilateral 05/22/2020    Procedure: MULTIPLE EXTRACTION WITH ALVEOLOPLASTY;  Surgeon: Diona Browner, DMD;  Location: [place] [facility];  Service: Oral Surgery;  Laterality: Bilateral;  . right foot surgery,rt great toe callus removal    . RIGHT/LEFT HEART CATH AND CORONARY ANGIOGRAPHY N/A 03/06/2020   Procedure: RIGHT/LEFT HEART CATH AND CORONARY ANGIOGRAPHY;  Surgeon: Sherren Mocha, MD;  Location: [facility];  Service: Cardiovascular;  Laterality: N/A;  . TEE WITHOUT CARDIOVERSION N/A 03/09/2020   Procedure: TRANSESOPHAGEAL ECHOCARDIOGRAM (TEE);  Surgeon: Dorothy Spark, MD;  Location: [facility] ENDOSCOPY;  Service: Cardiovascular;  Laterality: N/A;  . TEE WITHOUT CARDIOVERSION N/A 06/30/2020   Procedure: TRANSESOPHAGEAL ECHOCARDIOGRAM (TEE);  Surgeon: Sherren Mocha, MD;  Location: [facility];  Service: Open Heart Surgery;  Laterality: N/A;  . TONSILLECTOMY AND ADENOIDECTOMY     age 9  . TRANSCATHETER AORTIC VALVE REPLACEMENT, TRANSFEMORAL N/A 06/30/2020   Procedure: TRANSCATHETER AORTIC VALVE REPLACEMENT, TRANSFEMORAL;  Surgeon: Sherren Mocha, MD;  Location: [facility];  Service: Open Heart Surgery;  Laterality: N/A;   Social History   Occupational History  . Occupation: retired Youth worker: RETIRED  Tobacco Use  . Smoking status: Current Some Day Smoker    Packs/day: 0.75    Years: 55.00    Pack years: 41.25    Types: Cigarettes  . Smokeless tobacco: Never Used  Vaping Use  . Vaping Use: Never used  Substance and Sexual Activity  . Alcohol use: No    Alcohol/week: 0.0 standard drinks  . Drug use: No  . Sexual activity: Yes    Birth control/protection: Condom

## 2020-07-31 ENCOUNTER — Other Ambulatory Visit: Payer: Medicare PPO

## 2020-07-31 ENCOUNTER — Other Ambulatory Visit: Payer: Self-pay

## 2020-07-31 ENCOUNTER — Ambulatory Visit
Admission: RE | Admit: 2020-07-31 | Discharge: 2020-07-31 | Disposition: A | Discharge status: Home / Self Care | Payer: Medicare PPO | Source: Ambulatory Visit | Attending: Physician Assistant | Admitting: Physician Assistant

## 2020-07-31 DIAGNOSIS — R932 Abnormal findings on diagnostic imaging of liver and biliary tract: Secondary | ICD-10-CM

## 2020-07-31 DIAGNOSIS — R591 Generalized enlarged lymph nodes: Secondary | ICD-10-CM

## 2020-07-31 MED ORDER — IOHEXOL 300 MG/ML  SOLN: 100.0000 mL | Freq: Once | INTRAMUSCULAR | Status: DC | PRN

## 2020-08-06 ENCOUNTER — Ambulatory Visit (INDEPENDENT_AMBULATORY_CARE_PROVIDER_SITE_OTHER)
Admission: RE | Admit: 2020-08-06 | Discharge: 2020-08-06 | Disposition: A | Discharge status: Home / Self Care | Payer: Medicare PPO | Source: Ambulatory Visit | Attending: Physician Assistant | Admitting: Physician Assistant

## 2020-08-06 ENCOUNTER — Other Ambulatory Visit: Payer: Self-pay

## 2020-08-06 DIAGNOSIS — R932 Abnormal findings on diagnostic imaging of liver and biliary tract: Secondary | ICD-10-CM | POA: Diagnosis not present

## 2020-08-06 DIAGNOSIS — R591 Generalized enlarged lymph nodes: Secondary | ICD-10-CM | POA: Diagnosis not present

## 2020-08-06 MED ORDER — IOHEXOL 300 MG/ML  SOLN
100.0000 mL | Freq: Once | INTRAMUSCULAR | Status: AC | PRN
  Administered 2020-08-06: 100 mL via INTRAVENOUS

## 2020-08-11 ENCOUNTER — Other Ambulatory Visit: Payer: Self-pay | Admitting: Physician Assistant

## 2020-08-11 DIAGNOSIS — R590 Localized enlarged lymph nodes: Secondary | ICD-10-CM

## 2020-08-14 ENCOUNTER — Encounter (HOSPITAL_COMMUNITY)
Admission: RE | Admit: 2020-08-14 | Discharge: 2020-08-14 | Disposition: A | Discharge status: Home / Self Care | Payer: Medicare PPO | Source: Ambulatory Visit | Attending: Physician Assistant | Admitting: Physician Assistant

## 2020-08-14 ENCOUNTER — Other Ambulatory Visit: Payer: Self-pay

## 2020-08-14 DIAGNOSIS — I700 Atherosclerosis of aorta: Secondary | ICD-10-CM | POA: Diagnosis not present

## 2020-08-14 DIAGNOSIS — R590 Localized enlarged lymph nodes: Secondary | ICD-10-CM | POA: Diagnosis not present

## 2020-08-14 DIAGNOSIS — N3289 Other specified disorders of bladder: Secondary | ICD-10-CM | POA: Insufficient documentation

## 2020-08-14 DIAGNOSIS — J439 Emphysema, unspecified: Secondary | ICD-10-CM | POA: Insufficient documentation

## 2020-08-14 LAB — GLUCOSE, CAPILLARY: Glucose-Capillary: 91 mg/dL (ref 70–99)

## 2020-08-14 MED ORDER — FLUDEOXYGLUCOSE F - 18 (FDG) INJECTION
9.6000 | Freq: Once | INTRAVENOUS | Status: AC | PRN
  Administered 2020-08-14: 8.9 via INTRAVENOUS

## 2020-08-20 ENCOUNTER — Ambulatory Visit: Payer: Medicare PPO | Admitting: Orthopedic Surgery

## 2020-08-21 ENCOUNTER — Ambulatory Visit: Payer: Medicare PPO | Admitting: Physician Assistant

## 2020-08-31 ENCOUNTER — Ambulatory Visit (INDEPENDENT_AMBULATORY_CARE_PROVIDER_SITE_OTHER): Payer: Medicare PPO | Admitting: Orthopedic Surgery

## 2020-08-31 ENCOUNTER — Encounter: Payer: Self-pay | Admitting: Orthopedic Surgery

## 2020-08-31 ENCOUNTER — Other Ambulatory Visit: Payer: Self-pay

## 2020-08-31 DIAGNOSIS — I872 Venous insufficiency (chronic) (peripheral): Secondary | ICD-10-CM | POA: Diagnosis not present

## 2020-08-31 NOTE — Progress Notes (Signed)
Office Visit Note   Patient: Jordan [name]           Date of Birth: Jan 18, 1951           MRN: [id number] Visit Date: 08/31/2020              Requested by: [name], [place],  [place] [address] PCP: [name], MD  Chief Complaint  Patient presents with  . Right Leg - Follow-up  . Left Leg - Follow-up      HPI: Patient is a 70 year old gentleman with massive swelling with venous insufficiency both lower extremities who has been wearing a size double extra-large compression stocking.  Assessment & Plan: Visit Diagnoses:  1. Venous stasis dermatitis of both lower extremities     Plan: Patient's legs continue to improve he was given a sample of an extra-large compression stocking to be worn bilaterally  Follow-Up Instructions: Return in about 4 weeks (around 09/28/2020).   Ortho Exam  Patient is alert, oriented, no adenopathy, well-dressed, normal affect, normal respiratory effort. Examination patient has venous and lymphatic insufficiency both lower extremities with dry cracked skin globally around the foot and leg.  The callus and dry skin was removed gently.  There is no open wounds no cellulitis no signs of infection.  A sample extra-large compression sock was applied.  Patient's calf measures 45 cm in circumference bilaterally.  Imaging: No results found. No images are attached to the encounter.  Labs: Lab Results  Component Value Date   HGBA1C 5.3 03/05/2020   HGBA1C 5.7 (H) 03/14/2017   HGBA1C 5.0 10/30/2014   ESRSEDRATE 28 (H) 10/30/2014   CRP 6.5 (H) 10/30/2014   REPTSTATUS 03/19/2017 FINAL 03/14/2017   CULT  03/14/2017    NO GROWTH 5 DAYS Performed at [facility], [place] [address], [place],  [address]      Lab Results  Component Value Date   ALBUMIN 3.3 (L) 06/26/2020   ALBUMIN 3.1 (L) 06/05/2020   ALBUMIN 3.1 (L) 03/03/2020   PREALBUMIN 9.4 (L) 10/30/2014    Lab Results  Component Value Date   MG 1.7  07/01/2020   MG 1.8 03/12/2020   MG 1.8 03/05/2020   No results found for: [facility]  Lab Results  Component Value Date   PREALBUMIN 9.4 (L) 10/30/2014   CBC EXTENDED Latest Ref Rng & Units 07/03/2020 07/02/2020 07/01/2020  WBC 4.0 - 10.5 K/uL 7.7 8.1 11.9(H)  RBC 4.22 - 5.81 MIL/uL 3.69(L) 3.30(L) 3.64(L)  HGB 13.0 - 17.0 g/dL 10.3(L) 9.2(L) 10.3(L)  HCT 39.0 - 52.0 % 32.4(L) 29.1(L) 32.0(L)  PLT 150 - 400 K/uL 169 142(L) 177  NEUTROABS 1.7 - 7.7 K/uL - - -  LYMPHSABS 0.7 - 4.0 K/uL - - -     There is no height or weight on file to calculate BMI.  Orders:  No orders of the defined types were placed in this encounter.  No orders of the defined types were placed in this encounter.    Procedures: No procedures performed  Clinical Data: No additional findings.  ROS:  All other systems negative, except as noted in the HPI. Review of Systems  Objective: Vital Signs: There were no vitals taken for this visit.  Specialty Comments:  No specialty comments available.  PMFS History: Patient Active Problem List   Diagnosis Date Noted  . 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 Diabetes mellitus without complication ([place])   . COPD (chronic obstructive pulmonary  disease) ([place])   . CAD (coronary artery disease)   . Severe aortic stenosis   . GERD (gastroesophageal reflux disease)   . Protein-calorie malnutrition, severe 03/05/2020  . Tobacco abuse 03/03/2020  . Pressure injury of skin 03/15/2017  . Chronic venous insufficiency 04/28/2014  . PAD (peripheral artery disease) ([place]) 01/21/2014  . Varicose veins of lower extremities with other complications 31/49/7026  . Hx of TIA (transient ischemic attack) and stroke 12/31/2013  . HLD (hyperlipidemia) 12/31/2013  . Severe obesity (BMI >= 40) ([place]) 12/31/2013  . Essential hypertension, benign 09/13/2012  . Edema 09/13/2012   Past Medical History:  Diagnosis Date  . Anxiety   . Arthritis    knee and  neck  . CAD (coronary artery disease)    cath 9/21: LAD calcified, pLCx 71, mRCA 30, dRCA 30  . Chronic diastolic CHF   . COPD (chronic obstructive pulmonary disease) ([employer])   . Depression   . Diabetes mellitus without complication ([place])   . GERD (gastroesophageal reflux disease)   . Hyperlipidemia   . Hypertension   . Migraine   . Obesity   . 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 Burt Knack and Dr. Cyndia Bent  . Severe aortic stenosis    Severe aortic insufficiency, moderate to severe aortic stenosis  . Sleep apnea    CPAP   . Venous insufficiency    managed by ortho (Dr. Sharol Given)    Family History  Problem Relation Age of Onset  . Hypertension Mother   . Alzheimer's disease Mother   . Stroke Father        x 3  . Heart attack Father   . Congestive Heart Failure Father   . Diabetes Other        mat great aunts  . Colon cancer Neg Hx   . Rectal cancer Neg Hx   . Esophageal cancer Neg Hx     Past Surgical History:  Procedure Laterality Date  . ANKLE SURGERY Right    Dr. Marcelino Scot, has pins  . COLONOSCOPY W/ POLYPECTOMY    . KNEE ARTHROSCOPY Right    Dr. Noemi Chapel  . MULTIPLE EXTRACTIONS WITH ALVEOLOPLASTY Bilateral 05/22/2020   Procedure: MULTIPLE EXTRACTION WITH ALVEOLOPLASTY;  Surgeon: Diona Browner, DMD;  Location: [place];  Service: Oral Surgery;  Laterality: Bilateral;  . right foot surgery,rt great toe callus removal    . RIGHT/LEFT HEART CATH AND CORONARY ANGIOGRAPHY N/A 03/06/2020   Procedure: RIGHT/LEFT HEART CATH AND CORONARY ANGIOGRAPHY;  Surgeon: Sherren Mocha, MD;  Location: [place] CV LAB;  Service: Cardiovascular;  Laterality: N/A;  . TEE WITHOUT CARDIOVERSION N/A 03/09/2020   Procedure: TRANSESOPHAGEAL ECHOCARDIOGRAM (TEE);  Surgeon: Dorothy Spark, MD;  Location: [facility] ENDOSCOPY;  Service: Cardiovascular;  Laterality: N/A;  . TEE WITHOUT CARDIOVERSION N/A 06/30/2020   Procedure:  TRANSESOPHAGEAL ECHOCARDIOGRAM (TEE);  Surgeon: Sherren Mocha, MD;  Location: [facility] [place];  Service: Open Heart Surgery;  Laterality: N/A;  . TONSILLECTOMY AND ADENOIDECTOMY     age 42  . TRANSCATHETER AORTIC VALVE REPLACEMENT, TRANSFEMORAL N/A 06/30/2020   Procedure: TRANSCATHETER AORTIC VALVE REPLACEMENT, TRANSFEMORAL;  Surgeon: Sherren Mocha, MD;  Location: [facility];  Service: Open Heart Surgery;  Laterality: N/A;   Social History   Occupational History  . Occupation: retired Youth worker: RETIRED  Tobacco Use  . Smoking status: Current Some Day Smoker    Packs/day: 0.75  Years: 55.00    Pack years: 41.25    Types: Cigarettes  . Smokeless tobacco: Never Used  Vaping Use  . Vaping Use: Never used  Substance and Sexual Activity  . Alcohol use: No    Alcohol/week: 0.0 standard drinks  . Drug use: No  . Sexual activity: Yes    Birth control/protection: Condom

## 2020-09-22 ENCOUNTER — Other Ambulatory Visit: Payer: Self-pay | Admitting: Physician Assistant

## 2020-09-22 ENCOUNTER — Telehealth: Payer: Self-pay | Admitting: Orthopedic Surgery

## 2020-09-22 NOTE — Telephone Encounter (Signed)
Patient called. He would like to know if he could get a RX for celebrex? His call back number is [phone].

## 2020-09-22 NOTE — Telephone Encounter (Signed)
Called pt to advise of message below. He will check with cards and advise.

## 2020-09-22 NOTE — Telephone Encounter (Signed)
Happy to prescribe but needs to check that this medication is ok with his cardiologist or PCP

## 2020-09-23 ENCOUNTER — Other Ambulatory Visit: Payer: Self-pay | Admitting: Physician Assistant

## 2020-09-23 MED ORDER — CELECOXIB 200 MG PO CAPS: 200.0000 mg | ORAL_CAPSULE | Freq: Two times a day (BID) | ORAL | 1 refills | Status: DC

## 2020-09-23 NOTE — Telephone Encounter (Signed)
Patient he stated his primary care  Dr.South stated it was okay for patient to take Celebrex, he is requesting a clarence form to be faxed over to their office. [facility] is [facility] [address] [place] call back:[phone]

## 2020-09-23 NOTE — Telephone Encounter (Signed)
done

## 2020-10-06 ENCOUNTER — Ambulatory Visit: Payer: Medicare PPO | Admitting: Orthopedic Surgery

## 2020-10-15 ENCOUNTER — Ambulatory Visit: Payer: Medicare PPO | Admitting: Orthopedic Surgery

## 2020-10-21 ENCOUNTER — Ambulatory Visit: Payer: Medicare PPO | Admitting: Cardiology

## 2020-10-22 ENCOUNTER — Encounter: Payer: Self-pay | Admitting: Orthopedic Surgery

## 2020-10-22 ENCOUNTER — Ambulatory Visit: Payer: Medicare PPO | Admitting: Orthopedic Surgery

## 2020-10-22 DIAGNOSIS — M1711 Unilateral primary osteoarthritis, right knee: Secondary | ICD-10-CM

## 2020-10-22 DIAGNOSIS — I872 Venous insufficiency (chronic) (peripheral): Secondary | ICD-10-CM

## 2020-10-22 MED ORDER — METHYLPREDNISOLONE ACETATE 40 MG/ML IJ SUSP
40.0000 mg | INTRAMUSCULAR | Status: AC | PRN
  Administered 2020-10-22: 40 mg via INTRA_ARTICULAR

## 2020-10-22 MED ORDER — LIDOCAINE HCL 1 % IJ SOLN
5.0000 mL | INTRAMUSCULAR | Status: AC | PRN
  Administered 2020-10-22: 5 mL

## 2020-10-22 NOTE — Progress Notes (Signed)
Office Visit Note   Patient: Jordan [name]           Date of Birth: [birth year]           MRN: [id number] Visit Date: 10/22/2020              Requested by: [name], [place],  [place] [address] PCP: [name], MD  Chief Complaint  Patient presents with  . Right Leg - Follow-up  . Left Leg - Follow-up      HPI: Patient is a pleasant gentleman who we see for bilateral venous stasis disease.  He does wear his compression socks faithfully.  He said he was given a smaller sock at his last visit but it was very itchy and uncomfortable.  He is also wondering if he can get another cortisone injection into his right knee  Assessment & Plan: Visit Diagnoses: No diagnosis found.  Plan: We will go forward with another injection into his right knee.  He will follow-up in 1 month.  He I told her if he has any increased swelling or concerns to follow-up sooner  Follow-Up Instructions: No follow-ups on file.   Ortho Exam  Patient is alert, oriented, no adenopathy, well-dressed, normal affect, normal respiratory effort. Bilateral lower extremities he has chronic venous stasis changes no open ulcers he does have more swelling on the left than the right no ascending cellulitis right knee valgus arthritis no effusion tenderness over the lateral joint line  Imaging: No results found. No images are attached to the encounter.  Labs: Lab Results  Component Value Date   HGBA1C 5.3 03/05/2020   HGBA1C 5.7 (H) 03/14/2017   HGBA1C 5.0 10/30/2014   ESRSEDRATE 28 (H) 10/30/2014   CRP 6.5 (H) 10/30/2014   REPTSTATUS 03/19/2017 FINAL 03/14/2017   CULT  03/14/2017    NO GROWTH 5 DAYS Performed at [facility], [place] [address] [place], [place] [address]      Lab Results  Component Value Date   ALBUMIN 3.3 (L) 06/26/2020   ALBUMIN 3.1 (L) 06/05/2020   ALBUMIN 3.1 (L) 03/03/2020   PREALBUMIN 9.4 (L) 10/30/2014    Lab Results  Component Value Date   MG  1.7 07/01/2020   MG 1.8 03/12/2020   MG 1.8 03/05/2020   No results found for: [facility]  Lab Results  Component Value Date   PREALBUMIN 9.4 (L) 10/30/2014   CBC EXTENDED Latest Ref Rng & Units 07/03/2020 07/02/2020 07/01/2020  WBC 4.0 - 10.5 K/uL 7.7 8.1 11.9(H)  RBC 4.22 - 5.81 MIL/uL 3.69(L) 3.30(L) 3.64(L)  HGB 13.0 - 17.0 g/dL 10.3(L) 9.2(L) 10.3(L)  HCT 39.0 - 52.0 % 32.4(L) 29.1(L) 32.0(L)  PLT 150 - 400 K/uL 169 142(L) 177  NEUTROABS 1.7 - 7.7 K/uL - - -  LYMPHSABS 0.7 - 4.0 K/uL - - -     There is no height or weight on file to calculate BMI.  Orders:  No orders of the defined types were placed in this encounter.  No orders of the defined types were placed in this encounter.    Procedures: Large Joint Inj: R knee on 10/22/2020 3:55 PM Indications: pain and diagnostic evaluation Details: 22 G 1.5 in needle, anteromedial approach  Arthrogram: No  Medications: 40 mg methylPREDNISolone acetate 40 MG/ML; 5 mL lidocaine 1 % Outcome: tolerated well, no immediate complications Procedure, treatment alternatives, risks and benefits explained, specific risks discussed. Consent was given by the patient. Immediately prior to procedure  a time out was called to verify the correct patient, procedure, equipment, support staff and site/side marked as required. Patient was prepped and draped in the usual sterile fashion.      Clinical Data: No additional findings.  ROS:  All other systems negative, except as noted in the HPI. Review of Systems  Objective: Vital Signs: There were no vitals taken for this visit.  Specialty Comments:  No specialty comments available.  PMFS History: Patient Active Problem List   Diagnosis Date Noted  . 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 Diabetes mellitus without complication ([place])   . COPD (chronic obstructive pulmonary disease) ([place])   . CAD (coronary artery disease)   . Severe aortic stenosis   . GERD  (gastroesophageal reflux disease)   . Protein-calorie malnutrition, severe 03/05/2020  . Tobacco abuse 03/03/2020  . Pressure injury of skin 03/15/2017  . Chronic venous insufficiency 04/28/2014  . PAD (peripheral artery disease) ([place]) 01/21/2014  . Varicose veins of lower extremities with other complications 40/98/1191  . Hx of TIA (transient ischemic attack) and stroke 12/31/2013  . HLD (hyperlipidemia) 12/31/2013  . Severe obesity (BMI >= 40) ([place]) 12/31/2013  . Essential hypertension, benign 09/13/2012  . Edema 09/13/2012   Past Medical History:  Diagnosis Date  . Anxiety   . Arthritis    knee and neck  . CAD (coronary artery disease)    cath 9/21: LAD calcified, pLCx 26, mRCA 30, dRCA 30  . Chronic diastolic CHF   . COPD (chronic obstructive pulmonary disease) ([place])   . Depression   . Diabetes mellitus without complication ([place])   . GERD (gastroesophageal reflux disease)   . Hyperlipidemia   . Hypertension   . Migraine   . Obesity   . 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 Burt Knack and Dr. Cyndia Bent  . Severe aortic stenosis    Severe aortic insufficiency, moderate to severe aortic stenosis  . Sleep apnea    CPAP   . Venous insufficiency    managed by ortho (Dr. Sharol Given)    Family History  Problem Relation Age of Onset  . Hypertension Mother   . Alzheimer's disease Mother   . Stroke Father        x 3  . Heart attack Father   . Congestive Heart Failure Father   . Diabetes Other        mat great aunts  . Colon cancer Neg Hx   . Rectal cancer Neg Hx   . Esophageal cancer Neg Hx     Past Surgical History:  Procedure Laterality Date  . ANKLE SURGERY Right    Dr. Marcelino Scot, has pins  . COLONOSCOPY W/ POLYPECTOMY    . KNEE ARTHROSCOPY Right    Dr. Noemi Chapel  . MULTIPLE EXTRACTIONS WITH ALVEOLOPLASTY Bilateral 05/22/2020   Procedure: MULTIPLE EXTRACTION WITH ALVEOLOPLASTY;  Surgeon: Diona Browner, DMD;  Location: [facility];  Service: Oral Surgery;  Laterality: Bilateral;  . right foot surgery,rt great toe callus removal    . RIGHT/LEFT HEART CATH AND CORONARY ANGIOGRAPHY N/A 03/06/2020   Procedure: RIGHT/LEFT HEART CATH AND CORONARY ANGIOGRAPHY;  Surgeon: Sherren Mocha, MD;  Location: [facility];  Service: Cardiovascular;  Laterality: N/A;  . TEE WITHOUT CARDIOVERSION N/A 03/09/2020   Procedure: TRANSESOPHAGEAL ECHOCARDIOGRAM (TEE);  Surgeon: Dorothy Spark, MD;  Location: [facility];  Service: Cardiovascular;  Laterality: N/A;  .  TEE WITHOUT CARDIOVERSION N/A 06/30/2020   Procedure: TRANSESOPHAGEAL ECHOCARDIOGRAM (TEE);  Surgeon: Sherren Mocha, MD;  Location: [facility];  Service: Open Heart Surgery;  Laterality: N/A;  . TONSILLECTOMY AND ADENOIDECTOMY     age 19  . TRANSCATHETER AORTIC VALVE REPLACEMENT, TRANSFEMORAL N/A 06/30/2020   Procedure: TRANSCATHETER AORTIC VALVE REPLACEMENT, TRANSFEMORAL;  Surgeon: Sherren Mocha, MD;  Location: [facility];  Service: Open Heart Surgery;  Laterality: N/A;   Social History   Occupational History  . Occupation: retired Youth worker: RETIRED  Tobacco Use  . Smoking status: Current Some Day Smoker    Packs/day: 0.75    Years: 55.00    Pack years: 41.25    Types: Cigarettes  . Smokeless tobacco: Never Used  Vaping Use  . Vaping Use: Never used  Substance and Sexual Activity  . Alcohol use: No    Alcohol/week: 0.0 standard drinks  . Drug use: No  . Sexual activity: Yes    Birth control/protection: Condom

## 2020-11-18 ENCOUNTER — Encounter: Payer: Self-pay | Admitting: Physician Assistant

## 2020-11-18 ENCOUNTER — Other Ambulatory Visit: Payer: Self-pay

## 2020-11-18 ENCOUNTER — Ambulatory Visit: Payer: Medicare PPO | Admitting: Physician Assistant

## 2020-11-18 ENCOUNTER — Ambulatory Visit (INDEPENDENT_AMBULATORY_CARE_PROVIDER_SITE_OTHER): Payer: Medicare PPO

## 2020-11-18 DIAGNOSIS — M79672 Pain in left foot: Secondary | ICD-10-CM

## 2020-11-18 NOTE — Progress Notes (Signed)
Office Visit Note   Patient: Jordan [name]           Date of Birth: September 07, 1950           MRN: [id number] Visit Date: 11/18/2020              Requested by: [name], [place],  [place] [address] PCP: [name], MD  Chief Complaint  Patient presents with  . Right Leg - Follow-up  . Left Leg - Follow-up      HPI Patient presents today for follow-up on his bilateral venous stasis dermatitis.  He has been wearing medical compression socks which are placed on him by his nursing assistant He complains of left heel pain for the last couple weeks.  Denies any injury. Assessment & Plan: Visit Diagnoses:  1. Pain of left heel     Plan: Patient will be provided a PRAFO boot.  Also he will have his nursing assistant check his ulcers daily.  I have given him doughnuts to place in his shoes.  Follow-up in 3 weeks.  Follow-Up Instructions: No follow-ups on file.   Ortho Exam  Patient is alert, oriented, no adenopathy, well-dressed, normal affect, normal respiratory effort. Examination of his bilateral lower extremities chronic venous stasis changes.  No ascending cellulitis.  Beneath both great toes at the distal phalanx he has small ulcers.  These do not probe deeply to bone and there is no surrounding cellulitis.  His heel does not demonstrate any skin breakdown or any ulcers.  No cellulitis.  Imaging: No results found. No images are attached to the encounter.  Labs: Lab Results  Component Value Date   HGBA1C 5.3 03/05/2020   HGBA1C 5.7 (H) 03/14/2017   HGBA1C 5.0 10/30/2014   ESRSEDRATE 28 (H) 10/30/2014   CRP 6.5 (H) 10/30/2014   REPTSTATUS 03/19/2017 FINAL 03/14/2017   CULT  03/14/2017    NO GROWTH 5 DAYS Performed at [facility], [place] [address], [place], [place] [address]      Lab Results  Component Value Date   ALBUMIN 3.3 (L) 06/26/2020   ALBUMIN 3.1 (L) 06/05/2020   ALBUMIN 3.1 (L) 03/03/2020   PREALBUMIN 9.4 (L) 10/30/2014     Lab Results  Component Value Date   MG 1.7 07/01/2020   MG 1.8 03/12/2020   MG 1.8 03/05/2020   No results found for: [facility]  Lab Results  Component Value Date   PREALBUMIN 9.4 (L) 10/30/2014   CBC EXTENDED Latest Ref Rng & Units 07/03/2020 07/02/2020 07/01/2020  WBC 4.0 - 10.5 K/uL 7.7 8.1 11.9(H)  RBC 4.22 - 5.81 MIL/uL 3.69(L) 3.30(L) 3.64(L)  HGB 13.0 - 17.0 g/dL 10.3(L) 9.2(L) 10.3(L)  HCT 39.0 - 52.0 % 32.4(L) 29.1(L) 32.0(L)  PLT 150 - 400 K/uL 169 142(L) 177  NEUTROABS 1.7 - 7.7 K/uL - - -  LYMPHSABS 0.7 - 4.0 K/uL - - -     There is no height or weight on file to calculate BMI.  Orders:  Orders Placed This Encounter  Procedures  . XR Os Calcis Left   No orders of the defined types were placed in this encounter.    Procedures: No procedures performed  Clinical Data: No additional findings.  ROS:  All other systems negative, except as noted in the HPI. Review of Systems  Objective: Vital Signs: There were no vitals taken for this visit.  Specialty Comments:  No specialty comments available.  PMFS History: Patient Active Problem List  Diagnosis Date Noted  . 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 Diabetes mellitus without complication ([place])   . COPD (chronic obstructive pulmonary disease) ([place])   . CAD (coronary artery disease)   . Severe aortic stenosis   . GERD (gastroesophageal reflux disease)   . Protein-calorie malnutrition, severe 03/05/2020  . Tobacco abuse 03/03/2020  . Pressure injury of skin 03/15/2017  . Chronic venous insufficiency 04/28/2014  . PAD (peripheral artery disease) ([place]) 01/21/2014  . Varicose veins of lower extremities with other complications 54/62/7035  . Hx of TIA (transient ischemic attack) and stroke 12/31/2013  . HLD (hyperlipidemia) 12/31/2013  . Severe obesity (BMI >= 40) ([place]) 12/31/2013  . Essential hypertension, benign 09/13/2012  . Edema 09/13/2012   Past Medical History:   Diagnosis Date  . Anxiety   . Arthritis    knee and neck  . CAD (coronary artery disease)    cath 9/21: LAD calcified, pLCx 63, mRCA 30, dRCA 30  . Chronic diastolic CHF   . COPD (chronic obstructive pulmonary disease) ([place])   . Depression   . Diabetes mellitus without complication ([place])   . GERD (gastroesophageal reflux disease)   . Hyperlipidemia   . Hypertension   . Migraine   . Obesity   . 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 Burt Knack and Dr. Cyndia Bent  . Severe aortic stenosis    Severe aortic insufficiency, moderate to severe aortic stenosis  . Sleep apnea    CPAP   . Venous insufficiency    managed by ortho (Dr. Sharol Given)    Family History  Problem Relation Age of Onset  . Hypertension Mother   . Alzheimer's disease Mother   . Stroke Father        x 3  . Heart attack Father   . Congestive Heart Failure Father   . Diabetes Other        mat great aunts  . Colon cancer Neg Hx   . Rectal cancer Neg Hx   . Esophageal cancer Neg Hx     Past Surgical History:  Procedure Laterality Date  . ANKLE SURGERY Right    Dr. Marcelino Scot, has pins  . COLONOSCOPY W/ POLYPECTOMY    . KNEE ARTHROSCOPY Right    Dr. Noemi Chapel  . MULTIPLE EXTRACTIONS WITH ALVEOLOPLASTY Bilateral 05/22/2020   Procedure: MULTIPLE EXTRACTION WITH ALVEOLOPLASTY;  Surgeon: Diona Browner, DMD;  Location: [facility];  Service: Oral Surgery;  Laterality: Bilateral;  . right foot surgery,rt great toe callus removal    . RIGHT/LEFT HEART CATH AND CORONARY ANGIOGRAPHY N/A 03/06/2020   Procedure: RIGHT/LEFT HEART CATH AND CORONARY ANGIOGRAPHY;  Surgeon: Sherren Mocha, MD;  Location: [facility];  Service: Cardiovascular;  Laterality: N/A;  . TEE WITHOUT CARDIOVERSION N/A 03/09/2020   Procedure: TRANSESOPHAGEAL ECHOCARDIOGRAM (TEE);  Surgeon: Dorothy Spark, MD;  Location: [facility] ENDOSCOPY;  Service: Cardiovascular;  Laterality: N/A;  . TEE  WITHOUT CARDIOVERSION N/A 06/30/2020   Procedure: TRANSESOPHAGEAL ECHOCARDIOGRAM (TEE);  Surgeon: Sherren Mocha, MD;  Location: [facility];  Service: Open Heart Surgery;  Laterality: N/A;  . TONSILLECTOMY AND ADENOIDECTOMY     age 48  . TRANSCATHETER AORTIC VALVE REPLACEMENT, TRANSFEMORAL N/A 06/30/2020   Procedure: TRANSCATHETER AORTIC VALVE REPLACEMENT, TRANSFEMORAL;  Surgeon: Sherren Mocha, MD;  Location: [facility];  Service: Open Heart Surgery;  Laterality: N/A;   Social History   Occupational History  . Occupation:  retired Youth worker: RETIRED  Tobacco Use  . Smoking status: Current Some Day Smoker    Packs/day: 0.75    Years: 55.00    Pack years: 41.25    Types: Cigarettes  . Smokeless tobacco: Never Used  Vaping Use  . Vaping Use: Never used  Substance and Sexual Activity  . Alcohol use: No    Alcohol/week: 0.0 standard drinks  . Drug use: No  . Sexual activity: Yes    Birth control/protection: Condom

## 2020-12-09 ENCOUNTER — Ambulatory Visit: Payer: Medicare PPO | Admitting: Physician Assistant

## 2020-12-15 ENCOUNTER — Ambulatory Visit: Payer: Medicare PPO | Admitting: Orthopedic Surgery

## 2020-12-24 ENCOUNTER — Ambulatory Visit: Payer: Medicare PPO | Admitting: Orthopedic Surgery

## 2020-12-27 ENCOUNTER — Encounter (HOSPITAL_COMMUNITY): Payer: Self-pay | Admitting: Emergency Medicine

## 2020-12-27 ENCOUNTER — Emergency Department (HOSPITAL_COMMUNITY)
Admission: EM | Admit: 2020-12-27 | Discharge: 2020-12-27 | Disposition: A | Discharge status: Home / Self Care | Payer: Medicare PPO | Source: Home / Self Care | Attending: Emergency Medicine | Admitting: Emergency Medicine

## 2020-12-27 ENCOUNTER — Other Ambulatory Visit: Payer: Self-pay

## 2020-12-27 ENCOUNTER — Emergency Department (HOSPITAL_COMMUNITY): Payer: Medicare PPO

## 2020-12-27 DIAGNOSIS — Z7951 Long term (current) use of inhaled steroids: Secondary | ICD-10-CM | POA: Insufficient documentation

## 2020-12-27 DIAGNOSIS — W010XXA Fall on same level from slipping, tripping and stumbling without subsequent striking against object, initial encounter: Secondary | ICD-10-CM | POA: Insufficient documentation

## 2020-12-27 DIAGNOSIS — I10 Essential (primary) hypertension: Secondary | ICD-10-CM | POA: Insufficient documentation

## 2020-12-27 DIAGNOSIS — I5032 Chronic diastolic (congestive) heart failure: Secondary | ICD-10-CM | POA: Insufficient documentation

## 2020-12-27 DIAGNOSIS — Z7982 Long term (current) use of aspirin: Secondary | ICD-10-CM | POA: Insufficient documentation

## 2020-12-27 DIAGNOSIS — I2510 Atherosclerotic heart disease of native coronary artery without angina pectoris: Secondary | ICD-10-CM | POA: Insufficient documentation

## 2020-12-27 DIAGNOSIS — W19XXXA Unspecified fall, initial encounter: Secondary | ICD-10-CM

## 2020-12-27 DIAGNOSIS — Y9301 Activity, walking, marching and hiking: Secondary | ICD-10-CM | POA: Insufficient documentation

## 2020-12-27 DIAGNOSIS — E119 Type 2 diabetes mellitus without complications: Secondary | ICD-10-CM | POA: Insufficient documentation

## 2020-12-27 DIAGNOSIS — I878 Other specified disorders of veins: Secondary | ICD-10-CM

## 2020-12-27 DIAGNOSIS — J449 Chronic obstructive pulmonary disease, unspecified: Secondary | ICD-10-CM | POA: Insufficient documentation

## 2020-12-27 DIAGNOSIS — Z79899 Other long term (current) drug therapy: Secondary | ICD-10-CM | POA: Insufficient documentation

## 2020-12-27 DIAGNOSIS — S81802A Unspecified open wound, left lower leg, initial encounter: Secondary | ICD-10-CM | POA: Insufficient documentation

## 2020-12-27 DIAGNOSIS — Z7902 Long term (current) use of antithrombotics/antiplatelets: Secondary | ICD-10-CM | POA: Insufficient documentation

## 2020-12-27 DIAGNOSIS — M1711 Unilateral primary osteoarthritis, right knee: Secondary | ICD-10-CM | POA: Insufficient documentation

## 2020-12-27 DIAGNOSIS — F17210 Nicotine dependence, cigarettes, uncomplicated: Secondary | ICD-10-CM | POA: Insufficient documentation

## 2020-12-27 DIAGNOSIS — S8001XA Contusion of right knee, initial encounter: Secondary | ICD-10-CM | POA: Insufficient documentation

## 2020-12-27 NOTE — Progress Notes (Signed)
Orthopedic Tech Progress Note Patient Details:  Jordan [name] 1950/09/01 [id number]   Ortho Devices Type of Ortho Device: Knee Sleeve Ortho Device/Splint Location: RLE Ortho Device/Splint Interventions: Ordered, Application, Adjustment   Post Interventions Patient Tolerated: Well Instructions Provided: Care of device, Adjustment of device  [name] Jeri Modena 12/27/2020, 5:55 PM

## 2020-12-27 NOTE — ED Notes (Signed)
Pt noted to have swelling in the lower extremities. Pt reports this is a recurrent problem related to CHF and he does take lasix.

## 2020-12-27 NOTE — ED Provider Notes (Signed)
[facility] DEPT Provider Note   CSN: [id number] Arrival date & time: 12/27/20  1320     History Chief Complaint  Patient presents with   Jordan [name] is a 70 y.o. male.  HPI He is here for evaluation of pain in right knee, somewhat worsened after recent fall.  States he was walking with his walker and fell, but was able to ease himself down.  He feels like he is getting weaker in the upper legs because of his knee problems.  He has been seeing an orthopedist, Dr. Sharol Given for evaluation of right knee pain, and receiving cortisone injections.  He states he is trying to see a different orthopedist so he can get a knee replacement.  He was at the orthopedic clinic most recently on 11/18/2020, and at that time treated for bilateral venous stasis dermatitis.  He wears medical compression stockings, which he states have been on for 2 weeks, currently.  He has trouble getting them on and off so decided to just leave them on.  He denies fever, chills, nausea or vomiting.  There are no other known active modifying factors.    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 Burt Knack and Dr. Cyndia Bent   Severe aortic stenosis    Severe aortic insufficiency, moderate to severe aortic stenosis   Sleep apnea    CPAP    Venous insufficiency    managed by ortho (Dr. Sharol Given)    Patient Active Problem List   Diagnosis Date Noted   S/P TAVR (transcatheter aortic valve replacement)  06/30/2020   Diabetes mellitus without complication (HCC)    COPD (chronic obstructive pulmonary disease) (HCC)    CAD (coronary artery disease)    Severe aortic stenosis    GERD (gastroesophageal reflux disease)    Protein-calorie malnutrition, severe 03/05/2020   Tobacco abuse 03/03/2020   Pressure injury of skin 03/15/2017   Chronic venous insufficiency 04/28/2014   PAD (peripheral artery disease) ([place]) 01/21/2014   Varicose veins of lower extremities with other complications 40/98/1191   Hx of TIA (transient ischemic attack) and stroke 12/31/2013   HLD (hyperlipidemia) 12/31/2013   Severe obesity (BMI >= 40) ([place]) 12/31/2013   Essential hypertension, benign 09/13/2012   Edema 09/13/2012    Past Surgical History:  Procedure Laterality Date   ANKLE SURGERY Right    Dr. Marcelino Scot, has pins   COLONOSCOPY W/ POLYPECTOMY     KNEE ARTHROSCOPY Right    Dr. Noemi Chapel   MULTIPLE EXTRACTIONS WITH ALVEOLOPLASTY Bilateral 05/22/2020   Procedure: MULTIPLE EXTRACTION WITH ALVEOLOPLASTY;  Surgeon: Diona Browner, DMD;  Location: [facility] OR;  Service: Oral Surgery;  Laterality: Bilateral;   right foot surgery,rt great toe callus removal     RIGHT/LEFT HEART CATH AND CORONARY ANGIOGRAPHY N/A 03/06/2020   Procedure: RIGHT/LEFT HEART CATH AND CORONARY ANGIOGRAPHY;  Surgeon: Sherren Mocha, MD;  Location: [facility];  Service: Cardiovascular;  Laterality: N/A;   TEE WITHOUT CARDIOVERSION N/A 03/09/2020   Procedure: TRANSESOPHAGEAL ECHOCARDIOGRAM (TEE);  Surgeon: Dorothy Spark, MD;  Location: [facility] ENDOSCOPY;  Service: Cardiovascular;  Laterality: N/A;   TEE WITHOUT CARDIOVERSION N/A 06/30/2020   Procedure: TRANSESOPHAGEAL ECHOCARDIOGRAM (TEE);  Surgeon: Sherren Mocha, MD;  Location: [facility];  Service: Open Heart Surgery;  Laterality: N/A;   TONSILLECTOMY AND ADENOIDECTOMY     age 53   TRANSCATHETER AORTIC VALVE REPLACEMENT, TRANSFEMORAL N/A 06/30/2020   Procedure: TRANSCATHETER AORTIC VALVE  REPLACEMENT, TRANSFEMORAL;  Surgeon: Sherren Mocha, MD;  Location: [facility];  Service: Open Heart Surgery;  Laterality: N/A;       Family History  Problem Relation Age of Onset   Hypertension Mother    Alzheimer's disease Mother    Stroke Father        x 3   Heart attack Father    Congestive Heart Failure Father    Diabetes Other        mat great aunts   Colon cancer Neg Hx    Rectal cancer Neg Hx    Esophageal cancer Neg Hx     Social History   Tobacco Use   Smoking status: Some Days    Packs/day: 0.75    Years: 55.00    Pack years: 41.25    Types: Cigarettes   Smokeless tobacco: Never  Vaping Use   Vaping Use: Never used  Substance Use Topics   Alcohol use: No    Alcohol/week: 0.0 standard drinks   Drug use: No    Home Medications Prior to Admission medications   Medication Sig Start Date End Date Taking? Authorizing Provider  ascorbic acid (VITAMIN C) 500 MG tablet Take 500 mg by mouth daily.    [provider]  aspirin 81 MG tablet Take 81 mg by mouth daily.    [provider]  atenolol (TENORMIN) 25 MG tablet Take 1 tablet (25 mg total) by mouth daily. 03/18/20   [name] T, PA-C  atorvastatin (LIPITOR) 80 MG tablet Take 80 mg by mouth daily after supper.     [provider]  budesonide-formoterol (SYMBICORT) 160-4.5 MCG/ACT inhaler Inhale 2 puffs into the lungs in the morning and at bedtime. 03/12/20   Aline August, MD  butalbital-acetaminophen-caffeine (FIORICET, ESGIC) 50-325-40 MG per tablet Take 1 tablet by mouth 2 (two) times daily as needed for migraine.     [provider]  celecoxib (CELEBREX) 200 MG capsule Take 1 capsule (200 mg total) by mouth 2 (two) times daily. 09/23/20   Persons, Bevely Palmer, [place]  clopidogrel (PLAVIX) 75 MG tablet Take 1 tablet (75 mg total) by mouth daily with breakfast. 07/03/20   Eileen Stanford, PA-C  folic acid (FOLVITE) 1 MG tablet Take 1 tablet (1 mg total) by mouth daily. 11/04/14    [name], MD  furosemide (LASIX) 40 MG tablet Take 1 tablet (40 mg total) by mouth daily. 03/12/20   Aline August, MD  hydrALAZINE (APRESOLINE) 50 MG tablet Take 1.5 tablets (75 mg total) by mouth 3 (three) times daily. 07/23/20 07/18/21  Eileen Stanford, PA-C  ipratropium-albuterol (DUONEB) 0.5-2.5 (3) MG/3ML SOLN Take 3 mLs by nebulization every 6 (six) hours as needed (shortness of breath/wheezing). 03/12/20   Aline August, MD  LORazepam (ATIVAN) 1 MG tablet Take 0.5 tablets (0.5 mg total) by mouth 2 (two) times daily as needed for anxiety. 03/12/20   Aline August, MD  methocarbamol (  ROBAXIN) 750 MG tablet Take 750 mg by mouth as needed. 06/03/20   [provider]  montelukast (SINGULAIR) 10 MG tablet Take 10 mg by mouth daily. 02/07/20   [provider]  Multiple Vitamin (MULTIVITAMIN WITH MINERALS) TABS Take 1 tablet by mouth daily.    [provider]  nystatin cream (MYCOSTATIN) Apply 1 application topically 2 (two) times daily. 06/24/20   Eileen Stanford, PA-C  oxyCODONE-acetaminophen (PERCOCET) 5-325 MG tablet Take 1 tablet by mouth every 4 (four) hours as needed. 05/22/20   Diona Browner, DMD  pantoprazole (PROTONIX) 40 MG tablet Take 1 tablet (40 mg total) by mouth daily. 07/03/20   Eileen Stanford, PA-C  promethazine (PHENERGAN) 25 MG tablet Take 25 mg by mouth every 8 (eight) hours as needed for nausea/vomiting. 05/14/20   [provider]  venlafaxine XR (EFFEXOR-XR) 75 MG 24 hr capsule Take 75 mg by mouth in the morning and at bedtime. MUST HAVE BRAND NAME ONLY    [provider]    Allergies    Altace [ramipril], Codeine, and Naproxen  Review of Systems   Review of Systems  All other systems reviewed and are negative.  Physical Exam Updated Vital Signs BP 112/74   Pulse 98   Resp 10   Ht 6\' 1"  (1.854 m)   Wt 83.9 kg   SpO2 98%   BMI 24.40 kg/m   Physical Exam Vitals and nursing note reviewed.  Constitutional:       General: He is not in acute distress.    Appearance: He is well-developed. He is not ill-appearing, toxic-appearing or diaphoretic.  HENT:     Head: Normocephalic and atraumatic.     Right Ear: External ear normal.     Left Ear: External ear normal.  Eyes:     Conjunctiva/sclera: Conjunctivae normal.     Pupils: Pupils are equal, round, and reactive to light.  Neck:     Trachea: Phonation normal.  Cardiovascular:     Rate and Rhythm: Normal rate.  Pulmonary:     Effort: Pulmonary effort is normal.  Abdominal:     General: There is no distension.  Musculoskeletal:     Cervical back: Normal range of motion and neck supple.     Comments: Right knee is slightly tender but there is no deformity.  He is able to straight leg raise and extend and flex the right knee, somewhat without significant pain.  Both legs are moderately swollen.  He is wearing compression stockings, bilaterally.  These appear filthy, and there is drainage at an area of the left ankle region which extends through the stocking, and is associated with decrease in the stocking and presumably the leg tissue.  The proximal left calf is erythematous.  Skin:    General: Skin is warm and dry.  Neurological:     Mental Status: He is alert and oriented to person, place, and time.     Cranial Nerves: No cranial nerve deficit.     Sensory: No sensory deficit.     Motor: No abnormal muscle tone.     Coordination: Coordination normal.  Psychiatric:        Mood and Affect: Mood normal.        Behavior: Behavior normal.        Thought Content: Thought content normal.        Judgment: Judgment normal.      ED Results / Procedures / Treatments   Labs (all labs ordered are  listed, but only abnormal results are displayed) Labs Reviewed - No data to display  EKG None  Radiology DG Knee Complete 4 Views Right  Result Date: 12/27/2020 CLINICAL DATA:  Right knee pain following an injury. EXAM: RIGHT KNEE - COMPLETE 4+ VIEW  COMPARISON:  None. FINDINGS: Marked lateral joint space narrowing and associated mild irregularity and sclerosis. Moderate medial joint space narrowing. Tricompartmental spur formation. Moderate-sized effusion. No fracture or dislocation seen. Atheromatous arterial calcifications. IMPRESSION: 1. No fracture or dislocation. 2. Moderate-sized effusion. 3. Tricompartmental degenerative changes. Electronically Signed   By: Claudie Revering M.D.   On: 12/27/2020 15:09    Procedures Procedures   Medications Ordered in ED Medications - No data to display  ED Course  I have reviewed the triage vital signs and the nursing notes.  Pertinent labs & imaging results that were available during my care of the patient were reviewed by me and considered in my medical decision making (see chart for details).  Clinical Course as of 12/27/20 1823  Nancy Fetter Dec 27, 2020  1431 Patient stockings were removed.  The left one could not be removed because it was thickened and unable to be mobilized so it was cut off.  There is an open wound of the left anterior lower leg/ankle region, at the site where the crease was noted before removal stocking.  See representative photo. [EW]    Clinical Course User Index [EW] Daleen Bo, MD   MDM Rules/Calculators/A&P                           Patient Vitals for the past 24 hrs:  BP Pulse Resp SpO2 Height Weight  12/27/20 1745 112/74 98 10 98 % -- --  12/27/20 1730 118/69 99 11 94 % -- --  12/27/20 1630 119/78 89 19 99 % -- --  12/27/20 1615 108/68 -- 15 -- -- --  12/27/20 1600 110/63 92 15 99 % -- --  12/27/20 1536 104/70 (!) 102 12 100 % -- --  12/27/20 1430 104/63 99 16 100 % -- --  12/27/20 1335 -- -- -- -- 6\' 1"  (1.854 m) 83.9 kg    6:02 PM Reevaluation with update and discussion. After initial assessment and treatment, an updated evaluation reveals he remains alert and comfortable.  His lower extremities have been cleansed by nurse, and appear to be much cleaner now.   No active drainage from the left lower leg wound.  There is mild erythema of the left lower leg, nonspecific appearance, but suspected to be related to poor hygiene.  I did not think that this appears to be cellulitis.  Findings were discussed with the patient and all questions were answered. Daleen Bo   Medical Decision Making:  This patient is presenting for evaluation of knee pain after a fall, with wound of left lower leg, which does require a range of treatment options, and is a complaint that involves a moderate risk of morbidity and mortality. The differential diagnoses include right knee injury from fall, chronic edema of legs with poor hygiene leading to secondary problems.  Consideration for metabolic disorder and generalized illness. I decided to review old records, and in summary patient with known chronic arthritis right knee, and lower extremity venous stasis.  He has had difficulty cleaning his legs and changing his compression stockings recently.  He ambulates with a walker..  I did not require additional historical information from anyone.  Radiologic Tests Ordered, included  right knee x-ray.  I independently Visualized: Radiographic images, which show severe tricompartmental degenerative change, no fracture    Critical Interventions-clinical evaluation, radiography, observation, discussion with social work to arrange for outpatient home health services; including PT, OT, nursing, social work, wound care  After These Interventions, the Patient was reevaluated and was found improved and stable for discharge.  Knee brace placed on right knee with patient reporting improvement and comfort.  He feels like he will be able to walk using his walker.  Patient with chronic lower extremity swelling, and wound of the left lower leg, related to him not changing his compression stockings for 2 weeks.  Patient has erythema of the left lower leg but it does not appear to be cellulitis.  I think  this will improve with hygiene and wound care.  Right knee arthritis, currently being managed by orthopedics and PCP.  Home health services initiated to improve his condition.  He will need follow-up with his PCP, as well as orthopedics for ongoing management.  CRITICAL CARE-no Performed by: Daleen Bo  Nursing Notes Reviewed/ Care Coordinated Applicable Imaging Reviewed Interpretation of Laboratory Data incorporated into ED treatment  The patient appears reasonably screened and/or stabilized for discharge and I doubt any other medical condition or other [facility] requiring further screening, evaluation, or treatment in the ED at this time prior to discharge.  Plan: Home Medications-continue usual; Home Treatments-wound care left lower leg daily dressing change, compression stocking right lower leg; return here if the recommended treatment, does not improve the symptoms; Recommended follow up-PCP, 1 week for checkup.  Home health services as soon as possible for ongoing management.  Consider wound care if the leg wound does not improve shortly.     Final Clinical Impression(s) / ED Diagnoses Final diagnoses:  Fall, initial encounter  Contusion of right knee, initial encounter  Arthritis of right knee  Open leg wound, left, initial encounter  Venous stasis    Rx / DC Orders ED Discharge Orders     None        Daleen Bo, MD 12/27/20 [phone]

## 2020-12-27 NOTE — Discharge Instructions (Addendum)
The wound of your left lower leg need to be cleansed daily and a dressing applied, to help it heal.  Do not reapply compression stocking to left leg, until the wound is healed.  You can reapply a new compression stocking to the right leg, when you get home.  Try to elevate your legs above your heart is much as possible to improve the swelling.  We are having home health services come to your home to evaluate and treat your legs, weakness, and help to give you wound care for the left lower leg.  If this does not work you may need to go to the wound care center.  Also following up with your PCP will be helpful.

## 2020-12-27 NOTE — ED Triage Notes (Addendum)
Pt BIBA from home with c/o fall. Pt reports when he was walking the walker "slipped" and he was able to ease his self to the floor using the counter. Pt denies any pain or injuries from the fall. Pt reports he just wants to be evaluated for his reduction of mobility.   HR-120 RR- 18 97% room air T-98.1 90/52

## 2020-12-29 ENCOUNTER — Emergency Department (HOSPITAL_COMMUNITY): Payer: Medicare PPO

## 2020-12-29 ENCOUNTER — Inpatient Hospital Stay (HOSPITAL_COMMUNITY)
Admission: EM | Admit: 2020-12-29 | Discharge: 2021-01-05 | DRG: 602 | Disposition: A | Discharge status: Skilled Nursing Facility | Payer: Medicare PPO | Attending: Internal Medicine | Admitting: Internal Medicine

## 2020-12-29 DIAGNOSIS — Z952 Presence of prosthetic heart valve: Secondary | ICD-10-CM

## 2020-12-29 DIAGNOSIS — W19XXXA Unspecified fall, initial encounter: Secondary | ICD-10-CM | POA: Diagnosis present

## 2020-12-29 DIAGNOSIS — J449 Chronic obstructive pulmonary disease, unspecified: Secondary | ICD-10-CM | POA: Diagnosis not present

## 2020-12-29 DIAGNOSIS — L03116 Cellulitis of left lower limb: Secondary | ICD-10-CM | POA: Diagnosis not present

## 2020-12-29 DIAGNOSIS — D539 Nutritional anemia, unspecified: Secondary | ICD-10-CM | POA: Diagnosis present

## 2020-12-29 DIAGNOSIS — E119 Type 2 diabetes mellitus without complications: Secondary | ICD-10-CM | POA: Diagnosis present

## 2020-12-29 DIAGNOSIS — K219 Gastro-esophageal reflux disease without esophagitis: Secondary | ICD-10-CM | POA: Diagnosis present

## 2020-12-29 DIAGNOSIS — S91302A Unspecified open wound, left foot, initial encounter: Secondary | ICD-10-CM

## 2020-12-29 DIAGNOSIS — F17210 Nicotine dependence, cigarettes, uncomplicated: Secondary | ICD-10-CM | POA: Diagnosis present

## 2020-12-29 DIAGNOSIS — I2510 Atherosclerotic heart disease of native coronary artery without angina pectoris: Secondary | ICD-10-CM | POA: Diagnosis present

## 2020-12-29 DIAGNOSIS — Z20822 Contact with and (suspected) exposure to covid-19: Secondary | ICD-10-CM | POA: Diagnosis present

## 2020-12-29 DIAGNOSIS — I10 Essential (primary) hypertension: Secondary | ICD-10-CM | POA: Diagnosis not present

## 2020-12-29 DIAGNOSIS — I4891 Unspecified atrial fibrillation: Secondary | ICD-10-CM | POA: Diagnosis not present

## 2020-12-29 DIAGNOSIS — E785 Hyperlipidemia, unspecified: Secondary | ICD-10-CM | POA: Diagnosis present

## 2020-12-29 DIAGNOSIS — I959 Hypotension, unspecified: Secondary | ICD-10-CM | POA: Diagnosis not present

## 2020-12-29 DIAGNOSIS — S8001XA Contusion of right knee, initial encounter: Secondary | ICD-10-CM | POA: Diagnosis present

## 2020-12-29 DIAGNOSIS — Z820 Family history of epilepsy and other diseases of the nervous system: Secondary | ICD-10-CM

## 2020-12-29 DIAGNOSIS — I480 Paroxysmal atrial fibrillation: Secondary | ICD-10-CM | POA: Diagnosis not present

## 2020-12-29 DIAGNOSIS — Z515 Encounter for palliative care: Secondary | ICD-10-CM

## 2020-12-29 DIAGNOSIS — I872 Venous insufficiency (chronic) (peripheral): Secondary | ICD-10-CM | POA: Diagnosis present

## 2020-12-29 DIAGNOSIS — Z7902 Long term (current) use of antithrombotics/antiplatelets: Secondary | ICD-10-CM

## 2020-12-29 DIAGNOSIS — M1711 Unilateral primary osteoarthritis, right knee: Secondary | ICD-10-CM | POA: Diagnosis present

## 2020-12-29 DIAGNOSIS — M1990 Unspecified osteoarthritis, unspecified site: Secondary | ICD-10-CM | POA: Diagnosis not present

## 2020-12-29 DIAGNOSIS — I5030 Unspecified diastolic (congestive) heart failure: Secondary | ICD-10-CM | POA: Diagnosis not present

## 2020-12-29 DIAGNOSIS — F32A Depression, unspecified: Secondary | ICD-10-CM | POA: Diagnosis present

## 2020-12-29 DIAGNOSIS — Z886 Allergy status to analgesic agent status: Secondary | ICD-10-CM | POA: Diagnosis not present

## 2020-12-29 DIAGNOSIS — I110 Hypertensive heart disease with heart failure: Secondary | ICD-10-CM | POA: Diagnosis present

## 2020-12-29 DIAGNOSIS — I5032 Chronic diastolic (congestive) heart failure: Secondary | ICD-10-CM | POA: Diagnosis not present

## 2020-12-29 DIAGNOSIS — E782 Mixed hyperlipidemia: Secondary | ICD-10-CM | POA: Diagnosis not present

## 2020-12-29 DIAGNOSIS — D649 Anemia, unspecified: Secondary | ICD-10-CM

## 2020-12-29 DIAGNOSIS — R7401 Elevation of levels of liver transaminase levels: Secondary | ICD-10-CM | POA: Diagnosis present

## 2020-12-29 DIAGNOSIS — Y9301 Activity, walking, marching and hiking: Secondary | ICD-10-CM | POA: Diagnosis present

## 2020-12-29 DIAGNOSIS — E669 Obesity, unspecified: Secondary | ICD-10-CM | POA: Diagnosis present

## 2020-12-29 DIAGNOSIS — N400 Enlarged prostate without lower urinary tract symptoms: Secondary | ICD-10-CM | POA: Diagnosis present

## 2020-12-29 DIAGNOSIS — I5033 Acute on chronic diastolic (congestive) heart failure: Secondary | ICD-10-CM | POA: Diagnosis present

## 2020-12-29 DIAGNOSIS — S91302D Unspecified open wound, left foot, subsequent encounter: Secondary | ICD-10-CM | POA: Diagnosis not present

## 2020-12-29 DIAGNOSIS — F419 Anxiety disorder, unspecified: Secondary | ICD-10-CM | POA: Diagnosis present

## 2020-12-29 DIAGNOSIS — Z79899 Other long term (current) drug therapy: Secondary | ICD-10-CM

## 2020-12-29 DIAGNOSIS — Z885 Allergy status to narcotic agent status: Secondary | ICD-10-CM

## 2020-12-29 DIAGNOSIS — I352 Nonrheumatic aortic (valve) stenosis with insufficiency: Secondary | ICD-10-CM | POA: Diagnosis present

## 2020-12-29 DIAGNOSIS — Z7951 Long term (current) use of inhaled steroids: Secondary | ICD-10-CM

## 2020-12-29 DIAGNOSIS — R0600 Dyspnea, unspecified: Secondary | ICD-10-CM

## 2020-12-29 DIAGNOSIS — Z833 Family history of diabetes mellitus: Secondary | ICD-10-CM

## 2020-12-29 DIAGNOSIS — Z7982 Long term (current) use of aspirin: Secondary | ICD-10-CM

## 2020-12-29 DIAGNOSIS — R609 Edema, unspecified: Secondary | ICD-10-CM

## 2020-12-29 DIAGNOSIS — Z8249 Family history of ischemic heart disease and other diseases of the circulatory system: Secondary | ICD-10-CM

## 2020-12-29 DIAGNOSIS — Z720 Tobacco use: Secondary | ICD-10-CM | POA: Diagnosis not present

## 2020-12-29 DIAGNOSIS — I4819 Other persistent atrial fibrillation: Secondary | ICD-10-CM | POA: Diagnosis present

## 2020-12-29 DIAGNOSIS — R7989 Other specified abnormal findings of blood chemistry: Secondary | ICD-10-CM

## 2020-12-29 DIAGNOSIS — M25561 Pain in right knee: Secondary | ICD-10-CM | POA: Diagnosis present

## 2020-12-29 DIAGNOSIS — I83009 Varicose veins of unspecified lower extremity with ulcer of unspecified site: Secondary | ICD-10-CM | POA: Diagnosis present

## 2020-12-29 DIAGNOSIS — Z823 Family history of stroke: Secondary | ICD-10-CM

## 2020-12-29 DIAGNOSIS — Z7189 Other specified counseling: Secondary | ICD-10-CM | POA: Diagnosis not present

## 2020-12-29 DIAGNOSIS — Z7901 Long term (current) use of anticoagulants: Secondary | ICD-10-CM

## 2020-12-29 DIAGNOSIS — R600 Localized edema: Secondary | ICD-10-CM

## 2020-12-29 LAB — URINALYSIS, ROUTINE W REFLEX MICROSCOPIC
Bilirubin Urine: NEGATIVE
Glucose, UA: NEGATIVE mg/dL
Hgb urine dipstick: NEGATIVE
Ketones, ur: NEGATIVE mg/dL
Leukocytes,Ua: NEGATIVE
Nitrite: NEGATIVE
Protein, ur: NEGATIVE mg/dL
Specific Gravity, Urine: 1.006 (ref 1.005–1.030)
pH: 5 (ref 5.0–8.0)

## 2020-12-29 LAB — COMPREHENSIVE METABOLIC PANEL
ALT: 22 U/L (ref 0–44)
AST: 45 U/L — ABNORMAL HIGH (ref 15–41)
Albumin: 3.1 g/dL — ABNORMAL LOW (ref 3.5–5.0)
Alkaline Phosphatase: 110 U/L (ref 38–126)
Anion gap: 7 (ref 5–15)
BUN: 23 mg/dL (ref 8–23)
CO2: 24 mmol/L (ref 22–32)
Calcium: 8.4 mg/dL — ABNORMAL LOW (ref 8.9–10.3)
Chloride: 102 mmol/L (ref 98–111)
Creatinine, Ser: 1.38 mg/dL — ABNORMAL HIGH (ref 0.61–1.24)
GFR, Estimated: 55 mL/min — ABNORMAL LOW (ref >60)
Glucose, Bld: 94 mg/dL (ref 70–99)
Potassium: 4.3 mmol/L (ref 3.5–5.1)
Sodium: 133 mmol/L — ABNORMAL LOW (ref 135–145)
Total Bilirubin: 0.8 mg/dL (ref 0.3–1.2)
Total Protein: 5.9 g/dL — ABNORMAL LOW (ref 6.5–8.1)

## 2020-12-29 LAB — CBC WITH DIFFERENTIAL/PLATELET
Abs Immature Granulocytes: 0.03 10*3/uL (ref 0.00–0.07)
Basophils Absolute: 0 10*3/uL (ref 0.0–0.1)
Basophils Relative: 0 %
Eosinophils Absolute: 0.1 10*3/uL (ref 0.0–0.5)
Eosinophils Relative: 2 %
HCT: 27.2 % — ABNORMAL LOW (ref 39.0–52.0)
Hemoglobin: 8.4 g/dL — ABNORMAL LOW (ref 13.0–17.0)
Immature Granulocytes: 0 %
Lymphocytes Relative: 8 %
Lymphs Abs: 0.6 10*3/uL — ABNORMAL LOW (ref 0.7–4.0)
MCH: 31.1 pg (ref 26.0–34.0)
MCHC: 30.9 g/dL (ref 30.0–36.0)
MCV: 100.7 fL — ABNORMAL HIGH (ref 80.0–100.0)
Monocytes Absolute: 0.7 10*3/uL (ref 0.1–1.0)
Monocytes Relative: 9 %
Neutro Abs: 6.1 10*3/uL (ref 1.7–7.7)
Neutrophils Relative %: 81 %
Platelets: 247 10*3/uL (ref 150–400)
RBC: 2.7 MIL/uL — ABNORMAL LOW (ref 4.22–5.81)
RDW: 18.4 % — ABNORMAL HIGH (ref 11.5–15.5)
WBC: 7.5 10*3/uL (ref 4.0–10.5)
nRBC: 0 % (ref 0.0–0.2)

## 2020-12-29 LAB — RESP PANEL BY RT-PCR (FLU A&B, COVID) ARPGX2
Influenza A by PCR: NEGATIVE
Influenza B by PCR: NEGATIVE
SARS Coronavirus 2 by RT PCR: NEGATIVE

## 2020-12-29 LAB — APTT: aPTT: 48 seconds — ABNORMAL HIGH (ref 24–36)

## 2020-12-29 LAB — LACTIC ACID, PLASMA: Lactic Acid, Venous: 1.3 mmol/L (ref 0.5–1.9)

## 2020-12-29 LAB — PROTIME-INR
INR: 1 (ref 0.8–1.2)
Prothrombin Time: 13.2 seconds (ref 11.4–15.2)

## 2020-12-29 MED ORDER — SODIUM CHLORIDE 0.9 % IV SOLN
3.0000 g | Freq: Once | INTRAVENOUS | Status: AC
  Administered 2020-12-29: 3 g via INTRAVENOUS
  Filled 2020-12-29: qty 8

## 2020-12-29 MED ORDER — ENOXAPARIN SODIUM 40 MG/0.4ML IJ SOSY
40.0000 mg | PREFILLED_SYRINGE | INTRAMUSCULAR | Status: DC
  Administered 2020-12-29 – 2020-12-31 (×3): 40 mg via SUBCUTANEOUS
  Filled 2020-12-29 (×3): qty 0.4

## 2020-12-29 MED ORDER — LACTATED RINGERS IV BOLUS (SEPSIS)
1000.0000 mL | Freq: Once | INTRAVENOUS | Status: AC
  Administered 2020-12-29: 1000 mL via INTRAVENOUS

## 2020-12-29 MED ORDER — ENOXAPARIN SODIUM 40 MG/0.4ML IJ SOSY: 40.0000 mg | PREFILLED_SYRINGE | INTRAMUSCULAR | Status: DC

## 2020-12-29 NOTE — ED Provider Notes (Signed)
[facility] DEPT Provider Note   CSN: [id number] Arrival date & time: 12/29/20  1744     History Chief complaint: Leg swelling, weakness, possible infection  Jordan [name] is a 70 y.o. male.  HPI  Patient presents to the ED for evaluation of leg swelling weakness and concerns for cellulitis.  Patient has a history of multiple medical problems as indicated below.  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  Patient has been having issues with knee pain that has been making it hard for him to walk.  He has been seeing Dr. Sharol Given from orthopedics.  Patient had an episode of a fall over the weekend.  He was actually evaluated in the emergency room on July 10.  Patient states during that visit he was noted to have swelling of his legs.  Notes indicate the patient had been wearing compression stockings for couple of weeks.  They had to cut them off.  Patient did have a wound but at that time was not felt to have signs of cellulitis.  Patient states he continues to feel generally weak.  He is not having any trouble with fevers or chills.  Went to his primary care doctor's office today and EMS was called and he was sent to the ED.  No records are available from the doctor's office.  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 Burt Knack and Dr. Cyndia Bent   Severe aortic stenosis    Severe aortic insufficiency, moderate to severe aortic stenosis    Sleep apnea    CPAP    Venous insufficiency    managed by ortho (Dr. 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   Diabetes mellitus without complication (HCC)    COPD (chronic obstructive pulmonary disease) (HCC)    CAD (coronary artery disease)    Severe aortic stenosis    GERD (gastroesophageal reflux disease)    Protein-calorie malnutrition, severe 03/05/2020   Tobacco abuse 03/03/2020   Pressure injury of skin 03/15/2017   Chronic venous insufficiency 04/28/2014   PAD (peripheral artery disease) ([place]) 01/21/2014   Varicose veins of lower extremities with other complications 66/29/4765   Hx of TIA (transient ischemic attack) and stroke 12/31/2013   HLD (hyperlipidemia) 12/31/2013   Severe obesity (BMI >= 40) ([place]) 12/31/2013   Essential hypertension, benign 09/13/2012   Edema 09/13/2012    Past Surgical History:  Procedure Laterality Date   ANKLE SURGERY Right    Dr. Marcelino Scot, has pins   COLONOSCOPY W/ POLYPECTOMY     KNEE ARTHROSCOPY Right    Dr. Noemi Chapel   MULTIPLE EXTRACTIONS WITH ALVEOLOPLASTY Bilateral 05/22/2020   Procedure: MULTIPLE EXTRACTION WITH ALVEOLOPLASTY;  Surgeon: Diona Browner, DMD;  Location: [facility];  Service:  Oral Surgery;  Laterality: Bilateral;   right foot surgery,rt great toe callus removal     RIGHT/LEFT HEART CATH AND CORONARY ANGIOGRAPHY N/A 03/06/2020   Procedure: RIGHT/LEFT HEART CATH AND CORONARY ANGIOGRAPHY;  Surgeon: Sherren Mocha, MD;  Location: [facility];  Service: Cardiovascular;  Laterality: N/A;   TEE WITHOUT CARDIOVERSION N/A 03/09/2020   Procedure: TRANSESOPHAGEAL ECHOCARDIOGRAM (TEE);  Surgeon: Dorothy Spark, MD;  Location: [facility];  Service: Cardiovascular;  Laterality: N/A;   TEE WITHOUT CARDIOVERSION N/A 06/30/2020   Procedure: TRANSESOPHAGEAL ECHOCARDIOGRAM (TEE);  Surgeon: Sherren Mocha, MD;  Location:  [facility];  Service: Open Heart Surgery;  Laterality: N/A;   TONSILLECTOMY AND ADENOIDECTOMY     age 53   TRANSCATHETER AORTIC VALVE REPLACEMENT, TRANSFEMORAL N/A 06/30/2020   Procedure: TRANSCATHETER AORTIC VALVE REPLACEMENT, TRANSFEMORAL;  Surgeon: Sherren Mocha, MD;  Location: [facility];  Service: Open Heart Surgery;  Laterality: N/A;       Family History  Problem Relation Age of Onset   Hypertension Mother    Alzheimer's disease Mother    Stroke Father        x 3   Heart attack Father    Congestive Heart Failure Father    Diabetes Other        mat great aunts   Colon cancer Neg Hx    Rectal cancer Neg Hx    Esophageal cancer Neg Hx     Social History   Tobacco Use   Smoking status: Some Days    Packs/day: 0.75    Years: 55.00    Pack years: 41.25    Types: Cigarettes   Smokeless tobacco: Never  Vaping Use   Vaping Use: Never used  Substance Use Topics   Alcohol use: No    Alcohol/week: 0.0 standard drinks   Drug use: No    Home Medications Prior to Admission medications   Medication Sig Start Date End Date Taking? Authorizing Provider  aspirin 81 MG tablet Take 81 mg by mouth daily.   Yes [provider]  atenolol (TENORMIN) 25 MG tablet Take 1 tablet (25 mg total) by mouth daily. 03/18/20  Yes Weaver, [name] T, PA-C  atorvastatin (LIPITOR) 80 MG tablet Take 80 mg by mouth daily after supper.    Yes [provider]  butalbital-acetaminophen-caffeine (FIORICET, ESGIC) 50-325-40 MG per tablet Take 1 tablet by mouth 2 (two) times daily as needed for migraine.    Yes [provider]  ibuprofen (ADVIL) 200 MG tablet Take 400 mg by mouth every 6 (six) hours as needed (for headaches).   Yes [provider]  budesonide-formoterol (SYMBICORT) 160-4.5 MCG/ACT inhaler Inhale 2 puffs into the lungs in the morning and at bedtime. 03/12/20   Aline August, MD  celecoxib (CELEBREX) 200 MG capsule Take 1 capsule (200 mg total) by mouth 2 (two) times  daily. 09/23/20   Persons, Bevely Palmer, [place]  clopidogrel (PLAVIX) 75 MG tablet Take 1 tablet (75 mg total) by mouth daily with breakfast. 07/03/20   Eileen Stanford, PA-C  folic acid (FOLVITE) 1 MG tablet Take 1 tablet (1 mg total) by mouth daily. 11/04/14   [name], MD  furosemide (LASIX) 40 MG tablet Take 1 tablet (40 mg total) by mouth daily. 03/12/20   Aline August, MD  hydrALAZINE (APRESOLINE) 50 MG tablet Take 1.5 tablets (75 mg total) by mouth 3 (three) times daily. 07/23/20 07/18/21  Eileen Stanford, PA-C  ipratropium-albuterol (DUONEB) 0.5-2.5 (3) MG/3ML SOLN Take 3 mLs by  nebulization every 6 (six) hours as needed (shortness of breath/wheezing). 03/12/20   Aline August, MD  LORazepam (ATIVAN) 1 MG tablet Take 0.5 tablets (0.5 mg total) by mouth 2 (two) times daily as needed for anxiety. 03/12/20   Aline August, MD  methocarbamol (ROBAXIN) 750 MG tablet Take 750 mg by mouth as needed. 06/03/20   [provider]  montelukast (SINGULAIR) 10 MG tablet Take 10 mg by mouth daily. 02/07/20   [provider]  Multiple Vitamin (MULTIVITAMIN WITH MINERALS) TABS Take 1 tablet by mouth daily.    [provider]  nystatin cream (MYCOSTATIN) Apply 1 application topically 2 (two) times daily. 06/24/20   Eileen Stanford, PA-C  oxyCODONE-acetaminophen (PERCOCET) 5-325 MG tablet Take 1 tablet by mouth every 4 (four) hours as needed. 05/22/20   Diona Browner, DMD  pantoprazole (PROTONIX) 40 MG tablet Take 1 tablet (40 mg total) by mouth daily. 07/03/20   Eileen Stanford, PA-C  promethazine (PHENERGAN) 25 MG tablet Take 25 mg by mouth every 8 (eight) hours as needed for nausea/vomiting. 05/14/20   [provider]  tamsulosin (FLOMAX) 0.4 MG CAPS capsule Take 0.4 mg by mouth daily. 07/19/20   [provider]  venlafaxine XR (EFFEXOR-XR) 75 MG 24 hr capsule Take 75 mg by mouth in the morning and at bedtime. MUST HAVE BRAND NAME ONLY    [provider]     Allergies    Altace [ramipril], Codeine, and Naproxen  Review of Systems   Review of Systems  All other systems reviewed and are negative.  Physical Exam Updated Vital Signs BP (!) 100/54   Pulse 89   Temp 97.6 F (36.4 C) (Oral)   Resp 14   Ht 1.829 m (6')   Wt 102.1 kg   SpO2 99%   BMI 30.52 kg/m   Physical Exam Vitals and nursing note reviewed.  Constitutional:      Appearance: He is well-developed. He is not diaphoretic.  HENT:     Head: Normocephalic and atraumatic.     Right Ear: External ear normal.     Left Ear: External ear normal.  Eyes:     General: No scleral icterus.       Right eye: No discharge.        Left eye: No discharge.     Conjunctiva/sclera: Conjunctivae normal.  Neck:     Trachea: No tracheal deviation.  Cardiovascular:     Rate and Rhythm: Normal rate and regular rhythm.  Pulmonary:     Effort: Pulmonary effort is normal. No respiratory distress.     Breath sounds: Normal breath sounds. No stridor. No wheezing or rales.  Abdominal:     General: Bowel sounds are normal. There is no distension.     Palpations: Abdomen is soft.     Tenderness: There is no abdominal tenderness. There is no guarding or rebound.  Musculoskeletal:        General: Swelling present. No tenderness or deformity.     Cervical back: Neck supple.     Right lower leg: Edema present.     Left lower leg: Edema present.     Comments: Right knee brace, signs of venous stasis dermatitis right lower leg; left lower leg with Coban pressure dressing, erythema noted above the dressing, increased warmth, wound noted left big toe, no signs of necrosis  Skin:    General: Skin is warm and dry.     Findings: No rash.  Neurological:  General: No focal deficit present.     Mental Status: He is alert.     Cranial Nerves: No cranial nerve deficit (no facial droop, extraocular movements intact, no slurred speech).     Sensory: No sensory deficit.     Motor: No abnormal muscle  tone or seizure activity.     Coordination: Coordination normal.  Psychiatric:        Mood and Affect: Mood normal.    ED Results / Procedures / Treatments   Labs (all labs ordered are listed, but only abnormal results are displayed) Labs Reviewed  COMPREHENSIVE METABOLIC PANEL - Abnormal; Notable for the following components:      Result Value   Sodium 133 (*)    Creatinine, Ser 1.38 (*)    Calcium 8.4 (*)    Total Protein 5.9 (*)    Albumin 3.1 (*)    AST 45 (*)    GFR, Estimated 55 (*)    All other components within normal limits  CBC WITH DIFFERENTIAL/PLATELET - Abnormal; Notable for the following components:   RBC 2.70 (*)    Hemoglobin 8.4 (*)    HCT 27.2 (*)    MCV 100.7 (*)    RDW 18.4 (*)    Lymphs Abs 0.6 (*)    All other components within normal limits  APTT - Abnormal; Notable for the following components:   aPTT 48 (*)    All other components within normal limits  URINE CULTURE  CULTURE, BLOOD (ROUTINE X 2)  CULTURE, BLOOD (ROUTINE X 2)  RESP PANEL BY RT-PCR (FLU A&B, COVID) ARPGX2  LACTIC ACID, PLASMA  PROTIME-INR  URINALYSIS, ROUTINE W REFLEX MICROSCOPIC  BRAIN NATRIURETIC PEPTIDE    EKG EKG Interpretation  Date/Time:  Tuesday December 29 2020 18:07:16 EDT Ventricular Rate:  91 PR Interval:    QRS Duration: 88 QT Interval:  372 QTC Calculation: 457 R Axis:   79 Text Interpretation: Undetermined rhythm Nonspecific T wave abnormality Abnormal ECG Poor data quality Confirmed by Dorie Rank [id number]) on 12/29/2020 6:19:58 PM  Radiology DG Chest Port 1 View  Result Date: 12/29/2020 CLINICAL DATA:  Concern for sepsis EXAM: PORTABLE CHEST 1 VIEW COMPARISON:  CT 08/06/2020, radiograph 06/26/2020 FINDINGS: There are increasing hazy opacities in the lungs. These partially obscure the costophrenic sulci though difficult to fully assess given collimation of the lung bases due to difficulty with patient positioning. Overall, appearance is increased from comparison  prior and could reflect developing edema and or atelectasis with airspace disease less favored though not completely excluded. Pulmonary vascularity is slightly congested. Cardiomediastinal contours may be accentuated by the portable technique. Transcatheter aortic valve replacement. Aortic atherosclerosis. No acute osseous or soft tissue abnormality. Degenerative changes are present in the imaged spine and shoulders. IMPRESSION: Increasing hazy basilar opacities and pulmonary vascular congestion may reflect some developing edema and or atelectasis in the lung bases though underlying airspace disease is difficult to fully exclude particularly in the setting of possible sepsis. Aortic Atherosclerosis (ICD10-I70.0). Suboptimal imaging given difficulties with patient positioning. Electronically Signed   By: Lovena Le M.D.   On: 12/29/2020 19:59   DG Foot Complete Left  Result Date: 12/29/2020 CLINICAL DATA:  Left leg pain and swelling, initial encounter EXAM: LEFT FOOT - COMPLETE 3+ VIEW COMPARISON:  04/19/2019, 11/18/2020 FINDINGS: Postsurgical changes are again noted in the medial malleolus stable in appearance from the prior exam. Tarsal degenerative changes are noted. Calcaneal spurring is seen. Flattening of the plantar arch is noted. Soft tissue  ulcer is noted along the anterior aspect of the distal lower leg. No other ulcerations are seen. No acute fracture is noted. IMPRESSION: Chronic changes as described. Soft tissue wound is noted along the anterior aspect of the lower left leg. Electronically Signed   By: Inez Catalina M.D.   On: 12/29/2020 20:02    Procedures Procedures   Medications Ordered in ED Medications  lactated ringers bolus 1,000 mL (1,000 mLs Intravenous New Bag/Given 12/29/20 2234)  Ampicillin-Sulbactam (UNASYN) 3 g in sodium chloride 0.9 % 100 mL IVPB (3 g Intravenous New Bag/Given 12/29/20 2234)  lactated ringers bolus 1,000 mL (0 mLs Intravenous Stopped 12/29/20 2019)    ED  Course  I have reviewed the triage vital signs and the nursing notes.  Pertinent labs & imaging results that were available during my care of the patient were reviewed by me and considered in my medical decision making (see chart for details).  Clinical Course as of 12/29/20 2254  Tue Dec 29, 2020  1438 Initial blood pressure is low.  Patient is afebrile.  Will evaluate for possible evolving sepsis.  [JK]  1918 Blood pressure is improved to 104/54.  Pulse 97 [JK]  2222 Blood pressure decreased again at 98.  Initial lactic acid level is not elevated.  White blood cell count is not elevated. [OI]  7579 Doubt evolving sepsis at this time but will order another fluid bolus and admit for IV antibiotics [JK]    Clinical Course User Index [JK] Dorie Rank, MD   MDM Rules/Calculators/A&P                          Patient presented to the ED for concerns of cellulitis and possible sepsis.  Patient did have evidence of cellulitis on exam with increased warmth of the lower extremity.  X-rays were performed no signs to suggest osteomyelitis.  Patient did have transient hypotension but that improved with fluids.  He does not have lactic acidosis leukocytosis or fever.  I doubt evolving sepsis at this time.  Patient does have some chronic edema associated with congestive heart failure but does appear to have a component of cellulitis.  With his worsening symptoms and transient hypotension related mid to the hospital for further treatment and evaluation. Final Clinical Impression(s) / ED Diagnoses Final diagnoses:  Cellulitis of left lower extremity  Peripheral edema  Anemia, unspecified type     Dorie Rank, MD 12/29/20 2254

## 2020-12-29 NOTE — Progress Notes (Addendum)
12/29/2020 250 pm Pt had [facility] in the past for [facility]. Spoke to pt's sister and states he was at an appt and states he usually not good with answering the phone. He has hired a caregiver from his Long term Adult nurse that comes from 11-4:30 pm. Has RW and cane at home. Sister, Tilda Burrow states she will give him the message to be looking out for [facility] to call. Contacted [facility] rep, Tommi Rumps and they can accept referral for [facility]. ED provider updated. Jonnie Finner RN CCM, [facility] ED TOC CM [phone]   12/29/2020 2 pm TOC CM attempted call to pt and not able to leave message to arrange [facility] [place], [facility] ED TOC CM [phone]

## 2020-12-29 NOTE — ED Notes (Signed)
Patient is finished eating and was repositioned in bed.

## 2020-12-29 NOTE — ED Triage Notes (Signed)
Ems brings pt in from PCP office for leg swelling and weeping edema. PCP concerned for infection. Pt complains of bilateral leg pain.

## 2020-12-29 NOTE — H&P (Addendum)
History and Physical  Jordan [name] LPF:[id number] DOB: [birth year] DOA: 12/29/2020  Referring physician: Dorie Rank, MD PCP: [name], MD  Patient coming from: Home  Chief Complaint: Leg swelling and weakness  HPI: Jordan [name] is a 70 y.o. 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  Patient follows with Dr. Sharol Given (orthopedics) due to worsening right knee arthritis, he had a fall over the weekend and he was seen in the ED on July 10 and was noted to have a right knee contusion with open wound left leg wound and was discharged home to continue with home medications.  [facility] went to [facility] office today and EMS was activated for patient to be taken to the ED. denies fever, chills, nausea, vomiting, chest pain or shortness of breath.  ED Course:  In the emergency department, BP was soft and was intermittently tachycardic.  Work-up in the ED showed macrocytic anemia, BUN to creatinine 23/1.38 (baseline creatinine 1.0-1.3).  Influenza A, B, SARS coronavirus 2 was negative. Left foot x-ray showed chronic changes as described. Soft tissue wound is noted along the anterior aspect of the lower left leg. Chest x-ray showed Increasing hazy basilar opacities and pulmonary vascular congestion may reflect some developing edema and or atelectasis in the lung bases Patient was treated with Unasyn due to presumed cellulitis, IV hydration was provided.  Hospitalist was asked to admit patient for further evaluation and management.  Review of Systems: Constitutional: Negative for chills and fever.  HENT: Negative for ear pain  and sore throat.   Eyes: Negative for pain and visual disturbance.  Respiratory: Negative for cough, chest tightness and shortness of breath.   Cardiovascular: Negative for chest pain and palpitations.  Gastrointestinal: Negative for abdominal pain and vomiting.  Endocrine: Negative for polyphagia and polyuria.  Genitourinary: Negative for decreased urine volume, dysuria, enuresis Musculoskeletal: Positive for right knee pain and weakness.  Positive for left leg swelling, redness and pain.   Skin: Negative for color change and rash.  Allergic/Immunologic: Negative for immunocompromised state.  Neurological: Negative for tremors, syncope, speech difficulty, weakness, light-headedness and headaches.  Hematological: Does not bruise/bleed easily.  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 Burt Knack and Dr. Cyndia Bent   Severe aortic stenosis    Severe aortic insufficiency, moderate to severe aortic stenosis   Sleep apnea    CPAP    Venous insufficiency    managed by ortho (Dr. Sharol Given)   Past Surgical History:  Procedure Laterality Date   ANKLE SURGERY Right  Dr. Marcelino Scot, has pins   COLONOSCOPY W/ POLYPECTOMY     KNEE ARTHROSCOPY Right    Dr. Noemi Chapel   MULTIPLE EXTRACTIONS WITH ALVEOLOPLASTY Bilateral 05/22/2020   Procedure: MULTIPLE EXTRACTION WITH ALVEOLOPLASTY;  Surgeon: Diona Browner, DMD;  Location: [facility];  Service: Oral Surgery;  Laterality: Bilateral;    right foot surgery,rt great toe callus removal     RIGHT/LEFT HEART CATH AND CORONARY ANGIOGRAPHY N/A 03/06/2020   Procedure: RIGHT/LEFT HEART CATH AND CORONARY ANGIOGRAPHY;  Surgeon: Sherren Mocha, MD;  Location: [facility];  Service: Cardiovascular;  Laterality: N/A;   TEE WITHOUT CARDIOVERSION N/A 03/09/2020   Procedure: TRANSESOPHAGEAL ECHOCARDIOGRAM (TEE);  Surgeon: Dorothy Spark, MD;  Location: [facility] ENDOSCOPY;  Service: Cardiovascular;  Laterality: N/A;   TEE WITHOUT CARDIOVERSION N/A 06/30/2020   Procedure: TRANSESOPHAGEAL ECHOCARDIOGRAM (TEE);  Surgeon: Sherren Mocha, MD;  Location: [facility];  Service: Open Heart Surgery;  Laterality: N/A;   TONSILLECTOMY AND ADENOIDECTOMY     age 54   TRANSCATHETER AORTIC VALVE REPLACEMENT, TRANSFEMORAL N/A 06/30/2020   Procedure: TRANSCATHETER AORTIC VALVE REPLACEMENT, TRANSFEMORAL;  Surgeon: Sherren Mocha, MD;  Location: [place];  Service: Open Heart Surgery;  Laterality: N/A;    Social History:  reports that he has been smoking cigarettes. He has a 41.25 pack-year smoking history. He has never used smokeless tobacco. He reports that he does not drink alcohol and does not use drugs.   Allergies  Allergen Reactions   Altace [Ramipril] Other (See Comments)    Dizziness, migraine, weakness- "there is dye in it"   Codeine Itching   Naproxen Itching    Family History  Problem Relation Age of Onset   Hypertension Mother    Alzheimer's disease Mother    Stroke Father        x 3   Heart attack Father    Congestive Heart Failure Father    Diabetes Other        mat great aunts   Colon cancer Neg Hx    Rectal cancer Neg Hx    Esophageal cancer Neg Hx      Prior to Admission medications   Medication Sig Start Date End Date Taking? Authorizing Provider  aspirin 81 MG tablet Take 81 mg by mouth daily.   Yes [provider]  atenolol (TENORMIN) 25 MG tablet Take 1 tablet (25 mg total) by mouth daily. 03/18/20  Yes Weaver, [name]  T, PA-C  atorvastatin (LIPITOR) 80 MG tablet Take 80 mg by mouth daily after supper.    Yes [provider]  butalbital-acetaminophen-caffeine (FIORICET, ESGIC) 50-325-40 MG per tablet Take 1 tablet by mouth 2 (two) times daily as needed for migraine.    Yes [provider]  ibuprofen (ADVIL) 200 MG tablet Take 400 mg by mouth every 6 (six) hours as needed (for headaches).   Yes [provider]  budesonide-formoterol (SYMBICORT) 160-4.5 MCG/ACT inhaler Inhale 2 puffs into the lungs in the morning and at bedtime. 03/12/20   Aline August, MD  celecoxib (CELEBREX) 200 MG capsule Take 1 capsule (200 mg total) by mouth 2 (two) times daily. 09/23/20   Persons, Bevely Palmer, [place]  clopidogrel (PLAVIX) 75 MG tablet Take 1 tablet (75 mg total) by mouth daily with breakfast. 07/03/20   Eileen Stanford, PA-C  folic acid (FOLVITE) 1 MG tablet Take 1 tablet (1 mg total) by mouth daily. 11/04/14   [name], MD  furosemide (LASIX) 40 MG tablet Take 1 tablet (40 mg total) by mouth  daily. 03/12/20   Aline August, MD  hydrALAZINE (APRESOLINE) 50 MG tablet Take 1.5 tablets (75 mg total) by mouth 3 (three) times daily. 07/23/20 07/18/21  Eileen Stanford, PA-C  ipratropium-albuterol (DUONEB) 0.5-2.5 (3) MG/3ML SOLN Take 3 mLs by nebulization every 6 (six) hours as needed (shortness of breath/wheezing). 03/12/20   Aline August, MD  LORazepam (ATIVAN) 1 MG tablet Take 0.5 tablets (0.5 mg total) by mouth 2 (two) times daily as needed for anxiety. 03/12/20   Aline August, MD  methocarbamol (ROBAXIN) 750 MG tablet Take 750 mg by mouth as needed. 06/03/20   [provider]  montelukast (SINGULAIR) 10 MG tablet Take 10 mg by mouth daily. 02/07/20   [provider]  Multiple Vitamin (MULTIVITAMIN WITH MINERALS) TABS Take 1 tablet by mouth daily.    [provider]  nystatin cream (MYCOSTATIN) Apply 1 application topically 2 (two) times daily. 06/24/20   Eileen Stanford,  PA-C  oxyCODONE-acetaminophen (PERCOCET) 5-325 MG tablet Take 1 tablet by mouth every 4 (four) hours as needed. 05/22/20   Diona Browner, DMD  pantoprazole (PROTONIX) 40 MG tablet Take 1 tablet (40 mg total) by mouth daily. 07/03/20   Eileen Stanford, PA-C  promethazine (PHENERGAN) 25 MG tablet Take 25 mg by mouth every 8 (eight) hours as needed for nausea/vomiting. 05/14/20   [provider]  tamsulosin (FLOMAX) 0.4 MG CAPS capsule Take 0.4 mg by mouth daily. 07/19/20   [provider]  venlafaxine XR (EFFEXOR-XR) 75 MG 24 hr capsule Take 75 mg by mouth in the morning and at bedtime. MUST HAVE BRAND NAME ONLY    [provider]    Physical Exam: BP 103/64   Pulse (!) 107   Temp 97.6 F (36.4 C) (Oral)   Resp 17   Ht 6' (1.829 m)   Wt 102.1 kg   SpO2 95%   BMI 30.52 kg/m   General: 70 y.o. year-old male well developed well nourished in no acute distress.  Alert and oriented x3. HEENT: NCAT, EOMI Neck: Supple, trachea medial Cardiovascular: Regular rate and rhythm with no rubs or gallops.  No thyromegaly or JVD noted.  2/4 pulses in all 4 extremities. Respiratory: Clear to auscultation with no wheezes or rales. Good inspiratory effort. Abdomen: Soft, nontender nondistended with normal bowel sounds x4 quadrants. Muskuloskeletal: Right knee brace.  Left leg with Coban dressing, area above the dressing was warm to touch and was with erythema.  Left big toe with wound.   Neuro: CN II-XII intact, strength 5/5 x 4, sensation, reflexes intact Skin: No ulcerative lesions noted or rashes Psychiatry: Mood is appropriate for condition and setting          Labs on Admission:  Basic Metabolic Panel: Recent Labs  Lab 12/29/20 1930  NA 133*  K 4.3  CL 102  CO2 24  GLUCOSE 94  BUN 23  CREATININE 1.38*  CALCIUM 8.4*   Liver Function Tests: Recent Labs  Lab 12/29/20 1930  AST 45*  ALT 22  ALKPHOS 110  BILITOT 0.8  PROT 5.9*  ALBUMIN 3.1*   No results  for input(s): LIPASE, AMYLASE in the last 168 hours. No results for input(s): AMMONIA in the last 168 hours. CBC: Recent Labs  Lab 12/29/20 1930  WBC 7.5  NEUTROABS 6.1  HGB 8.4*  HCT 27.2*  MCV 100.7*  PLT 247   Cardiac Enzymes: No results for input(s): CKTOTAL, CKMB, CKMBINDEX, TROPONINI in the last 168 hours.  BNP (last 3  results) Recent Labs    03/03/20 1600  BNP 2,454.0*    ProBNP (last 3 results) No results for input(s): PROBNP in the last 8760 hours.  CBG: No results for input(s): GLUCAP in the last 168 hours.  Radiological Exams on Admission: DG Chest Port 1 View  Result Date: 12/29/2020 CLINICAL DATA:  Concern for sepsis EXAM: PORTABLE CHEST 1 VIEW COMPARISON:  CT 08/06/2020, radiograph 06/26/2020 FINDINGS: There are increasing hazy opacities in the lungs. These partially obscure the costophrenic sulci though difficult to fully assess given collimation of the lung bases due to difficulty with patient positioning. Overall, appearance is increased from comparison prior and could reflect developing edema and or atelectasis with airspace disease less favored though not completely excluded. Pulmonary vascularity is slightly congested. Cardiomediastinal contours may be accentuated by the portable technique. Transcatheter aortic valve replacement. Aortic atherosclerosis. No acute osseous or soft tissue abnormality. Degenerative changes are present in the imaged spine and shoulders. IMPRESSION: Increasing hazy basilar opacities and pulmonary vascular congestion may reflect some developing edema and or atelectasis in the lung bases though underlying airspace disease is difficult to fully exclude particularly in the setting of possible sepsis. Aortic Atherosclerosis (ICD10-I70.0). Suboptimal imaging given difficulties with patient positioning. Electronically Signed   By: Lovena Le M.D.   On: 12/29/2020 19:59   DG Foot Complete Left  Result Date: 12/29/2020 CLINICAL DATA:  Left  leg pain and swelling, initial encounter EXAM: LEFT FOOT - COMPLETE 3+ VIEW COMPARISON:  04/19/2019, 11/18/2020 FINDINGS: Postsurgical changes are again noted in the medial malleolus stable in appearance from the prior exam. Tarsal degenerative changes are noted. Calcaneal spurring is seen. Flattening of the plantar arch is noted. Soft tissue ulcer is noted along the anterior aspect of the distal lower leg. No other ulcerations are seen. No acute fracture is noted. IMPRESSION: Chronic changes as described. Soft tissue wound is noted along the anterior aspect of the lower left leg. Electronically Signed   By: Inez Catalina M.D.   On: 12/29/2020 20:02    EKG: I independently viewed the EKG done and my findings are as followed: A. fib with rate control  Assessment/Plan Present on Admission:  Left leg cellulitis  Essential hypertension, benign  HLD (hyperlipidemia)  Chronic venous insufficiency  COPD (chronic obstructive pulmonary disease) (HCC)  Tobacco abuse  Principal Problem:   Left leg cellulitis Active Problems:   Essential hypertension, benign   HLD (hyperlipidemia)   Chronic venous insufficiency   Tobacco abuse   COPD (chronic obstructive pulmonary disease) (HCC)   Open wound of left foot   Macrocytic anemia   Arthritis   (HFpEF) heart failure with preserved ejection fraction (HCC)  Left leg cellulitis Patient was started on IV Unasyn; continue IV ceftriaxone Continue Tylenol as needed Blood culture pending  Left leg wound Continue wound care  Questionable pulmonary vascular congestion Chest x-ray suggestive of pulmonary edema/atelectasis BP is currently soft, so diuretics cannot be given at this time Patient was in no distress and has no shortness of breath Continue to monitor BP and consider re-evaluating x-ray if clinically indicated  HFpEF Echocardiogram done on 07/23/2020 showed LVEF 60 to 69% with LV diastolic parameters consistent with grade 2 diastolic  dysfunction Continue home meds when BP and liver enzymes improve  Macrocytic anemia Vitamin B12 and folate levels will be checked  Arthritis Continue Tylenol as needed  Chronic venous insufficiency Chronic stasis dermatitis Continue monitoring  COPD Continue Dulera, DuoNeb, Singulair  Hypertension BP meds will be held  at this time due to soft BP  Hyperlipidemia Lipitor will be temporarily held at this time due to mild elevated AST  S/P TAVR Continue aspirin, Plavix  GERD Continue Protonix  Tobacco abuse Patient consult on tobacco abuse cessation   DVT prophylaxis: Lovenox  Code Status: Full code  Family Communication: None at bedside  Disposition Plan:  Patient is from:                        home Anticipated DC to:                   SNF or family members home Anticipated DC date:               2-3 days Anticipated DC barriers:          Patient requires inpatient management due to left leg cellulitis requiring IV antibiotics   Consults called: None  Admission status: Inpatient    Bernadette Hoit MD [facility] 12/30/2020, 3:25 AM

## 2020-12-29 NOTE — ED Notes (Signed)
Patient had bowel movement. Lines were changed and patient was repositioned in bed.

## 2020-12-29 NOTE — ED Notes (Signed)
Patient was given a sandwich, diet coke and apple juice to eat and drink. Patient was repositioned in bed.

## 2020-12-29 NOTE — ED Notes (Signed)
Skin is red and tender near the rectum. Some areas of the skin appear to be broken and excoriated.

## 2020-12-29 NOTE — ED Notes (Addendum)
Patient blood pressure is low. Patient also appears to be in Afib. Patient states this occurs "every now and then". MD is aware. Patient was repositioned in bed and given more to eat.

## 2020-12-30 ENCOUNTER — Inpatient Hospital Stay (HOSPITAL_COMMUNITY): Payer: Medicare PPO

## 2020-12-30 ENCOUNTER — Encounter (HOSPITAL_COMMUNITY): Payer: Self-pay | Admitting: Internal Medicine

## 2020-12-30 DIAGNOSIS — M1990 Unspecified osteoarthritis, unspecified site: Secondary | ICD-10-CM

## 2020-12-30 DIAGNOSIS — D539 Nutritional anemia, unspecified: Secondary | ICD-10-CM

## 2020-12-30 DIAGNOSIS — L03116 Cellulitis of left lower limb: Secondary | ICD-10-CM | POA: Diagnosis not present

## 2020-12-30 DIAGNOSIS — I5030 Unspecified diastolic (congestive) heart failure: Secondary | ICD-10-CM

## 2020-12-30 DIAGNOSIS — S91302A Unspecified open wound, left foot, initial encounter: Secondary | ICD-10-CM

## 2020-12-30 DIAGNOSIS — I5032 Chronic diastolic (congestive) heart failure: Secondary | ICD-10-CM

## 2020-12-30 LAB — COMPREHENSIVE METABOLIC PANEL
ALT: 20 U/L (ref 0–44)
AST: 39 U/L (ref 15–41)
Albumin: 2.8 g/dL — ABNORMAL LOW (ref 3.5–5.0)
Alkaline Phosphatase: 95 U/L (ref 38–126)
Anion gap: 7 (ref 5–15)
BUN: 20 mg/dL (ref 8–23)
CO2: 25 mmol/L (ref 22–32)
Calcium: 8.2 mg/dL — ABNORMAL LOW (ref 8.9–10.3)
Chloride: 103 mmol/L (ref 98–111)
Creatinine, Ser: 1.13 mg/dL (ref 0.61–1.24)
GFR, Estimated: >60 mL/min (ref >60)
Glucose, Bld: 86 mg/dL (ref 70–99)
Potassium: 4.4 mmol/L (ref 3.5–5.1)
Sodium: 135 mmol/L (ref 135–145)
Total Bilirubin: 1 mg/dL (ref 0.3–1.2)
Total Protein: 5.4 g/dL — ABNORMAL LOW (ref 6.5–8.1)

## 2020-12-30 LAB — CBC
HCT: 24.2 % — ABNORMAL LOW (ref 39.0–52.0)
Hemoglobin: 7.4 g/dL — ABNORMAL LOW (ref 13.0–17.0)
MCH: 30.2 pg (ref 26.0–34.0)
MCHC: 30.6 g/dL (ref 30.0–36.0)
MCV: 98.8 fL (ref 80.0–100.0)
Platelets: 266 10*3/uL (ref 150–400)
RBC: 2.45 MIL/uL — ABNORMAL LOW (ref 4.22–5.81)
RDW: 18.4 % — ABNORMAL HIGH (ref 11.5–15.5)
WBC: 6.6 10*3/uL (ref 4.0–10.5)
nRBC: 0 % (ref 0.0–0.2)

## 2020-12-30 LAB — HEMOGLOBIN AND HEMATOCRIT, BLOOD
HCT: 24.1 % — ABNORMAL LOW (ref 39.0–52.0)
Hemoglobin: 7.3 g/dL — ABNORMAL LOW (ref 13.0–17.0)

## 2020-12-30 LAB — PHOSPHORUS: Phosphorus: 3.6 mg/dL (ref 2.5–4.6)

## 2020-12-30 LAB — BRAIN NATRIURETIC PEPTIDE: B Natriuretic Peptide: 805 pg/mL — ABNORMAL HIGH (ref 0.0–100.0)

## 2020-12-30 LAB — APTT: aPTT: 45 seconds — ABNORMAL HIGH (ref 24–36)

## 2020-12-30 LAB — PROTIME-INR
INR: 1.1 (ref 0.8–1.2)
Prothrombin Time: 14.7 seconds (ref 11.4–15.2)

## 2020-12-30 LAB — MAGNESIUM: Magnesium: 2 mg/dL (ref 1.7–2.4)

## 2020-12-30 LAB — VITAMIN B12: Vitamin B-12: >7500 pg/mL — ABNORMAL HIGH (ref 180–914)

## 2020-12-30 LAB — FOLATE: Folate: 6.3 ng/mL (ref >5.9)

## 2020-12-30 MED ORDER — MONTELUKAST SODIUM 10 MG PO TABS: 10.0000 mg | ORAL_TABLET | Freq: Every day | ORAL | Status: DC

## 2020-12-30 MED ORDER — OXYCODONE-ACETAMINOPHEN 5-325 MG PO TABS
1.0000 | ORAL_TABLET | ORAL | Status: DC | PRN
  Administered 2020-12-31 – 2021-01-05 (×6): 1 via ORAL
  Filled 2020-12-30 (×6): qty 1

## 2020-12-30 MED ORDER — ATENOLOL 25 MG PO TABS
25.0000 mg | ORAL_TABLET | Freq: Every day | ORAL | Status: DC
  Administered 2020-12-31 – 2021-01-01 (×2): 25 mg via ORAL
  Filled 2020-12-30 (×2): qty 1

## 2020-12-30 MED ORDER — COLLAGENASE 250 UNIT/GM EX OINT
TOPICAL_OINTMENT | Freq: Every day | CUTANEOUS | Status: DC
  Filled 2020-12-30 (×3): qty 30

## 2020-12-30 MED ORDER — LACTATED RINGERS IV SOLN: Freq: Once | INTRAVENOUS | Status: AC

## 2020-12-30 MED ORDER — ADULT MULTIVITAMIN W/MINERALS CH: 1.0000 | ORAL_TABLET | Freq: Every day | ORAL | Status: DC

## 2020-12-30 MED ORDER — HYDRALAZINE HCL 50 MG PO TABS
75.0000 mg | ORAL_TABLET | Freq: Three times a day (TID) | ORAL | Status: DC
  Administered 2020-12-30 – 2021-01-01 (×4): 75 mg via ORAL
  Filled 2020-12-30 (×8): qty 1

## 2020-12-30 MED ORDER — MOMETASONE FURO-FORMOTEROL FUM 200-5 MCG/ACT IN AERO: 2.0000 | INHALATION_SPRAY | Freq: Two times a day (BID) | RESPIRATORY_TRACT | Status: DC

## 2020-12-30 MED ORDER — SODIUM CHLORIDE 0.9 % IV SOLN
2.0000 g | INTRAVENOUS | Status: DC
  Administered 2020-12-30 – 2021-01-04 (×7): 2 g via INTRAVENOUS
  Filled 2020-12-30 (×3): qty 2
  Filled 2020-12-30 (×3): qty 20
  Filled 2020-12-30: qty 2
  Filled 2020-12-30: qty 20

## 2020-12-30 MED ORDER — IPRATROPIUM-ALBUTEROL 0.5-2.5 (3) MG/3ML IN SOLN
3.0000 mL | Freq: Four times a day (QID) | RESPIRATORY_TRACT | Status: DC | PRN
  Administered 2020-12-31 (×2): 3 mL via RESPIRATORY_TRACT
  Filled 2020-12-30 (×2): qty 3

## 2020-12-30 MED ORDER — ACETAMINOPHEN 325 MG PO TABS
650.0000 mg | ORAL_TABLET | Freq: Four times a day (QID) | ORAL | Status: DC | PRN
  Administered 2020-12-30: 650 mg via ORAL
  Filled 2020-12-30: qty 2

## 2020-12-30 MED ORDER — VENLAFAXINE HCL ER 75 MG PO CP24
75.0000 mg | ORAL_CAPSULE | Freq: Every day | ORAL | Status: DC
  Administered 2020-12-31 – 2021-01-05 (×6): 75 mg via ORAL
  Filled 2020-12-30 (×6): qty 1

## 2020-12-30 MED ORDER — LORAZEPAM 0.5 MG PO TABS
0.5000 mg | ORAL_TABLET | Freq: Two times a day (BID) | ORAL | Status: DC | PRN
  Administered 2020-12-30 – 2020-12-31 (×3): 0.5 mg via ORAL
  Filled 2020-12-30 (×3): qty 1

## 2020-12-30 MED ORDER — FOLIC ACID 1 MG PO TABS
1.0000 mg | ORAL_TABLET | Freq: Every day | ORAL | Status: DC
  Administered 2020-12-30 – 2021-01-05 (×7): 1 mg via ORAL
  Filled 2020-12-30 (×8): qty 1

## 2020-12-30 MED ORDER — TAMSULOSIN HCL 0.4 MG PO CAPS
0.4000 mg | ORAL_CAPSULE | Freq: Every day | ORAL | Status: DC
  Administered 2021-01-04: 0.4 mg via ORAL
  Filled 2020-12-30 (×5): qty 1

## 2020-12-30 MED ORDER — ASPIRIN 81 MG PO CHEW
81.0000 mg | CHEWABLE_TABLET | Freq: Every day | ORAL | Status: DC
  Administered 2020-12-30 – 2021-01-05 (×7): 81 mg via ORAL
  Filled 2020-12-30 (×7): qty 1

## 2020-12-30 MED ORDER — PROMETHAZINE HCL 25 MG PO TABS: 25.0000 mg | ORAL_TABLET | Freq: Three times a day (TID) | ORAL | Status: DC | PRN

## 2020-12-30 MED ORDER — CLOPIDOGREL BISULFATE 75 MG PO TABS
75.0000 mg | ORAL_TABLET | Freq: Every day | ORAL | Status: DC
  Administered 2021-01-01: 75 mg via ORAL
  Filled 2020-12-30 (×3): qty 1

## 2020-12-30 MED ORDER — METHOCARBAMOL 500 MG PO TABS: 750.0000 mg | ORAL_TABLET | ORAL | Status: DC | PRN

## 2020-12-30 MED ORDER — PANTOPRAZOLE SODIUM 40 MG PO TBEC
40.0000 mg | DELAYED_RELEASE_TABLET | Freq: Every day | ORAL | Status: DC
  Administered 2020-12-30 – 2021-01-05 (×7): 40 mg via ORAL
  Filled 2020-12-30 (×7): qty 1

## 2020-12-30 NOTE — ED Notes (Signed)
Blood was recollected using a butterfly needle.

## 2020-12-30 NOTE — ED Notes (Signed)
Patient is alseep in room. Readjusted blood pressure cuff.

## 2020-12-30 NOTE — Consult Note (Signed)
[facility] Nurse wound consult note Consultation was completed by review of records, images and assistance from the bedside nurse/clinical staff.  Reason for Consult: left foot wound and Unna boot Patient followed by Dr. Sharol Given as outpatient  Wound type: trauma from Unna's boot at the bed of the anterior left foot (MDRPI) Medical device related pressure injury Pressure Injury POA: Yes Measurement: see nursing flow sheets; to be completed at the time of the 2 RN skin assessment  Wound bed: Proximal 100% yellow Distal 100% pink Drainage (amount, consistency, odor) unable to assess; no dressing in place in images  Periwound:edema, hyperkeratotic skin changes consistent with venous stasis and/or lymphedema  Dressing procedure/placement/frequency:  Enzymatic debridement to the unstageable pressure injury daily Foam dressing to the clean, full thickness ulceration Unna's boot changes 2x week Wednesday and Saturdays until patient DC and then return to home care or outpatient care per Dr. Sharol Given  Re consult if needed, will not follow at this time. Thanks  Carynn Felling [employer], RN,CWOCN, CNS, [place] [phone])

## 2020-12-30 NOTE — ED Notes (Signed)
BP's are still soft. MD is aware. Fluids ordered.

## 2020-12-30 NOTE — Progress Notes (Signed)
PROGRESS NOTE    Jordan [name]  ZOX:[id number] DOB: May 09, 1951 DOA: 12/29/2020 PCP: [name], MD     Brief Narrative:  Jordan [name] is a 70 y.o. 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  Patient follows with Dr. Sharol Given (orthopedics) due to worsening right knee arthritis, he had a fall over the weekend and he was seen in the ED on July 10 and was noted to have a right knee contusion with open wound left leg wound and was discharged home to continue with home medications.  He went to his PCPs office 7/12 and EMS was activated for patient to be taken to the ED. Left foot x-ray showed chronic changes as described. Soft tissue wound is noted along the anterior aspect of the lower left leg. Patient was treated with Unasyn due to presumed cellulitis.   New events last 24 hours / Subjective: No complaints of chest pain, shortness of breath, cough, nausea or vomiting.  Has had cellulitic changes to the left foot in the last week.  Continues to have some right knee pain which is chronic in nature.  Assessment & Plan:   Principal Problem:   Left leg cellulitis Active Problems:   Essential hypertension, benign   HLD (hyperlipidemia)   Chronic venous insufficiency   Tobacco abuse   COPD (chronic obstructive pulmonary disease) (HCC)   Open wound of left foot   Macrocytic anemia   Arthritis   (HFpEF) heart failure with preserved ejection fraction (HCC)   Left leg cellulitis -Sepsis ruled out, does not meet criteria  -Left foot xray: Soft tissue wound is noted along the anterior aspect of the lower left  leg. -Unna boot in place which was placed at his PCPs office -Continue Rocephin -Wound care RN consulted  Chronic diastolic heart failure -BNP 805  -CXR: Increasing hazy basilar opacities and pulmonary vascular congestion may reflect some developing edema and or atelectasis in the lung bases   Elevated B12 level -Patient reports that he received vitamin B12 supplementation 7/12 at his PCPs office.  Has had vitamin B12 deficiency for 4 to 5 years requiring monthly injections  AS status post TAVR -Continue plavix   HTN -Continue tenormin, hydralazine  Elevated liver enzyme -RUQ [place]: Gallbladder is moderately distended with mild diffuse gallbladder wall thickening measuring up to 6 mm. This is of uncertain etiology and significance, but can be seen in the setting of intrinsic liver disease. Given the lack of a sonographic Murphy's sign, cholecystitis is not favored. -Trend LFTs, now normal. INR normal  -If repeat B12 level is not to normal level, will need to further investigate underlying liver disease      DVT prophylaxis:  enoxaparin (LOVENOX) injection 40 mg Start: 12/29/20 2345 SCDs Start: 12/29/20 2304  Code Status:     Code Status Orders  (From admission, onward)           Start     Ordered   12/29/20 2304  Full code  Continuous        12/29/20 2304           Code Status History     Date Active Date Inactive Code Status Order ID  Comments User Context   06/30/2020 1557 07/03/2020 1649 Full Code [id number]  Crista Luria Inpatient   03/03/2020 1907 03/12/2020 2203 Full Code [id number]  Neena Rhymes, MD ED   03/14/2017 0846 03/20/2017 2308 Full Code [id number]  Reyne Dumas, MD ED   10/30/2014 2043 11/04/2014 1920 Full Code [id number]  Allyne Gee, MD Inpatient   09/13/2012 1159 09/17/2012 1412 Full Code [id number]  Sheela Stack, MD Inpatient      Family Communication: None at bedside Disposition Plan:  Status is: Inpatient  Remains  inpatient appropriate because:IV treatments appropriate due to intensity of illness or inability to take PO  Dispo: The patient is from: Home              Anticipated d/c is to: Home              Patient currently is not medically stable to d/c.   Difficult to place patient No     Antimicrobials:  Anti-infectives (From admission, onward)    Start     Dose/Rate Route Frequency Ordered Stop   12/30/20 0330  cefTRIAXone (ROCEPHIN) 2 g in sodium chloride 0.9 % 100 mL IVPB        2 g 200 mL/hr over 30 Minutes Intravenous Every 24 hours 12/30/20 0328     12/29/20 2230  Ampicillin-Sulbactam (UNASYN) 3 g in sodium chloride 0.9 % 100 mL IVPB        3 g 200 mL/hr over 30 Minutes Intravenous  Once 12/29/20 2223 12/29/20 2326        Objective: Vitals:   12/30/20 0935 12/30/20 1000 12/30/20 1200 12/30/20 1241  BP: (!) 108/93 135/60 (!) 117/56 (!) 117/56  Pulse: (!) 110   96  Resp: 15   15  Temp:      TempSrc:      SpO2: 100%   94%  Weight:      Height:        Intake/Output Summary (Last 24 hours) at 12/30/2020 1327 Last data filed at 12/30/2020 0530 Gross per 24 hour  Intake 2199.36 ml  Output 1200 ml  Net 999.36 ml   Filed Weights   12/29/20 2021  Weight: 102.1 kg    Examination:  General exam: Appears calm and comfortable  Respiratory system: Clear to auscultation. Respiratory effort normal. No respiratory distress. No conversational dyspnea. On room air  Cardiovascular system: S1 & S2 heard, Irreg rhythm. No murmurs. Gastrointestinal system: Abdomen is nondistended, soft and nontender. Normal bowel sounds heard. Central nervous system: Alert and oriented. No focal neurological deficits. Speech clear.  Skin: LLE wrapped in [place]  Psychiatry: Judgement and insight appear normal. Mood & affect appropriate.   Data Reviewed: I have personally reviewed following labs and imaging studies  CBC: Recent Labs  Lab 12/29/20 1930 12/30/20 0412 12/30/20 0534  WBC 7.5 6.6   --   NEUTROABS 6.1  --   --   HGB 8.4* 7.4* 7.3*  HCT 27.2* 24.2* 24.1*  MCV 100.7* 98.8  --   PLT 247 266  --    Basic Metabolic Panel: Recent Labs  Lab 12/29/20 1930 12/30/20 0412  NA 133* 135  K 4.3 4.4  CL 102 103  CO2 24 25  GLUCOSE 94 86  BUN 23 20  CREATININE 1.38* 1.13  CALCIUM 8.4* 8.2*  MG  --  2.0  PHOS  --  3.6   GFR: Estimated Creatinine Clearance: 75.2 mL/min (by C-G formula based on SCr of  1.13 mg/dL). Liver Function Tests: Recent Labs  Lab 12/29/20 1930 12/30/20 0412  AST 45* 39  ALT 22 20  ALKPHOS 110 95  BILITOT 0.8 1.0  PROT 5.9* 5.4*  ALBUMIN 3.1* 2.8*   No results for input(s): LIPASE, AMYLASE in the last 168 hours. No results for input(s): AMMONIA in the last 168 hours. Coagulation Profile: Recent Labs  Lab 12/29/20 1930 12/30/20 0412  INR 1.0 1.1   Cardiac Enzymes: No results for input(s): CKTOTAL, CKMB, CKMBINDEX, TROPONINI in the last 168 hours. BNP (last 3 results) No results for input(s): PROBNP in the last 8760 hours. HbA1C: No results for input(s): HGBA1C in the last 72 hours. CBG: No results for input(s): GLUCAP in the last 168 hours. Lipid Profile: No results for input(s): CHOL, HDL, LDLCALC, TRIG, CHOLHDL, LDLDIRECT in the last 72 hours. Thyroid Function Tests: No results for input(s): TSH, T4TOTAL, FREET4, T3FREE, THYROIDAB in the last 72 hours. Anemia Panel: Recent Labs    12/30/20 0412  VITAMINB12 >7,500*  FOLATE 6.3   Sepsis Labs: Recent Labs  Lab 12/29/20 1829  LATICACIDVEN 1.3    Recent Results (from the past 240 hour(s))  Blood Culture (routine x 2)     Status: None (Preliminary result)   Collection Time: 12/29/20  6:35 PM   Specimen: BLOOD  Result Value Ref Range Status   Specimen Description   Final    BLOOD RIGHT ANTECUBITAL Performed at [facility] [address] [place], [place] [address]    Special Requests   Final    BOTTLES DRAWN AEROBIC AND ANAEROBIC Blood Culture  adequate volume Performed at [facility] [address] [place], [place] [address]    Culture   Final    NO GROWTH < 12 HOURS Performed at [facility] [address], [place], [place] [address]    Report Status PENDING  Incomplete  Blood Culture (routine x 2)     Status: None (Preliminary result)   Collection Time: 12/29/20  6:41 PM   Specimen: BLOOD  Result Value Ref Range Status   Specimen Description   Final    BLOOD LEFT ANTECUBITAL Performed at [facility] [address], [place], [place] [address]    Special Requests   Final    BOTTLES DRAWN AEROBIC AND ANAEROBIC Blood Culture adequate volume Performed at [facility] [address], [place], [place] [address]    Culture   Final    NO GROWTH < 12 HOURS Performed at [facility] [address] [place],  [address]    Report Status PENDING  Incomplete  Resp Panel by RT-PCR (Flu A&B, Covid) Nasopharyngeal Swab     Status: None   Collection Time: 12/29/20 10:50 PM   Specimen: Nasopharyngeal Swab; Nasopharyngeal(NP) swabs in vial transport medium  Result Value Ref Range Status   SARS Coronavirus 2 by RT PCR NEGATIVE NEGATIVE Final    Comment: (NOTE) SARS-CoV-2 target nucleic acids are NOT DETECTED.  The SARS-CoV-2 RNA is generally detectable in upper respiratory specimens during the acute phase of infection. The lowest concentration of SARS-CoV-2 viral copies this assay can detect is 138 copies/mL. A negative result does not preclude SARS-Cov-2 infection and should not be used as the sole basis for treatment or other patient management decisions. A negative result may occur with  improper specimen collection/handling, submission of specimen other than nasopharyngeal swab, presence of viral mutation(s) within the areas targeted by this assay,  and inadequate number of viral copies(<138 copies/mL). A negative result must be combined  with clinical observations, patient history, and epidemiological information. The expected result is Negative.  Fact Sheet for Patients:  EntrepreneurPulse.com.au  Fact Sheet for Healthcare Providers:  IncredibleEmployment.be  This test is no t yet approved or cleared by the [place] FDA and  has been authorized for detection and/or diagnosis of SARS-CoV-2 by FDA under an Emergency Use Authorization (EUA). This EUA will remain  in effect (meaning this test can be used) for the duration of the COVID-19 declaration under Section 564(b)(1) of the Act, 21 U.S.C.section 360bbb-3(b)(1), unless the authorization is terminated  or revoked sooner.       Influenza A by PCR NEGATIVE NEGATIVE Final   Influenza B by PCR NEGATIVE NEGATIVE Final    Comment: (NOTE) The Xpert Xpress SARS-CoV-2/FLU/RSV plus assay is intended as an aid in the diagnosis of influenza from Nasopharyngeal swab specimens and should not be used as a sole basis for treatment. Nasal washings and aspirates are unacceptable for Xpert Xpress SARS-CoV-2/FLU/RSV testing.  Fact Sheet for Patients: EntrepreneurPulse.com.au  Fact Sheet for Healthcare Providers: IncredibleEmployment.be  This test is not yet approved or cleared by the [place] FDA and has been authorized for detection and/or diagnosis of SARS-CoV-2 by FDA under an Emergency Use Authorization (EUA). This EUA will remain in effect (meaning this test can be used) for the duration of the COVID-19 declaration under Section 564(b)(1) of the Act, 21 U.S.C. section 360bbb-3(b)(1), unless the authorization is terminated or revoked.  Performed at [facility], [place] [address], [place], [place] [address]       Radiology Studies: [facility] Chest Port 1 View  Result Date: 12/29/2020 CLINICAL DATA:  Concern for sepsis EXAM: PORTABLE CHEST 1 VIEW COMPARISON:  CT 08/06/2020,  radiograph 06/26/2020 FINDINGS: There are increasing hazy opacities in the lungs. These partially obscure the costophrenic sulci though difficult to fully assess given collimation of the lung bases due to difficulty with patient positioning. Overall, appearance is increased from comparison prior and could reflect developing edema and or atelectasis with airspace disease less favored though not completely excluded. Pulmonary vascularity is slightly congested. Cardiomediastinal contours may be accentuated by the portable technique. Transcatheter aortic valve replacement. Aortic atherosclerosis. No acute osseous or soft tissue abnormality. Degenerative changes are present in the imaged spine and shoulders. IMPRESSION: Increasing hazy basilar opacities and pulmonary vascular congestion may reflect some developing edema and or atelectasis in the lung bases though underlying airspace disease is difficult to fully exclude particularly in the setting of possible sepsis. Aortic Atherosclerosis (ICD10-I70.0). Suboptimal imaging given difficulties with patient positioning. Electronically Signed   By: Lovena Le M.D.   On: 12/29/2020 19:59   DG Foot Complete Left  Result Date: 12/29/2020 CLINICAL DATA:  Left leg pain and swelling, initial encounter EXAM: LEFT FOOT - COMPLETE 3+ VIEW COMPARISON:  04/19/2019, 11/18/2020 FINDINGS: Postsurgical changes are again noted in the medial malleolus stable in appearance from the prior exam. Tarsal degenerative changes are noted. Calcaneal spurring is seen. Flattening of the plantar arch is noted. Soft tissue ulcer is noted along the anterior aspect of the distal lower leg. No other ulcerations are seen. No acute fracture is noted. IMPRESSION: Chronic changes as described. Soft tissue wound is noted along the anterior aspect of the lower left leg. Electronically Signed   By: Inez Catalina M.D.   On: 12/29/2020 20:02   US Abdomen Limited RUQ (LIVER/GB)  Result Date:  12/30/2020  CLINICAL DATA:  70 year old male with history of elevated liver function tests. EXAM: ULTRASOUND ABDOMEN LIMITED RIGHT UPPER QUADRANT COMPARISON:  No priors. FINDINGS: Gallbladder: Gallbladder is moderately distended. No gallstones. Gallbladder wall appears mildly thickened measuring up to 6 mm in diameter. No pericholecystic fluid. Per report from the sonographer there was no sonographic Murphy's sign on examination. No sonographic Murphy sign noted by sonographer. Common bile duct: Diameter: 4 mm in the porta hepatis. Liver: No focal lesion identified. Within normal limits in parenchymal echogenicity. Portal vein is patent on color Doppler imaging with normal direction of blood flow towards the liver. Other: Right pleural effusion. IMPRESSION: 1. No gallstones. 2. Gallbladder is moderately distended with mild diffuse gallbladder wall thickening measuring up to 6 mm. This is of uncertain etiology and significance, but can be seen in the setting of intrinsic liver disease. Given the lack of a sonographic Murphy's sign, cholecystitis is not favored. 3. No intra or extrahepatic biliary ductal dilatation to suggest biliary tract obstruction. 4. Right pleural effusion. Electronically Signed   By: Vinnie Langton M.D.   On: 12/30/2020 09:26      Scheduled Meds:  aspirin  81 mg Oral Daily   atenolol  25 mg Oral Daily   clopidogrel  75 mg Oral Q breakfast   enoxaparin (LOVENOX) injection  40 mg Subcutaneous I37C   folic acid  1 mg Oral Daily   hydrALAZINE  75 mg Oral TID   pantoprazole  40 mg Oral Daily   [START ON 12/31/2020] tamsulosin  0.4 mg Oral Daily   [START ON 12/31/2020] venlafaxine XR  75 mg Oral Q breakfast   Continuous Infusions:  cefTRIAXone (ROCEPHIN)  IV Stopped (12/30/20 0530)     LOS: 1 day      Time spent: 25 minutes   Dessa Phi, DO [facility] 12/30/2020, 1:27 PM   Available via Epic secure chat 7am-7pm After these hours, please refer to coverage  provider listed on [email or web address]

## 2020-12-30 NOTE — ED Notes (Signed)
Dr. Maylene Roes is aware of the 1 point drop in pt's Hgb since arrival.

## 2020-12-31 ENCOUNTER — Other Ambulatory Visit: Payer: Self-pay

## 2020-12-31 DIAGNOSIS — L03116 Cellulitis of left lower limb: Secondary | ICD-10-CM | POA: Diagnosis not present

## 2020-12-31 LAB — COMPREHENSIVE METABOLIC PANEL
ALT: 17 U/L (ref 0–44)
AST: 31 U/L (ref 15–41)
Albumin: 2.6 g/dL — ABNORMAL LOW (ref 3.5–5.0)
Alkaline Phosphatase: 88 U/L (ref 38–126)
Anion gap: 5 (ref 5–15)
BUN: 22 mg/dL (ref 8–23)
CO2: 28 mmol/L (ref 22–32)
Calcium: 8 mg/dL — ABNORMAL LOW (ref 8.9–10.3)
Chloride: 105 mmol/L (ref 98–111)
Creatinine, Ser: 1.27 mg/dL — ABNORMAL HIGH (ref 0.61–1.24)
GFR, Estimated: >60 mL/min (ref >60)
Glucose, Bld: 84 mg/dL (ref 70–99)
Potassium: 4.5 mmol/L (ref 3.5–5.1)
Sodium: 138 mmol/L (ref 135–145)
Total Bilirubin: 0.6 mg/dL (ref 0.3–1.2)
Total Protein: 5.2 g/dL — ABNORMAL LOW (ref 6.5–8.1)

## 2020-12-31 LAB — CBC
HCT: 24.1 % — ABNORMAL LOW (ref 39.0–52.0)
Hemoglobin: 7.4 g/dL — ABNORMAL LOW (ref 13.0–17.0)
MCH: 31 pg (ref 26.0–34.0)
MCHC: 30.7 g/dL (ref 30.0–36.0)
MCV: 100.8 fL — ABNORMAL HIGH (ref 80.0–100.0)
Platelets: 229 10*3/uL (ref 150–400)
RBC: 2.39 MIL/uL — ABNORMAL LOW (ref 4.22–5.81)
RDW: 18.5 % — ABNORMAL HIGH (ref 11.5–15.5)
WBC: 6.9 10*3/uL (ref 4.0–10.5)
nRBC: 0 % (ref 0.0–0.2)

## 2020-12-31 LAB — URINE CULTURE

## 2020-12-31 MED ORDER — IPRATROPIUM-ALBUTEROL 0.5-2.5 (3) MG/3ML IN SOLN
3.0000 mL | Freq: Three times a day (TID) | RESPIRATORY_TRACT | Status: DC
  Administered 2020-12-31 – 2021-01-01 (×2): 3 mL via RESPIRATORY_TRACT
  Filled 2020-12-31 (×2): qty 3

## 2020-12-31 NOTE — Progress Notes (Signed)
PROGRESS NOTE    Jordan [name]  YCX:[id number] DOB: 05-Nov-1950 DOA: 12/29/2020 PCP: [name], MD     Brief Narrative:  Jordan [name] is a 70 y.o. 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  Patient follows with Dr. Sharol Given (orthopedics) due to worsening right knee arthritis, he had a fall over the weekend and he was seen in the ED on July 10 and was noted to have a right knee contusion with open wound left leg wound and was discharged home to continue with home medications.  He went to his PCPs office 7/12 and EMS was activated for patient to be taken to the ED. Left foot x-ray showed chronic changes as described. Soft tissue wound is noted along the anterior aspect of the lower left leg. Patient was treated with Unasyn due to presumed cellulitis.   New events last 24 hours / Subjective: No new complaints this morning.  States that his leg is "coming along."  States that he is feeling better after getting his Ativan this morning.  Assessment & Plan:   Principal Problem:   Left leg cellulitis Active Problems:   Essential hypertension, benign   HLD (hyperlipidemia)   Chronic venous insufficiency   Tobacco abuse   COPD (chronic obstructive pulmonary disease) (HCC)   Open wound of left foot   Macrocytic anemia   Arthritis   (HFpEF) heart failure with preserved ejection fraction (HCC)   Left leg cellulitis -Sepsis ruled out, does not meet criteria  -Left foot xray: Soft tissue wound is noted along the anterior aspect of the lower left leg. -Continue Rocephin -Wound care RN consulted  Chronic  diastolic heart failure -BNP 805  -CXR: Increasing hazy basilar opacities and pulmonary vascular congestion may reflect some developing edema and or atelectasis in the lung bases   Elevated B12 level -Patient reports that he received vitamin B12 supplementation 7/12 at his PCPs office.  Has had vitamin B12 deficiency for 4 to 5 years requiring monthly injections  AS status post TAVR -Continue aspirin, plavix   HTN -Continue tenormin, hydralazine  Elevated liver enzyme -RUQ [place]: Gallbladder is moderately distended with mild diffuse gallbladder wall thickening measuring up to 6 mm. This is of uncertain etiology and significance, but can be seen in the setting of intrinsic liver disease. Given the lack of a sonographic Murphy's sign, cholecystitis is not favored. -Trend LFTs, now normal. INR normal  -If repeat B12 level is not to normal level, will need to further investigate underlying liver disease      DVT prophylaxis:  enoxaparin (LOVENOX) injection 40 mg Start: 12/29/20 2345 SCDs Start: 12/29/20 2304  Code Status:     Code Status Orders  (From admission, onward)           Start     Ordered   12/29/20 2304  Full code  Continuous        12/29/20 2304           Code Status History     Date Active Date Inactive Code Status Order ID Comments User Context   06/30/2020 1557 07/03/2020 1649 Full Code [id number]  Crista Luria Inpatient   03/03/2020 1907  03/12/2020 2203 Full Code [id number]  Neena Rhymes, MD ED   03/14/2017 0846 03/20/2017 2308 Full Code [id number]  Reyne Dumas, MD ED   10/30/2014 2043 11/04/2014 1920 Full Code [id number]  Allyne Gee, MD Inpatient   09/13/2012 1159 09/17/2012 1412 Full Code [id number]  Sheela Stack, MD Inpatient      Family Communication: None at bedside Disposition Plan:  Status is: Inpatient  Remains inpatient appropriate because:IV treatments appropriate due to intensity of illness or inability to take PO  Dispo:  The patient is from: Home              Anticipated d/c is to: Home              Patient currently is not medically stable to d/c.  Remains on IV Rocephin.   Difficult to place patient No     Antimicrobials:  Anti-infectives (From admission, onward)    Start     Dose/Rate Route Frequency Ordered Stop   12/30/20 0330  cefTRIAXone (ROCEPHIN) 2 g in sodium chloride 0.9 % 100 mL IVPB        2 g 200 mL/hr over 30 Minutes Intravenous Every 24 hours 12/30/20 0328     12/29/20 2230  Ampicillin-Sulbactam (UNASYN) 3 g in sodium chloride 0.9 % 100 mL IVPB        3 g 200 mL/hr over 30 Minutes Intravenous  Once 12/29/20 2223 12/29/20 2326        Objective: Vitals:   12/30/20 2058 12/30/20 2357 12/31/20 0425 12/31/20 0904  BP: 130/65 128/74 (!) 117/57   Pulse: 94 100 96   Resp: 18 18 18    Temp: 98.1 F (36.7 C) 98.1 F (36.7 C) 98.8 F (37.1 C)   TempSrc: Oral Oral Oral   SpO2: 100% 100% 97% 98%  Weight:      Height:        Intake/Output Summary (Last 24 hours) at 12/31/2020 1206 Last data filed at 12/31/2020 0450 Gross per 24 hour  Intake 1355 ml  Output 1375 ml  Net -20 ml    Filed Weights   12/29/20 2021  Weight: 102.1 kg   Examination: General exam: Appears calm and comfortable  Respiratory system: Clear to auscultation. Respiratory effort normal. Cardiovascular system: S1 & S2 heard.  Gastrointestinal system: Abdomen is nondistended, soft and nontender. Normal bowel sounds heard. Central nervous system: Alert and oriented. Non focal exam. Speech clear  Extremities: Symmetric in appearance bilaterally  Skin: Erythema extending up just prior to his knee on the left Psychiatry: Judgement and insight appear stable. Mood & affect appropriate.    Data Reviewed: I have personally reviewed following labs and imaging studies  CBC: Recent Labs  Lab 12/29/20 1930 12/30/20 0412 12/30/20 0534 12/31/20 0444  WBC 7.5 6.6  --  6.9  NEUTROABS 6.1  --   --   --   HGB 8.4*  7.4* 7.3* 7.4*  HCT 27.2* 24.2* 24.1* 24.1*  MCV 100.7* 98.8  --  100.8*  PLT 247 266  --  423    Basic Metabolic Panel: Recent Labs  Lab 12/29/20 1930 12/30/20 0412 12/31/20 0444  NA 133* 135 138  K 4.3 4.4 4.5  CL 102 103 105  CO2 24 25 28   GLUCOSE 94 86 84  BUN 23 20 22   CREATININE 1.38* 1.13 1.27*  CALCIUM 8.4* 8.2* 8.0*  MG  --  2.0  --   PHOS  --  3.6  --  GFR: Estimated Creatinine Clearance: 66.9 mL/min (A) (by C-G formula based on SCr of 1.27 mg/dL (H)). Liver Function Tests: Recent Labs  Lab 12/29/20 1930 12/30/20 0412 12/31/20 0444  AST 45* 39 31  ALT 22 20 17   ALKPHOS 110 95 88  BILITOT 0.8 1.0 0.6  PROT 5.9* 5.4* 5.2*  ALBUMIN 3.1* 2.8* 2.6*    No results for input(s): LIPASE, AMYLASE in the last 168 hours. No results for input(s): AMMONIA in the last 168 hours. Coagulation Profile: Recent Labs  Lab 12/29/20 1930 12/30/20 0412  INR 1.0 1.1    Cardiac Enzymes: No results for input(s): CKTOTAL, CKMB, CKMBINDEX, TROPONINI in the last 168 hours. BNP (last 3 results) No results for input(s): PROBNP in the last 8760 hours. HbA1C: No results for input(s): HGBA1C in the last 72 hours. CBG: No results for input(s): GLUCAP in the last 168 hours. Lipid Profile: No results for input(s): CHOL, HDL, LDLCALC, TRIG, CHOLHDL, LDLDIRECT in the last 72 hours. Thyroid Function Tests: No results for input(s): TSH, T4TOTAL, FREET4, T3FREE, THYROIDAB in the last 72 hours. Anemia Panel: Recent Labs    12/30/20 0412  VITAMINB12 >7,500*  FOLATE 6.3    Sepsis Labs: Recent Labs  Lab 12/29/20 1829  LATICACIDVEN 1.3     Recent Results (from the past 240 hour(s))  Blood Culture (routine x 2)     Status: None (Preliminary result)   Collection Time: 12/29/20  6:35 PM   Specimen: BLOOD  Result Value Ref Range Status   Specimen Description   Final    BLOOD RIGHT ANTECUBITAL Performed at [facility] [address] [place],  [place] [address]    Special Requests   Final    BOTTLES DRAWN AEROBIC AND ANAEROBIC Blood Culture adequate volume Performed at [facility] [address], [place], [place] [address]    Culture   Final    NO GROWTH 2 DAYS Performed at [facility] [address] [place], [place] [address]    Report Status PENDING  Incomplete  Blood Culture (routine x 2)     Status: None (Preliminary result)   Collection Time: 12/29/20  6:41 PM   Specimen: BLOOD  Result Value Ref Range Status   Specimen Description   Final    BLOOD LEFT ANTECUBITAL Performed at [facility] [address], [place], [place] [address]    Special Requests   Final    BOTTLES DRAWN AEROBIC AND ANAEROBIC Blood Culture adequate volume Performed at [facility] [address], [place], [place] [address]    Culture   Final    NO GROWTH 2 DAYS Performed at [facility] [address], [place], [place] [address]    Report Status PENDING  Incomplete  Urine culture     Status: Abnormal   Collection Time: 12/29/20  8:22 PM   Specimen: In/Out Cath Urine  Result Value Ref Range Status   Specimen Description   Final    IN/OUT CATH URINE Performed at [facility] [address] [place],  [address]    Special Requests   Final    NONE Performed at [facility], [place] [address], [place],  [address]    Culture MULTIPLE SPECIES PRESENT, SUGGEST RECOLLECTION (A)  Final   Report Status 12/31/2020 FINAL  Final  Resp Panel by RT-PCR (Flu A&B, Covid) Nasopharyngeal Swab     Status: None   Collection Time: 12/29/20  10:50 PM   Specimen: Nasopharyngeal Swab; Nasopharyngeal(NP) swabs in vial transport medium  Result Value Ref Range Status   SARS Coronavirus 2 by RT PCR NEGATIVE NEGATIVE Final    Comment: (NOTE) SARS-CoV-2 target nucleic acids are NOT DETECTED.  The SARS-CoV-2 RNA is generally detectable in upper  respiratory specimens during the acute phase of infection. The lowest concentration of SARS-CoV-2 viral copies this assay can detect is 138 copies/mL. A negative result does not preclude SARS-Cov-2 infection and should not be used as the sole basis for treatment or other patient management decisions. A negative result may occur with  improper specimen collection/handling, submission of specimen other than nasopharyngeal swab, presence of viral mutation(s) within the areas targeted by this assay, and inadequate number of viral copies(<138 copies/mL). A negative result must be combined with clinical observations, patient history, and epidemiological information. The expected result is Negative.  Fact Sheet for Patients:  EntrepreneurPulse.com.au  Fact Sheet for Healthcare Providers:  IncredibleEmployment.be  This test is no t yet approved or cleared by the [place] FDA and  has been authorized for detection and/or diagnosis of SARS-CoV-2 by FDA under an Emergency Use Authorization (EUA). This EUA will remain  in effect (meaning this test can be used) for the duration of the COVID-19 declaration under Section 564(b)(1) of the Act, 21 U.S.C.section 360bbb-3(b)(1), unless the authorization is terminated  or revoked sooner.       Influenza A by PCR NEGATIVE NEGATIVE Final   Influenza B by PCR NEGATIVE NEGATIVE Final    Comment: (NOTE) The Xpert Xpress SARS-CoV-2/FLU/RSV plus assay is intended as an aid in the diagnosis of influenza from Nasopharyngeal swab specimens and should not be used as a sole basis for treatment. Nasal washings and aspirates are unacceptable for Xpert Xpress SARS-CoV-2/FLU/RSV testing.  Fact Sheet for Patients: EntrepreneurPulse.com.au  Fact Sheet for Healthcare Providers: IncredibleEmployment.be  This test is not yet approved or cleared by the [place] FDA and has been  authorized for detection and/or diagnosis of SARS-CoV-2 by FDA under an Emergency Use Authorization (EUA). This EUA will remain in effect (meaning this test can be used) for the duration of the COVID-19 declaration under Section 564(b)(1) of the Act, 21 U.S.C. section 360bbb-3(b)(1), unless the authorization is terminated or revoked.  Performed at [facility], [place] [address] [place], [place] [address]        Radiology Studies: [facility] Chest Port 1 View  Result Date: 12/29/2020 CLINICAL DATA:  Concern for sepsis EXAM: PORTABLE CHEST 1 VIEW COMPARISON:  CT 08/06/2020, radiograph 06/26/2020 FINDINGS: There are increasing hazy opacities in the lungs. These partially obscure the costophrenic sulci though difficult to fully assess given collimation of the lung bases due to difficulty with patient positioning. Overall, appearance is increased from comparison prior and could reflect developing edema and or atelectasis with airspace disease less favored though not completely excluded. Pulmonary vascularity is slightly congested. Cardiomediastinal contours may be accentuated by the portable technique. Transcatheter aortic valve replacement. Aortic atherosclerosis. No acute osseous or soft tissue abnormality. Degenerative changes are present in the imaged spine and shoulders. IMPRESSION: Increasing hazy basilar opacities and pulmonary vascular congestion may reflect some developing edema and or atelectasis in the lung bases though underlying airspace disease is difficult to fully exclude particularly in the setting of possible sepsis. Aortic Atherosclerosis (ICD10-I70.0). Suboptimal imaging given difficulties with patient positioning. Electronically Signed   By: Lovena Le M.D.   On: 12/29/2020 19:59   DG Foot Complete Left  Result Date: 12/29/2020 CLINICAL DATA:  Left leg pain and swelling, initial encounter EXAM: LEFT FOOT - COMPLETE 3+ VIEW COMPARISON:  04/19/2019, 11/18/2020 FINDINGS:  Postsurgical changes are again noted in the medial malleolus stable in appearance from the prior exam. Tarsal degenerative changes are noted. Calcaneal spurring is seen. Flattening of the plantar arch is noted. Soft tissue ulcer is noted along the anterior aspect of the distal lower leg. No other ulcerations are seen. No acute fracture is noted. IMPRESSION: Chronic changes as described. Soft tissue wound is noted along the anterior aspect of the lower left leg. Electronically Signed   By: Inez Catalina M.D.   On: 12/29/2020 20:02   US Abdomen Limited RUQ (LIVER/GB)  Result Date: 12/30/2020 CLINICAL DATA:  70 year old male with history of elevated liver function tests. EXAM: ULTRASOUND ABDOMEN LIMITED RIGHT UPPER QUADRANT COMPARISON:  No priors. FINDINGS: Gallbladder: Gallbladder is moderately distended. No gallstones. Gallbladder wall appears mildly thickened measuring up to 6 mm in diameter. No pericholecystic fluid. Per report from the sonographer there was no sonographic Murphy's sign on examination. No sonographic Murphy sign noted by sonographer. Common bile duct: Diameter: 4 mm in the porta hepatis. Liver: No focal lesion identified. Within normal limits in parenchymal echogenicity. Portal vein is patent on color Doppler imaging with normal direction of blood flow towards the liver. Other: Right pleural effusion. IMPRESSION: 1. No gallstones. 2. Gallbladder is moderately distended with mild diffuse gallbladder wall thickening measuring up to 6 mm. This is of uncertain etiology and significance, but can be seen in the setting of intrinsic liver disease. Given the lack of a sonographic Murphy's sign, cholecystitis is not favored. 3. No intra or extrahepatic biliary ductal dilatation to suggest biliary tract obstruction. 4. Right pleural effusion. Electronically Signed   By: Vinnie Langton M.D.   On: 12/30/2020 09:26      Scheduled Meds:  aspirin  81 mg Oral Daily   atenolol  25 mg Oral Daily    clopidogrel  75 mg Oral Q breakfast   collagenase   Topical Daily   enoxaparin (LOVENOX) injection  40 mg Subcutaneous M62H   folic acid  1 mg Oral Daily   hydrALAZINE  75 mg Oral TID   ipratropium-albuterol  3 mL Nebulization TID   pantoprazole  40 mg Oral Daily   tamsulosin  0.4 mg Oral Daily   venlafaxine XR  75 mg Oral Q breakfast   Continuous Infusions:  cefTRIAXone (ROCEPHIN)  IV 2 g (12/31/20 0333)     LOS: 2 days      Time spent: 25 minutes   Dessa Phi, DO [facility] 12/31/2020, 12:06 PM   Available via Epic secure chat 7am-7pm After these hours, please refer to coverage provider listed on [email or web address]

## 2020-12-31 NOTE — Care Management Important Message (Signed)
Important Message  Patient Details IM Letter given to the Patient. Name: Jordan [name] MRN: [id number] Date of Birth: 08/10/1950   Medicare Important Message Given:  Yes     Kerin Salen 12/31/2020, 11:03 AM

## 2020-12-31 NOTE — Progress Notes (Signed)
Orthopedic Tech Progress Note Patient Details:  Jordan [name] [birth year] [id number]  Patient ID: [name], male   DOB: [birth year], 70 y.o.   MRN: [id number]  [name] 12/31/2020, 12:34 PM I lat unna boot applied

## 2021-01-01 ENCOUNTER — Encounter (HOSPITAL_COMMUNITY): Payer: Self-pay | Admitting: Internal Medicine

## 2021-01-01 DIAGNOSIS — I4891 Unspecified atrial fibrillation: Secondary | ICD-10-CM

## 2021-01-01 DIAGNOSIS — L03116 Cellulitis of left lower limb: Secondary | ICD-10-CM | POA: Diagnosis not present

## 2021-01-01 DIAGNOSIS — I10 Essential (primary) hypertension: Secondary | ICD-10-CM | POA: Diagnosis not present

## 2021-01-01 DIAGNOSIS — I5032 Chronic diastolic (congestive) heart failure: Secondary | ICD-10-CM

## 2021-01-01 LAB — BASIC METABOLIC PANEL
Anion gap: 7 (ref 5–15)
BUN: 21 mg/dL (ref 8–23)
CO2: 25 mmol/L (ref 22–32)
Calcium: 7.9 mg/dL — ABNORMAL LOW (ref 8.9–10.3)
Chloride: 104 mmol/L (ref 98–111)
Creatinine, Ser: 1.11 mg/dL (ref 0.61–1.24)
GFR, Estimated: >60 mL/min (ref >60)
Glucose, Bld: 80 mg/dL (ref 70–99)
Potassium: 4.7 mmol/L (ref 3.5–5.1)
Sodium: 136 mmol/L (ref 135–145)

## 2021-01-01 LAB — CBC
HCT: 26.2 % — ABNORMAL LOW (ref 39.0–52.0)
Hemoglobin: 7.8 g/dL — ABNORMAL LOW (ref 13.0–17.0)
MCH: 30.7 pg (ref 26.0–34.0)
MCHC: 29.8 g/dL — ABNORMAL LOW (ref 30.0–36.0)
MCV: 103.1 fL — ABNORMAL HIGH (ref 80.0–100.0)
Platelets: 171 10*3/uL (ref 150–400)
RBC: 2.54 MIL/uL — ABNORMAL LOW (ref 4.22–5.81)
RDW: 18.3 % — ABNORMAL HIGH (ref 11.5–15.5)
WBC: 7.8 10*3/uL (ref 4.0–10.5)
nRBC: 0 % (ref 0.0–0.2)

## 2021-01-01 LAB — MAGNESIUM: Magnesium: 1.9 mg/dL (ref 1.7–2.4)

## 2021-01-01 MED ORDER — APIXABAN 5 MG PO TABS
5.0000 mg | ORAL_TABLET | Freq: Two times a day (BID) | ORAL | Status: DC
  Administered 2021-01-01 – 2021-01-05 (×8): 5 mg via ORAL
  Filled 2021-01-01 (×8): qty 1

## 2021-01-01 MED ORDER — IPRATROPIUM-ALBUTEROL 0.5-2.5 (3) MG/3ML IN SOLN
3.0000 mL | Freq: Two times a day (BID) | RESPIRATORY_TRACT | Status: DC
  Administered 2021-01-02 – 2021-01-05 (×7): 3 mL via RESPIRATORY_TRACT
  Filled 2021-01-01 (×7): qty 3

## 2021-01-01 MED ORDER — METOPROLOL TARTRATE 50 MG PO TABS
50.0000 mg | ORAL_TABLET | Freq: Two times a day (BID) | ORAL | Status: DC
  Administered 2021-01-01 – 2021-01-05 (×7): 50 mg via ORAL
  Filled 2021-01-01 (×8): qty 1

## 2021-01-01 MED ORDER — LORAZEPAM 1 MG PO TABS
1.0000 mg | ORAL_TABLET | Freq: Two times a day (BID) | ORAL | Status: DC | PRN
  Administered 2021-01-01 – 2021-01-04 (×4): 1 mg via ORAL
  Filled 2021-01-01 (×5): qty 1

## 2021-01-01 MED ORDER — HYDRALAZINE HCL 25 MG PO TABS
25.0000 mg | ORAL_TABLET | Freq: Three times a day (TID) | ORAL | Status: DC
  Administered 2021-01-01 – 2021-01-03 (×4): 25 mg via ORAL
  Filled 2021-01-01 (×6): qty 1

## 2021-01-01 NOTE — Progress Notes (Addendum)
PROGRESS NOTE    Jordan [name]  ZHY:[id number] DOB: 01-27-1951 DOA: 12/29/2020 PCP: [name], MD   Brief Narrative:  Jordan [name] is a 70 y.o. 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  Patient follows with Dr. Sharol Given (orthopedics) due to worsening right knee arthritis, he had a fall over the weekend and he was seen in the ED on July 10 and was noted to have a right knee contusion with open wound left leg wound and was discharged home to continue with home medications.  He went to his PCPs office 7/12 and EMS was activated for patient to be taken to the ED. Left foot x-ray showed chronic changes as described. Soft tissue wound is noted along the anterior aspect of the lower left leg. Patient was treated with Unasyn due to presumed cellulitis.  Assessment & Plan:   Left leg cellulitis: same -Sepsis ruled out, does not meet criteria -Left foot xray: Soft tissue wound is noted along the anterior aspect of the lower left leg. -Remained afebrile, no leukocytosis, blood culture: No growth till date -Appreciate wound care recommendations. -Cont. Rocephin-Day #3 -Continue as needed pain medications -PT recommended SNF  Chronic diastolic heart failure -BNP 805 -CXR: Increasing hazy basilar opacities and pulmonary vascular congestion may reflect some developing edema and or atelectasis in the lung bases -Patient appears euvolemic on exam -Echo from 2/22 shows ejection fraction of 60 to 65% with grade 2 diastolic dysfunction. -Strict INO's and daily weight  New onset Afib: -No previous history of A. fib  as per records. -Telemetry shows A. fib with heart rate in 130s -EKG shows A. fib with heart rate in 96.  Magnesium level: WNL -Continue atenolol. -We will consult cardiology as patient has extensive cardiac history  Elevated B12 level -Patient reports that he received vitamin B12 supplementation 7/12 at his PCPs office.  Has had vitamin B12 deficiency for 4 to 5 years requiring monthly injections   AS status post TAVR -Continue aspirin, plavix   HTN -Continue tenormin, hydralazine   Elevated liver enzyme -RUQ [place]: Gallbladder is moderately distended with mild diffuse gallbladder wall thickening measuring up to 6 mm. This is of uncertain etiology and significance, but can be seen in the setting of intrinsic liver disease. Given the lack of a sonographic Murphy's sign, cholecystitis is not favored. -Liver enzymes within normal limits   GERD: Continue PPI  BPH: Continue Flomax  Depression/anxiety: Continue Effexor, Ativan as needed  Mild AKI: -Creatinine 1.27.  Avoid nephrotoxic medication.  Continue to monitor renal function closely.  DVT prophylaxis: Lovenox Code Status: Full code Family Communication:  None present at bedside.  Plan of care discussed with patient in length and he verbalized understanding and agreed with it. Disposition Plan: SNF  Consultants:  Cardiology  Procedures:  None  Antimicrobials:  Rocephin  Status is: Inpatient  Remains inpatient appropriate because:IV treatments appropriate due to intensity of illness or inability to take PO  Dispo: The patient is from: Home              Anticipated d/c is to: SNF              Patient currently is  not medically stable to d/c.   Difficult to place patient No         Subjective: Patient seen and examined.  Resting comfortably in the bed.  Reports that he feels better but continues to have pain in lower extremities.  No fever.  Nurse notified that patient's telemetry shows A. fib with heart rate in  130s.  Patient remains asymptomatic.  Objective: Vitals:   01/01/21 0755 01/01/21 0910 01/01/21 1110 01/01/21 1310  BP:  119/63 106/70 107/60  Pulse:  (!) 130 100 (!) 102  Resp:  20 (!) 30 (!) 25  Temp:  98.5 F (36.9 C) 98 F (36.7 C) 97.8 F (36.6 C)  TempSrc:  Oral Oral Oral  SpO2: 97% 95% 97% 95%  Weight:      Height:        Intake/Output Summary (Last 24 hours) at 01/01/2021 1329 Last data filed at 01/01/2021 1325 Gross per 24 hour  Intake 472 ml  Output 1400 ml  Net -928 ml   Filed Weights   12/29/20 2021  Weight: 102.1 kg    Examination:  General exam: Appears calm and comfortable  Respiratory system: Clear to auscultation. Respiratory effort normal. Cardiovascular system: S1 & S2 heard, RRR. No JVD, murmurs, rubs, gallops or clicks. No pedal edema. Gastrointestinal system: Abdomen is nondistended, soft and nontender. No organomegaly or masses felt. Normal bowel sounds heard. Central nervous system: Alert and oriented. No focal neurological deficits. Extremities: Dressing dry and intact in lower extremities. Skin: No rashes, lesions or ulcers Psychiatry: Judgement and insight appear normal. Mood & affect appropriate.    Data Reviewed: I have personally reviewed following labs and imaging studies  CBC: Recent Labs  Lab 12/29/20 1930 12/30/20 0412 12/30/20 0534 12/31/20 0444 01/01/21 0436  WBC 7.5 6.6  --  6.9 7.8  NEUTROABS 6.1  --   --   --   --   HGB 8.4* 7.4* 7.3* 7.4* 7.8*  HCT 27.2* 24.2* 24.1* 24.1* 26.2*  MCV 100.7* 98.8  --  100.8* 103.1*  PLT 247 266  --  229 737   Basic Metabolic Panel: Recent Labs  Lab 12/29/20 1930 12/30/20 0412 12/31/20 0444 01/01/21 0436  NA 133* 135 138 136  K 4.3 4.4 4.5 4.7  CL 102 103 105 104  CO2 24 25 28 25   GLUCOSE 94 86 84 80  BUN 23 20 22 21   CREATININE 1.38* 1.13 1.27* 1.11  CALCIUM 8.4* 8.2* 8.0* 7.9*  MG  --  2.0  --   --   PHOS  --  3.6  --   --    GFR: Estimated Creatinine Clearance: 76.6  mL/min (by C-G formula based on SCr of 1.11 mg/dL). Liver Function Tests: Recent Labs  Lab 12/29/20 1930 12/30/20 0412 12/31/20 0444  AST 45* 39 31  ALT 22 20 17   ALKPHOS 110 95 88  BILITOT 0.8 1.0 0.6  PROT 5.9* 5.4* 5.2*  ALBUMIN 3.1* 2.8* 2.6*   No results for input(s): LIPASE, AMYLASE in the last 168 hours. No results for input(s): AMMONIA in the last 168 hours. Coagulation Profile: Recent Labs  Lab 12/29/20 1930 12/30/20 0412  INR 1.0 1.1   Cardiac Enzymes: No results for input(s): CKTOTAL, CKMB, CKMBINDEX, TROPONINI in the last 168 hours. BNP (last 3 results) No results for input(s): PROBNP in the last 8760 hours. HbA1C: No results for input(s): HGBA1C in the last 72 hours. CBG: No results for input(s): GLUCAP in the last 168 hours.  Lipid Profile: No results for input(s): CHOL, HDL, LDLCALC, TRIG, CHOLHDL, LDLDIRECT in the last 72 hours. Thyroid Function Tests: No results for input(s): TSH, T4TOTAL, FREET4, T3FREE, THYROIDAB in the last 72 hours. Anemia Panel: Recent Labs    12/30/20 0412  VITAMINB12 >7,500*  FOLATE 6.3   Sepsis Labs: Recent Labs  Lab 12/29/20 1829  LATICACIDVEN 1.3    Recent Results (from the past 240 hour(s))  Blood Culture (routine x 2)     Status: None (Preliminary result)   Collection Time: 12/29/20  6:35 PM   Specimen: BLOOD  Result Value Ref Range Status   Specimen Description   Final    BLOOD RIGHT ANTECUBITAL Performed at [facility] [address], [place], [place] [address]    Special Requests   Final    BOTTLES DRAWN AEROBIC AND ANAEROBIC Blood Culture adequate volume Performed at [facility] [address] [place], [place] [address]    Culture   Final    NO GROWTH 3 DAYS Performed at [facility], [place] [address], [place], [place] [address]    Report Status PENDING  Incomplete  Blood Culture (routine x 2)     Status: None (Preliminary result)   Collection Time:  12/29/20  6:41 PM   Specimen: BLOOD  Result Value Ref Range Status   Specimen Description   Final    BLOOD LEFT ANTECUBITAL Performed at [facility] [address], [place], [place] [address]    Special Requests   Final    BOTTLES DRAWN AEROBIC AND ANAEROBIC Blood Culture adequate volume Performed at [facility] [address] [place], [place] [address]    Culture   Final    NO GROWTH 3 DAYS Performed at [facility], [place] [address] [place], [place] [address]    Report Status PENDING  Incomplete  Urine culture     Status: Abnormal   Collection Time: 12/29/20  8:22 PM   Specimen: In/Out Cath Urine  Result Value Ref Range Status   Specimen Description   Final    IN/OUT CATH URINE Performed at [facility] [address], [place], [place] [address]    Special Requests   Final    NONE Performed at [facility], [place] [address] [place], [place] [address]    Culture MULTIPLE SPECIES PRESENT, SUGGEST RECOLLECTION (A)  Final   Report Status 12/31/2020 FINAL  Final  Resp Panel by RT-PCR (Flu A&B, Covid) Nasopharyngeal Swab     Status: None   Collection Time: 12/29/20 10:50 PM   Specimen: Nasopharyngeal Swab; Nasopharyngeal(NP) swabs in vial transport medium  Result Value Ref Range Status   SARS Coronavirus 2 by RT PCR NEGATIVE NEGATIVE Final    Comment: (NOTE) SARS-CoV-2 target nucleic acids are NOT DETECTED.  The SARS-CoV-2 RNA is generally detectable in upper respiratory specimens during the acute phase of infection. The lowest concentration of SARS-CoV-2 viral copies this assay can detect is 138 copies/mL. A negative result does not preclude SARS-Cov-2 infection and should not be used as the sole basis for treatment or other patient management decisions. A negative result may occur with  improper specimen collection/handling, submission of specimen other than nasopharyngeal swab, presence  of viral mutation(s) within the areas targeted by this assay, and inadequate number of viral copies(<138 copies/mL). A negative result must be combined with clinical observations, patient history, and epidemiological information. The expected result is Negative.  Fact  Sheet for Patients:  EntrepreneurPulse.com.au  Fact Sheet for Healthcare Providers:  IncredibleEmployment.be  This test is no t yet approved or cleared by the [place] FDA and  has been authorized for detection and/or diagnosis of SARS-CoV-2 by FDA under an Emergency Use Authorization (EUA). This EUA will remain  in effect (meaning this test can be used) for the duration of the COVID-19 declaration under Section 564(b)(1) of the Act, 21 U.S.C.section 360bbb-3(b)(1), unless the authorization is terminated  or revoked sooner.       Influenza A by PCR NEGATIVE NEGATIVE Final   Influenza B by PCR NEGATIVE NEGATIVE Final    Comment: (NOTE) The Xpert Xpress SARS-CoV-2/FLU/RSV plus assay is intended as an aid in the diagnosis of influenza from Nasopharyngeal swab specimens and should not be used as a sole basis for treatment. Nasal washings and aspirates are unacceptable for Xpert Xpress SARS-CoV-2/FLU/RSV testing.  Fact Sheet for Patients: EntrepreneurPulse.com.au  Fact Sheet for Healthcare Providers: IncredibleEmployment.be  This test is not yet approved or cleared by the [place] FDA and has been authorized for detection and/or diagnosis of SARS-CoV-2 by FDA under an Emergency Use Authorization (EUA). This EUA will remain in effect (meaning this test can be used) for the duration of the COVID-19 declaration under Section 564(b)(1) of the Act, 21 U.S.C. section 360bbb-3(b)(1), unless the authorization is terminated or revoked.  Performed at [facility], [place] [address] [place], [place] [address]        Radiology Studies: No results found.  Scheduled Meds:  aspirin  81 mg Oral Daily   atenolol  25 mg Oral Daily   clopidogrel  75 mg Oral Q breakfast   collagenase   Topical Daily   enoxaparin (LOVENOX) injection  40 mg Subcutaneous H74B   folic acid  1 mg Oral Daily   hydrALAZINE  75 mg Oral TID   ipratropium-albuterol  3 mL Nebulization TID   pantoprazole  40 mg Oral Daily   tamsulosin  0.4 mg Oral Daily   venlafaxine XR  75 mg Oral Q breakfast   Continuous Infusions:  cefTRIAXone (ROCEPHIN)  IV 2 g (12/31/20 2244)     LOS: 3 days   Time spent: 45 minutes   Sekai Nayak Loann Quill, MD [facility]  If 7PM-7AM, please contact night-coverage [email or web address] 01/01/2021, 1:29 PM

## 2021-01-01 NOTE — Progress Notes (Signed)
   01/01/21 0910  Assess: MEWS Score  Temp 98.5 F (36.9 C)  BP 119/63  Pulse Rate (!) 130  Resp 20  Level of Consciousness Alert  SpO2 95 %  O2 Device Nasal Cannula  O2 Flow Rate (L/min) 2 L/min  Assess: MEWS Score  MEWS Temp 0  MEWS Systolic 0  MEWS Pulse 3  MEWS RR 0  MEWS LOC 0  MEWS Score 3  MEWS Score Color Yellow  Assess: if the MEWS score is Yellow or Red  Were vital signs taken at a resting state? Yes  Focused Assessment No change from prior assessment  Does the patient meet 2 or more of the SIRS criteria? No  MEWS guidelines implemented *See Row Information* Yes  Treat  MEWS Interventions Administered scheduled meds/treatments  Pain Scale 0-10  Pain Score 6  Pain Type Acute pain  Pain Location Leg  Pain Orientation Right;Left  Pain Descriptors / Indicators Discomfort  Pain Frequency Intermittent  Pain Onset On-going  Patients Stated Pain Goal 0  Pain Intervention(s) Medication (See eMAR)  Complains of Anxiety  Neuro symptoms relieved by Anti-anxiety medication  Take Vital Signs  Increase Vital Sign Frequency  Yellow: Q 2hr X 2 then Q 4hr X 2, if remains yellow, continue Q 4hrs  Escalate  MEWS: Escalate Yellow: discuss with charge nurse/RN and consider discussing with provider and RRT  Notify: Charge Nurse/RN  Name of Charge Nurse/RN Notified Hannah, RN  Date Charge Nurse/RN Notified 01/01/21  Time Charge Nurse/RN Notified 0930  Notify: Provider  Provider Name/Title Pahwani, MD  Date Provider Notified 01/01/21  Time Provider Notified 1040  Notification Type Page  Notification Reason Change in status (Heart rhythm showing Afib)  Provider response No new orders  Date of Provider Response 01/01/21  Time of Provider Response 1045  Document  Patient Outcome Other (Comment);Stabilized after interventions (remains in Afib)  Progress note created (see row info) Yes  Assess: SIRS CRITERIA  SIRS Temperature  0  SIRS Pulse 1  SIRS Respirations  0  SIRS  WBC 0  SIRS Score Sum  1   Pt in Yellow MEW d/t increased HR of 130. Administered scheduled medications. Agricultural consultant notified. MD notified. Pt has been in and out of Afib. MD notified of new onset Afib. No new orders at this time. Yellow MEWS implemented.

## 2021-01-01 NOTE — Discharge Instructions (Signed)

## 2021-01-01 NOTE — Evaluation (Signed)
Physical Therapy Evaluation Patient Details Name: Jordan [name] MRN: [id number] DOB: 06-22-1950 Today's Date: 01/01/2021   History of Present Illness  Patient is 70 y.o. male presented to ED with Lt LE swelling, redness, and pain. Pt recently seen in ED on July 10th due to a fall over the weeken and was noted to have a right knee contusion with open wound left leg wound and was discharged home to continue with home medications. 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    Clinical Impression  Jordan [name] is 70 y.o. male admitted with above HPI and diagnosis. Patient is currently limited by functional impairments below (see PT problem list). Patient lives alone and is modified independent with RW for household mobility at baseline. Patient unable to tolerate sitting EOB for >1 minute and requires mod assist to complete bed mobility secondary to pain. Patient will benefit from continued skilled PT interventions to address impairments and progress independence with mobility, recommending SNF follow up for therapy and LE wound care. Acute PT will follow and progress as able.       Follow Up Recommendations SNF;Supervision/Assistance - 24 hour    Equipment Recommendations  None recommended by PT    Recommendations for Other Services       Precautions / Restrictions Precautions Precautions: Fall Precaution Comments: watch HR, afib Restrictions Weight Bearing Restrictions: No      Mobility  Bed Mobility Overal bed mobility: Needs Assistance Bed Mobility: Supine to Sit;Sit to Supine     Supine to sit: Mod assist;HOB elevated Sit to supine: Mod assist   General bed mobility comments: Mod assist to bring Bil LE's off EOB and to raise  trunk upright. pt unable to tolerate sitting EOB for more than ~1 minute due to Rt>Lt knee pain and required mod assist to return to supine.    Transfers                 General transfer comment: deferred due to HR elevated with afib rhythm and knee pain  Ambulation/Gait                Stairs            Wheelchair Mobility    Modified Rankin (Stroke Patients Only)       Balance Overall balance assessment: Needs assistance Sitting-balance support: Bilateral upper extremity supported;Feet supported Sitting balance-Leahy Scale: Poor                                       Pertinent Vitals/Pain Pain Assessment: Faces Faces Pain Scale: Hurts little more Pain Location: Rt Knee Pain Descriptors / Indicators: Aching;Discomfort;Guarding Pain Intervention(s): Limited activity within patient's tolerance;Monitored during session;Repositioned    Home Living Family/patient expects to be discharged to:: Private residence Living Arrangements: Alone Available Help at Discharge: Family;Neighbor;Friend(s) Type of Home: House Home Access: Stairs to enter Entrance Stairs-Rails: Psychiatric nurse of Steps: 8 Home Layout: One level Home Equipment: Shower seat - built in;Cane - single point;Walker - 2 wheels;Bedside commode;Grab bars - tub/shower;Grab bars - toilet      Prior Function Level of Independence: Independent with assistive device(s)  Comments: pt uses RW for mobility and occasionally cane. Rollator is too wide to use in home. pt still drives but his CNA's do food shopping, cooking, cleaning. He does bath and dress himself.     Hand Dominance        Extremity/Trunk Assessment   Upper Extremity Assessment Upper Extremity Assessment: Overall WFL for tasks assessed    Lower Extremity Assessment Lower Extremity Assessment: Generalized weakness (unna boots on bil LE's)    Cervical / Trunk Assessment Cervical /  Trunk Assessment: Kyphotic  Communication   Communication: No difficulties  Cognition Arousal/Alertness: Awake/alert Behavior During Therapy: WFL for tasks assessed/performed Overall Cognitive Status: Within Functional Limits for tasks assessed                                        General Comments      Exercises     Assessment/Plan    PT Assessment Patient needs continued PT services  PT Problem List Decreased strength;Decreased activity tolerance;Decreased balance;Decreased mobility;Decreased knowledge of use of DME;Decreased safety awareness;Decreased knowledge of precautions       PT Treatment Interventions DME instruction;Functional mobility training;Therapeutic activities;Therapeutic exercise;Balance training;Gait training;Neuromuscular re-education;Patient/family education    PT Goals (Current goals can be found in the Care Plan section)  Acute Rehab PT Goals Patient Stated Goal: LE's stop hurting and regain mobility PT Goal Formulation: With patient Time For Goal Achievement: 01/15/21 Potential to Achieve Goals: Fair    Frequency Min 2X/week   Barriers to discharge        Co-evaluation               AM-PAC PT "6 Clicks" Mobility  Outcome Measure Help needed turning from your back to your side while in a flat bed without using bedrails?: A Lot Help needed moving from lying on your back to sitting on the side of a flat bed without using bedrails?: A Lot Help needed moving to and from a bed to a chair (including a wheelchair)?: Total Help needed standing up from a chair using your arms (e.g., wheelchair or bedside chair)?: A Lot Help needed to walk in hospital room?: Total Help needed climbing 3-5 steps with a railing? : Total 6 Click Score: 9    End of Session   Activity Tolerance: Patient limited by pain Patient left: in bed;with call bell/phone within reach;with bed alarm set Nurse Communication: Mobility status PT Visit Diagnosis:  Muscle weakness (generalized) (M62.81);Difficulty in walking, not elsewhere classified (R26.2);Other abnormalities of gait and mobility (R26.89)    Time: 0109-3235 PT Time Calculation (min) (ACUTE ONLY): 13 min   Charges:   PT Evaluation $PT Eval Moderate Complexity: 1 Mod          Verner Mould, DPT Acute Rehabilitation Services Office [phone] Pager [phone]   Jacques Navy 01/01/2021, 1:20 PM

## 2021-01-01 NOTE — Consult Note (Signed)
Cardiology Consultation:   Patient ID: Jordan [name]; [id number]; 08/07/1950   Admit date: 12/29/2020 Date of Consult: 01/01/2021  Primary Care Provider: [name], MD Primary Cardiologist: Candee Furbish, MD     Patient Profile:   Jordan [name] is a 70 y.o. male with a hx of TAVR, CAD who is being seen today for the evaluation of atrial fib at the request of Dr. Doristine Bosworth.  History of Present Illness:   Jordan [name] has a history of TAVR as below.  He was admitted to the hospital with left leg swelling, erythema and pain.  He is status post full.  He is being managed for left leg cellulitis.  He has a history of diastolic heart failure.  Echo from earlier this year demonstrates well-preserved ejection fraction.  He is found this admission to be in atrial fibrillation which is new.  Rates have been as high as 130s.  I do not see that he has had a reported atrial fibrillation in the past.  He has had some skipped beats that he has noted but he does check his pulse oximeter and he has not noted it to be particularly fast.  He has not had any presyncope or syncope.  He denies any chest pressure, neck or arm discomfort.  He has some chronic dyspnea and uses as needed oxygen at home.  He does have a smoking history.  He has not had any weight gain or edema.  He denies any PND or orthopnea.  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 Burt Knack and Dr. Cyndia Bent   Severe aortic stenosis    Severe aortic  insufficiency, moderate to severe aortic stenosis   Sleep apnea    CPAP    Venous insufficiency    managed by ortho (Dr. Sharol Given)    Past Surgical History:  Procedure Laterality Date   ANKLE SURGERY Right    Dr. Marcelino Scot, has pins   COLONOSCOPY W/ POLYPECTOMY     KNEE ARTHROSCOPY Right    Dr. Noemi Chapel   MULTIPLE EXTRACTIONS WITH ALVEOLOPLASTY Bilateral 05/22/2020   Procedure: MULTIPLE EXTRACTION WITH ALVEOLOPLASTY;  Surgeon: Diona Browner, DMD;  Location: [facility] OR;  Service: Oral Surgery;  Laterality: Bilateral;   right foot surgery,rt great toe callus removal     RIGHT/LEFT HEART CATH AND CORONARY ANGIOGRAPHY N/A 03/06/2020   Procedure: RIGHT/LEFT HEART CATH AND CORONARY ANGIOGRAPHY;  Surgeon: Sherren Mocha, MD;  Location: [facility];  Service: Cardiovascular;  Laterality: N/A;   TEE WITHOUT CARDIOVERSION N/A 03/09/2020   Procedure: TRANSESOPHAGEAL ECHOCARDIOGRAM (TEE);  Surgeon: Dorothy Spark, MD;  Location: [facility] ENDOSCOPY;  Service: Cardiovascular;  Laterality: N/A;   TEE WITHOUT CARDIOVERSION N/A 06/30/2020   Procedure: TRANSESOPHAGEAL ECHOCARDIOGRAM (TEE);  Surgeon: Sherren Mocha, MD;  Location: [facility];  Service: Open Heart Surgery;  Laterality: N/A;   TONSILLECTOMY AND ADENOIDECTOMY     age  14   TRANSCATHETER AORTIC VALVE REPLACEMENT, TRANSFEMORAL N/A 06/30/2020   Procedure: TRANSCATHETER AORTIC VALVE REPLACEMENT, TRANSFEMORAL;  Surgeon: Sherren Mocha, MD;  Location: [facility];  Service: Open Heart Surgery;  Laterality: N/A;     Home Medications:  Prior to Admission medications   Medication Sig Start Date End Date Taking? Authorizing Provider  aspirin 81 MG tablet Take 81 mg by mouth daily.   Yes [provider]  atenolol (TENORMIN) 25 MG tablet Take 1 tablet (25 mg total) by mouth daily. 03/18/20  Yes Weaver, [name] T, PA-C  atorvastatin (LIPITOR) 80 MG tablet Take 80 mg by mouth daily after supper.    Yes [provider]  budesonide-formoterol (SYMBICORT)  160-4.5 MCG/ACT inhaler Inhale 2 puffs into the lungs in the morning and at bedtime. Patient taking differently: Inhale 2 puffs into the lungs daily. 03/12/20  Yes Aline August, MD  butalbital-acetaminophen-caffeine (FIORICET, ESGIC) 50-325-40 MG per tablet Take 1 tablet by mouth 2 (two) times daily as needed for migraine.    Yes [provider]  celecoxib (CELEBREX) 200 MG capsule Take 1 capsule (200 mg total) by mouth 2 (two) times daily. 09/23/20  Yes Persons, Bevely Palmer, [place]  clopidogrel (PLAVIX) 75 MG tablet Take 1 tablet (75 mg total) by mouth daily with breakfast. 07/03/20  Yes Eileen Stanford, PA-C  folic acid (FOLVITE) 1 MG tablet Take 1 tablet (1 mg total) by mouth daily. 11/04/14  Yes [name], MD  furosemide (LASIX) 40 MG tablet Take 1 tablet (40 mg total) by mouth daily. 03/12/20  Yes Aline August, MD  hydrALAZINE (APRESOLINE) 50 MG tablet Take 1.5 tablets (75 mg total) by mouth 3 (three) times daily. Patient taking differently: Take 50 mg by mouth 2 (two) times daily. 07/23/20 07/18/21 Yes Eileen Stanford, PA-C  ibuprofen (ADVIL) 200 MG tablet Take 400 mg by mouth every 6 (six) hours as needed (for headaches).   Yes [provider]  ipratropium-albuterol (DUONEB) 0.5-2.5 (3) MG/3ML SOLN Take 3 mLs by nebulization every 6 (six) hours as needed (shortness of breath/wheezing). 03/12/20  Yes Aline August, MD  LORazepam (ATIVAN) 1 MG tablet Take 0.5 tablets (0.5 mg total) by mouth 2 (two) times daily as needed for anxiety. 03/12/20  Yes Aline August, MD  methocarbamol (ROBAXIN) 750 MG tablet Take 750 mg by mouth as needed for muscle spasms. 06/03/20  Yes [provider]  montelukast (SINGULAIR) 10 MG tablet Take 10 mg by mouth daily. 02/07/20  Yes [provider]  Multiple Vitamin (MULTIVITAMIN WITH MINERALS) TABS Take 1 tablet by mouth daily.   Yes [provider]  nystatin cream (MYCOSTATIN) Apply 1 application topically 2 (two) times  daily. 06/24/20  Yes Eileen Stanford, PA-C  oxyCODONE-acetaminophen (PERCOCET) 5-325 MG tablet Take 1 tablet by mouth every 4 (four) hours as needed. Patient taking differently: Take 1 tablet by mouth as needed (knee pain). 05/22/20  Yes Diona Browner, DMD  pantoprazole (PROTONIX) 40 MG tablet Take 1 tablet (40 mg total) by mouth daily. 07/03/20  Yes Eileen Stanford, PA-C  promethazine (PHENERGAN) 25 MG tablet Take 25 mg by mouth every 8 (eight) hours as needed for nausea/vomiting. 05/14/20  Yes [provider]  venlafaxine XR (EFFEXOR-XR) 75 MG 24 hr capsule Take 75 mg by mouth in the morning and at bedtime. MUST HAVE BRAND NAME ONLY   Yes [provider]    Inpatient Medications: Scheduled Meds:  apixaban  5 mg Oral BID   aspirin  81  mg Oral Daily   collagenase   Topical Daily   folic acid  1 mg Oral Daily   hydrALAZINE  25 mg Oral TID   [START ON 01/02/2021] ipratropium-albuterol  3 mL Nebulization BID   metoprolol tartrate  50 mg Oral BID   pantoprazole  40 mg Oral Daily   tamsulosin  0.4 mg Oral Daily   venlafaxine XR  75 mg Oral Q breakfast   Continuous Infusions:  cefTRIAXone (ROCEPHIN)  IV 2 g (12/31/20 2244)   PRN Meds: acetaminophen, ipratropium-albuterol, LORazepam, methocarbamol, oxyCODONE-acetaminophen, promethazine  Allergies:    Allergies  Allergen Reactions   Altace [Ramipril] Other (See Comments)    Dizziness, migraine, weakness- "there is dye in it"   Codeine Itching   Naproxen Itching    Social History:   Social History   Socioeconomic History   Marital status: Single    Spouse name: Not on file   Number of children: 0   Years of education: Not on file   Highest education level: Not on file  Occupational History   Occupation: retired Youth worker: RETIRED  Tobacco Use   Smoking status: Some Days    Packs/day: 0.75    Years: 55.00    Pack years: 41.25    Types: Cigarettes   Smokeless tobacco: Never   Vaping Use   Vaping Use: Never used  Substance and Sexual Activity   Alcohol use: No    Alcohol/week: 0.0 standard drinks   Drug use: No   Sexual activity: Yes    Birth control/protection: Condom  Other Topics Concern   Not on file  Social History Narrative   Lives alone.     Social Determinants of Health   Financial Resource Strain: Not on file  Food Insecurity: Not on file  Transportation Needs: Not on file  Physical Activity: Not on file  Stress: Not on file  Social Connections: Not on file  Intimate Partner Violence: Not on file    Family History:    Family History  Problem Relation Age of Onset   Hypertension Mother    Alzheimer's disease Mother    Stroke Father        x 3   Heart attack Father    Congestive Heart Failure Father    Diabetes Other        mat great aunts   Colon cancer Neg Hx    Rectal cancer Neg Hx    Esophageal cancer Neg Hx      ROS:  Please see the history of present illness.  ROS  All other ROS reviewed and negative.     Physical Exam/Data:   Vitals:   01/01/21 0910 01/01/21 1110 01/01/21 1310 01/01/21 1610  BP: 119/63 106/70 107/60 (!) 113/52  Pulse: (!) 130 100 (!) 102 95  Resp: 20 (!) 30 (!) 25   Temp: 98.5 F (36.9 C) 98 F (36.7 C) 97.8 F (36.6 C)   TempSrc: Oral Oral Oral   SpO2: 95% 97% 95% 98%  Weight:      Height:        Intake/Output Summary (Last 24 hours) at 01/01/2021 1659 Last data filed at 01/01/2021 1325 Gross per 24 hour  Intake 472 ml  Output 1400 ml  Net -928 ml   Filed Weights   12/29/20 2021  Weight: 102.1 kg   Body mass index is 30.52 kg/m.  GENERAL:  Mildly chronically ill appearing, lying flat HEENT:   Pupils equal round and reactive,  fundi not visualized, oral mucosa unremarkable NECK:  No  jugular venous distention, waveform within normal limits, carotid upstroke brisk and symmetric, no bruits, no thyromegaly LYMPHATICS:  No cervical, inguinal adenopathy LUNGS:   Clear to auscultation  bilaterally BACK:  No CVA tenderness CHEST:   Unremarkable HEART:  PMI not displaced or sustained,S1 and S2 within normal limits, no S3, no clicks, no rubs, no murmurs, distant heart sounds ABD:  Flat, positive bowel sounds normal in frequency in pitch, no bruits, no rebound, no guarding, no midline pulsatile mass, no hepatomegaly, no splenomegaly EXT:  2 plus pulses throughout, no bilateral leg edema both wrapped edema, no cyanosis no clubbing SKIN:  No rashes no nodules NEURO:   Cranial nerves II through XII grossly intact, motor grossly intact throughout PSYCH:    Cognitively intact, oriented to person place and time   EKG:  The EKG was personally reviewed and demonstrates: Atrial fibrillation rate 96, axis within normal limits, intervals within normal limits, nonspecific inferior T wave changes. Telemetry:  Telemetry was personally reviewed and demonstrates: Atrial fibrillation with rates around 100  Relevant CV Studies:  ECHO:  07/23/20  1. Left ventricular ejection fraction, by estimation, is 60 to 65%. The  left ventricle has normal function. The left ventricle has no regional  wall motion abnormalities. The left ventricular internal cavity size was  mildly to moderately dilated. There  is mild concentric left ventricular hypertrophy. Left ventricular  diastolic parameters are consistent with Grade II diastolic dysfunction  (pseudonormalization).   2. Right ventricular systolic function is normal. The right ventricular  size is normal. There is mildly elevated pulmonary artery systolic  pressure.   3. Left atrial size was moderately dilated.   4. Right atrial size was moderately dilated.   5. The mitral valve is degenerative. Trivial mitral valve regurgitation.   6. Perivalvular leak at the native Liberal commissure.. The aortic valve  has been repaired/replaced. Aortic valve regurgitation is mild to  moderate. There is a 26 mm Sapien prosthetic (TAVR) valve present in the   aortic position. Procedure Date:  06/30/2020. Aortic valve mean gradient measures 14.6 mmHg.   7. The inferior vena cava is dilated in size with >50% respiratory  variability, suggesting right atrial pressure of 8 mmHg.  Laboratory Data:  Chemistry Recent Labs  Lab 12/30/20 0412 12/31/20 0444 01/01/21 0436  NA 135 138 136  K 4.4 4.5 4.7  CL 103 105 104  CO2 25 28 25   GLUCOSE 86 84 80  BUN 20 22 21   CREATININE 1.13 1.27* 1.11  CALCIUM 8.2* 8.0* 7.9*  GFRNONAA >60 >60 >60  ANIONGAP 7 5 7     Recent Labs  Lab 12/29/20 1930 12/30/20 0412 12/31/20 0444  PROT 5.9* 5.4* 5.2*  ALBUMIN 3.1* 2.8* 2.6*  AST 45* 39 31  ALT 22 20 17   ALKPHOS 110 95 88  BILITOT 0.8 1.0 0.6   Hematology Recent Labs  Lab 12/30/20 0412 12/30/20 0534 12/31/20 0444 01/01/21 0436  WBC 6.6  --  6.9 7.8  RBC 2.45*  --  2.39* 2.54*  HGB 7.4* 7.3* 7.4* 7.8*  HCT 24.2* 24.1* 24.1* 26.2*  MCV 98.8  --  100.8* 103.1*  MCH 30.2  --  31.0 30.7  MCHC 30.6  --  30.7 29.8*  RDW 18.4*  --  18.5* 18.3*  PLT 266  --  229 171   Cardiac EnzymesNo results for input(s): TROPONINI in the last 168 hours. No results for input(s): TROPIPOC in the  last 168 hours.  BNP Recent Labs  Lab 12/30/20 0412  BNP 805.0*    DDimer No results for input(s): DDIMER in the last 168 hours.  Radiology/Studies:  DG Chest Port 1 View  Result Date: 12/29/2020 CLINICAL DATA:  Concern for sepsis EXAM: PORTABLE CHEST 1 VIEW COMPARISON:  CT 08/06/2020, radiograph 06/26/2020 FINDINGS: There are increasing hazy opacities in the lungs. These partially obscure the costophrenic sulci though difficult to fully assess given collimation of the lung bases due to difficulty with patient positioning. Overall, appearance is increased from comparison prior and could reflect developing edema and or atelectasis with airspace disease less favored though not completely excluded. Pulmonary vascularity is slightly congested. Cardiomediastinal contours  may be accentuated by the portable technique. Transcatheter aortic valve replacement. Aortic atherosclerosis. No acute osseous or soft tissue abnormality. Degenerative changes are present in the imaged spine and shoulders. IMPRESSION: Increasing hazy basilar opacities and pulmonary vascular congestion may reflect some developing edema and or atelectasis in the lung bases though underlying airspace disease is difficult to fully exclude particularly in the setting of possible sepsis. Aortic Atherosclerosis (ICD10-I70.0). Suboptimal imaging given difficulties with patient positioning. Electronically Signed   By: Lovena Le M.D.   On: 12/29/2020 19:59   DG Foot Complete Left  Result Date: 12/29/2020 CLINICAL DATA:  Left leg pain and swelling, initial encounter EXAM: LEFT FOOT - COMPLETE 3+ VIEW COMPARISON:  04/19/2019, 11/18/2020 FINDINGS: Postsurgical changes are again noted in the medial malleolus stable in appearance from the prior exam. Tarsal degenerative changes are noted. Calcaneal spurring is seen. Flattening of the plantar arch is noted. Soft tissue ulcer is noted along the anterior aspect of the distal lower leg. No other ulcerations are seen. No acute fracture is noted. IMPRESSION: Chronic changes as described. Soft tissue wound is noted along the anterior aspect of the lower left leg. Electronically Signed   By: Inez Catalina M.D.   On: 12/29/2020 20:02   US Abdomen Limited RUQ (LIVER/GB)  Result Date: 12/30/2020 CLINICAL DATA:  70 year old male with history of elevated liver function tests. EXAM: ULTRASOUND ABDOMEN LIMITED RIGHT UPPER QUADRANT COMPARISON:  No priors. FINDINGS: Gallbladder: Gallbladder is moderately distended. No gallstones. Gallbladder wall appears mildly thickened measuring up to 6 mm in diameter. No pericholecystic fluid. Per report from the sonographer there was no sonographic Murphy's sign on examination. No sonographic Murphy sign noted by sonographer. Common bile duct:  Diameter: 4 mm in the porta hepatis. Liver: No focal lesion identified. Within normal limits in parenchymal echogenicity. Portal vein is patent on color Doppler imaging with normal direction of blood flow towards the liver. Other: Right pleural effusion. IMPRESSION: 1. No gallstones. 2. Gallbladder is moderately distended with mild diffuse gallbladder wall thickening measuring up to 6 mm. This is of uncertain etiology and significance, but can be seen in the setting of intrinsic liver disease. Given the lack of a sonographic Murphy's sign, cholecystitis is not favored. 3. No intra or extrahepatic biliary ductal dilatation to suggest biliary tract obstruction. 4. Right pleural effusion. Electronically Signed   By: Vinnie Langton M.D.   On: 12/30/2020 09:26    Assessment and Plan:   Atrial fibrillation with rapid ventricular rate: I am going to switch him to metoprolol and titrate this.  I see no contraindication to Eliquis which should be started if there is no planned surgical procedures or contraindication.  HTN:  He is on hydralazine and I will reduce and increase the beta blocker.  BP is running low.  Status post TAVR: Okay to discontinue Plavix.  Follow clinically.  He has some paravalvular leak which will be followed clinically.  Chronic diastolic heart failure: He does have a mildly elevated BNP.  Chest x-ray demonstrates some mild edema.  However, he is lying flat and urine is concentrated.  Continue to follow.   For questions or updates, please contact [place] Please consult [email or web address] for contact info under Cardiology/STEMI.   Signed, Minus Breeding, MD  01/01/2021 4:59 PM

## 2021-01-01 NOTE — TOC Initial Note (Addendum)
Transition of Care [facility]) - Initial/Assessment Note    Patient Details  Name: Jordan [name] MRN: [id number] Date of Birth: 10-Feb-1951  [facility] of [facility]) CM/SW Contact:    Trish Mage, LCSW Phone Number: 01/01/2021, 2:28 PM  Clinical Narrative:   Patient seen in follow up to PT recommendation of SNF.  Jordan [name] lives alone, limited somewhat in ambulation already due to needing r knee replacement, has been to SNF in past and is open to going again.  Is hoping that he can get into [facility] as he and family live not far from facility. He has all needed DME and has personal care services 7 days a week, 6 hours per day.  Bed search initiated. TOC will continue to follow during the course of hospitalization.  Addendum: Insurance authorization initiated.  Reference #[id number]                 Expected Discharge Plan: Skilled Nursing Facility Barriers to Discharge: SNF Pending bed offer   Patient Goals and CMS Choice     Choice offered to / list presented to : Patient  Expected Discharge Plan and Services Expected Discharge Plan: [place]   Discharge Planning Services: CM Consult Post Acute Care Choice: [place] Living arrangements for the past 2 months: Single Family Home                                      Prior Living Arrangements/Services Living arrangements for the past 2 months: Single Family Home Lives with:: Self Patient language and need for interpreter reviewed:: Yes        Need for Family Participation in Patient Care: Yes (Comment) Care giver support system in place?: Yes (comment) Current home services: DME Criminal Activity/Legal Involvement Pertinent to Current Situation/Hospitalization: No - Comment as needed  Activities of Daily Living Home Assistive Devices/Equipment: CPAP, Eyeglasses, Nebulizer, CBG Meter, Cane (specify quad or straight) (single point cane) ADL Screening (condition at time of  admission) Patient's cognitive ability adequate to safely complete daily activities?: Yes Is the patient deaf or have difficulty hearing?: No Does the patient have difficulty seeing, even when wearing glasses/contacts?: No Does the patient have difficulty concentrating, remembering, or making decisions?: No Patient able to express need for assistance with ADLs?: Yes Does the patient have difficulty dressing or bathing?: No Independently performs ADLs?: Yes (appropriate for developmental age) Does the patient have difficulty walking or climbing stairs?: Yes Weakness of Legs: Both Weakness of Arms/Hands: None  Permission Sought/Granted                  Emotional Assessment Appearance:: Appears stated age Attitude/Demeanor/Rapport: Engaged   Orientation: : Oriented to Self, Oriented to Place, Oriented to  Time, Oriented to Situation Alcohol / Substance Use: Not Applicable Psych Involvement: No (comment)  Admission diagnosis:  Peripheral edema [R60.9] Cellulitis of left lower extremity [L03.116] Left leg cellulitis [L03.116] LFT elevation [R79.89] Anemia, unspecified type [D64.9] Patient Active Problem List   Diagnosis Date Noted   Open wound of left foot 12/30/2020   Macrocytic anemia 12/30/2020   Arthritis 12/30/2020   (HFpEF) heart failure with preserved ejection fraction ([place]) 12/30/2020   Left leg cellulitis 12/29/2020   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   Diabetes mellitus without complication (HCC)    COPD (chronic obstructive pulmonary disease) (HCC)    CAD (coronary artery disease)  Severe aortic stenosis    GERD (gastroesophageal reflux disease)    Protein-calorie malnutrition, severe 03/05/2020   Tobacco abuse 03/03/2020   Pressure injury of skin 03/15/2017   Chronic venous insufficiency 04/28/2014   PAD (peripheral artery disease) ([place]) 01/21/2014   Varicose veins of lower extremities with other complications 95/12/2255   Hx of  TIA (transient ischemic attack) and stroke 12/31/2013   HLD (hyperlipidemia) 12/31/2013   Severe obesity (BMI >= 40) ([place]) 12/31/2013   Essential hypertension, benign 09/13/2012   Edema 09/13/2012   PCP:  Jordan Bowen, MD Pharmacy:   [id number] [address], [place] [address] Phone: [phone] Fax: [phone]     Social Determinants of Health (SDOH) Interventions    Readmission Risk Interventions No flowsheet data found.

## 2021-01-01 NOTE — NC FL2 (Signed)
[place] LEVEL OF CARE SCREENING TOOL     IDENTIFICATION  Patient Name: Jordan [name] Birthdate: 1951-04-29 Sex: male Admission Date (Current Location): 12/29/2020  [facility] and [place] Number:  Herbalist and Address:  [facility],  [place] [address], [place]      Provider Number:    Attending Physician Name and Address:  Mckinley Jewel, MD  Relative Name and Phone Number:  Marvie, Calender [phone]   [phone]    Current Level of Care: Hospital Recommended Level of Care: [place] Prior Approval Number:    Date Approved/Denied:   PASRR Number: [id number]  Discharge Plan: SNF    Current Diagnoses: Patient Active Problem List   Diagnosis Date Noted   Open wound of left foot 12/30/2020   Macrocytic anemia 12/30/2020   Arthritis 12/30/2020   (HFpEF) heart failure with preserved ejection fraction ([place]) 12/30/2020   Left leg cellulitis 12/29/2020   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   Diabetes mellitus without complication (HCC)    COPD (chronic obstructive pulmonary disease) ([place])    CAD (coronary artery disease)    Severe aortic stenosis    GERD (gastroesophageal reflux disease)    Protein-calorie malnutrition, severe 03/05/2020   Tobacco abuse 03/03/2020   Pressure injury of skin 03/15/2017   Chronic venous insufficiency 04/28/2014   PAD (peripheral artery disease) ([place]) 01/21/2014   Varicose veins of lower extremities with other complications 96/22/2979   Hx of TIA (transient ischemic attack) and stroke 12/31/2013   HLD (hyperlipidemia) 12/31/2013   Severe obesity (BMI >= 40) ([place]) 12/31/2013   Essential hypertension, benign 09/13/2012   Edema 09/13/2012    Orientation RESPIRATION BLADDER Height & Weight     Self, Time, Situation, Place  O2 (2L ) External catheter Weight: 102.1 kg Height:  6' (182.9 cm)  BEHAVIORAL SYMPTOMS/MOOD NEUROLOGICAL BOWEL  NUTRITION STATUS   (none)  (none) Incontinent Diet (see d/c summary)  AMBULATORY STATUS COMMUNICATION OF NEEDS Skin   Extensive Assist Verbally Other (Comment) (Pressure Injury, Left ankle, anterior; Wound, Left Foot)                       Personal Care Assistance Level of Assistance  Bathing, Feeding, Dressing Bathing Assistance: Maximum assistance Feeding assistance: Independent Dressing Assistance: Limited assistance     Functional Limitations Info  Sight, Hearing, Speech Sight Info: Adequate Hearing Info: Adequate Speech Info: Adequate    SPECIAL CARE FACTORS FREQUENCY  PT (By licensed PT), OT (By licensed OT)     PT Frequency: 5X/W OT Frequency: 5X/W            Contractures Contractures Info: Not present    Additional Factors Info  Code Status, Allergies Code Status Info: full Allergies Info: Ramipril, Codeine, Naproxen           Current Medications (01/01/2021):  This is the current hospital active medication list Current Facility-Administered Medications  Medication Dose Route Frequency Provider Last Rate Last Admin   acetaminophen (TYLENOL) tablet 650 mg  650 mg Oral Q6H PRN Adefeso, Oladapo, DO   650 mg at 12/30/20 0316   aspirin chewable tablet 81 mg  81 mg Oral Daily Adefeso, Oladapo, DO   81 mg at 01/01/21 0859   atenolol (TENORMIN) tablet 25 mg  25 mg Oral Daily Dessa Phi, DO   25 mg at 01/01/21 0900   cefTRIAXone (ROCEPHIN) 2 g in sodium chloride 0.9 % 100 mL IVPB  2 g Intravenous Q24H Adefeso, Oladapo, DO 200 mL/hr at 12/31/20 2244 2 g at 12/31/20 2244   clopidogrel (PLAVIX) tablet 75 mg  75 mg Oral Q breakfast Adefeso, Oladapo, DO   75 mg at 01/01/21 0900   collagenase (SANTYL) ointment   Topical Daily Dessa Phi, DO   Given at 12/30/20 1828   enoxaparin (LOVENOX) injection 40 mg  40 mg Subcutaneous Q24H Adefeso, Oladapo, DO   40 mg at 94/58/59 2924   folic acid (FOLVITE) tablet 1 mg  1 mg Oral Daily Adefeso, Oladapo, DO   1 mg at  01/01/21 0859   hydrALAZINE (APRESOLINE) tablet 75 mg  75 mg Oral TID Dessa Phi, DO   75 mg at 01/01/21 0859   ipratropium-albuterol (DUONEB) 0.5-2.5 (3) MG/3ML nebulizer solution 3 mL  3 mL Nebulization Q6H PRN Adefeso, Oladapo, DO   3 mL at 12/31/20 1343   ipratropium-albuterol (DUONEB) 0.5-2.5 (3) MG/3ML nebulizer solution 3 mL  3 mL Nebulization TID Dessa Phi, DO   3 mL at 01/01/21 0754   LORazepam (ATIVAN) tablet 1 mg  1 mg Oral BID PRN Pahwani, Rinka R, MD   1 mg at 01/01/21 1009   methocarbamol (ROBAXIN) tablet 750 mg  750 mg Oral PRN Dessa Phi, DO       oxyCODONE-acetaminophen (PERCOCET/ROXICET) 5-325 MG per tablet 1 tablet  1 tablet Oral Q4H PRN Dessa Phi, DO   1 tablet at 01/01/21 1139   pantoprazole (PROTONIX) EC tablet 40 mg  40 mg Oral Daily Adefeso, Oladapo, DO   40 mg at 01/01/21 0859   promethazine (PHENERGAN) tablet 25 mg  25 mg Oral Q8H PRN Dessa Phi, DO       tamsulosin (FLOMAX) capsule 0.4 mg  0.4 mg Oral Daily Dessa Phi, DO       venlafaxine XR (EFFEXOR-XR) 24 hr capsule 75 mg  75 mg Oral Q breakfast Dessa Phi, DO   75 mg at 01/01/21 0900     Discharge Medications: Please see discharge summary for a list of discharge medications.  Relevant Imaging Results:  Relevant Lab Results:   Additional Information SS#: [id number]  Trish Mage, LCSW

## 2021-01-02 ENCOUNTER — Inpatient Hospital Stay (HOSPITAL_COMMUNITY): Payer: Medicare PPO

## 2021-01-02 DIAGNOSIS — I10 Essential (primary) hypertension: Secondary | ICD-10-CM | POA: Diagnosis not present

## 2021-01-02 DIAGNOSIS — L03116 Cellulitis of left lower limb: Secondary | ICD-10-CM | POA: Diagnosis not present

## 2021-01-02 MED ORDER — PREDNISONE 50 MG PO TABS
50.0000 mg | ORAL_TABLET | Freq: Every day | ORAL | Status: DC
  Administered 2021-01-02 – 2021-01-05 (×4): 50 mg via ORAL
  Filled 2021-01-02 (×3): qty 1

## 2021-01-02 MED ORDER — IPRATROPIUM-ALBUTEROL 0.5-2.5 (3) MG/3ML IN SOLN: 3.0000 mL | RESPIRATORY_TRACT | Status: DC | PRN

## 2021-01-02 MED ORDER — COVID-19 MRNA VAC-TRIS(PFIZER) 30 MCG/0.3ML IM SUSP
0.3000 mL | Freq: Once | INTRAMUSCULAR | Status: DC
  Filled 2021-01-02: qty 0.3

## 2021-01-02 MED ORDER — FUROSEMIDE 10 MG/ML IJ SOLN
40.0000 mg | Freq: Once | INTRAMUSCULAR | Status: AC
  Administered 2021-01-02: 40 mg via INTRAVENOUS
  Filled 2021-01-02: qty 4

## 2021-01-02 NOTE — Progress Notes (Signed)
Nursing alerted me to hearing rales. I saw the patient. He does report being more tired since he saw me this AM. Breathing is a little labered. He is on O2 prn at home. Reports breathing tx'd help. He has hx of COPD.  CXR unofficially shows edema. Will give one dose of IV lasix. He is not on IVF's. On exam I did hear faint wheezing. Will increase prn freq of Duoneb tx's and also start a 5 d course of prednisone 50 mg/d. Hopefully home tomorrow.

## 2021-01-02 NOTE — Progress Notes (Signed)
Progress Note  Patient Name: Jordan [name] Date of Encounter: 01/02/2021  [facility] Cardiologist: Candee Furbish, MD   Subjective   Overall feeling much better than he did on admission.  No chest pain, no shortness of breath.  Laying flat and comfortable in bed.  Very pleasant.  Thankful.  Inpatient Medications    Scheduled Meds:  apixaban  5 mg Oral BID   aspirin  81 mg Oral Daily   collagenase   Topical Daily   folic acid  1 mg Oral Daily   hydrALAZINE  25 mg Oral TID   ipratropium-albuterol  3 mL Nebulization BID   metoprolol tartrate  50 mg Oral BID   pantoprazole  40 mg Oral Daily   tamsulosin  0.4 mg Oral Daily   venlafaxine XR  75 mg Oral Q breakfast   Continuous Infusions:  cefTRIAXone (ROCEPHIN)  IV 2 g (01/01/21 2126)   PRN Meds: acetaminophen, ipratropium-albuterol, LORazepam, methocarbamol, oxyCODONE-acetaminophen, promethazine   Vital Signs    Vitals:   01/01/21 1711 01/01/21 2111 01/02/21 0444 01/02/21 0747  BP: 121/65 131/62 129/75   Pulse: 90 84 (!) 103   Resp: 20 20 18    Temp: 97.8 F (36.6 C) 98.1 F (36.7 C) 98 F (36.7 C)   TempSrc: Oral     SpO2: 99% 98% 96% 93%  Weight:      Height:        Intake/Output Summary (Last 24 hours) at 01/02/2021 0749 Last data filed at 01/02/2021 0448 Gross per 24 hour  Intake 708 ml  Output 1325 ml  Net -617 ml   Last 3 Weights 12/29/2020 12/27/2020 07/23/2020  Weight (lbs) 225 lb 184 lb 15.5 oz 185 lb  Weight (kg) 102.059 kg 83.9 kg 83.915 kg      Telemetry    Heart rate has improved with metoprolol, most recently in the mid to upper 90s.- Personally Reviewed  ECG    01/01/2021 personally reviewed-atrial fibrillation heart rate 96 bpm with nonspecific ST-T wave changes- Personally Reviewed  Physical Exam   GEN: No acute distress.   Neck: No JVD Cardiac: Irregularly irregular, soft systolic murmur, rubs, or gallops.  Respiratory: Clear to auscultation bilaterally. GI: Soft, nontender,  non-distended  MS: Left lower extremity cellulitis noted; No deformity. Neuro:  Nonfocal  Psych: Normal affect   Labs    High Sensitivity Troponin:  No results for input(s): TROPONINIHS in the last 720 hours.    Chemistry Recent Labs  Lab 12/29/20 1930 12/30/20 0412 12/31/20 0444 01/01/21 0436  NA 133* 135 138 136  K 4.3 4.4 4.5 4.7  CL 102 103 105 104  CO2 24 25 28 25   GLUCOSE 94 86 84 80  BUN 23 20 22 21   CREATININE 1.38* 1.13 1.27* 1.11  CALCIUM 8.4* 8.2* 8.0* 7.9*  PROT 5.9* 5.4* 5.2*  --   ALBUMIN 3.1* 2.8* 2.6*  --   AST 45* 39 31  --   ALT 22 20 17   --   ALKPHOS 110 95 88  --   BILITOT 0.8 1.0 0.6  --   GFRNONAA 55* >60 >60 >60  ANIONGAP 7 7 5 7      Hematology Recent Labs  Lab 12/30/20 0412 12/30/20 0534 12/31/20 0444 01/01/21 0436  WBC 6.6  --  6.9 7.8  RBC 2.45*  --  2.39* 2.54*  HGB 7.4* 7.3* 7.4* 7.8*  HCT 24.2* 24.1* 24.1* 26.2*  MCV 98.8  --  100.8* 103.1*  MCH 30.2  --  31.0 30.7  MCHC 30.6  --  30.7 29.8*  RDW 18.4*  --  18.5* 18.3*  PLT 266  --  229 171    BNP Recent Labs  Lab 12/30/20 0412  BNP 805.0*     DDimer No results for input(s): DDIMER in the last 168 hours.   Radiology    No results found.  Cardiac Studies   Echocardiogram 07/23/2020 - EF 60 to 65% with grade 2 diastolic dysfunction and moderately dilated left atrial size as well as right atrial size.  Trivial MR.  Perivalvular leak in the 26 mm SAPIEN TAVR valve.  This was placed on 06/30/2020.  Mean gradient is 14.6 mmHg.  Patient Profile     70 y.o. male with atrial fibrillation rapid ventricular response, status post TAVR, essential hypertension, chronic diastolic heart failure.  Atrial fibrillation is new diagnosis.  Assessment & Plan    Paroxysmal atrial fibrillation - Was switched over to metoprolol for optimization of rate control.  He is on metoprolol 50 mg twice daily.  Chronic anticoagulation - Eliquis started this admission 5 mg p.o. twice daily.   Continue.  Watch for any signs of bleeding.  Nonobstructive coronary artery disease - Seen on cardiac catheterization 03/06/2020 -Resume statin on discharge, was on atorvastatin.  This was held because of mildly elevated AST on admission.  Anemia - Normal MCV to slightly elevated.  Hemoglobin 7.3 on 12/30/2020.  Work-up per primary team.  Watch with anticoagulation.  He had a colonoscopy 3 years ago.  Dr. Carrolyn Meiers is his primary physician.  Recently got a B12 shot.  TAVR valve - On aspirin.  Discontinued Plavix.  Minimal perivalvular leak.  Chronic diastolic heart failure - Mildly elevated BNP on labs.  Chest x-ray with mild edema.  Seems as though he is comfortable clinically however laying flat with concentrated urine.  Continue to follow.    Lower extremity cellulitis - Per primary team.  Chronic wound care.  For questions or updates, please contact [place] Please consult [email or web address] for contact info under        Signed, Candee Furbish, MD  01/02/2021, 7:49 AM

## 2021-01-02 NOTE — TOC Progression Note (Signed)
Transition of Care [facility]) - Progression Note    Patient Details  Name: Jordan [name] MRN: [id number] Date of Birth: November 28, 1950  Transition of Care [facility]) CM/SW [place], LCSW Phone Number:[phone] 01/02/2021, 1:28 PM  Clinical Narrative:    CSW confirmed Auth: [id number] for 5 day stay.  CSW informed pt of only bed offer today with [facility], pt reported not wanting to d/c today due to knee pain and wanting to speak with family about bed offer. TOC continue to follow.    Expected Discharge Plan: Skilled Nursing Facility Barriers to Discharge: SNF Pending bed offer  Expected Discharge Plan and Services Expected Discharge Plan: [place]   Discharge Planning Services: CM Consult Post Acute Care Choice: [place] Living arrangements for the past 2 months: Single Family Home                                       Social Determinants of Health (SDOH) Interventions    Readmission Risk Interventions No flowsheet data found.

## 2021-01-02 NOTE — Progress Notes (Signed)
PROGRESS NOTE    BERNIE RANSFORD  KDT:[id number] DOB: July 03, 1950 DOA: 12/29/2020 PCP: [name], MD     Brief Narrative:  Jordan [name] is a 70 y.o. male with medical history significant for s/p TAVR (06/30/2020), HFpEF (LVEF 60 to 99%) with LV diastolic parameters consistent with grade 2 diastolic dysfunction, hypertension, hyperlipidemia, chronic venous insufficiency with bilateral lower extremity edema and history of venous stasis ulcers, ongoing tobacco abuse with COPD and arthritis who presents to the emergency department due to left leg swelling, redness and painful.  Patient follows with Dr. Sharol Given (orthopedics) due to worsening right knee arthritis, he had a fall over the weekend and he was seen in the ED on July 10 and was noted to have a right knee contusion with open wound left leg wound and was discharged home to continue with home medications.  He went to his PCPs office 7/12 and EMS was activated for patient to be taken to the ED. Left foot x-ray showed chronic changes as described. Soft tissue wound is noted along the anterior aspect of the lower left leg. Patient was treated with Unasyn due to presumed cellulitis.   New events last 24 hours / Subjective: Feeling good. Ready to go home.  Nursing notified me later he was feeling more fatigued. Awaiting SNF placement.  Assessment & Plan:   Principal Problem:   Left leg cellulitis  Rocephin day 4  Sepsis ruled out  Wound care team appreciated  Active Problems:   Atrial fibrillation  New onset  Appreciate cardiology  Eliquis 5 mg twice daily  Metoprolol 50 mg twice daily    Essential hypertension, benign  Continue Tenormin and hydralazine   HLD (hyperlipidemia)    Chronic venous insufficiency    COPD (chronic obstructive pulmonary disease) (HCC)  Duo nebs as needed   Open wound of left foot  Appreciate wound care team    Macrocytic anemia  Monitor CBC    Arthritis    (HFpEF) heart failure with  preserved ejection fraction ([place])    In agreement with assessment of the pressure ulcer as below:  Pressure Injury 12/30/20 Ankle Anterior;Left Unstageable - Full thickness tissue loss in which the base of the injury is covered by slough (yellow, tan, gray, green or brown) and/or eschar (tan, brown or black) in the wound bed. MDRPI from Unna's boot (Active)  12/30/20 1422  Location: Ankle  Location Orientation: Anterior;Left  Staging: Unstageable - Full thickness tissue loss in which the base of the injury is covered by slough (yellow, tan, gray, green or brown) and/or eschar (tan, brown or black) in the wound bed.  Wound Description (Comments): MDRPI from Unna's boot  Present on Admission: Yes     Pressure Injury 01/01/21 Coccyx Mid tearing also present (Active)  01/01/21 1000  Location: Coccyx  Location Orientation: Mid  Staging:   Wound Description (Comments): tearing also present  Present on Admission:          DVT prophylaxis: Eliquis Code Status: Full Family Communication: Self Coming From: Home Disposition Plan: SNF Barriers to Discharge: Medical improvement with  Consultants:  Cardiology  Procedures:  None  Antimicrobials:  Anti-infectives (From admission, onward)    Start     Dose/Rate Route Frequency Ordered Stop   12/30/20 0330  cefTRIAXone (ROCEPHIN) 2 g in sodium chloride 0.9 % 100 mL IVPB        2 g 200 mL/hr over 30 Minutes Intravenous Every 24 hours 12/30/20 0328     12/29/20  2230  Ampicillin-Sulbactam (UNASYN) 3 g in sodium chloride 0.9 % 100 mL IVPB        3 g 200 mL/hr over 30 Minutes Intravenous  Once 12/29/20 2223 12/29/20 2326        Objective: Vitals:   01/02/21 0444 01/02/21 0747 01/02/21 0749 01/02/21 1008  BP: 129/75   (!) 119/51  Pulse: (!) 103   76  Resp: 18     Temp: 98 F (36.7 C)     TempSrc:      SpO2: 96% 93% 93%   Weight:      Height:        Intake/Output Summary (Last 24 hours) at 01/02/2021 1311 Last data filed at  01/02/2021 0900 Gross per 24 hour  Intake 712 ml  Output 1325 ml  Net -613 ml   Filed Weights   12/29/20 2021  Weight: 102.1 kg    Examination:  General exam: Appears calm and comfortable  Respiratory system: Clear to auscultation. Respiratory effort normal. No respiratory distress. No conversational dyspnea.  Cardiovascular system: S1 & S2 heard, rate is normal, irregularly irregular rhythm. No pedal edema. Gastrointestinal system: Abdomen is nondistended, soft and nontender. Normal bowel sounds heard. Central nervous system: Alert and oriented. No focal neurological deficits. Speech clear.  Extremities: Symmetric in appearance  Skin: B/l LE wrapped; no is no tenderness with palpation Psychiatry: Judgement and insight appear normal. Mood & affect appropriate.   Data Reviewed: I have personally reviewed following labs and imaging studies  CBC: Recent Labs  Lab 12/29/20 1930 12/30/20 0412 12/30/20 0534 12/31/20 0444 01/01/21 0436  WBC 7.5 6.6  --  6.9 7.8  NEUTROABS 6.1  --   --   --   --   HGB 8.4* 7.4* 7.3* 7.4* 7.8*  HCT 27.2* 24.2* 24.1* 24.1* 26.2*  MCV 100.7* 98.8  --  100.8* 103.1*  PLT 247 266  --  229 621   Basic Metabolic Panel: Recent Labs  Lab 12/29/20 1930 12/30/20 0412 12/31/20 0444 01/01/21 0436  NA 133* 135 138 136  K 4.3 4.4 4.5 4.7  CL 102 103 105 104  CO2 24 25 28 25   GLUCOSE 94 86 84 80  BUN 23 20 22 21   CREATININE 1.38* 1.13 1.27* 1.11  CALCIUM 8.4* 8.2* 8.0* 7.9*  MG  --  2.0  --  1.9  PHOS  --  3.6  --   --    GFR: Estimated Creatinine Clearance: 76.6 mL/min (by C-G formula based on SCr of 1.11 mg/dL). Liver Function Tests: Recent Labs  Lab 12/29/20 1930 12/30/20 0412 12/31/20 0444  AST 45* 39 31  ALT 22 20 17   ALKPHOS 110 95 88  BILITOT 0.8 1.0 0.6  PROT 5.9* 5.4* 5.2*  ALBUMIN 3.1* 2.8* 2.6*   Coagulation Profile: Recent Labs  Lab 12/29/20 1930 12/30/20 0412  INR 1.0 1.1    Sepsis Labs: Recent Labs  Lab  12/29/20 1829  LATICACIDVEN 1.3    Recent Results (from the past 240 hour(s))  Blood Culture (routine x 2)     Status: None (Preliminary result)   Collection Time: 12/29/20  6:35 PM   Specimen: BLOOD  Result Value Ref Range Status   Specimen Description   Final    BLOOD RIGHT ANTECUBITAL Performed at [facility], [place] [address] [place], [place] [address]    Special Requests   Final    BOTTLES DRAWN AEROBIC AND ANAEROBIC Blood Culture adequate volume Performed at [facility], [place] [address] [place], [place] [address]    Culture   Final    NO GROWTH 3 DAYS Performed at [facility], [place] [address], [place], [place] [address]    Report Status PENDING  Incomplete  Blood Culture (routine x 2)     Status: None (Preliminary result)   Collection Time: 12/29/20  6:41 PM   Specimen: BLOOD  Result Value Ref Range Status   Specimen Description   Final    BLOOD LEFT ANTECUBITAL Performed at [place] [address] [place], [place] [address]    Special Requests   Final    BOTTLES DRAWN AEROBIC AND ANAEROBIC Blood Culture adequate volume Performed at [place] [address], [place], [place] [address]    Culture   Final    NO GROWTH 3 DAYS Performed at [facility], [place] [address] [place], [place] [address]    Report Status PENDING  Incomplete  Urine culture     Status: Abnormal   Collection Time: 12/29/20  8:22 PM   Specimen: In/Out Cath Urine  Result Value Ref Range Status   Specimen Description   Final    IN/OUT CATH URINE Performed at [place] [address] [place], [place] [address]    Special Requests   Final    NONE Performed at [facility], [place] [address], [place], [place] [address]    Culture MULTIPLE SPECIES PRESENT, SUGGEST RECOLLECTION (A)  Final   Report Status 12/31/2020 FINAL  Final  Resp Panel by RT-PCR (Flu A&B,  Covid) Nasopharyngeal Swab     Status: None   Collection Time: 12/29/20 10:50 PM   Specimen: Nasopharyngeal Swab; Nasopharyngeal(NP) swabs in vial transport medium  Result Value Ref Range Status   SARS Coronavirus 2 by RT PCR NEGATIVE NEGATIVE Final    Comment: (NOTE) SARS-CoV-2 target nucleic acids are NOT DETECTED.  The SARS-CoV-2 RNA is generally detectable in upper respiratory specimens during the acute phase of infection. The lowest concentration of SARS-CoV-2 viral copies this assay can detect is 138 copies/mL. A negative result does not preclude SARS-Cov-2 infection and should not be used as the sole basis for treatment or other patient management decisions. A negative result may occur with  improper specimen collection/handling, submission of specimen other than nasopharyngeal swab, presence of viral mutation(s) within the areas targeted by this assay, and inadequate number of viral copies(<138 copies/mL). A negative result must be combined with clinical observations, patient history, and epidemiological information. The expected result is Negative.  Fact Sheet for Patients:  EntrepreneurPulse.com.au  Fact Sheet for Healthcare Providers:  IncredibleEmployment.be  This test is no t yet approved or cleared by the [place] FDA and  has been authorized for detection and/or diagnosis of SARS-CoV-2 by FDA under an Emergency Use Authorization (EUA). This EUA will remain  in effect (meaning this test can be used) for the duration of the COVID-19 declaration under Section 564(b)(1) of the Act, 21 U.S.C.section 360bbb-3(b)(1), unless the authorization is terminated  or revoked sooner.       Influenza A by PCR NEGATIVE NEGATIVE Final   Influenza B by PCR NEGATIVE NEGATIVE Final    Comment: (NOTE) The Xpert Xpress SARS-CoV-2/FLU/RSV plus assay is intended as an aid in the diagnosis of influenza from Nasopharyngeal swab specimens and should  not be used as a sole basis for treatment. Nasal washings and aspirates are unacceptable for Xpert Xpress SARS-CoV-2/FLU/RSV testing.  Fact Sheet for Patients: EntrepreneurPulse.com.au  Fact Sheet for Healthcare Providers: IncredibleEmployment.be  This test is not yet approved or cleared by the [place] FDA and has been authorized for detection and/or diagnosis of SARS-CoV-2 by FDA under an Emergency Use Authorization (EUA). This EUA will remain in effect (meaning this test can be used) for the duration of the COVID-19 declaration under Section 564(b)(1) of the Act, 21 U.S.C. section 360bbb-3(b)(1), unless the authorization is terminated or revoked.  Performed at [facility], [place] [address], [place], [place] [address]       Radiology Studies: No results found.   Scheduled Meds:  apixaban  5 mg Oral BID   aspirin  81 mg Oral Daily   collagenase   Topical Daily   COVID-19 mRNA Vac-TriS (Pf[employer])  0.3 mL Intramuscular Once   folic acid  1 mg Oral Daily   hydrALAZINE  25 mg Oral TID   ipratropium-albuterol  3 mL Nebulization BID   metoprolol tartrate  50 mg Oral BID   pantoprazole  40 mg Oral Daily   tamsulosin  0.4 mg Oral Daily   venlafaxine XR  75 mg Oral Q breakfast   Continuous Infusions:  cefTRIAXone (ROCEPHIN)  IV 2 g (01/01/21 2126)     LOS: 4 days    Time spent: 30 minutes   Shelda Pal, DO [facility] 01/02/2021, 1:11 PM   Available via Epic secure chat 7am-7pm After these hours, please refer to coverage provider listed on [email or web address]

## 2021-01-03 DIAGNOSIS — I10 Essential (primary) hypertension: Secondary | ICD-10-CM | POA: Diagnosis not present

## 2021-01-03 DIAGNOSIS — I5030 Unspecified diastolic (congestive) heart failure: Secondary | ICD-10-CM | POA: Diagnosis not present

## 2021-01-03 DIAGNOSIS — I4891 Unspecified atrial fibrillation: Secondary | ICD-10-CM | POA: Diagnosis present

## 2021-01-03 DIAGNOSIS — L03116 Cellulitis of left lower limb: Secondary | ICD-10-CM | POA: Diagnosis not present

## 2021-01-03 DIAGNOSIS — I4819 Other persistent atrial fibrillation: Secondary | ICD-10-CM | POA: Diagnosis present

## 2021-01-03 LAB — BASIC METABOLIC PANEL
Anion gap: 8 (ref 5–15)
BUN: 29 mg/dL — ABNORMAL HIGH (ref 8–23)
CO2: 32 mmol/L (ref 22–32)
Calcium: 8.5 mg/dL — ABNORMAL LOW (ref 8.9–10.3)
Chloride: 94 mmol/L — ABNORMAL LOW (ref 98–111)
Creatinine, Ser: 1.14 mg/dL (ref 0.61–1.24)
GFR, Estimated: >60 mL/min (ref >60)
Glucose, Bld: 120 mg/dL — ABNORMAL HIGH (ref 70–99)
Potassium: 4.9 mmol/L (ref 3.5–5.1)
Sodium: 134 mmol/L — ABNORMAL LOW (ref 135–145)

## 2021-01-03 LAB — CBC
HCT: 29.1 % — ABNORMAL LOW (ref 39.0–52.0)
Hemoglobin: 9 g/dL — ABNORMAL LOW (ref 13.0–17.0)
MCH: 30.8 pg (ref 26.0–34.0)
MCHC: 30.9 g/dL (ref 30.0–36.0)
MCV: 99.7 fL (ref 80.0–100.0)
Platelets: 256 10*3/uL (ref 150–400)
RBC: 2.92 MIL/uL — ABNORMAL LOW (ref 4.22–5.81)
RDW: 16.7 % — ABNORMAL HIGH (ref 11.5–15.5)
WBC: 6.2 10*3/uL (ref 4.0–10.5)
nRBC: 0 % (ref 0.0–0.2)

## 2021-01-03 LAB — CULTURE, BLOOD (ROUTINE X 2)
Culture: NO GROWTH
Culture: NO GROWTH
Special Requests: ADEQUATE
Special Requests: ADEQUATE

## 2021-01-03 MED ORDER — SODIUM CHLORIDE 0.9 % IV BOLUS
250.0000 mL | Freq: Once | INTRAVENOUS | Status: AC
  Administered 2021-01-03: 250 mL via INTRAVENOUS

## 2021-01-03 MED ORDER — FUROSEMIDE 40 MG PO TABS
40.0000 mg | ORAL_TABLET | Freq: Every day | ORAL | Status: DC
  Administered 2021-01-04 – 2021-01-05 (×2): 40 mg via ORAL
  Filled 2021-01-03 (×3): qty 1

## 2021-01-03 NOTE — Progress Notes (Signed)
PROGRESS NOTE    [name]  DDU:[id number] DOB: 1951-03-16 DOA: 12/29/2020 PCP: [name], MD     Brief Narrative:  Jordan [name] is a 70 y.o. 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  Patient follows with Dr. Sharol Given (orthopedics) due to worsening right knee arthritis, he had a fall over the weekend and he was seen in the ED on July 10 and was noted to have a right knee contusion with open wound left leg wound and was discharged home to continue with home medications.  He went to his PCPs office 7/12 and EMS was activated for patient to be taken to the ED. Left foot x-ray showed chronic changes as described. Soft tissue wound is noted along the anterior aspect of the lower left leg. Patient was treated with Unasyn due to presumed cellulitis.   New events last 24 hours / Subjective: Yesterday afternoon the patient started having more shortness of breath and fatigue.  Chest x-ray showed pulmonary edema.  He received 1 dose of IV Lasix and diuresed around 2.5 L.  He was also started on a 5-day prednisone burst as he does report breathing improved with breathing treatments.  Reports breathing is better as well as his energy.  Later in the morning his blood pressure was 82/44.  He was given a 250 mL bolus and it came up to 92/48.  Oral Lasix was held.  Of note, he is no longer having any pain of his lower extremities.  PT has recommended SNF and he would prefer [facility] or [facility].  Assessment & Plan:   Principal Problem:   Left leg cellulitis  Rocephin 1 g daily day 5  No evidence of sepsis  Wound care  team  Hold fluids for now  Blood cultures show no growth to date at day 4  Urine culture contaminated  Oxycodone as needed  Active Problems:   Pulmonary edema  S/p Lasix; I think we over diuresed him with 1 dose    Atrial fibrillation             New onset             Appreciate cardiology             Eliquis 5 mg twice daily             Metoprolol 50 mg twice daily     Essential hypertension, benign             Continue hydralazine    HLD (hyperlipidemia)     Chronic venous insufficiency     COPD (chronic obstructive pulmonary disease) (HCC)             Duo nebs as needed  Prednisone 50 mg/d, day 2 today    Open wound of left foot             Appreciate wound care team     Macrocytic anemia             Monitor CBC     BPH  Flomax 0.4 mg daily    GERD  Protonix 40 mg daily     (HFpEF) heart failure with  preserved ejection fraction ([place])  In agreement with assessment of the pressure ulcer as below:  Pressure Injury 12/30/20 Ankle Anterior;Left Unstageable - Full thickness tissue loss in which the base of the injury is covered by slough (yellow, tan, gray, green or brown) and/or eschar (tan, brown or black) in the wound bed. MDRPI from Unna's boot (Active)  12/30/20 1422  Location: Ankle  Location Orientation: Anterior;Left  Staging: Unstageable - Full thickness tissue loss in which the base of the injury is covered by slough (yellow, tan, gray, green or brown) and/or eschar (tan, brown or black) in the wound bed.  Wound Description (Comments): MDRPI from Unna's boot  Present on Admission: Yes     Pressure Injury 01/01/21 Coccyx Mid tearing also present (Active)  01/01/21 1000  Location: Coccyx  Location Orientation: Mid  Staging:   Wound Description (Comments): tearing also present  Present on Admission:    DVT prophylaxis: Eliquis Code Status: Full Family Communication: Self Coming From: Home Disposition Plan: SNF Barriers to Discharge: Medical  improvement with   Consultants:  Cardiology   Procedures:  None  Antimicrobials:  Anti-infectives (From admission, onward)    Start     Dose/Rate Route Frequency Ordered Stop   12/30/20 0330  cefTRIAXone (ROCEPHIN) 2 g in sodium chloride 0.9 % 100 mL IVPB        2 g 200 mL/hr over 30 Minutes Intravenous Every 24 hours 12/30/20 0328     12/29/20 2230  Ampicillin-Sulbactam (UNASYN) 3 g in sodium chloride 0.9 % 100 mL IVPB        3 g 200 mL/hr over 30 Minutes Intravenous  Once 12/29/20 2223 12/29/20 2326        Objective: Vitals:   01/03/21 0818 01/03/21 0900 01/03/21 1200 01/03/21 1351  BP:  (!) 82/44 (!) 92/48 (!) 96/49  Pulse:  75    Resp:      Temp:    97.8 F (36.6 C)  TempSrc:    Oral  SpO2: 95% 93%  96%  Weight:      Height:        Intake/Output Summary (Last 24 hours) at 01/03/2021 1448 Last data filed at 01/03/2021 0900 Gross per 24 hour  Intake 838 ml  Output 3325 ml  Net -2487 ml   Filed Weights   12/29/20 2021  Weight: 102.1 kg    Examination:  General exam: Appears calm and comfortable  Respiratory system: Clear to auscultation. Respiratory effort normal. No respiratory distress. No conversational dyspnea.  Cardiovascular system: S1 & S2 heard, irreg irreg rhythm, reg rate. No pedal edema. Gastrointestinal system: Abdomen is nondistended, soft and nontender. Normal bowel sounds heard. Central nervous system: Alert and oriented. No focal neurological deficits. Speech clear.  Extremities: LE's wrapped, no tenderness to palpation over the dressings Skin: No rashes, lesions or ulcers on exposed skin  Psychiatry: Judgement and insight appear normal. Mood & affect appropriate.   Data Reviewed: I have personally reviewed following labs and imaging studies  CBC: Recent Labs  Lab 12/29/20 1930 12/30/20 0412 12/30/20 0534 12/31/20 0444 01/01/21 0436 01/03/21 0539  WBC 7.5 6.6  --  6.9 7.8 6.2  NEUTROABS 6.1  --   --   --   --   --   HGB 8.4* 7.4*  7.3* 7.4* 7.8* 9.0*  HCT 27.2* 24.2* 24.1* 24.1* 26.2* 29.1*  MCV 100.7* 98.8  --  100.8* 103.1* 99.7  PLT 247 266  --  229 171 841   Basic Metabolic  Panel: Recent Labs  Lab 12/29/20 1930 12/30/20 0412 12/31/20 0444 01/01/21 0436 01/03/21 0539  NA 133* 135 138 136 134*  K 4.3 4.4 4.5 4.7 4.9  CL 102 103 105 104 94*  CO2 24 25 28 25  32  GLUCOSE 94 86 84 80 120*  BUN 23 20 22 21  29*  CREATININE 1.38* 1.13 1.27* 1.11 1.14  CALCIUM 8.4* 8.2* 8.0* 7.9* 8.5*  MG  --  2.0  --  1.9  --   PHOS  --  3.6  --   --   --    GFR: Estimated Creatinine Clearance: 74.5 mL/min (by C-G formula based on SCr of 1.14 mg/dL).  Liver Function Tests: Recent Labs  Lab 12/29/20 1930 12/30/20 0412 12/31/20 0444  AST 45* 39 31  ALT 22 20 17   ALKPHOS 110 95 88  BILITOT 0.8 1.0 0.6  PROT 5.9* 5.4* 5.2*  ALBUMIN 3.1* 2.8* 2.6*   Coagulation Profile: Recent Labs  Lab 12/29/20 1930 12/30/20 0412  INR 1.0 1.1   Sepsis Labs: Recent Labs  Lab 12/29/20 1829  LATICACIDVEN 1.3    Recent Results (from the past 240 hour(s))  Blood Culture (routine x 2)     Status: None (Preliminary result)   Collection Time: 12/29/20  6:35 PM   Specimen: BLOOD  Result Value Ref Range Status   Specimen Description   Final    BLOOD RIGHT ANTECUBITAL Performed at [facility], [place] [address], [place], [place] [address]    Special Requests   Final    BOTTLES DRAWN AEROBIC AND ANAEROBIC Blood Culture adequate volume Performed at [place] [place] [address] [place], [place] [address]    Culture   Final    NO GROWTH 4 DAYS Performed at [facility], [place] [address] [place], [place] [address]    Report Status PENDING  Incomplete  Blood Culture (routine x 2)     Status: None (Preliminary result)   Collection Time: 12/29/20  6:41 PM   Specimen: BLOOD  Result Value Ref Range Status   Specimen Description   Final    BLOOD LEFT ANTECUBITAL Performed at [facility] [address], [place], [place] [address]    Special Requests   Final    BOTTLES DRAWN AEROBIC AND ANAEROBIC Blood Culture adequate volume Performed at [facility] [address] [place], [place] [address]    Culture   Final    NO GROWTH 4 DAYS Performed at [facility], [place] [address], [place], [place] [address]    Report Status PENDING  Incomplete  Urine culture     Status: Abnormal   Collection Time: 12/29/20  8:22 PM   Specimen: In/Out Cath Urine  Result Value Ref Range Status   Specimen Description   Final    IN/OUT CATH URINE Performed at [place] [address], [place],  [address]    Special Requests   Final    NONE Performed at [facility], [place] [address] [place],  [address]    Culture MULTIPLE SPECIES PRESENT, SUGGEST RECOLLECTION (A)  Final   Report Status 12/31/2020 FINAL  Final  Resp Panel by RT-PCR (Flu A&B, Covid) Nasopharyngeal Swab     Status: None   Collection Time: 12/29/20 10:50 PM   Specimen: Nasopharyngeal Swab; Nasopharyngeal(NP) swabs in vial transport medium  Result Value Ref Range Status   SARS Coronavirus 2 by RT PCR  NEGATIVE NEGATIVE Final    Comment: (NOTE) SARS-CoV-2 target nucleic acids are NOT DETECTED.  The SARS-CoV-2 RNA is generally detectable in upper respiratory specimens during the acute phase of infection. The lowest concentration of SARS-CoV-2 viral copies this assay can detect is 138 copies/mL. A negative result does not preclude SARS-Cov-2 infection and should not be used as the sole basis for treatment or other patient management decisions. A negative result may occur with  improper specimen collection/handling, submission of specimen other than nasopharyngeal swab, presence of viral mutation(s) within the areas targeted by this assay, and inadequate number of viral copies(<138 copies/mL). A negative result must be combined  with clinical observations, patient history, and epidemiological information. The expected result is Negative.  Fact Sheet for Patients:  EntrepreneurPulse.com.au  Fact Sheet for Healthcare Providers:  IncredibleEmployment.be  This test is no t yet approved or cleared by the [place] FDA and  has been authorized for detection and/or diagnosis of SARS-CoV-2 by FDA under an Emergency Use Authorization (EUA). This EUA will remain  in effect (meaning this test can be used) for the duration of the COVID-19 declaration under Section 564(b)(1) of the Act, 21 U.S.C.section 360bbb-3(b)(1), unless the authorization is terminated  or revoked sooner.       Influenza A by PCR NEGATIVE NEGATIVE Final   Influenza B by PCR NEGATIVE NEGATIVE Final    Comment: (NOTE) The Xpert Xpress SARS-CoV-2/FLU/RSV plus assay is intended as an aid in the diagnosis of influenza from Nasopharyngeal swab specimens and should not be used as a sole basis for treatment. Nasal washings and aspirates are unacceptable for Xpert Xpress SARS-CoV-2/FLU/RSV testing.  Fact Sheet for Patients: EntrepreneurPulse.com.au  Fact Sheet for Healthcare Providers: IncredibleEmployment.be  This test is not yet approved or cleared by the [place] FDA and has been authorized for detection and/or diagnosis of SARS-CoV-2 by FDA under an Emergency Use Authorization (EUA). This EUA will remain in effect (meaning this test can be used) for the duration of the COVID-19 declaration under Section 564(b)(1) of the Act, 21 U.S.C. section 360bbb-3(b)(1), unless the authorization is terminated or revoked.  Performed at [facility], [place] [address], [place], [place] [address]       Radiology Studies: DG CHEST PORT 1 VIEW  Result Date: 01/02/2021 CLINICAL DATA:  Shortness of breath EXAM: PORTABLE CHEST 1 VIEW COMPARISON:  December 29, 2020  FINDINGS: No pneumothorax. Bilateral layering pleural effusions with underlying opacities and pulmonary edema. The cardiomediastinal silhouette is stable. No other acute abnormalities. IMPRESSION: Cardiomegaly, pulmonary edema, and bilateral pleural effusions. Electronically Signed   By: Dorise Bullion III M.D   On: 01/02/2021 15:11     Scheduled Meds:  apixaban  5 mg Oral BID   aspirin  81 mg Oral Daily   collagenase   Topical Daily   folic acid  1 mg Oral Daily   furosemide  40 mg Oral Daily   hydrALAZINE  25 mg Oral TID   ipratropium-albuterol  3 mL Nebulization BID   metoprolol tartrate  50 mg Oral BID   pantoprazole  40 mg Oral Daily   predniSONE  50 mg Oral Q breakfast   tamsulosin  0.4 mg Oral Daily   venlafaxine XR  75 mg Oral Q breakfast   Continuous Infusions:  cefTRIAXone (ROCEPHIN)  IV 2 g (01/02/21 2126)     LOS: 5 days    Time spent: 40 minutes   Shelda Pal, DO [facility] 01/03/2021, 2:48 PM  Available via Epic secure chat 7am-7pm After these hours, please refer to coverage provider listed on [email or web address]

## 2021-01-03 NOTE — Progress Notes (Signed)
Progress Note  Patient Name: Jordan [name] Date of Encounter: 01/03/2021  [facility] Cardiologist: Candee Furbish, MD   Subjective   Nurse heard rales yesterday afternoon.  Perhaps more fatigue.  Mild wheezing heard.  Additional IV Lasix was administered.  Duo nebs were started as well as 5-day course of prednisone 50.  Inpatient Medications    Scheduled Meds:  apixaban  5 mg Oral BID   aspirin  81 mg Oral Daily   collagenase   Topical Daily   folic acid  1 mg Oral Daily   hydrALAZINE  25 mg Oral TID   ipratropium-albuterol  3 mL Nebulization BID   metoprolol tartrate  50 mg Oral BID   pantoprazole  40 mg Oral Daily   predniSONE  50 mg Oral Q breakfast   tamsulosin  0.4 mg Oral Daily   venlafaxine XR  75 mg Oral Q breakfast   Continuous Infusions:  cefTRIAXone (ROCEPHIN)  IV 2 g (01/02/21 2126)   PRN Meds: acetaminophen, ipratropium-albuterol, LORazepam, methocarbamol, oxyCODONE-acetaminophen, promethazine   Vital Signs    Vitals:   01/02/21 2030 01/03/21 0450 01/03/21 0816 01/03/21 0818  BP: 121/69 (!) 101/55    Pulse: 80 93    Resp: 18 18    Temp: (!) 97.5 F (36.4 C) 97.6 F (36.4 C)    TempSrc:      SpO2: 97% 94% 95% 95%  Weight:      Height:        Intake/Output Summary (Last 24 hours) at 01/03/2021 0819 Last data filed at 01/03/2021 0024 Gross per 24 hour  Intake 838 ml  Output 3325 ml  Net -2487 ml   Last 3 Weights 12/29/2020 12/27/2020 07/23/2020  Weight (lbs) 225 lb 184 lb 15.5 oz 185 lb  Weight (kg) 102.059 kg 83.9 kg 83.915 kg      Telemetry    Heart rate has improved with metoprolol, most recently in the 90s.- Personally Reviewed  ECG    01/01/2021 personally reviewed-atrial fibrillation heart rate 96 bpm with nonspecific ST-T wave changes- Personally Reviewed  Physical Exam   GEN: No acute distress.   Neck: No JVD Cardiac: Irreg irreg, soft systolic murmur, rubs, or gallops.  Respiratory: Clear to auscultation bilaterally. GI:  Soft, nontender, non-distended  MS: Left lower extremity cellulitis noted; No deformity. Neuro:  Nonfocal  Psych: Normal affect   Labs    High Sensitivity Troponin:  No results for input(s): TROPONINIHS in the last 720 hours.    Chemistry Recent Labs  Lab 12/29/20 1930 12/30/20 0412 12/31/20 0444 01/01/21 0436 01/03/21 0539  NA 133* 135 138 136 134*  K 4.3 4.4 4.5 4.7 4.9  CL 102 103 105 104 94*  CO2 24 25 28 25  32  GLUCOSE 94 86 84 80 120*  BUN 23 20 22 21  29*  CREATININE 1.38* 1.13 1.27* 1.11 1.14  CALCIUM 8.4* 8.2* 8.0* 7.9* 8.5*  PROT 5.9* 5.4* 5.2*  --   --   ALBUMIN 3.1* 2.8* 2.6*  --   --   AST 45* 39 31  --   --   ALT 22 20 17   --   --   ALKPHOS 110 95 88  --   --   BILITOT 0.8 1.0 0.6  --   --   GFRNONAA 55* >60 >60 >60 >60  ANIONGAP 7 7 5 7 8      Hematology Recent Labs  Lab 12/31/20 0444 01/01/21 0436 01/03/21 0539  WBC 6.9 7.8 6.2  RBC 2.39* 2.54* 2.92*  HGB 7.4* 7.8* 9.0*  HCT 24.1* 26.2* 29.1*  MCV 100.8* 103.1* 99.7  MCH 31.0 30.7 30.8  MCHC 30.7 29.8* 30.9  RDW 18.5* 18.3* 16.7*  PLT 229 171 256    BNP Recent Labs  Lab 12/30/20 0412  BNP 805.0*     DDimer No results for input(s): DDIMER in the last 168 hours.   Radiology    DG CHEST PORT 1 VIEW  Result Date: 01/02/2021 CLINICAL DATA:  Shortness of breath EXAM: PORTABLE CHEST 1 VIEW COMPARISON:  December 29, 2020 FINDINGS: No pneumothorax. Bilateral layering pleural effusions with underlying opacities and pulmonary edema. The cardiomediastinal silhouette is stable. No other acute abnormalities. IMPRESSION: Cardiomegaly, pulmonary edema, and bilateral pleural effusions. Electronically Signed   By: Dorise Bullion III M.D   On: 01/02/2021 15:11    Cardiac Studies   Echocardiogram 07/23/2020 - EF 60 to 65% with grade 2 diastolic dysfunction and moderately dilated left atrial size as well as right atrial size.  Trivial MR.  Perivalvular leak in the 26 mm SAPIEN TAVR valve.  This was placed on  06/30/2020.  Mean gradient is 14.6 mmHg.  Patient Profile     70 y.o. male with atrial fibrillation rapid ventricular response, status post TAVR, essential hypertension, chronic diastolic heart failure.  Atrial fibrillation is new diagnosis.  Assessment & Plan    Paroxysmal atrial fibrillation - Was switched over to metoprolol for optimization of rate control.  He is on metoprolol 50 mg twice daily.  Stable, no change in medication strategy.  Chronic anticoagulation - Eliquis started this admission 5 mg p.o. twice daily.  Continue.  Watch for any signs of bleeding.  Stable no changes made.  Nonobstructive coronary artery disease - Seen on cardiac catheterization 03/06/2020 -Resume statin on discharge, was on atorvastatin.  This was held because of mildly elevated AST on admission.  Once again, resume statin on discharge.  Anemia - Normal MCV to slightly elevated.  Hemoglobin 7.3 on 12/30/2020.  Work-up per primary team.  Watch with anticoagulation.  He had a colonoscopy 3 years ago.  Dr. Carrolyn Meiers is his primary physician.  Recently got a B12 shot.  Continue to monitor closely  TAVR valve - On aspirin.  Discontinued Plavix.  Minimal perivalvular leak.  This should be of minimal clinical consequence.  Overall doing well.  Chronic diastolic heart failure - Mildly elevated BNP on labs.  Chest x-ray with mild edema.  Seems as though he is comfortable clinically however laying flat with concentrated urine.  Yesterday afternoon received 1 dose of IV Lasix because of some difficulty breathing.  He was out 3.3 L yesterday.  Feels well currently.  I will go ahead and resume his home dose of Lasix 40 mg p.o. daily. -Mild wheezing was noted yesterday.  He is on prednisone for 5 days and duo nebs as well.  Also receiving ceftriaxone IV.  Lower extremity cellulitis - Per primary team.  Chronic wound care.  No changes made.  His feet are dressed.  Chronic wounds noted.  He is hopeful that he can go  to a skilled nursing facility, perhaps [place] he states.  For questions or updates, please contact [place] Please consult [email or web address] for contact info under        Signed, Candee Furbish, MD  01/03/2021, 8:19 AM

## 2021-01-04 DIAGNOSIS — L03116 Cellulitis of left lower limb: Secondary | ICD-10-CM | POA: Diagnosis not present

## 2021-01-04 DIAGNOSIS — E782 Mixed hyperlipidemia: Secondary | ICD-10-CM | POA: Diagnosis not present

## 2021-01-04 DIAGNOSIS — I480 Paroxysmal atrial fibrillation: Secondary | ICD-10-CM

## 2021-01-04 DIAGNOSIS — Z515 Encounter for palliative care: Secondary | ICD-10-CM | POA: Diagnosis not present

## 2021-01-04 DIAGNOSIS — I5030 Unspecified diastolic (congestive) heart failure: Secondary | ICD-10-CM | POA: Diagnosis not present

## 2021-01-04 DIAGNOSIS — J449 Chronic obstructive pulmonary disease, unspecified: Secondary | ICD-10-CM | POA: Diagnosis not present

## 2021-01-04 DIAGNOSIS — Z7189 Other specified counseling: Secondary | ICD-10-CM | POA: Diagnosis not present

## 2021-01-04 DIAGNOSIS — I10 Essential (primary) hypertension: Secondary | ICD-10-CM | POA: Diagnosis not present

## 2021-01-04 LAB — BASIC METABOLIC PANEL
Anion gap: 6 (ref 5–15)
BUN: 34 mg/dL — ABNORMAL HIGH (ref 8–23)
CO2: 29 mmol/L (ref 22–32)
Calcium: 7.9 mg/dL — ABNORMAL LOW (ref 8.9–10.3)
Chloride: 98 mmol/L (ref 98–111)
Creatinine, Ser: 1.15 mg/dL (ref 0.61–1.24)
GFR, Estimated: >60 mL/min (ref >60)
Glucose, Bld: 108 mg/dL — ABNORMAL HIGH (ref 70–99)
Potassium: 4.2 mmol/L (ref 3.5–5.1)
Sodium: 133 mmol/L — ABNORMAL LOW (ref 135–145)

## 2021-01-04 LAB — CBC
HCT: 25.8 % — ABNORMAL LOW (ref 39.0–52.0)
Hemoglobin: 8 g/dL — ABNORMAL LOW (ref 13.0–17.0)
MCH: 30.3 pg (ref 26.0–34.0)
MCHC: 31 g/dL (ref 30.0–36.0)
MCV: 97.7 fL (ref 80.0–100.0)
Platelets: 242 10*3/uL (ref 150–400)
RBC: 2.64 MIL/uL — ABNORMAL LOW (ref 4.22–5.81)
RDW: 17.1 % — ABNORMAL HIGH (ref 11.5–15.5)
WBC: 7.9 10*3/uL (ref 4.0–10.5)
nRBC: 0 % (ref 0.0–0.2)

## 2021-01-04 LAB — RESP PANEL BY RT-PCR (FLU A&B, COVID) ARPGX2
Influenza A by PCR: NEGATIVE
Influenza B by PCR: NEGATIVE
SARS Coronavirus 2 by RT PCR: NEGATIVE

## 2021-01-04 MED ORDER — ATORVASTATIN CALCIUM 40 MG PO TABS
80.0000 mg | ORAL_TABLET | Freq: Every day | ORAL | Status: DC
  Administered 2021-01-04: 80 mg via ORAL
  Filled 2021-01-04: qty 2

## 2021-01-04 NOTE — Consult Note (Addendum)
Consultation Note Date: 01/04/2021   Patient Name: Jordan [name]  DOB: 1951/02/07  MRN: [id number]  Age / Sex: 70 y.o., male  PCP: [name], MD Referring Physician: Barb Merino, MD  Reason for Consultation: Establishing goals of care and Psychosocial/spiritual support  HPI/Patient Profile: Jordan [name] is a 70 y.o. 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 He presented to his PCP on 7/12 with c/o LLE pain and EMS was contacted. Pt was transferred to ED and was admitted on 12/29/2020 with left leg swelling, redness, and pain and subsequently treated with abx for LLE cellulitis. During hospitalization, he was found to be in AFib unctonrolled and pt placed on Eliquis.   Clinical Assessment and Goals of Care:  NPs Wadie Lessen and Jordan Hawks reviewed medical records, received report from bedside RN, assessed the patient and then meet at the patient's bedside to discuss diagnosis, prognosis, GOC, EOL wishes, disposition, and options.   The concept of Palliative Care was introduced as specialized medical care for people and their families living with serious illness. I conveyed that palliative care focuses on providing relief from the symptoms and stress of a serious illness, with the goal bering to improve quality of life for the patient.  I created space and opportunity for patient to explore his thoughts and feelings regarding his current medical situation.   Education was offered regarding advanced directives. We reviewed concepts specific to code status, artifical feeding and hydration, continued IV  antibiotics and rehospitalization.  We discussed the difference between an aggressive medical intervention path and a palliative comfort care path.   Values and goals of care important to patient were attempted to be elicited.  Jordan [name] is not married, has no children or pets, and prior to TAVR in January of 2022 lived alone independently. Over the last few months he has used his long term care insurance to have home health aides help him with ADLs. 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   Jordan [name] endorses he would like to get better so that he can live at home alone again as well as travel. [place] would [place] and visit his restaurant daily when able. He would also like to stay at his [place] place when able.   He expressed that he would like to have resuscitation and venitaltor support if there is a chance that he can live and have a quality of life similar to how his current health state is today. He would like to remain a full code with the understanding that if he was going to be "vegetable" for the rest of his life then he would not want to live on a ventilator for an extended amount of time.  After our goals of care discussion, we completed a MOST form. A copy was given to the patient and original copy was placed in shadow chart. Pt remains a full code.    The patient expressed that he has two cousins, [name] and Aguilita, that he would like to have as his HCPOA. I discussed that Spiritual Care will be consulted to facilitate completion of a HCPOA. I also suggested that the spiritual care team can visit with the patient and offer religious or spiritual support, to which the patient was in agreement.     Questions and concerns addressed.  Patient  encouraged to call with questions or concerns. PMT will continue to support holistically.   SUMMARY OF RECOMMENDATIONS   -MOST form completed -Referral to spiritual care  placed for HCPOA and spiritual needs -Outpatient Palliative Care recommended when pt d/cs to SNF  Code Status/Advance Care Planning: Full code  Additional Recommendations (Limitations, Scope, Preferences): Full Scope Treatment  Psycho-social/Spiritual:  Desire for further Chaplaincy support:yes  Prognosis:  Unable to determine  Discharge Planning: [place] for rehab with Palliative care service follow-up      Primary Diagnoses: Present on Admission:  Left leg cellulitis  Essential hypertension, benign  HLD (hyperlipidemia)  Chronic venous insufficiency  COPD (chronic obstructive pulmonary disease) ([place])  Tobacco abuse  Atrial fibrillation ([place])   I have reviewed the medical record, interviewed the patient and family, and examined the patient. The following aspects are pertinent.  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 Burt Knack and Dr. Cyndia Bent   Severe aortic stenosis    Severe aortic insufficiency, moderate to severe aortic stenosis   Sleep apnea    CPAP    Venous insufficiency    managed by ortho (Dr. Sharol Given)   Social History   Socioeconomic History   Marital status: Single    Spouse name: Not on file   Number of children: 0   Years of education: Not on file   Highest education level: Not on file  Occupational History   Occupation: retired Youth worker: RETIRED  Tobacco Use   Smoking status: Some Days    Packs/day: 0.75    Years:  55.00    Pack years: 41.25    Types: Cigarettes   Smokeless tobacco: Never  Vaping Use   Vaping Use: Never used  Substance and Sexual Activity   Alcohol use: No    Alcohol/week: 0.0 standard drinks   Drug use: No   Sexual activity: Yes    Birth control/protection: Condom  Other Topics Concern   Not on file  Social History Narrative   Lives alone.     Social Determinants of Health   Financial Resource Strain: Not on file  Food Insecurity: Not on file  Transportation Needs: Not on file  Physical Activity: Not on file  Stress: Not on file  Social Connections:  Not on file   Family History  Problem Relation Age of Onset   Hypertension Mother    Alzheimer's disease Mother    Stroke Father        x 3   Heart attack Father    Congestive Heart Failure Father    Diabetes Other        mat great aunts   Colon cancer Neg Hx    Rectal cancer Neg Hx    Esophageal cancer Neg Hx    Scheduled Meds:  apixaban  5 mg Oral BID   aspirin  81 mg Oral Daily   atorvastatin  80 mg Oral Daily   collagenase   Topical Daily   folic acid  1 mg Oral Daily   furosemide  40 mg Oral Daily   ipratropium-albuterol  3 mL Nebulization BID   metoprolol tartrate  50 mg Oral BID   pantoprazole  40 mg Oral Daily   predniSONE  50 mg Oral Q breakfast   tamsulosin  0.4 mg Oral Daily   venlafaxine XR  75 mg Oral Q breakfast   Continuous Infusions:  cefTRIAXone (ROCEPHIN)  IV 2 g (01/03/21 2121)   PRN Meds:.acetaminophen, ipratropium-albuterol, LORazepam, methocarbamol, oxyCODONE-acetaminophen, promethazine Medications Prior to Admission:  Prior to Admission medications   Medication Sig Start Date End Date Taking? Authorizing Provider  aspirin 81 MG tablet Take 81 mg by mouth daily.   Yes [provider]  atenolol (TENORMIN) 25 MG tablet Take 1 tablet (25 mg total) by mouth daily. 03/18/20  Yes Weaver, [name] T, PA-C  atorvastatin (LIPITOR) 80 MG tablet Take 80 mg by mouth daily after supper.     Yes [provider]  budesonide-formoterol (SYMBICORT) 160-4.5 MCG/ACT inhaler Inhale 2 puffs into the lungs in the morning and at bedtime. Patient taking differently: Inhale 2 puffs into the lungs daily. 03/12/20  Yes Aline August, MD  butalbital-acetaminophen-caffeine (FIORICET, ESGIC) 50-325-40 MG per tablet Take 1 tablet by mouth 2 (two) times daily as needed for migraine.    Yes [provider]  celecoxib (CELEBREX) 200 MG capsule Take 1 capsule (200 mg total) by mouth 2 (two) times daily. 09/23/20  Yes Persons, Bevely Palmer, [place]  clopidogrel (PLAVIX) 75 MG tablet Take 1 tablet (75 mg total) by mouth daily with breakfast. 07/03/20  Yes Eileen Stanford, PA-C  folic acid (FOLVITE) 1 MG tablet Take 1 tablet (1 mg total) by mouth daily. 11/04/14  Yes [name], MD  furosemide (LASIX) 40 MG tablet Take 1 tablet (40 mg total) by mouth daily. 03/12/20  Yes Aline August, MD  hydrALAZINE (APRESOLINE) 50 MG tablet Take 1.5 tablets (75 mg total) by mouth 3 (three) times daily. Patient taking differently: Take 50 mg by mouth 2 (two) times daily. 07/23/20 07/18/21 Yes Eileen Stanford, PA-C  ibuprofen (ADVIL) 200 MG tablet Take 400 mg by mouth every 6 (six) hours as needed (for headaches).   Yes [provider]  ipratropium-albuterol (DUONEB) 0.5-2.5 (3) MG/3ML SOLN Take 3 mLs by nebulization every 6 (six) hours as needed (shortness of breath/wheezing). 03/12/20  Yes Aline August, MD  LORazepam (ATIVAN) 1 MG tablet Take 0.5 tablets (0.5 mg total) by mouth 2 (two) times daily as needed for anxiety. 03/12/20  Yes Aline August, MD  methocarbamol (ROBAXIN) 750 MG tablet Take 750 mg by mouth as needed for muscle spasms. 06/03/20  Yes [provider]  montelukast (SINGULAIR) 10 MG tablet Take 10 mg by mouth daily. 02/07/20  Yes [provider]  Multiple Vitamin (MULTIVITAMIN WITH MINERALS) TABS Take 1 tablet by mouth daily.   Yes [provider]  nystatin  cream (MYCOSTATIN) Apply 1 application topically 2 (two) times daily. 06/24/20  Yes Eileen Stanford, PA-C  oxyCODONE-acetaminophen (PERCOCET) 5-325 MG tablet Take 1 tablet by mouth every 4 (four) hours as needed. Patient taking differently: Take 1 tablet by mouth as needed (knee pain). 05/22/20  Yes Diona Browner, DMD  pantoprazole (PROTONIX) 40 MG tablet Take 1 tablet (40 mg total) by mouth daily. 07/03/20  Yes Eileen Stanford, PA-C  promethazine (PHENERGAN) 25 MG tablet Take 25 mg by mouth every 8 (eight) hours as needed for nausea/vomiting. 05/14/20  Yes [provider]  venlafaxine XR (EFFEXOR-XR) 75 MG 24 hr capsule Take 75 mg by mouth in the morning and at bedtime. MUST HAVE BRAND NAME ONLY   Yes [provider]   Allergies  Allergen Reactions   Altace [Ramipril] Other (See Comments)    Dizziness, migraine, weakness- "there is dye in it"   Codeine Itching   Naproxen Itching   Review of Systems  Respiratory:  Negative for apnea, cough and shortness of breath.   Cardiovascular:  Negative for chest pain.  Gastrointestinal:  Negative for abdominal distention.  Musculoskeletal:  Negative for back pain.  Skin:  Negative for color change.  Neurological:  Positive for weakness. Negative for facial asymmetry and numbness.  Psychiatric/Behavioral:  Negative for agitation and confusion.    Physical Exam Constitutional:      Appearance: Normal appearance.  HENT:     Head: Normocephalic and atraumatic.  Pulmonary:     Effort: Pulmonary effort is normal.  Musculoskeletal:     Left lower leg: Edema present.  Skin:    General: Skin is warm and dry.  Neurological:     Mental Status: He is alert and oriented to person, place, and time.  Psychiatric:        Mood and Affect: Mood normal.    Vital Signs: BP 130/72 (BP Location: Left Arm)   Pulse 86   Temp 97.6 F (36.4 C)   Resp 19   Ht 6' (1.829 m)   Wt 102.1 kg   SpO2 97%   BMI 30.52 kg/m  Pain Scale:  0-10 POSS *See Group Information*: 1-Acceptable,Awake and alert Pain Score: 0-No pain   SpO2: SpO2: 97 % O2 Device:SpO2: 97 % O2 Flow Rate: .O2 Flow Rate (L/min): 2 L/min  IO: Intake/output summary:  Intake/Output Summary (Last 24 hours) at 01/04/2021 1433 Last data filed at 01/04/2021 1328 Gross per 24 hour  Intake 712 ml  Output 1750 ml  Net -1038 ml    LBM: Last BM Date: 01/01/21 Baseline Weight: Weight: 102.1 kg Most recent weight: Weight: 102.1 kg     Palliative Assessment/Data:    Time In: 1300 Time out: 1415 Total: 70 minutes  Greater than 50%  of this time was spent counseling and coordinating care related to the above assessment and plan.  Signed by: Wadie Lessen, NP  This NP was present  for above assessment and treatment plan    Please contact Palliative Medicine Team phone at [phone] for questions and concerns.  For individual provider: See Shea Evans

## 2021-01-04 NOTE — Progress Notes (Signed)
PROGRESS NOTE    Jordan [name]  SFK:[id number] DOB: 1951-05-09 DOA: 12/29/2020 PCP: [name], MD    Brief Narrative:  70 year old male with history of TAVR on 06/2020, heart failure with preserved ejection fraction, hypertension, hyperlipidemia, chronic venous insufficiency with bilateral lower extremity edema and venous stasis ulcers, ongoing smoker and COPD presented to the emergency department due to left leg swelling, redness and pain.  He went to primary care physician's office and was sent to ER. While getting treatment in the hospital, he developed more shortness of breath and fatigue and found to have fluid overload as well developed A. fib that was reportedly new onset. Stabilizing.  Assessment & Plan:   Principal Problem:   Left leg cellulitis Active Problems:   Essential hypertension, benign   HLD (hyperlipidemia)   Chronic venous insufficiency   Tobacco abuse   COPD (chronic obstructive pulmonary disease) (HCC)   Open wound of left foot   Macrocytic anemia   Arthritis   (HFpEF) heart failure with preserved ejection fraction (HCC)   Atrial fibrillation (HCC)  Left leg cellulitis: With history of chronic venous stasis ulcer.  Treated with Rocephin with appropriate improvement.  Continue local wound care.  Continue Rocephin while in the hospital, will treat with oral antibiotics on discharge for total 10 days of therapy. Seen by wound care team, will continue wound care on discharge.  New onset A. fib: Not previously reported.  Started metoprolol 50 mg twice daily and Eliquis 5 mg twice daily.  Heart rate is fairly controlled now.  Essential hypertension: Blood pressure is stable.  GERD: On PPI.  Continue.  Acute on chronic heart failure with preserved ejection fraction: Treated with IV Lasix.  Over diuresed with development of hypotension.  Received some IV fluid with improvement. Patient was also treated for presumed COPD exacerbation with short course of  steroids and bronchodilators especially given his history of COPD and smoking.      DVT prophylaxis: SCDs Start: 12/29/20 2304 apixaban (ELIQUIS) tablet 5 mg   Code Status: Full code Family Communication: None at the bedside Disposition Plan: Status is: Inpatient  Remains inpatient appropriate because:Inpatient level of care appropriate due to severity of illness  Dispo: The patient is from: Home              Anticipated d/c is to: SNF              Patient currently is medically stable to d/c.   Difficult to place patient No         Consultants:  Cardiology  Procedures:  None  Antimicrobials:  Antibiotics Given (last 72 hours)     Date/Time Action Medication Dose Rate   01/01/21 2126 New Bag/Given   cefTRIAXone (ROCEPHIN) 2 g in sodium chloride 0.9 % 100 mL IVPB 2 g 200 mL/hr   01/02/21 2126 New Bag/Given   cefTRIAXone (ROCEPHIN) 2 g in sodium chloride 0.9 % 100 mL IVPB 2 g 200 mL/hr   01/03/21 2121 New Bag/Given   cefTRIAXone (ROCEPHIN) 2 g in sodium chloride 0.9 % 100 mL IVPB 2 g 200 mL/hr          Subjective: Patient seen and examined.  Today he denies any complaints.No other events overnight.  Blood pressure stabilized.  Objective: Vitals:   01/03/21 2000 01/04/21 0512 01/04/21 0802 01/04/21 1249  BP: 117/70 118/61  130/72  Pulse: (!) 105 85  86  Resp: 20 18  19   Temp: 97.6 F (36.4 C) 97.8 F (  36.6 C)  97.6 F (36.4 C)  TempSrc: Oral Axillary    SpO2: 92% 94% 98% 97%  Weight:      Height:        Intake/Output Summary (Last 24 hours) at 01/04/2021 1711 Last data filed at 01/04/2021 1328 Gross per 24 hour  Intake 712 ml  Output 1000 ml  Net -288 ml   Filed Weights   12/29/20 2021  Weight: 102.1 kg    Examination:  General exam: Appears calm and comfortable  Looks comfortable today. Respiratory system: Clear to auscultation. Respiratory effort normal. Cardiovascular system: Irregularly irregular.  He does have edema of both  extremities but mostly chronic venous stasis ulcer and edema.  Both legs are wrapped in Ace bandage.   Gastrointestinal system: Abdomen is nondistended, soft and nontender. No organomegaly or masses felt. Normal bowel sounds heard. Central nervous system: Alert and oriented. No focal neurological deficits. Extremities: Symmetric 5 x 5 power. Skin: No rashes, lesions or ulcers Psychiatry: Judgement and insight appear normal. Mood & affect appropriate.     Data Reviewed: I have personally reviewed following labs and imaging studies  CBC: Recent Labs  Lab 12/29/20 1930 12/30/20 0412 12/30/20 0534 12/31/20 0444 01/01/21 0436 01/03/21 0539 01/04/21 0437  WBC 7.5 6.6  --  6.9 7.8 6.2 7.9  NEUTROABS 6.1  --   --   --   --   --   --   HGB 8.4* 7.4* 7.3* 7.4* 7.8* 9.0* 8.0*  HCT 27.2* 24.2* 24.1* 24.1* 26.2* 29.1* 25.8*  MCV 100.7* 98.8  --  100.8* 103.1* 99.7 97.7  PLT 247 266  --  229 171 256 676   Basic Metabolic Panel: Recent Labs  Lab 12/30/20 0412 12/31/20 0444 01/01/21 0436 01/03/21 0539 01/04/21 0437  NA 135 138 136 134* 133*  K 4.4 4.5 4.7 4.9 4.2  CL 103 105 104 94* 98  CO2 25 28 25  32 29  GLUCOSE 86 84 80 120* 108*  BUN 20 22 21  29* 34*  CREATININE 1.13 1.27* 1.11 1.14 1.15  CALCIUM 8.2* 8.0* 7.9* 8.5* 7.9*  MG 2.0  --  1.9  --   --   PHOS 3.6  --   --   --   --    GFR: Estimated Creatinine Clearance: 73.9 mL/min (by C-G formula based on SCr of 1.15 mg/dL). Liver Function Tests: Recent Labs  Lab 12/29/20 1930 12/30/20 0412 12/31/20 0444  AST 45* 39 31  ALT 22 20 17   ALKPHOS 110 95 88  BILITOT 0.8 1.0 0.6  PROT 5.9* 5.4* 5.2*  ALBUMIN 3.1* 2.8* 2.6*   No results for input(s): LIPASE, AMYLASE in the last 168 hours. No results for input(s): AMMONIA in the last 168 hours. Coagulation Profile: Recent Labs  Lab 12/29/20 1930 12/30/20 0412  INR 1.0 1.1   Cardiac Enzymes: No results for input(s): CKTOTAL, CKMB, CKMBINDEX, TROPONINI in the last 168  hours. BNP (last 3 results) No results for input(s): PROBNP in the last 8760 hours. HbA1C: No results for input(s): HGBA1C in the last 72 hours. CBG: No results for input(s): GLUCAP in the last 168 hours. Lipid Profile: No results for input(s): CHOL, HDL, LDLCALC, TRIG, CHOLHDL, LDLDIRECT in the last 72 hours. Thyroid Function Tests: No results for input(s): TSH, T4TOTAL, FREET4, T3FREE, THYROIDAB in the last 72 hours. Anemia Panel: No results for input(s): VITAMINB12, FOLATE, FERRITIN, TIBC, IRON, RETICCTPCT in the last 72 hours. Sepsis Labs: Recent Labs  Lab 12/29/20 1829  LATICACIDVEN 1.3    Recent Results (from the past 240 hour(s))  Blood Culture (routine x 2)     Status: None   Collection Time: 12/29/20  6:35 PM   Specimen: BLOOD  Result Value Ref Range Status   Specimen Description   Final    BLOOD RIGHT ANTECUBITAL Performed at [facility] [address] [place], [place] [address]    Special Requests   Final    BOTTLES DRAWN AEROBIC AND ANAEROBIC Blood Culture adequate volume Performed at [facility] [address] [place], [place] [address]    Culture   Final    NO GROWTH 5 DAYS Performed at [facility], [place] [address] [place], [place] [address]    Report Status 01/03/2021 FINAL  Final  Blood Culture (routine x 2)     Status: None   Collection Time: 12/29/20  6:41 PM   Specimen: BLOOD  Result Value Ref Range Status   Specimen Description   Final    BLOOD LEFT ANTECUBITAL Performed at [facility] [address] [place], [place] [address]    Special Requests   Final    BOTTLES DRAWN AEROBIC AND ANAEROBIC Blood Culture adequate volume Performed at [facility] [address] [place], [place] [address]    Culture   Final    NO GROWTH 5 DAYS Performed at [facility], [place] [address], [place], [place] [address]    Report Status 01/03/2021 FINAL  Final  Urine  culture     Status: Abnormal   Collection Time: 12/29/20  8:22 PM   Specimen: In/Out Cath Urine  Result Value Ref Range Status   Specimen Description   Final    IN/OUT CATH URINE Performed at [facility] [address] [place], [place] [address]    Special Requests   Final    NONE Performed at [facility], [place] [address], [place], [place] [address]    Culture MULTIPLE SPECIES PRESENT, SUGGEST RECOLLECTION (A)  Final   Report Status 12/31/2020 FINAL  Final  Resp Panel by RT-PCR (Flu A&B, Covid) Nasopharyngeal Swab     Status: None   Collection Time: 12/29/20 10:50 PM   Specimen: Nasopharyngeal Swab; Nasopharyngeal(NP) swabs in vial transport medium  Result Value Ref Range Status   SARS Coronavirus 2 by RT PCR NEGATIVE NEGATIVE Final    Comment: (NOTE) SARS-CoV-2 target nucleic acids are NOT DETECTED.  The SARS-CoV-2 RNA is generally detectable in upper respiratory specimens during the acute phase of infection. The lowest concentration of SARS-CoV-2 viral copies this assay can detect is 138 copies/mL. A negative result does not preclude SARS-Cov-2 infection and should not be used as the sole basis for treatment or other patient management decisions. A negative result may occur with  improper specimen collection/handling, submission of specimen other than nasopharyngeal swab, presence of viral mutation(s) within the areas targeted by this assay, and inadequate number of viral copies(<138 copies/mL). A negative result must be combined with clinical observations, patient history, and epidemiological information. The expected result is Negative.  Fact Sheet for Patients:  EntrepreneurPulse.com.au  Fact Sheet for Healthcare Providers:  IncredibleEmployment.be  This test is no t yet approved or cleared by the [place] FDA and  has been authorized for detection and/or diagnosis of SARS-CoV-2 by FDA  under an Emergency Use Authorization (EUA). This EUA will remain  in effect (meaning this test can be used) for the  duration of the COVID-19 declaration under Section 564(b)(1) of the Act, 21 U.S.C.section 360bbb-3(b)(1), unless the authorization is terminated  or revoked sooner.       Influenza A by PCR NEGATIVE NEGATIVE Final   Influenza B by PCR NEGATIVE NEGATIVE Final    Comment: (NOTE) The Xpert Xpress SARS-CoV-2/FLU/RSV plus assay is intended as an aid in the diagnosis of influenza from Nasopharyngeal swab specimens and should not be used as a sole basis for treatment. Nasal washings and aspirates are unacceptable for Xpert Xpress SARS-CoV-2/FLU/RSV testing.  Fact Sheet for Patients: EntrepreneurPulse.com.au  Fact Sheet for Healthcare Providers: IncredibleEmployment.be  This test is not yet approved or cleared by the [place] FDA and has been authorized for detection and/or diagnosis of SARS-CoV-2 by FDA under an Emergency Use Authorization (EUA). This EUA will remain in effect (meaning this test can be used) for the duration of the COVID-19 declaration under Section 564(b)(1) of the Act, 21 U.S.C. section 360bbb-3(b)(1), unless the authorization is terminated or revoked.  Performed at [facility], [place] [address], [place], [place] [address]          Radiology Studies: No results found.      Scheduled Meds:  apixaban  5 mg Oral BID   aspirin  81 mg Oral Daily   atorvastatin  80 mg Oral Daily   collagenase   Topical Daily   folic acid  1 mg Oral Daily   furosemide  40 mg Oral Daily   ipratropium-albuterol  3 mL Nebulization BID   metoprolol tartrate  50 mg Oral BID   pantoprazole  40 mg Oral Daily   predniSONE  50 mg Oral Q breakfast   tamsulosin  0.4 mg Oral Daily   venlafaxine XR  75 mg Oral Q breakfast   Continuous Infusions:  cefTRIAXone (ROCEPHIN)  IV 2 g (01/03/21 2121)     LOS: 6  days    Time spent: 25 minutes    Barb Merino, MD [facility] Pager [phone]

## 2021-01-04 NOTE — Care Management Important Message (Signed)
Important Message  Patient Details IM Letter given to the Patient. Name: Jordan [name] MRN: [id number] Date of Birth: [birth year]   Medicare Important Message Given:  Yes     Kerin Salen 01/04/2021, 10:34 AM

## 2021-01-04 NOTE — Progress Notes (Signed)
Progress Note  Patient Name: Jordan [name] Date of Encounter: 01/04/2021  [facility] Cardiologist: Candee Furbish, MD   Subjective   No significant overnight events. Patient doing well this morning. He notes occasional palpitations but not consistently. No chest pain or shortness of breath. He had some hypotension yesterday with systolic BP in the 34H and had some lightheadedness/dizziness with this but states this has resolved. He denies any obvious bleeding in urine or stools - no hematuria, hematochezia, or melena.  Inpatient Medications    Scheduled Meds:  apixaban  5 mg Oral BID   aspirin  81 mg Oral Daily   collagenase   Topical Daily   folic acid  1 mg Oral Daily   furosemide  40 mg Oral Daily   hydrALAZINE  25 mg Oral TID   ipratropium-albuterol  3 mL Nebulization BID   metoprolol tartrate  50 mg Oral BID   pantoprazole  40 mg Oral Daily   predniSONE  50 mg Oral Q breakfast   tamsulosin  0.4 mg Oral Daily   venlafaxine XR  75 mg Oral Q breakfast   Continuous Infusions:  cefTRIAXone (ROCEPHIN)  IV 2 g (01/03/21 2121)   PRN Meds: acetaminophen, ipratropium-albuterol, LORazepam, methocarbamol, oxyCODONE-acetaminophen, promethazine   Vital Signs    Vitals:   01/03/21 1200 01/03/21 1351 01/03/21 2000 01/04/21 0512  BP: (!) 92/48 (!) 96/49 117/70 118/61  Pulse:   (!) 105 85  Resp:   20 18  Temp:  97.8 F (36.6 C) 97.6 F (36.4 C) 97.8 F (36.6 C)  TempSrc:  Oral Oral Axillary  SpO2:  96% 92% 94%  Weight:      Height:        Intake/Output Summary (Last 24 hours) at 01/04/2021 0724 Last data filed at 01/04/2021 0403 Gross per 24 hour  Intake 960 ml  Output 1750 ml  Net -790 ml   Last 3 Weights 12/29/2020 12/27/2020 07/23/2020  Weight (lbs) 225 lb 184 lb 15.5 oz 185 lb  Weight (kg) 102.059 kg 83.9 kg 83.915 kg      Telemetry    Atrial fibrillation in the 80s to low 100s. - Personally Reviewed  ECG    No new ECG tracing since 01/01/2021. - Personally  Reviewed  Physical Exam   GEN: No acute distress.   Neck: No JVD. Cardiac: Irregularly irregular rhythm. No significant murmurs, gallops, or rubs.  Respiratory: Clear to auscultation bilaterally. No wheezes, rhonchi, or rales. GI: Soft, non-distended, and non-tender. MS: Edema of left lower extremity, especially around ankle (patient reports this is chronic from prior injury. Both legs wrapped in ACE bandage. Skin: Warm and dry. Neuro:  No focal deficits. Psych: Normal affect. Responds appropriately.  Labs    High Sensitivity Troponin:  No results for input(s): TROPONINIHS in the last 720 hours.    Chemistry Recent Labs  Lab 12/29/20 1930 12/30/20 0412 12/31/20 0444 01/01/21 0436 01/03/21 0539 01/04/21 0437  NA 133* 135 138 136 134* 133*  K 4.3 4.4 4.5 4.7 4.9 4.2  CL 102 103 105 104 94* 98  CO2 24 25 28 25  32 29  GLUCOSE 94 86 84 80 120* 108*  BUN 23 20 22 21  29* 34*  CREATININE 1.38* 1.13 1.27* 1.11 1.14 1.15  CALCIUM 8.4* 8.2* 8.0* 7.9* 8.5* 7.9*  PROT 5.9* 5.4* 5.2*  --   --   --   ALBUMIN 3.1* 2.8* 2.6*  --   --   --   AST 45*  39 31  --   --   --   ALT 22 20 17   --   --   --   ALKPHOS 110 95 88  --   --   --   BILITOT 0.8 1.0 0.6  --   --   --   GFRNONAA 55* >60 >60 >60 >60 >60  ANIONGAP 7 7 5 7 8 6      Hematology Recent Labs  Lab 01/01/21 0436 01/03/21 0539 01/04/21 0437  WBC 7.8 6.2 7.9  RBC 2.54* 2.92* 2.64*  HGB 7.8* 9.0* 8.0*  HCT 26.2* 29.1* 25.8*  MCV 103.1* 99.7 97.7  MCH 30.7 30.8 30.3  MCHC 29.8* 30.9 31.0  RDW 18.3* 16.7* 17.1*  PLT 171 256 242    BNP Recent Labs  Lab 12/30/20 0412  BNP 805.0*     DDimer No results for input(s): DDIMER in the last 168 hours.   Radiology    DG CHEST PORT 1 VIEW  Result Date: 01/02/2021 CLINICAL DATA:  Shortness of breath EXAM: PORTABLE CHEST 1 VIEW COMPARISON:  December 29, 2020 FINDINGS: No pneumothorax. Bilateral layering pleural effusions with underlying opacities and pulmonary edema. The  cardiomediastinal silhouette is stable. No other acute abnormalities. IMPRESSION: Cardiomegaly, pulmonary edema, and bilateral pleural effusions. Electronically Signed   By: Dorise Bullion III M.D   On: 01/02/2021 15:11    Cardiac Studies   Right/Left Cardiac Catheterization 03/06/2020: 1.  Nonobstructive coronary artery disease with mild nonobstructive plaquing in the RCA, wide patency of the left main, wide patency of the LAD, and mild to moderate mid circumflex stenosis 2.  Moderate aortic stenosis with mean gradient 26 mmHg and calculated aortic valve area 1.6 cm. 3.  Moderate pulmonary hypertension with mean PA pressure 34 mmHg, transpulmonary gradient 13 mmHg, PVR less than 2 Wood units  Diagnostic Dominance: Right    _______________  Echocardiogram 07/23/2020: Impressions: 1. Left ventricular ejection fraction, by estimation, is 60 to 65%. The  left ventricle has normal function. The left ventricle has no regional  wall motion abnormalities. The left ventricular internal cavity size was  mildly to moderately dilated. There  is mild concentric left ventricular hypertrophy. Left ventricular  diastolic parameters are consistent with Grade II diastolic dysfunction  (pseudonormalization).   2. Right ventricular systolic function is normal. The right ventricular  size is normal. There is mildly elevated pulmonary artery systolic  pressure.   3. Left atrial size was moderately dilated.   4. Right atrial size was moderately dilated.   5. The mitral valve is degenerative. Trivial mitral valve regurgitation.   6. Perivalvular leak at the native [place] commissure.. The aortic valve  has been repaired/replaced. Aortic valve regurgitation is mild to  moderate. There is a 26 mm Sapien prosthetic (TAVR) valve present in the  aortic position. Procedure Date:  06/30/2020. Aortic valve mean gradient measures 14.6 mmHg.   7. The inferior vena cava is dilated in size with >50% respiratory   variability, suggesting right atrial pressure of 8 mmHg.   Comparison(s): Prior images reviewed side by side.   Conclusion(s)/Recommendation(s): S/P TAVR, appears well seated. Mean gradient 15 mmHg, prior was 19 mmHg on most recent echo. Perivalvular leak is more prominent on current study, mild to moderate based on images and PHT.   Patient Profile     70 y.o. male with a history of mild non-obstructive CAD noted on cardiac catheterization in 02/2020, severe aortic stenosis s/p TAVR in 02/3234, chronic diastolic CHF, paroxsymal atrial fibrillation  on Eliquis, COPD with ongoing tobacco use, chronic venous insufficiency, hypertension, hyperlipidemia, diabetes, anxiety and depression who presented to the ED on 12/30/2020 with lower extremity swelling with pain and redness and was admitted for cellulitis and started on IV antibiotics. Found to be in new onset rate controlled atrial fibrillation on admission for which Cardiology was consulted.  Assessment & Plan    New Onset Atrial Fibrillation - Mostly rate controlled in the 80s to low 100s. - Electrolytes normal. - Echo from 07/2020 showed normal LV function. - Continue Lopressor 50mg  twice daily.  - CHA2DS2-VASc = 5 (CHF, CAD, HTN, DM, age x1). Started on Eliquis 5mg  twice daily. Continue.  Chronic Diastolic CHF - BNP 413 on admission. - Chest x-ray on 7/16 showed cardiomegaly with pulmonary edema and bilateral pleural effusions. - Echo in 07/2020 showed LVEF of 60-65% with grade 2 diastolic dysfunction. - He received one dose of IV Lasix on 01/02/2021 after RN reported crackles and then was restarted on home PO Lasix 40mg  daily. Net negative 3.8 L this admission. No updated weights since admission. Creatinine stable but BUN rising. - Appears euvolemic on exam. - Continue PO Lasix 40mg  daily. - Will continue to monitor daily weights closely.  Non-Obstructive CAD - Noted on cardiac catheterization in 02/2020.  - No angina. - Continue aspirin  and beta-blocker. - Home Lipitor 80mg  daily was held on admission to to slightly elevated LFTs which have normalized. Will restart. - Will ask RN to monitor daily weights while here. Continue to monitor strict I/Os and renal function.  Aortic Stenosis s/p TAVR in 06/2020 - Post-op Echo in 07/2020 showed well seated TAVR with perivalvular leak at the native [place] commissure and mild to moderate AI. Mean gradient 14.6 mmHg (19 mmHg on prior Echo). Perivalvular leak more prominent compared to prior study.  Hypertension - BP soft at times but stable. Systolic BP in the 80 one time yesterday but has since improved. Systolic BP in the 244W this morning. Patient did have some lightheadedness/dizziness when BP was in the 80s yesterday but this has now resolved.  - Continue Lopressor 50mg  twice daily.  - Will discontinue Hydralazine to allow for more rate control if needed.  Chronic Venous Insufficiency Lower Extremity Cellulitis - Still being treated with IV Rocephin.  - Leg are currently wrapped. - Management per primary team.  Hyperlipidemia - Home Lipitor 80mg  daily was held on admission due to mildly elevated LFTs but these have normalized. Therefore, will restart.  Type Diabetes Mellitus - Management per primary team.  COPD - Started on Prednisone. - Management per primary team.  Macrocytic Anemia - Hemoglobin as low as 7.3 on admission. Stable at 8.0 today. Baseline earlier this year was round 9-10. - Vitmain B12 >7,500. Of note, he recently got a B12 shot. - Folate normal.  - Management per primary team.   For questions or updates, please contact [place] Please consult [email or web address] for contact info under        Signed, Darreld Mclean, PA-C  01/04/2021, 7:24 AM

## 2021-01-04 NOTE — Progress Notes (Signed)
Physical Therapy Treatment Patient Details Name: Jordan [name] MRN: [id number] DOB: 1951/01/11 Today's Date: 01/04/2021    History of Present Illness Patient is 70 y.o. male presented to ED with Lt LE swelling, redness, and pain. Pt recently seen in ED on July 10th due to a fall over the weeken and was noted to have a right knee contusion with open wound left leg wound and was discharged home to continue with home medications. PMH significant for s/p TAVR (06/30/2020), HFpEF (LVEF 60 to 46%) with LV diastolic parameters consistent with grade 2 diastolic dysfunction, HTN, HLD, CAD, Anxiety, chronic venous insufficiency with Bil LE edema and history of venous stasis ulcers, ongoing tobacco abuse with COPD, and OA.    PT Comments    Patient with improved activity tolerance today. He required min-mod assist for bed mobility and for power up from elevated EOB. Patient was able to ambulate ~20' with Mod assist using RW and chair follow for safety. Pt required cues to maintain safe proximity to RW and distance somewhat limited by knee pain. Acute PT will continue to progress pt as able.     Follow Up Recommendations  SNF;Supervision/Assistance - 24 hour     Equipment Recommendations  None recommended by PT    Recommendations for Other Services       Precautions / Restrictions Precautions Precautions: Fall Precaution Comments: watch HR, afib Restrictions Weight Bearing Restrictions: No    Mobility  Bed Mobility Overal bed mobility: Needs Assistance Bed Mobility: Supine to Sit     Supine to sit: HOB elevated;Min assist     General bed mobility comments: assist for LE's off EOB, pt able to use bed rail to raise trunk up and scoot to EOB.    Transfers Overall transfer level: Needs assistance Equipment used: Rolling walker (2 wheeled) Transfers: Sit to/from Stand Sit to Stand: Mod assist;+2 safety/equipment;From elevated surface         General transfer comment: mod assist  with 2+ for safety, cues for hand placement for single UE use for power up, bed slightly elevated. pt steady once standing.  Ambulation/Gait Ambulation/Gait assistance: Min assist;Mod assist Gait Distance (Feet): 20 Feet Assistive device: Rolling walker (2 wheeled) Gait Pattern/deviations: Decreased stride length;Step-through pattern;Decreased step length - right;Decreased step length - left;Trunk flexed;Shuffle Gait velocity: decr   General Gait Details: cues for posture and pt unable to obtain upright position due to kyphosis. pt with short shuffled steps secondary to hip weakness and pain.   Stairs             Wheelchair Mobility    Modified Rankin (Stroke Patients Only)       Balance Overall balance assessment: Needs assistance Sitting-balance support: Bilateral upper extremity supported;Feet supported Sitting balance-Leahy Scale: Fair     Standing balance support: During functional activity;Bilateral upper extremity supported Standing balance-Leahy Scale: Poor                              Cognition Arousal/Alertness: Awake/alert Behavior During Therapy: WFL for tasks assessed/performed Overall Cognitive Status: Within Functional Limits for tasks assessed                                        Exercises      General Comments        Pertinent Vitals/Pain Pain Assessment: Faces Faces Pain Scale:  Hurts little more Pain Location: Rt knee Pain Descriptors / Indicators: Aching;Discomfort Pain Intervention(s): Limited activity within patient's tolerance;Monitored during session;Repositioned    Home Living                      Prior Function            PT Goals (current goals can now be found in the care plan section) Acute Rehab PT Goals Patient Stated Goal: LE's stop hurting and regain mobility PT Goal Formulation: With patient Time For Goal Achievement: 01/15/21 Potential to Achieve Goals: Fair Progress towards  PT goals: Progressing toward goals    Frequency    Min 2X/week      PT Plan Current plan remains appropriate    Co-evaluation              AM-PAC PT "6 Clicks" Mobility   Outcome Measure  Help needed turning from your back to your side while in a flat bed without using bedrails?: A Little Help needed moving from lying on your back to sitting on the side of a flat bed without using bedrails?: A Little Help needed moving to and from a bed to a chair (including a wheelchair)?: A Lot Help needed standing up from a chair using your arms (e.g., wheelchair or bedside chair)?: A Lot Help needed to walk in hospital room?: A Lot Help needed climbing 3-5 steps with a railing? : A Lot 6 Click Score: 14    End of Session Equipment Utilized During Treatment: Gait belt Activity Tolerance: Patient limited by pain Patient left: with call bell/phone within reach;in chair;with chair alarm set Nurse Communication: Mobility status PT Visit Diagnosis: Muscle weakness (generalized) (M62.81);Difficulty in walking, not elsewhere classified (R26.2);Other abnormalities of gait and mobility (R26.89)     Time: 9381-0175 PT Time Calculation (min) (ACUTE ONLY): 18 min  Charges:  $Gait Training: 8-22 mins                     Verner Mould, DPT Acute Rehabilitation Services Office [phone] Pager [phone]    Jacques Navy 01/04/2021, 12:32 PM

## 2021-01-04 NOTE — Progress Notes (Signed)
Chaplain responded to call from nursing staff that patient needed a copy of Advanced Directive.  Chaplain met with patient and explained [employer].  Patient asked pertinent clarifying questions and thanked chaplain. Will pass this along for f/u.  Rev. Tamsen Snider  Pager [phone]

## 2021-01-04 NOTE — TOC Progression Note (Signed)
Transition of Care [facility]) - Progression Note    Patient Details  Name: Jordan [name] MRN: [id number] Date of Birth: 01/19/1951  Transition of Care [facility]) CM/SW [place], [place] Phone Number: 01/04/2021, 3:48 PM  Clinical Narrative:   Authorization received from [employer]-reference [id number], 5 days, 7/15-7/19. COVID test ordered.  Awaiting to hear back from [facility] to find out when they can take him. TOC will continue to follow during the course of hospitalization.     Expected Discharge Plan: Skilled Nursing Facility Barriers to Discharge: SNF Pending bed offer  Expected Discharge Plan and Services Expected Discharge Plan: [place]   Discharge Planning Services: CM Consult Post Acute Care Choice: [place] Living arrangements for the past 2 months: Single Family Home                                       Social Determinants of Health (SDOH) Interventions    Readmission Risk Interventions No flowsheet data found.

## 2021-01-05 DIAGNOSIS — I480 Paroxysmal atrial fibrillation: Secondary | ICD-10-CM | POA: Diagnosis not present

## 2021-01-05 DIAGNOSIS — L03116 Cellulitis of left lower limb: Secondary | ICD-10-CM | POA: Diagnosis not present

## 2021-01-05 DIAGNOSIS — I5033 Acute on chronic diastolic (congestive) heart failure: Secondary | ICD-10-CM | POA: Diagnosis not present

## 2021-01-05 LAB — BASIC METABOLIC PANEL
Anion gap: 11 (ref 5–15)
BUN: 28 mg/dL — ABNORMAL HIGH (ref 8–23)
CO2: 27 mmol/L (ref 22–32)
Calcium: 7.9 mg/dL — ABNORMAL LOW (ref 8.9–10.3)
Chloride: 95 mmol/L — ABNORMAL LOW (ref 98–111)
Creatinine, Ser: 0.92 mg/dL (ref 0.61–1.24)
GFR, Estimated: >60 mL/min (ref >60)
Glucose, Bld: 94 mg/dL (ref 70–99)
Potassium: 4.1 mmol/L (ref 3.5–5.1)
Sodium: 133 mmol/L — ABNORMAL LOW (ref 135–145)

## 2021-01-05 LAB — CBC
HCT: 26.3 % — ABNORMAL LOW (ref 39.0–52.0)
Hemoglobin: 7.8 g/dL — ABNORMAL LOW (ref 13.0–17.0)
MCH: 29.8 pg (ref 26.0–34.0)
MCHC: 29.7 g/dL — ABNORMAL LOW (ref 30.0–36.0)
MCV: 100.4 fL — ABNORMAL HIGH (ref 80.0–100.0)
Platelets: 256 10*3/uL (ref 150–400)
RBC: 2.62 MIL/uL — ABNORMAL LOW (ref 4.22–5.81)
RDW: 17.4 % — ABNORMAL HIGH (ref 11.5–15.5)
WBC: 7.9 10*3/uL (ref 4.0–10.5)
nRBC: 0 % (ref 0.0–0.2)

## 2021-01-05 MED ORDER — METOPROLOL TARTRATE 50 MG PO TABS: 50.0000 mg | ORAL_TABLET | Freq: Two times a day (BID) | ORAL | 0 refills | Status: DC

## 2021-01-05 MED ORDER — CEPHALEXIN 500 MG PO CAPS: 500.0000 mg | ORAL_CAPSULE | Freq: Three times a day (TID) | ORAL | 0 refills | Status: AC

## 2021-01-05 MED ORDER — APIXABAN 5 MG PO TABS: 5.0000 mg | ORAL_TABLET | Freq: Two times a day (BID) | ORAL | 0 refills | Status: DC

## 2021-01-05 MED ORDER — LORAZEPAM 1 MG PO TABS: 0.5000 mg | ORAL_TABLET | Freq: Two times a day (BID) | ORAL | 0 refills | Status: AC | PRN

## 2021-01-05 MED ORDER — OXYCODONE-ACETAMINOPHEN 5-325 MG PO TABS: 1.0000 | ORAL_TABLET | Freq: Three times a day (TID) | ORAL | 0 refills | Status: AC | PRN

## 2021-01-05 NOTE — TOC Transition Note (Signed)
Transition of Care [facility]) - CM/SW Discharge Note   Patient Details  Name: Jordan [name] MRN: [id number] Date of Birth: February 05, 1951  Transition of Care [facility]) CM/SW Contact:  Trish Mage, LCSW Phone Number: 01/05/2021, 11:21 AM   Clinical Narrative:   Patient who is stable for d/c will transition to [employer] today.  Family alerted.  PTAR arranged. Nursing, please call report to [phone]. TOC sign off.    Final next level of care: Skilled Nursing Facility Barriers to Discharge: Barriers Resolved   Patient Goals and CMS Choice     Choice offered to / list presented to : Patient  Discharge Placement                       Discharge Plan and Services   Discharge Planning Services: CM Consult Post Acute Care Choice: [place]                               Social Determinants of Health (SDOH) Interventions     Readmission Risk Interventions No flowsheet data found.

## 2021-01-05 NOTE — Plan of Care (Signed)

## 2021-01-05 NOTE — Discharge Summary (Signed)
Physician Discharge Summary  Jordan [name] QIW:[id number] DOB: 03-Jul-1950 DOA: 12/29/2020  PCP: [name], MD  Admit date: 12/29/2020 Discharge date: 01/05/2021  Admitted From: Home Disposition: Home  Recommendations for Outpatient Follow-up:  Follow up with PCP in 1-2 weeks Please obtain BMP/CBC in one week Cardiology will schedule follow-up. Continue wound care.  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  He went to primary care physician's office and was sent to ER. While getting treatment in the hospital, he developed more shortness of breath and fatigue and found to have fluid overload as well developed A. fib that was reportedly new onset. Stabilizing.   Assessment & Plan of care:    # Left leg cellulitis: With history of chronic venous stasis ulcer.  Treated with Rocephin with appropriate improvement.  Continue local wound care.  Received 7 days of Rocephin in the hospital.  Will treat with oral antibiotics on discharge. Seen by wound care team, will continue wound care on discharge. Patient will need twice a week wound care with Unna boot.  Instructions attached.  New onset A. fib: Not previously reported.  Started metoprolol 50 mg twice daily and Eliquis 5 mg twice daily.  Heart rate is fairly controlled now. Patient was on aspirin and Plavix after TAVR.  Is started on Eliquis.  Plavix discontinued.  Cardiology recommended to continue Eliquis and aspirin.  Will need to monitor for any bleeding.  Essential hypertension:  Blood pressure is stable.   GERD: On PPI.  Continue.  Acute on chronic heart failure with preserved ejection fraction: Treated with IV Lasix.  Over diuresed with development of hypotension.  Received some IV fluid with improvement. Patient was also treated for presumed COPD exacerbation with short course of steroids and bronchodilators especially given his history of COPD and smoking.  Adequately improved now.  On Lasix 40 mg daily.  Discharge Diagnoses:  Principal Problem:   Left leg cellulitis Active Problems:   Essential hypertension, benign   HLD (hyperlipidemia)   Chronic venous insufficiency   Tobacco abuse   COPD (chronic obstructive pulmonary disease) (HCC)   Open wound of left foot   Macrocytic anemia   Arthritis   (HFpEF) heart failure with preserved ejection fraction [facility])   Atrial fibrillation [facility])    Discharge Instructions  Discharge Instructions     Call MD for:  difficulty breathing, headache or visual disturbances   Complete by: As directed    Call MD for:  temperature >100.4   Complete by: As directed    Diet - low sodium heart healthy   Complete by: As directed    Discharge wound care:   Complete by: As directed    See unna's boots specific orders Bedside nurses to remove Unnas' boots; wash legs and dry.   Provide topical therapy to the LLE wound (proximal wound); apply 1/4" thick layer of Santyl and cover with dry dressing. Top this and other wound with large silicone foam dressing for padding of the anterior foot (at the bend of the anterior foot). When topical therapy completed; please page ortho tech per the Unna's boots orders to apply Unna's boots.  Increase activity slowly   Complete by: As directed       Allergies as of 01/05/2021       Reactions   Altace [ramipril] Other (See Comments)   Dizziness, migraine, weakness- "there is dye in it"   Codeine Itching   Naproxen Itching        Medication List     STOP taking these medications     atenolol 25 MG tablet Commonly known as: TENORMIN   celecoxib 200 MG capsule Commonly known as: CeleBREX   clopidogrel 75 MG tablet Commonly known as: PLAVIX   hydrALAZINE 50 MG tablet Commonly known as: APRESOLINE   ibuprofen 200 MG tablet Commonly known as: ADVIL       TAKE these medications    apixaban 5 MG Tabs tablet Commonly known as: ELIQUIS Take 1 tablet (5 mg total) by mouth 2 (two) times daily.   aspirin 81 MG tablet Take 81 mg by mouth daily.   atorvastatin 80 MG tablet Commonly known as: LIPITOR Take 80 mg by mouth daily after supper.   budesonide-formoterol 160-4.5 MCG/ACT inhaler Commonly known as: Symbicort Inhale 2 puffs into the lungs in the morning and at bedtime. What changed: when to take this   butalbital-acetaminophen-caffeine 50-325-40 MG tablet Commonly known as: FIORICET Take 1 tablet by mouth 2 (two) times daily as needed for migraine.   cephALEXin 500 MG capsule Commonly known as: KEFLEX Take 1 capsule (500 mg total) by mouth 3 (three) times daily for 5 days.   folic acid 1 MG tablet Commonly known as: FOLVITE Take 1 tablet (1 mg total) by mouth daily.   furosemide 40 MG tablet Commonly known as: LASIX Take 1 tablet (40 mg total) by mouth daily.   ipratropium-albuterol 0.5-2.5 (3) MG/3ML Soln Commonly known as: DUONEB Take 3 mLs by nebulization every 6 (six) hours as needed (shortness of breath/wheezing).   LORazepam 1 MG tablet Commonly known as: ATIVAN Take 0.5 tablets (0.5 mg total) by mouth 2 (two) times daily as needed for up to 5 days for anxiety.   methocarbamol 750 MG tablet Commonly known as: ROBAXIN Take 750 mg by mouth as needed for muscle spasms.   metoprolol tartrate 50 MG tablet Commonly known as: LOPRESSOR Take 1 tablet (50 mg total) by mouth 2 (two) times daily.   montelukast 10 MG tablet Commonly known as: SINGULAIR Take 10 mg by mouth daily.   multivitamin with minerals Tabs tablet Take 1 tablet  by mouth daily.   nystatin cream Commonly known as: MYCOSTATIN Apply 1 application topically 2 (two) times daily.   oxyCODONE-acetaminophen 5-325 MG tablet Commonly known as: Percocet Take 1 tablet by mouth every 8 (eight) hours as needed for up to 5 days. What changed: when to take this   pantoprazole 40 MG tablet Commonly known as: PROTONIX Take 1 tablet (40 mg total) by mouth daily.   promethazine 25 MG tablet Commonly known as: PHENERGAN Take 25 mg by mouth every 8 (eight) hours as needed for nausea/vomiting.   venlafaxine XR 75 MG 24 hr capsule Commonly known as: EFFEXOR-XR Take 75 mg by mouth in the morning and at bedtime. MUST HAVE BRAND NAME ONLY               Discharge Care Instructions  (From admission, onward)           Start     Ordered   01/05/21 0000  Discharge wound care:       Comments: See unna's  boots specific orders Bedside nurses to remove Unnas' boots; wash legs and dry.   Provide topical therapy to the LLE wound (proximal wound); apply 1/4" thick layer of Santyl and cover with dry dressing. Top this and other wound with large silicone foam dressing for padding of the anterior foot (at the bend of the anterior foot). When topical therapy completed; please page ortho tech per the Unna's boots orders to apply Unna's boots.   01/05/21 0757            Contact information for follow-up providers     [facility] Follow up.   Specialty: Cardiology Why: Hospital follow-up with Cardiology scheduled for 02/08/2021 at 10:30am at our [place] location with Laurann Montana, one of our office's NPs. Please arrive 15 minutes early for check-in. If this date/time does not work for you, please call our office to reschedule. Contact information: [address] [place] [address] [phone]             Contact information for after-discharge care     Destination     [facility] Preferred SNF .   Service: Skilled Nursing Contact information: [place] [phone] [phone]                    Allergies  Allergen Reactions   Altace [Ramipril] Other (See Comments)    Dizziness, migraine, weakness- "there is dye in it"   Codeine Itching   Naproxen Itching    Consultations: Cardiology   Procedures/Studies: DG CHEST PORT 1 VIEW  Result Date: 01/02/2021 CLINICAL DATA:  Shortness of breath EXAM: PORTABLE CHEST 1 VIEW COMPARISON:  December 29, 2020 FINDINGS: No pneumothorax. Bilateral layering pleural effusions with underlying opacities and pulmonary edema. The cardiomediastinal silhouette is stable. No other acute abnormalities. IMPRESSION: Cardiomegaly, pulmonary edema, and bilateral pleural effusions. Electronically Signed   By: Dorise Bullion III M.D   On: 01/02/2021 15:11   DG Chest Port 1 View  Result Date: 12/29/2020 CLINICAL DATA:  Concern for sepsis EXAM: PORTABLE CHEST 1 VIEW COMPARISON:  CT 08/06/2020, radiograph 06/26/2020 FINDINGS: There are increasing hazy opacities in the lungs. These partially obscure the costophrenic sulci though difficult to fully assess given collimation of the lung bases due to difficulty with patient positioning. Overall, appearance is increased from comparison prior and could reflect developing edema and or atelectasis with airspace disease less favored though not completely excluded. Pulmonary vascularity is slightly congested. Cardiomediastinal contours may be accentuated by the portable technique. Transcatheter aortic valve replacement. Aortic atherosclerosis. No acute osseous or soft tissue abnormality. Degenerative changes are present in the imaged spine and shoulders. IMPRESSION: Increasing hazy basilar opacities and pulmonary vascular congestion may reflect some developing edema and or atelectasis in the lung bases though underlying airspace disease is difficult to fully  exclude particularly in the setting of possible sepsis. Aortic Atherosclerosis (ICD10-I70.0). Suboptimal imaging given difficulties with patient positioning. Electronically Signed   By: Lovena Le M.D.   On: 12/29/2020 19:59   DG Knee Complete 4 Views Right  Result Date: 12/27/2020 CLINICAL DATA:  Right knee pain following an injury. EXAM: RIGHT KNEE - COMPLETE 4+ VIEW COMPARISON:  None. FINDINGS: Marked lateral joint space narrowing and associated mild irregularity and sclerosis. Moderate medial joint space narrowing. Tricompartmental spur formation. Moderate-sized effusion. No fracture or dislocation seen. Atheromatous arterial calcifications. IMPRESSION: 1. No fracture or dislocation. 2. Moderate-sized effusion. 3. Tricompartmental degenerative changes. Electronically Signed  By: Claudie Revering M.D.   On: 12/27/2020 15:09   DG Foot Complete Left  Result Date: 12/29/2020 CLINICAL DATA:  Left leg pain and swelling, initial encounter EXAM: LEFT FOOT - COMPLETE 3+ VIEW COMPARISON:  04/19/2019, 11/18/2020 FINDINGS: Postsurgical changes are again noted in the medial malleolus stable in appearance from the prior exam. Tarsal degenerative changes are noted. Calcaneal spurring is seen. Flattening of the plantar arch is noted. Soft tissue ulcer is noted along the anterior aspect of the distal lower leg. No other ulcerations are seen. No acute fracture is noted. IMPRESSION: Chronic changes as described. Soft tissue wound is noted along the anterior aspect of the lower left leg. Electronically Signed   By: Inez Catalina M.D.   On: 12/29/2020 20:02   US Abdomen Limited RUQ (LIVER/GB)  Result Date: 12/30/2020 CLINICAL DATA:  70 year old male with history of elevated liver function tests. EXAM: ULTRASOUND ABDOMEN LIMITED RIGHT UPPER QUADRANT COMPARISON:  No priors. FINDINGS: Gallbladder: Gallbladder is moderately distended. No gallstones. Gallbladder wall appears mildly thickened measuring up to 6 mm in diameter. No  pericholecystic fluid. Per report from the sonographer there was no sonographic Murphy's sign on examination. No sonographic Murphy sign noted by sonographer. Common bile duct: Diameter: 4 mm in the porta hepatis. Liver: No focal lesion identified. Within normal limits in parenchymal echogenicity. Portal vein is patent on color Doppler imaging with normal direction of blood flow towards the liver. Other: Right pleural effusion. IMPRESSION: 1. No gallstones. 2. Gallbladder is moderately distended with mild diffuse gallbladder wall thickening measuring up to 6 mm. This is of uncertain etiology and significance, but can be seen in the setting of intrinsic liver disease. Given the lack of a sonographic Murphy's sign, cholecystitis is not favored. 3. No intra or extrahepatic biliary ductal dilatation to suggest biliary tract obstruction. 4. Right pleural effusion. Electronically Signed   By: Vinnie Langton M.D.   On: 12/30/2020 09:26   (Echo, Carotid, EGD, Colonoscopy, ERCP)    Subjective: Patient seen and examined.  Denies any new complaints.  Eager to go to rehab.   Discharge Exam: Vitals:   01/05/21 0514 01/05/21 0725  BP: (!) 124/55   Pulse: 85   Resp: 18   Temp: 98 F (36.7 C)   SpO2: 96% 96%   Vitals:   01/04/21 2010 01/04/21 2012 01/05/21 0514 01/05/21 0725  BP:  137/67 (!) 124/55   Pulse:  78 85   Resp:   18   Temp:  98 F (36.7 C) 98 F (36.7 C)   TempSrc:  Axillary Oral   SpO2: 96% 96% 96% 96%  Weight:      Height:        General: Pt is alert, awake, not in acute distress Patient looks comfortable.  He is on 2 L oxygen that he uses at home. Cardiovascular: Irregularly irregular.  Rate controlled., S1/S2 +, no rubs, no gallops Respiratory: CTA bilaterally, no wheezing, no rhonchi Abdominal: Soft, NT, ND, bowel sounds + Extremities: Bilateral venous stasis ulcers, with compression bandages with Unna boot.    The results of significant diagnostics from this hospitalization  (including imaging, microbiology, ancillary and laboratory) are listed below for reference.     Microbiology: Recent Results (from the past 240 hour(s))  Blood Culture (routine x 2)     Status: None   Collection Time: 12/29/20  6:35 PM   Specimen: BLOOD  Result Value Ref Range Status   Specimen Description   Final  BLOOD RIGHT ANTECUBITAL Performed at [facility] [address], [place], [place] [address]    Special Requests   Final    BOTTLES DRAWN AEROBIC AND ANAEROBIC Blood Culture adequate volume Performed at [facility] [address], [place], [place] [address]    Culture   Final    NO GROWTH 5 DAYS Performed at [facility], [place] [address] [place], [place] [address]    Report Status 01/03/2021 FINAL  Final  Blood Culture (routine x 2)     Status: None   Collection Time: 12/29/20  6:41 PM   Specimen: BLOOD  Result Value Ref Range Status   Specimen Description   Final    BLOOD LEFT ANTECUBITAL Performed at [facility] [address] [place], [place] [address]    Special Requests   Final    BOTTLES DRAWN AEROBIC AND ANAEROBIC Blood Culture adequate volume Performed at [facility] [address] [place], [place] [address]    Culture   Final    NO GROWTH 5 DAYS Performed at [facility], [place] [address], [place], [place] [address]    Report Status 01/03/2021 FINAL  Final  Urine culture     Status: Abnormal   Collection Time: 12/29/20  8:22 PM   Specimen: In/Out Cath Urine  Result Value Ref Range Status   Specimen Description   Final    IN/OUT CATH URINE Performed at [facility] [address], [place], [place] [address]    Special Requests   Final    NONE Performed at [facility], [place] [address] [place], [place] [address]    Culture MULTIPLE SPECIES PRESENT, SUGGEST RECOLLECTION (A)  Final   Report Status 12/31/2020 FINAL   Final  Resp Panel by RT-PCR (Flu A&B, Covid) Nasopharyngeal Swab     Status: None   Collection Time: 12/29/20 10:50 PM   Specimen: Nasopharyngeal Swab; Nasopharyngeal(NP) swabs in vial transport medium  Result Value Ref Range Status   SARS Coronavirus 2 by RT PCR NEGATIVE NEGATIVE Final    Comment: (NOTE) SARS-CoV-2 target nucleic acids are NOT DETECTED.  The SARS-CoV-2 RNA is generally detectable in upper respiratory specimens during the acute phase of infection. The lowest concentration of SARS-CoV-2 viral copies this assay can detect is 138 copies/mL. A negative result does not preclude SARS-Cov-2 infection and should not be used as the sole basis for treatment or other patient management decisions. A negative result may occur with  improper specimen collection/handling, submission of specimen other than nasopharyngeal swab, presence of viral mutation(s) within the areas targeted by this assay, and inadequate number of viral copies(<138 copies/mL). A negative result must be combined with clinical observations, patient history, and epidemiological information. The expected result is Negative.  Fact Sheet for Patients:  EntrepreneurPulse.com.au  Fact Sheet for Healthcare Providers:  IncredibleEmployment.be  This test is no t yet approved or cleared by the [place] FDA and  has been authorized for detection and/or diagnosis of SARS-CoV-2 by FDA under an Emergency Use Authorization (EUA). This EUA will remain  in effect (meaning this test can be used) for the duration of the COVID-19 declaration under Section 564(b)(1) of the Act, 21 U.S.C.section 360bbb-3(b)(1), unless the authorization is terminated  or revoked sooner.       Influenza A by PCR NEGATIVE NEGATIVE Final   Influenza B by PCR NEGATIVE NEGATIVE Final    Comment: (NOTE) The Xpert Xpress  SARS-CoV-2/FLU/RSV plus assay is intended as an aid in the diagnosis of influenza from  Nasopharyngeal swab specimens and should not be used as a sole basis for treatment. Nasal washings and aspirates are unacceptable for Xpert Xpress SARS-CoV-2/FLU/RSV testing.  Fact Sheet for Patients: EntrepreneurPulse.com.au  Fact Sheet for Healthcare Providers: IncredibleEmployment.be  This test is not yet approved or cleared by the [place] FDA and has been authorized for detection and/or diagnosis of SARS-CoV-2 by FDA under an Emergency Use Authorization (EUA). This EUA will remain in effect (meaning this test can be used) for the duration of the COVID-19 declaration under Section 564(b)(1) of the Act, 21 U.S.C. section 360bbb-3(b)(1), unless the authorization is terminated or revoked.  Performed at [facility], [place] [address], [place], [place] [address]   Resp Panel by RT-PCR (Flu A&B, Covid) Nasopharyngeal Swab     Status: None   Collection Time: 01/04/21  5:14 PM   Specimen: Nasopharyngeal Swab; Nasopharyngeal(NP) swabs in vial transport medium  Result Value Ref Range Status   SARS Coronavirus 2 by RT PCR NEGATIVE NEGATIVE Final    Comment: (NOTE) SARS-CoV-2 target nucleic acids are NOT DETECTED.  The SARS-CoV-2 RNA is generally detectable in upper respiratory specimens during the acute phase of infection. The lowest concentration of SARS-CoV-2 viral copies this assay can detect is 138 copies/mL. A negative result does not preclude SARS-Cov-2 infection and should not be used as the sole basis for treatment or other patient management decisions. A negative result may occur with  improper specimen collection/handling, submission of specimen other than nasopharyngeal swab, presence of viral mutation(s) within the areas targeted by this assay, and inadequate number of viral copies(<138 copies/mL). A negative result must be combined with clinical observations, patient history, and epidemiological information. The  expected result is Negative.  Fact Sheet for Patients:  EntrepreneurPulse.com.au  Fact Sheet for Healthcare Providers:  IncredibleEmployment.be  This test is no t yet approved or cleared by the [place] FDA and  has been authorized for detection and/or diagnosis of SARS-CoV-2 by FDA under an Emergency Use Authorization (EUA). This EUA will remain  in effect (meaning this test can be used) for the duration of the COVID-19 declaration under Section 564(b)(1) of the Act, 21 U.S.C.section 360bbb-3(b)(1), unless the authorization is terminated  or revoked sooner.       Influenza A by PCR NEGATIVE NEGATIVE Final   Influenza B by PCR NEGATIVE NEGATIVE Final    Comment: (NOTE) The Xpert Xpress SARS-CoV-2/FLU/RSV plus assay is intended as an aid in the diagnosis of influenza from Nasopharyngeal swab specimens and should not be used as a sole basis for treatment. Nasal washings and aspirates are unacceptable for Xpert Xpress SARS-CoV-2/FLU/RSV testing.  Fact Sheet for Patients: EntrepreneurPulse.com.au  Fact Sheet for Healthcare Providers: IncredibleEmployment.be  This test is not yet approved or cleared by the [place] FDA and has been authorized for detection and/or diagnosis of SARS-CoV-2 by FDA under an Emergency Use Authorization (EUA). This EUA will remain in effect (meaning this test can be used) for the duration of the COVID-19 declaration under Section 564(b)(1) of the Act, 21 U.S.C. section 360bbb-3(b)(1), unless the authorization is terminated or revoked.  Performed at [facility], [place] [address] [place], [place] [address]      Labs: BNP (last 3 results) Recent Labs    03/03/20 1600 12/30/20 0412  BNP 2,454.0* 814.4*   Basic Metabolic Panel: Recent Labs  Lab 12/30/20 0412 12/31/20 0444 01/01/21 0436 01/03/21  1638 01/04/21 0437 01/05/21 0440  NA 135 138  136 134* 133* 133*  K 4.4 4.5 4.7 4.9 4.2 4.1  CL 103 105 104 94* 98 95*  CO2 25 28 25  32 29 27  GLUCOSE 86 84 80 120* 108* 94  BUN 20 22 21  29* 34* 28*  CREATININE 1.13 1.27* 1.11 1.14 1.15 0.92  CALCIUM 8.2* 8.0* 7.9* 8.5* 7.9* 7.9*  MG 2.0  --  1.9  --   --   --   PHOS 3.6  --   --   --   --   --    Liver Function Tests: Recent Labs  Lab 12/29/20 1930 12/30/20 0412 12/31/20 0444  AST 45* 39 31  ALT 22 20 17   ALKPHOS 110 95 88  BILITOT 0.8 1.0 0.6  PROT 5.9* 5.4* 5.2*  ALBUMIN 3.1* 2.8* 2.6*   No results for input(s): LIPASE, AMYLASE in the last 168 hours. No results for input(s): AMMONIA in the last 168 hours. CBC: Recent Labs  Lab 12/29/20 1930 12/30/20 0412 12/31/20 0444 01/01/21 0436 01/03/21 0539 01/04/21 0437 01/05/21 0440  WBC 7.5   < > 6.9 7.8 6.2 7.9 7.9  NEUTROABS 6.1  --   --   --   --   --   --   HGB 8.4*   < > 7.4* 7.8* 9.0* 8.0* 7.8*  HCT 27.2*   < > 24.1* 26.2* 29.1* 25.8* 26.3*  MCV 100.7*   < > 100.8* 103.1* 99.7 97.7 100.4*  PLT 247   < > 229 171 256 242 256   < > = values in this interval not displayed.   Cardiac Enzymes: No results for input(s): CKTOTAL, CKMB, CKMBINDEX, TROPONINI in the last 168 hours. BNP: Invalid input(s): POCBNP CBG: No results for input(s): GLUCAP in the last 168 hours. D-Dimer No results for input(s): DDIMER in the last 72 hours. Hgb A1c No results for input(s): HGBA1C in the last 72 hours. Lipid Profile No results for input(s): CHOL, HDL, LDLCALC, TRIG, CHOLHDL, LDLDIRECT in the last 72 hours. Thyroid function studies No results for input(s): TSH, T4TOTAL, T3FREE, THYROIDAB in the last 72 hours.  Invalid input(s): FREET3 Anemia work up No results for input(s): VITAMINB12, FOLATE, FERRITIN, TIBC, IRON, RETICCTPCT in the last 72 hours. Urinalysis    Component Value Date/Time   COLORURINE YELLOW 12/29/2020 2022   APPEARANCEUR CLEAR 12/29/2020 2022   LABSPEC 1.006 12/29/2020 2022   PHURINE 5.0 12/29/2020  2022   GLUCOSEU NEGATIVE 12/29/2020 2022   HGBUR NEGATIVE 12/29/2020 2022   BILIRUBINUR NEGATIVE 12/29/2020 2022   KETONESUR NEGATIVE 12/29/2020 2022   PROTEINUR NEGATIVE 12/29/2020 2022   NITRITE NEGATIVE 12/29/2020 2022   LEUKOCYTESUR NEGATIVE 12/29/2020 2022   Sepsis Labs Invalid input(s): PROCALCITONIN,  WBC,  LACTICIDVEN Microbiology Recent Results (from the past 240 hour(s))  Blood Culture (routine x 2)     Status: None   Collection Time: 12/29/20  6:35 PM   Specimen: BLOOD  Result Value Ref Range Status   Specimen Description   Final    BLOOD RIGHT ANTECUBITAL Performed at [facility], [place] [address] [place], [place] [address]    Special Requests   Final    BOTTLES DRAWN AEROBIC AND ANAEROBIC Blood Culture adequate volume Performed at [facility] [address], [place], [place] [address]    Culture   Final    NO GROWTH 5 DAYS Performed at [facility], [place],  [place] [address]    Report Status 01/03/2021 FINAL  Final  Blood Culture (routine x 2)     Status: None   Collection Time: 12/29/20  6:41 PM   Specimen: BLOOD  Result Value Ref Range Status   Specimen Description   Final    BLOOD LEFT ANTECUBITAL Performed at [place] [address], [place], [place] [address]    Special Requests   Final    BOTTLES DRAWN AEROBIC AND ANAEROBIC Blood Culture adequate volume Performed at [facility] [address] [place], [place] [address]    Culture   Final    NO GROWTH 5 DAYS Performed at [facility], [place] [address] [place], [place] [address]    Report Status 01/03/2021 FINAL  Final  Urine culture     Status: Abnormal   Collection Time: 12/29/20  8:22 PM   Specimen: In/Out Cath Urine  Result Value Ref Range Status   Specimen Description   Final    IN/OUT CATH URINE Performed at [facility] [address] [place], [place]  [address]    Special Requests   Final    NONE Performed at [facility], [place] [address], [place], [place] [address]    Culture MULTIPLE SPECIES PRESENT, SUGGEST RECOLLECTION (A)  Final   Report Status 12/31/2020 FINAL  Final  Resp Panel by RT-PCR (Flu A&B, Covid) Nasopharyngeal Swab     Status: None   Collection Time: 12/29/20 10:50 PM   Specimen: Nasopharyngeal Swab; Nasopharyngeal(NP) swabs in vial transport medium  Result Value Ref Range Status   SARS Coronavirus 2 by RT PCR NEGATIVE NEGATIVE Final    Comment: (NOTE) SARS-CoV-2 target nucleic acids are NOT DETECTED.  The SARS-CoV-2 RNA is generally detectable in upper respiratory specimens during the acute phase of infection. The lowest concentration of SARS-CoV-2 viral copies this assay can detect is 138 copies/mL. A negative result does not preclude SARS-Cov-2 infection and should not be used as the sole basis for treatment or other patient management decisions. A negative result may occur with  improper specimen collection/handling, submission of specimen other than nasopharyngeal swab, presence of viral mutation(s) within the areas targeted by this assay, and inadequate number of viral copies(<138 copies/mL). A negative result must be combined with clinical observations, patient history, and epidemiological information. The expected result is Negative.  Fact Sheet for Patients:  EntrepreneurPulse.com.au  Fact Sheet for Healthcare Providers:  IncredibleEmployment.be  This test is no t yet approved or cleared by the [place] FDA and  has been authorized for detection and/or diagnosis of SARS-CoV-2 by FDA under an Emergency Use Authorization (EUA). This EUA will remain  in effect (meaning this test can be used) for the duration of the COVID-19 declaration under Section 564(b)(1) of the Act, 21 U.S.C.section 360bbb-3(b)(1), unless the authorization is terminated  or  revoked sooner.       Influenza A by PCR NEGATIVE NEGATIVE Final   Influenza B by PCR NEGATIVE NEGATIVE Final    Comment: (NOTE) The Xpert Xpress SARS-CoV-2/FLU/RSV plus assay is intended as an aid in the diagnosis of influenza from Nasopharyngeal swab specimens and should not be used as a sole basis for treatment. Nasal washings and aspirates are unacceptable for Xpert Xpress SARS-CoV-2/FLU/RSV testing.  Fact Sheet for Patients: EntrepreneurPulse.com.au  Fact Sheet for Healthcare Providers: IncredibleEmployment.be  This test is not yet approved or cleared by the [place] FDA and has been authorized for detection and/or  diagnosis of SARS-CoV-2 by FDA under an Emergency Use Authorization (EUA). This EUA will remain in effect (meaning this test can be used) for the duration of the COVID-19 declaration under Section 564(b)(1) of the Act, 21 U.S.C. section 360bbb-3(b)(1), unless the authorization is terminated or revoked.  Performed at [facility], [place] [address] [place], [place] [address]   Resp Panel by RT-PCR (Flu A&B, Covid) Nasopharyngeal Swab     Status: None   Collection Time: 01/04/21  5:14 PM   Specimen: Nasopharyngeal Swab; Nasopharyngeal(NP) swabs in vial transport medium  Result Value Ref Range Status   SARS Coronavirus 2 by RT PCR NEGATIVE NEGATIVE Final    Comment: (NOTE) SARS-CoV-2 target nucleic acids are NOT DETECTED.  The SARS-CoV-2 RNA is generally detectable in upper respiratory specimens during the acute phase of infection. The lowest concentration of SARS-CoV-2 viral copies this assay can detect is 138 copies/mL. A negative result does not preclude SARS-Cov-2 infection and should not be used as the sole basis for treatment or other patient management decisions. A negative result may occur with  improper specimen collection/handling, submission of specimen other than nasopharyngeal swab,  presence of viral mutation(s) within the areas targeted by this assay, and inadequate number of viral copies(<138 copies/mL). A negative result must be combined with clinical observations, patient history, and epidemiological information. The expected result is Negative.  Fact Sheet for Patients:  EntrepreneurPulse.com.au  Fact Sheet for Healthcare Providers:  IncredibleEmployment.be  This test is no t yet approved or cleared by the [place] FDA and  has been authorized for detection and/or diagnosis of SARS-CoV-2 by FDA under an Emergency Use Authorization (EUA). This EUA will remain  in effect (meaning this test can be used) for the duration of the COVID-19 declaration under Section 564(b)(1) of the Act, 21 U.S.C.section 360bbb-3(b)(1), unless the authorization is terminated  or revoked sooner.       Influenza A by PCR NEGATIVE NEGATIVE Final   Influenza B by PCR NEGATIVE NEGATIVE Final    Comment: (NOTE) The Xpert Xpress SARS-CoV-2/FLU/RSV plus assay is intended as an aid in the diagnosis of influenza from Nasopharyngeal swab specimens and should not be used as a sole basis for treatment. Nasal washings and aspirates are unacceptable for Xpert Xpress SARS-CoV-2/FLU/RSV testing.  Fact Sheet for Patients: EntrepreneurPulse.com.au  Fact Sheet for Healthcare Providers: IncredibleEmployment.be  This test is not yet approved or cleared by the [place] FDA and has been authorized for detection and/or diagnosis of SARS-CoV-2 by FDA under an Emergency Use Authorization (EUA). This EUA will remain in effect (meaning this test can be used) for the duration of the COVID-19 declaration under Section 564(b)(1) of the Act, 21 U.S.C. section 360bbb-3(b)(1), unless the authorization is terminated or revoked.  Performed at [facility], [place] [address], [place], [place] [address]       Time coordinating discharge:  35 minutes  SIGNED:   Barb Merino, MD  [facility] 01/05/2021, 10:11 AM

## 2021-01-05 NOTE — Progress Notes (Signed)
Orthopedic Tech Progress Note Patient Details:  Jordan [name] 1951-03-11 [id number]  Patient ID: Jordan [name], male   DOB: 12-Jul-1950, 70 y.o.   MRN: [id number]  Jordan [name] 01/05/2021, 11:03 AM Bi lat unna boot applied

## 2021-01-05 NOTE — Progress Notes (Signed)
Pt. Discharged via EMS, pt. Is alert and oriented aware of the transfer to [facility]. Discharge instruction and education discussed with patient, all personal belongings are with patient. Vital take, stable for transfer.

## 2021-01-05 NOTE — Progress Notes (Signed)
Patient ID: KHALEE MAZO, male   DOB: [birth year], 70 y.o.   MRN: [id number]    Progress Note from the Palliative Medicine Team at [facility]   Patient Name: Jordan [name]        Date: 01/05/2021 DOB: 1950-11-07  Age: 70 y.o. MRN#: [id number] Attending Physician: Barb Merino, MD Primary Care Physician: [name], MD Admit Date: 12/29/2020   Medical records reviewed   Jordan [name] is a 70 y.o. 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 He presented to his PCP on 7/12 with c/o LLE pain and EMS was contacted. Pt was transferred to ED and was admitted on 12/29/2020 with left leg swelling, redness, and pain and subsequently treated with abx for LLE cellulitis. During hospitalization, he was found to be in AFib unctonrolled and pt placed on Eliquis.   Patient has had continued physical and functional decline over the past 6 months.  He faces treatment option decisions, advanced directive decisions and anticipatory care needs.  This NP visited patient at the bedside as a follow up to  yesterday's [place] for palliative medicine needs and emotional support.    Jordan [name] "feels good today" and is ready for transition to SNF for rehab. Likely today.   Discussed with patient the importance of continued conversation with family and their  medical providers regarding overall plan of care and treatment options,  ensuring decisions are within the context of the patients values and GOCs.  Education again offered regarding ACP documents, paged chaplain; await callback.   Recommend OP community based Palliative services on discharge.    Questions and concerns addressed   Discussed  with Dr Sloan Leiter and bedside RN   Total time spent on the unit was 20 minutes  Greater than 50% of the time was spent in counseling and coordination of care  Jordan Lessen NP  Palliative Medicine Team Team Phone # [phone] Pager [phone]

## 2021-01-05 NOTE — Progress Notes (Signed)
Report given, RN from [employer] received the report, question answered and verified. Pt. Was updated about  the transfer.

## 2021-01-05 NOTE — Progress Notes (Signed)
Progress Note  Patient Name: Jordan [name] Date of Encounter: 01/05/2021  [facility] Cardiologist: Candee Furbish, MD   Subjective   No significant overnight events. No chest pain, shortness of breath, significant palpitations. No recurrent lightheadedness/dizziness. No abnormal bleeding noted in urine or stools.  Inpatient Medications    Scheduled Meds:  apixaban  5 mg Oral BID   aspirin  81 mg Oral Daily   atorvastatin  80 mg Oral Daily   collagenase   Topical Daily   folic acid  1 mg Oral Daily   furosemide  40 mg Oral Daily   ipratropium-albuterol  3 mL Nebulization BID   metoprolol tartrate  50 mg Oral BID   pantoprazole  40 mg Oral Daily   predniSONE  50 mg Oral Q breakfast   tamsulosin  0.4 mg Oral Daily   venlafaxine XR  75 mg Oral Q breakfast   Continuous Infusions:  cefTRIAXone (ROCEPHIN)  IV 2 g (01/04/21 2205)   PRN Meds: acetaminophen, ipratropium-albuterol, LORazepam, methocarbamol, oxyCODONE-acetaminophen, promethazine   Vital Signs    Vitals:   01/04/21 1249 01/04/21 2010 01/04/21 2012 01/05/21 0514  BP: 130/72  137/67 (!) 124/55  Pulse: 86  78 85  Resp: 19   18  Temp: 97.6 F (36.4 C)  98 F (36.7 C) 98 F (36.7 C)  TempSrc:   Axillary Oral  SpO2: 97% 96% 96% 96%  Weight:      Height:        Intake/Output Summary (Last 24 hours) at 01/05/2021 0719 Last data filed at 01/05/2021 0347 Gross per 24 hour  Intake 708 ml  Output 2050 ml  Net -1342 ml   Last 3 Weights 12/29/2020 12/27/2020 07/23/2020  Weight (lbs) 225 lb 184 lb 15.5 oz 185 lb  Weight (kg) 102.059 kg 83.9 kg 83.915 kg      Telemetry    Atrial fibrillation with rates mostly in the 70s to 80s.- Personally Reviewed  ECG    No new ECG tracings today. - Personally Reviewed  Physical Exam   GEN: No acute distress.   Neck: No JVD. Cardiac: Irregularly irregular rhythm with normal rate. No murmurs, rubs, or gallops. Radial pulses 2+ and equal bilaterally. Respiratory: No  increased work of breathing. Very faint expiratory wheeze noted but no crackles or rales. GI: Soft, non-distended, and non-tender.  MS: Edema of left lower extremity, especially around ankle (patient reports this is chronic from prior injury. Both legs wrapped in ACE bandage. Skin: Warm and dry. Neuro:  No focal deficits.  Psych: Normal affect. Responds appropriately.  Labs    High Sensitivity Troponin:  No results for input(s): TROPONINIHS in the last 720 hours.    Chemistry Recent Labs  Lab 12/29/20 1930 12/30/20 0412 12/31/20 0444 01/01/21 0436 01/03/21 0539 01/04/21 0437 01/05/21 0440  NA 133* 135 138   < > 134* 133* 133*  K 4.3 4.4 4.5   < > 4.9 4.2 4.1  CL 102 103 105   < > 94* 98 95*  CO2 24 25 28    < > 32 29 27  GLUCOSE 94 86 84   < > 120* 108* 94  BUN 23 20 22    < > 29* 34* 28*  CREATININE 1.38* 1.13 1.27*   < > 1.14 1.15 0.92  CALCIUM 8.4* 8.2* 8.0*   < > 8.5* 7.9* 7.9*  PROT 5.9* 5.4* 5.2*  --   --   --   --   ALBUMIN 3.1* 2.8*  2.6*  --   --   --   --   AST 45* 39 31  --   --   --   --   ALT 22 20 17   --   --   --   --   ALKPHOS 110 95 88  --   --   --   --   BILITOT 0.8 1.0 0.6  --   --   --   --   GFRNONAA 55* >60 >60   < > >60 >60 >60  ANIONGAP 7 7 5    < > 8 6 11    < > = values in this interval not displayed.     Hematology Recent Labs  Lab 01/03/21 0539 01/04/21 0437 01/05/21 0440  WBC 6.2 7.9 7.9  RBC 2.92* 2.64* 2.62*  HGB 9.0* 8.0* 7.8*  HCT 29.1* 25.8* 26.3*  MCV 99.7 97.7 100.4*  MCH 30.8 30.3 29.8  MCHC 30.9 31.0 29.7*  RDW 16.7* 17.1* 17.4*  PLT 256 242 256    BNP Recent Labs  Lab 12/30/20 0412  BNP 805.0*     DDimer No results for input(s): DDIMER in the last 168 hours.   Radiology    No results found.  Cardiac Studies   Right/Left Cardiac Catheterization 03/06/2020: 1.  Nonobstructive coronary artery disease with mild nonobstructive plaquing in the RCA, wide patency of the left main, wide patency of the LAD, and mild to  moderate mid circumflex stenosis 2.  Moderate aortic stenosis with mean gradient 26 mmHg and calculated aortic valve area 1.6 cm. 3.  Moderate pulmonary hypertension with mean PA pressure 34 mmHg, transpulmonary gradient 13 mmHg, PVR less than 2 [place] units   Diagnostic Dominance: Right      _______________   Echocardiogram 07/23/2020: Impressions: 1. Left ventricular ejection fraction, by estimation, is 60 to 65%. The  left ventricle has normal function. The left ventricle has no regional  wall motion abnormalities. The left ventricular internal cavity size was  mildly to moderately dilated. There  is mild concentric left ventricular hypertrophy. Left ventricular  diastolic parameters are consistent with Grade II diastolic dysfunction  (pseudonormalization).   2. Right ventricular systolic function is normal. The right ventricular  size is normal. There is mildly elevated pulmonary artery systolic  pressure.   3. Left atrial size was moderately dilated.   4. Right atrial size was moderately dilated.   5. The mitral valve is degenerative. Trivial mitral valve regurgitation.   6. Perivalvular leak at the native [place] commissure.. The aortic valve  has been repaired/replaced. Aortic valve regurgitation is mild to  moderate. There is a 26 mm Sapien prosthetic (TAVR) valve present in the  aortic position. Procedure Date:  06/30/2020. Aortic valve mean gradient measures 14.6 mmHg.   7. The inferior vena cava is dilated in size with >50% respiratory  variability, suggesting right atrial pressure of 8 mmHg.   Comparison(s): Prior images reviewed side by side.   Conclusion(s)/Recommendation(s): S/P TAVR, appears well seated. Mean gradient 15 mmHg, prior was 19 mmHg on most recent echo. Perivalvular leak is more prominent on current study, mild to moderate based on images and PHT.  Patient Profile     70 y.o. male with a history of mild non-obstructive CAD noted on cardiac  catheterization in 02/2020, severe aortic stenosis s/p TAVR in 01/2499, chronic diastolic CHF, paroxsymal atrial fibrillation on Eliquis, COPD with ongoing tobacco use, chronic venous insufficiency, hypertension, hyperlipidemia, diabetes, anxiety and depression who presented to  the ED on 12/30/2020 with lower extremity swelling with pain and redness and was admitted for cellulitis and started on IV antibiotics. Found to be in new onset rate controlled atrial fibrillation on admission for which Cardiology was consulted.  Assessment & Plan    New Onset Atrial Fibrillation - Rates in the 70 to 53s.  - Electrolytes normal. - Echo from 07/2020 showed normal LV function. - Continue Lopressor 50mg  twice daily. - CHA2DS2-VASc = 5 (CHF, CAD, HTN, DM, age x1). Started on Eliquis 5mg  twice daily. Continue. Will need to continue to watch H/H closely.   Chronic Diastolic CHF - BNP 212 on admission. - Chest x-ray on 7/16 showed cardiomegaly with pulmonary edema and bilateral pleural effusions. - Echo in 07/2020 showed LVEF of 60-65% with grade 2 diastolic dysfunction. - He received one dose of IV Lasix on 7/16 after RN reported crackles and then was restarted on home PO Lasix 40mg  daily. Net negative 5.18 L this admission. No updated weights since admission. Renal function stable. - Appears euvolemic on exam. - Continue PO Lasix 40mg  daily. - Will continue to monitor daily weights closely.   Non-Obstructive CAD - Noted on cardiac catheterization in 02/2020. - No angina. - Continue aspirin and beta-blocker. - Home Lipitor 80mg  daily was initially held on admission to to slightly elevated LFTs which have normalized. Restarted on 7/18.    Aortic Stenosis s/p TAVR in 06/2020 - Post-op Echo in 07/2020 showed well seated TAVR with perivalvular leak at the native [place] commissure and mild to moderate AI. Mean gradient 14.6 mmHg (19 mmHg on prior Echo). Perivalvular leak more prominent compared to prior study.    Hypertension - BP soft at times but stable. Systolic BP in the 80 one time yesterday but has since improved. Systolic BP in the 248G this morning. Patient did have some lightheadedness/dizziness when BP was in the 80s yesterday but this has now resolved.  - Continue Lopressor 50mg  twice daily. - Home Hydralazine discontinued on 7/18 to allow for more rate control.   Chronic Venous Insufficiency Lower Extremity Cellulitis - Still being treated with IV Rocephin. - Leg are currently wrapped. - Management per primary team.   Hyperlipidemia - Home Lipitor 80mg  daily was initially held on admission to to slightly elevated LFTs which have normalized. Restarted on 7/18.   Type Diabetes Mellitus - Management per primary team.   COPD - Started on Prednisone. - Management per primary team.   Macrocytic Anemia - Hemoglobin as low as 7.3 on admission. Hemoglobin 7.8 today. Baseline earlier this year was round 9-10. - Vitmain B12 >7,500. Of note, he recently got a B12 shot. - Folate normal. - Patient denies any bleeding in urine or stools and no melena. Consider hemoccult.  - Management per primary team.  Disposition: Patient stable from a cardiac standpoint. OK for discharge to SNF from cardiac standpoint. Will need repeat CBC to monitor hemoglobin at SNF. MD to follow with any additional recommendations.  For questions or updates, please contact [place] Please consult [email or web address] for contact info under        Signed, Darreld Mclean, PA-C  01/05/2021, 7:19 AM

## 2021-02-08 ENCOUNTER — Ambulatory Visit (HOSPITAL_BASED_OUTPATIENT_CLINIC_OR_DEPARTMENT_OTHER): Payer: Medicare PPO | Admitting: Family

## 2021-02-08 NOTE — Progress Notes (Deleted)
Office Visit    Patient Name: Jordan [name] Date of Encounter: 02/08/2021  PCP:  [name], [place]  Cardiologist:  Candee Furbish, MD  Advanced Practice Provider:  No care team member to display Electrophysiologist:  None      Chief Complaint    Jordan [name] is a 70 y.o. 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 Burt Knack and Dr. Cyndia Bent   Severe aortic stenosis    Severe aortic insufficiency, moderate to severe aortic stenosis   Sleep apnea    CPAP    Venous insufficiency    managed by ortho (Dr. Sharol Given)   Past Surgical History:  Procedure Laterality Date   ANKLE SURGERY Right    Dr. Marcelino Scot, has pins   COLONOSCOPY W/ POLYPECTOMY     KNEE ARTHROSCOPY Right    Dr. Noemi Chapel   MULTIPLE EXTRACTIONS WITH ALVEOLOPLASTY Bilateral 05/22/2020   Procedure: MULTIPLE EXTRACTION WITH ALVEOLOPLASTY;  Surgeon: Diona Browner, DMD;  Location: [facility];  Service: Oral Surgery;  Laterality: Bilateral;   right foot surgery,rt great toe callus removal     RIGHT/LEFT HEART CATH AND CORONARY ANGIOGRAPHY N/A 03/06/2020   Procedure: RIGHT/LEFT HEART CATH AND CORONARY ANGIOGRAPHY;  Surgeon: Sherren Mocha, MD;  Location: [facility];  Service: Cardiovascular;  Laterality: N/A;   TEE WITHOUT CARDIOVERSION N/A 03/09/2020   Procedure: TRANSESOPHAGEAL ECHOCARDIOGRAM (TEE);  Surgeon:  Dorothy Spark, MD;  Location: [facility];  Service: Cardiovascular;  Laterality: N/A;   TEE WITHOUT CARDIOVERSION N/A 06/30/2020   Procedure: TRANSESOPHAGEAL ECHOCARDIOGRAM (TEE);  Surgeon: Sherren Mocha, MD;  Location: [facility];  Service: Open Heart Surgery;  Laterality: N/A;   TONSILLECTOMY AND ADENOIDECTOMY     age 30   TRANSCATHETER AORTIC VALVE REPLACEMENT, TRANSFEMORAL N/A 06/30/2020   Procedure: TRANSCATHETER AORTIC VALVE REPLACEMENT, TRANSFEMORAL;  Surgeon: Sherren Mocha, MD;  Location: [facility];  Service: Open Heart Surgery;  Laterality: N/A;    Allergies  Allergies  Allergen Reactions   Altace [Ramipril] Other (See Comments)    Dizziness, migraine, weakness- "there is dye in it"   Codeine Itching   Naproxen Itching    History of Present Illness    Jordan [name] is a 70 y.o. male with a hx of aortic stenosis s/p TAVR 06/2020, HFpEF, hypertension, hyperlipidemia, chronic venous insufficiency with bilateral lower extremity edema and venous stasis ulcers, COPD with ongoing tobacco use last seen while hospitalized.  *** Admitted 12/29/2020 - 01/05/2021 after presenting with cellulitis.  He was also found to have fluid overload and developed atrial fibrillation that was new onset.  He received 7 days of IV Rocephin in the hospital and was discharged on oral antibiotics.  Unna boot was placed.  In regards atrial fibrillation  he was started on metoprolol 50 mg twice daily and Eliquis 5 mg twice daily.  His heart rate was well controlled.  Plavix was discontinued in the setting of anticoagulation.  Aspirin was continued given previous TAVR.  He presents today for follow-up. ***   EKGs/Labs/Other Studies Reviewed:   The following studies were reviewed today: ***  EKG:  EKG is ordered today.  The ekg ordered today demonstrates ***  Recent Labs: 12/30/2020: B Natriuretic Peptide 805.0 12/31/2020: ALT 17 01/01/2021: Magnesium 1.9 01/05/2021: BUN 28; Creatinine, Ser 0.92;  Hemoglobin 7.8; Platelets 256; Potassium 4.1; Sodium 133  Recent Lipid Panel    Component Value Date/Time   CHOL 108 03/06/2020 0508   TRIG 68 03/06/2020 0508   HDL 30 (L) 03/06/2020 0508   CHOLHDL 3.6 03/06/2020 0508   VLDL 14 03/06/2020 0508   LDLCALC 64 03/06/2020 0508    Risk Assessment/Calculations:  {Does this patient have ATRIAL FIBRILLATION?[phone]}  Home Medications   No outpatient medications have been marked as taking for the 02/08/21 encounter (Appointment) with Loel Dubonnet, NP.     Review of Systems      All other systems reviewed and are otherwise negative except as noted above.  Physical Exam    VS:  There were no vitals taken for this visit. , BMI There is no height or weight on file to calculate BMI.  Wt Readings from Last 3 Encounters:  12/29/20 225 lb (102.1 kg)  12/27/20 184 lb 15.5 oz (83.9 kg)  07/23/20 185 lb (83.9 kg)     GEN: Well nourished, well developed, in no acute distress. HEENT: normal. Neck: Supple, no JVD, carotid bruits, or masses. Cardiac: ***RRR, no murmurs, rubs, or gallops. No clubbing, cyanosis, edema.  ***Radials/PT 2+ and equal bilaterally.  Respiratory:  ***Respirations regular and unlabored, clear to auscultation bilaterally. GI: Soft, nontender, nondistended. MS: No deformity or atrophy. Skin: Warm and dry, no rash. Neuro:  Strength and sensation are intact. Psych: Normal affect.  Assessment & Plan    Atrial fibrillation/chronic anticoagulation-  AS s/p TAVR -   HTN -   HFpEF -   GERD -   Disposition: Follow up {follow up:15908} with Dr. Marlou Porch or APP.  Signed, Loel Dubonnet, NP 02/08/2021, 8:56 AM [place]

## 2021-02-18 ENCOUNTER — Telehealth: Payer: Self-pay | Admitting: Orthopedic Surgery

## 2021-02-18 NOTE — Telephone Encounter (Signed)
Called to give verbal ok for orders below also advised that the pt has an appt on 02/23/21 and if there are any changes to his wound care I will call to advise.

## 2021-02-18 NOTE — Telephone Encounter (Signed)
Kim from [facility] called. She is the nurse working with patient. She would like a verbal order for [employer]. Her call back number is [phone]

## 2021-02-19 ENCOUNTER — Telehealth: Payer: Self-pay | Admitting: Orthopedic Surgery

## 2021-02-19 NOTE — Telephone Encounter (Signed)
I called and Jordan [name] and she  advised that order has been placed.

## 2021-02-19 NOTE — Telephone Encounter (Signed)
Jordan [name] with [facility] had an emergency and didn't input the orders, Jordan [name] would like a CB to confirm these orders so she can put it in please.   Jordan [name] [phone]

## 2021-02-19 NOTE — Telephone Encounter (Signed)
Denise from [facility] called. Just wanted to make sure verbal orders for Unna boots was put in. Her call back number is [phone]

## 2021-02-22 ENCOUNTER — Other Ambulatory Visit: Payer: Self-pay | Admitting: Physician Assistant

## 2021-02-23 ENCOUNTER — Other Ambulatory Visit: Payer: Self-pay

## 2021-02-23 ENCOUNTER — Inpatient Hospital Stay (HOSPITAL_COMMUNITY)
Admission: EM | Admit: 2021-02-23 | Discharge: 2021-02-27 | DRG: 309 | Disposition: A | Discharge status: Home Health | Payer: Medicare PPO | Attending: Internal Medicine | Admitting: Internal Medicine

## 2021-02-23 ENCOUNTER — Ambulatory Visit: Payer: Medicare PPO | Admitting: Orthopedic Surgery

## 2021-02-23 ENCOUNTER — Emergency Department (HOSPITAL_COMMUNITY): Payer: Medicare PPO

## 2021-02-23 ENCOUNTER — Encounter (HOSPITAL_COMMUNITY): Payer: Self-pay | Admitting: Family Medicine

## 2021-02-23 DIAGNOSIS — I4891 Unspecified atrial fibrillation: Secondary | ICD-10-CM | POA: Diagnosis not present

## 2021-02-23 DIAGNOSIS — Z833 Family history of diabetes mellitus: Secondary | ICD-10-CM

## 2021-02-23 DIAGNOSIS — F32A Depression, unspecified: Secondary | ICD-10-CM | POA: Diagnosis present

## 2021-02-23 DIAGNOSIS — F419 Anxiety disorder, unspecified: Secondary | ICD-10-CM | POA: Diagnosis present

## 2021-02-23 DIAGNOSIS — R0602 Shortness of breath: Secondary | ICD-10-CM

## 2021-02-23 DIAGNOSIS — E119 Type 2 diabetes mellitus without complications: Secondary | ICD-10-CM

## 2021-02-23 DIAGNOSIS — I5032 Chronic diastolic (congestive) heart failure: Secondary | ICD-10-CM | POA: Diagnosis present

## 2021-02-23 DIAGNOSIS — I5030 Unspecified diastolic (congestive) heart failure: Secondary | ICD-10-CM | POA: Diagnosis present

## 2021-02-23 DIAGNOSIS — Z7901 Long term (current) use of anticoagulants: Secondary | ICD-10-CM

## 2021-02-23 DIAGNOSIS — I2510 Atherosclerotic heart disease of native coronary artery without angina pectoris: Secondary | ICD-10-CM | POA: Diagnosis present

## 2021-02-23 DIAGNOSIS — I480 Paroxysmal atrial fibrillation: Principal | ICD-10-CM | POA: Diagnosis present

## 2021-02-23 DIAGNOSIS — Z9981 Dependence on supplemental oxygen: Secondary | ICD-10-CM

## 2021-02-23 DIAGNOSIS — M1710 Unilateral primary osteoarthritis, unspecified knee: Secondary | ICD-10-CM | POA: Diagnosis present

## 2021-02-23 DIAGNOSIS — E669 Obesity, unspecified: Secondary | ICD-10-CM | POA: Diagnosis present

## 2021-02-23 DIAGNOSIS — Z952 Presence of prosthetic heart valve: Secondary | ICD-10-CM | POA: Diagnosis not present

## 2021-02-23 DIAGNOSIS — G4730 Sleep apnea, unspecified: Secondary | ICD-10-CM | POA: Diagnosis present

## 2021-02-23 DIAGNOSIS — R7989 Other specified abnormal findings of blood chemistry: Secondary | ICD-10-CM

## 2021-02-23 DIAGNOSIS — D649 Anemia, unspecified: Secondary | ICD-10-CM | POA: Diagnosis present

## 2021-02-23 DIAGNOSIS — J449 Chronic obstructive pulmonary disease, unspecified: Secondary | ICD-10-CM | POA: Diagnosis present

## 2021-02-23 DIAGNOSIS — I872 Venous insufficiency (chronic) (peripheral): Secondary | ICD-10-CM | POA: Diagnosis present

## 2021-02-23 DIAGNOSIS — Z20822 Contact with and (suspected) exposure to covid-19: Secondary | ICD-10-CM | POA: Diagnosis present

## 2021-02-23 DIAGNOSIS — Z7982 Long term (current) use of aspirin: Secondary | ICD-10-CM

## 2021-02-23 DIAGNOSIS — F17210 Nicotine dependence, cigarettes, uncomplicated: Secondary | ICD-10-CM | POA: Diagnosis present

## 2021-02-23 DIAGNOSIS — Z23 Encounter for immunization: Secondary | ICD-10-CM | POA: Diagnosis present

## 2021-02-23 DIAGNOSIS — K219 Gastro-esophageal reflux disease without esophagitis: Secondary | ICD-10-CM | POA: Diagnosis present

## 2021-02-23 DIAGNOSIS — I878 Other specified disorders of veins: Secondary | ICD-10-CM | POA: Diagnosis present

## 2021-02-23 DIAGNOSIS — J9611 Chronic respiratory failure with hypoxia: Secondary | ICD-10-CM | POA: Diagnosis present

## 2021-02-23 DIAGNOSIS — M47812 Spondylosis without myelopathy or radiculopathy, cervical region: Secondary | ICD-10-CM | POA: Diagnosis present

## 2021-02-23 DIAGNOSIS — Z6822 Body mass index (BMI) 22.0-22.9, adult: Secondary | ICD-10-CM

## 2021-02-23 DIAGNOSIS — E785 Hyperlipidemia, unspecified: Secondary | ICD-10-CM | POA: Diagnosis present

## 2021-02-23 DIAGNOSIS — Z7951 Long term (current) use of inhaled steroids: Secondary | ICD-10-CM

## 2021-02-23 DIAGNOSIS — Z885 Allergy status to narcotic agent status: Secondary | ICD-10-CM

## 2021-02-23 DIAGNOSIS — I352 Nonrheumatic aortic (valve) stenosis with insufficiency: Secondary | ICD-10-CM | POA: Diagnosis present

## 2021-02-23 DIAGNOSIS — Z79899 Other long term (current) drug therapy: Secondary | ICD-10-CM

## 2021-02-23 DIAGNOSIS — I110 Hypertensive heart disease with heart failure: Secondary | ICD-10-CM | POA: Diagnosis present

## 2021-02-23 DIAGNOSIS — Z888 Allergy status to other drugs, medicaments and biological substances status: Secondary | ICD-10-CM

## 2021-02-23 DIAGNOSIS — Z8249 Family history of ischemic heart disease and other diseases of the circulatory system: Secondary | ICD-10-CM

## 2021-02-23 LAB — RESP PANEL BY RT-PCR (FLU A&B, COVID) ARPGX2
Influenza A by PCR: NEGATIVE
Influenza B by PCR: NEGATIVE
SARS Coronavirus 2 by RT PCR: NEGATIVE

## 2021-02-23 LAB — BASIC METABOLIC PANEL
Anion gap: 5 (ref 5–15)
BUN: 21 mg/dL (ref 8–23)
CO2: 24 mmol/L (ref 22–32)
Calcium: 8.2 mg/dL — ABNORMAL LOW (ref 8.9–10.3)
Chloride: 107 mmol/L (ref 98–111)
Creatinine, Ser: 1.03 mg/dL (ref 0.61–1.24)
GFR, Estimated: >60 mL/min (ref >60)
Glucose, Bld: 105 mg/dL — ABNORMAL HIGH (ref 70–99)
Potassium: 4.4 mmol/L (ref 3.5–5.1)
Sodium: 136 mmol/L (ref 135–145)

## 2021-02-23 LAB — CBC WITH DIFFERENTIAL/PLATELET
Abs Immature Granulocytes: 0.04 10*3/uL (ref 0.00–0.07)
Basophils Absolute: 0 10*3/uL (ref 0.0–0.1)
Basophils Relative: 0 %
Eosinophils Absolute: 0.1 10*3/uL (ref 0.0–0.5)
Eosinophils Relative: 2 %
HCT: 28.3 % — ABNORMAL LOW (ref 39.0–52.0)
Hemoglobin: 8.2 g/dL — ABNORMAL LOW (ref 13.0–17.0)
Immature Granulocytes: 1 %
Lymphocytes Relative: 16 %
Lymphs Abs: 1.4 10*3/uL (ref 0.7–4.0)
MCH: 27.4 pg (ref 26.0–34.0)
MCHC: 29 g/dL — ABNORMAL LOW (ref 30.0–36.0)
MCV: 94.6 fL (ref 80.0–100.0)
Monocytes Absolute: 0.7 10*3/uL (ref 0.1–1.0)
Monocytes Relative: 8 %
Neutro Abs: 6.3 10*3/uL (ref 1.7–7.7)
Neutrophils Relative %: 73 %
Platelets: 351 10*3/uL (ref 150–400)
RBC: 2.99 MIL/uL — ABNORMAL LOW (ref 4.22–5.81)
RDW: 19.4 % — ABNORMAL HIGH (ref 11.5–15.5)
WBC: 8.7 10*3/uL (ref 4.0–10.5)
nRBC: 0 % (ref 0.0–0.2)

## 2021-02-23 LAB — TROPONIN I (HIGH SENSITIVITY)
Troponin I (High Sensitivity): 5 ng/L (ref <18)
Troponin I (High Sensitivity): 5 ng/L (ref <18)

## 2021-02-23 LAB — BRAIN NATRIURETIC PEPTIDE: B Natriuretic Peptide: 932.6 pg/mL — ABNORMAL HIGH (ref 0.0–100.0)

## 2021-02-23 MED ORDER — PANTOPRAZOLE SODIUM 40 MG PO TBEC
40.0000 mg | DELAYED_RELEASE_TABLET | Freq: Every day | ORAL | Status: DC
  Administered 2021-02-23 – 2021-02-24 (×2): 40 mg via ORAL
  Filled 2021-02-23 (×2): qty 1

## 2021-02-23 MED ORDER — FUROSEMIDE 40 MG PO TABS
40.0000 mg | ORAL_TABLET | Freq: Every day | ORAL | Status: DC
  Administered 2021-02-23 – 2021-02-27 (×5): 40 mg via ORAL
  Filled 2021-02-23 (×5): qty 1

## 2021-02-23 MED ORDER — METHOCARBAMOL 500 MG PO TABS: 750.0000 mg | ORAL_TABLET | ORAL | Status: DC | PRN

## 2021-02-23 MED ORDER — NICOTINE 21 MG/24HR TD PT24
21.0000 mg | MEDICATED_PATCH | Freq: Every day | TRANSDERMAL | Status: DC
  Administered 2021-02-23 – 2021-02-27 (×5): 21 mg via TRANSDERMAL
  Filled 2021-02-23 (×5): qty 1

## 2021-02-23 MED ORDER — DILTIAZEM HCL-DEXTROSE 125-5 MG/125ML-% IV SOLN (PREMIX)
5.0000 mg/h | INTRAVENOUS | Status: DC
  Administered 2021-02-23: 5 mg/h via INTRAVENOUS
  Filled 2021-02-23: qty 125

## 2021-02-23 MED ORDER — ACETAMINOPHEN 325 MG PO TABS
650.0000 mg | ORAL_TABLET | ORAL | Status: DC | PRN
  Administered 2021-02-24 – 2021-02-26 (×2): 650 mg via ORAL
  Filled 2021-02-23 (×2): qty 2

## 2021-02-23 MED ORDER — CALCIUM GLUCONATE-NACL 1-0.675 GM/50ML-% IV SOLN
1.0000 g | Freq: Once | INTRAVENOUS | Status: AC
  Administered 2021-02-23: 1000 mg via INTRAVENOUS
  Filled 2021-02-23: qty 50

## 2021-02-23 MED ORDER — ATORVASTATIN CALCIUM 40 MG PO TABS
80.0000 mg | ORAL_TABLET | Freq: Every day | ORAL | Status: DC
  Administered 2021-02-23 – 2021-02-25 (×3): 80 mg via ORAL
  Filled 2021-02-23 (×2): qty 2

## 2021-02-23 MED ORDER — APIXABAN 5 MG PO TABS
5.0000 mg | ORAL_TABLET | Freq: Two times a day (BID) | ORAL | Status: DC
  Administered 2021-02-23 – 2021-02-24 (×2): 5 mg via ORAL
  Filled 2021-02-23 (×4): qty 1

## 2021-02-23 MED ORDER — METOPROLOL TARTRATE 50 MG PO TABS
75.0000 mg | ORAL_TABLET | Freq: Two times a day (BID) | ORAL | Status: DC
  Administered 2021-02-23 – 2021-02-27 (×7): 75 mg via ORAL
  Filled 2021-02-23: qty 3
  Filled 2021-02-23 (×5): qty 1
  Filled 2021-02-23: qty 3
  Filled 2021-02-23: qty 1

## 2021-02-23 MED ORDER — IPRATROPIUM-ALBUTEROL 0.5-2.5 (3) MG/3ML IN SOLN: 3.0000 mL | Freq: Four times a day (QID) | RESPIRATORY_TRACT | Status: DC | PRN

## 2021-02-23 MED ORDER — METOPROLOL TARTRATE 25 MG PO TABS: 50.0000 mg | ORAL_TABLET | Freq: Two times a day (BID) | ORAL | Status: DC

## 2021-02-23 MED ORDER — VENLAFAXINE HCL ER 75 MG PO CP24
75.0000 mg | ORAL_CAPSULE | Freq: Two times a day (BID) | ORAL | Status: DC
  Administered 2021-02-23 – 2021-02-27 (×9): 75 mg via ORAL
  Filled 2021-02-23 (×9): qty 1

## 2021-02-23 MED ORDER — MONTELUKAST SODIUM 10 MG PO TABS
10.0000 mg | ORAL_TABLET | Freq: Every day | ORAL | Status: DC
  Administered 2021-02-24 – 2021-02-26 (×4): 10 mg via ORAL
  Filled 2021-02-23 (×5): qty 1

## 2021-02-23 MED ORDER — LORAZEPAM 1 MG PO TABS
1.0000 mg | ORAL_TABLET | Freq: Once | ORAL | Status: AC
  Administered 2021-02-23: 1 mg via ORAL
  Filled 2021-02-23: qty 1

## 2021-02-23 MED ORDER — ASPIRIN 81 MG PO CHEW
81.0000 mg | CHEWABLE_TABLET | Freq: Every day | ORAL | Status: DC
  Administered 2021-02-23: 81 mg via ORAL
  Filled 2021-02-23: qty 1

## 2021-02-23 MED ORDER — MOMETASONE FURO-FORMOTEROL FUM 200-5 MCG/ACT IN AERO
2.0000 | INHALATION_SPRAY | Freq: Two times a day (BID) | RESPIRATORY_TRACT | Status: DC
  Administered 2021-02-23 – 2021-02-27 (×8): 2 via RESPIRATORY_TRACT
  Filled 2021-02-23: qty 8.8

## 2021-02-23 MED ORDER — DILTIAZEM LOAD VIA INFUSION
20.0000 mg | Freq: Once | INTRAVENOUS | Status: AC
  Administered 2021-02-23: 20 mg via INTRAVENOUS
  Filled 2021-02-23: qty 20

## 2021-02-23 MED ORDER — ONDANSETRON HCL 4 MG/2ML IJ SOLN: 4.0000 mg | Freq: Four times a day (QID) | INTRAMUSCULAR | Status: DC | PRN

## 2021-02-23 NOTE — ED Notes (Signed)
Pt was provided peri-care, cleaned, new brief applied, new linens, repositioned to comfort and condom catheter was applied with Techs and RN.

## 2021-02-23 NOTE — ED Provider Notes (Signed)
[facility] DEPT Provider Note: Georgena Spurling, MD, FACEP  CSN: [id number] MRN: [id number] ARRIVAL: 02/23/21 at [place]: [id number]   CHIEF COMPLAINT  Shortness of Breath   HISTORY OF PRESENT ILLNESS  02/23/21 1:21 AM Jordan [name] is a 70 y.o. male with a history of paroxysmal atrial fibrillation recently placed on Eliquis.  He also has a history of coronary artery disease and COPD.  He is here with shortness of breath that began about 2 hours prior to arrival.  He believes this occurred concurrently with a rapid heartbeat which he believes is another episode of atrial fibrillation.  Atrial fibrillation was confirmed by EMS.  His shortness of breath is moderate to severe, worse when lying supine.  His oxygen saturation at home was 88% and he was placed on supplemental oxygen by EMS.  He denies chest pain, abdominal pain, nausea, vomiting or diarrhea.  His legs were placed in Unna boots yesterday by a nurse.   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 Burt Knack and Dr. Cyndia Bent   Severe aortic stenosis    Severe aortic insufficiency, moderate to severe aortic stenosis   Sleep apnea    CPAP    Venous insufficiency    managed by ortho (Dr. Sharol Given)    Past Surgical History:  Procedure Laterality Date   ANKLE SURGERY Right    Dr. Marcelino Scot, has pins   COLONOSCOPY W/ POLYPECTOMY     KNEE ARTHROSCOPY Right    Dr. Noemi Chapel   MULTIPLE EXTRACTIONS WITH ALVEOLOPLASTY Bilateral 05/22/2020   Procedure:  MULTIPLE EXTRACTION WITH ALVEOLOPLASTY;  Surgeon: Diona Browner, DMD;  Location: [facility];  Service: Oral Surgery;  Laterality: Bilateral;   right foot surgery,rt great toe callus removal     RIGHT/LEFT HEART CATH AND CORONARY ANGIOGRAPHY N/A 03/06/2020   Procedure: RIGHT/LEFT HEART CATH AND CORONARY ANGIOGRAPHY;  Surgeon: Sherren Mocha, MD;  Location: [facility];  Service: Cardiovascular;  Laterality: N/A;   TEE WITHOUT CARDIOVERSION N/A 03/09/2020   Procedure: TRANSESOPHAGEAL ECHOCARDIOGRAM (TEE);  Surgeon: Dorothy Spark, MD;  Location: [facility] ENDOSCOPY;  Service: Cardiovascular;  Laterality: N/A;   TEE WITHOUT CARDIOVERSION N/A 06/30/2020   Procedure: TRANSESOPHAGEAL ECHOCARDIOGRAM (TEE);  Surgeon: Sherren Mocha, MD;  Location: [facility];  Service: Open Heart Surgery;  Laterality: N/A;   TONSILLECTOMY AND ADENOIDECTOMY     age 80   TRANSCATHETER AORTIC VALVE REPLACEMENT, TRANSFEMORAL N/A 06/30/2020   Procedure: TRANSCATHETER AORTIC VALVE REPLACEMENT, TRANSFEMORAL;  Surgeon: Sherren Mocha, MD;  Location: [facility];  Service: Open Heart Surgery;  Laterality: N/A;    Family History  Problem Relation Age of Onset   Hypertension Mother    Alzheimer's disease Mother    Stroke Father        x 3   Heart attack Father    Congestive Heart Failure Father    Diabetes Other  mat great aunts   Colon cancer Neg Hx    Rectal cancer Neg Hx    Esophageal cancer Neg Hx     Social History   Tobacco Use   Smoking status: Some Days    Packs/day: 0.75    Years: 55.00    Pack years: 41.25    Types: Cigarettes   Smokeless tobacco: Never  Vaping Use   Vaping Use: Never used  Substance Use Topics   Alcohol use: No    Alcohol/week: 0.0 standard drinks   Drug use: No    Prior to Admission medications   Medication Sig Start Date End Date Taking? Authorizing Provider  apixaban (ELIQUIS) 5 MG TABS tablet Take 1 tablet (5 mg total) by mouth 2 (two) times daily. 01/05/21 02/04/21  Barb Merino, MD  aspirin 81 MG tablet Take 81 mg by mouth daily.    [provider]  atorvastatin (LIPITOR) 80 MG tablet Take 80 mg by mouth daily after supper.     [provider]  budesonide-formoterol (SYMBICORT) 160-4.5 MCG/ACT inhaler Inhale 2 puffs into the lungs in the morning and at bedtime. 03/12/20   Aline August, MD  butalbital-acetaminophen-caffeine (FIORICET, ESGIC) 50-325-40 MG per tablet Take 1 tablet by mouth 2 (two) times daily as needed for migraine.     [provider]  folic acid (FOLVITE) 1 MG tablet Take 1 tablet (1 mg total) by mouth daily. 11/04/14   [name], MD  furosemide (LASIX) 40 MG tablet Take 1 tablet (40 mg total) by mouth daily. 03/12/20   Aline August, MD  ipratropium-albuterol (DUONEB) 0.5-2.5 (3) MG/3ML SOLN Take 3 mLs by nebulization every 6 (six) hours as needed (shortness of breath/wheezing). 03/12/20   Aline August, MD  methocarbamol (ROBAXIN) 750 MG tablet Take 750 mg by mouth as needed for muscle spasms. 06/03/20   [provider]  metoprolol tartrate (LOPRESSOR) 50 MG tablet Take 1 tablet (50 mg total) by mouth 2 (two) times daily. 01/05/21 02/04/21  Barb Merino, MD  montelukast (SINGULAIR) 10 MG tablet Take 10 mg by mouth daily. 02/07/20   [provider]  Multiple Vitamin (MULTIVITAMIN WITH MINERALS) TABS Take 1 tablet by mouth daily.    [provider]  nystatin cream (MYCOSTATIN) Apply 1 application topically 2 (two) times daily. 06/24/20   Eileen Stanford, PA-C  pantoprazole (PROTONIX) 40 MG tablet Take 1 tablet (40 mg total) by mouth daily. 07/03/20   Eileen Stanford, PA-C  promethazine (PHENERGAN) 25 MG tablet Take 25 mg by mouth every 8 (eight) hours as needed for nausea/vomiting. 05/14/20   [provider]  venlafaxine XR (EFFEXOR-XR) 75 MG 24 hr capsule Take 75 mg by mouth in the morning and at bedtime. MUST HAVE BRAND NAME ONLY    [provider]    Allergies Altace  [ramipril], Codeine, and Naproxen   REVIEW OF SYSTEMS  Negative except as noted here or in the History of Present Illness.   PHYSICAL EXAMINATION  Initial Vital Signs Blood pressure 126/78, pulse (!) 126, temperature 98 F (36.7 C), resp. rate (!) 26, height 6' (1.829 m), weight 102.1 kg, SpO2 100 %.  Examination General: Well-developed, thin male in no acute distress; appears older than age of record HENT: normocephalic; atraumatic; edentulous Eyes: pupils equal, round and reactive to light; extraocular muscles intact Neck: supple Heart: Irregular rhythm; tachycardia Lungs: Distant sounds Abdomen: soft; nondistended; nontender; bowel sounds present Extremities: No deformity; lower legs in Unna boots Neurologic: Awake, alert and  oriented; motor function intact in all extremities and symmetric; no facial droop Skin: Warm and dry Psychiatric: Normal mood and affect   RESULTS  Summary of this visit's results, reviewed and interpreted by myself:   EKG Interpretation  Date/Time:  Tuesday February 23 2021 01:57:13 EDT Ventricular Rate:  111 PR Interval:    QRS Duration: 88 QT Interval:  321 QTC Calculation: 437 R Axis:   81 Text Interpretation: Atrial fibrillation with RVR Borderline right axis deviation Borderline repolarization abnormality No significant change was found Confirmed by Shanon Rosser [phone]) on 02/23/2021 2:01:05 AM       Laboratory Studies: Results for orders placed or performed during the hospital encounter of 02/23/21 (from the past 24 hour(s))  CBC with Differential/Platelet     Status: Abnormal   Collection Time: 02/23/21  1:27 AM  Result Value Ref Range   WBC 8.7 4.0 - 10.5 K/uL   RBC 2.99 (L) 4.22 - 5.81 MIL/uL   Hemoglobin 8.2 (L) 13.0 - 17.0 g/dL   HCT 28.3 (L) 39.0 - 52.0 %   MCV 94.6 80.0 - 100.0 fL   MCH 27.4 26.0 - 34.0 pg   MCHC 29.0 (L) 30.0 - 36.0 g/dL   RDW 19.4 (H) 11.5 - 15.5 %   Platelets 351 150 - 400 K/uL   nRBC 0.0 0.0 - 0.2 %    Neutrophils Relative % 73 %   Neutro Abs 6.3 1.7 - 7.7 K/uL   Lymphocytes Relative 16 %   Lymphs Abs 1.4 0.7 - 4.0 K/uL   Monocytes Relative 8 %   Monocytes Absolute 0.7 0.1 - 1.0 K/uL   Eosinophils Relative 2 %   Eosinophils Absolute 0.1 0.0 - 0.5 K/uL   Basophils Relative 0 %   Basophils Absolute 0.0 0.0 - 0.1 K/uL   Immature Granulocytes 1 %   Abs Immature Granulocytes 0.04 0.00 - 0.07 K/uL  Basic metabolic panel     Status: Abnormal   Collection Time: 02/23/21  1:27 AM  Result Value Ref Range   Sodium 136 135 - 145 mmol/L   Potassium 4.4 3.5 - 5.1 mmol/L   Chloride 107 98 - 111 mmol/L   CO2 24 22 - 32 mmol/L   Glucose, Bld 105 (H) 70 - 99 mg/dL   BUN 21 8 - 23 mg/dL   Creatinine, Ser 1.03 0.61 - 1.24 mg/dL   Calcium 8.2 (L) 8.9 - 10.3 mg/dL   GFR, Estimated >60 >60 mL/min   Anion gap 5 5 - 15  Troponin I (High Sensitivity)     Status: None   Collection Time: 02/23/21  1:27 AM  Result Value Ref Range   Troponin I (High Sensitivity) 5 <18 ng/L  Brain natriuretic peptide     Status: Abnormal   Collection Time: 02/23/21  1:27 AM  Result Value Ref Range   B Natriuretic Peptide 932.6 (H) 0.0 - 100.0 pg/mL  Troponin I (High Sensitivity)     Status: None   Collection Time: 02/23/21  3:58 AM  Result Value Ref Range   Troponin I (High Sensitivity) 5 <18 ng/L   Imaging Studies: DG Chest 2 View  Result Date: 02/23/2021 CLINICAL DATA:  Dyspnea, atrial fibrillation, COPD. EXAM: CHEST - 2 VIEW COMPARISON:  01/02/2021 FINDINGS: Lungs are clear. No pneumothorax. Small right pleural effusion. Resolution of previously noted pulmonary edema. Transcatheter aortic valve replacement has been performed in the interval. Cardiac size is within normal limits. Osseous structures are age-appropriate. IMPRESSION: Interval transcatheter aortic valve replacement.  Resolution of previously noted pulmonary edema. Small right pleural effusion. Electronically Signed   By: Fidela Salisbury M.D.   On: 02/23/2021  01:51    ED COURSE and MDM  Nursing notes, initial and subsequent vitals signs, including pulse oximetry, reviewed and interpreted by myself.  Vitals:   02/23/21 0245 02/23/21 0315 02/23/21 0345 02/23/21 0415  BP: (!) 96/58 97/62 (!) 104/56 (!) 94/56  Pulse: 95 84 94 95  Resp: (!) '23 19 15 18  '$ Temp:      TempSrc:      SpO2: 100% 100% 97% 100%  Weight:      Height:       Medications  diltiazem (CARDIZEM) 1 mg/mL load via infusion 20 mg (20 mg Intravenous Bolus from Bag 02/23/21 0159)    And  diltiazem (CARDIZEM) 125 mg in dextrose 5% 125 mL (1 mg/mL) infusion (0 mg/hr Intravenous Paused 02/23/21 0330)  LORazepam (ATIVAN) tablet 1 mg (1 mg Oral Given 02/23/21 0158)  calcium gluconate 1 g/ 50 mL sodium chloride IVPB (0 g Intravenous Stopped 02/23/21 0354)   1:25 AM Cardizem bolus and drip started for A. fib with RVR.  2:36 AM Rate controlled on Cardizem drip at 5 mg/h.  Calcium gluconate ordered for hypocalcemia and borderline hypotension after receiving Cardizem bolus.  4:45 AM Patient trialed off Cardizem for 1 hour.  Rate now in the 100s but patient still feeling better than he did on arrival.  He does not feel good enough to go home though.  We will have him admitted.  The patient's anemia appears chronic.  Elevated BNP is likely due to exacerbation of CHF due to the strain of the rapid ventricular rate.  Dr. Myna Hidalgo to admit.   PROCEDURES  Procedures CRITICAL CARE Performed by: Karen Chafe Ledia [place] Total critical care time: 30 minutes Critical care time was exclusive of separately billable procedures and treating other patients. Critical care was necessary to treat or prevent imminent or life-threatening deterioration. Critical care was time spent personally by me on the following activities: development of treatment plan with patient and/or surrogate as well as nursing, discussions with consultants, evaluation of patient's response to treatment, examination of patient, obtaining history  from patient or surrogate, ordering and performing treatments and interventions, ordering and review of laboratory studies, ordering and review of radiographic studies, pulse oximetry and re-evaluation of patient's condition.   ED DIAGNOSES     ICD-10-CM   1. Atrial fibrillation with rapid ventricular response (HCC)  I48.91     2. Shortness of breath  R06.02     3. Chronic anemia  D64.9     4. Hypocalcemia  E83.51     5. Elevated brain natriuretic peptide (BNP) level  R79.89          Zyad Boomer, Jenny Reichmann, MD 02/23/21 [phone]

## 2021-02-23 NOTE — ED Triage Notes (Signed)
Pt BIB from EMS from home. Per report pt was SOB for the past 2 hours. Pt denies Chest pain. Pt hx of afib, COPD CHF. Pt a/ox4.  CBG 72 BP 124/77 HR 72 RR 26 O2 sat 98% on 3L

## 2021-02-23 NOTE — H&P (Signed)
History and Physical    BEJAMIN GREENSLADE [id number] DOB: [birth year] DOA: 02/23/2021  PCP: [name], MD   Patient coming from: home   Chief Complaint: SOB, palpitations   HPI: Jordan [name] is a 70 y.o. male with medical history significant for COPD, chronic hypoxic respiratory failure, coronary artery disease, AS s/p TAVR, chronic venous stasis, and atrial fibrillation on Eliquis, now presenting to the emergency department with acute onset of shortness of breath and palpitations.  Patient was admitted to the hospital in July 2022 with cellulitis, developed new onset atrial fibrillation during that admission with rates as high as 130s, was started on Eliquis, and atenolol was changed to metoprolol at that time.  He had been doing fairly well back at home before developing shortness of breath and rapid palpitations last night.  He denies any chest pain, cough, fevers, or chills.  ED Course: Upon arrival to the ED, patient is found to be afebrile, saturating mid to upper 90s on 3 L/min of supplemental oxygen, tachycardic to 130, and with blood pressure 92/68.  EKG features atrial fibrillation with RVR.  Chest x-ray with resolution of previously noted pulmonary edema, small right pleural effusion, and interval TAVR.  Chemistry panel is unremarkable and CBC notable for a stable chronic normocytic anemia.  Troponin was normal x2 and BNP elevated at 933.  Patient was treated with Ativan, 20 mg IV diltiazem injection, and started on diltiazem infusion in the ED.  Rate control was achieved but began sustaining RVR again once the diltiazem infusion was stopped.  Diltiazem infusion was resumed and hospitalist asked to admit.  Review of Systems:  All other systems reviewed and apart from HPI, are negative.  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 Burt Knack and Dr. Cyndia Bent   Severe aortic stenosis    Severe aortic insufficiency, moderate to severe aortic stenosis   Sleep apnea    CPAP    Venous insufficiency    managed by ortho (Dr. Sharol Given)    Past Surgical History:  Procedure Laterality Date   ANKLE SURGERY Right    Dr. Marcelino Scot, has pins   COLONOSCOPY W/ POLYPECTOMY     KNEE ARTHROSCOPY Right    Dr. Noemi Chapel   MULTIPLE EXTRACTIONS WITH ALVEOLOPLASTY Bilateral 05/22/2020   Procedure: MULTIPLE EXTRACTION WITH ALVEOLOPLASTY;  Surgeon: Diona Browner, DMD;  Location: [facility] OR;  Service: Oral Surgery;  Laterality: Bilateral;   right foot surgery,rt great toe callus removal     RIGHT/LEFT HEART CATH AND CORONARY ANGIOGRAPHY N/A 03/06/2020   Procedure: RIGHT/LEFT HEART CATH AND CORONARY ANGIOGRAPHY;  Surgeon: Sherren Mocha, MD;  Location: [facility];  Service: Cardiovascular;  Laterality: N/A;   TEE WITHOUT CARDIOVERSION N/A 03/09/2020   Procedure: TRANSESOPHAGEAL ECHOCARDIOGRAM (TEE);  Surgeon: Dorothy Spark, MD;  Location: [facility];  Service: Cardiovascular;  Laterality: N/A;   TEE WITHOUT CARDIOVERSION N/A 06/30/2020  Procedure: TRANSESOPHAGEAL ECHOCARDIOGRAM (TEE);  Surgeon: Sherren Mocha, MD;  Location: [facility];  Service: Open Heart Surgery;  Laterality: N/A;   TONSILLECTOMY AND ADENOIDECTOMY     age 46   TRANSCATHETER AORTIC VALVE REPLACEMENT, TRANSFEMORAL N/A 06/30/2020   Procedure: TRANSCATHETER AORTIC VALVE REPLACEMENT, TRANSFEMORAL;  Surgeon: Sherren Mocha, MD;  Location: [place];  Service: Open Heart Surgery;  Laterality: N/A;    Social History:   reports that he has been  smoking cigarettes. He has a 41.25 pack-year smoking history. He has never used smokeless tobacco. He reports that he does not drink alcohol and does not use drugs.  Allergies  Allergen Reactions   Altace [Ramipril] Other (See Comments)    Dizziness, migraine, weakness- "there is dye in it"   Codeine Itching   Naproxen Itching    Family History  Problem Relation Age of Onset   Hypertension Mother    Alzheimer's disease Mother    Stroke Father        x 3   Heart attack Father    Congestive Heart Failure Father    Diabetes Other        mat great aunts   Colon cancer Neg Hx    Rectal cancer Neg Hx    Esophageal cancer Neg Hx      Prior to Admission medications   Medication Sig Start Date End Date Taking? Authorizing Provider  apixaban (ELIQUIS) 5 MG TABS tablet Take 1 tablet (5 mg total) by mouth 2 (two) times daily. 01/05/21 02/04/21  Barb Merino, MD  aspirin 81 MG tablet Take 81 mg by mouth daily.    [provider]  atorvastatin (LIPITOR) 80 MG tablet Take 80 mg by mouth daily after supper.     [provider]  budesonide-formoterol (SYMBICORT) 160-4.5 MCG/ACT inhaler Inhale 2 puffs into the lungs in the morning and at bedtime. 03/12/20   Aline August, MD  butalbital-acetaminophen-caffeine (FIORICET, ESGIC) 50-325-40 MG per tablet Take 1 tablet by mouth 2 (two) times daily as needed for migraine.     [provider]  folic acid (FOLVITE) 1 MG tablet Take 1 tablet (1 mg total) by mouth daily. 11/04/14   [name], MD  furosemide (LASIX) 40 MG tablet Take 1 tablet (40 mg total) by mouth daily. 03/12/20   Aline August, MD  ipratropium-albuterol (DUONEB) 0.5-2.5 (3) MG/3ML SOLN Take 3 mLs by nebulization every 6 (six) hours as needed (shortness of breath/wheezing). 03/12/20   Aline August, MD  methocarbamol (ROBAXIN) 750 MG tablet Take 750 mg by mouth as needed for muscle spasms. 06/03/20   [provider]  metoprolol tartrate (LOPRESSOR) 50  MG tablet Take 1 tablet (50 mg total) by mouth 2 (two) times daily. 01/05/21 02/04/21  Barb Merino, MD  montelukast (SINGULAIR) 10 MG tablet Take 10 mg by mouth daily. 02/07/20   [provider]  Multiple Vitamin (MULTIVITAMIN WITH MINERALS) TABS Take 1 tablet by mouth daily.    [provider]  nystatin cream (MYCOSTATIN) Apply 1 application topically 2 (two) times daily. 06/24/20   Eileen Stanford, PA-C  pantoprazole (PROTONIX) 40 MG tablet Take 1 tablet (40 mg total) by mouth daily. 07/03/20   Eileen Stanford, PA-C  promethazine (PHENERGAN) 25 MG tablet Take 25 mg by mouth every 8 (eight) hours as needed for nausea/vomiting. 05/14/20   [provider]  venlafaxine XR (EFFEXOR-XR) 75 MG 24 hr capsule Take 75 mg by mouth in the morning and at bedtime. MUST HAVE BRAND NAME  ONLY    [provider]    Physical Exam: Vitals:   02/23/21 0345 02/23/21 0415 02/23/21 0445 02/23/21 0500  BP: (!) 104/56 (!) 94/56 110/67 102/61  Pulse: 94 95 97 (!) 101  Resp: '15 18 18 16  '$ Temp:      TempSrc:      SpO2: 97% 100% 100% 100%  Weight:      Height:        Constitutional: NAD, calm  Eyes: PERTLA, lids and conjunctivae normal ENMT: Mucous membranes are moist. Posterior pharynx clear of any exudate or lesions.   Neck: supple, no masses  Respiratory: Diminished breath sounds bilaterally, prolonged expiratory phase. No accessory muscle use.  Cardiovascular: Rate ~100 and irregularly irregluar. No significant JVD. Abdomen: No distension, no tenderness, soft. Bowel sounds active.  Musculoskeletal: no clubbing / cyanosis. No joint deformity upper and lower extremities.   Skin: Compression wraps to b/l lower legs. Warm, dry, well-perfused. Neurologic: CN 2-12 grossly intact. Sensation intact. Moving all extremities.  Psychiatric: Alert and oriented to person, place, and situation. Very pleasant and cooperative.    Labs and Imaging on Admission: I have personally  reviewed following labs and imaging studies  CBC: Recent Labs  Lab 02/23/21 0127  WBC 8.7  NEUTROABS 6.3  HGB 8.2*  HCT 28.3*  MCV 94.6  PLT [id number]   Basic Metabolic Panel: Recent Labs  Lab 02/23/21 0127  NA 136  K 4.4  CL 107  CO2 24  GLUCOSE 105*  BUN 21  CREATININE 1.03  CALCIUM 8.2*   GFR: Estimated Creatinine Clearance: 82.5 mL/min (by C-G formula based on SCr of 1.03 mg/dL). Liver Function Tests: No results for input(s): AST, ALT, ALKPHOS, BILITOT, PROT, ALBUMIN in the last 168 hours. No results for input(s): LIPASE, AMYLASE in the last 168 hours. No results for input(s): AMMONIA in the last 168 hours. Coagulation Profile: No results for input(s): INR, PROTIME in the last 168 hours. Cardiac Enzymes: No results for input(s): CKTOTAL, CKMB, CKMBINDEX, TROPONINI in the last 168 hours. BNP (last 3 results) No results for input(s): PROBNP in the last 8760 hours. HbA1C: No results for input(s): HGBA1C in the last 72 hours. CBG: No results for input(s): GLUCAP in the last 168 hours. Lipid Profile: No results for input(s): CHOL, HDL, LDLCALC, TRIG, CHOLHDL, LDLDIRECT in the last 72 hours. Thyroid Function Tests: No results for input(s): TSH, T4TOTAL, FREET4, T3FREE, THYROIDAB in the last 72 hours. Anemia Panel: No results for input(s): VITAMINB12, FOLATE, FERRITIN, TIBC, IRON, RETICCTPCT in the last 72 hours. Urine analysis:    Component Value Date/Time   COLORURINE YELLOW 12/29/2020 2022   APPEARANCEUR CLEAR 12/29/2020 2022   LABSPEC 1.006 12/29/2020 2022   PHURINE 5.0 12/29/2020 2022   GLUCOSEU NEGATIVE 12/29/2020 2022   HGBUR NEGATIVE 12/29/2020 2022   BILIRUBINUR NEGATIVE 12/29/2020 2022   KETONESUR NEGATIVE 12/29/2020 2022   PROTEINUR NEGATIVE 12/29/2020 2022   NITRITE NEGATIVE 12/29/2020 2022   LEUKOCYTESUR NEGATIVE 12/29/2020 2022   Sepsis Labs: '@LABRCNTIP'$ (procalcitonin:4,lacticidven:4) )No results found for this or any previous visit (from the past  240 hour(s)).   Radiological Exams on Admission: DG Chest 2 View  Result Date: 02/23/2021 CLINICAL DATA:  Dyspnea, atrial fibrillation, COPD. EXAM: CHEST - 2 VIEW COMPARISON:  01/02/2021 FINDINGS: Lungs are clear. No pneumothorax. Small right pleural effusion. Resolution of previously noted pulmonary edema. Transcatheter aortic valve replacement has been performed in the interval. Cardiac size is within normal limits. Osseous structures are age-appropriate. IMPRESSION: Interval transcatheter aortic  valve replacement. Resolution of previously noted pulmonary edema. Small right pleural effusion. Electronically Signed   By: Fidela Salisbury M.D.   On: 02/23/2021 01:51    EKG: Independently reviewed. Atrial fibrillation, rate 111.   Assessment/Plan   1. Atrial fibrillation with RVR  - Presents with acute onset palpitations and SOB and found to be in atrial fibrillation with rate 130  - He was given 20 mg diltiazem injection and started on diltiazem infusion in ED, rate was controlled, but then sustaining >110 again when infusion stopped  - Continue diltiazem infusion for now, continue metoprolol and consider dose increase, continue Eliquis   2. Chronic diastolic CHF  - Appears compensated  - EF was 60-65% on TTE in February 2022  - Continue Lasix and beta-blocker    3. COPD; chronic hypoxic respiratory failure  - Stable on admission  - Continue ICS/LABA, as needed duo nebs, and 2 Lpm supplemental O2  - Continue smoking cessation efforts   4. CAD - No anginal complaints  - Continue ASA, statin, beta-blocker    5. Anxiety  - Continue Effexor     DVT prophylaxis: Eliquis  Code Status: Full  Level of Care: Level of care: Stepdown Family Communication: none present  Disposition Plan:  Patient is from: Home  Anticipated d/c is to: TBD Anticipated d/c date is: 9/7 or 02/25/21 Patient currently: Pending rate-control off of IV medications  Consults called: none  Admission status:  Observation    Vianne Bulls, MD [facility]  02/23/2021, 5:24 AM

## 2021-02-23 NOTE — Progress Notes (Signed)
Progress Note    Jordan [name]   [id number]  DOB: 1950-11-07  DOA: 02/23/2021     0  PCP: [name], MD  Initial CC: Shortness of breath, palpitations  Hospital Course: Jordan [name] is a 70 y.o. male with medical history significant for COPD, chronic hypoxic respiratory failure, coronary artery disease, AS s/p TAVR, chronic venous stasis, and atrial fibrillation on Eliquis, who presented to the ER with acute onset of shortness of breath and palpitations.  Patient was admitted to the hospital in July 2022 with cellulitis, developed new onset atrial fibrillation during that admission with rates as high as 130s, was started on Eliquis, and atenolol was changed to metoprolol at that time.  He had been doing fairly well back at home before developing shortness of breath and rapid palpitations prior to admission.  He denies any chest pain, cough, fevers, or chills.   Upon arrival to the ED, patient is found to be afebrile, saturating mid to upper 90s on 3 L/min of supplemental oxygen, tachycardic to 130, and with blood pressure 92/68.  EKG features atrial fibrillation with RVR.   Chest x-ray with resolution of previously noted pulmonary edema, small right pleural effusion, and interval TAVR.  Chemistry panel is unremarkable and CBC notable for a stable chronic normocytic anemia.  Troponin was normal x2 and BNP elevated at 933.   Patient was treated with Ativan, 20 mg IV diltiazem injection, and started on diltiazem infusion in the ED.  Rate control was achieved but began sustaining RVR again once the diltiazem infusion was stopped.  Diltiazem infusion was resumed.  He was admitted for further work-up and treatment. His metoprolol was further uptitrated from his home dose and Cardizem drip was able to be weaned off.  Interval History:  Seen in the ER this morning.  Had improvement of his symptoms since admission.  Difficulty getting off Cardizem drip initially this morning but after  up titration of Lopressor, able to liberate for now.  ROS: Constitutional: negative for chills and fevers, Respiratory: negative for cough and sputum, Cardiovascular: positive for irregular heart beat, negative for chest pain, and Gastrointestinal: negative for abdominal pain  Assessment & Plan:  Atrial fibrillation with RVR  - Presents with acute onset palpitations and SOB and found to be in atrial fibrillation with rate 130  -Initially started on Cardizem drip in the ER.  Difficulty tapering off initially -Home Lopressor dose increased to 75 mg twice daily; monitor for tolerance - Cardizem drip off for now.  Changing bed status to progressive from SDU - continue Eliquis    Chronic diastolic CHF  - Appears compensated  - EF was 60-65% on TTE in February 2022  - Continue Lasix and beta-blocker     COPD Chronic hypoxic respiratory failure  - Stable on admission  - Continue ICS/LABA, as needed duo nebs, and 2 Lpm supplemental O2  - Continue smoking cessation efforts    CAD - No anginal complaints  - Continue ASA, statin, beta-blocker     Anxiety  - Continue Effexor (BID dosing confirmed by pharmacy)  Old records reviewed in assessment of this patient  Antimicrobials:   DVT prophylaxis:  apixaban (ELIQUIS) tablet 5 mg   Code Status:   Code Status: Full Code Family Communication:   Disposition Plan: Status is: Inpatient  Remains inpatient appropriate because:IV treatments appropriate due to intensity of illness or inability to take PO and Inpatient level of care appropriate due to severity of illness  Dispo: The  patient is from: Home              Anticipated d/c is to: Home              Patient currently is not medically stable to d/c.   Difficult to place patient No Risk of unplanned readmission score: Unplanned Admission- Pilot do not use: 21.99   Objective: Blood pressure (!) 92/58, pulse 77, temperature 98.6 F (37 C), temperature source Rectal, resp. rate 18,  height 6' (1.829 m), weight 102.1 kg, SpO2 100 %.  Examination: General appearance: alert, cooperative, and no distress Head: Normocephalic, without obvious abnormality, atraumatic Eyes:  EOMI Lungs: clear to auscultation bilaterally Heart: irregularly irregular rhythm and S1, S2 normal Abdomen: normal findings: bowel sounds normal and soft, non-tender Extremities:  no edema Skin: mobility and turgor normal Neurologic: Grossly normal  Consultants:    Procedures:    Data Reviewed: I have personally reviewed following labs and imaging studies Results for orders placed or performed during the hospital encounter of 02/23/21 (from the past 24 hour(s))  CBC with Differential/Platelet     Status: Abnormal   Collection Time: 02/23/21  1:27 AM  Result Value Ref Range   WBC 8.7 4.0 - 10.5 K/uL   RBC 2.99 (L) 4.22 - 5.81 MIL/uL   Hemoglobin 8.2 (L) 13.0 - 17.0 g/dL   HCT 28.3 (L) 39.0 - 52.0 %   MCV 94.6 80.0 - 100.0 fL   MCH 27.4 26.0 - 34.0 pg   MCHC 29.0 (L) 30.0 - 36.0 g/dL   RDW 19.4 (H) 11.5 - 15.5 %   Platelets 351 150 - 400 K/uL   nRBC 0.0 0.0 - 0.2 %   Neutrophils Relative % 73 %   Neutro Abs 6.3 1.7 - 7.7 K/uL   Lymphocytes Relative 16 %   Lymphs Abs 1.4 0.7 - 4.0 K/uL   Monocytes Relative 8 %   Monocytes Absolute 0.7 0.1 - 1.0 K/uL   Eosinophils Relative 2 %   Eosinophils Absolute 0.1 0.0 - 0.5 K/uL   Basophils Relative 0 %   Basophils Absolute 0.0 0.0 - 0.1 K/uL   Immature Granulocytes 1 %   Abs Immature Granulocytes 0.04 0.00 - 0.07 K/uL  Basic metabolic panel     Status: Abnormal   Collection Time: 02/23/21  1:27 AM  Result Value Ref Range   Sodium 136 135 - 145 mmol/L   Potassium 4.4 3.5 - 5.1 mmol/L   Chloride 107 98 - 111 mmol/L   CO2 24 22 - 32 mmol/L   Glucose, Bld 105 (H) 70 - 99 mg/dL   BUN 21 8 - 23 mg/dL   Creatinine, Ser 1.03 0.61 - 1.24 mg/dL   Calcium 8.2 (L) 8.9 - 10.3 mg/dL   GFR, Estimated >60 >60 mL/min   Anion gap 5 5 - 15  Troponin I  (High Sensitivity)     Status: None   Collection Time: 02/23/21  1:27 AM  Result Value Ref Range   Troponin I (High Sensitivity) 5 <18 ng/L  Brain natriuretic peptide     Status: Abnormal   Collection Time: 02/23/21  1:27 AM  Result Value Ref Range   B Natriuretic Peptide 932.6 (H) 0.0 - 100.0 pg/mL  Troponin I (High Sensitivity)     Status: None   Collection Time: 02/23/21  3:58 AM  Result Value Ref Range   Troponin I (High Sensitivity) 5 <18 ng/L  Resp Panel by RT-PCR (Flu A&B, Covid) Nasopharyngeal Swab  Status: None   Collection Time: 02/23/21  4:47 AM   Specimen: Nasopharyngeal Swab; Nasopharyngeal(NP) swabs in vial transport medium  Result Value Ref Range   SARS Coronavirus 2 by RT PCR NEGATIVE NEGATIVE   Influenza A by PCR NEGATIVE NEGATIVE   Influenza B by PCR NEGATIVE NEGATIVE    Recent Results (from the past 240 hour(s))  Resp Panel by RT-PCR (Flu A&B, Covid) Nasopharyngeal Swab     Status: None   Collection Time: 02/23/21  4:47 AM   Specimen: Nasopharyngeal Swab; Nasopharyngeal(NP) swabs in vial transport medium  Result Value Ref Range Status   SARS Coronavirus 2 by RT PCR NEGATIVE NEGATIVE Final    Comment: (NOTE) SARS-CoV-2 target nucleic acids are NOT DETECTED.  The SARS-CoV-2 RNA is generally detectable in upper respiratory specimens during the acute phase of infection. The lowest concentration of SARS-CoV-2 viral copies this assay can detect is 138 copies/mL. A negative result does not preclude SARS-Cov-2 infection and should not be used as the sole basis for treatment or other patient management decisions. A negative result may occur with  improper specimen collection/handling, submission of specimen other than nasopharyngeal swab, presence of viral mutation(s) within the areas targeted by this assay, and inadequate number of viral copies(<138 copies/mL). A negative result must be combined with clinical observations, patient history, and  epidemiological information. The expected result is Negative.  Fact Sheet for Patients:  EntrepreneurPulse.com.au  Fact Sheet for Healthcare Providers:  IncredibleEmployment.be  This test is no t yet approved or cleared by the [place] FDA and  has been authorized for detection and/or diagnosis of SARS-CoV-2 by FDA under an Emergency Use Authorization (EUA). This EUA will remain  in effect (meaning this test can be used) for the duration of the COVID-19 declaration under Section 564(b)(1) of the Act, 21 U.S.C.section 360bbb-3(b)(1), unless the authorization is terminated  or revoked sooner.       Influenza A by PCR NEGATIVE NEGATIVE Final   Influenza B by PCR NEGATIVE NEGATIVE Final    Comment: (NOTE) The Xpert Xpress SARS-CoV-2/FLU/RSV plus assay is intended as an aid in the diagnosis of influenza from Nasopharyngeal swab specimens and should not be used as a sole basis for treatment. Nasal washings and aspirates are unacceptable for Xpert Xpress SARS-CoV-2/FLU/RSV testing.  Fact Sheet for Patients: EntrepreneurPulse.com.au  Fact Sheet for Healthcare Providers: IncredibleEmployment.be  This test is not yet approved or cleared by the [place] FDA and has been authorized for detection and/or diagnosis of SARS-CoV-2 by FDA under an Emergency Use Authorization (EUA). This EUA will remain in effect (meaning this test can be used) for the duration of the COVID-19 declaration under Section 564(b)(1) of the Act, 21 U.S.C. section 360bbb-3(b)(1), unless the authorization is terminated or revoked.  Performed at [facility], [place] [address], [place], [place] [address]      Radiology Studies: DG Chest 2 View  Result Date: 02/23/2021 CLINICAL DATA:  Dyspnea, atrial fibrillation, COPD. EXAM: CHEST - 2 VIEW COMPARISON:  01/02/2021 FINDINGS: Lungs are clear. No pneumothorax. Small  right pleural effusion. Resolution of previously noted pulmonary edema. Transcatheter aortic valve replacement has been performed in the interval. Cardiac size is within normal limits. Osseous structures are age-appropriate. IMPRESSION: Interval transcatheter aortic valve replacement. Resolution of previously noted pulmonary edema. Small right pleural effusion. Electronically Signed   By: Fidela Salisbury M.D.   On: 02/23/2021 01:51   DG Chest 2 View  Final Result      Scheduled Meds:  apixaban  5 mg Oral BID   aspirin  81 mg Oral Daily   atorvastatin  80 mg Oral QPC supper   furosemide  40 mg Oral Daily   metoprolol tartrate  75 mg Oral BID   mometasone-formoterol  2 puff Inhalation BID   montelukast  10 mg Oral QHS   nicotine  21 mg Transdermal Daily   pantoprazole  40 mg Oral Daily   venlafaxine XR  75 mg Oral BID   PRN Meds: acetaminophen, ipratropium-albuterol, methocarbamol, ondansetron (ZOFRAN) IV Continuous Infusions:  diltiazem (CARDIZEM) infusion Stopped (02/23/21 0921)     LOS: 0 days  Time spent: Greater than 50% of the 35 minute visit was spent in counseling/coordination of care for the patient as laid out in the A&P.   Dwyane Dee, MD [facility] 02/23/2021, 12:52 PM

## 2021-02-23 NOTE — ED Notes (Signed)
Pt consumed 50% of meal.

## 2021-02-23 NOTE — Progress Notes (Signed)
Charge nurse accepting patient and ED RN please message Jacqulyn Ducking RN for any changes or updates.

## 2021-02-23 NOTE — ED Notes (Signed)
Pt was pulled and repositioned to comfort in bed. Pt currently denies any complaints.

## 2021-02-23 NOTE — ED Notes (Signed)
Pt placed on bedpan for BM.

## 2021-02-23 NOTE — ED Notes (Signed)
Pt was checked to have no soiled brief with condom cath still in-tact, pt was repositioned in bed to comfort, pt currently denies any complaints.

## 2021-02-23 NOTE — ED Notes (Signed)
Pt provided meal tray

## 2021-02-23 NOTE — Hospital Course (Signed)
Jordan [name] is a 70 y.o. male with medical history significant for COPD, chronic hypoxic respiratory failure, coronary artery disease, AS s/p TAVR, chronic venous stasis, and atrial fibrillation on Eliquis, who presented to the ER with acute onset of shortness of breath and palpitations.  Patient was admitted to the hospital in July 2022 with cellulitis, developed new onset atrial fibrillation during that admission with rates as high as 130s, was started on Eliquis, and atenolol was changed to metoprolol at that time.  He had been doing fairly well back at home before developing shortness of breath and rapid palpitations prior to admission.  He denies any chest pain, cough, fevers, or chills.   Upon arrival to the ED, patient is found to be afebrile, saturating mid to upper 90s on 3 L/min of supplemental oxygen, tachycardic to 130, and with blood pressure 92/68.  EKG features atrial fibrillation with RVR.   Chest x-ray with resolution of previously noted pulmonary edema, small right pleural effusion, and interval TAVR.  Chemistry panel is unremarkable and CBC notable for a stable chronic normocytic anemia.  Troponin was normal x2 and BNP elevated at 933.   Patient was treated with Ativan, 20 mg IV diltiazem injection, and started on diltiazem infusion in the ED.  Rate control was achieved but began sustaining RVR again once the diltiazem infusion was stopped.  Diltiazem infusion was resumed.  He was admitted for further work-up and treatment. His metoprolol was further uptitrated from his home dose and Cardizem drip was able to be weaned off.

## 2021-02-24 LAB — BASIC METABOLIC PANEL
Anion gap: 4 — ABNORMAL LOW (ref 5–15)
BUN: 19 mg/dL (ref 8–23)
CO2: 27 mmol/L (ref 22–32)
Calcium: 8.1 mg/dL — ABNORMAL LOW (ref 8.9–10.3)
Chloride: 107 mmol/L (ref 98–111)
Creatinine, Ser: 1.05 mg/dL (ref 0.61–1.24)
GFR, Estimated: >60 mL/min (ref >60)
Glucose, Bld: 103 mg/dL — ABNORMAL HIGH (ref 70–99)
Potassium: 4.3 mmol/L (ref 3.5–5.1)
Sodium: 138 mmol/L (ref 135–145)

## 2021-02-24 LAB — CBC
HCT: 22.9 % — ABNORMAL LOW (ref 39.0–52.0)
Hemoglobin: 6.5 g/dL — CL (ref 13.0–17.0)
MCH: 26.7 pg (ref 26.0–34.0)
MCHC: 28.4 g/dL — ABNORMAL LOW (ref 30.0–36.0)
MCV: 94.2 fL (ref 80.0–100.0)
Platelets: 234 10*3/uL (ref 150–400)
RBC: 2.43 MIL/uL — ABNORMAL LOW (ref 4.22–5.81)
RDW: 19.6 % — ABNORMAL HIGH (ref 11.5–15.5)
WBC: 6.7 10*3/uL (ref 4.0–10.5)
nRBC: 0 % (ref 0.0–0.2)

## 2021-02-24 LAB — GLUCOSE, CAPILLARY
Glucose-Capillary: 78 mg/dL (ref 70–99)
Glucose-Capillary: 41 mg/dL — CL (ref 70–99)
Glucose-Capillary: 108 mg/dL — ABNORMAL HIGH (ref 70–99)
Glucose-Capillary: 49 mg/dL — ABNORMAL LOW (ref 70–99)

## 2021-02-24 LAB — HEMOGLOBIN AND HEMATOCRIT, BLOOD
HCT: 24.6 % — ABNORMAL LOW (ref 39.0–52.0)
Hemoglobin: 7.2 g/dL — ABNORMAL LOW (ref 13.0–17.0)

## 2021-02-24 LAB — MAGNESIUM: Magnesium: 2.1 mg/dL (ref 1.7–2.4)

## 2021-02-24 LAB — PREPARE RBC (CROSSMATCH)

## 2021-02-24 MED ORDER — PANTOPRAZOLE SODIUM 40 MG IV SOLR
40.0000 mg | Freq: Two times a day (BID) | INTRAVENOUS | Status: DC
  Administered 2021-02-24 – 2021-02-27 (×6): 40 mg via INTRAVENOUS
  Filled 2021-02-24 (×6): qty 40

## 2021-02-24 MED ORDER — LORAZEPAM 1 MG PO TABS
1.0000 mg | ORAL_TABLET | Freq: Once | ORAL | Status: AC
  Administered 2021-02-24: 1 mg via ORAL
  Filled 2021-02-24: qty 1

## 2021-02-24 MED ORDER — SODIUM CHLORIDE 0.9% IV SOLUTION: Freq: Once | INTRAVENOUS | Status: DC

## 2021-02-24 NOTE — ED Notes (Signed)
Blood bank called and said the blood is ready reported to RN

## 2021-02-24 NOTE — Telephone Encounter (Signed)
This was completed

## 2021-02-24 NOTE — ED Notes (Signed)
Gave bedside report to [place], [place]

## 2021-02-24 NOTE — Progress Notes (Signed)
PROGRESS NOTE    Jordan [name]  [id number] DOB: [birth year] DOA: 02/23/2021 PCP: [name], MD     Brief Narrative:  Jordan [name] is a 70 y.o. male with medical history significant for COPD, chronic hypoxic respiratory failure, coronary artery disease, AS s/p TAVR, chronic venous stasis, and atrial fibrillation on Eliquis, who presented to the ER with acute onset of shortness of breath and palpitations.  Patient was admitted to the hospital in July 2022 with cellulitis, developed new onset atrial fibrillation during that admission with rates as high as 130s, was started on Eliquis, and atenolol was changed to metoprolol at that time.  He had been doing fairly well back at home before developing shortness of breath and rapid palpitations prior to admission.     Upon arrival to the ED, patient is found to be afebrile, saturating mid to upper 90s on 3 L/min of supplemental oxygen, tachycardic to 130, and with blood pressure 92/68.  EKG features atrial fibrillation with RVR.   Chest x-ray with resolution of previously noted pulmonary edema, small right pleural effusion, and interval TAVR.  Chemistry panel is unremarkable and CBC notable for a stable chronic normocytic anemia.  Troponin was normal x2 and BNP elevated at 933.   Patient was treated with Ativan, 20 mg IV diltiazem injection, and started on diltiazem infusion in the ED.  Rate control was achieved but began sustaining RVR again once the diltiazem infusion was stopped.  Diltiazem infusion was resumed. His metoprolol was further uptitrated from his home dose and Cardizem drip was able to be weaned off.  New events last 24 hours / Subjective: Patient has no complaints this morning.  His hemoglobin had dropped to 6.5 and received 1 unit packed red blood cell.  Patient denies any blood loss, denies any epistaxis, hematemesis, hemoptysis, or any dark or bloody stools or hematuria.  Assessment & Plan:   Principal Problem:   Atrial  fibrillation with RVR (HCC) Active Problems:   Chronic venous insufficiency   Diabetes mellitus without complication (HCC)   COPD (chronic obstructive pulmonary disease) (HCC)   CAD (coronary artery disease)   (HFpEF) heart failure with preserved ejection fraction (HCC)   Anxiety   Chronic respiratory failure with hypoxia (HCC)   A. fib RVR -Now off Cardizem drip -Continue Lopressor at increased dose -Eliquis on hold due to drop in hemoglobin -Rate well controlled in the 90s today  Normocytic anemia -Baseline hemoglobin appears to be around 8 -Unclear if he has had any overt blood loss -FOBT ordered -Eliquis on hold.  Patient states that he is no longer on aspirin.  Stop aspirin.  Chronic diastolic heart failure -Continue Lasix, Lopressor  COPD with chronic hypoxemic respiratory failure -Stable, continue Dulera, DuoNeb as needed, nicotine patch  CAD -Continue lipitor, Lopressor  DVT prophylaxis: Eliquis on hold   Code Status:     Code Status Orders  (From admission, onward)           Start     Ordered   02/23/21 0523  Full code  Continuous        02/23/21 0524           Code Status History     Date Active Date Inactive Code Status Order ID Comments User Context   12/29/2020 2304 01/05/2021 2212 Full Code [id number]  Bernadette Hoit, DO ED   06/30/2020 1557 07/03/2020 1649 Full Code [id number]  Eileen Stanford, PA-C Inpatient   03/03/2020 1907 03/12/2020 2203 Full Code  [id number]  Neena Rhymes, MD ED   03/14/2017 0846 03/20/2017 2308 Full Code [id number]  Reyne Dumas, MD ED   10/30/2014 2043 11/04/2014 1920 Full Code [id number]  Allyne Gee, MD Inpatient   09/13/2012 1159 09/17/2012 1412 Full Code [id number]  Sheela Stack, MD Inpatient      Family Communication: No family at bedside Disposition Plan:  Status is: Inpatient  Remains inpatient appropriate because:Inpatient level of care appropriate due to severity of illness  Dispo: The patient  is from: Home              Anticipated d/c is to: Home              Patient currently is not medically stable to d/c.  Transfuse blood today and hold Eliquis.   Difficult to place patient No      Antimicrobials:  Anti-infectives (From admission, onward)    None        Objective: Vitals:   02/24/21 1245 02/24/21 1302 02/24/21 1330 02/24/21 1400  BP: 122/78 129/75 140/70 121/69  Pulse: 92 84 78 68  Resp:  '19 17 17  '$ Temp:      TempSrc:      SpO2: 100% 100% 100% 100%  Weight:      Height:        Intake/Output Summary (Last 24 hours) at 02/24/2021 1438 Last data filed at 02/23/2021 1840 Gross per 24 hour  Intake --  Output 500 ml  Net -500 ml   Filed Weights   02/23/21 0113  Weight: 102.1 kg    Examination:  General exam: Appears calm and comfortable  Respiratory system: Clear to auscultation. Respiratory effort normal. No respiratory distress. No conversational dyspnea.  Cardiovascular system: S1 & S2 heard, irregular rhythm rate 90s.  Gastrointestinal system: Abdomen is nondistended, soft and nontender. Normal bowel sounds heard. Central nervous system: Alert and oriented. No focal neurological deficits. Speech clear.  Extremities: Symmetric in appearance  Skin: No rashes, lesions or ulcers on exposed skin  Psychiatry: Judgement and insight appear normal. Mood & affect appropriate.   Data Reviewed: I have personally reviewed following labs and imaging studies  CBC: Recent Labs  Lab 02/23/21 0127 02/24/21 0412 02/24/21 1120  WBC 8.7 6.7  --   NEUTROABS 6.3  --   --   HGB 8.2* 6.5* 7.2*  HCT 28.3* 22.9* 24.6*  MCV 94.6 94.2  --   PLT 351 234  --    Basic Metabolic Panel: Recent Labs  Lab 02/23/21 0127 02/24/21 0412  NA 136 138  K 4.4 4.3  CL 107 107  CO2 24 27  GLUCOSE 105* 103*  BUN 21 19  CREATININE 1.03 1.05  CALCIUM 8.2* 8.1*  MG  --  2.1   GFR: Estimated Creatinine Clearance: 80.9 mL/min (by C-G formula based on SCr of 1.05  mg/dL). Liver Function Tests: No results for input(s): AST, ALT, ALKPHOS, BILITOT, PROT, ALBUMIN in the last 168 hours. No results for input(s): LIPASE, AMYLASE in the last 168 hours. No results for input(s): AMMONIA in the last 168 hours. Coagulation Profile: No results for input(s): INR, PROTIME in the last 168 hours. Cardiac Enzymes: No results for input(s): CKTOTAL, CKMB, CKMBINDEX, TROPONINI in the last 168 hours. BNP (last 3 results) No results for input(s): PROBNP in the last 8760 hours. HbA1C: No results for input(s): HGBA1C in the last 72 hours. CBG: No results for input(s): GLUCAP in the last 168 hours. Lipid Profile: No results for input(s):  CHOL, HDL, LDLCALC, TRIG, CHOLHDL, LDLDIRECT in the last 72 hours. Thyroid Function Tests: No results for input(s): TSH, T4TOTAL, FREET4, T3FREE, THYROIDAB in the last 72 hours. Anemia Panel: No results for input(s): VITAMINB12, FOLATE, FERRITIN, TIBC, IRON, RETICCTPCT in the last 72 hours. Sepsis Labs: No results for input(s): PROCALCITON, LATICACIDVEN in the last 168 hours.  Recent Results (from the past 240 hour(s))  Resp Panel by RT-PCR (Flu A&B, Covid) Nasopharyngeal Swab     Status: None   Collection Time: 02/23/21  4:47 AM   Specimen: Nasopharyngeal Swab; Nasopharyngeal(NP) swabs in vial transport medium  Result Value Ref Range Status   SARS Coronavirus 2 by RT PCR NEGATIVE NEGATIVE Final    Comment: (NOTE) SARS-CoV-2 target nucleic acids are NOT DETECTED.  The SARS-CoV-2 RNA is generally detectable in upper respiratory specimens during the acute phase of infection. The lowest concentration of SARS-CoV-2 viral copies this assay can detect is 138 copies/mL. A negative result does not preclude SARS-Cov-2 infection and should not be used as the sole basis for treatment or other patient management decisions. A negative result may occur with  improper specimen collection/handling, submission of specimen other than  nasopharyngeal swab, presence of viral mutation(s) within the areas targeted by this assay, and inadequate number of viral copies(<138 copies/mL). A negative result must be combined with clinical observations, patient history, and epidemiological information. The expected result is Negative.  Fact Sheet for Patients:  EntrepreneurPulse.com.au  Fact Sheet for Healthcare Providers:  IncredibleEmployment.be  This test is no t yet approved or cleared by the [place] FDA and  has been authorized for detection and/or diagnosis of SARS-CoV-2 by FDA under an Emergency Use Authorization (EUA). This EUA will remain  in effect (meaning this test can be used) for the duration of the COVID-19 declaration under Section 564(b)(1) of the Act, 21 U.S.C.section 360bbb-3(b)(1), unless the authorization is terminated  or revoked sooner.       Influenza A by PCR NEGATIVE NEGATIVE Final   Influenza B by PCR NEGATIVE NEGATIVE Final    Comment: (NOTE) The Xpert Xpress SARS-CoV-2/FLU/RSV plus assay is intended as an aid in the diagnosis of influenza from Nasopharyngeal swab specimens and should not be used as a sole basis for treatment. Nasal washings and aspirates are unacceptable for Xpert Xpress SARS-CoV-2/FLU/RSV testing.  Fact Sheet for Patients: EntrepreneurPulse.com.au  Fact Sheet for Healthcare Providers: IncredibleEmployment.be  This test is not yet approved or cleared by the [place] FDA and has been authorized for detection and/or diagnosis of SARS-CoV-2 by FDA under an Emergency Use Authorization (EUA). This EUA will remain in effect (meaning this test can be used) for the duration of the COVID-19 declaration under Section 564(b)(1) of the Act, 21 U.S.C. section 360bbb-3(b)(1), unless the authorization is terminated or revoked.  Performed at [facility], [place] [address] [place], [place] [address]       Radiology Studies: DG Chest 2 View  Result Date: 02/23/2021 CLINICAL DATA:  Dyspnea, atrial fibrillation, COPD. EXAM: CHEST - 2 VIEW COMPARISON:  01/02/2021 FINDINGS: Lungs are clear. No pneumothorax. Small right pleural effusion. Resolution of previously noted pulmonary edema. Transcatheter aortic valve replacement has been performed in the interval. Cardiac size is within normal limits. Osseous structures are age-appropriate. IMPRESSION: Interval transcatheter aortic valve replacement. Resolution of previously noted pulmonary edema. Small right pleural effusion. Electronically Signed   By: Fidela Salisbury M.D.   On: 02/23/2021 01:51      Scheduled Meds:  sodium chloride  Intravenous Once   atorvastatin  80 mg Oral QPC supper   furosemide  40 mg Oral Daily   metoprolol tartrate  75 mg Oral BID   mometasone-formoterol  2 puff Inhalation BID   montelukast  10 mg Oral QHS   nicotine  21 mg Transdermal Daily   pantoprazole  40 mg Oral Daily   venlafaxine XR  75 mg Oral BID   Continuous Infusions:   LOS: 1 day      Time spent: 25 minutes   Dessa Phi, DO [facility] 02/24/2021, 2:38 PM   Available via Epic secure chat 7am-7pm After these hours, please refer to coverage provider listed on [email or web address]

## 2021-02-25 LAB — TYPE AND SCREEN
ABO/RH(D): O POS
Antibody Screen: NEGATIVE
Unit division: 0

## 2021-02-25 LAB — CBC
HCT: 26 % — ABNORMAL LOW (ref 39.0–52.0)
Hemoglobin: 7.8 g/dL — ABNORMAL LOW (ref 13.0–17.0)
MCH: 27.5 pg (ref 26.0–34.0)
MCHC: 30 g/dL (ref 30.0–36.0)
MCV: 91.5 fL (ref 80.0–100.0)
Platelets: 247 10*3/uL (ref 150–400)
RBC: 2.84 MIL/uL — ABNORMAL LOW (ref 4.22–5.81)
RDW: 18.2 % — ABNORMAL HIGH (ref 11.5–15.5)
WBC: 6.7 10*3/uL (ref 4.0–10.5)
nRBC: 0 % (ref 0.0–0.2)

## 2021-02-25 LAB — GLUCOSE, CAPILLARY
Glucose-Capillary: 92 mg/dL (ref 70–99)
Glucose-Capillary: 78 mg/dL (ref 70–99)
Glucose-Capillary: 92 mg/dL (ref 70–99)
Glucose-Capillary: 112 mg/dL — ABNORMAL HIGH (ref 70–99)
Glucose-Capillary: 118 mg/dL — ABNORMAL HIGH (ref 70–99)

## 2021-02-25 LAB — BPAM RBC
Blood Product Expiration Date: 202210032359
ISSUE DATE / TIME: 202209070718
Unit Type and Rh: 5100

## 2021-02-25 NOTE — Progress Notes (Signed)
02/25/21 0810  Charting Type  Charting Type Shift assessment  Assessment of needs addressed Yes  Orders Chart Check (once per shift) Completed  [place] Work Intensity Score (Update with each assessment and as needed)  Work Intensity Score (Level) 2  Level 2 Intensity C.Needs assistance with ADLs and mobility  Glasgow Coma Scale  Eye Opening 4  Best Verbal Response (NON-intubated) 5  Best Motor Response 6  Glasgow Coma Scale Score 15  NuDESC - Delirium Risk Factor Assessment (Complete for non-ICU patients)  Delirium Risk Factor Assessment Age greater than or equal to 6 years  NuDESC - Nursing Delirium Screening Scale (Complete for non-ICU patients)  Disorientation 0  Inappropriate Behavior 0  Inappropriate Communications 0  Illusions/hallucinations 0  Psychomotor Retardation 0  NuDESC Total Score 0  NuDESC - Delirium Prevention:  Universal Requirements (Complete for all non-ICU patients with a delirium risk factor)  Universal Precautions Initiated *See Row Information* Yes  HEENT  HEENT (WDL) WDL  Teeth Missing (Comment)  Tongue Pink;Moist  Mucous Membrane(s) Moist  Voice Clear  Respiratory  Respiratory Pattern Regular;Unlabored  Bilateral Breath Sounds Diminished  Cardiac  Cardiac (WDL) WDL  ECG Monitoring  Cardiac Rhythm Atrial fibrillation  Antiarrhythmic device  Antiarrhythmic device No  Vascular  Vascular (WDL) X  Cyanosis None  Capillary Refill Less than 3 seconds  Pulses R dorsalis pedis;L dorsalis pedis;R radial;L radial  Edema Right lower extremity;Left lower extremity  RLE Edema +1  LLE Edema +1  RUE Neurovascular Assessment  R Radial Pulse +2  LUE Neurovascular Assessment  L Radial Pulse +2  RLE Neurovascular Assessment  R Dorsalis Pedis Pulse +2  LLE Neurovascular Assessment  L Dorsalis Pedis Pulse +2  Integumentary  Integumentary (WDL) X  Skin Color Appropriate for ethnicity  Skin Condition Dry;Flaky  Skin Integrity Cellulitis   Cellulitis Location Leg  Cellulitis Location Orientation Bilateral  Skin Turgor Non-tenting  Braden Scale (Ages 8 and up)  Sensory Perceptions 4  Moisture 3  Activity 2  Mobility 2  Nutrition 3  Friction and Shear 2  Braden Scale Score 16  Braden Interventions  Braden Scale Interventions Pressure Injury Prevention Handout  Musculoskeletal  Musculoskeletal (WDL) X  Assistive Device None  Generalized Weakness Yes  Weight Bearing Restrictions No  Gastrointestinal  Gastrointestinal (WDL) WDL  GU Assessment  Genitourinary (WDL) X  Genitourinary Symptoms Existing external catheter  Genitalia  Male Genitalia Intact  Urine Characteristics  Urinary Incontinence Yes  Urine Color Yellow/straw  Urine Appearance Clear  External Urinary Catheter  Placement Date/Time: 02/23/21 0912   Person Inserting LDA: [id number]  External Urinary Catheter Type: Male  Uncircumcised Patients ONLY: Foreskin returned to normal position after Health visitor Standard drainage bag  Securement Method Leg strap  Site Assessment Clean;Intact  Psychosocial  Psychosocial (WDL) WDL  Wound/Incision (LDAs)  Type of Wound/Incision (LDA) Pressure injury  Pressure Injury 12/30/20 Ankle Anterior;Left Unstageable - Full thickness tissue loss in which the base of the injury is covered by slough (yellow, tan, gray, green or brown) and/or eschar (tan, brown or black) in the wound bed. MDRPI from Unna's boot  Date First Assessed/Time First Assessed: 12/30/20 1422   Location: Ankle  Location Orientation: Anterior;Left  Staging: Unstageable - Full thickness tissue loss in which the base of the injury is covered by slough (yellow, tan, gray, green or brown) a...  Dressing Clean;Dry;Intact  Dressing Change Frequency Monday, Wednesday, Friday  Pressure Injury 01/01/21 Coccyx Mid tearing also present  Date First  Assessed/Time First Assessed: 01/01/21 1000   Location: Coccyx  Location Orientation: Mid  Wound  Description (Comments): tearing also present  Dressing Type Foam - Lift dressing to assess site every shift  Site / Wound Assessment Clean;Dry  Neurological  Level of Consciousness Alert    I agree with this patient's assessment

## 2021-02-25 NOTE — Consult Note (Signed)
WOC consulted for Unna's boots orders. Reviewed chart patient has been followed by Dr. Sharol Given in the past and has new orders for Unna's boots at home started this week on Tuesday.  Supplies did not arrive to patient's home for change PTA.  He prefers M/W/F scheduled and reports wounds have almost healed over.  Will update POC for Unna's boots to be change M/W/F beginning tomorrow 02/26/21 per orthopedic techs.  Bedside nurse to follow unna boot order set and to page orthopedic tech to change tomorrow.   Discussed POC with patient and bedside nurse.  Re consult if needed, will not follow at this time. Thanks  Mckennon Zwart [employer], RN,CWOCN, CNS, [place] [phone])

## 2021-02-25 NOTE — Progress Notes (Signed)
PROGRESS NOTE    Jordan [name]  [id number] DOB: 02/07/1951 DOA: 02/23/2021 PCP: [name], MD     Brief Narrative:  Jordan [name] is a 70 y.o. male with medical history significant for COPD, chronic hypoxic respiratory failure, coronary artery disease, AS s/p TAVR, chronic venous stasis, and atrial fibrillation on Eliquis, who presented to the ER with acute onset of shortness of breath and palpitations.  Patient was admitted to the hospital in July 2022 with cellulitis, developed new onset atrial fibrillation during that admission with rates as high as 130s, was started on Eliquis, and atenolol was changed to metoprolol at that time.  He had been doing fairly well back at home before developing shortness of breath and rapid palpitations prior to admission.     Upon arrival to the ED, patient is found to be afebrile, saturating mid to upper 90s on 3 L/min of supplemental oxygen, tachycardic to 130, and with blood pressure 92/68.  EKG features atrial fibrillation with RVR.   Chest x-ray with resolution of previously noted pulmonary edema, small right pleural effusion, and interval TAVR.  Chemistry panel is unremarkable and CBC notable for a stable chronic normocytic anemia.  Troponin was normal x2 and BNP elevated at 933.   Patient was treated with Ativan, 20 mg IV diltiazem injection, and started on diltiazem infusion in the ED.  Rate control was achieved but began sustaining RVR again once the diltiazem infusion was stopped.  Diltiazem infusion was resumed. His metoprolol was further uptitrated from his home dose and Cardizem drip was able to be weaned off.  New events last 24 hours / Subjective: Patient has no complaints, has not had a bowel movement, no overt source of bleeding.  Hopeful to work with PT and gain some strength.  Assessment & Plan:   Principal Problem:   Atrial fibrillation with RVR (HCC) Active Problems:   Chronic venous insufficiency   Diabetes mellitus  without complication (HCC)   COPD (chronic obstructive pulmonary disease) (HCC)   CAD (coronary artery disease)   (HFpEF) heart failure with preserved ejection fraction (HCC)   Anxiety   Chronic respiratory failure with hypoxia (HCC)   A. fib RVR -Now off Cardizem drip -Continue Lopressor at increased dose -Eliquis on hold due to drop in hemoglobin -Rate well controlled in the 70s today  Normocytic anemia -Baseline hemoglobin appears to be around 8 -Unclear if he has had any overt blood loss -FOBT ordered but patient has not had a bowel movement -Eliquis on hold.  Patient states that he is no longer on aspirin.  Stop aspirin. -Hemoglobin stable overnight  Chronic diastolic heart failure -Continue Lasix, Lopressor  COPD with chronic hypoxemic respiratory failure -Stable, continue Dulera, DuoNeb as needed, nicotine patch -Uses 2 L nasal cannula O2 at baseline  CAD -Continue lipitor, Lopressor  Generalized weakness -PT OT  DVT prophylaxis: Eliquis on hold   Code Status:     Code Status Orders  (From admission, onward)           Start     Ordered   02/23/21 0523  Full code  Continuous        02/23/21 0524           Code Status History     Date Active Date Inactive Code Status Order ID Comments User Context   12/29/2020 2304 01/05/2021 2212 Full Code [id number]  Bernadette Hoit, DO ED   06/30/2020 1557 07/03/2020 1649 Full Code [id number]  Eileen Stanford, PA-C  Inpatient   03/03/2020 1907 03/12/2020 2203 Full Code [id number]  Neena Rhymes, MD ED   03/14/2017 0846 03/20/2017 2308 Full Code [id number]  Reyne Dumas, MD ED   10/30/2014 2043 11/04/2014 1920 Full Code [id number]  Allyne Gee, MD Inpatient   09/13/2012 1159 09/17/2012 1412 Full Code [id number]  Sheela Stack, MD Inpatient      Family Communication: No family at bedside Disposition Plan:  Status is: Inpatient  Remains inpatient appropriate because:Inpatient level of care appropriate due to  severity of illness  Dispo: The patient is from: Home              Anticipated d/c is to: Home              Patient currently is not medically stable to d/c.  Transfuse blood today and hold Eliquis.   Difficult to place patient No      Antimicrobials:  Anti-infectives (From admission, onward)    None        Objective: Vitals:   02/25/21 0411 02/25/21 0803 02/25/21 0810 02/25/21 1133  BP: 121/78 135/79  (!) 104/55  Pulse: 76 89  82  Resp: '16 16  16  '$ Temp: 97.8 F (36.6 C) 98.1 F (36.7 C)  98.1 F (36.7 C)  TempSrc: Oral Oral  Oral  SpO2: 99% 100% 99% 99%  Weight:      Height:        Intake/Output Summary (Last 24 hours) at 02/25/2021 1248 Last data filed at 02/25/2021 0900 Gross per 24 hour  Intake 1132.46 ml  Output 1850 ml  Net -717.54 ml    Filed Weights   02/23/21 0113 02/24/21 1600  Weight: 102.1 kg 75.9 kg    Examination: General exam: Appears calm and comfortable  Respiratory system: Clear to auscultation. Respiratory effort normal. Cardiovascular system: S1 & S2 heard, irregular rhythm rate 70s Gastrointestinal system: Abdomen is nondistended, soft and nontender. Normal bowel sounds heard. Central nervous system: Alert and oriented. Non focal exam. Speech clear  Extremities: Symmetric in appearance bilaterally  Skin: No rashes, lesions or ulcers on exposed skin  Psychiatry: Judgement and insight appear stable. Mood & affect appropriate.    Data Reviewed: I have personally reviewed following labs and imaging studies  CBC: Recent Labs  Lab 02/23/21 0127 02/24/21 0412 02/24/21 1120 02/25/21 0354  WBC 8.7 6.7  --  6.7  NEUTROABS 6.3  --   --   --   HGB 8.2* 6.5* 7.2* 7.8*  HCT 28.3* 22.9* 24.6* 26.0*  MCV 94.6 94.2  --  91.5  PLT 351 234  --  [id number]    Basic Metabolic Panel: Recent Labs  Lab 02/23/21 0127 02/24/21 0412  NA 136 138  K 4.4 4.3  CL 107 107  CO2 24 27  GLUCOSE 105* 103*  BUN 21 19  CREATININE 1.03 1.05  CALCIUM 8.2*  8.1*  MG  --  2.1    GFR: Estimated Creatinine Clearance: 70.3 mL/min (by C-G formula based on SCr of 1.05 mg/dL). Liver Function Tests: No results for input(s): AST, ALT, ALKPHOS, BILITOT, PROT, ALBUMIN in the last 168 hours. No results for input(s): LIPASE, AMYLASE in the last 168 hours. No results for input(s): AMMONIA in the last 168 hours. Coagulation Profile: No results for input(s): INR, PROTIME in the last 168 hours. Cardiac Enzymes: No results for input(s): CKTOTAL, CKMB, CKMBINDEX, TROPONINI in the last 168 hours. BNP (last 3 results) No results for input(s): PROBNP in the last 8760  hours. HbA1C: No results for input(s): HGBA1C in the last 72 hours. CBG: Recent Labs  Lab 02/24/21 2042 02/24/21 2053 02/25/21 0413 02/25/21 0730 02/25/21 1128  GLUCAP 49* 108* 92 78 92   Lipid Profile: No results for input(s): CHOL, HDL, LDLCALC, TRIG, CHOLHDL, LDLDIRECT in the last 72 hours. Thyroid Function Tests: No results for input(s): TSH, T4TOTAL, FREET4, T3FREE, THYROIDAB in the last 72 hours. Anemia Panel: No results for input(s): VITAMINB12, FOLATE, FERRITIN, TIBC, IRON, RETICCTPCT in the last 72 hours. Sepsis Labs: No results for input(s): PROCALCITON, LATICACIDVEN in the last 168 hours.  Recent Results (from the past 240 hour(s))  Resp Panel by RT-PCR (Flu A&B, Covid) Nasopharyngeal Swab     Status: None   Collection Time: 02/23/21  4:47 AM   Specimen: Nasopharyngeal Swab; Nasopharyngeal(NP) swabs in vial transport medium  Result Value Ref Range Status   SARS Coronavirus 2 by RT PCR NEGATIVE NEGATIVE Final    Comment: (NOTE) SARS-CoV-2 target nucleic acids are NOT DETECTED.  The SARS-CoV-2 RNA is generally detectable in upper respiratory specimens during the acute phase of infection. The lowest concentration of SARS-CoV-2 viral copies this assay can detect is 138 copies/mL. A negative result does not preclude SARS-Cov-2 infection and should not be used as the sole  basis for treatment or other patient management decisions. A negative result may occur with  improper specimen collection/handling, submission of specimen other than nasopharyngeal swab, presence of viral mutation(s) within the areas targeted by this assay, and inadequate number of viral copies(<138 copies/mL). A negative result must be combined with clinical observations, patient history, and epidemiological information. The expected result is Negative.  Fact Sheet for Patients:  EntrepreneurPulse.com.au  Fact Sheet for Healthcare Providers:  IncredibleEmployment.be  This test is no t yet approved or cleared by the [place] FDA and  has been authorized for detection and/or diagnosis of SARS-CoV-2 by FDA under an Emergency Use Authorization (EUA). This EUA will remain  in effect (meaning this test can be used) for the duration of the COVID-19 declaration under Section 564(b)(1) of the Act, 21 U.S.C.section 360bbb-3(b)(1), unless the authorization is terminated  or revoked sooner.       Influenza A by PCR NEGATIVE NEGATIVE Final   Influenza B by PCR NEGATIVE NEGATIVE Final    Comment: (NOTE) The Xpert Xpress SARS-CoV-2/FLU/RSV plus assay is intended as an aid in the diagnosis of influenza from Nasopharyngeal swab specimens and should not be used as a sole basis for treatment. Nasal washings and aspirates are unacceptable for Xpert Xpress SARS-CoV-2/FLU/RSV testing.  Fact Sheet for Patients: EntrepreneurPulse.com.au  Fact Sheet for Healthcare Providers: IncredibleEmployment.be  This test is not yet approved or cleared by the [place] FDA and has been authorized for detection and/or diagnosis of SARS-CoV-2 by FDA under an Emergency Use Authorization (EUA). This EUA will remain in effect (meaning this test can be used) for the duration of the COVID-19 declaration under Section 564(b)(1) of the Act,  21 U.S.C. section 360bbb-3(b)(1), unless the authorization is terminated or revoked.  Performed at [facility], [place] [address], [place], [place] [address]        Radiology Studies: No results found.    Scheduled Meds:  sodium chloride   Intravenous Once   atorvastatin  80 mg Oral QPC supper   furosemide  40 mg Oral Daily   metoprolol tartrate  75 mg Oral BID   mometasone-formoterol  2 puff Inhalation BID   montelukast  10 mg Oral QHS  nicotine  21 mg Transdermal Daily   pantoprazole (PROTONIX) IV  40 mg Intravenous Q12H   venlafaxine XR  75 mg Oral BID   Continuous Infusions:   LOS: 2 days      Time spent: 20 minutes   Dessa Phi, DO [facility] 02/25/2021, 12:48 PM   Available via Epic secure chat 7am-7pm After these hours, please refer to coverage provider listed on [email or web address]

## 2021-02-26 LAB — CBC
HCT: 27.4 % — ABNORMAL LOW (ref 39.0–52.0)
Hemoglobin: 8.4 g/dL — ABNORMAL LOW (ref 13.0–17.0)
MCH: 27.9 pg (ref 26.0–34.0)
MCHC: 30.7 g/dL (ref 30.0–36.0)
MCV: 91 fL (ref 80.0–100.0)
Platelets: 260 10*3/uL (ref 150–400)
RBC: 3.01 MIL/uL — ABNORMAL LOW (ref 4.22–5.81)
RDW: 17.7 % — ABNORMAL HIGH (ref 11.5–15.5)
WBC: 7.2 10*3/uL (ref 4.0–10.5)
nRBC: 0 % (ref 0.0–0.2)

## 2021-02-26 LAB — GLUCOSE, CAPILLARY
Glucose-Capillary: 77 mg/dL (ref 70–99)
Glucose-Capillary: 122 mg/dL — ABNORMAL HIGH (ref 70–99)
Glucose-Capillary: 112 mg/dL — ABNORMAL HIGH (ref 70–99)
Glucose-Capillary: 100 mg/dL — ABNORMAL HIGH (ref 70–99)

## 2021-02-26 MED ORDER — APIXABAN 5 MG PO TABS
5.0000 mg | ORAL_TABLET | Freq: Two times a day (BID) | ORAL | Status: DC
  Administered 2021-02-26 – 2021-02-27 (×3): 5 mg via ORAL
  Filled 2021-02-26 (×3): qty 1

## 2021-02-26 MED ORDER — INFLUENZA VAC A&B SA ADJ QUAD 0.5 ML IM PRSY
0.5000 mL | PREFILLED_SYRINGE | Freq: Once | INTRAMUSCULAR | Status: AC
  Administered 2021-02-26: 0.5 mL via INTRAMUSCULAR
  Filled 2021-02-26: qty 0.5

## 2021-02-26 NOTE — Plan of Care (Signed)
  Problem: Clinical Measurements: Goal: Respiratory complications will improve Outcome: Progressing Goal: Cardiovascular complication will be avoided Outcome: Progressing   Problem: Nutrition: Goal: Adequate nutrition will be maintained Outcome: Progressing   Problem: Coping: Goal: Level of anxiety will decrease Outcome: Progressing   Problem: Skin Integrity: Goal: Risk for impaired skin integrity will decrease Outcome: Progressing

## 2021-02-26 NOTE — Progress Notes (Signed)
Orthopedic Tech Progress Note Patient Details:  Jordan [name] [birth year] [id number]  Patient ID: Jordan [name], male   DOB: May 20, 1951, 70 y.o.   MRN: [id number]  Jordan [name] 02/26/2021, 2:48 PM Bi lat unna boot applied

## 2021-02-26 NOTE — Progress Notes (Signed)
PROGRESS NOTE    Jordan [name]  [id number] DOB: March 02, 1951 DOA: 02/23/2021 PCP: [name], MD     Brief Narrative:  Jordan [name] is a 70 y.o. male with medical history significant for COPD, chronic hypoxic respiratory failure, coronary artery disease, AS s/p TAVR, chronic venous stasis, and atrial fibrillation on Eliquis, who presented to the ER with acute onset of shortness of breath and palpitations.  Patient was admitted to the hospital in July 2022 with cellulitis, developed new onset atrial fibrillation during that admission with rates as high as 130s, was started on Eliquis, and atenolol was changed to metoprolol at that time.  He had been doing fairly well back at home before developing shortness of breath and rapid palpitations prior to admission.     Upon arrival to the ED, patient is found to be afebrile, saturating mid to upper 90s on 3 L/min of supplemental oxygen, tachycardic to 130, and with blood pressure 92/68.  EKG features atrial fibrillation with RVR.   Chest x-ray with resolution of previously noted pulmonary edema, small right pleural effusion, and interval TAVR.  Chemistry panel is unremarkable and CBC notable for a stable chronic normocytic anemia.  Troponin was normal x2 and BNP elevated at 933.   Patient was treated with Ativan, 20 mg IV diltiazem injection, and started on diltiazem infusion in the ED.  Rate control was achieved but began sustaining RVR again once the diltiazem infusion was stopped.  Diltiazem infusion was resumed. His metoprolol was further uptitrated from his home dose and Cardizem drip was able to be weaned off.  New events last 24 hours / Subjective: Patient sitting in chair this morning.  Work with OT, was able to ambulate to the hallway, participate in washing up.  States that he is still feeling weak, does not want to go back home until his strength has improved.  Has not had a bowel movement.  Wants to get a flu shot.  Assessment &  Plan:   Principal Problem:   Atrial fibrillation with RVR (HCC) Active Problems:   Chronic venous insufficiency   Diabetes mellitus without complication (HCC)   COPD (chronic obstructive pulmonary disease) (HCC)   CAD (coronary artery disease)   (HFpEF) heart failure with preserved ejection fraction (HCC)   Anxiety   Chronic respiratory failure with hypoxia (HCC)   A. fib RVR -Now off Cardizem drip -Continue Lopressor at increased dose -Resume Eliquis -Rate well controlled   Normocytic anemia -Baseline hemoglobin appears to be around 8 -Unclear if he has had any overt blood loss -FOBT ordered but patient has not had a bowel movement -Received 1 unit packed red blood cell 9/7 -Resume Eliquis -Hemoglobin has remained stable for several days  Chronic diastolic heart failure -Continue Lasix, Lopressor  COPD with chronic hypoxemic respiratory failure -Stable, continue Dulera, DuoNeb as needed, nicotine patch -Uses 2 L nasal cannula O2 at baseline  CAD -Continue lipitor, Lopressor  Generalized weakness -PT OT recommending home health PT OT  DVT prophylaxis:   apixaban (ELIQUIS) tablet 5 mg  Code Status:     Code Status Orders  (From admission, onward)           Start     Ordered   02/23/21 0523  Full code  Continuous        02/23/21 0524           Code Status History     Date Active Date Inactive Code Status Order ID Comments User Context  12/29/2020 2304 01/05/2021 2212 Full Code [id number]  Bernadette Hoit, DO ED   06/30/2020 1557 07/03/2020 1649 Full Code [id number]  Eileen Stanford, PA-C Inpatient   03/03/2020 1907 03/12/2020 2203 Full Code [id number]  Neena Rhymes, MD ED   03/14/2017 0846 03/20/2017 2308 Full Code [id number]  Reyne Dumas, MD ED   10/30/2014 2043 11/04/2014 1920 Full Code [id number]  Allyne Gee, MD Inpatient   09/13/2012 1159 09/17/2012 1412 Full Code [id number]  Sheela Stack, MD Inpatient      Family Communication: No  family at bedside Disposition Plan:  Status is: Inpatient  Remains inpatient appropriate because:Inpatient level of care appropriate due to severity of illness  Dispo: The patient is from: Home              Anticipated d/c is to: Home              Patient currently is not medically stable to d/c.  Hopeful discharge home tomorrow with home health.   Difficult to place patient No      Antimicrobials:  Anti-infectives (From admission, onward)    None        Objective: Vitals:   02/25/21 2123 02/26/21 0100 02/26/21 0602 02/26/21 0759  BP: (!) 94/53 (!) 105/92 114/60   Pulse: 88 86 80   Resp:  16 17   Temp:  97.9 F (36.6 C) 98.1 F (36.7 C)   TempSrc:  Oral Oral   SpO2:  98% 99% 99%  Weight:      Height:        Intake/Output Summary (Last 24 hours) at 02/26/2021 1355 Last data filed at 02/26/2021 0600 Gross per 24 hour  Intake 120 ml  Output 4250 ml  Net -4130 ml    Filed Weights   02/23/21 0113 02/24/21 1600  Weight: 102.1 kg 75.9 kg    Examination: General exam: Appears calm and comfortable  Respiratory system: Clear to auscultation. Respiratory effort normal. Cardiovascular system: S1 & S2 heard, irregular rhythm.  Gastrointestinal system: Abdomen is nondistended, soft and nontender. Normal bowel sounds heard. Central nervous system: Alert and oriented. Non focal exam. Speech clear  Extremities: Symmetric in appearance bilaterally  Psychiatry: Judgement and insight appear stable. Mood & affect appropriate.     Data Reviewed: I have personally reviewed following labs and imaging studies  CBC: Recent Labs  Lab 02/23/21 0127 02/24/21 0412 02/24/21 1120 02/25/21 0354 02/26/21 0350  WBC 8.7 6.7  --  6.7 7.2  NEUTROABS 6.3  --   --   --   --   HGB 8.2* 6.5* 7.2* 7.8* 8.4*  HCT 28.3* 22.9* 24.6* 26.0* 27.4*  MCV 94.6 94.2  --  91.5 91.0  PLT 351 234  --  247 [id number]    Basic Metabolic Panel: Recent Labs  Lab 02/23/21 0127 02/24/21 0412  NA 136 138   K 4.4 4.3  CL 107 107  CO2 24 27  GLUCOSE 105* 103*  BUN 21 19  CREATININE 1.03 1.05  CALCIUM 8.2* 8.1*  MG  --  2.1    GFR: Estimated Creatinine Clearance: 70.3 mL/min (by C-G formula based on SCr of 1.05 mg/dL). Liver Function Tests: No results for input(s): AST, ALT, ALKPHOS, BILITOT, PROT, ALBUMIN in the last 168 hours. No results for input(s): LIPASE, AMYLASE in the last 168 hours. No results for input(s): AMMONIA in the last 168 hours. Coagulation Profile: No results for input(s): INR, PROTIME in the last 168 hours. Cardiac Enzymes: No  results for input(s): CKTOTAL, CKMB, CKMBINDEX, TROPONINI in the last 168 hours. BNP (last 3 results) No results for input(s): PROBNP in the last 8760 hours. HbA1C: No results for input(s): HGBA1C in the last 72 hours. CBG: Recent Labs  Lab 02/25/21 1128 02/25/21 1602 02/25/21 2014 02/26/21 0740 02/26/21 1233  GLUCAP 92 112* 118* 77 122*    Lipid Profile: No results for input(s): CHOL, HDL, LDLCALC, TRIG, CHOLHDL, LDLDIRECT in the last 72 hours. Thyroid Function Tests: No results for input(s): TSH, T4TOTAL, FREET4, T3FREE, THYROIDAB in the last 72 hours. Anemia Panel: No results for input(s): VITAMINB12, FOLATE, FERRITIN, TIBC, IRON, RETICCTPCT in the last 72 hours. Sepsis Labs: No results for input(s): PROCALCITON, LATICACIDVEN in the last 168 hours.  Recent Results (from the past 240 hour(s))  Resp Panel by RT-PCR (Flu A&B, Covid) Nasopharyngeal Swab     Status: None   Collection Time: 02/23/21  4:47 AM   Specimen: Nasopharyngeal Swab; Nasopharyngeal(NP) swabs in vial transport medium  Result Value Ref Range Status   SARS Coronavirus 2 by RT PCR NEGATIVE NEGATIVE Final    Comment: (NOTE) SARS-CoV-2 target nucleic acids are NOT DETECTED.  The SARS-CoV-2 RNA is generally detectable in upper respiratory specimens during the acute phase of infection. The lowest concentration of SARS-CoV-2 viral copies this assay can detect  is 138 copies/mL. A negative result does not preclude SARS-Cov-2 infection and should not be used as the sole basis for treatment or other patient management decisions. A negative result may occur with  improper specimen collection/handling, submission of specimen other than nasopharyngeal swab, presence of viral mutation(s) within the areas targeted by this assay, and inadequate number of viral copies(<138 copies/mL). A negative result must be combined with clinical observations, patient history, and epidemiological information. The expected result is Negative.  Fact Sheet for Patients:  EntrepreneurPulse.com.au  Fact Sheet for Healthcare Providers:  IncredibleEmployment.be  This test is no t yet approved or cleared by the [place] FDA and  has been authorized for detection and/or diagnosis of SARS-CoV-2 by FDA under an Emergency Use Authorization (EUA). This EUA will remain  in effect (meaning this test can be used) for the duration of the COVID-19 declaration under Section 564(b)(1) of the Act, 21 U.S.C.section 360bbb-3(b)(1), unless the authorization is terminated  or revoked sooner.       Influenza A by PCR NEGATIVE NEGATIVE Final   Influenza B by PCR NEGATIVE NEGATIVE Final    Comment: (NOTE) The Xpert Xpress SARS-CoV-2/FLU/RSV plus assay is intended as an aid in the diagnosis of influenza from Nasopharyngeal swab specimens and should not be used as a sole basis for treatment. Nasal washings and aspirates are unacceptable for Xpert Xpress SARS-CoV-2/FLU/RSV testing.  Fact Sheet for Patients: EntrepreneurPulse.com.au  Fact Sheet for Healthcare Providers: IncredibleEmployment.be  This test is not yet approved or cleared by the [place] FDA and has been authorized for detection and/or diagnosis of SARS-CoV-2 by FDA under an Emergency Use Authorization (EUA). This EUA will remain in effect  (meaning this test can be used) for the duration of the COVID-19 declaration under Section 564(b)(1) of the Act, 21 U.S.C. section 360bbb-3(b)(1), unless the authorization is terminated or revoked.  Performed at [facility], [place] [address] [place],  [address]        Radiology Studies: No results found.    Scheduled Meds:  sodium chloride   Intravenous Once   apixaban  5 mg Oral BID   atorvastatin  80 mg Oral QPC  supper   furosemide  40 mg Oral Daily   metoprolol tartrate  75 mg Oral BID   mometasone-formoterol  2 puff Inhalation BID   montelukast  10 mg Oral QHS   nicotine  21 mg Transdermal Daily   pantoprazole (PROTONIX) IV  40 mg Intravenous Q12H   venlafaxine XR  75 mg Oral BID   Continuous Infusions:   LOS: 3 days      Time spent: 20 minutes   Dessa Phi, DO [facility] 02/26/2021, 1:55 PM   Available via Epic secure chat 7am-7pm After these hours, please refer to coverage provider listed on [email or web address]

## 2021-02-26 NOTE — Plan of Care (Signed)

## 2021-02-26 NOTE — Evaluation (Signed)
Physical Therapy Evaluation Patient Details Name: Jordan [name] MRN: [id number] DOB: [birth year] Today's Date: 02/26/2021   History of Present Illness  Patient is 70 y.o. male presented to ED with Lt LE swelling, redness, and pain. Pt recently seen in ED on July 10th due to a fall over the weeken and was noted to have a right knee contusion with open wound left leg wound and was discharged home to continue with home medications. 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  Clinical Impression  Pt is a 70 y.o. male with above HPI. Up to MIN A required for bed mobility and sit to stand transfers. Pt ambulated ~17f with MIN guard-supervision, no LOB observed. Difficulty obtaining consistent O2 reading during session. Pt's O2 sat on portable finger pulse ox 94-96% on RA during ambulation, portable pulse ox machine connected to ear probe reading 79% from time pt in recliner throughout ambulation with "pulse search" bar reading intermittently at top, difficulty obtaining accurate reading. Pt states that he currently has access to CNA who comes on weekends and is arranging for increased assist to be provided during the week/nights upon d/c. Pt also has been participating in [facility] Recommend continued [facility] upon d/c. Pt will benefit from continued skilled PT to increase their independence and maximize safety with mobility.      Follow Up Recommendations Home health PT    Equipment Recommendations  None recommended by PT (pt owns RW)    Recommendations for Other Services       Precautions / Restrictions Precautions Precautions: Fall Restrictions Weight Bearing Restrictions: No      Mobility  Bed Mobility Overal bed mobility: Needs Assistance Bed  Mobility: Sit to Supine     Supine to sit: Min assist Sit to supine: Min assist   General bed mobility comments: Pt OOB in recliner upon entry. Pt with good initiation if B LEs onto bed, MIN A for sit to supine to complete progression of Rt LE into bed due to knee pain.    Transfers Overall transfer level: Needs assistance Equipment used: Rolling walker (2 wheeled) Transfers: Sit to/from Stand Sit to Stand: Min assist         General transfer comment: MIN A-min guard from recliner chair. Pt demonstrating safe hand placement with use of RW.  Ambulation/Gait Ambulation/Gait assistance: Min gGaffer(Feet): 50 Feet Assistive device: Rolling walker (2 wheeled) Gait Pattern/deviations: Step-through pattern;Decreased stride length;Trunk flexed Gait velocity: decr   General Gait Details: Pt with flexed trunk/kyphotic posture and decreased stride length and foot clearance, limited terminal knee extension noted on R>L during ambulation, no overt LOB observed. Pt with increased fatigue/SOB with ambulation ~526f Pt states he has been working on ambulation and activity tolerance with HHPT. Difficulty obtaining consistent O2 reading. Pt's O2 sat on portable finger pulse ox 94-96% on RA during ambulation, portable pulse ox machine connected to ear probe reading 79% from time pt in recliner throughout ambulation with "pulse search" bar reading intermittently at top, difficulty obtaining accurate reading. Pt reporting with some SOB with further ambulation distance reports he moves around home on RA, uses 2L at baseline.  Stairs            Wheelchair Mobility    Modified Rankin (Stroke Patients Only)  Balance Overall balance assessment: Needs assistance Sitting-balance support: No upper extremity supported Sitting balance-Leahy Scale: Good     Standing balance support: Bilateral upper extremity supported Standing balance-Leahy Scale: Poor Standing  balance comment: reliant on UE support                             Pertinent Vitals/Pain Pain Assessment: Faces Faces Pain Scale: Hurts little more Pain Location: Rt knee Pain Descriptors / Indicators: Discomfort;Sore Pain Intervention(s): Limited activity within patient's tolerance;Monitored during session;Repositioned    Home Living Family/patient expects to be discharged to:: Private residence Living Arrangements: Alone Available Help at Discharge: Family;Neighbor;Friend(s) Type of Home: House Home Access: Stairs to enter (2 steps side door, no railing) Entrance Stairs-Rails: Can reach both Entrance Stairs-Number of Steps: 8 Home Layout: One level Home Equipment: Shower seat - built in;Cane - single point;Walker - 2 wheels;Bedside commode;Grab bars - tub/shower;Grab bars - toilet Additional Comments: Pt reports he has CNA assist as needed. CNA on weekend and one during the week/night. He has been using CNA on weekends mostly, but plans to get help during week upon d/c.    Prior Function Level of Independence: Needs assistance   Gait / Transfers Assistance Needed: uses RW or cane for mobility  ADL's / Homemaking Assistance Needed: some assistance for LE bathing (due to leg wraps), Caregiver comes on sat or sun and helps with cooking ,cleaning, shopping  Comments: Pt reports he also has HHPT/OT 2x/wk each discipline.     Hand Dominance   Dominant Hand: Right    Extremity/Trunk Assessment   Upper Extremity Assessment Upper Extremity Assessment: Defer to OT evaluation RUE Deficits / Details: 2-/5 shoulder ROM (at baseline) otherwise functional ROM, grossly 4/5 strength RUE Coordination: WNL LUE Deficits / Details: 3-/5 shoulder ROM (at baseline), otherwise functional ROM, grossly 4/5 strength LUE Coordination: WNL    Lower Extremity Assessment Lower Extremity Assessment: Generalized weakness    Cervical / Trunk Assessment Cervical / Trunk Assessment:  Kyphotic  Communication   Communication: No difficulties  Cognition Arousal/Alertness: Awake/alert Behavior During Therapy: WFL for tasks assessed/performed Overall Cognitive Status: Within Functional Limits for tasks assessed                                        General Comments      Exercises     Assessment/Plan    PT Assessment Patient needs continued PT services  PT Problem List Decreased strength;Decreased range of motion;Decreased activity tolerance;Decreased balance;Decreased mobility       PT Treatment Interventions DME instruction;Gait training;Stair training;Functional mobility training;Therapeutic activities;Therapeutic exercise;Balance training;Patient/family education    PT Goals (Current goals can be found in the Care Plan section)  Acute Rehab PT Goals Patient Stated Goal: to get stronger PT Goal Formulation: With patient Time For Goal Achievement: 03/12/21 Potential to Achieve Goals: Good    Frequency Min 3X/week   Barriers to discharge        Co-evaluation               AM-PAC PT "6 Clicks" Mobility  Outcome Measure Help needed turning from your back to your side while in a flat bed without using bedrails?: None Help needed moving from lying on your back to sitting on the side of a flat bed without using bedrails?: A Little Help needed moving to and from a  bed to a chair (including a wheelchair)?: A Little Help needed standing up from a chair using your arms (e.g., wheelchair or bedside chair)?: A Little Help needed to walk in hospital room?: A Little Help needed climbing 3-5 steps with a railing? : A Lot 6 Click Score: 18    End of Session Equipment Utilized During Treatment: Gait belt Activity Tolerance: Patient tolerated treatment well;Patient limited by fatigue Patient left: in bed;with call bell/phone within reach Nurse Communication: Mobility status PT Visit Diagnosis: Unsteadiness on feet (R26.81);Muscle weakness  (generalized) (M62.81)    Time: [id number] PT Time Calculation (min) (ACUTE ONLY): 20 min   Charges:   PT Evaluation $PT Eval Low Complexity: 1 Low          Festus Barren PT, DPT  Acute Rehabilitation Services  Office [phone]  02/26/2021, 11:37 AM

## 2021-02-26 NOTE — Evaluation (Signed)
Occupational Therapy Evaluation Patient Details Name: Jordan [name] MRN: [id number] DOB: [birth year] Today's Date: 02/26/2021    History of Present Illness Patient is 70 y.o. male presented to ED with Lt LE swelling, redness, and pain. Pt recently seen in ED on July 10th due to a fall over the weeken and was noted to have a right knee contusion with open wound left leg wound and was discharged home to continue with home medications. 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   Clinical Impression   [name] is a 70 year old man who presents with impaired shoulder ROM, generalized weakness, decreased activity tolerance, impaired cardiopulmonary endurance currently on 2 L [place] and impaired balance. Patient overall needing min guard for ambulation and ADLs and min assist for LB ADLs. Patient able to ambulate in room with RW but o2 sats unable to be ascertained (unable to read sat on finger and multiple attempts to get ear probe to work). Appeared mildly short of breath at end of standing grooming task. Patient reports he has a caregiver that comes on one day of the weekend to assist him IADLs, and [place] therapy has been coming during the weak. He reports he is planning on getting another caregiver for during the week. Patient wants to go home at discharge. Would recommend continuing home health therapy and intermittent assistance.     Follow Up Recommendations  Home health OT    Equipment Recommendations  None recommended by OT    Recommendations for Other Services       Precautions / Restrictions Precautions Precautions: Fall Restrictions Weight Bearing Restrictions: No      Mobility Bed Mobility Overal bed mobility: Needs Assistance Bed  Mobility: Supine to Sit     Supine to sit: Min assist     General bed mobility comments: min assist to scoot forward    Transfers Overall transfer level: Needs assistance Equipment used: Rolling walker (2 wheeled) Transfers: Sit to/from Stand Sit to Stand: Min guard         General transfer comment: min guard and increased time to rise with RW. Otherwise min guard to ambulate in room.    Balance Overall balance assessment: Needs assistance Sitting-balance support: No upper extremity supported Sitting balance-Leahy Scale: Good     Standing balance support: Bilateral upper extremity supported   Standing balance comment: reliant on at least one upper extremity                           ADL either performed or assessed with clinical judgement   ADL Overall ADL's : Needs assistance/impaired Eating/Feeding: Independent   Grooming: Standing;Min guard Grooming Details (indicate cue type and reason): stood at sink for grooming Upper Body Bathing: Set up;Sitting   Lower Body Bathing: Minimal assistance;Sit to/from stand;Set up Lower Body Bathing Details (indicate cue type and reason): stood at sink to wash buttocks and perianal area with min guard Upper Body Dressing : Set up;Sitting   Lower Body Dressing: Minimal assistance;Sit to/from stand Lower Body Dressing Details (indicate cue type and reason): to don slippers Toilet Transfer: Min guard;Comfort height toilet;RW;Grab bars   Toileting- Water quality scientist and Hygiene: Min guard;Sit to/from stand;Sitting/lateral lean     Tub/Shower Transfer Details (  indicate cue type and reason): performs sponge baths at baseline Functional mobility during ADLs: Min guard;Rolling walker       Vision Patient Visual Report: No change from baseline       Perception     Praxis      Pertinent Vitals/Pain Pain Assessment: No/denies pain     Hand Dominance Right   Extremity/Trunk Assessment Upper Extremity  Assessment Upper Extremity Assessment: RUE deficits/detail;LUE deficits/detail RUE Deficits / Details: 2-/5 shoulder ROM (at baseline) otherwise functional ROM, grossly 4/5 strength RUE Coordination: WNL LUE Deficits / Details: 3-/5 shoulder ROM (at baseline), otherwise functional ROM, grossly 4/5 strength LUE Coordination: WNL   Lower Extremity Assessment Lower Extremity Assessment: Defer to PT evaluation   Cervical / Trunk Assessment Cervical / Trunk Assessment: Kyphotic   Communication Communication Communication: No difficulties   Cognition Arousal/Alertness: Awake/alert Behavior During Therapy: WFL for tasks assessed/performed Overall Cognitive Status: Within Functional Limits for tasks assessed                                     General Comments       Exercises     Shoulder Instructions      Home Living Family/patient expects to be discharged to:: Private residence Living Arrangements: Alone Available Help at Discharge: Family;Neighbor;Friend(s) Type of Home: House Home Access: Stairs to enter (8 steps in the front have rails on both side) Entrance Stairs-Number of Steps: 2 Entrance Stairs-Rails: None Home Layout: One level     Bathroom Shower/Tub: Occupational psychologist: Standard Bathroom Accessibility: Yes   Home Equipment: Shower seat - built in;Cane - single point;Walker - 2 wheels;Bedside commode;Grab bars - tub/shower;Grab bars - toilet          Prior Functioning/Environment Level of Independence: Needs assistance  Gait / Transfers Assistance Needed: uses RW or cane for mobility ADL's / Homemaking Assistance Needed: some assistance for LE bathing (due to leg wraps), Caregiver comes on sat or sun and helps with cooking ,cleaning, shopping            OT Problem List: Decreased strength;Decreased range of motion;Decreased activity tolerance;Impaired balance (sitting and/or standing);Impaired UE functional use      OT  Treatment/Interventions: Self-care/ADL training;Therapeutic exercise;DME and/or AE instruction;Therapeutic activities;Balance training;Patient/family education    OT Goals(Current goals can be found in the care plan section) Acute Rehab OT Goals Patient Stated Goal: to get stronger and go home OT Goal Formulation: With patient Time For Goal Achievement: 03/12/21 Potential to Achieve Goals: Good  OT Frequency: Min 2X/week   Barriers to D/C:            Co-evaluation              AM-PAC OT "6 Clicks" Daily Activity     Outcome Measure Help from another person eating meals?: None Help from another person taking care of personal grooming?: A Little Help from another person toileting, which includes using toliet, bedpan, or urinal?: A Little Help from another person bathing (including washing, rinsing, drying)?: A Little Help from another person to put on and taking off regular upper body clothing?: A Little Help from another person to put on and taking off regular lower body clothing?: A Little 6 Click Score: 19   End of Session Equipment Utilized During Treatment: Gait belt;Rolling walker;Oxygen Nurse Communication: Mobility status (o2 sat difficulties)  Activity Tolerance: Patient tolerated treatment well Patient left:  in chair;with call bell/phone within reach  OT Visit Diagnosis: Unsteadiness on feet (R26.81);Other abnormalities of gait and mobility (R26.89);Muscle weakness (generalized) (M62.81)                Time: [id number] OT Time Calculation (min): 34 min Charges:  OT General Charges $OT Visit: 1 Visit OT Evaluation $OT Eval Moderate Complexity: 1 Mod OT Treatments $Self Care/Home Management : 8-22 mins  Greenleigh Kauth, OTR/L [place] [phone] Pager: [place] 02/26/2021, 10:04 AM

## 2021-02-27 LAB — CBC
HCT: 25.9 % — ABNORMAL LOW (ref 39.0–52.0)
Hemoglobin: 8.1 g/dL — ABNORMAL LOW (ref 13.0–17.0)
MCH: 28.8 pg (ref 26.0–34.0)
MCHC: 31.3 g/dL (ref 30.0–36.0)
MCV: 92.2 fL (ref 80.0–100.0)
Platelets: 258 10*3/uL (ref 150–400)
RBC: 2.81 MIL/uL — ABNORMAL LOW (ref 4.22–5.81)
RDW: 17.8 % — ABNORMAL HIGH (ref 11.5–15.5)
WBC: 7.3 10*3/uL (ref 4.0–10.5)
nRBC: 0 % (ref 0.0–0.2)

## 2021-02-27 LAB — GLUCOSE, CAPILLARY
Glucose-Capillary: 61 mg/dL — ABNORMAL LOW (ref 70–99)
Glucose-Capillary: 74 mg/dL (ref 70–99)
Glucose-Capillary: 75 mg/dL (ref 70–99)
Glucose-Capillary: 99 mg/dL (ref 70–99)
Glucose-Capillary: 87 mg/dL (ref 70–99)

## 2021-02-27 MED ORDER — LORAZEPAM 1 MG PO TABS
1.0000 mg | ORAL_TABLET | Freq: Once | ORAL | Status: AC
  Administered 2021-02-27: 1 mg via ORAL
  Filled 2021-02-27: qty 1

## 2021-02-27 MED ORDER — METOPROLOL TARTRATE 75 MG PO TABS: 75.0000 mg | ORAL_TABLET | Freq: Two times a day (BID) | ORAL | 2 refills | Status: DC

## 2021-02-27 NOTE — TOC Progression Note (Signed)
Transition of Care [facility]) - Progression Note    Patient Details  Name: Jordan [name] MRN: [id number] Date of Birth: [birth year]  [facility] CM/SW Contact  Purcell Mouton, RN Phone Number: 02/27/2021, 10:38 AM  Clinical Narrative:     Spoke with pt who selected [facility] for HHPT. Referral given to in house rep.   Expected Discharge Plan: [place] Barriers to Discharge: No Barriers Identified  Expected Discharge Plan and Services Expected Discharge Plan: [place] arrangements for the past 2 months: Single Family Home Expected Discharge Date: 02/27/21                                     Social Determinants of Health (SDOH) Interventions    Readmission Risk Interventions No flowsheet data found.

## 2021-02-27 NOTE — Progress Notes (Signed)
Hypoglycemic Event  CBG: 61  Treatment: 4 oz juice/soda, breakfast  Symptoms: None  Follow-up CBG: Time:0805  CBG Result:75  Possible Reasons for Event: Inadequate meal intake  Comments/MD notified:Pt consumed breakfast tray.  Rechecked at 0837, CBG 99    Yohance Hathorne A Delfina Redwood

## 2021-02-27 NOTE — Plan of Care (Signed)
  Problem: Clinical Measurements: Goal: Respiratory complications will improve Outcome: Progressing Goal: Cardiovascular complication will be avoided Outcome: Progressing   Problem: Coping: Goal: Level of anxiety will decrease Outcome: Progressing   

## 2021-02-27 NOTE — Plan of Care (Signed)
Discharge education provided to patient.  Patient's IV and condom catheter removed.  Patient taken to main entrance via wheelchair, picked up for transport home by aunt.

## 2021-02-27 NOTE — Discharge Summary (Signed)
Physician Discharge Summary  Jordan [name] [id number] DOB: Mar 21, 1951 DOA: 02/23/2021  PCP: [name], MD  Admit date: 02/23/2021 Discharge date: 02/27/2021  Admitted From: Home Disposition:  Home with home health   Recommendations for Outpatient Follow-up:  Follow up with PCP in 1 week Repeat Hgb in 1 week   Discharge Condition: Stable CODE STATUS: Full  Diet recommendation: Heart healthy   Brief/Interim Summary: Jordan [name] is a 70 y.o. male with medical history significant for COPD, chronic hypoxic respiratory failure, coronary artery disease, AS s/p TAVR, chronic venous stasis, and atrial fibrillation on Eliquis, who presented to the ER with acute onset of shortness of breath and palpitations.  Patient was admitted to the hospital in July 2022 with cellulitis, developed new onset atrial fibrillation during that admission with rates as high as 130s, was started on Eliquis, and atenolol was changed to metoprolol at that time.  He had been doing fairly well back at home before developing shortness of breath and rapid palpitations prior to admission.     Upon arrival to the ED, patient is found to be afebrile, saturating mid to upper 90s on 3 L/min of supplemental oxygen, tachycardic to 130, and with blood pressure 92/68.  EKG features atrial fibrillation with RVR.   Chest x-ray with resolution of previously noted pulmonary edema, small right pleural effusion, and interval TAVR.  Chemistry panel is unremarkable and CBC notable for a stable chronic normocytic anemia.  Troponin was normal x2 and BNP elevated at 933.   Patient was treated with Ativan, 20 mg IV diltiazem injection, and started on diltiazem infusion in the ED.  Rate control was achieved but began sustaining RVR again once the diltiazem infusion was stopped.  Diltiazem infusion was resumed. His metoprolol was further uptitrated from his home dose and Cardizem drip was able to be weaned off.  Patient had a drop in hgb  and required blood transfusion. He had 1 BM which was reported non-bloody and non-melanotic. Hgb remained stable after Eliquis was resumed. HR remained stable on PO beta blocker.   Discharge Diagnoses:  Principal Problem:   Atrial fibrillation with RVR (HCC) Active Problems:   Chronic venous insufficiency   Diabetes mellitus without complication (HCC)   COPD (chronic obstructive pulmonary disease) (HCC)   CAD (coronary artery disease)   (HFpEF) heart failure with preserved ejection fraction (HCC)   Anxiety   Chronic respiratory failure with hypoxia (HCC)   A. fib RVR -Now off Cardizem drip -Continue Lopressor at increased dose -Resume Eliquis -Rate well controlled   Normocytic anemia -Baseline hemoglobin appears to be around 8 -Unclear if he has had any overt blood loss -Received 1 unit packed red blood cell 9/7 -Resume Eliquis -Hemoglobin has remained stable for several days -Had 1 BM this morning, reportedly nonbloody    Chronic diastolic heart failure -Continue Lasix, Lopressor  COPD with chronic hypoxemic respiratory failure -Stable, continue Dulera, DuoNeb as needed, nicotine patch -Uses 2 L nasal cannula O2 at baseline  CAD -Continue lipitor, Lopressor   Generalized weakness -PT OT recommending home health PT OT   Discharge Instructions  Discharge Instructions     Call MD for:   Complete by: As directed    Bloody or dark/black stool   Call MD for:  difficulty breathing, headache or visual disturbances   Complete by: As directed    Call MD for:  extreme fatigue   Complete by: As directed    Call MD for:  persistant dizziness or light-headedness  Complete by: As directed    Call MD for:  persistant nausea and vomiting   Complete by: As directed    Call MD for:  severe uncontrolled pain   Complete by: As directed    Call MD for:  temperature >100.4   Complete by: As directed    Discharge instructions   Complete by: As directed    You were cared for  by a hospitalist during your hospital stay. If you have any questions about your discharge medications or the care you received while you were in the hospital after you are discharged, you can call the unit and ask to speak with the hospitalist on call if the hospitalist that took care of you is not available. Once you are discharged, your primary care physician will handle any further medical issues. Please note that NO REFILLS for any discharge medications will be authorized once you are discharged, as it is imperative that you return to your primary care physician (or establish a relationship with a primary care physician if you do not have one) for your aftercare needs so that they can reassess your need for medications and monitor your lab values.   Increase activity slowly   Complete by: As directed    No wound care   Complete by: As directed       Allergies as of 02/27/2021       Reactions   Altace [ramipril] Other (See Comments)   Dizziness, migraine, weakness- "there is dye in it"   Codeine Itching   Naproxen Itching        Medication List     STOP taking these medications    aspirin 81 MG tablet   hydrALAZINE 50 MG tablet Commonly known as: APRESOLINE       TAKE these medications    apixaban 5 MG Tabs tablet Commonly known as: ELIQUIS Take 1 tablet (5 mg total) by mouth 2 (two) times daily.   atorvastatin 80 MG tablet Commonly known as: LIPITOR Take 80 mg by mouth daily after supper.   bismuth subsalicylate [id number] suspension Commonly known as: PEPTO BISMOL Take 30 mLs by mouth every 6 (six) hours as needed for indigestion.   budesonide-formoterol 160-4.5 MCG/ACT inhaler Commonly known as: Symbicort Inhale 2 puffs into the lungs in the morning and at bedtime.   butalbital-acetaminophen-caffeine 50-325-40 MG tablet Commonly known as: FIORICET Take 1 tablet by mouth 2 (two) times daily as needed for migraine.   carboxymethylcellulose 0.5 %  Soln Commonly known as: REFRESH PLUS Place 1 drop into both eyes daily as needed (dry eyes).   folic acid 1 MG tablet Commonly known as: FOLVITE Take 1 tablet (1 mg total) by mouth daily.   furosemide 40 MG tablet Commonly known as: LASIX Take 1 tablet (40 mg total) by mouth daily.   ipratropium-albuterol 0.5-2.5 (3) MG/3ML Soln Commonly known as: DUONEB Take 3 mLs by nebulization every 6 (six) hours as needed (shortness of breath/wheezing).   LORazepam 1 MG tablet Commonly known as: ATIVAN Take 1 mg by mouth 3 (three) times daily.   methocarbamol 750 MG tablet Commonly known as: ROBAXIN Take 750 mg by mouth as needed for muscle spasms.   Metoprolol Tartrate 75 MG Tabs Take 75 mg by mouth 2 (two) times daily. What changed:  medication strength how much to take   montelukast 10 MG tablet Commonly known as: SINGULAIR Take 10 mg by mouth at bedtime.   multivitamin with minerals Tabs tablet Take 1 tablet  by mouth daily.   nystatin cream Commonly known as: MYCOSTATIN Apply 1 application topically 2 (two) times daily. What changed:  when to take this reasons to take this   pantoprazole 40 MG tablet Commonly known as: PROTONIX Take 1 tablet (40 mg total) by mouth daily.   promethazine 25 MG tablet Commonly known as: PHENERGAN Take 25 mg by mouth every 8 (eight) hours as needed for nausea/vomiting.   venlafaxine XR 75 MG 24 hr capsule Commonly known as: EFFEXOR-XR Take 75 mg by mouth in the morning and at bedtime. MUST HAVE BRAND NAME ONLY        Follow-up Information     [name], MD. Schedule an appointment as soon as possible for a visit in 1 week(s).   Specialty: Endocrinology Contact information: [place] [address] [phone]         Jerline Pain, MD .   Specialty: Cardiology Contact information: [phone] [address] [place] [address] [phone]         Sherren Mocha, MD .   Specialty:  Cardiology Contact information: [phone] [place] [address] [phone]                Allergies  Allergen Reactions   Altace [Ramipril] Other (See Comments)    Dizziness, migraine, weakness- "there is dye in it"   Codeine Itching   Naproxen Itching    Consultations: None    Procedures/Studies: DG Chest 2 View  Result Date: 02/23/2021 CLINICAL DATA:  Dyspnea, atrial fibrillation, COPD. EXAM: CHEST - 2 VIEW COMPARISON:  01/02/2021 FINDINGS: Lungs are clear. No pneumothorax. Small right pleural effusion. Resolution of previously noted pulmonary edema. Transcatheter aortic valve replacement has been performed in the interval. Cardiac size is within normal limits. Osseous structures are age-appropriate. IMPRESSION: Interval transcatheter aortic valve replacement. Resolution of previously noted pulmonary edema. Small right pleural effusion. Electronically Signed   By: Fidela Salisbury M.D.   On: 02/23/2021 01:51       Discharge Exam: Vitals:   02/27/21 0700 02/27/21 0808  BP: 119/75   Pulse: 64   Resp: 18   Temp: 98 F (36.7 C)   SpO2: 100% 98%    General: Pt is alert, awake, not in acute distress Cardiovascular: Irreg rhythm rate 80s, S1/S2 +, no edema Respiratory: CTA bilaterally, no wheezing, no rhonchi, no respiratory distress, no conversational dyspnea  Abdominal: Soft, NT, ND, bowel sounds + Extremities: no edema, no cyanosis Psych: Normal mood and affect, stable judgement and insight     The results of significant diagnostics from this hospitalization (including imaging, microbiology, ancillary and laboratory) are listed below for reference.     Microbiology: Recent Results (from the past 240 hour(s))  Resp Panel by RT-PCR (Flu A&B, Covid) Nasopharyngeal Swab     Status: None   Collection Time: 02/23/21  4:47 AM   Specimen: Nasopharyngeal Swab; Nasopharyngeal(NP) swabs in vial transport medium  Result Value Ref Range Status    SARS Coronavirus 2 by RT PCR NEGATIVE NEGATIVE Final    Comment: (NOTE) SARS-CoV-2 target nucleic acids are NOT DETECTED.  The SARS-CoV-2 RNA is generally detectable in upper respiratory specimens during the acute phase of infection. The lowest concentration of SARS-CoV-2 viral copies this assay can detect is 138 copies/mL. A negative result does not preclude SARS-Cov-2 infection and should not be used as the sole basis for treatment or other patient management decisions. A negative result may occur with  improper specimen collection/handling, submission of specimen other than nasopharyngeal swab, presence of viral mutation(s) within the areas targeted by this assay, and inadequate number of viral copies(<138 copies/mL). A negative result must be combined with clinical observations, patient history, and epidemiological information. The expected result is Negative.  Fact Sheet for Patients:  EntrepreneurPulse.com.au  Fact Sheet for Healthcare Providers:  IncredibleEmployment.be  This test is no t yet approved or cleared by the [place] FDA and  has been authorized for detection and/or diagnosis of SARS-CoV-2 by FDA under an Emergency Use Authorization (EUA). This EUA will remain  in effect (meaning this test can be used) for the duration of the COVID-19 declaration under Section 564(b)(1) of the Act, 21 U.S.C.section 360bbb-3(b)(1), unless the authorization is terminated  or revoked sooner.       Influenza A by PCR NEGATIVE NEGATIVE Final   Influenza B by PCR NEGATIVE NEGATIVE Final    Comment: (NOTE) The Xpert Xpress SARS-CoV-2/FLU/RSV plus assay is intended as an aid in the diagnosis of influenza from Nasopharyngeal swab specimens and should not be used as a sole basis for treatment. Nasal washings and aspirates are unacceptable for Xpert Xpress SARS-CoV-2/FLU/RSV testing.  Fact Sheet for  Patients: EntrepreneurPulse.com.au  Fact Sheet for Healthcare Providers: IncredibleEmployment.be  This test is not yet approved or cleared by the [place] FDA and has been authorized for detection and/or diagnosis of SARS-CoV-2 by FDA under an Emergency Use Authorization (EUA). This EUA will remain in effect (meaning this test can be used) for the duration of the COVID-19 declaration under Section 564(b)(1) of the Act, 21 U.S.C. section 360bbb-3(b)(1), unless the authorization is terminated or revoked.  Performed at [facility], [place] [address], [place], [place] [address]      Labs: BNP (last 3 results) Recent Labs    03/03/20 1600 12/30/20 0412 02/23/21 0127  BNP 2,454.0* 805.0* 99991111*   Basic Metabolic Panel: Recent Labs  Lab 02/23/21 0127 02/24/21 0412  NA 136 138  K 4.4 4.3  CL 107 107  CO2 24 27  GLUCOSE 105* 103*  BUN 21 19  CREATININE 1.03 1.05  CALCIUM 8.2* 8.1*  MG  --  2.1   Liver Function Tests: No results for input(s): AST, ALT, ALKPHOS, BILITOT, PROT, ALBUMIN in the last 168 hours. No results for input(s): LIPASE, AMYLASE in the last 168 hours. No results for input(s): AMMONIA in the last 168 hours. CBC: Recent Labs  Lab 02/23/21 0127 02/24/21 0412 02/24/21 1120 02/25/21 0354 02/26/21 0350 02/27/21 0412  WBC 8.7 6.7  --  6.7 7.2 7.3  NEUTROABS 6.3  --   --   --   --   --   HGB 8.2* 6.5* 7.2* 7.8* 8.4* 8.1*  HCT 28.3* 22.9* 24.6* 26.0* 27.4* 25.9*  MCV 94.6 94.2  --  91.5 91.0 92.2  PLT 351 234  --  247 260 258   Cardiac Enzymes: No results for input(s): CKTOTAL, CKMB, CKMBINDEX, TROPONINI in the last 168 hours. BNP: Invalid input(s): POCBNP CBG: Recent Labs  Lab 02/26/21 2056 02/27/21 0736 02/27/21 0803 02/27/21 0805 02/27/21 0838  GLUCAP 100* 61* 74 75 99   D-Dimer No results for input(s): DDIMER in the last 72 hours. Hgb A1c No results for input(s): HGBA1C in the  last 72 hours. Lipid Profile No results for input(s): CHOL, HDL, LDLCALC, TRIG, CHOLHDL, LDLDIRECT in the last 72 hours. Thyroid function studies No results for input(s): TSH, T4TOTAL, T3FREE, THYROIDAB in the last 72 hours.  Invalid input(s): FREET3 Anemia work up No results for input(s): VITAMINB12, FOLATE, FERRITIN, TIBC, IRON, RETICCTPCT in the last 72 hours. Urinalysis    Component Value Date/Time   COLORURINE YELLOW 12/29/2020 2022   APPEARANCEUR CLEAR 12/29/2020 2022   LABSPEC 1.006 12/29/2020 2022   PHURINE 5.0 12/29/2020 2022   GLUCOSEU NEGATIVE 12/29/2020 2022   HGBUR NEGATIVE 12/29/2020 2022   BILIRUBINUR NEGATIVE 12/29/2020 2022   KETONESUR NEGATIVE 12/29/2020 2022   PROTEINUR NEGATIVE 12/29/2020 2022   NITRITE NEGATIVE 12/29/2020 2022   LEUKOCYTESUR NEGATIVE 12/29/2020 2022   Sepsis Labs Invalid input(s): PROCALCITONIN,  WBC,  LACTICIDVEN Microbiology Recent Results (from the past 240 hour(s))  Resp Panel by RT-PCR (Flu A&B, Covid) Nasopharyngeal Swab     Status: None   Collection Time: 02/23/21  4:47 AM   Specimen: Nasopharyngeal Swab; Nasopharyngeal(NP) swabs in vial transport medium  Result Value Ref Range Status   SARS Coronavirus 2 by RT PCR NEGATIVE NEGATIVE Final    Comment: (NOTE) SARS-CoV-2 target nucleic acids are NOT DETECTED.  The SARS-CoV-2 RNA is generally detectable in upper respiratory specimens during the acute phase of infection. The lowest concentration of SARS-CoV-2 viral copies this assay can detect is 138 copies/mL. A negative result does not preclude SARS-Cov-2 infection and should not be used as the sole basis for treatment or other patient management decisions. A negative result may occur with  improper specimen collection/handling, submission of specimen other than nasopharyngeal swab, presence of viral mutation(s) within the areas targeted by this assay, and inadequate number of viral copies(<138 copies/mL). A negative result must  be combined with clinical observations, patient history, and epidemiological information. The expected result is Negative.  Fact Sheet for Patients:  EntrepreneurPulse.com.au  Fact Sheet for Healthcare Providers:  IncredibleEmployment.be  This test is no t yet approved or cleared by the [place] FDA and  has been authorized for detection and/or diagnosis of SARS-CoV-2 by FDA under an Emergency Use Authorization (EUA). This EUA will remain  in effect (meaning this test can be used) for the duration of the COVID-19 declaration under Section 564(b)(1) of the Act, 21 U.S.C.section 360bbb-3(b)(1), unless the authorization is terminated  or revoked sooner.       Influenza A by PCR NEGATIVE NEGATIVE Final   Influenza B by PCR NEGATIVE NEGATIVE Final    Comment: (NOTE) The Xpert Xpress SARS-CoV-2/FLU/RSV plus assay is intended as an aid in the diagnosis of influenza from Nasopharyngeal swab specimens and should not be used as a sole basis for treatment. Nasal washings and aspirates are unacceptable for Xpert Xpress SARS-CoV-2/FLU/RSV testing.  Fact Sheet for Patients: EntrepreneurPulse.com.au  Fact Sheet for Healthcare Providers: IncredibleEmployment.be  This test is not yet approved or cleared by the [place] FDA and has been authorized for detection and/or diagnosis of SARS-CoV-2 by FDA under an Emergency Use Authorization (EUA). This EUA will remain in effect (meaning this test can be used) for the duration of the COVID-19 declaration under Section 564(b)(1) of the Act, 21 U.S.C. section 360bbb-3(b)(1), unless the authorization is terminated or revoked.  Performed at [facility], [place] [address] [place], [place] [address]      Patient was seen and examined on the day of discharge and was found to be in stable condition. Time coordinating discharge: 25 minutes including  assessment and coordination of care, as well as examination of the patient.   SIGNED:  Dessa Phi, DO [facility] 02/27/2021, 10:14 AM

## 2021-03-16 ENCOUNTER — Ambulatory Visit: Payer: Medicare PPO | Admitting: Physician Assistant

## 2021-03-16 ENCOUNTER — Other Ambulatory Visit: Payer: Self-pay

## 2021-03-16 ENCOUNTER — Encounter: Payer: Self-pay | Admitting: Physician Assistant

## 2021-03-16 VITALS — BP 90/42 | HR 62 | Ht 72.0 in | Wt 158.6 lb

## 2021-03-16 DIAGNOSIS — I4819 Other persistent atrial fibrillation: Secondary | ICD-10-CM

## 2021-03-16 DIAGNOSIS — E782 Mixed hyperlipidemia: Secondary | ICD-10-CM

## 2021-03-16 DIAGNOSIS — Z952 Presence of prosthetic heart valve: Secondary | ICD-10-CM

## 2021-03-16 DIAGNOSIS — I5032 Chronic diastolic (congestive) heart failure: Secondary | ICD-10-CM | POA: Diagnosis not present

## 2021-03-16 DIAGNOSIS — N183 Chronic kidney disease, stage 3 unspecified: Secondary | ICD-10-CM | POA: Diagnosis not present

## 2021-03-16 DIAGNOSIS — D649 Anemia, unspecified: Secondary | ICD-10-CM

## 2021-03-16 DIAGNOSIS — I10 Essential (primary) hypertension: Secondary | ICD-10-CM

## 2021-03-16 DIAGNOSIS — I2510 Atherosclerotic heart disease of native coronary artery without angina pectoris: Secondary | ICD-10-CM

## 2021-03-16 MED ORDER — FUROSEMIDE 40 MG PO TABS: ORAL_TABLET | ORAL | 3 refills | Status: DC

## 2021-03-16 MED ORDER — APIXABAN 5 MG PO TABS: 5.0000 mg | ORAL_TABLET | Freq: Two times a day (BID) | ORAL | 0 refills | Status: DC

## 2021-03-16 NOTE — Progress Notes (Addendum)
Cardiology Office Note:    Date:  03/16/2021   ID:  Jordan [name], DOB [birth year], MRN [id number]  PCP:  [name], MD   [facility] Providers Cardiologist:  Candee Furbish, MD Structural Heart:  Sherren Mocha, MD    Referring MD: [name], MD   Chief Complaint:  Hospitalization Follow-up (CHF, AFib)    Patient Profile:   Jordan [name] is a 70 y.o. male with:  Chronic diastolic CHF Echo 2/69: EF 55-60 Coronary artery disease Moderate nonobstructive by cath 9/21 Aortic valve disease Severe aortic insufficiency, moderate to severe aortic stenosis S/p TAVR in 1/22 Echo 2/22: EF 60-65, mild to moderate paravalvular leak Persistent Atrial fibrillation  COPD Hypertension Hyperlipidemia Diabetes mellitus Tobacco abuse Aortic atherosclerosis  OSA GERD Venous insufficiency Chronic normocytic anemia   Prior CV studies: Echocardiogram 07/23/20 EF 60-65, no RWMA, mild LVH, GRII DD, normal RVSF, moderate BAE, trivial MR, s/p TAVR with mild to moderate paravalvular leak, mean AV gradient 14.6  Carotid US 03/07/2020 Minimal bilateral plaque without significant ICA stenosis   Cardiac catheterization 03/06/2020 LAD moderate calcified LCx proximal 50 RCA mid 30, distal 30 Mean aortic valve gradient 26 mmHg, calculated AVA 1.6 cm Moderate pulmonary hypertension, mean PA pressure 34 mmHg    History of Present Illness: 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    He underwent TAVR in 1/22. He was last seen by Angelena Form, PA-C in the structural heart clinic in 2/22.  His echocardiogram showed mild to mod PVL and f/u echocardiogram is planned in 07/2021.    He was admitted in July 2022 with left leg cellulitis complicated by decompensated heart failure and atrial fibrillation with rapid ventricular rate.  He was evaluated by  cardiology.  He was placed on Apixaban for anticoagulation and rate control therapy was adjusted.  He was admitted again 9/6-9/10 with decompensated heart failure complicated by atrial fibrillation with rapid rate.  Cardiology was not involved in his care during this admission.  His rate control was adjusted.  He was diuresed.  He has chronic anemia and had worsening hemoglobin.  He did receive 1 unit PRBCs.  Apixaban was resumed prior to discharge with stable hemoglobin.  He returns for follow-up.  He was originally discharged to skilled nursing facility [place].  He is now back home.  He has been working with home health nursing.  He has also had Unna boots applied to his bilateral lower extremities to control his lower extremity edema.  His edema is improved.  His breathing is overall improved.  He has not had chest pain, syncope.  He has not had dizziness or near syncope.  He has not had orthopnea or PND.  His blood pressures have been running low at home.        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 Burt Knack and Dr. Cyndia Bent   Severe aortic stenosis    Severe aortic insufficiency, moderate to severe aortic stenosis   Sleep apnea    CPAP    Venous insufficiency    managed by ortho (Dr. Sharol Given)   Current Medications: Current Meds  Medication Sig   atorvastatin (LIPITOR) 80 MG tablet Take 80 mg by mouth daily after supper.    bismuth subsalicylate (PEPTO BISMOL) 262 MG/15ML suspension Take 30 mLs by  mouth every 6 (six) hours as needed for indigestion.   budesonide-formoterol (SYMBICORT) 160-4.5 MCG/ACT inhaler Inhale 2 puffs into the lungs in the morning and at bedtime.   butalbital-acetaminophen-caffeine (FIORICET, ESGIC) 50-325-40 MG per tablet Take 1 tablet by mouth 2 (two) times daily as needed for migraine.    carboxymethylcellulose (REFRESH PLUS) 0.5 % SOLN Place 1 drop into both eyes daily as needed (dry eyes).   folic acid (FOLVITE) 1 MG tablet Take 1 tablet (1 mg total) by mouth daily.   furosemide (LASIX) 40 MG tablet Take 1 tablet by mouth every other day alternating with 1/2 tablet every other day   ipratropium-albuterol (DUONEB) 0.5-2.5 (3) MG/3ML SOLN Take 3 mLs by nebulization every 6 (six) hours as needed (shortness of breath/wheezing).   LORazepam (ATIVAN) 1 MG tablet Take 1 mg by mouth 3 (three) times daily.   methocarbamol (ROBAXIN) 750 MG tablet Take 750 mg by mouth as needed for muscle spasms.   metoprolol tartrate 75 MG TABS Take 75 mg by mouth 2 (two) times daily.   montelukast (SINGULAIR) 10 MG tablet Take 10 mg by mouth as needed (for allergies).   Multiple Vitamin (MULTIVITAMIN WITH MINERALS) TABS Take 1 tablet by mouth daily.   nystatin cream (MYCOSTATIN) Apply 1 application topically 2 (two) times daily.   pantoprazole (PROTONIX) 40 MG tablet Take 1 tablet (40 mg total) by mouth daily.   promethazine (PHENERGAN) 25 MG tablet Take 25 mg by mouth every 8 (eight) hours as needed for nausea/vomiting.   venlafaxine XR (EFFEXOR-XR) 75 MG 24 hr capsule Take 75 mg by mouth in the morning and at bedtime. MUST HAVE BRAND NAME ONLY   [DISCONTINUED] apixaban (ELIQUIS) 5 MG TABS tablet Take 1 tablet (5 mg total) by mouth 2 (two) times daily.   [DISCONTINUED] furosemide (LASIX) 40 MG tablet Take 1 tablet (40 mg total) by mouth daily.    Allergies:   Altace [ramipril], Codeine, and Naproxen   Social History   Tobacco Use   Smoking status: Some Days    Packs/day: 0.75     Years: 55.00    Pack years: 41.25    Types: Cigarettes   Smokeless tobacco: Never  Vaping Use   Vaping Use: Never used  Substance Use Topics   Alcohol use: No    Alcohol/week: 0.0 standard drinks   Drug use: No    Family Hx: The patient's family history includes Alzheimer's disease in his mother; Congestive Heart Failure in his father; Diabetes in an other family member; Heart attack in his father; Hypertension in his mother; Stroke in his father. There is no history of Colon cancer, Rectal cancer, or Esophageal cancer.  Review of Systems  Gastrointestinal:  Negative for hematochezia.  Genitourinary:  Negative for hematuria.    EKGs/Labs/Other Test Reviewed:    EKG:  EKG is   ordered today.  The ekg ordered today demonstrates atrial fibrillation, HR 83, normal axis, nonspecific ST-T wave changes,  QTC 437  Recent Labs: 12/31/2020: ALT 17 02/23/2021: B Natriuretic Peptide 932.6 02/24/2021: BUN 19; Creatinine, Ser 1.05; Magnesium 2.1; Potassium 4.3; Sodium 138 02/27/2021: Hemoglobin 8.1; Platelets 258   Recent Lipid Panel Lab Results  Component Value Date/Time   CHOL 108 03/06/2020 05:08 AM   TRIG 68 03/06/2020 05:08 AM   HDL 30 (L) 03/06/2020 05:08 AM   LDLCALC 64 03/06/2020 05:08 AM     Risk Assessment/Calculations:    CHA2DS2-VASc Score = 5   This indicates a 7.2% annual risk of stroke. The patient's score is based upon: CHF History: 1 HTN History: 1 Diabetes History: 1 Stroke History: 0 Vascular Disease History: 1 Age Score: 1 Gender Score: 0        Physical Exam:    VS:  BP (!) 90/42   Pulse 62   Ht 6' (1.829 m)   Wt 158 lb 9.6 oz (71.9 kg)   BMI 21.51 kg/m     Wt Readings from Last 3 Encounters:  03/16/21 158 lb 9.6 oz (71.9 kg)  02/24/21 167 lb 5.3 oz (75.9 kg)  12/29/20 225 lb (102.1 kg)    Constitutional:      Appearance: Not in distress. Chronically ill-appearing.  Neck:     Vascular: No JVR.  Pulmonary:     Breath sounds: No wheezing. No  rales.  Cardiovascular:     Normal rate. Irregularly irregular rhythm. Normal S1. Normal S2.      Murmurs: There is no murmur.  Edema:    Peripheral edema absent.     Comments: Unna boots bilat LE Abdominal:     Palpations: Abdomen is soft.  Skin:    General: Skin is warm and dry.  Neurological:     General: No focal deficit present.     Mental Status: Alert and oriented to person, place and time.       ASSESSMENT & PLAN:   1. Persistent atrial fibrillation ([place]) He remains in atrial fibrillation.  His heart rate is well controlled at this time.  He seems to be tolerating anticoagulation.  Continue apixaban 5 mg twice daily.  Continue metoprolol tartrate 75 mg twice daily.  We could consider proceeding with cardioversion to try to restore normal sinus rhythm.  However, given his history of anemia and recent need for blood transfusion, I hesitate to proceed at this time.  I think it would be better to wait to see how stable his anemia remains.  If he remains stable without having to interrupt anticoagulation again, we can certainly consider this.  Follow-up in 6 weeks.  2. Chronic heart failure with preserved ejection fraction ([place]) Echo 2/22 with EF 60-65.  He has been admitted twice with decompensated heart failure.  Overall, his volume is stable.  He has lost a significant amount of weight since his initial admission to the hospital.  His blood pressure is running somewhat low.  Repeat by me is 102/52.  Overall, he is asymptomatic with his low blood pressure.  In any event, I will decrease his furosemide to 40 mg every other day alternating with 20 mg every other day.  He had labs recently with primary care which included a BMET.  I will request those for our records today.  He knows to weigh himself daily and to call if his weight increases by 3 pounds or more in 1 day.  3. S/P TAVR (transcatheter aortic valve replacement) He is now off of aspirin and Plavix.  He was taken off of  Plavix  when he was placed on Apixaban.  His aspirin was stopped when he developed worsening anemia in the hospital.  It is greater than 6 months since his TAVR.  I think it should be okay for him to remain on Apixaban alone.  I will review further with Dr. Burt Knack.  He had mild to moderate paravalvular leak by last echocardiogram.  He has follow-up pending in February 2023 with structural heart.  He will have a repeat echocardiogram at that time.  Continue SBE prophylaxis. Addendum: I reviewed with Dr. Burt Knack.  It is ok to remain off of ASA and stay on Eliquis only.  4. Stage 3 chronic kidney disease, unspecified whether stage 3a or 3b CKD ([place]) As noted, repeat BMET will be obtained from primary care.  5. Coronary artery disease involving native coronary artery of native heart without angina pectoris Nonobstructive disease by cardiac catheterization in 9/21.  He is not having anginal symptoms.  Continue atorvastatin 80 mg daily.  6. Essential hypertension As noted, blood pressure is low.  I will reduce his furosemide as outlined above.  7. Mixed hyperlipidemia Continue atorvastatin 80 mg daily.  LDL in 9/21 was 64.  8. Normocytic anemia He has chronic anemia.  He required blood transfusion in the hospital.  He had a follow-up CBC with primary care recently.  I will request those labs for our records.  As noted above, if his anemia remained stable over time while on Apixaban, we can certainly consider cardioversion to restore normal sinus rhythm. Addendum: Labs from PCP received and personally reviewed.  03/12/2021: Hgb 9.7, MCV 88.9, platelet count 228K, creatinine 1.0, K+ 4.5   Dispo:  Return in about 6 weeks (around 04/27/2021) for Close Follow Up w/ Dr. Marlou Porch, or [name], PA-C.   Medication Adjustments/Labs and Tests Ordered: Current medicines are reviewed at length with the patient today.  Concerns regarding medicines are outlined above.  Tests Ordered: Orders Placed This Encounter   Procedures   EKG 12-Lead    Medication Changes: Meds ordered this encounter  Medications   furosemide (LASIX) 40 MG tablet    Sig: Take 1 tablet by mouth every other day alternating with 1/2 tablet every other day    Dispense:  90 tablet    Refill:  3   apixaban (ELIQUIS) 5 MG TABS tablet    Sig: Take 1 tablet (5 mg total) by mouth 2 (two) times daily.    Dispense:  180 tablet    Refill:  0    Signed, [name], PA-C  03/16/2021 2:18 PM    [facility] [address], [place], [place]  [address] Phone: [phone]; Fax: [phone]

## 2021-03-16 NOTE — Patient Instructions (Signed)
Medication Instructions:  Your physician has recommended you make the following change in your medication:   DECREASE the Lasix to 40 mg taking 1 every other day alternating with 20 mg every other day   *If you need a refill on your cardiac medications before your next appointment, please call your pharmacy*   Lab Work: None ordered  If you have labs (blood work) drawn today and your tests are completely normal, you will receive your results only by: [place] (if you have MyChart) OR A paper copy in the mail If you have any lab test that is abnormal or we need to change your treatment, we will call you to review the results.   Testing/Procedures: None ordered   Follow-Up: At [facility], you and your health needs are our priority.  As part of our continuing mission to provide you with exceptional heart care, we have created designated Provider Care Teams.  These Care Teams include your primary Cardiologist (physician) and Advanced Practice Providers (APPs -  Physician Assistants and Nurse Practitioners) who all work together to provide you with the care you need, when you need it.  We recommend signing up for the patient portal called "MyChart".  Sign up information is provided on this After Visit Summary.  MyChart is used to connect with patients for Virtual Visits (Telemedicine).  Patients are able to view lab/test results, encounter notes, upcoming appointments, etc.  Non-urgent messages can be sent to your provider as well.   To learn more about what you can do with MyChart, go to [email or web address].    Your next appointment:   6 WEEKS 04/30/21 ARRIVE AT 2:00   The format for your next appointment:   In Person  Provider:   Candee Furbish, MD or [name], PA-C   Other Instructions Call us if your weight increases more than 3 lbs in 24 hours

## 2021-04-06 ENCOUNTER — Other Ambulatory Visit: Payer: Self-pay | Admitting: Physician Assistant

## 2021-04-06 DIAGNOSIS — Z952 Presence of prosthetic heart valve: Secondary | ICD-10-CM

## 2021-04-19 ENCOUNTER — Ambulatory Visit: Payer: Medicare PPO | Admitting: Orthopedic Surgery

## 2021-04-19 ENCOUNTER — Other Ambulatory Visit: Payer: Self-pay

## 2021-04-19 DIAGNOSIS — M1711 Unilateral primary osteoarthritis, right knee: Secondary | ICD-10-CM | POA: Diagnosis not present

## 2021-04-19 DIAGNOSIS — I872 Venous insufficiency (chronic) (peripheral): Secondary | ICD-10-CM

## 2021-04-20 ENCOUNTER — Encounter: Payer: Self-pay | Admitting: Orthopedic Surgery

## 2021-04-20 DIAGNOSIS — M1711 Unilateral primary osteoarthritis, right knee: Secondary | ICD-10-CM

## 2021-04-20 MED ORDER — METHYLPREDNISOLONE ACETATE 40 MG/ML IJ SUSP
40.0000 mg | INTRAMUSCULAR | Status: AC | PRN
  Administered 2021-04-20: 40 mg via INTRA_ARTICULAR

## 2021-04-20 MED ORDER — LIDOCAINE HCL (PF) 1 % IJ SOLN
5.0000 mL | INTRAMUSCULAR | Status: AC | PRN
  Administered 2021-04-20: 5 mL

## 2021-04-20 NOTE — Progress Notes (Signed)
Office Visit Note   Patient: Jordan [name]           Date of Birth: 1950-10-13           MRN: [id number] Visit Date: 04/19/2021              Requested by: [name], MD [address] [place],   [id number] PCP: [name], MD  Chief Complaint  Patient presents with   Right Knee - Follow-up    S/p cortisone injection 10/22/20      HPI: Patient is a 70 year old gentleman who presents with persistent arthritic pain right knee.  Patient was last injected about 6 months ago.  He complains of swelling and global pain.  He is currently wearing compression for his venous insufficiency.  Assessment & Plan: Visit Diagnoses:  1. Venous stasis dermatitis of both lower extremities   2. Arthritis of right knee     Plan: The right knee was injected he tolerated this well patient states he may consider total knee arthroplasty.  He mention Dr. Maureen Ralphs.  Follow-Up Instructions: Return if symptoms worsen or fail to improve.   Ortho Exam  Patient is alert, oriented, no adenopathy, well-dressed, normal affect, normal respiratory effort. Examination patient ambulates with a walker he has bad valgus deformity of both knees right and left.  His right knee is tender to palpation of the patellofemoral joint as well as medial lateral joint lines.  He has laxity of the MCL.  Imaging: No results found. No images are attached to the encounter.  Labs: Lab Results  Component Value Date   HGBA1C 5.3 03/05/2020   HGBA1C 5.7 (H) 03/14/2017   HGBA1C 5.0 10/30/2014   ESRSEDRATE 28 (H) 10/30/2014   CRP 6.5 (H) 10/30/2014   REPTSTATUS 12/31/2020 FINAL 12/29/2020   CULT MULTIPLE SPECIES PRESENT, SUGGEST RECOLLECTION (A) 12/29/2020     Lab Results  Component Value Date   ALBUMIN 2.6 (L) 12/31/2020   ALBUMIN 2.8 (L) 12/30/2020   ALBUMIN 3.1 (L) 12/29/2020   PREALBUMIN 9.4 (L) 10/30/2014    Lab Results  Component Value Date   MG 2.1 02/24/2021   MG 1.9 01/01/2021   MG 2.0  12/30/2020   No results found for: VD25OH  Lab Results  Component Value Date   PREALBUMIN 9.4 (L) 10/30/2014   CBC EXTENDED Latest Ref Rng & Units 02/27/2021 02/26/2021 02/25/2021  WBC 4.0 - 10.5 K/uL 7.3 7.2 6.7  RBC 4.22 - 5.81 MIL/uL 2.81(L) 3.01(L) 2.84(L)  HGB 13.0 - 17.0 g/dL 8.1(L) 8.4(L) 7.8(L)  HCT 39.0 - 52.0 % 25.9(L) 27.4(L) 26.0(L)  PLT 150 - 400 K/uL 258 260 247  NEUTROABS 1.7 - 7.7 K/uL - - -  LYMPHSABS 0.7 - 4.0 K/uL - - -     There is no height or weight on file to calculate BMI.  Orders:  No orders of the defined types were placed in this encounter.  No orders of the defined types were placed in this encounter.    Procedures: Large Joint Inj: R knee on 04/20/2021 11:42 AM Indications: pain and diagnostic evaluation Details: 22 G 1.5 in needle, anteromedial approach  Arthrogram: No  Medications: 5 mL lidocaine (PF) 1 %; 40 mg methylPREDNISolone acetate 40 MG/ML Outcome: tolerated well, no immediate complications Procedure, treatment alternatives, risks and benefits explained, specific risks discussed. Consent was given by the patient. Immediately prior to procedure a time out was called to verify the correct patient, procedure, equipment, support staff and site/side marked as  required. Patient was prepped and draped in the usual sterile fashion.     Clinical Data: No additional findings.  ROS:  All other systems negative, except as noted in the HPI. Review of Systems  Objective: Vital Signs: There were no vitals taken for this visit.  Specialty Comments:  No specialty comments available.  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   Diabetes mellitus without complication (HCC)    COPD (chronic obstructive pulmonary disease) (HCC)    CAD (coronary artery disease)    Severe aortic stenosis    GERD (gastroesophageal reflux disease)    Protein-calorie malnutrition, severe 03/05/2020   Tobacco abuse 03/03/2020   Pressure injury of skin 03/15/2017   Chronic venous insufficiency 04/28/2014   PAD (peripheral artery disease) ([place]) 01/21/2014   Varicose veins of lower extremities with other complications 62/26/3335   Hx of TIA (transient ischemic attack) and stroke 12/31/2013   HLD (hyperlipidemia) 12/31/2013   Severe obesity (BMI >= 40) ([place]) 12/31/2013   Essential hypertension, benign 09/13/2012   Edema 09/13/2012   Past Medical History:  Diagnosis Date   Anxiety    Arthritis    knee and neck   CAD (coronary artery disease)    cath 9/21: LAD calcified, pLCx 23, mRCA 41, dRCA 30   Chronic diastolic CHF    COPD (chronic obstructive pulmonary disease) ([place])    Depression    Diabetes mellitus without complication (HCC)    GERD (gastroesophageal reflux disease)    Hyperlipidemia    Hypertension    Migraine    Obesity    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 Burt Knack and Dr. Cyndia Bent   Severe aortic stenosis    Severe aortic insufficiency, moderate to severe aortic stenosis   Sleep apnea    CPAP    Venous insufficiency    managed by ortho (Dr. Sharol Given)    Family History  Problem Relation Age of Onset   Hypertension Mother    Alzheimer's disease Mother    Stroke Father        x 3   Heart attack Father    Congestive Heart Failure Father    Diabetes Other        mat great aunts   Colon cancer Neg Hx    Rectal cancer Neg Hx    Esophageal cancer Neg Hx     Past Surgical History:  Procedure  Laterality Date   ANKLE SURGERY Right    Dr. Marcelino Scot, has pins   COLONOSCOPY W/ POLYPECTOMY     KNEE ARTHROSCOPY Right    Dr. Noemi Chapel   MULTIPLE EXTRACTIONS WITH ALVEOLOPLASTY Bilateral 05/22/2020   Procedure: MULTIPLE EXTRACTION WITH ALVEOLOPLASTY;  Surgeon: Diona Browner, DMD;  Location: [facility] OR;  Service: Oral Surgery;  Laterality: Bilateral;   right foot surgery,rt great toe callus removal     RIGHT/LEFT HEART CATH AND CORONARY ANGIOGRAPHY N/A 03/06/2020   Procedure: RIGHT/LEFT HEART CATH AND CORONARY ANGIOGRAPHY;  Surgeon: Sherren Mocha, MD;  Location: [facility];  Service: Cardiovascular;  Laterality: N/A;   TEE WITHOUT CARDIOVERSION N/A 03/09/2020   Procedure: TRANSESOPHAGEAL ECHOCARDIOGRAM (TEE);  Surgeon: Dorothy Spark, MD;  Location: [facility] ENDOSCOPY;  Service: Cardiovascular;  Laterality: N/A;   TEE WITHOUT CARDIOVERSION N/A 06/30/2020   Procedure: TRANSESOPHAGEAL ECHOCARDIOGRAM (TEE);  Surgeon: Sherren Mocha, MD;  Location: [facility];  Service: Open Heart Surgery;  Laterality: N/A;   TONSILLECTOMY AND ADENOIDECTOMY     age 60   TRANSCATHETER AORTIC VALVE REPLACEMENT, TRANSFEMORAL N/A 06/30/2020   Procedure: TRANSCATHETER AORTIC VALVE REPLACEMENT, TRANSFEMORAL;  Surgeon: Sherren Mocha, MD;  Location: [facility];  Service: Open Heart Surgery;  Laterality: N/A;   Social History   Occupational History   Occupation: retired Youth worker: RETIRED  Tobacco Use   Smoking status: Some Days    Packs/day: 0.75    Years: 55.00    Pack years: 41.25    Types: Cigarettes   Smokeless tobacco: Never  Vaping Use   Vaping Use: Never used  Substance and Sexual Activity   Alcohol use: No    Alcohol/week: 0.0 standard drinks   Drug use: No   Sexual activity: Yes    Birth control/protection: Condom

## 2021-04-26 ENCOUNTER — Ambulatory Visit: Payer: Medicare PPO | Admitting: Cardiology

## 2021-04-30 ENCOUNTER — Ambulatory Visit: Payer: Medicare PPO | Admitting: Cardiology

## 2021-05-25 ENCOUNTER — Other Ambulatory Visit: Payer: Self-pay | Admitting: Physician Assistant

## 2021-06-08 ENCOUNTER — Ambulatory Visit: Payer: Medicare PPO | Admitting: Orthopedic Surgery

## 2021-06-08 ENCOUNTER — Other Ambulatory Visit: Payer: Self-pay

## 2021-06-08 DIAGNOSIS — M1711 Unilateral primary osteoarthritis, right knee: Secondary | ICD-10-CM

## 2021-06-08 DIAGNOSIS — I872 Venous insufficiency (chronic) (peripheral): Secondary | ICD-10-CM

## 2021-06-09 ENCOUNTER — Encounter: Payer: Self-pay | Admitting: Orthopedic Surgery

## 2021-06-09 DIAGNOSIS — M1711 Unilateral primary osteoarthritis, right knee: Secondary | ICD-10-CM

## 2021-06-09 MED ORDER — METHYLPREDNISOLONE ACETATE 40 MG/ML IJ SUSP
40.0000 mg | INTRAMUSCULAR | Status: AC | PRN
Start: 2021-06-09 — End: 2021-06-09
  Administered 2021-06-09: 11:00:00 40 mg via INTRA_ARTICULAR

## 2021-06-09 MED ORDER — LIDOCAINE HCL (PF) 1 % IJ SOLN
5.0000 mL | INTRAMUSCULAR | Status: AC | PRN
  Administered 2021-06-09: 11:00:00 5 mL

## 2021-06-09 NOTE — Progress Notes (Signed)
Office Visit Note   Patient: Jordan [name]           Date of Birth: 1951-01-23           MRN: [id number] Visit Date: 06/08/2021              Requested by: [name], MD [address] [place],  [place] [address] PCP: [name], MD  Chief Complaint  Patient presents with   Left Leg - Follow-up   Right Leg - Follow-up      HPI: Patient is a 70 year old gentleman who has venous insufficiency both legs currently wearing the Vive compression stockings.  Patient complains of onychomycotic nails as well as recurrent arthritic pain in the right knee.  Assessment & Plan: Visit Diagnoses:  1. Venous stasis dermatitis of both lower extremities   2. Arthritis of right knee     Plan: Right knee was injected he tolerated this well and nails were trimmed without problems.  Continue with the compression socks.  Follow-Up Instructions: Return if symptoms worsen or fail to improve.   Ortho Exam  Patient is alert, oriented, no adenopathy, well-dressed, normal affect, normal respiratory effort. Examination patient has thickened discolored onychomycotic nails these were trimmed without complications.  There is brawny edema in both legs but no open ulcers no drainage no cellulitis.  Examination right knee there is tenderness to palpation of the patellofemoral joint as well as medial lateral joint line.  There is crepitation with range of motion of the right knee.  Imaging: No results found. No images are attached to the encounter.  Labs: Lab Results  Component Value Date   HGBA1C 5.3 03/05/2020   HGBA1C 5.7 (H) 03/14/2017   HGBA1C 5.0 10/30/2014   ESRSEDRATE 28 (H) 10/30/2014   CRP 6.5 (H) 10/30/2014   REPTSTATUS 12/31/2020 FINAL 12/29/2020   CULT MULTIPLE SPECIES PRESENT, SUGGEST RECOLLECTION (A) 12/29/2020     Lab Results  Component Value Date   ALBUMIN 2.6 (L) 12/31/2020   ALBUMIN 2.8 (L) 12/30/2020   ALBUMIN 3.1 (L) 12/29/2020   PREALBUMIN 9.4 (L) 10/30/2014     Lab Results  Component Value Date   MG 2.1 02/24/2021   MG 1.9 01/01/2021   MG 2.0 12/30/2020   No results found for: VD25OH  Lab Results  Component Value Date   PREALBUMIN 9.4 (L) 10/30/2014   CBC EXTENDED Latest Ref Rng & Units 02/27/2021 02/26/2021 02/25/2021  WBC 4.0 - 10.5 K/uL 7.3 7.2 6.7  RBC 4.22 - 5.81 MIL/uL 2.81(L) 3.01(L) 2.84(L)  HGB 13.0 - 17.0 g/dL 8.1(L) 8.4(L) 7.8(L)  HCT 39.0 - 52.0 % 25.9(L) 27.4(L) 26.0(L)  PLT 150 - 400 K/uL 258 260 247  NEUTROABS 1.7 - 7.7 K/uL - - -  LYMPHSABS 0.7 - 4.0 K/uL - - -     There is no height or weight on file to calculate BMI.  Orders:  No orders of the defined types were placed in this encounter.  No orders of the defined types were placed in this encounter.    Procedures: Large Joint Inj: R knee on 06/09/2021 11:29 AM Indications: pain and diagnostic evaluation Details: 22 G 1.5 in needle, anteromedial approach  Arthrogram: No  Medications: 5 mL lidocaine (PF) 1 %; 40 mg methylPREDNISolone acetate 40 MG/ML Outcome: tolerated well, no immediate complications Procedure, treatment alternatives, risks and benefits explained, specific risks discussed. Consent was given by the patient. Immediately prior to procedure a time out was called to verify the correct patient, procedure,  equipment, support staff and site/side marked as required. Patient was prepped and draped in the usual sterile fashion.     Clinical Data: No additional findings.  ROS:  All other systems negative, except as noted in the HPI. Review of Systems  Objective: Vital Signs: There were no vitals taken for this visit.  Specialty Comments:  No specialty comments available.  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   Diabetes mellitus without complication (HCC)    COPD (chronic obstructive pulmonary disease) (HCC)    CAD (coronary artery disease)    Severe aortic stenosis    GERD (gastroesophageal reflux disease)    Protein-calorie malnutrition, severe 03/05/2020   Tobacco abuse 03/03/2020   Pressure injury of skin 03/15/2017   Chronic venous insufficiency 04/28/2014   PAD (peripheral artery disease) ([place]) 01/21/2014   Varicose veins of lower extremities with other complications 15/17/6160   Hx of TIA (transient ischemic attack) and stroke 12/31/2013   HLD (hyperlipidemia) 12/31/2013   Severe obesity (BMI >= 40) ([place] [place]) 12/31/2013   Essential hypertension, benign 09/13/2012   Edema 09/13/2012   Past Medical History:  Diagnosis Date   Anxiety    Arthritis    knee and neck   CAD (coronary artery disease)    cath 9/21: LAD calcified, pLCx 1, mRCA 46, dRCA 30   Chronic diastolic CHF    COPD (chronic obstructive pulmonary disease) ([facility])    Depression    Diabetes mellitus without complication (HCC)    GERD (gastroesophageal reflux disease)    Hyperlipidemia    Hypertension    Migraine    Obesity    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 Burt Knack and Dr. Cyndia Bent   Severe aortic stenosis    Severe aortic insufficiency, moderate to severe aortic stenosis   Sleep apnea    CPAP    Venous insufficiency    managed by ortho (Dr. Sharol Given)    Family History  Problem Relation Age of Onset   Hypertension Mother    Alzheimer's disease Mother    Stroke Father        x 3   Heart attack Father    Congestive Heart Failure Father    Diabetes Other        mat great aunts   Colon cancer Neg Hx     Rectal cancer Neg Hx    Esophageal cancer Neg Hx     Past Surgical History:  Procedure Laterality Date   ANKLE SURGERY Right    Dr. Marcelino Scot, has pins   COLONOSCOPY W/ POLYPECTOMY     KNEE ARTHROSCOPY Right    Dr. Noemi Chapel   MULTIPLE EXTRACTIONS WITH ALVEOLOPLASTY Bilateral 05/22/2020   Procedure: MULTIPLE EXTRACTION WITH ALVEOLOPLASTY;  Surgeon: Diona Browner, DMD;  Location: [facility] OR;  Service: Oral Surgery;  Laterality: Bilateral;   right foot surgery,rt great toe callus removal     RIGHT/LEFT HEART CATH AND CORONARY ANGIOGRAPHY N/A 03/06/2020   Procedure: RIGHT/LEFT HEART CATH AND CORONARY  ANGIOGRAPHY;  Surgeon: Sherren Mocha, MD;  Location: [facility];  Service: Cardiovascular;  Laterality: N/A;   TEE WITHOUT CARDIOVERSION N/A 03/09/2020   Procedure: TRANSESOPHAGEAL ECHOCARDIOGRAM (TEE);  Surgeon: Dorothy Spark, MD;  Location: [facility];  Service: Cardiovascular;  Laterality: N/A;   TEE WITHOUT CARDIOVERSION N/A 06/30/2020   Procedure: TRANSESOPHAGEAL ECHOCARDIOGRAM (TEE);  Surgeon: Sherren Mocha, MD;  Location: [facility];  Service: Open Heart Surgery;  Laterality: N/A;   TONSILLECTOMY AND ADENOIDECTOMY     age 22   TRANSCATHETER AORTIC VALVE REPLACEMENT, TRANSFEMORAL N/A 06/30/2020   Procedure: TRANSCATHETER AORTIC VALVE REPLACEMENT, TRANSFEMORAL;  Surgeon: Sherren Mocha, MD;  Location: [facility];  Service: Open Heart Surgery;  Laterality: N/A;   Social History   Occupational History   Occupation: retired Youth worker: RETIRED  Tobacco Use   Smoking status: Some Days    Packs/day: 0.75    Years: 55.00    Pack years: 41.25    Types: Cigarettes   Smokeless tobacco: Never  Vaping Use   Vaping Use: Never used  Substance and Sexual Activity   Alcohol use: No    Alcohol/week: 0.0 standard drinks   Drug use: No   Sexual activity: Yes    Birth control/protection: Condom

## 2021-06-22 ENCOUNTER — Other Ambulatory Visit: Payer: Self-pay | Admitting: Cardiology

## 2021-06-22 ENCOUNTER — Encounter: Payer: Self-pay | Admitting: Physician Assistant

## 2021-06-22 DIAGNOSIS — I350 Nonrheumatic aortic (valve) stenosis: Secondary | ICD-10-CM

## 2021-06-22 DIAGNOSIS — Z952 Presence of prosthetic heart valve: Secondary | ICD-10-CM

## 2021-06-22 NOTE — Telephone Encounter (Signed)
Error

## 2021-06-25 ENCOUNTER — Ambulatory Visit: Payer: Medicare PPO | Admitting: Physician Assistant

## 2021-06-25 ENCOUNTER — Ambulatory Visit (HOSPITAL_COMMUNITY): Payer: Medicare PPO

## 2021-06-27 IMAGING — CR DG CHEST 2V
2 series · 2 of 2 positions shown · non-contrast
Comparison: 06/05/2020

Correlation: CT angio chest 03/07/2020

CLINICAL DATA: Severe aortic stenosis, history CHF, COPD, diabetes
mellitus, hypertension

EXAM:
CHEST - 2 VIEW

[w chest pa]
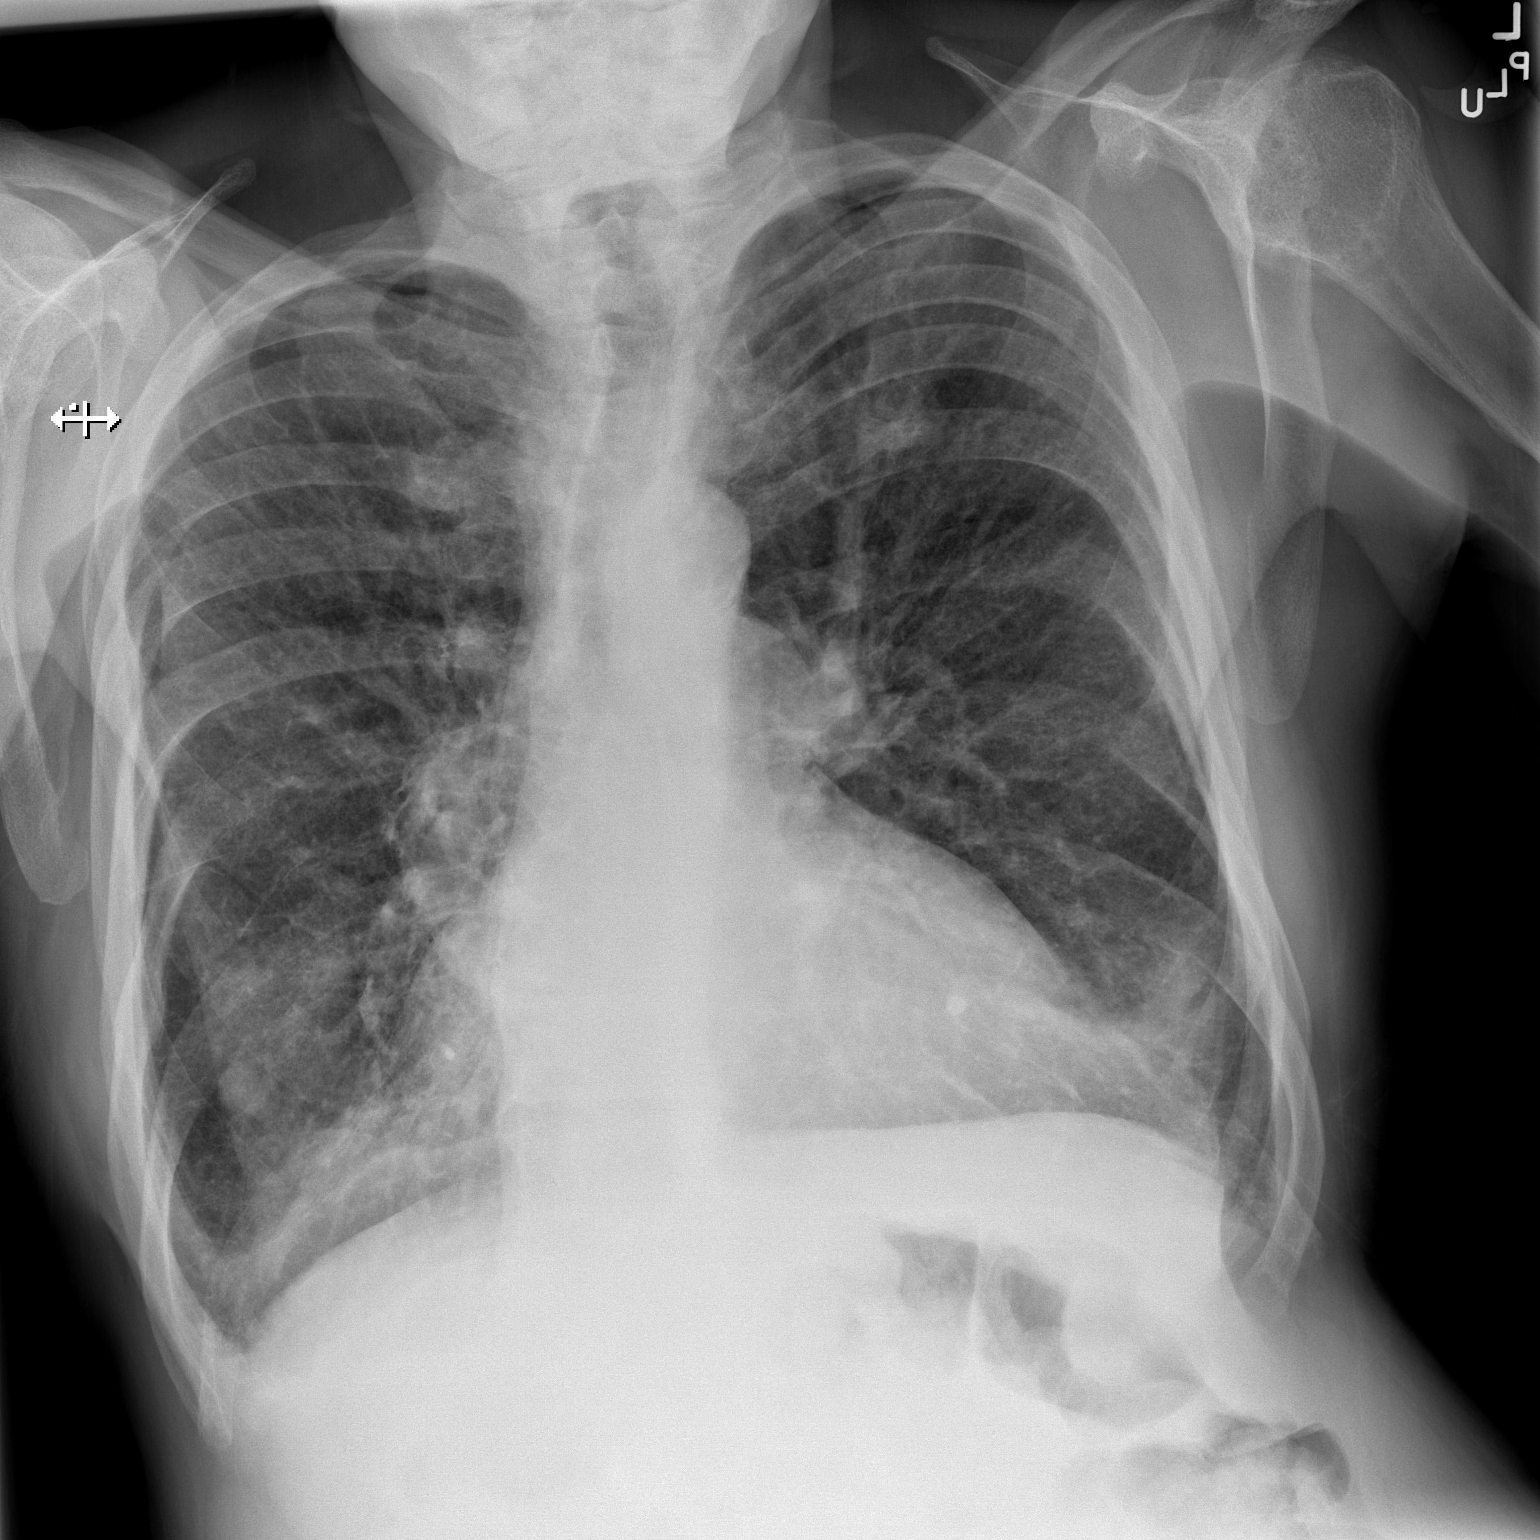

[w chest lat]
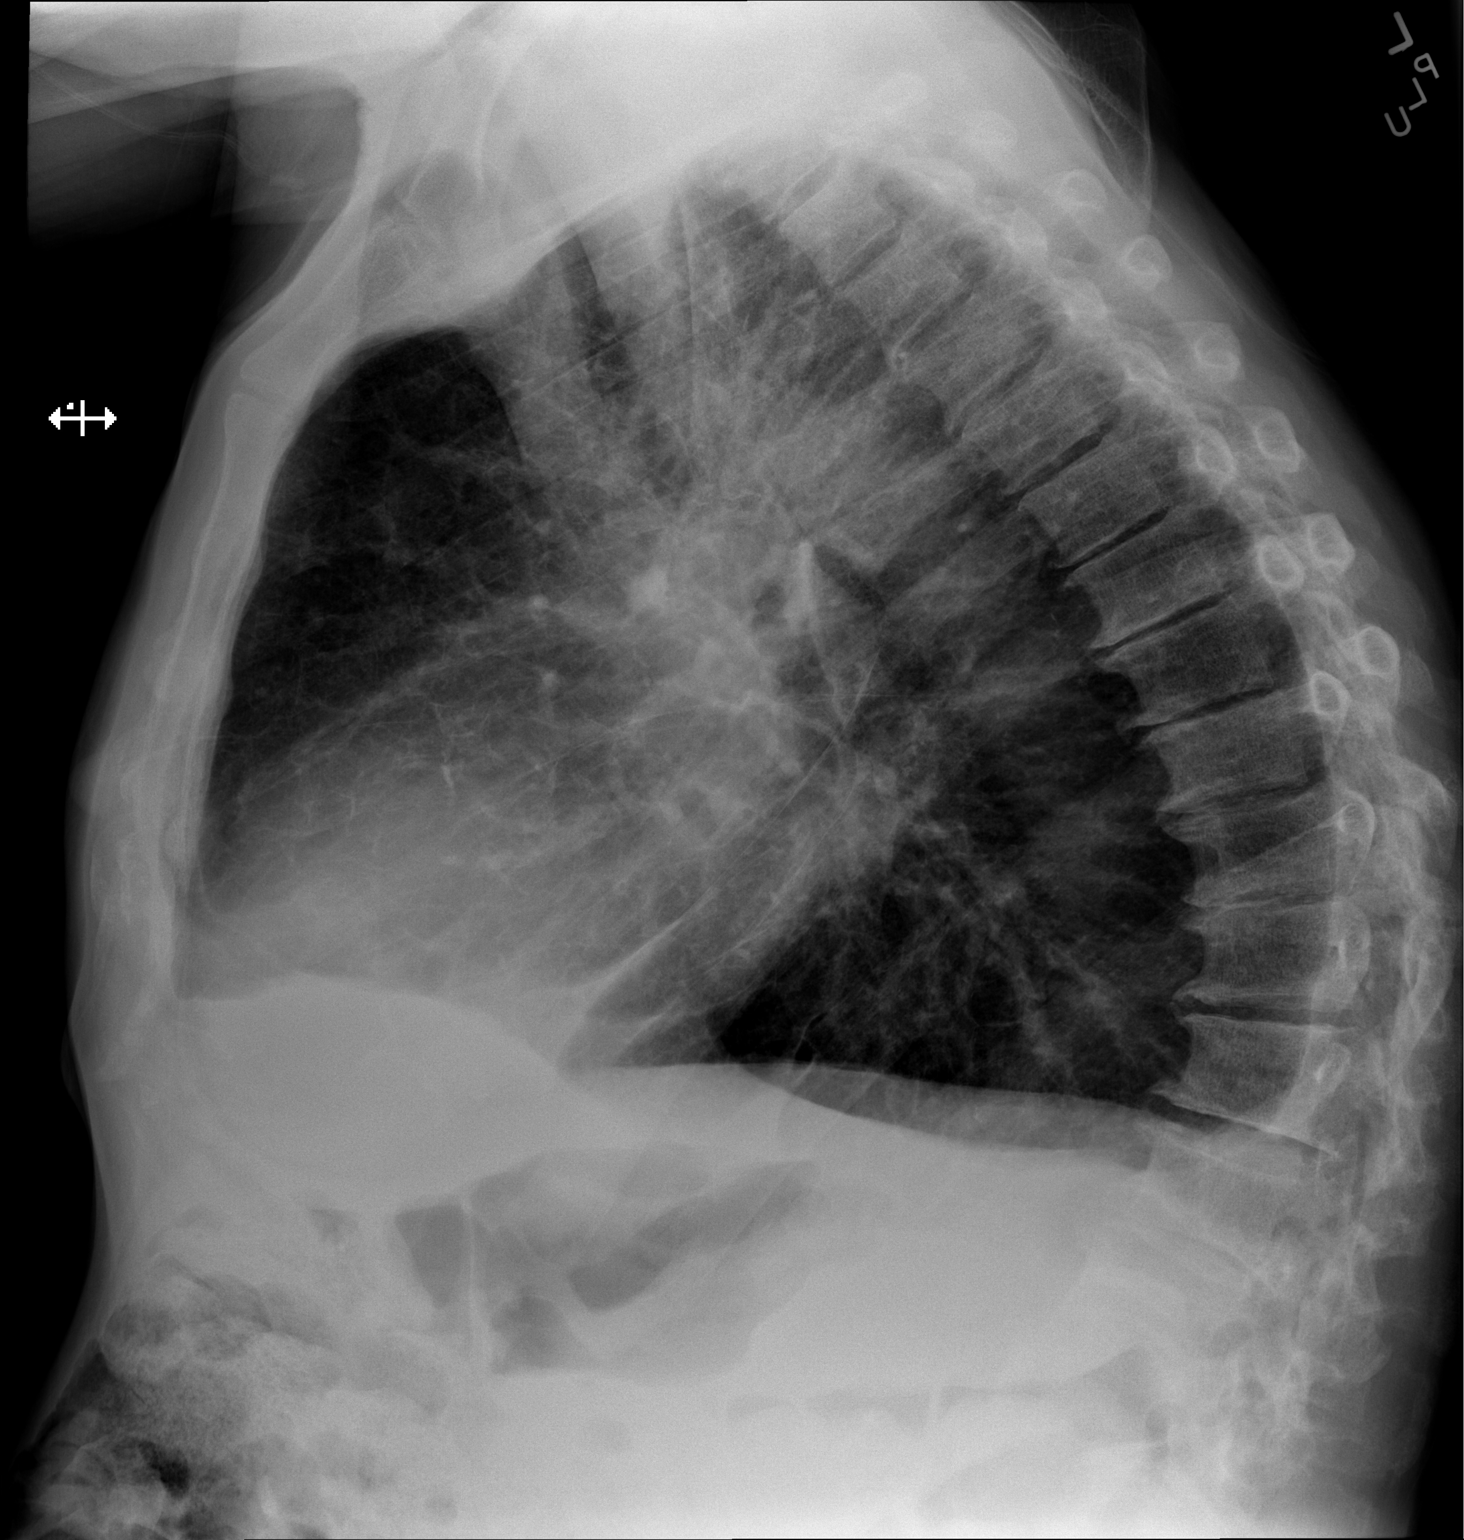

[2 of 2 positions shown; findings below may reference images not displayed]

FINDINGS: Upper normal size of cardiac silhouette with pulmonary vascular
congestion.

Mediastinal contours normal.

Atherosclerotic calcification aorta.

Probable nipple shadows, unchanged; no pulmonary nodule seen on
earlier CT exam.

Chronic bronchitic changes and emphysematous changes consistent with
COPD.

No pulmonary pulmonary infiltrate, pleural effusion or pneumothorax.

Bones demineralized.
IMPRESSION: COPD changes.

No acute abnormalities.

Again identified probable BILATERAL nipple shadows.

## 2021-07-02 ENCOUNTER — Ambulatory Visit: Payer: Medicare PPO | Admitting: Physician Assistant

## 2021-07-02 ENCOUNTER — Encounter: Payer: Self-pay | Admitting: Physician Assistant

## 2021-07-02 ENCOUNTER — Ambulatory Visit (HOSPITAL_COMMUNITY): Payer: Medicare PPO

## 2021-07-02 ENCOUNTER — Other Ambulatory Visit: Payer: Self-pay

## 2021-07-02 ENCOUNTER — Other Ambulatory Visit (HOSPITAL_COMMUNITY): Payer: Self-pay

## 2021-07-02 VITALS — BP 126/60 | HR 62 | Ht 73.0 in | Wt 183.0 lb

## 2021-07-02 DIAGNOSIS — Z952 Presence of prosthetic heart valve: Secondary | ICD-10-CM | POA: Diagnosis not present

## 2021-07-02 DIAGNOSIS — M179 Osteoarthritis of knee, unspecified: Secondary | ICD-10-CM

## 2021-07-02 DIAGNOSIS — I872 Venous insufficiency (chronic) (peripheral): Secondary | ICD-10-CM | POA: Diagnosis not present

## 2021-07-02 DIAGNOSIS — I4819 Other persistent atrial fibrillation: Secondary | ICD-10-CM

## 2021-07-02 DIAGNOSIS — N183 Chronic kidney disease, stage 3 unspecified: Secondary | ICD-10-CM

## 2021-07-02 DIAGNOSIS — I10 Essential (primary) hypertension: Secondary | ICD-10-CM

## 2021-07-02 DIAGNOSIS — D649 Anemia, unspecified: Secondary | ICD-10-CM

## 2021-07-02 DIAGNOSIS — I5032 Chronic diastolic (congestive) heart failure: Secondary | ICD-10-CM

## 2021-07-02 MED ORDER — APIXABAN 5 MG PO TABS: 5.0000 mg | ORAL_TABLET | Freq: Two times a day (BID) | ORAL | 1 refills | Status: DC

## 2021-07-02 MED ORDER — METOPROLOL TARTRATE 75 MG PO TABS
75.0000 mg | ORAL_TABLET | Freq: Two times a day (BID) | ORAL | 3 refills | Status: DC
Start: 2021-07-02 — End: 2021-11-03

## 2021-07-02 NOTE — Progress Notes (Addendum)
[facility] [place]                                     Cardiology Office Note:    Date:  07/02/2021   ID:  Jordan Jordan, DOB 09/04/1950, MRN [id number]  PCP:  Jordan Bowen, MD  [facility] Cardiologist:  Jordan Furbish, MD / Dr. Burt [name] & Dr. Cyndia [name] (TAVR) [facility] Electrophysiologist:  None   Referring MD: Jordan Bowen, MD   1 year s/p TAVR  History of Present Illness:    Jordan Jordan is a 71 y.o. 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   He was admitted in September 2021 with acute hypoxic respiratory failure due to acute diastolic congestive heart failure and aortic valve disease. He improved with IV diuresis which was complicated by acute kidney injury.  This resolved and he was maintained on oral Lasix.  He had a 2D echocardiogram on 9/15/2021w hich had poor windows but reportedly showed mild aortic insufficiency and moderate to severe aortic stenosis with a mean gradient of 36.5 mmHg and a valve area of 0.68 cm.  He subsequently had a TEE done on 03/09/2020 which showed a calcified and thickened aortic valve with restricted leaflet opening and a mean gradient of 43 mmHg. There was severe aortic insufficiency.  Left ventricular ejection fraction was 60 to 65%. Cardiac catheterization showed nonobstructive coronary disease.     He was evaluated by the multidisciplinary valve team and underwent successful TAVR with a 26 mm Edwards Sapien 3 Ultra THV via the TF approach on 06/30/20. Post operative echo showed EF 60%, normally functioning TAVR with a mean gradient of 19 mm hg and no PVL (gradients noted to be more increased than post deployment). 1 month echo showed EF  60% normally functioning TAVR with a mean gradient of 14.6 mm hg and mild-mod PVL (noted to be more prominent than previous studies)  He was admitted in July 2022 with left leg cellulitis complicated by decompensated heart failure and atrial fibrillation with rapid ventricular rate. He was evaluated by cardiology.  He was placed on Apixaban for anticoagulation and rate control therapy was adjusted.  He was admitted again 9/6-9/10 with decompensated heart failure complicated by atrial fibrillation with rapid rate.  Cardiology was not involved in his care during this admission.  His rate control was adjusted.  He was diuresed.  He has chronic anemia and had worsening hemoglobin.  He did receive 1 unit PRBCs.  Apixaban was resumed prior to discharge with stable hemoglobin.  He was seen by Jordan Dopp PA-C for follow up in 02/2021. His BP had been running low and lasix was changed to 40 mg every other day alternating with 20 mg every other day. DCCV was discussed but Jordan Jordan wanted to wait until he knew anemia was stable and that he could stay on Eliquis without interruption.  Labs from PCP received and personally reviewed. 03/12/2021: Hgb 9.7, MCV 88.9, platelet count 228K, creatinine 1.0, K+ 4.5   Today he presents to clinic for follow up.  He is doing well. LE edema much better controlled under the care of Dr.  Sharol [name] with his compression stockings and wrapping. He lost 60 lbs when he stayed at [facility] after recent admission. No CP or SOB. No LE edema, orthopnea or PND. No dizziness or syncope. No blood in stool or urine. No palpitations. He asked about cardioversion today as mentioned in [facility] office visit.     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 Burt [name] and Dr. Cyndia [name]   Severe aortic stenosis    Severe aortic insufficiency, moderate to severe aortic stenosis   Sleep apnea    CPAP    Venous insufficiency    managed by ortho (Jordan Jordan)    Past Surgical History:  Procedure Laterality Date   ANKLE SURGERY Right    Dr. Marcelino [name], has pins   COLONOSCOPY W/ POLYPECTOMY     KNEE ARTHROSCOPY Right    Dr. Noemi [name]   MULTIPLE EXTRACTIONS WITH ALVEOLOPLASTY Bilateral 05/22/2020   Procedure: MULTIPLE EXTRACTION WITH ALVEOLOPLASTY;  Surgeon: Jordan Jordan, Jordan [name];  Location: [facility] OR;  Service: Oral Surgery;  Laterality: Bilateral;   right foot surgery,rt great toe callus removal     RIGHT/LEFT HEART CATH AND CORONARY ANGIOGRAPHY N/A 03/06/2020   Procedure: RIGHT/LEFT HEART CATH AND CORONARY ANGIOGRAPHY;  Surgeon: Jordan Mocha, MD;  Location: [facility];  Service: Cardiovascular;  Laterality: N/A;   TEE WITHOUT CARDIOVERSION N/A 03/09/2020   Procedure: TRANSESOPHAGEAL ECHOCARDIOGRAM (TEE);  Surgeon: Jordan Spark, MD;  Location: [facility] ENDOSCOPY;  Service: Cardiovascular;  Laterality: N/A;   TEE WITHOUT CARDIOVERSION N/A 06/30/2020   Procedure: TRANSESOPHAGEAL ECHOCARDIOGRAM (TEE);  Surgeon: Jordan Mocha, MD;  Location: [facility];  Service: Open Heart Surgery;  Laterality: N/A;   TONSILLECTOMY AND ADENOIDECTOMY     age 39   TRANSCATHETER AORTIC VALVE REPLACEMENT, TRANSFEMORAL N/A 06/30/2020   Procedure: TRANSCATHETER AORTIC VALVE REPLACEMENT, TRANSFEMORAL;  Surgeon: Jordan Mocha, MD;  Location: [facility];  Service: Open Heart Surgery;  Laterality: N/A;    Current Medications: Current Meds  Medication Sig   atorvastatin (LIPITOR) 80 MG tablet Take 80 mg by mouth daily after supper.     bismuth subsalicylate (PEPTO BISMOL) 262 MG/15ML suspension Take 30 mLs by mouth every 6 (six) hours as needed for indigestion.   budesonide-formoterol (SYMBICORT) 160-4.5 MCG/ACT inhaler Inhale 2 puffs into the lungs in the morning and at bedtime.   butalbital-acetaminophen-caffeine (FIORICET, ESGIC) 50-325-40 MG per tablet Take 1 tablet by mouth 2 (two) times daily as needed for migraine.    carboxymethylcellulose (REFRESH PLUS) 0.5 % SOLN Place 1 drop into both eyes daily as needed (dry eyes).   folic acid (FOLVITE) 1 MG tablet Take 1 tablet (1 mg total) by mouth daily.   furosemide (LASIX) 40 MG tablet Take 1 tablet by mouth every other day alternating with 1/2 tablet every other day   ipratropium-albuterol (DUONEB) 0.5-2.5 (3) MG/3ML SOLN Take 3 mLs by nebulization every 6 (six)  hours as needed (shortness of breath/wheezing).   LORazepam (ATIVAN) 1 MG tablet Take 1 mg by mouth 3 (three) times daily.   methocarbamol (ROBAXIN) 750 MG tablet Take 750 mg by mouth as needed for muscle spasms.   montelukast (SINGULAIR) 10 MG tablet Take 10 mg by mouth as needed (for allergies).   Multiple Vitamin (MULTIVITAMIN WITH MINERALS) TABS Take 1 tablet by mouth daily.   nystatin cream (MYCOSTATIN) Apply 1 application topically 2 (two) times daily.   pantoprazole (PROTONIX) 40 MG tablet Take 1 tablet (40 mg total) by mouth daily.   promethazine (PHENERGAN) 25 MG tablet Take 25 mg by mouth every 8 (eight) hours as needed for nausea/vomiting.   venlafaxine XR (EFFEXOR-XR) 75 MG 24 hr capsule Take 75 mg by mouth in the morning and at bedtime. MUST HAVE BRAND NAME ONLY     Allergies:   Altace [ramipril], Codeine, and Naproxen   Social History   Socioeconomic History   Marital status: Single    Spouse name: Not on file   Number of children: 0   Years of education: Not on file   Highest education level: Not on file  Occupational History   Occupation: retired Youth worker: RETIRED   Tobacco Use   Smoking status: Some Days    Packs/day: 0.75    Years: 55.00    Pack years: 41.25    Types: Cigarettes   Smokeless tobacco: Never  Vaping Use   Vaping Use: Never used  Substance and Sexual Activity   Alcohol use: No    Alcohol/week: 0.0 standard drinks   Drug use: No   Sexual activity: Yes    Birth control/protection: Condom  Other Topics Concern   Not on file  Social History Narrative   Lives alone.     Social Determinants of Health   Financial Resource Strain: Not on file  Food Insecurity: Not on file  Transportation Needs: Not on file  Physical Activity: Not on file  Stress: Not on file  Social Connections: Not on file     Family History: The patient's family history includes Alzheimer's disease in his mother; Congestive Heart Failure in his father; Diabetes in an other family member; Heart attack in his father; Hypertension in his mother; Stroke in his father. There is no history of Colon cancer, Rectal cancer, or Esophageal cancer.  ROS:   Please see the history of present illness.    All other systems reviewed and are negative.  EKGs/Labs/Other Studies Reviewed:    The following studies were reviewed today: TAVR OPERATIVE NOTE     Date of Procedure:                06/30/2020   Preoperative Diagnosis:      Severe Aortic Stenosis    Postoperative Diagnosis:    Same    Procedure:        Transcatheter Aortic Valve Replacement - Percutaneous Left Transfemoral Approach             Edwards Sapien 3 Ultra THV (size 26 mm, model # 9750TFX, serial # [id number])              Co-Surgeons:                        Gaye Pollack, MD and Jordan Mocha, MD     Anesthesiologist:                  C.  Glennon Mac, MD   Echocardiographer:              Liane Comber, MD.   Pre-operative Echo Findings: Severe aortic stenosis, moderate to severe aortic insufficiency Normal left ventricular systolic function   Post-operative Echo Findings: No paravalvular  leak Normal left ventricular systolic function   _____________     Echo 07/01/20:  IMPRESSIONS   1. Left ventricular ejection fraction, by estimation, is 60 to 65%. The  left ventricle has normal function. There is mild left ventricular  hypertrophy.   2. Left atrial size was mildly dilated.   3. The mitral valve is degenerative. Trivial mitral valve regurgitation.  Moderate mitral annular calcification.   4. Post TAVR with 26 mm Sapien 3 valve on 06/30/20 Trivial PVL picked up  on mostly doppler possibly seen at 11:00 on PSSA images but very trivial  Gradients have increased since implant mean 10->19 mmHg peak 25->35 mmHg  AVA 2.5 cm2 with DVI 0.71. The  aortic valve has been repaired/replaced. Aortic valve regurgitation is  trivial.   ________________________  Echo 07/23/20   IMPRESSIONS  1. Left ventricular ejection fraction, by estimation, is 60 to 65%. The left ventricle has normal function. The left ventricle has no regional wall motion abnormalities. The left ventricular internal cavity size was mildly to moderately dilated. There  is mild concentric left ventricular hypertrophy. Left ventricular diastolic parameters are consistent with Grade II diastolic dysfunction (pseudonormalization).  2. Right ventricular systolic function is normal. The right ventricular size is normal. There is mildly elevated pulmonary artery systolic pressure.  3. Left atrial size was moderately dilated.  4. Right atrial size was moderately dilated.  5. The mitral valve is degenerative. Trivial mitral valve regurgitation.  6. Perivalvular leak at the native Stollings commissure.. The aortic valve has been repaired/replaced. Aortic valve regurgitation is mild to moderate. There is a 26 mm Sapien prosthetic (TAVR) valve present in the aortic position. Procedure Date:  06/30/2020. Aortic valve mean gradient measures 14.6 mmHg.  7. The inferior vena cava is dilated in size with >50% respiratory variability,  suggesting right atrial pressure of 8 mmHg.   Comparison(s): Prior images reviewed side by side.   Conclusion(s)/Recommendation(s): S/P TAVR, appears well seated. Mean gradient 15 mmHg, prior was 19 mmHg on most recent echo. Perivalvular leak is more prominent on current study, mild to moderate based on images and PHT.   EKG:  EKG is ordered today and demonstrates afib with CVR  Recent Labs: 12/31/2020: ALT 17 02/23/2021: B Natriuretic Peptide 932.6 02/24/2021: BUN 19; Creatinine, Ser 1.05; Magnesium 2.1; Potassium 4.3; Sodium 138 02/27/2021: Hemoglobin 8.1; Platelets 258  Recent Lipid Panel    Component Value Date/Time   CHOL 108 03/06/2020 0508   TRIG 68 03/06/2020 0508   HDL 30 (L) 03/06/2020 0508   CHOLHDL 3.6 03/06/2020 0508   VLDL 14 03/06/2020 0508   LDLCALC 64 03/06/2020 0508     Risk Assessment/Calculations:       Physical Exam:    VS:  BP 126/60    Pulse 62    Ht 6\' 1"  (1.854 m)    Wt 183 lb (83 kg)    BMI 24.14 kg/m     Wt Readings from Last 3 Encounters:  07/02/21 183 lb (83 kg)  03/16/21 158 lb 9.6 oz (71.9 kg)  02/24/21 167 lb 5.3 oz (75.9 kg)     GEN: appears older than his stated age HEENT: Normal NECK: No JVD LYMPHATICS: No lymphadenopathy CARDIAC: irreg irreg,  no murmurs, rubs, gallops RESPIRATORY:  Clear to auscultation without rales, wheezing or rhonchi  ABDOMEN: Soft, non-tender, non-distended SKIN: Warm and dry. Legs  with 1 + bilateral edema in compression stockings NEUROLOGIC:  Alert and oriented x 3 PSYCHIATRIC:  Normal affect   ASSESSMENT:    1. S/P TAVR (transcatheter aortic valve replacement)   2. Stage 3 chronic kidney disease, unspecified whether stage 3a or 3b CKD ([place])   3. Essential hypertension   4. Chronic venous insufficiency   5. Persistent atrial fibrillation ([place])   6. Normocytic anemia   7. Chronic heart failure with preserved ejection fraction (HCC)     PLAN:    In order of problems listed above:  Severe AS s/p  TAVR: unfortunately the patient left the office today without getting his echo. It has been r/s to 1/25. He has NYHA class II symptoms but has had a marked clinical benefit since TAVR. Continue on Eliquis alone.    CKD stage stage III: check creat today.   HTN: Bp well controlled today. No changes made  Chronic venous stasis with LE edema: much better now and under the care of Jordan Jordan.  Persistent afib: remains in afib and has been tolerating Eliquis without issues or interruption. [name] recurrent admissions for CHF and relatively recent new onset of afib, I think it is reasonable to try to restore sinus. Will get him set up for DCCV 1/27 with Dr. Harriet Masson. He understands the importance of not missing any Eliquis doses from now until at least 4 weeks after cardioversion.   Shared Decision Making/Informed Consent The risks (stroke, cardiac arrhythmias rarely resulting in the need for a temporary or permanent pacemaker, skin irritation or burns and complications associated with conscious sedation including aspiration, arrhythmia, respiratory failure and death), benefits (restoration of normal sinus rhythm) and alternatives of a direct current cardioversion were explained in detail to Mr. Ewen and he agrees to proceed.    Anemia: most recent labs showed Hg 9.7. Will recheck today  Chronic diastolic CHF: appears euvolemic. Continue lasix at current dosing  Knee OA: wants to get knee replaced in near-ish future and wanted cardiology clearance. I think as long as his echo looks okay, he's doing well from a heart failure standpoint (currently okay) and is at least 4 weeks out from DCCV, he can be cleared for surgery.   ADDENDUM: 1/31/23echo came back to someone else's basket, but I reviewed it and shows EF 55-60%, normally functioning TAVR with a mean gradient of 8.2 mmHg and mild PVL in the 12-2 o'clock position.   Medication Adjustments/Labs and Tests Ordered: Current medicines are reviewed at  length with the patient today.  Concerns regarding medicines are outlined above.  Orders Placed This Encounter  Procedures   Basic metabolic panel   CBC   EKG 12-Lead   Meds ordered this encounter  Medications   Metoprolol Tartrate 75 MG TABS    Sig: Take 75 mg by mouth 2 (two) times daily.    Dispense:  180 tablet    Refill:  3    Patient Instructions  Medication Instructions:  Your physician recommends that you continue on your current medications as directed. Please refer to the Current Medication list [name] to you today.  *If you need a refill on your cardiac medications before your next appointment, please call your pharmacy*   Lab Work: Bmp, Cbc- Today   If you have labs (blood work) drawn today and your tests are completely normal, you will  receive your results only by: [place] (if you have MyChart) OR A paper copy in the mail If you have any lab test that is abnormal or we need to change your treatment, we will call you to review the results.   Testing/Procedures: Your physician has recommended that you have a Cardioversion (DCCV). Electrical Cardioversion uses a jolt of electricity to your heart either through paddles or wired patches attached to your chest. This is a controlled, usually prescheduled, procedure. Defibrillation is done under light anesthesia in the hospital, and you usually go home the day of the procedure. This is done to get your heart back into a normal rhythm. You are not awake for the procedure. Please see the instruction sheet [name] to you today.   Follow-Up: Follow up as scheduled    Other Instructions You are scheduled for a Cardioversion on Friday, January 27 with Dr. Berniece Salines. Please arrive at the [facility] (Main Entrance A) at [facility]: [address] [place], [place] [address] at 10 am .   DIET: Nothing to eat or drink after midnight except a sip of water with medications (see medication instructions below)  FYI:  For your safety, and to allow [place] to monitor your vital signs accurately during the surgery/procedure we request that   if you have artificial nails, gel coating, SNS etc. Please have those removed prior to your surgery/procedure. Not having the nail coverings /polish removed may result in cancellation or delay of your surgery/procedure.   Medication Instructions: Hold Lasix the morning of procedure   Continue your anticoagulant: Eliquis  You will need to continue your anticoagulant after your procedure until you are told by your provider that it is safe to stop    Bring your insurance cards.  *Special Note: Every effort is made to have your procedure done on time. Occasionally there are emergencies that occur at the hospital that may cause delays. Please be patient if a delay does occur.     Signed, Angelena Form, PA-C  07/02/2021 6:32 PM    [facility]

## 2021-07-02 NOTE — Telephone Encounter (Signed)
Eliquis 5 mg refill request received. Patient is 71 years old, weight- 83 kg, Crea- 1.05 on 02/24/21, Diagnosis- afiib, and last seen by Dr. Grandville Silos on 07/02/21. Dose is appropriate based on dosing criteria. Will send in refill to requested pharmacy.

## 2021-07-02 NOTE — H&P (View-Only) (Signed)
[facility] [place]                                     Cardiology Office Note:    Date:  07/02/2021   ID:  Jordan [name], DOB 1951-05-07, MRN [id number]  PCP:  [name], MD  [facility] Cardiologist:  Candee Furbish, MD / Dr. Burt Knack & Dr. Cyndia Bent (TAVR) [facility] Electrophysiologist:  None   Referring MD: [name], MD   1 year s/p TAVR  History of Present Illness:    Jordan [name] is a 71 y.o. 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   He was admitted in September 2021 with acute hypoxic respiratory failure due to acute diastolic congestive heart failure and aortic valve disease. He improved with IV diuresis which was complicated by acute kidney injury.  This resolved and he was maintained on oral Lasix.  He had a 2D echocardiogram on 9/15/2021w hich had poor windows but reportedly showed mild aortic insufficiency and moderate to severe aortic stenosis with a mean gradient of 36.5 mmHg and a valve area of 0.68 cm.  He subsequently had a TEE done on 03/09/2020 which showed a calcified and thickened aortic valve with restricted leaflet opening and a mean gradient of 43 mmHg. There was severe aortic insufficiency.  Left ventricular ejection fraction was 60 to 65%. Cardiac catheterization showed nonobstructive coronary disease.     He was evaluated by the multidisciplinary valve team and underwent successful TAVR with a 26 mm Edwards Sapien 3 Ultra THV via the TF approach on 06/30/20. Post operative echo showed EF 60%, normally functioning TAVR with a mean gradient of 19 mm hg and no PVL (gradients noted to be more increased than post deployment). 1 month echo showed EF  60% normally functioning TAVR with a mean gradient of 14.6 mm hg and mild-mod PVL (noted to be more prominent than previous studies)  He was admitted in July 2022 with left leg cellulitis complicated by decompensated heart failure and atrial fibrillation with rapid ventricular rate. He was evaluated by cardiology.  He was placed on Apixaban for anticoagulation and rate control therapy was adjusted.  He was admitted again 9/6-9/10 with decompensated heart failure complicated by atrial fibrillation with rapid rate.  Cardiology was not involved in his care during this admission.  His rate control was adjusted.  He was diuresed.  He has chronic anemia and had worsening hemoglobin.  He did receive 1 unit PRBCs.  Apixaban was resumed prior to discharge with stable hemoglobin.  He was seen by [name] PA-C for follow up in 02/2021. His BP had been running low and lasix was changed to 40 mg every other day alternating with 20 mg every other day. DCCV was discussed but [name] wanted to wait until he knew anemia was stable and that he could stay on Eliquis without interruption.  Labs from PCP received and personally reviewed. 03/12/2021: Hgb 9.7, MCV 88.9, platelet count 228K, creatinine 1.0, K+ 4.5   Today he presents to clinic for follow up.  He is doing well. LE edema much better controlled under the care of Dr.  Sharol Given with his compression stockings and wrapping. He lost 60 lbs when he stayed at [facility] after recent admission. No CP or SOB. No LE edema, orthopnea or PND. No dizziness or syncope. No blood in stool or urine. No palpitations. He asked about cardioversion today as mentioned in [facility] office visit.     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 Burt Knack and Dr. Cyndia Bent   Severe aortic stenosis    Severe aortic insufficiency, moderate to severe aortic stenosis   Sleep apnea    CPAP    Venous insufficiency    managed by ortho (Dr. Sharol Given)    Past Surgical History:  Procedure Laterality Date   ANKLE SURGERY Right    Dr. Marcelino Scot, has pins   COLONOSCOPY W/ POLYPECTOMY     KNEE ARTHROSCOPY Right    Dr. Noemi Chapel   MULTIPLE EXTRACTIONS WITH ALVEOLOPLASTY Bilateral 05/22/2020   Procedure: MULTIPLE EXTRACTION WITH ALVEOLOPLASTY;  Surgeon: Diona Browner, DMD;  Location: [facility] OR;  Service: Oral Surgery;  Laterality: Bilateral;   right foot surgery,rt great toe callus removal     RIGHT/LEFT HEART CATH AND CORONARY ANGIOGRAPHY N/A 03/06/2020   Procedure: RIGHT/LEFT HEART CATH AND CORONARY ANGIOGRAPHY;  Surgeon: Sherren Mocha, MD;  Location: [place] CV LAB;  Service: Cardiovascular;  Laterality: N/A;   TEE WITHOUT CARDIOVERSION N/A 03/09/2020   Procedure: TRANSESOPHAGEAL ECHOCARDIOGRAM (TEE);  Surgeon: Dorothy Spark, MD;  Location: [facility] ENDOSCOPY;  Service: Cardiovascular;  Laterality: N/A;   TEE WITHOUT CARDIOVERSION N/A 06/30/2020   Procedure: TRANSESOPHAGEAL ECHOCARDIOGRAM (TEE);  Surgeon: Sherren Mocha, MD;  Location: [place];  Service: Open Heart Surgery;  Laterality: N/A;   TONSILLECTOMY AND ADENOIDECTOMY     age 34   TRANSCATHETER AORTIC VALVE REPLACEMENT, TRANSFEMORAL N/A 06/30/2020   Procedure: TRANSCATHETER AORTIC VALVE REPLACEMENT, TRANSFEMORAL;  Surgeon: Sherren Mocha, MD;  Location: [place];  Service: Open Heart Surgery;  Laterality: N/A;    Current Medications: Current Meds  Medication Sig   atorvastatin (LIPITOR) 80 MG tablet Take 80 mg by mouth daily after supper.     bismuth subsalicylate (PEPTO BISMOL) 262 MG/15ML suspension Take 30 mLs by mouth every 6 (six) hours as needed for indigestion.   budesonide-formoterol (SYMBICORT) 160-4.5 MCG/ACT inhaler Inhale 2 puffs into the lungs in the morning and at bedtime.   butalbital-acetaminophen-caffeine (FIORICET, ESGIC) 50-325-40 MG per tablet Take 1 tablet by mouth 2 (two) times daily as needed for migraine.    carboxymethylcellulose (REFRESH PLUS) 0.5 % SOLN Place 1 drop into both eyes daily as needed (dry eyes).   folic acid (FOLVITE) 1 MG tablet Take 1 tablet (1 mg total) by mouth daily.   furosemide (LASIX) 40 MG tablet Take 1 tablet by mouth every other day alternating with 1/2 tablet every other day   ipratropium-albuterol (DUONEB) 0.5-2.5 (3) MG/3ML SOLN Take 3 mLs by nebulization every 6 (six)  hours as needed (shortness of breath/wheezing).   LORazepam (ATIVAN) 1 MG tablet Take 1 mg by mouth 3 (three) times daily.   methocarbamol (ROBAXIN) 750 MG tablet Take 750 mg by mouth as needed for muscle spasms.   montelukast (SINGULAIR) 10 MG tablet Take 10 mg by mouth as needed (for allergies).   Multiple Vitamin (MULTIVITAMIN WITH MINERALS) TABS Take 1 tablet by mouth daily.   nystatin cream (MYCOSTATIN) Apply 1 application topically 2 (two) times daily.   pantoprazole (PROTONIX) 40 MG tablet Take 1 tablet (40 mg total) by mouth daily.   promethazine (PHENERGAN) 25 MG tablet Take 25 mg by mouth every 8 (eight) hours as needed for nausea/vomiting.   venlafaxine XR (EFFEXOR-XR) 75 MG 24 hr capsule Take 75 mg by mouth in the morning and at bedtime. MUST HAVE BRAND NAME ONLY     Allergies:   Altace [ramipril], Codeine, and Naproxen   Social History   Socioeconomic History   Marital status: Single    Spouse name: Not on file   Number of children: 0   Years of education: Not on file   Highest education level: Not on file  Occupational History   Occupation: retired Youth worker: RETIRED   Tobacco Use   Smoking status: Some Days    Packs/day: 0.75    Years: 55.00    Pack years: 41.25    Types: Cigarettes   Smokeless tobacco: Never  Vaping Use   Vaping Use: Never used  Substance and Sexual Activity   Alcohol use: No    Alcohol/week: 0.0 standard drinks   Drug use: No   Sexual activity: Yes    Birth control/protection: Condom  Other Topics Concern   Not on file  Social History Narrative   Lives alone.     Social Determinants of Health   Financial Resource Strain: Not on file  Food Insecurity: Not on file  Transportation Needs: Not on file  Physical Activity: Not on file  Stress: Not on file  Social Connections: Not on file     Family History: The patient's family history includes Alzheimer's disease in his mother; Congestive Heart Failure in his father; Diabetes in an other family member; Heart attack in his father; Hypertension in his mother; Stroke in his father. There is no history of Colon cancer, Rectal cancer, or Esophageal cancer.  ROS:   Please see the history of present illness.    All other systems reviewed and are negative.  EKGs/Labs/Other Studies Reviewed:    The following studies were reviewed today: TAVR OPERATIVE NOTE     Date of Procedure:                06/30/2020   Preoperative Diagnosis:      Severe Aortic Stenosis    Postoperative Diagnosis:    Same    Procedure:        Transcatheter Aortic Valve Replacement - Percutaneous Left Transfemoral Approach             Edwards Sapien 3 Ultra THV (size 26 mm, model # 9750TFX, serial # [id number])              Co-Surgeons:                        Gaye Pollack, MD and Sherren Mocha, MD     Anesthesiologist:                  C.  Glennon Mac, MD   Echocardiographer:              Liane Comber, MD.   Pre-operative Echo Findings: Severe aortic stenosis, moderate to severe aortic insufficiency Normal left ventricular systolic function   Post-operative Echo Findings: No paravalvular  leak Normal left ventricular systolic function   _____________     Echo 07/01/20:  IMPRESSIONS   1. Left ventricular ejection fraction, by estimation, is 60 to 65%. The  left ventricle has normal function. There is mild left ventricular  hypertrophy.   2. Left atrial size was mildly dilated.   3. The mitral valve is degenerative. Trivial mitral valve regurgitation.  Moderate mitral annular calcification.   4. Post TAVR with 26 mm Sapien 3 valve on 06/30/20 Trivial PVL picked up  on mostly doppler possibly seen at 11:00 on PSSA images but very trivial  Gradients have increased since implant mean 10->19 mmHg peak 25->35 mmHg  AVA 2.5 cm2 with DVI 0.71. The  aortic valve has been repaired/replaced. Aortic valve regurgitation is  trivial.   ________________________  Echo 07/23/20   IMPRESSIONS  1. Left ventricular ejection fraction, by estimation, is 60 to 65%. The left ventricle has normal function. The left ventricle has no regional wall motion abnormalities. The left ventricular internal cavity size was mildly to moderately dilated. There  is mild concentric left ventricular hypertrophy. Left ventricular diastolic parameters are consistent with Grade II diastolic dysfunction (pseudonormalization).  2. Right ventricular systolic function is normal. The right ventricular size is normal. There is mildly elevated pulmonary artery systolic pressure.  3. Left atrial size was moderately dilated.  4. Right atrial size was moderately dilated.  5. The mitral valve is degenerative. Trivial mitral valve regurgitation.  6. Perivalvular leak at the native [place] commissure.. The aortic valve has been repaired/replaced. Aortic valve regurgitation is mild to moderate. There is a 26 mm Sapien prosthetic (TAVR) valve present in the aortic position. Procedure Date:  06/30/2020. Aortic valve mean gradient measures 14.6 mmHg.  7. The inferior vena cava is dilated in size with >50% respiratory variability,  suggesting right atrial pressure of 8 mmHg.   Comparison(s): Prior images reviewed side by side.   Conclusion(s)/Recommendation(s): S/P TAVR, appears well seated. Mean gradient 15 mmHg, prior was 19 mmHg on most recent echo. Perivalvular leak is more prominent on current study, mild to moderate based on images and PHT.   EKG:  EKG is ordered today and demonstrates afib with CVR  Recent Labs: 12/31/2020: ALT 17 02/23/2021: B Natriuretic Peptide 932.6 02/24/2021: BUN 19; Creatinine, Ser 1.05; Magnesium 2.1; Potassium 4.3; Sodium 138 02/27/2021: Hemoglobin 8.1; Platelets 258  Recent Lipid Panel    Component Value Date/Time   CHOL 108 03/06/2020 0508   TRIG 68 03/06/2020 0508   HDL 30 (L) 03/06/2020 0508   CHOLHDL 3.6 03/06/2020 0508   VLDL 14 03/06/2020 0508   LDLCALC 64 03/06/2020 0508     Risk Assessment/Calculations:       Physical Exam:    VS:  BP 126/60    Pulse 62    Ht 6\' 1"  (1.854 m)    Wt 183 lb (83 kg)    BMI 24.14 kg/m     Wt Readings from Last 3 Encounters:  07/02/21 183 lb (83 kg)  03/16/21 158 lb 9.6 oz (71.9 kg)  02/24/21 167 lb 5.3 oz (75.9 kg)     GEN: appears older than his stated age HEENT: Normal NECK: No JVD LYMPHATICS: No lymphadenopathy CARDIAC: irreg irreg,  no murmurs, rubs, gallops RESPIRATORY:  Clear to auscultation without rales, wheezing or rhonchi  ABDOMEN: Soft, non-tender, non-distended SKIN: Warm and dry. Legs  with 1 + bilateral edema in compression stockings NEUROLOGIC:  Alert and oriented x 3 PSYCHIATRIC:  Normal affect   ASSESSMENT:    1. S/P TAVR (transcatheter aortic valve replacement)   2. Stage 3 chronic kidney disease, unspecified whether stage 3a or 3b CKD ([place])   3. Essential hypertension   4. Chronic venous insufficiency   5. Persistent atrial fibrillation ([place])   6. Normocytic anemia   7. Chronic heart failure with preserved ejection fraction (HCC)     PLAN:    In order of problems listed above:  Severe AS s/p  TAVR: unfortunately the patient left the office today without getting his echo. It has been r/s to 1/25. He has NYHA class II symptoms but has had a marked clinical benefit since TAVR. Continue on Eliquis alone.    CKD stage stage III: check creat today.   HTN: Bp well controlled today. No changes made  Chronic venous stasis with LE edema: much better now and under the care of Dr. Sharol Given.  Persistent afib: remains in afib and has been tolerating Eliquis without issues or interruption. Given recurrent admissions for CHF and relatively recent new onset of afib, I think it is reasonable to try to restore sinus. Will get him set up for DCCV 1/27 with Dr. Harriet Masson. He understands the importance of not missing any Eliquis doses from now until at least 4 weeks after cardioversion.   Shared Decision Making/Informed Consent The risks (stroke, cardiac arrhythmias rarely resulting in the need for a temporary or permanent pacemaker, skin irritation or burns and complications associated with conscious sedation including aspiration, arrhythmia, respiratory failure and death), benefits (restoration of normal sinus rhythm) and alternatives of a direct current cardioversion were explained in detail to Mr. Velazco and he agrees to proceed.    Anemia: most recent labs showed Hg 9.7. Will recheck today  Chronic diastolic CHF: appears euvolemic. Continue lasix at current dosing  Knee OA: wants to get knee replaced in near-ish future and wanted cardiology clearance. I think as long as his echo looks okay, he's doing well from a heart failure standpoint (currently okay) and is at least 4 weeks out from DCCV, he can be cleared for surgery.     Medication Adjustments/Labs and Tests Ordered: Current medicines are reviewed at length with the patient today.  Concerns regarding medicines are outlined above.  Orders Placed This Encounter  Procedures   Basic metabolic panel   CBC   EKG 12-Lead   Meds ordered this encounter   Medications   Metoprolol Tartrate 75 MG TABS    Sig: Take 75 mg by mouth 2 (two) times daily.    Dispense:  180 tablet    Refill:  3    Patient Instructions  Medication Instructions:  Your physician recommends that you continue on your current medications as directed. Please refer to the Current Medication list given to you today.  *If you need a refill on your cardiac medications before your next appointment, please call your pharmacy*   Lab Work: Bmp, Cbc- Today   If you have labs (blood work) drawn today and your tests are completely normal, you will receive your results only by: [place] (if you have MyChart) OR A paper copy in the mail If you have any lab test that is abnormal or we need to change your treatment,  we will call you to review the results.   Testing/Procedures: Your physician has recommended that you have a Cardioversion (DCCV). Electrical Cardioversion uses a jolt of electricity to your heart either through paddles or wired patches attached to your chest. This is a controlled, usually prescheduled, procedure. Defibrillation is done under light anesthesia in the hospital, and you usually go home the day of the procedure. This is done to get your heart back into a normal rhythm. You are not awake for the procedure. Please see the instruction sheet given to you today.   Follow-Up: Follow up as scheduled    Other Instructions You are scheduled for a Cardioversion on Friday, January 27 with Dr. Berniece Salines. Please arrive at the [facility] (Main Entrance A) at [facility]: [address] [place], [place] [address] at 10 am .   DIET: Nothing to eat or drink after midnight except a sip of water with medications (see medication instructions below)  FYI: For your safety, and to allow [place] to monitor your vital signs accurately during the surgery/procedure we request that   if you have artificial nails, gel coating, SNS etc. Please have those removed prior  to your surgery/procedure. Not having the nail coverings /polish removed may result in cancellation or delay of your surgery/procedure.   Medication Instructions: Hold Lasix the morning of procedure   Continue your anticoagulant: Eliquis  You will need to continue your anticoagulant after your procedure until you are told by your provider that it is safe to stop    Bring your insurance cards.  *Special Note: Every effort is made to have your procedure done on time. Occasionally there are emergencies that occur at the hospital that may cause delays. Please be patient if a delay does occur.     Signed, Angelena Form, PA-C  07/02/2021 6:32 PM    [facility]

## 2021-07-02 NOTE — Patient Instructions (Addendum)
Medication Instructions:  Your physician recommends that you continue on your current medications as directed. Please refer to the Current Medication list given to you today.  *If you need a refill on your cardiac medications before your next appointment, please call your pharmacy*   Lab Work: Bmp, Cbc- Today   If you have labs (blood work) drawn today and your tests are completely normal, you will receive your results only by: [place] (if you have MyChart) OR A paper copy in the mail If you have any lab test that is abnormal or we need to change your treatment, we will call you to review the results.   Testing/Procedures: Your physician has recommended that you have a Cardioversion (DCCV). Electrical Cardioversion uses a jolt of electricity to your heart either through paddles or wired patches attached to your chest. This is a controlled, usually prescheduled, procedure. Defibrillation is done under light anesthesia in the hospital, and you usually go home the day of the procedure. This is done to get your heart back into a normal rhythm. You are not awake for the procedure. Please see the instruction sheet given to you today.   Follow-Up: Follow up as scheduled    Other Instructions You are scheduled for a Cardioversion on Friday, January 27 with Dr. Berniece Salines. Please arrive at the [facility] (Main Entrance A) at [facility]: [address] [place], [place] [address] at 10 am .   DIET: Nothing to eat or drink after midnight except a sip of water with medications (see medication instructions below)  FYI: For your safety, and to allow [place] to monitor your vital signs accurately during the surgery/procedure we request that   if you have artificial nails, gel coating, SNS etc. Please have those removed prior to your surgery/procedure. Not having the nail coverings /polish removed may result in cancellation or delay of your surgery/procedure.   Medication  Instructions: Hold Lasix the morning of procedure   Continue your anticoagulant: Eliquis  You will need to continue your anticoagulant after your procedure until you are told by your provider that it is safe to stop    Bring your insurance cards.  *Special Note: Every effort is made to have your procedure done on time. Occasionally there are emergencies that occur at the hospital that may cause delays. Please be patient if a delay does occur.

## 2021-07-02 NOTE — Addendum Note (Signed)
Addended by: Eileen Stanford on: 07/02/2021 07:15 PM   Modules accepted: Level of Service

## 2021-07-03 LAB — CBC
Hematocrit: 35 % — ABNORMAL LOW (ref 37.5–51.0)
Hemoglobin: 11 g/dL — ABNORMAL LOW (ref 13.0–17.7)
MCH: 25.5 pg — ABNORMAL LOW (ref 26.6–33.0)
MCHC: 31.4 g/dL — ABNORMAL LOW (ref 31.5–35.7)
MCV: 81 fL (ref 79–97)
Platelets: 266 10*3/uL (ref 150–450)
RBC: 4.31 x10E6/uL (ref 4.14–5.80)
RDW: 15.3 % (ref 11.6–15.4)
WBC: 7.3 10*3/uL (ref 3.4–10.8)

## 2021-07-03 LAB — BASIC METABOLIC PANEL
BUN/Creatinine Ratio: 21 (ref 10–24)
BUN: 26 mg/dL (ref 8–27)
CO2: 21 mmol/L (ref 20–29)
Calcium: 9 mg/dL (ref 8.6–10.2)
Chloride: 103 mmol/L (ref 96–106)
Creatinine, Ser: 1.22 mg/dL (ref 0.76–1.27)
Glucose: 76 mg/dL (ref 70–99)
Potassium: 4.7 mmol/L (ref 3.5–5.2)
Sodium: 139 mmol/L (ref 134–144)
eGFR: 63 mL/min/{1.73_m2} (ref >59)

## 2021-07-07 ENCOUNTER — Encounter (HOSPITAL_COMMUNITY): Payer: Self-pay | Admitting: Cardiology

## 2021-07-14 ENCOUNTER — Ambulatory Visit (HOSPITAL_COMMUNITY): Payer: Medicare PPO | Attending: Cardiovascular Disease

## 2021-07-14 ENCOUNTER — Other Ambulatory Visit: Payer: Self-pay

## 2021-07-14 DIAGNOSIS — I350 Nonrheumatic aortic (valve) stenosis: Secondary | ICD-10-CM | POA: Diagnosis not present

## 2021-07-14 DIAGNOSIS — Z952 Presence of prosthetic heart valve: Secondary | ICD-10-CM | POA: Insufficient documentation

## 2021-07-14 LAB — ECHOCARDIOGRAM COMPLETE
AR max vel: 1.48 cm2
AV Area VTI: 1.65 cm2
AV Area mean vel: 1.64 cm2
AV Mean grad: 8.2 mmHg
AV Peak grad: 18.1 mmHg
Ao pk vel: 2.13 m/s
Area-P 1/2: 3.26 cm2
P 1/2 time: 460 msec
S' Lateral: 3.8 cm

## 2021-07-16 ENCOUNTER — Other Ambulatory Visit: Payer: Self-pay

## 2021-07-16 ENCOUNTER — Ambulatory Visit (HOSPITAL_COMMUNITY): Payer: Medicare PPO | Admitting: Anesthesiology

## 2021-07-16 ENCOUNTER — Encounter (HOSPITAL_COMMUNITY): Payer: Self-pay | Admitting: Cardiology

## 2021-07-16 ENCOUNTER — Ambulatory Visit (HOSPITAL_COMMUNITY)
Admission: RE | Admit: 2021-07-16 | Discharge: 2021-07-16 | Disposition: A | Discharge status: Home / Self Care | Payer: Medicare PPO | Source: Ambulatory Visit | Attending: Cardiology | Admitting: Cardiology

## 2021-07-16 ENCOUNTER — Encounter (HOSPITAL_COMMUNITY)
Admission: RE | Disposition: A | Discharge status: Home / Self Care | Payer: Self-pay | Source: Ambulatory Visit | Attending: Cardiology

## 2021-07-16 DIAGNOSIS — G4733 Obstructive sleep apnea (adult) (pediatric): Secondary | ICD-10-CM | POA: Insufficient documentation

## 2021-07-16 DIAGNOSIS — I080 Rheumatic disorders of both mitral and aortic valves: Secondary | ICD-10-CM | POA: Diagnosis not present

## 2021-07-16 DIAGNOSIS — E785 Hyperlipidemia, unspecified: Secondary | ICD-10-CM | POA: Diagnosis not present

## 2021-07-16 DIAGNOSIS — F418 Other specified anxiety disorders: Secondary | ICD-10-CM | POA: Insufficient documentation

## 2021-07-16 DIAGNOSIS — N183 Chronic kidney disease, stage 3 unspecified: Secondary | ICD-10-CM | POA: Diagnosis not present

## 2021-07-16 DIAGNOSIS — E1151 Type 2 diabetes mellitus with diabetic peripheral angiopathy without gangrene: Secondary | ICD-10-CM | POA: Insufficient documentation

## 2021-07-16 DIAGNOSIS — I872 Venous insufficiency (chronic) (peripheral): Secondary | ICD-10-CM | POA: Diagnosis not present

## 2021-07-16 DIAGNOSIS — F17210 Nicotine dependence, cigarettes, uncomplicated: Secondary | ICD-10-CM | POA: Diagnosis not present

## 2021-07-16 DIAGNOSIS — J449 Chronic obstructive pulmonary disease, unspecified: Secondary | ICD-10-CM | POA: Insufficient documentation

## 2021-07-16 DIAGNOSIS — M1990 Unspecified osteoarthritis, unspecified site: Secondary | ICD-10-CM | POA: Diagnosis not present

## 2021-07-16 DIAGNOSIS — M179 Osteoarthritis of knee, unspecified: Secondary | ICD-10-CM | POA: Insufficient documentation

## 2021-07-16 DIAGNOSIS — I878 Other specified disorders of veins: Secondary | ICD-10-CM | POA: Insufficient documentation

## 2021-07-16 DIAGNOSIS — I2510 Atherosclerotic heart disease of native coronary artery without angina pectoris: Secondary | ICD-10-CM | POA: Insufficient documentation

## 2021-07-16 DIAGNOSIS — K219 Gastro-esophageal reflux disease without esophagitis: Secondary | ICD-10-CM | POA: Diagnosis not present

## 2021-07-16 DIAGNOSIS — I4891 Unspecified atrial fibrillation: Secondary | ICD-10-CM | POA: Diagnosis not present

## 2021-07-16 DIAGNOSIS — Z79899 Other long term (current) drug therapy: Secondary | ICD-10-CM | POA: Insufficient documentation

## 2021-07-16 DIAGNOSIS — I130 Hypertensive heart and chronic kidney disease with heart failure and stage 1 through stage 4 chronic kidney disease, or unspecified chronic kidney disease: Secondary | ICD-10-CM | POA: Insufficient documentation

## 2021-07-16 DIAGNOSIS — Z7901 Long term (current) use of anticoagulants: Secondary | ICD-10-CM | POA: Diagnosis not present

## 2021-07-16 DIAGNOSIS — E1122 Type 2 diabetes mellitus with diabetic chronic kidney disease: Secondary | ICD-10-CM | POA: Diagnosis not present

## 2021-07-16 DIAGNOSIS — I5032 Chronic diastolic (congestive) heart failure: Secondary | ICD-10-CM | POA: Insufficient documentation

## 2021-07-16 DIAGNOSIS — D649 Anemia, unspecified: Secondary | ICD-10-CM | POA: Diagnosis not present

## 2021-07-16 DIAGNOSIS — I4819 Other persistent atrial fibrillation: Secondary | ICD-10-CM | POA: Insufficient documentation

## 2021-07-16 HISTORY — PX: CARDIOVERSION: SHX1299

## 2021-07-16 SURGERY — CARDIOVERSION
Anesthesia: General

## 2021-07-16 MED ORDER — SODIUM CHLORIDE 0.9 % IV SOLN: INTRAVENOUS | Status: DC

## 2021-07-16 MED ORDER — SODIUM CHLORIDE 0.9 % IV SOLN: INTRAVENOUS | Status: DC | PRN

## 2021-07-16 MED ORDER — LIDOCAINE HCL (PF) 2 % IJ SOLN
INTRAMUSCULAR | Status: DC | PRN
  Administered 2021-07-16: 60 mg

## 2021-07-16 MED ORDER — ALBUMIN HUMAN 5 % IV SOLN
INTRAVENOUS | Status: AC
  Filled 2021-07-16: qty 250

## 2021-07-16 MED ORDER — PROPOFOL 10 MG/ML IV BOLUS
INTRAVENOUS | Status: DC | PRN
  Administered 2021-07-16: 50 mg via INTRAVENOUS

## 2021-07-16 MED ORDER — ALBUMIN HUMAN 5 % IV SOLN
12.5000 g | Freq: Once | INTRAVENOUS | Status: AC
  Administered 2021-07-16: 12.5 g via INTRAVENOUS

## 2021-07-16 NOTE — CV Procedure (Signed)
° °  Electrical Cardioversion Procedure Note Jordan [name] [id number] [birth year]  Procedure: Electrical Cardioversion Indications:  Atrial Fibrillation  Time Out: Verified patient identification, verified procedure,medications/allergies/relevent history reviewed, required imaging and test results available.  Performed  Procedure Details  The patient signed informed consent.   The patient was NPO past midnight. Has had therapeutic anticoagulation with Eliquis  greater than 3 weeks. The patient denies any interruption of anticoagulation.  Anesthesia was administered by the anesthesiology team.  Adequate airway was maintained throughout and vital followed per protocol.  He was cardioverted x 1 with 200J of biphasic synchronized energy.  He converted to NSR.  There were no apparent complications.  The patient tolerated the procedure well and had normal neuro status and respiratory status post procedure with vitals stable as recorded elsewhere.     IMPRESSION:  Successful cardioversion of atrial fibrillation   Follow up:  We will arrange follow up with Primary cardiologist.  He will continue on current medical therapy.  The patient advised to continue anticoagulation.  Jordan [name] 07/16/2021, 11:38 AM

## 2021-07-16 NOTE — Anesthesia Preprocedure Evaluation (Addendum)
Anesthesia Evaluation  Patient identified by MRN, date of birth, ID band Patient awake    Reviewed: Allergy & Precautions, H&P , NPO status , Patient's Chart, lab work & pertinent test results  Airway Mallampati: III  TM Distance: >3 FB Neck ROM: Full    Dental no notable dental hx. (+) Teeth Intact, Dental Advisory Given   Pulmonary sleep apnea , COPD,  COPD inhaler, Current Smoker and Patient abstained from smoking.,    Pulmonary exam normal breath sounds clear to auscultation       Cardiovascular hypertension, Pt. on medications + CAD, + Peripheral Vascular Disease and +CHF   Rhythm:Irregular Rate:Normal     Neuro/Psych  Headaches, Anxiety Depression    GI/Hepatic Neg liver ROS, GERD  ,  Endo/Other  diabetes  Renal/GU negative Renal ROS  negative genitourinary   Musculoskeletal  (+) Arthritis , Osteoarthritis,    Abdominal   Peds  Hematology  (+) Blood dyscrasia, anemia ,   Anesthesia Other Findings   Reproductive/Obstetrics negative OB ROS                            Anesthesia Physical Anesthesia Plan  ASA: 3  Anesthesia Plan: General   Post-op Pain Management: Minimal or no pain anticipated   Induction: Intravenous  PONV Risk Score and Plan: 1 and Propofol infusion  Airway Management Planned: Mask and Natural Airway  Additional Equipment:   Intra-op Plan:   Post-operative Plan:   Informed Consent: I have reviewed the patients History and Physical, chart, labs and discussed the procedure including the risks, benefits and alternatives for the proposed anesthesia with the patient or authorized representative who has indicated his/her understanding and acceptance.     Dental advisory given  Plan Discussed with: CRNA  Anesthesia Plan Comments:         Anesthesia Quick Evaluation

## 2021-07-16 NOTE — Anesthesia Postprocedure Evaluation (Signed)
Anesthesia Post Note  Patient: Jordan [name]  Procedure(s) Performed: CARDIOVERSION     Patient location during evaluation: Endoscopy Anesthesia Type: General Level of consciousness: awake and alert Pain management: pain level controlled Vital Signs Assessment: post-procedure vital signs reviewed and stable Respiratory status: spontaneous breathing, nonlabored ventilation and respiratory function stable Cardiovascular status: blood pressure returned to baseline and stable Postop Assessment: no apparent nausea or vomiting Anesthetic complications: no   No notable events documented.  Last Vitals:  Vitals:   07/16/21 1220 07/16/21 1230  BP: (!) 109/47 (!) 110/50  Pulse: 70 68  Resp: 13 12  Temp:    SpO2: 93% 100%    Last Pain:  Vitals:   07/16/21 1140  TempSrc: Temporal  PainSc: 0-No pain                 Karry Causer,W. EDMOND

## 2021-07-16 NOTE — Discharge Instructions (Signed)

## 2021-07-16 NOTE — Transfer of Care (Signed)
Immediate Anesthesia Transfer of Care Note  Patient: Jordan [name]  Procedure(s) Performed: CARDIOVERSION  Patient Location: Endoscopy Unit  Anesthesia Type:General  Level of Consciousness: awake, alert  and oriented  Airway & Oxygen Therapy: Patient Spontanous Breathing  Post-op Assessment: Report given to RN, Post -op Vital signs reviewed and stable and Patient moving all extremities X 4  Post vital signs: Reviewed and stable  Last Vitals:  Vitals Value Taken Time  BP    Temp    Pulse    Resp    SpO2      Last Pain:  Vitals:   07/16/21 1034  TempSrc: Temporal  PainSc: 0-No pain         Complications: No notable events documented.

## 2021-07-16 NOTE — Interval H&P Note (Signed)
History and Physical Interval Note:  07/16/2021 11:18 AM  Jordan [name]  has presented today for surgery, with the diagnosis of AFIB.  The various methods of treatment have been discussed with the patient and family. After consideration of risks, benefits and other options for treatment, the patient has consented to  Procedure(s): CARDIOVERSION (N/A) as a surgical intervention.  The patient's history has been reviewed, patient examined, no change in status, stable for surgery.  I have reviewed the patient's chart and labs.  Questions were answered to the patient's satisfaction.     Sairah Knobloch

## 2021-07-17 ENCOUNTER — Encounter (HOSPITAL_COMMUNITY): Payer: Self-pay | Admitting: Cardiology

## 2021-07-20 ENCOUNTER — Other Ambulatory Visit: Payer: Self-pay | Admitting: Physician Assistant

## 2021-07-20 NOTE — Telephone Encounter (Signed)
Pt's pharmacy is requesting a refill on nystatin cream. Would Jordan [name], like to refill this medication? Please address

## 2021-08-30 ENCOUNTER — Ambulatory Visit: Payer: Medicare PPO | Admitting: Physician Assistant

## 2021-08-30 DIAGNOSIS — N183 Chronic kidney disease, stage 3 unspecified: Secondary | ICD-10-CM | POA: Insufficient documentation

## 2021-08-30 DIAGNOSIS — I700 Atherosclerosis of aorta: Secondary | ICD-10-CM | POA: Insufficient documentation

## 2021-08-30 NOTE — Progress Notes (Unsigned)
Cardiology Office Note:    Date:  08/30/2021   ID:  [name], DOB Oct 10, 1950, MRN [id number]  PCP:  [name], MD  [place] Cardiologist:  Candee Furbish, MD Structural Heart:  Sherren Mocha, MD{ Click to update primary MD,subspecialty MD or APP then REFRESH:1}  *** Referring MD: [name], MD   Chief Complaint:  No chief complaint on file. {Click here for Visit Info    :1}   Patient Profile: Chronic diastolic CHF Echo 8/10: EF 55-60 Coronary artery disease Moderate nonobstructive by cath 9/21 Aortic valve disease Severe aortic insufficiency, moderate to severe aortic stenosis S/p TAVR in 1/22 Echo 2/22: EF 60-65, mild to moderate paravalvular leak Persistent Atrial fibrillation  S/p DCCV 1/23 COPD Hypertension Hyperlipidemia Diabetes mellitus Tobacco abuse Aortic atherosclerosis  OSA GERD Venous insufficiency Chronic normocytic anemia   Prior CV studies: Echo 07/14/2021  EF 55-60, no RWMA, s/p TAVR with mild PVL (mean gradient 8.2 mmHg), normal RVSF, severe LAE, mild RAE, trivial MR  Echocardiogram 07/23/20 EF 60-65, no RWMA, mild LVH, GRII DD, normal RVSF, moderate BAE, trivial MR, s/p TAVR with mild to moderate paravalvular leak, mean AV gradient 14.6   Carotid US 03/07/2020 Minimal bilateral plaque without significant ICA stenosis   Cardiac catheterization 03/06/2020 LAD moderate calcified LCx proximal 50 RCA mid 30, distal 30 Mean aortic valve gradient 26 mmHg, calculated AVA 1.6 cm Moderate pulmonary hypertension, mean PA pressure 34 mmHg *** {Select studies to display:26339}   History of Present Illness:   Jordan [name] is a 71 y.o. male with the above problem list.  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  He underwent TAVR in 1/22.  He developed AFib in 7/22 and was admitted with cellulitis, AF w RVR  and decompensated HF.  He was readmitted in 9/22 with decompensated HF.  He required transfusion with PRBCs due to worsening anemia.  He was followed by orthopedics for his leg edema which improved significantly.   He was last seen in 1/23 by Angelena Form, PA-C with the structural heart program.  Katie set him up for a DCCV which was performed on 07/16/21.   He returns for f/u.  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 Burt Knack and Dr. Cyndia Bent   Severe aortic stenosis    Severe aortic insufficiency, moderate to severe aortic stenosis   Sleep apnea    CPAP    Venous insufficiency    managed by ortho (Dr. Sharol Given)   Current Medications: No outpatient medications have been marked as taking for the 08/30/21 encounter (Appointment) with [name] T, PA-C.    Allergies:   Altace [ramipril], Codeine, and Naproxen   Social History   Tobacco Use   Smoking status:  Some Days    Packs/day: 0.75    Years: 55.00    Pack years: 41.25    Types: Cigarettes   Smokeless tobacco: Never  Vaping Use   Vaping Use: Never used  Substance Use Topics   Alcohol use: No    Alcohol/week: 0.0 standard drinks   Drug use: No    Family Hx: The patient's family history includes Alzheimer's disease in his mother; Congestive Heart Failure in his father; Diabetes in an other family member; Heart attack in his father; Hypertension in his mother; Stroke in his father. There is no history of Colon cancer, Rectal  cancer, or Esophageal cancer.  ROS   EKGs/Labs/Other Test Reviewed:    EKG:  EKG is *** ordered today.  The ekg ordered today demonstrates ***  Recent Labs: 12/31/2020: ALT 17 02/23/2021: B Natriuretic Peptide 932.6 02/24/2021: Magnesium 2.1 07/02/2021: BUN 26; Creatinine, Ser 1.22; Hemoglobin 11.0; Platelets 266; Potassium 4.7; Sodium 139   Recent Lipid Panel No results for input(s): CHOL, TRIG, HDL, VLDL, LDLCALC, LDLDIRECT in the last 8760 hours.   Risk Assessment/Calculations:   {Does this patient have ATRIAL FIBRILLATION?:[phone]}     Physical Exam:    VS:  There were no vitals taken for this visit.    Wt Readings from Last 3 Encounters:  07/16/21 170 lb (77.1 kg)  07/02/21 183 lb (83 kg)  03/16/21 158 lb 9.6 oz (71.9 kg)    Physical Exam ***     ASSESSMENT & PLAN:   No problem-specific Assessment & Plan notes found for this encounter. *** 1. Persistent atrial fibrillation ([place]) He remains in atrial fibrillation.  His heart rate is well controlled at this time.  He seems to be tolerating anticoagulation.  Continue apixaban 5 mg twice daily.  Continue metoprolol tartrate 75 mg twice daily.  We could consider proceeding with cardioversion to try to restore normal sinus rhythm.  However, given his history of anemia and recent need for blood transfusion, I hesitate to proceed at this time.  I think it would be better to wait to see how stable his anemia remains.  If he remains stable without having to interrupt anticoagulation again, we can certainly consider this.  Follow-up in 6 weeks.   2. Chronic heart failure with preserved ejection fraction ([place]) Echo 2/22 with EF 60-65.  He has been admitted twice with decompensated heart failure.  Overall, his volume is stable.  He has lost a significant amount of weight since his initial admission to the hospital.  His blood pressure is running somewhat low.  Repeat by me is 102/52.  Overall, he is asymptomatic with his low blood  pressure.  In any event, I will decrease his furosemide to 40 mg every other day alternating with 20 mg every other day.  He had labs recently with primary care which included a BMET.  I will request those for our records today.  He knows to weigh himself daily and to call if his weight increases by 3 pounds or more in 1 day.   3. S/P TAVR (transcatheter aortic valve replacement) He is now off of aspirin and Plavix.  He was taken off of Plavix when he was placed on Apixaban.  His aspirin was stopped when he developed worsening anemia in the hospital.  It is greater than 6 months since his TAVR.  I think it should be okay for him to remain on Apixaban alone.  I will review further with Dr. Burt Knack.  He had mild to  moderate paravalvular leak by last echocardiogram.  He has follow-up pending in February 2023 with structural heart.  He will have a repeat echocardiogram at that time.  Continue SBE prophylaxis. Addendum: I reviewed with Dr. Burt Knack.  It is ok to remain off of ASA and stay on Eliquis only.   4. Stage 3 chronic kidney disease, unspecified whether stage 3a or 3b CKD ([place]) As noted, repeat BMET will be obtained from primary care.   5. Coronary artery disease involving native coronary artery of native heart without angina pectoris Nonobstructive disease by cardiac catheterization in 9/21.  He is not having anginal symptoms.  Continue atorvastatin 80 mg daily.   6. Essential hypertension As noted, blood pressure is low.  I will reduce his furosemide as outlined above.   7. Mixed hyperlipidemia Continue atorvastatin 80 mg daily.  LDL in 9/21 was 64.   8. Normocytic anemia He has chronic anemia.  He required blood transfusion in the hospital.  He had a follow-up CBC with primary care recently.  I will request those labs for our records.  As noted above, if his anemia remained stable over time while on Apixaban, we can certainly consider cardioversion to restore normal sinus rhythm. Addendum:  Labs from PCP received and personally reviewed.  03/12/2021: Hgb 9.7, MCV 88.9, platelet count 228K, creatinine 1.0, K+ 4.5      {Are you ordering a CV Procedure (e.g. stress test, cath, DCCV, TEE, etc)?   Press F2        :[id number]}  Dispo:  No follow-ups on file.   Medication Adjustments/Labs and Tests Ordered: Current medicines are reviewed at length with the patient today.  Concerns regarding medicines are outlined above.  Tests Ordered: No orders of the defined types were placed in this encounter.  Medication Changes: No orders of the defined types were placed in this encounter.  Signed, [name], PA-C  08/30/2021 9:33 AM    [facility] [place], [place], [place]  [address] Phone: [phone]; Fax: [phone]

## 2021-10-18 ENCOUNTER — Telehealth: Payer: Self-pay | Admitting: *Deleted

## 2021-10-18 NOTE — Telephone Encounter (Signed)
Patient with diagnosis of afib on Eliquis for anticoagulation.   ? ?Procedure: right TKA ?Date of procedure: 02/07/22 ? ?CHA2DS2-VASc Score = 7  ?This indicates a 11.2% annual risk of stroke. ?The patient's score is based upon: ?CHF History: 1 ?HTN History: 1 ?Diabetes History: 1 ?Stroke History: 2 ?Vascular Disease History: 1 ?Age Score: 1 ?Gender Score: 0 ?  ?CrCl 50m/min ?Platelet count 266K ? ?Typically require 3 day Eliquis hold prior to TKA procedure. Pt is at elevated CV risk off of anticoagulation particularly given hx of afib and prior CVA. MD will need to provide final clearance rec, however it looks like pt has never been evaluated by a cardiologist, has followed with APPs since 2021. ? ?

## 2021-10-18 NOTE — Telephone Encounter (Signed)
? ?  Pre-operative Risk Assessment  ?  ?Patient Name: Jordan [name]  ?DOB: February 06, 1951 ?MRN: [id number]  ? ?  ? ?Request for Surgical Clearance   ? ?Procedure:   RIGHT TOTAL KNEE ARTHROPLASTY ? ?Date of Surgery:  Clearance 02/07/22                              ?   ?Surgeon:  DR. Gaynelle Arabian ?Surgeon's Group or Practice Name:  [facility] ?Phone number:  [phone] ?Fax number:  [phone] ATTN: [place] Chard ?  ?Type of Clearance Requested:   ?- Medical  ?- Pharmacy:  Hold Apixaban (Eliquis)   ?  ?Type of Anesthesia:   CHOICE ?  ?Additional requests/questions:   ? ?Signed, ?Julaine Hua   ?10/18/2021, 1:37 PM  ? ?

## 2021-10-18 NOTE — Telephone Encounter (Signed)
Routing to pharm for recommendations and patient will need  tele visit  for medical clearance last visit was 07/02/21 ?

## 2021-10-18 NOTE — Telephone Encounter (Signed)
? ?  Name: Jordan [name]  ?DOB: [birth year]  ?MRN: [id number] ? ?Primary Cardiologist: Candee Furbish, MD ? ?Chart reviewed as part of pre-operative protocol coverage. Because of [name]'s past medical history and time since last visit, he will require a follow-up in-office visit in order to better assess preoperative cardiovascular risk.  ? ?Last OV was in 06/2021 and he was set up for cardioversion at that time. He underwent DCCV 07/16/21. However, he has not followed up since this visit. He cancelled his 08/2021 visit. Recommend f/u OV for general follow-up and surgical risk assessment. ? ?Pre-op covering staff: ?- Please schedule appointment and call patient to inform them.  ?- Please contact requesting surgeon's office via preferred method (i.e, phone, fax) to inform them of need for appointment prior to surgery. ? ?As below, pharmacy team weighed in preliminarily on anticoagulation but needs MD input therefore will route to Dr. Marlou Porch (he had not seen in office recently but did see in hospital last year). Will cc back to pharm so they are aware that MD recommendation forthcoming. ? ?[name], PA-C  ?10/18/2021, 3:51 PM  ? ?

## 2021-10-19 NOTE — Telephone Encounter (Signed)
Pt has been scheduled for in office appt with [name], [facility] 11/03/21, pt preferred this appt date. Will send FYI to requesting office pt has appt 11/03/21. Will send notes to [facility] for upcoming appt.  ?

## 2021-11-03 ENCOUNTER — Ambulatory Visit: Payer: Medicare PPO | Admitting: Physician Assistant

## 2021-11-03 ENCOUNTER — Encounter: Payer: Self-pay | Admitting: Physician Assistant

## 2021-11-03 VITALS — BP 140/70 | HR 38 | Ht 72.0 in | Wt 186.0 lb

## 2021-11-03 DIAGNOSIS — R001 Bradycardia, unspecified: Secondary | ICD-10-CM

## 2021-11-03 DIAGNOSIS — I350 Nonrheumatic aortic (valve) stenosis: Secondary | ICD-10-CM

## 2021-11-03 DIAGNOSIS — N183 Chronic kidney disease, stage 3 unspecified: Secondary | ICD-10-CM | POA: Diagnosis not present

## 2021-11-03 DIAGNOSIS — Z01810 Encounter for preprocedural cardiovascular examination: Secondary | ICD-10-CM | POA: Diagnosis not present

## 2021-11-03 DIAGNOSIS — I10 Essential (primary) hypertension: Secondary | ICD-10-CM | POA: Diagnosis not present

## 2021-11-03 DIAGNOSIS — E782 Mixed hyperlipidemia: Secondary | ICD-10-CM | POA: Diagnosis not present

## 2021-11-03 DIAGNOSIS — I5032 Chronic diastolic (congestive) heart failure: Secondary | ICD-10-CM | POA: Diagnosis not present

## 2021-11-03 DIAGNOSIS — I2510 Atherosclerotic heart disease of native coronary artery without angina pectoris: Secondary | ICD-10-CM | POA: Diagnosis not present

## 2021-11-03 DIAGNOSIS — I4819 Other persistent atrial fibrillation: Secondary | ICD-10-CM

## 2021-11-03 MED ORDER — METOPROLOL TARTRATE 75 MG PO TABS: 37.5000 mg | ORAL_TABLET | Freq: Two times a day (BID) | ORAL | 3 refills | Status: DC

## 2021-11-03 NOTE — Assessment & Plan Note (Signed)
Normally functioning TAVR by echocardiogram in January 2023.  He does have mild paravalvular leak.  Continue follow-up with structural heart as planned.  Continue SBE prophylaxis. ?

## 2021-11-03 NOTE — Assessment & Plan Note (Addendum)
Jordan [name] perioperative risk of a major cardiac event is 6.6% according to the Revised Cardiac Risk Index (RCRI).  Therefore, he is at high risk for perioperative complications.   His functional capacity is poor at 3.3 METs according to the Duke Activity Status Index (DASI). ?Recommendations: ?According to ACC/AHA guidelines, the patient will require stress testing for further risk stratification. ?Further recommendations will follow once his stress test is completed. ?Antiplatelet and/or Anticoagulation Recommendations: ?Case was reviewed with Dr. Marlou Porch.  Eliquis (Apixaban) can be held for 3 days prior to surgery.  Please resume post op when felt to be safe.   ?

## 2021-11-03 NOTE — Assessment & Plan Note (Signed)
He has profound sinus bradycardia on electrocardiogram today.  He has not symptomatic.  I will reduce his metoprolol tartrate to 37.5 mg twice daily.  Arrange follow-up nurse visit in 1 to 2 weeks for EKG and blood pressure check. ?

## 2021-11-03 NOTE — Progress Notes (Addendum)
Cardiology Office Note:    Date:  11/03/2021   ID:  [name], DOB 26-Aug-1950, MRN [id number]  PCP:  [name], MD  [facility] Providers Cardiologist:  Candee Furbish, MD Structural Heart:  Sherren Mocha, MD   Referring MD: [name], MD   Chief Complaint:  Surgical clearance    Patient Profile: (HFpEF) heart failure with preserved ejection fraction  Echo 9/21: EF 55-60 Admx July 22 w a/c CHF and AF w RVR in setting of L leg cellulitis Admx Aug 22 w a/c CHF c/b AF w RVR  Coronary artery disease Moderate nonobstructive by cath 9/21 Aortic valve disease Severe aortic insufficiency, moderate to severe aortic stenosis S/p TAVR in 1/22 Echo 2/22: EF 60-65, mild to moderate paravalvular leak Persistent Atrial fibrillation  COPD Hypertension Hyperlipidemia Diabetes mellitus Tobacco abuse Aortic atherosclerosis  OSA GERD Venous insufficiency Chronic normocytic anemia Hx of CVA  Prior CV Studies: 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/14/2021 EF 55-60, s/p TAVR with mean gradient 8.2, mild AI, no RWMA, normal RVSF, severe LAE, mild RAE, trivial MR    Echocardiogram 07/23/20 EF 60-65, no RWMA, mild LVH, GRII DD, normal RVSF, moderate BAE, trivial MR, s/p TAVR with mild to moderate paravalvular leak, mean AV gradient 14.6   Carotid US 03/07/2020 Minimal bilateral plaque without significant ICA stenosis   Cardiac catheterization 03/06/2020 LAD moderate calcified LCx proximal 50 RCA mid 30, distal 30 Mean aortic valve gradient 26 mmHg, calculated AVA 1.6 cm Moderate pulmonary hypertension, mean PA pressure 34 mmHg  History of Present Illness:   Jordan [name] is a 71 y.o. male with the above problem list.  He was last seen in Jan 2023 by Angelena Form, PA-C in the structural heart clinic.  He was still in atrial fibrillation and his anemia was stable.  He was therefore set up for DCCV with restoration of normal sinus rhythm.     He returns for  surgical clearance.  He needs a R TKR with Dr. Wynelle Link on 02/07/22.  Type of anesthesia is not known at this time.  He will need to hold his Eliquis.     He is here alone.  Overall, he has been doing well without chest pain or shortness of breath.  He did have 2 episodes of jaw pain in the past couple of weeks.  This is unusual for him.  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  He uses a walker.  He has not had orthopnea.  His lower extremity edema is stable.  He has not had syncope, near syncope or dizziness.    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 Burt Knack and Dr. Cyndia Bent   Severe aortic stenosis    Severe aortic insufficiency, moderate to severe aortic stenosis   Sleep apnea    CPAP    Venous insufficiency    managed by ortho (  Dr. Sharol Given)   Current Medications: Current Meds  Medication Sig   ACETAMINOPHEN-BUTALBITAL 50-325 MG TABS butalbital 50 mg-acetaminophen 325 mg tablet  TAKE 1 TABLET BY MOUTH TWICE A DAY AS NEEDED FOR MIGRAINE   apixaban (ELIQUIS) 5 MG TABS tablet Take 1 tablet (5 mg total) by mouth 2 (two) times daily.   atorvastatin (LIPITOR) 80 MG tablet Take 80 mg by mouth daily after supper.    bismuth subsalicylate (PEPTO BISMOL) 262 MG/15ML suspension Take 30 mLs by mouth every 6 (six) hours as needed for indigestion.   budesonide-formoterol (SYMBICORT) 160-4.5 MCG/ACT inhaler Inhale 2 puffs into the lungs in the morning and at bedtime.    butalbital-acetaminophen-caffeine (FIORICET, ESGIC) 50-325-40 MG per tablet Take 1 tablet by mouth 2 (two) times daily as needed for migraine.    carboxymethylcellulose (REFRESH PLUS) 0.5 % SOLN Place 1 drop into both eyes daily as needed (dry eyes).   doxepin (SINEQUAN) 50 MG capsule Take by mouth as needed.   folic acid (FOLVITE) 1 MG tablet Take 1 tablet (1 mg total) by mouth daily.   furosemide (LASIX) 40 MG tablet Take 1 tablet by mouth every other day alternating with 1/2 tablet every other day   ipratropium-albuterol (DUONEB) 0.5-2.5 (3) MG/3ML SOLN Take 3 mLs by nebulization every 6 (six) hours as needed (shortness of breath/wheezing).   LORazepam (ATIVAN) 1 MG tablet Take 1 mg by mouth 3 (three) times daily.   methocarbamol (ROBAXIN) 750 MG tablet Take 750 mg by mouth as needed for muscle spasms.   montelukast (SINGULAIR) 10 MG tablet Take 10 mg by mouth as needed (for allergies).   Multiple Vitamin (MULTIVITAMIN WITH MINERALS) TABS Take 1 tablet by mouth daily.   nystatin cream (MYCOSTATIN) APPLY TO AFFECTED AREA TWICE A DAY   pantoprazole (PROTONIX) 40 MG tablet Take 1 tablet (40 mg total) by mouth daily.   promethazine (PHENERGAN) 25 MG tablet Take 25 mg by mouth every 8 (eight) hours as needed for nausea/vomiting.   venlafaxine XR (EFFEXOR-XR) 75 MG 24 hr capsule Take 75 mg by mouth in the morning and at bedtime. MUST HAVE BRAND NAME ONLY   [DISCONTINUED] Metoprolol Tartrate 75 MG TABS Take 75 mg by mouth 2 (two) times daily.    Allergies:   Altace [ramipril], Codeine, and Naproxen   Social History   Tobacco Use   Smoking status: Some Days    Packs/day: 0.75    Years: 55.00    Pack years: 41.25    Types: Cigarettes   Smokeless tobacco: Never  Vaping Use   Vaping Use: Never used  Substance Use Topics   Alcohol use: No    Alcohol/week: 0.0 standard drinks   Drug use: No    Family Hx: The patient's family history includes Alzheimer's disease in his mother; Congestive  Heart Failure in his father; Diabetes in an other family member; Heart attack in his father; Hypertension in his mother; Stroke in his father. There is no history of Colon cancer, Rectal cancer, or Esophageal cancer.  Review of Systems  Gastrointestinal:  Negative for hematochezia and melena.  Genitourinary:  Negative for hematuria.    EKGs/Labs/Other Test Reviewed:    EKG:  EKG is  ordered today.  The ekg ordered today demonstrates marked sinus bradycardia, HR 38, normal axis, LVH, nonspecific ST-T wave changes, QTc 356  Recent Labs: 12/31/2020: ALT 17 02/23/2021: B Natriuretic Peptide 932.6 02/24/2021: Magnesium 2.1 07/02/2021: BUN 26; Creatinine, Ser 1.22; Hemoglobin 11.0; Platelets 266; Potassium 4.7; Sodium 139  Recent Lipid Panel No results for input(s): CHOL, TRIG, HDL, VLDL, LDLCALC, LDLDIRECT in the last 8760 hours.   Risk Assessment/Calculations:    CHA2DS2-VASc Score = 7   This indicates a 11.2% annual risk of stroke. The patient's score is based upon: CHF History: 1 HTN History: 1 Diabetes History: 1 Stroke History: 2 Vascular Disease History: 1 Age Score: 1 Gender Score: 0        Physical Exam:    VS:  BP 140/70   Pulse (!) 38   Ht 6' (1.829 m)   Wt 186 lb (84.4 kg)   SpO2 95%   BMI 25.23 kg/m     Wt Readings from Last 3 Encounters:  11/03/21 186 lb (84.4 kg)  07/16/21 170 lb (77.1 kg)  07/02/21 183 lb (83 kg)    Constitutional:      Appearance: Healthy appearance. Not in distress.  Neck:     Vascular: No JVR.  Pulmonary:     Effort: Pulmonary effort is normal.     Breath sounds: No wheezing. No rales.  Cardiovascular:     Normal rate. Regular rhythm. Normal S1. Normal S2.      Murmurs: There is a grade 2/6 systolic murmur at the URSB.  Edema:    Peripheral edema present.    Pretibial: bilateral 1+ edema of the pretibial area. Abdominal:     Palpations: Abdomen is soft.  Skin:    General: Skin is warm and dry.  Neurological:     General: No  focal deficit present.     Mental Status: Alert and oriented to person, place and time.     Cranial Nerves: Cranial nerves are intact.        ASSESSMENT & PLAN:   Preoperative cardiovascular examination Mr. Amos perioperative risk of a major cardiac event is 6.6% according to the Revised Cardiac Risk Index (RCRI).  Therefore, he is at high risk for perioperative complications.   His functional capacity is poor at 3.3 METs according to the Duke Activity Status Index (DASI). Recommendations: According to ACC/AHA guidelines, the patient will require stress testing for further risk stratification. Further recommendations will follow once his stress test is completed. Antiplatelet and/or Anticoagulation Recommendations: Case was reviewed with Dr. Marlou Porch.  Eliquis (Apixaban) can be held for 3 days prior to surgery.  Please resume post op when felt to be safe.    CAD (coronary artery disease) Moderate nonobstructive CAD by cardiac catheterization in 2021.  He continues to smoke and he is a diabetic.  He has had some recent jaw pain but no exertional symptoms.  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  We are proceeding with stress testing as noted. Arrange cardiac PET He is not on aspirin as he is on Apixaban Continue atorvastatin 80 mg daily, beta-blocker  Chronic heart failure with preserved ejection fraction (HCC) EF 55-60 by echocardiogram in January 2023.  Volume status currently appears stable.  Continue furosemide 40 mg every other day alternating with 20 mg every other day.  Consider SGLT2 inhibitor or Entresto at follow-up appointment.  Essential hypertension Borderline control.  As noted, he will return in 1 to 2 weeks for an EKG.  We will get a blood pressure check at that time.  HLD (hyperlipidemia) Labs obtained through [facility] - personally reviewed and interpreted: 03/12/2021: Total cholesterol 153, HDL 44, LDL 97, triglycerides 59, ALT 10 LDL was optimal in 2021.   Continue atorvastatin 80 mg daily.  Plan follow-up CMET, lipids in  6 months.  If LDL remains above 70, consider addition of ezetimibe.   Persistent atrial fibrillation ([place]) Status post cardioversion in January 2023.  He is maintaining sinus rhythm.  Continue apixaban 5 mg twice daily (age <80, weight >60 kg, creatinine <1.5).  Severe aortic stenosis s/p TAVR in Jan 2022 Normally functioning TAVR by echocardiogram in January 2023.  He does have mild paravalvular leak.  Continue follow-up with structural heart as planned.  Continue SBE prophylaxis.  Stage 3 chronic kidney disease (HCC) Recent creatinine stable.  Sinus bradycardia He has profound sinus bradycardia on electrocardiogram today.  He has not symptomatic.  I will reduce his metoprolol tartrate to 37.5 mg twice daily.  Arrange follow-up nurse visit in 1 to 2 weeks for EKG and blood pressure check.       Shared Decision Making/Informed Consent The risks [chest pain, shortness of breath, cardiac arrhythmias, dizziness, blood pressure fluctuations, myocardial infarction, stroke/transient ischemic attack, nausea, vomiting, allergic reaction, radiation exposure, metallic taste sensation and life-threatening complications (estimated to be 1 in 10,000)], benefits (risk stratification, diagnosing coronary artery disease, treatment guidance) and alternatives of a cardiac PET stress test were discussed in detail with Mr. Moeller and he agrees to proceed.   ADDENDUM 12/15/21  Cardiac PET 11/2021:  EF 51, no ischemia or infarction, +coronary Ca2+; low risk Low risk stress test.  Patient may proceed with planned surgical procedure at acceptable risk. [name], PA-C 12/15/2021 12:59 PM   Dispo:  Return in about 6 months (around 05/06/2022) for Routine follow up in 6 months with [name], PA-C.   Medication Adjustments/Labs and Tests Ordered: Current medicines are reviewed at length with the patient today.  Concerns regarding medicines are  outlined above.  Tests Ordered: Orders Placed This Encounter  Procedures   NM PET CT CARDIAC PERFUSION MULTI W/ABSOLUTE BLOODFLOW   Comp Met (CMET)   Lipid Profile   Cardiac Stress Test: Informed Consent Details: Physician/Practitioner Attestation; Transcribe to consent form and obtain patient signature   EKG 12-Lead   Medication Changes: Meds ordered this encounter  Medications   Metoprolol Tartrate 75 MG TABS    Sig: Take 37.5 mg by mouth 2 (two) times daily.    Dispense:  180 tablet    Refill:  3   Signed, [name], PA-C  11/03/2021 4:58 PM    [facility] [address], [place], [place]  [address] Phone: [phone]; Fax: [phone]

## 2021-11-03 NOTE — Assessment & Plan Note (Signed)
EF 55-60 by echocardiogram in January 2023.  Volume status currently appears stable.  Continue furosemide 40 mg every other day alternating with 20 mg every other day.  Consider SGLT2 inhibitor or Entresto at follow-up appointment. ?

## 2021-11-03 NOTE — Assessment & Plan Note (Signed)
Recent creatinine stable. 

## 2021-11-03 NOTE — Assessment & Plan Note (Signed)
Moderate nonobstructive CAD by cardiac catheterization in 2021.  He continues to smoke and he is a diabetic.  He has had some recent jaw pain but no exertional symptoms.  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  We are proceeding with stress testing as noted. ?? Arrange cardiac PET ?? He is not on aspirin as he is on Apixaban ?? Continue atorvastatin 80 mg daily, beta-blocker ?

## 2021-11-03 NOTE — Assessment & Plan Note (Signed)
Borderline control.  As noted, he will return in 1 to 2 weeks for an EKG.  We will get a blood pressure check at that time. ?

## 2021-11-03 NOTE — Assessment & Plan Note (Signed)
Labs obtained through [facility] - personally reviewed and interpreted: ?03/12/2021: Total cholesterol 153, HDL 44, LDL 97, triglycerides 59, ALT 10 ?LDL was optimal in 2021.  Continue atorvastatin 80 mg daily.  Plan follow-up CMET, lipids in 6 months.  If LDL remains above 70, consider addition of ezetimibe.  ?

## 2021-11-03 NOTE — Assessment & Plan Note (Signed)
Status post cardioversion in January 2023.  He is maintaining sinus rhythm.  Continue apixaban 5 mg twice daily (age <80, weight >60 kg, creatinine <1.5). ?

## 2021-11-03 NOTE — Patient Instructions (Signed)
Medication Instructions:  ? ?DECREASE Metoprolol one and one half tablet ( 37.5 mg ) twice daily.  ? ?*If you need a refill on your cardiac medications before your next appointment, please call your pharmacy* ? ? ?Lab Work: ? ?Your physician recommends that you return for a FASTING lipid profile/cmet. You can come in on the day of your appointment anytime between 7:30-4:30 fasting from midnight the night before. Wednesday, October 25.  ? ? ? ?If you have labs (blood work) drawn today and your tests are completely normal, you will receive your results only by: ?MyChart Message (if you have MyChart) OR ?A paper copy in the mail ?If you have any lab test that is abnormal or we need to change your treatment, we will call you to review the results. ? ? ?Testing/Procedures: ? ?How to Prepare for Your Cardiac PET/CT Stress Test: ? ?1. Please do not take these medications before your test:  ? ?Metoprolol. ?Your remaining medications may be taken with water. ? ?2. Nothing to eat or drink, except water, 3 hours prior to arrival time.   ?NO caffeine/decaffeinated products, or chocolate 12 hours prior to arrival. ? ?3. NO perfume, cologne or lotion ? ?4. Total time is 1 to 2 hours; you may want to bring reading material for the waiting time. ? ?5. Please report to Admitting at the [facility] Entrance 60 minutes early for your test. ? [facility] ? [place], [place] [address] ? ?In preparation for your appointment, medication and supplies will be purchased.  Appointment availability is limited, so if you need to cancel or reschedule, please call the Radiology Department at [phone]  24 hours in advance to avoid a cancellation fee of $100.00 ? ?What to Expect After you Arrive: ? ?Once you arrive and check in for your appointment, you will be taken to a preparation room within the Radiology Department.  A technologist or Nurse will obtain your medical history, verify that you are correctly prepped for the  exam, and explain the procedure.  Afterwards,  an IV will be started in your arm and electrodes will be placed on your skin for EKG monitoring during the stress portion of the exam. Then you will be escorted to the PET/CT scanner.  There, staff will get you positioned on the scanner and obtain a blood pressure and EKG.  During the exam, you will continue to be connected to the EKG and blood pressure machines.  A small, safe amount of a radioactive tracer will be injected in your IV to obtain a series of pictures of your heart along with an injection of a stress agent.   ? ?After your Exam: ? ?It is recommended that you eat a meal and drink a caffeinated beverage to counter act any effects of the stress agent.  Drink plenty of fluids for the remainder of the day and urinate frequently for the first couple of hours after the exam.  Your doctor will inform you of your test results within 7-10 business days. ? ?For questions about your test or how to prepare for your test, please call: ?Marchia Bond, Cardiac Imaging Nurse Navigator  ?Gordy Clement, Cardiac Imaging Nurse Navigator ?Office: [phone]  ? ? ?Follow-Up: ?At [facility], you and your health needs are our priority.  As part of our continuing mission to provide you with exceptional heart care, we have created designated Provider Care Teams.  These Care Teams include your primary Cardiologist (physician) and Advanced Practice Providers (APPs -  Physician Assistants and Nurse Practitioners) who all work together to provide you with the care you need, when you need it. ? ?We recommend signing up for the patient portal called "MyChart".  Sign up information is provided on this After Visit Summary.  MyChart is used to connect with patients for Virtual Visits (Telemedicine).  Patients are able to view lab/test results, encounter notes, upcoming appointments, etc.  Non-urgent messages can be sent to your provider as well.   ?To learn more about what you can do  with MyChart, go to [email or web address].   ? ?Your next appointment:   ?6 month(s) ? ?The format for your next appointment:   ?In Person ? ?Provider:   ?[name], PA-C       ? ? ?Other Instructions ? ? ?Keep nurse visit in two weeks to get EKG and BP checked.  ? ?Important Information About Sugar ? ? ? ? ?  ?

## 2021-11-10 DIAGNOSIS — F410 Panic disorder [episodic paroxysmal anxiety] without agoraphobia: Secondary | ICD-10-CM | POA: Diagnosis not present

## 2021-11-10 DIAGNOSIS — F411 Generalized anxiety disorder: Secondary | ICD-10-CM | POA: Diagnosis not present

## 2021-11-10 DIAGNOSIS — F331 Major depressive disorder, recurrent, moderate: Secondary | ICD-10-CM | POA: Diagnosis not present

## 2021-11-17 ENCOUNTER — Ambulatory Visit: Payer: Medicare PPO

## 2021-11-17 VITALS — BP 136/70 | HR 51 | Ht 72.0 in | Wt 181.0 lb

## 2021-11-17 DIAGNOSIS — R001 Bradycardia, unspecified: Secondary | ICD-10-CM

## 2021-11-17 NOTE — Patient Instructions (Addendum)
Medication Instructions:  Your physician recommends that you continue on your current medications as directed. Please refer to the Current Medication list given to you today.  *If you need a refill on your cardiac medications before your next appointment, please call your pharmacy*  Lab Work: If you have labs (blood work) drawn today and your tests are completely normal, you will receive your results only by: [facility] (if you have MyChart) OR A paper copy in the mail If you have any lab test that is abnormal or we need to change your treatment, we will call you to review the results.  Testing/Procedures: Keep your already scheduled appointments for test.  Follow-Up: At [facility], you and your health needs are our priority.  As part of our continuing mission to provide you with exceptional heart care, we have created designated Provider Care Teams.  These Care Teams include your primary Cardiologist (physician) and Advanced Practice Providers (APPs -  Physician Assistants and Nurse Practitioners) who all work together to provide you with the care you need, when you need it.  We recommend signing up for the patient portal called "MyChart".  Sign up information is provided on this After Visit Summary.  MyChart is used to connect with patients for Virtual Visits (Telemedicine).  Patients are able to view lab/test results, encounter notes, upcoming appointments, etc.  Non-urgent messages can be sent to your provider as well.   To learn more about what you can do with MyChart, go to [email or web address].    Your next appointment:   As scheduled   The format for your next appointment:   In Person  Provider:   [name] PA  Important Information About Sugar

## 2021-11-17 NOTE — Progress Notes (Signed)
   Nurse Visit   Date of Encounter: 11/17/2021 ID: Jordan [name], DOB 1951/01/19, MRN [id number]  PCP:  [name], MD   [facility] Providers Cardiologist:  Candee Furbish, MD Structural Heart:  Sherren Mocha, MD     Visit Details   VS:  BP 136/70   Pulse (!) 51   Ht 6' (1.829 m)   Wt 181 lb (82.1 kg)   SpO2 97%   BMI 24.55 kg/m  , BMI Body mass index is 24.55 kg/m.  Wt Readings from Last 3 Encounters:  11/17/21 181 lb (82.1 kg)  11/03/21 186 lb (84.4 kg)  07/16/21 170 lb (77.1 kg)     Reason for visit: EKG and BP Performed today: Vitals, EKG, Provider consulted:Dr. Marlou Porch, and Education Changes (medications, testing, etc.) : No changes, EKG showed NS with HR 71, after talking about current events happening in the world. At the beginning of his visit his HR was 51. Length of Visit: 60 minutes    Medications Adjustments/Labs and Tests Ordered: Orders Placed This Encounter  Procedures   EKG 12-Lead   No orders of the defined types were placed in this encounter.    Signed, Michaelyn Barter, RN  11/17/2021 2:58 PM

## 2021-11-18 NOTE — Progress Notes (Signed)
Agree [name], PA-C    11/18/2021 12:56 PM

## 2021-11-23 ENCOUNTER — Ambulatory Visit: Payer: Medicare PPO | Admitting: Orthopedic Surgery

## 2021-11-23 DIAGNOSIS — I872 Venous insufficiency (chronic) (peripheral): Secondary | ICD-10-CM

## 2021-11-29 ENCOUNTER — Telehealth: Payer: Self-pay | Admitting: Orthopedic Surgery

## 2021-11-29 NOTE — Telephone Encounter (Signed)
Pt called and was wondering if we could send someone out for wound care instead of him coming in?   [phone] [phone]

## 2021-11-30 ENCOUNTER — Ambulatory Visit: Payer: Medicare PPO | Admitting: Orthopedic Surgery

## 2021-11-30 NOTE — Telephone Encounter (Signed)
SW pt, he cannot come in today due to knee pain. He will call his ride and see if he can come on Thursday afternoon.

## 2021-11-30 NOTE — Telephone Encounter (Signed)
Will hold this for pt's appt today and let Dr. Sharol Given do another exam on leg. He is currently in a dynaflex comp wrap.

## 2021-12-02 ENCOUNTER — Ambulatory Visit: Payer: Medicare PPO | Admitting: Orthopedic Surgery

## 2021-12-02 DIAGNOSIS — I872 Venous insufficiency (chronic) (peripheral): Secondary | ICD-10-CM | POA: Diagnosis not present

## 2021-12-05 ENCOUNTER — Encounter: Payer: Self-pay | Admitting: Orthopedic Surgery

## 2021-12-05 NOTE — Progress Notes (Signed)
Office Visit Note   Patient: Jordan [name]           Date of Birth: September 30, 1950           MRN: [id number] Visit Date: 12/02/2021              Requested by: [name], MD [address] [place],  [place] [address] PCP: [name], MD  Chief Complaint  Patient presents with   Left Leg - Follow-up   Right Leg - Follow-up      HPI: Patient is a 71 year old gentleman who presents in follow-up for bilateral lower extremity venous stasis insufficiency ulceration and swelling.  Patient states with the compression wraps the swelling is decreasing.  Assessment & Plan: Visit Diagnoses:  1. Venous stasis dermatitis of both lower extremities     Plan: We will plan to wrap the legs with a Dynaflex wrap.  Anticipate advancing to compression socks.  Follow-Up Instructions: Return in about 1 week (around 12/09/2021).   Ortho Exam  Patient is alert, oriented, no adenopathy, well-dressed, normal affect, normal respiratory effort. Examination patient has good dorsalis pedis pulse he has good wrinkling of the skin there is no cellulitis there is 100% beefy granulation tissue in the venous ulcer that is 10 mm in diameter.  Imaging: No results found. No images are attached to the encounter.  Labs: Lab Results  Component Value Date   HGBA1C 5.3 03/05/2020   HGBA1C 5.7 (H) 03/14/2017   HGBA1C 5.0 10/30/2014   ESRSEDRATE 28 (H) 10/30/2014   CRP 6.5 (H) 10/30/2014   REPTSTATUS 12/31/2020 FINAL 12/29/2020   CULT MULTIPLE SPECIES PRESENT, SUGGEST RECOLLECTION (A) 12/29/2020     Lab Results  Component Value Date   ALBUMIN 2.6 (L) 12/31/2020   ALBUMIN 2.8 (L) 12/30/2020   ALBUMIN 3.1 (L) 12/29/2020   PREALBUMIN 9.4 (L) 10/30/2014    Lab Results  Component Value Date   MG 2.1 02/24/2021   MG 1.9 01/01/2021   MG 2.0 12/30/2020   No results found for: "VD25OH"  Lab Results  Component Value Date   PREALBUMIN 9.4 (L) 10/30/2014      Latest Ref Rng & Units 07/02/2021     1:22 PM 02/27/2021    4:12 AM 02/26/2021    3:50 AM  CBC EXTENDED  WBC 3.4 - 10.8 x10E3/uL 7.3  7.3  7.2   RBC 4.14 - 5.80 x10E6/uL 4.31  2.81  3.01   Hemoglobin 13.0 - 17.7 g/dL 11.0  8.1  8.4   HCT 37.5 - 51.0 % 35.0  25.9  27.4   Platelets 150 - 450 x10E3/uL 266  258  260      There is no height or weight on file to calculate BMI.  Orders:  No orders of the defined types were placed in this encounter.  No orders of the defined types were placed in this encounter.    Procedures: No procedures performed  Clinical Data: No additional findings.  ROS:  All other systems negative, except as noted in the HPI. Review of Systems  Objective: Vital Signs: There were no vitals taken for this visit.  Specialty Comments:  No specialty comments available.  PMFS History: Patient Active Problem List   Diagnosis Date Noted   Preoperative cardiovascular examination 11/03/2021   Sinus bradycardia 11/03/2021   Stage 3 chronic kidney disease ([place]) 08/30/2021   Aortic atherosclerosis ([place]) 08/30/2021   Chronic respiratory failure with hypoxia (HCC) 02/23/2021   Anxiety    Persistent atrial  fibrillation ([place]) 01/03/2021   Open wound of left foot 12/30/2020   Macrocytic anemia 12/30/2020   Arthritis 12/30/2020   Chronic heart failure with preserved ejection fraction ([place]) 12/30/2020   Left leg cellulitis 12/29/2020   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   Diabetes mellitus without complication (HCC)    COPD (chronic obstructive pulmonary disease) (HCC)    CAD (coronary artery disease)    Severe aortic stenosis s/p TAVR in Jan 2022    GERD (gastroesophageal reflux disease)    Protein-calorie malnutrition, severe 03/05/2020   Tobacco abuse 03/03/2020   Pressure injury of skin 03/15/2017   Chronic venous insufficiency 04/28/2014   PAD (peripheral artery disease) ([place]) 01/21/2014   Varicose veins of lower extremities with other complications 25/85/2778   Hx  of TIA (transient ischemic attack) and stroke 12/31/2013   HLD (hyperlipidemia) 12/31/2013   Severe obesity (BMI >= 40) ([place]) 12/31/2013   Essential hypertension 09/13/2012   Edema 09/13/2012   Past Medical History:  Diagnosis Date   Anxiety    Arthritis    knee and neck   CAD (coronary artery disease)    cath 9/21: LAD calcified, pLCx 36, mRCA 63, dRCA 30   Chronic diastolic CHF    COPD (chronic obstructive pulmonary disease) ([place])    Depression    Diabetes mellitus without complication (HCC)    GERD (gastroesophageal reflux disease)    Hyperlipidemia    Hypertension    Migraine    Obesity    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 Burt Knack and Dr. Cyndia Bent   Severe aortic stenosis    Severe aortic insufficiency, moderate to severe aortic stenosis   Sleep apnea    CPAP    Venous insufficiency    managed by ortho (Dr. Sharol Given)    Family History  Problem Relation Age of Onset   Hypertension Mother    Alzheimer's disease Mother    Stroke Father        x 3   Heart attack Father    Congestive Heart Failure Father    Diabetes Other        mat great aunts   Colon cancer Neg Hx    Rectal cancer Neg Hx    Esophageal cancer Neg Hx     Past Surgical History:  Procedure Laterality Date   ANKLE SURGERY Right    Dr. Marcelino Scot, has pins   CARDIOVERSION N/A 07/16/2021   Procedure: CARDIOVERSION;  Surgeon: Berniece Salines, DO;  Location: [facility];  Service: Cardiovascular;  Laterality: N/A;   COLONOSCOPY W/ POLYPECTOMY     KNEE ARTHROSCOPY Right    Dr. Noemi Chapel   MULTIPLE EXTRACTIONS WITH ALVEOLOPLASTY Bilateral 05/22/2020   Procedure: MULTIPLE EXTRACTION WITH ALVEOLOPLASTY;  Surgeon: Diona Browner, DMD;  Location: [facility];  Service: Oral Surgery;  Laterality: Bilateral;   right foot surgery,rt great toe callus removal     RIGHT/LEFT HEART CATH AND CORONARY ANGIOGRAPHY N/A 03/06/2020   Procedure: RIGHT/LEFT  HEART CATH AND CORONARY ANGIOGRAPHY;  Surgeon: Sherren Mocha, MD;  Location: [facility];  Service: Cardiovascular;  Laterality: N/A;   TEE WITHOUT CARDIOVERSION N/A 03/09/2020   Procedure: TRANSESOPHAGEAL ECHOCARDIOGRAM (TEE);  Surgeon: Dorothy Spark, MD;  [facility];  Service: Cardiovascular;  Laterality: N/A;   TEE WITHOUT CARDIOVERSION N/A 06/30/2020   Procedure: TRANSESOPHAGEAL ECHOCARDIOGRAM (TEE);  Surgeon: Sherren Mocha, MD;  Location: [facility];  Service:  Open Heart Surgery;  Laterality: N/A;   TONSILLECTOMY AND ADENOIDECTOMY     age 25   TRANSCATHETER AORTIC VALVE REPLACEMENT, TRANSFEMORAL N/A 06/30/2020   Procedure: TRANSCATHETER AORTIC VALVE REPLACEMENT, TRANSFEMORAL;  Surgeon: Sherren Mocha, MD;  Location: [facility];  Service: Open Heart Surgery;  Laterality: N/A;   Social History   Occupational History   Occupation: retired Youth worker: RETIRED  Tobacco Use   Smoking status: Some Days    Packs/day: 0.75    Years: 55.00    Total pack years: 41.25    Types: Cigarettes   Smokeless tobacco: Never  Vaping Use   Vaping Use: Never used  Substance and Sexual Activity   Alcohol use: No    Alcohol/week: 0.0 standard drinks of alcohol   Drug use: No   Sexual activity: Yes    Birth control/protection: Condom

## 2021-12-07 ENCOUNTER — Encounter: Payer: Self-pay | Admitting: Orthopedic Surgery

## 2021-12-07 DIAGNOSIS — I5032 Chronic diastolic (congestive) heart failure: Secondary | ICD-10-CM | POA: Diagnosis not present

## 2021-12-07 DIAGNOSIS — E1142 Type 2 diabetes mellitus with diabetic polyneuropathy: Secondary | ICD-10-CM | POA: Diagnosis not present

## 2021-12-07 DIAGNOSIS — I739 Peripheral vascular disease, unspecified: Secondary | ICD-10-CM | POA: Diagnosis not present

## 2021-12-07 DIAGNOSIS — E785 Hyperlipidemia, unspecified: Secondary | ICD-10-CM | POA: Diagnosis not present

## 2021-12-07 DIAGNOSIS — I87319 Chronic venous hypertension (idiopathic) with ulcer of unspecified lower extremity: Secondary | ICD-10-CM | POA: Diagnosis not present

## 2021-12-07 DIAGNOSIS — E11621 Type 2 diabetes mellitus with foot ulcer: Secondary | ICD-10-CM | POA: Diagnosis not present

## 2021-12-07 DIAGNOSIS — I2584 Coronary atherosclerosis due to calcified coronary lesion: Secondary | ICD-10-CM | POA: Diagnosis not present

## 2021-12-07 DIAGNOSIS — I4891 Unspecified atrial fibrillation: Secondary | ICD-10-CM | POA: Diagnosis not present

## 2021-12-07 DIAGNOSIS — I700 Atherosclerosis of aorta: Secondary | ICD-10-CM | POA: Diagnosis not present

## 2021-12-07 DIAGNOSIS — N1831 Chronic kidney disease, stage 3a: Secondary | ICD-10-CM | POA: Diagnosis not present

## 2021-12-07 DIAGNOSIS — M25561 Pain in right knee: Secondary | ICD-10-CM | POA: Diagnosis not present

## 2021-12-07 DIAGNOSIS — D519 Vitamin B12 deficiency anemia, unspecified: Secondary | ICD-10-CM | POA: Diagnosis not present

## 2021-12-07 DIAGNOSIS — I110 Hypertensive heart disease with heart failure: Secondary | ICD-10-CM | POA: Diagnosis not present

## 2021-12-07 DIAGNOSIS — F418 Other specified anxiety disorders: Secondary | ICD-10-CM | POA: Diagnosis not present

## 2021-12-07 NOTE — Progress Notes (Signed)
Office Visit Note   Patient: Jordan [name]           Date of Birth: 06-Dec-1950           MRN: [id number] Visit Date: 11/23/2021              Requested by: [name], MD [address] [place],  [place] [address] PCP: [name], MD  Chief Complaint  Patient presents with   Right Leg - Wound Check      HPI: Patient is a 71 year old gentleman who presents in follow-up for venous insufficiency ulceration right lower extremity.  Patient states he is scheduled for a total knee arthroplasty on August 21 with Dr. Wynelle Link  Assessment & Plan: Visit Diagnoses:  1. Venous stasis dermatitis of both lower extremities     Plan: Follow-up weekly for Dynaflex compression wraps to the right lower extremity.  Patient states he has difficulty getting out of the house secondary to his arthritic knee.  Follow-Up Instructions: Return in about 1 week (around 11/30/2021).   Ortho Exam  Patient is alert, oriented, no adenopathy, well-dressed, normal affect, normal respiratory effort. Examination patient has a new venous stasis insufficiency blister right leg.  There is no cellulitis no signs of infection.  Imaging: No results found. No images are attached to the encounter.  Labs: Lab Results  Component Value Date   HGBA1C 5.3 03/05/2020   HGBA1C 5.7 (H) 03/14/2017   HGBA1C 5.0 10/30/2014   ESRSEDRATE 28 (H) 10/30/2014   CRP 6.5 (H) 10/30/2014   REPTSTATUS 12/31/2020 FINAL 12/29/2020   CULT MULTIPLE SPECIES PRESENT, SUGGEST RECOLLECTION (A) 12/29/2020     Lab Results  Component Value Date   ALBUMIN 2.6 (L) 12/31/2020   ALBUMIN 2.8 (L) 12/30/2020   ALBUMIN 3.1 (L) 12/29/2020   PREALBUMIN 9.4 (L) 10/30/2014    Lab Results  Component Value Date   MG 2.1 02/24/2021   MG 1.9 01/01/2021   MG 2.0 12/30/2020   No results found for: "VD25OH"  Lab Results  Component Value Date   PREALBUMIN 9.4 (L) 10/30/2014      Latest Ref Rng & Units 07/02/2021    1:22 PM 02/27/2021     4:12 AM 02/26/2021    3:50 AM  CBC EXTENDED  WBC 3.4 - 10.8 x10E3/uL 7.3  7.3  7.2   RBC 4.14 - 5.80 x10E6/uL 4.31  2.81  3.01   Hemoglobin 13.0 - 17.7 g/dL 11.0  8.1  8.4   HCT 37.5 - 51.0 % 35.0  25.9  27.4   Platelets 150 - 450 x10E3/uL 266  258  260      There is no height or weight on file to calculate BMI.  Orders:  No orders of the defined types were placed in this encounter.  No orders of the defined types were placed in this encounter.    Procedures: No procedures performed  Clinical Data: No additional findings.  ROS:  All other systems negative, except as noted in the HPI. Review of Systems  Objective: Vital Signs: There were no vitals taken for this visit.  Specialty Comments:  No specialty comments available.  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   Diabetes mellitus without complication (HCC)    COPD (chronic obstructive pulmonary disease) (HCC)    CAD (coronary artery disease)    Severe aortic stenosis s/p TAVR in Jan 2022    GERD (gastroesophageal reflux disease)    Protein-calorie malnutrition, severe 03/05/2020   Tobacco abuse 03/03/2020   Pressure injury of skin 03/15/2017   Chronic venous insufficiency 04/28/2014   PAD (peripheral artery disease) ([place]) 01/21/2014   Varicose veins of lower extremities with other complications 29/52/8413   Hx of TIA (transient  ischemic attack) and stroke 12/31/2013   HLD (hyperlipidemia) 12/31/2013   Severe obesity (BMI >= 40) ([place]) 12/31/2013   Essential hypertension 09/13/2012   Edema 09/13/2012   Past Medical History:  Diagnosis Date   Anxiety    Arthritis    knee and neck   CAD (coronary artery disease)    cath 9/21: LAD calcified, pLCx 29, mRCA 31, dRCA 30   Chronic diastolic CHF    COPD (chronic obstructive pulmonary disease) ([place])    Depression    Diabetes mellitus without complication (HCC)    GERD (gastroesophageal reflux disease)    Hyperlipidemia    Hypertension    Migraine    Obesity    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 Burt Knack and Dr. Cyndia Bent   Severe aortic stenosis    Severe aortic insufficiency, moderate to severe aortic stenosis   Sleep apnea    CPAP    Venous insufficiency    managed by ortho (Dr. Sharol Given)    Family History  Problem Relation Age of Onset   Hypertension Mother    Alzheimer's disease Mother    Stroke Father        x 3   Heart attack Father    Congestive Heart Failure Father    Diabetes Other        mat great aunts   Colon cancer Neg Hx    Rectal cancer Neg Hx    Esophageal cancer Neg Hx     Past Surgical History:  Procedure Laterality Date   ANKLE SURGERY Right    Dr. Marcelino Scot, has pins   CARDIOVERSION N/A 07/16/2021   Procedure: CARDIOVERSION;  Surgeon: Berniece Salines, DO;  Location: [facility];  Service: Cardiovascular;  Laterality: N/A;   COLONOSCOPY W/ POLYPECTOMY     KNEE ARTHROSCOPY Right    Dr. Noemi Chapel   MULTIPLE EXTRACTIONS WITH ALVEOLOPLASTY Bilateral 05/22/2020   Procedure: MULTIPLE EXTRACTION WITH ALVEOLOPLASTY;  Surgeon: Diona Browner, DMD;  Location: [facility];  Service: Oral Surgery;  Laterality: Bilateral;   right foot surgery,rt great toe callus removal     RIGHT/LEFT HEART CATH AND CORONARY ANGIOGRAPHY N/A 03/06/2020   Procedure: RIGHT/LEFT HEART CATH AND  CORONARY ANGIOGRAPHY;  Surgeon: Sherren Mocha, MD;  Location: [facility];  Service: Cardiovascular;  Laterality: N/A;   TEE WITHOUT CARDIOVERSION N/A 03/09/2020   Procedure: TRANSESOPHAGEAL ECHOCARDIOGRAM (TEE);  Surgeon: Dorothy Spark, MD;  Location: [facility] ENDOSCOPY;  Service: Cardiovascular;  Laterality: N/A;   TEE WITHOUT CARDIOVERSION N/A 06/30/2020   Procedure: TRANSESOPHAGEAL ECHOCARDIOGRAM (TEE);  Surgeon: Sherren Mocha, MD;  Location: [facility];  Service: Open Heart Surgery;  Laterality: N/A;  TONSILLECTOMY AND ADENOIDECTOMY     age 11   TRANSCATHETER AORTIC VALVE REPLACEMENT, TRANSFEMORAL N/A 06/30/2020   Procedure: TRANSCATHETER AORTIC VALVE REPLACEMENT, TRANSFEMORAL;  Surgeon: Sherren Mocha, MD;  Location: [facility];  Service: Open Heart Surgery;  Laterality: N/A;   Social History   Occupational History   Occupation: retired Youth worker: RETIRED  Tobacco Use   Smoking status: Some Days    Packs/day: 0.75    Years: 55.00    Total pack years: 41.25    Types: Cigarettes   Smokeless tobacco: Never  Vaping Use   Vaping Use: Never used  Substance and Sexual Activity   Alcohol use: No    Alcohol/week: 0.0 standard drinks of alcohol   Drug use: No   Sexual activity: Yes    Birth control/protection: Condom

## 2021-12-09 ENCOUNTER — Ambulatory Visit: Payer: Medicare PPO | Admitting: Orthopedic Surgery

## 2021-12-09 DIAGNOSIS — I872 Venous insufficiency (chronic) (peripheral): Secondary | ICD-10-CM

## 2021-12-13 ENCOUNTER — Telehealth (HOSPITAL_COMMUNITY): Payer: Self-pay | Admitting: Emergency Medicine

## 2021-12-13 ENCOUNTER — Encounter: Payer: Self-pay | Admitting: Orthopedic Surgery

## 2021-12-13 NOTE — Telephone Encounter (Signed)
Unable to leave vm Thoren Hosang RN Navigator Cardiac Imaging [facility] [phone] Office  [phone] Cell  

## 2021-12-14 ENCOUNTER — Ambulatory Visit (HOSPITAL_COMMUNITY)
Admission: RE | Admit: 2021-12-14 | Discharge: 2021-12-14 | Disposition: A | Discharge status: Home / Self Care | Payer: Medicare PPO | Source: Ambulatory Visit | Attending: Physician Assistant | Admitting: Physician Assistant

## 2021-12-14 DIAGNOSIS — I4819 Other persistent atrial fibrillation: Secondary | ICD-10-CM

## 2021-12-14 DIAGNOSIS — I350 Nonrheumatic aortic (valve) stenosis: Secondary | ICD-10-CM | POA: Diagnosis not present

## 2021-12-14 DIAGNOSIS — I700 Atherosclerosis of aorta: Secondary | ICD-10-CM | POA: Diagnosis not present

## 2021-12-14 DIAGNOSIS — I10 Essential (primary) hypertension: Secondary | ICD-10-CM | POA: Diagnosis not present

## 2021-12-14 DIAGNOSIS — F410 Panic disorder [episodic paroxysmal anxiety] without agoraphobia: Secondary | ICD-10-CM | POA: Diagnosis not present

## 2021-12-14 DIAGNOSIS — I2510 Atherosclerotic heart disease of native coronary artery without angina pectoris: Secondary | ICD-10-CM | POA: Diagnosis not present

## 2021-12-14 DIAGNOSIS — F411 Generalized anxiety disorder: Secondary | ICD-10-CM | POA: Diagnosis not present

## 2021-12-14 DIAGNOSIS — N183 Chronic kidney disease, stage 3 unspecified: Secondary | ICD-10-CM | POA: Diagnosis not present

## 2021-12-14 DIAGNOSIS — I5032 Chronic diastolic (congestive) heart failure: Secondary | ICD-10-CM

## 2021-12-14 DIAGNOSIS — E782 Mixed hyperlipidemia: Secondary | ICD-10-CM | POA: Diagnosis not present

## 2021-12-14 DIAGNOSIS — F331 Major depressive disorder, recurrent, moderate: Secondary | ICD-10-CM | POA: Diagnosis not present

## 2021-12-14 DIAGNOSIS — M419 Scoliosis, unspecified: Secondary | ICD-10-CM | POA: Diagnosis not present

## 2021-12-14 DIAGNOSIS — Z01810 Encounter for preprocedural cardiovascular examination: Secondary | ICD-10-CM | POA: Diagnosis not present

## 2021-12-14 DIAGNOSIS — J432 Centrilobular emphysema: Secondary | ICD-10-CM | POA: Diagnosis not present

## 2021-12-14 DIAGNOSIS — R590 Localized enlarged lymph nodes: Secondary | ICD-10-CM | POA: Diagnosis not present

## 2021-12-14 LAB — NM PET CT CARDIAC PERFUSION MULTI W/ABSOLUTE BLOODFLOW
LV dias vol: 167 mL (ref 62–150)
MBFR: 1.77
Nuc Rest EF: 51 %
Nuc Stress EF: 55 %
Peak HR: 74 {beats}/min
Rest HR: 54 {beats}/min
Rest MBF: 0.81 ml/g/min
Rest Nuclear Isotope Dose: 21.3 mCi
Rest perfusion cavity size (mL): 167 mL
ST Depression (mm): 0 mm
Stress MBF: 1.43 ml/g/min
Stress Nuclear Isotope Dose: 21.3 mCi
Stress perfusion cavity size (mL): 168 mL
TID: 1

## 2021-12-14 MED ORDER — RUBIDIUM RB82 GENERATOR (RUBYFILL)
25.0000 | PACK | Freq: Once | INTRAVENOUS | Status: AC
  Administered 2021-12-14: 21.23 via INTRAVENOUS

## 2021-12-14 MED ORDER — RUBIDIUM RB82 GENERATOR (RUBYFILL)
25.0000 | PACK | Freq: Once | INTRAVENOUS | Status: AC
  Administered 2021-12-14: 21.3 via INTRAVENOUS

## 2021-12-14 MED ORDER — REGADENOSON 0.4 MG/5ML IV SOLN
INTRAVENOUS | Status: AC
  Administered 2021-12-14: 0.4 mg via INTRAVENOUS
  Filled 2021-12-14: qty 5

## 2021-12-15 NOTE — Telephone Encounter (Signed)
Notes sent to surgeon. [name], PA-C    12/15/2021 1:27 PM

## 2022-01-17 NOTE — H&P (Signed)
TOTAL KNEE ADMISSION H&P  Patient is being admitted for right total knee arthroplasty.  Subjective:  Chief Complaint: Right knee pain.  HPI: Jordan [name], 71 y.o. male has a history of pain and functional disability in the right knee due to {trauma,arthritis:3049029} and has failed non-surgical conservative treatments for greater than 12 weeks to include {nonsurgical conservative treatment (must select two):3049030}. Onset of symptoms was {abrupt, gradual:20671}, starting {1->10 years:3049031} years ago with {stable, gradual worsening, rapidly worsening:3049032} course since that time. The patient noted {no past surgery, prior procedures:3049033} on the right knee.  Patient currently rates pain in the right knee at {1-10:3049035} out of 10 with activity. Patient has {night pain, worsening of pain with activity and weight bearing:3049036}. Patient has evidence of {Radiographic or MRI evidence of (must document one of the below):3046104} by imaging studies. This patient has had {failure of previous osteotomy, distal femur fracture:3049037}. There is no active infection.  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   Diabetes mellitus without complication (HCC)    COPD (chronic obstructive pulmonary disease) ([place])    CAD (coronary artery disease)    Severe aortic stenosis s/p TAVR in Jan 2022    GERD (gastroesophageal reflux  disease)    Protein-calorie malnutrition, severe 03/05/2020   Tobacco abuse 03/03/2020   Pressure injury of skin 03/15/2017   Chronic venous insufficiency 04/28/2014   PAD (peripheral artery disease) ([place]) 01/21/2014   Varicose veins of lower extremities with other complications 75/03/2584   Hx of TIA (transient ischemic attack) and stroke 12/31/2013   HLD (hyperlipidemia) 12/31/2013   Severe obesity (BMI >= 40) ([place]) 12/31/2013   Essential hypertension 09/13/2012   Edema 09/13/2012    Past Medical History:  Diagnosis Date   Anxiety    Arthritis    knee and neck   CAD (coronary artery disease)    cath 9/21: LAD calcified, pLCx 91, mRCA 38, dRCA 30   Chronic diastolic CHF    COPD (chronic obstructive pulmonary disease) ([place])    Depression    Diabetes mellitus without complication (HCC)    GERD (gastroesophageal reflux disease)    Hyperlipidemia    Hypertension    Migraine    Obesity    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 Burt Knack and Dr. Cyndia Bent   Severe aortic stenosis    Severe aortic insufficiency, moderate to severe aortic stenosis   Sleep apnea    CPAP    Venous insufficiency    managed by ortho (Dr. Sharol Given)    Past Surgical History:  Procedure Laterality Date   ANKLE SURGERY Right    Dr. Marcelino Scot, has pins   CARDIOVERSION N/A 07/16/2021   Procedure: CARDIOVERSION;  Surgeon: Berniece Salines, DO;  Location: [facility];  Service: Cardiovascular;  Laterality: N/A;   COLONOSCOPY W/ POLYPECTOMY     KNEE ARTHROSCOPY Right    Dr. Noemi Chapel  MULTIPLE EXTRACTIONS WITH ALVEOLOPLASTY Bilateral 05/22/2020   Procedure: MULTIPLE EXTRACTION WITH ALVEOLOPLASTY;  Surgeon: Diona Browner, DMD;  Location: [place];  Service: Oral Surgery;  Laterality: Bilateral;   right foot surgery,rt great toe callus removal     RIGHT/LEFT HEART CATH AND CORONARY ANGIOGRAPHY N/A 03/06/2020   Procedure: RIGHT/LEFT HEART CATH  AND CORONARY ANGIOGRAPHY;  Surgeon: Sherren Mocha, MD;  Location: [facility];  Service: Cardiovascular;  Laterality: N/A;   TEE WITHOUT CARDIOVERSION N/A 03/09/2020   Procedure: TRANSESOPHAGEAL ECHOCARDIOGRAM (TEE);  Surgeon: Dorothy Spark, MD;  Location: [facility];  Service: Cardiovascular;  Laterality: N/A;   TEE WITHOUT CARDIOVERSION N/A 06/30/2020   Procedure: TRANSESOPHAGEAL ECHOCARDIOGRAM (TEE);  Surgeon: Sherren Mocha, MD;  Location: [place];  Service: Open Heart Surgery;  Laterality: N/A;   TONSILLECTOMY AND ADENOIDECTOMY     age 64   TRANSCATHETER AORTIC VALVE REPLACEMENT, TRANSFEMORAL N/A 06/30/2020   Procedure: TRANSCATHETER AORTIC VALVE REPLACEMENT, TRANSFEMORAL;  Surgeon: Sherren Mocha, MD;  Location: [place];  Service: Open Heart Surgery;  Laterality: N/A;    Prior to Admission medications   Medication Sig Start Date End Date Taking? Authorizing Provider  ACETAMINOPHEN-BUTALBITAL 50-325 MG TABS butalbital 50 mg-acetaminophen 325 mg tablet  TAKE 1 TABLET BY MOUTH TWICE A DAY AS NEEDED FOR MIGRAINE    [provider]  apixaban (ELIQUIS) 5 MG TABS tablet Take 1 tablet (5 mg total) by mouth 2 (two) times daily. 07/02/21   Eileen Stanford, PA-C  atorvastatin (LIPITOR) 80 MG tablet Take 80 mg by mouth daily after supper.     [provider]  bismuth subsalicylate (PEPTO BISMOL) 262 MG/15ML suspension Take 30 mLs by mouth every 6 (six) hours as needed for indigestion.    [provider]  budesonide-formoterol (SYMBICORT) 160-4.5 MCG/ACT inhaler Inhale 2 puffs into the lungs in the morning and at bedtime. 03/12/20   Aline August, MD  butalbital-acetaminophen-caffeine (FIORICET, ESGIC) 50-325-40 MG per tablet Take 1 tablet by mouth 2 (two) times daily as needed for migraine.     [provider]  carboxymethylcellulose (REFRESH PLUS) 0.5 % SOLN Place 1 drop into both eyes daily as needed (dry eyes).    [provider]  doxepin  (SINEQUAN) 50 MG capsule Take by mouth as needed. 09/21/21   [provider]  folic acid (FOLVITE) 1 MG tablet Take 1 tablet (1 mg total) by mouth daily. 11/04/14   [name], MD  furosemide (LASIX) 40 MG tablet Take 1 tablet by mouth every other day alternating with 1/2 tablet every other day 03/16/21   [name] T, PA-C  ipratropium-albuterol (DUONEB) 0.5-2.5 (3) MG/3ML SOLN Take 3 mLs by nebulization every 6 (six) hours as needed (shortness of breath/wheezing). 03/12/20   Aline August, MD  LORazepam (ATIVAN) 1 MG tablet Take 1 mg by mouth 3 (three) times daily. 02/22/21   [provider]  methocarbamol (ROBAXIN) 750 MG tablet Take 750 mg by mouth as needed for muscle spasms. 06/03/20   [provider]  Metoprolol Tartrate 75 MG TABS Take 37.5 mg by mouth 2 (two) times daily. 11/03/21 02/01/22  [name] T, PA-C  montelukast (SINGULAIR) 10 MG tablet Take 10 mg by mouth as needed (for allergies). 02/07/20   [provider]  Multiple Vitamin (MULTIVITAMIN WITH MINERALS) TABS Take 1 tablet by mouth daily.    [provider]  nystatin cream (MYCOSTATIN) APPLY TO AFFECTED AREA TWICE A DAY 07/20/21   Eileen Stanford, PA-C  pantoprazole ([place])  40 MG tablet Take 1 tablet (40 mg total) by mouth daily. 07/03/20   Eileen Stanford, PA-C  promethazine (PHENERGAN) 25 MG tablet Take 25 mg by mouth every 8 (eight) hours as needed for nausea/vomiting. 05/14/20   [provider]  venlafaxine XR (EFFEXOR-XR) 75 MG 24 hr capsule Take 75 mg by mouth in the morning and at bedtime. MUST HAVE BRAND NAME ONLY    [provider]    Allergies  Allergen Reactions   Altace [Ramipril] Other (See Comments)    Dizziness, migraine, weakness- "there is dye in it"   Codeine Itching   Naproxen Itching    Social History   Socioeconomic History   Marital status: Single    Spouse name: Not on file   Number of children: 0   Years of education: Not  on file   Highest education level: Not on file  Occupational History   Occupation: retired Youth worker: RETIRED  Tobacco Use   Smoking status: Some Days    Packs/day: 0.75    Years: 55.00    Total pack years: 41.25    Types: Cigarettes   Smokeless tobacco: Never  Vaping Use   Vaping Use: Never used  Substance and Sexual Activity   Alcohol use: No    Alcohol/week: 0.0 standard drinks of alcohol   Drug use: No   Sexual activity: Yes    Birth control/protection: Condom  Other Topics Concern   Not on file  Social History Narrative   Lives alone.     Social Determinants of Health   Financial Resource Strain: Not on file  Food Insecurity: Not on file  Transportation Needs: Not on file  Physical Activity: Not on file  Stress: Not on file  Social Connections: Not on file  Intimate Partner Violence: Not on file    Tobacco Use: High Risk (12/13/2021)   Patient History    Smoking Tobacco Use: Some Days    Smokeless Tobacco Use: Never    Passive Exposure: Not on file   Social History   Substance and Sexual Activity  Alcohol Use No   Alcohol/week: 0.0 standard drinks of alcohol    Family History  Problem Relation Age of Onset   Hypertension Mother    Alzheimer's disease Mother    Stroke Father        x 3   Heart attack Father    Congestive Heart Failure Father    Diabetes Other        mat great aunts   Colon cancer Neg Hx    Rectal cancer Neg Hx    Esophageal cancer Neg Hx     ROS  Objective:  Physical Exam: ***  Vital signs in last 24 hours: BP: ()/()  Arterial Line BP: ()/()   Imaging Review Plain radiographs demonstrate {mild/mod/severe:3049053} degenerative joint disease of the right knee. The overall alignment is {Neutral/varus:3049054}. The bone quality appears to be {good/fair/poor/excellent:33178} for age and reported activity level.  Assessment/Plan:  End stage arthritis, right knee   The patient history, physical  examination, clinical judgment of the provider and imaging studies are consistent with end stage degenerative joint disease of the right knee and total knee arthroplasty is deemed medically necessary. The treatment options including medical management, injection therapy arthroscopy and arthroplasty were discussed at length. The risks and benefits of total knee arthroplasty were presented and reviewed. The risks due to aseptic loosening, infection, stiffness, patella tracking problems, thromboembolic complications and other imponderables were  discussed. The patient acknowledged the explanation, agreed to proceed with the plan and consent was signed. Patient is being admitted for inpatient treatment for surgery, pain control, PT, OT, prophylactic antibiotics, VTE prophylaxis, progressive ambulation and ADLs and discharge planning. The patient is planning to be discharged {home with home health services/to inpatient DUK[facility]:3818403}.  {ORTHOADMISSIONSTATUS:21269}  ***  - Patient was instructed on what medications to stop prior to surgery. - Follow-up visit in 2 weeks with Dr. Wynelle Link - Begin physical therapy following surgery - Pre-operative lab work as pre-surgical testing - Prescriptions will be provided in hospital at time of discharge  Theresa Duty, PA-C Orthopedic Surgery [facility] [place]

## 2022-01-25 NOTE — Patient Instructions (Signed)
SURGICAL WAITING ROOM VISITATION Patients having surgery or a procedure may have no more than 2 support people in the waiting area - these visitors may rotate.   Children under the age of 2 must have an adult with them who is not the patient. If the patient needs to stay at the hospital during part of their recovery, the visitor guidelines for inpatient rooms apply. Pre-op nurse will coordinate an appropriate time for 1 support person to accompany patient in pre-op.  This support person may not rotate.    Please refer to the [facility] website for the visitor guidelines for Inpatients (after your surgery is over and you are in a regular room).       Your procedure is scheduled on:  02/07/22    Report to [facility] Main Entrance    Report to admitting at  0800 AM   Call this number if you have problems the morning of surgery [phone]   Do not eat food :After Midnight.   After Midnight you may have the following liquids until ___ 0730am  DAY OF SURGERY  Water Non-Citrus Juices (without pulp, NO RED) Carbonated Beverages Black Coffee (NO MILK/CREAM OR CREAMERS, sugar ok)  Clear Tea (NO MILK/CREAM OR CREAMERS, sugar ok) regular and decaf                             Plain Jell-O (NO RED)                                           Fruit ices (not with fruit pulp, NO RED)                                     Popsicles (NO RED)                                                               Sports drinks like Gatorade (NO RED)                      The day of surgery:  Drink ONE (1) Pre-Surgery Clear Ensure or G2 at 0730  AM  ( have completed by ) the morning of surgery. Drink in one sitting. Do not sip.  This drink was given to you during your hospital  pre-op appointment visit. Nothing else to drink after completing the  Pre-Surgery Clear Ensure or G2.           Oral Hygiene is also important to reduce your risk of infection.                                     Remember - BRUSH YOUR TEETH THE MORNING OF SURGERY WITH YOUR REGULAR TOOTHPASTE   Do NOT smoke after Midnight   Take these medicines the morning of surgery with A SIP OF WATER:  inhalers as usual and bring, metoprolol, protonix, effexor   DO NOT TAKE ANY ORAL DIABETIC MEDICATIONS DAY  OF YOUR SURGERY  Bring CPAP mask and tubing day of surgery.                              You may not have any metal on your body including hair pins, jewelry, and body piercing             Do not wear make-up, lotions, powders, perfumes/cologne, or deodorant  Do not wear nail polish including gel and S&S, artificial/acrylic nails, or any other type of covering on natural nails including finger and toenails. If you have artificial nails, gel coating, etc. that needs to be removed by a nail salon please have this removed prior to surgery or surgery may need to be canceled/ delayed if the surgeon/ anesthesia feels like they are unable to be safely monitored.   Do not shave  48 hours prior to surgery.               Men may shave face and neck.   Do not bring valuables to the hospital. [place].   Contacts, dentures or bridgework may not be worn into surgery.   Bring small overnight bag day of surgery.   DO NOT [place]. PHARMACY WILL DISPENSE MEDICATIONS LISTED ON YOUR MEDICATION LIST TO YOU DURING YOUR ADMISSION [place]!    Patients discharged on the day of surgery will not be allowed to drive home.  Someone NEEDS to stay with you for the first 24 hours after anesthesia.   Special Instructions: Bring a copy of your healthcare power of attorney and living will documents         the day of surgery if you haven't scanned them before.              Please read over the following fact sheets you were given: IF YOU HAVE QUESTIONS ABOUT YOUR PRE-OP INSTRUCTIONS PLEASE CALL [phone]     [facility] - Preparing for  Surgery Before surgery, you can play an important role.  Because skin is not sterile, your skin needs to be as free of germs as possible.  You can reduce the number of germs on your skin by washing with CHG (chlorahexidine gluconate) soap before surgery.  CHG is an antiseptic cleaner which kills germs and bonds with the skin to continue killing germs even after washing. Please DO NOT use if you have an allergy to CHG or antibacterial soaps.  If your skin becomes reddened/irritated stop using the CHG and inform your nurse when you arrive at Short Stay. Do not shave (including legs and underarms) for at least 48 hours prior to the first CHG shower.  You may shave your face/neck. Please follow these instructions carefully:  1.  Shower with CHG Soap the night before surgery and the  morning of Surgery.  2.  If you choose to wash your hair, wash your hair first as usual with your  normal  shampoo.  3.  After you shampoo, rinse your hair and body thoroughly to remove the  shampoo.                           4.  Use CHG as you would any other liquid soap.  You can apply chg directly  to the  skin and wash                       Gently with a scrungie or clean washcloth.  5.  Apply the CHG Soap to your body ONLY FROM THE NECK DOWN.   Do not use on face/ open                           Wound or open sores. Avoid contact with eyes, ears mouth and genitals (private parts).                       Wash face,  Genitals (private parts) with your normal soap.             6.  Wash thoroughly, paying special attention to the area where your surgery  will be performed.  7.  Thoroughly rinse your body with warm water from the neck down.  8.  DO NOT shower/wash with your normal soap after using and rinsing off  the CHG Soap.                9.  Pat yourself dry with a clean towel.            10.  Wear clean pajamas.            11.  Place clean sheets on your bed the night of your first shower and do not  sleep with pets. Day  of Surgery : Do not apply any lotions/deodorants the morning of surgery.  Please wear clean clothes to the hospital/surgery center.  FAILURE TO FOLLOW THESE INSTRUCTIONS MAY RESULT IN THE CANCELLATION OF YOUR SURGERY PATIENT SIGNATURE_________________________________  NURSE SIGNATURE__________________________________  ________________________________________________________________________

## 2022-01-25 NOTE — Progress Notes (Signed)
Anesthesia Review:  PCP: Cardiologist : Chest x-ray : EKG : Echo : Stress test: Cardiac Cath :  Activity level:  Sleep Study/ CPAP : Fasting Blood Sugar :      / Checks Blood Sugar -- times a day:   Blood Thinner/ Instructions /Last Dose: ASA / Instructions/ Last Dose :   Eliquis

## 2022-01-26 ENCOUNTER — Encounter (HOSPITAL_COMMUNITY): Payer: Self-pay

## 2022-01-26 ENCOUNTER — Other Ambulatory Visit: Payer: Self-pay

## 2022-01-26 ENCOUNTER — Encounter (HOSPITAL_COMMUNITY)
Admission: RE | Admit: 2022-01-26 | Discharge: 2022-01-26 | Disposition: A | Discharge status: Home / Self Care | Payer: Medicare PPO | Source: Ambulatory Visit | Attending: Orthopedic Surgery | Admitting: Orthopedic Surgery

## 2022-01-26 VITALS — BP 161/44 | HR 66 | Temp 97.9°F | Resp 16 | Ht 72.0 in

## 2022-01-26 DIAGNOSIS — Z01818 Encounter for other preprocedural examination: Secondary | ICD-10-CM | POA: Insufficient documentation

## 2022-01-26 HISTORY — DX: Unspecified atrial fibrillation: I48.91

## 2022-01-26 HISTORY — DX: Peripheral vascular disease, unspecified: I73.9

## 2022-01-26 HISTORY — DX: Malignant (primary) neoplasm, unspecified: C80.1

## 2022-01-26 HISTORY — DX: Anemia, unspecified: D64.9

## 2022-01-26 LAB — BASIC METABOLIC PANEL
Anion gap: 6 (ref 5–15)
BUN: 22 mg/dL (ref 8–23)
CO2: 26 mmol/L (ref 22–32)
Calcium: 8.6 mg/dL — ABNORMAL LOW (ref 8.9–10.3)
Chloride: 106 mmol/L (ref 98–111)
Creatinine, Ser: 1.3 mg/dL — ABNORMAL HIGH (ref 0.61–1.24)
GFR, Estimated: 59 mL/min — ABNORMAL LOW (ref >60)
Glucose, Bld: 76 mg/dL (ref 70–99)
Potassium: 4.7 mmol/L (ref 3.5–5.1)
Sodium: 138 mmol/L (ref 135–145)

## 2022-01-26 LAB — CBC
HCT: 36.5 % — ABNORMAL LOW (ref 39.0–52.0)
Hemoglobin: 11.4 g/dL — ABNORMAL LOW (ref 13.0–17.0)
MCH: 30 pg (ref 26.0–34.0)
MCHC: 31.2 g/dL (ref 30.0–36.0)
MCV: 96.1 fL (ref 80.0–100.0)
Platelets: 150 10*3/uL (ref 150–400)
RBC: 3.8 MIL/uL — ABNORMAL LOW (ref 4.22–5.81)
RDW: 14.2 % (ref 11.5–15.5)
WBC: 5.3 10*3/uL (ref 4.0–10.5)
nRBC: 0 % (ref 0.0–0.2)

## 2022-01-27 ENCOUNTER — Encounter (HOSPITAL_COMMUNITY): Payer: Self-pay

## 2022-01-27 LAB — SURGICAL PCR SCREEN
MRSA, PCR: NEGATIVE
Staphylococcus aureus: POSITIVE — AB

## 2022-01-27 NOTE — Progress Notes (Signed)
Anesthesia Chart Review:   Case: [id number] Date/Time: 02/07/22 1015   Procedure: TOTAL KNEE ARTHROPLASTY (Right: Knee)   Anesthesia type: Choice   Pre-op diagnosis: right knee osteoarthritis   Location: [facility]   Surgeons: Gaynelle Arabian, MD       DISCUSSION: Pt is 71 years old with hx CAD (mild nonobstructive by 2021 cath), chronic diastolic HF, AS (s/p TAVR 06/30/20), persistent afib (s/p cardioversion 07/16/21), PVD, HTN, DM (pt reports he lost weight and no longer has DM), COPD, OSA. Current smoker.   At pre-surgical testing, unable to get SpO2 on any finger or on either earlobe. Pt's distal fingers are blue. His radial pulses are normal bilaterally. Pt reports this always happens when he is cold, and he feels cold at PST. Pt reports when he has appointments in other clinics, the same thing happens; to obtain SpO2, pt reports other clinics use hand warmers. When he feels warm, his hands are pink. Pt reports this is not a new phenomenon for him or change in his usual baseline. He denies pain. Skin color of arms is normal. His legs appear mottled and he reports his toes experience the same changes as his fingers when he is cold. He is wearing compression socks. I did  not remove them to inspect his feet; he assures me Dr. Wynelle Link has done so. Pt does have chronic venous insufficiency. Has hx of poorly healing leg/foot ulcers. Pt reports at pre-surgical testing his leg wounds have healed; last office visit with Meridee Score, MD for this 12/09/21 documents wounds healed.  - Based on symptoms and exam, I suspect he may be experiencing Raynaud phenomenon. We discussed avoiding cold and keeping warm.   Pt to hold eliquis 4 days before surgery   VS: BP (!) 161/44   Pulse 66   Temp 36.6 C (Oral)   Resp 16   Ht 6' (1.829 m)   BMI 24.55 kg/m   PROVIDERS: - PCP is [name], MD - Cardiologist is Candee Furbish, MD. Last office visit 11/03/21 with [name], PA  LABS: Labs  reviewed: Acceptable for surgery. (all labs ordered are listed, but only abnormal results are displayed)  Labs Reviewed  SURGICAL PCR SCREEN - Abnormal; Notable for the following components:      Result Value   Staphylococcus aureus POSITIVE (*)    All other components within normal limits  BASIC METABOLIC PANEL - Abnormal; Notable for the following components:   Creatinine, Ser 1.30 (*)    Calcium 8.6 (*)    GFR, Estimated 59 (*)    All other components within normal limits  CBC - Abnormal; Notable for the following components:   RBC 3.80 (*)    Hemoglobin 11.4 (*)    HCT 36.5 (*)    All other components within normal limits     IMAGES: PET CT cardiac - radiology overread 12/14/21:  1. Upper normal sized lower paratracheal and right hilar lymph nodes. 2.  Emphysema (ICD10-J43.9). 3.  Aortic Atherosclerosis (ICD10-I70.0). 4. Dextroconvex lower thoracic and upper lumbar scoliosis with rotary component.  CXR 02/23/21:  - Interval transcatheter aortic valve replacement. Resolution of previously noted pulmonary edema. - Small right pleural effusion.   EKG 11/17/21: NSR   CV: Cardiac PET CT perfusion study 12/14/21:    LV perfusion is normal. There is no evidence of ischemia. There is no evidence of infarction.   Rest left ventricular function is normal. Rest EF: 51 %. Stress left ventricular function  is normal. Stress EF: 55 %. End diastolic cavity size is normal.   Myocardial blood flow was computed to be 0.77m/g/min at rest and 1.465mg/min at stress. Global myocardial blood flow reserve was 1.77 and was normal.   Coronary calcium was present on the attenuation correction CT images. Severe coronary calcifications were present. Coronary calcifications were present in the left anterior descending artery, left circumflex artery and right coronary artery distribution(s).   The study is normal. The study is low risk.   Echo 07/14/21:  1. 26 mm S3 (06/30/2020). Vmax 2.1 m/s, MG 8.2  mmHG, EOA 1.65 cm2, DI 0.43. Mild PVL in the 12-2 o'clock position. Appears mild on this study. The aortic valve has been repaired/replaced. Aortic valve regurgitation is  mild. There is a 26 mm Sapien prosthetic (TAVR) valve present in the aortic position. Procedure Date: 06/30/2020.  2. Left ventricular ejection fraction, by estimation, is 55 to 60%. The left ventricle has normal function. The left ventricle has no regional wall motion abnormalities. Left ventricular diastolic function could not be evaluated.  3. Right ventricular systolic function is normal. The right ventricular size is normal.  4. Left atrial size was severely dilated.  5. Right atrial size was mildly dilated.  6. The mitral valve is grossly normal. Trivial mitral valve regurgitation. No evidence of mitral stenosis.   Carotid [place]/18/21:  - Right Carotid: The extracranial vessels were near-normal with only minimal wall thickening or plaque.  - Left Carotid: The extracranial vessels were near-normal with only minimal wall thickening or plaque.  - Vertebrals:  Bilateral vertebral arteries demonstrate antegrade flow.  - Subclavians: Normal flow hemodynamics were seen in bilateral subclavian arteries.  Cardiac cath 03/06/20:  1.  Nonobstructive coronary artery disease with mild nonobstructive plaquing in the RCA, wide patency of the left main, wide patency of the LAD, and mild to moderate mid circumflex stenosis 2.  Moderate aortic stenosis with mean gradient 26 mmHg and calculated aortic valve area 1.6 cm. 3.  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 CoBurt Knacknd Dr. BaCyndia Bent Severe aortic stenosis    Severe aortic insufficiency, moderate to severe aortic stenosis   Sleep apnea    no longer uses cpap   Venous insufficiency    managed by ortho (Dr. DuSharol Given   Past Surgical History:  Procedure Laterality Date   ANKLE SURGERY Right    Dr. HaMarcelino Scothas pins   CARDIOVERSION N/A 07/16/2021   Procedure: CARDIOVERSION;  Surgeon: ToBerniece SalinesDO;  Location: [facility];  Service: Cardiovascular;  Laterality: N/A;   COLONOSCOPY W/ POLYPECTOMY     KNEE  ARTHROSCOPY Right    Dr. Noemi Chapel   MULTIPLE EXTRACTIONS WITH ALVEOLOPLASTY Bilateral 05/22/2020   Procedure: MULTIPLE EXTRACTION WITH ALVEOLOPLASTY;  Surgeon: Diona Browner, DMD;  Location: [place];  Service: Oral Surgery;  Laterality: Bilateral;   right foot surgery,rt great toe callus removal     RIGHT/LEFT HEART CATH AND CORONARY ANGIOGRAPHY N/A 03/06/2020   Procedure: RIGHT/LEFT HEART CATH AND CORONARY ANGIOGRAPHY;  Surgeon: Sherren Mocha, MD;  Location: [place] [facility];  Service: Cardiovascular;  Laterality: N/A;   TEE WITHOUT CARDIOVERSION N/A 03/09/2020   Procedure: TRANSESOPHAGEAL ECHOCARDIOGRAM (TEE);  Surgeon: Dorothy Spark, MD;  Location: [facility];  Service: Cardiovascular;  Laterality: N/A;   TEE WITHOUT CARDIOVERSION N/A 06/30/2020   Procedure: TRANSESOPHAGEAL ECHOCARDIOGRAM (TEE);  Surgeon: Sherren Mocha, MD;  Location: [place];  Service: Open Heart Surgery;  Laterality: N/A;   TONSILLECTOMY AND ADENOIDECTOMY     age 62   TRANSCATHETER AORTIC VALVE REPLACEMENT, TRANSFEMORAL N/A 06/30/2020   Procedure: TRANSCATHETER AORTIC VALVE REPLACEMENT,  TRANSFEMORAL;  Surgeon: Sherren Mocha, MD;  Location: [place];  Service: Open Heart Surgery;  Laterality: N/A;   UPPER GI ENDOSCOPY      MEDICATIONS:  ACETAMINOPHEN-BUTALBITAL 50-325 MG TABS   apixaban (ELIQUIS) 5 MG TABS tablet   atorvastatin (LIPITOR) 80 MG tablet   bismuth subsalicylate (PEPTO BISMOL) 262 MG/15ML suspension   budesonide-formoterol (SYMBICORT) 160-4.5 MCG/ACT inhaler   carboxymethylcellulose (REFRESH PLUS) 0.5 % SOLN   doxepin (SINEQUAN) 50 MG capsule   folic acid (FOLVITE) 1 MG tablet   furosemide (LASIX) 40 MG tablet   ipratropium-albuterol (DUONEB) 0.5-2.5 (3) MG/3ML SOLN   LORazepam (ATIVAN) 1 MG tablet   methocarbamol (ROBAXIN) 750 MG tablet   Metoprolol Tartrate 75 MG TABS   montelukast (SINGULAIR) 10 MG tablet   Multiple Vitamin (MULTIVITAMIN WITH MINERALS) TABS   nystatin cream (MYCOSTATIN)   omeprazole (PRILOSEC OTC) 20 MG tablet   promethazine (PHENERGAN) 25 MG tablet   venlafaxine XR (EFFEXOR-XR) 75 MG 24 hr capsule   pantoprazole (PROTONIX) 40 MG tablet   No current facility-administered medications for this encounter.    If no changes, I anticipate pt can proceed with surgery as scheduled.   Willeen Cass, PhD, FNP-BC [facility]/Anesthesiology Phone: [phone] 01/28/2022 3:25 PM

## 2022-01-28 NOTE — Anesthesia Preprocedure Evaluation (Addendum)
Anesthesia Evaluation  Patient identified by MRN, date of birth, ID band Patient awake    Reviewed: Allergy & Precautions, NPO status , Patient's Chart, lab work & pertinent test results  Airway Mallampati: II  TM Distance: >3 FB Neck ROM: Full    Dental  (+) Edentulous Upper, Missing, Dental Advisory Given   Pulmonary sleep apnea , COPD, Current Smoker and Patient abstained from smoking.,     + decreased breath sounds      Cardiovascular hypertension, Pt. on medications and Pt. on home beta blockers + CAD, + Peripheral Vascular Disease and +CHF   Rhythm:Irregular Rate:Normal  - s/p TAVR   Neuro/Psych  Headaches, PSYCHIATRIC DISORDERS Anxiety Depression    GI/Hepatic Neg liver ROS, GERD  Medicated,  Endo/Other  diabetes  Renal/GU Renal disease     Musculoskeletal  (+) Arthritis ,   Abdominal Normal abdominal exam  (+)   Peds  Hematology negative hematology ROS (+)   Anesthesia Other Findings   Reproductive/Obstetrics                          Anesthesia Physical Anesthesia Plan  ASA: 3  Anesthesia Plan: Spinal   Post-op Pain Management: Regional block*   Induction: Intravenous  PONV Risk Score and Plan: 1 and Propofol infusion and Ondansetron  Airway Management Planned: Natural Airway and Simple Face Mask  Additional Equipment: None  Intra-op Plan:   Post-operative Plan:   Informed Consent: I have reviewed the patients History and Physical, chart, labs and discussed the procedure including the risks, benefits and alternatives for the proposed anesthesia with the patient or authorized representative who has indicated his/her understanding and acceptance.       Plan Discussed with: CRNA  Anesthesia Plan Comments: (See APP note by Durel Salts, FNP )      Anesthesia Quick Evaluation

## 2022-02-01 DIAGNOSIS — F331 Major depressive disorder, recurrent, moderate: Secondary | ICD-10-CM | POA: Diagnosis not present

## 2022-02-01 DIAGNOSIS — F411 Generalized anxiety disorder: Secondary | ICD-10-CM | POA: Diagnosis not present

## 2022-02-01 DIAGNOSIS — F410 Panic disorder [episodic paroxysmal anxiety] without agoraphobia: Secondary | ICD-10-CM | POA: Diagnosis not present

## 2022-02-07 ENCOUNTER — Observation Stay (HOSPITAL_COMMUNITY)
Admission: RE | Admit: 2022-02-07 | Discharge: 2022-02-09 | Disposition: A | Discharge status: Skilled Nursing Facility | Payer: Medicare PPO | Source: Ambulatory Visit | Attending: Orthopedic Surgery | Admitting: Orthopedic Surgery

## 2022-02-07 ENCOUNTER — Ambulatory Visit (HOSPITAL_BASED_OUTPATIENT_CLINIC_OR_DEPARTMENT_OTHER): Payer: Medicare PPO | Admitting: Registered Nurse

## 2022-02-07 ENCOUNTER — Encounter (HOSPITAL_COMMUNITY)
Admission: RE | Disposition: A | Discharge status: Skilled Nursing Facility | Payer: Self-pay | Source: Ambulatory Visit | Attending: Orthopedic Surgery

## 2022-02-07 ENCOUNTER — Other Ambulatory Visit: Payer: Self-pay

## 2022-02-07 ENCOUNTER — Ambulatory Visit (HOSPITAL_COMMUNITY): Payer: Medicare PPO | Admitting: Vascular Surgery

## 2022-02-07 ENCOUNTER — Encounter (HOSPITAL_COMMUNITY): Payer: Self-pay | Admitting: Orthopedic Surgery

## 2022-02-07 DIAGNOSIS — J449 Chronic obstructive pulmonary disease, unspecified: Secondary | ICD-10-CM | POA: Diagnosis not present

## 2022-02-07 DIAGNOSIS — Z8673 Personal history of transient ischemic attack (TIA), and cerebral infarction without residual deficits: Secondary | ICD-10-CM | POA: Insufficient documentation

## 2022-02-07 DIAGNOSIS — Z79899 Other long term (current) drug therapy: Secondary | ICD-10-CM | POA: Insufficient documentation

## 2022-02-07 DIAGNOSIS — F17210 Nicotine dependence, cigarettes, uncomplicated: Secondary | ICD-10-CM

## 2022-02-07 DIAGNOSIS — E1122 Type 2 diabetes mellitus with diabetic chronic kidney disease: Secondary | ICD-10-CM | POA: Insufficient documentation

## 2022-02-07 DIAGNOSIS — N183 Chronic kidney disease, stage 3 unspecified: Secondary | ICD-10-CM | POA: Insufficient documentation

## 2022-02-07 DIAGNOSIS — M1711 Unilateral primary osteoarthritis, right knee: Secondary | ICD-10-CM

## 2022-02-07 DIAGNOSIS — G8918 Other acute postprocedural pain: Secondary | ICD-10-CM | POA: Diagnosis not present

## 2022-02-07 DIAGNOSIS — I4819 Other persistent atrial fibrillation: Secondary | ICD-10-CM | POA: Diagnosis not present

## 2022-02-07 DIAGNOSIS — I110 Hypertensive heart disease with heart failure: Secondary | ICD-10-CM | POA: Diagnosis not present

## 2022-02-07 DIAGNOSIS — I509 Heart failure, unspecified: Secondary | ICD-10-CM | POA: Diagnosis not present

## 2022-02-07 DIAGNOSIS — Z85820 Personal history of malignant melanoma of skin: Secondary | ICD-10-CM | POA: Insufficient documentation

## 2022-02-07 DIAGNOSIS — Z01818 Encounter for other preprocedural examination: Secondary | ICD-10-CM

## 2022-02-07 DIAGNOSIS — I2510 Atherosclerotic heart disease of native coronary artery without angina pectoris: Secondary | ICD-10-CM

## 2022-02-07 DIAGNOSIS — I5032 Chronic diastolic (congestive) heart failure: Secondary | ICD-10-CM | POA: Diagnosis not present

## 2022-02-07 DIAGNOSIS — I130 Hypertensive heart and chronic kidney disease with heart failure and stage 1 through stage 4 chronic kidney disease, or unspecified chronic kidney disease: Secondary | ICD-10-CM | POA: Insufficient documentation

## 2022-02-07 DIAGNOSIS — M179 Osteoarthritis of knee, unspecified: Secondary | ICD-10-CM | POA: Diagnosis present

## 2022-02-07 HISTORY — PX: TOTAL KNEE ARTHROPLASTY: SHX125

## 2022-02-07 SURGERY — ARTHROPLASTY, KNEE, TOTAL
Anesthesia: Spinal | Site: Knee | Laterality: Right

## 2022-02-07 MED ORDER — ENSURE ENLIVE PO LIQD: 237.0000 mL | Freq: Two times a day (BID) | ORAL | Status: DC

## 2022-02-07 MED ORDER — DEXAMETHASONE SODIUM PHOSPHATE 10 MG/ML IJ SOLN
INTRAMUSCULAR | Status: AC
  Filled 2022-02-07: qty 1

## 2022-02-07 MED ORDER — LACTATED RINGERS IV SOLN: INTRAVENOUS | Status: DC

## 2022-02-07 MED ORDER — PROMETHAZINE HCL 25 MG/ML IJ SOLN: 6.2500 mg | INTRAMUSCULAR | Status: DC | PRN

## 2022-02-07 MED ORDER — APIXABAN 2.5 MG PO TABS
2.5000 mg | ORAL_TABLET | Freq: Two times a day (BID) | ORAL | Status: DC
  Administered 2022-02-08 – 2022-02-09 (×3): 2.5 mg via ORAL
  Filled 2022-02-07 (×3): qty 1

## 2022-02-07 MED ORDER — METOCLOPRAMIDE HCL 5 MG/ML IJ SOLN: 5.0000 mg | Freq: Three times a day (TID) | INTRAMUSCULAR | Status: DC | PRN

## 2022-02-07 MED ORDER — MENTHOL 3 MG MT LOZG: 1.0000 | LOZENGE | OROMUCOSAL | Status: DC | PRN

## 2022-02-07 MED ORDER — CEFAZOLIN SODIUM-DEXTROSE 2-4 GM/100ML-% IV SOLN
2.0000 g | Freq: Four times a day (QID) | INTRAVENOUS | Status: AC
  Administered 2022-02-07 (×2): 2 g via INTRAVENOUS
  Filled 2022-02-07 (×2): qty 100

## 2022-02-07 MED ORDER — METHOCARBAMOL 500 MG IVPB - SIMPLE MED: 500.0000 mg | Freq: Four times a day (QID) | INTRAVENOUS | Status: DC | PRN

## 2022-02-07 MED ORDER — PANTOPRAZOLE SODIUM 20 MG PO TBEC
40.0000 mg | DELAYED_RELEASE_TABLET | Freq: Every day | ORAL | Status: DC
  Administered 2022-02-07 – 2022-02-09 (×3): 40 mg via ORAL
  Filled 2022-02-07 (×3): qty 2

## 2022-02-07 MED ORDER — DEXAMETHASONE SODIUM PHOSPHATE 10 MG/ML IJ SOLN
8.0000 mg | Freq: Once | INTRAMUSCULAR | Status: AC
  Administered 2022-02-07: 8 mg via INTRAVENOUS

## 2022-02-07 MED ORDER — BISMUTH SUBSALICYLATE 262 MG/15ML PO SUSP
30.0000 mL | Freq: Four times a day (QID) | ORAL | Status: DC | PRN
Start: 2022-02-07 — End: 2022-02-09
  Administered 2022-02-08: 30 mL via ORAL
  Filled 2022-02-07: qty 236

## 2022-02-07 MED ORDER — IPRATROPIUM-ALBUTEROL 0.5-2.5 (3) MG/3ML IN SOLN: 3.0000 mL | Freq: Four times a day (QID) | RESPIRATORY_TRACT | Status: DC | PRN

## 2022-02-07 MED ORDER — BISACODYL 10 MG RE SUPP: 10.0000 mg | Freq: Every day | RECTAL | Status: DC | PRN

## 2022-02-07 MED ORDER — FLEET ENEMA 7-19 GM/118ML RE ENEM: 1.0000 | ENEMA | Freq: Once | RECTAL | Status: DC | PRN

## 2022-02-07 MED ORDER — DIPHENHYDRAMINE HCL 12.5 MG/5ML PO ELIX: 12.5000 mg | ORAL_SOLUTION | ORAL | Status: DC | PRN

## 2022-02-07 MED ORDER — SODIUM CHLORIDE (PF) 0.9 % IJ SOLN
INTRAMUSCULAR | Status: AC
  Filled 2022-02-07: qty 50

## 2022-02-07 MED ORDER — SODIUM CHLORIDE 0.9 % IV SOLN: INTRAVENOUS | Status: DC

## 2022-02-07 MED ORDER — METOPROLOL TARTRATE 25 MG PO TABS
37.5000 mg | ORAL_TABLET | Freq: Every day | ORAL | Status: DC
  Administered 2022-02-08 – 2022-02-09 (×2): 37.5 mg via ORAL
  Filled 2022-02-07 (×2): qty 2

## 2022-02-07 MED ORDER — POLYETHYLENE GLYCOL 3350 17 G PO PACK: 17.0000 g | PACK | Freq: Every day | ORAL | Status: DC | PRN

## 2022-02-07 MED ORDER — PHENYLEPHRINE HCL-NACL 20-0.9 MG/250ML-% IV SOLN
INTRAVENOUS | Status: AC
  Filled 2022-02-07: qty 250

## 2022-02-07 MED ORDER — VENLAFAXINE HCL ER 75 MG PO CP24
75.0000 mg | ORAL_CAPSULE | Freq: Every day | ORAL | Status: DC
  Administered 2022-02-08 – 2022-02-09 (×2): 75 mg via ORAL
  Filled 2022-02-07 (×2): qty 1

## 2022-02-07 MED ORDER — SODIUM CHLORIDE (PF) 0.9 % IJ SOLN
INTRAMUSCULAR | Status: DC | PRN
  Administered 2022-02-07: 60 mL

## 2022-02-07 MED ORDER — TRANEXAMIC ACID-NACL 1000-0.7 MG/100ML-% IV SOLN
1000.0000 mg | INTRAVENOUS | Status: AC
  Administered 2022-02-07: 1000 mg via INTRAVENOUS
  Filled 2022-02-07: qty 100

## 2022-02-07 MED ORDER — BUPIVACAINE LIPOSOME 1.3 % IJ SUSP
INTRAMUSCULAR | Status: AC
  Filled 2022-02-07: qty 20

## 2022-02-07 MED ORDER — PHENYLEPHRINE 80 MCG/ML (10ML) SYRINGE FOR IV PUSH (FOR BLOOD PRESSURE SUPPORT)
PREFILLED_SYRINGE | INTRAVENOUS | Status: DC | PRN
  Administered 2022-02-07: 160 ug via INTRAVENOUS
  Administered 2022-02-07: 80 ug via INTRAVENOUS

## 2022-02-07 MED ORDER — ONDANSETRON HCL 4 MG/2ML IJ SOLN
INTRAMUSCULAR | Status: AC
Start: 2022-02-07 — End: ?
  Filled 2022-02-07: qty 2

## 2022-02-07 MED ORDER — MONTELUKAST SODIUM 10 MG PO TABS: 10.0000 mg | ORAL_TABLET | ORAL | Status: DC | PRN

## 2022-02-07 MED ORDER — ACETAMINOPHEN 500 MG PO TABS
1000.0000 mg | ORAL_TABLET | Freq: Four times a day (QID) | ORAL | Status: AC
  Administered 2022-02-07 – 2022-02-08 (×3): 1000 mg via ORAL
  Filled 2022-02-07 (×3): qty 2

## 2022-02-07 MED ORDER — METHOCARBAMOL 500 MG PO TABS
500.0000 mg | ORAL_TABLET | Freq: Four times a day (QID) | ORAL | Status: DC | PRN
  Administered 2022-02-07 – 2022-02-09 (×4): 500 mg via ORAL
  Filled 2022-02-07 (×4): qty 1

## 2022-02-07 MED ORDER — DOXEPIN HCL 50 MG PO CAPS: 50.0000 mg | ORAL_CAPSULE | Freq: Every evening | ORAL | Status: DC | PRN

## 2022-02-07 MED ORDER — SODIUM CHLORIDE 0.9 % IR SOLN
Status: DC | PRN
  Administered 2022-02-07 (×2): 1000 mL

## 2022-02-07 MED ORDER — POVIDONE-IODINE 10 % EX SWAB
2.0000 | Freq: Once | CUTANEOUS | Status: AC
  Administered 2022-02-07: 2 via TOPICAL

## 2022-02-07 MED ORDER — SODIUM CHLORIDE (PF) 0.9 % IJ SOLN
INTRAMUSCULAR | Status: AC
  Filled 2022-02-07: qty 10

## 2022-02-07 MED ORDER — PROPOFOL 1000 MG/100ML IV EMUL
INTRAVENOUS | Status: AC
Start: 2022-02-07 — End: ?
  Filled 2022-02-07: qty 100

## 2022-02-07 MED ORDER — ACETAMINOPHEN 10 MG/ML IV SOLN: 1000.0000 mg | Freq: Once | INTRAVENOUS | Status: DC | PRN

## 2022-02-07 MED ORDER — ONDANSETRON HCL 4 MG/2ML IJ SOLN
INTRAMUSCULAR | Status: DC | PRN
  Administered 2022-02-07: 4 mg via INTRAVENOUS

## 2022-02-07 MED ORDER — ONDANSETRON HCL 4 MG/2ML IJ SOLN
4.0000 mg | Freq: Four times a day (QID) | INTRAMUSCULAR | Status: DC | PRN
  Administered 2022-02-09: 4 mg via INTRAVENOUS
  Filled 2022-02-07: qty 2

## 2022-02-07 MED ORDER — HYDROMORPHONE HCL 1 MG/ML IJ SOLN: 0.2500 mg | INTRAMUSCULAR | Status: DC | PRN

## 2022-02-07 MED ORDER — ACETAMINOPHEN 10 MG/ML IV SOLN
1000.0000 mg | Freq: Four times a day (QID) | INTRAVENOUS | Status: DC
  Administered 2022-02-07: 1000 mg via INTRAVENOUS
  Filled 2022-02-07: qty 100

## 2022-02-07 MED ORDER — MOMETASONE FURO-FORMOTEROL FUM 200-5 MCG/ACT IN AERO: 2.0000 | INHALATION_SPRAY | Freq: Two times a day (BID) | RESPIRATORY_TRACT | Status: DC | PRN

## 2022-02-07 MED ORDER — OXYCODONE HCL 5 MG PO TABS
10.0000 mg | ORAL_TABLET | ORAL | Status: DC | PRN
  Administered 2022-02-07 – 2022-02-08 (×3): 10 mg via ORAL
  Filled 2022-02-07 (×3): qty 2

## 2022-02-07 MED ORDER — HYDROMORPHONE HCL 1 MG/ML IJ SOLN: 0.5000 mg | INTRAMUSCULAR | Status: DC | PRN

## 2022-02-07 MED ORDER — ACETAMINOPHEN 160 MG/5ML PO SOLN: 325.0000 mg | Freq: Once | ORAL | Status: DC | PRN

## 2022-02-07 MED ORDER — PHENYLEPHRINE 80 MCG/ML (10ML) SYRINGE FOR IV PUSH (FOR BLOOD PRESSURE SUPPORT)
PREFILLED_SYRINGE | INTRAVENOUS | Status: AC
  Filled 2022-02-07: qty 10

## 2022-02-07 MED ORDER — METOCLOPRAMIDE HCL 5 MG PO TABS: 5.0000 mg | ORAL_TABLET | Freq: Three times a day (TID) | ORAL | Status: DC | PRN

## 2022-02-07 MED ORDER — ONDANSETRON HCL 4 MG PO TABS: 4.0000 mg | ORAL_TABLET | Freq: Four times a day (QID) | ORAL | Status: DC | PRN

## 2022-02-07 MED ORDER — AMISULPRIDE (ANTIEMETIC) 5 MG/2ML IV SOLN: 10.0000 mg | Freq: Once | INTRAVENOUS | Status: DC | PRN

## 2022-02-07 MED ORDER — DOCUSATE SODIUM 100 MG PO CAPS
100.0000 mg | ORAL_CAPSULE | Freq: Two times a day (BID) | ORAL | Status: DC
  Administered 2022-02-07 – 2022-02-09 (×4): 100 mg via ORAL
  Filled 2022-02-07 (×4): qty 1

## 2022-02-07 MED ORDER — BUPIVACAINE IN DEXTROSE 0.75-8.25 % IT SOLN
INTRATHECAL | Status: DC | PRN
  Administered 2022-02-07: 1.6 mL via INTRATHECAL

## 2022-02-07 MED ORDER — POLYVINYL ALCOHOL 1.4 % OP SOLN: 1.0000 [drp] | Freq: Every day | OPHTHALMIC | Status: DC | PRN

## 2022-02-07 MED ORDER — CHLORHEXIDINE GLUCONATE 0.12 % MT SOLN
15.0000 mL | Freq: Once | OROMUCOSAL | Status: AC
  Administered 2022-02-07: 15 mL via OROMUCOSAL

## 2022-02-07 MED ORDER — BUTALBITAL-APAP-CAFFEINE 50-325-40 MG PO TABS
1.0000 | ORAL_TABLET | Freq: Two times a day (BID) | ORAL | Status: DC | PRN
Start: 2022-02-07 — End: 2022-02-09

## 2022-02-07 MED ORDER — PROPOFOL 500 MG/50ML IV EMUL
INTRAVENOUS | Status: DC | PRN
  Administered 2022-02-07: 40 ug/kg/min via INTRAVENOUS

## 2022-02-07 MED ORDER — PHENOL 1.4 % MT LIQD: 1.0000 | OROMUCOSAL | Status: DC | PRN

## 2022-02-07 MED ORDER — LORAZEPAM 1 MG PO TABS
1.0000 mg | ORAL_TABLET | Freq: Every day | ORAL | Status: DC
  Administered 2022-02-07 – 2022-02-08 (×2): 1 mg via ORAL
  Filled 2022-02-07 (×2): qty 1

## 2022-02-07 MED ORDER — CEFAZOLIN SODIUM-DEXTROSE 2-4 GM/100ML-% IV SOLN
2.0000 g | INTRAVENOUS | Status: AC
  Administered 2022-02-07: 2 g via INTRAVENOUS
  Filled 2022-02-07: qty 100

## 2022-02-07 MED ORDER — ORAL CARE MOUTH RINSE: 15.0000 mL | Freq: Once | OROMUCOSAL | Status: AC

## 2022-02-07 MED ORDER — ATORVASTATIN CALCIUM 40 MG PO TABS
80.0000 mg | ORAL_TABLET | Freq: Every day | ORAL | Status: DC
  Administered 2022-02-08 – 2022-02-09 (×2): 80 mg via ORAL
  Filled 2022-02-07 (×2): qty 2

## 2022-02-07 MED ORDER — FENTANYL CITRATE PF 50 MCG/ML IJ SOSY
50.0000 ug | PREFILLED_SYRINGE | INTRAMUSCULAR | Status: DC
  Administered 2022-02-07: 25 ug via INTRAVENOUS
  Filled 2022-02-07: qty 2

## 2022-02-07 MED ORDER — ORAL CARE MOUTH RINSE
15.0000 mL | OROMUCOSAL | Status: DC | PRN
Start: 2022-02-07 — End: 2022-02-09

## 2022-02-07 MED ORDER — DEXAMETHASONE SODIUM PHOSPHATE 10 MG/ML IJ SOLN
10.0000 mg | Freq: Once | INTRAMUSCULAR | Status: AC
  Administered 2022-02-08: 10 mg via INTRAVENOUS
  Filled 2022-02-07: qty 1

## 2022-02-07 MED ORDER — ACETAMINOPHEN 325 MG PO TABS: 325.0000 mg | ORAL_TABLET | Freq: Once | ORAL | Status: DC | PRN

## 2022-02-07 MED ORDER — BUPIVACAINE LIPOSOME 1.3 % IJ SUSP
INTRAMUSCULAR | Status: DC | PRN
  Administered 2022-02-07: 20 mL

## 2022-02-07 MED ORDER — MIDAZOLAM HCL 2 MG/2ML IJ SOLN
1.0000 mg | INTRAMUSCULAR | Status: DC
  Filled 2022-02-07: qty 2

## 2022-02-07 MED ORDER — PROMETHAZINE HCL 25 MG PO TABS: 25.0000 mg | ORAL_TABLET | Freq: Three times a day (TID) | ORAL | Status: DC | PRN

## 2022-02-07 MED ORDER — BUPIVACAINE LIPOSOME 1.3 % IJ SUSP: 20.0000 mL | Freq: Once | INTRAMUSCULAR | Status: AC

## 2022-02-07 MED ORDER — ROPIVACAINE HCL 5 MG/ML IJ SOLN
INTRAMUSCULAR | Status: DC | PRN
  Administered 2022-02-07: 30 mL via PERINEURAL

## 2022-02-07 MED ORDER — OXYCODONE HCL 5 MG PO TABS
5.0000 mg | ORAL_TABLET | ORAL | Status: DC | PRN
  Administered 2022-02-08 – 2022-02-09 (×2): 5 mg via ORAL
  Filled 2022-02-07 (×2): qty 1

## 2022-02-07 MED ORDER — PHENYLEPHRINE HCL-NACL 20-0.9 MG/250ML-% IV SOLN
INTRAVENOUS | Status: DC | PRN
  Administered 2022-02-07: 25 ug/min via INTRAVENOUS

## 2022-02-07 SURGICAL SUPPLY — 56 items
ATTUNE MED DOME PAT 41 KNEE (Knees) IMPLANT
ATTUNE PS FEM RT SZ 8 CEM KNEE (Femur) IMPLANT
ATTUNE PSRP INSR SZ8 8 KNEE (Insert) IMPLANT
BAG COUNTER SPONGE SURGICOUNT (BAG) IMPLANT
BAG ZIPLOCK 12X15 (MISCELLANEOUS) ×1 IMPLANT
BASE TIBIAL ROT PLAT SZ 8 KNEE (Knees) IMPLANT
BLADE SAG 18X100X1.27 (BLADE) ×1 IMPLANT
BLADE SAW SGTL 11.0X1.19X90.0M (BLADE) ×1 IMPLANT
BNDG ELASTIC 6X5.8 VLCR STR LF (GAUZE/BANDAGES/DRESSINGS) ×1 IMPLANT
BOWL SMART MIX CTS (DISPOSABLE) ×1 IMPLANT
CEMENT HV SMART SET (Cement) ×2 IMPLANT
CLOSURE STERI STRIP 1/2 X4 (GAUZE/BANDAGES/DRESSINGS) IMPLANT
COVER SURGICAL LIGHT HANDLE (MISCELLANEOUS) ×1 IMPLANT
CUFF TOURN SGL QUICK 34 (TOURNIQUET CUFF) ×1
CUFF TRNQT CYL 34X4.125X (TOURNIQUET CUFF) ×1 IMPLANT
DRAPE INCISE IOBAN 66X45 STRL (DRAPES) ×1 IMPLANT
DRAPE U-SHAPE 47X51 STRL (DRAPES) ×1 IMPLANT
DRSG AQUACEL AG ADV 3.5X10 (GAUZE/BANDAGES/DRESSINGS) ×1 IMPLANT
DURAPREP 26ML APPLICATOR (WOUND CARE) ×1 IMPLANT
ELECT REM PT RETURN 15FT ADLT (MISCELLANEOUS) ×1 IMPLANT
GLOVE BIO SURGEON STRL SZ 6.5 (GLOVE) IMPLANT
GLOVE BIO SURGEON STRL SZ7.5 (GLOVE) IMPLANT
GLOVE BIO SURGEON STRL SZ8 (GLOVE) ×1 IMPLANT
GLOVE BIOGEL PI IND STRL 6.5 (GLOVE) IMPLANT
GLOVE BIOGEL PI IND STRL 7.0 (GLOVE) IMPLANT
GLOVE BIOGEL PI IND STRL 8 (GLOVE) ×1 IMPLANT
GLOVE BIOGEL PI INDICATOR 6.5 (GLOVE)
GLOVE BIOGEL PI INDICATOR 7.0 (GLOVE)
GLOVE BIOGEL PI INDICATOR 8 (GLOVE) ×1
GOWN STRL REUS W/ TWL LRG LVL3 (GOWN DISPOSABLE) ×1 IMPLANT
GOWN STRL REUS W/ TWL XL LVL3 (GOWN DISPOSABLE) IMPLANT
GOWN STRL REUS W/TWL LRG LVL3 (GOWN DISPOSABLE) ×1
GOWN STRL REUS W/TWL XL LVL3 (GOWN DISPOSABLE)
HANDPIECE INTERPULSE COAX TIP (DISPOSABLE) ×1
HOLDER FOLEY CATH W/STRAP (MISCELLANEOUS) IMPLANT
IMMOBILIZER KNEE 20 (SOFTGOODS) ×1
IMMOBILIZER KNEE 20 THIGH 36 (SOFTGOODS) ×1 IMPLANT
KIT TURNOVER KIT A (KITS) IMPLANT
MANIFOLD NEPTUNE II (INSTRUMENTS) ×1 IMPLANT
NS IRRIG 1000ML POUR BTL (IV SOLUTION) ×1 IMPLANT
PACK TOTAL KNEE CUSTOM (KITS) ×1 IMPLANT
PADDING CAST COTTON 6X4 STRL (CAST SUPPLIES) ×2 IMPLANT
PROTECTOR NERVE ULNAR (MISCELLANEOUS) ×1 IMPLANT
SET HNDPC FAN SPRY TIP SCT (DISPOSABLE) ×1 IMPLANT
SPIKE FLUID TRANSFER (MISCELLANEOUS) ×1 IMPLANT
STRIP CLOSURE SKIN 1/2X4 (GAUZE/BANDAGES/DRESSINGS) ×2 IMPLANT
SUT MNCRL AB 4-0 PS2 18 (SUTURE) ×1 IMPLANT
SUT STRATAFIX 0 PDS 27 VIOLET (SUTURE) ×1
SUT VIC AB 2-0 CT1 27 (SUTURE) ×3
SUT VIC AB 2-0 CT1 TAPERPNT 27 (SUTURE) ×3 IMPLANT
SUTURE STRATFX 0 PDS 27 VIOLET (SUTURE) ×1 IMPLANT
TIBIAL BASE ROT PLAT SZ 8 KNEE (Knees) ×1 IMPLANT
TRAY FOLEY MTR SLVR 16FR STAT (SET/KITS/TRAYS/PACK) ×1 IMPLANT
TUBE SUCTION HIGH CAP CLEAR NV (SUCTIONS) ×1 IMPLANT
WATER STERILE IRR 1000ML POUR (IV SOLUTION) ×2 IMPLANT
WRAP KNEE MAXI GEL POST OP (GAUZE/BANDAGES/DRESSINGS) ×1 IMPLANT

## 2022-02-07 NOTE — Evaluation (Signed)
Physical Therapy Evaluation Patient Details Name: Jordan [name] MRN: [id number] DOB: 09-Nov-1950 Today's Date: 02/07/2022  History of Present Illness  Pt is a 71yo male presenting s/p R-TKA on 02/07/22. PMH: anemia, afib, CAD s/p cardioversion 07/16/21 and R&L cath 2021, CHF, COPD with tobacco abuse, GERD, anxiety & depression, HLD, HTN, PVD, OSA, s/p TAVR (06/30/2020), PVD with BLE edema and history of venous stasis ulcers, R ankle & foot surgery, CKD3, chronic respiratory failure with hypoxia, DM, hx of TIA.   Clinical Impression  NESTOR WIENEKE is a 71 y.o. male POD 0 s/p R-TKA. Patient reports modified independence with mobility at baseline. Patient is now limited by functional impairments (see PT problem list below) and requires supervision for bed mobility and min assist for transfers. Patient was able to ambulate 20 feet with RW and min guard level of assist. Patient instructed in exercise to facilitate ROM and circulation to manage edema. Provided incentive spirometer and with Vcs pt able to achieve 1233m. Patient will benefit from continued skilled PT interventions to address impairments and progress towards PLOF. Acute PT will follow to progress mobility and HEP in preparation for safe discharge to SNF-level therapies which pt has begun process of setting up..       Recommendations for follow up therapy are one component of a multi-disciplinary discharge planning process, led by the attending physician.  Recommendations may be updated based on patient status, additional functional criteria and insurance authorization.  Follow Up Recommendations Skilled nursing-short term rehab (<3 hours/day) (Pt has already sought arrangements) Can patient physically be transported by private vehicle: No    Assistance Recommended at Discharge Intermittent Supervision/Assistance  Patient can return home with the following  A little help with walking and/or transfers;A little help with  bathing/dressing/bathroom;Assistance with cooking/housework;Help with stairs or ramp for entrance;Assist for transportation    Equipment Recommendations None recommended by PT  Recommendations for Other Services       Functional Status Assessment Patient has had a recent decline in their functional status and demonstrates the ability to make significant improvements in function in a reasonable and predictable amount of time.     Precautions / Restrictions Precautions Precautions: Fall;Knee Precaution Booklet Issued: No Required Braces or Orthoses: Knee Immobilizer - Right Knee Immobilizer - Right: On when out of bed or walking Restrictions Weight Bearing Restrictions: No Other Position/Activity Restrictions: WBAT      Mobility  Bed Mobility Overal bed mobility: Needs Assistance Bed Mobility: Supine to Sit     Supine to sit: Supervision     General bed mobility comments: For safety only    Transfers Overall transfer level: Needs assistance Equipment used: Rolling walker (2 wheels) Transfers: Sit to/from Stand Sit to Stand: Min assist, From elevated surface           General transfer comment: Min assist for steadying of RW from elevated surface.    Ambulation/Gait Ambulation/Gait assistance: Min guard, +2 safety/equipment Gait Distance (Feet): 20 Feet Assistive device: Rolling walker (2 wheels) Gait Pattern/deviations: Step-to pattern Gait velocity: decreased     General Gait Details: Pt ambulated with RW and min guard, no physical assist or overt LOB noted, +2 for recliner follow for safety.  Stairs            Wheelchair Mobility    Modified Rankin (Stroke Patients Only)       Balance Overall balance assessment: Needs assistance Sitting-balance support: Feet supported, No upper extremity supported Sitting balance-Leahy Scale: Good  Standing balance support: Reliant on assistive device for balance, During functional activity, Bilateral  upper extremity supported Standing balance-Leahy Scale: Poor                               Pertinent Vitals/Pain Pain Assessment Pain Assessment: 0-10 Pain Score: 1  Pain Location: right knee Pain Descriptors / Indicators: Operative site guarding Pain Intervention(s): Limited activity within patient's tolerance, Monitored during session, Repositioned, Ice applied    Home Living Family/patient expects to be discharged to:: Skilled nursing facility Living Arrangements: Alone                 Additional Comments: 8 stairs to get in the house. One story in the inside with a stepdown living room. Walk in shower with grab bar and shower bench. Toilet is regular height with BSC over it. Has RW, SPC.    Prior Function Prior Level of Function : Independent/Modified Independent             Mobility Comments: CNA assist with bathing, dressing, going to the bathroom ADLs Comments: CNA, daily for ~3.5hours in the evening. Grocery shopping, cooking,     Hand Dominance   Dominant Hand: Right    Extremity/Trunk Assessment        Lower Extremity Assessment Lower Extremity Assessment: RLE deficits/detail;LLE deficits/detail RLE Deficits / Details: MMT ank DF/PF 4/5, no extensor lag noted RLE Sensation: WNL LLE Deficits / Details: MMT ank DF/PF 4/5 LLE Sensation: WNL    Cervical / Trunk Assessment Cervical / Trunk Assessment: Kyphotic  Communication   Communication: No difficulties  Cognition Arousal/Alertness: Awake/alert Behavior During Therapy: WFL for tasks assessed/performed Overall Cognitive Status: Within Functional Limits for tasks assessed                                          General Comments      Exercises Total Joint Exercises Ankle Circles/Pumps: AROM, Both, 10 reps   Assessment/Plan    PT Assessment Patient needs continued PT services  PT Problem List Decreased strength;Decreased range of motion;Decreased activity  tolerance;Decreased balance;Decreased mobility;Decreased coordination;Pain       PT Treatment Interventions DME instruction;Gait training;Stair training;Functional mobility training;Therapeutic activities;Therapeutic exercise;Balance training;Neuromuscular re-education;Patient/family education    PT Goals (Current goals can be found in the Care Plan section)  Acute Rehab PT Goals Patient Stated Goal: Increase independence, drive, go out to eat PT Goal Formulation: With patient Time For Goal Achievement: 02/14/22 Potential to Achieve Goals: Good    Frequency 7X/week     Co-evaluation               AM-PAC PT "6 Clicks" Mobility  Outcome Measure Help needed turning from your back to your side while in a flat bed without using bedrails?: A Little Help needed moving from lying on your back to sitting on the side of a flat bed without using bedrails?: A Little Help needed moving to and from a bed to a chair (including a wheelchair)?: A Little Help needed standing up from a chair using your arms (e.g., wheelchair or bedside chair)?: A Little Help needed to walk in hospital room?: A Little Help needed climbing 3-5 steps with a railing? : A Lot 6 Click Score: 17    End of Session Equipment Utilized During Treatment: Gait belt;Right knee immobilizer Activity Tolerance: Patient  tolerated treatment well;No increased pain Patient left: in chair;with call bell/phone within reach;with chair alarm set;with SCD's reapplied Nurse Communication: Mobility status PT Visit Diagnosis: Pain;Difficulty in walking, not elsewhere classified (R26.2) Pain - Right/Left: Right Pain - part of body: Knee    Time: 4765-4650 PT Time Calculation (min) (ACUTE ONLY): 33 min   Charges:   PT Evaluation $PT Eval Low Complexity: 1 Low PT Treatments $Gait Training: 8-22 mins        Coolidge Breeze, PT, DPT [place] [facility] Office: [phone] Pager: [phone]  Coolidge Breeze 02/07/2022, 7:10 PM

## 2022-02-07 NOTE — Progress Notes (Signed)
Orthopedic Tech Progress Note Patient Details:  Jordan [name] 04/26/1951 [id number]  CPM Right Knee CPM Right Knee: On Right Knee Flexion (Degrees): 10 Right Knee Extension (Degrees): 40  Post Interventions Patient Tolerated: Well  Dimitri Dsouza A [name] 02/07/2022, 2:09 PM

## 2022-02-07 NOTE — Anesthesia Procedure Notes (Signed)
Spinal  Start time: 02/07/2022 10:47 AM End time: 02/07/2022 10:50 AM Reason for block: surgical anesthesia Staffing Performed: anesthesiologist  Anesthesiologist: Effie Berkshire, MD Performed by: Effie Berkshire, MD Authorized by: Effie Berkshire, MD   Preanesthetic Checklist Completed: patient identified, IV checked, site marked, risks and benefits discussed, surgical consent, monitors and equipment checked, pre-op evaluation and timeout performed Spinal Block Patient position: sitting Prep: DuraPrep and site prepped and draped Location: L3-4 Injection technique: single-shot Needle Needle type: Pencan  Needle gauge: 24 G Needle length: 10 cm Needle insertion depth: 10 cm Assessment Events: CSF return and second provider Additional Notes Patient tolerated well. No immediate complications.  CRNA x1, MDA x1  Functioning IV was confirmed and monitors were applied. Sterile prep and drape, including hand hygiene and sterile gloves were used. The patient was positioned and the back was prepped. The skin was anesthetized with lidocaine. Free flow of clear CSF was obtained prior to injecting local anesthetic into the CSF. The spinal needle aspirated freely following injection. The needle was carefully withdrawn. The patient tolerated the procedure well.

## 2022-02-07 NOTE — Anesthesia Postprocedure Evaluation (Signed)
Anesthesia Post Note  Patient: Jordan [name]  Procedure(s) Performed: TOTAL KNEE ARTHROPLASTY (Right: Knee)     Patient location during evaluation: PACU Anesthesia Type: Spinal Level of consciousness: oriented and awake and alert Pain management: pain level controlled Vital Signs Assessment: post-procedure vital signs reviewed and stable Respiratory status: spontaneous breathing, respiratory function stable and patient connected to nasal cannula oxygen Cardiovascular status: blood pressure returned to baseline and stable Postop Assessment: no headache, no backache and no apparent nausea or vomiting Anesthetic complications: no   No notable events documented.  Last Vitals:  Vitals:   02/07/22 1515 02/07/22 1616  BP: (!) 132/55 (!) 102/53  Pulse: 75 (!) 57  Resp: 16 18  Temp:  36.6 C  SpO2: 100% 98%    Last Pain:  Vitals:   02/07/22 1445  TempSrc:   PainSc: 0-No pain                 Effie Berkshire

## 2022-02-07 NOTE — Op Note (Signed)
OPERATIVE REPORT-TOTAL KNEE ARTHROPLASTY   Pre-operative diagnosis- Osteoarthritis  Right knee(s)  Post-operative diagnosis- Osteoarthritis Right knee(s)  Procedure-  Right  Total Knee Arthroplasty  Surgeon- Dione Plover. Bodee Lafoe, MD  Assistant- Molli Barrows, PA-C   Anesthesia-   Adductor canal block and spinal  EBL- 100 ml   Drains None  Tourniquet time- No tourniquet utilized  Complications- None  Condition-PACU - hemodynamically stable.   Brief Clinical Note  Jordan [name] is a 71 y.o. year old male with end stage OA of his right knee with progressively worsening pain and dysfunction. He has constant pain, with activity and at rest and significant functional deficits with difficulties even with ADLs. He has had extensive non-op management including analgesics, injections of cortisone and viscosupplements, and home exercise program, but remains in significant pain with significant dysfunction. Radiographs show bone on bone arthritis lateral and patellofemoral. He presents now for right Total Knee Arthroplasty.     Procedure in detail---   The patient is brought into the operating room and positioned supine on the operating table. After successful administration of  Adductor canal block and spinal,   a tourniquet is placed high on the  Right thigh(s) and the lower extremity is prepped and draped in the usual sterile fashion.       A midline incision is made with a ten blade through the subcutaneous tissue to the level of the extensor mechanism. A fresh blade is used to make a medial parapatellar arthrotomy. Soft tissue over the proximal medial tibia is subperiosteally elevated to the joint line with a knife and into the semimembranosus bursa with a Cobb elevator. Soft tissue over the proximal lateral tibia is elevated with attention being paid to avoiding the patellar tendon on the tibial tubercle. The patella is everted, knee flexed 90 degrees and the ACL and PCL are removed.  Findings are bone on bone lateral and patellofemoral with large global osteophytes        The drill is used to create a starting hole in the distal femur and the canal is thoroughly irrigated with sterile saline to remove the fatty contents. The 5 degree Left  valgus alignment guide is placed into the femoral canal and the distal femoral cutting block is pinned to remove 9 mm off the distal femur. Resection is made with an oscillating saw.      The tibia is subluxed forward and the menisci are removed. The extramedullary alignment guide is placed referencing proximally at the medial aspect of the tibial tubercle and distally along the second metatarsal axis and tibial crest. The block is pinned to remove 78m off the more deficient lateral  side. Resection is made with an oscillating saw. Size 8is the most appropriate size for the tibia and the proximal tibia is prepared with the modular drill and keel punch for that size.      The femoral sizing guide is placed and size 8 is most appropriate. Rotation is marked off the epicondylar axis and confirmed by creating a rectangular flexion gap at 90 degrees. The size 8 cutting block is pinned in this rotation and the anterior, posterior and chamfer cuts are made with the oscillating saw. The intercondylar block is then placed and that cut is made.      Trial size 8 tibial component, trial size 8 posterior stabilized femur and a 9  mm posterior stabilized rotating platform insert trial is placed. Full extension is achieved with excellent varus/valgus and anterior/posterior balance throughout full range  of motion. The patella is everted and thickness measured to be 27  mm. Free hand resection is taken to 15 mm, a 41 template is placed, lug holes are drilled, trial patella is placed, and it tracks normally. Osteophytes are removed off the posterior femur with the trial in place. All trials are removed and the cut bone surfaces prepared with pulsatile lavage. Cement is  mixed and once ready for implantation, the size 8 tibial implant, size  8 posterior stabilized femoral component, and the size 41 patella are cemented in place and the patella is held with the clamp. The trial insert is placed and the knee held in full extension. The Exparel (20 ml mixed with 60 ml saline) is injected into the extensor mechanism, posterior capsule, medial and lateral gutters and subcutaneous tissues.  All extruded cement is removed and once the cement is hard the permanent 8 mm posterior stabilized rotating platform insert is placed into the tibial tray.      The wound is copiously irrigated with saline solution and the extensor mechanism closed with # 0 Stratofix suture. Flexion against gravity is 140 degrees and the patella tracks normally. Subcutaneous tissue is closed with 2.0 vicryl and subcuticular with running 4.0 Monocryl. The incision is cleaned and dried and steri-strips and a bulky sterile dressing are applied. The limb is placed into a knee immobilizer and the patient is awakened and transported to recovery in stable condition.      Please note that a surgical assistant was a medical necessity for this procedure in order to perform it in a safe and expeditious manner. Surgical assistant was necessary to retract the ligaments and vital neurovascular structures to prevent injury to them and also necessary for proper positioning of the limb to allow for anatomic placement of the prosthesis.   Dione Plover Arthurine Oleary, MD    02/07/2022, 11:58 AM

## 2022-02-07 NOTE — Anesthesia Procedure Notes (Signed)
Anesthesia Regional Block: Adductor canal block   Pre-Anesthetic Checklist: , timeout performed,  Correct Patient, Correct Site, Correct Laterality,  Correct Procedure, Correct Position, site marked,  Risks and benefits discussed,  Surgical consent,  Pre-op evaluation,  At surgeon's request and post-op pain management  Laterality: Right  Prep: chloraprep       Needles:  Injection technique: Single-shot  Needle Type: Echogenic Stimulator Needle     Needle Length: 9cm  Needle Gauge: 21     Additional Needles:   Procedures:,,,, ultrasound used (permanent image in chart),,    Narrative:  Start time: 02/07/2022 9:45 AM End time: 02/07/2022 9:50 AM Injection made incrementally with aspirations every 5 mL.  Performed by: Personally  Anesthesiologist: Effie Berkshire, MD  Additional Notes: Patient tolerated the procedure well. Local anesthetic introduced in an incremental fashion under minimal resistance after negative aspirations. No paresthesias were elicited. After completion of the procedure, no acute issues were identified and patient continued to be monitored by RN.

## 2022-02-07 NOTE — Interval H&P Note (Signed)
History and Physical Interval Note:  02/07/2022 8:27 AM  Jordan [name]  has presented today for surgery, with the diagnosis of right knee osteoarthritis.  The various methods of treatment have been discussed with the patient and family. After consideration of risks, benefits and other options for treatment, the patient has consented to  Procedure(s): TOTAL KNEE ARTHROPLASTY (Right) as a surgical intervention.  The patient's history has been reviewed, patient examined, no change in status, stable for surgery.  I have reviewed the patient's chart and labs.  Questions were answered to the patient's satisfaction.     Pilar Plate [name]

## 2022-02-07 NOTE — Discharge Instructions (Signed)
 Frank Aluisio, MD Total Joint Specialist [facility] [address] [place], [place] [address] [phone]  TOTAL KNEE REPLACEMENT POSTOPERATIVE DIRECTIONS    Knee Rehabilitation, Guidelines Following Surgery  Results after knee surgery are often greatly improved when you follow the exercise, range of motion and muscle strengthening exercises prescribed by your doctor. Safety measures are also important to protect the knee from further injury. If any of these exercises cause you to have increased pain or swelling in your knee joint, decrease the amount until you are comfortable again and slowly increase them. If you have problems or questions, call your caregiver or physical therapist for advice.   HOME CARE INSTRUCTIONS  Remove items at home which could result in a fall. This includes throw rugs or furniture in walking pathways.  ICE to the affected knee as much as tolerated. Icing helps control swelling. If the swelling is well controlled you will be more comfortable and rehab easier. Continue to use ice on the knee for pain and swelling from surgery. You may notice swelling that will progress down to the foot and ankle. This is normal after surgery. Elevate the leg when you are not up walking on it.    Continue to use the breathing machine which will help keep your temperature down. It is common for your temperature to cycle up and down following surgery, especially at night when you are not up moving around and exerting yourself. The breathing machine keeps your lungs expanded and your temperature down. Do not place pillow under the operative knee, focus on keeping the knee straight while resting  DIET You may resume your previous home diet once you are discharged from the hospital.  DRESSING / WOUND CARE / SHOWERING Keep your bulky bandage on for 2 days. On the third post-operative day you may remove the Ace bandage and gauze. There is a waterproof  adhesive bandage on your skin which will stay in place until your first follow-up appointment. Once you remove this you will not need to place another bandage You may begin showering 3 days following surgery, but do not submerge the incision under water.  ACTIVITY For the first 5 days, the key is rest and control of pain and swelling Do your home exercises twice a day starting on post-operative day 3. On the days you go to physical therapy, just do the home exercises once that day. You should rest, ice and elevate the leg for 50 minutes out of every hour. Get up and walk/stretch for 10 minutes per hour. After 5 days you can increase your activity slowly as tolerated. Walk with your walker as instructed. Use the walker until you are comfortable transitioning to a cane. Walk with the cane in the opposite hand of the operative leg. You may discontinue the cane once you are comfortable and walking steadily. Avoid periods of inactivity such as sitting longer than an hour when not asleep. This helps prevent blood clots.  You may discontinue the knee immobilizer once you are able to perform a straight leg raise while lying down. You may resume a sexual relationship in one month or when given the OK by your doctor.  You may return to work once you are cleared by your doctor.  Do not drive a car for 6 weeks or until released by your surgeon.  Do not drive while taking narcotics.  TED HOSE STOCKINGS Wear the elastic stockings on both legs for three weeks following surgery during the   day. You may remove them at night for sleeping.  WEIGHT BEARING Weight bearing as tolerated with assist device (walker, cane, etc) as directed, use it as long as suggested by your surgeon or therapist, typically at least 4-6 weeks.  POSTOPERATIVE CONSTIPATION PROTOCOL Constipation - defined medically as fewer than three stools per week and severe constipation as less than one stool per week.  One of the most common issues  patients have following surgery is constipation.  Even if you have a regular bowel pattern at home, your normal regimen is likely to be disrupted due to multiple reasons following surgery.  Combination of anesthesia, postoperative narcotics, change in appetite and fluid intake all can affect your bowels.  In order to avoid complications following surgery, here are some recommendations in order to help you during your recovery period.  Colace (docusate) - Pick up an over-the-counter form of Colace or another stool softener and take twice a day as long as you are requiring postoperative pain medications.  Take with a full glass of water daily.  If you experience loose stools or diarrhea, hold the colace until you stool forms back up. If your symptoms do not get better within 1 week or if they get worse, check with your doctor. Dulcolax (bisacodyl) - Pick up over-the-counter and take as directed by the product packaging as needed to assist with the movement of your bowels.  Take with a full glass of water.  Use this product as needed if not relieved by Colace only.  MiraLax (polyethylene glycol) - Pick up over-the-counter to have on hand. MiraLax is a solution that will increase the amount of water in your bowels to assist with bowel movements.  Take as directed and can mix with a glass of water, juice, soda, coffee, or tea. Take if you go more than two days without a movement. Do not use MiraLax more than once per day. Call your doctor if you are still constipated or irregular after using this medication for 7 days in a row.  If you continue to have problems with postoperative constipation, please contact the office for further assistance and recommendations.  If you experience "the worst abdominal pain ever" or develop nausea or vomiting, please contact the office immediatly for further recommendations for treatment.  ITCHING If you experience itching with your medications, try taking only a single pain  pill, or even half a pain pill at a time.  You can also use Benadryl over the counter for itching or also to help with sleep.   MEDICATIONS See your medication summary on the "After Visit Summary" that the nursing staff will review with you prior to discharge.  You may have some home medications which will be placed on hold until you complete the course of blood thinner medication.  It is important for you to complete the blood thinner medication as prescribed by your surgeon.  Continue your approved medications as instructed at time of discharge.  PRECAUTIONS If you experience chest pain or shortness of breath - call 911 immediately for transfer to the hospital emergency department.  If you develop a fever greater that 101 F, purulent drainage from wound, increased redness or drainage from wound, foul odor from the wound/dressing, or calf pain - CONTACT YOUR SURGEON.                                                     FOLLOW-UP APPOINTMENTS Make sure you keep all of your appointments after your operation with your surgeon and caregivers. You should call the office at the above phone number and make an appointment for approximately two weeks after the date of your surgery or on the date instructed by your surgeon outlined in the "After Visit Summary".  RANGE OF MOTION AND STRENGTHENING EXERCISES  Rehabilitation of the knee is important following a knee injury or an operation. After just a few days of immobilization, the muscles of the thigh which control the knee become weakened and shrink (atrophy). Knee exercises are designed to build up the tone and strength of the thigh muscles and to improve knee motion. Often times heat used for twenty to thirty minutes before working out will loosen up your tissues and help with improving the range of motion but do not use heat for the first two weeks following surgery. These exercises can be done on a training (exercise) mat, on the floor, on a table or on a bed.  Use what ever works the best and is most comfortable for you Knee exercises include:  Leg Lifts - While your knee is still immobilized in a splint or cast, you can do straight leg raises. Lift the leg to 60 degrees, hold for 3 sec, and slowly lower the leg. Repeat 10-20 times 2-3 times daily. Perform this exercise against resistance later as your knee gets better.  Quad and Hamstring Sets - Tighten up the muscle on the front of the thigh (Quad) and hold for 5-10 sec. Repeat this 10-20 times hourly. Hamstring sets are done by pushing the foot backward against an object and holding for 5-10 sec. Repeat as with quad sets.  Leg Slides: Lying on your back, slowly slide your foot toward your buttocks, bending your knee up off the floor (only go as far as is comfortable). Then slowly slide your foot back down until your leg is flat on the floor again. Angel Wings: Lying on your back spread your legs to the side as far apart as you can without causing discomfort.  A rehabilitation program following serious knee injuries can speed recovery and prevent re-injury in the future due to weakened muscles. Contact your doctor or a physical therapist for more information on knee rehabilitation.   POST-OPERATIVE OPIOID TAPER INSTRUCTIONS: It is important to wean off of your opioid medication as soon as possible. If you do not need pain medication after your surgery it is ok to stop day one. Opioids include: Codeine, Hydrocodone(Norco, Vicodin), Oxycodone(Percocet, oxycontin) and hydromorphone amongst others.  Long term and even short term use of opiods can cause: Increased pain response Dependence Constipation Depression Respiratory depression And more.  Withdrawal symptoms can include Flu like symptoms Nausea, vomiting And more Techniques to manage these symptoms Hydrate well Eat regular healthy meals Stay active Use relaxation techniques(deep breathing, meditating, yoga) Do Not substitute Alcohol to help  with tapering If you have been on opioids for less than two weeks and do not have pain than it is ok to stop all together.  Plan to wean off of opioids This plan should start within one week post op of your joint replacement. Maintain the same interval or time between taking each dose and first decrease the dose.  Cut the total daily intake of opioids by one tablet each day Next start to increase the time between doses. The last dose that should be eliminated is the evening dose.   IF YOU ARE TRANSFERRED TO   A SKILLED REHAB FACILITY If the patient is transferred to a skilled rehab facility following release from the hospital, a list of the current medications will be sent to the facility for the patient to continue.  When discharged from the skilled rehab facility, please have the facility set up the patient's Home Health Physical Therapy prior to being released. Also, the skilled facility will be responsible for providing the patient with their medications at time of release from the facility to include their pain medication, the muscle relaxants, and their blood thinner medication. If the patient is still at the rehab facility at time of the two week follow up appointment, the skilled rehab facility will also need to assist the patient in arranging follow up appointment in our office and any transportation needs.  MAKE SURE YOU:  Understand these instructions.  Get help right away if you are not doing well or get worse.   DENTAL ANTIBIOTICS:  In most cases prophylactic antibiotics for Dental procdeures after total joint surgery are not necessary.  Exceptions are as follows:  1. History of prior total joint infection  2. Severely immunocompromised (Organ Transplant, cancer chemotherapy, Rheumatoid biologic medications such as Humera)  3. Poorly controlled diabetes (A1C &gt; 8.0, blood glucose over 200)  If you have one of these conditions, contact your surgeon for an antibiotic  prescription, prior to your dental procedure.    Pick up stool softner and laxative for home use following surgery while on pain medications. Do not submerge incision under water. Please use good hand washing techniques while changing dressing each day. May shower starting three days after surgery. Please use a clean towel to pat the incision dry following showers. Continue to use ice for pain and swelling after surgery. Do not use any lotions or creams on the incision until instructed by your surgeon.  

## 2022-02-07 NOTE — Transfer of Care (Signed)
Immediate Anesthesia Transfer of Care Note  Patient: Jordan [name]  Procedure(s) Performed: TOTAL KNEE ARTHROPLASTY (Right: Knee)  Patient Location: PACU  Anesthesia Type:MAC and Spinal  Level of Consciousness: awake, alert , oriented and patient cooperative  Airway & Oxygen Therapy: Patient Spontanous Breathing and Patient connected to face mask oxygen  Post-op Assessment: Report given to RN, Post -op Vital signs reviewed and stable and Patient moving all extremities  Post vital signs: Reviewed and stable  Last Vitals:  Vitals Value Taken Time  BP 72/46 02/07/22 1226  Temp    Pulse 87 02/07/22 1228  Resp 13 02/07/22 1228  SpO2 100 % 02/07/22 1228  Vitals shown include unvalidated device data.  Last Pain:  Vitals:   02/07/22 1000  TempSrc:   PainSc: 0-No pain         Complications: No notable events documented.

## 2022-02-08 ENCOUNTER — Encounter (HOSPITAL_COMMUNITY): Payer: Self-pay | Admitting: Orthopedic Surgery

## 2022-02-08 DIAGNOSIS — I2510 Atherosclerotic heart disease of native coronary artery without angina pectoris: Secondary | ICD-10-CM | POA: Diagnosis not present

## 2022-02-08 DIAGNOSIS — Z85820 Personal history of malignant melanoma of skin: Secondary | ICD-10-CM | POA: Diagnosis not present

## 2022-02-08 DIAGNOSIS — I130 Hypertensive heart and chronic kidney disease with heart failure and stage 1 through stage 4 chronic kidney disease, or unspecified chronic kidney disease: Secondary | ICD-10-CM | POA: Diagnosis not present

## 2022-02-08 DIAGNOSIS — I5032 Chronic diastolic (congestive) heart failure: Secondary | ICD-10-CM | POA: Diagnosis not present

## 2022-02-08 DIAGNOSIS — J449 Chronic obstructive pulmonary disease, unspecified: Secondary | ICD-10-CM | POA: Diagnosis not present

## 2022-02-08 DIAGNOSIS — I4819 Other persistent atrial fibrillation: Secondary | ICD-10-CM | POA: Diagnosis not present

## 2022-02-08 DIAGNOSIS — N183 Chronic kidney disease, stage 3 unspecified: Secondary | ICD-10-CM | POA: Diagnosis not present

## 2022-02-08 DIAGNOSIS — E1122 Type 2 diabetes mellitus with diabetic chronic kidney disease: Secondary | ICD-10-CM | POA: Diagnosis not present

## 2022-02-08 DIAGNOSIS — M1711 Unilateral primary osteoarthritis, right knee: Secondary | ICD-10-CM | POA: Diagnosis not present

## 2022-02-08 LAB — CBC
HCT: 33.3 % — ABNORMAL LOW (ref 39.0–52.0)
Hemoglobin: 10.6 g/dL — ABNORMAL LOW (ref 13.0–17.0)
MCH: 30.2 pg (ref 26.0–34.0)
MCHC: 31.8 g/dL (ref 30.0–36.0)
MCV: 94.9 fL (ref 80.0–100.0)
Platelets: 155 10*3/uL (ref 150–400)
RBC: 3.51 MIL/uL — ABNORMAL LOW (ref 4.22–5.81)
RDW: 14.2 % (ref 11.5–15.5)
WBC: 10.8 10*3/uL — ABNORMAL HIGH (ref 4.0–10.5)
nRBC: 0 % (ref 0.0–0.2)

## 2022-02-08 LAB — BASIC METABOLIC PANEL
Anion gap: 6 (ref 5–15)
BUN: 18 mg/dL (ref 8–23)
CO2: 27 mmol/L (ref 22–32)
Calcium: 8.6 mg/dL — ABNORMAL LOW (ref 8.9–10.3)
Chloride: 105 mmol/L (ref 98–111)
Creatinine, Ser: 1.57 mg/dL — ABNORMAL HIGH (ref 0.61–1.24)
GFR, Estimated: 47 mL/min — ABNORMAL LOW (ref >60)
Glucose, Bld: 111 mg/dL — ABNORMAL HIGH (ref 70–99)
Potassium: 4.9 mmol/L (ref 3.5–5.1)
Sodium: 138 mmol/L (ref 135–145)

## 2022-02-08 MED ORDER — METHOCARBAMOL 500 MG PO TABS: 500.0000 mg | ORAL_TABLET | Freq: Four times a day (QID) | ORAL | 0 refills | Status: DC | PRN

## 2022-02-08 MED ORDER — OXYCODONE HCL 5 MG PO TABS: 5.0000 mg | ORAL_TABLET | Freq: Four times a day (QID) | ORAL | 0 refills | Status: DC | PRN

## 2022-02-08 NOTE — Progress Notes (Signed)
Physical Therapy Treatment Patient Details Name: Jordan [name] MRN: [id number] DOB: 1950/08/03 Today's Date: 02/08/2022   History of Present Illness Pt is a 71yo male presenting s/p R-TKA on 02/07/22. 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    PT Comments    Pt continues to participate well. Moderate pain in thigh with activity. Continue to recommend ST SNF rehab.    Recommendations for follow up therapy are one component of a multi-disciplinary discharge planning process, led by the attending physician.  Recommendations may be updated based on patient status, additional functional criteria and insurance authorization.  Follow Up Recommendations  Skilled nursing-short term rehab (<3 hours/day) Can patient physically be transported by private vehicle: Yes   Assistance Recommended at Discharge Frequent or constant Supervision/Assistance  Patient can return home with the following A little help with walking and/or transfers;A little help with bathing/dressing/bathroom;Assistance with cooking/housework;Help with stairs or ramp for entrance;Assist for transportation   Equipment Recommendations  None recommended by PT    Recommendations for Other Services       Precautions / Restrictions Precautions Precautions: Fall;Knee Required Braces or Orthoses: Knee Immobilizer - Right Knee Immobilizer - Right: Discontinue once straight leg raise with < 10 degree lag Restrictions Weight Bearing Restrictions: No RLE Weight Bearing: Weight bearing as tolerated     Mobility  Bed Mobility Overal bed mobility: Needs Assistance Bed Mobility: Supine to Sit, Sit to Supine     Supine to sit: Min guard, HOB elevated Sit  to supine: Min assist, HOB elevated   General bed mobility comments: Assist for R LE. Increased time. Cues provided.    Transfers Overall transfer level: Needs assistance Equipment used: Rolling walker (2 wheels) Transfers: Sit to/from Stand Sit to Stand: Min assist, From elevated surface           General transfer comment: Assist to power up, stabilize, control descent. Cues for safety, technique, hand/LE placement.    Ambulation/Gait Ambulation/Gait assistance: Min assist Gait Distance (Feet): 75 Feet Assistive device: Rolling walker (2 wheels) Gait Pattern/deviations: Step-to pattern, Trunk flexed       General Gait Details: Assist to steady pt intermittently. Slow gait speed. Pt fatigues fairly easily. Cues for safety, proper operation of RW, and RW proximity.   Stairs             Wheelchair Mobility    Modified Rankin (Stroke Patients Only)       Balance Overall balance assessment: Needs assistance         Standing balance support: Reliant on assistive device for balance, During functional activity, Bilateral upper extremity supported Standing balance-Leahy Scale: Poor                              Cognition Arousal/Alertness: Awake/alert Behavior During Therapy: WFL for tasks assessed/performed Overall Cognitive Status: Within Functional Limits for tasks assessed                                          Exercises Total Joint Exercises Ankle Circles/Pumps: AROM, Both, 10 reps Quad Sets: AROM, Right,  10 reps Heel Slides: AAROM, Right, 10 reps Hip ABduction/ADduction: AAROM, Right, 10 reps Straight Leg Raises: AAROM, Right, 10 reps Goniometric ROM: ~5-65 degrees    General Comments        Pertinent Vitals/Pain Pain Assessment Pain Assessment: 0-10 Pain Score: 5  Pain Location: R thigh Pain Descriptors / Indicators: Discomfort, Sore Pain Intervention(s): Monitored during session, Repositioned, Ice applied     Home Living                          Prior Function            PT Goals (current goals can now be found in the care plan section) Progress towards PT goals: Progressing toward goals    Frequency    7X/week      PT Plan Current plan remains appropriate    Co-evaluation              AM-PAC PT "6 Clicks" Mobility   Outcome Measure  Help needed turning from your back to your side while in a flat bed without using bedrails?: A Little Help needed moving from lying on your back to sitting on the side of a flat bed without using bedrails?: A Little Help needed moving to and from a bed to a chair (including a wheelchair)?: A Little Help needed standing up from a chair using your arms (e.g., wheelchair or bedside chair)?: A Little Help needed to walk in hospital room?: A Little Help needed climbing 3-5 steps with a railing? : A Lot 6 Click Score: 17    End of Session Equipment Utilized During Treatment: Gait belt;Right knee immobilizer Activity Tolerance: Patient tolerated treatment well Patient left: in bed;with call bell/phone within reach;with bed alarm set   PT Visit Diagnosis: Pain;Difficulty in walking, not elsewhere classified (R26.2) Pain - Right/Left: Right Pain - part of body: Knee     Time: 9371-6967 PT Time Calculation (min) (ACUTE ONLY): 14 min  Charges:  $Gait Training: 8-22 mins $Therapeutic Exercise: 8-22 mins                        Doreatha Massed, PT Acute Rehabilitation  Office: [phone] Pager: [phone]

## 2022-02-08 NOTE — Progress Notes (Signed)
Physical Therapy Treatment Patient Details Name: Jordan [name] MRN: [id number] DOB: 21-May-1951 Today's Date: 02/08/2022   History of Present Illness Pt is a 71yo male presenting s/p R-TKA on 02/07/22. 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    PT Comments    Progressing with mobility. Continues to require Min A for safe mobility. Moderate pain with activity. Continue to recommend ST SNF rehab. Will continue to progress activity as tolerated.    Recommendations for follow up therapy are one component of a multi-disciplinary discharge planning process, led by the attending physician.  Recommendations may be updated based on patient status, additional functional criteria and insurance authorization.  Follow Up Recommendations  Skilled nursing-short term rehab (<3 hours/day) Can patient physically be transported by private vehicle: Yes   Assistance Recommended at Discharge Frequent or constant Supervision/Assistance  Patient can return home with the following A little help with walking and/or transfers;A little help with bathing/dressing/bathroom;Assistance with cooking/housework;Help with stairs or ramp for entrance;Assist for transportation   Equipment Recommendations  None recommended by PT    Recommendations for Other Services       Precautions / Restrictions Precautions Precautions: Fall;Knee Required Braces or Orthoses: Knee Immobilizer - Right Knee Immobilizer - Right: Discontinue once straight leg raise with < 10 degree lag Restrictions Weight Bearing Restrictions: No RLE Weight Bearing: Weight bearing as tolerated     Mobility  Bed Mobility Overal bed mobility: Needs Assistance Bed Mobility:  Supine to Sit, Sit to Supine     Supine to sit: Min assist Sit to supine: Min assist   General bed mobility comments: Assist for R LE. Increased time. Cues provided.    Transfers Overall transfer level: Needs assistance Equipment used: Rolling walker (2 wheels) Transfers: Sit to/from Stand Sit to Stand: Min assist, From elevated surface           General transfer comment: Assist to power up, stabilize, control descent. Cues for safety, technique, hand/LE placement.    Ambulation/Gait Ambulation/Gait assistance: Min assist Gait Distance (Feet): 65 Feet Assistive device: Rolling walker (2 wheels) Gait Pattern/deviations: Step-to pattern, Trunk flexed       General Gait Details: Assist to steady pt throughout distance. Slow gait speed. Pt fatigues fairly easily. Cues for safety, proper operation and RW proximity.   Stairs             Wheelchair Mobility    Modified Rankin (Stroke Patients Only)       Balance Overall balance assessment: Needs assistance         Standing balance support: Reliant on assistive device for balance, During functional activity, Bilateral upper extremity supported Standing balance-Leahy Scale: Poor                              Cognition Arousal/Alertness: Awake/alert Behavior During Therapy: WFL for tasks assessed/performed Overall Cognitive Status: Within Functional Limits for tasks assessed                                          Exercises Total Joint Exercises Ankle Circles/Pumps: AROM, Both,  10 reps Quad Sets: AROM, Right, 10 reps Heel Slides: AAROM, Right, 10 reps Hip ABduction/ADduction: AAROM, Right, 10 reps Straight Leg Raises: AAROM, Right, 10 reps Goniometric ROM: ~5-65 degrees    General Comments        Pertinent Vitals/Pain Pain Assessment Pain Assessment: 0-10 Pain Score: 5  Pain Location: R knee Pain Descriptors / Indicators: Discomfort, Sore Pain Intervention(s):  Limited activity within patient's tolerance, Monitored during session, Repositioned, Ice applied    Home Living                          Prior Function            PT Goals (current goals can now be found in the care plan section) Progress towards PT goals: Progressing toward goals    Frequency    7X/week      PT Plan Current plan remains appropriate    Co-evaluation              AM-PAC PT "6 Clicks" Mobility   Outcome Measure  Help needed turning from your back to your side while in a flat bed without using bedrails?: A Little Help needed moving from lying on your back to sitting on the side of a flat bed without using bedrails?: A Little Help needed moving to and from a bed to a chair (including a wheelchair)?: A Little Help needed standing up from a chair using your arms (e.g., wheelchair or bedside chair)?: A Little Help needed to walk in hospital room?: A Little Help needed climbing 3-5 steps with a railing? : A Lot 6 Click Score: 17    End of Session Equipment Utilized During Treatment: Gait belt;Right knee immobilizer Activity Tolerance: Patient tolerated treatment well Patient left: in bed;with call bell/phone within reach;with bed alarm set   PT Visit Diagnosis: Pain;Difficulty in walking, not elsewhere classified (R26.2) Pain - Right/Left: Right Pain - part of body: Knee     Time: 7616-0737 PT Time Calculation (min) (ACUTE ONLY): 24 min  Charges:  $Gait Training: 8-22 mins $Therapeutic Exercise: 8-22 mins                        Doreatha Massed, PT Acute Rehabilitation  Office: [phone] Pager: [phone]

## 2022-02-08 NOTE — Progress Notes (Signed)
   Subjective: 1 Day Post-Op Procedure(s) (LRB): TOTAL KNEE ARTHROPLASTY (Right) Patient reports pain as mild.   Patient seen in rounds by Dr. Wynelle Link. Patient is well, and has had no acute complaints or problems other than pain in the right knee. Denies chest pain or SOB. Foley cath removed this AM. We will continue therapy today.  Objective: Vital signs in last 24 hours: Temp:  [96.3 F (35.7 C)-97.9 F (36.6 C)] 97.6 F (36.4 C) (08/22 0553) Pulse Rate:  [53-94] 84 (08/22 0553) Resp:  [11-32] 18 (08/22 0553) BP: (83-153)/(47-82) 116/72 (08/22 0553) SpO2:  [73 %-100 %] 97 % (08/22 0553) Weight:  [79.1 kg] 79.1 kg (08/21 0828)  Intake/Output from previous day:  Intake/Output Summary (Last 24 hours) at 02/08/2022 0753 Last data filed at 02/08/2022 0534 Gross per 24 hour  Intake 3496.47 ml  Output 2350 ml  Net 1146.47 ml     Intake/Output this shift: No intake/output data recorded.  Labs: Recent Labs    02/08/22 0340  HGB 10.6*   Recent Labs    02/08/22 0340  WBC 10.8*  RBC 3.51*  HCT 33.3*  PLT 155   Recent Labs    02/08/22 0340  NA 138  K 4.9  CL 105  CO2 27  BUN 18  CREATININE 1.57*  GLUCOSE 111*  CALCIUM 8.6*   No results for input(s): "LABPT", "INR" in the last 72 hours.  Exam: General - Patient is Alert and Oriented Extremity - Neurologically intact Neurovascular intact Sensation intact distally Dorsiflexion/Plantar flexion intact Dressing - dressing C/D/I Motor Function - intact, moving foot and toes well on exam.   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 Burt Knack and Dr. Cyndia Bent   Severe aortic stenosis    Severe aortic insufficiency, moderate to severe aortic stenosis   Sleep apnea    no longer uses cpap   Venous insufficiency    managed by ortho (Dr. Sharol Given)    Assessment/Plan: 1 Day Post-Op Procedure(s) (LRB): TOTAL KNEE ARTHROPLASTY (Right) Principal Problem:   OA (osteoarthritis) of knee Active Problems:   Osteoarthritis of right knee  Estimated body mass index is 23.65 kg/m as calculated from the following:   Height as of this encounter: 6' (1.829 m).   Weight as of this encounter: 79.1 kg. Advance diet Up with therapy D/C IV fluids  DVT Prophylaxis -  Eliquis Weight bearing as tolerated. Continue therapy.  Plan is to go to Skilled nursing facility after hospital stay. Discharge to SNF once bed available   Jordan Duty, PA-C Orthopedic Surgery [phone] 02/08/2022, 7:53 AM

## 2022-02-08 NOTE — Plan of Care (Signed)
  Problem: Education: Goal: Knowledge of the prescribed therapeutic regimen will improve Outcome: Progressing   Problem: Activity: Goal: Range of joint motion will improve Outcome: Progressing   Problem: Pain Management: Goal: Pain level will decrease with appropriate interventions Outcome: Progressing   Problem: Education: Goal: Knowledge of General Education information will improve Description: Including pain rating scale, medication(s)/side effects and non-pharmacologic comfort measures Outcome: Progressing   Problem: Safety: Goal: Ability to remain free from injury will improve Outcome: Progressing   

## 2022-02-08 NOTE — NC FL2 (Signed)
[place] LEVEL OF CARE SCREENING TOOL     IDENTIFICATION  Patient Name: Jordan [name] Birthdate: January 07, 1951 Sex: male Admission Date (Current Location): 02/07/2022  [facility] and [place] Number:  Herbalist and Address:  [facility],  [place], [place]      Provider Number: [id number]  Attending Physician Name and Address:  Gaynelle Arabian, MD  Relative Name and Phone Number:  Ishmael Holter [phone]    Current Level of Care: Hospital Recommended Level of Care: [facility] [place] Prior Approval Number:    Date Approved/Denied:   PASRR Number: [id number]  Discharge Plan: SNF    Current Diagnoses: Patient Active Problem List   Diagnosis Date Noted   OA (osteoarthritis) of knee 02/07/2022   Osteoarthritis of right knee 02/07/2022   Preoperative cardiovascular examination 11/03/2021   Sinus bradycardia 11/03/2021   Stage 3 chronic kidney disease ([place]) 08/30/2021   Aortic atherosclerosis ([place]) 08/30/2021   Chronic respiratory failure with hypoxia ([place]) 02/23/2021   Anxiety    Persistent atrial fibrillation ([place]) 01/03/2021   Open wound of left foot 12/30/2020   Macrocytic anemia 12/30/2020   Arthritis 12/30/2020   Chronic heart failure with preserved ejection fraction ([place]) 12/30/2020   Left leg cellulitis 12/29/2020   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   Diabetes mellitus without complication ([place])    COPD (chronic obstructive pulmonary disease) ([place])    CAD (coronary artery disease)    Severe aortic stenosis s/p TAVR in Jan 2022    GERD (gastroesophageal reflux disease)    Protein-calorie malnutrition, severe 03/05/2020   Tobacco abuse 03/03/2020   Pressure injury of skin 03/15/2017   Chronic venous insufficiency 04/28/2014   PAD (peripheral artery disease) ([place]) 01/21/2014   Varicose veins of lower extremities with other complications 44/06/270   Hx of TIA (transient  ischemic attack) and stroke 12/31/2013   HLD (hyperlipidemia) 12/31/2013   Severe obesity (BMI >= 40) ([place]) 12/31/2013   Essential hypertension 09/13/2012   Edema 09/13/2012    Orientation RESPIRATION BLADDER Height & Weight     Self, Time, Situation, Place  Normal Continent Weight: 174 lb 6.4 oz (79.1 kg) Height:  6' (182.9 cm)  BEHAVIORAL SYMPTOMS/MOOD NEUROLOGICAL BOWEL NUTRITION STATUS      Continent Diet (regular)  AMBULATORY STATUS COMMUNICATION OF NEEDS Skin   Limited Assist Verbally Other (Comment) (surgical incision only)                       Personal Care Assistance Level of Assistance  Dressing, Bathing Bathing Assistance: Limited assistance   Dressing Assistance: Limited assistance     Functional Limitations Info             SPECIAL CARE FACTORS FREQUENCY  PT (By licensed PT), OT (By licensed OT)     PT Frequency: 5x/wk OT Frequency: 5x/wk            Contractures Contractures Info: Not present    Additional Factors Info  Code Status, Allergies Code Status Info: Full Allergies Info: Altace (Ramipril), Codeine, Naproxen           Current Medications (02/08/2022):  This is the current hospital active medication list Current Facility-Administered Medications  Medication Dose Route Frequency Provider Last Rate Last Admin   0.9 %  sodium chloride infusion   Intravenous Continuous Jearld Lesch, PA   Stopped at 02/08/22 0650   acetaminophen (TYLENOL) tablet 1,000 mg  1,000 mg Oral Q6H  Jearld Lesch, PA   1,000 mg at 02/08/22 3536   apixaban (ELIQUIS) tablet 2.5 mg  2.5 mg Oral Q12H Jearld Lesch, PA   2.5 mg at 02/08/22 1443   atorvastatin (LIPITOR) tablet 80 mg  80 mg Oral Daily Jearld Lesch, PA   80 mg at 02/08/22 1540   bisacodyl (DULCOLAX) suppository 10 mg  10 mg Rectal Daily PRN Jearld Lesch, PA       bismuth subsalicylate (PEPTO BISMOL) 262 MG/15ML suspension 30 mL  30 mL Oral Q6H PRN Jearld Lesch, PA   30 mL at  02/08/22 0867   butalbital-acetaminophen-caffeine (FIORICET) 50-325-40 MG per tablet 1 tablet  1 tablet Oral BID PRN Jearld Lesch, PA       diphenhydrAMINE (BENADRYL) 12.5 MG/5ML elixir 12.5-25 mg  12.5-25 mg Oral Q4H PRN Jearld Lesch, PA       docusate sodium (COLACE) capsule 100 mg  100 mg Oral BID Jearld Lesch, PA   100 mg at 02/08/22 6195   doxepin (SINEQUAN) capsule 50 mg  50 mg Oral QHS PRN Jearld Lesch, PA       feeding supplement (ENSURE ENLIVE / ENSURE PLUS) liquid 237 mL  237 mL Oral BID BM Aluisio, Pilar Plate, MD       HYDROmorphone (DILAUDID) injection 0.5-1 mg  0.5-1 mg Intravenous Q2H PRN Jearld Lesch, PA       ipratropium-albuterol (DUONEB) 0.5-2.5 (3) MG/3ML nebulizer solution 3 mL  3 mL Nebulization Q6H PRN Jearld Lesch, PA       LORazepam (ATIVAN) tablet 1 mg  1 mg Oral QHS Jearld Lesch, PA   1 mg at 02/07/22 2242   menthol-cetylpyridinium (CEPACOL) lozenge 3 mg  1 lozenge Oral PRN Jearld Lesch, PA       Or   phenol (CHLORASEPTIC) mouth spray 1 spray  1 spray Mouth/Throat PRN Jearld Lesch, PA       methocarbamol (ROBAXIN) tablet 500 mg  500 mg Oral Q6H PRN Jearld Lesch, PA   500 mg at 02/08/22 0932   Or   methocarbamol (ROBAXIN) 500 mg in dextrose 5 % 50 mL IVPB  500 mg Intravenous Q6H PRN Jearld Lesch, PA       metoCLOPramide (REGLAN) tablet 5-10 mg  5-10 mg Oral Q8H PRN Jearld Lesch, PA       Or   metoCLOPramide (REGLAN) injection 5-10 mg  5-10 mg Intravenous Q8H PRN Jearld Lesch, PA       metoprolol tartrate (LOPRESSOR) tablet 37.5 mg  37.5 mg Oral Daily Jearld Lesch, PA   37.5 mg at 02/08/22 0951   mometasone-formoterol (DULERA) 200-5 MCG/ACT inhaler 2 puff  2 puff Inhalation BID PRN Jearld Lesch, PA       montelukast (SINGULAIR) tablet 10 mg  10 mg Oral PRN Jearld Lesch, PA       ondansetron (ZOFRAN) tablet 4 mg  4 mg Oral Q6H PRN Jearld Lesch, PA       Or   ondansetron (ZOFRAN) injection 4  mg  4 mg Intravenous Q6H PRN Jearld Lesch, PA       Oral care mouth rinse  15 mL Mouth Rinse PRN Aluisio, Pilar Plate, MD       oxyCODONE (Oxy IR/ROXICODONE) immediate release tablet 10-15 mg  10-15 mg Oral Q4H PRN Jearld Lesch, PA   10 mg at 02/08/22 6712   oxyCODONE (Oxy IR/ROXICODONE) immediate release tablet 5 mg  5 mg Oral Q4H PRN Jearld Lesch, PA       pantoprazole (PROTONIX) EC tablet  40 mg  40 mg Oral Daily Jearld Lesch, PA   40 mg at 02/08/22 3582   polyethylene glycol (MIRALAX / GLYCOLAX) packet 17 g  17 g Oral Daily PRN Jearld Lesch, PA       polyvinyl alcohol (LIQUIFILM TEARS) 1.4 % ophthalmic solution 1 drop  1 drop Both Eyes Daily PRN Jearld Lesch, PA       promethazine (PHENERGAN) tablet 25 mg  25 mg Oral Q8H PRN Jearld Lesch, PA       sodium phosphate (FLEET) 7-19 GM/118ML enema 1 enema  1 enema Rectal Once PRN Jearld Lesch, PA       venlafaxine XR (EFFEXOR-XR) 24 hr capsule 75 mg  75 mg Oral Daily Jearld Lesch, PA   75 mg at 02/08/22 5189     Discharge Medications: Please see discharge summary for a list of discharge medications.  Relevant Imaging Results:  Relevant Lab Results:   Additional Information SS#: [id number]  Lennart Pall, LCSW

## 2022-02-08 NOTE — TOC Initial Note (Addendum)
Transition of Care [facility]) - Initial/Assessment Note    Patient Details  Name: Jordan [name] MRN: [id number] Date of Birth: 03-25-1951  [facility] CM/SW Contact:    Lennart Pall, LCSW Phone Number: 02/08/2022, 11:11 AM  Clinical Narrative:                 Met with pt today to review dc planning needs.  Pt reports that he has already made plan for SNF discharge as he lives alone and he has already spoken with two facilities.  Pt prefers [facility] in [place].  Have begun bed search and will complete insurance auth once facility confirmed.  ADDENDUM: Pt has accepted bed at [facility] who can admit pt tomorrow.  Insurance auth begun.  Expected Discharge Plan: Skilled Nursing Facility Barriers to Discharge: No Barriers Identified   Patient Goals and CMS Choice Patient states their goals for this hospitalization and ongoing recovery are:: initally to SNF for rehab and then home      Expected Discharge Plan and Services Expected Discharge Plan: [place] In-house Referral: Clinical Social Work   Post Acute Care Choice: [place] Living arrangements for the past 2 months: [place] Expected Discharge Date: 02/08/22               DME Arranged: N/A DME Agency: NA                  Prior Living Arrangements/Services Living arrangements for the past 2 months: Single Family Home Lives with:: Self Patient language and need for interpreter reviewed:: Yes Do you feel safe going back to the place where you live?: Yes      Need for Family Participation in Patient Care: No (Comment) Care giver support system in place?: Yes (comment)   Criminal Activity/Legal Involvement Pertinent to Current Situation/Hospitalization: No - Comment as needed  Activities of Daily Living Home Assistive Devices/Equipment: Walker (specify type), Eyeglasses, Cane (specify quad or straight), Raised toilet seat with rails, Shower chair  with back, Oxygen, CBG Meter, Dentures (specify type), Blood pressure cuff ADL Screening (condition at time of admission) Patient's cognitive ability adequate to safely complete daily activities?: Yes Is the patient deaf or have difficulty hearing?: No Does the patient have difficulty seeing, even when wearing glasses/contacts?: No Does the patient have difficulty concentrating, remembering, or making decisions?: No Patient able to express need for assistance with ADLs?: Yes Does the patient have difficulty dressing or bathing?: Yes Independently performs ADLs?: No Communication: Independent Dressing (OT): Needs assistance Is this a change from baseline?: Pre-admission baseline Grooming: Needs assistance Is this a change from baseline?: Pre-admission baseline Feeding: Independent Bathing: Needs assistance Is this a change from baseline?: Pre-admission baseline Toileting: Needs assistance Is this a change from baseline?: Pre-admission baseline In/Out Bed: Needs assistance Is this a change from baseline?: Pre-admission baseline (occasional help needed) Walks in Home: Independent with device (comment) (walker) Does the patient have difficulty walking or climbing stairs?: Yes Weakness of Legs: Right (RLE) Weakness of Arms/Hands: Left (Left Shoulder)  Permission Sought/Granted Permission sought to share information with : Facility [facility] administrator granted to share info w AGENCY: SNFs        Emotional Assessment Appearance:: Appears stated age Attitude/Demeanor/Rapport: Gracious Affect (typically observed): Accepting Orientation: : Oriented to Self, Oriented to Place, Oriented to  Time, Oriented to Situation Alcohol / Substance Use: Not Applicable Psych Involvement: No (comment)  Admission diagnosis:  Osteoarthritis of right knee [M17.11] Patient Active Problem List   Diagnosis Date Noted   OA (osteoarthritis) of knee 02/07/2022   Osteoarthritis of right  knee 02/07/2022   Preoperative cardiovascular examination 11/03/2021   Sinus bradycardia 11/03/2021   Stage 3 chronic kidney disease ([place]) 08/30/2021   Aortic atherosclerosis ([place]) 08/30/2021   Chronic respiratory failure with hypoxia ([place]) 02/23/2021   Anxiety    Persistent atrial fibrillation ([place]) 01/03/2021   Open wound of left foot 12/30/2020   Macrocytic anemia 12/30/2020   Arthritis 12/30/2020   Chronic heart failure with preserved ejection fraction ([place]) 12/30/2020   Left leg cellulitis 12/29/2020   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   Diabetes mellitus without complication (HCC)    COPD (chronic obstructive pulmonary disease) ([place])    CAD (coronary artery disease)    Severe aortic stenosis s/p TAVR in Jan 2022    GERD (gastroesophageal reflux disease)    Protein-calorie malnutrition, severe 03/05/2020   Tobacco abuse 03/03/2020   Pressure injury of skin 03/15/2017   Chronic venous insufficiency 04/28/2014   PAD (peripheral artery disease) ([place]) 01/21/2014   Varicose veins of lower extremities with other complications 75/17/0017   Hx of TIA (transient ischemic attack) and stroke 12/31/2013   HLD (hyperlipidemia) 12/31/2013   Severe obesity (BMI >= 40) ([place]) 12/31/2013   Essential hypertension 09/13/2012   Edema 09/13/2012   PCP:  Janeth Rase, NP Pharmacy:   [id number]Phone: [phone]: [phone]    Social Determinants of Health (SDOH) Interventions    Readmission Risk Interventions     No data to display

## 2022-02-09 DIAGNOSIS — N183 Chronic kidney disease, stage 3 unspecified: Secondary | ICD-10-CM | POA: Diagnosis not present

## 2022-02-09 DIAGNOSIS — I2510 Atherosclerotic heart disease of native coronary artery without angina pectoris: Secondary | ICD-10-CM | POA: Diagnosis not present

## 2022-02-09 DIAGNOSIS — I4819 Other persistent atrial fibrillation: Secondary | ICD-10-CM | POA: Diagnosis not present

## 2022-02-09 DIAGNOSIS — Z79899 Other long term (current) drug therapy: Secondary | ICD-10-CM | POA: Diagnosis not present

## 2022-02-09 DIAGNOSIS — Z85820 Personal history of malignant melanoma of skin: Secondary | ICD-10-CM | POA: Diagnosis not present

## 2022-02-09 DIAGNOSIS — M1711 Unilateral primary osteoarthritis, right knee: Secondary | ICD-10-CM | POA: Diagnosis not present

## 2022-02-09 DIAGNOSIS — I5032 Chronic diastolic (congestive) heart failure: Secondary | ICD-10-CM | POA: Diagnosis not present

## 2022-02-09 DIAGNOSIS — I130 Hypertensive heart and chronic kidney disease with heart failure and stage 1 through stage 4 chronic kidney disease, or unspecified chronic kidney disease: Secondary | ICD-10-CM | POA: Diagnosis not present

## 2022-02-09 DIAGNOSIS — J449 Chronic obstructive pulmonary disease, unspecified: Secondary | ICD-10-CM | POA: Diagnosis not present

## 2022-02-09 DIAGNOSIS — E1122 Type 2 diabetes mellitus with diabetic chronic kidney disease: Secondary | ICD-10-CM | POA: Diagnosis not present

## 2022-02-09 LAB — CBC
HCT: 30.9 % — ABNORMAL LOW (ref 39.0–52.0)
Hemoglobin: 9.7 g/dL — ABNORMAL LOW (ref 13.0–17.0)
MCH: 30.3 pg (ref 26.0–34.0)
MCHC: 31.4 g/dL (ref 30.0–36.0)
MCV: 96.6 fL (ref 80.0–100.0)
Platelets: 143 10*3/uL — ABNORMAL LOW (ref 150–400)
RBC: 3.2 MIL/uL — ABNORMAL LOW (ref 4.22–5.81)
RDW: 14.3 % (ref 11.5–15.5)
WBC: 9 10*3/uL (ref 4.0–10.5)
nRBC: 0 % (ref 0.0–0.2)

## 2022-02-09 NOTE — Discharge Summary (Signed)
Physician Discharge Summary   Patient ID: Jordan [name] MRN: [id number] DOB/AGE: 1950/12/02 71 y.o.  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 Burt Knack and Dr. Cyndia Bent   Severe aortic stenosis    Severe aortic insufficiency, moderate to severe aortic stenosis   Sleep apnea    no longer uses cpap   Venous insufficiency    managed by ortho (Dr. Sharol Given)   Discharge Diagnoses:   Principal Problem:   OA (osteoarthritis) of knee Active Problems:   Osteoarthritis of right knee  Estimated body mass index is 23.65 kg/m as calculated from the following:   Height as of this encounter: 6' (1.829 m).   Weight as of this encounter: 79.1 kg.  Procedure:  Procedure(s) (LRB): TOTAL KNEE ARTHROPLASTY (Right)   Consults: None  HPI: Jordan [name] is a 71 y.o. year old male with end stage OA of his right knee with progressively worsening pain and dysfunction. He has constant pain, with activity and at rest and significant functional deficits with difficulties even with ADLs. He has had extensive non-op management including analgesics, injections of cortisone and viscosupplements, and home exercise program, but remains in significant pain with significant dysfunction. Radiographs show bone on bone arthritis lateral and  patellofemoral. He presents now for right Total Knee Arthroplasty.  Laboratory Data: Admission on 02/07/2022  Component Date Value Ref Range Status   WBC 02/08/2022 10.8 (H)  4.0 - 10.5 K/uL Final   RBC 02/08/2022 3.51 (L)  4.22 - 5.81 MIL/uL Final   Hemoglobin 02/08/2022 10.6 (L)  13.0 - 17.0 g/dL Final   HCT 02/08/2022 33.3 (L)  39.0 - 52.0 % Final   MCV 02/08/2022 94.9  80.0 - 100.0 fL Final   MCH 02/08/2022 30.2  26.0 - 34.0 pg Final   MCHC 02/08/2022 31.8  30.0 - 36.0 g/dL Final   RDW 02/08/2022 14.2  11.5 - 15.5 % Final   Platelets 02/08/2022 155  150 - 400 K/uL Final   nRBC 02/08/2022 0.0  0.0 - 0.2 % Final   Performed at [facility], [place] [address], [place], [place] [address]   Sodium 02/08/2022 138  135 - 145 mmol/L Final   Potassium 02/08/2022 4.9  3.5 - 5.1 mmol/L Final   Chloride 02/08/2022 105  98 - 111 mmol/L Final   CO2 02/08/2022 27  22 - 32 mmol/L Final   Glucose, Bld 02/08/2022 111 (H)  70 - 99 mg/dL Final   Glucose reference range applies only to samples taken after fasting for at least 8 hours.   BUN 02/08/2022 18  8 - 23 mg/dL Final   Creatinine, Ser 02/08/2022 1.57 (H)  0.61 - 1.24 mg/dL Final   Calcium 02/08/2022 8.6 (L)  8.9 - 10.3 mg/dL Final   GFR, Estimated 02/08/2022 47 (L)  >60 mL/min Final   Comment: (NOTE) Calculated using the CKD-EPI Creatinine Equation (2021)    Anion gap 02/08/2022 6  5 - 15 Final   Performed at [facility], [place] [address], [place], [place] [address]   WBC 02/09/2022 9.0  4.0 - 10.5 K/uL Final   RBC 02/09/2022 3.20 (L)  4.22 - 5.81 MIL/uL Final   Hemoglobin 02/09/2022 9.7 (L)  13.0 - 17.0 g/dL Final   HCT 02/09/2022 30.9 (L)  39.0 - 52.0 % Final   MCV 02/09/2022 96.6  80.0 - 100.0 fL Final   MCH 02/09/2022 30.3  26.0 - 34.0 pg Final   MCHC 02/09/2022 31.4  30.0 - 36.0 g/dL Final   RDW 02/09/2022 14.3  11.5 - 15.5 % Final   Platelets 02/09/2022 143 (L)  150 - 400 K/uL Final   nRBC  02/09/2022 0.0  0.0 - 0.2 % Final   Performed at [facility], [place] [address] [place], [place] [address]  Hospital Outpatient Visit on 01/26/2022  Component Date Value Ref Range Status   MRSA, PCR 01/26/2022 NEGATIVE  NEGATIVE Final   Staphylococcus aureus 01/26/2022 POSITIVE (A)  NEGATIVE Final   Comment: (NOTE) The Xpert SA Assay (FDA approved for NASAL specimens in patients 63 years of age and older), is one component of a comprehensive surveillance program. It is not intended to diagnose infection nor to guide or monitor treatment. Performed at [facility], [place] [address] [place], [place] [address]    Sodium 01/26/2022 138  135 - 145 mmol/L Final   Potassium 01/26/2022 4.7  3.5 - 5.1 mmol/L Final   Chloride 01/26/2022 106  98 - 111 mmol/L Final   CO2 01/26/2022 26  22 - 32 mmol/L Final   Glucose, Bld 01/26/2022 76  70 - 99 mg/dL Final   Glucose reference range applies only to samples taken after fasting for at least 8 hours.   BUN 01/26/2022 22  8 - 23 mg/dL Final   Creatinine, Ser 01/26/2022 1.30 (H)  0.61 - 1.24 mg/dL Final   Calcium 01/26/2022 8.6 (L)  8.9 - 10.3 mg/dL Final   GFR, Estimated 01/26/2022 59 (L)  >60 mL/min Final   Comment: (NOTE) Calculated using the CKD-EPI Creatinine Equation (2021)    Anion gap 01/26/2022 6  5 - 15 Final   Performed at [facility], [place] [address] [place], [place] [address]   WBC 01/26/2022 5.3  4.0 - 10.5 K/uL Final   RBC 01/26/2022 3.80 (L)  4.22 - 5.81 MIL/uL Final   Hemoglobin 01/26/2022 11.4 (L)  13.0 - 17.0 g/dL Final   HCT 01/26/2022 36.5 (L)  39.0 - 52.0 % Final   MCV 01/26/2022 96.1  80.0 - 100.0 fL Final   MCH 01/26/2022 30.0  26.0 - 34.0 pg Final   MCHC 01/26/2022 31.2  30.0 - 36.0 g/dL Final   RDW 01/26/2022 14.2  11.5 - 15.5 % Final   Platelets 01/26/2022 150  150 - 400 K/uL Final   nRBC 01/26/2022 0.0  0.0 - 0.2 % Final   Performed at [facility], [place] [address], [place], [place] [address]     X-Rays:No results found.  EKG: Orders placed or performed during the hospital encounter of 01/26/22   EKG 12 lead per protocol   EKG  12 lead per protocol     Hospital Course: Jordan [name] is a 71 y.o. who was admitted to [facility]. They were brought to the operating room on 02/07/2022 and underwent Procedure(s): TOTAL KNEE ARTHROPLASTY.  Patient tolerated the procedure well and was later transferred to the recovery room and then to the orthopaedic floor for postoperative care. They were given PO and IV analgesics for pain control following their surgery. They were given 24 hours of postoperative antibiotics of  Anti-infectives (From admission, onward)    Start     Dose/Rate Route Frequency Ordered Stop   02/07/22 1630  ceFAZolin (ANCEF) IVPB 2g/100 mL premix        2 g 200 mL/hr over 30 Minutes Intravenous Every 6 hours 02/07/22 1535 02/07/22 2314   02/07/22 0800  ceFAZolin (ANCEF) IVPB 2g/100 mL premix        2 g 200 mL/hr over 30 Minutes Intravenous On call to O.R. 02/07/22 0347 02/07/22 1121     and started on DVT prophylaxis in the form of  Eliquis .   PT and OT were ordered for total joint protocol. Discharge planning consulted to help with postop disposition and equipment needs. Patient had a good night on the evening of surgery. They started to get up OOB with physical therapy on POD #0. Continued to work with physical therapy into POD #2. He would benefit from short term SNF rehab. A bed was secured for him at [facility] in [place]. He was discharged to there later that day in stable condition.  Diet: Regular diet Activity: WBAT Follow-up: in 2 weeks Disposition: Skilled nursing facility Discharged Condition: stable   Discharge Instructions     Call MD / Call 911   Complete by: As directed    If you experience chest pain or shortness of breath, CALL 911 and be transported to the hospital  emergency room.  If you develope a fever above 101 F, pus (white drainage) or increased drainage or redness at the wound, or calf pain, call your surgeon's office.   Change dressing   Complete by: As directed    You may remove the bulky bandage (ACE wrap and gauze) two days after surgery. You will have an adhesive waterproof bandage underneath. Leave this in place until your first follow-up appointment.   Constipation Prevention   Complete by: As directed    Drink plenty of fluids.  Prune juice may be helpful.  You may use a stool softener, such as Colace (over the counter) 100 mg twice a day.  Use MiraLax (over the counter) for constipation as needed.   Diet - low sodium heart healthy   Complete by: As directed    Do not put a pillow under the knee. Place it under the heel.   Complete by: As directed    Driving restrictions   Complete by: As directed    No driving for two weeks   Post-operative opioid taper instructions:   Complete by: As directed    POST-OPERATIVE OPIOID TAPER INSTRUCTIONS: It is important to wean off of your opioid medication as soon as possible. If you do not need pain medication after your surgery it is ok to stop day one. Opioids include: Codeine, Hydrocodone(Norco, Vicodin), Oxycodone(Percocet, oxycontin) and hydromorphone amongst others.  Long term and even short term use of opiods can cause: Increased pain response Dependence Constipation Depression Respiratory depression And more.  Withdrawal symptoms can include Flu like symptoms Nausea, vomiting And more Techniques  to manage these symptoms Hydrate well Eat regular healthy meals Stay active Use relaxation techniques(deep breathing, meditating, yoga) Do Not substitute Alcohol to help with tapering If you have been on opioids for less than two weeks and do not have pain than it is ok to stop all together.  Plan to wean off of opioids This plan should start within one week post op of your joint  replacement. Maintain the same interval or time between taking each dose and first decrease the dose.  Cut the total daily intake of opioids by one tablet each day Next start to increase the time between doses. The last dose that should be eliminated is the evening dose.      TED hose   Complete by: As directed    Use stockings (TED hose) for three weeks on both leg(s).  You may remove them at night for sleeping.   Weight bearing as tolerated   Complete by: As directed       Allergies as of 02/09/2022       Reactions   Altace [ramipril] Other (See Comments)   Dizziness, migraine, weakness- "there is dye in it"   Codeine Itching   Naproxen Itching        Medication List     STOP taking these medications    pantoprazole 40 MG tablet Commonly known as: PROTONIX       TAKE these medications    ACETAMINOPHEN-BUTALBITAL 50-325 MG Tabs Take 1 tablet by mouth 2 (two) times daily as needed (migraines).   apixaban 5 MG Tabs tablet Commonly known as: ELIQUIS Take 1 tablet (5 mg total) by mouth 2 (two) times daily.   atorvastatin 80 MG tablet Commonly known as: LIPITOR Take 80 mg by mouth in the morning.   bismuth subsalicylate 161 WR/60AV suspension Commonly known as: PEPTO BISMOL Take 30 mLs by mouth every 6 (six) hours as needed for indigestion.   budesonide-formoterol 160-4.5 MCG/ACT inhaler Commonly known as: Symbicort Inhale 2 puffs into the lungs in the morning and at bedtime. What changed:  when to take this reasons to take this   carboxymethylcellulose 0.5 % Soln Commonly known as: REFRESH PLUS Place 1 drop into both eyes daily as needed (dry eyes).   CORICIDIN D PO Take 1 tablet by mouth in the morning.   doxepin 50 MG capsule Commonly known as: SINEQUAN Take 50 mg by mouth at bedtime as needed (sleep).   folic acid 1 MG tablet Commonly known as: FOLVITE Take 1 tablet (1 mg total) by mouth daily.   furosemide 40 MG tablet Commonly known as:  LASIX Take 1 tablet by mouth every other day alternating with 1/2 tablet every other day What changed:  how much to take how to take this when to take this   ipratropium-albuterol 0.5-2.5 (3) MG/3ML Soln Commonly known as: DUONEB Take 3 mLs by nebulization every 6 (six) hours as needed (shortness of breath/wheezing).   LORazepam 1 MG tablet Commonly known as: ATIVAN Take 1 mg by mouth at bedtime.   methocarbamol 500 MG tablet Commonly known as: ROBAXIN Take 1 tablet (500 mg total) by mouth every 6 (six) hours as needed for muscle spasms. What changed:  medication strength how much to take when to take this   Metoprolol Tartrate 75 MG Tabs Take 37.5 mg by mouth 2 (two) times daily. What changed: when to take this   montelukast 10 MG tablet Commonly known as: SINGULAIR Take 10 mg by mouth as needed (for  allergies).   multivitamin with minerals Tabs tablet Take 1 tablet by mouth daily.   nystatin cream Commonly known as: MYCOSTATIN APPLY TO AFFECTED AREA TWICE A DAY What changed: See the new instructions.   omeprazole 20 MG tablet Commonly known as: PRILOSEC OTC Take 20 mg by mouth daily.   oxyCODONE 5 MG immediate release tablet Commonly known as: Oxy IR/ROXICODONE Take 1-2 tablets (5-10 mg total) by mouth every 6 (six) hours as needed for severe pain.   promethazine 25 MG tablet Commonly known as: PHENERGAN Take 25 mg by mouth every 8 (eight) hours as needed for nausea/vomiting.   venlafaxine XR 75 MG 24 hr capsule Commonly known as: EFFEXOR-XR Take 75 mg by mouth in the morning. MUST HAVE BRAND NAME ONLY               Discharge Care Instructions  (From admission, onward)           Start     Ordered   02/08/22 0000  Weight bearing as tolerated        02/08/22 0757   02/08/22 0000  Change dressing       Comments: You may remove the bulky bandage (ACE wrap and gauze) two days after surgery. You will have an adhesive waterproof bandage underneath.  Leave this in place until your first follow-up appointment.   02/08/22 0757            Contact information for follow-up providers     Aluisio, Pilar Plate, MD. Schedule an appointment as soon as possible for a visit in 2 week(s).   Specialty: Orthopedic Surgery Contact information: [address] [place] [address] [phone]              Contact information for after-discharge care     Destination     [facility] Preferred SNF .   Service: Skilled Nursing Contact information: [place] [phone]                     Signed: R. Jaynie Bream, PA-C Orthopedic Surgery 02/09/2022, 8:00 AM

## 2022-02-09 NOTE — Progress Notes (Signed)
Physical Therapy Treatment Patient Details Name: Jordan [name] MRN: [id number] DOB: 07/11/1950 Today's Date: 02/09/2022   History of Present Illness Pt is a 71yo male presenting s/p R-TKA on 02/07/22. 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    PT Comments    Pt continues to participate well. Still having moderate thigh pain with activity. Plan is for d/c to SNF for ST rehab.    Recommendations for follow up therapy are one component of a multi-disciplinary discharge planning process, led by the attending physician.  Recommendations may be updated based on patient status, additional functional criteria and insurance authorization.  Follow Up Recommendations  Skilled nursing-short term rehab (<3 hours/day)     Assistance Recommended at Discharge Frequent or constant Supervision/Assistance  Patient can return home with the following A little help with walking and/or transfers;A little help with bathing/dressing/bathroom;Assistance with cooking/housework;Help with stairs or ramp for entrance;Assist for transportation   Equipment Recommendations  None recommended by PT    Recommendations for Other Services       Precautions / Restrictions Precautions Precautions: Fall;Knee Required Braces or Orthoses: Knee Immobilizer - Right Knee Immobilizer - Right: Discontinue once straight leg raise with < 10 degree lag Restrictions Weight Bearing Restrictions: No RLE Weight Bearing: Weight bearing as tolerated     Mobility  Bed Mobility Overal bed mobility: Needs Assistance Bed Mobility: Supine to Sit, Sit to Supine     Supine to sit: Min guard, HOB elevated Sit to supine: Min assist, HOB elevated   General  bed mobility comments: Assist for R LE. Increased time. Cues provided.    Transfers Overall transfer level: Needs assistance Equipment used: Rolling walker (2 wheels) Transfers: Sit to/from Stand Sit to Stand: Min assist, From elevated surface           General transfer comment: Assist to power up, stabilize, control descent. Cues for safety, technique, hand/LE placement.    Ambulation/Gait Ambulation/Gait assistance: Min assist Gait Distance (Feet): 85 Feet Assistive device: Rolling walker (2 wheels) Gait Pattern/deviations: Step-to pattern, Trunk flexed       General Gait Details: Assist to steady pt intermittently. Slow gait speed. Pt fatigues fairly easily. Cues for safety, proper operation of RW, and RW proximity.   Stairs             Wheelchair Mobility    Modified Rankin (Stroke Patients Only)       Balance Overall balance assessment: Needs assistance         Standing balance support: Reliant on assistive device for balance, During functional activity, Bilateral upper extremity supported Standing balance-Leahy Scale: Poor                              Cognition Arousal/Alertness: Awake/alert Behavior During Therapy: WFL for tasks assessed/performed Overall Cognitive Status: Within Functional Limits for tasks assessed                                          Exercises Total Joint Exercises Ankle Circles/Pumps: AROM, Both, 10 reps Quad Sets: AROM, Right, 10 reps Heel  Slides: AAROM, Right, 10 reps Hip ABduction/ADduction: AAROM, Right, 10 reps Straight Leg Raises: AAROM, Right, 10 reps Goniometric ROM: ~5-65 degrees    General Comments        Pertinent Vitals/Pain Pain Assessment Pain Assessment: 0-10 Pain Score: 5  Pain Location: R thigh Pain Descriptors / Indicators: Discomfort, Sore Pain Intervention(s): Monitored during session, Repositioned, Ice applied    Home Living                           Prior Function            PT Goals (current goals can now be found in the care plan section) Progress towards PT goals: Progressing toward goals    Frequency    7X/week      PT Plan Current plan remains appropriate    Co-evaluation              AM-PAC PT "6 Clicks" Mobility   Outcome Measure  Help needed turning from your back to your side while in a flat bed without using bedrails?: A Little Help needed moving from lying on your back to sitting on the side of a flat bed without using bedrails?: A Little Help needed moving to and from a bed to a chair (including a wheelchair)?: A Little Help needed standing up from a chair using your arms (e.g., wheelchair or bedside chair)?: A Little Help needed to walk in hospital room?: A Little Help needed climbing 3-5 steps with a railing? : A Lot 6 Click Score: 17    End of Session Equipment Utilized During Treatment: Gait belt;Right knee immobilizer Activity Tolerance: Patient tolerated treatment well Patient left: in bed;with call bell/phone within reach;with bed alarm set   PT Visit Diagnosis: Pain;Difficulty in walking, not elsewhere classified (R26.2) Pain - Right/Left: Right Pain - part of body: Knee     Time: 4235-3614 PT Time Calculation (min) (ACUTE ONLY): 23 min  Charges:  $Gait Training: 8-22 mins $Therapeutic Exercise: 8-22 mins                        Doreatha Massed, PT Acute Rehabilitation  Office: [phone] Pager: [phone]

## 2022-02-09 NOTE — TOC Transition Note (Signed)
Transition of Care [facility]) - CM/SW Discharge Note  Patient Details  Name: Jordan [name] MRN: [id number] Date of Birth: September 28, 1950  Transition of Care [facility] CM/SW Contact:  Sherie Don, LCSW Phone Number: 02/09/2022, 11:14 AM  Clinical Narrative: Patient has been approved for SNF. Plan auth ID # is: [id number]. Reference ID # is: [id number]. Patient has been approved for 02/09/2022-02/11/2022. CSW updated Linus Orn in admissions at [facility]. Patient will go to room 104 and the number for report is [phone]. Discharge summary, discharge orders, and SNF transfer report faxed to facility in hub.  Patient notified of insurance approval for SNF and of discharge today. Medical necessity form done; PTAR scheduled. Discharge packet completed. RN updated. TOC signing off.  Final next level of care: Skilled Nursing Facility Barriers to Discharge: No Barriers Identified  Patient Goals and CMS Choice Patient states their goals for this hospitalization and ongoing recovery are:: initally to SNF for rehab and then home CMS Medicare.gov Compare Post Acute Care list provided to:: Patient Choice offered to / list presented to : Patient  Discharge Placement Existing PASRR number confirmed : 02/08/22          Patient chooses bed at: [facility] Patient to be transferred to facility by: PTAR Patient and family notified of of transfer: 02/09/22  Discharge Plan and Services In-house Referral: Clinical Social Work Post Acute Care Choice: [facility]          DME Arranged: N/A DME Agency: NA  Readmission Risk Interventions     No data to display

## 2022-02-09 NOTE — Progress Notes (Signed)
   Subjective: 2 Days Post-Op Procedure(s) (LRB): TOTAL KNEE ARTHROPLASTY (Right) Patient seen in rounds by Dr. Wynelle Link. Patient is well, and has had no acute complaints or problems. Denies SOB or chest pain. Denies calf pain. Voiding without difficulty. Patient reports pain as mild. Worked with physical therapy yesterday and ambulated 75'.  Objective: Vital signs in last 24 hours: Temp:  [97.7 F (36.5 C)-98.5 F (36.9 C)] 98 F (36.7 C) (08/23 0529) Pulse Rate:  [40-87] 87 (08/23 0529) Resp:  [14-18] 16 (08/23 0529) BP: (123-160)/(58-74) 123/69 (08/23 0529) SpO2:  [95 %-100 %] 96 % (08/23 0529)  Intake/Output from previous day:  Intake/Output Summary (Last 24 hours) at 02/09/2022 0758 Last data filed at 02/09/2022 0600 Gross per 24 hour  Intake 720 ml  Output 2175 ml  Net -1455 ml    Intake/Output this shift: No intake/output data recorded.  Labs: Recent Labs    02/08/22 0340 02/09/22 0313  HGB 10.6* 9.7*   Recent Labs    02/08/22 0340 02/09/22 0313  WBC 10.8* 9.0  RBC 3.51* 3.20*  HCT 33.3* 30.9*  PLT 155 143*   Recent Labs    02/08/22 0340  NA 138  K 4.9  CL 105  CO2 27  BUN 18  CREATININE 1.57*  GLUCOSE 111*  CALCIUM 8.6*   No results for input(s): "LABPT", "INR" in the last 72 hours.  Exam: General - Patient is Alert and Oriented Extremity - Neurologically intact Neurovascular intact Sensation intact distally Dorsiflexion/Plantar flexion intact Dressing/Incision - clean, dry, no drainage Motor Function - intact, moving foot and toes well on exam.  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 Burt Knack and Dr. Cyndia Bent   Severe aortic stenosis    Severe aortic insufficiency, moderate to severe aortic stenosis   Sleep apnea    no longer uses cpap   Venous insufficiency    managed by ortho (Dr. Sharol Given)    Assessment/Plan: 2 Days Post-Op Procedure(s) (LRB): TOTAL KNEE ARTHROPLASTY (Right) Principal Problem:   OA (osteoarthritis) of knee Active Problems:   Osteoarthritis of right knee  Estimated body mass index is 23.65 kg/m as calculated from the following:   Height as of this encounter: 6' (1.829 m).   Weight as of this encounter: 79.1 kg.  DVT Prophylaxis -  Eliquis Weight-bearing as tolerated.  Continue with physical therapy. Expected discharge today to SNF. Scripts have been printed for discharge.   Will follow-up in clinic in 2 weeks.  Rainey Pines, PA-C Orthopedic Surgery [phone] 02/09/2022, 7:58 AM

## 2022-02-09 NOTE — Plan of Care (Signed)
  Problem: Activity: Goal: Range of joint motion will improve Outcome: Progressing   Problem: Elimination: Goal: Will not experience complications related to urinary retention Outcome: Progressing   Problem: Skin Integrity: Goal: Risk for impaired skin integrity will decrease Outcome: Progressing

## 2022-02-11 NOTE — Progress Notes (Signed)
CSW received call from [facility] with [facility] that the medication list was not visible on the patient's discharge summary. Discharge summary was sent again to [facility] and CSW confirmed the medication list is now visible.

## 2022-02-24 ENCOUNTER — Other Ambulatory Visit (HOSPITAL_COMMUNITY): Payer: Self-pay | Admitting: Physician Assistant

## 2022-02-24 DIAGNOSIS — I2510 Atherosclerotic heart disease of native coronary artery without angina pectoris: Secondary | ICD-10-CM | POA: Diagnosis not present

## 2022-02-24 DIAGNOSIS — I739 Peripheral vascular disease, unspecified: Secondary | ICD-10-CM | POA: Diagnosis not present

## 2022-02-24 DIAGNOSIS — J449 Chronic obstructive pulmonary disease, unspecified: Secondary | ICD-10-CM | POA: Diagnosis not present

## 2022-02-24 DIAGNOSIS — Z471 Aftercare following joint replacement surgery: Secondary | ICD-10-CM | POA: Diagnosis not present

## 2022-02-24 DIAGNOSIS — I5032 Chronic diastolic (congestive) heart failure: Secondary | ICD-10-CM | POA: Diagnosis not present

## 2022-02-24 DIAGNOSIS — I872 Venous insufficiency (chronic) (peripheral): Secondary | ICD-10-CM | POA: Diagnosis not present

## 2022-02-24 DIAGNOSIS — I4891 Unspecified atrial fibrillation: Secondary | ICD-10-CM | POA: Diagnosis not present

## 2022-02-24 DIAGNOSIS — M47812 Spondylosis without myelopathy or radiculopathy, cervical region: Secondary | ICD-10-CM | POA: Diagnosis not present

## 2022-02-24 DIAGNOSIS — I110 Hypertensive heart disease with heart failure: Secondary | ICD-10-CM | POA: Diagnosis not present

## 2022-02-25 NOTE — Telephone Encounter (Signed)
Prescription refill request for Eliquis received. Indication:Afib Last office visit:5/23 Scr:1.5 Age: 71 Weight:79.1 kg  Prescription refilled

## 2022-02-26 ENCOUNTER — Other Ambulatory Visit: Payer: Self-pay | Admitting: Physician Assistant

## 2022-03-04 DIAGNOSIS — J432 Centrilobular emphysema: Secondary | ICD-10-CM | POA: Diagnosis not present

## 2022-03-04 DIAGNOSIS — I10 Essential (primary) hypertension: Secondary | ICD-10-CM | POA: Diagnosis not present

## 2022-03-04 DIAGNOSIS — I4891 Unspecified atrial fibrillation: Secondary | ICD-10-CM | POA: Diagnosis not present

## 2022-03-04 DIAGNOSIS — I700 Atherosclerosis of aorta: Secondary | ICD-10-CM | POA: Diagnosis not present

## 2022-03-04 DIAGNOSIS — E1142 Type 2 diabetes mellitus with diabetic polyneuropathy: Secondary | ICD-10-CM | POA: Diagnosis not present

## 2022-03-04 DIAGNOSIS — R269 Unspecified abnormalities of gait and mobility: Secondary | ICD-10-CM | POA: Diagnosis not present

## 2022-03-04 DIAGNOSIS — I87319 Chronic venous hypertension (idiopathic) with ulcer of unspecified lower extremity: Secondary | ICD-10-CM | POA: Diagnosis not present

## 2022-03-04 DIAGNOSIS — I2584 Coronary atherosclerosis due to calcified coronary lesion: Secondary | ICD-10-CM | POA: Diagnosis not present

## 2022-03-04 DIAGNOSIS — I5032 Chronic diastolic (congestive) heart failure: Secondary | ICD-10-CM | POA: Diagnosis not present

## 2022-03-12 ENCOUNTER — Emergency Department (HOSPITAL_BASED_OUTPATIENT_CLINIC_OR_DEPARTMENT_OTHER): Payer: Medicare PPO | Admitting: Radiology

## 2022-03-12 ENCOUNTER — Encounter (HOSPITAL_BASED_OUTPATIENT_CLINIC_OR_DEPARTMENT_OTHER): Payer: Self-pay | Admitting: *Deleted

## 2022-03-12 ENCOUNTER — Other Ambulatory Visit: Payer: Self-pay

## 2022-03-12 ENCOUNTER — Emergency Department (HOSPITAL_BASED_OUTPATIENT_CLINIC_OR_DEPARTMENT_OTHER)
Admission: EM | Admit: 2022-03-12 | Discharge: 2022-03-15 | Disposition: A | Discharge status: Home / Self Care | Payer: Medicare PPO | Attending: Emergency Medicine | Admitting: Emergency Medicine

## 2022-03-12 ENCOUNTER — Emergency Department (HOSPITAL_BASED_OUTPATIENT_CLINIC_OR_DEPARTMENT_OTHER): Payer: Medicare PPO

## 2022-03-12 DIAGNOSIS — Z471 Aftercare following joint replacement surgery: Secondary | ICD-10-CM | POA: Diagnosis not present

## 2022-03-12 DIAGNOSIS — G8918 Other acute postprocedural pain: Secondary | ICD-10-CM | POA: Diagnosis not present

## 2022-03-12 DIAGNOSIS — Z7901 Long term (current) use of anticoagulants: Secondary | ICD-10-CM | POA: Insufficient documentation

## 2022-03-12 DIAGNOSIS — M25561 Pain in right knee: Secondary | ICD-10-CM | POA: Insufficient documentation

## 2022-03-12 DIAGNOSIS — M79661 Pain in right lower leg: Secondary | ICD-10-CM | POA: Insufficient documentation

## 2022-03-12 DIAGNOSIS — Z96651 Presence of right artificial knee joint: Secondary | ICD-10-CM | POA: Diagnosis not present

## 2022-03-12 DIAGNOSIS — J449 Chronic obstructive pulmonary disease, unspecified: Secondary | ICD-10-CM | POA: Diagnosis not present

## 2022-03-12 DIAGNOSIS — M7989 Other specified soft tissue disorders: Secondary | ICD-10-CM | POA: Diagnosis not present

## 2022-03-12 DIAGNOSIS — R609 Edema, unspecified: Secondary | ICD-10-CM | POA: Diagnosis not present

## 2022-03-12 LAB — BASIC METABOLIC PANEL
Anion gap: 7 (ref 5–15)
BUN: 17 mg/dL (ref 8–23)
CO2: 29 mmol/L (ref 22–32)
Calcium: 8.8 mg/dL — ABNORMAL LOW (ref 8.9–10.3)
Chloride: 103 mmol/L (ref 98–111)
Creatinine, Ser: 1.13 mg/dL (ref 0.61–1.24)
GFR, Estimated: >60 mL/min (ref >60)
Glucose, Bld: 72 mg/dL (ref 70–99)
Potassium: 4.1 mmol/L (ref 3.5–5.1)
Sodium: 139 mmol/L (ref 135–145)

## 2022-03-12 LAB — CBC WITH DIFFERENTIAL/PLATELET
Abs Immature Granulocytes: 0.01 10*3/uL (ref 0.00–0.07)
Basophils Absolute: 0 10*3/uL (ref 0.0–0.1)
Basophils Relative: 0 %
Eosinophils Absolute: 0.2 10*3/uL (ref 0.0–0.5)
Eosinophils Relative: 5 %
HCT: 32.2 % — ABNORMAL LOW (ref 39.0–52.0)
Hemoglobin: 9.9 g/dL — ABNORMAL LOW (ref 13.0–17.0)
Immature Granulocytes: 0 %
Lymphocytes Relative: 23 %
Lymphs Abs: 1.1 10*3/uL (ref 0.7–4.0)
MCH: 28.1 pg (ref 26.0–34.0)
MCHC: 30.7 g/dL (ref 30.0–36.0)
MCV: 91.5 fL (ref 80.0–100.0)
Monocytes Absolute: 0.6 10*3/uL (ref 0.1–1.0)
Monocytes Relative: 13 %
Neutro Abs: 2.8 10*3/uL (ref 1.7–7.7)
Neutrophils Relative %: 59 %
Platelets: 235 10*3/uL (ref 150–400)
RBC: 3.52 MIL/uL — ABNORMAL LOW (ref 4.22–5.81)
RDW: 15.7 % — ABNORMAL HIGH (ref 11.5–15.5)
WBC: 4.8 10*3/uL (ref 4.0–10.5)
nRBC: 0 % (ref 0.0–0.2)

## 2022-03-12 MED ORDER — OXYCODONE HCL 5 MG PO TABS: 5.0000 mg | ORAL_TABLET | Freq: Four times a day (QID) | ORAL | Status: DC | PRN

## 2022-03-12 MED ORDER — LORAZEPAM 1 MG PO TABS
1.0000 mg | ORAL_TABLET | Freq: Every day | ORAL | Status: DC
  Administered 2022-03-12 – 2022-03-14 (×3): 1 mg via ORAL
  Filled 2022-03-12 (×3): qty 1

## 2022-03-12 MED ORDER — DOXEPIN HCL 50 MG PO CAPS: 50.0000 mg | ORAL_CAPSULE | Freq: Every evening | ORAL | Status: DC | PRN

## 2022-03-12 MED ORDER — APIXABAN 2.5 MG PO TABS
5.0000 mg | ORAL_TABLET | Freq: Two times a day (BID) | ORAL | Status: DC
  Administered 2022-03-12 – 2022-03-15 (×6): 5 mg via ORAL
  Filled 2022-03-12 (×6): qty 2

## 2022-03-12 MED ORDER — ATORVASTATIN CALCIUM 40 MG PO TABS
80.0000 mg | ORAL_TABLET | Freq: Every morning | ORAL | Status: DC
Start: 2022-03-13 — End: 2022-03-15
  Administered 2022-03-13 – 2022-03-15 (×3): 80 mg via ORAL
  Filled 2022-03-12 (×3): qty 2

## 2022-03-12 MED ORDER — VENLAFAXINE HCL ER 75 MG PO CP24: 75.0000 mg | ORAL_CAPSULE | Freq: Every morning | ORAL | Status: DC

## 2022-03-12 MED ORDER — METHOCARBAMOL 500 MG PO TABS
500.0000 mg | ORAL_TABLET | Freq: Four times a day (QID) | ORAL | Status: DC | PRN
Start: 2022-03-12 — End: 2022-03-15

## 2022-03-12 MED ORDER — FUROSEMIDE 40 MG PO TABS
20.0000 mg | ORAL_TABLET | Freq: Every day | ORAL | Status: DC
  Administered 2022-03-13 – 2022-03-15 (×3): 20 mg via ORAL
  Filled 2022-03-12 (×3): qty 1

## 2022-03-12 MED ORDER — ACETAMINOPHEN 325 MG PO TABS: 650.0000 mg | ORAL_TABLET | ORAL | Status: DC | PRN

## 2022-03-12 MED ORDER — OXYCODONE HCL 5 MG PO TABS
5.0000 mg | ORAL_TABLET | Freq: Once | ORAL | Status: AC
  Administered 2022-03-12: 5 mg via ORAL
  Filled 2022-03-12: qty 1

## 2022-03-12 NOTE — ED Provider Notes (Signed)
[facility] EMERGENCY DEPT Provider Note   CSN: [id number] Arrival date & time: 03/12/22  1218     History  Chief Complaint  Patient presents with   Leg Pain    Jordan [name] is a 71 y.o. male.  Here with right knee pain.  Had knee replacement several weeks ago on this knee.  He is back home after being at nursing home.  History of COPD, TAVR, atrial fibrillation on blood thinner.  History of venous insufficiency.  He has been getting around with walker.  He is increased his physical activity.  He has increased pain and swelling to the right knee and right lower leg.  He is concerned about a blood clot.  He states that it feels warm around his white knee.  Denies any fevers or chills.  No new trauma or falls.  The history is provided by the patient.       Home Medications Prior to Admission medications   Medication Sig Start Date End Date Taking? Authorizing Provider  ACETAMINOPHEN-BUTALBITAL 50-325 MG TABS Take 1 tablet by mouth 2 (two) times daily as needed (migraines).    [provider]  atorvastatin (LIPITOR) 80 MG tablet Take 80 mg by mouth in the morning.    [provider]  bismuth subsalicylate (PEPTO BISMOL) 262 MG/15ML suspension Take 30 mLs by mouth every 6 (six) hours as needed for indigestion.    [provider]  budesonide-formoterol (SYMBICORT) 160-4.5 MCG/ACT inhaler Inhale 2 puffs into the lungs in the morning and at bedtime. Patient taking differently: Inhale 2 puffs into the lungs 2 (two) times daily as needed (sob/wheezing). 03/12/20   Aline August, MD  carboxymethylcellulose (REFRESH PLUS) 0.5 % SOLN Place 1 drop into both eyes daily as needed (dry eyes).    [provider]  Chlorphen-Pseudoephed-APAP (CORICIDIN D PO) Take 1 tablet by mouth in the morning.    [provider]  doxepin (SINEQUAN) 50 MG capsule Take 50 mg by mouth at bedtime as needed (sleep). 09/21/21   [provider]   ELIQUIS 5 MG TABS tablet TAKE 1 TABLET BY MOUTH TWICE A DAY 02/25/22   Eileen Stanford, PA-C  folic acid (FOLVITE) 1 MG tablet Take 1 tablet (1 mg total) by mouth daily. 11/04/14   [name], MD  furosemide (LASIX) 40 MG tablet Take 1 tablet by mouth every other day alternating with 1/2 tablet every other day Patient taking differently: Take 20-40 mg by mouth as directed. Take 1 tablet by mouth every other day alternating with 1/2 tablet every other day 03/16/21   [name] T, PA-C  ipratropium-albuterol (DUONEB) 0.5-2.5 (3) MG/3ML SOLN Take 3 mLs by nebulization every 6 (six) hours as needed (shortness of breath/wheezing). 03/12/20   Aline August, MD  LORazepam (ATIVAN) 1 MG tablet Take 1 mg by mouth at bedtime. 02/22/21   [provider]  methocarbamol (ROBAXIN) 500 MG tablet Take 1 tablet (500 mg total) by mouth every 6 (six) hours as needed for muscle spasms. 02/08/22   Edmisten, Kristie L, PA  Metoprolol Tartrate 75 MG TABS Take 37.5 mg by mouth 2 (two) times daily. Patient taking differently: Take 37.5 mg by mouth in the morning. 11/03/21 02/07/22  [name] T, PA-C  montelukast (SINGULAIR) 10 MG tablet Take 10 mg by mouth as needed (for allergies). 02/07/20   [provider]  Multiple Vitamin (MULTIVITAMIN WITH MINERALS) TABS Take 1 tablet by mouth daily.    [provider]  nystatin  cream (MYCOSTATIN) APPLY TO AFFECTED AREA TWICE A DAY Patient taking differently: Apply 1 Application topically 2 (two) times daily as needed for dry skin. 07/20/21   Eileen Stanford, PA-C  omeprazole (PRILOSEC OTC) 20 MG tablet Take 20 mg by mouth daily.    [provider]  oxyCODONE (OXY IR/ROXICODONE) 5 MG immediate release tablet Take 1-2 tablets (5-10 mg total) by mouth every 6 (six) hours as needed for severe pain. 02/08/22   Edmisten, Ok Anis, PA  promethazine (PHENERGAN) 25 MG tablet Take 25 mg by mouth every 8 (eight) hours as needed for nausea/vomiting.  05/14/20   [provider]  venlafaxine XR (EFFEXOR-XR) 75 MG 24 hr capsule Take 75 mg by mouth in the morning. MUST HAVE BRAND NAME ONLY    [provider]      Allergies    Altace [ramipril], Codeine, and Naproxen    Review of Systems   Review of Systems  Physical Exam Updated Vital Signs BP 121/61 (BP Location: Right Arm)   Pulse 89   Temp 97.6 F (36.4 C) (Oral)   Resp 16   SpO2 (!) 62%  Physical Exam Vitals and nursing note reviewed.  Constitutional:      General: He is not in acute distress.    Appearance: He is well-developed. He is not ill-appearing.  HENT:     Head: Normocephalic and atraumatic.  Eyes:     Extraocular Movements: Extraocular movements intact.     Conjunctiva/sclera: Conjunctivae normal.     Pupils: Pupils are equal, round, and reactive to light.  Cardiovascular:     Rate and Rhythm: Normal rate and regular rhythm.     Pulses: Normal pulses.     Heart sounds: No murmur heard. Pulmonary:     Effort: Pulmonary effort is normal. No respiratory distress.     Breath sounds: Normal breath sounds.  Abdominal:     Palpations: Abdomen is soft.     Tenderness: There is no abdominal tenderness.  Musculoskeletal:        General: Swelling and tenderness present.     Cervical back: Normal range of motion and neck supple.     Right lower leg: Edema present.     Left lower leg: Edema present.     Comments: 2+ pitting edema bilaterally with venous stasis changes bilaterally, may be slightly more erythema in the right lower leg but he has good range of motion of the right knee without any significant pain  Skin:    General: Skin is warm and dry.     Capillary Refill: Capillary refill takes less than 2 seconds.  Neurological:     General: No focal deficit present.     Mental Status: He is alert.     Sensory: No sensory deficit.     Motor: No weakness.  Psychiatric:        Mood and Affect: Mood normal.     ED Results / Procedures /  Treatments   Labs (all labs ordered are listed, but only abnormal results are displayed) Labs Reviewed  CBC WITH DIFFERENTIAL/PLATELET - Abnormal; Notable for the following components:      Result Value   RBC 3.52 (*)    Hemoglobin 9.9 (*)    HCT 32.2 (*)    RDW 15.7 (*)    All other components within normal limits  BASIC METABOLIC PANEL - Abnormal; Notable for the following components:   Calcium 8.8 (*)    All other components within normal  limits    EKG None  Radiology DG Knee Complete 4 Views Right  Result Date: 03/12/2022 CLINICAL DATA:  Pain, recent right knee arthroplasty EXAM: RIGHT KNEE - COMPLETE 4+ VIEW COMPARISON:  12/27/2020 FINDINGS: There is interval right knee arthroplasty. There is no significant effusion in suprapatellar bursa. There are smooth marginated calcifications along the course of infrapatellar tendon. No recent fracture or dislocation is seen. Arterial calcifications are seen in soft tissues. IMPRESSION: Previous right knee arthroplasty. No recent fracture or dislocation is seen. There is no significant effusion. Smooth marginated calcifications along the anterior aspect of the knee may be related to previous trauma and previous surgery. Electronically Signed   By: Elmer Picker M.D.   On: 03/12/2022 13:32    Procedures Procedures    Medications Ordered in ED Medications  oxyCODONE (Oxy IR/ROXICODONE) immediate release tablet 5 mg (has no administration in time range)    ED Course/ Medical Decision Making/ A&P                           Medical Decision Making Amount and/or Complexity of Data Reviewed Labs: ordered. Radiology: ordered.  Risk Prescription drug management.   Jordan [name] is here with right knee pain.  Had knee replacement 3 weeks ago.  He has noticed increased swelling and pain to the right lower leg.  He is back at home after being at nursing home.  He is on Eliquis already for A-fiB.  History of venous insufficiency as  well.  Differential diagnosis is likely some inflammation given his increased physical activity recently, seems less likely to be infectious process, could be venous insufficiency, seems less likely to be DVT.  Will get basic labs including CBC and BMP.  We will check DVT study as well as x-ray.  Clinically he has no fever.  He is got good range of motion of the right knee without any significant discomfort.  There is no purulent drainage or major erythema around the kneecap.  Seems less likely to be infectious process.  He is on blood thinner and would be surprised if DVT but will get ultrasound.  Suspect this could be ongoing inflammation as he is increased his activity recently as well.  Could be slightly worse venous insufficiency as well.  He has no shortness of breath.  I am not concerned about major fluid retention but will check basic labs.  We will reach out to his orthopedic team.  Sounds like he has follow-up with them in 3 days.  Patient per my review of labs has no significant anemia, electrolyte abnormality or leukocytosis.  X-ray negative for fracture or malalignment or joint effusion.  DVT study is pending.  Talked with Dr. Lyla Glassing with orthopedics and overall he was able to review images and pictures of his knee on the chart.  He shares my view that there is likely no infectious process.  Overall he is struggling with pain since being home from nursing facility.  He does not feel like he has enough social support to take care of himself at home where he lives by himself.  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  I will have him transferred to [facility] to work with case management and social work and physical therapy to help come up with a better safety plan for him at home as patient does  not feel comfortable leaving.  Talked with Dr. Ernesto Rutherford at [facility] long emergency department who accepts the  patient in transfer to wait for that assessment.  DVT study is pending.  We will have emergency medicine team follow that up.  We will order his home medications.  This chart was dictated using voice recognition software.  Despite best efforts to proofread,  errors can occur which can change the documentation meaning.         Final Clinical Impression(s) / ED Diagnoses Final diagnoses:  Acute pain of right knee  Postoperative pain    Rx / DC Orders ED Discharge Orders     None         Lennice Sites, DO 03/12/22 1506

## 2022-03-12 NOTE — ED Notes (Signed)
Arrived to [facility] ED room 12 via [facility].

## 2022-03-12 NOTE — ED Triage Notes (Addendum)
Total knee replacement 3 weeks---large amount of swelling noted to right leg with discoloration and warmth. Had PT at [facility] and is now at home. PCP looked at leg a week ago---was okay then. Ortho sent patient here to be seen. States severe pain with ambulation. Next ortho appt Tuesday. Still taking eloquis. Using walker at home.

## 2022-03-12 NOTE — ED Notes (Signed)
SpO2 probe placed on earlobe, with good pleth wave, has poor circulation in hands

## 2022-03-12 NOTE — ED Notes (Signed)
Report given to [facility] 

## 2022-03-12 NOTE — TOC Initial Note (Signed)
Transition of Care [facility]) - Initial/Assessment Note    Patient Details  Name: Jordan [name] MRN: [id number] Date of Birth: 1950/07/05  Transition of Care [facility]) CM/SW Contact:    Verdell Carmine, RN Phone Number: 03/12/2022, 5:37 PM  Clinical Narrative:                  Patient lives alone and was just discharged from SNF.  His orthopedic group sent him to the ED for evaluation. He is having trouble caring for himself. He had a knee replacement  a month ago, he has been home alone for two weeks. Unsure if [facility] was set up by facility.   PT order placed for the AM, patient will board in ED until he can be evaluated by PT       Patient Goals and CMS Choice        Expected Discharge Plan and Services      Possible placement. PT orders in place to evaluate in [facility] meds ordered by ED provider                                          Prior Living Arrangements/Services                       Activities of Daily Living      Permission Sought/Granted                  Emotional Assessment              Admission diagnosis:  Leg pain Patient Active Problem List   Diagnosis Date Noted   OA (osteoarthritis) of knee 02/07/2022   Osteoarthritis of right knee 02/07/2022   Preoperative cardiovascular examination 11/03/2021   Sinus bradycardia 11/03/2021   Stage 3 chronic kidney disease ([place]) 08/30/2021   Aortic atherosclerosis ([place]) 08/30/2021   Chronic respiratory failure with hypoxia ([place]) 02/23/2021   Anxiety    Persistent atrial fibrillation ([place]) 01/03/2021   Open wound of left foot 12/30/2020   Macrocytic anemia 12/30/2020   Arthritis 12/30/2020   Chronic heart failure with preserved ejection fraction ([place]) 12/30/2020   Left leg cellulitis 12/29/2020   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   Diabetes mellitus without complication (HCC)    COPD (chronic obstructive pulmonary disease) ([place])    CAD (coronary artery  disease)    Severe aortic stenosis s/p TAVR in Jan 2022    GERD (gastroesophageal reflux disease)    Protein-calorie malnutrition, severe 03/05/2020   Tobacco abuse 03/03/2020   Pressure injury of skin 03/15/2017   Chronic venous insufficiency 04/28/2014   PAD (peripheral artery disease) ([place]) 01/21/2014   Varicose veins of lower extremities with other complications 08/65/7846   Hx of TIA (transient ischemic attack) and stroke 12/31/2013   HLD (hyperlipidemia) 12/31/2013   Severe obesity (BMI >= 40) ([place]) 12/31/2013   Essential hypertension 09/13/2012   Edema 09/13/2012   PCP:  [name], MD Pharmacy:   [id number]: [phone]: [phone]    Social Determinants of Health (SDOH) Interventions    Readmission Risk Interventions     No data to display

## 2022-03-12 NOTE — ED Notes (Signed)
Patient provided with crackers and a soda per request

## 2022-03-12 NOTE — ED Provider Notes (Signed)
Patient was transferred here by Dr. Ronnald Nian from [facility] pending PT and social work evaluation.  In short, this is a 71 year old male with past medical history of right knee replacement about 1 month ago, COPD, TAVR, A-fib on Eliquis presenting to the emergency department with acutely worsening right knee and shin pain.  He states that he has been home from rehab for about 2 weeks and initially was doing well but over the last week his pain significantly worsened and he feels as though he is unable to care for himself at home.  He had work-up performed at [facility] ED and x-ray showed hardware in place.  He had DVT study performed that showed no evidence of DVT.  Low concern for infectious etiology.  Upon my evaluation, the patient is awake and alert and well-appearing.  He has chronic skin changes on his bilateral shins, no tenderness to palpation.  Patient is medically cleared for PT and social work evaluation.  His home medications have been ordered.   Ottie Glazier, DO 03/12/22 1700

## 2022-03-12 NOTE — ED Notes (Signed)
Patient resting with eyes closed. Respirations even and unlabored.

## 2022-03-12 NOTE — ED Notes (Signed)
Patient moved to hospital bed for comfort.  Provided with new sheets and blankets.  Patient updated on plan of care

## 2022-03-13 MED ORDER — PROMETHAZINE HCL 25 MG PO TABS
25.0000 mg | ORAL_TABLET | Freq: Three times a day (TID) | ORAL | Status: DC | PRN
  Administered 2022-03-13 – 2022-03-14 (×2): 25 mg via ORAL
  Filled 2022-03-13 (×2): qty 1

## 2022-03-13 MED ORDER — ONDANSETRON 4 MG PO TBDP
4.0000 mg | ORAL_TABLET | Freq: Once | ORAL | Status: AC
  Administered 2022-03-13: 4 mg via ORAL
  Filled 2022-03-13: qty 1

## 2022-03-13 MED ORDER — DOXYCYCLINE HYCLATE 100 MG PO TABS
100.0000 mg | ORAL_TABLET | Freq: Two times a day (BID) | ORAL | Status: DC
  Administered 2022-03-13 – 2022-03-15 (×4): 100 mg via ORAL
  Filled 2022-03-13 (×4): qty 1

## 2022-03-13 MED ORDER — ALUM & MAG HYDROXIDE-SIMETH 200-200-20 MG/5ML PO SUSP
30.0000 mL | Freq: Four times a day (QID) | ORAL | Status: DC | PRN
  Administered 2022-03-13 – 2022-03-14 (×3): 30 mL via ORAL
  Filled 2022-03-13 (×3): qty 30

## 2022-03-13 NOTE — ED Notes (Addendum)
Patient repositioned in bed. Provided with soda and ice upon request. He is using the phone at this time.

## 2022-03-13 NOTE — Evaluation (Signed)
Physical Therapy Evaluation Patient Details Name: Jordan [name] MRN: [id number] DOB: 1950-12-01 Today's Date: 03/13/2022  History of Present Illness  Pt is a 71yo male presenting to ED after being seen at [facility] due to difficulty standing and pain, swelling in R knee and lower leg area.  Pt with recent s/p R-TKA on 02/07/22, went to [facility] for rehab and recetnly discharged home for the last 2 weeks. He had begun receiving HHPT and HHOT and was doing well at home until Wed ( 4 days ago). Pain in R lower elg ( shin area) became unbearable to be able to mobilize and care for himslef. Pt reports his PCP called in anitbiotic ( doxicylin) howevr he was only able to take it Friday before he had to come to ED on Saturday. 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  Clinical Impression  Pt reports he was doing well until this past Wed ( about 4 days ago) when his pain and swelling in his R LE became worse that it was really affecting his mobility and safety at home. He has had bouts of cellulitis ion the past and has concern that this is what is occurring again. He has limited mobility at this time requiring assistance for LE movement and ambulation. Pt feels and I agree his is challenged to take car of himself at this time. Will need to resolve the reason for the LE pain, it seems the TKA R is not as swollen and tender to touch as the lower calf and anterior shin area. Recommend return to ST-SNF at this time, was recently at [facility] he would refer this if possible. Will continue to follow while here.        Recommendations for follow up therapy are one component of a multi-disciplinary discharge planning process, led by the  attending physician.  Recommendations may be updated based on patient status, additional functional criteria and insurance authorization.  Follow Up Recommendations Skilled nursing-short term rehab (<3 hours/day) Can patient physically be transported by private vehicle: Yes    Assistance Recommended at Discharge Frequent or constant Supervision/Assistance  Patient can return home with the following  A little help with walking and/or transfers;A little help with bathing/dressing/bathroom;Assistance with cooking/housework;Assist for transportation;Help with stairs or ramp for entrance    Equipment Recommendations None recommended by PT  Recommendations for Other Services       Functional Status Assessment Patient has had a recent decline in their functional status and demonstrates the ability to make significant improvements in function in a reasonable and predictable amount of time.     Precautions / Restrictions Precautions Precautions: Knee Precaution Comments: recent R TKA so no pillow under knee while resting Restrictions Weight Bearing Restrictions: No      Mobility  Bed Mobility Overal bed mobility: Needs Assistance Bed Mobility: Supine to Sit, Sit to Supine     Supine to sit: Min assist Sit to supine: Min assist   General bed mobility comments: pt required some assit with his LEs and some with his upper body. Especially sit to supine.    Transfers Overall transfer level: Needs assistance Equipment used: Rolling walker (2 wheels) Transfers: Sit to/from Stand Sit to Stand: Min assist  General transfer comment: pt very slow and guarded due to pain and difficuty with initally weight bearing on teh R LE    Ambulation/Gait Ambulation/Gait assistance: Min guard Gait Distance (Feet): 30 Feet Assistive device: Rolling walker (2 wheels) Gait Pattern/deviations: Step-to pattern Gait velocity: slow     General Gait Details: Step to pattern guarded due to  pain with R LE wight bearing . Very little knee flexion on RL siwth swing phase, limited speed due to pain adn guarding RLE with each step  Stairs            Wheelchair Mobility    Modified Rankin (Stroke Patients Only)       Balance Overall balance assessment: Needs assistance Sitting-balance support: No upper extremity supported, Feet supported Sitting balance-Leahy Scale: Good     Standing balance support: Bilateral upper extremity supported, During functional activity Standing balance-Leahy Scale: Fair                               Pertinent Vitals/Pain Pain Assessment Pain Assessment: 0-10 Pain Score: 5  Pain Location: R anterior and posterior calf area with toucha nd increased pain with eight bearing and in dependent position Pain Descriptors / Indicators: Burning, Throbbing, Sore Pain Intervention(s): Limited activity within patient's tolerance, Monitored during session, Other (comment) (elevated entrie R LE to avoid knee flexion since TKA recently)    Home Living Family/patient expects to be discharged to:: Private residence Living Arrangements: Alone Available Help at Discharge: Available PRN/intermittently (pt states friends help him get groceries and to MD appointments) Type of Home: House Home Access: Ramped entrance       Home Layout: One level Home Equipment: Conservation officer, nature (2 wheels);Cane - single point      Prior Function Prior Level of Function : Independent/Modified Independent             Mobility Comments: Pt states he is home alone and has been using RW since DC from SNF and HHPT and HHOT was helping him impove with more activity tolerance       Hand Dominance        Extremity/Trunk Assessment        Lower Extremity Assessment Lower Extremity Assessment: RLE deficits/detail RLE Deficits / Details: Kene extension abotu 10 degree ful full extension. Pt reports HPT was working on this. Flexion was grossly 95 degrees  in sitting. MMT 3-/5 for R LE knee extension at this time. Unable to perform SLR independently. and unable to complete terminal knee extesion without help. DF WNL, but Bilaterally PT is very limited ( he does have h/o Ankle surgery)       Communication   Communication: No difficulties  Cognition Arousal/Alertness: Awake/alert Behavior During Therapy: WFL for tasks assessed/performed Overall Cognitive Status: Within Functional Limits for tasks assessed                                          General Comments      Exercises Total Joint Exercises Ankle Circles/Pumps: AROM, Both, 10 reps, Supine Quad Sets: AROM, Supine, Right, 10 reps Short Arc Quad: AAROM, Supine, Right, 10 reps Heel Slides: AAROM, Supine, Right, 10 reps Hip ABduction/ADduction: AAROM, Supine, Right, 10 reps Straight Leg Raises: AAROM, Supine, Right, 10 reps Long Arc Quad: AAROM, Seated, 10 reps Goniometric ROM: 10-95 seated   Assessment/Plan  PT Assessment Patient needs continued PT services  PT Problem List Decreased strength;Decreased range of motion;Decreased activity tolerance;Decreased mobility       PT Treatment Interventions DME instruction;Gait training;Functional mobility training;Therapeutic activities;Therapeutic exercise;Balance training;Patient/family education    PT Goals (Current goals can be found in the Care Plan section)  Acute Rehab PT Goals Patient Stated Goal: I want to get back to where I was a few days ago. I am worried about the pain I am feeling in my R leg at this time. PT Goal Formulation: With patient Time For Goal Achievement: 03/27/22 Potential to Achieve Goals: Good    Frequency Min 4X/week     Co-evaluation               AM-PAC PT "6 Clicks" Mobility  Outcome Measure Help needed turning from your back to your side while in a flat bed without using bedrails?: A Little Help needed moving from lying on your back to sitting on the side of a flat  bed without using bedrails?: A Little Help needed moving to and from a bed to a chair (including a wheelchair)?: A Little Help needed standing up from a chair using your arms (e.g., wheelchair or bedside chair)?: A Lot Help needed to walk in hospital room?: A Lot Help needed climbing 3-5 steps with a railing? : A Lot 6 Click Score: 15    End of Session Equipment Utilized During Treatment: Gait belt Activity Tolerance: Patient tolerated treatment well Patient left: in bed;with call bell/phone within reach (due to pt in ED) Nurse Communication: Mobility status PT Visit Diagnosis: Other abnormalities of gait and mobility (R26.89)    Time: 7711-6579 PT Time Calculation (min) (ACUTE ONLY): 34 min   Charges:   PT Evaluation $PT Eval Low Complexity: 1 Low PT Treatments $Therapeutic Exercise: 8-22 mins        Gatha Mayer, PT, MPT Acute Rehabilitation Services Office: [phone] If a weekend: [facility] w/e pager [phone] 03/13/2022   Clide Dales 03/13/2022, 12:05 PM

## 2022-03-13 NOTE — ED Notes (Signed)
MD aware that patient added on 2lpm [place]. Pt states suppose to be on doxycycline for cellulitis of lower extremities. MD aware

## 2022-03-13 NOTE — ED Notes (Signed)
Patient c/o nausea. Dr. Melina Copa aware.

## 2022-03-13 NOTE — ED Notes (Addendum)
Patient c/o heartburn. Dr. Melina Copa aware. Patient denies chest pain.

## 2022-03-13 NOTE — Progress Notes (Signed)
CSW spoke with Gatha Mayer, PT who states she is recommending patient return to SNF at discharge for additional rehab. Patient was previously at [facility] in [place] and desires to return.  CSW completed FL2 and faxed patient's clinicals to [facility] for review.  Madilyn Fireman, MSW, LCSW Transitions of Care  Clinical Social Worker II [phone]

## 2022-03-13 NOTE — ED Notes (Signed)
Patient pulled up in bed Added oxygen due to 84% RA. Patient talking in full sentences with no distress

## 2022-03-13 NOTE — ED Notes (Signed)
Patient given meal tray.

## 2022-03-13 NOTE — NC FL2 (Signed)
[place] LEVEL OF CARE SCREENING TOOL     IDENTIFICATION  Patient Name: Jordan [name] Birthdate: 10/13/1950 Sex: male Admission Date (Current Location): 03/12/2022  [facility] [place] Number:  Herbalist and Address:  [facility], [place] [address], [place], [place] [address]      Provider Number: [id number]  Attending Physician Name and Address:  Default, Provider, MD  Relative Name and Phone Number:       Current Level of Care: Hospital Recommended Level of Care: [place] Prior Approval Number:    Date Approved/Denied:   PASRR Number: [id number]  Discharge Plan: SNF    Current Diagnoses: Patient Active Problem List   Diagnosis Date Noted   OA (osteoarthritis) of knee 02/07/2022   Osteoarthritis of right knee 02/07/2022   Preoperative cardiovascular examination 11/03/2021   Sinus bradycardia 11/03/2021   Stage 3 chronic kidney disease ([place]) 08/30/2021   Aortic atherosclerosis ([place]) 08/30/2021   Chronic respiratory failure with hypoxia ([place]) 02/23/2021   Anxiety    Persistent atrial fibrillation ([place]) 01/03/2021   Open wound of left foot 12/30/2020   Macrocytic anemia 12/30/2020   Arthritis 12/30/2020   Chronic heart failure with preserved ejection fraction ([place]) 12/30/2020   Left leg cellulitis 12/29/2020   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   Diabetes mellitus without complication ([place])    COPD (chronic obstructive pulmonary disease) ([place])    CAD (coronary artery disease)    Severe aortic stenosis s/p TAVR in Jan 2022    GERD (gastroesophageal reflux disease)    Protein-calorie malnutrition, severe 03/05/2020   Tobacco abuse 03/03/2020   Pressure injury of skin 03/15/2017   Chronic venous insufficiency 04/28/2014   PAD (peripheral artery disease) ([place]) 01/21/2014   Varicose veins of lower extremities with other complications 42/35/3614   Hx of TIA (transient  ischemic attack) and stroke 12/31/2013   HLD (hyperlipidemia) 12/31/2013   Severe obesity (BMI >= 40) ([place]) 12/31/2013   Essential hypertension 09/13/2012   Edema 09/13/2012    Orientation RESPIRATION BLADDER Height & Weight     Self, Time, Situation, Place  Normal Continent Weight: 174 lb (78.9 kg) Height:  6' (182.9 cm)  BEHAVIORAL SYMPTOMS/MOOD NEUROLOGICAL BOWEL NUTRITION STATUS      Continent Diet  AMBULATORY STATUS COMMUNICATION OF NEEDS Skin   Extensive Assist Verbally Normal                       Personal Care Assistance Level of Assistance  Bathing, Feeding, Dressing Bathing Assistance: Limited assistance Feeding assistance: Limited assistance Dressing Assistance: Limited assistance     Functional Limitations Info  Sight, Hearing, Speech Sight Info: Adequate (Glasses) Hearing Info: Adequate Speech Info: Adequate    SPECIAL CARE FACTORS FREQUENCY  PT (By licensed PT), OT (By licensed OT)     PT Frequency: 5x weekly OT Frequency: 5x weekly            Contractures      Additional Factors Info  Code Status, Allergies   Allergies Info: Altace (Ramipril)   Codeine   Metoprolol Tartrate   Naproxen           Current Medications (03/13/2022):  This is the current hospital active medication list Current Facility-Administered Medications  Medication Dose Route Frequency Provider Last Rate Last Admin   acetaminophen (TYLENOL) tablet 650 mg  650 mg Oral Q4H PRN Kneller, Victoria K, DO       apixaban (  ELIQUIS) tablet 5 mg  5 mg Oral BID Curatolo, Adam, DO   5 mg at 03/12/22 2222   atorvastatin (LIPITOR) tablet 80 mg  80 mg Oral q AM Curatolo, Adam, DO       doxepin (SINEQUAN) capsule 50 mg  50 mg Oral QHS PRN Curatolo, Adam, DO       furosemide (LASIX) tablet 20 mg  20 mg Oral Daily Curatolo, Adam, DO       LORazepam (ATIVAN) tablet 1 mg  1 mg Oral QHS Curatolo, Adam, DO   1 mg at 03/12/22 2222   methocarbamol (ROBAXIN) tablet 500 mg  500 mg Oral Q6H PRN  Curatolo, Adam, DO       oxyCODONE (Oxy IR/ROXICODONE) immediate release tablet 5 mg  5 mg Oral Q6H PRN Curatolo, Adam, DO       Current Outpatient Medications  Medication Sig Dispense Refill   ACETAMINOPHEN-BUTALBITAL 50-325 MG TABS Take 1 tablet by mouth 2 (two) times daily as needed (migraines).     albuterol (VENTOLIN HFA) 108 (90 Base) MCG/ACT inhaler Inhale 1-2 puffs into the lungs every 6 (six) hours as needed for wheezing or shortness of breath.     atenolol (TENORMIN) 50 MG tablet Take 50 mg by mouth daily.     atorvastatin (LIPITOR) 80 MG tablet Take 80 mg by mouth in the morning.     bismuth subsalicylate (PEPTO BISMOL) 262 MG/15ML suspension Take 30 mLs by mouth every 6 (six) hours as needed for indigestion.     budesonide-formoterol (SYMBICORT) 160-4.5 MCG/ACT inhaler Inhale 2 puffs into the lungs in the morning and at bedtime. (Patient taking differently: Inhale 2 puffs into the lungs 2 (two) times daily as needed (for shortness of breath or wheezing).) 1 each 0   carboxymethylcellulose (REFRESH PLUS) 0.5 % SOLN Place 1 drop into both eyes 3 (three) times daily as needed (dry eyes).     Chlorphen-Pseudoephed-APAP (CORICIDIN D PO) Take 1 tablet by mouth daily as needed (for congestion).     doxepin (SINEQUAN) 50 MG capsule Take 50 mg by mouth at bedtime as needed (for sleep).     doxycycline (VIBRAMYCIN) 100 MG capsule Take 100 mg by mouth 2 (two) times daily.     EFFEXOR XR 75 MG 24 hr capsule Take 75 mg by mouth daily with breakfast.     ELIQUIS 5 MG TABS tablet TAKE 1 TABLET BY MOUTH TWICE A DAY 180 tablet 1   furosemide (LASIX) 40 MG tablet Take 1 tablet by mouth every other day alternating with 1/2 tablet every other day (Patient taking differently: Take 40 mg by mouth in the morning.) 90 tablet 3   ipratropium-albuterol (DUONEB) 0.5-2.5 (3) MG/3ML SOLN Take 3 mLs by nebulization every 6 (six) hours as needed (shortness of breath/wheezing). 360 mL 0   LORazepam (ATIVAN) 1 MG  tablet Take 1 mg by mouth every 8 (eight) hours as needed for anxiety.     methocarbamol (ROBAXIN) 500 MG tablet Take 1 tablet (500 mg total) by mouth every 6 (six) hours as needed for muscle spasms. 40 tablet 0   montelukast (SINGULAIR) 10 MG tablet Take 10 mg by mouth as needed (for allergies).     Multiple Vitamin (MULTIVITAMIN WITH MINERALS) TABS Take 1 tablet by mouth daily with breakfast.     nystatin cream (MYCOSTATIN) APPLY TO AFFECTED AREA TWICE A DAY (Patient taking differently: Apply 1 Application topically 2 (two) times daily as needed for dry skin.) 30 g 12  omeprazole (PRILOSEC OTC) 20 MG tablet Take 20 mg by mouth daily before breakfast.     oxyCODONE (OXY IR/ROXICODONE) 5 MG immediate release tablet Take 1-2 tablets (5-10 mg total) by mouth every 6 (six) hours as needed for severe pain. 42 tablet 0   promethazine (PHENERGAN) 25 MG tablet Take 25 mg by mouth every 8 (eight) hours as needed for nausea/vomiting.     folic acid (FOLVITE) 1 MG tablet Take 1 tablet (1 mg total) by mouth daily. (Patient not taking: Reported on 03/12/2022) 30 tablet 0   Metoprolol Tartrate 75 MG TABS Take 37.5 mg by mouth 2 (two) times daily. (Patient not taking: Reported on 03/12/2022) 180 tablet 3   venlafaxine XR (EFFEXOR-XR) 75 MG 24 hr capsule Take 75 mg by mouth in the morning. MUST HAVE BRAND NAME ONLY (Patient not taking: Reported on 03/12/2022)       Discharge Medications: Please see discharge summary for a list of discharge medications.  Relevant Imaging Results:  Relevant Lab Results:   Additional Information SSN: [id number]  Archie Endo, LCSW

## 2022-03-13 NOTE — ED Notes (Signed)
Patient resting with eyes closed. Equal chest rise and fall noted. 

## 2022-03-14 LAB — TROPONIN I (HIGH SENSITIVITY)
Troponin I (High Sensitivity): 15 ng/L (ref <18)
Troponin I (High Sensitivity): 17 ng/L (ref <18)

## 2022-03-14 LAB — CBG MONITORING, ED: Glucose-Capillary: 121 mg/dL — ABNORMAL HIGH (ref 70–99)

## 2022-03-14 MED ORDER — IPRATROPIUM-ALBUTEROL 0.5-2.5 (3) MG/3ML IN SOLN
3.0000 mL | Freq: Two times a day (BID) | RESPIRATORY_TRACT | Status: DC
  Administered 2022-03-14 – 2022-03-15 (×2): 3 mL via RESPIRATORY_TRACT
  Filled 2022-03-14 (×2): qty 3

## 2022-03-14 MED ORDER — IPRATROPIUM-ALBUTEROL 0.5-2.5 (3) MG/3ML IN SOLN: 3.0000 mL | Freq: Two times a day (BID) | RESPIRATORY_TRACT | Status: DC

## 2022-03-14 NOTE — ED Provider Notes (Signed)
Emergency Medicine Observation Re-evaluation Note  Jordan [name] is a 71 y.o. male, seen on rounds today.  Pt initially presented to the ED for complaints of Leg Pain Currently, the patient is resting in bed.  Physical Exam  BP (!) 151/66   Pulse 100   Temp 98.2 F (36.8 C) (Oral)   Resp 20   Ht 6' (1.829 m)   Wt 78.9 kg   SpO2 90%   BMI 23.60 kg/m  Physical Exam General: Awake, alert Cardiac: Regular rate and rhythm Lungs: Breathing is unlabored.  SPO2 of 98% on 2 L of supplemental oxygen Psych: No agitation  ED Course / MDM  EKG:   I have reviewed the labs performed to date as well as medications administered while in observation.  Recent changes in the last 24 hours include onset of nausea and hypoxia last night.  Patient reports that the symptoms have resolved.  He is maintained on 2 L of supplemental oxygen, which is new.  He does state that he had supplemental oxygen requirement after his recent knee surgery.  Given his symptoms yesterday, will check EKG and troponin.  EKG shows known A-fib with no ST segment changes.  Troponin is normal.  When pulse oximeter is placed on patient's scalp, he has normal SPO2 on room air.  Hypoxia likely related to poor perfusion in his hands.  He also has a history of COPD and does daily breathing treatments at home.  He has not received any here.  These were ordered.  Patient mains medically cleared for social work placement.  Plan  Current plan is for SNF placement.    Godfrey Pick, MD 03/14/22 1723

## 2022-03-14 NOTE — Progress Notes (Signed)
Pt was accepted at [facility] at [facility]. Admissions rep will begin Auth process. TOC following.

## 2022-03-15 DIAGNOSIS — Z7401 Bed confinement status: Secondary | ICD-10-CM | POA: Diagnosis not present

## 2022-03-15 DIAGNOSIS — R0902 Hypoxemia: Secondary | ICD-10-CM | POA: Diagnosis not present

## 2022-03-15 DIAGNOSIS — M79661 Pain in right lower leg: Secondary | ICD-10-CM | POA: Diagnosis not present

## 2022-03-15 DIAGNOSIS — J449 Chronic obstructive pulmonary disease, unspecified: Secondary | ICD-10-CM | POA: Diagnosis not present

## 2022-03-15 DIAGNOSIS — G8918 Other acute postprocedural pain: Secondary | ICD-10-CM | POA: Diagnosis not present

## 2022-03-15 DIAGNOSIS — M25561 Pain in right knee: Secondary | ICD-10-CM | POA: Diagnosis not present

## 2022-03-15 DIAGNOSIS — R112 Nausea with vomiting, unspecified: Secondary | ICD-10-CM | POA: Diagnosis not present

## 2022-03-15 DIAGNOSIS — Z79899 Other long term (current) drug therapy: Secondary | ICD-10-CM | POA: Diagnosis not present

## 2022-03-15 DIAGNOSIS — R5383 Other fatigue: Secondary | ICD-10-CM | POA: Diagnosis not present

## 2022-03-15 DIAGNOSIS — Z7901 Long term (current) use of anticoagulants: Secondary | ICD-10-CM | POA: Diagnosis not present

## 2022-03-15 DIAGNOSIS — M1711 Unilateral primary osteoarthritis, right knee: Secondary | ICD-10-CM | POA: Diagnosis not present

## 2022-03-15 NOTE — ED Notes (Signed)
Patient has been cleaned and changed. Patient was given a coke and is currently resting.

## 2022-03-15 NOTE — ED Notes (Signed)
Pt called out stating he wants to speak to SW. RN aware

## 2022-03-15 NOTE — Progress Notes (Addendum)
This CSW went to inform the pt that [facility] in [place] has extended a bed offer and the Auth is approved. The pt accepts the bed offer and is agreeable to d/c plan. The pt gave this CSW verbal consent to contact his Sudie Bailey, who is listed in his chart.   The pt inquired about whether this CSW can contact the provider who completed his knee surgery and inform him of the situation due to him having an appt today at 2 PM. This CSW rec'd verbal consent from the pt to inform Dr. Hector Shade of the pt not being able to attend the appt due to being in the ED. TOC will inform RN and EDP that pt is d/c to SNF today.   TOC supervisor, Beverlee Nims, has contacted the office to inform them that the pt will not be attending the appt today.   Report #: [phone] Room: 935T  Info provided to RN and EDP. [facility] called for transport.

## 2022-03-15 NOTE — ED Provider Notes (Signed)
Emergency Medicine Observation Re-evaluation Note  Jordan [name] is a 71 y.o. male, seen on rounds today.  Pt initially presented to the ED for complaints of Leg Pain Currently, the patient is sleeping.  Physical Exam  BP 122/66   Pulse 83   Temp (!) 97.5 F (36.4 C) (Oral)   Resp 19   Ht 6' (1.829 m)   Wt 78.9 kg   SpO2 100%   BMI 23.60 kg/m  Physical Exam General: Sleeping Cardiac: Normal rate, irregular rhythm Lungs: Breathing is unlabored Psych: Deferred  ED Course / MDM  EKG:EKG Interpretation  Date/Time:  Monday March 14 2022 08:50:55 EDT Ventricular Rate:  102 PR Interval:    QRS Duration: 87 QT Interval:  336 QTC Calculation: 438 R Axis:   75 Text Interpretation: Atrial fibrillation Borderline repolarization abnormality Confirmed by Fredia Sorrow [phone]) on 03/14/2022 8:53:16 AM  I have reviewed the labs performed to date as well as medications administered while in observation.  Recent changes in the last 24 hours include patient was accepted at [facility].  Plan  Current plan is for rehab facility placement.    Godfrey Pick, MD 03/15/22 0730

## 2022-04-01 ENCOUNTER — Emergency Department (HOSPITAL_COMMUNITY): Payer: Medicare PPO

## 2022-04-01 ENCOUNTER — Other Ambulatory Visit: Payer: Self-pay

## 2022-04-01 ENCOUNTER — Other Ambulatory Visit: Payer: Self-pay | Admitting: Physician Assistant

## 2022-04-01 ENCOUNTER — Inpatient Hospital Stay (HOSPITAL_COMMUNITY)
Admission: EM | Admit: 2022-04-01 | Discharge: 2022-04-12 | DRG: 178 | Disposition: A | Discharge status: Skilled Nursing Facility | Payer: Medicare PPO | Attending: Internal Medicine | Admitting: Internal Medicine

## 2022-04-01 ENCOUNTER — Encounter (HOSPITAL_COMMUNITY): Payer: Self-pay | Admitting: Emergency Medicine

## 2022-04-01 DIAGNOSIS — R579 Shock, unspecified: Secondary | ICD-10-CM | POA: Diagnosis present

## 2022-04-01 DIAGNOSIS — E861 Hypovolemia: Secondary | ICD-10-CM | POA: Diagnosis present

## 2022-04-01 DIAGNOSIS — Z6823 Body mass index (BMI) 23.0-23.9, adult: Secondary | ICD-10-CM

## 2022-04-01 DIAGNOSIS — J9621 Acute and chronic respiratory failure with hypoxia: Secondary | ICD-10-CM | POA: Diagnosis present

## 2022-04-01 DIAGNOSIS — I4819 Other persistent atrial fibrillation: Secondary | ICD-10-CM | POA: Diagnosis present

## 2022-04-01 DIAGNOSIS — Z66 Do not resuscitate: Secondary | ICD-10-CM | POA: Diagnosis present

## 2022-04-01 DIAGNOSIS — R6510 Systemic inflammatory response syndrome (SIRS) of non-infectious origin without acute organ dysfunction: Secondary | ICD-10-CM | POA: Diagnosis not present

## 2022-04-01 DIAGNOSIS — A419 Sepsis, unspecified organism: Principal | ICD-10-CM

## 2022-04-01 DIAGNOSIS — Z96651 Presence of right artificial knee joint: Secondary | ICD-10-CM | POA: Diagnosis present

## 2022-04-01 DIAGNOSIS — F410 Panic disorder [episodic paroxysmal anxiety] without agoraphobia: Secondary | ICD-10-CM | POA: Diagnosis not present

## 2022-04-01 DIAGNOSIS — J449 Chronic obstructive pulmonary disease, unspecified: Secondary | ICD-10-CM | POA: Diagnosis present

## 2022-04-01 DIAGNOSIS — F17210 Nicotine dependence, cigarettes, uncomplicated: Secondary | ICD-10-CM | POA: Diagnosis present

## 2022-04-01 DIAGNOSIS — I509 Heart failure, unspecified: Secondary | ICD-10-CM

## 2022-04-01 DIAGNOSIS — Z452 Encounter for adjustment and management of vascular access device: Secondary | ICD-10-CM | POA: Diagnosis not present

## 2022-04-01 DIAGNOSIS — I2510 Atherosclerotic heart disease of native coronary artery without angina pectoris: Secondary | ICD-10-CM | POA: Diagnosis not present

## 2022-04-01 DIAGNOSIS — E876 Hypokalemia: Secondary | ICD-10-CM | POA: Diagnosis not present

## 2022-04-01 DIAGNOSIS — Z885 Allergy status to narcotic agent status: Secondary | ICD-10-CM

## 2022-04-01 DIAGNOSIS — Z79899 Other long term (current) drug therapy: Secondary | ICD-10-CM

## 2022-04-01 DIAGNOSIS — I82409 Acute embolism and thrombosis of unspecified deep veins of unspecified lower extremity: Secondary | ICD-10-CM

## 2022-04-01 DIAGNOSIS — R627 Adult failure to thrive: Secondary | ICD-10-CM | POA: Diagnosis present

## 2022-04-01 DIAGNOSIS — J9611 Chronic respiratory failure with hypoxia: Secondary | ICD-10-CM | POA: Diagnosis present

## 2022-04-01 DIAGNOSIS — Z952 Presence of prosthetic heart valve: Secondary | ICD-10-CM

## 2022-04-01 DIAGNOSIS — Z7901 Long term (current) use of anticoagulants: Secondary | ICD-10-CM

## 2022-04-01 DIAGNOSIS — J9622 Acute and chronic respiratory failure with hypercapnia: Secondary | ICD-10-CM | POA: Diagnosis present

## 2022-04-01 DIAGNOSIS — I110 Hypertensive heart disease with heart failure: Secondary | ICD-10-CM | POA: Diagnosis present

## 2022-04-01 DIAGNOSIS — M7989 Other specified soft tissue disorders: Secondary | ICD-10-CM | POA: Diagnosis not present

## 2022-04-01 DIAGNOSIS — U071 COVID-19: Secondary | ICD-10-CM | POA: Diagnosis not present

## 2022-04-01 DIAGNOSIS — Z7951 Long term (current) use of inhaled steroids: Secondary | ICD-10-CM

## 2022-04-01 DIAGNOSIS — E782 Mixed hyperlipidemia: Secondary | ICD-10-CM | POA: Diagnosis not present

## 2022-04-01 DIAGNOSIS — E785 Hyperlipidemia, unspecified: Secondary | ICD-10-CM | POA: Diagnosis present

## 2022-04-01 DIAGNOSIS — I10 Essential (primary) hypertension: Secondary | ICD-10-CM | POA: Diagnosis not present

## 2022-04-01 DIAGNOSIS — R410 Disorientation, unspecified: Secondary | ICD-10-CM | POA: Diagnosis present

## 2022-04-01 DIAGNOSIS — Z886 Allergy status to analgesic agent status: Secondary | ICD-10-CM

## 2022-04-01 DIAGNOSIS — R2681 Unsteadiness on feet: Secondary | ICD-10-CM | POA: Diagnosis not present

## 2022-04-01 DIAGNOSIS — E860 Dehydration: Secondary | ICD-10-CM | POA: Diagnosis not present

## 2022-04-01 DIAGNOSIS — G9340 Encephalopathy, unspecified: Secondary | ICD-10-CM | POA: Diagnosis not present

## 2022-04-01 DIAGNOSIS — R4182 Altered mental status, unspecified: Secondary | ICD-10-CM | POA: Diagnosis present

## 2022-04-01 DIAGNOSIS — R2689 Other abnormalities of gait and mobility: Secondary | ICD-10-CM | POA: Diagnosis not present

## 2022-04-01 DIAGNOSIS — F419 Anxiety disorder, unspecified: Secondary | ICD-10-CM | POA: Diagnosis present

## 2022-04-01 DIAGNOSIS — I739 Peripheral vascular disease, unspecified: Secondary | ICD-10-CM | POA: Diagnosis present

## 2022-04-01 DIAGNOSIS — D508 Other iron deficiency anemias: Secondary | ICD-10-CM | POA: Diagnosis not present

## 2022-04-01 DIAGNOSIS — R64 Cachexia: Secondary | ICD-10-CM | POA: Diagnosis present

## 2022-04-01 DIAGNOSIS — Z888 Allergy status to other drugs, medicaments and biological substances status: Secondary | ICD-10-CM

## 2022-04-01 DIAGNOSIS — Z751 Person awaiting admission to adequate facility elsewhere: Secondary | ICD-10-CM

## 2022-04-01 DIAGNOSIS — R531 Weakness: Secondary | ICD-10-CM | POA: Diagnosis not present

## 2022-04-01 DIAGNOSIS — E162 Hypoglycemia, unspecified: Secondary | ICD-10-CM | POA: Diagnosis present

## 2022-04-01 DIAGNOSIS — D649 Anemia, unspecified: Secondary | ICD-10-CM | POA: Diagnosis present

## 2022-04-01 DIAGNOSIS — I5030 Unspecified diastolic (congestive) heart failure: Secondary | ICD-10-CM | POA: Diagnosis not present

## 2022-04-01 DIAGNOSIS — Z823 Family history of stroke: Secondary | ICD-10-CM

## 2022-04-01 DIAGNOSIS — M6281 Muscle weakness (generalized): Secondary | ICD-10-CM | POA: Diagnosis not present

## 2022-04-01 DIAGNOSIS — R000 Tachycardia, unspecified: Secondary | ICD-10-CM | POA: Diagnosis not present

## 2022-04-01 DIAGNOSIS — I5089 Other heart failure: Secondary | ICD-10-CM | POA: Diagnosis not present

## 2022-04-01 DIAGNOSIS — F32A Depression, unspecified: Secondary | ICD-10-CM | POA: Diagnosis not present

## 2022-04-01 DIAGNOSIS — Z820 Family history of epilepsy and other diseases of the nervous system: Secondary | ICD-10-CM

## 2022-04-01 DIAGNOSIS — Z7401 Bed confinement status: Secondary | ICD-10-CM | POA: Diagnosis not present

## 2022-04-01 DIAGNOSIS — I4891 Unspecified atrial fibrillation: Secondary | ICD-10-CM | POA: Diagnosis not present

## 2022-04-01 DIAGNOSIS — G43909 Migraine, unspecified, not intractable, without status migrainosus: Secondary | ICD-10-CM | POA: Diagnosis present

## 2022-04-01 DIAGNOSIS — Z9981 Dependence on supplemental oxygen: Secondary | ICD-10-CM

## 2022-04-01 DIAGNOSIS — I959 Hypotension, unspecified: Secondary | ICD-10-CM | POA: Diagnosis not present

## 2022-04-01 DIAGNOSIS — R399 Unspecified symptoms and signs involving the genitourinary system: Secondary | ICD-10-CM | POA: Diagnosis not present

## 2022-04-01 DIAGNOSIS — I5032 Chronic diastolic (congestive) heart failure: Secondary | ICD-10-CM | POA: Diagnosis present

## 2022-04-01 DIAGNOSIS — G4730 Sleep apnea, unspecified: Secondary | ICD-10-CM | POA: Diagnosis present

## 2022-04-01 DIAGNOSIS — Z85820 Personal history of malignant melanoma of skin: Secondary | ICD-10-CM

## 2022-04-01 DIAGNOSIS — R278 Other lack of coordination: Secondary | ICD-10-CM | POA: Diagnosis not present

## 2022-04-01 DIAGNOSIS — I820 Budd-Chiari syndrome: Secondary | ICD-10-CM

## 2022-04-01 DIAGNOSIS — G9341 Metabolic encephalopathy: Secondary | ICD-10-CM | POA: Diagnosis present

## 2022-04-01 DIAGNOSIS — K219 Gastro-esophageal reflux disease without esophagitis: Secondary | ICD-10-CM | POA: Diagnosis present

## 2022-04-01 DIAGNOSIS — F418 Other specified anxiety disorders: Secondary | ICD-10-CM | POA: Diagnosis not present

## 2022-04-01 DIAGNOSIS — J9612 Chronic respiratory failure with hypercapnia: Secondary | ICD-10-CM | POA: Diagnosis present

## 2022-04-01 DIAGNOSIS — Z8249 Family history of ischemic heart disease and other diseases of the circulatory system: Secondary | ICD-10-CM

## 2022-04-01 LAB — COMPREHENSIVE METABOLIC PANEL
ALT: 25 U/L (ref 0–44)
AST: 42 U/L — ABNORMAL HIGH (ref 15–41)
Albumin: 3.2 g/dL — ABNORMAL LOW (ref 3.5–5.0)
Alkaline Phosphatase: 103 U/L (ref 38–126)
Anion gap: 7 (ref 5–15)
BUN: 29 mg/dL — ABNORMAL HIGH (ref 8–23)
CO2: 29 mmol/L (ref 22–32)
Calcium: 8.1 mg/dL — ABNORMAL LOW (ref 8.9–10.3)
Chloride: 100 mmol/L (ref 98–111)
Creatinine, Ser: 1.24 mg/dL (ref 0.61–1.24)
GFR, Estimated: >60 mL/min (ref >60)
Glucose, Bld: 84 mg/dL (ref 70–99)
Potassium: 3.7 mmol/L (ref 3.5–5.1)
Sodium: 136 mmol/L (ref 135–145)
Total Bilirubin: 1.3 mg/dL — ABNORMAL HIGH (ref 0.3–1.2)
Total Protein: 6 g/dL — ABNORMAL LOW (ref 6.5–8.1)

## 2022-04-01 LAB — URINALYSIS, ROUTINE W REFLEX MICROSCOPIC
Bilirubin Urine: NEGATIVE
Glucose, UA: NEGATIVE mg/dL
Hgb urine dipstick: NEGATIVE
Ketones, ur: NEGATIVE mg/dL
Leukocytes,Ua: NEGATIVE
Nitrite: NEGATIVE
Protein, ur: NEGATIVE mg/dL
Specific Gravity, Urine: 1.006 (ref 1.005–1.030)
pH: 6 (ref 5.0–8.0)

## 2022-04-01 LAB — CBC WITH DIFFERENTIAL/PLATELET
Abs Immature Granulocytes: 0.02 10*3/uL (ref 0.00–0.07)
Basophils Absolute: 0 10*3/uL (ref 0.0–0.1)
Basophils Relative: 0 %
Eosinophils Absolute: 0 10*3/uL (ref 0.0–0.5)
Eosinophils Relative: 0 %
HCT: 31.7 % — ABNORMAL LOW (ref 39.0–52.0)
Hemoglobin: 9.9 g/dL — ABNORMAL LOW (ref 13.0–17.0)
Immature Granulocytes: 1 %
Lymphocytes Relative: 26 %
Lymphs Abs: 1 10*3/uL (ref 0.7–4.0)
MCH: 27 pg (ref 26.0–34.0)
MCHC: 31.2 g/dL (ref 30.0–36.0)
MCV: 86.4 fL (ref 80.0–100.0)
Monocytes Absolute: 0.4 10*3/uL (ref 0.1–1.0)
Monocytes Relative: 11 %
Neutro Abs: 2.4 10*3/uL (ref 1.7–7.7)
Neutrophils Relative %: 62 %
Platelets: 172 10*3/uL (ref 150–400)
RBC: 3.67 MIL/uL — ABNORMAL LOW (ref 4.22–5.81)
RDW: 15.7 % — ABNORMAL HIGH (ref 11.5–15.5)
WBC: 3.8 10*3/uL — ABNORMAL LOW (ref 4.0–10.5)
nRBC: 0 % (ref 0.0–0.2)

## 2022-04-01 LAB — CBG MONITORING, ED: Glucose-Capillary: 65 mg/dL — ABNORMAL LOW (ref 70–99)

## 2022-04-01 LAB — LACTIC ACID, PLASMA: Lactic Acid, Venous: 1.8 mmol/L (ref 0.5–1.9)

## 2022-04-01 LAB — AMMONIA: Ammonia: 18 umol/L (ref 9–35)

## 2022-04-01 MED ORDER — VANCOMYCIN HCL 1500 MG/300ML IV SOLN
1500.0000 mg | Freq: Once | INTRAVENOUS | Status: AC
  Administered 2022-04-01: 1500 mg via INTRAVENOUS
  Filled 2022-04-01: qty 300

## 2022-04-01 MED ORDER — LACTATED RINGERS IV BOLUS (SEPSIS)
1000.0000 mL | Freq: Once | INTRAVENOUS | Status: AC
  Administered 2022-04-01: 1000 mL via INTRAVENOUS

## 2022-04-01 MED ORDER — LACTATED RINGERS IV SOLN: INTRAVENOUS | Status: DC

## 2022-04-01 MED ORDER — SODIUM CHLORIDE 0.9 % IV BOLUS
1000.0000 mL | Freq: Once | INTRAVENOUS | Status: AC
  Administered 2022-04-01: 1000 mL via INTRAVENOUS

## 2022-04-01 MED ORDER — SODIUM CHLORIDE 0.9 % IV SOLN
2.0000 g | Freq: Once | INTRAVENOUS | Status: AC
  Administered 2022-04-01: 2 g via INTRAVENOUS
  Filled 2022-04-01: qty 12.5

## 2022-04-01 MED ORDER — LACTATED RINGERS IV BOLUS (SEPSIS)
500.0000 mL | Freq: Once | INTRAVENOUS | Status: AC
  Administered 2022-04-02: 500 mL via INTRAVENOUS

## 2022-04-01 MED ORDER — METRONIDAZOLE 500 MG/100ML IV SOLN
500.0000 mg | Freq: Once | INTRAVENOUS | Status: AC
  Administered 2022-04-01: 500 mg via INTRAVENOUS
  Filled 2022-04-01: qty 100

## 2022-04-01 NOTE — Progress Notes (Signed)
A consult was received from an ED physician for Cefepime + vancomycin per pharmacy dosing.  The patient's profile has been reviewed for ht/wt/allergies/indication/available labs.   A one time order has been placed for Cefepime 2gm + Vancomycin '1500mg'$  IV.  Further antibiotics/pharmacy consults should be ordered by admitting physician if indicated.                       Thank you, Netta Cedars PharmD 04/01/2022  10:33 PM

## 2022-04-01 NOTE — ED Provider Notes (Signed)
[facility] DEPT Provider Note   CSN: [id number] Arrival date & time: 04/01/22  1843     History  Chief Complaint  Patient presents with   Altered Mental Status    Jordan [name] is a 71 y.o. male.  Pt is a 71 yo male with a pmhx significant for anxiety, COPD, depression, GERD, HTN, HLD, sleep apnea, chf, aortic stenosis s/p TAVR, PVD, melanoma, and afib on Eliquis.  Pt was just d/c from rehab.  His home health nurse called EMS saying he was confused and having hallucinations.  Pt said he feels fine.  His right knee hurts a little, but it's been hurting intermittently since his surgery by Dr. Maureen Ralphs on 8/21.  No fevers.        Home Medications Prior to Admission medications   Medication Sig Start Date End Date Taking? Authorizing Provider  ACETAMINOPHEN-BUTALBITAL 50-325 MG TABS Take 1 tablet by mouth 2 (two) times daily as needed (migraines).   Yes [provider]  albuterol (VENTOLIN HFA) 108 (90 Base) MCG/ACT inhaler Inhale 1-2 puffs into the lungs every 6 (six) hours as needed for wheezing or shortness of breath.   Yes [provider]  atenolol (TENORMIN) 50 MG tablet Take 50 mg by mouth daily.   Yes [provider]  atorvastatin (LIPITOR) 80 MG tablet Take 80 mg by mouth every other day.   Yes [provider]  bismuth subsalicylate (PEPTO BISMOL) 262 MG/15ML suspension Take 30 mLs by mouth every 6 (six) hours as needed for indigestion.   Yes [provider]  budesonide-formoterol (SYMBICORT) 160-4.5 MCG/ACT inhaler Inhale 2 puffs into the lungs in the morning and at bedtime. Patient taking differently: Inhale 2 puffs into the lungs 2 (two) times daily as needed (for shortness of breath or wheezing). 03/12/20  Yes Aline August, MD  Chlorphen-Pseudoephed-APAP (CORICIDIN D PO) Take 1 tablet by mouth daily as needed (for congestion).   Yes [provider]  doxepin (SINEQUAN) 50 MG capsule Take  50 mg by mouth at bedtime as needed (for sleep). 09/21/21  Yes [provider]  EFFEXOR XR 75 MG 24 hr capsule Take 75 mg by mouth daily with breakfast.   Yes [provider]  ELIQUIS 5 MG TABS tablet TAKE 1 TABLET BY MOUTH TWICE A DAY 02/25/22  Yes Eileen Stanford, PA-C  furosemide (LASIX) 40 MG tablet Take 1 tablet by mouth every other day alternating with 1/2 tablet every other day Patient taking differently: Take 40 mg by mouth every other day. 03/16/21  Yes Weaver, [name] T, PA-C  Glycerin (CLEAR EYES ADV DRY & ITCHY RLF) 0.25 % SOLN Apply 1 drop to eye daily as needed (dry eyes).   Yes [provider]  ipratropium-albuterol (DUONEB) 0.5-2.5 (3) MG/3ML SOLN Take 3 mLs by nebulization every 6 (six) hours as needed (shortness of breath/wheezing). 03/12/20  Yes Aline August, MD  LORazepam (ATIVAN) 1 MG tablet Take 1 mg by mouth every 8 (eight) hours as needed for anxiety. 02/22/21  Yes [provider]  methocarbamol (ROBAXIN) 500 MG tablet Take 1 tablet (500 mg total) by mouth every 6 (six) hours as needed for muscle spasms. 02/08/22  Yes Edmisten, Kristie L, PA  montelukast (SINGULAIR) 10 MG tablet Take 10 mg by mouth as needed (for allergies). 02/07/20  Yes [provider]  Multiple Vitamin (MULTIVITAMIN WITH MINERALS) TABS Take 1 tablet by mouth daily with breakfast.   Yes [provider]  nystatin cream (MYCOSTATIN)  APPLY TO AFFECTED AREA TWICE A DAY Patient taking differently: Apply 1 Application topically 2 (two) times daily as needed for dry skin. 07/20/21  Yes Eileen Stanford, PA-C  omeprazole (PRILOSEC OTC) 20 MG tablet Take 20 mg by mouth daily as needed (heartburn).   Yes [provider]  oxyCODONE (OXY IR/ROXICODONE) 5 MG immediate release tablet Take 1-2 tablets (5-10 mg total) by mouth every 6 (six) hours as needed for severe pain. 02/08/22  Yes Edmisten, Kristie L, PA  promethazine (PHENERGAN) 25 MG tablet Take 25 mg by mouth  every 8 (eight) hours as needed for nausea/vomiting. 05/14/20  Yes [provider]  folic acid (FOLVITE) 1 MG tablet Take 1 tablet (1 mg total) by mouth daily. Patient not taking: Reported on 03/12/2022 11/04/14   [name], MD  Metoprolol Tartrate 75 MG TABS Take 37.5 mg by mouth 2 (two) times daily. Patient not taking: Reported on 03/12/2022 11/03/21 03/12/22  [name] T, PA-C      Allergies    Altace [ramipril], Codeine, Metoprolol tartrate, and Naproxen    Review of Systems   Review of Systems  Neurological:  Positive for weakness.  All other systems reviewed and are negative.   Physical Exam Updated Vital Signs BP 98/66   Pulse (!) 133   Temp 99.9 F (37.7 C) (Rectal)   Resp 18   Ht 6' (1.829 m)   Wt 78.9 kg   SpO2 94%   BMI 23.59 kg/m  Physical Exam Vitals and nursing note reviewed.  Constitutional:      Appearance: Normal appearance.  HENT:     Head: Normocephalic and atraumatic.     Right Ear: External ear normal.     Left Ear: External ear normal.     Nose: Nose normal.     Mouth/Throat:     Mouth: Mucous membranes are dry.  Eyes:     Extraocular Movements: Extraocular movements intact.     Conjunctiva/sclera: Conjunctivae normal.     Pupils: Pupils are equal, round, and reactive to light.  Cardiovascular:     Rate and Rhythm: Regular rhythm. Tachycardia present.     Pulses: Normal pulses.     Heart sounds: Normal heart sounds.  Pulmonary:     Effort: Pulmonary effort is normal.     Breath sounds: Normal breath sounds.  Abdominal:     General: Abdomen is flat. Bowel sounds are normal.     Palpations: Abdomen is soft.  Musculoskeletal:        General: Normal range of motion.     Cervical back: Normal range of motion and neck supple.     Comments: Right knee slightly swollen.  Not red. Good rom  Skin:    General: Skin is warm.     Capillary Refill: Capillary refill takes less than 2 seconds.  Neurological:     General: No focal deficit  present.     Mental Status: He is alert and oriented to person, place, and time.  Psychiatric:        Mood and Affect: Mood normal.        Behavior: Behavior normal.     ED Results / Procedures / Treatments   Labs (all labs ordered are listed, but only abnormal results are displayed) Labs Reviewed  CBC WITH DIFFERENTIAL/PLATELET - Abnormal; Notable for the following components:      Result Value   WBC 3.8 (*)    RBC 3.67 (*)    Hemoglobin 9.9 (*)  HCT 31.7 (*)    RDW 15.7 (*)    All other components within normal limits  COMPREHENSIVE METABOLIC PANEL - Abnormal; Notable for the following components:   BUN 29 (*)    Calcium 8.1 (*)    Total Protein 6.0 (*)    Albumin 3.2 (*)    AST 42 (*)    Total Bilirubin 1.3 (*)    All other components within normal limits  CBG MONITORING, ED - Abnormal; Notable for the following components:   Glucose-Capillary 65 (*)    All other components within normal limits  RESP PANEL BY RT-PCR (FLU A&B, COVID) ARPGX2  CULTURE, BLOOD (SINGLE)  URINALYSIS, ROUTINE W REFLEX MICROSCOPIC  AMMONIA  LACTIC ACID, PLASMA    EKG EKG Interpretation  Date/Time:  Friday April 01 2022 18:55:19 EDT Ventricular Rate:  90 PR Interval:    QRS Duration: 95 QT Interval:  355 QTC Calculation: 435 R Axis:   76 Text Interpretation: Atrial fibrillation Anteroseptal infarct, old Nonspecific T abnormalities, inferior leads No significant change since last tracing Confirmed by Isla Pence [phone]) on 04/01/2022 7:26:59 PM  Radiology DG Chest Portable 1 View  Result Date: 04/01/2022 CLINICAL DATA:  Altered mental status.  Confusion. EXAM: PORTABLE CHEST 1 VIEW COMPARISON:  Radiograph 02/23/2021 FINDINGS: Patient's chin partially obscures the right lung apex. Lung volumes are low. Mild chronic cardiomegaly. TAVR. No acute airspace disease, pulmonary edema, large pleural effusion or pneumothorax. IMPRESSION: Low lung volumes. Mild chronic cardiomegaly.  Electronically Signed   By: Keith Rake M.D.   On: 04/01/2022 19:57    Procedures Procedures    Medications Ordered in ED Medications  lactated ringers infusion (has no administration in time range)  lactated ringers bolus 1,000 mL (has no administration in time range)    And  lactated ringers bolus 1,000 mL (has no administration in time range)    And  lactated ringers bolus 500 mL (has no administration in time range)  metroNIDAZOLE (FLAGYL) IVPB 500 mg (has no administration in time range)  ceFEPIme (MAXIPIME) 2 g in sodium chloride 0.9 % 100 mL IVPB (has no administration in time range)  vancomycin (VANCOREADY) IVPB 1500 mg/300 mL (has no administration in time range)  sodium chloride 0.9 % bolus 1,000 mL (0 mLs Intravenous Stopped 04/01/22 2023)  sodium chloride 0.9 % bolus 1,000 mL (1,000 mLs Intravenous New Bag/Given 04/01/22 2150)    ED Course/ Medical Decision Making/ A&P                           Medical Decision Making Amount and/or Complexity of Data Reviewed Labs: ordered. Radiology: ordered.  Risk Prescription drug management. Decision regarding hospitalization.   This patient presents to the ED for concern of ams, this involves an extensive number of treatment options, and is a complaint that carries with it a high risk of complications and morbidity.  The differential diagnosis includes infection, electrolyte abn   Co morbidities that complicate the patient evaluation  anxiety, COPD, depression, GERD, HTN, HLD, sleep apnea, chf, aortic stenosis s/p TAVR, PVD, melanoma, and afib on Eliquis   Additional history obtained:  Additional history obtained from epic chart review External records from outside source obtained and reviewed including EMS report   Lab Tests:  I Ordered, and personally interpreted labs.  The pertinent results include:  cmp with bun 29; cbc with wbc 3.8, hgb 9.9 (hgb 9.9 on 9.23); ua nl   Imaging Studies ordered:  I ordered  imaging studies including cxr  I independently visualized and interpreted imaging which showed  IMPRESSION:  Low lung volumes. Mild chronic cardiomegaly.   I agree with the radiologist interpretation   Cardiac Monitoring:  The patient was maintained on a cardiac monitor.  I personally viewed and interpreted the cardiac monitored which showed an underlying rhythm of: nsr   Medicines ordered and prescription drug management:  I ordered medication including ivfs  for hypotension  Reevaluation of the patient after these medicines showed that the patient improved I have reviewed the patients home medicines and have made adjustments as needed   Test Considered:  ct   Critical Interventions:  ivfs   Consultations Obtained:  I requested consultation with the hospitalist (Dr. Posey Pronto),  and discussed lab and imaging findings as well as pertinent plan - he will admit   Problem List / ED Course:  AMS:  this waxes and wanes.  Pt will be alert on one visit to the room, then confused the next.  He has a low grade fever with hypotension and tachycardia, so I ordered a code sepsis.  BP still low after fluids, so pt given additional fluids and iv abx. Covid pending.  Source is unclear.  Knee is not red with good rom.     Reevaluation:  After the interventions noted above, I reevaluated the patient and found that they have :improved   Social Determinants of Health:  Lives at home   Dispostion:  After consideration of the diagnostic results and the patients response to treatment, I feel that the patent would benefit from admission.    CRITICAL CARE Performed by: Isla Pence   Total critical care time: 30 minutes  Critical care time was exclusive of separately billable procedures and treating other patients.  Critical care was necessary to treat or prevent imminent or life-threatening deterioration.  Critical care was time spent personally by me on the following  activities: development of treatment plan with patient and/or surrogate as well as nursing, discussions with consultants, evaluation of patient's response to treatment, examination of patient, obtaining history from patient or surrogate, ordering and performing treatments and interventions, ordering and review of laboratory studies, ordering and review of radiographic studies, pulse oximetry and re-evaluation of patient's condition.         Final Clinical Impression(s) / ED Diagnoses Final diagnoses:  Sepsis, due to unspecified organism, unspecified whether acute organ dysfunction present [facility])  Dehydration    Rx / DC Orders ED Discharge Orders     None         Isla Pence, MD 04/01/22 2251

## 2022-04-01 NOTE — H&P (Signed)
History and Physical    Jordan [name] WLS:[id number] DOB: 1950/11/01 DOA: 04/01/2022  PCP: [name], MD  Patient coming from: Home via EMS  I have personally briefly reviewed patient's old medical records in [place]  Chief Complaint: Confusion  HPI: Jordan [name] is a 71 y.o. male with medical history significant for persistent atrial fibrillation on Eliquis, HFpEF (EF 55-60% on 07/14/2021), severe AS s/p TAVR, COPD, chronic respiratory failure with hypoxia on 2 L O2 via [place] prn, CAD, HTN, HLD, PVD, normocytic anemia, chronic venous stasis dermatitis, s/p right TKA 02/07/2022, depression/anxiety who presented to the ED from home for evaluation of confusion.  Patient was recently discharged to home where he lives alone from rehab facility.  He has had 24/7 home health nursing assistance at home.  His home health nurse noted that he was confused and having apparent hallucinations at nighttime therefore EMS were called and he was brought to the ED for further evaluation.  On admitting evaluation, patient is awake, alert, and fully oriented.  He does acknowledge feeling confused at nighttime and states that he was seen people who were not actually present in his home.  He recognized these people as his nurses from the rehab facility.  He reports recent issues with low blood pressure as well as intermittent episodes of cellulitis.  He has been having on and off pain with his right knee since his knee replacement on 8/21 but no significant changes recently.  He denies any subjective fevers, chills, diaphoresis, nausea, vomiting, chest pain, dyspnea, cough, abdominal pain, dysuria.  He reported recent issues with constipation but has been having regular bowel movements the last 4-5 days.  He states he uses 2 L supplemental O2 via [place] only as needed at home.  ED Course  Labs/Imaging on admission: I have personally reviewed following labs and imaging studies.  Initial showed BP  93/53, pulse 109, RR 16, temp 97.8 F, SPO2 96% on room air.  While in the ED patient tachypneic with RR up to 25 and tachycardic with HR up to 133.  Labs show sodium 136, potassium 3.7, bicarb 29, BUN 29, creatinine 1.24, serum glucose 84, AST 42, ALT 25, alk phos 103, total bilirubin 1.3, ammonia 18, lactic acid 1.8, WBC 3.8, hemoglobin 9.9, platelets 172,000.  Urinalysis negative for UTI.  Respiratory panel and single blood culture ordered and pending collection.  Portable chest x-ray negative for focal consolidation, edema, effusion.  Patient was given 2 L normal saline with additional 2.5 L LR ordered.  Patient was ordered to receive IV vancomycin, cefepime, Flagyl.  The hospitalist service was consulted to admit for further evaluation and management.  Review of Systems: All systems reviewed and are negative except as documented in history of present illness above.   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 Burt Knack and Dr. Cyndia Bent   Severe aortic stenosis    Severe aortic insufficiency, moderate to severe aortic stenosis   Sleep apnea    no longer uses cpap   Venous insufficiency    managed by ortho (Dr. Sharol Given)    Past Surgical History:  Procedure Laterality Date   ANKLE SURGERY Right    Dr. Marcelino Scot, has pins   CARDIOVERSION N/A 07/16/2021   Procedure: CARDIOVERSION;  Surgeon: Berniece Salines, DO;  Location: [facility]  ENDOSCOPY;  Service: Cardiovascular;  Laterality: N/A;   COLONOSCOPY W/ POLYPECTOMY     KNEE ARTHROSCOPY Right    Dr. Noemi Chapel   MULTIPLE EXTRACTIONS WITH ALVEOLOPLASTY Bilateral 05/22/2020   Procedure: MULTIPLE EXTRACTION WITH ALVEOLOPLASTY;  Surgeon: Diona Browner, DMD;  Location: [facility];  Service: Oral Surgery;  Laterality: Bilateral;   right foot surgery,rt great toe callus removal     RIGHT/LEFT HEART CATH AND CORONARY ANGIOGRAPHY N/A 03/06/2020   Procedure: RIGHT/LEFT HEART CATH AND CORONARY ANGIOGRAPHY;  Surgeon: Sherren Mocha, MD;  Location: [facility];  Service: Cardiovascular;  Laterality: N/A;   TEE WITHOUT CARDIOVERSION N/A 03/09/2020   Procedure: TRANSESOPHAGEAL ECHOCARDIOGRAM (TEE);  Surgeon: Dorothy Spark, MD;  Location: [facility] ENDOSCOPY;  Service: Cardiovascular;  Laterality: N/A;   TEE WITHOUT CARDIOVERSION N/A 06/30/2020   Procedure: TRANSESOPHAGEAL ECHOCARDIOGRAM (TEE);  Surgeon: Sherren Mocha, MD;  Location: [facility];  Service: Open Heart Surgery;  Laterality: N/A;   TONSILLECTOMY AND ADENOIDECTOMY     age 59   TOTAL KNEE ARTHROPLASTY Right 02/07/2022   Procedure: TOTAL KNEE ARTHROPLASTY;  Surgeon: Gaynelle Arabian, MD;  Location: [facility] OR[facility];  Service: Orthopedics;  Laterality: Right;   TRANSCATHETER AORTIC VALVE REPLACEMENT, TRANSFEMORAL N/A 06/30/2020   Procedure: TRANSCATHETER AORTIC VALVE REPLACEMENT, TRANSFEMORAL;  Surgeon: Sherren Mocha, MD;  Location: [facility];  Service: Open Heart Surgery;  Laterality: N/A;   UPPER GI ENDOSCOPY      Social History:  reports that he has been smoking cigarettes. He has a 13.75 pack-year smoking history. He has never used smokeless tobacco. He reports that he does not drink alcohol and does not use drugs.  Allergies  Allergen Reactions   Altace [Ramipril] Other (See Comments)    Dizziness, migraine, weakness- "there is dye in it"   Codeine Itching   Metoprolol Tartrate Other (See Comments)    Dropped the B/P too low- eventually  stopped by a MD   Naproxen Itching    Family History  Problem Relation Age of Onset   Hypertension Mother    Alzheimer's disease Mother    Stroke Father        x 3   Heart attack Father    Congestive Heart Failure Father    Diabetes Other        mat great aunts   Colon cancer Neg Hx    Rectal cancer Neg Hx    Esophageal cancer Neg Hx      Prior to Admission medications   Medication Sig Start Date End Date Taking? Authorizing Provider  ACETAMINOPHEN-BUTALBITAL 50-325 MG TABS Take 1 tablet by mouth 2 (two) times daily as needed (migraines).   Yes [provider]  albuterol (VENTOLIN HFA) 108 (90 Base) MCG/ACT inhaler Inhale 1-2 puffs into the lungs every 6 (six) hours as needed for wheezing or shortness of breath.   Yes [provider]  atenolol (TENORMIN) 50 MG tablet Take 50 mg by mouth daily.  Yes [provider]  atorvastatin (LIPITOR) 80 MG tablet Take 80 mg by mouth every other day.   Yes [provider]  bismuth subsalicylate (PEPTO BISMOL) 262 MG/15ML suspension Take 30 mLs by mouth every 6 (six) hours as needed for indigestion.   Yes [provider]  budesonide-formoterol (SYMBICORT) 160-4.5 MCG/ACT inhaler Inhale 2 puffs into the lungs in the morning and at bedtime. Patient taking differently: Inhale 2 puffs into the lungs 2 (two) times daily as needed (for shortness of breath or wheezing). 03/12/20  Yes Aline August, MD  Chlorphen-Pseudoephed-APAP (CORICIDIN D PO) Take 1 tablet by mouth daily as needed (for congestion).   Yes [provider]  doxepin (SINEQUAN) 50 MG capsule Take 50 mg by mouth at bedtime as needed (for sleep). 09/21/21  Yes [provider]  EFFEXOR XR 75 MG 24 hr capsule Take 75 mg by mouth daily with breakfast.   Yes [provider]  ELIQUIS 5 MG TABS tablet TAKE 1 TABLET BY MOUTH TWICE A DAY 02/25/22  Yes Eileen Stanford, PA-C  furosemide (LASIX) 40 MG tablet Take 1 tablet by mouth  every other day alternating with 1/2 tablet every other day Patient taking differently: Take 40 mg by mouth every other day. 03/16/21  Yes Weaver, [name] T, PA-C  Glycerin (CLEAR EYES ADV DRY & ITCHY RLF) 0.25 % SOLN Apply 1 drop to eye daily as needed (dry eyes).   Yes [provider]  ipratropium-albuterol (DUONEB) 0.5-2.5 (3) MG/3ML SOLN Take 3 mLs by nebulization every 6 (six) hours as needed (shortness of breath/wheezing). 03/12/20  Yes Aline August, MD  LORazepam (ATIVAN) 1 MG tablet Take 1 mg by mouth every 8 (eight) hours as needed for anxiety. 02/22/21  Yes [provider]  methocarbamol (ROBAXIN) 500 MG tablet Take 1 tablet (500 mg total) by mouth every 6 (six) hours as needed for muscle spasms. 02/08/22  Yes Edmisten, Kristie L, PA  montelukast (SINGULAIR) 10 MG tablet Take 10 mg by mouth as needed (for allergies). 02/07/20  Yes [provider]  Multiple Vitamin (MULTIVITAMIN WITH MINERALS) TABS Take 1 tablet by mouth daily with breakfast.   Yes [provider]  nystatin cream (MYCOSTATIN) APPLY TO AFFECTED AREA TWICE A DAY Patient taking differently: Apply 1 Application topically 2 (two) times daily as needed for dry skin. 07/20/21  Yes Eileen Stanford, PA-C  omeprazole (PRILOSEC OTC) 20 MG tablet Take 20 mg by mouth daily as needed (heartburn).   Yes [provider]  oxyCODONE (OXY IR/ROXICODONE) 5 MG immediate release tablet Take 1-2 tablets (5-10 mg total) by mouth every 6 (six) hours as needed for severe pain. 02/08/22  Yes Edmisten, Kristie L, PA  promethazine (PHENERGAN) 25 MG tablet Take 25 mg by mouth every 8 (eight) hours as needed for nausea/vomiting. 05/14/20  Yes [provider]  folic acid (FOLVITE) 1 MG tablet Take 1 tablet (1 mg total) by mouth daily. Patient not taking: Reported on 03/12/2022 11/04/14   [name], MD  Metoprolol Tartrate 75 MG TABS Take 37.5 mg by mouth 2 (two) times daily. Patient not taking: Reported  on 03/12/2022 11/03/21 03/12/22  [name] T, PA-C    Physical Exam: Vitals:   04/01/22 2000 04/01/22 2100 04/01/22 2200 04/01/22 2300  BP: (!) 96/58 98/66 (!) 89/56 96/82  Pulse:  (!) 133 (!) 57 67  Resp: (!) 25 18 16  (!) 25  Temp:    97.7 F (36.5 C)  TempSrc:  Oral  SpO2:  94% 93% 98%  Weight:      Height:       Constitutional: Chronically ill-appearing man resting in bed, NAD, calm, comfortable Eyes: EOMI, lids and conjunctivae normal ENMT: Mucous membranes are dry. Posterior pharynx clear of any exudate or lesions.Normal dentition.  Neck: normal, supple, no masses. Respiratory: clear to auscultation bilaterally, no wheezing, no crackles. Normal respiratory effort. No accessory muscle use.  Cardiovascular: Irregularly irregular, no murmurs / rubs / gallops. No extremity edema. Abdomen: no tenderness, no masses palpated. Musculoskeletal: Swelling right knee with mild warmth to touch without erythema, surgical site appears well-healed.  No clubbing / cyanosis. No joint deformity upper and lower extremities. Good ROM, no contractures. Skin: Chronic venous stasis dermatitis changes both lower extremities Neurologic:  Sensation intact. Strength equal bilaterally Psychiatric: Alert and oriented x 3. Normal mood.   EKG: Personally reviewed. Atrial fibrillation, rate 90, nonspecific T wave changes inferior leads.  T wave changes more pronounced when compared to prior.  Assessment/Plan Principal Problem:   SIRS (systemic inflammatory response syndrome) (HCC) Active Problems:   Delirium   Persistent atrial fibrillation (HCC)   Essential hypertension   Chronic heart failure with preserved ejection fraction (HCC)   COPD (chronic obstructive pulmonary disease) (HCC)   Chronic respiratory failure with hypoxia (HCC)   HLD (hyperlipidemia)   CAD (coronary artery disease)   Normocytic anemia   Depression with anxiety   Jordan [name] is a 71 y.o. male with medical history  significant for persistent atrial fibrillation on Eliquis, HFpEF (EF 55-60% on 07/14/2021), severe AS s/p TAVR, COPD, chronic respiratory failure with hypoxia on 2 L O2 via [place] prn, CAD, HTN, HLD, PVD, normocytic anemia, chronic venous stasis dermatitis, s/p right TKA 02/07/2022, depression/anxiety who is admitted with SIRS and hypotension.  Assessment and Plan: * SIRS (systemic inflammatory response syndrome) (HCC) Presenting with tachycardia, tachypnea, and hypotension.  WBC 3.8.  No obvious infectious source.  Urinalysis negative for UTI.  CXR negative for pneumonia.  Has some swelling to right knee without erythema, surgical site appears well-healed. -S/p IV vancomycin, cefepime, Flagyl -Hold further antibiotics for now -Follow blood culture and COVID/flu panel -Ultrasound right knee -Receiving aggressive IV fluid hydration; monitor strict I/O's and daily weights  Delirium History sounds more like delirious episode at nighttime after recent discharge back to home from rehab.  He is fully oriented at time of admission. -Delirium precautions  Persistent atrial fibrillation ([place]) Remains in atrial fibrillation with intermittent tachycardia. -Continue Eliquis -Holding atenolol due to hypotension  Chronic heart failure with preserved ejection fraction (HCC) Hypotensive and hypovolemic on admission.  Receiving IV fluid hydration.  Last EF 55-60% by TTE 07/14/2021. -Hold Lasix -Monitor strict I/O's and daily weights  Essential hypertension Holding antihypertensives due to hypotension.  Chronic respiratory failure with hypoxia (HCC) Uses 2 L O2 via [place] as needed at home.  Continue supplemental oxygen as needed.  COPD (chronic obstructive pulmonary disease) (HCC) Stable, continue Dulera, albuterol and DuoNebs as needed.  Depression with anxiety Continue Effexor and doxepin.  Normocytic anemia Hemoglobin stable at 9.9.  CAD (coronary artery disease) Continue atorvastatin and  Eliquis.  HLD (hyperlipidemia) Continue atorvastatin.  DVT prophylaxis:  apixaban (ELIQUIS) tablet 5 mg   Code Status: DNR, confirmed with patient on admission Family Communication: Discussed with patient, he has discussed with family Disposition Plan: From home, dispo pending clinical progress Consults called: None Severity of Illness: The appropriate patient status for this patient is OBSERVATION. Observation status is  judged to be reasonable and necessary in order to provide the required intensity of service to ensure the patient's safety. The patient's presenting symptoms, physical exam findings, and initial radiographic and laboratory data in the context of their medical condition is felt to place them at decreased risk for further clinical deterioration. Furthermore, it is anticipated that the patient will be medically stable for discharge from the hospital within 2 midnights of admission.   Zada Finders MD [facility]  If 7PM-7AM, please contact night-coverage [email or web address]  04/02/2022, 12:15 AM

## 2022-04-01 NOTE — ED Triage Notes (Addendum)
BIB EMS from home, [facility] tech called 911 d/t confusion and hallucinations. No fevers or tachycardia. recent discharge from assisted living. Alert to self, time, date, location. No family was present. Was evaluated at PCP today and urine was negative for UTI.   Initial BP 112/ palp, now BP is 84/52  Right leg at surgical site from knee replacement is tender, swollen and warm.

## 2022-04-02 ENCOUNTER — Inpatient Hospital Stay (HOSPITAL_COMMUNITY): Payer: Medicare PPO

## 2022-04-02 ENCOUNTER — Inpatient Hospital Stay: Payer: Self-pay

## 2022-04-02 ENCOUNTER — Encounter (HOSPITAL_COMMUNITY): Payer: Self-pay | Admitting: Internal Medicine

## 2022-04-02 ENCOUNTER — Observation Stay (HOSPITAL_COMMUNITY): Payer: Medicare PPO

## 2022-04-02 ENCOUNTER — Other Ambulatory Visit: Payer: Self-pay | Admitting: Physician Assistant

## 2022-04-02 DIAGNOSIS — F17210 Nicotine dependence, cigarettes, uncomplicated: Secondary | ICD-10-CM | POA: Diagnosis present

## 2022-04-02 DIAGNOSIS — E162 Hypoglycemia, unspecified: Secondary | ICD-10-CM | POA: Diagnosis present

## 2022-04-02 DIAGNOSIS — G43909 Migraine, unspecified, not intractable, without status migrainosus: Secondary | ICD-10-CM | POA: Diagnosis present

## 2022-04-02 DIAGNOSIS — D649 Anemia, unspecified: Secondary | ICD-10-CM | POA: Diagnosis present

## 2022-04-02 DIAGNOSIS — Z66 Do not resuscitate: Secondary | ICD-10-CM | POA: Diagnosis present

## 2022-04-02 DIAGNOSIS — E861 Hypovolemia: Secondary | ICD-10-CM | POA: Diagnosis present

## 2022-04-02 DIAGNOSIS — I5089 Other heart failure: Secondary | ICD-10-CM | POA: Diagnosis not present

## 2022-04-02 DIAGNOSIS — E876 Hypokalemia: Secondary | ICD-10-CM | POA: Diagnosis not present

## 2022-04-02 DIAGNOSIS — Z79899 Other long term (current) drug therapy: Secondary | ICD-10-CM | POA: Diagnosis not present

## 2022-04-02 DIAGNOSIS — F32A Depression, unspecified: Secondary | ICD-10-CM | POA: Diagnosis present

## 2022-04-02 DIAGNOSIS — J9621 Acute and chronic respiratory failure with hypoxia: Secondary | ICD-10-CM | POA: Diagnosis present

## 2022-04-02 DIAGNOSIS — M7989 Other specified soft tissue disorders: Secondary | ICD-10-CM | POA: Diagnosis not present

## 2022-04-02 DIAGNOSIS — I5032 Chronic diastolic (congestive) heart failure: Secondary | ICD-10-CM | POA: Diagnosis present

## 2022-04-02 DIAGNOSIS — R579 Shock, unspecified: Secondary | ICD-10-CM | POA: Diagnosis present

## 2022-04-02 DIAGNOSIS — I10 Essential (primary) hypertension: Secondary | ICD-10-CM | POA: Diagnosis not present

## 2022-04-02 DIAGNOSIS — J449 Chronic obstructive pulmonary disease, unspecified: Secondary | ICD-10-CM | POA: Diagnosis present

## 2022-04-02 DIAGNOSIS — J9622 Acute and chronic respiratory failure with hypercapnia: Secondary | ICD-10-CM | POA: Diagnosis present

## 2022-04-02 DIAGNOSIS — I110 Hypertensive heart disease with heart failure: Secondary | ICD-10-CM | POA: Diagnosis present

## 2022-04-02 DIAGNOSIS — R627 Adult failure to thrive: Secondary | ICD-10-CM | POA: Diagnosis present

## 2022-04-02 DIAGNOSIS — G9340 Encephalopathy, unspecified: Secondary | ICD-10-CM | POA: Diagnosis not present

## 2022-04-02 DIAGNOSIS — I4891 Unspecified atrial fibrillation: Secondary | ICD-10-CM | POA: Diagnosis not present

## 2022-04-02 DIAGNOSIS — E785 Hyperlipidemia, unspecified: Secondary | ICD-10-CM | POA: Diagnosis present

## 2022-04-02 DIAGNOSIS — R4182 Altered mental status, unspecified: Secondary | ICD-10-CM | POA: Diagnosis present

## 2022-04-02 DIAGNOSIS — I5030 Unspecified diastolic (congestive) heart failure: Secondary | ICD-10-CM | POA: Diagnosis not present

## 2022-04-02 DIAGNOSIS — R6510 Systemic inflammatory response syndrome (SIRS) of non-infectious origin without acute organ dysfunction: Secondary | ICD-10-CM | POA: Diagnosis not present

## 2022-04-02 DIAGNOSIS — F419 Anxiety disorder, unspecified: Secondary | ICD-10-CM | POA: Diagnosis present

## 2022-04-02 DIAGNOSIS — I2510 Atherosclerotic heart disease of native coronary artery without angina pectoris: Secondary | ICD-10-CM | POA: Diagnosis present

## 2022-04-02 DIAGNOSIS — R64 Cachexia: Secondary | ICD-10-CM | POA: Diagnosis present

## 2022-04-02 DIAGNOSIS — I739 Peripheral vascular disease, unspecified: Secondary | ICD-10-CM | POA: Diagnosis present

## 2022-04-02 DIAGNOSIS — I4819 Other persistent atrial fibrillation: Secondary | ICD-10-CM | POA: Diagnosis present

## 2022-04-02 DIAGNOSIS — U071 COVID-19: Secondary | ICD-10-CM | POA: Diagnosis present

## 2022-04-02 DIAGNOSIS — Z952 Presence of prosthetic heart valve: Secondary | ICD-10-CM | POA: Diagnosis not present

## 2022-04-02 DIAGNOSIS — J9611 Chronic respiratory failure with hypoxia: Secondary | ICD-10-CM | POA: Diagnosis not present

## 2022-04-02 LAB — MRSA NEXT GEN BY PCR, NASAL: MRSA by PCR Next Gen: NOT DETECTED

## 2022-04-02 LAB — BLOOD GAS, ARTERIAL
Acid-Base Excess: 4.2 mmol/L — ABNORMAL HIGH (ref 0.0–2.0)
Bicarbonate: 28.4 mmol/L — ABNORMAL HIGH (ref 20.0–28.0)
Drawn by: 27021
FIO2: 4 %
O2 Saturation: 88.3 %
Patient temperature: 37.5
pCO2 arterial: 41 mmHg (ref 32–48)
pH, Arterial: 7.45 (ref 7.35–7.45)
pO2, Arterial: 58 mmHg — ABNORMAL LOW (ref 83–108)

## 2022-04-02 LAB — COMPREHENSIVE METABOLIC PANEL
ALT: 27 U/L (ref 0–44)
AST: 45 U/L — ABNORMAL HIGH (ref 15–41)
Albumin: 2.9 g/dL — ABNORMAL LOW (ref 3.5–5.0)
Alkaline Phosphatase: 101 U/L (ref 38–126)
Anion gap: 8 (ref 5–15)
BUN: 25 mg/dL — ABNORMAL HIGH (ref 8–23)
CO2: 28 mmol/L (ref 22–32)
Calcium: 7.9 mg/dL — ABNORMAL LOW (ref 8.9–10.3)
Chloride: 103 mmol/L (ref 98–111)
Creatinine, Ser: 1.15 mg/dL (ref 0.61–1.24)
GFR, Estimated: >60 mL/min (ref >60)
Glucose, Bld: 86 mg/dL (ref 70–99)
Potassium: 3.6 mmol/L (ref 3.5–5.1)
Sodium: 139 mmol/L (ref 135–145)
Total Bilirubin: 1.2 mg/dL (ref 0.3–1.2)
Total Protein: 5.8 g/dL — ABNORMAL LOW (ref 6.5–8.1)

## 2022-04-02 LAB — RESP PANEL BY RT-PCR (FLU A&B, COVID) ARPGX2
Influenza A by PCR: NEGATIVE
Influenza B by PCR: NEGATIVE
SARS Coronavirus 2 by RT PCR: POSITIVE — AB

## 2022-04-02 LAB — CK TOTAL AND CKMB (NOT AT ARMC)
CK, MB: 2.3 ng/mL (ref 0.5–5.0)
Relative Index: 1.9 (ref 0.0–2.5)
Total CK: 122 U/L (ref 49–397)

## 2022-04-02 LAB — CBC
HCT: 32.9 % — ABNORMAL LOW (ref 39.0–52.0)
HCT: 31.7 % — ABNORMAL LOW (ref 39.0–52.0)
Hemoglobin: 10.2 g/dL — ABNORMAL LOW (ref 13.0–17.0)
Hemoglobin: 9.8 g/dL — ABNORMAL LOW (ref 13.0–17.0)
MCH: 27.3 pg (ref 26.0–34.0)
MCH: 27.1 pg (ref 26.0–34.0)
MCHC: 31 g/dL (ref 30.0–36.0)
MCHC: 30.9 g/dL (ref 30.0–36.0)
MCV: 88.2 fL (ref 80.0–100.0)
MCV: 87.6 fL (ref 80.0–100.0)
Platelets: 161 10*3/uL (ref 150–400)
Platelets: 198 10*3/uL (ref 150–400)
RBC: 3.73 MIL/uL — ABNORMAL LOW (ref 4.22–5.81)
RBC: 3.62 MIL/uL — ABNORMAL LOW (ref 4.22–5.81)
RDW: 15.6 % — ABNORMAL HIGH (ref 11.5–15.5)
RDW: 15.8 % — ABNORMAL HIGH (ref 11.5–15.5)
WBC: 6.7 10*3/uL (ref 4.0–10.5)
WBC: 6.8 10*3/uL (ref 4.0–10.5)
nRBC: 0 % (ref 0.0–0.2)
nRBC: 0 % (ref 0.0–0.2)

## 2022-04-02 LAB — C-REACTIVE PROTEIN: CRP: 4.6 mg/dL — ABNORMAL HIGH (ref <1.0)

## 2022-04-02 LAB — TROPONIN I (HIGH SENSITIVITY): Troponin I (High Sensitivity): 25 ng/L — ABNORMAL HIGH (ref <18)

## 2022-04-02 LAB — BRAIN NATRIURETIC PEPTIDE: B Natriuretic Peptide: 613.9 pg/mL — ABNORMAL HIGH (ref 0.0–100.0)

## 2022-04-02 LAB — PROCALCITONIN
Procalcitonin: <0.1 ng/mL
Procalcitonin: <0.1 ng/mL

## 2022-04-02 LAB — LACTIC ACID, PLASMA: Lactic Acid, Venous: 1.1 mmol/L (ref 0.5–1.9)

## 2022-04-02 LAB — CORTISOL: Cortisol, Plasma: 35.5 ug/dL

## 2022-04-02 LAB — GLUCOSE, CAPILLARY: Glucose-Capillary: 82 mg/dL (ref 70–99)

## 2022-04-02 LAB — D-DIMER, QUANTITATIVE: D-Dimer, Quant: 1.77 ug/mL-FEU — ABNORMAL HIGH (ref 0.00–0.50)

## 2022-04-02 LAB — SEDIMENTATION RATE: Sed Rate: 17 mm/hr — ABNORMAL HIGH (ref 0–16)

## 2022-04-02 MED ORDER — SENNOSIDES-DOCUSATE SODIUM 8.6-50 MG PO TABS
1.0000 | ORAL_TABLET | Freq: Every evening | ORAL | Status: DC | PRN
  Administered 2022-04-11: 1 via ORAL
  Filled 2022-04-02: qty 1

## 2022-04-02 MED ORDER — IPRATROPIUM-ALBUTEROL 0.5-2.5 (3) MG/3ML IN SOLN
3.0000 mL | Freq: Three times a day (TID) | RESPIRATORY_TRACT | Status: DC
  Administered 2022-04-03: 3 mL via RESPIRATORY_TRACT
  Filled 2022-04-02: qty 3

## 2022-04-02 MED ORDER — PHENYLEPHRINE HCL-NACL 20-0.9 MG/250ML-% IV SOLN: 0.0000 ug/min | INTRAVENOUS | Status: DC

## 2022-04-02 MED ORDER — APIXABAN 5 MG PO TABS
5.0000 mg | ORAL_TABLET | Freq: Two times a day (BID) | ORAL | Status: DC
  Administered 2022-04-02: 5 mg via ORAL
  Filled 2022-04-02: qty 1

## 2022-04-02 MED ORDER — ATORVASTATIN CALCIUM 40 MG PO TABS
80.0000 mg | ORAL_TABLET | ORAL | Status: DC
  Administered 2022-04-02 – 2022-04-12 (×6): 80 mg via ORAL
  Filled 2022-04-02 (×8): qty 2

## 2022-04-02 MED ORDER — SODIUM CHLORIDE 0.9 % IV SOLN
25.0000 ug/min | INTRAVENOUS | Status: DC
  Administered 2022-04-02: 35 ug/min via INTRAVENOUS
  Administered 2022-04-02: 25 ug/min via INTRAVENOUS
  Filled 2022-04-02 (×2): qty 2

## 2022-04-02 MED ORDER — IPRATROPIUM-ALBUTEROL 0.5-2.5 (3) MG/3ML IN SOLN: 3.0000 mL | Freq: Four times a day (QID) | RESPIRATORY_TRACT | Status: DC | PRN

## 2022-04-02 MED ORDER — MOMETASONE FURO-FORMOTEROL FUM 200-5 MCG/ACT IN AERO
2.0000 | INHALATION_SPRAY | Freq: Two times a day (BID) | RESPIRATORY_TRACT | Status: DC
  Administered 2022-04-02: 2 via RESPIRATORY_TRACT
  Filled 2022-04-02: qty 8.8

## 2022-04-02 MED ORDER — MOLNUPIRAVIR EUA 200MG CAPSULE
4.0000 | ORAL_CAPSULE | Freq: Two times a day (BID) | ORAL | Status: AC
  Administered 2022-04-02 – 2022-04-06 (×9): 800 mg via ORAL
  Filled 2022-04-02: qty 4

## 2022-04-02 MED ORDER — ACETAMINOPHEN 650 MG RE SUPP: 650.0000 mg | Freq: Four times a day (QID) | RECTAL | Status: DC | PRN

## 2022-04-02 MED ORDER — ONDANSETRON HCL 4 MG/2ML IJ SOLN: 4.0000 mg | Freq: Four times a day (QID) | INTRAMUSCULAR | Status: DC | PRN

## 2022-04-02 MED ORDER — LACTATED RINGERS IV BOLUS
1000.0000 mL | Freq: Once | INTRAVENOUS | Status: AC
  Administered 2022-04-02: 1000 mL via INTRAVENOUS

## 2022-04-02 MED ORDER — SODIUM CHLORIDE 0.9% FLUSH
3.0000 mL | Freq: Two times a day (BID) | INTRAVENOUS | Status: DC
  Administered 2022-04-02 – 2022-04-12 (×20): 3 mL via INTRAVENOUS

## 2022-04-02 MED ORDER — PHENYLEPHRINE HCL-NACL 20-0.9 MG/250ML-% IV SOLN
INTRAVENOUS | Status: AC
  Filled 2022-04-02: qty 250

## 2022-04-02 MED ORDER — DEXTROSE 10 % IV SOLN: INTRAVENOUS | Status: DC

## 2022-04-02 MED ORDER — IPRATROPIUM-ALBUTEROL 0.5-2.5 (3) MG/3ML IN SOLN
3.0000 mL | RESPIRATORY_TRACT | Status: DC
  Administered 2022-04-02 (×3): 3 mL via RESPIRATORY_TRACT
  Filled 2022-04-02 (×3): qty 3

## 2022-04-02 MED ORDER — ALBUTEROL SULFATE HFA 108 (90 BASE) MCG/ACT IN AERS: 1.0000 | INHALATION_SPRAY | Freq: Four times a day (QID) | RESPIRATORY_TRACT | Status: DC | PRN

## 2022-04-02 MED ORDER — METHYLPREDNISOLONE SODIUM SUCC 125 MG IJ SOLR: 60.0000 mg | Freq: Three times a day (TID) | INTRAMUSCULAR | Status: DC

## 2022-04-02 MED ORDER — PANTOPRAZOLE SODIUM 40 MG IV SOLR
40.0000 mg | Freq: Every day | INTRAVENOUS | Status: DC
  Administered 2022-04-02: 40 mg via INTRAVENOUS
  Filled 2022-04-02: qty 10

## 2022-04-02 MED ORDER — ONDANSETRON HCL 4 MG PO TABS: 4.0000 mg | ORAL_TABLET | Freq: Four times a day (QID) | ORAL | Status: DC | PRN

## 2022-04-02 MED ORDER — CHLORHEXIDINE GLUCONATE CLOTH 2 % EX PADS
6.0000 | MEDICATED_PAD | Freq: Every day | CUTANEOUS | Status: DC
  Administered 2022-04-02 – 2022-04-12 (×11): 6 via TOPICAL

## 2022-04-02 MED ORDER — METHYLPREDNISOLONE SODIUM SUCC 125 MG IJ SOLR
90.0000 mg | Freq: Two times a day (BID) | INTRAMUSCULAR | Status: DC
  Administered 2022-04-02 – 2022-04-03 (×2): 90 mg via INTRAVENOUS
  Filled 2022-04-02 (×2): qty 2

## 2022-04-02 MED ORDER — ACETAMINOPHEN 325 MG PO TABS
650.0000 mg | ORAL_TABLET | Freq: Four times a day (QID) | ORAL | Status: DC | PRN
  Filled 2022-04-02: qty 2

## 2022-04-02 MED ORDER — ATENOLOL 25 MG PO TABS
25.0000 mg | ORAL_TABLET | Freq: Every day | ORAL | Status: DC
  Filled 2022-04-02: qty 1

## 2022-04-02 MED ORDER — SODIUM CHLORIDE 0.9 % IV SOLN: 250.0000 mL | INTRAVENOUS | Status: DC

## 2022-04-02 MED ORDER — SODIUM CHLORIDE 0.9 % IV SOLN: INTRAVENOUS | Status: DC | PRN

## 2022-04-02 MED ORDER — DOXEPIN HCL 50 MG PO CAPS: 50.0000 mg | ORAL_CAPSULE | Freq: Every evening | ORAL | Status: DC | PRN

## 2022-04-02 MED ORDER — VENLAFAXINE HCL ER 75 MG PO CP24
75.0000 mg | ORAL_CAPSULE | Freq: Every day | ORAL | Status: DC
  Administered 2022-04-02 – 2022-04-12 (×11): 75 mg via ORAL
  Filled 2022-04-02 (×11): qty 1

## 2022-04-02 MED ORDER — LACTATED RINGERS IV SOLN: INTRAVENOUS | Status: DC

## 2022-04-02 NOTE — Assessment & Plan Note (Signed)
Hypotensive and hypovolemic on admission.  Receiving IV fluid hydration.  Last EF 55-60% by TTE 07/14/2021. -Hold Lasix -Monitor strict I/O's and daily weights

## 2022-04-02 NOTE — Assessment & Plan Note (Signed)
Remains in atrial fibrillation with intermittent tachycardia. -Continue Eliquis -Holding atenolol due to hypotension

## 2022-04-02 NOTE — Progress Notes (Addendum)
PICC insertion: LUE used due to limited ROM in R shoulder.  Xray result reviewed. Line pulled back 3cm. RN notified OK to use PICC.

## 2022-04-02 NOTE — Progress Notes (Signed)
Peripherally Inserted Central Catheter Placement  The IV Nurse has discussed with the patient and/or persons authorized to consent for the patient, the purpose of this procedure and the potential benefits and risks involved with this procedure.  The benefits include less needle sticks, lab draws from the catheter, and the patient may be discharged home with the catheter. Risks include, but not limited to, infection, bleeding, blood clot (thrombus formation), and puncture of an artery; nerve damage and irregular heartbeat and possibility to perform a PICC exchange if needed/ordered by physician.  Alternatives to this procedure were also discussed.  B[facility] Power PICC patient education guide, fact sheet on infection prevention and patient information card has been provided to patient /or left at bedside.    PICC Placement Documentation  PICC Triple Lumen 95/74/73 Left Basilic 44 cm 0 cm (Active)  Indication for Insertion or Continuance of Line Vasoactive infusions 04/02/22 1838  Exposed Catheter (cm) 0 cm 04/02/22 1838  Site Assessment Clean, Dry, Intact 04/02/22 1838  Lumen #1 Status Saline locked;Flushed;Blood return noted 04/02/22 1838  Lumen #2 Status Saline locked;Flushed;Blood return noted 04/02/22 1838  Lumen #3 Status Saline locked;Flushed;Blood return noted 04/02/22 1838  Dressing Type Transparent 04/02/22 1838  Dressing Status Antimicrobial disc in place;Clean, Dry, Intact 04/02/22 1838  Safety Lock Not Applicable 40/37/09 6438  Line Care Connections checked and tightened 04/02/22 1838  Line Adjustment (NICU/IV Team Only) No 04/02/22 1838  Dressing Intervention New dressing 04/02/22 1838  Dressing Change Due 04/09/22 04/02/22 1838       Rosalio Macadamia Chenice 04/02/2022, 6:40 PM

## 2022-04-02 NOTE — Consult Note (Signed)
NAME:  Jordan [name], MRN:  [id number], DOB:  [birth year], LOS: 0 ADMISSION DATE:  04/01/2022, CONSULTATION DATE:  04/02/22 REFERRING MD:  Dr Randall Hiss [place] CHIEF COMPLAINT:  COvid, resp failure, Circulatory shock, ACute obtunded encephalopathy  Adddress: [place] [phone]  PCP [name], MD   History of Present Illness: -Patient unable to provide history no family member at the bedside.  History from review of the chart and referring physician Dr. [place]  71 year old male with COPD not otherwise specified but appears to be advanced with 2 L oxygen.  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  At one point in time he suffered from severe obesity and that was in 2015.  He was discharged late August 2023 after total knee arthroplasty.  Appears he might of gone to a SNF and then from the admitted to his home [recently].  It appears on 03/31/2022 was having some hallucinations [later ascertained as going on for a few weeks at least] and is brought into the emergency department.  In the ED on 04/01/2022 initially hypotensive and tachycardic pulse ox 96% on room air with a creatinine of 1.2 mg percent he was given 2 L of fluids and started on broad antibiotics.  Blood sugar was in the 60s.  Admitted for SIRS with hypotension suspected sepsis.  Chest x-ray 15 negative for pneumonia.  He is DNR but full medical care at admission.  Post admission COVID is positive started on Molnupiravir.  Then on 04/02/2022 she had acute worsening of the mental status to obtundation along with circulatory shock despite fluid bolus.  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   reports that he has been smoking cigarettes. He has a 13.75 pack-year smoking history. He has never used smokeless tobacco.  Past Surgical History:  Procedure Laterality Date   ANKLE SURGERY Right    Dr. Marcelino Scot, has pins   CARDIOVERSION N/A 07/16/2021   Procedure: CARDIOVERSION;  Surgeon: Berniece Salines, DO;  Location: [facility];  Service: Cardiovascular;  Laterality: N/A;   COLONOSCOPY W/ POLYPECTOMY     KNEE ARTHROSCOPY Right    Dr. Noemi Chapel   MULTIPLE EXTRACTIONS WITH ALVEOLOPLASTY Bilateral 05/22/2020   Procedure: MULTIPLE EXTRACTION WITH ALVEOLOPLASTY;  Surgeon: Diona Browner, DMD;  Location: [facility];  Service: Oral Surgery;  Laterality: Bilateral;   right foot surgery,rt great toe callus removal     RIGHT/LEFT HEART CATH AND CORONARY ANGIOGRAPHY N/A 03/06/2020   Procedure: RIGHT/LEFT HEART CATH AND CORONARY ANGIOGRAPHY;  Surgeon: Sherren Mocha, MD;  Location: [facility];  Service: Cardiovascular;  Laterality: N/A;   TEE WITHOUT CARDIOVERSION N/A 03/09/2020   Procedure: TRANSESOPHAGEAL ECHOCARDIOGRAM (TEE);  Surgeon: Dorothy Spark, MD;  Location: [facility] ENDOSCOPY;  Service: Cardiovascular;  Laterality: N/A;   TEE WITHOUT CARDIOVERSION N/A 06/30/2020   Procedure: TRANSESOPHAGEAL ECHOCARDIOGRAM (TEE);  Surgeon: Sherren Mocha, MD;  Location: [facility];  Service: Open Heart Surgery;  Laterality: N/A;   TONSILLECTOMY AND ADENOIDECTOMY     age 23   TOTAL KNEE ARTHROPLASTY Right 02/07/2022   Procedure: TOTAL KNEE ARTHROPLASTY;  Surgeon: Gaynelle Arabian, MD;  Location: [facility];  Service: Orthopedics;  Laterality: Right;   TRANSCATHETER AORTIC VALVE REPLACEMENT, TRANSFEMORAL N/A 06/30/2020   Procedure: TRANSCATHETER AORTIC VALVE REPLACEMENT, TRANSFEMORAL;  Surgeon: Sherren Mocha, MD;  Location: [facility];  Service: Open Heart Surgery;  Laterality: N/A;   UPPER GI ENDOSCOPY      Allergies  Allergen Reactions   Altace [Ramipril] Other (See Comments)    Dizziness, migraine, weakness- "there is dye in it"   Codeine Itching   Metoprolol Tartrate Other (See Comments)    Dropped the B/P too low- eventually stopped by a MD   Naproxen Itching    Immunization History  Administered Date(s) Administered   Fluad Quad(high Dose 65+) 03/09/2020, 02/26/2021   PFIZER(Purple Top)SARS-COV-2 Vaccination 07/10/2019, 07/31/2019    Family History  Problem Relation Age of Onset   Hypertension Mother    Alzheimer's disease Mother    Stroke Father        x 3   Heart attack Father    Congestive Heart Failure Father    Diabetes Other        mat great aunts   Colon cancer Neg Hx    Rectal cancer Neg Hx    Esophageal cancer Neg Hx      Current Facility-Administered Medications:    0.9 %  sodium chloride infusion, 250 mL, Intravenous, Continuous, [place] Eric J, DO   acetaminophen (TYLENOL) tablet 650 mg, 650 mg, Oral, Q6H PRN **OR** acetaminophen (TYLENOL) suppository 650 mg, 650 mg, Rectal, Q6H PRN, Lenore Cordia, MD   apixaban (ELIQUIS) tablet 5 mg, 5 mg, Oral, BID, Zada Finders R, MD, 5 mg at 04/02/22 0808   atorvastatin (LIPITOR) tablet 80 mg, 80 mg, Oral, QODAY, Patel, Cleaster Corin, MD, 80 mg at 04/02/22 0808   Chlorhexidine Gluconate Cloth 2 % PADS 6 each, 6 each, Topical, Daily, [place] Donnamarie Poag, DO, 6 each at 04/02/22 1358   doxepin (SINEQUAN) capsule 50 mg, 50 mg, Oral, QHS PRN, Posey Pronto, Vishal R, MD   ipratropium-albuterol (DUONEB) 0.5-2.5 (3) MG/3ML nebulizer solution 3 mL, 3 mL, Nebulization, Q6H PRN, Lenore Cordia, MD   lactated  ringers infusion, , Intravenous, Continuous, [place] Eric J, DO, Last Rate: 75 mL/hr at 04/02/22 0704, New Bag at 04/02/22 0704   molnupiravir EUA (LAGEVRIO) capsule 800 mg, 4 capsule, Oral, BID, [place] Eric J, DO, 800 mg at 04/02/22 1250   mometasone-formoterol (DULERA) 200-5 MCG/ACT inhaler 2 puff, 2 puff, Inhalation, BID, Zada Finders R, MD, 2 puff at 04/02/22 1251   ondansetron (ZOFRAN) tablet 4 mg, 4 mg, Oral, Q6H PRN **OR** ondansetron (ZOFRAN) injection 4 mg, 4 mg, Intravenous, Q6H PRN, Posey Pronto, Vishal R, MD   phenylephrine (NEO-SYNEPHRINE) 20 mg in sodium chloride 0.9 % 250 mL (0.08 mg/mL) infusion, 25-200 mcg/min, Intravenous, Titrated, [place] Eric J, DO   phenylephrine (NEOSYNEPHRINE) 20-0.9 MG/250ML-% infusion, , , ,    senna-docusate (Senokot-S) tablet 1 tablet, 1 tablet, Oral, QHS PRN, Posey Pronto, Vishal R, MD   sodium chloride flush (NS) 0.9 % injection 3 mL, 3 mL, Intravenous, Q12H, Patel, Vishal R, MD, 3 mL at 04/02/22 0809   venlafaxine XR (EFFEXOR-XR) 24 hr capsule 75 mg, 75 mg, Oral, Q breakfast, Zada Finders R, MD, 75 mg at 04/02/22 3734     Latest Ref Rng & Units 03/10/2020  8:21 AM  PFT Results  FVC-Pre L 2.49   FVC-Predicted Pre % 49   Pre FEV1/FVC % % 49   FEV1-Pre L 1.23   FEV1-Predicted Pre % 33   DLCO uncorrected ml/min/mmHg 11.82   DLCO UNC% % 41   DLCO corrected ml/min/mmHg 13.08   DLCO COR %Predicted % 45   DLVA Predicted % 83   TLC L 6.01   TLC % Predicted % 78   RV % Predicted % 137       Significant Hospital Events:  04/01/2022 - admit   Interim History / Subjective:   04/02/2022 - seen in bed [facility] ICU  Objective   Blood pressure (!) 81/41, pulse 92, temperature 99.5 F (37.5 C), temperature source Axillary, resp. rate (!) 22, height '6\' 1"'$  (1.854 m), weight 73.6 kg, SpO2 99 %.        Intake/Output Summary (Last 24 hours) at 04/02/2022 1406 Last data filed at 04/02/2022 0700 Gross per 24 hour  Intake 1011.85 ml  Output 1200  ml  Net -188.15 ml   Filed Weights   04/02/22 0124 04/02/22 1355 04/02/22 1400  Weight: 76.9 kg 73.6 kg 73.6 kg    Examination: Frail cachectic deep emaciated male.  Lying in the bed.  He has significant barrel chest classic with COPD.  Has hyperresonance on percussion consistent with COPD.  He is obtunded.  He is able to open his mouth on repeated command but is RASS sedation score appears to be at least -3-/-4 equivanet.  He appears to have myoclonic jerks or asterixis.  Abdomen is soft normal heart sounds.  There is very diminished air entry in his lung fields.   Resolved Hospital Problem list   x Assessment & Plan:  ASSESSMENT / PLAN:  PULMONARY  A:  Gold stage III COPD with chronic hypoxemic respiratory failure FEV1 33% 2021 2 L nasal cannula but no hypercapnia -Prior to & Present on Admit  Acute on chronic hypoxemic plus minus hypercapnic respiratory failure - 04/02/22  04/02/2022 -> silent chest.  Having asterixis.  Concern for respiratory acidosis and hypercapnia  P:   DO NOT INTUBATE full CODE STATUS Start BiPAP [might be too little too late] Check ABG Bronchodilators IV steroids Steroids for possible COVID-related exacerbation.   NEUROLOGIC A:   History of chronic pain on oxycodone as needed at baseline History of depression on Effexor and doxepin History of hallucinations otherwise.  Prior to admission Acute worsening encephalopathy with obtundation 04/02/2022   - 04/02/2022 rule out head bleed versus hypoglycemia versus metabolic issues  P:   Start D10 infusion Get ABG Check lactic acid Check head CT [hold Eliquis till then] Hold home antidepressants   VASCULAR A:   History of hypertension on atenolol and?  Also metoprolol New onset circulatory shock 04/02/2022.  No obvious bleeding?  Sepsis versus hypovolemia    P:  Fluid bolus Neo-Synephrine Check cortisol  CARDIAC STRUCTURAL A: Status post TAVR with ejection fraction 55% in January  2023   P: Cycle cardiac enzymes for the hospitalist Echo per the hospitalist  CARDIAC ELECTRICAL A: Persistent atrial fibrillation -baseline on beta-blocker and also Eliquis  04/02/2022 rapid ventricular rate associated with hypotension heart rate 130  P: Hydrate and reassess Hold Eliquis  INFECTIOUS A:   Acute COVID-19 upon admission  04/02/2022 -started antiviral  P:   Check sed rate, CRP, D-dimer -> evaluate for role of Tocilizumab Steroids for COPD exacerbation Antiviral according to the hospitalist  REGASTROINTESTINAL A:  N.p.o.   P:   PPI  HEMATOLOGIC   - HEME A:  Chronic anemia baseline hemoglobin around 10 g% same as admission   P:  - PRBC for hgb </= 6.9gm%    - exceptions are   -  if ACS susepcted/confirmed then transfuse for hgb </= 8.0gm%,  or    -  active bleeding with hemodynamic instability, then transfuse regardless of hemoglobin value   At at all times try to transfuse 1 unit prbc as possible with exception of active hemorrhage   MSK/DERM Appears to have failure to thrive  Plan  -Per the hospitalist    Best practice (daily eval):  Diet: N.p.o. Pain/Anxiety/Delirium protocol (if indicated): X VAP protocol (if indicated): Not applicable DVT prophylaxis: Holding Eliquis for atrial fibrillation till mental status issues sorted out GI prophylaxis: PPI Glucose control: Hypoglycemic and starting D10 Mobility: Bedrest Code Status: DNR according to hospitalist Family Communication: According to hospitalist Disposition: ICU  CCM consulted hospitalist primary for now     [place]   The patient LAMARKUS NEBEL is critically ill with multiple organ systems failure and requires high complexity decision making for assessment and support, frequent evaluation and titration of therapies, application of advanced monitoring technologies and extensive interpretation of multiple databases.   Critical Care Time devoted to  patient care services described in this note is  60  Minutes. This time reflects time of care of this signee Dr Brand Males. This critical care time does not reflect procedure time, or teaching time or supervisory time of PA/NP/Med student/Med Resident etc but could involve care discussion time      SIGNATURE    Dr. Brand Males, M.D., F.C.C.P,  Pulmonary and Critical Care Medicine Staff Physician, [place] Director - Interstitial Lung Disease  Program  Pulmonary [place] [facility] [place], [place], [address]  NPI Number:  NPI #[id number]  Pager: [phone], If no answer  -> Check AMION or Try [phone] Telephone (clinical office): [phone] Telephone (research): [phone]  2:06 PM 04/02/2022   04/02/2022 2:06 PM    LABS    PULMONARY No results for input(s): "PHART", "PCO2ART", "PO2ART", "HCO3", "TCO2", "O2SAT" in the last 168 hours.  Invalid input(s): "PCO2", "PO2"  CBC Recent Labs  Lab 04/01/22 1940 04/02/22 0405  HGB 9.9* 10.2*  HCT 31.7* 32.9*  WBC 3.8* 6.7  PLT 172 161    COAGULATION No results for input(s): "INR" in the last 168 hours.  CARDIAC  No results for input(s): "TROPONINI" in the last 168 hours. No results for input(s): "PROBNP" in the last 168 hours.  CHEMISTRY Recent Labs  Lab 04/01/22 1940 04/02/22 0405  NA 136 139  K 3.7 3.6  CL 100 103  CO2 29 28  GLUCOSE 84 86  BUN 29* 25*  CREATININE 1.24 1.15  CALCIUM 8.1* 7.9*   Estimated Creatinine Clearance: 61.3 mL/min (by C-G formula based on SCr of 1.15 mg/dL).   LIVER Recent Labs  Lab 04/01/22 1940 04/02/22 0405  AST 42* 45*  ALT 25 27  ALKPHOS 103 101  BILITOT 1.3* 1.2  PROT 6.0* 5.8*  ALBUMIN 3.2* 2.9*     INFECTIOUS Recent Labs  Lab 04/01/22 1940 04/02/22 0405  LATICACIDVEN 1.8  --   PROCALCITON  --  <0.10     ENDOCRINE CBG (last 3)  Recent Labs    04/01/22 1926  GLUCAP 65*  IMAGING x48h  - image(s) personally visualized  -   highlighted in bold [place] LIMITED JOINT SPACE STRUCTURES LOW RIGHT  Result Date: 04/02/2022 CLINICAL DATA:  History of TKA 2 months ago, presenting with swelling of the right knee joint. EXAM: ULTRASOUND RIGHT LOWER EXTREMITY LIMITED TECHNIQUE: Ultrasound examination of the lower extremity soft tissues was performed in the area of clinical concern. COMPARISON:  None Available. FINDINGS: Joint Space: No effusion. Muscles: Normal. Tendons: Normal Other Soft Tissue Structures: Normal. IMPRESSION: Unremarkable RIGHT lower extremity ultrasound. Electronically Signed   By: Virgina Norfolk M.D.   On: 04/02/2022 00:42   DG Chest Portable 1 View  Result Date: 04/01/2022 CLINICAL DATA:  Altered mental status.  Confusion. EXAM: PORTABLE CHEST 1 VIEW COMPARISON:  Radiograph 02/23/2021 FINDINGS: Patient's chin partially obscures the right lung apex. Lung volumes are low. Mild chronic cardiomegaly. TAVR. No acute airspace disease, pulmonary edema, large pleural effusion or pneumothorax. IMPRESSION: Low lung volumes. Mild chronic cardiomegaly. Electronically Signed   By: Keith Rake M.D.   On: 04/01/2022 19:57

## 2022-04-02 NOTE — Assessment & Plan Note (Signed)
Holding antihypertensives due to hypotension.

## 2022-04-02 NOTE — Plan of Care (Signed)

## 2022-04-02 NOTE — Progress Notes (Addendum)
PROGRESS NOTE    Jordan [name]  MGQ:[id number] DOB: 09-16-1950 DOA: 04/01/2022 PCP: [name], MD    Brief Narrative:   Jordan [name] is a 71 y.o. male with past medical history significant for persistent atrial fibrillation on Eliquis, HFpEF (EF 55-60% on 07/14/2021), severe AS s/p TAVR, COPD, chronic respiratory failure with hypoxia on 2 L O2 via [place] prn, CAD, HTN, HLD, PVD, normocytic anemia, chronic venous stasis dermatitis, s/p right TKA 02/07/2022, depression/anxiety who presented to the ED from home for evaluation of confusion. Patient was recently discharged to home where he lives alone from rehab facility.  He has had 24/7 home health nursing assistance at home.  His home health nurse noted that he was confused and having apparent hallucinations at nighttime therefore EMS were called and he was brought to the ED for further evaluation.  He reports recent issues with low blood pressure as well as intermittent episodes of cellulitis.  He has been having on and off pain with his right knee since his knee replacement on 8/21 but no significant changes recently.   He denies any subjective fevers, chills, diaphoresis, nausea, vomiting, chest pain, dyspnea, cough, abdominal pain, dysuria.  He reported recent issues with constipation but has been having regular bowel movements the last 4-5 days.  He states he uses 2 L supplemental O2 via [place] only as needed at home.  On admitting evaluation, patient is awake, alert, and fully oriented.  He does acknowledge feeling confused at nighttime and states that he was seen people who were not actually present in his home.  He recognized these people as his nurses from the rehab facility.    In the ED, BP 93/53, pulse 109, RR 16, temp 97.8 F, SPO2 96% on room air. While in the ED patient tachypneic with RR up to 25 and tachycardic with HR up to 133. Labs show sodium 136, potassium 3.7, bicarb 29, BUN 29, creatinine 1.24, serum glucose 84, AST 42,  ALT 25, alk phos 103, total bilirubin 1.3, ammonia 18, lactic acid 1.8, WBC 3.8, hemoglobin 9.9, platelets 172,000. Urinalysis negative for UTI.  Respiratory panel and single blood culture ordered and pending collection. Portable chest x-ray negative for focal consolidation, edema, effusion. Patient was given 2 L normal saline with additional 2.5 L LR ordered.  Patient was ordered to receive IV vancomycin, cefepime, Flagyl.  The hospitalist service was consulted to admit for further evaluation and management.  Assessment & Plan:   Shock, undifferentiated Patient presenting with hypotension.  Patient positive for COVID but no other infectious etiology elucidated.  Patient has history of chronic diastolic congestive heart failure with preserved LVEF.  Patient was adequately fluid resuscitated with 4.5 L of IV fluids followed by continuous infusion 150 mL/h.  Despite this, patient remained hypotensive and lethargic. --PCCM consulted --Transfer to ICU --Start peripheral phenylephrine  COVID-19 viral infection Presenting with tachycardia, tachypnea, and hypotension.  WBC 3.8.  No obvious infectious source.  Urinalysis negative for UTI.  CXR negative for pneumonia.  Has some swelling to right knee without erythema, surgical site appears well-healed.  Ultrasound right knee unrevealing.  COVID-19 PCR positive.  Influenza A/B PCR negative.   --molnupiravir 868m PO BID x 5 days -No steroids indicated at this time as chest x-ray clear and currently on baseline oxygen requirement of 2 L nasal cannula- -- Supportive care, antipyretics   Delirium: Resolved History sounds more like delirious episode at nighttime after recent discharge back to home from rehab, confounded by  acute COVID-19 viral infection.  He is fully oriented at time of admission. --Delirium precautions   Persistent atrial fibrillation ([place]) Remains in atrial fibrillation with intermittent tachycardia. --Hold atenolol --Continue  Eliquis --Continue monitor on telemetry  Chronic heart failure with preserved ejection fraction (HCC) Hypotensive and hypovolemic on admission.  Receiving IV fluid hydration.  Last EF 55-60% by TTE 07/14/2021. --Holding Lasix --Monitor strict I/O's and daily weights   Essential hypertension --Restart atenolol at reduced dose 25 mg p.o. daily (with holding parameters) now that blood pressure improved --Continue to monitor BP closely   Chronic respiratory failure with hypoxia (HCC) Uses 2 L O2 via [place] as needed at home.   --Continue supplemental oxygen as needed, goal SpO2 >/= 88%.   COPD (chronic obstructive pulmonary disease) (HCC) Stable, continue Dulera, albuterol and DuoNebs as needed.   Depression with anxiety Continue Effexor and doxepin.   Normocytic anemia Hemoglobin stable at 10.2   CAD (coronary artery disease) Continue atorvastatin and Eliquis.   HLD (hyperlipidemia) Continue atorvastatin  Weakness/deconditioning/debility: Currently resides at home with home health RN/PT.  Recently discharged from SNF. -- PT/OT evaluation   DVT prophylaxis:  apixaban (ELIQUIS) tablet 5 mg    Code Status: DNR Family Communication: No family present at bedside this morning  Disposition Plan:  Level of care: ICU Status is: Observation The patient remains OBS appropriate and will d/c before 2 midnights.    Consultants:  None  Procedures:  None  Antimicrobials:  Vancomycin 10/13 - 10/13 Cefepime 10/13 - 10/13 Metronidazole 10/13 - 10/13   Subjective: Patient seen examined bedside, resting calmly.  Lying in bed.  No specific complaints this morning  Objective: Vitals:   04/02/22 0827 04/02/22 0928 04/02/22 1248 04/02/22 1312  BP:  91/60 (!) 74/58 (!) 79/48  Pulse:  89 99 86  Resp:  18 20   Temp:  98.6 F (37 C) 98.1 F (36.7 C)   TempSrc:  Oral Oral   SpO2: 92% 94% 95%   Weight:      Height:        Intake/Output Summary (Last 24 hours) at 04/02/2022  1343 Last data filed at 04/02/2022 0700 Gross per 24 hour  Intake 1011.85 ml  Output 1200 ml  Net -188.15 ml   Filed Weights   04/01/22 1855 04/02/22 0124  Weight: 78.9 kg 76.9 kg    Examination:  Physical Exam: GEN: NAD, alert and oriented x 3, chronically ill appearance, lethargic but will respond to questions appropriately HEENT: NCAT, PERRL, EOMI, sclera clear, MMM PULM: CTAB w/o wheezes/crackles, normal respiratory effort, on 2 L nasal cannula CV: Tachycardic, irregularly irregular rhythm w/o M/G/R GI: abd soft, NTND, NABS, no R/G/M MSK: Right knee with mild edema, nontender to palpation, active/passive range of motion, chronic venous stasis dermatitis noted bilateral lower extremities anterior shins, trace bilateral lower extremity peripheral edema, moves all extremities independently NEURO: CN II-XII intact, no focal deficits, sensation to light touch intact PSYCH: normal mood/affect Integumentary: dry/intact, no rashes or wounds    Data Reviewed: I have personally reviewed following labs and imaging studies  CBC: Recent Labs  Lab 04/01/22 1940 04/02/22 0405  WBC 3.8* 6.7  NEUTROABS 2.4  --   HGB 9.9* 10.2*  HCT 31.7* 32.9*  MCV 86.4 88.2  PLT 172 364   Basic Metabolic Panel: Recent Labs  Lab 04/01/22 1940 04/02/22 0405  NA 136 139  K 3.7 3.6  CL 100 103  CO2 29 28  GLUCOSE 84 86  BUN 29*  25*  CREATININE 1.24 1.15  CALCIUM 8.1* 7.9*   GFR: Estimated Creatinine Clearance: 64.1 mL/min (by C-G formula based on SCr of 1.15 mg/dL). Liver Function Tests: Recent Labs  Lab 04/01/22 1940 04/02/22 0405  AST 42* 45*  ALT 25 27  ALKPHOS 103 101  BILITOT 1.3* 1.2  PROT 6.0* 5.8*  ALBUMIN 3.2* 2.9*   No results for input(s): "LIPASE", "AMYLASE" in the last 168 hours. Recent Labs  Lab 04/01/22 1940  AMMONIA 18   Coagulation Profile: No results for input(s): "INR", "PROTIME" in the last 168 hours. Cardiac Enzymes: No results for input(s):  "CKTOTAL", "CKMB", "CKMBINDEX", "TROPONINI" in the last 168 hours. BNP (last 3 results) No results for input(s): "PROBNP" in the last 8760 hours. HbA1C: No results for input(s): "HGBA1C" in the last 72 hours. CBG: Recent Labs  Lab 04/01/22 1926  GLUCAP 65*   Lipid Profile: No results for input(s): "CHOL", "HDL", "LDLCALC", "TRIG", "CHOLHDL", "LDLDIRECT" in the last 72 hours. Thyroid Function Tests: No results for input(s): "TSH", "T4TOTAL", "FREET4", "T3FREE", "THYROIDAB" in the last 72 hours. Anemia Panel: No results for input(s): "VITAMINB12", "FOLATE", "FERRITIN", "TIBC", "IRON", "RETICCTPCT" in the last 72 hours. Sepsis Labs: Recent Labs  Lab 04/01/22 1940 04/02/22 0405  PROCALCITON  --  <0.10  LATICACIDVEN 1.8  --     Recent Results (from the past 240 hour(s))  Resp Panel by RT-PCR (Flu A&B, Covid)     Status: Abnormal   Collection Time: 04/01/22 11:13 PM   Specimen: Nasal Swab  Result Value Ref Range Status   SARS Coronavirus 2 by RT PCR POSITIVE (A) NEGATIVE Final    Comment: (NOTE) SARS-CoV-2 target nucleic acids are DETECTED.  The SARS-CoV-2 RNA is generally detectable in upper respiratory specimens during the acute phase of infection. Positive results are indicative of the presence of the identified virus, but do not rule out bacterial infection or co-infection with other pathogens not detected by the test. Clinical correlation with patient history and other diagnostic information is necessary to determine patient infection status. The expected result is Negative.  Fact Sheet for Patients: EntrepreneurPulse.com.au  Fact Sheet for Healthcare Providers: IncredibleEmployment.be  This test is not yet approved or cleared by the [place] FDA and  has been authorized for detection and/or diagnosis of SARS-CoV-2 by FDA under an Emergency Use Authorization (EUA).  This EUA will remain in effect (meaning this test can be  used) for the duration of  the COVID-19 declaration under Section 564(b)(1) of the A ct, 21 U.S.C. section 360bbb-3(b)(1), unless the authorization is terminated or revoked sooner.     Influenza A by PCR NEGATIVE NEGATIVE Final   Influenza B by PCR NEGATIVE NEGATIVE Final    Comment: (NOTE) The Xpert Xpress SARS-CoV-2/FLU/RSV plus assay is intended as an aid in the diagnosis of influenza from Nasopharyngeal swab specimens and should not be used as a sole basis for treatment. Nasal washings and aspirates are unacceptable for Xpert Xpress SARS-CoV-2/FLU/RSV testing.  Fact Sheet for Patients: EntrepreneurPulse.com.au  Fact Sheet for Healthcare Providers: IncredibleEmployment.be  This test is not yet approved or cleared by the [place] FDA and has been authorized for detection and/or diagnosis of SARS-CoV-2 by FDA under an Emergency Use Authorization (EUA). This EUA will remain in effect (meaning this test can be used) for the duration of the COVID-19 declaration under Section 564(b)(1) of the Act, 21 U.S.C. section 360bbb-3(b)(1), unless the authorization is terminated or revoked.  Performed at [facility], [address]  [address] [place], [place] [address]          Radiology Studies: [place] LIMITED JOINT SPACE STRUCTURES LOW RIGHT  Result Date: 04/02/2022 CLINICAL DATA:  History of TKA 2 months ago, presenting with swelling of the right knee joint. EXAM: ULTRASOUND RIGHT LOWER EXTREMITY LIMITED TECHNIQUE: Ultrasound examination of the lower extremity soft tissues was performed in the area of clinical concern. COMPARISON:  None Available. FINDINGS: Joint Space: No effusion. Muscles: Normal. Tendons: Normal Other Soft Tissue Structures: Normal. IMPRESSION: Unremarkable RIGHT lower extremity ultrasound. Electronically Signed   By: Virgina Norfolk M.D.   On: 04/02/2022 00:42   DG Chest Portable 1 View  Result Date:  04/01/2022 CLINICAL DATA:  Altered mental status.  Confusion. EXAM: PORTABLE CHEST 1 VIEW COMPARISON:  Radiograph 02/23/2021 FINDINGS: Patient's chin partially obscures the right lung apex. Lung volumes are low. Mild chronic cardiomegaly. TAVR. No acute airspace disease, pulmonary edema, large pleural effusion or pneumothorax. IMPRESSION: Low lung volumes. Mild chronic cardiomegaly. Electronically Signed   By: Keith Rake M.D.   On: 04/01/2022 19:57        Scheduled Meds:  apixaban  5 mg Oral BID   atorvastatin  80 mg Oral QODAY   molnupiravir EUA  4 capsule Oral BID   mometasone-formoterol  2 puff Inhalation BID   sodium chloride flush  3 mL Intravenous Q12H   venlafaxine XR  75 mg Oral Q breakfast   Continuous Infusions:  sodium chloride     lactated ringers 75 mL/hr at 04/02/22 0704   phenylephrine (NEO-SYNEPHRINE) Adult infusion       LOS: 0 days    Time spent: 51 minutes spent on chart review, discussion with nursing staff, consultants, updating family and interview/physical exam; more than 50% of that time was spent in counseling and/or coordination of care.    Desarie Feild J [place] DO [facility] Available via Epic secure chat 7am-7pm After these hours, please refer to coverage provider listed on [email or web address] 04/02/2022, 1:43 PM

## 2022-04-02 NOTE — Assessment & Plan Note (Signed)
Continue Effexor and doxepin.

## 2022-04-02 NOTE — Assessment & Plan Note (Signed)
Presenting with tachycardia, tachypnea, and hypotension.  WBC 3.8.  No obvious infectious source.  Urinalysis negative for UTI.  CXR negative for pneumonia.  Has some swelling to right knee without erythema, surgical site appears well-healed. -S/p IV vancomycin, cefepime, Flagyl -Hold further antibiotics for now -Follow blood culture and COVID/flu panel -Ultrasound right knee -Receiving aggressive IV fluid hydration; monitor strict I/O's and daily weights

## 2022-04-02 NOTE — Assessment & Plan Note (Signed)
Continue atorvastatin

## 2022-04-02 NOTE — Assessment & Plan Note (Signed)
Hemoglobin stable at 9.9.

## 2022-04-02 NOTE — Assessment & Plan Note (Signed)
Uses 2 L O2 via [place] as needed at home.  Continue supplemental oxygen as needed.

## 2022-04-02 NOTE — Hospital Course (Signed)
Jordan [name] is a 71 y.o. 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

## 2022-04-02 NOTE — Assessment & Plan Note (Signed)
History sounds more like delirious episode at nighttime after recent discharge back to home from rehab.  He is fully oriented at time of admission. -Delirium precautions

## 2022-04-02 NOTE — ED Notes (Signed)
Ultrasound at bedside

## 2022-04-02 NOTE — Assessment & Plan Note (Signed)
Stable, continue Dulera, albuterol and DuoNebs as needed.

## 2022-04-02 NOTE — Progress Notes (Signed)
A consult was placed to the hospital's IV Nurse to start an ultrasound guided iv for vasopressors;  attempted x 2 in the left ant forearm, with the pt's nurse helping to hold his arm;  pt jumps and moves, veins constrict;  unable to place another iv;  currently has an iv in the RAFA, not ultrasound guided.

## 2022-04-02 NOTE — Progress Notes (Signed)
No need of bipap at this time. No resp distress noted. Pt is on 4L  and doing well at this time.

## 2022-04-02 NOTE — Assessment & Plan Note (Signed)
Continue atorvastatin and Eliquis.

## 2022-04-03 DIAGNOSIS — R6510 Systemic inflammatory response syndrome (SIRS) of non-infectious origin without acute organ dysfunction: Secondary | ICD-10-CM | POA: Diagnosis not present

## 2022-04-03 LAB — COMPREHENSIVE METABOLIC PANEL
ALT: 22 U/L (ref 0–44)
AST: 41 U/L (ref 15–41)
Albumin: 2.7 g/dL — ABNORMAL LOW (ref 3.5–5.0)
Alkaline Phosphatase: 80 U/L (ref 38–126)
Anion gap: 6 (ref 5–15)
BUN: 24 mg/dL — ABNORMAL HIGH (ref 8–23)
CO2: 27 mmol/L (ref 22–32)
Calcium: 7.9 mg/dL — ABNORMAL LOW (ref 8.9–10.3)
Chloride: 106 mmol/L (ref 98–111)
Creatinine, Ser: 1.1 mg/dL (ref 0.61–1.24)
GFR, Estimated: >60 mL/min (ref >60)
Glucose, Bld: 185 mg/dL — ABNORMAL HIGH (ref 70–99)
Potassium: 3 mmol/L — ABNORMAL LOW (ref 3.5–5.1)
Sodium: 139 mmol/L (ref 135–145)
Total Bilirubin: 1.3 mg/dL — ABNORMAL HIGH (ref 0.3–1.2)
Total Protein: 5.2 g/dL — ABNORMAL LOW (ref 6.5–8.1)

## 2022-04-03 LAB — CBC
HCT: 28.9 % — ABNORMAL LOW (ref 39.0–52.0)
Hemoglobin: 8.9 g/dL — ABNORMAL LOW (ref 13.0–17.0)
MCH: 26.9 pg (ref 26.0–34.0)
MCHC: 30.8 g/dL (ref 30.0–36.0)
MCV: 87.3 fL (ref 80.0–100.0)
Platelets: 182 10*3/uL (ref 150–400)
RBC: 3.31 MIL/uL — ABNORMAL LOW (ref 4.22–5.81)
RDW: 15.9 % — ABNORMAL HIGH (ref 11.5–15.5)
WBC: 1.7 10*3/uL — ABNORMAL LOW (ref 4.0–10.5)
nRBC: 0 % (ref 0.0–0.2)

## 2022-04-03 LAB — GLUCOSE, CAPILLARY: Glucose-Capillary: 133 mg/dL — ABNORMAL HIGH (ref 70–99)

## 2022-04-03 LAB — MAGNESIUM: Magnesium: 1.9 mg/dL (ref 1.7–2.4)

## 2022-04-03 MED ORDER — PANTOPRAZOLE SODIUM 40 MG PO TBEC
40.0000 mg | DELAYED_RELEASE_TABLET | Freq: Every day | ORAL | Status: DC
  Administered 2022-04-03 – 2022-04-11 (×9): 40 mg via ORAL
  Filled 2022-04-03 (×9): qty 1

## 2022-04-03 MED ORDER — POTASSIUM CHLORIDE CRYS ER 20 MEQ PO TBCR
30.0000 meq | EXTENDED_RELEASE_TABLET | ORAL | Status: AC
  Administered 2022-04-03 (×3): 30 meq via ORAL
  Filled 2022-04-03 (×3): qty 1

## 2022-04-03 MED ORDER — IPRATROPIUM-ALBUTEROL 0.5-2.5 (3) MG/3ML IN SOLN: 3.0000 mL | Freq: Four times a day (QID) | RESPIRATORY_TRACT | Status: DC | PRN

## 2022-04-03 MED ORDER — METHYLPREDNISOLONE SODIUM SUCC 125 MG IJ SOLR
125.0000 mg | Freq: Every day | INTRAMUSCULAR | Status: DC
  Administered 2022-04-03 – 2022-04-04 (×2): 125 mg via INTRAVENOUS
  Filled 2022-04-03 (×2): qty 2

## 2022-04-03 MED ORDER — MIDODRINE HCL 5 MG PO TABS
2.5000 mg | ORAL_TABLET | Freq: Three times a day (TID) | ORAL | Status: DC
  Administered 2022-04-03 – 2022-04-04 (×4): 2.5 mg via ORAL
  Filled 2022-04-03 (×6): qty 1

## 2022-04-03 MED ORDER — MIDODRINE HCL 5 MG PO TABS: 2.5000 mg | ORAL_TABLET | Freq: Three times a day (TID) | ORAL | Status: DC

## 2022-04-03 MED ORDER — APIXABAN 5 MG PO TABS
5.0000 mg | ORAL_TABLET | Freq: Two times a day (BID) | ORAL | Status: DC
  Administered 2022-04-03 – 2022-04-12 (×19): 5 mg via ORAL
  Filled 2022-04-03 (×19): qty 1

## 2022-04-03 MED ORDER — MOMETASONE FURO-FORMOTEROL FUM 200-5 MCG/ACT IN AERO
2.0000 | INHALATION_SPRAY | Freq: Two times a day (BID) | RESPIRATORY_TRACT | Status: DC
  Administered 2022-04-03 – 2022-04-12 (×19): 2 via RESPIRATORY_TRACT
  Filled 2022-04-03: qty 8.8

## 2022-04-03 MED ORDER — MAGNESIUM SULFATE 2 GM/50ML IV SOLN
2.0000 g | Freq: Once | INTRAVENOUS | Status: AC
  Administered 2022-04-03: 2 g via INTRAVENOUS
  Filled 2022-04-03: qty 50

## 2022-04-03 NOTE — Evaluation (Signed)
Occupational Therapy Evaluation Patient Details Name: Jordan [name] MRN: [id number] DOB: 04/19/1951 Today's Date: 04/03/2022   History of Present Illness Pt admitted from home 2* SOB and confusion with dx of COVID.  Pt with hx of CAsd, afib, CHF, COPD (on 2L O2 as needed at home, PVD, severe aortic stenosis, s/p TAVR, Bil LE edema, and R TKR 02/07/22   Clinical Impression   Patient is a 71 year old male who was admitted for above. Patient was +2 min A for transfers with TD for hygiene with increased fear of falling when attempting to remove one UE from RW. Patient was noted to have decreased functional activity tolerance, decreased endurance, decreased standing balance, decreased safety awareness, and decreased knowledge of AD/AE impacting participation in ADLs. Patient would continue to benefit from skilled OT services at this time while admitted and after d/c to address noted deficits in order to improve overall safety and independence in ADLs.        Recommendations for follow up therapy are one component of a multi-disciplinary discharge planning process, led by the attending physician.  Recommendations may be updated based on patient status, additional functional criteria and insurance authorization.   Follow Up Recommendations  Skilled nursing-short term rehab (<3 hours/day)    Assistance Recommended at Discharge Frequent or constant Supervision/Assistance  Patient can return home with the following A lot of help with walking and/or transfers;A lot of help with bathing/dressing/bathroom;Assistance with cooking/housework;Direct supervision/assist for financial management;Assist for transportation;Help with stairs or ramp for entrance;Direct supervision/assist for medications management    Functional Status Assessment  Patient has had a recent decline in their functional status and demonstrates the ability to make significant improvements in function in a reasonable and predictable  amount of time.  Equipment Recommendations  None recommended by OT    Recommendations for Other Services       Precautions / Restrictions Precautions Precautions: Knee Precaution Comments: recent R TKA so no pillow under knee while resting Restrictions Weight Bearing Restrictions: No Other Position/Activity Restrictions: WBAT      Mobility Bed Mobility Overal bed mobility: Needs Assistance Bed Mobility: Supine to Sit     Supine to sit: Min assist, Mod assist Sit to supine: Min assist   General bed mobility comments: Assist to bring trunk to upright and to complete rotation to EOB sitting with bed pad    Transfers                          Balance Overall balance assessment: Needs assistance Sitting-balance support: No upper extremity supported, Feet supported Sitting balance-Leahy Scale: Fair     Standing balance support: Bilateral upper extremity supported Standing balance-Leahy Scale: Poor                             ADL either performed or assessed with clinical judgement   ADL Overall ADL's : Needs assistance/impaired Eating/Feeding: Set up;Sitting   Grooming: Set up;Sitting Grooming Details (indicate cue type and reason): tremor like movements in BUE noted with all intentional movements. Upper Body Bathing: Minimal assistance;Sitting   Lower Body Bathing: Maximal assistance;Sitting/lateral leans;Sit to/from stand   Upper Body Dressing : Minimal assistance;Sitting   Lower Body Dressing: Maximal assistance;Sit to/from stand;Sitting/lateral leans Lower Body Dressing Details (indicate cue type and reason): patient needed BUE support standing with RW. Toilet Transfer: Minimal assistance;+2 for safety/equipment;+2 for physical assistance   Toileting- Clothing Manipulation and  Hygiene: Sit to/from stand;Maximal assistance Toileting - Clothing Manipulation Details (indicate cue type and reason): patietn noted to have increased fears of  falling with need for BUE support on RW. noted to have incontinence episode.             Vision         Perception     Praxis      Pertinent Vitals/Pain Pain Assessment Pain Assessment: No/denies pain     Hand Dominance Right   Extremity/Trunk Assessment Upper Extremity Assessment Upper Extremity Assessment: Overall WFL for tasks assessed   Lower Extremity Assessment Lower Extremity Assessment: Defer to PT evaluation   Cervical / Trunk Assessment Cervical / Trunk Assessment: Kyphotic   Communication Communication Communication: No difficulties   Cognition Arousal/Alertness: Awake/alert Behavior During Therapy: WFL for tasks assessed/performed, Anxious Overall Cognitive Status: Within Functional Limits for tasks assessed                                       General Comments       Exercises     Shoulder Instructions      Home Living Family/patient expects to be discharged to:: Private residence Living Arrangements: Alone Available Help at Discharge: Personal care attendant;Available 24 hours/day Type of Home: House Home Access: Ramped entrance     Home Layout: One level     Bathroom Shower/Tub: Walk-in shower         Home Equipment: Conservation officer, nature (2 wheels);Cane - single point;Shower seat - built in;BSC/3in1   Additional Comments: one step down into living room with rail.      Prior Functioning/Environment Prior Level of Function : Independent/Modified Independent             Mobility Comments: Pt states he is home alone and has been using RW since DC from SNF and HHPT and HHOT was helping him impove with more activity tolerance ADLs Comments: CNA, daily for ~3.5hours in the evening. Grocery shopping, cooking,        OT Problem List: Decreased activity tolerance;Impaired balance (sitting and/or standing);Decreased safety awareness;Cardiopulmonary status limiting activity;Decreased knowledge of precautions;Decreased  knowledge of use of DME or AE      OT Treatment/Interventions: Self-care/ADL training;Therapeutic exercise;Neuromuscular education;Energy conservation;DME and/or AE instruction;Therapeutic activities;Balance training;Patient/family education    OT Goals(Current goals can be found in the care plan section) Acute Rehab OT Goals Patient Stated Goal: to get out of bed OT Goal Formulation: With patient Time For Goal Achievement: 04/17/22 Potential to Achieve Goals: Fair  OT Frequency: Min 2X/week    Co-evaluation              AM-PAC OT "6 Clicks" Daily Activity     Outcome Measure Help from another person eating meals?: A Little Help from another person taking care of personal grooming?: A Little Help from another person toileting, which includes using toliet, bedpan, or urinal?: A Lot Help from another person bathing (including washing, rinsing, drying)?: A Lot Help from another person to put on and taking off regular upper body clothing?: A Little Help from another person to put on and taking off regular lower body clothing?: A Lot 6 Click Score: 15   End of Session Equipment Utilized During Treatment: Gait belt;Rolling walker (2 wheels);Oxygen Nurse Communication: Mobility status  Activity Tolerance: Patient tolerated treatment well Patient left: in chair;with call bell/phone within reach;with chair alarm set  OT Visit Diagnosis:  Unsteadiness on feet (R26.81);Other abnormalities of gait and mobility (R26.89);Repeated falls (R29.6);Muscle weakness (generalized) (M62.81)                Time: 1420-1500 OT Time Calculation (min): 40 min Charges:  OT General Charges $OT Visit: 1 Visit OT Evaluation $OT Eval Moderate Complexity: 1 Mod  Julian Askin OTR/L, MS Acute Rehabilitation Department Office# [phone]   Marcellina Millin 04/03/2022, 4:39 PM

## 2022-04-03 NOTE — Plan of Care (Signed)
04/03/2022  Problem: Education: Goal: Knowledge of General Education information will improve Description: Including pain rating scale, medication(s)/side effects and non-pharmacologic comfort measures Outcome: Progressing   Problem: Health Behavior/Discharge Planning: Goal: Ability to manage health-related needs will improve Outcome: Progressing   Problem: Clinical Measurements: Goal: Ability to maintain clinical measurements within normal limits will improve Outcome: Progressing Goal: Will remain free from infection Outcome: Progressing Goal: Diagnostic test results will improve Outcome: Progressing Goal: Respiratory complications will improve Outcome: Progressing Goal: Cardiovascular complication will be avoided Outcome: Progressing   Problem: Activity: Goal: Risk for activity intolerance will decrease Outcome: Progressing   Problem: Nutrition: Goal: Adequate nutrition will be maintained Outcome: Progressing   Problem: Coping: Goal: Level of anxiety will decrease Outcome: Progressing   Problem: Elimination: Goal: Will not experience complications related to bowel motility Outcome: Progressing Goal: Will not experience complications related to urinary retention Outcome: Progressing   Problem: Pain Managment: Goal: General experience of comfort will improve Outcome: Progressing   Problem: Safety: Goal: Ability to remain free from injury will improve Outcome: Progressing   Problem: Skin Integrity: Goal: Risk for impaired skin integrity will decrease Outcome: Progressing   Cindy S. Brigitte Pulse BSN, RN, [place] 04/03/2022 5:28 AM

## 2022-04-03 NOTE — Progress Notes (Signed)
Bipap not needed at this time. °

## 2022-04-03 NOTE — Progress Notes (Signed)
Per [facility] [place]   Patient off pressors and better. CCM can sign off    SIGNATURE    Dr. Brand Males, M.D., F.C.C.P,  Pulmonary and Critical Care Medicine Staff Physician, [place] Director - Interstitial Lung Disease  Program  Medical Director - [facility] [place] [facility] [place], [place], [address]   Pager: [phone], If no answer  -[place] or Try [phone] Telephone (clinical office): [phone] Telephone (research): [phone]  9:15 AM 04/03/2022

## 2022-04-03 NOTE — Evaluation (Signed)
Physical Therapy Evaluation Patient Details Name: Jordan [name] MRN: [id number] DOB: [birth year] Today's Date: 04/03/2022  History of Present Illness  Pt admitted from home 2* SOB and confusion with dx of COVID.  Pt with hx of CAsd, afib, CHF, COPD (on 2L O2 as needed at home, PVD, severe aortic stenosis, s/p TAVR, Bil LE edema, and R TKR 02/07/22  Clinical Impression  Pt admitted as above and presenting with functional mobility limitations 2* generalized weakness, poor endurance, and balance deficits.  This date, pt assisted from bed to standing and then to [facility] 2* stool incontinence, assisted with change of gown/socks and hygiene completed in standing before pt assisted to chair.  Pt very cooperative but with increased anxiety regarding falls and very unsteady on feet.  Pt hopes to progress to dc home and reports he can arrange 24/7 PCA to assist.  Pt would also benefit from follow up HHPT to further address deficits and maximize IND and safety at home.     Recommendations for follow up therapy are one component of a multi-disciplinary discharge planning process, led by the attending physician.  Recommendations may be updated based on patient status, additional functional criteria and insurance authorization.  Follow Up Recommendations Home health PT Can patient physically be transported by private vehicle: Yes    Assistance Recommended at Discharge Frequent or constant Supervision/Assistance  Patient can return home with the following  A little help with walking and/or transfers;A little help with bathing/dressing/bathroom;Assistance with cooking/housework;Assist for transportation;Help with stairs or ramp for entrance    Equipment Recommendations None recommended by PT  Recommendations for Other Services       Functional Status Assessment Patient has had a recent decline in their functional status and demonstrates the ability to make significant improvements in function in a reasonable  and predictable amount of time.     Precautions / Restrictions Precautions Precautions: Knee Precaution Comments: recent R TKA so no pillow under knee while resting Restrictions Weight Bearing Restrictions: No Other Position/Activity Restrictions: WBAT      Mobility  Bed Mobility Overal bed mobility: Needs Assistance Bed Mobility: Supine to Sit     Supine to sit: Min assist, Mod assist     General bed mobility comments: Assist to bring trunk to upright and to complete rotation to EOB sitting with bed pad    Transfers Overall transfer level: Needs assistance Equipment used: Rolling walker (2 wheels) Transfers: Sit to/from Stand, Bed to chair/wheelchair/BSC Sit to Stand: Min assist, +2 safety/equipment   Step pivot transfers: Min assist, +2 physical assistance, +2 safety/equipment       General transfer comment: Assist to bring wt up and fwd and balance in standing with RW; step pvt bed to [facility] and BSC to recliner with RW    Ambulation/Gait               General Gait Details: transfers to [facility] and recliner only - ltd by fatigue and bowel incontinence  Stairs            Wheelchair Mobility    Modified Rankin (Stroke Patients Only)       Balance Overall balance assessment: Needs assistance Sitting-balance support: No upper extremity supported, Feet supported Sitting balance-Leahy Scale: Fair     Standing balance support: Bilateral upper extremity supported Standing balance-Leahy Scale: Poor  Pertinent Vitals/Pain Pain Assessment Pain Assessment: No/denies pain    Home Living Family/patient expects to be discharged to:: Private residence Living Arrangements: Alone Available Help at Discharge: Personal care attendant;Available 24 hours/day Type of Home: House Home Access: Ramped entrance       Home Layout: One level Home Equipment: Conservation officer, nature (2 wheels);Cane - single point;Shower seat - built  in;BSC/3in1 Additional Comments: one step down into living room with rail.    Prior Function Prior Level of Function : Independent/Modified Independent             Mobility Comments: Pt states he is home alone and has been using RW since DC from SNF and HHPT and HHOT was helping him impove with more activity tolerance ADLs Comments: CNA, daily for ~3.5hours in the evening. Grocery shopping, cooking,     Hand Dominance   Dominant Hand: Right    Extremity/Trunk Assessment   Upper Extremity Assessment Upper Extremity Assessment: Defer to OT evaluation    Lower Extremity Assessment Lower Extremity Assessment: Generalized weakness    Cervical / Trunk Assessment Cervical / Trunk Assessment: Kyphotic  Communication   Communication: No difficulties  Cognition Arousal/Alertness: Awake/alert Behavior During Therapy: WFL for tasks assessed/performed, Anxious Overall Cognitive Status: Within Functional Limits for tasks assessed                                          General Comments      Exercises     Assessment/Plan    PT Assessment Patient needs continued PT services  PT Problem List Decreased strength;Decreased range of motion;Decreased activity tolerance;Decreased mobility;Decreased balance;Decreased knowledge of use of DME       PT Treatment Interventions DME instruction;Gait training;Functional mobility training;Therapeutic activities;Therapeutic exercise;Balance training;Patient/family education    PT Goals (Current goals can be found in the Care Plan section)  Acute Rehab PT Goals Patient Stated Goal: Return home with 24/7 PCA PT Goal Formulation: With patient Time For Goal Achievement: 04/17/22 Potential to Achieve Goals: Fair    Frequency Min 3X/week     Co-evaluation               AM-PAC PT "6 Clicks" Mobility  Outcome Measure Help needed turning from your back to your side while in a flat bed without using bedrails?: A  Little Help needed moving from lying on your back to sitting on the side of a flat bed without using bedrails?: A Lot Help needed moving to and from a bed to a chair (including a wheelchair)?: A Lot Help needed standing up from a chair using your arms (e.g., wheelchair or bedside chair)?: A Lot Help needed to walk in hospital room?: Total Help needed climbing 3-5 steps with a railing? : Total 6 Click Score: 11    End of Session Equipment Utilized During Treatment: Gait belt Activity Tolerance: Patient limited by fatigue;Patient tolerated treatment well Patient left: in chair;with call bell/phone within reach;with chair alarm set Nurse Communication: Mobility status PT Visit Diagnosis: Muscle weakness (generalized) (M62.81);Difficulty in walking, not elsewhere classified (R26.2)    Time: 1421-1500 PT Time Calculation (min) (ACUTE ONLY): 39 min   Charges:   PT Evaluation $PT Eval Low Complexity: 1 Low PT Treatments $Therapeutic Activity: 8-22 mins        Debe Coder PT Acute Rehabilitation Services Pager [phone] Office [phone]   Evalee Gerard 04/03/2022, 4:09 PM

## 2022-04-03 NOTE — Progress Notes (Addendum)
PROGRESS NOTE    Jordan [name]  KLK:[id number] DOB: 1951/03/25 DOA: 04/01/2022 PCP: [name], MD    Brief Narrative:   Jordan [name] is a 71 y.o. 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 Patient was recently discharged to home where he lives alone from rehab facility.  He has had 24/7 home health nursing assistance at home.  His home health nurse noted that he was confused and having apparent hallucinations at nighttime therefore EMS were called and he was brought to the ED for further evaluation.  He reports recent issues with low blood pressure as well as intermittent episodes of cellulitis.  He has been having on and off pain with his right knee since his knee replacement on 8/21 but no significant changes recently.   He denies any subjective fevers, chills, diaphoresis, nausea, vomiting, chest pain, dyspnea, cough, abdominal pain, dysuria.  He reported recent issues with constipation but has been having regular bowel movements the last 4-5 days.  He states he uses 2 L supplemental O2 via [place] only as needed at home.  On admitting evaluation, patient is awake, alert, and fully oriented.  He does acknowledge feeling confused at nighttime and states that he was seen people who were not actually present in his home.  He recognized these people as his nurses from the rehab facility.    In the ED, BP 93/53, pulse 109, RR 16, temp 97.8 F, SPO2 96% on room air. While in the ED patient tachypneic with RR up to 25 and tachycardic with HR up to 133. Labs show sodium 136, potassium 3.7, bicarb 29, BUN 29, creatinine 1.24, serum glucose 84, AST 42,  ALT 25, alk phos 103, total bilirubin 1.3, ammonia 18, lactic acid 1.8, WBC 3.8, hemoglobin 9.9, platelets 172,000. Urinalysis negative for UTI.  Respiratory panel and single blood culture ordered and pending collection. Portable chest x-ray negative for focal consolidation, edema, effusion. Patient was given 2 L normal saline with additional 2.5 L LR ordered.  Patient was ordered to receive IV vancomycin, cefepime, Flagyl.  The hospitalist service was consulted to admit for further evaluation and management.  Assessment & Plan:   Shock, undifferentiated Patient presenting with hypotension.  Patient positive for COVID but no other infectious etiology elucidated.  Patient has history of chronic diastolic congestive heart failure with preserved LVEF.  Patient was adequately fluid resuscitated with 4.5 L of IV fluids followed by continuous infusion 150 mL/h.  Despite this, patient remained hypotensive and lethargic.  PCCM was consulted and patient was transferred to the intensive care unit on peripheral phenylephrine drip.  Patient was given additional IV fluid bolus, started on IV steroids with improvement of his blood pressure.  Phenylephrine was closely titrated off.  Patient now less lethargic and alert.  Unclear etiology, possibly due to COVID infection, severe dehydration versus medication side effect with anxiolytics he was taking outpatient. -- midodrine 2.5 mg p.o. 3 times daily; Hold fro SBP >125 -- continue to monitor BP closely  COVID-19 viral infection Presenting with tachycardia, tachypnea, and hypotension.  WBC 3.8.  No obvious infectious source.  Urinalysis negative for UTI.  CXR negative for pneumonia.  Has some swelling to  right knee without erythema, surgical site appears well-healed.  Ultrasound right knee unrevealing.  COVID-19 PCR positive.  Influenza A/B PCR negative.   -- molnupiravir 811m PO BID x 5 days -- Titrate down Solu-Medrol to 125 mg IV q24h -- Supportive care,  antipyretics  Hypokalemia Potassium 3.0, will replete. --Repeat electrolytes in a.m.   Delirium: Resolved History sounds more like delirious episode at nighttime after recent discharge back to home from rehab, confounded by acute COVID-19 viral infection.  He is fully oriented at time of admission. -- Delirium precautions   Persistent atrial fibrillation ([place] Remains in atrial fibrillation with intermittent tachycardia. -- Hold atenolol -- Continue Eliquis 556mPO BID -- Continue monitor on telemetry  Chronic heart failure with preserved ejection fraction (HCC) Hypotensive and hypovolemic on admission.  Receiving IV fluid hydration.  Last EF 55-60% by TTE 07/14/2021. --Holding Lasix --Monitor strict I/O's and daily weights   Essential hypertension --Holding atenolol as above --Continue to monitor BP closely   Chronic respiratory failure with hypoxia (HCC) Uses 2 L O2 via [place] as needed at home.   --Continue supplemental oxygen as needed, goal SpO2 >/= 88%.   COPD (chronic obstructive pulmonary disease) (HCC) Stable, continue Dulera, albuterol and DuoNebs as needed.   Depression with anxiety Continue Effexor and doxepin.   Normocytic anemia Hemoglobin stable at 10.2   CAD (coronary artery disease) Continue atorvastatin and Eliquis.   HLD (hyperlipidemia) Continue atorvastatin  Weakness/deconditioning/debility: Currently resides at home with home health RN/PT.  Recently discharged from SNF. -- PT/OT evaluation   DVT prophylaxis: Eliquis    Code Status: DNR Family Communication: No family present at bedside this morning, updated [name] via telephone this morning  Disposition Plan:  Level of care: ICU Status is: Inpatient Remains inpatient appropriate because: Titrating off of vasopressors, remains on IV steroids, pending PT/OT evaluation and further medical stability   Consultants:  PCCM  Procedures:  None  Antimicrobials:  Vancomycin 10/13 -  10/13 Cefepime 10/13 - 10/13 Metronidazole 10/13 - 10/13   Subjective: Patient seen examined bedside, resting calmly.  Lying in bed.  Eating breakfast.  Much more alert and less lethargic today.  RN present at bedside.  Titrated off of phenylephrine drip this morning.  Blood pressures appear to have stabilized.  Discussed with PCCM.  No specific complaints this morning.  Denies headache, no visual changes, no chest pain, no palpitation, no shortness of breath, no abdominal pain, no fever/chills, no nausea/vomiting/diarrhea, no focal weakness.  No acute events overnight per nursing staff.  Objective: Vitals:   04/03/22 0730 04/03/22 0800 04/03/22 0806 04/03/22 0900  BP: 131/80 115/73  (!) 110/58  Pulse: 95 96  75  Resp: 17 (!) 23  (!) 21  Temp:   98.2 F (36.8 C)   TempSrc:   Axillary   SpO2: 98% 97%  99%  Weight:      Height:        Intake/Output Summary (Last 24 hours) at 04/03/2022 0957 Last data filed at 04/03/2022 0843 Gross per 24 hour  Intake 2679.37 ml  Output 500 ml  Net 2179.37 ml   Filed Weights   04/02/22 1355 04/02/22 1400 04/03/22 0403  Weight: 73.6 kg 73.6 kg 73.3 kg    Examination:  Physical Exam: GEN: NAD, alert and oriented x 3, chronically ill appearance HEENT: NCAT, PERRL, EOMI, sclera clear, MMM PULM: CTAB w/o wheezes/crackles, normal respiratory effort, on 2 L nasal cannula CV: Tachycardic, irregularly irregular rhythm w/o M/G/R GI: abd soft, NTND, NABS,  no R/G/M MSK: Right knee with mild edema, nontender to palpation, active/passive range of motion, chronic venous stasis dermatitis noted bilateral lower extremities anterior shins, trace bilateral lower extremity peripheral edema, moves all extremities independently NEURO: CN II-XII intact, no focal deficits, sensation to light touch intact PSYCH: normal mood/affect Integumentary: dry/intact, no rashes or wounds    Data Reviewed: I have personally reviewed following labs and imaging  studies  CBC: Recent Labs  Lab 04/01/22 1940 04/02/22 0405 04/02/22 1558 04/03/22 0523  WBC 3.8* 6.7 6.8 1.7*  NEUTROABS 2.4  --   --   --   HGB 9.9* 10.2* 9.8* 8.9*  HCT 31.7* 32.9* 31.7* 28.9*  MCV 86.4 88.2 87.6 87.3  PLT 172 161 198 711   Basic Metabolic Panel: Recent Labs  Lab 04/01/22 1940 04/02/22 0405 04/03/22 0523  NA 136 139 139  K 3.7 3.6 3.0*  CL 100 103 106  CO2 29 28 27   GLUCOSE 84 86 185*  BUN 29* 25* 24*  CREATININE 1.24 1.15 1.10  CALCIUM 8.1* 7.9* 7.9*  MG  --   --  1.9   GFR: Estimated Creatinine Clearance: 63.9 mL/min (by C-G formula based on SCr of 1.1 mg/dL). Liver Function Tests: Recent Labs  Lab 04/01/22 1940 04/02/22 0405 04/03/22 0523  AST 42* 45* 41  ALT 25 27 22   ALKPHOS 103 101 80  BILITOT 1.3* 1.2 1.3*  PROT 6.0* 5.8* 5.2*  ALBUMIN 3.2* 2.9* 2.7*   No results for input(s): "LIPASE", "AMYLASE" in the last 168 hours. Recent Labs  Lab 04/01/22 1940  AMMONIA 18   Coagulation Profile: No results for input(s): "INR", "PROTIME" in the last 168 hours. Cardiac Enzymes: Recent Labs  Lab 04/02/22 1558  CKTOTAL 122  CKMB 2.3   BNP (last 3 results) No results for input(s): "PROBNP" in the last 8760 hours. HbA1C: No results for input(s): "HGBA1C" in the last 72 hours. CBG: Recent Labs  Lab 04/01/22 1926 04/02/22 1427 04/03/22 0018  GLUCAP 65* 82 133*   Lipid Profile: No results for input(s): "CHOL", "HDL", "LDLCALC", "TRIG", "CHOLHDL", "LDLDIRECT" in the last 72 hours. Thyroid Function Tests: No results for input(s): "TSH", "T4TOTAL", "FREET4", "T3FREE", "THYROIDAB" in the last 72 hours. Anemia Panel: No results for input(s): "VITAMINB12", "FOLATE", "FERRITIN", "TIBC", "IRON", "RETICCTPCT" in the last 72 hours. Sepsis Labs: Recent Labs  Lab 04/01/22 1940 04/02/22 0405 04/02/22 1558  PROCALCITON  --  <0.10 <0.10  LATICACIDVEN 1.8  --  1.1    Recent Results (from the past 240 hour(s))  Resp Panel by RT-PCR (Flu  A&B, Covid)     Status: Abnormal   Collection Time: 04/01/22 11:13 PM   Specimen: Nasal Swab  Result Value Ref Range Status   SARS Coronavirus 2 by RT PCR POSITIVE (A) NEGATIVE Final    Comment: (NOTE) SARS-CoV-2 target nucleic acids are DETECTED.  The SARS-CoV-2 RNA is generally detectable in upper respiratory specimens during the acute phase of infection. Positive results are indicative of the presence of the identified virus, but do not rule out bacterial infection or co-infection with other pathogens not detected by the test. Clinical correlation with patient history and other diagnostic information is necessary to determine patient infection status. The expected result is Negative.  Fact Sheet for Patients: EntrepreneurPulse.com.au  Fact Sheet for Healthcare Providers: IncredibleEmployment.be  This test is not yet approved or cleared by the [place] FDA and  has been authorized for detection and/or diagnosis of SARS-CoV-2 by FDA under an Emergency Use  Authorization (EUA).  This EUA will remain in effect (meaning this test can be used) for the duration of  the COVID-19 declaration under Section 564(b)(1) of the A ct, 21 U.S.C. section 360bbb-3(b)(1), unless the authorization is terminated or revoked sooner.     Influenza A by PCR NEGATIVE NEGATIVE Final   Influenza B by PCR NEGATIVE NEGATIVE Final    Comment: (NOTE) The Xpert Xpress SARS-CoV-2/FLU/RSV plus assay is intended as an aid in the diagnosis of influenza from Nasopharyngeal swab specimens and should not be used as a sole basis for treatment. Nasal washings and aspirates are unacceptable for Xpert Xpress SARS-CoV-2/FLU/RSV testing.  Fact Sheet for Patients: EntrepreneurPulse.com.au  Fact Sheet for Healthcare Providers: IncredibleEmployment.be  This test is not yet approved or cleared by the [place] FDA and has been authorized  for detection and/or diagnosis of SARS-CoV-2 by FDA under an Emergency Use Authorization (EUA). This EUA will remain in effect (meaning this test can be used) for the duration of the COVID-19 declaration under Section 564(b)(1) of the Act, 21 U.S.C. section 360bbb-3(b)(1), unless the authorization is terminated or revoked.  Performed at [facility], [place] [address], [place], [place] [address]   MRSA Next Gen by PCR, Nasal     Status: None   Collection Time: 04/02/22  1:57 PM   Specimen: Nasal Mucosa; Nasal Swab  Result Value Ref Range Status   MRSA by PCR Next Gen NOT DETECTED NOT DETECTED Final    Comment: (NOTE) The GeneXpert MRSA Assay (FDA approved for NASAL specimens only), is one component of a comprehensive MRSA colonization surveillance program. It is not intended to diagnose MRSA infection nor to guide or monitor treatment for MRSA infections. Test performance is not FDA approved in patients less than 5 years old. Performed at [facility], [place] [address], [place], [place] [address]          Radiology Studies: DG CHEST PORT 1 VIEW  Result Date: 04/02/2022 CLINICAL DATA:  PICC line placement EXAM: PORTABLE CHEST 1 VIEW COMPARISON:  04/02/2022 FINDINGS: Left PICC line is in place with the tip in the right atrium approximately 3 cm deep to the cavoatrial junction. Heart is normal size. No confluent opacities or effusions. No acute bony abnormality. IMPRESSION: Left PICC line tip in the upper right atrium 3 cm deep to the cavoatrial junction. Electronically Signed   By: Rolm Baptise M.D.   On: 04/02/2022 19:06   DG CHEST PORT 1 VIEW  Result Date: 04/02/2022 CLINICAL DATA:  Coronavirus infection.  Tachycardia. EXAM: PORTABLE CHEST 1 VIEW COMPARISON:  04/01/2022 FINDINGS: Heart size upper limits of normal. Aortic atherosclerotic calcification as seen previously. The lungs are clear. No infiltrate, collapse or effusion. IMPRESSION: No  active disease. Aortic atherosclerotic calcification. Electronically Signed   By: Nelson Chimes M.D.   On: 04/02/2022 16:59   [place] EKG SITE RITE  Result Date: 04/02/2022 If Site Rite image not attached, placement could not be confirmed due to current cardiac rhythm.  [place] LIMITED JOINT SPACE STRUCTURES LOW RIGHT  Result Date: 04/02/2022 CLINICAL DATA:  History of TKA 2 months ago, presenting with swelling of the right knee joint. EXAM: ULTRASOUND RIGHT LOWER EXTREMITY LIMITED TECHNIQUE: Ultrasound examination of the lower extremity soft tissues was performed in the area of clinical concern. COMPARISON:  None Available. FINDINGS: Joint Space: No effusion. Muscles: Normal. Tendons: Normal Other Soft Tissue Structures: Normal. IMPRESSION: Unremarkable RIGHT lower extremity ultrasound. Electronically Signed   By: Virgina Norfolk  M.D.   On: 04/02/2022 00:42   DG Chest Portable 1 View  Result Date: 04/01/2022 CLINICAL DATA:  Altered mental status.  Confusion. EXAM: PORTABLE CHEST 1 VIEW COMPARISON:  Radiograph 02/23/2021 FINDINGS: Patient's chin partially obscures the right lung apex. Lung volumes are low. Mild chronic cardiomegaly. TAVR. No acute airspace disease, pulmonary edema, large pleural effusion or pneumothorax. IMPRESSION: Low lung volumes. Mild chronic cardiomegaly. Electronically Signed   By: Keith Rake M.D.   On: 04/01/2022 19:57        Scheduled Meds:  atorvastatin  80 mg Oral QODAY   Chlorhexidine Gluconate Cloth  6 each Topical Daily   ipratropium-albuterol  3 mL Nebulization TID   methylPREDNISolone (SOLU-MEDROL) injection  125 mg Intravenous Daily   midodrine  2.5 mg Oral Q8H   molnupiravir EUA  4 capsule Oral BID   pantoprazole (PROTONIX) IV  40 mg Intravenous QHS   potassium chloride  30 mEq Oral Q3H   sodium chloride flush  3 mL Intravenous Q12H   venlafaxine XR  75 mg Oral Q breakfast   Continuous Infusions:  sodium chloride Stopped (04/03/22 0812)    phenylephrine (NEO-SYNEPHRINE) Adult infusion Stopped (04/03/22 0527)     LOS: 1 day    Time spent: 54 minutes spent on chart review, discussion with nursing staff, consultants, updating family and interview/physical exam; more than 50% of that time was spent in counseling and/or coordination of care.    Chivonne Rascon J [place] DO [facility] Available via Epic secure chat 7am-7pm After these hours, please refer to coverage provider listed on [email or web address] 04/03/2022, 9:57 AM

## 2022-04-04 DIAGNOSIS — I4891 Unspecified atrial fibrillation: Secondary | ICD-10-CM

## 2022-04-04 DIAGNOSIS — R6510 Systemic inflammatory response syndrome (SIRS) of non-infectious origin without acute organ dysfunction: Secondary | ICD-10-CM

## 2022-04-04 DIAGNOSIS — I5089 Other heart failure: Secondary | ICD-10-CM

## 2022-04-04 LAB — BASIC METABOLIC PANEL
Anion gap: 4 — ABNORMAL LOW (ref 5–15)
BUN: 32 mg/dL — ABNORMAL HIGH (ref 8–23)
CO2: 24 mmol/L (ref 22–32)
Calcium: 7.7 mg/dL — ABNORMAL LOW (ref 8.9–10.3)
Chloride: 105 mmol/L (ref 98–111)
Creatinine, Ser: 1.37 mg/dL — ABNORMAL HIGH (ref 0.61–1.24)
GFR, Estimated: 55 mL/min — ABNORMAL LOW (ref >60)
Glucose, Bld: 162 mg/dL — ABNORMAL HIGH (ref 70–99)
Potassium: 4.4 mmol/L (ref 3.5–5.1)
Sodium: 133 mmol/L — ABNORMAL LOW (ref 135–145)

## 2022-04-04 LAB — MAGNESIUM: Magnesium: 2.2 mg/dL (ref 1.7–2.4)

## 2022-04-04 MED ORDER — ALUM & MAG HYDROXIDE-SIMETH 200-200-20 MG/5ML PO SUSP
30.0000 mL | ORAL | Status: DC | PRN
  Administered 2022-04-04 – 2022-04-05 (×2): 30 mL via ORAL
  Filled 2022-04-04 (×2): qty 30

## 2022-04-04 MED ORDER — SODIUM CHLORIDE 0.9 % IV SOLN: INTRAVENOUS | Status: AC

## 2022-04-04 MED ORDER — METHYLPREDNISOLONE SODIUM SUCC 125 MG IJ SOLR
80.0000 mg | Freq: Every day | INTRAMUSCULAR | Status: DC
  Administered 2022-04-05: 80 mg via INTRAVENOUS
  Filled 2022-04-04: qty 2

## 2022-04-04 MED ORDER — GUAIFENESIN ER 600 MG PO TB12
600.0000 mg | ORAL_TABLET | Freq: Two times a day (BID) | ORAL | Status: DC
  Administered 2022-04-04 – 2022-04-12 (×17): 600 mg via ORAL
  Filled 2022-04-04 (×17): qty 1

## 2022-04-04 MED ORDER — ATENOLOL 25 MG PO TABS
12.5000 mg | ORAL_TABLET | Freq: Every day | ORAL | Status: DC
  Administered 2022-04-04: 12.5 mg via ORAL
  Filled 2022-04-04 (×2): qty 1

## 2022-04-04 MED ORDER — SODIUM CHLORIDE 0.9% FLUSH: 10.0000 mL | INTRAVENOUS | Status: DC | PRN

## 2022-04-04 NOTE — NC FL2 (Signed)
[place] LEVEL OF CARE SCREENING TOOL     IDENTIFICATION  Patient Name: Jordan [name] Birthdate: 07-24-1950 Sex: male Admission Date (Current Location): 04/01/2022  [facility] and [place] Number:  Herbalist and Address:  [facility],  [place], [place]      Provider Number: [id number]  Attending Physician Name and Address:  [place] Eric J, DO  Relative Name and Phone Number:  [place], [place] [phone]    Current Level of Care: Hospital Recommended Level of Care: [place] Prior Approval Number:    Date Approved/Denied:   PASRR Number: [id number]  Discharge Plan: SNF    Current Diagnoses: Patient Active Problem List   Diagnosis Date Noted   SIRS (systemic inflammatory response syndrome) ([place]) 04/01/2022   Delirium 04/01/2022   Normocytic anemia 04/01/2022   Depression with anxiety 04/01/2022   OA (osteoarthritis) of knee 02/07/2022   Osteoarthritis of right knee 02/07/2022   Preoperative cardiovascular examination 11/03/2021   Sinus bradycardia 11/03/2021   Stage 3 chronic kidney disease ([place]) 08/30/2021   Aortic atherosclerosis ([place]) 08/30/2021   Chronic respiratory failure with hypoxia ([place]) 02/23/2021   Anxiety    Persistent atrial fibrillation ([place]) 01/03/2021   Open wound of left foot 12/30/2020   Macrocytic anemia 12/30/2020   Arthritis 12/30/2020   Chronic heart failure with preserved ejection fraction ([place]) 12/30/2020   Left leg cellulitis 12/29/2020   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   Diabetes mellitus without complication (HCC)    COPD (chronic obstructive pulmonary disease) ([place])    CAD (coronary artery disease)    Severe aortic stenosis s/p TAVR in Jan 2022    GERD (gastroesophageal reflux disease)    Protein-calorie malnutrition, severe 03/05/2020   Tobacco abuse 03/03/2020   Pressure injury of skin 03/15/2017   Chronic venous  insufficiency 04/28/2014   PAD (peripheral artery disease) ([place]) 01/21/2014   Varicose veins of lower extremities with other complications 17/61/6073   Hx of TIA (transient ischemic attack) and stroke 12/31/2013   HLD (hyperlipidemia) 12/31/2013   Severe obesity (BMI >= 40) ([place]) 12/31/2013   Essential hypertension 09/13/2012   Edema 09/13/2012    Orientation RESPIRATION BLADDER Height & Weight     Self, Time, Situation, Place  O2 Incontinent, External catheter Weight: 167 lb 8.8 oz (76 kg) Height:  '6\' 1"'$  (185.4 cm)  BEHAVIORAL SYMPTOMS/MOOD NEUROLOGICAL BOWEL NUTRITION STATUS      Continent Diet (Regular)  AMBULATORY STATUS COMMUNICATION OF NEEDS Skin   Limited Assist Verbally Normal                       Personal Care Assistance Level of Assistance  Bathing, Feeding, Dressing Bathing Assistance: Maximum assistance Feeding assistance: Limited assistance Dressing Assistance: Maximum assistance     Functional Limitations Info  Sight, Hearing, Speech Sight Info: Adequate Hearing Info: Adequate Speech Info: Adequate    SPECIAL CARE FACTORS FREQUENCY  PT (By licensed PT), OT (By licensed OT)     PT Frequency: 5x/wk OT Frequency: 5x/wk            Contractures Contractures Info: Not present    Additional Factors Info  Code Status, Allergies Code Status Info: DNR Allergies Info: Altace (Ramipril), Codeine, Metoprolol Tartrate, Naproxen           Current Medications (04/04/2022):  This is the current hospital active medication list Current Facility-Administered Medications  Medication Dose Route Frequency Provider Last Rate Last  Admin   0.9 %  sodium chloride infusion   Intravenous PRN [place] Eric J, DO   Stopped at 04/03/22 1602   0.9 %  sodium chloride infusion   Intravenous Continuous [place] Eric J, DO 75 mL/hr at 04/04/22 0813 New Bag at 04/04/22 0813   acetaminophen (TYLENOL) tablet 650 mg  650 mg Oral Q6H PRN Lenore Cordia, MD       Or    acetaminophen (TYLENOL) suppository 650 mg  650 mg Rectal Q6H PRN Lenore Cordia, MD       alum & mag hydroxide-simeth (MAALOX/MYLANTA) 200-200-20 MG/5ML suspension 30 mL  30 mL Oral Q4H PRN [place] Eric J, DO   30 mL at 04/04/22 0544   apixaban (ELIQUIS) tablet 5 mg  5 mg Oral BID [place] Eric J, DO   5 mg at 04/04/22 0802   atenolol (TENORMIN) tablet 12.5 mg  12.5 mg Oral Daily [place] Eric J, DO   12.5 mg at 04/04/22 0802   atorvastatin (LIPITOR) tablet 80 mg  80 mg Oral De Nurse, MD   80 mg at 04/04/22 4431   Chlorhexidine Gluconate Cloth 2 % PADS 6 each  6 each Topical Daily [place] Eric J, DO   6 each at 04/04/22 0805   doxepin (SINEQUAN) capsule 50 mg  50 mg Oral QHS PRN Lenore Cordia, MD       guaiFENesin (MUCINEX) 12 hr tablet 600 mg  600 mg Oral BID [place] Eric J, DO       ipratropium-albuterol (DUONEB) 0.5-2.5 (3) MG/3ML nebulizer solution 3 mL  3 mL Nebulization Q6H PRN [place] Eric J, DO       methylPREDNISolone sodium succinate (SOLU-MEDROL) 125 mg/2 mL injection 125 mg  125 mg Intravenous Daily [place] Eric J, DO   125 mg at 04/04/22 0805   midodrine (PROAMATINE) tablet 2.5 mg  2.5 mg Oral Q8H [place] Donnamarie Poag, DO   2.5 mg at 04/04/22 5400   molnupiravir EUA (LAGEVRIO) capsule 800 mg  4 capsule Oral BID [place] Eric J, DO   800 mg at 04/04/22 0803   mometasone-formoterol (DULERA) 200-5 MCG/ACT inhaler 2 puff  2 puff Inhalation BID [place] Donnamarie Poag, DO   2 puff at 04/04/22 0806   ondansetron (ZOFRAN) tablet 4 mg  4 mg Oral Q6H PRN Lenore Cordia, MD       Or   ondansetron (ZOFRAN) injection 4 mg  4 mg Intravenous Q6H PRN Lenore Cordia, MD       pantoprazole (PROTONIX) EC tablet 40 mg  40 mg Oral QHS Adrian Saran, RPH   40 mg at 04/03/22 2142   phenylephrine (NEO-SYNEPHRINE) '20mg'$ /NS 240m premix infusion  0-400 mcg/min Intravenous Titrated WPolly Cobia RPH   Stopped at 04/03/22 08676  senna-docusate (Senokot-S) tablet 1 tablet  1 tablet Oral QHS PRN  PLenore Cordia MD       sodium chloride flush (NS) 0.9 % injection 10-40 mL  10-40 mL Intracatheter PRN [place] Eric J, DO       sodium chloride flush (NS) 0.9 % injection 3 mL  3 mL Intravenous Q12H PZada FindersR, MD   3 mL at 04/04/22 0812   venlafaxine XR (EFFEXOR-XR) 24 hr capsule 75 mg  75 mg Oral Q breakfast PLenore Cordia MD   75 mg at 04/04/22 0802     Discharge Medications: Please see discharge summary for a list of discharge medications.  Relevant Imaging Results:  Relevant Lab Results:   Additional Information SSN: [id number]  Vassie Moselle, LCSW

## 2022-04-04 NOTE — TOC Initial Note (Addendum)
Transition of Care [facility]) - Initial/Assessment Note    Patient Details  Name: Jordan [name] MRN: [id number] Date of Birth: 1950/08/27  Transition of Care [facility]) CM/SW Contact:    Vassie Moselle, LCSW Phone Number: 04/04/2022, 11:04 AM  Clinical Narrative:                 Met with pt to discuss discharge plans. Pt currently lives at home alone and has been privately paying for 24/7 PCS.  Pt shares that this has been outside of his budget and he is unsure how long he will be able to continue to afford these services.  Pt was recently discharged from [facility] in [place]. Pt is unsure how long he was at [facility]. Per chart review pt originally went to SNF on 02/09/22 and then was home for 2 weeks before going back to [facility] on 9/26.  CSW discussed w/ pt that due to recently being at SNF he may not have any covered days for SNF with his insurance. Pt is also COVID positive and would need to complete 10-day isolation period prior to transferring to SNF.  CSW discussed having home health as an option if he is unable to go to SNF. Pt shares that if his insurance will cover SNF placement he would like to go to [facility]. Pt has been referred out for SNF placement.    Update 2:30pm- CSW received call from [facility] in [place] who shared that this pt was at their SNF for 27 days. Pt will be in his copay days for SNF. She also shared that they had recommended LTC for pt after his first admission to their facility however, pt declined this recommendation.  CSW spoke with pt regarding him being in his co-pay days. Pt reports he does not feel he is able to return home at this time and is agreeable to pay copay amounts for SNF.   Update 4:00pm- CSW spoke with [facility] who shared that this pt would need to contact their business office and would have to pay for 7 days up front. CSW provided this information to pt who is to call the business office to discuss payment.    Expected Discharge Plan: [place] Barriers to Discharge: SNF Covid, Continued Medical Work up   Patient Goals and CMS Choice Patient states their goals for this hospitalization and ongoing recovery are:: To go to SNF CMS Medicare.gov Compare Post Acute Care list provided to:: Patient Choice offered to / list presented to : Patient  Expected Discharge Plan and Services Expected Discharge Plan: [place] In-house Referral: NA Discharge Planning Services: CM Consult Post Acute Care Choice: [place] Living arrangements for the past 2 months: Single Family Home                 DME Arranged: N/A DME Agency: NA                  Prior Living Arrangements/Services Living arrangements for the past 2 months: Single Family Home Lives with:: Self Patient language and need for interpreter reviewed:: Yes Do you feel safe going back to the place where you live?: Yes      Need for Family Participation in Patient Care: No (Comment) Care giver support system in place?: Yes (comment) Current home services: DME, Other (comment) (24/7 PCA) Criminal Activity/Legal Involvement Pertinent to Current Situation/Hospitalization: No - Comment as needed  Activities of Daily Living Home Assistive  Devices/Equipment: Gilford Rile (specify type) ADL Screening (condition at time of admission) Patient's cognitive ability adequate to safely complete daily activities?: Yes Is the patient deaf or have difficulty hearing?: No Does the patient have difficulty seeing, even when wearing glasses/contacts?: No Does the patient have difficulty concentrating, remembering, or making decisions?: No Patient able to express need for assistance with ADLs?: Yes Does the patient have difficulty dressing or bathing?: Yes Independently performs ADLs?: No Communication: Independent Dressing (OT): Needs assistance Is this a change from baseline?: Change from baseline, expected to  last <3days Grooming: Needs assistance Is this a change from baseline?: Change from baseline, expected to last <3 days Feeding: Independent Bathing: Needs assistance Is this a change from baseline?: Change from baseline, expected to last <3 days Toileting: Needs assistance Is this a change from baseline?: Change from baseline, expected to last <3 days In/Out Bed: Needs assistance Is this a change from baseline?: Change from baseline, expected to last <3 days Walks in Home: Needs assistance Is this a change from baseline?: Pre-admission baseline Does the patient have difficulty walking or climbing stairs?: Yes Weakness of Legs: Both Weakness of Arms/Hands: None  Permission Sought/Granted Permission sought to share information with : Facility Art therapist granted to share information with : No              Emotional Assessment Appearance:: Appears stated age Attitude/Demeanor/Rapport: Engaged Affect (typically observed): Pleasant Orientation: : Oriented to Self, Oriented to Place, Oriented to  Time, Oriented to Situation Alcohol / Substance Use: Not Applicable Psych Involvement: No (comment)  Admission diagnosis:  Dehydration [E86.0] SIRS (systemic inflammatory response syndrome) (HCC) [R65.10] Sepsis, due to unspecified organism, unspecified whether acute organ dysfunction present [facility]) [A41.9] Patient Active Problem List   Diagnosis Date Noted   SIRS (systemic inflammatory response syndrome) ([place]) 04/01/2022   Delirium 04/01/2022   Normocytic anemia 04/01/2022   Depression with anxiety 04/01/2022   OA (osteoarthritis) of knee 02/07/2022   Osteoarthritis of right knee 02/07/2022   Preoperative cardiovascular examination 11/03/2021   Sinus bradycardia 11/03/2021   Stage 3 chronic kidney disease ([place]) 08/30/2021   Aortic atherosclerosis ([place]) 08/30/2021   Chronic respiratory failure with hypoxia ([place]) 02/23/2021   Anxiety    Persistent atrial  fibrillation ([place]) 01/03/2021   Open wound of left foot 12/30/2020   Macrocytic anemia 12/30/2020   Arthritis 12/30/2020   Chronic heart failure with preserved ejection fraction ([place]) 12/30/2020   Left leg cellulitis 12/29/2020   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   Diabetes mellitus without complication (HCC)    COPD (chronic obstructive pulmonary disease) (HCC)    CAD (coronary artery disease)    Severe aortic stenosis s/p TAVR in Jan 2022    GERD (gastroesophageal reflux disease)    Protein-calorie malnutrition, severe 03/05/2020   Tobacco abuse 03/03/2020   Pressure injury of skin 03/15/2017   Chronic venous insufficiency 04/28/2014   PAD (peripheral artery disease) ([place]) 01/21/2014   Varicose veins of lower extremities with other complications 14/97/0263   Hx of TIA (transient ischemic attack) and stroke 12/31/2013   HLD (hyperlipidemia) 12/31/2013   Severe obesity (BMI >= 40) ([place]) 12/31/2013   Essential hypertension 09/13/2012   Edema 09/13/2012   PCP:  [name], MD Pharmacy:   [id number]: [phone]: [phone]    Social Determinants of Health (SDOH) Interventions    Readmission Risk Interventions  04/04/2022   11:02 AM  Readmission Risk Prevention Plan  Transportation Screening Complete  PCP or Specialist Appt within 3-5 Days Complete  HRI or [place] Complete  Social Work Consult for [place] Planning/Counseling Complete  Palliative Care Screening Not Applicable  Medication Review Press photographer) Complete

## 2022-04-04 NOTE — Progress Notes (Signed)
PROGRESS NOTE    Jordan [name]  ZYY:[id number] DOB: 01-10-1951 DOA: 04/01/2022 PCP: [name], MD    Brief Narrative:   Jordan [name] is a 71 y.o. male with past medical history significant for persistent atrial fibrillation on Eliquis, HFpEF (EF 55-60% on 07/14/2021), severe AS s/p TAVR, COPD, chronic respiratory failure with hypoxia on 2 L O2 via [place] prn, CAD, HTN, HLD, PVD, normocytic anemia, chronic venous stasis dermatitis, s/p right TKA 02/07/2022, depression/anxiety who presented to the ED from home for evaluation of confusion. Patient was recently discharged to home where he lives alone from rehab facility.  He has had 24/7 home health nursing assistance at home.  His home health nurse noted that he was confused and having apparent hallucinations at nighttime therefore EMS were called and he was brought to the ED for further evaluation.  He reports recent issues with low blood pressure as well as intermittent episodes of cellulitis.  He has been having on and off pain with his right knee since his knee replacement on 8/21 but no significant changes recently.   He denies any subjective fevers, chills, diaphoresis, nausea, vomiting, chest pain, dyspnea, cough, abdominal pain, dysuria.  He reported recent issues with constipation but has been having regular bowel movements the last 4-5 days.  He states he uses 2 L supplemental O2 via [place] only as needed at home.  On admitting evaluation, patient is awake, alert, and fully oriented.  He does acknowledge feeling confused at nighttime and states that he was seen people who were not actually present in his home.  He recognized these people as his nurses from the rehab facility.    In the ED, BP 93/53, pulse 109, RR 16, temp 97.8 F, SPO2 96% on room air. While in the ED patient tachypneic with RR up to 25 and tachycardic with HR up to 133. Labs show sodium 136, potassium 3.7, bicarb 29, BUN 29, creatinine 1.24, serum glucose 84, AST 42,  ALT 25, alk phos 103, total bilirubin 1.3, ammonia 18, lactic acid 1.8, WBC 3.8, hemoglobin 9.9, platelets 172,000. Urinalysis negative for UTI.  Respiratory panel and single blood culture ordered and pending collection. Portable chest x-ray negative for focal consolidation, edema, effusion. Patient was given 2 L normal saline with additional 2.5 L LR ordered.  Patient was ordered to receive IV vancomycin, cefepime, Flagyl.  The hospitalist service was consulted to admit for further evaluation and management.  Assessment & Plan:   Shock, undifferentiated Patient presenting with hypotension.  Patient positive for COVID but no other infectious etiology elucidated.  Patient has history of chronic diastolic congestive heart failure with preserved LVEF.  Patient was adequately fluid resuscitated with 4.5 L of IV fluids followed by continuous infusion 150 mL/h.  Despite this, patient remained hypotensive and lethargic.  PCCM was consulted and patient was transferred to the intensive care unit on peripheral phenylephrine drip.  Patient was given additional IV fluid bolus, started on IV steroids with improvement of his blood pressure.  Phenylephrine was closely titrated off.  Patient now less lethargic and alert.  Unclear etiology, possibly due to COVID infection, severe dehydration versus medication side effect with anxiolytics he was taking outpatient. -- midodrine 2.5 mg p.o. 3 times daily; Hold fro SBP >125 -- continue to monitor BP closely  COVID-19 viral infection Presenting with tachycardia, tachypnea, and hypotension.  WBC 3.8.  No obvious infectious source.  Urinalysis negative for UTI.  CXR negative for pneumonia.  Has some swelling to  right knee without erythema, surgical site appears well-healed.  Ultrasound right knee unrevealing.  COVID-19 PCR positive.  Influenza A/B PCR negative.   -- molnupiravir 859m PO BID x 5 days -- Titrate down Solu-Medrol to 80 mg IV q24h -- Supportive care,  antipyretics  Hypokalemia: Resolved Potassium 4.4 this morning --Repeat electrolytes in a.m.   Delirium: Resolved History sounds more like delirious episode at nighttime after recent discharge back to home from rehab, confounded by acute COVID-19 viral infection.  He is fully oriented at time of admission. -- Delirium precautions   Persistent atrial fibrillation ([place] Remains in atrial fibrillation with intermittent tachycardia. -- Restart lower dose atenolol 12.5 mg daily -- Continue Eliquis 525mPO BID -- Continue monitor on telemetry  Chronic heart failure with preserved ejection fraction (HCC) Hypotensive and hypovolemic on admission.  Receiving IV fluid hydration.  Last EF 55-60% by TTE 07/14/2021. --Holding Lasix --Monitor strict I/O's and daily weights   Essential hypertension --Atenolol 12.5 mg p.o. daily --Continue to monitor BP closely   Chronic respiratory failure with hypoxia (HCC) Uses 2 L O2 via [place] as needed at home.   --Continue supplemental oxygen as needed, goal SpO2 >/= 88%.   COPD (chronic obstructive pulmonary disease) (HCC) Stable, continue Dulera, albuterol and DuoNebs as needed.   Depression with anxiety Continue Effexor and doxepin.   Normocytic anemia Hemoglobin stable at 10.2   CAD (coronary artery disease) Continue atorvastatin and Eliquis.   HLD (hyperlipidemia) Continue atorvastatin  Weakness/deconditioning/debility: Currently resides at home with home health RN/PT.  Recently discharged from SNF. -- PT/OT recommend SNF, TOC consulted for assistance in placement   DVT prophylaxis: Eliquis apixaban (ELIQUIS) tablet 5 mg    Code Status: DNR Family Communication: No family present at bedside this morning, updated [name] via telephone this morning  Disposition Plan:  Level of care: Telemetry Status is: Inpatient Remains inpatient appropriate because: Trending down IV steroids, pending SNF placement   Consultants:  PCCM  Procedures:   None  Antimicrobials:  Vancomycin 10/13 - 10/13 Cefepime 10/13 - 10/13 Metronidazole 10/13 - 10/13   Subjective: Patient seen examined bedside, resting calmly.  Lying in bed.  Eating breakfast.  No complaints this morning.  Blood pressure remains much improved. No specific complaints this morning.  Denies headache, no visual changes, no chest pain, no palpitation, no shortness of breath, no abdominal pain, no fever/chills, no nausea/vomiting/diarrhea, no focal weakness.  No acute events overnight per nursing staff.  Objective: Vitals:   04/03/22 2036 04/04/22 0519 04/04/22 0614 04/04/22 1342  BP: 132/80 117/64  (!) 144/70  Pulse: 99 (!) 110  80  Resp: 18 20  18   Temp: 98.1 F (36.7 C) 98.2 F (36.8 C)    TempSrc: Oral Oral    SpO2: 100% 98%    Weight: 73.9 kg  76 kg   Height:        Intake/Output Summary (Last 24 hours) at 04/04/2022 1349 Last data filed at 04/03/2022 1900 Gross per 24 hour  Intake 548.26 ml  Output 600 ml  Net -51.74 ml   Filed Weights   04/03/22 0403 04/03/22 2036 04/04/22 0614  Weight: 73.3 kg 73.9 kg 76 kg    Examination:  Physical Exam: GEN: NAD, alert and oriented x 3, chronically ill appearance HEENT: NCAT, PERRL, EOMI, sclera clear, MMM PULM: CTAB w/o wheezes/crackles, normal respiratory effort, on 2 L nasal cannula CV: Tachycardic, irregularly irregular rhythm w/o M/G/R GI: abd soft, NTND, NABS, no R/G/M MSK: Right knee with mild  edema, nontender to palpation, active/passive range of motion, chronic venous stasis dermatitis noted bilateral lower extremities anterior shins, trace bilateral lower extremity peripheral edema, moves all extremities independently NEURO: CN II-XII intact, no focal deficits, sensation to light touch intact PSYCH: normal mood/affect Integumentary: dry/intact, no rashes or wounds    Data Reviewed: I have personally reviewed following labs and imaging studies  CBC: Recent Labs  Lab 04/01/22 1940  04/02/22 0405 04/02/22 1558 04/03/22 0523  WBC 3.8* 6.7 6.8 1.7*  NEUTROABS 2.4  --   --   --   HGB 9.9* 10.2* 9.8* 8.9*  HCT 31.7* 32.9* 31.7* 28.9*  MCV 86.4 88.2 87.6 87.3  PLT 172 161 198 191   Basic Metabolic Panel: Recent Labs  Lab 04/01/22 1940 04/02/22 0405 04/03/22 0523 04/04/22 0310  NA 136 139 139 133*  K 3.7 3.6 3.0* 4.4  CL 100 103 106 105  CO2 29 28 27 24   GLUCOSE 84 86 185* 162*  BUN 29* 25* 24* 32*  CREATININE 1.24 1.15 1.10 1.37*  CALCIUM 8.1* 7.9* 7.9* 7.7*  MG  --   --  1.9 2.2   GFR: Estimated Creatinine Clearance: 53.2 mL/min (A) (by C-G formula based on SCr of 1.37 mg/dL (H)). Liver Function Tests: Recent Labs  Lab 04/01/22 1940 04/02/22 0405 04/03/22 0523  AST 42* 45* 41  ALT 25 27 22   ALKPHOS 103 101 80  BILITOT 1.3* 1.2 1.3*  PROT 6.0* 5.8* 5.2*  ALBUMIN 3.2* 2.9* 2.7*   No results for input(s): "LIPASE", "AMYLASE" in the last 168 hours. Recent Labs  Lab 04/01/22 1940  AMMONIA 18   Coagulation Profile: No results for input(s): "INR", "PROTIME" in the last 168 hours. Cardiac Enzymes: Recent Labs  Lab 04/02/22 1558  CKTOTAL 122  CKMB 2.3   BNP (last 3 results) No results for input(s): "PROBNP" in the last 8760 hours. HbA1C: No results for input(s): "HGBA1C" in the last 72 hours. CBG: Recent Labs  Lab 04/01/22 1926 04/02/22 1427 04/03/22 0018  GLUCAP 65* 82 133*   Lipid Profile: No results for input(s): "CHOL", "HDL", "LDLCALC", "TRIG", "CHOLHDL", "LDLDIRECT" in the last 72 hours. Thyroid Function Tests: No results for input(s): "TSH", "T4TOTAL", "FREET4", "T3FREE", "THYROIDAB" in the last 72 hours. Anemia Panel: No results for input(s): "VITAMINB12", "FOLATE", "FERRITIN", "TIBC", "IRON", "RETICCTPCT" in the last 72 hours. Sepsis Labs: Recent Labs  Lab 04/01/22 1940 04/02/22 0405 04/02/22 1558  PROCALCITON  --  <0.10 <0.10  LATICACIDVEN 1.8  --  1.1    Recent Results (from the past 240 hour(s))  Resp Panel by  RT-PCR (Flu A&B, Covid)     Status: Abnormal   Collection Time: 04/01/22 11:13 PM   Specimen: Nasal Swab  Result Value Ref Range Status   SARS Coronavirus 2 by RT PCR POSITIVE (A) NEGATIVE Final    Comment: (NOTE) SARS-CoV-2 target nucleic acids are DETECTED.  The SARS-CoV-2 RNA is generally detectable in upper respiratory specimens during the acute phase of infection. Positive results are indicative of the presence of the identified virus, but do not rule out bacterial infection or co-infection with other pathogens not detected by the test. Clinical correlation with patient history and other diagnostic information is necessary to determine patient infection status. The expected result is Negative.  Fact Sheet for Patients: EntrepreneurPulse.com.au  Fact Sheet for Healthcare Providers: IncredibleEmployment.be  This test is not yet approved or cleared by the [place] FDA and  has been authorized for detection and/or diagnosis of SARS-CoV-2  by FDA under an Emergency Use Authorization (EUA).  This EUA will remain in effect (meaning this test can be used) for the duration of  the COVID-19 declaration under Section 564(b)(1) of the A ct, 21 U.S.C. section 360bbb-3(b)(1), unless the authorization is terminated or revoked sooner.     Influenza A by PCR NEGATIVE NEGATIVE Final   Influenza B by PCR NEGATIVE NEGATIVE Final    Comment: (NOTE) The Xpert Xpress SARS-CoV-2/FLU/RSV plus assay is intended as an aid in the diagnosis of influenza from Nasopharyngeal swab specimens and should not be used as a sole basis for treatment. Nasal washings and aspirates are unacceptable for Xpert Xpress SARS-CoV-2/FLU/RSV testing.  Fact Sheet for Patients: EntrepreneurPulse.com.au  Fact Sheet for Healthcare Providers: IncredibleEmployment.be  This test is not yet approved or cleared by the [place] FDA and has been  authorized for detection and/or diagnosis of SARS-CoV-2 by FDA under an Emergency Use Authorization (EUA). This EUA will remain in effect (meaning this test can be used) for the duration of the COVID-19 declaration under Section 564(b)(1) of the Act, 21 U.S.C. section 360bbb-3(b)(1), unless the authorization is terminated or revoked.  Performed at [facility], [place] [address] [place], [place] [address]   Culture, blood (single)     Status: None (Preliminary result)   Collection Time: 04/01/22 11:13 PM   Specimen: BLOOD RIGHT HAND  Result Value Ref Range Status   Specimen Description   Final    BLOOD RIGHT HAND Performed at [facility] [address] [place], [place] [address]    Special Requests   Final    BOTTLES DRAWN AEROBIC AND ANAEROBIC Blood Culture results may not be optimal due to an inadequate volume of blood received in culture bottles Performed at [facility] [address] [place], [place] [address]    Culture   Final    NO GROWTH 2 DAYS Performed at [facility] [address], [place], [place] [address]    Report Status PENDING  Incomplete  MRSA Next Gen by PCR, Nasal     Status: None   Collection Time: 04/02/22  1:57 PM   Specimen: Nasal Mucosa; Nasal Swab  Result Value Ref Range Status   MRSA by PCR Next Gen NOT DETECTED NOT DETECTED Final    Comment: (NOTE) The GeneXpert MRSA Assay (FDA approved for NASAL specimens only), is one component of a comprehensive MRSA colonization surveillance program. It is not intended to diagnose MRSA infection nor to guide or monitor treatment for MRSA infections. Test performance is not FDA approved in patients less than 39 years old. Performed at [facility], [place] [address], [place],  [address]          Radiology Studies: DG CHEST PORT 1 VIEW  Result Date: 04/02/2022 CLINICAL DATA:  PICC line placement EXAM: PORTABLE  CHEST 1 VIEW COMPARISON:  04/02/2022 FINDINGS: Left PICC line is in place with the tip in the right atrium approximately 3 cm deep to the cavoatrial junction. Heart is normal size. No confluent opacities or effusions. No acute bony abnormality. IMPRESSION: Left PICC line tip in the upper right atrium 3 cm deep to the cavoatrial junction. Electronically Signed   By: Rolm Baptise M.D.   On: 04/02/2022 19:06   DG CHEST PORT 1 VIEW  Result Date: 04/02/2022 CLINICAL DATA:  Coronavirus infection.  Tachycardia. EXAM: PORTABLE CHEST 1 VIEW COMPARISON:  04/01/2022 FINDINGS: Heart size upper limits of normal. Aortic  atherosclerotic calcification as seen previously. The lungs are clear. No infiltrate, collapse or effusion. IMPRESSION: No active disease. Aortic atherosclerotic calcification. Electronically Signed   By: Nelson Chimes M.D.   On: 04/02/2022 16:59   [place] EKG SITE RITE  Result Date: 04/02/2022 If Site Rite image not attached, placement could not be confirmed due to current cardiac rhythm.       Scheduled Meds:  apixaban  5 mg Oral BID   atenolol  12.5 mg Oral Daily   atorvastatin  80 mg Oral QODAY   Chlorhexidine Gluconate Cloth  6 each Topical Daily   guaiFENesin  600 mg Oral BID   methylPREDNISolone (SOLU-MEDROL) injection  125 mg Intravenous Daily   midodrine  2.5 mg Oral Q8H   molnupiravir EUA  4 capsule Oral BID   mometasone-formoterol  2 puff Inhalation BID   pantoprazole  40 mg Oral QHS   sodium chloride flush  3 mL Intravenous Q12H   venlafaxine XR  75 mg Oral Q breakfast   Continuous Infusions:  sodium chloride Stopped (04/03/22 1602)   sodium chloride 75 mL/hr at 04/04/22 0813   phenylephrine (NEO-SYNEPHRINE) Adult infusion Stopped (04/03/22 0527)     LOS: 2 days    Time spent: 48 minutes spent on chart review, discussion with nursing staff, consultants, updating family and interview/physical exam; more than 50% of that time was spent in counseling and/or coordination of  care.    Jasman Pfeifle J [place] DO [facility] Available via Epic secure chat 7am-7pm After these hours, please refer to coverage provider listed on [email or web address] 04/04/2022, 1:49 PM

## 2022-04-05 DIAGNOSIS — R6510 Systemic inflammatory response syndrome (SIRS) of non-infectious origin without acute organ dysfunction: Secondary | ICD-10-CM | POA: Diagnosis not present

## 2022-04-05 LAB — BASIC METABOLIC PANEL
Anion gap: 4 — ABNORMAL LOW (ref 5–15)
BUN: 23 mg/dL (ref 8–23)
CO2: 26 mmol/L (ref 22–32)
Calcium: 7.7 mg/dL — ABNORMAL LOW (ref 8.9–10.3)
Chloride: 105 mmol/L (ref 98–111)
Creatinine, Ser: 0.97 mg/dL (ref 0.61–1.24)
GFR, Estimated: >60 mL/min (ref >60)
Glucose, Bld: 130 mg/dL — ABNORMAL HIGH (ref 70–99)
Potassium: 4.4 mmol/L (ref 3.5–5.1)
Sodium: 135 mmol/L (ref 135–145)

## 2022-04-05 MED ORDER — ATENOLOL 25 MG PO TABS
25.0000 mg | ORAL_TABLET | Freq: Every day | ORAL | Status: DC
  Administered 2022-04-05 – 2022-04-12 (×8): 25 mg via ORAL
  Filled 2022-04-05 (×7): qty 1

## 2022-04-05 MED ORDER — METHYLPREDNISOLONE SODIUM SUCC 40 MG IJ SOLR
40.0000 mg | Freq: Every day | INTRAMUSCULAR | Status: DC
  Administered 2022-04-06 – 2022-04-07 (×2): 40 mg via INTRAVENOUS
  Filled 2022-04-05 (×2): qty 1

## 2022-04-05 NOTE — Progress Notes (Signed)
Physical Therapy Treatment Patient Details Name: Jordan [name] MRN: [id number] DOB: [birth year] Today's Date: 04/05/2022   History of Present Illness Pt admitted from home 2* SOB and confusion with dx of COVID. Pt with hx of CAsd, afib, CHF, COPD (on 2L O2 as needed at home, PVD, severe aortic stenosis, s/p TAVR, Bil LE edema, and R TKR 02/07/22    PT Comments    Patient eager to mobilize with therapy and min assist required to move to EOB this session with pt requiring extra time to sit up and assist to raise trunk. Pt able to stand from elevated EOB with min assist to steady. Pt limited due to bowel incontinence and transferred to [facility]. NT arrived to assist with hygiene/wash up. EOS pt ambulated short bout to move to recliner. He will benefit from continued PT to progress mobility as able. Continue to recommend HHPT for follow up.    Recommendations for follow up therapy are one component of a multi-disciplinary discharge planning process, led by the attending physician.  Recommendations may be updated based on patient status, additional functional criteria and insurance authorization.  PT Recommendation   Follow Up Recommendations Home health PT Filed 04/05/2022 1022  Can patient physically be transported by private vehicle Yes Filed 04/05/2022 1022  Assistance recommended at discharge Frequent or constant Supervision/Assistance Filed 04/05/2022 1022  Patient can return home with the following A little help with walking and/or transfers, A little help with bathing/dressing/bathroom, Assistance with cooking/housework, Assist for transportation, Help with stairs or ramp for entrance Filed 04/05/2022 1022  Functional Status Assessment Patient has had a recent decline in their functional status and demonstrates the ability to make significant improvements in function in a reasonable and predictable amount of time. Filed 04/03/2022 1507   Precautions / Restrictions       Mobility  Bed  Mobility Overal bed mobility: Needs Assistance Bed Mobility: Supine to Sit       Sit to supine: Min assist   General bed mobility comments: Assist to bring trunk to upright and to complete rotation to EOB sitting with bed pad    Transfers Overall transfer level: Needs assistance Equipment used: Rolling walker (2 wheels) Transfers: Sit to/from Stand Sit to Stand: Min assist, From elevated surface   Step pivot transfers: Min assist, From elevated surface       General transfer comment: cues for hand placement on RW to rise from EOB. pt required min assist to power up. gait deffered due to uncontrolled BM. Step pivot to [facility] with min assist and RW.    Ambulation/Gait Ambulation/Gait assistance: Min assist Gait Distance (Feet): 5 Feet Assistive device: Rolling walker (2 wheels) Gait Pattern/deviations: Step-through pattern, Decreased stride length, Shuffle Gait velocity: decr     General Gait Details: small steps to transfer bed>BSC. then ambulated forward and turned with min assist to manage RW to sit in recliner.   Stairs             Wheelchair Mobility    Modified Rankin (Stroke Patients Only)       Balance Overall balance assessment: Needs assistance Sitting-balance support: Feet supported Sitting balance-Leahy Scale: Fair Sitting balance - Comments: pt able to wash up trunk sitting on BSC. NT present and asissting as well   Standing balance support: Bilateral upper extremity supported Standing balance-Leahy Scale: Poor  Cognition Arousal/Alertness: Awake/alert Behavior During Therapy: WFL for tasks assessed/performed, Anxious Overall Cognitive Status: Within Functional Limits for tasks assessed                                          Exercises      General Comments        Pertinent Vitals/Pain Pain Assessment Pain Assessment: No/denies pain Pain Intervention(s): Limited activity  within patient's tolerance, Monitored during session, Repositioned     04/05/22 1022  PT - End of Session  Equipment Utilized During Treatment Gait belt  Activity Tolerance Patient tolerated treatment well  Patient left in chair;with call bell/phone within reach;with chair alarm set;with nursing/sitter in room  Nurse Communication Mobility status   PT - Assessment/Plan  PT Plan Current plan remains appropriate  PT Visit Diagnosis Muscle weakness (generalized) (M62.81);Difficulty in walking, not elsewhere classified (R26.2)  PT Frequency (ACUTE ONLY) Min 3X/week  Follow Up Recommendations Home health PT  Can patient physically be transported by private vehicle Yes  Assistance recommended at discharge Frequent or constant Supervision/Assistance  Patient can return home with the following A little help with walking and/or transfers;A little help with bathing/dressing/bathroom;Assistance with cooking/housework;Assist for transportation;Help with stairs or ramp for entrance  PT equipment None recommended by PT  AM-PAC PT "6 Clicks" Mobility Outcome Measure (Version 2)  Help needed turning from your back to your side while in a flat bed without using bedrails? 3  Help needed moving from lying on your back to sitting on the side of a flat bed without using bedrails? 3  Help needed moving to and from a bed to a chair (including a wheelchair)? 3  Help needed standing up from a chair using your arms (e.g., wheelchair or bedside chair)? 3  Help needed to walk in hospital room? 3  Help needed climbing 3-5 steps with a railing?  1  6 Click Score 16  Consider Recommendation of Discharge To: Home with [facility]  Progressive Mobility  What is the highest level of mobility based on the progressive mobility assessment? Level 3 (Stands with assist) - Balance while standing  and cannot march in place  Mobility Referral No  Activity Stood at bedside;Transferred to/from [facility];Transferred from bed to chair  PT Goal  Progression  Progress towards PT goals Progressing toward goals  Acute Rehab PT Goals  PT Goal Formulation With patient  Time For Goal Achievement 04/17/22  Potential to Achieve Goals Fair  PT Time Calculation  PT Start Time (ACUTE ONLY) 1007  PT Stop Time (ACUTE ONLY) 1049  PT Time Calculation (min) (ACUTE ONLY) 42 min  PT General Charges  $$ ACUTE PT VISIT 1 Visit  PT Treatments  $Therapeutic Activity 23-37 mins     Verner Mould, DPT Acute Rehabilitation Services Office [phone]  04/05/22 4:31 PM

## 2022-04-05 NOTE — TOC Progression Note (Signed)
Transition of Care [facility]) - Progression Note    Patient Details  Name: Jordan [name] MRN: [id number] Date of Birth: [birth year]  Transition of Care [facility]) CM/SW [place], [place] Phone Number: 04/05/2022, 2:58 PM  Clinical Narrative:    Pt spoke with Isaias Cowman and will be setting up payment for SNF through their business office.  Pt is COVID positive and will not be able to transfer to SNF until he has completed his isolation period.    Expected Discharge Plan: Skilled Nursing Facility Barriers to Discharge: SNF Covid, Continued Medical Work up  Expected Discharge Plan and Services Expected Discharge Plan: [place] In-house Referral: NA Discharge Planning Services: CM Consult Post Acute Care Choice: [place] Living arrangements for the past 2 months: Single Family Home                 DME Arranged: N/A DME Agency: NA                   Social Determinants of Health (SDOH) Interventions    Readmission Risk Interventions    04/04/2022   11:02 AM  Readmission Risk Prevention Plan  Transportation Screening Complete  PCP or Specialist Appt within 3-5 Days Complete  HRI or [place] Complete  Social Work Consult for [place] Planning/Counseling Complete  Palliative Care Screening Not Applicable  Medication Review Press photographer) Complete

## 2022-04-05 NOTE — Progress Notes (Signed)
PROGRESS NOTE    Jordan [name]  MRN:[id number] DOB: 11/15/1950 DOA: 04/01/2022 PCP: South, Stephen, MD    Brief Narrative:   Jordan [name] is a 70 y.o. male with past medical history significant for persistent atrial fibrillation on Eliquis, HFpEF (EF 55-60% on 07/14/2021), severe AS s/p TAVR, COPD, chronic respiratory failure with hypoxia on 2 L O2 via [place] prn, CAD, HTN, HLD, PVD, normocytic anemia, chronic venous stasis dermatitis, s/p right TKA 02/07/2022, depression/anxiety who presented to the ED from home for evaluation of confusion. Patient was recently discharged to home where he lives alone from rehab facility.  He has had 24/7 home health nursing assistance at home.  His home health nurse noted that he was confused and having apparent hallucinations at nighttime therefore EMS were called and he was brought to the ED for further evaluation.  He reports recent issues with low blood pressure as well as intermittent episodes of cellulitis.  He has been having on and off pain with his right knee since his knee replacement on 8/21 but no significant changes recently.   He denies any subjective fevers, chills, diaphoresis, nausea, vomiting, chest pain, dyspnea, cough, abdominal pain, dysuria.  He reported recent issues with constipation but has been having regular bowel movements the last 4-5 days.  He states he uses 2 L supplemental O2 via [place] only as needed at home.  On admitting evaluation, patient is awake, alert, and fully oriented.  He does acknowledge feeling confused at nighttime and states that he was seen people who were not actually present in his home.  He recognized these people as his nurses from the rehab facility.    In the ED, BP 93/53, pulse 109, RR 16, temp 97.8 F, SPO2 96% on room air. While in the ED patient tachypneic with RR up to 25 and tachycardic with HR up to 133. Labs show sodium 136, potassium 3.7, bicarb 29, BUN 29, creatinine 1.24, serum glucose 84, AST 42,  ALT 25, alk phos 103, total bilirubin 1.3, ammonia 18, lactic acid 1.8, WBC 3.8, hemoglobin 9.9, platelets 172,000. Urinalysis negative for UTI.  Respiratory panel and single blood culture ordered and pending collection. Portable chest x-ray negative for focal consolidation, edema, effusion. Patient was given 2 L normal saline with additional 2.5 L LR ordered.  Patient was ordered to receive IV vancomycin, cefepime, Flagyl.  The hospitalist service was consulted to admit for further evaluation and management.  Assessment & Plan:   Shock, undifferentiated: Resolved Patient presenting with hypotension.  Patient positive for COVID but no other infectious etiology elucidated.  Patient has history of chronic diastolic congestive heart failure with preserved LVEF.  Patient was adequately fluid resuscitated with 4.5 L of IV fluids followed by continuous infusion 150 mL/h.  Despite this, patient remained hypotensive and lethargic.  PCCM was consulted and patient was transferred to the intensive care unit on peripheral phenylephrine drip.  Patient was given additional IV fluid bolus, started on IV steroids with improvement of his blood pressure.  Phenylephrine was closely titrated off.  Patient now less lethargic and alert.  Unclear etiology, possibly due to COVID infection, severe dehydration versus medication side effect with anxiolytics he was taking outpatient. -- Discontinued midodrine as blood pressure now has stabilized -- continue to monitor BP closely  COVID-19 viral infection Presenting with tachycardia, tachypnea, and hypotension.  WBC 3.8.  No obvious infectious source.  Urinalysis negative for UTI.  CXR negative for pneumonia.  Has some swelling to right knee   without erythema, surgical site appears well-healed.  Ultrasound right knee unrevealing.  COVID-19 PCR positive.  Influenza A/B PCR negative.   -- molnupiravir 800mg PO BID x 5 days -- Titrate down Solu-Medrol to 40 mg IV q24h -- Supportive  care, antipyretics  Hypokalemia: Resolved Potassium 4.4 this morning --Repeat electrolytes in a.m.   Delirium: Resolved History sounds more like delirious episode at nighttime after recent discharge back to home from rehab, confounded by acute COVID-19 viral infection.  He is fully oriented at time of admission. -- Delirium precautions   Persistent atrial fibrillation (HCC) Remains in atrial fibrillation with intermittent tachycardia. -- Increase atenolol  to 25 mg daily -- Continue Eliquis 5mg PO BID -- Continue monitor on telemetry  Chronic heart failure with preserved ejection fraction (HCC) Hypotensive and hypovolemic on admission.  Receiving IV fluid hydration.  Last EF 55-60% by TTE 07/14/2021. --Holding Lasix --Monitor strict I/O's and daily weights   Essential hypertension --Atenolol 25 mg p.o. daily --Continue to monitor BP closely   Chronic respiratory failure with hypoxia (HCC) Uses 2 L O2 via [place] as needed at home.   --Continue supplemental oxygen as needed, goal SpO2 >/= 88%.   COPD (chronic obstructive pulmonary disease) (HCC) Stable, continue Dulera, albuterol and DuoNebs as needed.   Depression with anxiety Continue Effexor and doxepin.   Normocytic anemia Hemoglobin stable at 10.2   CAD (coronary artery disease) Continue atorvastatin and Eliquis.   HLD (hyperlipidemia) Continue atorvastatin  Weakness/deconditioning/debility: Currently resides at home with home health RN/PT.  Recently discharged from SNF. -- PT/OT recommend SNF, TOC consulted for assistance in placement   DVT prophylaxis: Eliquis apixaban (ELIQUIS) tablet 5 mg    Code Status: DNR Family Communication: No family present at bedside this morning, updated [name] via telephone yesterday morning  Disposition Plan:  Level of care: Telemetry Status is: Inpatient Remains inpatient appropriate because: Titrating down IV steroids, pending SNF placement   Consultants:  PCCM, now signed  off  Procedures:  None  Antimicrobials:  Vancomycin 10/13 - 10/13 Cefepime 10/13 - 10/13 Metronidazole 10/13 - 10/13   Subjective: Patient seen examined bedside, resting calmly.  Lying in bed.  Eating breakfast.  No complaints this morning.  Blood pressure remains much improved. No specific complaints this morning.  Denies headache, no visual changes, no chest pain, no palpitation, no shortness of breath, no abdominal pain, no fever/chills, no nausea/vomiting/diarrhea, no focal weakness.  No acute events overnight per nursing staff.  Awaiting SNF placement.  Objective: Vitals:   04/04/22 2021 04/04/22 2114 04/05/22 0500 04/05/22 0519  BP: 137/76   (!) 147/77  Pulse: 84   77  Resp: 20   16  Temp: 98.4 F (36.9 C)   97.9 F (36.6 C)  TempSrc: Oral     SpO2: 96% 98%  100%  Weight:   77.7 kg   Height:        Intake/Output Summary (Last 24 hours) at 04/05/2022 1228 Last data filed at 04/05/2022 1100 Gross per 24 hour  Intake 970.29 ml  Output 3100 ml  Net -2129.71 ml   Filed Weights   04/03/22 2036 04/04/22 0614 04/05/22 0500  Weight: 73.9 kg 76 kg 77.7 kg    Examination:  Physical Exam: GEN: NAD, alert and oriented x 3, chronically ill appearance HEENT: NCAT, PERRL, EOMI, sclera clear, MMM PULM: CTAB w/o wheezes/crackles, normal respiratory effort, on 2 L nasal cannula with SPO2 100% at rest CV: Tachycardic, irregularly irregular rhythm w/o M/G/R GI: abd soft, NTND,   NABS, no R/G/M MSK: Right knee with mild edema, nontender to palpation, active/passive range of motion, chronic venous stasis dermatitis noted bilateral lower extremities anterior shins, trace bilateral lower extremity peripheral edema, moves all extremities independently NEURO: CN II-XII intact, no focal deficits, sensation to light touch intact PSYCH: normal mood/affect Integumentary: dry/intact, no rashes or wounds    Data Reviewed: I have personally reviewed following labs and imaging  studies  CBC: Recent Labs  Lab 04/01/22 1940 04/02/22 0405 04/02/22 1558 04/03/22 0523  WBC 3.8* 6.7 6.8 1.7*  NEUTROABS 2.4  --   --   --   HGB 9.9* 10.2* 9.8* 8.9*  HCT 31.7* 32.9* 31.7* 28.9*  MCV 86.4 88.2 87.6 87.3  PLT 172 161 198 182   Basic Metabolic Panel: Recent Labs  Lab 04/01/22 1940 04/02/22 0405 04/03/22 0523 04/04/22 0310 04/05/22 0330  NA 136 139 139 133* 135  K 3.7 3.6 3.0* 4.4 4.4  CL 100 103 106 105 105  CO2 29 28 27 24 26  GLUCOSE 84 86 185* 162* 130*  BUN 29* 25* 24* 32* 23  CREATININE 1.24 1.15 1.10 1.37* 0.97  CALCIUM 8.1* 7.9* 7.9* 7.7* 7.7*  MG  --   --  1.9 2.2  --    GFR: Estimated Creatinine Clearance: 76.8 mL/min (by C-G formula based on SCr of 0.97 mg/dL). Liver Function Tests: Recent Labs  Lab 04/01/22 1940 04/02/22 0405 04/03/22 0523  AST 42* 45* 41  ALT 25 27 22  ALKPHOS 103 101 80  BILITOT 1.3* 1.2 1.3*  PROT 6.0* 5.8* 5.2*  ALBUMIN 3.2* 2.9* 2.7*   No results for input(s): "LIPASE", "AMYLASE" in the last 168 hours. Recent Labs  Lab 04/01/22 1940  AMMONIA 18   Coagulation Profile: No results for input(s): "INR", "PROTIME" in the last 168 hours. Cardiac Enzymes: Recent Labs  Lab 04/02/22 1558  CKTOTAL 122  CKMB 2.3   BNP (last 3 results) No results for input(s): "PROBNP" in the last 8760 hours. HbA1C: No results for input(s): "HGBA1C" in the last 72 hours. CBG: Recent Labs  Lab 04/01/22 1926 04/02/22 1427 04/03/22 0018  GLUCAP 65* 82 133*   Lipid Profile: No results for input(s): "CHOL", "HDL", "LDLCALC", "TRIG", "CHOLHDL", "LDLDIRECT" in the last 72 hours. Thyroid Function Tests: No results for input(s): "TSH", "T4TOTAL", "FREET4", "T3FREE", "THYROIDAB" in the last 72 hours. Anemia Panel: No results for input(s): "VITAMINB12", "FOLATE", "FERRITIN", "TIBC", "IRON", "RETICCTPCT" in the last 72 hours. Sepsis Labs: Recent Labs  Lab 04/01/22 1940 04/02/22 0405 04/02/22 1558  PROCALCITON  --  <0.10  <0.10  LATICACIDVEN 1.8  --  1.1    Recent Results (from the past 240 hour(s))  Resp Panel by RT-PCR (Flu A&B, Covid)     Status: Abnormal   Collection Time: 04/01/22 11:13 PM   Specimen: Nasal Swab  Result Value Ref Range Status   SARS Coronavirus 2 by RT PCR POSITIVE (A) NEGATIVE Final    Comment: (NOTE) SARS-CoV-2 target nucleic acids are DETECTED.  The SARS-CoV-2 RNA is generally detectable in upper respiratory specimens during the acute phase of infection. Positive results are indicative of the presence of the identified virus, but do not rule out bacterial infection or co-infection with other pathogens not detected by the test. Clinical correlation with patient history and other diagnostic information is necessary to determine patient infection status. The expected result is Negative.  Fact Sheet for Patients: https://www.fda.gov/media/152166/download  Fact Sheet for Healthcare Providers: https://www.fda.gov/media/152162/download  This test is not yet   approved or cleared by the [place] and  has been authorized for detection and/or diagnosis of SARS-CoV-2 by FDA under an Emergency Use Authorization (EUA).  This EUA will remain in effect (meaning this test can be used) for the duration of  the COVID-19 declaration under Section 564(b)(1) of the A ct, 21 U.S.C. section 360bbb-3(b)(1), unless the authorization is terminated or revoked sooner.     Influenza A by PCR NEGATIVE NEGATIVE Final   Influenza B by PCR NEGATIVE NEGATIVE Final    Comment: (NOTE) The Xpert Xpress SARS-CoV-2/FLU/RSV plus assay is intended as an aid in the diagnosis of influenza from Nasopharyngeal swab specimens and should not be used as a sole basis for treatment. Nasal washings and aspirates are unacceptable for Xpert Xpress SARS-CoV-2/FLU/RSV testing.  Fact Sheet for Patients: EntrepreneurPulse.com.au  Fact Sheet for Healthcare  Providers: IncredibleEmployment.be  This test is not yet approved or cleared by the [place] FDA and has been authorized for detection and/or diagnosis of SARS-CoV-2 by FDA under an Emergency Use Authorization (EUA). This EUA will remain in effect (meaning this test can be used) for the duration of the COVID-19 declaration under Section 564(b)(1) of the Act, 21 U.S.C. section 360bbb-3(b)(1), unless the authorization is terminated or revoked.  Performed at [facility], [place] [address], [place], [place] [address]   Culture, blood (single)     Status: None (Preliminary result)   Collection Time: 04/01/22 11:13 PM   Specimen: BLOOD RIGHT HAND  Result Value Ref Range Status   Specimen Description   Final    BLOOD RIGHT HAND Performed at [place] [address] [place], [place] [address]    Special Requests   Final    BOTTLES DRAWN AEROBIC AND ANAEROBIC Blood Culture results may not be optimal due to an inadequate volume of blood received in culture bottles Performed at [place] [address] [place], [place] [address]    Culture   Final    NO GROWTH 3 DAYS Performed at [facility], [place] [address] [place], [place] [address]    Report Status PENDING  Incomplete  MRSA Next Gen by PCR, Nasal     Status: None   Collection Time: 04/02/22  1:57 PM   Specimen: Nasal Mucosa; Nasal Swab  Result Value Ref Range Status   MRSA by PCR Next Gen NOT DETECTED NOT DETECTED Final    Comment: (NOTE) The GeneXpert MRSA Assay (FDA approved for NASAL specimens only), is one component of a comprehensive MRSA colonization surveillance program. It is not intended to diagnose MRSA infection nor to guide or monitor treatment for MRSA infections. Test performance is not FDA approved in patients less than 32 years old. Performed at [facility], [place] [address], [place], [place] [address]           Radiology Studies: No results found.      Scheduled Meds:  apixaban  5 mg Oral BID   atenolol  25 mg Oral Daily   atorvastatin  80 mg Oral QODAY   Chlorhexidine Gluconate Cloth  6 each Topical Daily   guaiFENesin  600 mg Oral BID   methylPREDNISolone (SOLU-MEDROL) injection  80 mg Intravenous Daily   molnupiravir EUA  4 capsule Oral BID   mometasone-formoterol  2 puff Inhalation BID   pantoprazole  40 mg Oral QHS   sodium chloride flush  3 mL Intravenous Q12H   venlafaxine XR  75 mg Oral  Q breakfast   Continuous Infusions:  sodium chloride Stopped (04/03/22 1602)   phenylephrine (NEO-SYNEPHRINE) Adult infusion Stopped (04/03/22 0527)     LOS: 3 days    Time spent: 48 minutes spent on chart review, discussion with nursing staff, consultants, updating family and interview/physical exam; more than 50% of that time was spent in counseling and/or coordination of care.     J , DO [facility] Available via Epic secure chat 7am-7pm After these hours, please refer to coverage provider listed on [email or web address] 04/05/2022, 12:28 PM  

## 2022-04-05 NOTE — Care Management Important Message (Signed)
Important Message  Patient Details IM Letter given. Name: Jordan [name] MRN: [id number] Date of Birth: [birth year]   Medicare Important Message Given:  Yes     Kerin Salen 04/05/2022, 10:31 AM

## 2022-04-05 NOTE — Plan of Care (Signed)

## 2022-04-06 DIAGNOSIS — R6510 Systemic inflammatory response syndrome (SIRS) of non-infectious origin without acute organ dysfunction: Secondary | ICD-10-CM

## 2022-04-06 DIAGNOSIS — D649 Anemia, unspecified: Secondary | ICD-10-CM

## 2022-04-06 DIAGNOSIS — I10 Essential (primary) hypertension: Secondary | ICD-10-CM

## 2022-04-06 DIAGNOSIS — F418 Other specified anxiety disorders: Secondary | ICD-10-CM

## 2022-04-06 DIAGNOSIS — J449 Chronic obstructive pulmonary disease, unspecified: Secondary | ICD-10-CM

## 2022-04-06 DIAGNOSIS — I5032 Chronic diastolic (congestive) heart failure: Secondary | ICD-10-CM | POA: Diagnosis not present

## 2022-04-06 DIAGNOSIS — I4819 Other persistent atrial fibrillation: Secondary | ICD-10-CM

## 2022-04-06 DIAGNOSIS — J9611 Chronic respiratory failure with hypoxia: Secondary | ICD-10-CM

## 2022-04-06 DIAGNOSIS — I2510 Atherosclerotic heart disease of native coronary artery without angina pectoris: Secondary | ICD-10-CM

## 2022-04-06 DIAGNOSIS — R410 Disorientation, unspecified: Secondary | ICD-10-CM

## 2022-04-06 DIAGNOSIS — E782 Mixed hyperlipidemia: Secondary | ICD-10-CM

## 2022-04-06 LAB — CBC
HCT: 27.9 % — ABNORMAL LOW (ref 39.0–52.0)
Hemoglobin: 8.6 g/dL — ABNORMAL LOW (ref 13.0–17.0)
MCH: 27.6 pg (ref 26.0–34.0)
MCHC: 30.8 g/dL (ref 30.0–36.0)
MCV: 89.4 fL (ref 80.0–100.0)
Platelets: 130 10*3/uL — ABNORMAL LOW (ref 150–400)
RBC: 3.12 MIL/uL — ABNORMAL LOW (ref 4.22–5.81)
RDW: 16.6 % — ABNORMAL HIGH (ref 11.5–15.5)
WBC: 9.2 10*3/uL (ref 4.0–10.5)
nRBC: 0.2 % (ref 0.0–0.2)

## 2022-04-06 LAB — MAGNESIUM: Magnesium: 1.8 mg/dL (ref 1.7–2.4)

## 2022-04-06 LAB — BASIC METABOLIC PANEL
Anion gap: 5 (ref 5–15)
BUN: 21 mg/dL (ref 8–23)
CO2: 27 mmol/L (ref 22–32)
Calcium: 7.6 mg/dL — ABNORMAL LOW (ref 8.9–10.3)
Chloride: 103 mmol/L (ref 98–111)
Creatinine, Ser: 0.91 mg/dL (ref 0.61–1.24)
GFR, Estimated: >60 mL/min (ref >60)
Glucose, Bld: 138 mg/dL — ABNORMAL HIGH (ref 70–99)
Potassium: 4.2 mmol/L (ref 3.5–5.1)
Sodium: 135 mmol/L (ref 135–145)

## 2022-04-06 NOTE — Plan of Care (Signed)
Patient is awake and stable in bed eating breakfast. Patient denies pain, SOB, or any concerns at this time

## 2022-04-06 NOTE — Progress Notes (Signed)
Occupational Therapy Treatment Patient Details Name: Jordan [name] MRN: [id number] DOB: 12/15/1950 Today's Date: 04/06/2022   History of present illness Pt admitted from home with SOB and confusion and was found to have COVID-19.  Pt with hx o afib, CHF, COPD (on 2L O2 as needed at home, PVD, severe aortic stenosis, s/p TAVR, Bil LE edema, and R TKR 02/07/22   OT comments  Patient was motivated to participate in the therapy session. He required min assist for out of bed functional transfers and mod assist for simulated lower body dressing tasks.  He tolerated ambulating in his room with min guard assist using a RW. He was noted to be with slight generalized weakness, deconditioning, and decreased endurance. Given the aforementioned deficits, he will continue to benefit from further OT services to maximize his independence with self-care tasks. He lives alone and expressed concerns about being able to manage his care needs, once discharged from the hospital. As such, OT anticipates he would benefit from short-term SNF rehab once discharged from the hospital.    Recommendations for follow up therapy are one component of a multi-disciplinary discharge planning process, led by the attending physician.  Recommendations may be updated based on patient status, additional functional criteria and insurance authorization.    Follow Up Recommendations  Skilled nursing-short term rehab (<3 hours/day)    Assistance Recommended at Discharge Frequent or constant Supervision/Assistance  Patient can return home with the following  Assistance with cooking/housework;Assist for transportation;Help with stairs or ramp for entrance;Direct supervision/assist for medications management;A little help with walking and/or transfers   Equipment Recommendations  None recommended by OT       Precautions / Restrictions Precautions Precautions: Knee Precaution Comments: recent R TKA so no pillow under knee while  resting Restrictions Other Position/Activity Restrictions: WBAT, contact/airborne precautions       Mobility Bed Mobility Overal bed mobility: Needs Assistance Bed Mobility: Supine to Sit     Supine to sit: Supervision, HOB elevated Sit to supine: Supervision        Transfers Overall transfer level: Needs assistance Equipment used: Rolling walker (2 wheels) Transfers: Sit to/from Stand Sit to Stand: Min assist, From elevated surface                     ADL either performed or assessed with clinical judgement   ADL Overall ADL's : Needs assistance/impaired Eating/Feeding: Independent   Grooming: Set up;Min guard Grooming Details (indicate cue type and reason): Set-up seated or min guard standing at sink         Upper Body Dressing : Set up;Sitting   Lower Body Dressing: Moderate assistance                                 Cognition Arousal/Alertness: Awake/alert Behavior During Therapy: WFL for tasks assessed/performed, Anxious Overall Cognitive Status: Within Functional Limits for tasks assessed          General Comments: pleasant, able to follow commands                   Pertinent Vitals/ Pain       Pain Assessment Pain Assessment: No/denies pain         Frequency  Min 2X/week        Progress Toward Goals  OT Goals(current goals can now be found in the care plan section)  Progress towards OT goals: Progressing toward goals  Acute Rehab OT Goals Patient Stated Goal: to go to SNF rehab once discharged from the hospital OT Goal Formulation: With patient Time For Goal Achievement: 04/17/22 Potential to Achieve Goals: Good  Plan Discharge plan remains appropriate       AM-PAC OT "6 Clicks" Daily Activity     Outcome Measure   Help from another person eating meals?: None Help from another person taking care of personal grooming?: A Little Help from another person toileting, which includes using toliet, bedpan, or  urinal?: A Little Help from another person bathing (including washing, rinsing, drying)?: A Lot Help from another person to put on and taking off regular upper body clothing?: None Help from another person to put on and taking off regular lower body clothing?: A Lot 6 Click Score: 18    End of Session Equipment Utilized During Treatment: Gait belt;Rolling walker (2 wheels);Oxygen  OT Visit Diagnosis: Unsteadiness on feet (R26.81);Muscle weakness (generalized) (M62.81)   Activity Tolerance Patient tolerated treatment well   Patient Left in bed;with call bell/phone within reach;with bed alarm set   Nurse Communication Mobility status        Time: 7847-8412 OT Time Calculation (min): 17 min  Charges: OT General Charges $OT Visit: 1 Visit OT Treatments $Therapeutic Activity: 8-22 mins    Leota Sauers, OTR/L 04/06/2022, 2:17 PM

## 2022-04-06 NOTE — Plan of Care (Signed)

## 2022-04-06 NOTE — TOC Progression Note (Signed)
Transition of Care [facility]) - Progression Note    Patient Details  Name: [name] MRN: [id number] Date of Birth: 05/21/1951  Transition of Care [facility]) CM/SW [place], [place] Phone Number: 04/06/2022, 9:57 AM  Clinical Narrative:    CSW met with pt to re-discuss PT's recommendation for HHPT. Pt continues to state that he cannot return home and that he "will fall and come right back."  CSW shared potential barriers in going to SNF. CSW shared that there is a possibility that insurance may decline coverage for pt to go. Pt understands potential barriers however, continues to state he does not feel safe going home.    Expected Discharge Plan: Skilled Nursing Facility Barriers to Discharge: SNF Covid, Continued Medical Work up  Expected Discharge Plan and Services Expected Discharge Plan: [place] In-house Referral: NA Discharge Planning Services: CM Consult Post Acute Care Choice: [place] Living arrangements for the past 2 months: Single Family Home                 DME Arranged: N/A DME Agency: NA                   Social Determinants of Health (SDOH) Interventions    Readmission Risk Interventions    04/04/2022   11:02 AM  Readmission Risk Prevention Plan  Transportation Screening Complete  PCP or Specialist Appt within 3-5 Days Complete  HRI or [place] Complete  Social Work Consult for [place] Planning/Counseling Complete  Palliative Care Screening Not Applicable  Medication Review Press photographer) Complete

## 2022-04-06 NOTE — Progress Notes (Signed)
PROGRESS NOTE    JOBE MUTCH  BSW:[id number] DOB: 04-29-1951 DOA: 04/01/2022 PCP: [name], MD    Brief Narrative:   Jordan [name] is a 71 y.o. male with past medical history significant for atrial fibrillation on Eliquis, heart failure with preserved ejection fraction, severe aortic stenosis status post TAVR, COPD, chronic respiratory failure with hypoxia on 2 L O2 via [place] prn, coronary artery disease, hypertension, hyperlipidemia, peripheral vascular disease, chronic venous stasis dermatitis, s/p right TKA 02/07/2022, depression/anxiety presented to hospital with confusion.  Patient was recently discharged home from rehab and has 24/7 nursing care at home.  Apparently was having some hallucinations and confusion and was brought in back to the hospital.  In the ED patient was noted to be hypotensive.  Patient complained of some knee pain since his replacement 8/21. In the ED, patient was hypotensive, tachypneic and tachycardic. Labs was remarkable for creatinine of 1.2.  WBC at 3.8.   Urinalysis negative for UTI.  COVID test was positive.   Portable chest x-ray was negative for focal consolidation, edema, effusion. Patient was given 2 L normal saline , IV vancomycin, cefepime, Flagyl.  Patient was then admitted hospital for further evaluation and treatment.  Assessment & Plan:  Principal Problem:   SIRS (systemic inflammatory response syndrome) (HCC) Active Problems:   Delirium   Persistent atrial fibrillation (HCC)   Essential hypertension   Chronic heart failure with preserved ejection fraction (HCC)   COPD (chronic obstructive pulmonary disease) (HCC)   Chronic respiratory failure with hypoxia (HCC)   HLD (hyperlipidemia)   CAD (coronary artery disease)   Normocytic anemia   Depression with anxiety   Shock, undifferentiated: Resolved Presented with hypotension.  Tested positive for COVID.  History of chronic diastolic heart failure.  Received IV fluids initially.  Was  transferred to ICU for phenylephrine drip briefly.  Was started on steroids and subsequently blood pressure improved.  Blood cultures negative in 4 days.  COVID-19 viral infection Patient presented with tachycardia, tachypnea, and hypotension.  Urinalysis and chest x-ray was negative for infection.  Right knee knee without erythema, surgical site appears well-healed.  Ultrasound right knee did not reveal any acute findings.  COVID-19 PCR positive.  Influenza A/B PCR negative.  We will continue molnupiravir '800mg'$  PO BID x 5 days.  Titrating down on Solu-Medrol daily.  Could change to prednisone from tomorrow.  Hypokalemia: Resolved Latest potassium of 4.2.  Check BMP in a.m.   Delirium: Resolved Fully oriented at this time.  Could be secondary to recent COVID.   Persistent atrial fibrillation (HCC) Rate controlled.  On Eliquis and atenolol.   Chronic heart failure with preserved ejection fraction ([place]) Patient was hypotensive on presentation and was hypovolemic.  EF 55-60% by TTE 07/14/2021.  Currently Lasix on hold.  Intake and output charting Daily weights.   Essential hypertension Continue atenolol.  Chronic respiratory failure with hypoxia ([place]) Patient is on 2 L O2 via [place] as needed at home.     COPD (chronic obstructive pulmonary disease) (HCC) Appears to be compensated at this time.  Continue  Dulera, albuterol and DuoNebs as needed.   Depression with anxiety Continue Effexor and doxepin.   Normocytic anemia Latest hemoglobin at 8.6.  Transfuse for hemoglobin less than 7.  CAD (coronary artery disease) Continue atorvastatin and Eliquis.   HLD (hyperlipidemia) Continue Lipitor  Weakness/deconditioning/debility: Currently resides at home with home health RN/PT.  Recently discharged from SNF.  PT OT initially recommended skilled nursing facility.  Patient  strongly wishes to go to skilled nursing facility.  DVT prophylaxis: Eliquis apixaban (ELIQUIS) tablet 5 mg     Code  Status: DNR Family Communication:  None today.  Previous provider updated family on 04/05/2022   Disposition Plan: Home with home health versus skilled nursing facility.  TOC on board.  Status is: Inpatient Remains inpatient appropriate because: Skilled nursing versus home health.  Consultants:  PCCM, now signed off  Procedures:  None  Antimicrobials:  Vancomycin 10/13 - 10/13 Cefepime 10/13 - 10/13 Metronidazole 10/13 - 10/13   Subjective: Today, patient was seen and examined at bedside.  Patient denies any nausea vomiting fever chills.  He states that he lives by himself and is very scared about going home and wants me to help him out.  Patient wishes to go to skilled nursing facility.  Objective: Vitals:   04/05/22 1332 04/05/22 2046 04/06/22 0633 04/06/22 0751  BP: (!) 151/84 (!) 150/71 (!) 154/83   Pulse: 76 (!) 109 77   Resp: '18 18 20   '$ Temp: 97.8 F (36.6 C) 97.7 F (36.5 C) 97.6 F (36.4 C)   TempSrc: Oral Oral Oral   SpO2:  100% 100% 99%  Weight:      Height:        Intake/Output Summary (Last 24 hours) at 04/06/2022 1348 Last data filed at 04/06/2022 1052 Gross per 24 hour  Intake 1068 ml  Output 1700 ml  Net -632 ml    Filed Weights   04/03/22 2036 04/04/22 0614 04/05/22 0500  Weight: 73.9 kg 76 kg 77.7 kg    Examination:  General:  Average built, not in obvious distress, chronically ill, on nasal cannula oxygen, elderly male HENT:   No scleral pallor or icterus noted. Oral mucosa is moist.  Chest:  Clear breath sounds.  Diminished breath sounds bilaterally. No crackles or wheezes.  CVS: S1 &S2 heard.  Irregularly irregular rhythm abdomen: Soft, nontender, nondistended.  Bowel sounds are heard.   Extremities: No cyanosis, clubbing or edema.  Peripheral pulses are palpable.  Right knee with mild edema.  Chronic venous stasis changes over the lower extremities with trace peripheral edema Psych: Alert, awake and oriented, normal mood CNS:  No  cranial nerve deficits.  Power equal in all extremities.   Skin: Warm and dry.  No rashes noted.   Data Reviewed: I have personally reviewed following labs and imaging studies  CBC: Recent Labs  Lab 04/01/22 1940 04/02/22 0405 04/02/22 1558 04/03/22 0523 04/06/22 0325  WBC 3.8* 6.7 6.8 1.7* 9.2  NEUTROABS 2.4  --   --   --   --   HGB 9.9* 10.2* 9.8* 8.9* 8.6*  HCT 31.7* 32.9* 31.7* 28.9* 27.9*  MCV 86.4 88.2 87.6 87.3 89.4  PLT 172 161 198 182 130*    Basic Metabolic Panel: Recent Labs  Lab 04/02/22 0405 04/03/22 0523 04/04/22 0310 04/05/22 0330 04/06/22 0325  NA 139 139 133* 135 135  K 3.6 3.0* 4.4 4.4 4.2  CL 103 106 105 105 103  CO2 '28 27 24 26 27  '$ GLUCOSE 86 185* 162* 130* 138*  BUN 25* 24* 32* 23 21  CREATININE 1.15 1.10 1.37* 0.97 0.91  CALCIUM 7.9* 7.9* 7.7* 7.7* 7.6*  MG  --  1.9 2.2  --  1.8    GFR: Estimated Creatinine Clearance: 81.8 mL/min (by C-G formula based on SCr of 0.91 mg/dL). Liver Function Tests: Recent Labs  Lab 04/01/22 1940 04/02/22 0405 04/03/22 0523  AST 42* 45*  41  ALT '25 27 22  '$ ALKPHOS 103 101 80  BILITOT 1.3* 1.2 1.3*  PROT 6.0* 5.8* 5.2*  ALBUMIN 3.2* 2.9* 2.7*    No results for input(s): "LIPASE", "AMYLASE" in the last 168 hours. Recent Labs  Lab 04/01/22 1940  AMMONIA 18    Coagulation Profile: No results for input(s): "INR", "PROTIME" in the last 168 hours. Cardiac Enzymes: Recent Labs  Lab 04/02/22 1558  CKTOTAL 122  CKMB 2.3    BNP (last 3 results) No results for input(s): "PROBNP" in the last 8760 hours. HbA1C: No results for input(s): "HGBA1C" in the last 72 hours. CBG: Recent Labs  Lab 04/01/22 1926 04/02/22 1427 04/03/22 0018  GLUCAP 65* 82 133*    Lipid Profile: No results for input(s): "CHOL", "HDL", "LDLCALC", "TRIG", "CHOLHDL", "LDLDIRECT" in the last 72 hours. Thyroid Function Tests: No results for input(s): "TSH", "T4TOTAL", "FREET4", "T3FREE", "THYROIDAB" in the last 72  hours. Anemia Panel: No results for input(s): "VITAMINB12", "FOLATE", "FERRITIN", "TIBC", "IRON", "RETICCTPCT" in the last 72 hours. Sepsis Labs: Recent Labs  Lab 04/01/22 1940 04/02/22 0405 04/02/22 1558  PROCALCITON  --  <0.10 <0.10  LATICACIDVEN 1.8  --  1.1     Recent Results (from the past 240 hour(s))  Resp Panel by RT-PCR (Flu A&B, Covid)     Status: Abnormal   Collection Time: 04/01/22 11:13 PM   Specimen: Nasal Swab  Result Value Ref Range Status   SARS Coronavirus 2 by RT PCR POSITIVE (A) NEGATIVE Final    Comment: (NOTE) SARS-CoV-2 target nucleic acids are DETECTED.  The SARS-CoV-2 RNA is generally detectable in upper respiratory specimens during the acute phase of infection. Positive results are indicative of the presence of the identified virus, but do not rule out bacterial infection or co-infection with other pathogens not detected by the test. Clinical correlation with patient history and other diagnostic information is necessary to determine patient infection status. The expected result is Negative.  Fact Sheet for Patients: EntrepreneurPulse.com.au  Fact Sheet for Healthcare Providers: IncredibleEmployment.be  This test is not yet approved or cleared by the [place] FDA and  has been authorized for detection and/or diagnosis of SARS-CoV-2 by FDA under an Emergency Use Authorization (EUA).  This EUA will remain in effect (meaning this test can be used) for the duration of  the COVID-19 declaration under Section 564(b)(1) of the A ct, 21 U.S.C. section 360bbb-3(b)(1), unless the authorization is terminated or revoked sooner.     Influenza A by PCR NEGATIVE NEGATIVE Final   Influenza B by PCR NEGATIVE NEGATIVE Final    Comment: (NOTE) The Xpert Xpress SARS-CoV-2/FLU/RSV plus assay is intended as an aid in the diagnosis of influenza from Nasopharyngeal swab specimens and should not be used as a sole basis for  treatment. Nasal washings and aspirates are unacceptable for Xpert Xpress SARS-CoV-2/FLU/RSV testing.  Fact Sheet for Patients: EntrepreneurPulse.com.au  Fact Sheet for Healthcare Providers: IncredibleEmployment.be  This test is not yet approved or cleared by the [place] FDA and has been authorized for detection and/or diagnosis of SARS-CoV-2 by FDA under an Emergency Use Authorization (EUA). This EUA will remain in effect (meaning this test can be used) for the duration of the COVID-19 declaration under Section 564(b)(1) of the Act, 21 U.S.C. section 360bbb-3(b)(1), unless the authorization is terminated or revoked.  Performed at [facility], [place] [address] [place], [place] [address]   Culture, blood (single)     Status: None (Preliminary result)  Collection Time: 04/01/22 11:13 PM   Specimen: BLOOD RIGHT HAND  Result Value Ref Range Status   Specimen Description   Final    BLOOD RIGHT HAND Performed at [place] [address], [place], [place] [address]    Special Requests   Final    BOTTLES DRAWN AEROBIC AND ANAEROBIC Blood Culture results may not be optimal due to an inadequate volume of blood received in culture bottles Performed at [facility] [address] [place], [place] [address]    Culture   Final    NO GROWTH 4 DAYS Performed at [facility], [place] [address], [place], [place] [address]    Report Status PENDING  Incomplete  MRSA Next Gen by PCR, Nasal     Status: None   Collection Time: 04/02/22  1:57 PM   Specimen: Nasal Mucosa; Nasal Swab  Result Value Ref Range Status   MRSA by PCR Next Gen NOT DETECTED NOT DETECTED Final    Comment: (NOTE) The GeneXpert MRSA Assay (FDA approved for NASAL specimens only), is one component of a comprehensive MRSA colonization surveillance program. It is not intended to diagnose MRSA infection nor to guide or  monitor treatment for MRSA infections. Test performance is not FDA approved in patients less than 71 years old. Performed at [facility], [place] [address] [place], [place] [address]       Radiology Studies: No results found.   Scheduled Meds:  apixaban  5 mg Oral BID   atenolol  25 mg Oral Daily   atorvastatin  80 mg Oral QODAY   Chlorhexidine Gluconate Cloth  6 each Topical Daily   guaiFENesin  600 mg Oral BID   methylPREDNISolone (SOLU-MEDROL) injection  40 mg Intravenous Daily   molnupiravir EUA  4 capsule Oral BID   mometasone-formoterol  2 puff Inhalation BID   pantoprazole  40 mg Oral QHS   sodium chloride flush  3 mL Intravenous Q12H   venlafaxine XR  75 mg Oral Q breakfast   Continuous Infusions:  sodium chloride Stopped (04/03/22 1602)   phenylephrine (NEO-SYNEPHRINE) Adult infusion Stopped (04/03/22 0527)     LOS: 4 days    Flora Lipps, MD [facility] Available via Epic secure chat 7am-7pm After these hours, please refer to coverage provider listed on [email or web address] 04/06/2022, 1:48 PM

## 2022-04-07 DIAGNOSIS — F418 Other specified anxiety disorders: Secondary | ICD-10-CM

## 2022-04-07 DIAGNOSIS — E782 Mixed hyperlipidemia: Secondary | ICD-10-CM

## 2022-04-07 DIAGNOSIS — I5032 Chronic diastolic (congestive) heart failure: Secondary | ICD-10-CM

## 2022-04-07 DIAGNOSIS — R6510 Systemic inflammatory response syndrome (SIRS) of non-infectious origin without acute organ dysfunction: Secondary | ICD-10-CM | POA: Diagnosis not present

## 2022-04-07 DIAGNOSIS — I2510 Atherosclerotic heart disease of native coronary artery without angina pectoris: Secondary | ICD-10-CM | POA: Diagnosis not present

## 2022-04-07 DIAGNOSIS — J9611 Chronic respiratory failure with hypoxia: Secondary | ICD-10-CM | POA: Diagnosis not present

## 2022-04-07 LAB — BASIC METABOLIC PANEL
Anion gap: 3 — ABNORMAL LOW (ref 5–15)
BUN: 23 mg/dL (ref 8–23)
CO2: 29 mmol/L (ref 22–32)
Calcium: 7.2 mg/dL — ABNORMAL LOW (ref 8.9–10.3)
Chloride: 102 mmol/L (ref 98–111)
Creatinine, Ser: 1 mg/dL (ref 0.61–1.24)
GFR, Estimated: >60 mL/min (ref >60)
Glucose, Bld: 89 mg/dL (ref 70–99)
Potassium: 3.7 mmol/L (ref 3.5–5.1)
Sodium: 134 mmol/L — ABNORMAL LOW (ref 135–145)

## 2022-04-07 LAB — CULTURE, BLOOD (SINGLE): Culture: NO GROWTH

## 2022-04-07 MED ORDER — PREDNISONE 20 MG PO TABS
30.0000 mg | ORAL_TABLET | Freq: Every day | ORAL | Status: DC
  Administered 2022-04-08 – 2022-04-12 (×5): 30 mg via ORAL
  Filled 2022-04-07 (×5): qty 1

## 2022-04-07 NOTE — TOC Progression Note (Signed)
Transition of Care [facility]) - Progression Note    Patient Details  Name: LOTUS GOVER MRN: [id number] Date of Birth: 07/10/1950  [facility] CM/SW [place], [place] Phone Number: 04/07/2022, 10:18 AM  Clinical Narrative:    CSW spoke with pt and encouraged him to reach out to [facility] to provide all necessary financial information as to prevent any delay in discharge once he has completed his COVID isolation. Insurance authorization will need to be requested closer to the end of isolation period.    Expected Discharge Plan: Skilled Nursing Facility Barriers to Discharge: SNF Covid, Continued Medical Work up  Expected Discharge Plan and Services Expected Discharge Plan: [place] In-house Referral: NA Discharge Planning Services: CM Consult Post Acute Care Choice: [place] Living arrangements for the past 2 months: Single Family Home                 DME Arranged: N/A DME Agency: NA                   Social Determinants of Health (SDOH) Interventions    Readmission Risk Interventions    04/04/2022   11:02 AM  Readmission Risk Prevention Plan  Transportation Screening Complete  PCP or Specialist Appt within 3-5 Days Complete  HRI or [place] Complete  Social Work Consult for [place] Planning/Counseling Complete  Palliative Care Screening Not Applicable  Medication Review Press photographer) Complete

## 2022-04-07 NOTE — Progress Notes (Signed)
Physical Therapy Treatment Patient Details Name: Jordan [name] MRN: [id number] DOB: [birth year] Today's Date: 04/07/2022   History of Present Illness Pt admitted from home with SOB and confusion and was found to have COVID-19.  Pt with hx o afib, CHF, COPD (on 2L O2 as needed at home, PVD, severe aortic stenosis, s/p TAVR, Bil LE edema, and R TKR 02/07/22    PT Comments    Patient highly motivated to participate in therapy. Pt transferred to [facility] at start of session to manage bowel incontinence, aggreeable to ambulate short bout in hallway after. Pt remained on 2L/min with gait and SpO2 98-100% with activity, cues needs for posture throughout and seated rest break at ~20' due to fatigue. EOS pt declined sitting up in recliner. Return to bed and positioned in chair position and educated on importance of upright sitting for lung clearance. Will continue to progress as able in acute setting.    Recommendations for follow up therapy are one component of a multi-disciplinary discharge planning process, led by the attending physician.  Recommendations may be updated based on patient status, additional functional criteria and insurance authorization.  Follow Up Recommendations  Skilled nursing-short term rehab (<3 hours/day) Can patient physically be transported by private vehicle: Yes   Assistance Recommended at Discharge Frequent or constant Supervision/Assistance  Patient can return home with the following A little help with walking and/or transfers;A little help with bathing/dressing/bathroom;Assistance with cooking/housework;Assist for transportation;Help with stairs or ramp for entrance   Equipment Recommendations  None recommended by PT    Recommendations for Other Services       Precautions / Restrictions Precautions Precautions: Knee Precaution Comments: recent R TKA so no pillow under knee while resting Restrictions Other Position/Activity Restrictions: WBAT, contact/airborne  precautions     Mobility  Bed Mobility Overal bed mobility: Needs Assistance Bed Mobility: Supine to Sit, Sit to Supine     Supine to sit: Supervision, HOB elevated Sit to supine: Supervision, HOB elevated   General bed mobility comments: supervision for safety, pt taking extra time and using bed rail to pivot to EOB and to return to supine EOS.    Transfers Overall transfer level: Needs assistance Equipment used: Rolling walker (2 wheels) Transfers: Sit to/from Stand, Bed to chair/wheelchair/BSC Sit to Stand: Min assist, From elevated surface   Step pivot transfers: Min assist       General transfer comment: cues for hand placement with power up from EOB, BSC, and recliner.    Ambulation/Gait Ambulation/Gait assistance: Min assist Gait Distance (Feet): 40 Feet (2x20) Assistive device: Rolling walker (2 wheels) Gait Pattern/deviations: Step-through pattern, Decreased stride length, Shuffle, Trunk flexed, Narrow base of support Gait velocity: decr     General Gait Details: trunk flexed and pt taking short shuffled steps. cues required to improve posture and proximitiy to RW throughout. SpO2 remain 98-100% on 2L/min during gait. HR in 100's. seated rest break provided at 20' due to fatigue.   Stairs             Wheelchair Mobility    Modified Rankin (Stroke Patients Only)       Balance Overall balance assessment: Needs assistance Sitting-balance support: Feet supported Sitting balance-Leahy Scale: Fair     Standing balance support: Bilateral upper extremity supported Standing balance-Leahy Scale: Poor                              Cognition Arousal/Alertness: Awake/alert Behavior During Therapy:  WFL for tasks assessed/performed, Anxious Overall Cognitive Status: Within Functional Limits for tasks assessed                                 General Comments: pleasant, able to follow commands        Exercises      General  Comments        Pertinent Vitals/Pain      Home Living                          Prior Function            PT Goals (current goals can now be found in the care plan section) Acute Rehab PT Goals PT Goal Formulation: With patient Time For Goal Achievement: 04/17/22 Potential to Achieve Goals: Fair Progress towards PT goals: Progressing toward goals    Frequency    Min 3X/week      PT Plan Current plan remains appropriate    Co-evaluation              AM-PAC PT "6 Clicks" Mobility   Outcome Measure  Help needed turning from your back to your side while in a flat bed without using bedrails?: A Little Help needed moving from lying on your back to sitting on the side of a flat bed without using bedrails?: A Little Help needed moving to and from a bed to a chair (including a wheelchair)?: A Little Help needed standing up from a chair using your arms (e.g., wheelchair or bedside chair)?: A Little Help needed to walk in hospital room?: A Little Help needed climbing 3-5 steps with a railing? : Total 6 Click Score: 16    End of Session Equipment Utilized During Treatment: Gait belt;Oxygen Activity Tolerance: Patient tolerated treatment well Patient left: in bed;with call bell/phone within reach;with bed alarm set (chair position) Nurse Communication: Mobility status PT Visit Diagnosis: Muscle weakness (generalized) (M62.81);Difficulty in walking, not elsewhere classified (R26.2)     Time: 1455-1530 PT Time Calculation (min) (ACUTE ONLY): 35 min  Charges:  $Gait Training: 8-22 mins $Therapeutic Activity: 8-22 mins                     Verner Mould, DPT Acute Rehabilitation Services Office [phone]  04/07/22 4:23 PM

## 2022-04-07 NOTE — Progress Notes (Addendum)
PROGRESS NOTE    Jordan [name]  [name] DOB: 04-04-1951 DOA: 04/01/2022 PCP: [name], MD    Brief Narrative:   Jordan [name] is a 71 y.o. male with past medical history significant for atrial fibrillation on Eliquis, heart failure with preserved ejection fraction, severe aortic stenosis status post TAVR, COPD, chronic respiratory failure with hypoxia on 2 L O2 via [place] prn, coronary artery disease, hypertension, hyperlipidemia, peripheral vascular disease, chronic venous stasis dermatitis, s/p right TKA 02/07/2022, depression/anxiety presented to hospital with confusion.  Patient was recently discharged home from rehab and has 24/7 nursing care at home.  Apparently was having some hallucinations and confusion and was brought in back to the hospital.  In the ED patient was noted to be hypotensive.  Patient complained of some knee pain since his replacement 8/21. In the ED, patient was hypotensive, tachypneic and tachycardic. Labs was remarkable for creatinine of 1.2.  WBC at 3.8.   Urinalysis negative for UTI.  COVID test was positive.   Portable chest x-ray was negative for focal consolidation, edema, effusion. Patient was given 2 L normal saline , IV vancomycin, cefepime, Flagyl.  Patient was then admitted hospital for further evaluation and treatment.  Assessment & Plan:  Principal Problem:   SIRS (systemic inflammatory response syndrome) (HCC) Active Problems:   Delirium   Persistent atrial fibrillation (HCC)   Essential hypertension   Chronic heart failure with preserved ejection fraction (HCC)   COPD (chronic obstructive pulmonary disease) (HCC)   Chronic respiratory failure with hypoxia (HCC)   HLD (hyperlipidemia)   CAD (coronary artery disease)   Normocytic anemia   Depression with anxiety   Shock, undifferentiated: Resolved Presented with hypotension.  Tested positive for COVID.  History of chronic diastolic heart failure.  Received IV fluids initially.  Was  transferred to ICU for phenylephrine drip briefly.  Was started on steroids.  Blood cultures negative in 5 days.  Blood pressure has improved at this time.  COVID-19 viral infection Patient presented with tachycardia, tachypnea, and hypotension.  Urinalysis and chest x-ray was negative for infection.  Right knee knee without erythema, surgical site appears well-healed.  Ultrasound right knee did not reveal any acute findings.  COVID-19 PCR positive.  Completed molnupiravir '800mg'$  PO BID x 5 days.  Solu-Medrol has been changed to oral prednisone at this time.  Hypokalemia: Resolved, Latest potassium of 3.7.   Delirium: Resolved   Persistent atrial fibrillation (HCC) Rate controlled.  On Eliquis and atenolol.   Chronic heart failure with preserved ejection fraction ([place]) Patient was hypotensive on presentation and was hypovolemic.  EF 55-60% by TTE 07/14/2021.  Currently Lasix on hold.  Intake and output charting Daily weights.  Patient is negative balance for 3682 mL   Essential hypertension Continue atenolol.  Chronic respiratory failure with hypoxia ([place]) Patient is on 2 L O2 via [place] as needed at home.  Currently on 1.5 L of nasal cannula oxygen.   COPD (chronic obstructive pulmonary disease) (HCC) Appears to be compensated at this time.  Continue  Dulera, albuterol and DuoNebs as needed.   Depression with anxiety Continue Effexor and doxepin.   Normocytic anemia Latest hemoglobin at 8.6.  Transfuse for hemoglobin less than 7.  Check CBC in AM.  CAD (coronary artery disease) Continue atorvastatin and Eliquis.   HLD (hyperlipidemia) Continue Lipitor  Weakness/deconditioning/debility: Currently resides at home with home health RN/PT. physical therapy has recommended skilled nursing facility placement at this time.  DVT prophylaxis: Eliquis apixaban (ELIQUIS) tablet  5 mg     Code Status: DNR  Family Communication:  None at bedside.  Disposition Plan:  Awaiting for skilled  nursing facility placement.  Patient has positive COVID test and will need to be off isolation.  Status is: Inpatient  Remains inpatient appropriate because: Awaiting for skilled nursing facility.    Consultants:  PCCM, now signed off  Procedures:  None  Antimicrobials:  None currently.  Patient received vancomycin cefepime and [place] initially.  Subjective: Today, patient was seen and examined at bedside.  Patient denies any nausea vomiting fever chills or rigor.  Denies increasing cough shortness of breath or chest pain.    Objective: Vitals:   04/06/22 1955 04/07/22 0449 04/07/22 0521 04/07/22 0804  BP: 114/72 (!) 122/96    Pulse: 76 85    Resp: 18 18    Temp: 98.2 F (36.8 C) 98.3 F (36.8 C)    TempSrc: Oral Oral    SpO2: 95% 99%  98%  Weight:   75.2 kg   Height:        Intake/Output Summary (Last 24 hours) at 04/07/2022 1145 Last data filed at 04/07/2022 0503 Gross per 24 hour  Intake 340 ml  Output 1300 ml  Net -960 ml    Filed Weights   04/04/22 0614 04/05/22 0500 04/07/22 0521  Weight: 76 kg 77.7 kg 75.2 kg   Body mass index is 21.87 kg/m.   Examination:  General:  Average built, not in obvious distress, chronically ill, on nasal cannula oxygen HENT:   No scleral pallor or icterus noted. Oral mucosa is moist.  Chest:   Diminished breath sounds bilaterally. No crackles or wheezes.  CVS: S1 &S2 heard. No murmur.  Regular rate and rhythm. Abdomen: Soft, nontender, nondistended.  Bowel sounds are heard.   Extremities: No cyanosis, clubbing with trace pedal edema, peripheral pulses are palpable.  Evidence of chronic venous insufficiency noted. Psych: Alert, awake and oriented, normal mood CNS:  No cranial nerve deficits.  Power equal in all extremities.   Skin: Warm and dry.  No rashes noted.   Data Reviewed: I have personally reviewed following labs and imaging studies  CBC: Recent Labs  Lab 04/01/22 1940 04/02/22 0405 04/02/22 1558  04/03/22 0523 04/06/22 0325  WBC 3.8* 6.7 6.8 1.7* 9.2  NEUTROABS 2.4  --   --   --   --   HGB 9.9* 10.2* 9.8* 8.9* 8.6*  HCT 31.7* 32.9* 31.7* 28.9* 27.9*  MCV 86.4 88.2 87.6 87.3 89.4  PLT 172 161 198 182 130*    Basic Metabolic Panel: Recent Labs  Lab 04/03/22 0523 04/04/22 0310 04/05/22 0330 04/06/22 0325 04/07/22 0426  NA 139 133* 135 135 134*  K 3.0* 4.4 4.4 4.2 3.7  CL 106 105 105 103 102  CO2 '27 24 26 27 29  '$ GLUCOSE 185* 162* 130* 138* 89  BUN 24* 32* '23 21 23  '$ CREATININE 1.10 1.37* 0.97 0.91 1.00  CALCIUM 7.9* 7.7* 7.7* 7.6* 7.2*  MG 1.9 2.2  --  1.8  --     GFR: Estimated Creatinine Clearance: 72.1 mL/min (by C-G formula based on SCr of 1 mg/dL). Liver Function Tests: Recent Labs  Lab 04/01/22 1940 04/02/22 0405 04/03/22 0523  AST 42* 45* 41  ALT '25 27 22  '$ ALKPHOS 103 101 80  BILITOT 1.3* 1.2 1.3*  PROT 6.0* 5.8* 5.2*  ALBUMIN 3.2* 2.9* 2.7*    No results for input(s): "LIPASE", "AMYLASE" in the last 168 hours. Recent  Labs  Lab 04/01/22 1940  AMMONIA 18    Coagulation Profile: No results for input(s): "INR", "PROTIME" in the last 168 hours. Cardiac Enzymes: Recent Labs  Lab 04/02/22 1558  CKTOTAL 122  CKMB 2.3    BNP (last 3 results) No results for input(s): "PROBNP" in the last 8760 hours. HbA1C: No results for input(s): "HGBA1C" in the last 72 hours. CBG: Recent Labs  Lab 04/01/22 1926 04/02/22 1427 04/03/22 0018  GLUCAP 65* 82 133*    Lipid Profile: No results for input(s): "CHOL", "HDL", "LDLCALC", "TRIG", "CHOLHDL", "LDLDIRECT" in the last 72 hours. Thyroid Function Tests: No results for input(s): "TSH", "T4TOTAL", "FREET4", "T3FREE", "THYROIDAB" in the last 72 hours. Anemia Panel: No results for input(s): "VITAMINB12", "FOLATE", "FERRITIN", "TIBC", "IRON", "RETICCTPCT" in the last 72 hours. Sepsis Labs: Recent Labs  Lab 04/01/22 1940 04/02/22 0405 04/02/22 1558  PROCALCITON  --  <0.10 <0.10  LATICACIDVEN 1.8  --   1.1     Recent Results (from the past 240 hour(s))  Resp Panel by RT-PCR (Flu A&B, Covid)     Status: Abnormal   Collection Time: 04/01/22 11:13 PM   Specimen: Nasal Swab  Result Value Ref Range Status   SARS Coronavirus 2 by RT PCR POSITIVE (A) NEGATIVE Final    Comment: (NOTE) SARS-CoV-2 target nucleic acids are DETECTED.  The SARS-CoV-2 RNA is generally detectable in upper respiratory specimens during the acute phase of infection. Positive results are indicative of the presence of the identified virus, but do not rule out bacterial infection or co-infection with other pathogens not detected by the test. Clinical correlation with patient history and other diagnostic information is necessary to determine patient infection status. The expected result is Negative.  Fact Sheet for Patients: EntrepreneurPulse.com.au  Fact Sheet for Healthcare Providers: IncredibleEmployment.be  This test is not yet approved or cleared by the [place] FDA and  has been authorized for detection and/or diagnosis of SARS-CoV-2 by FDA under an Emergency Use Authorization (EUA).  This EUA will remain in effect (meaning this test can be used) for the duration of  the COVID-19 declaration under Section 564(b)(1) of the A ct, 21 U.S.C. section 360bbb-3(b)(1), unless the authorization is terminated or revoked sooner.     Influenza A by PCR NEGATIVE NEGATIVE Final   Influenza B by PCR NEGATIVE NEGATIVE Final    Comment: (NOTE) The Xpert Xpress SARS-CoV-2/FLU/RSV plus assay is intended as an aid in the diagnosis of influenza from Nasopharyngeal swab specimens and should not be used as a sole basis for treatment. Nasal washings and aspirates are unacceptable for Xpert Xpress SARS-CoV-2/FLU/RSV testing.  Fact Sheet for Patients: EntrepreneurPulse.com.au  Fact Sheet for Healthcare Providers: IncredibleEmployment.be  This test  is not yet approved or cleared by the [place] FDA and has been authorized for detection and/or diagnosis of SARS-CoV-2 by FDA under an Emergency Use Authorization (EUA). This EUA will remain in effect (meaning this test can be used) for the duration of the COVID-19 declaration under Section 564(b)(1) of the Act, 21 U.S.C. section 360bbb-3(b)(1), unless the authorization is terminated or revoked.  Performed at [facility], [place] [address], [place], [place] [address]   Culture, blood (single)     Status: None   Collection Time: 04/01/22 11:13 PM   Specimen: BLOOD RIGHT HAND  Result Value Ref Range Status   Specimen Description   Final    BLOOD RIGHT HAND Performed at [facility] [address] [place], [place] [address]  Special Requests   Final    BOTTLES DRAWN AEROBIC AND ANAEROBIC Blood Culture results may not be optimal due to an inadequate volume of blood received in culture bottles Performed at [facility] [address], [place], [place] [address]    Culture   Final    NO GROWTH 5 DAYS Performed at [facility], [place] [address], [place], [place] [address]    Report Status 04/07/2022 FINAL  Final  MRSA Next Gen by PCR, Nasal     Status: None   Collection Time: 04/02/22  1:57 PM   Specimen: Nasal Mucosa; Nasal Swab  Result Value Ref Range Status   MRSA by PCR Next Gen NOT DETECTED NOT DETECTED Final    Comment: (NOTE) The GeneXpert MRSA Assay (FDA approved for NASAL specimens only), is one component of a comprehensive MRSA colonization surveillance program. It is not intended to diagnose MRSA infection nor to guide or monitor treatment for MRSA infections. Test performance is not FDA approved in patients less than 60 years old. Performed at [facility], [place] [address], [place],  [address]       Radiology Studies: No results found.   Scheduled Meds:  apixaban  5 mg  Oral BID   atenolol  25 mg Oral Daily   atorvastatin  80 mg Oral QODAY   Chlorhexidine Gluconate Cloth  6 each Topical Daily   guaiFENesin  600 mg Oral BID   methylPREDNISolone (SOLU-MEDROL) injection  40 mg Intravenous Daily   mometasone-formoterol  2 puff Inhalation BID   pantoprazole  40 mg Oral QHS   sodium chloride flush  3 mL Intravenous Q12H   venlafaxine XR  75 mg Oral Q breakfast   Continuous Infusions:  sodium chloride Stopped (04/03/22 1602)   phenylephrine (NEO-SYNEPHRINE) Adult infusion Stopped (04/03/22 0527)     LOS: 5 days    Flora Lipps, MD [facility] Available via Epic secure chat 7am-7pm After these hours, please refer to coverage provider listed on [email or web address] 04/07/2022, 11:45 AM

## 2022-04-07 NOTE — Plan of Care (Signed)

## 2022-04-08 DIAGNOSIS — J9611 Chronic respiratory failure with hypoxia: Secondary | ICD-10-CM | POA: Diagnosis not present

## 2022-04-08 DIAGNOSIS — I2510 Atherosclerotic heart disease of native coronary artery without angina pectoris: Secondary | ICD-10-CM | POA: Diagnosis not present

## 2022-04-08 DIAGNOSIS — R6510 Systemic inflammatory response syndrome (SIRS) of non-infectious origin without acute organ dysfunction: Secondary | ICD-10-CM | POA: Diagnosis not present

## 2022-04-08 DIAGNOSIS — I5032 Chronic diastolic (congestive) heart failure: Secondary | ICD-10-CM | POA: Diagnosis not present

## 2022-04-08 NOTE — Care Management Important Message (Signed)
Important Message  Patient Details IM Letter placed in Patients room. Name: Jordan [name] MRN: [id number] Date of Birth: 02-Aug-1950   Medicare Important Message Given:  Yes     Kerin Salen 04/08/2022, 10:26 AM

## 2022-04-08 NOTE — Progress Notes (Signed)
PROGRESS NOTE    Jordan [name]  ZHG:[id number] DOB: 1950-10-29 DOA: 04/01/2022 PCP: [name], MD    Brief Narrative:   Jordan [name] is a 71 y.o. 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  Patient was recently discharged home from rehab and has 24/7 nursing care at home.  Apparently was having some hallucinations and confusion and was brought in back to the hospital.  In the ED patient was noted to be hypotensive.  Patient complained of some knee pain since his replacement 8/21. In the ED, patient was hypotensive, tachypneic and tachycardic. Labs was remarkable for creatinine of 1.2.  WBC at 3.8.   Urinalysis negative for UTI.  COVID test was positive.   Portable chest x-ray was negative for focal consolidation, edema, effusion. Patient was given 2 L normal saline , IV vancomycin, cefepime, Flagyl.  Patient was then admitted hospital for further evaluation and treatment.  Assessment & Plan:  Principal Problem:   SIRS (systemic inflammatory response syndrome) (HCC) Active Problems:   Delirium   Persistent atrial fibrillation (HCC)   Essential hypertension   Chronic heart failure with preserved ejection fraction (HCC)   COPD (chronic obstructive pulmonary disease) (HCC)   Chronic respiratory failure with hypoxia (HCC)   HLD (hyperlipidemia)   CAD (coronary artery disease)   Normocytic anemia   Depression with anxiety   Shock, undifferentiated: Resolved Presented with hypotension.  Tested positive for COVID.  History of chronic diastolic heart failure.  Received IV fluids initially.  Was  transferred to ICU for phenylephrine drip briefly.  Was started on steroids.  Blood cultures negative in 5 days.  Blood pressure has improved at this time.  COVID-19 viral infection Patient presented with tachycardia, tachypnea, and hypotension.  Urinalysis and chest x-ray was negative for infection.  Right knee knee without erythema, surgical site appears well-healed.  Ultrasound right knee did not reveal any acute findings.  COVID-19 PCR positive.  Completed molnupiravir '800mg'$  PO BID x 5 days.  Solu-Medrol has been changed to oral prednisone at this time.  Hypokalemia: Resolved, Latest potassium of 3.7.   Delirium: Resolved   Persistent atrial fibrillation (HCC) Rate controlled.  On Eliquis and atenolol.   Chronic heart failure with preserved ejection fraction ([place]) Patient was hypotensive on presentation and was hypovolemic.  EF 55-60% by TTE 07/14/2021.  Currently Lasix on hold.  Intake and output charting Daily weights.  Patient is negative balance for 3682 mL   Essential hypertension Continue atenolol.  Chronic respiratory failure with hypoxia ([place]) Patient is on 2 L O2 via [place] as needed at home.  Currently on 1.5 L of nasal cannula oxygen.   COPD (chronic obstructive pulmonary disease) (HCC) Appears to be compensated at this time.  Continue  Dulera, albuterol and DuoNebs as needed.   Depression with anxiety Continue Effexor and doxepin.   Normocytic anemia Latest hemoglobin at 8.6.  Transfuse for hemoglobin less than 7.  Check CBC in AM.  CAD (coronary artery disease) Continue atorvastatin and Eliquis.   HLD (hyperlipidemia) Continue Lipitor  Weakness/deconditioning/debility: Currently resides at home with home health RN/PT. physical therapy has recommended skilled nursing facility placement at this time.  DVT prophylaxis: Eliquis apixaban (ELIQUIS) tablet  5 mg     Code Status: DNR  Family Communication:  None at bedside.  Disposition Plan:  Awaiting for skilled  nursing facility placement.  Patient has positive COVID test and will need to be off isolation.  Status is: Inpatient  Remains inpatient appropriate because: Awaiting for skilled nursing facility.    Consultants:  PCCM, now signed off  Procedures:  None  Antimicrobials:  None currently.  Patient received vancomycin cefepime and [place] initially.  Subjective: Today, patient was seen and examined at bedside.  Patient denies any nausea vomiting fever chills or rigor.  Denies increasing cough shortness of breath or chest pain.    Objective: Vitals:   04/07/22 1928 04/07/22 1947 04/08/22 0651 04/08/22 1240  BP: 128/75  123/82 109/63  Pulse: 87  95 78  Resp: '20  18 20  '$ Temp: 98.3 F (36.8 C)  97.7 F (36.5 C) 98.2 F (36.8 C)  TempSrc: Oral  Oral Oral  SpO2: 98% 97% 98% 100%  Weight:      Height:        Intake/Output Summary (Last 24 hours) at 04/08/2022 1347 Last data filed at 04/08/2022 0653 Gross per 24 hour  Intake 960 ml  Output 3100 ml  Net -2140 ml    Filed Weights   04/04/22 0614 04/05/22 0500 04/07/22 0521  Weight: 76 kg 77.7 kg 75.2 kg   Body mass index is 21.87 kg/m.   Examination:  General:  Average built, not in obvious distress, chronically ill, on nasal cannula oxygen HENT:   No scleral pallor or icterus noted. Oral mucosa is moist.  Chest:   Diminished breath sounds bilaterally. No crackles or wheezes.  CVS: S1 &S2 heard. No murmur.  Regular rate and rhythm. Abdomen: Soft, nontender, nondistended.  Bowel sounds are heard.   Extremities: No cyanosis, clubbing with trace pedal edema, peripheral pulses are palpable.  Evidence of chronic venous insufficiency noted. Psych: Alert, awake and oriented, normal mood CNS:  No cranial nerve deficits.  Power equal in all extremities.   Skin: Warm and dry.  No rashes noted.   Data Reviewed: I have personally reviewed following labs and imaging studies  CBC: Recent Labs  Lab 04/01/22 1940 04/02/22 0405  04/02/22 1558 04/03/22 0523 04/06/22 0325  WBC 3.8* 6.7 6.8 1.7* 9.2  NEUTROABS 2.4  --   --   --   --   HGB 9.9* 10.2* 9.8* 8.9* 8.6*  HCT 31.7* 32.9* 31.7* 28.9* 27.9*  MCV 86.4 88.2 87.6 87.3 89.4  PLT 172 161 198 182 130*    Basic Metabolic Panel: Recent Labs  Lab 04/03/22 0523 04/04/22 0310 04/05/22 0330 04/06/22 0325 04/07/22 0426  NA 139 133* 135 135 134*  K 3.0* 4.4 4.4 4.2 3.7  CL 106 105 105 103 102  CO2 '27 24 26 27 29  '$ GLUCOSE 185* 162* 130* 138* 89  BUN 24* 32* '23 21 23  '$ CREATININE 1.10 1.37* 0.97 0.91 1.00  CALCIUM 7.9* 7.7* 7.7* 7.6* 7.2*  MG 1.9 2.2  --  1.8  --     GFR: Estimated Creatinine Clearance: 72.1 mL/min (by C-G formula based on SCr of 1 mg/dL). Liver Function Tests: Recent Labs  Lab 04/01/22 1940 04/02/22 0405 04/03/22 0523  AST 42* 45* 41  ALT '25 27 22  '$ ALKPHOS 103 101 80  BILITOT 1.3* 1.2 1.3*  PROT 6.0* 5.8* 5.2*  ALBUMIN 3.2* 2.9* 2.7*    No results for input(s): "LIPASE", "AMYLASE" in the last 168 hours.  Recent Labs  Lab 04/01/22 1940  AMMONIA 18    Coagulation Profile: No results for input(s): "INR", "PROTIME" in the last 168 hours. Cardiac Enzymes: Recent Labs  Lab 04/02/22 1558  CKTOTAL 122  CKMB 2.3    BNP (last 3 results) No results for input(s): "PROBNP" in the last 8760 hours. HbA1C: No results for input(s): "HGBA1C" in the last 72 hours. CBG: Recent Labs  Lab 04/01/22 1926 04/02/22 1427 04/03/22 0018  GLUCAP 65* 82 133*    Lipid Profile: No results for input(s): "CHOL", "HDL", "LDLCALC", "TRIG", "CHOLHDL", "LDLDIRECT" in the last 72 hours. Thyroid Function Tests: No results for input(s): "TSH", "T4TOTAL", "FREET4", "T3FREE", "THYROIDAB" in the last 72 hours. Anemia Panel: No results for input(s): "VITAMINB12", "FOLATE", "FERRITIN", "TIBC", "IRON", "RETICCTPCT" in the last 72 hours. Sepsis Labs: Recent Labs  Lab 04/01/22 1940 04/02/22 0405 04/02/22 1558  PROCALCITON  --  <0.10 <0.10   LATICACIDVEN 1.8  --  1.1     Recent Results (from the past 240 hour(s))  Resp Panel by RT-PCR (Flu A&B, Covid)     Status: Abnormal   Collection Time: 04/01/22 11:13 PM   Specimen: Nasal Swab  Result Value Ref Range Status   SARS Coronavirus 2 by RT PCR POSITIVE (A) NEGATIVE Final    Comment: (NOTE) SARS-CoV-2 target nucleic acids are DETECTED.  The SARS-CoV-2 RNA is generally detectable in upper respiratory specimens during the acute phase of infection. Positive results are indicative of the presence of the identified virus, but do not rule out bacterial infection or co-infection with other pathogens not detected by the test. Clinical correlation with patient history and other diagnostic information is necessary to determine patient infection status. The expected result is Negative.  Fact Sheet for Patients: EntrepreneurPulse.com.au  Fact Sheet for Healthcare Providers: IncredibleEmployment.be  This test is not yet approved or cleared by the [place] FDA and  has been authorized for detection and/or diagnosis of SARS-CoV-2 by FDA under an Emergency Use Authorization (EUA).  This EUA will remain in effect (meaning this test can be used) for the duration of  the COVID-19 declaration under Section 564(b)(1) of the A ct, 21 U.S.C. section 360bbb-3(b)(1), unless the authorization is terminated or revoked sooner.     Influenza A by PCR NEGATIVE NEGATIVE Final   Influenza B by PCR NEGATIVE NEGATIVE Final    Comment: (NOTE) The Xpert Xpress SARS-CoV-2/FLU/RSV plus assay is intended as an aid in the diagnosis of influenza from Nasopharyngeal swab specimens and should not be used as a sole basis for treatment. Nasal washings and aspirates are unacceptable for Xpert Xpress SARS-CoV-2/FLU/RSV testing.  Fact Sheet for Patients: EntrepreneurPulse.com.au  Fact Sheet for Healthcare  Providers: IncredibleEmployment.be  This test is not yet approved or cleared by the [place] FDA and has been authorized for detection and/or diagnosis of SARS-CoV-2 by FDA under an Emergency Use Authorization (EUA). This EUA will remain in effect (meaning this test can be used) for the duration of the COVID-19 declaration under Section 564(b)(1) of the Act, 21 U.S.C. section 360bbb-3(b)(1), unless the authorization is terminated or revoked.  Performed at [facility], [place] [address], [place], [place] [address]   Culture, blood (single)     Status: None   Collection Time: 04/01/22 11:13 PM   Specimen: BLOOD RIGHT HAND  Result Value Ref Range Status   Specimen Description   Final    BLOOD RIGHT HAND Performed at [facility] [address] [place], [place]  [address]    Special Requests   Final    BOTTLES DRAWN AEROBIC AND ANAEROBIC Blood Culture results may not be optimal due to an inadequate volume of blood received in culture bottles Performed at [facility], [place] [address], [place], [place] [address]    Culture   Final    NO GROWTH 5 DAYS Performed at [facility], [place] [address], [place], [place] [address]    Report Status 04/07/2022 FINAL  Final  MRSA Next Gen by PCR, Nasal     Status: None   Collection Time: 04/02/22  1:57 PM   Specimen: Nasal Mucosa; Nasal Swab  Result Value Ref Range Status   MRSA by PCR Next Gen NOT DETECTED NOT DETECTED Final    Comment: (NOTE) The GeneXpert MRSA Assay (FDA approved for NASAL specimens only), is one component of a comprehensive MRSA colonization surveillance program. It is not intended to diagnose MRSA infection nor to guide or monitor treatment for MRSA infections. Test performance is not FDA approved in patients less than 50 years old. Performed at [facility], [place] [address], [place],  [address]        Radiology Studies: No results found.   Scheduled Meds:  apixaban  5 mg Oral BID   atenolol  25 mg Oral Daily   atorvastatin  80 mg Oral QODAY   Chlorhexidine Gluconate Cloth  6 each Topical Daily   guaiFENesin  600 mg Oral BID   mometasone-formoterol  2 puff Inhalation BID   pantoprazole  40 mg Oral QHS   predniSONE  30 mg Oral Q breakfast   sodium chloride flush  3 mL Intravenous Q12H   venlafaxine XR  75 mg Oral Q breakfast   Continuous Infusions:  sodium chloride Stopped (04/03/22 1602)     LOS: 6 days    Flora Lipps, MD [facility] Available via Epic secure chat 7am-7pm After these hours, please refer to coverage provider listed on [email or web address] 04/08/2022, 1:47 PM

## 2022-04-09 DIAGNOSIS — J9611 Chronic respiratory failure with hypoxia: Secondary | ICD-10-CM | POA: Diagnosis not present

## 2022-04-09 DIAGNOSIS — R6510 Systemic inflammatory response syndrome (SIRS) of non-infectious origin without acute organ dysfunction: Secondary | ICD-10-CM | POA: Diagnosis not present

## 2022-04-09 DIAGNOSIS — I5032 Chronic diastolic (congestive) heart failure: Secondary | ICD-10-CM | POA: Diagnosis not present

## 2022-04-09 DIAGNOSIS — I2510 Atherosclerotic heart disease of native coronary artery without angina pectoris: Secondary | ICD-10-CM | POA: Diagnosis not present

## 2022-04-09 NOTE — Progress Notes (Signed)
PROGRESS NOTE    Jordan [name]  IRW:[id number] DOB: 10-21-1950 DOA: 04/01/2022 PCP: [name], MD    Brief Narrative:   Jordan [name] is a 71 y.o. 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  Patient was recently discharged home from rehab and has 24/7 nursing care at home.  Apparently was having some hallucinations and confusion and was brought in back to the hospital.  In the ED patient was noted to be hypotensive.  Patient complained of some knee pain since his replacement 8/21. In the ED, patient was hypotensive, tachypneic and tachycardic. Labs was remarkable for creatinine of 1.2.  WBC at 3.8.   Urinalysis negative for UTI.  COVID test was positive.   Portable chest x-ray was negative for focal consolidation, edema, effusion. Patient was given 2 L normal saline , IV vancomycin, cefepime, Flagyl.  Patient was then admitted to the hospital for further evaluation and treatment.  During hospitalization, patient was seen by physical therapy who recommended skilled nursing facility placement on discharge.  At this time, awaiting for isolation prior to disposition.  Assessment & Plan:  Principal Problem:   SIRS (systemic inflammatory response syndrome) (HCC) Active Problems:   Delirium   Persistent atrial fibrillation (HCC)   Essential hypertension   Chronic heart failure with preserved ejection fraction (HCC)   COPD (chronic obstructive pulmonary disease) (HCC)   Chronic respiratory failure with hypoxia (HCC)   HLD (hyperlipidemia)   CAD (coronary artery disease)   Normocytic anemia    Depression with anxiety   Shock, undifferentiated: Resolved Presented with hypotension.  Tested positive for COVID.  History of chronic diastolic heart failure.  Received IV fluids initially.  Was transferred to ICU for phenylephrine drip briefly.  Was started on steroids.    Blood cultures negative in 5 days.  COVID-19 viral infection  Completed molnupiravir '800mg'$  PO BID x 5 days.  Solu-Medrol has been changed to oral prednisone at this time.  Hypokalemia: Resolved, Latest potassium of 3.7.   Delirium: Resolved   Persistent atrial fibrillation (HCC) Rate controlled.  On Eliquis and atenolol.   Chronic heart failure with preserved ejection fraction ([place]) Patient was hypotensive on presentation and was hypovolemic.  EF 55-60% by TTE 07/14/2021.  Currently Lasix on hold.  Intake and output charting Daily weights.  Patient is negative balance for 3977 ml.   Essential hypertension Continue atenolol.  Chronic respiratory failure with hypoxia  COPD (chronic obstructive pulmonary disease) (HCC)  compensated , Continue  Dulera, albuterol and DuoNebs as needed.Patient is on 2 L O2 via [place] as needed at home.     Depression with anxiety Continue Effexor and doxepin.   Normocytic anemia Latest hemoglobin at 8.6.  Transfuse for hemoglobin less than 7.  Check CBC in AM.  CAD (coronary artery disease) Continue atorvastatin and Eliquis.   HLD (hyperlipidemia) Continue Lipitor  Weakness/deconditioning/debility: Physical therapy has recommended skilled nursing facility placement at this time.  DVT prophylaxis: Eliquis apixaban (ELIQUIS) tablet 5 mg     Code Status: DNR  Family Communication:  None at bedside.  Disposition Plan:  Awaiting for skilled nursing facility placement.  Awaiting for COVID isolation. Status is: Inpatient  Remains inpatient appropriate because: Awaiting  for skilled nursing facility.    Consultants:  PCCM, now signed off  Procedures:   None  Antimicrobials:  None currently.  Subjective: Today, patient was seen and examined at bedside.  Denies any cough, fever, chills, nausea, vomiting, interval complaints.  Objective: Vitals:   04/08/22 0651 04/08/22 1240 04/08/22 2227 04/09/22 0646  BP: 123/82 109/63 111/76 (!) 155/83  Pulse: 95 78 87 86  Resp: '18 20 18   '$ Temp: 97.7 F (36.5 C) 98.2 F (36.8 C) 97.8 F (36.6 C) (!) 97.5 F (36.4 C)  TempSrc: Oral Oral Oral Oral  SpO2: 98% 100% 99% 100%  Weight:      Height:        Intake/Output Summary (Last 24 hours) at 04/09/2022 1403 Last data filed at 04/09/2022 0900 Gross per 24 hour  Intake 480 ml  Output 250 ml  Net 230 ml    Filed Weights   04/04/22 0614 04/05/22 0500 04/07/22 0521  Weight: 76 kg 77.7 kg 75.2 kg   Body mass index is 21.87 kg/m.   Examination:  General:  Average built, not in obvious distress, appears chronically ill on nasal cannula oxygen  HENT:   No scleral pallor or icterus noted. Oral mucosa is moist.  Chest: Clear breath sounds bilaterally.  No crackles or wheezes.  CVS: S1 &S2 heard. No murmur.  Regular rate and rhythm. Abdomen: Soft, nontender, nondistended.  Bowel sounds are heard.   Extremities: No cyanosis, clubbing . peripheral pulses are palpable.  Evidence of chronic venous insufficiency noted.  Trace peripheral edema. Psych: Alert, awake and oriented, normal mood CNS:  No cranial nerve deficits.  Power equal in all extremities.  Moves all extremities. Skin: Warm and dry.  No rashes noted.   Data Reviewed: I have personally reviewed following labs and imaging studies  CBC: Recent Labs  Lab 04/02/22 1558 04/03/22 0523 04/06/22 0325  WBC 6.8 1.7* 9.2  HGB 9.8* 8.9* 8.6*  HCT 31.7* 28.9* 27.9*  MCV 87.6 87.3 89.4  PLT 198 182 130*    Basic Metabolic Panel: Recent Labs  Lab 04/03/22 0523 04/04/22 0310 04/05/22 0330 04/06/22 0325 04/07/22 0426  NA 139 133* 135 135 134*  K 3.0* 4.4 4.4 4.2 3.7  CL 106  105 105 103 102  CO2 '27 24 26 27 29  '$ GLUCOSE 185* 162* 130* 138* 89  BUN 24* 32* '23 21 23  '$ CREATININE 1.10 1.37* 0.97 0.91 1.00  CALCIUM 7.9* 7.7* 7.7* 7.6* 7.2*  MG 1.9 2.2  --  1.8  --     GFR: Estimated Creatinine Clearance: 72.1 mL/min (by C-G formula based on SCr of 1 mg/dL). Liver Function Tests: Recent Labs  Lab 04/03/22 0523  AST 41  ALT 22  ALKPHOS 80  BILITOT 1.3*  PROT 5.2*  ALBUMIN 2.7*    No results for input(s): "LIPASE", "AMYLASE" in the last 168 hours. No results for input(s): "AMMONIA" in the last 168 hours.  Coagulation Profile: No results for input(s): "INR", "PROTIME" in the last 168 hours. Cardiac Enzymes: Recent Labs  Lab 04/02/22 1558  CKTOTAL 122  CKMB 2.3    BNP (last 3 results) No results for input(s): "PROBNP" in the last 8760 hours. HbA1C: No results for input(s): "HGBA1C" in the last 72 hours. CBG: Recent Labs  Lab 04/02/22 1427 04/03/22 0018  GLUCAP 82 133*    Lipid Profile: No results for input(s): "CHOL", "HDL", "LDLCALC", "TRIG", "CHOLHDL", "LDLDIRECT" in the last 72 hours. Thyroid Function Tests: No results for  input(s): "TSH", "T4TOTAL", "FREET4", "T3FREE", "THYROIDAB" in the last 72 hours. Anemia Panel: No results for input(s): "VITAMINB12", "FOLATE", "FERRITIN", "TIBC", "IRON", "RETICCTPCT" in the last 72 hours. Sepsis Labs: Recent Labs  Lab 04/02/22 1558  PROCALCITON <0.10  LATICACIDVEN 1.1     Recent Results (from the past 240 hour(s))  Resp Panel by RT-PCR (Flu A&B, Covid)     Status: Abnormal   Collection Time: 04/01/22 11:13 PM   Specimen: Nasal Swab  Result Value Ref Range Status   SARS Coronavirus 2 by RT PCR POSITIVE (A) NEGATIVE Final    Comment: (NOTE) SARS-CoV-2 target nucleic acids are DETECTED.  The SARS-CoV-2 RNA is generally detectable in upper respiratory specimens during the acute phase of infection. Positive results are indicative of the presence of the identified virus, but do not  rule out bacterial infection or co-infection with other pathogens not detected by the test. Clinical correlation with patient history and other diagnostic information is necessary to determine patient infection status. The expected result is Negative.  Fact Sheet for Patients: EntrepreneurPulse.com.au  Fact Sheet for Healthcare Providers: IncredibleEmployment.be  This test is not yet approved or cleared by the [place] FDA and  has been authorized for detection and/or diagnosis of SARS-CoV-2 by FDA under an Emergency Use Authorization (EUA).  This EUA will remain in effect (meaning this test can be used) for the duration of  the COVID-19 declaration under Section 564(b)(1) of the A ct, 21 U.S.C. section 360bbb-3(b)(1), unless the authorization is terminated or revoked sooner.     Influenza A by PCR NEGATIVE NEGATIVE Final   Influenza B by PCR NEGATIVE NEGATIVE Final    Comment: (NOTE) The Xpert Xpress SARS-CoV-2/FLU/RSV plus assay is intended as an aid in the diagnosis of influenza from Nasopharyngeal swab specimens and should not be used as a sole basis for treatment. Nasal washings and aspirates are unacceptable for Xpert Xpress SARS-CoV-2/FLU/RSV testing.  Fact Sheet for Patients: EntrepreneurPulse.com.au  Fact Sheet for Healthcare Providers: IncredibleEmployment.be  This test is not yet approved or cleared by the [place] FDA and has been authorized for detection and/or diagnosis of SARS-CoV-2 by FDA under an Emergency Use Authorization (EUA). This EUA will remain in effect (meaning this test can be used) for the duration of the COVID-19 declaration under Section 564(b)(1) of the Act, 21 U.S.C. section 360bbb-3(b)(1), unless the authorization is terminated or revoked.  Performed at [facility], [place] [address] [place], [place] [address]   Culture, blood (single)      Status: None   Collection Time: 04/01/22 11:13 PM   Specimen: BLOOD RIGHT HAND  Result Value Ref Range Status   Specimen Description   Final    BLOOD RIGHT HAND Performed at [place] [address], [place], [place] [address]    Special Requests   Final    BOTTLES DRAWN AEROBIC AND ANAEROBIC Blood Culture results may not be optimal due to an inadequate volume of blood received in culture bottles Performed at [facility] [address] [place], [place] [address]    Culture   Final    NO GROWTH 5 DAYS Performed at [facility], [place] [address] [place], [place] [address]    Report Status 04/07/2022 FINAL  Final  MRSA Next Gen by PCR, Nasal     Status: None   Collection Time: 04/02/22  1:57 PM   Specimen: Nasal Mucosa; Nasal Swab  Result Value Ref Range Status   MRSA  by PCR Next Gen NOT DETECTED NOT DETECTED Final    Comment: (NOTE) The GeneXpert MRSA Assay (FDA approved for NASAL specimens only), is one component of a comprehensive MRSA colonization surveillance program. It is not intended to diagnose MRSA infection nor to guide or monitor treatment for MRSA infections. Test performance is not FDA approved in patients less than 79 years old. Performed at [facility], [place] [address] [place], [place] [address]       Radiology Studies: No results found.   Scheduled Meds:  apixaban  5 mg Oral BID   atenolol  25 mg Oral Daily   atorvastatin  80 mg Oral QODAY   Chlorhexidine Gluconate Cloth  6 each Topical Daily   guaiFENesin  600 mg Oral BID   mometasone-formoterol  2 puff Inhalation BID   pantoprazole  40 mg Oral QHS   predniSONE  30 mg Oral Q breakfast   sodium chloride flush  3 mL Intravenous Q12H   venlafaxine XR  75 mg Oral Q breakfast   Continuous Infusions:  sodium chloride Stopped (04/03/22 1602)     LOS: 7 days    Flora Lipps, MD [facility] Available via Epic secure chat  7am-7pm After these hours, please refer to coverage provider listed on [email or web address] 04/09/2022, 2:03 PM

## 2022-04-10 DIAGNOSIS — R6510 Systemic inflammatory response syndrome (SIRS) of non-infectious origin without acute organ dysfunction: Secondary | ICD-10-CM | POA: Diagnosis not present

## 2022-04-10 DIAGNOSIS — I2510 Atherosclerotic heart disease of native coronary artery without angina pectoris: Secondary | ICD-10-CM | POA: Diagnosis not present

## 2022-04-10 DIAGNOSIS — I5032 Chronic diastolic (congestive) heart failure: Secondary | ICD-10-CM | POA: Diagnosis not present

## 2022-04-10 DIAGNOSIS — J9611 Chronic respiratory failure with hypoxia: Secondary | ICD-10-CM | POA: Diagnosis not present

## 2022-04-10 NOTE — Progress Notes (Signed)
PROGRESS NOTE    Jordan [name]  VXB:[id number] DOB: 31-Dec-1950 DOA: 04/01/2022 PCP: [name], MD    Brief Narrative:   Jordan [name] is a 71 y.o. male with past medical history significant for atrial fibrillation on Eliquis, heart failure with preserved ejection fraction, severe aortic stenosis status post TAVR, COPD, chronic respiratory failure with hypoxia on 2 L O2 via [place] prn, coronary artery disease, hypertension, hyperlipidemia, peripheral vascular disease, chronic venous stasis dermatitis, s/p right TKA 02/07/2022, depression/anxiety presented to hospital with confusion.  Patient was recently discharged home from rehab and has 24/7 nursing care at home.  Apparently was having some hallucinations and confusion and was brought in back to the hospital.  In the ED patient was noted to be hypotensive.  Patient complained of some knee pain since his replacement 8/21. In the ED, patient was hypotensive, tachypneic and tachycardic. Labs was remarkable for creatinine of 1.2.  WBC at 3.8.   Urinalysis negative for UTI.  COVID test was positive.   Portable chest x-ray was negative for focal consolidation, edema, effusion. Patient was given 2 L normal saline , IV vancomycin, cefepime, Flagyl.  Patient was then admitted to the hospital for further evaluation and treatment.  During hospitalization, patient was seen by physical therapy who recommended skilled nursing facility placement on discharge.  At this time, patient is awaiting for isolation prior to disposition.  Medically stable for disposition.  Assessment & Plan:  Principal Problem:   SIRS (systemic inflammatory response syndrome) (HCC) Active Problems:   Delirium   Persistent atrial fibrillation (HCC)   Essential hypertension   Chronic heart failure with preserved ejection fraction (HCC)   COPD (chronic obstructive pulmonary disease) (HCC)   Chronic respiratory failure with hypoxia (HCC)   HLD (hyperlipidemia)   CAD (coronary  artery disease)   Normocytic anemia   Depression with anxiety   Shock, undifferentiated: Resolved Was initially in the ICU ICU for phenylephrine drip briefly.    Blood cultures negative in 5 days.  COVID-19 viral infection  Completed molnupiravir '800mg'$  PO BID x 5 days.  Solu-Medrol has been changed to oral prednisone at this time.  Plan is to complete 10-day course.  Hypokalemia: Resolved, Latest potassium of 3.7.   Delirium: Resolved   Persistent atrial fibrillation (HCC) Rate controlled.  On Eliquis and atenolol.   Chronic heart failure with preserved ejection fraction ([place]) Patient was hypotensive on presentation and was hypovolemic.  EF 55-60% by TTE 07/14/2021.  Currently Lasix on hold.  Intake and output charting Daily weights.  Patient is negative balance for 6427 mL   Essential hypertension Continue atenolol.  Chronic respiratory failure with hypoxia  COPD (chronic obstructive pulmonary disease) (HCC)  compensated , Continue  Dulera, albuterol and DuoNebs as needed. Patient is on 2 L O2 via [place] as needed at home.     Depression with anxiety Continue Effexor and doxepin.   Normocytic anemia Latest hemoglobin at 8.6.  Transfuse for hemoglobin less than 7.  Check labs in AM.  CAD (coronary artery disease) Continue atorvastatin and Eliquis.   HLD (hyperlipidemia) Continue Lipitor  Weakness/deconditioning/debility: Physical therapy has recommended skilled nursing facility placement at this time.  DVT prophylaxis: Eliquis apixaban (ELIQUIS) tablet 5 mg     Code Status: DNR  Family Communication:  None at bedside.  Disposition Plan:  Awaiting for skilled nursing facility placement.  Awaiting for COVID isolation period.  Medically stable for disposition.  Status is: Inpatient  Remains inpatient appropriate because: Awaiting for skilled  nursing facility.    Consultants:  PCCM, now signed off  Procedures:  None  Antimicrobials:  None  currently.  Subjective: Today, patient was seen and examined at bedside.  Denies any cough fever nausea vomiting fever or chills  Objective: Vitals:   04/09/22 1458 04/09/22 2028 04/10/22 0512 04/10/22 0832  BP: 112/62 (!) 130/92 125/66 (!) 118/56  Pulse: 77 89 78 74  Resp: '18 18 20 18  '$ Temp: 97.8 F (36.6 C) 98.1 F (36.7 C) 97.6 F (36.4 C) (!) 97.5 F (36.4 C)  TempSrc: Oral Oral  Oral  SpO2: 100% 100% 100% 100%  Weight:   82.6 kg   Height:        Intake/Output Summary (Last 24 hours) at 04/10/2022 1038 Last data filed at 04/10/2022 0800 Gross per 24 hour  Intake 950 ml  Output 3400 ml  Net -2450 ml    Filed Weights   04/05/22 0500 04/07/22 0521 04/10/22 0512  Weight: 77.7 kg 75.2 kg 82.6 kg   Body mass index is 24.01 kg/m.   Examination:  General:  Average built, not in obvious distress, chronically ill, on nasal cannula oxygen. HENT:   No scleral pallor or icterus noted. Oral mucosa is moist.  Chest:  Clear breath sounds.  Diminished breath sounds bilaterally. No crackles or wheezes.  CVS: S1 &S2 heard. No murmur.  Regular rate and rhythm. Abdomen: Soft, nontender, nondistended.  Bowel sounds are heard.   Extremities: No cyanosis, clubbing trace peripheral edema.  Peripheral pulses are palpable.  Lower extremity with chronic venous insufficiency. Psych: Alert, awake and oriented, normal mood CNS:  No cranial nerve deficits.  Power equal in all extremities.   Skin: Warm and dry.  No rashes noted.   Data Reviewed: I have personally reviewed following labs and imaging studies  CBC: Recent Labs  Lab 04/06/22 0325  WBC 9.2  HGB 8.6*  HCT 27.9*  MCV 89.4  PLT 130*    Basic Metabolic Panel: Recent Labs  Lab 04/04/22 0310 04/05/22 0330 04/06/22 0325 04/07/22 0426  NA 133* 135 135 134*  K 4.4 4.4 4.2 3.7  CL 105 105 103 102  CO2 '24 26 27 29  '$ GLUCOSE 162* 130* 138* 89  BUN 32* '23 21 23  '$ CREATININE 1.37* 0.97 0.91 1.00  CALCIUM 7.7* 7.7* 7.6*  7.2*  MG 2.2  --  1.8  --     GFR: Estimated Creatinine Clearance: 76.6 mL/min (by C-G formula based on SCr of 1 mg/dL). Liver Function Tests: No results for input(s): "AST", "ALT", "ALKPHOS", "BILITOT", "PROT", "ALBUMIN" in the last 168 hours.  No results for input(s): "LIPASE", "AMYLASE" in the last 168 hours. No results for input(s): "AMMONIA" in the last 168 hours.  Coagulation Profile: No results for input(s): "INR", "PROTIME" in the last 168 hours. Cardiac Enzymes: No results for input(s): "CKTOTAL", "CKMB", "CKMBINDEX", "TROPONINI" in the last 168 hours.  BNP (last 3 results) No results for input(s): "PROBNP" in the last 8760 hours. HbA1C: No results for input(s): "HGBA1C" in the last 72 hours. CBG: No results for input(s): "GLUCAP" in the last 168 hours.  Lipid Profile: No results for input(s): "CHOL", "HDL", "LDLCALC", "TRIG", "CHOLHDL", "LDLDIRECT" in the last 72 hours. Thyroid Function Tests: No results for input(s): "TSH", "T4TOTAL", "FREET4", "T3FREE", "THYROIDAB" in the last 72 hours. Anemia Panel: No results for input(s): "VITAMINB12", "FOLATE", "FERRITIN", "TIBC", "IRON", "RETICCTPCT" in the last 72 hours. Sepsis Labs: No results for input(s): "PROCALCITON", "LATICACIDVEN" in the last 168 hours.  Recent Results (from the past 240 hour(s))  Resp Panel by RT-PCR (Flu A&B, Covid)     Status: Abnormal   Collection Time: 04/01/22 11:13 PM   Specimen: Nasal Swab  Result Value Ref Range Status   SARS Coronavirus 2 by RT PCR POSITIVE (A) NEGATIVE Final    Comment: (NOTE) SARS-CoV-2 target nucleic acids are DETECTED.  The SARS-CoV-2 RNA is generally detectable in upper respiratory specimens during the acute phase of infection. Positive results are indicative of the presence of the identified virus, but do not rule out bacterial infection or co-infection with other pathogens not detected by the test. Clinical correlation with patient history and other diagnostic  information is necessary to determine patient infection status. The expected result is Negative.  Fact Sheet for Patients: EntrepreneurPulse.com.au  Fact Sheet for Healthcare Providers: IncredibleEmployment.be  This test is not yet approved or cleared by the [place] FDA and  has been authorized for detection and/or diagnosis of SARS-CoV-2 by FDA under an Emergency Use Authorization (EUA).  This EUA will remain in effect (meaning this test can be used) for the duration of  the COVID-19 declaration under Section 564(b)(1) of the A ct, 21 U.S.C. section 360bbb-3(b)(1), unless the authorization is terminated or revoked sooner.     Influenza A by PCR NEGATIVE NEGATIVE Final   Influenza B by PCR NEGATIVE NEGATIVE Final    Comment: (NOTE) The Xpert Xpress SARS-CoV-2/FLU/RSV plus assay is intended as an aid in the diagnosis of influenza from Nasopharyngeal swab specimens and should not be used as a sole basis for treatment. Nasal washings and aspirates are unacceptable for Xpert Xpress SARS-CoV-2/FLU/RSV testing.  Fact Sheet for Patients: EntrepreneurPulse.com.au  Fact Sheet for Healthcare Providers: IncredibleEmployment.be  This test is not yet approved or cleared by the [place] FDA and has been authorized for detection and/or diagnosis of SARS-CoV-2 by FDA under an Emergency Use Authorization (EUA). This EUA will remain in effect (meaning this test can be used) for the duration of the COVID-19 declaration under Section 564(b)(1) of the Act, 21 U.S.C. section 360bbb-3(b)(1), unless the authorization is terminated or revoked.  Performed at [facility], [place] [address], [place], [place] [address]   Culture, blood (single)     Status: None   Collection Time: 04/01/22 11:13 PM   Specimen: BLOOD RIGHT HAND  Result Value Ref Range Status   Specimen Description   Final    BLOOD  RIGHT HAND Performed at [facility] [address] [place], [place] [address]    Special Requests   Final    BOTTLES DRAWN AEROBIC AND ANAEROBIC Blood Culture results may not be optimal due to an inadequate volume of blood received in culture bottles Performed at [facility] [address] [place], [place] [address]    Culture   Final    NO GROWTH 5 DAYS Performed at [facility], [place] [address] [place], [place] [address]    Report Status 04/07/2022 FINAL  Final  MRSA Next Gen by PCR, Nasal     Status: None   Collection Time: 04/02/22  1:57 PM   Specimen: Nasal Mucosa; Nasal Swab  Result Value Ref Range Status   MRSA by PCR Next Gen NOT DETECTED NOT DETECTED Final    Comment: (NOTE) The GeneXpert MRSA Assay (FDA approved for NASAL specimens only), is one component of a comprehensive MRSA colonization surveillance program. It is not intended to diagnose MRSA infection nor to guide or  monitor treatment for MRSA infections. Test performance is not FDA approved in patients less than 78 years old. Performed at [facility], [place] [address] [place], [place] [address]      Radiology Studies: No results found.   Scheduled Meds:  apixaban  5 mg Oral BID   atenolol  25 mg Oral Daily   atorvastatin  80 mg Oral QODAY   Chlorhexidine Gluconate Cloth  6 each Topical Daily   guaiFENesin  600 mg Oral BID   mometasone-formoterol  2 puff Inhalation BID   pantoprazole  40 mg Oral QHS   predniSONE  30 mg Oral Q breakfast   sodium chloride flush  3 mL Intravenous Q12H   venlafaxine XR  75 mg Oral Q breakfast   Continuous Infusions:  sodium chloride Stopped (04/03/22 1602)     LOS: 8 days    Flora Lipps, MD [facility] Available via Epic secure chat 7am-7pm After these hours, please refer to coverage provider listed on [email or web address] 04/10/2022, 10:38 AM

## 2022-04-11 DIAGNOSIS — D508 Other iron deficiency anemias: Secondary | ICD-10-CM

## 2022-04-11 DIAGNOSIS — I10 Essential (primary) hypertension: Secondary | ICD-10-CM

## 2022-04-11 DIAGNOSIS — U071 COVID-19: Principal | ICD-10-CM

## 2022-04-11 DIAGNOSIS — R6510 Systemic inflammatory response syndrome (SIRS) of non-infectious origin without acute organ dysfunction: Secondary | ICD-10-CM | POA: Diagnosis not present

## 2022-04-11 DIAGNOSIS — I5030 Unspecified diastolic (congestive) heart failure: Secondary | ICD-10-CM

## 2022-04-11 LAB — BASIC METABOLIC PANEL
Anion gap: 6 (ref 5–15)
BUN: 23 mg/dL (ref 8–23)
CO2: 26 mmol/L (ref 22–32)
Calcium: 7.6 mg/dL — ABNORMAL LOW (ref 8.9–10.3)
Chloride: 102 mmol/L (ref 98–111)
Creatinine, Ser: 0.99 mg/dL (ref 0.61–1.24)
GFR, Estimated: >60 mL/min (ref >60)
Glucose, Bld: 94 mg/dL (ref 70–99)
Potassium: 4.4 mmol/L (ref 3.5–5.1)
Sodium: 134 mmol/L — ABNORMAL LOW (ref 135–145)

## 2022-04-11 LAB — CBC
HCT: 31.1 % — ABNORMAL LOW (ref 39.0–52.0)
Hemoglobin: 9.4 g/dL — ABNORMAL LOW (ref 13.0–17.0)
MCH: 27.6 pg (ref 26.0–34.0)
MCHC: 30.2 g/dL (ref 30.0–36.0)
MCV: 91.2 fL (ref 80.0–100.0)
Platelets: 164 10*3/uL (ref 150–400)
RBC: 3.41 MIL/uL — ABNORMAL LOW (ref 4.22–5.81)
RDW: 18.6 % — ABNORMAL HIGH (ref 11.5–15.5)
WBC: 11 10*3/uL — ABNORMAL HIGH (ref 4.0–10.5)
nRBC: 0.2 % (ref 0.0–0.2)

## 2022-04-11 MED ORDER — LORAZEPAM 1 MG PO TABS: 1.0000 mg | ORAL_TABLET | Freq: Three times a day (TID) | ORAL | 0 refills | Status: AC | PRN

## 2022-04-11 NOTE — Progress Notes (Signed)
Physical Therapy Treatment Patient Details Name: Jordan [name] MRN: [id number] DOB: May 12, 1951 Today's Date: 04/11/2022   History of Present Illness Pt admitted from home with SOB and confusion and was found to have COVID-19.  Pt with hx o afib, CHF, COPD (on 2L O2 as needed at home, PVD, severe aortic stenosis, s/p TAVR, Bil LE edema, and R TKR 02/07/22    PT Comments    Patient making good progress with mobility and no incontinence with transfer today. Min guard for safety with bed mobility and sit<>stand and intermittent assist for walker positioning with gait. Pt requires cues for posture throughout to improve position to RW for safety. EOS pt required encouragement and education on importance of time OOB. Educated pt to eat all meals OOB in recliner to increase activity. Will continue to progress as able during stay.   Recommendations for follow up therapy are one component of a multi-disciplinary discharge planning process, led by the attending physician.  Recommendations may be updated based on patient status, additional functional criteria and insurance authorization.  Follow Up Recommendations  Skilled nursing-short term rehab (<3 hours/day) Can patient physically be transported by private vehicle: Yes   Assistance Recommended at Discharge Frequent or constant Supervision/Assistance  Patient can return home with the following A little help with walking and/or transfers;A little help with bathing/dressing/bathroom;Assistance with cooking/housework;Assist for transportation;Help with stairs or ramp for entrance   Equipment Recommendations  None recommended by PT    Recommendations for Other Services       Precautions / Restrictions Precautions Precautions: Knee Precaution Comments: recent R TKA so no pillow under knee while resting Restrictions Other Position/Activity Restrictions: WBAT, contact/airborne precautions     Mobility  Bed Mobility Overal bed mobility: Needs  Assistance Bed Mobility: Supine to Sit     Supine to sit: Supervision, HOB elevated     General bed mobility comments: supervision for safety, pt taking extra time and using bed rail to pivot to EOB.    Transfers Overall transfer level: Needs assistance Equipment used: Rolling walker (2 wheels) Transfers: Sit to/from Stand Sit to Stand: Min guard           General transfer comment: cues for hand placement with power up from EOB. pt able to control lower to recliner.    Ambulation/Gait Ambulation/Gait assistance: Min assist, Min guard Gait Distance (Feet): 120 Feet Assistive device: Rolling walker (2 wheels) Gait Pattern/deviations: Step-through pattern, Decreased stride length, Shuffle, Trunk flexed, Narrow base of support Gait velocity: decr     General Gait Details: pt required repeated cues to maintain safe proximity to RW and improve posture. overall steps low and shuffled but no buckling or LOB noted. SpO2 91% or better with gait on 2L/min.   Stairs             Wheelchair Mobility    Modified Rankin (Stroke Patients Only)       Balance Overall balance assessment: Needs assistance Sitting-balance support: Feet supported Sitting balance-Leahy Scale: Fair     Standing balance support: Bilateral upper extremity supported Standing balance-Leahy Scale: Poor                              Cognition Arousal/Alertness: Awake/alert Behavior During Therapy: WFL for tasks assessed/performed, Anxious Overall Cognitive Status: Within Functional Limits for tasks assessed  General Comments: pleasant and motivated        Exercises      General Comments        Pertinent Vitals/Pain Pain Assessment Pain Assessment: No/denies pain    Home Living                          Prior Function            PT Goals (current goals can now be found in the care plan section) Acute Rehab PT  Goals Patient Stated Goal: Return home with 24/7 PCA PT Goal Formulation: With patient Time For Goal Achievement: 04/17/22 Potential to Achieve Goals: Fair Progress towards PT goals: Progressing toward goals    Frequency    Min 3X/week      PT Plan Current plan remains appropriate    Co-evaluation              AM-PAC PT "6 Clicks" Mobility   Outcome Measure  Help needed turning from your back to your side while in a flat bed without using bedrails?: A Little Help needed moving from lying on your back to sitting on the side of a flat bed without using bedrails?: A Little Help needed moving to and from a bed to a chair (including a wheelchair)?: A Little Help needed standing up from a chair using your arms (e.g., wheelchair or bedside chair)?: A Little Help needed to walk in hospital room?: A Little Help needed climbing 3-5 steps with a railing? : A Lot 6 Click Score: 17    End of Session Equipment Utilized During Treatment: Gait belt;Oxygen Activity Tolerance: Patient tolerated treatment well Patient left: in bed;with call bell/phone within reach;with bed alarm set (chair position) Nurse Communication: Mobility status PT Visit Diagnosis: Muscle weakness (generalized) (M62.81);Difficulty in walking, not elsewhere classified (R26.2)     Time: 6837-2902 PT Time Calculation (min) (ACUTE ONLY): 23 min  Charges:  $Gait Training: 8-22 mins $Therapeutic Activity: 8-22 mins                     Verner Mould, DPT Acute Rehabilitation Services Office [phone]  04/11/22 12:16 PM

## 2022-04-11 NOTE — Discharge Summary (Addendum)
Physician Discharge Summary  Jordan [name] KNL:[id number] DOB: 10/29/1950 DOA: 04/01/2022  PCP: [name], MD  Admit date: 04/01/2022 Discharge date: 04/12/2022  Admitted From: Home Disposition: Skilled nursing rehab  Recommendations for Outpatient Follow-up:  Follow up with PCP in 1-2 weeks after discharge Please obtain BMP/CBC in one week   Home Health: N/A Equipment/Devices: N/A  Discharge Condition: Stable CODE STATUS: Full code Diet recommendation: Regular low-salt diet  04/12/2022, addendum.  Patient seen and examined.  He could not be discharged yesterday.  Patient is stable for discharge.  No change in medications or plan of care needed.  Stable for transfer.  Discharge summary: Jordan [name] is a 71 y.o. male with past medical history significant for atrial fibrillation on Eliquis, heart failure with preserved ejection fraction, severe aortic stenosis status post TAVR, COPD, chronic respiratory failure with hypoxia on 2 L O2 via [place] prn, coronary artery disease, hypertension, hyperlipidemia, peripheral vascular disease, chronic venous stasis dermatitis, s/p right TKA 02/07/2022, depression/anxiety presented to hospital with confusion.  Patient was recently discharged home from rehab and has 24/7 nursing care at home.  Apparently was having some hallucinations and confusion and was brought in back to the hospital.  In the ED patient was noted to be hypotensive.  Patient complained of some knee pain since his replacement 8/21. In the ED, patient was hypotensive, tachypneic and tachycardic. Labs was remarkable for creatinine of 1.2.  WBC at 3.8.   Urinalysis negative for UTI.  COVID test was positive.   Portable chest x-ray was negative for focal consolidation, edema, effusion. Patient was given 2 L normal saline , IV vancomycin, cefepime, Flagyl.  Patient was then admitted to the hospital for further evaluation and treatment.   Patient clinically stabilized.  He waited in  the hospital to complete 10 days of isolation as skilled nursing facilities were not accepting patient for 10 days with COVID diagnosis.    Assessment & Plan:  Principal Problem:   SIRS (systemic inflammatory response syndrome) (HCC) Active Problems:   Delirium   Persistent atrial fibrillation (HCC)   Essential hypertension   Chronic heart failure with preserved ejection fraction (HCC)   COPD (chronic obstructive pulmonary disease) (HCC)   Chronic respiratory failure with hypoxia (HCC)   HLD (hyperlipidemia)   CAD (coronary artery disease)   Normocytic anemia   Depression with anxiety   Sepsis ruled out.  Shock, undifferentiated: Resolved. Was initially in the ICU ICU for phenylephrine drip briefly.    Blood cultures negative in 5 days.   COVID-19 viral infection Completed treatment with molnupiravir, Solu-Medrol and prednisone.  Currently symptoms improved.  He can come off isolation.    Delirium: Resolved   Persistent atrial fibrillation (HCC) Rate controlled.  On Eliquis and atenolol.    Chronic heart failure with preserved ejection fraction ([place]) Patient was hypotensive on presentation and was hypovolemic.  EF 55-60% by TTE 07/14/2021.  Adequately improved.  Go back on Lasix.  Essential hypertension Continue atenolol.   Chronic respiratory failure with hypoxia  COPD (chronic obstructive pulmonary disease) (HCC)  compensated , Continue  Dulera, albuterol and DuoNebs as needed. Patient is on 2 L O2 via [place] as needed at home.     Depression with anxiety Continue Effexor and doxepin.   Normocytic anemia Chronic and stable.   CAD (coronary artery disease) Continue atorvastatin and Eliquis.   HLD (hyperlipidemia) Continue Lipitor   Weakness/deconditioning/debility: Physical therapy has recommended skilled nursing facility placement at this time.  Patient is stable  to transfer to a SNF level of care.  Discharge Diagnoses:  Principal Problem:   SIRS (systemic  inflammatory response syndrome) (HCC) Active Problems:   Delirium   Persistent atrial fibrillation (HCC)   Essential hypertension   Chronic heart failure with preserved ejection fraction (HCC)   COPD (chronic obstructive pulmonary disease) (HCC)   Chronic respiratory failure with hypoxia (HCC)   HLD (hyperlipidemia)   CAD (coronary artery disease)   Normocytic anemia   Depression with anxiety    Discharge Instructions  Discharge Instructions     Diet - low sodium heart healthy   Complete by: As directed    Increase activity slowly   Complete by: As directed       Allergies as of 04/12/2022       Reactions   Altace [ramipril] Other (See Comments)   Dizziness, migraine, weakness- "there is dye in it"   Codeine Itching   Metoprolol Tartrate Other (See Comments)   Dropped the B/P too low- eventually stopped by a MD   Naproxen Itching        Medication List     STOP taking these medications    folic acid 1 MG tablet Commonly known as: FOLVITE   Metoprolol Tartrate 75 MG Tabs   oxyCODONE 5 MG immediate release tablet Commonly known as: Oxy IR/ROXICODONE   promethazine 25 MG tablet Commonly known as: PHENERGAN       TAKE these medications    ACETAMINOPHEN-BUTALBITAL 50-325 MG Tabs Take 1 tablet by mouth 2 (two) times daily as needed (migraines).   albuterol 108 (90 Base) MCG/ACT inhaler Commonly known as: VENTOLIN HFA Inhale 1-2 puffs into the lungs every 6 (six) hours as needed for wheezing or shortness of breath.   atenolol 50 MG tablet Commonly known as: TENORMIN Take 50 mg by mouth daily.   atorvastatin 80 MG tablet Commonly known as: LIPITOR Take 80 mg by mouth every other day.   bismuth subsalicylate 510 CH/85ID suspension Commonly known as: PEPTO BISMOL Take 30 mLs by mouth every 6 (six) hours as needed for indigestion.   budesonide-formoterol 160-4.5 MCG/ACT inhaler Commonly known as: Symbicort Inhale 2 puffs into the lungs in the  morning and at bedtime. What changed:  when to take this reasons to take this   Clear Eyes Adv Dry & Itchy Rlf 0.25 % Soln Generic drug: Glycerin Apply 1 drop to eye daily as needed (dry eyes).   CORICIDIN D PO Take 1 tablet by mouth daily as needed (for congestion).   doxepin 50 MG capsule Commonly known as: SINEQUAN Take 50 mg by mouth at bedtime as needed (for sleep).   Effexor XR 75 MG 24 hr capsule Generic drug: venlafaxine XR Take 75 mg by mouth daily with breakfast.   Eliquis 5 MG Tabs tablet Generic drug: apixaban TAKE 1 TABLET BY MOUTH TWICE A DAY   furosemide 40 MG tablet Commonly known as: LASIX TAKE 1 TABLET BY MOUTH EVERY OTHER DAY ALTERNATING WITH 1/2 TABLET EVERY OTHER DAY What changed: See the new instructions.   ipratropium-albuterol 0.5-2.5 (3) MG/3ML Soln Commonly known as: DUONEB Take 3 mLs by nebulization every 6 (six) hours as needed (shortness of breath/wheezing).   LORazepam 1 MG tablet Commonly known as: ATIVAN Take 1 tablet (1 mg total) by mouth every 8 (eight) hours as needed for up to 5 days for anxiety.   methocarbamol 500 MG tablet Commonly known as: ROBAXIN Take 1 tablet (500 mg total) by mouth every 6 (six) hours  as needed for muscle spasms.   montelukast 10 MG tablet Commonly known as: SINGULAIR Take 10 mg by mouth as needed (for allergies).   multivitamin with minerals Tabs tablet Take 1 tablet by mouth daily with breakfast.   nystatin cream Commonly known as: MYCOSTATIN APPLY TO AFFECTED AREA TWICE A DAY What changed: See the new instructions.   omeprazole 20 MG tablet Commonly known as: PRILOSEC OTC Take 20 mg by mouth daily as needed (heartburn).        Contact information for after-discharge care     Destination     [facility] Preferred SNF .   Service: Skilled Nursing Contact information: [address] [place] [address] [phone]                    Allergies   Allergen Reactions   Altace [Ramipril] Other (See Comments)    Dizziness, migraine, weakness- "there is dye in it"   Codeine Itching   Metoprolol Tartrate Other (See Comments)    Dropped the B/P too low- eventually stopped by a MD   Naproxen Itching    Consultations: Critical care   Procedures/Studies: DG CHEST PORT 1 VIEW  Result Date: 04/02/2022 CLINICAL DATA:  PICC line placement EXAM: PORTABLE CHEST 1 VIEW COMPARISON:  04/02/2022 FINDINGS: Left PICC line is in place with the tip in the right atrium approximately 3 cm deep to the cavoatrial junction. Heart is normal size. No confluent opacities or effusions. No acute bony abnormality. IMPRESSION: Left PICC line tip in the upper right atrium 3 cm deep to the cavoatrial junction. Electronically Signed   By: Rolm Baptise M.D.   On: 04/02/2022 19:06   DG CHEST PORT 1 VIEW  Result Date: 04/02/2022 CLINICAL DATA:  Coronavirus infection.  Tachycardia. EXAM: PORTABLE CHEST 1 VIEW COMPARISON:  04/01/2022 FINDINGS: Heart size upper limits of normal. Aortic atherosclerotic calcification as seen previously. The lungs are clear. No infiltrate, collapse or effusion. IMPRESSION: No active disease. Aortic atherosclerotic calcification. Electronically Signed   By: Nelson Chimes M.D.   On: 04/02/2022 16:59   [place] EKG SITE RITE  Result Date: 04/02/2022 If Site Rite image not attached, placement could not be confirmed due to current cardiac rhythm.  [place] LIMITED JOINT SPACE STRUCTURES LOW RIGHT  Result Date: 04/02/2022 CLINICAL DATA:  History of TKA 2 months ago, presenting with swelling of the right knee joint. EXAM: ULTRASOUND RIGHT LOWER EXTREMITY LIMITED TECHNIQUE: Ultrasound examination of the lower extremity soft tissues was performed in the area of clinical concern. COMPARISON:  None Available. FINDINGS: Joint Space: No effusion. Muscles: Normal. Tendons: Normal Other Soft Tissue Structures: Normal. IMPRESSION: Unremarkable RIGHT lower extremity  ultrasound. Electronically Signed   By: Virgina Norfolk M.D.   On: 04/02/2022 00:42   DG Chest Portable 1 View  Result Date: 04/01/2022 CLINICAL DATA:  Altered mental status.  Confusion. EXAM: PORTABLE CHEST 1 VIEW COMPARISON:  Radiograph 02/23/2021 FINDINGS: Patient's chin partially obscures the right lung apex. Lung volumes are low. Mild chronic cardiomegaly. TAVR. No acute airspace disease, pulmonary edema, large pleural effusion or pneumothorax. IMPRESSION: Low lung volumes. Mild chronic cardiomegaly. Electronically Signed   By: Keith Rake M.D.   On: 04/01/2022 19:57   (Echo, Carotid, EGD, Colonoscopy, ERCP)    Subjective: Patient seen and examined.  Denies any complaints.  He feels his breathing is about back to himself.  Eager to go to a SNF for rehab.   Discharge Exam: Vitals:   04/11/22 2011  04/12/22 0309  BP: 100/68 123/68  Pulse: 89 95  Resp: 18 20  Temp: 98.6 F (37 C) 97.9 F (36.6 C)  SpO2: 98% 98%   Vitals:   04/11/22 1500 04/11/22 2011 04/12/22 0309 04/12/22 0335  BP:  100/68 123/68   Pulse:  89 95   Resp:  18 20   Temp:  98.6 F (37 C) 97.9 F (36.6 C)   TempSrc:  Oral Oral   SpO2: 99% 98% 98%   Weight:    74.8 kg  Height:        General: Pt is alert, awake, not in acute distress Chronically ill looking and frail.  On 2 L oxygen. Cardiovascular: RRR, S1/S2 +, no rubs, no gallops Respiratory: CTA bilaterally, no wheezing, no rhonchi Abdominal: Soft, NT, ND, bowel sounds + Extremities: no edema, no cyanosis    The results of significant diagnostics from this hospitalization (including imaging, microbiology, ancillary and laboratory) are listed below for reference.     Microbiology: Recent Results (from the past 240 hour(s))  MRSA Next Gen by PCR, Nasal     Status: None   Collection Time: 04/02/22  1:57 PM   Specimen: Nasal Mucosa; Nasal Swab  Result Value Ref Range Status   MRSA by PCR Next Gen NOT DETECTED NOT DETECTED Final    Comment:  (NOTE) The GeneXpert MRSA Assay (FDA approved for NASAL specimens only), is one component of a comprehensive MRSA colonization surveillance program. It is not intended to diagnose MRSA infection nor to guide or monitor treatment for MRSA infections. Test performance is not FDA approved in patients less than 22 years old. Performed at [facility], [place] [address], [place], [place] [address]      Labs: BNP (last 3 results) Recent Labs    04/02/22 1558  BNP 818.2*   Basic Metabolic Panel: Recent Labs  Lab 04/06/22 0325 04/07/22 0426 04/11/22 0220  NA 135 134* 134*  K 4.2 3.7 4.4  CL 103 102 102  CO2 '27 29 26  '$ GLUCOSE 138* 89 94  BUN '21 23 23  '$ CREATININE 0.91 1.00 0.99  CALCIUM 7.6* 7.2* 7.6*  MG 1.8  --   --    Liver Function Tests: No results for input(s): "AST", "ALT", "ALKPHOS", "BILITOT", "PROT", "ALBUMIN" in the last 168 hours. No results for input(s): "LIPASE", "AMYLASE" in the last 168 hours. No results for input(s): "AMMONIA" in the last 168 hours. CBC: Recent Labs  Lab 04/06/22 0325 04/11/22 0220  WBC 9.2 11.0*  HGB 8.6* 9.4*  HCT 27.9* 31.1*  MCV 89.4 91.2  PLT 130* 164   Cardiac Enzymes: No results for input(s): "CKTOTAL", "CKMB", "CKMBINDEX", "TROPONINI" in the last 168 hours. BNP: Invalid input(s): "POCBNP" CBG: No results for input(s): "GLUCAP" in the last 168 hours. D-Dimer No results for input(s): "DDIMER" in the last 72 hours. Hgb A1c No results for input(s): "HGBA1C" in the last 72 hours. Lipid Profile No results for input(s): "CHOL", "HDL", "LDLCALC", "TRIG", "CHOLHDL", "LDLDIRECT" in the last 72 hours. Thyroid function studies No results for input(s): "TSH", "T4TOTAL", "T3FREE", "THYROIDAB" in the last 72 hours.  Invalid input(s): "FREET3" Anemia work up No results for input(s): "VITAMINB12", "FOLATE", "FERRITIN", "TIBC", "IRON", "RETICCTPCT" in the last 72 hours. Urinalysis    Component Value Date/Time    COLORURINE YELLOW 04/01/2022 1940   APPEARANCEUR CLEAR 04/01/2022 1940   LABSPEC 1.006 04/01/2022 1940   PHURINE 6.0 04/01/2022 1940   GLUCOSEU NEGATIVE 04/01/2022 1940   HGBUR NEGATIVE  04/01/2022 [place] 04/01/2022 1940   [place] NEGATIVE 04/01/2022 1940   PROTEINUR NEGATIVE 04/01/2022 1940   NITRITE NEGATIVE 04/01/2022 1940   LEUKOCYTESUR NEGATIVE 04/01/2022 1940   Sepsis Labs Recent Labs  Lab 04/06/22 0325 04/11/22 0220  WBC 9.2 11.0*   Microbiology Recent Results (from the past 240 hour(s))  MRSA Next Gen by PCR, Nasal     Status: None   Collection Time: 04/02/22  1:57 PM   Specimen: Nasal Mucosa; Nasal Swab  Result Value Ref Range Status   MRSA by PCR Next Gen NOT DETECTED NOT DETECTED Final    Comment: (NOTE) The GeneXpert MRSA Assay (FDA approved for NASAL specimens only), is one component of a comprehensive MRSA colonization surveillance program. It is not intended to diagnose MRSA infection nor to guide or monitor treatment for MRSA infections. Test performance is not FDA approved in patients less than 52 years old. Performed at [facility], [place] [address], [place], [place] [address]      Time coordinating discharge: 35 minutes  SIGNED:   Barb Merino, MD  [facility] 04/12/2022, 10:34 AM

## 2022-04-11 NOTE — TOC Progression Note (Addendum)
Transition of Care [facility]) - Progression Note    Patient Details  Name: Jordan [name] MRN: [id number] Date of Birth: [birth year]  Transition of Care [facility]) CM/SW Contact  Ross Ludwig, [place] Phone Number: 04/11/2022, 2:22 PM  Clinical Narrative:    [facility] patient pending insurance authorization.  Authorization was started reference number [id number].  CSW awaiting insurance auth.  6:50pm  Patient has been approved for SNF placement at [facility], reference number [id number], next review 10/25.  CSW updated attending physician, bedside nurse, and SNF admissions worker [place].  If patient is medically ready, SNF can accept tomorrow.   Expected Discharge Plan: [place] Barriers to Discharge: SNF Covid, Continued Medical Work up  Expected Discharge Plan and Services Expected Discharge Plan: [place] In-house Referral: NA Discharge Planning Services: CM Consult Post Acute Care Choice: [place] Living arrangements for the past 2 months: Single Family Home Expected Discharge Date: 04/11/22               DME Arranged: N/A DME Agency: NA                   Social Determinants of Health (SDOH) Interventions    Readmission Risk Interventions    04/04/2022   11:02 AM  Readmission Risk Prevention Plan  Transportation Screening Complete  PCP or Specialist Appt within 3-5 Days Complete  HRI or [place] Complete  Social Work Consult for [place] Planning/Counseling Complete  Palliative Care Screening Not Applicable  Medication Review Press photographer) Complete

## 2022-04-12 DIAGNOSIS — I700 Atherosclerosis of aorta: Secondary | ICD-10-CM | POA: Diagnosis not present

## 2022-04-12 DIAGNOSIS — J9610 Chronic respiratory failure, unspecified whether with hypoxia or hypercapnia: Secondary | ICD-10-CM | POA: Diagnosis not present

## 2022-04-12 DIAGNOSIS — N183 Chronic kidney disease, stage 3 unspecified: Secondary | ICD-10-CM | POA: Diagnosis not present

## 2022-04-12 DIAGNOSIS — I739 Peripheral vascular disease, unspecified: Secondary | ICD-10-CM | POA: Diagnosis not present

## 2022-04-12 DIAGNOSIS — R278 Other lack of coordination: Secondary | ICD-10-CM | POA: Diagnosis not present

## 2022-04-12 DIAGNOSIS — J449 Chronic obstructive pulmonary disease, unspecified: Secondary | ICD-10-CM | POA: Diagnosis not present

## 2022-04-12 DIAGNOSIS — Z8616 Personal history of COVID-19: Secondary | ICD-10-CM | POA: Diagnosis not present

## 2022-04-12 DIAGNOSIS — I5032 Chronic diastolic (congestive) heart failure: Secondary | ICD-10-CM | POA: Diagnosis not present

## 2022-04-12 DIAGNOSIS — D649 Anemia, unspecified: Secondary | ICD-10-CM | POA: Diagnosis not present

## 2022-04-12 DIAGNOSIS — I4891 Unspecified atrial fibrillation: Secondary | ICD-10-CM | POA: Diagnosis not present

## 2022-04-12 DIAGNOSIS — F4320 Adjustment disorder, unspecified: Secondary | ICD-10-CM | POA: Diagnosis not present

## 2022-04-12 DIAGNOSIS — M6281 Muscle weakness (generalized): Secondary | ICD-10-CM | POA: Diagnosis not present

## 2022-04-12 DIAGNOSIS — G4730 Sleep apnea, unspecified: Secondary | ICD-10-CM | POA: Diagnosis not present

## 2022-04-12 DIAGNOSIS — I10 Essential (primary) hypertension: Secondary | ICD-10-CM | POA: Diagnosis not present

## 2022-04-12 DIAGNOSIS — I4811 Longstanding persistent atrial fibrillation: Secondary | ICD-10-CM | POA: Diagnosis not present

## 2022-04-12 DIAGNOSIS — R4182 Altered mental status, unspecified: Secondary | ICD-10-CM | POA: Diagnosis not present

## 2022-04-12 DIAGNOSIS — R531 Weakness: Secondary | ICD-10-CM | POA: Diagnosis not present

## 2022-04-12 DIAGNOSIS — D631 Anemia in chronic kidney disease: Secondary | ICD-10-CM | POA: Diagnosis not present

## 2022-04-12 DIAGNOSIS — F411 Generalized anxiety disorder: Secondary | ICD-10-CM | POA: Diagnosis not present

## 2022-04-12 DIAGNOSIS — F5105 Insomnia due to other mental disorder: Secondary | ICD-10-CM | POA: Diagnosis not present

## 2022-04-12 DIAGNOSIS — J441 Chronic obstructive pulmonary disease with (acute) exacerbation: Secondary | ICD-10-CM | POA: Diagnosis not present

## 2022-04-12 DIAGNOSIS — R6510 Systemic inflammatory response syndrome (SIRS) of non-infectious origin without acute organ dysfunction: Secondary | ICD-10-CM

## 2022-04-12 DIAGNOSIS — M25561 Pain in right knee: Secondary | ICD-10-CM | POA: Diagnosis not present

## 2022-04-12 DIAGNOSIS — I2510 Atherosclerotic heart disease of native coronary artery without angina pectoris: Secondary | ICD-10-CM | POA: Diagnosis not present

## 2022-04-12 DIAGNOSIS — Z720 Tobacco use: Secondary | ICD-10-CM | POA: Diagnosis not present

## 2022-04-12 DIAGNOSIS — U071 COVID-19: Secondary | ICD-10-CM | POA: Diagnosis not present

## 2022-04-12 DIAGNOSIS — Z7401 Bed confinement status: Secondary | ICD-10-CM | POA: Diagnosis not present

## 2022-04-12 DIAGNOSIS — R2689 Other abnormalities of gait and mobility: Secondary | ICD-10-CM | POA: Diagnosis not present

## 2022-04-12 DIAGNOSIS — Z23 Encounter for immunization: Secondary | ICD-10-CM | POA: Diagnosis not present

## 2022-04-12 DIAGNOSIS — F32A Depression, unspecified: Secondary | ICD-10-CM | POA: Diagnosis not present

## 2022-04-12 DIAGNOSIS — R2681 Unsteadiness on feet: Secondary | ICD-10-CM | POA: Diagnosis not present

## 2022-04-12 DIAGNOSIS — R7989 Other specified abnormal findings of blood chemistry: Secondary | ICD-10-CM | POA: Diagnosis not present

## 2022-04-12 NOTE — TOC Transition Note (Signed)
Transition of Care [facility]) - CM/SW Discharge Note   Patient Details  Name: Jordan [name] MRN: [id number] Date of Birth: [birth year]  Transition of Care [facility]) CM/SW Contact:  Ross Ludwig, LCSW Phone Number: 04/12/2022, 10:34 AM   Clinical Narrative:     Patient to be d/c'ed today to [facility] and [place] room 1007.  Patient and family agreeable to plans will transport via ems RN to call report to [phone].  Patient is aware that he is discharging today, per SNF they can accept patient around 1pm.    Final next level of care: Skilled Nursing Facility Barriers to Discharge: Barriers Resolved   Patient Goals and CMS Choice Patient states their goals for this hospitalization and ongoing recovery are:: To go to SNF then return back home. CMS Medicare.gov Compare Post Acute Care list provided to:: Patient Choice offered to / list presented to : Patient  Discharge Placement   Existing PASRR number confirmed : 04/04/22          Patient chooses bed at: Other - please specify in the comment section below: ([place]) Patient to be transferred to facility by: [facility] Name of family member notified: Patient Patient and family notified of of transfer: 04/12/22  Discharge Plan and Services In-house Referral: NA Discharge Planning Services: CM Consult Post Acute Care Choice: [place]          DME Arranged: N/A                    Social Determinants of Health (SDOH) Interventions     Readmission Risk Interventions    04/04/2022   11:02 AM  Readmission Risk Prevention Plan  Transportation Screening Complete  PCP or Specialist Appt within 3-5 Days Complete  HRI or [place] Complete  Social Work Consult for [place] Planning/Counseling Complete  Palliative Care Screening Not Applicable  Medication Review Press photographer) Complete

## 2022-04-12 NOTE — Progress Notes (Signed)
Patient could not be discharged to a skilled nursing home yesterday because of prior authorization.  Patient seen and examined.  Discharge summary reviewed and no changes are needed.  Patient is stable for transfer.   No charge visit.

## 2022-04-12 NOTE — Final Progress Note (Signed)
Pt discharged to [facility]: AVS in discharge packet.  Report given to [place] at [facility].  Transported via [facility], sent with all belongings

## 2022-04-13 ENCOUNTER — Other Ambulatory Visit: Payer: Medicare PPO

## 2022-04-13 DIAGNOSIS — Z8616 Personal history of COVID-19: Secondary | ICD-10-CM | POA: Diagnosis not present

## 2022-04-13 DIAGNOSIS — I5032 Chronic diastolic (congestive) heart failure: Secondary | ICD-10-CM | POA: Diagnosis not present

## 2022-04-13 DIAGNOSIS — R4182 Altered mental status, unspecified: Secondary | ICD-10-CM | POA: Diagnosis not present

## 2022-04-13 DIAGNOSIS — R6510 Systemic inflammatory response syndrome (SIRS) of non-infectious origin without acute organ dysfunction: Secondary | ICD-10-CM | POA: Diagnosis not present

## 2022-04-13 DIAGNOSIS — M6281 Muscle weakness (generalized): Secondary | ICD-10-CM | POA: Diagnosis not present

## 2022-04-13 DIAGNOSIS — I10 Essential (primary) hypertension: Secondary | ICD-10-CM | POA: Diagnosis not present

## 2022-04-13 DIAGNOSIS — I4811 Longstanding persistent atrial fibrillation: Secondary | ICD-10-CM | POA: Diagnosis not present

## 2022-04-15 DIAGNOSIS — J441 Chronic obstructive pulmonary disease with (acute) exacerbation: Secondary | ICD-10-CM | POA: Diagnosis not present

## 2022-04-15 DIAGNOSIS — I5032 Chronic diastolic (congestive) heart failure: Secondary | ICD-10-CM | POA: Diagnosis not present

## 2022-04-15 DIAGNOSIS — I10 Essential (primary) hypertension: Secondary | ICD-10-CM | POA: Diagnosis not present

## 2022-04-15 DIAGNOSIS — D649 Anemia, unspecified: Secondary | ICD-10-CM | POA: Diagnosis not present

## 2022-04-15 DIAGNOSIS — I4811 Longstanding persistent atrial fibrillation: Secondary | ICD-10-CM | POA: Diagnosis not present

## 2022-04-15 DIAGNOSIS — N183 Chronic kidney disease, stage 3 unspecified: Secondary | ICD-10-CM | POA: Diagnosis not present

## 2022-04-15 DIAGNOSIS — I700 Atherosclerosis of aorta: Secondary | ICD-10-CM | POA: Diagnosis not present

## 2022-04-15 DIAGNOSIS — R4182 Altered mental status, unspecified: Secondary | ICD-10-CM | POA: Diagnosis not present

## 2022-04-15 DIAGNOSIS — Z720 Tobacco use: Secondary | ICD-10-CM | POA: Diagnosis not present

## 2022-04-15 DIAGNOSIS — Z8616 Personal history of COVID-19: Secondary | ICD-10-CM | POA: Diagnosis not present

## 2022-04-15 DIAGNOSIS — J9610 Chronic respiratory failure, unspecified whether with hypoxia or hypercapnia: Secondary | ICD-10-CM | POA: Diagnosis not present

## 2022-04-15 DIAGNOSIS — R6510 Systemic inflammatory response syndrome (SIRS) of non-infectious origin without acute organ dysfunction: Secondary | ICD-10-CM | POA: Diagnosis not present

## 2022-04-15 DIAGNOSIS — I2510 Atherosclerotic heart disease of native coronary artery without angina pectoris: Secondary | ICD-10-CM | POA: Diagnosis not present

## 2022-04-15 DIAGNOSIS — I739 Peripheral vascular disease, unspecified: Secondary | ICD-10-CM | POA: Diagnosis not present

## 2022-04-15 DIAGNOSIS — I4891 Unspecified atrial fibrillation: Secondary | ICD-10-CM | POA: Diagnosis not present

## 2022-04-15 DIAGNOSIS — M6281 Muscle weakness (generalized): Secondary | ICD-10-CM | POA: Diagnosis not present

## 2022-04-18 DIAGNOSIS — I10 Essential (primary) hypertension: Secondary | ICD-10-CM | POA: Diagnosis not present

## 2022-04-18 DIAGNOSIS — R4182 Altered mental status, unspecified: Secondary | ICD-10-CM | POA: Diagnosis not present

## 2022-04-18 DIAGNOSIS — I5032 Chronic diastolic (congestive) heart failure: Secondary | ICD-10-CM | POA: Diagnosis not present

## 2022-04-18 DIAGNOSIS — J441 Chronic obstructive pulmonary disease with (acute) exacerbation: Secondary | ICD-10-CM | POA: Diagnosis not present

## 2022-04-18 DIAGNOSIS — Z8616 Personal history of COVID-19: Secondary | ICD-10-CM | POA: Diagnosis not present

## 2022-04-18 DIAGNOSIS — I4811 Longstanding persistent atrial fibrillation: Secondary | ICD-10-CM | POA: Diagnosis not present

## 2022-04-18 DIAGNOSIS — R6510 Systemic inflammatory response syndrome (SIRS) of non-infectious origin without acute organ dysfunction: Secondary | ICD-10-CM | POA: Diagnosis not present

## 2022-04-18 DIAGNOSIS — M6281 Muscle weakness (generalized): Secondary | ICD-10-CM | POA: Diagnosis not present

## 2022-04-18 DIAGNOSIS — J9610 Chronic respiratory failure, unspecified whether with hypoxia or hypercapnia: Secondary | ICD-10-CM | POA: Diagnosis not present

## 2022-04-19 DIAGNOSIS — I10 Essential (primary) hypertension: Secondary | ICD-10-CM | POA: Diagnosis not present

## 2022-04-19 DIAGNOSIS — I739 Peripheral vascular disease, unspecified: Secondary | ICD-10-CM | POA: Diagnosis not present

## 2022-04-19 DIAGNOSIS — Z720 Tobacco use: Secondary | ICD-10-CM | POA: Diagnosis not present

## 2022-04-19 DIAGNOSIS — I4811 Longstanding persistent atrial fibrillation: Secondary | ICD-10-CM | POA: Diagnosis not present

## 2022-04-19 DIAGNOSIS — D649 Anemia, unspecified: Secondary | ICD-10-CM | POA: Diagnosis not present

## 2022-04-19 DIAGNOSIS — G4730 Sleep apnea, unspecified: Secondary | ICD-10-CM | POA: Diagnosis not present

## 2022-04-19 DIAGNOSIS — R4182 Altered mental status, unspecified: Secondary | ICD-10-CM | POA: Diagnosis not present

## 2022-04-19 DIAGNOSIS — J441 Chronic obstructive pulmonary disease with (acute) exacerbation: Secondary | ICD-10-CM | POA: Diagnosis not present

## 2022-04-19 DIAGNOSIS — Z8616 Personal history of COVID-19: Secondary | ICD-10-CM | POA: Diagnosis not present

## 2022-04-19 DIAGNOSIS — I700 Atherosclerosis of aorta: Secondary | ICD-10-CM | POA: Diagnosis not present

## 2022-04-19 DIAGNOSIS — M6281 Muscle weakness (generalized): Secondary | ICD-10-CM | POA: Diagnosis not present

## 2022-04-19 DIAGNOSIS — N183 Chronic kidney disease, stage 3 unspecified: Secondary | ICD-10-CM | POA: Diagnosis not present

## 2022-04-19 DIAGNOSIS — I2510 Atherosclerotic heart disease of native coronary artery without angina pectoris: Secondary | ICD-10-CM | POA: Diagnosis not present

## 2022-04-19 DIAGNOSIS — I5032 Chronic diastolic (congestive) heart failure: Secondary | ICD-10-CM | POA: Diagnosis not present

## 2022-04-19 DIAGNOSIS — R6510 Systemic inflammatory response syndrome (SIRS) of non-infectious origin without acute organ dysfunction: Secondary | ICD-10-CM | POA: Diagnosis not present

## 2022-04-19 DIAGNOSIS — J9610 Chronic respiratory failure, unspecified whether with hypoxia or hypercapnia: Secondary | ICD-10-CM | POA: Diagnosis not present

## 2022-04-19 DIAGNOSIS — I4891 Unspecified atrial fibrillation: Secondary | ICD-10-CM | POA: Diagnosis not present

## 2022-04-20 DIAGNOSIS — D631 Anemia in chronic kidney disease: Secondary | ICD-10-CM | POA: Diagnosis not present

## 2022-04-20 DIAGNOSIS — R6510 Systemic inflammatory response syndrome (SIRS) of non-infectious origin without acute organ dysfunction: Secondary | ICD-10-CM | POA: Diagnosis not present

## 2022-04-20 DIAGNOSIS — J441 Chronic obstructive pulmonary disease with (acute) exacerbation: Secondary | ICD-10-CM | POA: Diagnosis not present

## 2022-04-20 DIAGNOSIS — M6281 Muscle weakness (generalized): Secondary | ICD-10-CM | POA: Diagnosis not present

## 2022-04-20 DIAGNOSIS — I4811 Longstanding persistent atrial fibrillation: Secondary | ICD-10-CM | POA: Diagnosis not present

## 2022-04-20 DIAGNOSIS — I10 Essential (primary) hypertension: Secondary | ICD-10-CM | POA: Diagnosis not present

## 2022-04-20 DIAGNOSIS — I5032 Chronic diastolic (congestive) heart failure: Secondary | ICD-10-CM | POA: Diagnosis not present

## 2022-04-20 DIAGNOSIS — Z8616 Personal history of COVID-19: Secondary | ICD-10-CM | POA: Diagnosis not present

## 2022-04-20 DIAGNOSIS — R4182 Altered mental status, unspecified: Secondary | ICD-10-CM | POA: Diagnosis not present

## 2022-04-22 DIAGNOSIS — I700 Atherosclerosis of aorta: Secondary | ICD-10-CM | POA: Diagnosis not present

## 2022-04-22 DIAGNOSIS — I4891 Unspecified atrial fibrillation: Secondary | ICD-10-CM | POA: Diagnosis not present

## 2022-04-22 DIAGNOSIS — I2510 Atherosclerotic heart disease of native coronary artery without angina pectoris: Secondary | ICD-10-CM | POA: Diagnosis not present

## 2022-04-22 DIAGNOSIS — Z720 Tobacco use: Secondary | ICD-10-CM | POA: Diagnosis not present

## 2022-04-22 DIAGNOSIS — I10 Essential (primary) hypertension: Secondary | ICD-10-CM | POA: Diagnosis not present

## 2022-04-22 DIAGNOSIS — I739 Peripheral vascular disease, unspecified: Secondary | ICD-10-CM | POA: Diagnosis not present

## 2022-04-22 DIAGNOSIS — D649 Anemia, unspecified: Secondary | ICD-10-CM | POA: Diagnosis not present

## 2022-04-22 DIAGNOSIS — N183 Chronic kidney disease, stage 3 unspecified: Secondary | ICD-10-CM | POA: Diagnosis not present

## 2022-04-22 DIAGNOSIS — Z8616 Personal history of COVID-19: Secondary | ICD-10-CM | POA: Diagnosis not present

## 2022-04-25 DIAGNOSIS — I10 Essential (primary) hypertension: Secondary | ICD-10-CM | POA: Diagnosis not present

## 2022-04-25 DIAGNOSIS — R6510 Systemic inflammatory response syndrome (SIRS) of non-infectious origin without acute organ dysfunction: Secondary | ICD-10-CM | POA: Diagnosis not present

## 2022-04-25 DIAGNOSIS — I4811 Longstanding persistent atrial fibrillation: Secondary | ICD-10-CM | POA: Diagnosis not present

## 2022-04-25 DIAGNOSIS — J9610 Chronic respiratory failure, unspecified whether with hypoxia or hypercapnia: Secondary | ICD-10-CM | POA: Diagnosis not present

## 2022-04-25 DIAGNOSIS — I5032 Chronic diastolic (congestive) heart failure: Secondary | ICD-10-CM | POA: Diagnosis not present

## 2022-04-25 DIAGNOSIS — R4182 Altered mental status, unspecified: Secondary | ICD-10-CM | POA: Diagnosis not present

## 2022-04-25 DIAGNOSIS — J441 Chronic obstructive pulmonary disease with (acute) exacerbation: Secondary | ICD-10-CM | POA: Diagnosis not present

## 2022-04-25 DIAGNOSIS — M6281 Muscle weakness (generalized): Secondary | ICD-10-CM | POA: Diagnosis not present

## 2022-04-25 DIAGNOSIS — Z8616 Personal history of COVID-19: Secondary | ICD-10-CM | POA: Diagnosis not present

## 2022-04-26 DIAGNOSIS — I10 Essential (primary) hypertension: Secondary | ICD-10-CM | POA: Diagnosis not present

## 2022-04-26 DIAGNOSIS — Z8616 Personal history of COVID-19: Secondary | ICD-10-CM | POA: Diagnosis not present

## 2022-04-26 DIAGNOSIS — Z720 Tobacco use: Secondary | ICD-10-CM | POA: Diagnosis not present

## 2022-04-26 DIAGNOSIS — I2510 Atherosclerotic heart disease of native coronary artery without angina pectoris: Secondary | ICD-10-CM | POA: Diagnosis not present

## 2022-04-26 DIAGNOSIS — I4891 Unspecified atrial fibrillation: Secondary | ICD-10-CM | POA: Diagnosis not present

## 2022-04-26 DIAGNOSIS — I739 Peripheral vascular disease, unspecified: Secondary | ICD-10-CM | POA: Diagnosis not present

## 2022-04-26 DIAGNOSIS — N183 Chronic kidney disease, stage 3 unspecified: Secondary | ICD-10-CM | POA: Diagnosis not present

## 2022-04-26 DIAGNOSIS — I700 Atherosclerosis of aorta: Secondary | ICD-10-CM | POA: Diagnosis not present

## 2022-04-26 DIAGNOSIS — D649 Anemia, unspecified: Secondary | ICD-10-CM | POA: Diagnosis not present

## 2022-04-27 ENCOUNTER — Ambulatory Visit: Payer: Medicare PPO | Admitting: Physician Assistant

## 2022-04-27 DIAGNOSIS — I10 Essential (primary) hypertension: Secondary | ICD-10-CM | POA: Diagnosis not present

## 2022-04-27 DIAGNOSIS — M6281 Muscle weakness (generalized): Secondary | ICD-10-CM | POA: Diagnosis not present

## 2022-04-27 DIAGNOSIS — J9610 Chronic respiratory failure, unspecified whether with hypoxia or hypercapnia: Secondary | ICD-10-CM | POA: Diagnosis not present

## 2022-04-27 DIAGNOSIS — D631 Anemia in chronic kidney disease: Secondary | ICD-10-CM | POA: Diagnosis not present

## 2022-04-27 DIAGNOSIS — I4811 Longstanding persistent atrial fibrillation: Secondary | ICD-10-CM | POA: Diagnosis not present

## 2022-04-27 DIAGNOSIS — M25561 Pain in right knee: Secondary | ICD-10-CM | POA: Diagnosis not present

## 2022-04-27 DIAGNOSIS — I5032 Chronic diastolic (congestive) heart failure: Secondary | ICD-10-CM | POA: Diagnosis not present

## 2022-04-27 DIAGNOSIS — R4182 Altered mental status, unspecified: Secondary | ICD-10-CM | POA: Diagnosis not present

## 2022-04-28 DIAGNOSIS — G4730 Sleep apnea, unspecified: Secondary | ICD-10-CM | POA: Diagnosis not present

## 2022-04-28 DIAGNOSIS — I739 Peripheral vascular disease, unspecified: Secondary | ICD-10-CM | POA: Diagnosis not present

## 2022-04-29 DIAGNOSIS — R4182 Altered mental status, unspecified: Secondary | ICD-10-CM | POA: Diagnosis not present

## 2022-04-29 DIAGNOSIS — M25561 Pain in right knee: Secondary | ICD-10-CM | POA: Diagnosis not present

## 2022-04-29 DIAGNOSIS — I4811 Longstanding persistent atrial fibrillation: Secondary | ICD-10-CM | POA: Diagnosis not present

## 2022-04-29 DIAGNOSIS — M6281 Muscle weakness (generalized): Secondary | ICD-10-CM | POA: Diagnosis not present

## 2022-04-29 DIAGNOSIS — I10 Essential (primary) hypertension: Secondary | ICD-10-CM | POA: Diagnosis not present

## 2022-04-29 DIAGNOSIS — D631 Anemia in chronic kidney disease: Secondary | ICD-10-CM | POA: Diagnosis not present

## 2022-04-29 DIAGNOSIS — J9610 Chronic respiratory failure, unspecified whether with hypoxia or hypercapnia: Secondary | ICD-10-CM | POA: Diagnosis not present

## 2022-04-29 DIAGNOSIS — I5032 Chronic diastolic (congestive) heart failure: Secondary | ICD-10-CM | POA: Diagnosis not present

## 2022-05-04 DIAGNOSIS — I739 Peripheral vascular disease, unspecified: Secondary | ICD-10-CM | POA: Diagnosis not present

## 2022-05-04 DIAGNOSIS — J9611 Chronic respiratory failure with hypoxia: Secondary | ICD-10-CM | POA: Diagnosis not present

## 2022-05-04 DIAGNOSIS — I5032 Chronic diastolic (congestive) heart failure: Secondary | ICD-10-CM | POA: Diagnosis not present

## 2022-05-04 DIAGNOSIS — I2510 Atherosclerotic heart disease of native coronary artery without angina pectoris: Secondary | ICD-10-CM | POA: Diagnosis not present

## 2022-05-04 DIAGNOSIS — I872 Venous insufficiency (chronic) (peripheral): Secondary | ICD-10-CM | POA: Diagnosis not present

## 2022-05-04 DIAGNOSIS — L89152 Pressure ulcer of sacral region, stage 2: Secondary | ICD-10-CM | POA: Diagnosis not present

## 2022-05-04 DIAGNOSIS — I4891 Unspecified atrial fibrillation: Secondary | ICD-10-CM | POA: Diagnosis not present

## 2022-05-04 DIAGNOSIS — I110 Hypertensive heart disease with heart failure: Secondary | ICD-10-CM | POA: Diagnosis not present

## 2022-05-04 DIAGNOSIS — J449 Chronic obstructive pulmonary disease, unspecified: Secondary | ICD-10-CM | POA: Diagnosis not present

## 2022-05-24 ENCOUNTER — Ambulatory Visit: Payer: Medicare PPO | Admitting: Physician Assistant

## 2022-05-25 DIAGNOSIS — J449 Chronic obstructive pulmonary disease, unspecified: Secondary | ICD-10-CM | POA: Diagnosis not present

## 2022-05-25 DIAGNOSIS — I4891 Unspecified atrial fibrillation: Secondary | ICD-10-CM | POA: Diagnosis not present

## 2022-05-25 DIAGNOSIS — J432 Centrilobular emphysema: Secondary | ICD-10-CM | POA: Diagnosis not present

## 2022-05-25 DIAGNOSIS — I872 Venous insufficiency (chronic) (peripheral): Secondary | ICD-10-CM | POA: Diagnosis not present

## 2022-05-25 DIAGNOSIS — Z111 Encounter for screening for respiratory tuberculosis: Secondary | ICD-10-CM | POA: Diagnosis not present

## 2022-05-25 DIAGNOSIS — I5032 Chronic diastolic (congestive) heart failure: Secondary | ICD-10-CM | POA: Diagnosis not present

## 2022-05-25 DIAGNOSIS — R0602 Shortness of breath: Secondary | ICD-10-CM | POA: Diagnosis not present

## 2022-05-25 DIAGNOSIS — E1142 Type 2 diabetes mellitus with diabetic polyneuropathy: Secondary | ICD-10-CM | POA: Diagnosis not present

## 2022-05-25 DIAGNOSIS — I110 Hypertensive heart disease with heart failure: Secondary | ICD-10-CM | POA: Diagnosis not present

## 2022-05-27 DIAGNOSIS — M79645 Pain in left finger(s): Secondary | ICD-10-CM | POA: Diagnosis not present

## 2022-06-21 DIAGNOSIS — I700 Atherosclerosis of aorta: Secondary | ICD-10-CM | POA: Diagnosis not present

## 2022-06-21 DIAGNOSIS — R7989 Other specified abnormal findings of blood chemistry: Secondary | ICD-10-CM | POA: Diagnosis not present

## 2022-06-21 DIAGNOSIS — I10 Essential (primary) hypertension: Secondary | ICD-10-CM | POA: Diagnosis not present

## 2022-06-21 DIAGNOSIS — I2584 Coronary atherosclerosis due to calcified coronary lesion: Secondary | ICD-10-CM | POA: Diagnosis not present

## 2022-06-21 DIAGNOSIS — I87319 Chronic venous hypertension (idiopathic) with ulcer of unspecified lower extremity: Secondary | ICD-10-CM | POA: Diagnosis not present

## 2022-06-21 DIAGNOSIS — F418 Other specified anxiety disorders: Secondary | ICD-10-CM | POA: Diagnosis not present

## 2022-06-21 DIAGNOSIS — J432 Centrilobular emphysema: Secondary | ICD-10-CM | POA: Diagnosis not present

## 2022-06-21 DIAGNOSIS — Z952 Presence of prosthetic heart valve: Secondary | ICD-10-CM | POA: Diagnosis not present

## 2022-06-21 DIAGNOSIS — N1831 Chronic kidney disease, stage 3a: Secondary | ICD-10-CM | POA: Diagnosis not present

## 2022-06-21 DIAGNOSIS — E1142 Type 2 diabetes mellitus with diabetic polyneuropathy: Secondary | ICD-10-CM | POA: Diagnosis not present

## 2022-06-21 DIAGNOSIS — I739 Peripheral vascular disease, unspecified: Secondary | ICD-10-CM | POA: Diagnosis not present

## 2022-06-21 DIAGNOSIS — E785 Hyperlipidemia, unspecified: Secondary | ICD-10-CM | POA: Diagnosis not present

## 2022-06-21 DIAGNOSIS — I4891 Unspecified atrial fibrillation: Secondary | ICD-10-CM | POA: Diagnosis not present

## 2022-06-22 ENCOUNTER — Other Ambulatory Visit (HOSPITAL_COMMUNITY): Payer: Self-pay | Admitting: Physician Assistant

## 2022-06-22 DIAGNOSIS — I4819 Other persistent atrial fibrillation: Secondary | ICD-10-CM

## 2022-06-22 NOTE — Telephone Encounter (Signed)
Eliquis '5mg'$  refill request received. Patient is 72 years old, weight-74.8kg, Crea-0.99 on 04/11/2022, Diagnosis-Afib, and last seen by [name] on 11/03/2021. Dose is appropriate based on dosing criteria. Will send in refill to requested pharmacy.

## 2022-07-04 DIAGNOSIS — M6281 Muscle weakness (generalized): Secondary | ICD-10-CM | POA: Diagnosis not present

## 2022-07-04 DIAGNOSIS — I5032 Chronic diastolic (congestive) heart failure: Secondary | ICD-10-CM | POA: Diagnosis not present

## 2022-07-04 DIAGNOSIS — J449 Chronic obstructive pulmonary disease, unspecified: Secondary | ICD-10-CM | POA: Diagnosis not present

## 2022-07-04 DIAGNOSIS — I4891 Unspecified atrial fibrillation: Secondary | ICD-10-CM | POA: Diagnosis not present

## 2022-07-07 DIAGNOSIS — I5032 Chronic diastolic (congestive) heart failure: Secondary | ICD-10-CM | POA: Diagnosis not present

## 2022-07-19 DIAGNOSIS — R2689 Other abnormalities of gait and mobility: Secondary | ICD-10-CM | POA: Diagnosis not present

## 2022-07-19 DIAGNOSIS — M6281 Muscle weakness (generalized): Secondary | ICD-10-CM | POA: Diagnosis not present

## 2022-07-19 DIAGNOSIS — R279 Unspecified lack of coordination: Secondary | ICD-10-CM | POA: Diagnosis not present

## 2022-07-23 DIAGNOSIS — M6281 Muscle weakness (generalized): Secondary | ICD-10-CM | POA: Diagnosis not present

## 2022-07-23 DIAGNOSIS — R2689 Other abnormalities of gait and mobility: Secondary | ICD-10-CM | POA: Diagnosis not present

## 2022-07-23 DIAGNOSIS — R279 Unspecified lack of coordination: Secondary | ICD-10-CM | POA: Diagnosis not present

## 2022-07-26 DIAGNOSIS — Z96651 Presence of right artificial knee joint: Secondary | ICD-10-CM | POA: Diagnosis not present

## 2022-07-26 DIAGNOSIS — M1991 Primary osteoarthritis, unspecified site: Secondary | ICD-10-CM | POA: Diagnosis not present

## 2022-07-26 DIAGNOSIS — R2689 Other abnormalities of gait and mobility: Secondary | ICD-10-CM | POA: Diagnosis not present

## 2022-07-26 DIAGNOSIS — I5032 Chronic diastolic (congestive) heart failure: Secondary | ICD-10-CM | POA: Diagnosis not present

## 2022-07-26 DIAGNOSIS — M25561 Pain in right knee: Secondary | ICD-10-CM | POA: Diagnosis not present

## 2022-07-26 DIAGNOSIS — M85861 Other specified disorders of bone density and structure, right lower leg: Secondary | ICD-10-CM | POA: Diagnosis not present

## 2022-07-26 DIAGNOSIS — M6281 Muscle weakness (generalized): Secondary | ICD-10-CM | POA: Diagnosis not present

## 2022-07-26 DIAGNOSIS — I4811 Longstanding persistent atrial fibrillation: Secondary | ICD-10-CM | POA: Diagnosis not present

## 2022-07-26 DIAGNOSIS — F339 Major depressive disorder, recurrent, unspecified: Secondary | ICD-10-CM | POA: Diagnosis not present

## 2022-07-26 DIAGNOSIS — F419 Anxiety disorder, unspecified: Secondary | ICD-10-CM | POA: Diagnosis not present

## 2022-07-26 DIAGNOSIS — R279 Unspecified lack of coordination: Secondary | ICD-10-CM | POA: Diagnosis not present

## 2022-07-26 DIAGNOSIS — J449 Chronic obstructive pulmonary disease, unspecified: Secondary | ICD-10-CM | POA: Diagnosis not present

## 2022-07-26 DIAGNOSIS — E785 Hyperlipidemia, unspecified: Secondary | ICD-10-CM | POA: Diagnosis not present

## 2022-07-26 DIAGNOSIS — I10 Essential (primary) hypertension: Secondary | ICD-10-CM | POA: Diagnosis not present

## 2022-07-28 DIAGNOSIS — M62511 Muscle wasting and atrophy, not elsewhere classified, right shoulder: Secondary | ICD-10-CM | POA: Diagnosis not present

## 2022-07-28 DIAGNOSIS — R278 Other lack of coordination: Secondary | ICD-10-CM | POA: Diagnosis not present

## 2022-07-28 DIAGNOSIS — R2681 Unsteadiness on feet: Secondary | ICD-10-CM | POA: Diagnosis not present

## 2022-07-28 DIAGNOSIS — M62512 Muscle wasting and atrophy, not elsewhere classified, left shoulder: Secondary | ICD-10-CM | POA: Diagnosis not present

## 2022-07-28 DIAGNOSIS — M25561 Pain in right knee: Secondary | ICD-10-CM | POA: Diagnosis not present

## 2022-07-28 DIAGNOSIS — M25511 Pain in right shoulder: Secondary | ICD-10-CM | POA: Diagnosis not present

## 2022-08-01 DIAGNOSIS — R279 Unspecified lack of coordination: Secondary | ICD-10-CM | POA: Diagnosis not present

## 2022-08-01 DIAGNOSIS — M6281 Muscle weakness (generalized): Secondary | ICD-10-CM | POA: Diagnosis not present

## 2022-08-01 DIAGNOSIS — R2689 Other abnormalities of gait and mobility: Secondary | ICD-10-CM | POA: Diagnosis not present

## 2022-08-02 DIAGNOSIS — R278 Other lack of coordination: Secondary | ICD-10-CM | POA: Diagnosis not present

## 2022-08-02 DIAGNOSIS — M62511 Muscle wasting and atrophy, not elsewhere classified, right shoulder: Secondary | ICD-10-CM | POA: Diagnosis not present

## 2022-08-02 DIAGNOSIS — I5032 Chronic diastolic (congestive) heart failure: Secondary | ICD-10-CM | POA: Diagnosis not present

## 2022-08-02 DIAGNOSIS — M25511 Pain in right shoulder: Secondary | ICD-10-CM | POA: Diagnosis not present

## 2022-08-02 DIAGNOSIS — M62512 Muscle wasting and atrophy, not elsewhere classified, left shoulder: Secondary | ICD-10-CM | POA: Diagnosis not present

## 2022-08-02 DIAGNOSIS — M25561 Pain in right knee: Secondary | ICD-10-CM | POA: Diagnosis not present

## 2022-08-02 DIAGNOSIS — M1991 Primary osteoarthritis, unspecified site: Secondary | ICD-10-CM | POA: Diagnosis not present

## 2022-08-02 DIAGNOSIS — M6281 Muscle weakness (generalized): Secondary | ICD-10-CM | POA: Diagnosis not present

## 2022-08-02 DIAGNOSIS — R2681 Unsteadiness on feet: Secondary | ICD-10-CM | POA: Diagnosis not present

## 2022-08-04 DIAGNOSIS — J449 Chronic obstructive pulmonary disease, unspecified: Secondary | ICD-10-CM | POA: Diagnosis not present

## 2022-08-04 DIAGNOSIS — M6281 Muscle weakness (generalized): Secondary | ICD-10-CM | POA: Diagnosis not present

## 2022-08-04 DIAGNOSIS — I5032 Chronic diastolic (congestive) heart failure: Secondary | ICD-10-CM | POA: Diagnosis not present

## 2022-08-04 DIAGNOSIS — I4891 Unspecified atrial fibrillation: Secondary | ICD-10-CM | POA: Diagnosis not present

## 2022-08-05 DIAGNOSIS — M25561 Pain in right knee: Secondary | ICD-10-CM | POA: Diagnosis not present

## 2022-08-05 DIAGNOSIS — M25511 Pain in right shoulder: Secondary | ICD-10-CM | POA: Diagnosis not present

## 2022-08-05 DIAGNOSIS — R2681 Unsteadiness on feet: Secondary | ICD-10-CM | POA: Diagnosis not present

## 2022-08-05 DIAGNOSIS — M62512 Muscle wasting and atrophy, not elsewhere classified, left shoulder: Secondary | ICD-10-CM | POA: Diagnosis not present

## 2022-08-05 DIAGNOSIS — M62511 Muscle wasting and atrophy, not elsewhere classified, right shoulder: Secondary | ICD-10-CM | POA: Diagnosis not present

## 2022-08-05 DIAGNOSIS — R278 Other lack of coordination: Secondary | ICD-10-CM | POA: Diagnosis not present

## 2022-08-06 DIAGNOSIS — M6281 Muscle weakness (generalized): Secondary | ICD-10-CM | POA: Diagnosis not present

## 2022-08-06 DIAGNOSIS — R279 Unspecified lack of coordination: Secondary | ICD-10-CM | POA: Diagnosis not present

## 2022-08-06 DIAGNOSIS — R2689 Other abnormalities of gait and mobility: Secondary | ICD-10-CM | POA: Diagnosis not present

## 2022-08-07 DIAGNOSIS — I5032 Chronic diastolic (congestive) heart failure: Secondary | ICD-10-CM | POA: Diagnosis not present

## 2022-08-09 DIAGNOSIS — M25511 Pain in right shoulder: Secondary | ICD-10-CM | POA: Diagnosis not present

## 2022-08-09 DIAGNOSIS — R278 Other lack of coordination: Secondary | ICD-10-CM | POA: Diagnosis not present

## 2022-08-09 DIAGNOSIS — M25561 Pain in right knee: Secondary | ICD-10-CM | POA: Diagnosis not present

## 2022-08-09 DIAGNOSIS — M62512 Muscle wasting and atrophy, not elsewhere classified, left shoulder: Secondary | ICD-10-CM | POA: Diagnosis not present

## 2022-08-09 DIAGNOSIS — R2689 Other abnormalities of gait and mobility: Secondary | ICD-10-CM | POA: Diagnosis not present

## 2022-08-09 DIAGNOSIS — R2681 Unsteadiness on feet: Secondary | ICD-10-CM | POA: Diagnosis not present

## 2022-08-09 DIAGNOSIS — M62511 Muscle wasting and atrophy, not elsewhere classified, right shoulder: Secondary | ICD-10-CM | POA: Diagnosis not present

## 2022-08-09 DIAGNOSIS — M6281 Muscle weakness (generalized): Secondary | ICD-10-CM | POA: Diagnosis not present

## 2022-08-09 DIAGNOSIS — R279 Unspecified lack of coordination: Secondary | ICD-10-CM | POA: Diagnosis not present

## 2022-08-10 DIAGNOSIS — M25511 Pain in right shoulder: Secondary | ICD-10-CM | POA: Diagnosis not present

## 2022-08-10 DIAGNOSIS — M25561 Pain in right knee: Secondary | ICD-10-CM | POA: Diagnosis not present

## 2022-08-10 DIAGNOSIS — M62511 Muscle wasting and atrophy, not elsewhere classified, right shoulder: Secondary | ICD-10-CM | POA: Diagnosis not present

## 2022-08-10 DIAGNOSIS — R278 Other lack of coordination: Secondary | ICD-10-CM | POA: Diagnosis not present

## 2022-08-10 DIAGNOSIS — R2681 Unsteadiness on feet: Secondary | ICD-10-CM | POA: Diagnosis not present

## 2022-08-10 DIAGNOSIS — M62512 Muscle wasting and atrophy, not elsewhere classified, left shoulder: Secondary | ICD-10-CM | POA: Diagnosis not present

## 2022-08-11 DIAGNOSIS — M25561 Pain in right knee: Secondary | ICD-10-CM | POA: Diagnosis not present

## 2022-08-11 DIAGNOSIS — R278 Other lack of coordination: Secondary | ICD-10-CM | POA: Diagnosis not present

## 2022-08-11 DIAGNOSIS — M62511 Muscle wasting and atrophy, not elsewhere classified, right shoulder: Secondary | ICD-10-CM | POA: Diagnosis not present

## 2022-08-11 DIAGNOSIS — R2681 Unsteadiness on feet: Secondary | ICD-10-CM | POA: Diagnosis not present

## 2022-08-11 DIAGNOSIS — M25511 Pain in right shoulder: Secondary | ICD-10-CM | POA: Diagnosis not present

## 2022-08-11 DIAGNOSIS — M62512 Muscle wasting and atrophy, not elsewhere classified, left shoulder: Secondary | ICD-10-CM | POA: Diagnosis not present

## 2022-08-13 DIAGNOSIS — M6281 Muscle weakness (generalized): Secondary | ICD-10-CM | POA: Diagnosis not present

## 2022-08-13 DIAGNOSIS — R279 Unspecified lack of coordination: Secondary | ICD-10-CM | POA: Diagnosis not present

## 2022-08-13 DIAGNOSIS — R2689 Other abnormalities of gait and mobility: Secondary | ICD-10-CM | POA: Diagnosis not present

## 2022-08-15 DIAGNOSIS — R278 Other lack of coordination: Secondary | ICD-10-CM | POA: Diagnosis not present

## 2022-08-15 DIAGNOSIS — M62511 Muscle wasting and atrophy, not elsewhere classified, right shoulder: Secondary | ICD-10-CM | POA: Diagnosis not present

## 2022-08-15 DIAGNOSIS — M25511 Pain in right shoulder: Secondary | ICD-10-CM | POA: Diagnosis not present

## 2022-08-15 DIAGNOSIS — R2681 Unsteadiness on feet: Secondary | ICD-10-CM | POA: Diagnosis not present

## 2022-08-15 DIAGNOSIS — M62512 Muscle wasting and atrophy, not elsewhere classified, left shoulder: Secondary | ICD-10-CM | POA: Diagnosis not present

## 2022-08-15 DIAGNOSIS — M25561 Pain in right knee: Secondary | ICD-10-CM | POA: Diagnosis not present

## 2022-08-16 DIAGNOSIS — R279 Unspecified lack of coordination: Secondary | ICD-10-CM | POA: Diagnosis not present

## 2022-08-16 DIAGNOSIS — R2689 Other abnormalities of gait and mobility: Secondary | ICD-10-CM | POA: Diagnosis not present

## 2022-08-16 DIAGNOSIS — M6281 Muscle weakness (generalized): Secondary | ICD-10-CM | POA: Diagnosis not present

## 2022-08-17 DIAGNOSIS — E782 Mixed hyperlipidemia: Secondary | ICD-10-CM | POA: Diagnosis not present

## 2022-08-17 DIAGNOSIS — R2681 Unsteadiness on feet: Secondary | ICD-10-CM | POA: Diagnosis not present

## 2022-08-17 DIAGNOSIS — D508 Other iron deficiency anemias: Secondary | ICD-10-CM | POA: Diagnosis not present

## 2022-08-17 DIAGNOSIS — R278 Other lack of coordination: Secondary | ICD-10-CM | POA: Diagnosis not present

## 2022-08-17 DIAGNOSIS — M62511 Muscle wasting and atrophy, not elsewhere classified, right shoulder: Secondary | ICD-10-CM | POA: Diagnosis not present

## 2022-08-17 DIAGNOSIS — M25561 Pain in right knee: Secondary | ICD-10-CM | POA: Diagnosis not present

## 2022-08-17 DIAGNOSIS — M25511 Pain in right shoulder: Secondary | ICD-10-CM | POA: Diagnosis not present

## 2022-08-17 DIAGNOSIS — M62512 Muscle wasting and atrophy, not elsewhere classified, left shoulder: Secondary | ICD-10-CM | POA: Diagnosis not present

## 2022-08-20 DIAGNOSIS — M6281 Muscle weakness (generalized): Secondary | ICD-10-CM | POA: Diagnosis not present

## 2022-08-20 DIAGNOSIS — R279 Unspecified lack of coordination: Secondary | ICD-10-CM | POA: Diagnosis not present

## 2022-08-20 DIAGNOSIS — R2689 Other abnormalities of gait and mobility: Secondary | ICD-10-CM | POA: Diagnosis not present

## 2022-08-23 DIAGNOSIS — R279 Unspecified lack of coordination: Secondary | ICD-10-CM | POA: Diagnosis not present

## 2022-08-23 DIAGNOSIS — R2689 Other abnormalities of gait and mobility: Secondary | ICD-10-CM | POA: Diagnosis not present

## 2022-08-23 DIAGNOSIS — M6281 Muscle weakness (generalized): Secondary | ICD-10-CM | POA: Diagnosis not present

## 2022-08-25 DIAGNOSIS — Z7901 Long term (current) use of anticoagulants: Secondary | ICD-10-CM | POA: Diagnosis not present

## 2022-08-25 DIAGNOSIS — R2681 Unsteadiness on feet: Secondary | ICD-10-CM | POA: Diagnosis not present

## 2022-08-25 DIAGNOSIS — R278 Other lack of coordination: Secondary | ICD-10-CM | POA: Diagnosis not present

## 2022-08-25 DIAGNOSIS — M25561 Pain in right knee: Secondary | ICD-10-CM | POA: Diagnosis not present

## 2022-08-25 DIAGNOSIS — I5032 Chronic diastolic (congestive) heart failure: Secondary | ICD-10-CM | POA: Diagnosis not present

## 2022-08-25 DIAGNOSIS — D508 Other iron deficiency anemias: Secondary | ICD-10-CM | POA: Diagnosis not present

## 2022-08-25 DIAGNOSIS — E782 Mixed hyperlipidemia: Secondary | ICD-10-CM | POA: Diagnosis not present

## 2022-08-25 DIAGNOSIS — M62511 Muscle wasting and atrophy, not elsewhere classified, right shoulder: Secondary | ICD-10-CM | POA: Diagnosis not present

## 2022-08-25 DIAGNOSIS — I110 Hypertensive heart disease with heart failure: Secondary | ICD-10-CM | POA: Diagnosis not present

## 2022-08-25 DIAGNOSIS — I482 Chronic atrial fibrillation, unspecified: Secondary | ICD-10-CM | POA: Diagnosis not present

## 2022-08-25 DIAGNOSIS — M25511 Pain in right shoulder: Secondary | ICD-10-CM | POA: Diagnosis not present

## 2022-08-25 DIAGNOSIS — M62512 Muscle wasting and atrophy, not elsewhere classified, left shoulder: Secondary | ICD-10-CM | POA: Diagnosis not present

## 2022-08-25 DIAGNOSIS — K219 Gastro-esophageal reflux disease without esophagitis: Secondary | ICD-10-CM | POA: Diagnosis not present

## 2022-08-26 DIAGNOSIS — M6281 Muscle weakness (generalized): Secondary | ICD-10-CM | POA: Diagnosis not present

## 2022-08-26 DIAGNOSIS — R279 Unspecified lack of coordination: Secondary | ICD-10-CM | POA: Diagnosis not present

## 2022-08-26 DIAGNOSIS — R2689 Other abnormalities of gait and mobility: Secondary | ICD-10-CM | POA: Diagnosis not present

## 2022-08-29 DIAGNOSIS — R2689 Other abnormalities of gait and mobility: Secondary | ICD-10-CM | POA: Diagnosis not present

## 2022-08-29 DIAGNOSIS — M6281 Muscle weakness (generalized): Secondary | ICD-10-CM | POA: Diagnosis not present

## 2022-08-29 DIAGNOSIS — R279 Unspecified lack of coordination: Secondary | ICD-10-CM | POA: Diagnosis not present

## 2022-08-30 DIAGNOSIS — M62512 Muscle wasting and atrophy, not elsewhere classified, left shoulder: Secondary | ICD-10-CM | POA: Diagnosis not present

## 2022-08-30 DIAGNOSIS — I739 Peripheral vascular disease, unspecified: Secondary | ICD-10-CM | POA: Diagnosis not present

## 2022-08-30 DIAGNOSIS — R278 Other lack of coordination: Secondary | ICD-10-CM | POA: Diagnosis not present

## 2022-08-30 DIAGNOSIS — I10 Essential (primary) hypertension: Secondary | ICD-10-CM | POA: Diagnosis not present

## 2022-08-30 DIAGNOSIS — M25511 Pain in right shoulder: Secondary | ICD-10-CM | POA: Diagnosis not present

## 2022-08-30 DIAGNOSIS — I4811 Longstanding persistent atrial fibrillation: Secondary | ICD-10-CM | POA: Diagnosis not present

## 2022-08-30 DIAGNOSIS — R2681 Unsteadiness on feet: Secondary | ICD-10-CM | POA: Diagnosis not present

## 2022-08-30 DIAGNOSIS — M62511 Muscle wasting and atrophy, not elsewhere classified, right shoulder: Secondary | ICD-10-CM | POA: Diagnosis not present

## 2022-08-30 DIAGNOSIS — M25561 Pain in right knee: Secondary | ICD-10-CM | POA: Diagnosis not present

## 2022-08-31 DIAGNOSIS — M6281 Muscle weakness (generalized): Secondary | ICD-10-CM | POA: Diagnosis not present

## 2022-08-31 DIAGNOSIS — R279 Unspecified lack of coordination: Secondary | ICD-10-CM | POA: Diagnosis not present

## 2022-08-31 DIAGNOSIS — R2689 Other abnormalities of gait and mobility: Secondary | ICD-10-CM | POA: Diagnosis not present

## 2022-09-01 DIAGNOSIS — M25561 Pain in right knee: Secondary | ICD-10-CM | POA: Diagnosis not present

## 2022-09-01 DIAGNOSIS — R278 Other lack of coordination: Secondary | ICD-10-CM | POA: Diagnosis not present

## 2022-09-01 DIAGNOSIS — M25511 Pain in right shoulder: Secondary | ICD-10-CM | POA: Diagnosis not present

## 2022-09-01 DIAGNOSIS — M62512 Muscle wasting and atrophy, not elsewhere classified, left shoulder: Secondary | ICD-10-CM | POA: Diagnosis not present

## 2022-09-01 DIAGNOSIS — M62511 Muscle wasting and atrophy, not elsewhere classified, right shoulder: Secondary | ICD-10-CM | POA: Diagnosis not present

## 2022-09-01 DIAGNOSIS — R2681 Unsteadiness on feet: Secondary | ICD-10-CM | POA: Diagnosis not present

## 2022-09-02 DIAGNOSIS — M6281 Muscle weakness (generalized): Secondary | ICD-10-CM | POA: Diagnosis not present

## 2022-09-02 DIAGNOSIS — I5032 Chronic diastolic (congestive) heart failure: Secondary | ICD-10-CM | POA: Diagnosis not present

## 2022-09-02 DIAGNOSIS — J449 Chronic obstructive pulmonary disease, unspecified: Secondary | ICD-10-CM | POA: Diagnosis not present

## 2022-09-02 DIAGNOSIS — I4891 Unspecified atrial fibrillation: Secondary | ICD-10-CM | POA: Diagnosis not present

## 2022-09-05 DIAGNOSIS — I5032 Chronic diastolic (congestive) heart failure: Secondary | ICD-10-CM | POA: Diagnosis not present

## 2022-09-05 DIAGNOSIS — R2681 Unsteadiness on feet: Secondary | ICD-10-CM | POA: Diagnosis not present

## 2022-09-05 DIAGNOSIS — R278 Other lack of coordination: Secondary | ICD-10-CM | POA: Diagnosis not present

## 2022-09-05 DIAGNOSIS — M25561 Pain in right knee: Secondary | ICD-10-CM | POA: Diagnosis not present

## 2022-09-05 DIAGNOSIS — M62512 Muscle wasting and atrophy, not elsewhere classified, left shoulder: Secondary | ICD-10-CM | POA: Diagnosis not present

## 2022-09-05 DIAGNOSIS — M25511 Pain in right shoulder: Secondary | ICD-10-CM | POA: Diagnosis not present

## 2022-09-05 DIAGNOSIS — M62511 Muscle wasting and atrophy, not elsewhere classified, right shoulder: Secondary | ICD-10-CM | POA: Diagnosis not present

## 2022-09-06 DIAGNOSIS — R2689 Other abnormalities of gait and mobility: Secondary | ICD-10-CM | POA: Diagnosis not present

## 2022-09-06 DIAGNOSIS — R279 Unspecified lack of coordination: Secondary | ICD-10-CM | POA: Diagnosis not present

## 2022-09-06 DIAGNOSIS — M6281 Muscle weakness (generalized): Secondary | ICD-10-CM | POA: Diagnosis not present

## 2022-09-08 DIAGNOSIS — M6281 Muscle weakness (generalized): Secondary | ICD-10-CM | POA: Diagnosis not present

## 2022-09-08 DIAGNOSIS — R2689 Other abnormalities of gait and mobility: Secondary | ICD-10-CM | POA: Diagnosis not present

## 2022-09-08 DIAGNOSIS — R279 Unspecified lack of coordination: Secondary | ICD-10-CM | POA: Diagnosis not present

## 2022-09-09 DIAGNOSIS — M62512 Muscle wasting and atrophy, not elsewhere classified, left shoulder: Secondary | ICD-10-CM | POA: Diagnosis not present

## 2022-09-09 DIAGNOSIS — M25561 Pain in right knee: Secondary | ICD-10-CM | POA: Diagnosis not present

## 2022-09-09 DIAGNOSIS — R2681 Unsteadiness on feet: Secondary | ICD-10-CM | POA: Diagnosis not present

## 2022-09-09 DIAGNOSIS — R278 Other lack of coordination: Secondary | ICD-10-CM | POA: Diagnosis not present

## 2022-09-09 DIAGNOSIS — M62511 Muscle wasting and atrophy, not elsewhere classified, right shoulder: Secondary | ICD-10-CM | POA: Diagnosis not present

## 2022-09-09 DIAGNOSIS — M25511 Pain in right shoulder: Secondary | ICD-10-CM | POA: Diagnosis not present

## 2022-09-12 DIAGNOSIS — R2681 Unsteadiness on feet: Secondary | ICD-10-CM | POA: Diagnosis not present

## 2022-09-12 DIAGNOSIS — M62511 Muscle wasting and atrophy, not elsewhere classified, right shoulder: Secondary | ICD-10-CM | POA: Diagnosis not present

## 2022-09-12 DIAGNOSIS — M25511 Pain in right shoulder: Secondary | ICD-10-CM | POA: Diagnosis not present

## 2022-09-12 DIAGNOSIS — M62512 Muscle wasting and atrophy, not elsewhere classified, left shoulder: Secondary | ICD-10-CM | POA: Diagnosis not present

## 2022-09-12 DIAGNOSIS — R278 Other lack of coordination: Secondary | ICD-10-CM | POA: Diagnosis not present

## 2022-09-12 DIAGNOSIS — M25561 Pain in right knee: Secondary | ICD-10-CM | POA: Diagnosis not present

## 2022-09-13 DIAGNOSIS — M6281 Muscle weakness (generalized): Secondary | ICD-10-CM | POA: Diagnosis not present

## 2022-09-13 DIAGNOSIS — R2689 Other abnormalities of gait and mobility: Secondary | ICD-10-CM | POA: Diagnosis not present

## 2022-09-13 DIAGNOSIS — R279 Unspecified lack of coordination: Secondary | ICD-10-CM | POA: Diagnosis not present

## 2022-09-14 DIAGNOSIS — M6281 Muscle weakness (generalized): Secondary | ICD-10-CM | POA: Diagnosis not present

## 2022-09-14 DIAGNOSIS — R2689 Other abnormalities of gait and mobility: Secondary | ICD-10-CM | POA: Diagnosis not present

## 2022-09-14 DIAGNOSIS — R279 Unspecified lack of coordination: Secondary | ICD-10-CM | POA: Diagnosis not present

## 2022-09-15 DIAGNOSIS — E782 Mixed hyperlipidemia: Secondary | ICD-10-CM | POA: Diagnosis not present

## 2022-09-15 DIAGNOSIS — D508 Other iron deficiency anemias: Secondary | ICD-10-CM | POA: Diagnosis not present

## 2022-09-16 DIAGNOSIS — M62511 Muscle wasting and atrophy, not elsewhere classified, right shoulder: Secondary | ICD-10-CM | POA: Diagnosis not present

## 2022-09-16 DIAGNOSIS — M25561 Pain in right knee: Secondary | ICD-10-CM | POA: Diagnosis not present

## 2022-09-16 DIAGNOSIS — M25511 Pain in right shoulder: Secondary | ICD-10-CM | POA: Diagnosis not present

## 2022-09-16 DIAGNOSIS — R278 Other lack of coordination: Secondary | ICD-10-CM | POA: Diagnosis not present

## 2022-09-16 DIAGNOSIS — M62512 Muscle wasting and atrophy, not elsewhere classified, left shoulder: Secondary | ICD-10-CM | POA: Diagnosis not present

## 2022-09-16 DIAGNOSIS — R2681 Unsteadiness on feet: Secondary | ICD-10-CM | POA: Diagnosis not present

## 2022-09-19 DIAGNOSIS — M62512 Muscle wasting and atrophy, not elsewhere classified, left shoulder: Secondary | ICD-10-CM | POA: Diagnosis not present

## 2022-09-19 DIAGNOSIS — M62511 Muscle wasting and atrophy, not elsewhere classified, right shoulder: Secondary | ICD-10-CM | POA: Diagnosis not present

## 2022-09-19 DIAGNOSIS — R278 Other lack of coordination: Secondary | ICD-10-CM | POA: Diagnosis not present

## 2022-09-19 DIAGNOSIS — R2681 Unsteadiness on feet: Secondary | ICD-10-CM | POA: Diagnosis not present

## 2022-09-19 DIAGNOSIS — M25561 Pain in right knee: Secondary | ICD-10-CM | POA: Diagnosis not present

## 2022-09-19 DIAGNOSIS — M25511 Pain in right shoulder: Secondary | ICD-10-CM | POA: Diagnosis not present

## 2022-09-20 DIAGNOSIS — M6281 Muscle weakness (generalized): Secondary | ICD-10-CM | POA: Diagnosis not present

## 2022-09-20 DIAGNOSIS — R279 Unspecified lack of coordination: Secondary | ICD-10-CM | POA: Diagnosis not present

## 2022-09-20 DIAGNOSIS — R2689 Other abnormalities of gait and mobility: Secondary | ICD-10-CM | POA: Diagnosis not present

## 2022-09-20 DIAGNOSIS — R2681 Unsteadiness on feet: Secondary | ICD-10-CM | POA: Diagnosis not present

## 2022-09-20 DIAGNOSIS — M6259 Muscle wasting and atrophy, not elsewhere classified, multiple sites: Secondary | ICD-10-CM | POA: Diagnosis not present

## 2022-09-21 DIAGNOSIS — M62511 Muscle wasting and atrophy, not elsewhere classified, right shoulder: Secondary | ICD-10-CM | POA: Diagnosis not present

## 2022-09-21 DIAGNOSIS — R278 Other lack of coordination: Secondary | ICD-10-CM | POA: Diagnosis not present

## 2022-09-21 DIAGNOSIS — R2681 Unsteadiness on feet: Secondary | ICD-10-CM | POA: Diagnosis not present

## 2022-09-21 DIAGNOSIS — M25561 Pain in right knee: Secondary | ICD-10-CM | POA: Diagnosis not present

## 2022-09-21 DIAGNOSIS — M62512 Muscle wasting and atrophy, not elsewhere classified, left shoulder: Secondary | ICD-10-CM | POA: Diagnosis not present

## 2022-09-21 DIAGNOSIS — M25511 Pain in right shoulder: Secondary | ICD-10-CM | POA: Diagnosis not present

## 2022-09-22 DIAGNOSIS — R279 Unspecified lack of coordination: Secondary | ICD-10-CM | POA: Diagnosis not present

## 2022-09-22 DIAGNOSIS — M6259 Muscle wasting and atrophy, not elsewhere classified, multiple sites: Secondary | ICD-10-CM | POA: Diagnosis not present

## 2022-09-22 DIAGNOSIS — R2689 Other abnormalities of gait and mobility: Secondary | ICD-10-CM | POA: Diagnosis not present

## 2022-09-22 DIAGNOSIS — R2681 Unsteadiness on feet: Secondary | ICD-10-CM | POA: Diagnosis not present

## 2022-09-22 DIAGNOSIS — M6281 Muscle weakness (generalized): Secondary | ICD-10-CM | POA: Diagnosis not present

## 2022-09-25 DIAGNOSIS — M6281 Muscle weakness (generalized): Secondary | ICD-10-CM | POA: Diagnosis not present

## 2022-09-25 DIAGNOSIS — R2681 Unsteadiness on feet: Secondary | ICD-10-CM | POA: Diagnosis not present

## 2022-09-25 DIAGNOSIS — M6259 Muscle wasting and atrophy, not elsewhere classified, multiple sites: Secondary | ICD-10-CM | POA: Diagnosis not present

## 2022-09-25 DIAGNOSIS — R2689 Other abnormalities of gait and mobility: Secondary | ICD-10-CM | POA: Diagnosis not present

## 2022-09-25 DIAGNOSIS — R279 Unspecified lack of coordination: Secondary | ICD-10-CM | POA: Diagnosis not present

## 2022-09-26 DIAGNOSIS — M62512 Muscle wasting and atrophy, not elsewhere classified, left shoulder: Secondary | ICD-10-CM | POA: Diagnosis not present

## 2022-09-26 DIAGNOSIS — M25561 Pain in right knee: Secondary | ICD-10-CM | POA: Diagnosis not present

## 2022-09-26 DIAGNOSIS — R278 Other lack of coordination: Secondary | ICD-10-CM | POA: Diagnosis not present

## 2022-09-26 DIAGNOSIS — R2681 Unsteadiness on feet: Secondary | ICD-10-CM | POA: Diagnosis not present

## 2022-09-26 DIAGNOSIS — M62511 Muscle wasting and atrophy, not elsewhere classified, right shoulder: Secondary | ICD-10-CM | POA: Diagnosis not present

## 2022-09-26 DIAGNOSIS — M25511 Pain in right shoulder: Secondary | ICD-10-CM | POA: Diagnosis not present

## 2022-09-27 DIAGNOSIS — M62511 Muscle wasting and atrophy, not elsewhere classified, right shoulder: Secondary | ICD-10-CM | POA: Diagnosis not present

## 2022-09-27 DIAGNOSIS — M25561 Pain in right knee: Secondary | ICD-10-CM | POA: Diagnosis not present

## 2022-09-27 DIAGNOSIS — M62512 Muscle wasting and atrophy, not elsewhere classified, left shoulder: Secondary | ICD-10-CM | POA: Diagnosis not present

## 2022-09-27 DIAGNOSIS — I739 Peripheral vascular disease, unspecified: Secondary | ICD-10-CM | POA: Diagnosis not present

## 2022-09-27 DIAGNOSIS — R2681 Unsteadiness on feet: Secondary | ICD-10-CM | POA: Diagnosis not present

## 2022-09-27 DIAGNOSIS — M25511 Pain in right shoulder: Secondary | ICD-10-CM | POA: Diagnosis not present

## 2022-09-27 DIAGNOSIS — J449 Chronic obstructive pulmonary disease, unspecified: Secondary | ICD-10-CM | POA: Diagnosis not present

## 2022-09-27 DIAGNOSIS — I4811 Longstanding persistent atrial fibrillation: Secondary | ICD-10-CM | POA: Diagnosis not present

## 2022-09-27 DIAGNOSIS — R278 Other lack of coordination: Secondary | ICD-10-CM | POA: Diagnosis not present

## 2022-09-28 DIAGNOSIS — R279 Unspecified lack of coordination: Secondary | ICD-10-CM | POA: Diagnosis not present

## 2022-09-28 DIAGNOSIS — M6281 Muscle weakness (generalized): Secondary | ICD-10-CM | POA: Diagnosis not present

## 2022-09-28 DIAGNOSIS — M6259 Muscle wasting and atrophy, not elsewhere classified, multiple sites: Secondary | ICD-10-CM | POA: Diagnosis not present

## 2022-09-28 DIAGNOSIS — R2689 Other abnormalities of gait and mobility: Secondary | ICD-10-CM | POA: Diagnosis not present

## 2022-09-28 DIAGNOSIS — R2681 Unsteadiness on feet: Secondary | ICD-10-CM | POA: Diagnosis not present

## 2022-09-29 DIAGNOSIS — M62511 Muscle wasting and atrophy, not elsewhere classified, right shoulder: Secondary | ICD-10-CM | POA: Diagnosis not present

## 2022-09-29 DIAGNOSIS — M25511 Pain in right shoulder: Secondary | ICD-10-CM | POA: Diagnosis not present

## 2022-09-29 DIAGNOSIS — M25561 Pain in right knee: Secondary | ICD-10-CM | POA: Diagnosis not present

## 2022-09-29 DIAGNOSIS — M62512 Muscle wasting and atrophy, not elsewhere classified, left shoulder: Secondary | ICD-10-CM | POA: Diagnosis not present

## 2022-09-29 DIAGNOSIS — R278 Other lack of coordination: Secondary | ICD-10-CM | POA: Diagnosis not present

## 2022-09-29 DIAGNOSIS — R2681 Unsteadiness on feet: Secondary | ICD-10-CM | POA: Diagnosis not present

## 2022-10-03 DIAGNOSIS — I4891 Unspecified atrial fibrillation: Secondary | ICD-10-CM | POA: Diagnosis not present

## 2022-10-03 DIAGNOSIS — R2681 Unsteadiness on feet: Secondary | ICD-10-CM | POA: Diagnosis not present

## 2022-10-03 DIAGNOSIS — R279 Unspecified lack of coordination: Secondary | ICD-10-CM | POA: Diagnosis not present

## 2022-10-03 DIAGNOSIS — R2689 Other abnormalities of gait and mobility: Secondary | ICD-10-CM | POA: Diagnosis not present

## 2022-10-03 DIAGNOSIS — J449 Chronic obstructive pulmonary disease, unspecified: Secondary | ICD-10-CM | POA: Diagnosis not present

## 2022-10-03 DIAGNOSIS — M6281 Muscle weakness (generalized): Secondary | ICD-10-CM | POA: Diagnosis not present

## 2022-10-03 DIAGNOSIS — I5032 Chronic diastolic (congestive) heart failure: Secondary | ICD-10-CM | POA: Diagnosis not present

## 2022-10-03 DIAGNOSIS — M6259 Muscle wasting and atrophy, not elsewhere classified, multiple sites: Secondary | ICD-10-CM | POA: Diagnosis not present

## 2022-10-04 DIAGNOSIS — R278 Other lack of coordination: Secondary | ICD-10-CM | POA: Diagnosis not present

## 2022-10-04 DIAGNOSIS — M62512 Muscle wasting and atrophy, not elsewhere classified, left shoulder: Secondary | ICD-10-CM | POA: Diagnosis not present

## 2022-10-04 DIAGNOSIS — M25561 Pain in right knee: Secondary | ICD-10-CM | POA: Diagnosis not present

## 2022-10-04 DIAGNOSIS — R2681 Unsteadiness on feet: Secondary | ICD-10-CM | POA: Diagnosis not present

## 2022-10-04 DIAGNOSIS — M25511 Pain in right shoulder: Secondary | ICD-10-CM | POA: Diagnosis not present

## 2022-10-04 DIAGNOSIS — M62511 Muscle wasting and atrophy, not elsewhere classified, right shoulder: Secondary | ICD-10-CM | POA: Diagnosis not present

## 2022-10-05 DIAGNOSIS — M25561 Pain in right knee: Secondary | ICD-10-CM | POA: Diagnosis not present

## 2022-10-05 DIAGNOSIS — R2681 Unsteadiness on feet: Secondary | ICD-10-CM | POA: Diagnosis not present

## 2022-10-05 DIAGNOSIS — M25511 Pain in right shoulder: Secondary | ICD-10-CM | POA: Diagnosis not present

## 2022-10-05 DIAGNOSIS — M62512 Muscle wasting and atrophy, not elsewhere classified, left shoulder: Secondary | ICD-10-CM | POA: Diagnosis not present

## 2022-10-05 DIAGNOSIS — M62511 Muscle wasting and atrophy, not elsewhere classified, right shoulder: Secondary | ICD-10-CM | POA: Diagnosis not present

## 2022-10-05 DIAGNOSIS — R278 Other lack of coordination: Secondary | ICD-10-CM | POA: Diagnosis not present

## 2022-10-06 DIAGNOSIS — R279 Unspecified lack of coordination: Secondary | ICD-10-CM | POA: Diagnosis not present

## 2022-10-06 DIAGNOSIS — R2689 Other abnormalities of gait and mobility: Secondary | ICD-10-CM | POA: Diagnosis not present

## 2022-10-06 DIAGNOSIS — M6281 Muscle weakness (generalized): Secondary | ICD-10-CM | POA: Diagnosis not present

## 2022-10-06 DIAGNOSIS — M6259 Muscle wasting and atrophy, not elsewhere classified, multiple sites: Secondary | ICD-10-CM | POA: Diagnosis not present

## 2022-10-06 DIAGNOSIS — I5032 Chronic diastolic (congestive) heart failure: Secondary | ICD-10-CM | POA: Diagnosis not present

## 2022-10-06 DIAGNOSIS — R2681 Unsteadiness on feet: Secondary | ICD-10-CM | POA: Diagnosis not present

## 2022-10-10 DIAGNOSIS — R2681 Unsteadiness on feet: Secondary | ICD-10-CM | POA: Diagnosis not present

## 2022-10-10 DIAGNOSIS — R279 Unspecified lack of coordination: Secondary | ICD-10-CM | POA: Diagnosis not present

## 2022-10-10 DIAGNOSIS — M6281 Muscle weakness (generalized): Secondary | ICD-10-CM | POA: Diagnosis not present

## 2022-10-10 DIAGNOSIS — R2689 Other abnormalities of gait and mobility: Secondary | ICD-10-CM | POA: Diagnosis not present

## 2022-10-10 DIAGNOSIS — M6259 Muscle wasting and atrophy, not elsewhere classified, multiple sites: Secondary | ICD-10-CM | POA: Diagnosis not present

## 2022-10-11 DIAGNOSIS — R2681 Unsteadiness on feet: Secondary | ICD-10-CM | POA: Diagnosis not present

## 2022-10-11 DIAGNOSIS — R278 Other lack of coordination: Secondary | ICD-10-CM | POA: Diagnosis not present

## 2022-10-11 DIAGNOSIS — M62512 Muscle wasting and atrophy, not elsewhere classified, left shoulder: Secondary | ICD-10-CM | POA: Diagnosis not present

## 2022-10-11 DIAGNOSIS — M62511 Muscle wasting and atrophy, not elsewhere classified, right shoulder: Secondary | ICD-10-CM | POA: Diagnosis not present

## 2022-10-11 DIAGNOSIS — M25511 Pain in right shoulder: Secondary | ICD-10-CM | POA: Diagnosis not present

## 2022-10-11 DIAGNOSIS — M25561 Pain in right knee: Secondary | ICD-10-CM | POA: Diagnosis not present

## 2022-10-13 DIAGNOSIS — R2681 Unsteadiness on feet: Secondary | ICD-10-CM | POA: Diagnosis not present

## 2022-10-13 DIAGNOSIS — R2689 Other abnormalities of gait and mobility: Secondary | ICD-10-CM | POA: Diagnosis not present

## 2022-10-13 DIAGNOSIS — M6281 Muscle weakness (generalized): Secondary | ICD-10-CM | POA: Diagnosis not present

## 2022-10-13 DIAGNOSIS — M6259 Muscle wasting and atrophy, not elsewhere classified, multiple sites: Secondary | ICD-10-CM | POA: Diagnosis not present

## 2022-10-13 DIAGNOSIS — R279 Unspecified lack of coordination: Secondary | ICD-10-CM | POA: Diagnosis not present

## 2022-10-14 DIAGNOSIS — M62511 Muscle wasting and atrophy, not elsewhere classified, right shoulder: Secondary | ICD-10-CM | POA: Diagnosis not present

## 2022-10-14 DIAGNOSIS — M25511 Pain in right shoulder: Secondary | ICD-10-CM | POA: Diagnosis not present

## 2022-10-14 DIAGNOSIS — M62512 Muscle wasting and atrophy, not elsewhere classified, left shoulder: Secondary | ICD-10-CM | POA: Diagnosis not present

## 2022-10-14 DIAGNOSIS — M25561 Pain in right knee: Secondary | ICD-10-CM | POA: Diagnosis not present

## 2022-10-14 DIAGNOSIS — R2681 Unsteadiness on feet: Secondary | ICD-10-CM | POA: Diagnosis not present

## 2022-10-14 DIAGNOSIS — R278 Other lack of coordination: Secondary | ICD-10-CM | POA: Diagnosis not present

## 2022-10-17 DIAGNOSIS — M6281 Muscle weakness (generalized): Secondary | ICD-10-CM | POA: Diagnosis not present

## 2022-10-17 DIAGNOSIS — R2689 Other abnormalities of gait and mobility: Secondary | ICD-10-CM | POA: Diagnosis not present

## 2022-10-17 DIAGNOSIS — R2681 Unsteadiness on feet: Secondary | ICD-10-CM | POA: Diagnosis not present

## 2022-10-17 DIAGNOSIS — M6259 Muscle wasting and atrophy, not elsewhere classified, multiple sites: Secondary | ICD-10-CM | POA: Diagnosis not present

## 2022-10-17 DIAGNOSIS — R279 Unspecified lack of coordination: Secondary | ICD-10-CM | POA: Diagnosis not present

## 2022-10-18 DIAGNOSIS — R278 Other lack of coordination: Secondary | ICD-10-CM | POA: Diagnosis not present

## 2022-10-18 DIAGNOSIS — R2681 Unsteadiness on feet: Secondary | ICD-10-CM | POA: Diagnosis not present

## 2022-10-18 DIAGNOSIS — M25561 Pain in right knee: Secondary | ICD-10-CM | POA: Diagnosis not present

## 2022-10-18 DIAGNOSIS — M25511 Pain in right shoulder: Secondary | ICD-10-CM | POA: Diagnosis not present

## 2022-10-18 DIAGNOSIS — M62512 Muscle wasting and atrophy, not elsewhere classified, left shoulder: Secondary | ICD-10-CM | POA: Diagnosis not present

## 2022-10-18 DIAGNOSIS — M62511 Muscle wasting and atrophy, not elsewhere classified, right shoulder: Secondary | ICD-10-CM | POA: Diagnosis not present

## 2023-09-01 ENCOUNTER — Emergency Department (HOSPITAL_COMMUNITY)

## 2023-09-01 ENCOUNTER — Emergency Department (HOSPITAL_COMMUNITY)
Admit: 2023-09-01 | Discharge: 2023-09-01 | Disposition: A | Discharge status: Home / Self Care | Attending: Emergency Medicine | Admitting: Emergency Medicine

## 2023-09-01 ENCOUNTER — Encounter (HOSPITAL_COMMUNITY): Payer: Self-pay | Admitting: Internal Medicine

## 2023-09-01 ENCOUNTER — Other Ambulatory Visit: Payer: Self-pay

## 2023-09-01 ENCOUNTER — Inpatient Hospital Stay (HOSPITAL_COMMUNITY)
Admission: EM | Admit: 2023-09-01 | Discharge: 2023-10-03 | DRG: 602 | Disposition: A | Discharge status: Hospice | Source: Skilled Nursing Facility | Attending: Family Medicine | Admitting: Family Medicine

## 2023-09-01 DIAGNOSIS — Z66 Do not resuscitate: Secondary | ICD-10-CM | POA: Diagnosis present

## 2023-09-01 DIAGNOSIS — Y92129 Unspecified place in nursing home as the place of occurrence of the external cause: Secondary | ICD-10-CM | POA: Diagnosis not present

## 2023-09-01 DIAGNOSIS — I5030 Unspecified diastolic (congestive) heart failure: Secondary | ICD-10-CM | POA: Diagnosis present

## 2023-09-01 DIAGNOSIS — Z1152 Encounter for screening for COVID-19: Secondary | ICD-10-CM | POA: Diagnosis not present

## 2023-09-01 DIAGNOSIS — E669 Obesity, unspecified: Secondary | ICD-10-CM | POA: Diagnosis present

## 2023-09-01 DIAGNOSIS — Z833 Family history of diabetes mellitus: Secondary | ICD-10-CM

## 2023-09-01 DIAGNOSIS — L97519 Non-pressure chronic ulcer of other part of right foot with unspecified severity: Secondary | ICD-10-CM | POA: Diagnosis present

## 2023-09-01 DIAGNOSIS — G4733 Obstructive sleep apnea (adult) (pediatric): Secondary | ICD-10-CM | POA: Diagnosis not present

## 2023-09-01 DIAGNOSIS — I4819 Other persistent atrial fibrillation: Secondary | ICD-10-CM | POA: Diagnosis present

## 2023-09-01 DIAGNOSIS — W050XXA Fall from non-moving wheelchair, initial encounter: Secondary | ICD-10-CM | POA: Diagnosis present

## 2023-09-01 DIAGNOSIS — Z8249 Family history of ischemic heart disease and other diseases of the circulatory system: Secondary | ICD-10-CM

## 2023-09-01 DIAGNOSIS — R110 Nausea: Secondary | ICD-10-CM | POA: Diagnosis present

## 2023-09-01 DIAGNOSIS — F32A Depression, unspecified: Secondary | ICD-10-CM | POA: Diagnosis present

## 2023-09-01 DIAGNOSIS — D509 Iron deficiency anemia, unspecified: Secondary | ICD-10-CM | POA: Diagnosis present

## 2023-09-01 DIAGNOSIS — Z85820 Personal history of malignant melanoma of skin: Secondary | ICD-10-CM

## 2023-09-01 DIAGNOSIS — N186 End stage renal disease: Secondary | ICD-10-CM | POA: Diagnosis not present

## 2023-09-01 DIAGNOSIS — I5032 Chronic diastolic (congestive) heart failure: Secondary | ICD-10-CM | POA: Diagnosis not present

## 2023-09-01 DIAGNOSIS — G9340 Encephalopathy, unspecified: Secondary | ICD-10-CM | POA: Diagnosis not present

## 2023-09-01 DIAGNOSIS — J90 Pleural effusion, not elsewhere classified: Secondary | ICD-10-CM | POA: Diagnosis not present

## 2023-09-01 DIAGNOSIS — R197 Diarrhea, unspecified: Secondary | ICD-10-CM | POA: Diagnosis present

## 2023-09-01 DIAGNOSIS — Z7901 Long term (current) use of anticoagulants: Secondary | ICD-10-CM

## 2023-09-01 DIAGNOSIS — E11621 Type 2 diabetes mellitus with foot ulcer: Secondary | ICD-10-CM | POA: Diagnosis present

## 2023-09-01 DIAGNOSIS — Z515 Encounter for palliative care: Secondary | ICD-10-CM | POA: Diagnosis not present

## 2023-09-01 DIAGNOSIS — I83893 Varicose veins of bilateral lower extremities with other complications: Secondary | ICD-10-CM | POA: Diagnosis present

## 2023-09-01 DIAGNOSIS — I878 Other specified disorders of veins: Secondary | ICD-10-CM | POA: Diagnosis present

## 2023-09-01 DIAGNOSIS — I4891 Unspecified atrial fibrillation: Secondary | ICD-10-CM | POA: Diagnosis not present

## 2023-09-01 DIAGNOSIS — L97819 Non-pressure chronic ulcer of other part of right lower leg with unspecified severity: Secondary | ICD-10-CM | POA: Diagnosis present

## 2023-09-01 DIAGNOSIS — E785 Hyperlipidemia, unspecified: Secondary | ICD-10-CM | POA: Diagnosis present

## 2023-09-01 DIAGNOSIS — E871 Hypo-osmolality and hyponatremia: Secondary | ICD-10-CM | POA: Diagnosis present

## 2023-09-01 DIAGNOSIS — F0393 Unspecified dementia, unspecified severity, with mood disturbance: Secondary | ICD-10-CM | POA: Diagnosis present

## 2023-09-01 DIAGNOSIS — D62 Acute posthemorrhagic anemia: Secondary | ICD-10-CM | POA: Diagnosis not present

## 2023-09-01 DIAGNOSIS — Z79899 Other long term (current) drug therapy: Secondary | ICD-10-CM

## 2023-09-01 DIAGNOSIS — E1122 Type 2 diabetes mellitus with diabetic chronic kidney disease: Secondary | ICD-10-CM | POA: Diagnosis present

## 2023-09-01 DIAGNOSIS — S81812A Laceration without foreign body, left lower leg, initial encounter: Secondary | ICD-10-CM | POA: Diagnosis present

## 2023-09-01 DIAGNOSIS — E869 Volume depletion, unspecified: Secondary | ICD-10-CM | POA: Diagnosis not present

## 2023-09-01 DIAGNOSIS — I953 Hypotension of hemodialysis: Secondary | ICD-10-CM | POA: Diagnosis not present

## 2023-09-01 DIAGNOSIS — Z9981 Dependence on supplemental oxygen: Secondary | ICD-10-CM

## 2023-09-01 DIAGNOSIS — Z7401 Bed confinement status: Secondary | ICD-10-CM

## 2023-09-01 DIAGNOSIS — Z7951 Long term (current) use of inhaled steroids: Secondary | ICD-10-CM

## 2023-09-01 DIAGNOSIS — Z823 Family history of stroke: Secondary | ICD-10-CM

## 2023-09-01 DIAGNOSIS — I132 Hypertensive heart and chronic kidney disease with heart failure and with stage 5 chronic kidney disease, or end stage renal disease: Secondary | ICD-10-CM | POA: Diagnosis present

## 2023-09-01 DIAGNOSIS — Z993 Dependence on wheelchair: Secondary | ICD-10-CM

## 2023-09-01 DIAGNOSIS — R591 Generalized enlarged lymph nodes: Secondary | ICD-10-CM | POA: Diagnosis present

## 2023-09-01 DIAGNOSIS — E1151 Type 2 diabetes mellitus with diabetic peripheral angiopathy without gangrene: Secondary | ICD-10-CM | POA: Diagnosis present

## 2023-09-01 DIAGNOSIS — I872 Venous insufficiency (chronic) (peripheral): Secondary | ICD-10-CM | POA: Diagnosis present

## 2023-09-01 DIAGNOSIS — Z96651 Presence of right artificial knee joint: Secondary | ICD-10-CM | POA: Diagnosis present

## 2023-09-01 DIAGNOSIS — L89620 Pressure ulcer of left heel, unstageable: Secondary | ICD-10-CM | POA: Diagnosis present

## 2023-09-01 DIAGNOSIS — L97929 Non-pressure chronic ulcer of unspecified part of left lower leg with unspecified severity: Secondary | ICD-10-CM | POA: Diagnosis present

## 2023-09-01 DIAGNOSIS — L97829 Non-pressure chronic ulcer of other part of left lower leg with unspecified severity: Secondary | ICD-10-CM | POA: Diagnosis present

## 2023-09-01 DIAGNOSIS — R532 Functional quadriplegia: Secondary | ICD-10-CM | POA: Diagnosis present

## 2023-09-01 DIAGNOSIS — R9431 Abnormal electrocardiogram [ECG] [EKG]: Secondary | ICD-10-CM | POA: Diagnosis not present

## 2023-09-01 DIAGNOSIS — I5033 Acute on chronic diastolic (congestive) heart failure: Secondary | ICD-10-CM | POA: Diagnosis present

## 2023-09-01 DIAGNOSIS — R310 Gross hematuria: Secondary | ICD-10-CM | POA: Diagnosis not present

## 2023-09-01 DIAGNOSIS — N179 Acute kidney failure, unspecified: Secondary | ICD-10-CM | POA: Diagnosis not present

## 2023-09-01 DIAGNOSIS — L819 Disorder of pigmentation, unspecified: Secondary | ICD-10-CM | POA: Diagnosis present

## 2023-09-01 DIAGNOSIS — Z8673 Personal history of transient ischemic attack (TIA), and cerebral infarction without residual deficits: Secondary | ICD-10-CM

## 2023-09-01 DIAGNOSIS — I959 Hypotension, unspecified: Secondary | ICD-10-CM | POA: Diagnosis not present

## 2023-09-01 DIAGNOSIS — I2510 Atherosclerotic heart disease of native coronary artery without angina pectoris: Secondary | ICD-10-CM | POA: Diagnosis present

## 2023-09-01 DIAGNOSIS — Z952 Presence of prosthetic heart valve: Secondary | ICD-10-CM | POA: Diagnosis not present

## 2023-09-01 DIAGNOSIS — Z886 Allergy status to analgesic agent status: Secondary | ICD-10-CM

## 2023-09-01 DIAGNOSIS — F0394 Unspecified dementia, unspecified severity, with anxiety: Secondary | ICD-10-CM | POA: Diagnosis present

## 2023-09-01 DIAGNOSIS — L03116 Cellulitis of left lower limb: Secondary | ICD-10-CM | POA: Diagnosis not present

## 2023-09-01 DIAGNOSIS — J449 Chronic obstructive pulmonary disease, unspecified: Secondary | ICD-10-CM | POA: Diagnosis present

## 2023-09-01 DIAGNOSIS — L03115 Cellulitis of right lower limb: Secondary | ICD-10-CM | POA: Diagnosis present

## 2023-09-01 DIAGNOSIS — I10 Essential (primary) hypertension: Secondary | ICD-10-CM | POA: Diagnosis not present

## 2023-09-01 DIAGNOSIS — Z7189 Other specified counseling: Secondary | ICD-10-CM | POA: Diagnosis not present

## 2023-09-01 DIAGNOSIS — M75101 Unspecified rotator cuff tear or rupture of right shoulder, not specified as traumatic: Secondary | ICD-10-CM | POA: Diagnosis present

## 2023-09-01 DIAGNOSIS — I493 Ventricular premature depolarization: Secondary | ICD-10-CM | POA: Diagnosis not present

## 2023-09-01 DIAGNOSIS — R609 Edema, unspecified: Secondary | ICD-10-CM | POA: Diagnosis not present

## 2023-09-01 DIAGNOSIS — L859 Epidermal thickening, unspecified: Secondary | ICD-10-CM | POA: Diagnosis present

## 2023-09-01 DIAGNOSIS — E782 Mixed hyperlipidemia: Secondary | ICD-10-CM | POA: Diagnosis not present

## 2023-09-01 DIAGNOSIS — N183 Chronic kidney disease, stage 3 unspecified: Secondary | ICD-10-CM | POA: Diagnosis present

## 2023-09-01 DIAGNOSIS — F17210 Nicotine dependence, cigarettes, uncomplicated: Secondary | ICD-10-CM | POA: Diagnosis present

## 2023-09-01 DIAGNOSIS — J9621 Acute and chronic respiratory failure with hypoxia: Secondary | ICD-10-CM | POA: Diagnosis not present

## 2023-09-01 DIAGNOSIS — M2142 Flat foot [pes planus] (acquired), left foot: Secondary | ICD-10-CM | POA: Diagnosis present

## 2023-09-01 DIAGNOSIS — Z888 Allergy status to other drugs, medicaments and biological substances status: Secondary | ICD-10-CM

## 2023-09-01 DIAGNOSIS — J9611 Chronic respiratory failure with hypoxia: Secondary | ICD-10-CM | POA: Diagnosis present

## 2023-09-01 DIAGNOSIS — I7090 Unspecified atherosclerosis: Secondary | ICD-10-CM | POA: Diagnosis not present

## 2023-09-01 DIAGNOSIS — Z820 Family history of epilepsy and other diseases of the nervous system: Secondary | ICD-10-CM

## 2023-09-01 DIAGNOSIS — L089 Local infection of the skin and subcutaneous tissue, unspecified: Secondary | ICD-10-CM | POA: Diagnosis not present

## 2023-09-01 DIAGNOSIS — J9811 Atelectasis: Secondary | ICD-10-CM | POA: Diagnosis present

## 2023-09-01 DIAGNOSIS — N390 Urinary tract infection, site not specified: Secondary | ICD-10-CM | POA: Diagnosis not present

## 2023-09-01 DIAGNOSIS — Z6830 Body mass index (BMI) 30.0-30.9, adult: Secondary | ICD-10-CM

## 2023-09-01 DIAGNOSIS — R410 Disorientation, unspecified: Secondary | ICD-10-CM | POA: Diagnosis not present

## 2023-09-01 DIAGNOSIS — M1712 Unilateral primary osteoarthritis, left knee: Secondary | ICD-10-CM | POA: Diagnosis present

## 2023-09-01 DIAGNOSIS — Z885 Allergy status to narcotic agent status: Secondary | ICD-10-CM

## 2023-09-01 DIAGNOSIS — E876 Hypokalemia: Secondary | ICD-10-CM | POA: Diagnosis present

## 2023-09-01 DIAGNOSIS — R000 Tachycardia, unspecified: Secondary | ICD-10-CM | POA: Diagnosis present

## 2023-09-01 DIAGNOSIS — K5900 Constipation, unspecified: Secondary | ICD-10-CM | POA: Diagnosis not present

## 2023-09-01 DIAGNOSIS — D539 Nutritional anemia, unspecified: Secondary | ICD-10-CM | POA: Diagnosis present

## 2023-09-01 LAB — CBC WITH DIFFERENTIAL/PLATELET
Abs Immature Granulocytes: 0.14 10*3/uL — ABNORMAL HIGH (ref 0.00–0.07)
Basophils Absolute: 0.1 10*3/uL (ref 0.0–0.1)
Basophils Relative: 0 %
Eosinophils Absolute: 0.5 10*3/uL (ref 0.0–0.5)
Eosinophils Relative: 4 %
HCT: 27.9 % — ABNORMAL LOW (ref 39.0–52.0)
Hemoglobin: 7.8 g/dL — ABNORMAL LOW (ref 13.0–17.0)
Immature Granulocytes: 1 %
Lymphocytes Relative: 7 %
Lymphs Abs: 1.1 10*3/uL (ref 0.7–4.0)
MCH: 21.6 pg — ABNORMAL LOW (ref 26.0–34.0)
MCHC: 28 g/dL — ABNORMAL LOW (ref 30.0–36.0)
MCV: 77.3 fL — ABNORMAL LOW (ref 80.0–100.0)
Monocytes Absolute: 1.1 10*3/uL — ABNORMAL HIGH (ref 0.1–1.0)
Monocytes Relative: 8 %
Neutro Abs: 11.4 10*3/uL — ABNORMAL HIGH (ref 1.7–7.7)
Neutrophils Relative %: 80 %
Platelets: 265 10*3/uL (ref 150–400)
RBC: 3.61 MIL/uL — ABNORMAL LOW (ref 4.22–5.81)
RDW: 20.4 % — ABNORMAL HIGH (ref 11.5–15.5)
WBC: 14.2 10*3/uL — ABNORMAL HIGH (ref 4.0–10.5)
nRBC: 0 % (ref 0.0–0.2)

## 2023-09-01 LAB — RESP PANEL BY RT-PCR (RSV, FLU A&B, COVID)  RVPGX2
Influenza A by PCR: NEGATIVE
Influenza B by PCR: NEGATIVE
Resp Syncytial Virus by PCR: NEGATIVE
SARS Coronavirus 2 by RT PCR: NEGATIVE

## 2023-09-01 LAB — CG4 I-STAT (LACTIC ACID)
Lactic Acid, Venous: 1.4 mmol/L (ref 0.5–1.9)
Lactic Acid, Venous: 1.2 mmol/L (ref 0.5–1.9)

## 2023-09-01 LAB — COMPREHENSIVE METABOLIC PANEL
ALT: 20 U/L (ref 0–44)
AST: 29 U/L (ref 15–41)
Albumin: 2.3 g/dL — ABNORMAL LOW (ref 3.5–5.0)
Alkaline Phosphatase: 145 U/L — ABNORMAL HIGH (ref 38–126)
Anion gap: 11 (ref 5–15)
BUN: 42 mg/dL — ABNORMAL HIGH (ref 8–23)
CO2: 24 mmol/L (ref 22–32)
Calcium: 8 mg/dL — ABNORMAL LOW (ref 8.9–10.3)
Chloride: 101 mmol/L (ref 98–111)
Creatinine, Ser: 2.37 mg/dL — ABNORMAL HIGH (ref 0.61–1.24)
GFR, Estimated: 28 mL/min — ABNORMAL LOW (ref >60)
Glucose, Bld: 90 mg/dL (ref 70–99)
Potassium: 3.2 mmol/L — ABNORMAL LOW (ref 3.5–5.1)
Sodium: 136 mmol/L (ref 135–145)
Total Bilirubin: 1.3 mg/dL — ABNORMAL HIGH (ref 0.0–1.2)
Total Protein: 5.8 g/dL — ABNORMAL LOW (ref 6.5–8.1)

## 2023-09-01 MED ORDER — VANCOMYCIN HCL 1750 MG/350ML IV SOLN
1750.0000 mg | INTRAVENOUS | Status: DC
  Administered 2023-09-02: 1750 mg via INTRAVENOUS
  Filled 2023-09-01 (×2): qty 350

## 2023-09-01 MED ORDER — ACETAMINOPHEN 325 MG PO TABS
650.0000 mg | ORAL_TABLET | Freq: Four times a day (QID) | ORAL | Status: DC | PRN
  Administered 2023-09-01 – 2023-09-08 (×11): 650 mg via ORAL
  Filled 2023-09-01 (×11): qty 2

## 2023-09-01 MED ORDER — SODIUM CHLORIDE 0.9 % IV BOLUS
500.0000 mL | Freq: Once | INTRAVENOUS | Status: AC
  Administered 2023-09-01: 500 mL via INTRAVENOUS

## 2023-09-01 MED ORDER — ACETAMINOPHEN 650 MG RE SUPP: 650.0000 mg | Freq: Four times a day (QID) | RECTAL | Status: DC | PRN

## 2023-09-01 MED ORDER — LORAZEPAM 1 MG PO TABS
1.0000 mg | ORAL_TABLET | Freq: Two times a day (BID) | ORAL | Status: DC
  Administered 2023-09-01 – 2023-10-02 (×61): 1 mg via ORAL
  Filled 2023-09-01 (×51): qty 1
  Filled 2023-09-01: qty 2
  Filled 2023-09-01 (×9): qty 1

## 2023-09-01 MED ORDER — POTASSIUM CHLORIDE CRYS ER 20 MEQ PO TBCR
40.0000 meq | EXTENDED_RELEASE_TABLET | Freq: Once | ORAL | Status: AC
  Administered 2023-09-01: 40 meq via ORAL
  Filled 2023-09-01: qty 2

## 2023-09-01 MED ORDER — APIXABAN 5 MG PO TABS
5.0000 mg | ORAL_TABLET | Freq: Two times a day (BID) | ORAL | Status: DC
  Administered 2023-09-01 – 2023-09-08 (×14): 5 mg via ORAL
  Filled 2023-09-01 (×9): qty 1
  Filled 2023-09-01: qty 2
  Filled 2023-09-01 (×4): qty 1

## 2023-09-01 MED ORDER — SODIUM CHLORIDE 0.9 % IV SOLN
2.0000 g | Freq: Once | INTRAVENOUS | Status: AC
  Administered 2023-09-01: 2 g via INTRAVENOUS
  Filled 2023-09-01: qty 12.5

## 2023-09-01 MED ORDER — TRAMADOL HCL 50 MG PO TABS
50.0000 mg | ORAL_TABLET | Freq: Two times a day (BID) | ORAL | Status: DC | PRN
  Administered 2023-09-02 – 2023-10-02 (×11): 50 mg via ORAL
  Filled 2023-09-01 (×11): qty 1

## 2023-09-01 MED ORDER — VENLAFAXINE HCL ER 75 MG PO CP24
75.0000 mg | ORAL_CAPSULE | Freq: Every day | ORAL | Status: DC
  Administered 2023-09-02 – 2023-10-02 (×31): 75 mg via ORAL
  Filled 2023-09-01: qty 2
  Filled 2023-09-01 (×5): qty 1
  Filled 2023-09-01: qty 2
  Filled 2023-09-01: qty 1
  Filled 2023-09-01: qty 2
  Filled 2023-09-01: qty 1
  Filled 2023-09-01 (×5): qty 2
  Filled 2023-09-01: qty 1
  Filled 2023-09-01 (×5): qty 2
  Filled 2023-09-01 (×2): qty 1
  Filled 2023-09-01: qty 2
  Filled 2023-09-01 (×2): qty 1
  Filled 2023-09-01 (×3): qty 2
  Filled 2023-09-01: qty 1
  Filled 2023-09-01: qty 2

## 2023-09-01 MED ORDER — VANCOMYCIN HCL IN DEXTROSE 1-5 GM/200ML-% IV SOLN: 1000.0000 mg | Freq: Once | INTRAVENOUS | Status: DC

## 2023-09-01 MED ORDER — POLYETHYLENE GLYCOL 3350 17 G PO PACK: 17.0000 g | PACK | Freq: Every day | ORAL | Status: DC | PRN

## 2023-09-01 MED ORDER — ATORVASTATIN CALCIUM 80 MG PO TABS
80.0000 mg | ORAL_TABLET | ORAL | Status: DC
Start: 2023-09-03 — End: 2023-10-02
  Administered 2023-09-03 – 2023-10-01 (×15): 80 mg via ORAL
  Filled 2023-09-01: qty 1
  Filled 2023-09-01 (×3): qty 4
  Filled 2023-09-01 (×4): qty 1
  Filled 2023-09-01: qty 4
  Filled 2023-09-01: qty 1
  Filled 2023-09-01: qty 4
  Filled 2023-09-01: qty 1
  Filled 2023-09-01: qty 4
  Filled 2023-09-01: qty 1
  Filled 2023-09-01 (×4): qty 4
  Filled 2023-09-01: qty 1
  Filled 2023-09-01: qty 4
  Filled 2023-09-01: qty 1

## 2023-09-01 MED ORDER — MOMETASONE FURO-FORMOTEROL FUM 200-5 MCG/ACT IN AERO
2.0000 | INHALATION_SPRAY | Freq: Two times a day (BID) | RESPIRATORY_TRACT | Status: DC
  Administered 2023-09-02 – 2023-10-02 (×60): 2 via RESPIRATORY_TRACT
  Filled 2023-09-01 (×3): qty 8.8

## 2023-09-01 MED ORDER — SODIUM CHLORIDE 0.9 % IV SOLN: INTRAVENOUS | Status: AC

## 2023-09-01 MED ORDER — VANCOMYCIN HCL IN DEXTROSE 1-5 GM/200ML-% IV SOLN
1000.0000 mg | Freq: Once | INTRAVENOUS | Status: AC
  Administered 2023-09-01: 1000 mg via INTRAVENOUS
  Filled 2023-09-01: qty 200

## 2023-09-01 MED ORDER — PANTOPRAZOLE SODIUM 40 MG PO TBEC
40.0000 mg | DELAYED_RELEASE_TABLET | Freq: Every day | ORAL | Status: DC
  Administered 2023-09-02 – 2023-10-02 (×31): 40 mg via ORAL
  Filled 2023-09-01 (×31): qty 1

## 2023-09-01 MED ORDER — ATENOLOL 25 MG PO TABS
25.0000 mg | ORAL_TABLET | Freq: Every day | ORAL | Status: DC
  Administered 2023-09-02 – 2023-09-04 (×3): 25 mg via ORAL
  Filled 2023-09-01 (×3): qty 1

## 2023-09-01 MED ORDER — MELATONIN 5 MG PO TABS
5.0000 mg | ORAL_TABLET | Freq: Every evening | ORAL | Status: DC | PRN
  Administered 2023-09-03 – 2023-09-27 (×16): 5 mg via ORAL
  Filled 2023-09-01 (×16): qty 1

## 2023-09-01 MED ORDER — ADULT MULTIVITAMIN W/MINERALS CH
1.0000 | ORAL_TABLET | Freq: Every day | ORAL | Status: DC
  Administered 2023-09-02 – 2023-10-02 (×31): 1 via ORAL
  Filled 2023-09-01 (×31): qty 1

## 2023-09-01 NOTE — Progress Notes (Signed)
 RN cleansed and dressed bilateral lower extremity wounds per MD order.

## 2023-09-01 NOTE — Progress Notes (Signed)
 A consult was received from an ED physician for vancomycin and cefepime per pharmacy dosing (for an indication other than meningitis). The patient's profile has been reviewed for ht/wt/allergies/indication/available labs. A one time order has been placed for the above antibiotics.  Further antibiotics/pharmacy consults should be ordered by admitting physician if indicated.                       Bernadene Person, PharmD, BCPS [phone] 09/01/2023, 12:25 PM

## 2023-09-01 NOTE — Progress Notes (Signed)
 Pharmacy Antibiotic Note  Jordan [name] is a 73 y.o. male admitted on 09/01/2023 with worsening skin infection of LLE related to skin tear despite outpatient doxycycline.  Pharmacy has been consulted for vancomycin dosing.  AKI with SCr 2.37 (previous baseline ~1, Oct 2023) Afebrile, WBC elevated at 14.2  Plan: Vancomycin 1g x1 then 1750 mg IV q48h  (SCr 2.37, AUC 495) Measure Vanc levels as needed.  Goal AUC = 400 - 550  Follow up renal function, culture results, and clinical course.      Temp (24hrs), Avg:98.3 F (36.8 C), Min:98.3 F (36.8 C), Max:98.3 F (36.8 C)  Recent Labs  Lab 09/01/23 1225 09/01/23 1228 09/01/23 1415  WBC 14.2*  --   --   CREATININE 2.37*  --   --   LATICACIDVEN  --  1.4 1.2    CrCl cannot be calculated (Unknown ideal weight.).    Allergies  Allergen Reactions   Altace [Ramipril] Other (See Comments)    Dizziness, migraine, weakness- "there is dye in it"   Codeine Itching   Metoprolol Tartrate Other (See Comments)    Dropped the B/P too low- eventually stopped by a MD   Naproxen Itching    Antimicrobials this admission: 3/14 Cefepime x1 3/14 Vancomycin >>   Dose adjustments this admission:   Microbiology results: 3/14 BCx:   Thank you for allowing pharmacy to be a part of this patient's care.  Lynann Beaver PharmD, BCPS [facility] main pharmacy [phone] 09/01/2023 3:46 PM

## 2023-09-01 NOTE — Progress Notes (Signed)
 Bilateral lower extremity venous duplex has been completed. Preliminary results can be found in CV Proc through chart review.  Results were given to Dr. Rodena Medin.   09/01/23 12:37 PM Olen Cordial RVT

## 2023-09-01 NOTE — ED Provider Notes (Signed)
 [place] EMERGENCY DEPARTMENT AT [facility] Provider Note   CSN: [id number] Arrival date & time: 09/01/23  1140     History  Chief Complaint  Patient presents with   Wound Check   Extremity Laceration    HARLEY FITZWATER is a 73 y.o. male.  73 year old male with prior medical history as detailed below presents for evaluation.  Patient arrives with primary complaint of worsening infection of left leg wound.  Patient reports that approximately a week and a half ago his left leg was caught during a transfer.  He had a skin tear at that time.  He has been on doxycycline since.  Patient reports increased pain and erythema around the wound on his left lower leg despite use of oral antibiotics.  Patient referred here by a visiting nurse who was concerned about worsening cellulitis despite oral antibiotics.  The history is provided by the patient and medical records.       Home Medications Prior to Admission medications   Medication Sig Start Date End Date Taking? Authorizing Provider  ACETAMINOPHEN-BUTALBITAL 50-325 MG TABS Take 1 tablet by mouth 2 (two) times daily as needed (migraines).    [provider]  albuterol (VENTOLIN HFA) 108 (90 Base) MCG/ACT inhaler Inhale 1-2 puffs into the lungs every 6 (six) hours as needed for wheezing or shortness of breath.    [provider]  atenolol (TENORMIN) 50 MG tablet Take 50 mg by mouth daily.    [provider]  atorvastatin (LIPITOR) 80 MG tablet Take 80 mg by mouth every other day.    [provider]  bismuth subsalicylate (PEPTO BISMOL) 262 MG/15ML suspension Take 30 mLs by mouth every 6 (six) hours as needed for indigestion.    [provider]  budesonide-formoterol (SYMBICORT) 160-4.5 MCG/ACT inhaler Inhale 2 puffs into the lungs in the morning and at bedtime. Patient taking differently: Inhale 2 puffs into the lungs 2 (two) times daily as needed (for shortness of breath or  wheezing). 03/12/20   Glade Lloyd, MD  Chlorphen-Pseudoephed-APAP (CORICIDIN D PO) Take 1 tablet by mouth daily as needed (for congestion).    [provider]  doxepin (SINEQUAN) 50 MG capsule Take 50 mg by mouth at bedtime as needed (for sleep). 09/21/21   [provider]  EFFEXOR XR 75 MG 24 hr capsule Take 75 mg by mouth daily with breakfast.    [provider]  ELIQUIS 5 MG TABS tablet TAKE 1 TABLET BY MOUTH TWICE A DAY 06/22/22   Weaver, [name] T, PA-C  furosemide (LASIX) 40 MG tablet TAKE 1 TABLET BY MOUTH EVERY OTHER DAY ALTERNATING WITH 1/2 TABLET EVERY OTHER DAY 04/04/22   Jake Bathe, MD  Glycerin (CLEAR EYES ADV DRY & ITCHY RLF) 0.25 % SOLN Apply 1 drop to eye daily as needed (dry eyes).    [provider]  ipratropium-albuterol (DUONEB) 0.5-2.5 (3) MG/3ML SOLN Take 3 mLs by nebulization every 6 (six) hours as needed (shortness of breath/wheezing). 03/12/20   Glade Lloyd, MD  methocarbamol (ROBAXIN) 500 MG tablet Take 1 tablet (500 mg total) by mouth every 6 (six) hours as needed for muscle spasms. 02/08/22   Edmisten, Kristie L, PA  montelukast (SINGULAIR) 10 MG tablet Take 10 mg by mouth as needed (for allergies). 02/07/20   [provider]  Multiple Vitamin (MULTIVITAMIN WITH MINERALS) TABS Take 1 tablet by mouth daily with breakfast.    [provider]  nystatin cream (MYCOSTATIN) APPLY TO AFFECTED  AREA TWICE A DAY Patient taking differently: Apply 1 Application topically 2 (two) times daily as needed for dry skin. 07/20/21   Janetta Hora, PA-C  omeprazole (PRILOSEC OTC) 20 MG tablet Take 20 mg by mouth daily as needed (heartburn).    [provider]      Allergies    Altace [ramipril], Codeine, Metoprolol tartrate, and Naproxen    Review of Systems   Review of Systems  All other systems reviewed and are negative.   Physical Exam Updated Vital Signs BP 106/71 (BP Location: Right Arm)   Pulse (!) 115   Temp  98.3 F (36.8 C) (Oral)   Resp 20   SpO2 93%  Physical Exam Vitals and nursing note reviewed.  Constitutional:      General: He is not in acute distress.    Appearance: Normal appearance. He is well-developed.  HENT:     Head: Normocephalic and atraumatic.  Eyes:     Conjunctiva/sclera: Conjunctivae normal.     Pupils: Pupils are equal, round, and reactive to light.  Cardiovascular:     Rate and Rhythm: Normal rate and regular rhythm.     Heart sounds: Normal heart sounds.  Pulmonary:     Effort: Pulmonary effort is normal. No respiratory distress.     Breath sounds: Normal breath sounds.  Abdominal:     General: There is no distension.     Palpations: Abdomen is soft.     Tenderness: There is no abdominal tenderness.  Musculoskeletal:        General: No deformity. Normal range of motion.     Cervical back: Normal range of motion and neck supple.  Skin:    General: Skin is warm and dry.     Comments: Large wound with surrounding erythema to the left lower extremity.  See images below.  Neurological:     General: No focal deficit present.     Mental Status: He is alert and oriented to person, place, and time.          ED Results / Procedures / Treatments   Labs (all labs ordered are listed, but only abnormal results are displayed) Labs Reviewed  CBC WITH DIFFERENTIAL/PLATELET - Abnormal; Notable for the following components:      Result Value   WBC 14.2 (*)    RBC 3.61 (*)    Hemoglobin 7.8 (*)    HCT 27.9 (*)    MCV 77.3 (*)    MCH 21.6 (*)    MCHC 28.0 (*)    RDW 20.4 (*)    Neutro Abs 11.4 (*)    Monocytes Absolute 1.1 (*)    Abs Immature Granulocytes 0.14 (*)    All other components within normal limits  CULTURE, BLOOD (ROUTINE X 2)  CULTURE, BLOOD (ROUTINE X 2)  RESP PANEL BY RT-PCR (RSV, FLU A&B, COVID)  [id number]  COMPREHENSIVE METABOLIC PANEL  URINALYSIS, W/ REFLEX TO CULTURE (INFECTION SUSPECTED)  I-STAT CG4 LACTIC ACID, ED     EKG None  Radiology VAS [place] LOWER EXTREMITY VENOUS (DVT) (7a-7p) Result Date: 09/01/2023  Lower Venous DVT Study Patient Name:  JASMIN TRUMBULL  Date of Exam:   09/01/2023 Medical Rec #: [id number]          Accession #:    [id number] Date of Birth: [birth year]          Patient Gender: M Patient Age:   89 years Exam Location:  [facility] Procedure:  VAS [place] LOWER EXTREMITY VENOUS (DVT) Referring Phys: Odette Watanabe --------------------------------------------------------------------------------  Indications: Edema.  Limitations: Open wound and poor ultrasound/tissue interface. Performing Technologist: Chanda Busing RVT  Examination Guidelines: A complete evaluation includes B-mode imaging, spectral Doppler, color Doppler, and power Doppler as needed of all accessible portions of each vessel. Bilateral testing is considered an integral part of a complete examination. Limited examinations for reoccurring indications may be performed as noted. The reflux portion of the exam is performed with the patient in reverse Trendelenburg.  +---------+---------------+---------+-----------+----------+-------------------+ RIGHT    CompressibilityPhasicitySpontaneityPropertiesThrombus Aging      +---------+---------------+---------+-----------+----------+-------------------+ CFV      Full           Yes      Yes                                      +---------+---------------+---------+-----------+----------+-------------------+ SFJ      Full                                                             +---------+---------------+---------+-----------+----------+-------------------+ FV Prox  Full                                                             +---------+---------------+---------+-----------+----------+-------------------+ FV Mid   Full                                                              +---------+---------------+---------+-----------+----------+-------------------+ FV Distal               Yes      Yes                                      +---------+---------------+---------+-----------+----------+-------------------+ PFV      Full                                                             +---------+---------------+---------+-----------+----------+-------------------+ POP      Full           Yes      Yes                                      +---------+---------------+---------+-----------+----------+-------------------+ PTV  Not well visualized +---------+---------------+---------+-----------+----------+-------------------+ PERO                                                  Not well visualized +---------+---------------+---------+-----------+----------+-------------------+   +---------+---------------+---------+-----------+----------+-------------------+ LEFT     CompressibilityPhasicitySpontaneityPropertiesThrombus Aging      +---------+---------------+---------+-----------+----------+-------------------+ CFV      Full           Yes      Yes                                      +---------+---------------+---------+-----------+----------+-------------------+ SFJ      Full                                                             +---------+---------------+---------+-----------+----------+-------------------+ FV Prox  Full                                                             +---------+---------------+---------+-----------+----------+-------------------+ FV Mid   Full                                                             +---------+---------------+---------+-----------+----------+-------------------+ FV Distal               Yes      Yes                                      +---------+---------------+---------+-----------+----------+-------------------+  PFV      Full                                                             +---------+---------------+---------+-----------+----------+-------------------+ POP      Full           Yes      Yes                                      +---------+---------------+---------+-----------+----------+-------------------+ PTV                                                   Not well visualized +---------+---------------+---------+-----------+----------+-------------------+ PERO  Not well visualized +---------+---------------+---------+-----------+----------+-------------------+    Summary: RIGHT: - There is no evidence of deep vein thrombosis in the lower extremity. However, portions of this examination were limited- see technologist comments above.  - No cystic structure found in the popliteal fossa. - Ultrasound characteristics of enlarged lymph nodes are noted in the groin.  LEFT: - There is no evidence of deep vein thrombosis in the lower extremity. However, portions of this examination were limited- see technologist comments above.  - No cystic structure found in the popliteal fossa. - Ultrasound characteristics of enlarged lymph nodes noted in the groin.  *See table(s) above for measurements and observations.    Preliminary     Procedures Procedures    Medications Ordered in ED Medications  vancomycin (VANCOCIN) IVPB 1000 mg/200 mL premix (has no administration in time range)  ceFEPIme (MAXIPIME) 2 g in sodium chloride 0.9 % 100 mL IVPB (has no administration in time range)    ED Course/ Medical Decision Making/ A&P                                 Medical Decision Making Amount and/or Complexity of Data Reviewed Labs: ordered. Radiology: ordered.  Risk Prescription drug management. Decision regarding hospitalization.    Medical Screen Complete  This patient presented to the ED with complaint of concern for  cellulitis.  This complaint involves an extensive number of treatment options. The initial differential diagnosis includes, but is not limited to, cellulitis, abrasion, metabolic abnormality, etc.  This presentation is: Acute, Chronic, Self-Limited, Previously Undiagnosed, Uncertain Prognosis, Complicated, Systemic Symptoms, and Threat to Life/Bodily Function  Patient presents with concern for worsening cellulitis of the left lower extremity.  Patient reports abrasion that occurred approximately 2 weeks ago.  He is reporting use of doxycycline in the outpatient setting without improvement.  Apparently, patient was noted to have increased erythema and pain around the abrasion to the left lower extremity.  Patient was sent for evaluation.  Examination is concerning for worsening cellulitis.  Broad-spectrum antibiotics administered here in the ED.  Patient would benefit from admission for further workup or treatment.  Hospitalist service is aware of case and will evaluate for admission.  Co morbidities that complicated the patient's evaluation  See HPI   Additional history obtained: External records from outside sources obtained and reviewed including prior ED visits and prior Inpatient records.    Lab Tests:  I ordered and personally interpreted labs.  The pertinent results include: CBC, CMP, lactic acid, COVID, flu,   Imaging Studies ordered:  I ordered imaging studies including chest x-ray, plain films of left tib-fib and left foot  I agree with the radiologist interpretation.  Problem List / ED Course:  Left lower extremity cellulitis   Disposition:  After consideration of the diagnostic results and the patients response to treatment, I feel that the patent would benefit from admission.          Final Clinical Impression(s) / ED Diagnoses Final diagnoses:  Cellulitis of left lower extremity    Rx / DC Orders ED Discharge Orders     None          Wynetta Fines, MD 09/01/23 1610

## 2023-09-01 NOTE — H&P (Signed)
 History and Physical:    Jordan [name]   MVH:[id number] DOB: 08-22-1950 DOA: 09/01/2023  Referring MD/provider: ED Dr. Rodena Medin PCP: Kennith Center, NP   Patient coming from: Home  Chief Complaint: Worsening leg pain after injury last week  History of Present Illness:   Jordan [name] is an 73 y.o. male with HTN, HFpEF, A-fib, CAD, COPD with chronic hypoxic respiratory failure on 2 L O2 via [place] was brought in from his SNF for worsening of left leg wound.  Patient has known chronic venous stasis disease.  Patient tells me that he was doing well until he got a new wheelchair last week and the nurses aide tried to put him in bed by "lifting me over the hand rails of the bed with me still in my wheelchair".  Patient states that one of the nurses aide lost her balance and he scraped his leg and his skin came off.  They were supposed to wash it and keep it wrapped but they did not do that.  He was started on oral antibiotics however over the last couple of days he has noted increasing redness and pain above where the skin came off.  ED Course:  The patient was noted to have increased erythema above an area of denuded skin.  WBC was 14, creatinine was 2.4, blood pressure was 104/60, heart rate was 115.  He was started on vancomycin and cefepime and brought in for admission.  ROS:   ROS   Review of Systems: General: Denies fever, chills, malaise,  Respiratory: Denies cough, SOB at rest different from baseline Cardiovascular: Denies chest pain or palpitations GI: Denies nausea, vomiting, diarrhea or constipation GU: Denies dysuria, frequency or hematuria CNS: Denies HA, dizziness, confusion, new weakness or clumsiness. 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 Excell Seltzer and Dr. Laneta Simmers   Severe aortic stenosis    Severe aortic insufficiency, moderate to severe aortic stenosis   Sleep apnea    no longer uses cpap   Venous insufficiency    managed by ortho (Dr. Lajoyce Corners)    Past Surgical History:   Past Surgical History:  Procedure Laterality Date   ANKLE SURGERY Right    Dr. Carola Frost, has pins   CARDIOVERSION N/A 07/16/2021   Procedure: CARDIOVERSION;  Surgeon: Thomasene Ripple, DO;  Location: [facility] ENDOSCOPY;  Service: Cardiovascular;  Laterality: N/A;   COLONOSCOPY W/ POLYPECTOMY     KNEE ARTHROSCOPY Right    Dr. Thurston Hole   MULTIPLE EXTRACTIONS WITH ALVEOLOPLASTY Bilateral 05/22/2020   Procedure: MULTIPLE EXTRACTION WITH ALVEOLOPLASTY;  Surgeon: Ocie Doyne, DMD;  Location: [facility] OR;  Service: Oral Surgery;  Laterality: Bilateral;   right  foot surgery,rt great toe callus removal     RIGHT/LEFT HEART CATH AND CORONARY ANGIOGRAPHY N/A 03/06/2020   Procedure: RIGHT/LEFT HEART CATH AND CORONARY ANGIOGRAPHY;  Surgeon: Tonny Bollman, MD;  Location: [facility] INVASIVE CV LAB;  Service: Cardiovascular;  Laterality: N/A;   TEE WITHOUT CARDIOVERSION N/A 03/09/2020   Procedure: TRANSESOPHAGEAL ECHOCARDIOGRAM (TEE);  Surgeon: Lars Masson, MD;  Location: [facility] ENDOSCOPY;  Service: Cardiovascular;  Laterality: N/A;   TEE WITHOUT CARDIOVERSION N/A 06/30/2020   Procedure: TRANSESOPHAGEAL ECHOCARDIOGRAM (TEE);  Surgeon: Tonny Bollman, MD;  Location: [facility] OR;  Service: Open Heart Surgery;  Laterality: N/A;   TONSILLECTOMY AND ADENOIDECTOMY      age 60   TOTAL KNEE ARTHROPLASTY Right 02/07/2022   Procedure: TOTAL KNEE ARTHROPLASTY;  Surgeon: Ollen Gross, MD;  Location: [facility] ORS;  Service: Orthopedics;  Laterality: Right;   TRANSCATHETER AORTIC VALVE REPLACEMENT, TRANSFEMORAL N/A 06/30/2020   Procedure: TRANSCATHETER AORTIC VALVE REPLACEMENT, TRANSFEMORAL;  Surgeon: Tonny Bollman, MD;  Location: [facility] OR;  Service: Open Heart Surgery;  Laterality: N/A;   UPPER GI ENDOSCOPY      Social History:   Social History   Socioeconomic History   Marital status: Single    Spouse name: Not on file   Number of children: 0   Years of education: Not on file   Highest education level: Not on file  Occupational History   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     Social Drivers of Corporate investment banker Strain: Not on file  Food Insecurity: Not on file  Transportation Needs: Not on file  Physical Activity: Not on file  Stress: Not on file  Social Connections: Not on file  Intimate Partner Violence: Not on file    Allergies   Altace [ramipril], Codeine, Metoprolol tartrate, and Naproxen  Family history:   Family History  Problem Relation Age of Onset   Hypertension Mother    Alzheimer's disease Mother    Stroke Father        x 3   Heart attack Father    Congestive Heart Failure Father    Diabetes Other        mat great aunts   Colon cancer Neg Hx    Rectal cancer Neg Hx    Esophageal cancer Neg Hx     Current  Medications:   Prior to Admission medications   Medication Sig Start Date End Date Taking? Authorizing Provider  ACETAMINOPHEN-BUTALBITAL 50-325 MG TABS Take 1 tablet by mouth 2 (two) times daily as needed (migraines).    [provider]  albuterol (VENTOLIN HFA) 108 (90 Base) MCG/ACT inhaler Inhale 1-2 puffs into the lungs every 6 (six) hours as needed for wheezing or shortness of breath.    [provider]  atenolol (TENORMIN) 50 MG tablet Take 50 mg by mouth daily.    [provider]  atorvastatin (LIPITOR) 80 MG tablet Take 80 mg  by mouth every other day.    [provider]  bismuth subsalicylate (PEPTO BISMOL) 262 MG/15ML suspension Take 30 mLs by mouth every 6 (six) hours as needed for indigestion.    [provider]  budesonide-formoterol (SYMBICORT) 160-4.5 MCG/ACT inhaler Inhale 2 puffs into the lungs in the morning and at bedtime. Patient taking differently: Inhale 2 puffs into the lungs 2 (two) times daily as needed (for shortness of breath or wheezing). 03/12/20   Glade Lloyd, MD  Chlorphen-Pseudoephed-APAP (CORICIDIN D PO) Take 1 tablet by mouth daily as needed (for congestion).    [provider]  doxepin (SINEQUAN) 50 MG capsule Take 50 mg by mouth at bedtime as needed (for sleep). 09/21/21   [provider]  EFFEXOR XR 75 MG 24 hr capsule Take 75 mg by mouth daily with breakfast.    [provider]  ELIQUIS 5 MG TABS tablet TAKE 1 TABLET BY MOUTH TWICE A DAY 06/22/22   Weaver, [name] T, PA-C  furosemide (LASIX) 40 MG tablet TAKE 1 TABLET BY MOUTH EVERY OTHER DAY ALTERNATING WITH 1/2 TABLET EVERY OTHER DAY 04/04/22   Jake Bathe, MD  Glycerin (CLEAR EYES ADV DRY & ITCHY RLF) 0.25 % SOLN Apply 1 drop to eye daily as needed (dry eyes).    [provider]  ipratropium-albuterol (DUONEB) 0.5-2.5 (3) MG/3ML SOLN Take 3 mLs by nebulization every 6 (six) hours as needed (shortness of breath/wheezing). 03/12/20    Glade Lloyd, MD  methocarbamol (ROBAXIN) 500 MG tablet Take 1 tablet (500 mg total) by mouth every 6 (six) hours as needed for muscle spasms. 02/08/22   Edmisten, Kristie L, PA  montelukast (SINGULAIR) 10 MG tablet Take 10 mg by mouth as needed (for allergies). 02/07/20   [provider]  Multiple Vitamin (MULTIVITAMIN WITH MINERALS) TABS Take 1 tablet by mouth daily with breakfast.    [provider]  nystatin cream (MYCOSTATIN) APPLY TO AFFECTED AREA TWICE A DAY Patient taking differently: Apply 1 Application topically 2 (two) times daily as needed for dry skin. 07/20/21   Janetta Hora, PA-C  omeprazole (PRILOSEC OTC) 20 MG tablet Take 20 mg by mouth daily as needed (heartburn).    [provider]    Physical Exam:   Vitals:   09/01/23 1220 09/01/23 1245 09/01/23 1315  BP: 106/71 98/65 93/68   Pulse: (!) 115 (!) 106 (!) 108  Resp: 20 13 16   Temp: 98.3 F (36.8 C)    TempSrc: Oral    SpO2: 93% 94% 100%     Physical Exam: Blood pressure 93/68, pulse (!) 108, temperature 98.3 F (36.8 C), temperature source Oral, resp. rate 16, SpO2 100%. Gen: Chronically ill-appearing man lying in bed feeding himself in NAD Eyes: sclera anicteric, conjuctiva mildly injected bilaterally CVS: S1-S2, regulary, no gallops Respiratory:  decreased air entry likely secondary to decreased inspiratory effort GI: NABS, soft, NT  LE: Bilateral lower extremities are grossly abnormal, please see picture from H&P and ED note from Dr. Rodena Medin.  He has bilateral indurated red areas in his lower shins with areas of hyperkeratosis consistent with chronic venous stasis disease.  Most notably he has a large area of denuded skin on left lateral leg below his knee almost down to his ankle with erythema extending proximally.  He does have some discharge unclear if it is purulence or not. Neuro: A/O x 3, Moving all extremities equally with normal strength, CN 3-12 intact, grossly nonfocal.   Psych: patient is logical and  coherent, judgement and insight appear normal, mood and affect appropriate to situation.    Data Review:    Labs: Basic Metabolic Panel: Recent Labs  Lab 09/01/23 1225  NA 136  K 3.2*  CL 101  CO2 24  GLUCOSE 90  BUN 42*  CREATININE 2.37*  CALCIUM 8.0*   Liver Function Tests: Recent Labs  Lab 09/01/23 1225  AST 29  ALT 20  ALKPHOS 145*  BILITOT 1.3*  PROT 5.8*  ALBUMIN 2.3*   No results for input(s): "LIPASE", "AMYLASE" in the last 168 hours. No results for input(s): "AMMONIA" in the last 168 hours. CBC: Recent Labs  Lab 09/01/23 1225  WBC 14.2*  NEUTROABS 11.4*  HGB 7.8*  HCT 27.9*  MCV 77.3*  PLT 265   Cardiac Enzymes: No results for input(s): "CKTOTAL", "CKMB", "CKMBINDEX", "TROPONINI" in the last 168 hours.  BNP (last 3 results) No results for input(s): "PROBNP" in the last 8760 hours. CBG: No results for input(s): "GLUCAP" in the last 168 hours.  Urinalysis    Component Value Date/Time   COLORURINE YELLOW 04/01/2022 1940   APPEARANCEUR CLEAR 04/01/2022 1940   LABSPEC 1.006 04/01/2022 1940   PHURINE 6.0 04/01/2022 1940   GLUCOSEU NEGATIVE 04/01/2022 1940   HGBUR NEGATIVE 04/01/2022 1940   BILIRUBINUR NEGATIVE 04/01/2022 1940   KETONESUR NEGATIVE 04/01/2022 1940   PROTEINUR NEGATIVE 04/01/2022 1940   NITRITE NEGATIVE 04/01/2022 1940   LEUKOCYTESUR NEGATIVE 04/01/2022 1940      Radiographic Studies: VAS [place] LOWER EXTREMITY VENOUS (DVT) (7a-7p) Result Date: 09/01/2023  Lower Venous DVT Study Patient Name:  Jordan [name]  Date of Exam:   09/01/2023 Medical Rec #: [id number]          Accession #:    [id number] Date of Birth: 1950-06-29          Patient Gender: M Patient Age:   62 years Exam Location:  [facility] Procedure:      VAS [place] LOWER EXTREMITY VENOUS (DVT) Referring Phys: Theron Arista MESSICK --------------------------------------------------------------------------------  Indications: Edema.   Limitations: Open wound and poor ultrasound/tissue interface. Performing Technologist: Chanda Busing RVT  Examination Guidelines: A complete evaluation includes B-mode imaging, spectral Doppler, color Doppler, and power Doppler as needed of all accessible portions of each vessel. Bilateral testing is considered an integral part of a complete examination. Limited examinations for reoccurring indications may be performed as noted. The reflux portion of the exam is performed with the patient in reverse Trendelenburg.  +---------+---------------+---------+-----------+----------+-------------------+ RIGHT    CompressibilityPhasicitySpontaneityPropertiesThrombus Aging      +---------+---------------+---------+-----------+----------+-------------------+ CFV      Full           Yes      Yes                                      +---------+---------------+---------+-----------+----------+-------------------+ SFJ      Full                                                             +---------+---------------+---------+-----------+----------+-------------------+ FV Prox  Full                                                             +---------+---------------+---------+-----------+----------+-------------------+  FV Mid   Full                                                             +---------+---------------+---------+-----------+----------+-------------------+ FV Distal               Yes      Yes                                      +---------+---------------+---------+-----------+----------+-------------------+ PFV      Full                                                             +---------+---------------+---------+-----------+----------+-------------------+ POP      Full           Yes      Yes                                      +---------+---------------+---------+-----------+----------+-------------------+ PTV                                                    Not well visualized +---------+---------------+---------+-----------+----------+-------------------+ PERO                                                  Not well visualized +---------+---------------+---------+-----------+----------+-------------------+   +---------+---------------+---------+-----------+----------+-------------------+ LEFT     CompressibilityPhasicitySpontaneityPropertiesThrombus Aging      +---------+---------------+---------+-----------+----------+-------------------+ CFV      Full           Yes      Yes                                      +---------+---------------+---------+-----------+----------+-------------------+ SFJ      Full                                                             +---------+---------------+---------+-----------+----------+-------------------+ FV Prox  Full                                                             +---------+---------------+---------+-----------+----------+-------------------+ FV Mid   Full                                                             +---------+---------------+---------+-----------+----------+-------------------+  FV Distal               Yes      Yes                                      +---------+---------------+---------+-----------+----------+-------------------+ PFV      Full                                                             +---------+---------------+---------+-----------+----------+-------------------+ POP      Full           Yes      Yes                                      +---------+---------------+---------+-----------+----------+-------------------+ PTV                                                   Not well visualized +---------+---------------+---------+-----------+----------+-------------------+ PERO                                                  Not well visualized  +---------+---------------+---------+-----------+----------+-------------------+    Summary: RIGHT: - There is no evidence of deep vein thrombosis in the lower extremity. However, portions of this examination were limited- see technologist comments above.  - No cystic structure found in the popliteal fossa. - Ultrasound characteristics of enlarged lymph nodes are noted in the groin.  LEFT: - There is no evidence of deep vein thrombosis in the lower extremity. However, portions of this examination were limited- see technologist comments above.  - No cystic structure found in the popliteal fossa. - Ultrasound characteristics of enlarged lymph nodes noted in the groin.  *See table(s) above for measurements and observations.    Preliminary     EKG: Independently reviewed.  Atrial fibrillation at 94, normal axis, diffusely flattened T waves throughout, no acute ST-T wave changes   Assessment/Plan:   Active Problems:   * No active hospital problems. *   Soft tissue infection Chronic venous stasis disease PVD Start vancomycin with assistance from pharmacy for dosing Wound consult placed In the meanwhile will wash legs and cover denuded skin with Vaseline gauze to keep moist He has areas of hyperkeratosis also that hopefully can be treated once infection is resolved  AKI Last known creatinine was from 16 months ago and was 0.9 Elevated creatinine today at 2.7 very possibly combination of infection and intravascular volume depletion Will hydrate with NS at 100 treat infection and follow creatinine  Hypokalemia Will replete and recheck  Atrial fibrillation S/PE Eliquis HFpEF S/p TAVR Patient is on atenolol 50 however BP is low We will start atenolol 25 to try to maintain control of heart rate Continue Eliquis for secondary stroke prevention  HTN BP is low but should improve with hydration with NS Will start atenolol at 25 mg  rather than his usual 54 rate control for A-fib Hold home doses  of Lasix  Microcytic anemia Last known hemoglobin was 8.6 in October 2023 Will follow hemoglobin for stability Check anemia labs  COPD Chronic hypoxic respiratory failure on 2 L home O2 Continue home inhaled bronchodilators No evidence for acute flare  Anxiety and depression Restart Effexor and lorazepam per home doses      Other information:   DVT prophylaxis: Eliquis ordered. Code Status: Full Family Communication: None today Disposition Plan: Back to SNF Consults called: None Admission status: Inpatient  Lamyiah Crawshaw Tublu Peretz Thieme [facility]  If 7PM-7AM, please contact night-coverage [email or web address]

## 2023-09-01 NOTE — ED Triage Notes (Signed)
 Pt BIBA from [facility] for left leg wound and left second toe laceration. Was getting moved to the bed when someone lost their grip and he fell into the bed. Skin tear to left shin that has gotten worse, now about 6" diameter with deeper tear in the middle. Toe has black around the laceration. Leg swelling has gotten worse the last few weeks as well. Hx dm, afib. Denies pain.  HR 70-120s RR 20

## 2023-09-01 NOTE — ED Notes (Signed)
 U/s at bedside

## 2023-09-02 DIAGNOSIS — L089 Local infection of the skin and subcutaneous tissue, unspecified: Secondary | ICD-10-CM | POA: Diagnosis not present

## 2023-09-02 LAB — CBC
HCT: 26.6 % — ABNORMAL LOW (ref 39.0–52.0)
Hemoglobin: 7.3 g/dL — ABNORMAL LOW (ref 13.0–17.0)
MCH: 21.5 pg — ABNORMAL LOW (ref 26.0–34.0)
MCHC: 27.4 g/dL — ABNORMAL LOW (ref 30.0–36.0)
MCV: 78.5 fL — ABNORMAL LOW (ref 80.0–100.0)
Platelets: 296 10*3/uL (ref 150–400)
RBC: 3.39 MIL/uL — ABNORMAL LOW (ref 4.22–5.81)
RDW: 20.5 % — ABNORMAL HIGH (ref 11.5–15.5)
WBC: 12.1 10*3/uL — ABNORMAL HIGH (ref 4.0–10.5)
nRBC: 0 % (ref 0.0–0.2)

## 2023-09-02 LAB — COMPREHENSIVE METABOLIC PANEL
ALT: 18 U/L (ref 0–44)
AST: 27 U/L (ref 15–41)
Albumin: 2 g/dL — ABNORMAL LOW (ref 3.5–5.0)
Alkaline Phosphatase: 128 U/L — ABNORMAL HIGH (ref 38–126)
Anion gap: 8 (ref 5–15)
BUN: 42 mg/dL — ABNORMAL HIGH (ref 8–23)
CO2: 24 mmol/L (ref 22–32)
Calcium: 7.7 mg/dL — ABNORMAL LOW (ref 8.9–10.3)
Chloride: 103 mmol/L (ref 98–111)
Creatinine, Ser: 2.53 mg/dL — ABNORMAL HIGH (ref 0.61–1.24)
GFR, Estimated: 26 mL/min — ABNORMAL LOW (ref >60)
Glucose, Bld: 79 mg/dL (ref 70–99)
Potassium: 3.6 mmol/L (ref 3.5–5.1)
Sodium: 135 mmol/L (ref 135–145)
Total Bilirubin: 0.9 mg/dL (ref 0.0–1.2)
Total Protein: 5.4 g/dL — ABNORMAL LOW (ref 6.5–8.1)

## 2023-09-02 LAB — MRSA NEXT GEN BY PCR, NASAL: MRSA by PCR Next Gen: NOT DETECTED

## 2023-09-02 MED ORDER — SODIUM CHLORIDE 0.9 % IV BOLUS
1000.0000 mL | Freq: Once | INTRAVENOUS | Status: AC
  Administered 2023-09-02: 1000 mL via INTRAVENOUS

## 2023-09-02 NOTE — Progress Notes (Signed)
 PROGRESS NOTE    Jordan [name]  ZOX:[id number]  DOB: June 01, 1951  DOA: 09/01/2023 PCP: Kennith Center, NP Outpatient Specialists:   Hospital course:  73 y.o. male with HTN, HFpEF, A-fib, CAD, COPD with chronic hypoxic respiratory failure on 2 L O2 via [place], chronic venous stasis disease and PVD was admitted yesterday for worsening cellulitis failing outpatient treatment after injury at nursing home the previous week.   Subjective:  Patient think he is doing the same although is appreciative of the wrapping and care he got of his wound yesterday.   Objective: Vitals:   09/02/23 0843 09/02/23 1021 09/02/23 1030 09/02/23 1308  BP:  107/65  (!) 90/59  Pulse: (!) 112 (!) 121 (!) 104 93  Resp:  18  16  Temp:  98.4 F (36.9 C)  97.9 F (36.6 C)  TempSrc:  Oral  Oral  SpO2: 99% 95%  94%  Weight:      Height:        Intake/Output Summary (Last 24 hours) at 09/02/2023 1541 Last data filed at 09/02/2023 1500 Gross per 24 hour  Intake 3040.76 ml  Output 1350 ml  Net 1690.76 ml   Filed Weights   09/01/23 1702  Weight: 80.7 kg     Exam:  General: Patient lying in bed at 20 degrees in NAD Eyes: sclera anicteric, conjuctiva mild injection bilaterally CVS: S1-S2, regular  Respiratory:  decreased air entry bilaterally secondary to decreased inspiratory effort, rales at bases  GI: NABS, soft, NT  LE: Patient with bilateral circumferential hyperkeratotic and hyperpigmented changes consistent with venous stasis disease.  Area of denuded skin from yesterday with cellulitis is now wrapped. Neuro: A/O x 3,  grossly nonfocal.  Psych: patient is logical and coherent, judgement and insight appear normal, mood and affect appropriate to situation.  Data Reviewed:  Basic Metabolic Panel: Recent Labs  Lab 09/01/23 1225 09/02/23 0525  NA 136 135  K 3.2* 3.6  CL 101 103  CO2 24 24  GLUCOSE 90 79  BUN 42* 42*  CREATININE 2.37* 2.53*  CALCIUM 8.0* 7.7*    CBC: Recent Labs   Lab 09/01/23 1225 09/02/23 0525  WBC 14.2* 12.1*  NEUTROABS 11.4*  --   HGB 7.8* 7.3*  HCT 27.9* 26.6*  MCV 77.3* 78.5*  PLT 265 296     Scheduled Meds:  apixaban  5 mg Oral BID   atenolol  25 mg Oral Daily   [START ON 09/03/2023] atorvastatin  80 mg Oral QODAY   LORazepam  1 mg Oral BID   mometasone-formoterol  2 puff Inhalation BID   multivitamin with minerals  1 tablet Oral Q breakfast   pantoprazole  40 mg Oral Daily   venlafaxine XR  75 mg Oral Q breakfast   Continuous Infusions:  vancomycin 1,750 mg (09/02/23 1326)     Assessment & Plan:    Soft tissue infection Chronic venous stasis disease PVD Continue vancomycin day #2, appreciate pharmacy assistance Leukocytosis is improving Wound consult is placed and pending   AKI Creatinine worsened overnight despite fluid resuscitation NS@ 100 Patient with excellent urine output yesterday Will give patient 500 cc bolus Will send urine for formal UA Last known creatinine was from 16 months ago and was 0.9 Follow creatinine and urine output closely   Hypokalemia Normalized with repletion   HTN BP remains low despite hydration with NS, hopefully will improve with bolus as noted above However he is tolerating atenolol 25 mg (down from usual 50mg   dose) being given for atrial fibrillation rate control Hold home doses of Lasix  Atrial fibrillation S/PE Eliquis HFpEF S/p TAVR Rate is not under optimal control on atenolol 25 Will increase fluid resuscitation and increase atenolol as tolerated Continue Eliquis for secondary stroke prevention   Microcytic anemia Last known hemoglobin was 8.6 in October 2023 Hemoglobin is relatively stable at 7.3 today,  Anemia labs pending   COPD Chronic hypoxic respiratory failure on 2 L home O2 Continue home inhaled bronchodilators No evidence for acute flare   Anxiety and depression Restart Effexor and lorazepam per home doses    DVT prophylaxis: Eliquis Code Status:  Full Family Communication: None today     Studies: VAS [place] LOWER EXTREMITY VENOUS (DVT) (7a-7p) Result Date: 09/02/2023  Lower Venous DVT Study Patient Name:  Jordan [name]  Date of Exam:   09/01/2023 Medical Rec #: [id number]          Accession #:    [id number] Date of Birth: 04/03/1951          Patient Gender: M Patient Age:   73 years Exam Location:  [facility] Procedure:      VAS [place] LOWER EXTREMITY VENOUS (DVT) Referring Phys: Theron Arista MESSICK --------------------------------------------------------------------------------  Indications: Edema.  Limitations: Open wound and poor ultrasound/tissue interface. Performing Technologist: Chanda Busing RVT  Examination Guidelines: A complete evaluation includes B-mode imaging, spectral Doppler, color Doppler, and power Doppler as needed of all accessible portions of each vessel. Bilateral testing is considered an integral part of a complete examination. Limited examinations for reoccurring indications may be performed as noted. The reflux portion of the exam is performed with the patient in reverse Trendelenburg.  +---------+---------------+---------+-----------+----------+-------------------+ RIGHT    CompressibilityPhasicitySpontaneityPropertiesThrombus Aging      +---------+---------------+---------+-----------+----------+-------------------+ CFV      Full           Yes      Yes                                      +---------+---------------+---------+-----------+----------+-------------------+ SFJ      Full                                                             +---------+---------------+---------+-----------+----------+-------------------+ FV Prox  Full                                                             +---------+---------------+---------+-----------+----------+-------------------+ FV Mid   Full                                                              +---------+---------------+---------+-----------+----------+-------------------+ FV Distal               Yes      Yes                                      +---------+---------------+---------+-----------+----------+-------------------+  PFV      Full                                                             +---------+---------------+---------+-----------+----------+-------------------+ POP      Full           Yes      Yes                                      +---------+---------------+---------+-----------+----------+-------------------+ PTV                                                   Not well visualized +---------+---------------+---------+-----------+----------+-------------------+ PERO                                                  Not well visualized +---------+---------------+---------+-----------+----------+-------------------+   +---------+---------------+---------+-----------+----------+-------------------+ LEFT     CompressibilityPhasicitySpontaneityPropertiesThrombus Aging      +---------+---------------+---------+-----------+----------+-------------------+ CFV      Full           Yes      Yes                                      +---------+---------------+---------+-----------+----------+-------------------+ SFJ      Full                                                             +---------+---------------+---------+-----------+----------+-------------------+ FV Prox  Full                                                             +---------+---------------+---------+-----------+----------+-------------------+ FV Mid   Full                                                             +---------+---------------+---------+-----------+----------+-------------------+ FV Distal               Yes      Yes                                      +---------+---------------+---------+-----------+----------+-------------------+  PFV      Full                                                             +---------+---------------+---------+-----------+----------+-------------------+  POP      Full           Yes      Yes                                      +---------+---------------+---------+-----------+----------+-------------------+ PTV                                                   Not well visualized +---------+---------------+---------+-----------+----------+-------------------+ PERO                                                  Not well visualized +---------+---------------+---------+-----------+----------+-------------------+     Summary: RIGHT: - There is no evidence of deep vein thrombosis in the lower extremity. However, portions of this examination were limited- see technologist comments above.  - No cystic structure found in the popliteal fossa. - Ultrasound characteristics of enlarged lymph nodes are noted in the groin.  LEFT: - There is no evidence of deep vein thrombosis in the lower extremity. However, portions of this examination were limited- see technologist comments above.  - No cystic structure found in the popliteal fossa. - Ultrasound characteristics of enlarged lymph nodes noted in the groin.  *See table(s) above for measurements and observations. Electronically signed by Heath Lark on 09/02/2023 at 2:19:21 PM.    Final    DG Foot Complete Left Result Date: 09/01/2023 CLINICAL DATA:  Weakness.  Redness.  Soft tissue infection. EXAM: LEFT FOOT - COMPLETE 3+ VIEW COMPARISON:  12/29/2020 FINDINGS: Soft tissue swelling of the second toe. Deformity of the distal aspect of the proximal phalanx that could be post traumatic or infectious. The patient also shows bone loss at the tip of the distal phalanx of the great toe, but this is chronic since the study of 2022 and not apparently progressive. Otherwise, there is pes planus and there chronic degenerative changes in the midfoot.  IMPRESSION: 1. Soft tissue swelling of the second toe. Deformity of the distal aspect of the proximal phalanx that could be post traumatic or infectious. 2. Bone loss at the tip of the distal phalanx of the great toe, but this is chronic since the study of 2022 and not apparently progressive. 3. Pes planus and chronic degenerative changes in the midfoot. Electronically Signed   By: Paulina Fusi M.D.   On: 09/01/2023 15:44   DG Chest Port 1 View Result Date: 09/01/2023 CLINICAL DATA:  Weakness.  Lower extremity infection. EXAM: PORTABLE CHEST 1 VIEW COMPARISON:  04/02/2022 FINDINGS: Artifact overlies the chest. Poor inspiration. Atelectasis and or pneumonia in both lung bases, right worse than left. Possible small amount of pleural fluid on the right. Upper lungs are clear. IMPRESSION: Poor inspiration. Atelectasis and or pneumonia in both lung bases, right worse than left. Possible small amount of pleural fluid on the right. Electronically Signed   By: Paulina Fusi M.D.   On: 09/01/2023 15:41   DG Tibia/Fibula Left Result Date: 09/01/2023 CLINICAL DATA:  Weakness.  Fell.  Laceration. EXAM: LEFT TIBIA AND FIBULA - 2 VIEW COMPARISON:  None Available. FINDINGS: Osteoarthritis of the knee joint. Distant ORIF of medial  malleolar tibial fracture. No evidence of regional acute injury or osteomyelitis. Skin and vascular calcification is noted. IMPRESSION: No acute or traumatic finding. Osteoarthritis of the knee joint. Distant ORIF of medial malleolar tibial fracture. Skin and vascular calcification. Electronically Signed   By: Paulina Fusi M.D.   On: 09/01/2023 15:41    Principal Problem:   Soft tissue infection     Pieter Partridge, [facility]  If 7PM-7AM, please contact night-coverage [email or web address]   LOS: 1 day

## 2023-09-03 DIAGNOSIS — L089 Local infection of the skin and subcutaneous tissue, unspecified: Secondary | ICD-10-CM | POA: Diagnosis not present

## 2023-09-03 LAB — BASIC METABOLIC PANEL
Anion gap: 9 (ref 5–15)
BUN: 41 mg/dL — ABNORMAL HIGH (ref 8–23)
CO2: 21 mmol/L — ABNORMAL LOW (ref 22–32)
Calcium: 7.7 mg/dL — ABNORMAL LOW (ref 8.9–10.3)
Chloride: 107 mmol/L (ref 98–111)
Creatinine, Ser: 2.11 mg/dL — ABNORMAL HIGH (ref 0.61–1.24)
GFR, Estimated: 32 mL/min — ABNORMAL LOW (ref >60)
Glucose, Bld: 90 mg/dL (ref 70–99)
Potassium: 3.5 mmol/L (ref 3.5–5.1)
Sodium: 137 mmol/L (ref 135–145)

## 2023-09-03 LAB — CBC
HCT: 26.5 % — ABNORMAL LOW (ref 39.0–52.0)
Hemoglobin: 7.4 g/dL — ABNORMAL LOW (ref 13.0–17.0)
MCH: 21.5 pg — ABNORMAL LOW (ref 26.0–34.0)
MCHC: 27.9 g/dL — ABNORMAL LOW (ref 30.0–36.0)
MCV: 77 fL — ABNORMAL LOW (ref 80.0–100.0)
Platelets: 311 10*3/uL (ref 150–400)
RBC: 3.44 MIL/uL — ABNORMAL LOW (ref 4.22–5.81)
RDW: 20.5 % — ABNORMAL HIGH (ref 11.5–15.5)
WBC: 11.2 10*3/uL — ABNORMAL HIGH (ref 4.0–10.5)
nRBC: 0 % (ref 0.0–0.2)

## 2023-09-03 LAB — URINALYSIS, ROUTINE W REFLEX MICROSCOPIC
Bilirubin Urine: NEGATIVE
Glucose, UA: NEGATIVE mg/dL
Ketones, ur: NEGATIVE mg/dL
Leukocytes,Ua: NEGATIVE
Nitrite: NEGATIVE
Protein, ur: NEGATIVE mg/dL
Specific Gravity, Urine: 1.008 (ref 1.005–1.030)
pH: 5 (ref 5.0–8.0)

## 2023-09-03 LAB — RETICULOCYTES
Immature Retic Fract: 32.6 % — ABNORMAL HIGH (ref 2.3–15.9)
RBC.: 3.38 MIL/uL — ABNORMAL LOW (ref 4.22–5.81)
Retic Count, Absolute: 67.6 10*3/uL (ref 19.0–186.0)
Retic Ct Pct: 2 % (ref 0.4–3.1)

## 2023-09-03 LAB — IRON AND TIBC
Iron: 22 ug/dL — ABNORMAL LOW (ref 45–182)
Saturation Ratios: 11 % — ABNORMAL LOW (ref 17.9–39.5)
TIBC: 203 ug/dL — ABNORMAL LOW (ref 250–450)
UIBC: 181 ug/dL

## 2023-09-03 LAB — FERRITIN: Ferritin: 57 ng/mL (ref 24–336)

## 2023-09-03 LAB — FOLATE: Folate: 20 ng/mL (ref >5.9)

## 2023-09-03 LAB — VITAMIN B12: Vitamin B-12: 1137 pg/mL — ABNORMAL HIGH (ref 180–914)

## 2023-09-03 MED ORDER — ALUM & MAG HYDROXIDE-SIMETH 200-200-20 MG/5ML PO SUSP
30.0000 mL | ORAL | Status: DC | PRN
  Administered 2023-09-03 – 2023-09-17 (×2): 30 mL via ORAL
  Filled 2023-09-03 (×2): qty 30

## 2023-09-03 NOTE — Progress Notes (Signed)
 PROGRESS NOTE    Jordan [name]  UJW:[id number]  DOB: 09/23/1950  DOA: 09/01/2023 PCP: Jordan Center, NP Outpatient Specialists:   Hospital course:  73 y.o. male with HTN, HFpEF, A-fib, CAD, COPD with chronic hypoxic respiratory failure on 2 L O2 via [place], chronic venous stasis disease and PVD was admitted yesterday for worsening cellulitis failing outpatient treatment after injury at nursing home the previous week.   Subjective:  Patient is in good spirits, says he feels better, has less pain in his leg, feels he is getting really good care here because the nurses are changing his dressing daily which does not occur at his SNF   Objective: Vitals:   09/03/23 0518 09/03/23 0900 09/03/23 0901 09/03/23 1220  BP: (!) 103/57 114/75  128/80  Pulse: (!) 104 (!) 105 98 (!) 110  Resp: 19 18  17   Temp: 98.2 F (36.8 C) 97.7 F (36.5 C)  (!) 97.5 F (36.4 C)  TempSrc: Oral Oral  Oral  SpO2: 94% 100%  96%  Weight:      Height:        Intake/Output Summary (Last 24 hours) at 09/03/2023 1548 Last data filed at 09/03/2023 1533 Gross per 24 hour  Intake 1878.74 ml  Output 1450 ml  Net 428.74 ml   Filed Weights   09/01/23 1702  Weight: 80.7 kg     Exam:  General: Patient lying in bed at 20 degrees in NAD Eyes: sclera anicteric, conjuctiva mild injection bilaterally CVS: S1-S2, regular  Respiratory:  decreased air entry bilaterally secondary to decreased inspiratory effort, rales at bases  GI: NABS, soft, NT  LE: Patient with bilateral circumferential hyperkeratotic and hyperpigmented changes consistent with venous stasis disease.  Area of denuded skin from yesterday with cellulitis is now wrapped. Neuro: A/O x 3,  grossly nonfocal.  Psych: patient is logical and coherent, judgement and insight appear normal, mood and affect appropriate to situation.  Data Reviewed:  Basic Metabolic Panel: Recent Labs  Lab 09/01/23 1225 09/02/23 0525 09/03/23 0440  NA 136 135  137  K 3.2* 3.6 3.5  CL 101 103 107  CO2 24 24 21*  GLUCOSE 90 79 90  BUN 42* 42* 41*  CREATININE 2.37* 2.53* 2.11*  CALCIUM 8.0* 7.7* 7.7*    CBC: Recent Labs  Lab 09/01/23 1225 09/02/23 0525 09/03/23 0440  WBC 14.2* 12.1* 11.2*  NEUTROABS 11.4*  --   --   HGB 7.8* 7.3* 7.4*  HCT 27.9* 26.6* 26.5*  MCV 77.3* 78.5* 77.0*  PLT 265 296 311     Scheduled Meds:  apixaban  5 mg Oral BID   atenolol  25 mg Oral Daily   atorvastatin  80 mg Oral QODAY   LORazepam  1 mg Oral BID   mometasone-formoterol  2 puff Inhalation BID   multivitamin with minerals  1 tablet Oral Q breakfast   pantoprazole  40 mg Oral Daily   venlafaxine XR  75 mg Oral Q breakfast   Continuous Infusions:  vancomycin 1,750 mg (09/02/23 1326)     Assessment & Plan:    Soft tissue infection Chronic venous stasis disease PVD Continue vancomycin day #2, appreciate pharmacy assistance Leukocytosis is improving Patient can be discharged with wound dressing recommendations as below to be done at SNF Would consider ABIs if cellulitis does not heal as anticipated Wound consult notes the following: Large skin tear; left lateral calf; pink, clean Full thickness injury; left second toe; crusted  100% black wounds (  linear) right proximal pretibial, circular necrotic wound right pretibial  With following dressing recommendations: Paint RLE wounds with betadine, allow to air dry, cover with foam Cleanse LLE wounds and toe wound with saline, pat dry Cover with single layer of xeroform gauze, top with foam. Change daily   AKI Creatinine improved with small fluid bolus yesterday Last known creatinine was from 16 months ago and was 0.9, baseline otherwise unknown Patient is eating and drinking well no IVF at present but if creatinine does not improve would reconsider  Follow creatinine and urine output closely   Hypokalemia Normalized with repletion   HTN BPnormalized with NS Continue atenolol 25 mg (down  from usual 50mg  dose) being given for atrial fibrillation rate control Hold home doses of Lasix  Atrial fibrillation S/PE Eliquis HFpEF S/p TAVR Rate is not under optimal control on atenolol 25 Will increase fluid resuscitation and increase atenolol as tolerated Continue Eliquis for secondary stroke prevention   Microcytic anemia Last known hemoglobin was 8.6 in October 2023 Hemoglobin 7.3 on admission and is stable Anemia labs suggestive of ACD   COPD Chronic hypoxic respiratory failure on 2 L home O2 Continue home inhaled bronchodilators No evidence for acute flare   Anxiety and depression Restart Effexor and lorazepam per home doses    DVT prophylaxis: Eliquis Code Status: Full Family Communication: None today     Studies: No results found.   Principal Problem:   Soft tissue infection     Pieter Partridge, [facility]  If 7PM-7AM, please contact night-coverage [email or web address]   LOS: 2 days

## 2023-09-03 NOTE — Plan of Care (Signed)
  Problem: Education: Goal: Knowledge of General Education information will improve Description: Including pain rating scale, medication(s)/side effects and non-pharmacologic comfort measures Outcome: Progressing   Problem: Health Behavior/Discharge Planning: Goal: Ability to manage health-related needs will improve Outcome: Progressing   Problem: Clinical Measurements: Goal: Will remain free from infection Outcome: Progressing   Problem: Pain Managment: Goal: General experience of comfort will improve and/or be controlled Outcome: Progressing

## 2023-09-03 NOTE — Consult Note (Signed)
 WOC Nurse Consult Note: Reason for Consult: LE wounds Patient from SNF, trauma to the LLE and toe during transfer. Patient with known venous stasis, however RLE wounds are necrotic. No ABIs on  record. Has not been followed outpatient for LE wounds Wound type: Large skin tear; left lateral calf; pink, clean Full thickness injury; left second toe; crusted  100% black wounds (linear) right proximal pretibial, circular necrotic wound right pretibial  Pressure Injury POA: NA Measurement: see nursing flow sheets Wound bed:see above  Drainage (amount, consistency, odor) see nursing flow sheet Periwound:edema  Dressing procedure/placement/frequency: Paint RLE wounds with betadine, allow to air dry, cover with foam Cleanse LLE wounds and toe wound with saline, pat dry Cover with single layer of xeroform gauze, top with foam. Change daily     Re consult if needed, will not follow at this time. Thanks  Khoa Opdahl [employer], RN,CWOCN, CNS, CWON-AP [phone])

## 2023-09-04 DIAGNOSIS — L089 Local infection of the skin and subcutaneous tissue, unspecified: Secondary | ICD-10-CM | POA: Diagnosis not present

## 2023-09-04 LAB — CBC WITH DIFFERENTIAL/PLATELET
Abs Immature Granulocytes: 0.14 10*3/uL — ABNORMAL HIGH (ref 0.00–0.07)
Basophils Absolute: 0 10*3/uL (ref 0.0–0.1)
Basophils Relative: 0 %
Eosinophils Absolute: 0.4 10*3/uL (ref 0.0–0.5)
Eosinophils Relative: 3 %
HCT: 27.1 % — ABNORMAL LOW (ref 39.0–52.0)
Hemoglobin: 7.4 g/dL — ABNORMAL LOW (ref 13.0–17.0)
Immature Granulocytes: 1 %
Lymphocytes Relative: 10 %
Lymphs Abs: 1.2 10*3/uL (ref 0.7–4.0)
MCH: 21.3 pg — ABNORMAL LOW (ref 26.0–34.0)
MCHC: 27.3 g/dL — ABNORMAL LOW (ref 30.0–36.0)
MCV: 78.1 fL — ABNORMAL LOW (ref 80.0–100.0)
Monocytes Absolute: 0.8 10*3/uL (ref 0.1–1.0)
Monocytes Relative: 7 %
Neutro Abs: 9.1 10*3/uL — ABNORMAL HIGH (ref 1.7–7.7)
Neutrophils Relative %: 79 %
Platelets: 241 10*3/uL (ref 150–400)
RBC: 3.47 MIL/uL — ABNORMAL LOW (ref 4.22–5.81)
RDW: 20.7 % — ABNORMAL HIGH (ref 11.5–15.5)
WBC: 11.6 10*3/uL — ABNORMAL HIGH (ref 4.0–10.5)
nRBC: 0 % (ref 0.0–0.2)

## 2023-09-04 LAB — BASIC METABOLIC PANEL
Anion gap: 7 (ref 5–15)
BUN: 37 mg/dL — ABNORMAL HIGH (ref 8–23)
CO2: 22 mmol/L (ref 22–32)
Calcium: 7.7 mg/dL — ABNORMAL LOW (ref 8.9–10.3)
Chloride: 109 mmol/L (ref 98–111)
Creatinine, Ser: 2.05 mg/dL — ABNORMAL HIGH (ref 0.61–1.24)
GFR, Estimated: 34 mL/min — ABNORMAL LOW (ref >60)
Glucose, Bld: 114 mg/dL — ABNORMAL HIGH (ref 70–99)
Potassium: 3.5 mmol/L (ref 3.5–5.1)
Sodium: 138 mmol/L (ref 135–145)

## 2023-09-04 MED ORDER — OXYCODONE HCL 5 MG PO TABS
5.0000 mg | ORAL_TABLET | Freq: Two times a day (BID) | ORAL | Status: DC | PRN
  Administered 2023-09-04 – 2023-10-01 (×18): 5 mg via ORAL
  Filled 2023-09-04 (×18): qty 1

## 2023-09-04 MED ORDER — CEPHALEXIN 500 MG PO CAPS
500.0000 mg | ORAL_CAPSULE | Freq: Three times a day (TID) | ORAL | Status: AC
  Administered 2023-09-04 – 2023-09-08 (×12): 500 mg via ORAL
  Filled 2023-09-04 (×12): qty 1

## 2023-09-04 MED ORDER — ONDANSETRON HCL 4 MG/2ML IJ SOLN
4.0000 mg | Freq: Three times a day (TID) | INTRAMUSCULAR | Status: DC | PRN
  Administered 2023-09-04 – 2023-09-21 (×4): 4 mg via INTRAVENOUS
  Filled 2023-09-04 (×4): qty 2

## 2023-09-04 NOTE — Progress Notes (Signed)
 PROGRESS NOTE Jordan [name]  ZOX:[id number]  DOB: 1951-04-14  DOA: 09/01/2023 PCP: Kennith Center, NP  Hospital course: 73 y.o. male with HTN, HFpEF, A-fib, CAD, COPD with chronic hypoxic respiratory failure on 2 L O2 via [place], chronic venous stasis disease and PVD was admitted yesterday for worsening cellulitis failing outpatient treatment after injury at nursing home the previous week. Describes an injury to LLE with a transfer in facility.   Subjective: Feels unwell. Complains of pain in his legs. Denies SOB, CP, abdominal pain, nausea.   Objective: Vitals:   09/03/23 1220 09/03/23 1822 09/03/23 2046 09/04/23 0616  BP: 128/80  108/64 110/69  Pulse: (!) 110     Resp: 17  17 17   Temp: (!) 97.5 F (36.4 C)  97.8 F (36.6 C) 97.9 F (36.6 C)  TempSrc: Oral  Oral Oral  SpO2: 96% 95% 96% 95%  Weight:      Height:        Intake/Output Summary (Last 24 hours) at 09/04/2023 0719 Last data filed at 09/04/2023 8119 Gross per 24 hour  Intake 2538.74 ml  Output 800 ml  Net 1738.74 ml   Filed Weights   09/01/23 1702  Weight: 80.7 kg    Exam:  General: Patient resting, NAD Eyes: sclera anicteric, conjuctiva mild injection bilaterally CVS: S1-S2, regular  Respiratory:  normal inspiratory effort, CTAB GI: NABS, soft, NT  LE: Patient with bilateral circumferential hyperkeratotic and hyperpigmented changes consistent with venous stasis disease. Several bandages in place and clean Neuro: A/O x 3,  grossly nonfocal.  Psych: patient is logical and coherent, judgement and insight appear normal, mood and affect appropriate to situation.  Data Reviewed:    Latest Ref Rng & Units 09/04/2023    4:07 AM 09/03/2023    4:40 AM 09/02/2023    5:25 AM  BMP  Glucose 70 - 99 mg/dL 147  90  79   BUN 8 - 23 mg/dL 37  41  42   Creatinine 0.61 - 1.24 mg/dL 8.29  5.62  1.30   Sodium 135 - 145 mmol/L 138  137  135   Potassium 3.5 - 5.1 mmol/L 3.5  3.5  3.6   Chloride 98 - 111 mmol/L 109  107   103   CO2 22 - 32 mmol/L 22  21  24    Calcium 8.9 - 10.3 mg/dL 7.7  7.7  7.7       Latest Ref Rng & Units 09/04/2023    4:07 AM 09/03/2023    4:40 AM 09/02/2023    5:25 AM  CBC  WBC 4.0 - 10.5 K/uL 11.6  11.2  12.1   Hemoglobin 13.0 - 17.0 g/dL 7.4  7.4  7.3   Hematocrit 39.0 - 52.0 % 27.1  26.5  26.6   Platelets 150 - 400 K/uL 241  311  296    Assessment & Plan:   Soft tissue infection Chronic venous stasis disease PVD Continue antibiotics: vancomycin changed to keflex Awaiting ALF dc Continue wound care: Paint RLE wounds with betadine, allow to air dry, cover with foam Cleanse LLE wounds and toe wound with saline, pat dry Cover with single layer of xeroform gauze, top with foam. Change daily   AKI- Cr 2.53>>2.05 Creatinine improved  Follow up tomorrow   Hypokalemia Normalized with repletion   HTN BPnormalized with NS Continue atenolol 25 mg (down from usual 50mg  dose) being given for atrial fibrillation rate control Hold home doses of Lasix  Atrial fibrillation S/PE  Eliquis HFpEF S/p TAVR Rate is not under optimal control on atenolol 25 Will increase fluid resuscitation and increase atenolol as tolerated Continue Eliquis for secondary stroke prevention   Microcytic anemia Last known hemoglobin was 8.6 in October 2023 Hemoglobin 7.3 on admission and is stable Anemia labs suggestive of ACD   COPD Chronic hypoxic respiratory failure on 2 L home O2 Continue home inhaled bronchodilators No evidence for acute flare   Anxiety and depression Restart Effexor and lorazepam per home doses    DVT prophylaxis: Eliquis Code Status: Full Family Communication: None today   Leeroy Bock, [facility]  If 7PM-7AM, please contact night-coverage [email or web address]   LOS: 3 days

## 2023-09-04 NOTE — TOC Initial Note (Signed)
 Transition of Care [facility]) - Initial/Assessment Note    Patient Details  Name: Jordan [name] MRN: [id number] Date of Birth: 12-12-1950  Transition of Care [facility]) CM/SW Contact:    Amada Jupiter, LCSW Phone Number: 09/04/2023, 2:56 PM  Clinical Narrative:                  Met with Jordan [name] today to review dc planning needs.  Jordan [name] confirms that he has been an ALF resident at [facility] since Jan 2024.  Notes he was initially ambulatory with RW with some wheelchair use but, now, is primarily wheelchair level mobility.  Confirms he has assistance with ADLs and toileting.  He is anticipating returning to his ALF apt, however, understands CSW will need to confirm with facility that they can provide necessary wound care to his lower extremities.  He agrees fully with this and notes he had been concerned about this prior to this admission.  Have left VM for the RN liaison/ resident care coordinator to review care needs.  Expected Discharge Plan: Assisted Living Barriers to Discharge: Continued Medical Work up   Patient Goals and CMS Choice Patient states their goals for this hospitalization and ongoing recovery are:: return to ALF          Expected Discharge Plan and Services In-house Referral: Clinical Social Work     Living arrangements for the past 2 months: Assisted Living Facility                                      Prior Living Arrangements/Services Living arrangements for the past 2 months: Assisted Living Facility Lives with:: Facility Resident Patient language and need for interpreter reviewed:: Yes Do you feel safe going back to the place where you live?: Yes      Need for Family Participation in Patient Care: No (Comment) Care giver support system in place?: Yes (comment) Current home services: Homehealth aide, Housekeeping Criminal Activity/Legal Involvement Pertinent to Current Situation/Hospitalization: No - Comment as needed  Activities of Daily Living   ADL  Screening (condition at time of admission) Independently performs ADLs?: No Does the patient have a NEW difficulty with bathing/dressing/toileting/self-feeding that is expected to last >3 days?: No Does the patient have a NEW difficulty with getting in/out of bed, walking, or climbing stairs that is expected to last >3 days?: No Does the patient have a NEW difficulty with communication that is expected to last >3 days?: No Is the patient deaf or have difficulty hearing?: No Does the patient have difficulty seeing, even when wearing glasses/contacts?: No Does the patient have difficulty concentrating, remembering, or making decisions?: No  Permission Sought/Granted Permission sought to share information with : Facility Medical sales representative, Family Supports Permission granted to share information with : Yes, Verbal Permission Granted  Share Information with NAME: aunt, Linna Hoff [phone]  Permission granted to share info w AGENCY: [facility]        Emotional Assessment Appearance:: Appears stated age Attitude/Demeanor/Rapport: Gracious, Engaged Affect (typically observed): Accepting, Pleasant Orientation: : Oriented to Self, Oriented to Place, Oriented to  Time, Oriented to Situation Alcohol / Substance Use: Not Applicable Psych Involvement: No (comment)  Admission diagnosis:  Soft tissue infection [L08.9] Cellulitis of left lower extremity [L03.116] Patient Active Problem List   Diagnosis Date Noted   Soft tissue infection 09/01/2023   SIRS (systemic inflammatory response syndrome) (HCC) 04/01/2022   Delirium 04/01/2022  Normocytic anemia 04/01/2022   Depression with anxiety 04/01/2022   OA (osteoarthritis) of knee 02/07/2022   Osteoarthritis of right knee 02/07/2022   Preoperative cardiovascular examination 11/03/2021   Sinus bradycardia 11/03/2021   Stage 3 chronic kidney disease (HCC) 08/30/2021   Aortic atherosclerosis (HCC) 08/30/2021   Chronic  respiratory failure with hypoxia (HCC) 02/23/2021   Anxiety    Persistent atrial fibrillation (HCC) 01/03/2021   Open wound of left foot 12/30/2020   Macrocytic anemia 12/30/2020   Arthritis 12/30/2020   Chronic heart failure with preserved ejection fraction (HCC) 12/30/2020   Left leg cellulitis 12/29/2020   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   Diabetes mellitus without complication (HCC)    COPD (chronic obstructive pulmonary disease) (HCC)    CAD (coronary artery disease)    Severe aortic stenosis s/p TAVR in Jan 2022    GERD (gastroesophageal reflux disease)    Protein-calorie malnutrition, severe 03/05/2020   Tobacco abuse 03/03/2020   Pressure injury of skin 03/15/2017   Chronic venous insufficiency 04/28/2014   PAD (peripheral artery disease) (HCC) 01/21/2014   Varicose veins of bilateral lower extremities with other complications 01/21/2014   Hx of TIA (transient ischemic attack) and stroke 12/31/2013   HLD (hyperlipidemia) 12/31/2013   Severe obesity (BMI >= 40) (HCC) 12/31/2013   Essential hypertension 09/13/2012   Edema 09/13/2012   PCP:  Kennith Center, NP Pharmacy:   [facility] [phone] Ginette Otto, [place] - [address] [place] [address] Phone: [phone] Fax: [phone]     Social Drivers of Health (SDOH) Social History: SDOH Screenings   Food Insecurity: Patient Declined (09/01/2023)  Housing: Patient Declined (09/01/2023)  Transportation Needs: Patient Declined (09/01/2023)  Utilities: Patient Declined (09/01/2023)  Social Connections: Patient Declined (09/01/2023)  Tobacco Use: High Risk (09/01/2023)   SDOH Interventions:     Readmission Risk Interventions    09/04/2023    2:49 PM 04/04/2022   11:02 AM  Readmission Risk Prevention Plan  Transportation Screening Complete Complete  PCP or Specialist Appt within 5-7 Days Complete   PCP or Specialist Appt within 3-5 Days  Complete  Home  Care Screening Complete   Medication Review (RN CM) Complete   HRI or Home Care Consult  Complete  Social Work Consult for Recovery Care Planning/Counseling  Complete  Palliative Care Screening  Not Applicable  Medication Review Oceanographer)  Complete

## 2023-09-04 NOTE — Plan of Care (Signed)

## 2023-09-04 NOTE — Plan of Care (Signed)

## 2023-09-05 ENCOUNTER — Inpatient Hospital Stay (HOSPITAL_COMMUNITY)

## 2023-09-05 DIAGNOSIS — I7090 Unspecified atherosclerosis: Secondary | ICD-10-CM | POA: Diagnosis not present

## 2023-09-05 DIAGNOSIS — L089 Local infection of the skin and subcutaneous tissue, unspecified: Secondary | ICD-10-CM | POA: Diagnosis not present

## 2023-09-05 LAB — BASIC METABOLIC PANEL
Anion gap: 8 (ref 5–15)
BUN: 43 mg/dL — ABNORMAL HIGH (ref 8–23)
CO2: 22 mmol/L (ref 22–32)
Calcium: 7.9 mg/dL — ABNORMAL LOW (ref 8.9–10.3)
Chloride: 105 mmol/L (ref 98–111)
Creatinine, Ser: 2.5 mg/dL — ABNORMAL HIGH (ref 0.61–1.24)
GFR, Estimated: 26 mL/min — ABNORMAL LOW (ref >60)
Glucose, Bld: 96 mg/dL (ref 70–99)
Potassium: 4.1 mmol/L (ref 3.5–5.1)
Sodium: 135 mmol/L (ref 135–145)

## 2023-09-05 LAB — VAS US ABI WITH/WO TBI: Right ABI: 1.15

## 2023-09-05 MED ORDER — ATENOLOL 25 MG PO TABS
50.0000 mg | ORAL_TABLET | Freq: Every day | ORAL | Status: DC
  Administered 2023-09-05 – 2023-09-06 (×2): 50 mg via ORAL
  Filled 2023-09-05 (×2): qty 2

## 2023-09-05 NOTE — [place] FL2 (Signed)
 [place] MEDICAID FL2 LEVEL OF CARE FORM     IDENTIFICATION  Patient Name: Jordan [name] Birthdate: [birth year] Sex: male Admission Date (Current Location): 09/01/2023  [facility] [place] and [place] Number:  Producer, television/film/video and Address:  [facility],  [address] [place], [place] [address]      Provider Number: [id number]  Attending Physician Name and Address:  Leeroy Bock, MD  Relative Name and Phone Number:  aunt, Bolton Canupp @ [phone]    Current Level of Care: Hospital Recommended Level of Care: Skilled Nursing Facility Prior Approval Number:    Date Approved/Denied:   PASRR Number: [id number]  Discharge Plan: SNF    Current Diagnoses: Patient Active Problem List   Diagnosis Date Noted   Soft tissue infection 09/01/2023   SIRS (systemic inflammatory response syndrome) (HCC) 04/01/2022   Delirium 04/01/2022   Normocytic anemia 04/01/2022   Depression with anxiety 04/01/2022   OA (osteoarthritis) of knee 02/07/2022   Osteoarthritis of right knee 02/07/2022   Preoperative cardiovascular examination 11/03/2021   Sinus bradycardia 11/03/2021   Stage 3 chronic kidney disease (HCC) 08/30/2021   Aortic atherosclerosis (HCC) 08/30/2021   Chronic respiratory failure with hypoxia (HCC) 02/23/2021   Anxiety    Persistent atrial fibrillation (HCC) 01/03/2021   Open wound of left foot 12/30/2020   Macrocytic anemia 12/30/2020   Arthritis 12/30/2020   Chronic heart failure with preserved ejection fraction (HCC) 12/30/2020   Left leg cellulitis 12/29/2020   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   Diabetes mellitus without complication (HCC)    COPD (chronic obstructive pulmonary disease) (HCC)    CAD (coronary artery disease)    Severe aortic stenosis s/p TAVR in Jan 2022    GERD (gastroesophageal reflux disease)    Protein-calorie malnutrition, severe 03/05/2020   Tobacco abuse 03/03/2020   Pressure injury of skin  03/15/2017   Chronic venous insufficiency 04/28/2014   PAD (peripheral artery disease) (HCC) 01/21/2014   Varicose veins of bilateral lower extremities with other complications 01/21/2014   Hx of TIA (transient ischemic attack) and stroke 12/31/2013   HLD (hyperlipidemia) 12/31/2013   Severe obesity (BMI >= 40) (HCC) 12/31/2013   Essential hypertension 09/13/2012   Edema 09/13/2012    Orientation RESPIRATION BLADDER Height & Weight     Self, Time, Situation, Place  Normal Continent, External catheter Weight: 178 lb (80.7 kg) Height:  6\' 1"  (185.4 cm)  BEHAVIORAL SYMPTOMS/MOOD NEUROLOGICAL BOWEL NUTRITION STATUS      Continent    AMBULATORY STATUS COMMUNICATION OF NEEDS Skin   Extensive Assist Verbally Other (Comment) (Large skin tear; left lateral calf; pink, clean  Full thickness injury; left second toe; crusted   100% black wounds (linear) right proximal pretibial, circular necrotic wound right pretibial)                       Personal Care Assistance Level of Assistance  Bathing, Dressing Bathing Assistance: Limited assistance   Dressing Assistance: Limited assistance     Functional Limitations Info  Sight, Hearing, Speech Sight Info: Adequate Hearing Info: Adequate Speech Info: Adequate    SPECIAL CARE FACTORS FREQUENCY  PT (By licensed PT), OT (By licensed OT)     PT Frequency: 5x/wk OT Frequency: 5x/wk            Contractures Contractures Info: Not present    Additional Factors Info  Code Status, Allergies Code Status Info: Full Allergies Info: Altace (Ramipril), Codeine, Metoprolol Tartrate, Naproxen  Current Medications (09/05/2023):  This is the current hospital active medication list Current Facility-Administered Medications  Medication Dose Route Frequency Provider Last Rate Last Admin   acetaminophen (TYLENOL) tablet 650 mg  650 mg Oral Q6H PRN Leandro Reasoner Tublu, MD   650 mg at 09/04/23 1416   Or   acetaminophen (TYLENOL)  suppository 650 mg  650 mg Rectal Q6H PRN Pieter Partridge, MD       alum & mag hydroxide-simeth (MAALOX/MYLANTA) 200-200-20 MG/5ML suspension 30 mL  30 mL Oral Q4H PRN Leandro Reasoner Tublu, MD   30 mL at 09/03/23 2223   apixaban (ELIQUIS) tablet 5 mg  5 mg Oral BID Leandro Reasoner Tublu, MD   5 mg at 09/05/23 0800   atenolol (TENORMIN) tablet 50 mg  50 mg Oral Daily Jamelle Rushing L, MD   50 mg at 09/05/23 0800   atorvastatin (LIPITOR) tablet 80 mg  80 mg Oral Corinna Capra Tublu, MD   80 mg at 09/05/23 0800   cephALEXin (KEFLEX) capsule 500 mg  500 mg Oral Q8H Jamelle Rushing L, MD   500 mg at 09/05/23 1412   LORazepam (ATIVAN) tablet 1 mg  1 mg Oral BID Leandro Reasoner Tublu, MD   1 mg at 09/05/23 0800   melatonin tablet 5 mg  5 mg Oral QHS PRN Leandro Reasoner Tublu, MD   5 mg at 09/03/23 2224   mometasone-formoterol (DULERA) 200-5 MCG/ACT inhaler 2 puff  2 puff Inhalation BID Leandro Reasoner Tublu, MD   2 puff at 09/05/23 0800   multivitamin with minerals tablet 1 tablet  1 tablet Oral Q breakfast Leandro Reasoner Tublu, MD   1 tablet at 09/05/23 0800   ondansetron (ZOFRAN) injection 4 mg  4 mg Intravenous Q8H PRN Leeroy Bock, MD   4 mg at 09/04/23 1308   oxyCODONE (Oxy IR/ROXICODONE) immediate release tablet 5 mg  5 mg Oral BID PRN Leeroy Bock, MD   5 mg at 09/04/23 0950   pantoprazole (PROTONIX) EC tablet 40 mg  40 mg Oral Daily Leandro Reasoner Tublu, MD   40 mg at 09/05/23 0800   polyethylene glycol (MIRALAX / GLYCOLAX) packet 17 g  17 g Oral Daily PRN Pieter Partridge, MD       traMADol Janean Sark) tablet 50 mg  50 mg Oral BID PRN Leandro Reasoner Tublu, MD   50 mg at 09/04/23 6578   venlafaxine XR (EFFEXOR-XR) 24 hr capsule 75 mg  75 mg Oral Q breakfast Leandro Reasoner Tublu, MD   75 mg at 09/05/23 0800     Discharge Medications: Please see discharge summary for a list of discharge medications.  Relevant  Imaging Results:  Relevant Lab Results:   Additional Information Wound care: Cleanse LLE wounds and toe wound with saline, pat dry  Cover with single layer of xeroform gauze, top with foam. Change daily  Damontre Millea, LCSW

## 2023-09-05 NOTE — TOC Progression Note (Addendum)
 Transition of Care [facility]) - Progression Note    Patient Details  Name: Jordan [name] MRN: [id number] Date of Birth: 08/10/1950  Transition of Care [facility]) CM/SW Contact  Jordan [name], [place] Phone Number: 09/05/2023, 1:50 PM  Clinical Narrative:     Have spoken with Jordan [name], nursing director at [facility], to review pt's care needs.  Requested notes sent for Jordan [name] to review and to determine if pt will be cleared for ALF return.  Jordan [name] contact:  [phone] or email @ [email or web address]   ADDENDUM: Alerted by Jordan [name] with Jordan [name] that, after review of notes, they feel pt is currently requiring more skilled support (nursing and therapy  than they can provide.  Recommending pt dc to short term SNF prior to return to ALF.  Discussed with pt and he is agreeable with this plan. He does have questions for ALF about the room/ bed hold and have asked ALF to reach out to patient.   Have alerted MD of above info.  SNF bed search begun.  Expected Discharge Plan: Assisted Living Barriers to Discharge: Continued Medical Work up  Expected Discharge Plan and Services In-house Referral: Clinical Social Work     Living arrangements for the past 2 months: Assisted Living Facility                                       Social Determinants of Health (SDOH) Interventions SDOH Screenings   Food Insecurity: Patient Declined (09/01/2023)  Housing: Patient Declined (09/01/2023)  Transportation Needs: Patient Declined (09/01/2023)  Utilities: Patient Declined (09/01/2023)  Social Connections: Patient Declined (09/01/2023)  Tobacco Use: High Risk (09/01/2023)    Readmission Risk Interventions    09/04/2023    2:49 PM 04/04/2022   11:02 AM  Readmission Risk Prevention Plan  Transportation Screening Complete Complete  PCP or Specialist Appt within 5-7 Days Complete   PCP or Specialist Appt within 3-5 Days  Complete  Home Care Screening Complete    Medication Review (RN CM) Complete   HRI or Home Care Consult  Complete  Social Work Consult for Recovery Care Planning/Counseling  Complete  Palliative Care Screening  Not Applicable  Medication Review Oceanographer)  Complete

## 2023-09-05 NOTE — Progress Notes (Signed)
 ABI's have been completed. Preliminary results can be found in CV Proc through chart review.   09/05/23 10:56 AM Olen Cordial RVT

## 2023-09-05 NOTE — Evaluation (Addendum)
 Physical Therapy Evaluation Patient Details Name: Jordan [name] MRN: [id number] DOB: May 02, 1951 Today's Date: 09/05/2023  History of Present Illness  73 yo male admitted with soft tissue infection/L LE wound, L toe laceration after sustaining a fall at ALF while be transferred back to bed per pt report. Hx of TAVR, bil LE edema, R TKA 07/2021, DM, Afib, anemia, anxiety, CHF, COPD-on O2 PRN per pt, obesity, aortic stenosis, venous insufficiency  Clinical Impression  On eval, pt required Mod A for mobility. He sat EOB for ~ 5 minutes with Min A for sitting balance. Pt reported weakness and fatigue and requested to return to supine. Bil LEs are tender to touch-pt able to move them off/on to bed with some assistance. Dyspnea  with minimal activity-pt not wearing O2. Discussed d/c plan-he reports he plans to return to ALF. He reports they have been helping him mobilize as needed and can continue to do so. Recommend pt return to ALF with HHPT f/u as long as facility can provide current level of care.    Addendum: Per TOC note, ALF cannot provide pt's current level of care. PT recommendation has been updated to SNF. Patient will benefit from continued inpatient follow up therapy, <3 hours/day prior to returning to ALF.       If plan is discharge home, recommend the following: A lot of help with walking and/or transfers;A lot of help with bathing/dressing/bathroom;Assistance with cooking/housework;Assist for transportation;Help with stairs or ramp for entrance   Can travel by private vehicle        Equipment Recommendations None recommended by PT  Recommendations for Other Services       Functional Status Assessment Patient has had a recent decline in their functional status and/or demonstrates limited ability to make significant improvements in function in a reasonable and predictable amount of time     Precautions / Restrictions Precautions Precautions: Fall Precaution/Restrictions Comments:  multiple LE wounds-tender to touch Restrictions Weight Bearing Restrictions Per Provider Order: No      Mobility  Bed Mobility Overal bed mobility: Needs Assistance Bed Mobility: Supine to Sit, Sit to Supine     Supine to sit: HOB elevated, Mod assist, Used rails Sit to supine: HOB elevated, Mod assist, Used rails   General bed mobility comments: Increased time. Assist for bil LEs and for trunk to upright. Cues for provided. Dyspnea with minimal activity-pt not wearing O2. Fatigued quickly. Unable to scoot fully to EOB    Transfers                   General transfer comment: NT-pt to weak, fatigued. Pt requested to return to bed.    Ambulation/Gait                  Stairs            Wheelchair Mobility     Tilt Bed    Modified Rankin (Stroke Patients Only)       Balance Overall balance assessment: Needs assistance Sitting-balance support: Bilateral upper extremity supported Sitting balance-Leahy Scale: Poor   Postural control: Posterior lean                                   Pertinent Vitals/Pain Pain Assessment Pain Assessment: Faces Faces Pain Scale: Hurts whole lot Pain Location: bil LEs Pain Descriptors / Indicators: Tender, Grimacing, Guarding Pain Intervention(s): Limited activity within patient's tolerance, Monitored during session,  Repositioned    Home Living Family/patient expects to be discharged to:: Assisted living                 Home Equipment: Wheelchair - Forensic psychologist (2 wheels);Cane - single point;BSC/3in1      Prior Function Prior Level of Function : Needs assist             Mobility Comments: assistance with transfers to/from [facility] ADLs Comments: assist for bathing, dressing     Extremity/Trunk Assessment        Lower Extremity Assessment Lower Extremity Assessment: LLE deficits/detail;RLE deficits/detail RLE Deficits / Details: pt able to SLR. Knee flexed at rest. very  tender 2* multiple wounds RLE: Unable to fully assess due to pain LLE Deficits / Details: pt able to SLR, knee flexed at rest. very tender 2* multiple wounds LLE: Unable to fully assess due to pain    Cervical / Trunk Assessment Cervical / Trunk Assessment: Kyphotic  Communication   Communication Communication: No apparent difficulties    Cognition Arousal: Alert Behavior During Therapy: WFL for tasks assessed/performed   PT - Cognitive impairments: No apparent impairments                         Following commands: Intact       Cueing Cueing Techniques: Verbal cues     General Comments      Exercises     Assessment/Plan    PT Assessment Patient needs continued PT services  PT Problem List Decreased strength;Decreased range of motion;Decreased activity tolerance;Decreased balance;Decreased mobility;Decreased skin integrity;Pain       PT Treatment Interventions DME instruction;Gait training;Stair training;Functional mobility training;Therapeutic activities;Therapeutic exercise;Patient/family education;Balance training    PT Goals (Current goals can be found in the Care Plan section)  Acute Rehab PT Goals Patient Stated Goal: less pain. return to ALF PT Goal Formulation: With patient Time For Goal Achievement: 09/19/23 Potential to Achieve Goals: Fair    Frequency Min 2X/week     Co-evaluation               AM-PAC PT "6 Clicks" Mobility  Outcome Measure Help needed turning from your back to your side while in a flat bed without using bedrails?: A Lot Help needed moving from lying on your back to sitting on the side of a flat bed without using bedrails?: A Lot Help needed moving to and from a bed to a chair (including a wheelchair)?: Total Help needed standing up from a chair using your arms (e.g., wheelchair or bedside chair)?: Total Help needed to walk in hospital room?: Total Help needed climbing 3-5 steps with a railing? : Total 6 Click  Score: 8    End of Session   Activity Tolerance: Patient limited by fatigue;Patient limited by pain Patient left: in bed;with call bell/phone within reach;with bed alarm set;with family/visitor present   PT Visit Diagnosis: Pain;History of falling (Z91.81);Muscle weakness (generalized) (M62.81);Other abnormalities of gait and mobility (R26.89) Pain - part of body: Leg    Time: 4010-2725 PT Time Calculation (min) (ACUTE ONLY): 31 min   Charges:   PT Evaluation $PT Eval Low Complexity: 1 Low PT Treatments $Therapeutic Activity: 8-22 mins PT General Charges $$ ACUTE PT VISIT: 1 Visit           Faye Ramsay, PT Acute Rehabilitation  Office: [phone]

## 2023-09-05 NOTE — Progress Notes (Addendum)
 PROGRESS NOTE Jordan [name]  MWN:[id number]  DOB: [birth year]  DOA: 09/01/2023 PCP: Kennith Center, NP  Hospital course: 73 y.o. male with HTN, HFpEF, A-fib, CAD, COPD with chronic hypoxic respiratory failure on 2 L O2 via [employer], chronic venous stasis disease and PVD was admitted yesterday for worsening cellulitis failing outpatient treatment after injury at nursing home the previous week. Describes an injury to LLE with a transfer in facility.   Dispo recommendations have been updated to SNF.  Subjective: Feels improved today. Denies leg pain at rest. Has not been out of bed.   Objective: Blood pressure 116/79, pulse 95, temperature 97.7 F (36.5 C), resp. rate 14, height 6\' 1"  (1.854 m), weight 80.7 kg, SpO2 90%.  Exam: General: Patient resting, NAD Eyes: sclera anicteric, conjuctiva mild injection bilaterally CVS: S1-S2, regular  Respiratory:  normal inspiratory effort, CTAB GI: NABS, soft, NT  LE: Patient with bilateral circumferential hyperkeratotic and hyperpigmented changes consistent with venous stasis disease. Several bandages in place and clean Neuro: A/O x 3,  grossly nonfocal.  Psych: patient is logical and coherent, judgement and insight appear normal, mood and affect appropriate to situation.  Data Reviewed:    Latest Ref Rng & Units 09/05/2023    4:44 AM 09/04/2023    4:07 AM 09/03/2023    4:40 AM  BMP  Glucose 70 - 99 mg/dL 96  403  90   BUN 8 - 23 mg/dL 43  37  41   Creatinine 0.61 - 1.24 mg/dL 4.74  2.59  5.63   Sodium 135 - 145 mmol/L 135  138  137   Potassium 3.5 - 5.1 mmol/L 4.1  3.5  3.5   Chloride 98 - 111 mmol/L 105  109  107   CO2 22 - 32 mmol/L 22  22  21    Calcium 8.9 - 10.3 mg/dL 7.9  7.7  7.7       Latest Ref Rng & Units 09/04/2023    4:07 AM 09/03/2023    4:40 AM 09/02/2023    5:25 AM  CBC  WBC 4.0 - 10.5 K/uL 11.6  11.2  12.1   Hemoglobin 13.0 - 17.0 g/dL 7.4  7.4  7.3   Hematocrit 39.0 - 52.0 % 27.1  26.5  26.6   Platelets 150 - 400 K/uL  241  311  296    Assessment & Plan:   Soft tissue infection Chronic venous stasis disease PVD. LE venous doppler negative for DVT.  Concern that patient is not going to heal well with suspected PAD. Ordering ABI today. Incomplete due to patient injuries and pain intolerance.  - consider vascular surgery consult Continue antibiotics: vancomycin changed to keflex Awaiting ALF dc. Pending wound care at facility  Continue wound care: Paint RLE wounds with betadine, allow to air dry, cover with foam Cleanse LLE wounds and toe wound with saline, pat dry Cover with single layer of xeroform gauze, top with foam. Change daily   AKI- Cr 2.53>>2.05> 2.5 Creatinine worsened today. Hesitant to give more IV fluids on given overall bedbound and euvolemic to mildly hypervolemic on exam without pitting edema. Wonder if trial of lasix would improve or harm.  - encourage PO fluid hydration Follow up tomorrow   Hypokalemia Normalized with repletion   HTN- low/normal Continue atenolol 50mg  dose Hold home doses of Lasix  Atrial fibrillation S/PE Eliquis  HFpEF  S/p TAVR - increase to home atenolol 50mg  dose for better HR control Continue Eliquis for secondary stroke prevention  Microcytic anemia Last known hemoglobin was 8.6 in October 2023. Stable around 7.4 Anemia labs suggestive of ACD   COPD Chronic hypoxic respiratory failure on 2 L home O2 Continue home inhaled bronchodilators No evidence for acute flare. Stable ORA at rest   Anxiety and depression - Effexor and lorazepam per home doses  DVT prophylaxis: Eliquis Code Status: Full Family Communication: None today   Leeroy Bock, [facility]  If 7PM-7AM, please contact night-coverage [email or web address]   LOS: 4 days

## 2023-09-06 ENCOUNTER — Inpatient Hospital Stay (HOSPITAL_COMMUNITY)

## 2023-09-06 DIAGNOSIS — I4891 Unspecified atrial fibrillation: Secondary | ICD-10-CM

## 2023-09-06 DIAGNOSIS — L089 Local infection of the skin and subcutaneous tissue, unspecified: Secondary | ICD-10-CM | POA: Diagnosis not present

## 2023-09-06 LAB — CULTURE, BLOOD (ROUTINE X 2)
Culture: NO GROWTH
Culture: NO GROWTH
Special Requests: ADEQUATE
Special Requests: ADEQUATE

## 2023-09-06 LAB — COMPREHENSIVE METABOLIC PANEL
ALT: 16 U/L (ref 0–44)
AST: 24 U/L (ref 15–41)
Albumin: 2 g/dL — ABNORMAL LOW (ref 3.5–5.0)
Alkaline Phosphatase: 128 U/L — ABNORMAL HIGH (ref 38–126)
Anion gap: 7 (ref 5–15)
BUN: 39 mg/dL — ABNORMAL HIGH (ref 8–23)
CO2: 21 mmol/L — ABNORMAL LOW (ref 22–32)
Calcium: 7.7 mg/dL — ABNORMAL LOW (ref 8.9–10.3)
Chloride: 104 mmol/L (ref 98–111)
Creatinine, Ser: 2.69 mg/dL — ABNORMAL HIGH (ref 0.61–1.24)
GFR, Estimated: 24 mL/min — ABNORMAL LOW (ref >60)
Glucose, Bld: 89 mg/dL (ref 70–99)
Potassium: 4.5 mmol/L (ref 3.5–5.1)
Sodium: 132 mmol/L — ABNORMAL LOW (ref 135–145)
Total Bilirubin: 0.6 mg/dL (ref 0.0–1.2)
Total Protein: 6 g/dL — ABNORMAL LOW (ref 6.5–8.1)

## 2023-09-06 LAB — CBC
HCT: 27.3 % — ABNORMAL LOW (ref 39.0–52.0)
Hemoglobin: 7.5 g/dL — ABNORMAL LOW (ref 13.0–17.0)
MCH: 21.1 pg — ABNORMAL LOW (ref 26.0–34.0)
MCHC: 27.5 g/dL — ABNORMAL LOW (ref 30.0–36.0)
MCV: 76.7 fL — ABNORMAL LOW (ref 80.0–100.0)
Platelets: 252 10*3/uL (ref 150–400)
RBC: 3.56 MIL/uL — ABNORMAL LOW (ref 4.22–5.81)
RDW: 20.9 % — ABNORMAL HIGH (ref 11.5–15.5)
WBC: 10.1 10*3/uL (ref 4.0–10.5)
nRBC: 0 % (ref 0.0–0.2)

## 2023-09-06 MED ORDER — IPRATROPIUM-ALBUTEROL 0.5-2.5 (3) MG/3ML IN SOLN
3.0000 mL | Freq: Four times a day (QID) | RESPIRATORY_TRACT | Status: DC
  Administered 2023-09-06 – 2023-09-07 (×5): 3 mL via RESPIRATORY_TRACT
  Filled 2023-09-06 (×5): qty 3

## 2023-09-06 MED ORDER — FUROSEMIDE 10 MG/ML IJ SOLN
40.0000 mg | Freq: Two times a day (BID) | INTRAMUSCULAR | Status: DC
  Administered 2023-09-06 – 2023-09-07 (×2): 40 mg via INTRAVENOUS
  Filled 2023-09-06 (×2): qty 4

## 2023-09-06 NOTE — Evaluation (Signed)
 Occupational Therapy Evaluation Patient Details Name: Jordan [name] MRN: [id number] DOB: May 24, 1951 Today's Date: 09/06/2023   History of Present Illness   73 yo male admitted with soft tissue infection/L LE wound, L toe laceration after sustaining a fall at ALF while be transferred back to bed per pt report. 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 PTA pt lived at an ALF and reports that he requires assist with LB ADLs and toileting, pt reports that he does not walk and requires assist with SPTs to w/c and toilet, staff take shim to dining room in w/c.  Pt initially agreeable to sit EOB and SPTs to chair, however pt insisted upon having LE dressing changed while still in bed and afterwards refused to sit EOBOOB. OT assisted RN with pt rolling in bed for posterior skin checks and LE wound care. Recommend [facility] acute rehab at [facility] after acute care stay, OT will follow acutely to maximize level of function and safety     If plan is discharge home, recommend the following:   Two people to help with walking and/or transfers;Two people to help with bathing/dressing/bathroom;Assist for transportation;Direct supervision/assist for medications management;Help with stairs or ramp for entrance     Functional Status Assessment   Patient has had a recent decline in their functional status and demonstrates the ability to make significant improvements in function in a reasonable and predictable amount of time.     Equipment Recommendations   None recommended by OT     Recommendations for Other Services         Precautions/Restrictions   Precautions Precautions: Fall Precaution/Restrictions  Comments: multiple LE wounds-tender to touch Restrictions Weight Bearing Restrictions Per Provider Order: No     Mobility Bed Mobility Overal bed mobility: Needs Assistance Bed Mobility: Rolling Rolling: Max assist, +2 for physical assistance         General bed mobility comments: OT assisted RN with pt rolling in bed for posterior skin checks and LE wound care. Pt initially agreeable to sit EOB and SPTs to chair, however pt insisted upon having LE dressing changed while still in bed and afterwards refused to sit EOB    Transfers                   General transfer comment: pt refused due to pain and fatigue      Balance                                           ADL either performed or assessed with clinical judgement   ADL Overall ADL's : Needs assistance/impaired Eating/Feeding: Set up;Sitting;Bed level   Grooming: Wash/dry hands;Wash/dry face;Supervision/safety;Sitting;Bed level   Upper Body Bathing: Moderate assistance;Bed level Upper Body Bathing Details (indicate cue type and reason): simulated Lower Body Bathing: Total assistance   Upper Body Dressing : Moderate assistance;Bed level   Lower Body Dressing: Total assistance     Toilet Transfer Details (indicate cue type and reason): pt declined Toileting- Clothing Manipulation and Hygiene: Total assistance;Bed level         General ADL  Comments: OT assisted RN with pt rolling in bed for posterior skin checks and LE wound care. Pt initially agreeable to sit EOB and SPTs to chair, however pt insisted upon having LE dressing changed while still in bed and afterwards refused to sit EOB/OOB activity     Vision Baseline Vision/History: 1 Wears glasses Ability to See in Adequate Light: 0 Adequate Patient Visual Report: No change from baseline       Perception         Praxis         Pertinent Vitals/Pain Pain Assessment Pain Assessment: 0-10 Pain Score: 8  Pain Location: B  LEs Pain Descriptors / Indicators: Tender, Grimacing, Guarding, Moaning Pain Intervention(s): Limited activity within patient's tolerance, Monitored during session, Premedicated before session, Repositioned     Extremity/Trunk Assessment Upper Extremity Assessment Upper Extremity Assessment: Generalized weakness   Lower Extremity Assessment Lower Extremity Assessment: Defer to PT evaluation       Communication Communication Communication: No apparent difficulties   Cognition Arousal: Alert Behavior During Therapy: WFL for tasks assessed/performed Cognition: No apparent impairments                                       Cueing  General Comments          Exercises     Shoulder Instructions      Home Living Family/patient expects to be discharged to:: Assisted living                             Home Equipment: Wheelchair - Forensic psychologist (2 wheels);Cane - single point;BSC/3in1          Prior Functioning/Environment Prior Level of Function : Needs assist             Mobility Comments: assistance with transfers to/from [facility], pt reports that he does not walk ADLs Comments: assist with LB ADLs and toileting, staff propels pt to dining room    OT Problem List: Decreased strength;Pain;Decreased activity tolerance   OT Treatment/Interventions: Self-care/ADL training;DME and/or AE instruction;Therapeutic activities;Balance training;Therapeutic exercise;Patient/family education      OT Goals(Current goals can be found in the care plan section)   Acute Rehab OT Goals Patient Stated Goal: get well OT Goal Formulation: With patient Time For Goal Achievement: 09/20/23 Potential to Achieve Goals: Good ADL Goals Pt Will Perform Grooming: with contact guard assist;with supervision;sitting Pt Will Perform Upper Body Bathing: with min assist;with contact guard assist;sitting Pt Will Perform Upper Body Dressing: with min assist;with  contact guard assist;sitting Pt Will Transfer to Toilet: with max assist;with mod assist;with +2 assist;stand pivot transfer;bedside commode   OT Frequency:  Min 1X/week    Co-evaluation              AM-PAC OT "6 Clicks" Daily Activity     Outcome Measure Help from another person eating meals?: A Little Help from another person taking care of personal grooming?: A Little Help from another person toileting, which includes using toliet, bedpan, or urinal?: Total Help from another person bathing (including washing, rinsing, drying)?: Total Help from another person to put on and taking off regular upper body clothing?: A Lot Help from another person to put on and taking off regular lower body clothing?: Total 6 Click Score: 11   End of Session    Activity Tolerance: Patient limited by pain;Patient  limited by fatigue Patient left: in bed;with nursing/sitter in room  OT Visit Diagnosis: Other abnormalities of gait and mobility (R26.89);Muscle weakness (generalized) (M62.81);Pain Pain - part of body:  (B LEs)                Time: 3244-0102 OT Time Calculation (min): 26 min Charges:  OT General Charges $OT Visit: 1 Visit OT Evaluation $OT Eval Moderate Complexity: 1 Mod OT Treatments $Therapeutic Activity: 8-22 mins   Galen Manila 09/06/2023, 1:23 PM

## 2023-09-06 NOTE — TOC Progression Note (Signed)
 Transition of Care [facility]) - Progression Note    Patient Details  Name: Jordan [name] MRN: [id number] Date of Birth: Jul 25, 1950  Transition of Care [facility]) CM/SW Contact  Amada Jupiter, LCSW Phone Number: 09/06/2023, 4:42 PM  Clinical Narrative:     Have reviewed SNF bed offers with pt and he has chosen bed at [facility] - awaiting confirmation from facility that bed offer is solid.  Per MD, pt not yet medically ready.  TOC will continue to follow/ confirm bed with [facility] and begin insurance auth.  Expected Discharge Plan: Assisted Living Barriers to Discharge: Continued Medical Work up  Expected Discharge Plan and Services In-house Referral: Clinical Social Work     Living arrangements for the past 2 months: Assisted Living Facility                                       Social Determinants of Health (SDOH) Interventions SDOH Screenings   Food Insecurity: Patient Declined (09/01/2023)  Housing: Patient Declined (09/01/2023)  Transportation Needs: Patient Declined (09/01/2023)  Utilities: Patient Declined (09/01/2023)  Social Connections: Patient Declined (09/01/2023)  Tobacco Use: High Risk (09/01/2023)    Readmission Risk Interventions    09/04/2023    2:49 PM 04/04/2022   11:02 AM  Readmission Risk Prevention Plan  Transportation Screening Complete Complete  PCP or Specialist Appt within 5-7 Days Complete   PCP or Specialist Appt within 3-5 Days  Complete  Home Care Screening Complete   Medication Review (RN CM) Complete   HRI or Home Care Consult  Complete  Social Work Consult for Recovery Care Planning/Counseling  Complete  Palliative Care Screening  Not Applicable  Medication Review Oceanographer)  Complete

## 2023-09-06 NOTE — Progress Notes (Signed)
 PROGRESS NOTE    Jordan [name]  WGN:[id number] DOB: January 02, 1951 DOA: 09/01/2023 PCP: Kennith Center, NP   Brief Narrative: 73 year old with past medical history significant for hypertension, heart failure preserved ejection fraction, A-fib, CAD, COPD with chronic hypoxic respiratory failure on 2 L of oxygen, chronic venous stasis, PVD admitted 3/17 for worsening cellulitis failing outpatient antibiotics, after injury at nursing home previous week.     Assessment & Plan:   Principal Problem:   Soft tissue infection   1-Soft tissue infection, LE infection, ulcers.  Chronic venous stasis disease PVD -ABI: Right: Resting right ankle-brachial index is within normal range. Unable to obtain TBI due to great toe ulceration.  Left:  Unable to obtain ABI due to patient pain tolerance and movement.  Monophasic waveforms are detected in the dorsalis pedis and posterior  tibial arteries.  Unable to obtain TBI due to great toe ulceration.  -Continue local care.  -Antibiotics were change to Keflex.  -Vascular consulted.   AKI -Previous cr 0.9 (2023) -Creatinine increasing today at 2.6 -Renal ultrasound obtained negative for hydronephrosis -Strict I's and O's -Nephrology consulted patient will be started on IV Lasix -Chest x-ray with some pulmonary edema  Hypokalemia: Resolved  Hypertension: Continue atenolol to avoid hypotension  A-fib: Continue Eliquis  Macrocytic anemia: Continue to monitor hemoglobin, transfuse for hemoglobin less than 7   COPD Chronic hypoxic respiratory failure on 2 L of oxygen Wheezing, started scheduled albuterol  Anxiety depression: Continue Effexor and Ativan   Pressure Injury 09/02/23 Heel Left Unstageable - Full thickness tissue loss in which the base of the injury is covered by slough (yellow, tan, gray, green or brown) and/or eschar (tan, brown or black) in the wound bed. (Active)  09/02/23 1713  Location: Heel  Location Orientation: Left   Staging: Unstageable - Full thickness tissue loss in which the base of the injury is covered by slough (yellow, tan, gray, green or brown) and/or eschar (tan, brown or black) in the wound bed.  Wound Description (Comments):   Present on Admission: Yes  Dressing Type Foam - Lift dressing to assess site every shift 09/04/23 2103                  Estimated body mass index is 23.48 kg/m as calculated from the following:   Height as of this encounter: 6\' 1"  (1.854 m).   Weight as of this encounter: 80.7 kg.   DVT prophylaxis: Eliquis Code Status: Full code Family Communication: Discussed with patient Disposition Plan:  Status is: Inpatient Remains inpatient appropriate because: Management of AKI and lower extremity wounds    Consultants:  Nephrology Vascular  Procedures:  Renal US  Antimicrobials:    Subjective: Patient report he hit his leg at the rehab, when he was being transported in the rehab  Objective: Vitals:   09/05/23 1926 09/05/23 2203 09/06/23 0538 09/06/23 0731  BP:  114/72 124/62   Pulse:  97 (!) 101   Resp:  18 18   Temp:  (!) 97.5 F (36.4 C) 97.9 F (36.6 C)   TempSrc:  Oral    SpO2: 94% 90% 92% 92%  Weight:      Height:        Intake/Output Summary (Last 24 hours) at 09/06/2023 0804 Last data filed at 09/06/2023 0600 Gross per 24 hour  Intake 600 ml  Output 450 ml  Net 150 ml   Filed Weights   09/01/23 1702  Weight: 80.7 kg    Examination:  General exam: Appears calm and comfortable  Respiratory system: BL wheezing Respiratory effort normal. Cardiovascular system: S1 & S2 heard, RRR. No JVD, murmurs, rubs, gallops or clicks. No pedal edema. Gastrointestinal system: Abdomen is nondistended, soft and nontender. No organomegaly or masses felt. Normal bowel sounds heard. Central nervous system: Alert and oriented. No focal neurological deficits. Extremities: BL LE ulceration, toe with ulceration   Data Reviewed: I have  personally reviewed following labs and imaging studies  CBC: Recent Labs  Lab 09/01/23 1225 09/02/23 0525 09/03/23 0440 09/04/23 0407 09/06/23 0458  WBC 14.2* 12.1* 11.2* 11.6* 10.1  NEUTROABS 11.4*  --   --  9.1*  --   HGB 7.8* 7.3* 7.4* 7.4* 7.5*  HCT 27.9* 26.6* 26.5* 27.1* 27.3*  MCV 77.3* 78.5* 77.0* 78.1* 76.7*  PLT 265 296 311 241 252   Basic Metabolic Panel: Recent Labs  Lab 09/02/23 0525 09/03/23 0440 09/04/23 0407 09/05/23 0444 09/06/23 0458  NA 135 137 138 135 132*  K 3.6 3.5 3.5 4.1 4.5  CL 103 107 109 105 104  CO2 24 21* 22 22 21*  GLUCOSE 79 90 114* 96 89  BUN 42* 41* 37* 43* 39*  CREATININE 2.53* 2.11* 2.05* 2.50* 2.69*  CALCIUM 7.7* 7.7* 7.7* 7.9* 7.7*   GFR: Estimated Creatinine Clearance: 27.6 mL/min (A) (by C-G formula based on SCr of 2.69 mg/dL (H)). Liver Function Tests: Recent Labs  Lab 09/01/23 1225 09/02/23 0525 09/06/23 0458  AST 29 27 24   ALT 20 18 16   ALKPHOS 145* 128* 128*  BILITOT 1.3* 0.9 0.6  PROT 5.8* 5.4* 6.0*  ALBUMIN 2.3* 2.0* 2.0*   No results for input(s): "LIPASE", "AMYLASE" in the last 168 hours. No results for input(s): "AMMONIA" in the last 168 hours. Coagulation Profile: No results for input(s): "INR", "PROTIME" in the last 168 hours. Cardiac Enzymes: No results for input(s): "CKTOTAL", "CKMB", "CKMBINDEX", "TROPONINI" in the last 168 hours. BNP (last 3 results) No results for input(s): "PROBNP" in the last 8760 hours. HbA1C: No results for input(s): "HGBA1C" in the last 72 hours. CBG: No results for input(s): "GLUCAP" in the last 168 hours. Lipid Profile: No results for input(s): "CHOL", "HDL", "LDLCALC", "TRIG", "CHOLHDL", "LDLDIRECT" in the last 72 hours. Thyroid Function Tests: No results for input(s): "TSH", "T4TOTAL", "FREET4", "T3FREE", "THYROIDAB" in the last 72 hours. Anemia Panel: No results for input(s): "VITAMINB12", "FOLATE", "FERRITIN", "TIBC", "IRON", "RETICCTPCT" in the last 72 hours. Sepsis  Labs: Recent Labs  Lab 09/01/23 1228 09/01/23 1415  LATICACIDVEN 1.4 1.2    Recent Results (from the past 240 hours)  Culture, blood (routine x 2)     Status: None   Collection Time: 09/01/23 12:25 PM   Specimen: BLOOD LEFT ARM  Result Value Ref Range Status   Specimen Description   Final    BLOOD LEFT ARM Performed at [facility], [address], [place], [place] [address]    Special Requests   Final    BOTTLES DRAWN AEROBIC AND ANAEROBIC Blood Culture adequate volume Performed at [facility], [address], [place], [place] [address]    Culture   Final    NO GROWTH 5 DAYS Performed at [facility], [address], [place], [place] [address]    Report Status 09/06/2023 FINAL  Final  Culture, blood (routine x 2)     Status: None   Collection Time: 09/01/23  1:18 PM   Specimen: BLOOD RIGHT FOREARM  Result Value Ref Range  Status   Specimen Description   Final    BLOOD RIGHT FOREARM Performed at [facility], [address], [place], [place] [address]    Special Requests   Final    BOTTLES DRAWN AEROBIC AND ANAEROBIC Blood Culture adequate volume Performed at [facility], [address], [place], [place] [address]    Culture   Final    NO GROWTH 5 DAYS Performed at [facility], [address], [place], [place] [address]    Report Status 09/06/2023 FINAL  Final  Resp panel by RT-PCR (RSV, Flu A&B, Covid) Anterior Nasal Swab     Status: None   Collection Time: 09/01/23  1:18 PM   Specimen: Anterior Nasal Swab  Result Value Ref Range Status   SARS Coronavirus 2 by RT PCR NEGATIVE NEGATIVE Final    Comment: (NOTE) SARS-CoV-2 target nucleic acids are NOT DETECTED.  The SARS-CoV-2 RNA is generally detectable in upper respiratory specimens during the acute phase of infection. The lowest concentration of SARS-CoV-2 viral copies this assay can detect is 138 copies/mL. A negative result does not preclude  SARS-Cov-2 infection and should not be used as the sole basis for treatment or other patient management decisions. A negative result may occur with  improper specimen collection/handling, submission of specimen other than nasopharyngeal swab, presence of viral mutation(s) within the areas targeted by this assay, and inadequate number of viral copies(<138 copies/mL). A negative result must be combined with clinical observations, patient history, and epidemiological information. The expected result is Negative.  Fact Sheet for Patients:  BloggerCourse.com  Fact Sheet for Healthcare Providers:  SeriousBroker.it  This test is no t yet approved or cleared by the [place] FDA and  has been authorized for detection and/or diagnosis of SARS-CoV-2 by FDA under an Emergency Use Authorization (EUA). This EUA will remain  in effect (meaning this test can be used) for the duration of the COVID-19 declaration under Section 564(b)(1) of the Act, 21 U.S.C.section 360bbb-3(b)(1), unless the authorization is terminated  or revoked sooner.       Influenza A by PCR NEGATIVE NEGATIVE Final   Influenza B by PCR NEGATIVE NEGATIVE Final    Comment: (NOTE) The Xpert Xpress SARS-CoV-2/FLU/RSV plus assay is intended as an aid in the diagnosis of influenza from Nasopharyngeal swab specimens and should not be used as a sole basis for treatment. Nasal washings and aspirates are unacceptable for Xpert Xpress SARS-CoV-2/FLU/RSV testing.  Fact Sheet for Patients: BloggerCourse.com  Fact Sheet for Healthcare Providers: SeriousBroker.it  This test is not yet approved or cleared by the [place] FDA and has been authorized for detection and/or diagnosis of SARS-CoV-2 by FDA under an Emergency Use Authorization (EUA). This EUA will remain in effect (meaning this test can be used) for the duration of  the COVID-19 declaration under Section 564(b)(1) of the Act, 21 U.S.C. section 360bbb-3(b)(1), unless the authorization is terminated or revoked.     Resp Syncytial Virus by PCR NEGATIVE NEGATIVE Final    Comment: (NOTE) Fact Sheet for Patients: BloggerCourse.com  Fact Sheet for Healthcare Providers: SeriousBroker.it  This test is not yet approved or cleared by the [place] FDA and has been authorized for detection and/or diagnosis of SARS-CoV-2 by FDA under an Emergency Use Authorization (EUA). This EUA will remain in effect (meaning this test can be used) for the duration of the COVID-19 declaration under Section 564(b)(1) of the Act, 21 U.S.C. section 360bbb-3(b)(1), unless the authorization is terminated  or revoked.  Performed at [facility], [address] [place], [place] [address]   MRSA Next Gen by PCR, Nasal     Status: None   Collection Time: 09/02/23 11:45 AM   Specimen: Nasal Mucosa; Nasal Swab  Result Value Ref Range Status   MRSA by PCR Next Gen NOT DETECTED NOT DETECTED Final    Comment: (NOTE) The GeneXpert MRSA Assay (FDA approved for NASAL specimens only), is one component of a comprehensive MRSA colonization surveillance program. It is not intended to diagnose MRSA infection nor to guide or monitor treatment for MRSA infections. Test performance is not FDA approved in patients less than 34 years old. Performed at [facility], [address], [place], [place] [address]          Radiology Studies: VAS [place] ABI WITH/WO TBI Result Date: 09/05/2023  LOWER EXTREMITY DOPPLER STUDY Patient Name:  Jordan [name]  Date of Exam:   09/05/2023 Medical Rec #: [id number]          Accession #:    [id number] Date of Birth: 10/13/1950          Patient Gender: M Patient Age:   53 years Exam Location:  [facility] Procedure:      VAS [place] ABI WITH/WO TBI Referring Phys:  Jamelle Rushing --------------------------------------------------------------------------------  Indications: Peripheral artery disease. High Risk Factors: Hypertension, hyperlipidemia, Diabetes.  Limitations: Today's exam was limited due to patient positioning, patient              intolerant to cuff pressure, an open wound, bandages and              involuntary patient movement. Comparison Study: No prior studies. Performing Technologist: Olen Cordial RVT  Examination Guidelines: A complete evaluation includes at minimum, Doppler waveform signals and systolic blood pressure reading at the level of bilateral brachial, anterior tibial, and posterior tibial arteries, when vessel segments are accessible. Bilateral testing is considered an integral part of a complete examination. Photoelectric Plethysmograph (PPG) waveforms and toe systolic pressure readings are included as required and additional duplex testing as needed. Limited examinations for reoccurring indications may be performed as noted.  ABI Findings: +---------+------------------+-----+-----------+--------+ Right    Rt Pressure (mmHg)IndexWaveform   Comment  +---------+------------------+-----+-----------+--------+ Brachial 119                    triphasic           +---------+------------------+-----+-----------+--------+ PTA      137               1.15 multiphasic         +---------+------------------+-----+-----------+--------+ DP       132               1.11 multiphasic         +---------+------------------+-----+-----------+--------+ Great Toe                                  Ulcer    +---------+------------------+-----+-----------+--------+ +---------+------------------+-----+----------+-------+ Left     Lt Pressure (mmHg)IndexWaveform  Comment +---------+------------------+-----+----------+-------+ Brachial 101                    triphasic         +---------+------------------+-----+----------+-------+  PTA  monophasic        +---------+------------------+-----+----------+-------+ DP                              monophasic        +---------+------------------+-----+----------+-------+ Great Toe                                 Ulcer   +---------+------------------+-----+----------+-------+ +-------+-----------+-----------+------------+------------+ ABI/TBIToday's ABIToday's TBIPrevious ABIPrevious TBI +-------+-----------+-----------+------------+------------+ Right  1.15                                           +-------+-----------+-----------+------------+------------+   Summary: Right: Resting right ankle-brachial index is within normal range. Unable to obtain TBI due to great toe ulceration. Left: Unable to obtain ABI due to patient pain tolerance and movement. Monophasic waveforms are detected in the dorsalis pedis and posterior tibial arteries. Unable to obtain TBI due to great toe ulceration. *See table(s) above for measurements and observations.  Electronically signed by Lemar Livings MD on 09/05/2023 at 3:52:44 PM.    Final         Scheduled Meds:  apixaban  5 mg Oral BID   atenolol  50 mg Oral Daily   atorvastatin  80 mg Oral QODAY   cephALEXin  500 mg Oral Q8H   LORazepam  1 mg Oral BID   mometasone-formoterol  2 puff Inhalation BID   multivitamin with minerals  1 tablet Oral Q breakfast   pantoprazole  40 mg Oral Daily   venlafaxine XR  75 mg Oral Q breakfast   Continuous Infusions:   LOS: 5 days    Time spent: 35 minutes    Slyvester Latona A Savio Albrecht, MD [facility]   If 7PM-7AM, please contact night-coverage [email or web address]  09/06/2023, 8:04 AM

## 2023-09-06 NOTE — H&P (View-Only) (Signed)
 [facility] [place]  Patient name: Jordan [name] MRN: [id number] DOB: November 25, 1950 Sex: male   REQUESTING PROVIDER:    Hospital service   REASON FOR CONSULT:    Leg wounds  HISTORY OF PRESENT ILLNESS:   Jordan [name] is a 73 y.o. male, who was admitted to the hospital on 09/01/2023 for worsening leg pain after a injury.  Patient resides at a skilled nursing facility.  He has chronic venous stasis disease bilaterally.  He suffered a skin tear to his left leg during transfer from his wheelchair to the bed.  He failed outpatient antibiotics and was admitted with worsening redness andPain.  He did have a white count of 14,000 on admission and elevated creatinine pain to 2.4.  He was admitted for IVAntibiotics and wound care.  Patient suffers from chronic heart failure.  He has atrial fibrillation on anticoagulation.  He has a history of coronary artery disease.  He has undergone TAVR procedure.  He is on 2 L of nasal cannula oxygen.  He is an occasional smoker.  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 Excell Seltzer and Dr. Laneta Simmers   Severe aortic stenosis    Severe aortic insufficiency, moderate to severe aortic stenosis   Sleep apnea    no longer uses cpap   Venous insufficiency    managed by ortho (Dr. 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     Social Drivers of Corporate investment banker Strain: Not on file  Food Insecurity: Patient Declined (09/01/2023)   Hunger Vital Sign    Worried About Running Out of Food in the Last Year: Patient declined    Ran Out of Food in the Last Year: Patient declined  Transportation Needs: Patient Declined (09/01/2023)   PRAPARE - Administrator, Civil Service (Medical): Patient declined    Lack of Transportation (Non-Medical): Patient declined  Physical Activity: Not on file  Stress: Not on file  Social Connections: Patient Declined (09/01/2023)   Social Connection and Isolation Panel [NHANES]    Frequency of Communication with Friends and Family: Patient declined    Frequency of Social Gatherings with Friends and Family: Patient declined    Attends Religious Services: Patient declined    Automotive engineer or Organizations: Patient declined    Attends Banker Meetings: Patient declined    Marital Status: Patient declined  Intimate Partner Violence: Patient Declined (09/01/2023)   Humiliation, Afraid, Rape, and Kick questionnaire    Fear of Current or Ex-Partner: Patient declined    Emotionally Abused: Patient declined    Physically Abused: Patient declined    Sexually Abused: Patient declined    ALLERGIES:    Allergies  Allergen Reactions   Altace [Ramipril] Other (See Comments)    Dizziness, migraine, weakness- "there is dye in it"   Codeine Itching   Metoprolol Tartrate Other (See Comments)    Dropped the B/P too low- eventually stopped by a MD   Naproxen Itching    CURRENT MEDICATIONS:    Current Facility-Administered Medications  Medication Dose Route Frequency Provider Last Rate Last Admin   acetaminophen (TYLENOL) tablet 650 mg  650 mg Oral Q6H PRN Leandro Reasoner Tublu, MD   650 mg at 09/04/23 1416   Or   acetaminophen (TYLENOL) suppository 650 mg  650 mg Rectal Q6H PRN Pieter Partridge, MD       alum & mag hydroxide-simeth (MAALOX/MYLANTA) 200-200-20 MG/5ML suspension 30 mL  30 mL Oral Q4H PRN Leandro Reasoner Tublu, MD   30 mL at 09/03/23 2223   apixaban (ELIQUIS) tablet 5 mg  5 mg Oral BID Leandro Reasoner Tublu, MD   5 mg at 09/06/23 1006   atenolol (TENORMIN) tablet 50 mg  50 mg Oral Daily Jamelle Rushing L, MD   50 mg at 09/06/23 1007   atorvastatin (LIPITOR) tablet 80 mg  80 mg Oral Corinna Capra Tublu, MD   80 mg at 09/05/23 0800   cephALEXin (KEFLEX) capsule 500 mg  500 mg Oral Q8H Jamelle Rushing L, MD   500 mg at 09/06/23 0528   ipratropium-albuterol (DUONEB) 0.5-2.5 (3) MG/3ML nebulizer solution 3 mL  3 mL Nebulization Q6H Regalado, Belkys A, MD   3 mL at 09/06/23 1132   LORazepam (ATIVAN) tablet 1 mg  1 mg Oral BID Leandro Reasoner Tublu, MD   1 mg at 09/06/23 1007   melatonin tablet 5 mg  5 mg  Oral QHS PRN Leandro Reasoner Tublu, MD   5 mg at 09/05/23 2100   mometasone-formoterol (DULERA) 200-5 MCG/ACT inhaler 2 puff  2 puff Inhalation BID Pieter Partridge, MD   2 puff at 09/06/23 0731   multivitamin with minerals tablet 1 tablet  1 tablet Oral Q breakfast Leandro Reasoner Tublu,  MD   1 tablet at 09/06/23 1007   ondansetron (ZOFRAN) injection 4 mg  4 mg Intravenous Q8H PRN Leeroy Bock, MD   4 mg at 09/04/23 1610   oxyCODONE (Oxy IR/ROXICODONE) immediate release tablet 5 mg  5 mg Oral BID PRN Leeroy Bock, MD   5 mg at 09/05/23 2101   pantoprazole (PROTONIX) EC tablet 40 mg  40 mg Oral Daily Leandro Reasoner Tublu, MD   40 mg at 09/06/23 1007   polyethylene glycol (MIRALAX / GLYCOLAX) packet 17 g  17 g Oral Daily PRN Pieter Partridge, MD       traMADol Janean Sark) tablet 50 mg  50 mg Oral BID PRN Pieter Partridge, MD   50 mg at 09/06/23 0151   venlafaxine XR (EFFEXOR-XR) 24 hr capsule 75 mg  75 mg Oral Q breakfast Pieter Partridge, MD   75 mg at 09/06/23 1007    REVIEW OF SYSTEMS:   [X]  denotes positive finding, [ ]  denotes negative finding Cardiac  Comments:  Chest pain or chest pressure:    Shortness of breath upon exertion:    Short of breath when lying flat:    Irregular heart rhythm:        Vascular    Pain in calf, thigh, or hip brought on by ambulation:    Pain in feet at night that wakes you up from your sleep:     Blood clot in your veins:    Leg swelling:         Pulmonary    Oxygen at home:    Productive cough:     Wheezing:         Neurologic    Sudden weakness in arms or legs:     Sudden numbness in arms or legs:     Sudden onset of difficulty speaking or slurred speech:    Temporary loss of vision in one eye:     Problems with dizziness:         Gastrointestinal    Blood in stool:      Vomited blood:         Genitourinary    Burning when urinating:     Blood in urine:        Psychiatric     Major depression:         Hematologic    Bleeding problems:    Problems with blood clotting too easily:        Skin    Rashes or ulcers:        Constitutional    Fever or chills:     PHYSICAL EXAM:   Vitals:   09/06/23 1009 09/06/23 1044 09/06/23 1132 09/06/23 1333  BP: 113/64   113/84  Pulse: 94   (!) 107  Resp:  18  18  Temp:  98.3 F (36.8 C)    TempSrc:  Oral    SpO2:   92%   Weight:      Height:        GENERAL: The patient is a well-nourished male, in no acute distress. The vital signs are documented above. CARDIAC: There is a regular rate and rhythm.  VASCULAR: Palpable pedal pulses PULMONARY: Nonlabored respirations ABDOMEN: Soft and non-tender with normal pitched bowel sounds.  MUSCULOSKELETAL: There are no major deformities or cyanosis. NEUROLOGIC: No focal weakness or paresthesias are detected. SKIN: See photos below PSYCHIATRIC: The patient has a normal affect.     STUDIES:   I  have reviewed the following : Right: Resting right ankle-brachial index is within normal range.   Unable to obtain TBI due to great toe ulceration.  Left:   Unable to obtain ABI due to patient pain tolerance and movement.  Monophasic waveforms are detected in the dorsalis pedis and posterior  tibial arteries.  Unable to obtain TBI, and MRI of her great toe ulceration.   ASSESSMENT and PLAN   Bilateral venous stasis disease: The left leg recently suffered a tear and he has a large wound here.  He underwent ABIs yesterday which were abnormal particularly on the left.  In addition to compression, elevation, and antibiotics, I feel he would benefit from angiography to further evaluate blood flow to his left leg.  Hopefully he will have correctable disease which would expedite wound healing.  He is on Eliquis and so this will need to be discontinued prior to his procedure.  I will work to get this arranged for next week likely on Tuesday.  This can be performed as an outpatient  procedure so if he is discharged, he can come in on Tuesday.  He will need to be off of his Eliquis.  His last dose should be Saturday night.  The patient has been treated for wounds in the past with Dr. Lajoyce Corners.  I have recommended that he get outpatient follow-up for his wounds.   Charlena Cross, MD, FACS [facility] [phone] Pager [phone]

## 2023-09-06 NOTE — Consult Note (Addendum)
 Renal Service Consult Note [facility]  Jordan [name] 09/06/2023 Maree Krabbe, MD Requesting Physician: Dr. Sunnie Nielsen  Reason for Consult: Renal failure HPI: The patient is a 73 y.o. year-old w/ PMH as below who presented 314 2 ED complaining of left leg wounds related to a fall also leg swelling.  History of diabetes and A-fib, also known chronic venous stasis.  In the ED WBC was 14, creatinine 2.4, BP 104/60, heart rate 115.  He was given IV antibiotics for cellulitis/soft tissue skin infection.  The creatinine from 16 months ago was 0.9.  Patient was given IV fluids and follow-up creatinine has not improved stuck in the mid 2s.  We are asked to see for renal failure.   Pt seen in hospital room. States has been on atenolol for 30 yrs and po lasix for 20 years. He lives in assisted living called [facility]. He came here because he fell out of his wheelchair injuring his leg.   Inpatient medications of interest: Eliquis, atenolol, Keflex, Protonix IV cefepime, IV vancomycin given on 3/14 and 3/15 only 1.5 L normal saline boluses on 3/14 + 3/15 PRNs has received Tylenol Maalox melatonin Zofran oxycodone and Ultram  Total I's and O's so far = 8.4 L in and 4.6 L urine output Blood pressures on presentation were in the 90s over 60s Hospital day 2 also in the 90s to 100s Hospital day 3 blood pressures were 110/70 a little bit better Hospital day 4 and 5 and today blood pressures 100-120/60-80 Patient has been on room air since arrival  Chest x-ray 3/14 showed bilateral atelectasis versus pneumonia, no edema Renal ultrasound on 3/19 showed 8 to 10 cm kidneys without hydronephrosis mild atrophy bilaterally  UA on 3/15 - moderate hemoglobin 6-10 RBCs 6-10 WBCs 0-5 epis no protein   PMH Anemia Atrial fibrillation Arthritis CAD Chronic diastolic heart failure COPD Depression Hyperlipidemia Hypertension Status post TAVR Sleep apnea Obesity Venous  insufficiency   ROS - denies CP, no joint pain, no HA, no blurry vision, no rash, no diarrhea, no nausea/ vomiting   Past Medical History  Past Medical History:  Diagnosis Date   Anemia    Anxiety    Arthritis    knee and neck   Atrial fibrillation (HCC)    CAD (coronary artery disease)    cath 9/21: LAD calcified, pLCx 50, mRCA 30, dRCA 30   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 Excell Seltzer and Dr. Laneta Simmers   Severe aortic stenosis    Severe aortic insufficiency, moderate to severe aortic stenosis   Sleep apnea    no longer uses cpap   Venous insufficiency    managed by ortho (Dr. Lajoyce Corners)   Past Surgical History  Past Surgical History:  Procedure Laterality Date   ANKLE SURGERY Right    Dr. Carola Frost, has pins   CARDIOVERSION N/A 07/16/2021   Procedure: CARDIOVERSION;  Surgeon: Thomasene Ripple, DO;  Location: [facility] ENDOSCOPY;  Service: Cardiovascular;  Laterality: N/A;   COLONOSCOPY W/ POLYPECTOMY  KNEE ARTHROSCOPY Right    Dr. Thurston Hole   MULTIPLE EXTRACTIONS WITH ALVEOLOPLASTY Bilateral 05/22/2020   Procedure: MULTIPLE EXTRACTION WITH ALVEOLOPLASTY;  Surgeon: Ocie Doyne, DMD;  Location: [facility] OR;  Service: Oral Surgery;  Laterality: Bilateral;   right foot surgery,rt great toe callus removal     RIGHT/LEFT HEART CATH AND CORONARY ANGIOGRAPHY N/A 03/06/2020   Procedure: RIGHT/LEFT HEART CATH AND CORONARY ANGIOGRAPHY;  Surgeon: Tonny Bollman, MD;  Location[facility] INVASIVE CV LAB;  Service: Cardiovascular;  Laterality: N/A;   TEE WITHOUT CARDIOVERSION N/A 03/09/2020   Procedure: TRANSESOPHAGEAL ECHOCARDIOGRAM (TEE);  Surgeon:  Lars Masson, MD;  Location: [facility] ENDOSCOPY;  Service: Cardiovascular;  Laterality: N/A;   TEE WITHOUT CARDIOVERSION N/A 06/30/2020   Procedure: TRANSESOPHAGEAL ECHOCARDIOGRAM (TEE);  Surgeon: Tonny Bollman, MD;  Location: [facility] OR;  Service: Open Heart Surgery;  Laterality: N/A;   TONSILLECTOMY AND ADENOIDECTOMY     age 77   TOTAL KNEE ARTHROPLASTY Right 02/07/2022   Procedure: TOTAL KNEE ARTHROPLASTY;  Surgeon: Ollen Gross, MD;  Location: [facility] ORS;  Service: Orthopedics;  Laterality: Right;   TRANSCATHETER AORTIC VALVE REPLACEMENT, TRANSFEMORAL N/A 06/30/2020   Procedure: TRANSCATHETER AORTIC VALVE REPLACEMENT, TRANSFEMORAL;  Surgeon: Tonny Bollman, MD;  Location: [facility] OR;  Service: Open Heart Surgery;  Laterality: N/A;   UPPER GI ENDOSCOPY     Family History  Family History  Problem Relation Age of Onset   Hypertension Mother    Alzheimer's disease Mother    Stroke Father        x 3   Heart attack Father    Congestive Heart Failure Father    Diabetes Other        mat great aunts   Colon cancer Neg Hx    Rectal cancer Neg Hx    Esophageal cancer Neg Hx    Social History  reports that he has been smoking cigarettes. He has a 13.8 pack-year smoking history. He has never used smokeless tobacco. He reports that he does not drink alcohol and does not use drugs. Allergies  Allergies  Allergen Reactions   Altace [Ramipril] Other (See Comments)    Dizziness, migraine, weakness- "there is dye in it"   Codeine Itching   Metoprolol Tartrate Other (See Comments)    Dropped the B/P too low- eventually stopped by a MD   Naproxen Itching   Home medications Prior to Admission medications   Medication Sig Start Date End Date Taking? Authorizing Provider  ACETAMINOPHEN-BUTALBITAL 50-325 MG TABS Take 1 tablet by mouth 2 (two) times daily as needed (migraines).   Yes [provider]  atenolol (TENORMIN) 50 MG tablet Take 50 mg by mouth daily.   Yes [provider]   atorvastatin (LIPITOR) 80 MG tablet Take 80 mg by mouth every other day.   Yes [provider]  budesonide-formoterol (SYMBICORT) 160-4.5 MCG/ACT inhaler Inhale 2 puffs into the lungs in the morning and at bedtime. Patient taking differently: Inhale 2 puffs into the lungs 2 (two) times daily as needed (for shortness of breath or wheezing). 03/12/20  Yes Glade Lloyd, MD  doxycycline (VIBRA-TABS) 100 MG tablet Take 100 mg by mouth 2 (two) times daily. 07/11/23  Yes [provider]  EFFEXOR XR 75 MG 24 hr capsule Take 75 mg by mouth daily with breakfast.   Yes [provider]  ELIQUIS 5 MG TABS tablet TAKE 1 TABLET BY MOUTH TWICE A DAY 06/22/22  Yes Weaver, [name] T, PA-C  ezetimibe (ZETIA) 10 MG tablet Take 10 mg  by mouth once a week.   Yes [provider]  furosemide (LASIX) 40 MG tablet TAKE 1 TABLET BY MOUTH EVERY OTHER DAY ALTERNATING WITH 1/2 TABLET EVERY OTHER DAY Patient taking differently: Take 20-40 mg by mouth See admin instructions. Take 1 tablet by mouth every other day alternating with 1/2 tablet every other day 04/04/22  Yes Jake Bathe, MD  LORazepam (ATIVAN) 1 MG tablet Take 1 mg by mouth 2 (two) times daily.   Yes [provider]  melatonin 5 MG TABS Take 5 mg by mouth.   Yes [provider]  miconazole (MICOTIN) 2 % cream Apply 1 Application topically 2 (two) times daily.   Yes [provider]  montelukast (SINGULAIR) 10 MG tablet Take 10 mg by mouth as needed (for allergies). 02/07/20  Yes [provider]  Multiple Vitamin (MULTIVITAMIN WITH MINERALS) TABS Take 1 tablet by mouth daily with breakfast.   Yes [provider]  pantoprazole (PROTONIX) 40 MG tablet Take 40 mg by mouth daily.   Yes [provider]  pantoprazole (PROTONIX) 40 MG tablet Take 40 mg by mouth daily.   Yes [provider]  traMADol (ULTRAM) 50 MG tablet Take 50 mg by mouth 2 (two) times daily as needed.   Yes  [provider]  Vitamin D, Ergocalciferol, 50000 units CAPS Take 50,000 Units by mouth daily.   Yes [provider]  albuterol (VENTOLIN HFA) 108 (90 Base) MCG/ACT inhaler Inhale 1-2 puffs into the lungs every 6 (six) hours as needed for wheezing or shortness of breath. Patient not taking: Reported on 09/01/2023    [provider]     Vitals:   09/06/23 1009 09/06/23 1044 09/06/23 1132 09/06/23 1333  BP: 113/64   113/84  Pulse: 94   (!) 107  Resp:  18  18  Temp:  98.3 F (36.8 C)  98.5 F (36.9 C)  TempSrc:  Oral  Oral  SpO2:   92%   Weight:      Height:       Exam Gen alert, no distress, elderly male  No rash, cyanosis or gangrene Sclera anicteric, throat clear  No jvd or bruits Chest clear bilat to bases, no rales/ wheezing RRR no MRG Abd soft ntnd no mass or ascites +bs GU nl male MS no joint effusions or deformity Ext 2+ bilat hip/ buttock edema, 1+ pretib edema Neuro is alert, Ox 3 , nf     Renal-related home meds: Lasix 40 mg donating with 20 mg every other day Atenolol 25 mg daily Others: Protonix, Ultram, Singulair, Ativan, Eliquis, Effexor XR, Symbicort, statin, Zetia  Chest x-ray 3/14 showed bilateral atelectasis versus pneumonia, no edema Renal ultrasound on 3/19 showed 8 to 10 cm kidneys without hydronephrosis mild atrophy bilaterally  UA on 3/15 - moderate hemoglobin 6-10 RBCs 6-10 WBCs 0-5 epis no protein  Assessment/ Plan: AKI: b/l creat is 0.9- 1.1, last creat in epic was from oct 2023. Nothing in CE. Assuming this is AKI and not CKD (which he could have). UA shows some rbc's/ wbc's, no proteinuria. Renal US w/o obstruction. BP's were soft in 90s on presentation and have improved after IV abx and IVF's. Pt is now vol overloaded w/ sig hip edema and LE edema. I/O are 4 L +. Suspect either this is CKD and/or ATN +/- vol overload. Will start IV lasix 40mg  bid and watch creatinine, get vol down, dc atenolol. Will follow.  Soft  tissue infection: SP IV  abx, is now on po keflex COPD Atrial fib S/p TAVR Chronic resp failure - on home O2 2 L HTN - bp's low, doesn't need atenolol now, will dc.       Vinson Moselle  MD [facility] 09/06/2023, 3:54 PM  Recent Labs  Lab 09/02/23 0525 09/03/23 0440 09/04/23 0407 09/05/23 0444 09/06/23 0458  HGB 7.3*   < > 7.4*  --  7.5*  ALBUMIN 2.0*  --   --   --  2.0*  CALCIUM 7.7*   < > 7.7* 7.9* 7.7*  CREATININE 2.53*   < > 2.05* 2.50* 2.69*  K 3.6   < > 3.5 4.1 4.5   < > = values in this interval not displayed.   Inpatient medications:  apixaban  5 mg Oral BID   atenolol  50 mg Oral Daily   atorvastatin  80 mg Oral QODAY   cephALEXin  500 mg Oral Q8H   ipratropium-albuterol  3 mL Nebulization Q6H   LORazepam  1 mg Oral BID   mometasone-formoterol  2 puff Inhalation BID   multivitamin with minerals  1 tablet Oral Q breakfast   pantoprazole  40 mg Oral Daily   venlafaxine XR  75 mg Oral Q breakfast    acetaminophen **OR** acetaminophen, alum & mag hydroxide-simeth, melatonin, ondansetron (ZOFRAN) IV, oxyCODONE, polyethylene glycol, traMADol

## 2023-09-06 NOTE — Consult Note (Signed)
 [facility] [place]  Patient name: Jordan [name] MRN: [id number] DOB: November 25, 1950 Sex: male   REQUESTING PROVIDER:    Hospital service   REASON FOR CONSULT:    Leg wounds  HISTORY OF PRESENT ILLNESS:   Jordan [name] is a 73 y.o. male, who was admitted to the hospital on 09/01/2023 for worsening leg pain after a injury.  Patient resides at a skilled nursing facility.  He has chronic venous stasis disease bilaterally.  He suffered a skin tear to his left leg during transfer from his wheelchair to the bed.  He failed outpatient antibiotics and was admitted with worsening redness andPain.  He did have a white count of 14,000 on admission and elevated creatinine pain to 2.4.  He was admitted for IVAntibiotics and wound care.  Patient suffers from chronic heart failure.  He has atrial fibrillation on anticoagulation.  He has a history of coronary artery disease.  He has undergone TAVR procedure.  He is on 2 L of nasal cannula oxygen.  He is an occasional smoker.  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 Excell Seltzer and Dr. Laneta Simmers   Severe aortic stenosis    Severe aortic insufficiency, moderate to severe aortic stenosis   Sleep apnea    no longer uses cpap   Venous insufficiency    managed by ortho (Dr. 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     Social Drivers of Corporate investment banker Strain: Not on file  Food Insecurity: Patient Declined (09/01/2023)   Hunger Vital Sign    Worried About Running Out of Food in the Last Year: Patient declined    Ran Out of Food in the Last Year: Patient declined  Transportation Needs: Patient Declined (09/01/2023)   PRAPARE - Administrator, Civil Service (Medical): Patient declined    Lack of Transportation (Non-Medical): Patient declined  Physical Activity: Not on file  Stress: Not on file  Social Connections: Patient Declined (09/01/2023)   Social Connection and Isolation Panel [NHANES]    Frequency of Communication with Friends and Family: Patient declined    Frequency of Social Gatherings with Friends and Family: Patient declined    Attends Religious Services: Patient declined    Automotive engineer or Organizations: Patient declined    Attends Banker Meetings: Patient declined    Marital Status: Patient declined  Intimate Partner Violence: Patient Declined (09/01/2023)   Humiliation, Afraid, Rape, and Kick questionnaire    Fear of Current or Ex-Partner: Patient declined    Emotionally Abused: Patient declined    Physically Abused: Patient declined    Sexually Abused: Patient declined    ALLERGIES:    Allergies  Allergen Reactions   Altace [Ramipril] Other (See Comments)    Dizziness, migraine, weakness- "there is dye in it"   Codeine Itching   Metoprolol Tartrate Other (See Comments)    Dropped the B/P too low- eventually stopped by a MD   Naproxen Itching    CURRENT MEDICATIONS:    Current Facility-Administered Medications  Medication Dose Route Frequency Provider Last Rate Last Admin   acetaminophen (TYLENOL) tablet 650 mg  650 mg Oral Q6H PRN Leandro Reasoner Tublu, MD   650 mg at 09/04/23 1416   Or   acetaminophen (TYLENOL) suppository 650 mg  650 mg Rectal Q6H PRN Pieter Partridge, MD       alum & mag hydroxide-simeth (MAALOX/MYLANTA) 200-200-20 MG/5ML suspension 30 mL  30 mL Oral Q4H PRN Leandro Reasoner Tublu, MD   30 mL at 09/03/23 2223   apixaban (ELIQUIS) tablet 5 mg  5 mg Oral BID Leandro Reasoner Tublu, MD   5 mg at 09/06/23 1006   atenolol (TENORMIN) tablet 50 mg  50 mg Oral Daily Jamelle Rushing L, MD   50 mg at 09/06/23 1007   atorvastatin (LIPITOR) tablet 80 mg  80 mg Oral Corinna Capra Tublu, MD   80 mg at 09/05/23 0800   cephALEXin (KEFLEX) capsule 500 mg  500 mg Oral Q8H Jamelle Rushing L, MD   500 mg at 09/06/23 0528   ipratropium-albuterol (DUONEB) 0.5-2.5 (3) MG/3ML nebulizer solution 3 mL  3 mL Nebulization Q6H Regalado, Belkys A, MD   3 mL at 09/06/23 1132   LORazepam (ATIVAN) tablet 1 mg  1 mg Oral BID Leandro Reasoner Tublu, MD   1 mg at 09/06/23 1007   melatonin tablet 5 mg  5 mg  Oral QHS PRN Leandro Reasoner Tublu, MD   5 mg at 09/05/23 2100   mometasone-formoterol (DULERA) 200-5 MCG/ACT inhaler 2 puff  2 puff Inhalation BID Pieter Partridge, MD   2 puff at 09/06/23 0731   multivitamin with minerals tablet 1 tablet  1 tablet Oral Q breakfast Leandro Reasoner Tublu,  MD   1 tablet at 09/06/23 1007   ondansetron (ZOFRAN) injection 4 mg  4 mg Intravenous Q8H PRN Leeroy Bock, MD   4 mg at 09/04/23 1610   oxyCODONE (Oxy IR/ROXICODONE) immediate release tablet 5 mg  5 mg Oral BID PRN Leeroy Bock, MD   5 mg at 09/05/23 2101   pantoprazole (PROTONIX) EC tablet 40 mg  40 mg Oral Daily Leandro Reasoner Tublu, MD   40 mg at 09/06/23 1007   polyethylene glycol (MIRALAX / GLYCOLAX) packet 17 g  17 g Oral Daily PRN Pieter Partridge, MD       traMADol Janean Sark) tablet 50 mg  50 mg Oral BID PRN Pieter Partridge, MD   50 mg at 09/06/23 0151   venlafaxine XR (EFFEXOR-XR) 24 hr capsule 75 mg  75 mg Oral Q breakfast Pieter Partridge, MD   75 mg at 09/06/23 1007    REVIEW OF SYSTEMS:   [X]  denotes positive finding, [ ]  denotes negative finding Cardiac  Comments:  Chest pain or chest pressure:    Shortness of breath upon exertion:    Short of breath when lying flat:    Irregular heart rhythm:        Vascular    Pain in calf, thigh, or hip brought on by ambulation:    Pain in feet at night that wakes you up from your sleep:     Blood clot in your veins:    Leg swelling:         Pulmonary    Oxygen at home:    Productive cough:     Wheezing:         Neurologic    Sudden weakness in arms or legs:     Sudden numbness in arms or legs:     Sudden onset of difficulty speaking or slurred speech:    Temporary loss of vision in one eye:     Problems with dizziness:         Gastrointestinal    Blood in stool:      Vomited blood:         Genitourinary    Burning when urinating:     Blood in urine:        Psychiatric     Major depression:         Hematologic    Bleeding problems:    Problems with blood clotting too easily:        Skin    Rashes or ulcers:        Constitutional    Fever or chills:     PHYSICAL EXAM:   Vitals:   09/06/23 1009 09/06/23 1044 09/06/23 1132 09/06/23 1333  BP: 113/64   113/84  Pulse: 94   (!) 107  Resp:  18  18  Temp:  98.3 F (36.8 C)    TempSrc:  Oral    SpO2:   92%   Weight:      Height:        GENERAL: The patient is a well-nourished male, in no acute distress. The vital signs are documented above. CARDIAC: There is a regular rate and rhythm.  VASCULAR: Palpable pedal pulses PULMONARY: Nonlabored respirations ABDOMEN: Soft and non-tender with normal pitched bowel sounds.  MUSCULOSKELETAL: There are no major deformities or cyanosis. NEUROLOGIC: No focal weakness or paresthesias are detected. SKIN: See photos below PSYCHIATRIC: The patient has a normal affect.     STUDIES:   I  have reviewed the following : Right: Resting right ankle-brachial index is within normal range.   Unable to obtain TBI due to great toe ulceration.  Left:   Unable to obtain ABI due to patient pain tolerance and movement.  Monophasic waveforms are detected in the dorsalis pedis and posterior  tibial arteries.  Unable to obtain TBI, and MRI of her great toe ulceration.   ASSESSMENT and PLAN   Bilateral venous stasis disease: The left leg recently suffered a tear and he has a large wound here.  He underwent ABIs yesterday which were abnormal particularly on the left.  In addition to compression, elevation, and antibiotics, I feel he would benefit from angiography to further evaluate blood flow to his left leg.  Hopefully he will have correctable disease which would expedite wound healing.  He is on Eliquis and so this will need to be discontinued prior to his procedure.  I will work to get this arranged for next week likely on Tuesday.  This can be performed as an outpatient  procedure so if he is discharged, he can come in on Tuesday.  He will need to be off of his Eliquis.  His last dose should be Saturday night.  The patient has been treated for wounds in the past with Dr. Lajoyce Corners.  I have recommended that he get outpatient follow-up for his wounds.   Charlena Cross, MD, FACS [facility] [phone] Pager [phone]

## 2023-09-07 ENCOUNTER — Other Ambulatory Visit: Payer: Self-pay

## 2023-09-07 DIAGNOSIS — I739 Peripheral vascular disease, unspecified: Secondary | ICD-10-CM

## 2023-09-07 DIAGNOSIS — L089 Local infection of the skin and subcutaneous tissue, unspecified: Secondary | ICD-10-CM | POA: Diagnosis not present

## 2023-09-07 LAB — BASIC METABOLIC PANEL
Anion gap: 8 (ref 5–15)
BUN: 58 mg/dL — ABNORMAL HIGH (ref 8–23)
CO2: 21 mmol/L — ABNORMAL LOW (ref 22–32)
Calcium: 7.6 mg/dL — ABNORMAL LOW (ref 8.9–10.3)
Chloride: 103 mmol/L (ref 98–111)
Creatinine, Ser: 2.61 mg/dL — ABNORMAL HIGH (ref 0.61–1.24)
GFR, Estimated: 25 mL/min — ABNORMAL LOW (ref >60)
Glucose, Bld: 90 mg/dL (ref 70–99)
Potassium: 4.6 mmol/L (ref 3.5–5.1)
Sodium: 132 mmol/L — ABNORMAL LOW (ref 135–145)

## 2023-09-07 LAB — CBC
HCT: 29.2 % — ABNORMAL LOW (ref 39.0–52.0)
Hemoglobin: 8.1 g/dL — ABNORMAL LOW (ref 13.0–17.0)
MCH: 21.4 pg — ABNORMAL LOW (ref 26.0–34.0)
MCHC: 27.7 g/dL — ABNORMAL LOW (ref 30.0–36.0)
MCV: 77.2 fL — ABNORMAL LOW (ref 80.0–100.0)
Platelets: 265 10*3/uL (ref 150–400)
RBC: 3.78 MIL/uL — ABNORMAL LOW (ref 4.22–5.81)
RDW: 21.2 % — ABNORMAL HIGH (ref 11.5–15.5)
WBC: 8.8 10*3/uL (ref 4.0–10.5)
nRBC: 0 % (ref 0.0–0.2)

## 2023-09-07 LAB — HEPATITIS PANEL, ACUTE
HCV Ab: NONREACTIVE
Hep A IgM: NONREACTIVE
Hep B C IgM: NONREACTIVE
Hepatitis B Surface Ag: NONREACTIVE

## 2023-09-07 LAB — CREATININE, URINE, RANDOM: Creatinine, Urine: 135 mg/dL

## 2023-09-07 LAB — SODIUM, URINE, RANDOM: Sodium, Ur: 22 mmol/L

## 2023-09-07 MED ORDER — IPRATROPIUM-ALBUTEROL 0.5-2.5 (3) MG/3ML IN SOLN
3.0000 mL | Freq: Two times a day (BID) | RESPIRATORY_TRACT | Status: DC
  Administered 2023-09-08 – 2023-09-13 (×10): 3 mL via RESPIRATORY_TRACT
  Filled 2023-09-07 (×10): qty 3

## 2023-09-07 MED ORDER — ALBUTEROL SULFATE (2.5 MG/3ML) 0.083% IN NEBU
2.5000 mg | INHALATION_SOLUTION | Freq: Four times a day (QID) | RESPIRATORY_TRACT | Status: DC | PRN
  Filled 2023-09-07: qty 3

## 2023-09-07 MED ORDER — FUROSEMIDE 10 MG/ML IJ SOLN
80.0000 mg | Freq: Three times a day (TID) | INTRAMUSCULAR | Status: DC
  Administered 2023-09-07 – 2023-09-08 (×3): 80 mg via INTRAVENOUS
  Filled 2023-09-07 (×3): qty 8

## 2023-09-07 NOTE — Progress Notes (Signed)
 [facility] Progress Note  Subjective:  Called Dr. Rinaldo Cloud office - last creat in Jan 2024 was 1.3 Pt w/o c/o's today UOP yest 900 yest   Vitals:   09/07/23 0852 09/07/23 1354 09/07/23 1442 09/07/23 1557  BP:  (!) 100/58 104/65   Pulse: 92 (!) 101 99   Resp: 18 18 16    Temp:  98.6 F (37 C) 97.9 F (36.6 C)   TempSrc:  Oral Oral   SpO2: 97% (!) 75% (!) 89% 100%  Weight:      Height:        Exam: Gen alert, no distress, elderly male  No rash, cyanosis or gangrene Sclera anicteric, throat clear  No jvd or bruits Chest clear bilat to bases, no rales/ wheezing RRR no MRG Abd soft ntnd no mass or ascites +bs GU nl male MS no joint effusions or deformity Ext 2+ bilat hip/ buttock edema, 1+ pretib edema Neuro is alert, Ox 3 , nf      Renal-related home meds: Lasix 40 mg donating with 20 mg every other day Atenolol 25 mg daily Others: Protonix, Ultram, Singulair, Ativan, Eliquis, Effexor XR, Symbicort, statin, Zetia   Chest x-ray 3/14 showed bilateral atelectasis versus pneumonia, no edema Renal ultrasound on 3/19 showed 8 to 10 cm kidneys without hydronephrosis mild atrophy bilaterally   UA on 3/15 - moderate hemoglobin 6-10 RBCs 6-10 WBCs 0-5 epis no protein  Last echo jan 2023 - LVEF 55-60%   Assessment/ Plan: AKI: b/l creat is 1.3, from PCP's office in Jan 2024. Assuming this is AKI but could also have CKD. UA shows some rbc's/ wbc's, no proteinuria. Renal US w/o obstruction. BP's were soft in 90s on presentation and then improved after IV abx and IVF's. Pt is now vol overloaded w/ sig hip edema and LE edema. I/O are 4 L +. Suspect this is CKD and/or ATN +/- vol overload. Started IV lasix 40mg  bid yesterday w/ marginal response, will ^lasix to IV 80 tid. Will send off some serologies. Will follow.  Soft tissue infection: SP IV abx, is now on po keflex COPD Atrial fib S/p TAVR Chronic resp failure - on home O2 2 L HTN - bp's low, doesn't need atenolol  now, will dc.     Vinson Moselle MD  [facility] 09/07/2023, 4:02 PM  Recent Labs  Lab 09/02/23 0525 09/03/23 0440 09/06/23 0458 09/07/23 0454  HGB 7.3*   < > 7.5* 8.1*  ALBUMIN 2.0*  --  2.0*  --   CALCIUM 7.7*   < > 7.7* 7.6*  CREATININE 2.53*   < > 2.69* 2.61*  K 3.6   < > 4.5 4.6   < > = values in this interval not displayed.   Recent Labs  Lab 09/03/23 0440  IRON 22*  TIBC 203*  FERRITIN 57   Inpatient medications:  apixaban  5 mg Oral BID   atorvastatin  80 mg Oral QODAY   cephALEXin  500 mg Oral Q8H   furosemide  80 mg Intravenous Q8H   ipratropium-albuterol  3 mL Nebulization Q6H   LORazepam  1 mg Oral BID   mometasone-formoterol  2 puff Inhalation BID   multivitamin with minerals  1 tablet Oral Q breakfast   pantoprazole  40 mg Oral Daily   venlafaxine XR  75 mg Oral Q breakfast    acetaminophen **OR** acetaminophen, alum & mag hydroxide-simeth, melatonin, ondansetron (ZOFRAN) IV, oxyCODONE, polyethylene glycol, traMADol

## 2023-09-07 NOTE — Progress Notes (Signed)
 PROGRESS NOTE    KHING BELCHER  WNU:[id number] DOB: [birth year] DOA: 09/01/2023 PCP: Kennith Center, NP   Brief Narrative: 73 year old with past medical history significant for hypertension, heart failure preserved ejection fraction, A-fib, CAD, COPD with chronic hypoxic respiratory failure on 2 L of oxygen, chronic venous stasis, PVD admitted 3/17 for worsening cellulitis failing outpatient antibiotics, after injury at nursing home previous week.     Assessment & Plan:   Principal Problem:   Soft tissue infection   1-Soft tissue infection, LE infection, ulcers.  Chronic venous stasis disease PVD -ABI: Right: Resting right ankle-brachial index is within normal range. Unable to obtain TBI due to great toe ulceration.  Left:  Unable to obtain ABI due to patient pain tolerance and movement.  Monophasic waveforms are detected in the dorsalis pedis and posterior  tibial arteries.  Unable to obtain TBI due to great toe ulceration.  -Continue local care.  -Antibiotics were change to Keflex.  -Vascular consulted. Plan for arteriogram on Tuesday, needs to hold Eliquis Saturday,. It can be done out patient.   AKI -Previous cr 0.9 (2023) -Creatinine increasing today at 2.6 -Renal ultrasound obtained negative for hydronephrosis -Strict I's and O's -Nephrology consulted patient will be started on IV Lasix -Chest x-ray with some pulmonary edema Cr stable at 2.6 continue with OV lasix.   Hypokalemia: Resolved  Hypertension: Holding  atenolol to avoid hypotension  A-fib: Continue Eliquis  Macrocytic anemia: Continue to monitor hemoglobin, transfuse for hemoglobin less than 7   COPD Chronic hypoxic respiratory failure on 2 L of oxygen 3/19: Wheezing, started scheduled albuterol  Anxiety depression: Continue Effexor and Ativan   Pressure Injury 09/02/23 Heel Left Unstageable - Full thickness tissue loss in which the base of the injury is covered by slough (yellow, tan, gray,  green or brown) and/or eschar (tan, brown or black) in the wound bed. (Active)  09/02/23 1713  Location: Heel  Location Orientation: Left  Staging: Unstageable - Full thickness tissue loss in which the base of the injury is covered by slough (yellow, tan, gray, green or brown) and/or eschar (tan, brown or black) in the wound bed.  Wound Description (Comments):   Present on Admission: Yes  Dressing Type Foam - Lift dressing to assess site every shift 09/07/23 0913                  Estimated body mass index is 30.13 kg/m as calculated from the following:   Height as of this encounter: 6\' 1"  (1.854 m).   Weight as of this encounter: 103.6 kg.   DVT prophylaxis: Eliquis Code Status: Full code Family Communication: Discussed with patient Disposition Plan:  Status is: Inpatient Remains inpatient appropriate because: Management of AKI and lower extremity wounds    Consultants:  Nephrology Vascular  Procedures:  Renal US  Antimicrobials:    Subjective: Doing well,. No new complaints.   Objective: Vitals:   09/07/23 0541 09/07/23 0850 09/07/23 0852 09/07/23 1354  BP: 107/71   (!) 100/58  Pulse: 93 92 92 (!) 101  Resp: 18 20 18 18   Temp: 97.7 F (36.5 C)   98.6 F (37 C)  TempSrc: Oral   Oral  SpO2: 92% 98% 97% (!) 75%  Weight:      Height:        Intake/Output Summary (Last 24 hours) at 09/07/2023 1431 Last data filed at 09/07/2023 1000 Gross per 24 hour  Intake 960 ml  Output 1500 ml  Net -540 ml  Filed Weights   09/01/23 1702 09/06/23 1942 09/07/23 0500  Weight: 80.7 kg 102.3 kg 103.6 kg    Examination:  General exam: NAD Respiratory system: Normal resp effort no wheezing.  Cardiovascular system: S 1, S 2 RRR Gastrointestinal system: BS present, soft, nt Central nervous system: non focal.  Extremities: BL LE ulceration, toe with ulceration   Data Reviewed: I have personally reviewed following labs and imaging studies  CBC: Recent Labs   Lab 09/01/23 1225 09/02/23 0525 09/03/23 0440 09/04/23 0407 09/06/23 0458 09/07/23 0454  WBC 14.2* 12.1* 11.2* 11.6* 10.1 8.8  NEUTROABS 11.4*  --   --  9.1*  --   --   HGB 7.8* 7.3* 7.4* 7.4* 7.5* 8.1*  HCT 27.9* 26.6* 26.5* 27.1* 27.3* 29.2*  MCV 77.3* 78.5* 77.0* 78.1* 76.7* 77.2*  PLT 265 296 311 241 252 265   Basic Metabolic Panel: Recent Labs  Lab 09/03/23 0440 09/04/23 0407 09/05/23 0444 09/06/23 0458 09/07/23 0454  NA 137 138 135 132* 132*  K 3.5 3.5 4.1 4.5 4.6  CL 107 109 105 104 103  CO2 21* 22 22 21* 21*  GLUCOSE 90 114* 96 89 90  BUN 41* 37* 43* 39* 58*  CREATININE 2.11* 2.05* 2.50* 2.69* 2.61*  CALCIUM 7.7* 7.7* 7.9* 7.7* 7.6*   GFR: Estimated Creatinine Clearance: 31.9 mL/min (A) (by C-G formula based on SCr of 2.61 mg/dL (H)). Liver Function Tests: Recent Labs  Lab 09/01/23 1225 09/02/23 0525 09/06/23 0458  AST 29 27 24   ALT 20 18 16   ALKPHOS 145* 128* 128*  BILITOT 1.3* 0.9 0.6  PROT 5.8* 5.4* 6.0*  ALBUMIN 2.3* 2.0* 2.0*   No results for input(s): "LIPASE", "AMYLASE" in the last 168 hours. No results for input(s): "AMMONIA" in the last 168 hours. Coagulation Profile: No results for input(s): "INR", "PROTIME" in the last 168 hours. Cardiac Enzymes: No results for input(s): "CKTOTAL", "CKMB", "CKMBINDEX", "TROPONINI" in the last 168 hours. BNP (last 3 results) No results for input(s): "PROBNP" in the last 8760 hours. HbA1C: No results for input(s): "HGBA1C" in the last 72 hours. CBG: No results for input(s): "GLUCAP" in the last 168 hours. Lipid Profile: No results for input(s): "CHOL", "HDL", "LDLCALC", "TRIG", "CHOLHDL", "LDLDIRECT" in the last 72 hours. Thyroid Function Tests: No results for input(s): "TSH", "T4TOTAL", "FREET4", "T3FREE", "THYROIDAB" in the last 72 hours. Anemia Panel: No results for input(s): "VITAMINB12", "FOLATE", "FERRITIN", "TIBC", "IRON", "RETICCTPCT" in the last 72 hours. Sepsis Labs: Recent Labs  Lab  09/01/23 1228 09/01/23 1415  LATICACIDVEN 1.4 1.2    Recent Results (from the past 240 hours)  Culture, blood (routine x 2)     Status: None   Collection Time: 09/01/23 12:25 PM   Specimen: BLOOD LEFT ARM  Result Value Ref Range Status   Specimen Description   Final    BLOOD LEFT ARM Performed at [facility], [address] [place], [place] [address]    Special Requests   Final    BOTTLES DRAWN AEROBIC AND ANAEROBIC Blood Culture adequate volume Performed at [facility], [address], [place], [place] [address]    Culture   Final    NO GROWTH 5 DAYS Performed at [facility], [address] [place], [place] [address]    Report Status 09/06/2023 FINAL  Final  Culture, blood (routine x 2)     Status: None   Collection Time: 09/01/23  1:18 PM   Specimen: BLOOD RIGHT FOREARM  Result Value Ref Range Status   Specimen Description   Final    BLOOD RIGHT FOREARM Performed at [facility], [address] [place], [place] [address]    Special Requests   Final    BOTTLES DRAWN AEROBIC AND ANAEROBIC Blood Culture adequate volume Performed at [facility], [address], [place], [place] [address]    Culture   Final    NO GROWTH 5 DAYS Performed at [facility], [address], [place], [place] [address]    Report Status 09/06/2023 FINAL  Final  Resp panel by RT-PCR (RSV, Flu A&B, Covid) Anterior Nasal Swab     Status: None   Collection Time: 09/01/23  1:18 PM   Specimen: Anterior Nasal Swab  Result Value Ref Range Status   SARS Coronavirus 2 by RT PCR NEGATIVE NEGATIVE Final    Comment: (NOTE) SARS-CoV-2 target nucleic acids are NOT DETECTED.  The SARS-CoV-2 RNA is generally detectable in upper respiratory specimens during the acute phase of infection. The lowest concentration of SARS-CoV-2 viral copies this assay can detect is 138 copies/mL. A negative result does not preclude SARS-Cov-2 infection and  should not be used as the sole basis for treatment or other patient management decisions. A negative result may occur with  improper specimen collection/handling, submission of specimen other than nasopharyngeal swab, presence of viral mutation(s) within the areas targeted by this assay, and inadequate number of viral copies(<138 copies/mL). A negative result must be combined with clinical observations, patient history, and epidemiological information. The expected result is Negative.  Fact Sheet for Patients:  BloggerCourse.com  Fact Sheet for Healthcare Providers:  SeriousBroker.it  This test is no t yet approved or cleared by the [place] FDA and  has been authorized for detection and/or diagnosis of SARS-CoV-2 by FDA under an Emergency Use Authorization (EUA). This EUA will remain  in effect (meaning this test can be used) for the duration of the COVID-19 declaration under Section 564(b)(1) of the Act, 21 U.S.C.section 360bbb-3(b)(1), unless the authorization is terminated  or revoked sooner.       Influenza A by PCR NEGATIVE NEGATIVE Final   Influenza B by PCR NEGATIVE NEGATIVE Final    Comment: (NOTE) The Xpert Xpress SARS-CoV-2/FLU/RSV plus assay is intended as an aid in the diagnosis of influenza from Nasopharyngeal swab specimens and should not be used as a sole basis for treatment. Nasal washings and aspirates are unacceptable for Xpert Xpress SARS-CoV-2/FLU/RSV testing.  Fact Sheet for Patients: BloggerCourse.com  Fact Sheet for Healthcare Providers: SeriousBroker.it  This test is not yet approved or cleared by the [place] FDA and has been authorized for detection and/or diagnosis of SARS-CoV-2 by FDA under an Emergency Use Authorization (EUA). This EUA will remain in effect (meaning this test can be used) for the duration of the COVID-19 declaration  under Section 564(b)(1) of the Act, 21 U.S.C. section 360bbb-3(b)(1), unless the authorization is terminated or revoked.     Resp Syncytial Virus by PCR NEGATIVE NEGATIVE Final    Comment: (NOTE) Fact Sheet for Patients: BloggerCourse.com  Fact Sheet for Healthcare Providers: SeriousBroker.it  This test is not yet approved or cleared by the [place] FDA and has been authorized for detection and/or diagnosis of SARS-CoV-2 by FDA under an Emergency Use Authorization (EUA). This EUA will remain in effect (meaning this test can be used) for the duration of the COVID-19 declaration under Section 564(b)(1) of the Act, 21 U.S.C. section 360bbb-3(b)(1), unless  the authorization is terminated or revoked.  Performed at [facility], [address] [place], [place] [address]   MRSA Next Gen by PCR, Nasal     Status: None   Collection Time: 09/02/23 11:45 AM   Specimen: Nasal Mucosa; Nasal Swab  Result Value Ref Range Status   MRSA by PCR Next Gen NOT DETECTED NOT DETECTED Final    Comment: (NOTE) The GeneXpert MRSA Assay (FDA approved for NASAL specimens only), is one component of a comprehensive MRSA colonization surveillance program. It is not intended to diagnose MRSA infection nor to guide or monitor treatment for MRSA infections. Test performance is not FDA approved in patients less than 66 years old. Performed at [facility], [address], [place], [place] [address]          Radiology Studies: DG CHEST PORT 1 VIEW Result Date: 09/06/2023 CLINICAL DATA:  Wheezing. EXAM: PORTABLE CHEST 1 VIEW COMPARISON:  September 01, 2023. FINDINGS: Stable cardiomediastinal silhouette. Mild central pulmonary vascular congestion is noted with probable bilateral pulmonary edema and bibasilar atelectasis and effusions. Bony thorax is unremarkable. IMPRESSION: Mild central pulmonary vascular congestion  with probable bilateral pulmonary edema and bibasilar atelectasis and pleural effusions. Electronically Signed   By: Lupita Raider M.D.   On: 09/06/2023 13:36   US RENAL Result Date: 09/06/2023 CLINICAL DATA:  Acute renal insufficiency. EXAM: RENAL / URINARY TRACT ULTRASOUND COMPLETE COMPARISON:  None Available. FINDINGS: Evaluation is limited due to overlying bowel gas and body habitus. Right Kidney: Renal measurements: 8.5 x 4.8 x 4.9 cm = volume: 104 ML. Mildly atrophic. Normal echogenicity. No hydronephrosis or shadowing stone. Left Kidney: Renal measurements: 9.5 x 5.5 x 4.6 cm = volume: 125 mL. Mildly atrophic. Normal echogenicity. No hydronephrosis or shadowing stone. Bladder: The urinary bladder is suboptimally visualized but grossly unremarkable. Other: None. IMPRESSION: No hydronephrosis or shadowing stone. Electronically Signed   By: Elgie Collard M.D.   On: 09/06/2023 13:12        Scheduled Meds:  apixaban  5 mg Oral BID   atorvastatin  80 mg Oral QODAY   cephALEXin  500 mg Oral Q8H   furosemide  80 mg Intravenous Q8H   ipratropium-albuterol  3 mL Nebulization Q6H   LORazepam  1 mg Oral BID   mometasone-formoterol  2 puff Inhalation BID   multivitamin with minerals  1 tablet Oral Q breakfast   pantoprazole  40 mg Oral Daily   venlafaxine XR  75 mg Oral Q breakfast   Continuous Infusions:   LOS: 6 days    Time spent: 35 minutes    Cj Edgell A Jomo Forand, MD [facility]   If 7PM-7AM, please contact night-coverage [email or web address]  09/07/2023, 2:31 PM

## 2023-09-07 NOTE — TOC Progression Note (Signed)
 Transition of Care [facility]) - Progression Note    Patient Details  Name: MESHACH PERRY MRN: [id number] Date of Birth: Sep 01, 1950  [facility] CM/SW Contact  Adrian Prows, RN Phone Number: 09/07/2023, 4:13 PM  Clinical Narrative:    Sherron Monday w/ Darrian, Admissions at [facility]; she confirms bed offer; offer accepted via SNF hub; pt not ready for d/c; ins auth needed.    Expected Discharge Plan: Assisted Living Barriers to Discharge: Continued Medical Work up  Expected Discharge Plan and Services In-house Referral: Clinical Social Work     Living arrangements for the past 2 months: Assisted Living Facility                                       Social Determinants of Health (SDOH) Interventions SDOH Screenings   Food Insecurity: Patient Declined (09/01/2023)  Housing: Patient Declined (09/01/2023)  Transportation Needs: Patient Declined (09/01/2023)  Utilities: Patient Declined (09/01/2023)  Social Connections: Patient Declined (09/01/2023)  Tobacco Use: High Risk (09/01/2023)    Readmission Risk Interventions    09/04/2023    2:49 PM 04/04/2022   11:02 AM  Readmission Risk Prevention Plan  Transportation Screening Complete Complete  PCP or Specialist Appt within 5-7 Days Complete   PCP or Specialist Appt within 3-5 Days  Complete  Home Care Screening Complete   Medication Review (RN CM) Complete   HRI or Home Care Consult  Complete  Social Work Consult for Recovery Care Planning/Counseling  Complete  Palliative Care Screening  Not Applicable  Medication Review Oceanographer)  Complete

## 2023-09-08 ENCOUNTER — Inpatient Hospital Stay (HOSPITAL_COMMUNITY)

## 2023-09-08 DIAGNOSIS — L089 Local infection of the skin and subcutaneous tissue, unspecified: Secondary | ICD-10-CM | POA: Diagnosis not present

## 2023-09-08 DIAGNOSIS — R9431 Abnormal electrocardiogram [ECG] [EKG]: Secondary | ICD-10-CM

## 2023-09-08 LAB — CBC
HCT: 27.2 % — ABNORMAL LOW (ref 39.0–52.0)
HCT: 27.3 % — ABNORMAL LOW (ref 39.0–52.0)
Hemoglobin: 7.6 g/dL — ABNORMAL LOW (ref 13.0–17.0)
Hemoglobin: 7.6 g/dL — ABNORMAL LOW (ref 13.0–17.0)
MCH: 21.8 pg — ABNORMAL LOW (ref 26.0–34.0)
MCH: 21.7 pg — ABNORMAL LOW (ref 26.0–34.0)
MCHC: 27.9 g/dL — ABNORMAL LOW (ref 30.0–36.0)
MCHC: 27.8 g/dL — ABNORMAL LOW (ref 30.0–36.0)
MCV: 77.9 fL — ABNORMAL LOW (ref 80.0–100.0)
MCV: 77.8 fL — ABNORMAL LOW (ref 80.0–100.0)
Platelets: 259 10*3/uL (ref 150–400)
Platelets: 274 10*3/uL (ref 150–400)
RBC: 3.49 MIL/uL — ABNORMAL LOW (ref 4.22–5.81)
RBC: 3.51 MIL/uL — ABNORMAL LOW (ref 4.22–5.81)
RDW: 21.4 % — ABNORMAL HIGH (ref 11.5–15.5)
RDW: 22.1 % — ABNORMAL HIGH (ref 11.5–15.5)
WBC: 7.9 10*3/uL (ref 4.0–10.5)
WBC: 9.7 10*3/uL (ref 4.0–10.5)
nRBC: 0 % (ref 0.0–0.2)
nRBC: 0 % (ref 0.0–0.2)

## 2023-09-08 LAB — ECHOCARDIOGRAM COMPLETE
AR max vel: 2.46 cm2
AV Area VTI: 2.12 cm2
AV Area mean vel: 2.18 cm2
AV Mean grad: 2 mmHg
AV Peak grad: 3.9 mmHg
Ao pk vel: 0.99 m/s
Area-P 1/2: 5.31 cm2
Height: 73 in
MV VTI: 1.38 cm2
S' Lateral: 4.1 cm
Single Plane A4C EF: 64.3 %
Weight: 3654.34 [oz_av]

## 2023-09-08 LAB — BASIC METABOLIC PANEL
Anion gap: 10 (ref 5–15)
BUN: 59 mg/dL — ABNORMAL HIGH (ref 8–23)
CO2: 21 mmol/L — ABNORMAL LOW (ref 22–32)
Calcium: 7.8 mg/dL — ABNORMAL LOW (ref 8.9–10.3)
Chloride: 104 mmol/L (ref 98–111)
Creatinine, Ser: 2.46 mg/dL — ABNORMAL HIGH (ref 0.61–1.24)
GFR, Estimated: 27 mL/min — ABNORMAL LOW (ref >60)
Glucose, Bld: 88 mg/dL (ref 70–99)
Potassium: 4.4 mmol/L (ref 3.5–5.1)
Sodium: 135 mmol/L (ref 135–145)

## 2023-09-08 LAB — ANCA TITERS
Atypical P-ANCA titer: <1:20 {titer}
C-ANCA: <1:20 {titer}
P-ANCA: <1:20 {titer}

## 2023-09-08 LAB — KAPPA/LAMBDA LIGHT CHAINS
Kappa free light chain: 154.5 mg/L — ABNORMAL HIGH (ref 3.3–19.4)
Kappa, lambda light chain ratio: 0.79 (ref 0.26–1.65)
Lambda free light chains: 196.4 mg/L — ABNORMAL HIGH (ref 5.7–26.3)

## 2023-09-08 MED ORDER — METOLAZONE 5 MG PO TABS
5.0000 mg | ORAL_TABLET | Freq: Every day | ORAL | Status: DC
  Administered 2023-09-08 – 2023-09-10 (×3): 5 mg via ORAL
  Filled 2023-09-08 (×4): qty 1

## 2023-09-08 MED ORDER — FUROSEMIDE 10 MG/ML IJ SOLN: 120.0000 mg | Freq: Three times a day (TID) | INTRAMUSCULAR | Status: DC

## 2023-09-08 MED ORDER — HEPARIN (PORCINE) 25000 UT/250ML-% IV SOLN: 1150.0000 [IU]/h | INTRAVENOUS | Status: DC

## 2023-09-08 MED ORDER — FUROSEMIDE 10 MG/ML IJ SOLN
120.0000 mg | Freq: Three times a day (TID) | INTRAVENOUS | Status: DC
  Administered 2023-09-08 – 2023-09-10 (×7): 120 mg via INTRAVENOUS
  Filled 2023-09-08 (×2): qty 10
  Filled 2023-09-08: qty 12
  Filled 2023-09-08: qty 10
  Filled 2023-09-08: qty 2
  Filled 2023-09-08: qty 10
  Filled 2023-09-08: qty 12
  Filled 2023-09-08 (×2): qty 10

## 2023-09-08 MED ORDER — PERFLUTREN LIPID MICROSPHERE
1.0000 mL | INTRAVENOUS | Status: AC | PRN
  Administered 2023-09-08: 3 mL via INTRAVENOUS

## 2023-09-08 MED ORDER — APIXABAN 5 MG PO TABS
5.0000 mg | ORAL_TABLET | Freq: Two times a day (BID) | ORAL | Status: AC
  Administered 2023-09-08: 5 mg via ORAL
  Filled 2023-09-08: qty 1

## 2023-09-08 MED ORDER — FUROSEMIDE 10 MG/ML IJ SOLN
40.0000 mg | Freq: Once | INTRAMUSCULAR | Status: AC
  Administered 2023-09-08: 40 mg via INTRAVENOUS
  Filled 2023-09-08: qty 4

## 2023-09-08 MED ORDER — HEPARIN (PORCINE) 25000 UT/250ML-% IV SOLN
1300.0000 [IU]/h | INTRAVENOUS | Status: DC
  Administered 2023-09-09: 1150 [IU]/h via INTRAVENOUS
  Administered 2023-09-10: 1300 [IU]/h via INTRAVENOUS
  Filled 2023-09-08 (×2): qty 250

## 2023-09-08 MED ORDER — CEPHALEXIN 500 MG PO CAPS
500.0000 mg | ORAL_CAPSULE | Freq: Three times a day (TID) | ORAL | Status: AC
  Administered 2023-09-08 – 2023-09-11 (×9): 500 mg via ORAL
  Filled 2023-09-08 (×9): qty 1

## 2023-09-08 MED ORDER — DOXYCYCLINE HYCLATE 100 MG PO TABS
100.0000 mg | ORAL_TABLET | Freq: Two times a day (BID) | ORAL | Status: AC
  Administered 2023-09-08 – 2023-09-12 (×10): 100 mg via ORAL
  Filled 2023-09-08 (×10): qty 1

## 2023-09-08 NOTE — Progress Notes (Addendum)
 PHARMACY - ANTICOAGULATION CONSULT NOTE  Pharmacy Consult for  Eliquis --> heparin  Indication: hx atrial fibrillation  Allergies  Allergen Reactions   Altace [Ramipril] Other (See Comments)    Dizziness, migraine, weakness- "there is dye in it"   Codeine Itching   Metoprolol Tartrate Other (See Comments)    Dropped the B/P too low- eventually stopped by a MD   Naproxen Itching    Patient Measurements: Height: 6\' 1"  (185.4 cm) Weight: 103.6 kg (228 lb 6.3 oz) IBW/kg (Calculated) : 79.9 Heparin Dosing Weight: 81 kg  Vital Signs: Temp: 98.4 F (36.9 C) (03/21 1415) Temp Source: Oral (03/21 1415) BP: 116/69 (03/21 1415) Pulse Rate: 94 (03/21 1415)  Labs: Recent Labs    09/06/23 0458 09/07/23 0454 09/08/23 0803  HGB 7.5* 8.1* 7.6*  HCT 27.3* 29.2* 27.2*  PLT 252 265 259  CREATININE 2.69* 2.61* 2.46*    Estimated Creatinine Clearance: 33.8 mL/min (A) (by C-G formula based on SCr of 2.46 mg/dL (H)).   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 Excell Seltzer and Dr. Laneta Simmers   Severe aortic stenosis    Severe aortic insufficiency, moderate to severe aortic stenosis   Sleep apnea    no longer uses cpap   Venous insufficiency    managed by ortho (Dr. Lajoyce Corners)    Medications:  - on Eliquis 5 mg bid PTA   Assessment: Patient is a 73 y.o M with hx CKD, PVD, bilateral venous stasis disease and  afib on Eliquis PTA who presented to the ED on 09/01/23 with worsening of left leg wound/pain sustained after an injury at the nursing facility. He was started on abx for wound infection and Eliquis resumed on admission. ABIs performed on 09/05/22, but they were unable to fully do ABI of the left leg due to pt's inability to keep still. Pharmacy has been consulted to change anticoag. to heparin drip on 09/09/23 in anticipation for angiography on 09/12/23 to evaluate blood flow to his left leg.   Today, 09/08/2023: - MD ordered last dose of Eliquis to be given on 09/08/23 at 10p - hgb low but somewhat stable - scr 2.46  Goal of Therapy:  Heparin level 0.3-0.7 units/ml aPTT 66-102 seconds Monitor platelets by anticoagulation protocol: Yes   Plan:  - start heparin drip on 09/09/23 at around 10a or 12 hrs after the last dose of Eliquis was given at 1150 units/hr - check 8 hr heparin level and aPTT after starting heparin drip - monitor for s/sx bleeding   Jordan [name] P 09/08/2023,3:15 PM

## 2023-09-08 NOTE — Progress Notes (Signed)
 Physical Therapy Treatment Patient Details Name: Jordan [name] MRN: [id number] DOB: [birth year] Today's Date: 09/08/2023   History of Present Illness 73 yo male admitted with soft tissue infection/L LE wound, L toe laceration after sustaining a fall at ALF while be transferred back to bed per pt report. Hx of TAVR, bil LE edema, R TKA 07/2021, DM, Afib, anemia, anxiety, CHF, COPD-on O2 PRN per pt, obesity, aortic stenosis, venous insufficiency    PT Comments  Pt hesitant to participate but eventually agreeable to sit EOB.  Pt only able to briefly sit EOB due to reported dyspnea (remained on 2L O2 [place]) but unable to get pulse oximeter reading.  Pt repositioned with pillows end of session.  Pt currently requiring at least mod assist for bed mobility and will likely need increased assist from baseline to perform transfers OOB. Patient will benefit from continued inpatient follow up therapy, <3 hours/day.    If plan is discharge home, recommend the following: A lot of help with walking and/or transfers;A lot of help with bathing/dressing/bathroom;Assistance with cooking/housework;Assist for transportation;Help with stairs or ramp for entrance   Can travel by private vehicle     No  Equipment Recommendations  None recommended by PT    Recommendations for Other Services       Precautions / Restrictions Precautions Precautions: Fall Precaution/Restrictions Comments: multiple LE wounds-tender to touch; chronic 2L baseline     Mobility  Bed Mobility Overal bed mobility: Needs Assistance Bed Mobility: Supine to Sit, Sit to Supine     Supine to sit: HOB elevated, Mod assist, Used rails Sit to supine: HOB elevated, Mod assist, Used rails   General bed mobility comments: assist mostly for LEs but also need assist to control trunk; pt attempting to perform as much as able; pt reports dyspnea sitting EOB, only able to sit for approx 1.5 minutes and then assisted back to bed and repositioned  with pillows; not able to wait long enough for pulse ox reading but pt did remain on baseline 2L O2 [place] throughout session    Transfers                        Ambulation/Gait                   Stairs             Wheelchair Mobility     Tilt Bed    Modified Rankin (Stroke Patients Only)       Balance Overall balance assessment: Needs assistance Sitting-balance support: Bilateral upper extremity supported, Feet supported Sitting balance-Leahy Scale: Poor Sitting balance - Comments: reliant on UE support                                    Communication Communication Communication: No apparent difficulties  Cognition Arousal: Alert Behavior During Therapy: WFL for tasks assessed/performed   PT - Cognitive impairments: No apparent impairments                         Following commands: Intact      Cueing    Exercises      General Comments        Pertinent Vitals/Pain Pain Assessment Pain Assessment: Faces Faces Pain Scale: Hurts whole lot Pain Location: B LEs Pain Descriptors / Indicators: Tender, Grimacing, Guarding, Moaning Pain Intervention(s): Repositioned, Monitored  during session    Home Living                          Prior Function            PT Goals (current goals can now be found in the care plan section) Progress towards PT goals: Progressing toward goals    Frequency    Min 2X/week      PT Plan      Co-evaluation              AM-PAC PT "6 Clicks" Mobility   Outcome Measure  Help needed turning from your back to your side while in a flat bed without using bedrails?: A Lot Help needed moving from lying on your back to sitting on the side of a flat bed without using bedrails?: A Lot Help needed moving to and from a bed to a chair (including a wheelchair)?: Total Help needed standing up from a chair using your arms (e.g., wheelchair or bedside chair)?: Total Help  needed to walk in hospital room?: Total Help needed climbing 3-5 steps with a railing? : Total 6 Click Score: 8    End of Session Equipment Utilized During Treatment: Oxygen Activity Tolerance: Patient limited by fatigue;Patient limited by pain Patient left: in bed;with call bell/phone within reach;with bed alarm set   PT Visit Diagnosis: Muscle weakness (generalized) (M62.81);History of falling (Z91.81)     Time: 1450-1502 PT Time Calculation (min) (ACUTE ONLY): 12 min  Charges:    $Therapeutic Activity: 8-22 mins PT General Charges $$ ACUTE PT VISIT: 1 Visit                     Paulino Door, DPT Physical Therapist Acute Rehabilitation Services Office: [phone]    Janan Halter [place] 09/08/2023, 4:46 PM

## 2023-09-08 NOTE — Progress Notes (Signed)
 PROGRESS NOTE    Jordan [name]  WGN:[id number] DOB: [birth year] DOA: 09/01/2023 PCP: Kennith Center, NP   Brief Narrative: 73 year old with past medical history significant for hypertension, heart failure preserved ejection fraction, A-fib, CAD, COPD with chronic hypoxic respiratory failure on 2 L of oxygen, chronic venous stasis, PVD admitted 3/17 for worsening cellulitis failing outpatient antibiotics, after injury at nursing home previous week.     Assessment & Plan:   Principal Problem:   Soft tissue infection   1-Soft tissue infection, LE infection, ulcers.  Chronic venous stasis disease PVD -ABI: Right: Resting right ankle-brachial index is within normal range. Unable to obtain TBI due to great toe ulceration.  Left:  Unable to obtain ABI due to patient pain tolerance and movement.  Monophasic waveforms are detected in the dorsalis pedis and posterior  tibial arteries.  Unable to obtain TBI due to great toe ulceration.  -Continue local care.  -Antibiotics were change to Keflex. Extend course to complete 10 days, still having significant redness, will add Doxy.  -Vascular consulted. Plan for arteriogram on Tuesday, needs to hold Eliquis Saturday,. It can be done out patient.   AKI, on ? CKD -Previous cr 0.9 (2023) -Creatinine increasing today at 2.6 -Renal ultrasound obtained negative for hydronephrosis -Strict I's and O's -Nephrology consulted, managing diuresis.  -Chest x-ray with pulmonary edema -Cr down to 2.4 Lasix increase to 120 mg today.   Hypokalemia: Resolved  Hypertension: Holding  atenolol to avoid hypotension  A-fib: Plan to hold eliquis tomorrow, for anticipation arteriogram on Tuesday. Will bridge with heparin gtt  Macrocytic anemia: Continue to monitor hemoglobin, transfuse for hemoglobin less than 7   COPD Chronic hypoxic respiratory failure on 2 L of oxygen 3/19: Wheezing, started scheduled albuterol  Anxiety depression: Continue Effexor  and Ativan   Pressure Injury 09/02/23 Heel Left Unstageable - Full thickness tissue loss in which the base of the injury is covered by slough (yellow, tan, gray, green or brown) and/or eschar (tan, brown or black) in the wound bed. (Active)  09/02/23 1713  Location: Heel  Location Orientation: Left  Staging: Unstageable - Full thickness tissue loss in which the base of the injury is covered by slough (yellow, tan, gray, green or brown) and/or eschar (tan, brown or black) in the wound bed.  Wound Description (Comments):   Present on Admission: Yes  Dressing Type Foam - Lift dressing to assess site every shift 09/08/23 1000     Estimated body mass index is 30.13 kg/m as calculated from the following:   Height as of this encounter: 6\' 1"  (1.854 m).   Weight as of this encounter: 103.6 kg.   DVT prophylaxis: Eliquis Code Status: Full code Family Communication: Discussed with patient Disposition Plan:  Status is: Inpatient Remains inpatient appropriate because: Management of AKI and lower extremity wounds    Consultants:  Nephrology Vascular  Procedures:  Renal US  Antimicrobials:    Subjective: Denies worsening dyspnea, he was placed on 2 L oxygen yesterday.   Objective: Vitals:   09/07/23 2154 09/08/23 0536 09/08/23 0836 09/08/23 1415  BP: (!) 111/53 110/68  116/69  Pulse: (!) 108 (!) 109  94  Resp: 18 18  16   Temp: 98.3 F (36.8 C) 98.6 F (37 C)  98.4 F (36.9 C)  TempSrc: Oral Oral  Oral  SpO2: 95% 100% 95%   Weight:      Height:        Intake/Output Summary (Last 24 hours) at 09/08/2023 1447  Last data filed at 09/08/2023 1415 Gross per 24 hour  Intake 1200 ml  Output 2950 ml  Net -1750 ml   Filed Weights   09/01/23 1702 09/06/23 1942 09/07/23 0500  Weight: 80.7 kg 102.3 kg 103.6 kg    Examination:  General exam: NAD Respiratory system: Normal resp effort no wheezing.  Cardiovascular system: S 1, S 2 RRR Gastrointestinal system: BS present,  soft, nt Central nervous system: non focal.  Extremities: BL LE ulceration, toe with ulceration LE with persistent redness.    Data Reviewed: I have personally reviewed following labs and imaging studies  CBC: Recent Labs  Lab 09/03/23 0440 09/04/23 0407 09/06/23 0458 09/07/23 0454 09/08/23 0803  WBC 11.2* 11.6* 10.1 8.8 7.9  NEUTROABS  --  9.1*  --   --   --   HGB 7.4* 7.4* 7.5* 8.1* 7.6*  HCT 26.5* 27.1* 27.3* 29.2* 27.2*  MCV 77.0* 78.1* 76.7* 77.2* 77.9*  PLT 311 241 252 265 259   Basic Metabolic Panel: Recent Labs  Lab 09/04/23 0407 09/05/23 0444 09/06/23 0458 09/07/23 0454 09/08/23 0803  NA 138 135 132* 132* 135  K 3.5 4.1 4.5 4.6 4.4  CL 109 105 104 103 104  CO2 22 22 21* 21* 21*  GLUCOSE 114* 96 89 90 88  BUN 37* 43* 39* 58* 59*  CREATININE 2.05* 2.50* 2.69* 2.61* 2.46*  CALCIUM 7.7* 7.9* 7.7* 7.6* 7.8*   GFR: Estimated Creatinine Clearance: 33.8 mL/min (A) (by C-G formula based on SCr of 2.46 mg/dL (H)). Liver Function Tests: Recent Labs  Lab 09/02/23 0525 09/06/23 0458  AST 27 24  ALT 18 16  ALKPHOS 128* 128*  BILITOT 0.9 0.6  PROT 5.4* 6.0*  ALBUMIN 2.0* 2.0*   No results for input(s): "LIPASE", "AMYLASE" in the last 168 hours. No results for input(s): "AMMONIA" in the last 168 hours. Coagulation Profile: No results for input(s): "INR", "PROTIME" in the last 168 hours. Cardiac Enzymes: No results for input(s): "CKTOTAL", "CKMB", "CKMBINDEX", "TROPONINI" in the last 168 hours. BNP (last 3 results) No results for input(s): "PROBNP" in the last 8760 hours. HbA1C: No results for input(s): "HGBA1C" in the last 72 hours. CBG: No results for input(s): "GLUCAP" in the last 168 hours. Lipid Profile: No results for input(s): "CHOL", "HDL", "LDLCALC", "TRIG", "CHOLHDL", "LDLDIRECT" in the last 72 hours. Thyroid Function Tests: No results for input(s): "TSH", "T4TOTAL", "FREET4", "T3FREE", "THYROIDAB" in the last 72 hours. Anemia Panel: No results  for input(s): "VITAMINB12", "FOLATE", "FERRITIN", "TIBC", "IRON", "RETICCTPCT" in the last 72 hours. Sepsis Labs: No results for input(s): "PROCALCITON", "LATICACIDVEN" in the last 168 hours.   Recent Results (from the past 240 hours)  Culture, blood (routine x 2)     Status: None   Collection Time: 09/01/23 12:25 PM   Specimen: BLOOD LEFT ARM  Result Value Ref Range Status   Specimen Description   Final    BLOOD LEFT ARM Performed at [facility], [address], [place], [place] [address]    Special Requests   Final    BOTTLES DRAWN AEROBIC AND ANAEROBIC Blood Culture adequate volume Performed at [facility], [address] [place], [place] [address]    Culture   Final    NO GROWTH 5 DAYS Performed at [facility], [address], [place], [place] [address]    Report Status 09/06/2023 FINAL  Final  Culture, blood (routine x 2)     Status: None   Collection  Time: 09/01/23  1:18 PM   Specimen: BLOOD RIGHT FOREARM  Result Value Ref Range Status   Specimen Description   Final    BLOOD RIGHT FOREARM Performed at [facility], [address], [place], [place] [address]    Special Requests   Final    BOTTLES DRAWN AEROBIC AND ANAEROBIC Blood Culture adequate volume Performed at [facility], [address] [place], [place] [address]    Culture   Final    NO GROWTH 5 DAYS Performed at [facility], [address] [place], [place] [address]    Report Status 09/06/2023 FINAL  Final  Resp panel by RT-PCR (RSV, Flu A&B, Covid) Anterior Nasal Swab     Status: None   Collection Time: 09/01/23  1:18 PM   Specimen: Anterior Nasal Swab  Result Value Ref Range Status   SARS Coronavirus 2 by RT PCR NEGATIVE NEGATIVE Final    Comment: (NOTE) SARS-CoV-2 target nucleic acids are NOT DETECTED.  The SARS-CoV-2 RNA is generally detectable in upper respiratory specimens during the acute phase of infection. The  lowest concentration of SARS-CoV-2 viral copies this assay can detect is 138 copies/mL. A negative result does not preclude SARS-Cov-2 infection and should not be used as the sole basis for treatment or other patient management decisions. A negative result may occur with  improper specimen collection/handling, submission of specimen other than nasopharyngeal swab, presence of viral mutation(s) within the areas targeted by this assay, and inadequate number of viral copies(<138 copies/mL). A negative result must be combined with clinical observations, patient history, and epidemiological information. The expected result is Negative.  Fact Sheet for Patients:  BloggerCourse.com  Fact Sheet for Healthcare Providers:  SeriousBroker.it  This test is no t yet approved or cleared by the [place] FDA and  has been authorized for detection and/or diagnosis of SARS-CoV-2 by FDA under an Emergency Use Authorization (EUA). This EUA will remain  in effect (meaning this test can be used) for the duration of the COVID-19 declaration under Section 564(b)(1) of the Act, 21 U.S.C.section 360bbb-3(b)(1), unless the authorization is terminated  or revoked sooner.       Influenza A by PCR NEGATIVE NEGATIVE Final   Influenza B by PCR NEGATIVE NEGATIVE Final    Comment: (NOTE) The Xpert Xpress SARS-CoV-2/FLU/RSV plus assay is intended as an aid in the diagnosis of influenza from Nasopharyngeal swab specimens and should not be used as a sole basis for treatment. Nasal washings and aspirates are unacceptable for Xpert Xpress SARS-CoV-2/FLU/RSV testing.  Fact Sheet for Patients: BloggerCourse.com  Fact Sheet for Healthcare Providers: SeriousBroker.it  This test is not yet approved or cleared by the [place] FDA and has been authorized for detection and/or diagnosis of SARS-CoV-2 by FDA under  an Emergency Use Authorization (EUA). This EUA will remain in effect (meaning this test can be used) for the duration of the COVID-19 declaration under Section 564(b)(1) of the Act, 21 U.S.C. section 360bbb-3(b)(1), unless the authorization is terminated or revoked.     Resp Syncytial Virus by PCR NEGATIVE NEGATIVE Final    Comment: (NOTE) Fact Sheet for Patients: BloggerCourse.com  Fact Sheet for Healthcare Providers: SeriousBroker.it  This test is not yet approved or cleared by the [place] FDA and has been authorized for detection and/or diagnosis of SARS-CoV-2 by FDA under an Emergency Use Authorization (EUA). This EUA will remain in effect (meaning this test can be used) for the duration of the COVID-19  declaration under Section 564(b)(1) of the Act, 21 U.S.C. section 360bbb-3(b)(1), unless the authorization is terminated or revoked.  Performed at [facility], [address] [place], [place] [address]   MRSA Next Gen by PCR, Nasal     Status: None   Collection Time: 09/02/23 11:45 AM   Specimen: Nasal Mucosa; Nasal Swab  Result Value Ref Range Status   MRSA by PCR Next Gen NOT DETECTED NOT DETECTED Final    Comment: (NOTE) The GeneXpert MRSA Assay (FDA approved for NASAL specimens only), is one component of a comprehensive MRSA colonization surveillance program. It is not intended to diagnose MRSA infection nor to guide or monitor treatment for MRSA infections. Test performance is not FDA approved in patients less than 80 years old. Performed at [facility], [address] [place], [place] [address]          Radiology Studies: No results found.       Scheduled Meds:  apixaban  5 mg Oral BID   atorvastatin  80 mg Oral QODAY   cephALEXin  500 mg Oral Q8H   doxycycline  100 mg Oral Q12H   ipratropium-albuterol  3 mL Nebulization BID   LORazepam  1 mg Oral BID    metolazone  5 mg Oral Daily   mometasone-formoterol  2 puff Inhalation BID   multivitamin with minerals  1 tablet Oral Q breakfast   pantoprazole  40 mg Oral Daily   venlafaxine XR  75 mg Oral Q breakfast   Continuous Infusions:  furosemide 120 mg (09/08/23 1358)     LOS: 7 days    Time spent: 35 minutes    Shenekia Riess A Jearline Hirschhorn, MD [facility]   If 7PM-7AM, please contact night-coverage [email or web address]  09/08/2023, 2:47 PM

## 2023-09-08 NOTE — Plan of Care (Signed)
 Progress Note: - Patients pain managing well. Edema on upper legs +2/3, legs weeping, patient now has a productive cough. Patient was educated to do IS and get out of bed tomorrow. Consult for heparin done by pharmacy to start tomorrow.   Problem: Education: Goal: Knowledge of General Education information will improve Description: Including pain rating scale, medication(s)/side effects and non-pharmacologic comfort measures Outcome: Progressing   Problem: Health Behavior/Discharge Planning: Goal: Ability to manage health-related needs will improve Outcome: Progressing   Problem: Clinical Measurements: Goal: Ability to maintain clinical measurements within normal limits will improve Outcome: Progressing Goal: Will remain free from infection Outcome: Progressing Goal: Diagnostic test results will improve Outcome: Progressing Goal: Respiratory complications will improve Outcome: Progressing Goal: Cardiovascular complication will be avoided Outcome: Progressing   Problem: Activity: Goal: Risk for activity intolerance will decrease Outcome: Progressing   Problem: Nutrition: Goal: Adequate nutrition will be maintained Outcome: Progressing   Problem: Coping: Goal: Level of anxiety will decrease Outcome: Progressing   Problem: Elimination: Goal: Will not experience complications related to bowel motility Outcome: Progressing Goal: Will not experience complications related to urinary retention Outcome: Progressing   Problem: Pain Managment: Goal: General experience of comfort will improve and/or be controlled Outcome: Progressing   Problem: Safety: Goal: Ability to remain free from injury will improve Outcome: Progressing   Problem: Skin Integrity: Goal: Risk for impaired skin integrity will decrease Outcome: Progressing   Problem: Clinical Measurements: Goal: Ability to avoid or minimize complications of infection will improve Outcome: Progressing   Problem: Skin  Integrity: Goal: Skin integrity will improve Outcome: Progressing

## 2023-09-08 NOTE — TOC Progression Note (Signed)
 Transition of Care [facility]) - Progression Note    Patient Details  Name: Jordan [name] MRN: [id number] Date of Birth: 1951/05/19  [facility] of [facility]) CM/SW Contact  Adrian Prows, RN Phone Number: 09/08/2023, 2:14 PM  Clinical Narrative:    Pt not ready for d/c; will initiate ins auth when nearing medical stability for d/c; TOC is following.   Expected Discharge Plan: Assisted Living Barriers to Discharge: Continued Medical Work up  Expected Discharge Plan and Services In-house Referral: Clinical Social Work     Living arrangements for the past 2 months: Assisted Living Facility                                       Social Determinants of Health (SDOH) Interventions SDOH Screenings   Food Insecurity: Patient Declined (09/01/2023)  Housing: Patient Declined (09/01/2023)  Transportation Needs: Patient Declined (09/01/2023)  Utilities: Patient Declined (09/01/2023)  Social Connections: Patient Declined (09/01/2023)  Tobacco Use: High Risk (09/01/2023)    Readmission Risk Interventions    09/04/2023    2:49 PM 04/04/2022   11:02 AM  Readmission Risk Prevention Plan  Transportation Screening Complete Complete  PCP or Specialist Appt within 5-7 Days Complete   PCP or Specialist Appt within 3-5 Days  Complete  Home Care Screening Complete   Medication Review (RN CM) Complete   HRI or Home Care Consult  Complete  Social Work Consult for Recovery Care Planning/Counseling  Complete  Palliative Care Screening  Not Applicable  Medication Review Oceanographer)  Complete

## 2023-09-08 NOTE — Progress Notes (Signed)
  Echocardiogram 2D Echocardiogram has been performed.  Ocie Doyne RDCS 09/08/2023, 3:55 PM

## 2023-09-08 NOTE — Progress Notes (Signed)
  [facility] Progress Note  Subjective:  UOP yest 1900 cc Not feeling better or worse  Vitals:   09/07/23 2006 09/07/23 2154 09/08/23 0536 09/08/23 0836  BP:  (!) 111/53 110/68   Pulse:  (!) 108 (!) 109   Resp:  18 18   Temp:  98.3 F (36.8 C) 98.6 F (37 C)   TempSrc:  Oral Oral   SpO2: 99% 95% 100% 95%  Weight:      Height:        Exam: Gen alert, no distress, elderly male Sig deconditioning  No jvd or bruits Chest clear bilat to bases RRR no MRG Abd soft ntnd no mass or ascites +bs Ext 2+ bilat hip/ buttock edema, 2+ pretib edema, oozing per RN Neuro is alert, Ox 3 , nf      Renal-related home meds: Lasix 40 mg donating with 20 mg every other day Atenolol 25 mg daily Others: Protonix, Ultram, Singulair, Ativan, Eliquis, Effexor XR, Symbicort, statin, Zetia   Chest x-ray 3/14 showed bilateral atelectasis versus pneumonia, no edema CXR 3/19 - bilat pulm edema and effusions Renal ultrasound on 3/19 showed 8 to 10 cm kidneys no hydro   UA on 3/15 - moderate hemoglobin 6-10 RBCs 6-10 WBCs 0-5 epis no protein  Last echo jan 2023 - LVEF 55-60%   Assessment/ Plan: AKI: b/l creat is 1.3, from PCP's office in Jan 2024. Assuming this is AKI but could also have CKD. UA shows some rbc's/ wbc's, no proteinuria. Renal US w/o obstruction. BP's were soft in 90s on presentation and then improved after IV abx and IVF's. Pt is now vol overloaded w/ sig hip edema, LE edema and pulm edema on CXR #2. I/O are 4 L +. Suspect CKD vs ATN and with sig +vol overload. Started IV lasix 3/19, titrating up the dose, UOP is improving. Creat stable. Cont diuresis.  Soft tissue infection: SP IV abx, is now on po keflex COPD Atrial fib S/p TAVR Chronic resp failure - on home O2 2 L HTN - bp's low, doesn't need atenolol now, will dc.     Vinson Moselle MD  [facility] 09/08/2023, 1:26 PM  Recent Labs  Lab 09/02/23 0525 09/03/23 0440 09/06/23 0458 09/07/23 0454 09/08/23 0803  HGB 7.3*    < > 7.5* 8.1* 7.6*  ALBUMIN 2.0*  --  2.0*  --   --   CALCIUM 7.7*   < > 7.7* 7.6* 7.8*  CREATININE 2.53*   < > 2.69* 2.61* 2.46*  K 3.6   < > 4.5 4.6 4.4   < > = values in this interval not displayed.   Recent Labs  Lab 09/03/23 0440  IRON 22*  TIBC 203*  FERRITIN 57   Inpatient medications:  apixaban  5 mg Oral BID   atorvastatin  80 mg Oral QODAY   ipratropium-albuterol  3 mL Nebulization BID   LORazepam  1 mg Oral BID   mometasone-formoterol  2 puff Inhalation BID   multivitamin with minerals  1 tablet Oral Q breakfast   pantoprazole  40 mg Oral Daily   venlafaxine XR  75 mg Oral Q breakfast    furosemide     acetaminophen **OR** acetaminophen, albuterol, alum & mag hydroxide-simeth, melatonin, ondansetron (ZOFRAN) IV, oxyCODONE, polyethylene glycol, traMADol

## 2023-09-09 DIAGNOSIS — L089 Local infection of the skin and subcutaneous tissue, unspecified: Secondary | ICD-10-CM | POA: Diagnosis not present

## 2023-09-09 LAB — CBC
HCT: 26.5 % — ABNORMAL LOW (ref 39.0–52.0)
Hemoglobin: 7.5 g/dL — ABNORMAL LOW (ref 13.0–17.0)
MCH: 21.7 pg — ABNORMAL LOW (ref 26.0–34.0)
MCHC: 28.3 g/dL — ABNORMAL LOW (ref 30.0–36.0)
MCV: 76.8 fL — ABNORMAL LOW (ref 80.0–100.0)
Platelets: 279 10*3/uL (ref 150–400)
RBC: 3.45 MIL/uL — ABNORMAL LOW (ref 4.22–5.81)
RDW: 22.2 % — ABNORMAL HIGH (ref 11.5–15.5)
WBC: 8.7 10*3/uL (ref 4.0–10.5)
nRBC: 0 % (ref 0.0–0.2)

## 2023-09-09 LAB — RENAL FUNCTION PANEL
Albumin: 2 g/dL — ABNORMAL LOW (ref 3.5–5.0)
Anion gap: 8 (ref 5–15)
BUN: 60 mg/dL — ABNORMAL HIGH (ref 8–23)
CO2: 23 mmol/L (ref 22–32)
Calcium: 7.9 mg/dL — ABNORMAL LOW (ref 8.9–10.3)
Chloride: 104 mmol/L (ref 98–111)
Creatinine, Ser: 2.22 mg/dL — ABNORMAL HIGH (ref 0.61–1.24)
GFR, Estimated: 31 mL/min — ABNORMAL LOW (ref >60)
Glucose, Bld: 155 mg/dL — ABNORMAL HIGH (ref 70–99)
Phosphorus: 4.6 mg/dL (ref 2.5–4.6)
Potassium: 3.9 mmol/L (ref 3.5–5.1)
Sodium: 135 mmol/L (ref 135–145)

## 2023-09-09 LAB — APTT: aPTT: 48 s — ABNORMAL HIGH (ref 24–36)

## 2023-09-09 LAB — HEPARIN LEVEL (UNFRACTIONATED): Heparin Unfractionated: >1.1 [IU]/mL — ABNORMAL HIGH (ref 0.30–0.70)

## 2023-09-09 MED ORDER — HEPARIN BOLUS VIA INFUSION
1000.0000 [IU] | Freq: Once | INTRAVENOUS | Status: AC
  Administered 2023-09-09: 1000 [IU] via INTRAVENOUS
  Filled 2023-09-09: qty 1000

## 2023-09-09 NOTE — Progress Notes (Signed)
 Mobility Specialist Cancellation/Refusal Note:    09/09/23 1317  Mobility  Activity Refused mobility   Pt declined mobility today twice due to wanting to rest. Pt stated he will try tomorrow. Will check back as schedule permits.   [facility]

## 2023-09-09 NOTE — Progress Notes (Signed)
 [facility] Progress Note  Subjective:  UOP yest 2.7 L , improving  Maybe a little bit better   Vitals:   09/09/23 0500 09/09/23 0609 09/09/23 0622 09/09/23 1336  BP:  (!) 157/77  117/80  Pulse:  (!) 109    Resp:  20  16  Temp:  97.6 F (36.4 C)    TempSrc:  Oral    SpO2:  99% 99% 100%  Weight: 105.3 kg     Height:        Exam: Gen alert, no distress, elderly male Sig deconditioning  No jvd or bruits Chest clear bilat to bases RRR no MRG Abd soft ntnd no mass or ascites +bs Ext 1+ hip edema, 1-2+ pretib edema, starting to improve Neuro is alert, Ox 3 , nf      Renal-related home meds: Lasix 40 mg donating with 20 mg every other day Atenolol 25 mg daily Others: Protonix, Ultram, Singulair, Ativan, Eliquis, Effexor XR, Symbicort, statin, Zetia   Chest x-ray 3/14 showed bilateral atelectasis versus pneumonia, no edema CXR 3/19 - bilat pulm edema and effusions Renal ultrasound on 3/19 showed 8 to 10 cm kidneys no hydro   UA on 3/15 - moderate hemoglobin 6-10 RBCs 6-10 WBCs 0-5 epis no protein  ECHO 09/08/23 - LVEF 60-65%   Assessment/ Plan: AKI: b/l creat is 1.3, from PCP's office in Jan 2024. Assuming this is AKI but could also have CKD. UA shows some rbc's/ wbc's, no proteinuria. Renal US w/o obstruction. BP's were soft in 90s on presentation and then improved after IV abx and IVF's. Pt is now vol overloaded w/ sig hip edema, LE edema and pulm edema on CXR #2. I/O are 4 L +. Suspect CKD vs ATN and with sig +vol overload. Started IV lasix 3/19, titrating up the dose and the UOP is improving. 2.7 L UOP yest and creat down to 2.2 today. Cont diuresis.  Soft tissue infection: SP IV abx, is now on po keflex COPD Atrial fib S/p TAVR Chronic resp failure - on home O2 2 L HTN - bp's low, doesn't need atenolol now, will dc.     Vinson Moselle MD  [facility] 09/09/2023, 2:07 PM  Recent Labs  Lab 09/06/23 0458 09/07/23 0454 09/08/23 0803 09/08/23 1710  09/09/23 1022  HGB 7.5*   < > 7.6* 7.6*  --   ALBUMIN 2.0*  --   --   --  2.0*  CALCIUM 7.7*   < > 7.8*  --  7.9*  PHOS  --   --   --   --  4.6  CREATININE 2.69*   < > 2.46*  --  2.22*  K 4.5   < > 4.4  --  3.9   < > = values in this interval not displayed.   Recent Labs  Lab 09/03/23 0440  IRON 22*  TIBC 203*  FERRITIN 57   Inpatient medications:  atorvastatin  80 mg Oral QODAY   cephALEXin  500 mg Oral Q8H   doxycycline  100 mg Oral Q12H   ipratropium-albuterol  3 mL Nebulization BID   LORazepam  1 mg Oral BID   metolazone  5 mg Oral Daily   mometasone-formoterol  2 puff Inhalation BID   multivitamin with minerals  1 tablet Oral Q breakfast   pantoprazole  40 mg Oral Daily   venlafaxine XR  75 mg Oral Q breakfast    furosemide 120 mg (09/09/23 0509)   heparin 1,150 Units/hr (09/09/23  1035)   acetaminophen **OR** acetaminophen, albuterol, alum & mag hydroxide-simeth, melatonin, ondansetron (ZOFRAN) IV, oxyCODONE, polyethylene glycol, traMADol

## 2023-09-09 NOTE — Progress Notes (Signed)
 PROGRESS NOTE    Jordan [name]  ZOX:[id number] DOB: 07-Jun-1951 DOA: 09/01/2023 PCP: Kennith Center, NP   Brief Narrative: 73 year old with past medical history significant for hypertension, heart failure preserved ejection fraction, A-fib, CAD, COPD with chronic hypoxic respiratory failure on 2 L of oxygen, chronic venous stasis, PVD admitted 3/17 for worsening cellulitis failing outpatient antibiotics, after injury at nursing home previous week.     Assessment & Plan:   Principal Problem:   Soft tissue infection   1-Soft tissue infection, LE infection, ulcers.  Chronic venous stasis disease PVD -ABI: Right: Resting right ankle-brachial index is within normal range. Unable to obtain TBI due to great toe ulceration.  Left:  Unable to obtain ABI due to patient pain tolerance and movement.  Monophasic waveforms are detected in the dorsalis pedis and posterior  tibial arteries.  Unable to obtain TBI due to great toe ulceration.  -Continue local care.  -Antibiotics were change to Keflex. Extend course to complete 10 days, still having significant redness, added  Doxy.  -Vascular consulted. Plan for arteriogram on Tuesday, needs to hold Eliquis Saturday,. It can be done out patient.  Remain in hospital for renal failure, diuretics. Transition to heparin.    AKI, on ? CKD -Previous cr 0.9 (2023) -Creatinine increasing  at 2.6 -Renal ultrasound obtained negative for hydronephrosis -Strict I's and O's -Nephrology consulted, managing diuresis.  -Chest x-ray with pulmonary edema -Cr 2.2 -3/21: Lasix increased to 120 mg Q 8 Hours.  2.3 L urine out put on 3/21.  Hypokalemia: Resolved  Hypertension: Holding  atenolol to avoid hypotension  A-fib: Plan to hold eliquis tomorrow, for anticipation arteriogram on Tuesday. Heparin started 3/21.  Macrocytic anemia: Continue to monitor hemoglobin, transfuse for hemoglobin less than 7   COPD Chronic hypoxic respiratory failure on 2 L  of oxygen 3/19: Wheezing, started scheduled albuterol  Anxiety depression: Continue Effexor and Ativan   Pressure Injury 09/02/23 Heel Left Unstageable - Full thickness tissue loss in which the base of the injury is covered by slough (yellow, tan, gray, green or brown) and/or eschar (tan, brown or black) in the wound bed. (Active)  09/02/23 1713  Location: Heel  Location Orientation: Left  Staging: Unstageable - Full thickness tissue loss in which the base of the injury is covered by slough (yellow, tan, gray, green or brown) and/or eschar (tan, brown or black) in the wound bed.  Wound Description (Comments):   Present on Admission: Yes  Dressing Type Foam - Lift dressing to assess site every shift 09/08/23 2054     Estimated body mass index is 30.63 kg/m as calculated from the following:   Height as of this encounter: 6\' 1"  (1.854 m).   Weight as of this encounter: 105.3 kg.   DVT prophylaxis: Eliquis Code Status: Full code Family Communication: Discussed with patient Disposition Plan:  Status is: Inpatient Remains inpatient appropriate because: Management of AKI and lower extremity wounds    Consultants:  Nephrology Vascular  Procedures:  Renal US  Antimicrobials:    Subjective: He report breathing ok still having significant LE edema.   Objective: Vitals:   09/08/23 2120 09/09/23 0500 09/09/23 0609 09/09/23 0622  BP: (!) 102/54  (!) 157/77   Pulse: (!) 103  (!) 109   Resp: 20  20   Temp: 97.6 F (36.4 C)  97.6 F (36.4 C)   TempSrc: Oral  Oral   SpO2: 99%  99% 99%  Weight:  105.3 kg    Height:  Intake/Output Summary (Last 24 hours) at 09/09/2023 0649 Last data filed at 09/09/2023 8295 Gross per 24 hour  Intake 840 ml  Output 2300 ml  Net -1460 ml   Filed Weights   09/06/23 1942 09/07/23 0500 09/09/23 0500  Weight: 102.3 kg 103.6 kg 105.3 kg    Examination:  General exam: NAD Respiratory system: Normal resp effort, CTA Cardiovascular  system: S 1, S 2 RRR Gastrointestinal system: BS present, soft, nt Central nervous system: Non focal.  Extremities: BL LE ulceration, toe with ulceration LE with persistent redness.    Data Reviewed: I have personally reviewed following labs and imaging studies  CBC: Recent Labs  Lab 09/04/23 0407 09/06/23 0458 09/07/23 0454 09/08/23 0803 09/08/23 1710  WBC 11.6* 10.1 8.8 7.9 9.7  NEUTROABS 9.1*  --   --   --   --   HGB 7.4* 7.5* 8.1* 7.6* 7.6*  HCT 27.1* 27.3* 29.2* 27.2* 27.3*  MCV 78.1* 76.7* 77.2* 77.9* 77.8*  PLT 241 252 265 259 274   Basic Metabolic Panel: Recent Labs  Lab 09/04/23 0407 09/05/23 0444 09/06/23 0458 09/07/23 0454 09/08/23 0803  NA 138 135 132* 132* 135  K 3.5 4.1 4.5 4.6 4.4  CL 109 105 104 103 104  CO2 22 22 21* 21* 21*  GLUCOSE 114* 96 89 90 88  BUN 37* 43* 39* 58* 59*  CREATININE 2.05* 2.50* 2.69* 2.61* 2.46*  CALCIUM 7.7* 7.9* 7.7* 7.6* 7.8*   GFR: Estimated Creatinine Clearance: 34.1 mL/min (A) (by C-G formula based on SCr of 2.46 mg/dL (H)). Liver Function Tests: Recent Labs  Lab 09/06/23 0458  AST 24  ALT 16  ALKPHOS 128*  BILITOT 0.6  PROT 6.0*  ALBUMIN 2.0*   No results for input(s): "LIPASE", "AMYLASE" in the last 168 hours. No results for input(s): "AMMONIA" in the last 168 hours. Coagulation Profile: No results for input(s): "INR", "PROTIME" in the last 168 hours. Cardiac Enzymes: No results for input(s): "CKTOTAL", "CKMB", "CKMBINDEX", "TROPONINI" in the last 168 hours. BNP (last 3 results) No results for input(s): "PROBNP" in the last 8760 hours. HbA1C: No results for input(s): "HGBA1C" in the last 72 hours. CBG: No results for input(s): "GLUCAP" in the last 168 hours. Lipid Profile: No results for input(s): "CHOL", "HDL", "LDLCALC", "TRIG", "CHOLHDL", "LDLDIRECT" in the last 72 hours. Thyroid Function Tests: No results for input(s): "TSH", "T4TOTAL", "FREET4", "T3FREE", "THYROIDAB" in the last 72 hours. Anemia  Panel: No results for input(s): "VITAMINB12", "FOLATE", "FERRITIN", "TIBC", "IRON", "RETICCTPCT" in the last 72 hours. Sepsis Labs: No results for input(s): "PROCALCITON", "LATICACIDVEN" in the last 168 hours.   Recent Results (from the past 240 hours)  Culture, blood (routine x 2)     Status: None   Collection Time: 09/01/23 12:25 PM   Specimen: BLOOD LEFT ARM  Result Value Ref Range Status   Specimen Description   Final    BLOOD LEFT ARM Performed at [facility], [address] [place], [place] [address]    Special Requests   Final    BOTTLES DRAWN AEROBIC AND ANAEROBIC Blood Culture adequate volume Performed at [facility], [address], [place], [place] [address]    Culture   Final    NO GROWTH 5 DAYS Performed at [facility], [address] [place], [place] [address]    Report Status 09/06/2023 FINAL  Final  Culture, blood (routine x 2)     Status: None   Collection Time: 09/01/23  1:18 PM   Specimen: BLOOD RIGHT FOREARM  Result Value Ref Range Status   Specimen Description   Final    BLOOD RIGHT FOREARM Performed at [facility], [address] [place], [place] [address]    Special Requests   Final    BOTTLES DRAWN AEROBIC AND ANAEROBIC Blood Culture adequate volume Performed at [facility], [address], [place], [place] [address]    Culture   Final    NO GROWTH 5 DAYS Performed at [facility], [address] [place], [place] [address]    Report Status 09/06/2023 FINAL  Final  Resp panel by RT-PCR (RSV, Flu A&B, Covid) Anterior Nasal Swab     Status: None   Collection Time: 09/01/23  1:18 PM   Specimen: Anterior Nasal Swab  Result Value Ref Range Status   SARS Coronavirus 2 by RT PCR NEGATIVE NEGATIVE Final    Comment: (NOTE) SARS-CoV-2 target nucleic acids are NOT DETECTED.  The SARS-CoV-2 RNA is generally detectable in upper respiratory specimens during the acute phase of infection.  The lowest concentration of SARS-CoV-2 viral copies this assay can detect is 138 copies/mL. A negative result does not preclude SARS-Cov-2 infection and should not be used as the sole basis for treatment or other patient management decisions. A negative result may occur with  improper specimen collection/handling, submission of specimen other than nasopharyngeal swab, presence of viral mutation(s) within the areas targeted by this assay, and inadequate number of viral copies(<138 copies/mL). A negative result must be combined with clinical observations, patient history, and epidemiological information. The expected result is Negative.  Fact Sheet for Patients:  BloggerCourse.com  Fact Sheet for Healthcare Providers:  SeriousBroker.it  This test is no t yet approved or cleared by the [place] FDA and  has been authorized for detection and/or diagnosis of SARS-CoV-2 by FDA under an Emergency Use Authorization (EUA). This EUA will remain  in effect (meaning this test can be used) for the duration of the COVID-19 declaration under Section 564(b)(1) of the Act, 21 U.S.C.section 360bbb-3(b)(1), unless the authorization is terminated  or revoked sooner.       Influenza A by PCR NEGATIVE NEGATIVE Final   Influenza B by PCR NEGATIVE NEGATIVE Final    Comment: (NOTE) The Xpert Xpress SARS-CoV-2/FLU/RSV plus assay is intended as an aid in the diagnosis of influenza from Nasopharyngeal swab specimens and should not be used as a sole basis for treatment. Nasal washings and aspirates are unacceptable for Xpert Xpress SARS-CoV-2/FLU/RSV testing.  Fact Sheet for Patients: BloggerCourse.com  Fact Sheet for Healthcare Providers: SeriousBroker.it  This test is not yet approved or cleared by the [place] FDA and has been authorized for detection and/or diagnosis of SARS-CoV-2 by FDA  under an Emergency Use Authorization (EUA). This EUA will remain in effect (meaning this test can be used) for the duration of the COVID-19 declaration under Section 564(b)(1) of the Act, 21 U.S.C. section 360bbb-3(b)(1), unless the authorization is terminated or revoked.     Resp Syncytial Virus by PCR NEGATIVE NEGATIVE Final    Comment: (NOTE) Fact Sheet for Patients: BloggerCourse.com  Fact Sheet for Healthcare Providers: SeriousBroker.it  This test is not yet approved or cleared by the [place] FDA and has been authorized for detection and/or diagnosis of SARS-CoV-2 by FDA under an Emergency Use Authorization (EUA). This EUA will remain in effect (meaning this test can be used) for the duration of the COVID-19 declaration under Section  564(b)(1) of the Act, 21 U.S.C. section 360bbb-3(b)(1), unless the authorization is terminated or revoked.  Performed at [facility], [address], [place], [place] [address]   MRSA Next Gen by PCR, Nasal     Status: None   Collection Time: 09/02/23 11:45 AM   Specimen: Nasal Mucosa; Nasal Swab  Result Value Ref Range Status   MRSA by PCR Next Gen NOT DETECTED NOT DETECTED Final    Comment: (NOTE) The GeneXpert MRSA Assay (FDA approved for NASAL specimens only), is one component of a comprehensive MRSA colonization surveillance program. It is not intended to diagnose MRSA infection nor to guide or monitor treatment for MRSA infections. Test performance is not FDA approved in patients less than 37 years old. Performed at [facility], [address], [place], [place] [address]          Radiology Studies: ECHOCARDIOGRAM COMPLETE Result Date: 09/08/2023    ECHOCARDIOGRAM REPORT   Patient Name:   Jordan [name] Date of Exam: 09/08/2023 Medical Rec #:  [id number]         Height:       73.0 in Accession #:    [id number]        Weight:        228.4 lb Date of Birth:  01/02/1951         BSA:          2.277 m Patient Age:    72 years          BP:           116/69 mmHg Patient Gender: M                 HR:           102 bpm. Exam Location:  Inpatient Procedure: 2D Echo, Cardiac Doppler, Color Doppler and Intracardiac            Opacification Agent (Both Spectral and Color Flow Doppler were            utilized during procedure). Indications:    Abnormal ECG  History:        Patient has prior history of Echocardiogram examinations, most                 recent 07/14/2021. CAD, PAD and TIA, Arrythmias:Bradycardia; Risk                 Factors:Dyslipidemia.                 Aortic Valve: 26 mm Sapien prosthetic, stented (TAVR) valve is                 present in the aortic position.  Sonographer:    Vern Claude Referring Phys: [id number] [name] Bartram A Darrelyn Morro  Sonographer Comments: Image acquisition challenging due to patient body habitus and Image acquisition challenging due to respiratory motion. IMPRESSIONS  1. Very poor study, even with Definity contrast - almost uninterpretable.  2. Left ventricular ejection fraction appears grossly normal - estimated at 60 to 65%. The left ventricle has normal function. The left ventricle has no regional wall motion abnormalities. Left ventricular diastolic parameters are indeterminate.  3. Right ventricular systolic function was not well visualized. The right ventricular size is not well visualized.  4. The mitral valve was not well visualized. No evidence of mitral valve regurgitation.  5. TAVR valve is not well-visualized. The aortic valve was not well visualized. Aortic valve regurgitation is not visualized.  There is a 26 mm Sapien prosthetic (TAVR) valve present in the aortic position. Comparison(s): Changes from prior study are noted. 07/14/2021: LVEF 55-60%, TAVR valve. FINDINGS  Left Ventricle: Left ventricular ejection fraction, by estimation, is 60 to 65%. The left ventricle has normal function. Left ventricular endocardial  border not optimally defined to evaluate regional wall motion. Definity contrast agent was given IV to delineate the left ventricular endocardial borders. The left ventricular internal cavity size was normal in size. There is no left ventricular hypertrophy. Left ventricular diastolic parameters are indeterminate. Right Ventricle: The right ventricular size is not well visualized. Right vetricular wall thickness was not well visualized. Right ventricular systolic function was not well visualized. Left Atrium: Left atrial size was not well visualized. Right Atrium: Right atrial size was not well visualized. Pericardium: The pericardium was not well visualized. Mitral Valve: The mitral valve was not well visualized. No evidence of mitral valve regurgitation. MV peak gradient, 5.8 mmHg. The mean mitral valve gradient is 2.0 mmHg. Tricuspid Valve: The tricuspid valve is not well visualized. Tricuspid valve regurgitation is not demonstrated. Aortic Valve: TAVR valve is not well-visualized. The aortic valve was not well visualized. Aortic valve regurgitation is not visualized. Aortic valve mean gradient measures 2.0 mmHg. Aortic valve peak gradient measures 3.9 mmHg. Aortic valve area, by VTI  measures 2.12 cm. There is a 26 mm Sapien prosthetic, stented (TAVR) valve present in the aortic position. Pulmonic Valve: The pulmonic valve was not well visualized. Pulmonic valve regurgitation is not visualized. Aorta: The aortic root was not well visualized. IAS/Shunts: The interatrial septum was not well visualized.  LEFT VENTRICLE PLAX 2D LVIDd:         5.80 cm LVIDs:         4.10 cm LV PW:         0.80 cm LV IVS:        1.00 cm LVOT diam:     2.10 cm LV SV:         38 LV SV Index:   17 LVOT Area:     3.46 cm  LV Volumes (MOD) LV vol d, MOD A4C: 64.9 ml LV vol s, MOD A4C: 23.2 ml LV SV MOD A4C:     64.9 ml RIGHT VENTRICLE RV S prime:     8.49 cm/s LEFT ATRIUM           Index        RIGHT ATRIUM           Index LA diam:       4.50 cm 1.98 cm/m   RA Area:     12.80 cm LA Vol (A2C): 32.5 ml 14.28 ml/m  RA Volume:   30.40 ml  13.35 ml/m LA Vol (A4C): 89.7 ml 39.40 ml/m  AORTIC VALVE AV Area (Vmax):    2.46 cm AV Area (Vmean):   2.18 cm AV Area (VTI):     2.12 cm AV Vmax:           99.10 cm/s AV Vmean:          64.600 cm/s AV VTI:            0.178 m AV Peak Grad:      3.9 mmHg AV Mean Grad:      2.0 mmHg LVOT Vmax:         70.50 cm/s LVOT Vmean:        40.600 cm/s LVOT VTI:  0.109 m LVOT/AV VTI ratio: 0.61  AORTA Ao Root diam: 3.70 cm MITRAL VALVE MV Area (PHT): 5.31 cm     SHUNTS MV Area VTI:   1.38 cm     Systemic VTI:  0.11 m MV Peak grad:  5.8 mmHg     Systemic Diam: 2.10 cm MV Mean grad:  2.0 mmHg MV Vmax:       1.20 m/s MV Vmean:      73.2 cm/s MV Decel Time: 143 msec MV E velocity: 120.00 cm/s Zoila Shutter MD Electronically signed by Zoila Shutter MD Signature Date/Time: 09/08/2023/4:59:33 PM    Final          Scheduled Meds:  atorvastatin  80 mg Oral QODAY   cephALEXin  500 mg Oral Q8H   doxycycline  100 mg Oral Q12H   ipratropium-albuterol  3 mL Nebulization BID   LORazepam  1 mg Oral BID   metolazone  5 mg Oral Daily   mometasone-formoterol  2 puff Inhalation BID   multivitamin with minerals  1 tablet Oral Q breakfast   pantoprazole  40 mg Oral Daily   venlafaxine XR  75 mg Oral Q breakfast   Continuous Infusions:  furosemide 120 mg (09/09/23 0509)   heparin       LOS: 8 days    Time spent: 35 minutes    Albaraa Swingle A Dawne Casali, MD [facility]   If 7PM-7AM, please contact night-coverage [email or web address]  09/09/2023, 6:49 AM

## 2023-09-09 NOTE — Progress Notes (Signed)
 PHARMACY - ANTICOAGULATION CONSULT NOTE  Pharmacy Consult for  Eliquis --> heparin  Indication: hx atrial fibrillation  Allergies  Allergen Reactions   Altace [Ramipril] Other (See Comments)    Dizziness, migraine, weakness- "there is dye in it"   Codeine Itching   Metoprolol Tartrate Other (See Comments)    Dropped the B/P too low- eventually stopped by a MD   Naproxen Itching    Patient Measurements: Height: 6\' 1"  (185.4 cm) Weight: 105.3 kg (232 lb 2.3 oz) IBW/kg (Calculated) : 79.9 Heparin Dosing Weight: 81 kg  Vital Signs: BP: 117/80 (03/22 1336)  Labs: Recent Labs    09/07/23 0454 09/08/23 0803 09/08/23 1710 09/09/23 1022 09/09/23 1639 09/09/23 1839  HGB 8.1* 7.6* 7.6*  --  7.5*  --   HCT 29.2* 27.2* 27.3*  --  26.5*  --   PLT 265 259 274  --  279  --   APTT  --   --   --   --   --  48*  HEPARINUNFRC  --   --   --   --   --  >1.10*  CREATININE 2.61* 2.46*  --  2.22*  --   --     Estimated Creatinine Clearance: 37.8 mL/min (A) (by C-G formula based on SCr of 2.22 mg/dL (H)).   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 Excell Seltzer and Dr. Laneta Simmers   Severe aortic stenosis    Severe aortic insufficiency, moderate to severe aortic stenosis   Sleep apnea    no longer uses cpap   Venous insufficiency    managed by ortho (Dr. Lajoyce Corners)    Medications:  - on Eliquis 5 mg bid PTA    Assessment: Patient is a 73 y.o M with hx CKD, PVD, bilateral venous stasis disease and afib on Eliquis PTA who presented to the ED on 09/01/23 with worsening of left leg wound/pain sustained after an injury at the nursing facility. He was started on abx for wound infection and Eliquis resumed on admission. ABIs performed on 09/05/22, but they were unable to fully do ABI of the left leg due to pt's inability to keep still. Pharmacy has been consulted to change anticoag. to heparin drip on 09/09/23 in anticipation for angiography on 09/12/23 to evaluate blood flow to his left leg.   Today, 09/09/2023: 1st aPTT SUBtherapeutic on current IV heparin rate of 1150 units/hr HL > 1 due to Eliquis administration 3/14 - 3/21 CBC - Hgb 7.5, stable. Plts 279, stable Per RN, no bleeding or issues noted  Goal of Therapy:  Heparin level 0.3-0.7 units/ml aPTT 66-102 seconds Monitor platelets by anticoagulation protocol: Yes   Plan:  Bolus IV heparin 1000 units/hr then increase IV heparin rate from  1150 to 1300 units/hr Recheck 8 hr heparin level and aPTT after heparin rate increase Monitor for s/sx bleeding    Berkley Harvey 09/09/2023,7:30 PM

## 2023-09-10 DIAGNOSIS — L089 Local infection of the skin and subcutaneous tissue, unspecified: Secondary | ICD-10-CM | POA: Diagnosis not present

## 2023-09-10 LAB — RENAL FUNCTION PANEL
Albumin: 2.1 g/dL — ABNORMAL LOW (ref 3.5–5.0)
Anion gap: 10 (ref 5–15)
BUN: 57 mg/dL — ABNORMAL HIGH (ref 8–23)
CO2: 26 mmol/L (ref 22–32)
Calcium: 8 mg/dL — ABNORMAL LOW (ref 8.9–10.3)
Chloride: 101 mmol/L (ref 98–111)
Creatinine, Ser: 2.22 mg/dL — ABNORMAL HIGH (ref 0.61–1.24)
GFR, Estimated: 31 mL/min — ABNORMAL LOW (ref >60)
Glucose, Bld: 91 mg/dL (ref 70–99)
Phosphorus: 4.7 mg/dL — ABNORMAL HIGH (ref 2.5–4.6)
Potassium: 3.8 mmol/L (ref 3.5–5.1)
Sodium: 137 mmol/L (ref 135–145)

## 2023-09-10 LAB — CBC
HCT: 27.1 % — ABNORMAL LOW (ref 39.0–52.0)
Hemoglobin: 7.5 g/dL — ABNORMAL LOW (ref 13.0–17.0)
MCH: 21.7 pg — ABNORMAL LOW (ref 26.0–34.0)
MCHC: 27.7 g/dL — ABNORMAL LOW (ref 30.0–36.0)
MCV: 78.3 fL — ABNORMAL LOW (ref 80.0–100.0)
Platelets: 294 10*3/uL (ref 150–400)
RBC: 3.46 MIL/uL — ABNORMAL LOW (ref 4.22–5.81)
RDW: 22.5 % — ABNORMAL HIGH (ref 11.5–15.5)
WBC: 8.6 10*3/uL (ref 4.0–10.5)
nRBC: 0 % (ref 0.0–0.2)

## 2023-09-10 LAB — APTT
aPTT: 87 s — ABNORMAL HIGH (ref 24–36)
aPTT: 153 s — ABNORMAL HIGH (ref 24–36)
aPTT: >200 s (ref 24–36)

## 2023-09-10 LAB — HEPARIN LEVEL (UNFRACTIONATED): Heparin Unfractionated: >1.1 [IU]/mL — ABNORMAL HIGH (ref 0.30–0.70)

## 2023-09-10 MED ORDER — FUROSEMIDE 10 MG/ML IJ SOLN
80.0000 mg | Freq: Three times a day (TID) | INTRAMUSCULAR | Status: DC
  Administered 2023-09-10 – 2023-09-11 (×2): 80 mg via INTRAVENOUS
  Filled 2023-09-10 (×4): qty 8

## 2023-09-10 MED ORDER — SODIUM CHLORIDE 0.9 % IV SOLN
100.0000 mg | Freq: Once | INTRAVENOUS | Status: AC
  Administered 2023-09-10: 100 mg via INTRAVENOUS
  Filled 2023-09-10: qty 5

## 2023-09-10 MED ORDER — ALBUMIN HUMAN 25 % IV SOLN
25.0000 g | Freq: Four times a day (QID) | INTRAVENOUS | Status: AC
  Administered 2023-09-10 (×2): 25 g via INTRAVENOUS
  Filled 2023-09-10 (×2): qty 100

## 2023-09-10 MED ORDER — HEPARIN (PORCINE) 25000 UT/250ML-% IV SOLN
800.0000 [IU]/h | INTRAVENOUS | Status: DC
  Administered 2023-09-11: 800 [IU]/h via INTRAVENOUS
  Filled 2023-09-10 (×2): qty 250

## 2023-09-10 NOTE — Progress Notes (Addendum)
 PROGRESS NOTE    Jordan [name]  QMV:[id number] DOB: [birth year] DOA: 09/01/2023 PCP: Kennith Center, NP   Brief Narrative: 73 year old with past medical history significant for hypertension, heart failure preserved ejection fraction, A-fib, CAD, COPD with chronic hypoxic respiratory failure on 2 L of oxygen, chronic venous stasis, PVD admitted 3/17 for worsening cellulitis failing outpatient antibiotics, after injury at nursing home previous week.     Assessment & Plan:   Principal Problem:   Soft tissue infection   1-Soft tissue infection, LE infection, ulcers.  Chronic venous stasis disease PVD -ABI: Right: Resting right ankle-brachial index is within normal range. Unable to obtain TBI due to great toe ulceration.  Left:  Unable to obtain ABI due to patient pain tolerance and movement.  Monophasic waveforms are detected in the dorsalis pedis and posterior  tibial arteries.  Unable to obtain TBI due to great toe ulceration.  -Continue local care.  -Antibiotics were change to Keflex. Extend course to complete 10 days, still having significant redness, added  Doxy.  -Vascular consulted. Plan for arteriogram on Tuesday, needs to hold Eliquis Saturday,. It can be done out patient.  Remain in hospital for renal failure, diuretics. Transition to heparin.  Redness less intense.   AKI, on ? CKD -Previous cr 0.9 (2023) -Creatinine peak  at 2.6 -Renal ultrasound obtained negative for hydronephrosis -Strict I's and O's -Nephrology consulted, managing diuresis.  -Chest x-ray with pulmonary edema -Cr 2.2 -3/21: Lasix increased to 120 mg Q 8 Hours.  4 L urine out put yesterday. Continue diuretics.    Hypokalemia: Resolved  Hypertension: Holding  atenolol to avoid hypotension  A-fib: Plan to hold eliquis tomorrow, for anticipation arteriogram on Tuesday. Heparin started 3/21.  Macrocytic anemia: Continue to monitor hemoglobin, transfuse for hemoglobin less than  7   COPD Chronic hypoxic respiratory failure on 2 L of oxygen 3/19: Wheezing, started scheduled albuterol  Anxiety depression: Continue Effexor and Ativan  Anemia; iron deficiency:  Hb at 7.5. will give IV iron.    Pressure Injury 09/02/23 Heel Left Unstageable - Full thickness tissue loss in which the base of the injury is covered by slough (yellow, tan, gray, green or brown) and/or eschar (tan, brown or black) in the wound bed. (Active)  09/02/23 1713  Location: Heel  Location Orientation: Left  Staging: Unstageable - Full thickness tissue loss in which the base of the injury is covered by slough (yellow, tan, gray, green or brown) and/or eschar (tan, brown or black) in the wound bed.  Wound Description (Comments):   Present on Admission: Yes  Dressing Type Foam - Lift dressing to assess site every shift 09/10/23 0937     Estimated body mass index is 30.63 kg/m as calculated from the following:   Height as of this encounter: 6\' 1"  (1.854 m).   Weight as of this encounter: 105.3 kg.   DVT prophylaxis: Eliquis Code Status: Full code Family Communication: Discussed with patient Disposition Plan:  Status is: Inpatient Remains inpatient appropriate because: Management of AKI and lower extremity wounds    Consultants:  Nephrology Vascular  Procedures:  Renal US  Antimicrobials:    Subjective: Feels better, less swelling LE Redness less intense LE Objective: Vitals:   09/09/23 2110 09/09/23 2128 09/10/23 0543 09/10/23 1336  BP:   126/68 95/63  Pulse:   (!) 110 (!) 102  Resp:   18 18  Temp:   97.7 F (36.5 C) 98 F (36.7 C)  TempSrc:   Oral Oral  SpO2: 91% 99% 98% 99%  Weight:      Height:        Intake/Output Summary (Last 24 hours) at 09/10/2023 1339 Last data filed at 09/10/2023 1325 Gross per 24 hour  Intake 1726.76 ml  Output 4500 ml  Net -2773.24 ml   Filed Weights   09/06/23 1942 09/07/23 0500 09/09/23 0500  Weight: 102.3 kg 103.6 kg 105.3  kg    Examination:  General exam: NAD Respiratory system: Normal resp effort, less crackles.  Cardiovascular system:S 1, S 2 RRR Gastrointestinal system: BS present, soft, nt Central nervous system: alert, follows command Extremities: BL LE ulceration, toe with ulceration LE with persistent redness.    Data Reviewed: I have personally reviewed following labs and imaging studies  CBC: Recent Labs  Lab 09/04/23 0407 09/06/23 0458 09/07/23 0454 09/08/23 0803 09/08/23 1710 09/09/23 1639 09/10/23 0503  WBC 11.6*   < > 8.8 7.9 9.7 8.7 8.6  NEUTROABS 9.1*  --   --   --   --   --   --   HGB 7.4*   < > 8.1* 7.6* 7.6* 7.5* 7.5*  HCT 27.1*   < > 29.2* 27.2* 27.3* 26.5* 27.1*  MCV 78.1*   < > 77.2* 77.9* 77.8* 76.8* 78.3*  PLT 241   < > 265 259 274 279 294   < > = values in this interval not displayed.   Basic Metabolic Panel: Recent Labs  Lab 09/06/23 0458 09/07/23 0454 09/08/23 0803 09/09/23 1022 09/10/23 0457  NA 132* 132* 135 135 137  K 4.5 4.6 4.4 3.9 3.8  CL 104 103 104 104 101  CO2 21* 21* 21* 23 26  GLUCOSE 89 90 88 155* 91  BUN 39* 58* 59* 60* 57*  CREATININE 2.69* 2.61* 2.46* 2.22* 2.22*  CALCIUM 7.7* 7.6* 7.8* 7.9* 8.0*  PHOS  --   --   --  4.6 4.7*   GFR: Estimated Creatinine Clearance: 37.8 mL/min (A) (by C-G formula based on SCr of 2.22 mg/dL (H)). Liver Function Tests: Recent Labs  Lab 09/06/23 0458 09/09/23 1022 09/10/23 0457  AST 24  --   --   ALT 16  --   --   ALKPHOS 128*  --   --   BILITOT 0.6  --   --   PROT 6.0*  --   --   ALBUMIN 2.0* 2.0* 2.1*   No results for input(s): "LIPASE", "AMYLASE" in the last 168 hours. No results for input(s): "AMMONIA" in the last 168 hours. Coagulation Profile: No results for input(s): "INR", "PROTIME" in the last 168 hours. Cardiac Enzymes: No results for input(s): "CKTOTAL", "CKMB", "CKMBINDEX", "TROPONINI" in the last 168 hours. BNP (last 3 results) No results for input(s): "PROBNP" in the last 8760  hours. HbA1C: No results for input(s): "HGBA1C" in the last 72 hours. CBG: No results for input(s): "GLUCAP" in the last 168 hours. Lipid Profile: No results for input(s): "CHOL", "HDL", "LDLCALC", "TRIG", "CHOLHDL", "LDLDIRECT" in the last 72 hours. Thyroid Function Tests: No results for input(s): "TSH", "T4TOTAL", "FREET4", "T3FREE", "THYROIDAB" in the last 72 hours. Anemia Panel: No results for input(s): "VITAMINB12", "FOLATE", "FERRITIN", "TIBC", "IRON", "RETICCTPCT" in the last 72 hours. Sepsis Labs: No results for input(s): "PROCALCITON", "LATICACIDVEN" in the last 168 hours.   Recent Results (from the past 240 hours)  Culture, blood (routine x 2)     Status: None   Collection Time: 09/01/23 12:25 PM   Specimen: BLOOD LEFT ARM  Result  Value Ref Range Status   Specimen Description   Final    BLOOD LEFT ARM Performed at [facility], [address] [place], [place] [address]    Special Requests   Final    BOTTLES DRAWN AEROBIC AND ANAEROBIC Blood Culture adequate volume Performed at [facility], [address], [place], [place] [address]    Culture   Final    NO GROWTH 5 DAYS Performed at [facility], [address], [place], [place] [address]    Report Status 09/06/2023 FINAL  Final  Culture, blood (routine x 2)     Status: None   Collection Time: 09/01/23  1:18 PM   Specimen: BLOOD RIGHT FOREARM  Result Value Ref Range Status   Specimen Description   Final    BLOOD RIGHT FOREARM Performed at [facility], [address], [place], [place] [address]    Special Requests   Final    BOTTLES DRAWN AEROBIC AND ANAEROBIC Blood Culture adequate volume Performed at [facility], [address] [place], [place] [address]    Culture   Final    NO GROWTH 5 DAYS Performed at [facility], [address] [place], [place] [address]    Report Status 09/06/2023 FINAL  Final  Resp panel by RT-PCR (RSV, Flu A&B,  Covid) Anterior Nasal Swab     Status: None   Collection Time: 09/01/23  1:18 PM   Specimen: Anterior Nasal Swab  Result Value Ref Range Status   SARS Coronavirus 2 by RT PCR NEGATIVE NEGATIVE Final    Comment: (NOTE) SARS-CoV-2 target nucleic acids are NOT DETECTED.  The SARS-CoV-2 RNA is generally detectable in upper respiratory specimens during the acute phase of infection. The lowest concentration of SARS-CoV-2 viral copies this assay can detect is 138 copies/mL. A negative result does not preclude SARS-Cov-2 infection and should not be used as the sole basis for treatment or other patient management decisions. A negative result may occur with  improper specimen collection/handling, submission of specimen other than nasopharyngeal swab, presence of viral mutation(s) within the areas targeted by this assay, and inadequate number of viral copies(<138 copies/mL). A negative result must be combined with clinical observations, patient history, and epidemiological information. The expected result is Negative.  Fact Sheet for Patients:  BloggerCourse.com  Fact Sheet for Healthcare Providers:  SeriousBroker.it  This test is no t yet approved or cleared by the [place] FDA and  has been authorized for detection and/or diagnosis of SARS-CoV-2 by FDA under an Emergency Use Authorization (EUA). This EUA will remain  in effect (meaning this test can be used) for the duration of the COVID-19 declaration under Section 564(b)(1) of the Act, 21 U.S.C.section 360bbb-3(b)(1), unless the authorization is terminated  or revoked sooner.       Influenza A by PCR NEGATIVE NEGATIVE Final   Influenza B by PCR NEGATIVE NEGATIVE Final    Comment: (NOTE) The Xpert Xpress SARS-CoV-2/FLU/RSV plus assay is intended as an aid in the diagnosis of influenza from Nasopharyngeal swab specimens and should not be used as a sole basis for treatment. Nasal  washings and aspirates are unacceptable for Xpert Xpress SARS-CoV-2/FLU/RSV testing.  Fact Sheet for Patients: BloggerCourse.com  Fact Sheet for Healthcare Providers: SeriousBroker.it  This test is not yet approved or cleared by the [place] FDA and has been authorized for detection and/or diagnosis of SARS-CoV-2 by FDA under an Emergency Use Authorization (EUA). This  EUA will remain in effect (meaning this test can be used) for the duration of the COVID-19 declaration under Section 564(b)(1) of the Act, 21 U.S.C. section 360bbb-3(b)(1), unless the authorization is terminated or revoked.     Resp Syncytial Virus by PCR NEGATIVE NEGATIVE Final    Comment: (NOTE) Fact Sheet for Patients: BloggerCourse.com  Fact Sheet for Healthcare Providers: SeriousBroker.it  This test is not yet approved or cleared by the [place] FDA and has been authorized for detection and/or diagnosis of SARS-CoV-2 by FDA under an Emergency Use Authorization (EUA). This EUA will remain in effect (meaning this test can be used) for the duration of the COVID-19 declaration under Section 564(b)(1) of the Act, 21 U.S.C. section 360bbb-3(b)(1), unless the authorization is terminated or revoked.  Performed at [facility], [address], [place], [place] [address]   MRSA Next Gen by PCR, Nasal     Status: None   Collection Time: 09/02/23 11:45 AM   Specimen: Nasal Mucosa; Nasal Swab  Result Value Ref Range Status   MRSA by PCR Next Gen NOT DETECTED NOT DETECTED Final    Comment: (NOTE) The GeneXpert MRSA Assay (FDA approved for NASAL specimens only), is one component of a comprehensive MRSA colonization surveillance program. It is not intended to diagnose MRSA infection nor to guide or monitor treatment for MRSA infections. Test performance is not FDA approved in patients less  than 59 years old. Performed at [facility], [address] [place], [place] [address]          Radiology Studies: ECHOCARDIOGRAM COMPLETE Result Date: 09/08/2023    ECHOCARDIOGRAM REPORT   Patient Name:   Jordan [name] Date of Exam: 09/08/2023 Medical Rec #:  [id number]         Height:       73.0 in Accession #:    [id number]        Weight:       228.4 lb Date of Birth:  Aug 31, 1950         BSA:          2.277 m Patient Age:    72 years          BP:           116/69 mmHg Patient Gender: M                 HR:           102 bpm. Exam Location:  Inpatient Procedure: 2D Echo, Cardiac Doppler, Color Doppler and Intracardiac            Opacification Agent (Both Spectral and Color Flow Doppler were            utilized during procedure). Indications:    Abnormal ECG  History:        Patient has prior history of Echocardiogram examinations, most                 recent 07/14/2021. CAD, PAD and TIA, Arrythmias:Bradycardia; Risk                 Factors:Dyslipidemia.                 Aortic Valve: 26 mm Sapien prosthetic, stented (TAVR) valve is                 present in the aortic position.  Sonographer:    Vern Claude Referring Phys: [id number] Jon Billings A Gwin Eagon  Sonographer Comments: Image  acquisition challenging due to patient body habitus and Image acquisition challenging due to respiratory motion. IMPRESSIONS  1. Very poor study, even with Definity contrast - almost uninterpretable.  2. Left ventricular ejection fraction appears grossly normal - estimated at 60 to 65%. The left ventricle has normal function. The left ventricle has no regional wall motion abnormalities. Left ventricular diastolic parameters are indeterminate.  3. Right ventricular systolic function was not well visualized. The right ventricular size is not well visualized.  4. The mitral valve was not well visualized. No evidence of mitral valve regurgitation.  5. TAVR valve is not well-visualized. The aortic valve was not well  visualized. Aortic valve regurgitation is not visualized. There is a 26 mm Sapien prosthetic (TAVR) valve present in the aortic position. Comparison(s): Changes from prior study are noted. 07/14/2021: LVEF 55-60%, TAVR valve. FINDINGS  Left Ventricle: Left ventricular ejection fraction, by estimation, is 60 to 65%. The left ventricle has normal function. Left ventricular endocardial border not optimally defined to evaluate regional wall motion. Definity contrast agent was given IV to delineate the left ventricular endocardial borders. The left ventricular internal cavity size was normal in size. There is no left ventricular hypertrophy. Left ventricular diastolic parameters are indeterminate. Right Ventricle: The right ventricular size is not well visualized. Right vetricular wall thickness was not well visualized. Right ventricular systolic function was not well visualized. Left Atrium: Left atrial size was not well visualized. Right Atrium: Right atrial size was not well visualized. Pericardium: The pericardium was not well visualized. Mitral Valve: The mitral valve was not well visualized. No evidence of mitral valve regurgitation. MV peak gradient, 5.8 mmHg. The mean mitral valve gradient is 2.0 mmHg. Tricuspid Valve: The tricuspid valve is not well visualized. Tricuspid valve regurgitation is not demonstrated. Aortic Valve: TAVR valve is not well-visualized. The aortic valve was not well visualized. Aortic valve regurgitation is not visualized. Aortic valve mean gradient measures 2.0 mmHg. Aortic valve peak gradient measures 3.9 mmHg. Aortic valve area, by VTI  measures 2.12 cm. There is a 26 mm Sapien prosthetic, stented (TAVR) valve present in the aortic position. Pulmonic Valve: The pulmonic valve was not well visualized. Pulmonic valve regurgitation is not visualized. Aorta: The aortic root was not well visualized. IAS/Shunts: The interatrial septum was not well visualized.  LEFT VENTRICLE PLAX 2D LVIDd:          5.80 cm LVIDs:         4.10 cm LV PW:         0.80 cm LV IVS:        1.00 cm LVOT diam:     2.10 cm LV SV:         38 LV SV Index:   17 LVOT Area:     3.46 cm  LV Volumes (MOD) LV vol d, MOD A4C: 64.9 ml LV vol s, MOD A4C: 23.2 ml LV SV MOD A4C:     64.9 ml RIGHT VENTRICLE RV S prime:     8.49 cm/s LEFT ATRIUM           Index        RIGHT ATRIUM           Index LA diam:      4.50 cm 1.98 cm/m   RA Area:     12.80 cm LA Vol (A2C): 32.5 ml 14.28 ml/m  RA Volume:   30.40 ml  13.35 ml/m LA Vol (A4C): 89.7 ml 39.40 ml/m  AORTIC VALVE AV Area (  Vmax):    2.46 cm AV Area (Vmean):   2.18 cm AV Area (VTI):     2.12 cm AV Vmax:           99.10 cm/s AV Vmean:          64.600 cm/s AV VTI:            0.178 m AV Peak Grad:      3.9 mmHg AV Mean Grad:      2.0 mmHg LVOT Vmax:         70.50 cm/s LVOT Vmean:        40.600 cm/s LVOT VTI:          0.109 m LVOT/AV VTI ratio: 0.61  AORTA Ao Root diam: 3.70 cm MITRAL VALVE MV Area (PHT): 5.31 cm     SHUNTS MV Area VTI:   1.38 cm     Systemic VTI:  0.11 m MV Peak grad:  5.8 mmHg     Systemic Diam: 2.10 cm MV Mean grad:  2.0 mmHg MV Vmax:       1.20 m/s MV Vmean:      73.2 cm/s MV Decel Time: 143 msec MV E velocity: 120.00 cm/s Zoila Shutter MD Electronically signed by Zoila Shutter MD Signature Date/Time: 09/08/2023/4:59:33 PM    Final          Scheduled Meds:  atorvastatin  80 mg Oral QODAY   cephALEXin  500 mg Oral Q8H   doxycycline  100 mg Oral Q12H   ipratropium-albuterol  3 mL Nebulization BID   LORazepam  1 mg Oral BID   metolazone  5 mg Oral Daily   mometasone-formoterol  2 puff Inhalation BID   multivitamin with minerals  1 tablet Oral Q breakfast   pantoprazole  40 mg Oral Daily   venlafaxine XR  75 mg Oral Q breakfast   Continuous Infusions:  furosemide 120 mg (09/10/23 0601)   heparin 1,300 Units/hr (09/10/23 0603)     LOS: 9 days    Time spent: 35 minutes    Mitra Duling A Ryer Asato, MD [facility]   If 7PM-7AM, please contact  night-coverage [email or web address]  09/10/2023, 1:39 PM

## 2023-09-10 NOTE — Progress Notes (Signed)
 Critical Result  Date/time received: 09/10/23 1836  Test: pTT Critical Value: > 200  Name of Provider Notified: Hartley Barefoot, MD  New orders or actions?: Alert pharmacy, stop heparin gtt (per pharmacy).

## 2023-09-10 NOTE — Progress Notes (Signed)
 PHARMACY - ANTICOAGULATION CONSULT NOTE  Pharmacy Consult for  Eliquis --> heparin  Indication: hx atrial fibrillation  Allergies  Allergen Reactions   Altace [Ramipril] Other (See Comments)    Dizziness, migraine, weakness- "there is dye in it"   Codeine Itching   Metoprolol Tartrate Other (See Comments)    Dropped the B/P too low- eventually stopped by a MD   Naproxen Itching    Patient Measurements: Height: 6\' 1"  (185.4 cm) Weight: 105.3 kg (232 lb 2.3 oz) IBW/kg (Calculated) : 79.9 Heparin Dosing Weight: 81 kg  Vital Signs: Temp: 97.7 F (36.5 C) (03/23 0543) Temp Source: Oral (03/23 0543) BP: 126/68 (03/23 0543) Pulse Rate: 110 (03/23 0543)  Labs: Recent Labs    09/08/23 0803 09/08/23 1710 09/09/23 1022 09/09/23 1639 09/09/23 1839 09/10/23 0457 09/10/23 0503  HGB 7.6* 7.6*  --  7.5*  --   --  7.5*  HCT 27.2* 27.3*  --  26.5*  --   --  27.1*  PLT 259 274  --  279  --   --  294  APTT  --   --   --   --  48* 87*  --   HEPARINUNFRC  --   --   --   --  >1.10* >1.10*  --   CREATININE 2.46*  --  2.22*  --   --  2.22*  --     Estimated Creatinine Clearance: 37.8 mL/min (A) (by C-G formula based on SCr of 2.22 mg/dL (H)).   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 Excell Seltzer and Dr. Laneta Simmers   Severe aortic stenosis    Severe aortic insufficiency, moderate to severe aortic stenosis    Sleep apnea    no longer uses cpap   Venous insufficiency    managed by ortho (Dr. Lajoyce Corners)    Medications:  - on Eliquis 5 mg bid PTA   Assessment: Patient is a 73 y.o M with hx CKD, PVD, bilateral venous stasis disease and afib on Eliquis PTA who presented to the ED on 09/01/23 with worsening of left leg wound/pain sustained after an injury at the nursing facility. He was started on abx for wound infection and Eliquis resumed on admission. ABIs performed on 09/05/22, but they were unable to fully do ABI of the left leg due to pt's inability to keep still. Pharmacy has been consulted to change anticoag. to heparin drip on 09/09/23 in anticipation for angiography on 09/12/23 to evaluate blood flow to his left leg.   Today, 09/10/2023: aPTT 87 therapeutic on 1300 units/hr HL still elevated with apixaban on board HGb 7.5, plts 294 No complications of therapy noted  Goal of Therapy:  Heparin level 0.3-0.7 units/ml aPTT 66-102 seconds Monitor platelets  by anticoagulation protocol: Yes   Plan:  Continue heparin drip at 1300 units/hr Recheck 8 hr  aPTT Monitor for s/sx bleeding    Arley Phenix RPh 09/10/2023, 5:55 AM

## 2023-09-10 NOTE — Progress Notes (Addendum)
 [facility] Progress Note  Subjective:  UOP net neg 2.9 L yest Feeling less swollen  Vitals:   09/09/23 2053 09/09/23 2110 09/09/23 2128 09/10/23 0543  BP: 108/72   126/68  Pulse: 80   (!) 110  Resp: 18   18  Temp: 98.2 F (36.8 C)   97.7 F (36.5 C)  TempSrc: Oral   Oral  SpO2: (!) 85% 91% 99% 98%  Weight:      Height:        Exam: Gen alert, no distress, elderly male Sig deconditioning  No jvd or bruits Chest clear bilat to bases RRR no MRG Abd soft ntnd no mass or ascites +bs Ext still w/ 1- 2+ pretib/hip edema Neuro is alert, Ox 3 , nf      Renal-related home meds: Lasix 40 mg donating with 20 mg every other day Atenolol 25 mg daily Others: Protonix, Ultram, Singulair, Ativan, Eliquis, Effexor XR, Symbicort, statin, Zetia   Chest x-ray 3/14 showed bilateral atelectasis versus pneumonia, no edema CXR 3/19 - bilat pulm edema and effusions Renal ultrasound on 3/19 showed 8 to 10 cm kidneys no hydro   UA on 3/15 - moderate hemoglobin 6-10 RBCs 6-10 WBCs 0-5 epis no protein  ECHO 09/08/23 - LVEF 60-65%   Assessment/ Plan: AKI: b/l creat is 1.3, from PCP's office in Jan 2024. Assuming this is AKI but could also have CKD. UA shows some rbc's/ wbc's, no proteinuria. Renal US w/o obstruction. BP's were soft in 90s on presentation and then improved after IV abx and IVF's, but pt developed pulm edema on CXR #2 w/ bilat LE edema. Suspect CKD and/or ATN / vol overload as cause of renal failure. Started IV lasix 3/19, titrating up the dose and the UOP is improving. 4.4 L UOP yest and creat stable in low 2's. Cont diuresis.   Update: in the afternoon BP's dropped into 80s-90s. Will lower lasix to 80mg  tid IV and hold if SBP < 105.  Hold zaroxolyn for now.   Soft tissue infection: SP IV abx, is now on po keflex COPD Atrial fib S/p TAVR Chronic resp failure - on home O2 2 L HTN - bp's low, doesn't need atenolol now, will dc.     Vinson Moselle MD   [facility] 09/10/2023, 11:13 AM  Recent Labs  Lab 09/09/23 1022 09/09/23 1639 09/10/23 0457 09/10/23 0503  HGB  --  7.5*  --  7.5*  ALBUMIN 2.0*  --  2.1*  --   CALCIUM 7.9*  --  8.0*  --   PHOS 4.6  --  4.7*  --   CREATININE 2.22*  --  2.22*  --   K 3.9  --  3.8  --    No results for input(s): "IRON", "TIBC", "FERRITIN" in the last 168 hours.  Inpatient medications:  atorvastatin  80 mg Oral QODAY   cephALEXin  500 mg Oral Q8H   doxycycline  100 mg Oral Q12H   ipratropium-albuterol  3 mL Nebulization BID   LORazepam  1 mg Oral BID   metolazone  5 mg Oral Daily   mometasone-formoterol  2 puff Inhalation BID   multivitamin with minerals  1 tablet Oral Q breakfast   pantoprazole  40 mg Oral Daily   venlafaxine XR  75 mg Oral Q breakfast    furosemide 120 mg (09/10/23 0601)   heparin 1,300 Units/hr (09/10/23 0603)   acetaminophen **OR** acetaminophen, albuterol, alum & mag hydroxide-simeth, melatonin, ondansetron (ZOFRAN) IV, oxyCODONE,  polyethylene glycol, traMADol

## 2023-09-10 NOTE — Progress Notes (Signed)
   09/10/23 1336  Assess: MEWS Score  Temp 98 F (36.7 C)  BP 95/63  MAP (mmHg) 73  Pulse Rate (!) 102 (notify to the nurse.)  Resp 18  SpO2 99 %  O2 Device Nasal Cannula  Assess: MEWS Score  MEWS Temp 0  MEWS Systolic 1  MEWS Pulse 1  MEWS RR 0  MEWS LOC 0  MEWS Score 2  MEWS Score Color Yellow  Assess: if the MEWS score is Yellow or Red  Were vital signs accurate and taken at a resting state? Yes  Does the patient meet 2 or more of the SIRS criteria? No  MEWS guidelines implemented  Yes, yellow  Treat  MEWS Interventions Considered administering scheduled or prn medications/treatments as ordered  Take Vital Signs  Increase Vital Sign Frequency  Yellow: Q2hr x1, continue Q4hrs until patient remains green for 12hrs  Escalate  MEWS: Escalate Yellow: Discuss with charge nurse and consider notifying provider and/or RRT  Notify: Charge Nurse/RN  Name of Charge Nurse/RN Notified Alda Berthold, RN  Provider Notification  Provider Name/Title Hartley Barefoot, MD  Date Provider Notified 09/10/23  Time Provider Notified 1437  Method of Notification Page  Notification Reason Other (Comment) (hypotensive)  Provider response Evaluate remotely;See new orders  Date of Provider Response 09/10/23  Time of Provider Response 1440  Assess: SIRS CRITERIA  SIRS Temperature  0  SIRS Respirations  0  SIRS Pulse 1  SIRS WBC 0  SIRS Score Sum  1

## 2023-09-10 NOTE — Plan of Care (Signed)
   Problem: Education: Goal: Knowledge of General Education information will improve Description Including pain rating scale, medication(s)/side effects and non-pharmacologic comfort measures Outcome: Progressing   Problem: Health Behavior/Discharge Planning: Goal: Ability to manage health-related needs will improve Outcome: Progressing

## 2023-09-10 NOTE — Plan of Care (Signed)

## 2023-09-10 NOTE — Progress Notes (Addendum)
 PHARMACY - ANTICOAGULATION CONSULT NOTE  Pharmacy Consult for  Eliquis --> heparin  Indication: hx atrial fibrillation  Allergies  Allergen Reactions   Altace [Ramipril] Other (See Comments)    Dizziness, migraine, weakness- "there is dye in it"   Codeine Itching   Metoprolol Tartrate Other (See Comments)    Dropped the B/P too low- eventually stopped by a MD   Naproxen Itching    Patient Measurements: Height: 6\' 1"  (185.4 cm) Weight: 105.3 kg (232 lb 2.3 oz) IBW/kg (Calculated) : 79.9 Heparin Dosing Weight: 81 kg  Vital Signs: Temp: 97.8 F (36.6 C) (03/23 1853) Temp Source: Oral (03/23 1853) BP: 93/53 (03/23 1853) Pulse Rate: 95 (03/23 1853)  Labs: Recent Labs    09/08/23 0803 09/08/23 1710 09/09/23 1022 09/09/23 1639 09/09/23 1839 09/09/23 1839 09/10/23 0457 09/10/23 0503 09/10/23 1230 09/10/23 1540  HGB 7.6* 7.6*  --  7.5*  --   --   --  7.5*  --   --   HCT 27.2* 27.3*  --  26.5*  --   --   --  27.1*  --   --   PLT 259 274  --  279  --   --   --  294  --   --   APTT  --   --   --   --  48*   < > 87*  --  153* >200*  HEPARINUNFRC  --   --   --   --  >1.10*  --  >1.10*  --   --   --   CREATININE 2.46*  --  2.22*  --   --   --  2.22*  --   --   --    < > = values in this interval not displayed.    Estimated Creatinine Clearance: 37.8 mL/min (A) (by C-G formula based on SCr of 2.22 mg/dL (H)).   Medications:  - on Eliquis 5 mg bid PTA   Assessment: Patient is a 73 y.o M with hx CKD, PVD, bilateral venous stasis disease and afib on Eliquis PTA who presented to the ED on 09/01/23 with worsening of left leg wound/pain sustained after an injury at the nursing facility. He was started on abx for wound infection and Eliquis resumed on admission. ABIs performed on 09/05/22, but they were unable to fully do ABI of the left leg due to pt's inability to keep still. Pharmacy has been consulted to change anticoag to heparin drip on 09/09/23 in anticipation for angiography on  09/12/23 to evaluate blood flow to his left leg.   Today, 09/10/2023: aPTT now grossly supratherapeutic on 1300 units/hr, despite previously being WNL on same rate with stable renal function Initial aPTT 153; unable to confirm blood draw site w/ phlebotomist, but per RN, pt has a bandage on each AC with heparin infusing in the right hand, making downstream draw a possibility Repeated aPTT draw with instructions on lab order to draw from L arm (but was again unable to reach phlebotomist by phone to verbally convey this instruction) Repeat aPTT > 200 sec. Called lab to confirm draw site, but phlebotomist who Cristin Szatkowski sample had already left for the day. RN did not witness blood draw, and reports patient had same bandages on each AC from prior inspection Confirmed infusion rate at 13 ml/hr each time with RN Heparin turned off at 18:45 HL still elevated with apixaban on board Hgb low but stable; Plt stable WNL No bleeding per RN  Goal of Therapy:  Heparin level 0.3-0.7 units/ml aPTT 66-102 seconds Monitor platelets by anticoagulation protocol: Yes   Plan:  Stop heparin infusion x 1 hr Resume heparin at 800 units/hr starting at 20:00 tonight Recheck 8 hr aPTT; will also add SCr to confirm no abrupt changes in renal function Monitor for s/sx bleeding    Bernadene Person, PharmD, BCPS [phone] 09/10/2023, 7:21 PM

## 2023-09-11 DIAGNOSIS — L089 Local infection of the skin and subcutaneous tissue, unspecified: Secondary | ICD-10-CM | POA: Diagnosis not present

## 2023-09-11 LAB — APTT
aPTT: 99 s — ABNORMAL HIGH (ref 24–36)
aPTT: 71 s — ABNORMAL HIGH (ref 24–36)

## 2023-09-11 LAB — RENAL FUNCTION PANEL
Albumin: 2.8 g/dL — ABNORMAL LOW (ref 3.5–5.0)
Anion gap: 11 (ref 5–15)
BUN: 62 mg/dL — ABNORMAL HIGH (ref 8–23)
CO2: 29 mmol/L (ref 22–32)
Calcium: 8.3 mg/dL — ABNORMAL LOW (ref 8.9–10.3)
Chloride: 99 mmol/L (ref 98–111)
Creatinine, Ser: 2.28 mg/dL — ABNORMAL HIGH (ref 0.61–1.24)
GFR, Estimated: 30 mL/min — ABNORMAL LOW (ref >60)
Glucose, Bld: 88 mg/dL (ref 70–99)
Phosphorus: 4.9 mg/dL — ABNORMAL HIGH (ref 2.5–4.6)
Potassium: 3.7 mmol/L (ref 3.5–5.1)
Sodium: 139 mmol/L (ref 135–145)

## 2023-09-11 LAB — PROTEIN ELECTROPHORESIS, SERUM
A/G Ratio: 0.6 — ABNORMAL LOW (ref 0.7–1.7)
Albumin ELP: 2.2 g/dL — ABNORMAL LOW (ref 2.9–4.4)
Alpha-1-Globulin: 0.4 g/dL (ref 0.0–0.4)
Alpha-2-Globulin: 0.4 g/dL (ref 0.4–1.0)
Beta Globulin: 1.2 g/dL (ref 0.7–1.3)
Gamma Globulin: 1.5 g/dL (ref 0.4–1.8)
Globulin, Total: 3.5 g/dL (ref 2.2–3.9)
Total Protein ELP: 5.7 g/dL — ABNORMAL LOW (ref 6.0–8.5)

## 2023-09-11 LAB — CBC
HCT: 23 % — ABNORMAL LOW (ref 39.0–52.0)
Hemoglobin: 6.7 g/dL — CL (ref 13.0–17.0)
MCH: 22.1 pg — ABNORMAL LOW (ref 26.0–34.0)
MCHC: 29.1 g/dL — ABNORMAL LOW (ref 30.0–36.0)
MCV: 75.9 fL — ABNORMAL LOW (ref 80.0–100.0)
Platelets: 353 10*3/uL (ref 150–400)
RBC: 3.03 MIL/uL — ABNORMAL LOW (ref 4.22–5.81)
RDW: 22.6 % — ABNORMAL HIGH (ref 11.5–15.5)
WBC: 8 10*3/uL (ref 4.0–10.5)
nRBC: 0 % (ref 0.0–0.2)

## 2023-09-11 LAB — HEMOGLOBIN AND HEMATOCRIT, BLOOD
HCT: 25.9 % — ABNORMAL LOW (ref 39.0–52.0)
Hemoglobin: 7.7 g/dL — ABNORMAL LOW (ref 13.0–17.0)

## 2023-09-11 LAB — PREPARE RBC (CROSSMATCH)

## 2023-09-11 MED ORDER — SODIUM CHLORIDE 0.9% IV SOLUTION: Freq: Once | INTRAVENOUS | Status: DC

## 2023-09-11 NOTE — Progress Notes (Signed)
 Patient ID: Jordan [name], male   DOB: [birth year], 73 y.o.   MRN: [id number] S: No new complaints. O:BP 109/71 (BP Location: Right Arm)   Pulse 98   Temp 98.5 F (36.9 C) (Oral)   Resp 16   Ht 6\' 1"  (1.854 m)   Wt 105.3 kg   SpO2 98%   BMI 30.63 kg/m   Intake/Output Summary (Last 24 hours) at 09/11/2023 1344 Last data filed at 09/11/2023 1209 Gross per 24 hour  Intake 1195.04 ml  Output 2050 ml  Net -854.96 ml   Intake/Output: I/O last 3 completed shifts: In: 1765.5 [P.O.:1080; I.V.:391.3; IV Piggyback:294.1] Out: 4900 [Urine:4900]  Intake/Output this shift:  Total I/O In: 470 [P.O.:120; Blood:350] Out: 450 [Urine:450] Weight change:  Gen: NAD CVS: RRR Resp:CTA Abd: +BS, soft, NT/ND Ext: trace pretibial edema  Recent Labs  Lab 09/05/23 0444 09/06/23 0458 09/07/23 0454 09/08/23 0803 09/09/23 1022 09/10/23 0457 09/11/23 0419  NA 135 132* 132* 135 135 137 139  K 4.1 4.5 4.6 4.4 3.9 3.8 3.7  CL 105 104 103 104 104 101 99  CO2 22 21* 21* 21* 23 26 29   GLUCOSE 96 89 90 88 155* 91 88  BUN 43* 39* 58* 59* 60* 57* 62*  CREATININE 2.50* 2.69* 2.61* 2.46* 2.22* 2.22* 2.28*  ALBUMIN  --  2.0*  --   --  2.0* 2.1* 2.8*  CALCIUM 7.9* 7.7* 7.6* 7.8* 7.9* 8.0* 8.3*  PHOS  --   --   --   --  4.6 4.7* 4.9*  AST  --  24  --   --   --   --   --   ALT  --  16  --   --   --   --   --    Liver Function Tests: Recent Labs  Lab 09/06/23 0458 09/09/23 1022 09/10/23 0457 09/11/23 0419  AST 24  --   --   --   ALT 16  --   --   --   ALKPHOS 128*  --   --   --   BILITOT 0.6  --   --   --   PROT 6.0*  --   --   --   ALBUMIN 2.0* 2.0* 2.1* 2.8*   No results for input(s): "LIPASE", "AMYLASE" in the last 168 hours. No results for input(s): "AMMONIA" in the last 168 hours. CBC: Recent Labs  Lab 09/08/23 0803 09/08/23 1710 09/09/23 1639 09/10/23 0503 09/11/23 0419  WBC 7.9 9.7 8.7 8.6 8.0  HGB 7.6* 7.6* 7.5* 7.5* 6.7*  HCT 27.2* 27.3* 26.5* 27.1* 23.0*  MCV 77.9* 77.8*  76.8* 78.3* 75.9*  PLT 259 274 279 294 353   Cardiac Enzymes: No results for input(s): "CKTOTAL", "CKMB", "CKMBINDEX", "TROPONINI" in the last 168 hours. CBG: No results for input(s): "GLUCAP" in the last 168 hours.  Iron Studies: No results for input(s): "IRON", "TIBC", "TRANSFERRIN", "FERRITIN" in the last 72 hours. Studies/Results: No results found.  sodium chloride   Intravenous Once   atorvastatin  80 mg Oral QODAY   doxycycline  100 mg Oral Q12H   furosemide  80 mg Intravenous Q8H   ipratropium-albuterol  3 mL Nebulization BID   LORazepam  1 mg Oral BID   mometasone-formoterol  2 puff Inhalation BID   multivitamin with minerals  1 tablet Oral Q breakfast   pantoprazole  40 mg Oral Daily   venlafaxine XR  75 mg Oral Q breakfast    BMET  Component Value Date/Time   NA 139 09/11/2023 0419   NA 139 07/02/2021 1322   K 3.7 09/11/2023 0419   CL 99 09/11/2023 0419   CO2 29 09/11/2023 0419   GLUCOSE 88 09/11/2023 0419   BUN 62 (H) 09/11/2023 0419   BUN 26 07/02/2021 1322   CREATININE 2.28 (H) 09/11/2023 0419   CALCIUM 8.3 (L) 09/11/2023 0419   GFRNONAA 30 (L) 09/11/2023 0419   GFRAA 65 07/23/2020 1533   CBC    Component Value Date/Time   WBC 8.0 09/11/2023 0419   RBC 3.03 (L) 09/11/2023 0419   HGB 6.7 (LL) 09/11/2023 0419   HGB 11.0 (L) 07/02/2021 1322   HCT 23.0 (L) 09/11/2023 0419   HCT 35.0 (L) 07/02/2021 1322   PLT 353 09/11/2023 0419   PLT 266 07/02/2021 1322   MCV 75.9 (L) 09/11/2023 0419   MCV 81 07/02/2021 1322   MCH 22.1 (L) 09/11/2023 0419   MCHC 29.1 (L) 09/11/2023 0419   RDW 22.6 (H) 09/11/2023 0419   RDW 15.3 07/02/2021 1322   LYMPHSABS 1.2 09/04/2023 0407   MONOABS 0.8 09/04/2023 0407   EOSABS 0.4 09/04/2023 0407   BASOSABS 0.0 09/04/2023 0407    Assessment/ Plan: AKI: b/l creat is 1.3, from PCP's office in Jan 2024. Assuming this is AKI but could also have CKD. UA shows some rbc's/ wbc's, no proteinuria. Renal US w/o obstruction. BP's were  soft in 90s on presentation and then improved after IV abx and IVF's, but pt developed pulm edema on CXR #2 w/ bilat LE edema. Suspect CKD and/or ATN / vol overload as cause of renal failure. Started IV lasix 3/19, titrating up the dose and the UOP is improving. 4.4 L UOP yest and creat stable in low 2's. Cont diuresis. He dropped his Bp yesterday afternoon so Lasix was decreased to 80 mg IV tid.  Zaroxolyn on hold.  Bp was low again today will hold evening dose of lasix for now and follow.   Soft tissue infection: SP IV abx, is now on po keflex Anemia - hgb down to 6.7.  to receive blood transfusion today.  COPD Atrial fib S/p TAVR Chronic resp failure - on home O2 2 L HTN - bp's low, doesn't need atenolol now, will dc.   Irena Cords, MD [facility]

## 2023-09-11 NOTE — Progress Notes (Signed)
 NP Garner Nash was notified that patient IV site that was infusing heparin gtt started to bleed a large amount, his gown and linens had blood. Blood was squirting from the site and I held pressure for about 15-20 minutes and stopped heparin gtt at 2030. Bleeding stopped and pressure dressing was applied. Pharmacy was also notified of bleeding and advised to call whenever heparin gtt is restarted.

## 2023-09-11 NOTE — Plan of Care (Signed)
   Problem: Education: Goal: Knowledge of General Education information will improve Description Including pain rating scale, medication(s)/side effects and non-pharmacologic comfort measures Outcome: Progressing   Problem: Health Behavior/Discharge Planning: Goal: Ability to manage health-related needs will improve Outcome: Progressing

## 2023-09-11 NOTE — Progress Notes (Signed)
 NP Garner Nash was messaged again to follow up with critical hemoglobin of 6.7

## 2023-09-11 NOTE — Progress Notes (Signed)
 PHARMACY - ANTICOAGULATION CONSULT NOTE  Pharmacy Consult for  Eliquis --> heparin  Indication: hx atrial fibrillation  Allergies  Allergen Reactions   Altace [Ramipril] Other (See Comments)    Dizziness, migraine, weakness- "there is dye in it"   Codeine Itching   Metoprolol Tartrate Other (See Comments)    Dropped the B/P too low- eventually stopped by a MD   Naproxen Itching    Patient Measurements: Height: 6\' 1"  (185.4 cm) Weight: 105.3 kg (232 lb 2.3 oz) IBW/kg (Calculated) : 79.9 Heparin Dosing Weight: 81 kg  Vital Signs: Temp: 97.7 F (36.5 C) (03/24 0206) Temp Source: Oral (03/24 0206) BP: 107/57 (03/24 0206) Pulse Rate: 107 (03/24 0206)  Labs: Recent Labs    09/08/23 0803 09/08/23 1710 09/09/23 1022 09/09/23 1639 09/09/23 1839 09/09/23 1839 09/10/23 0457 09/10/23 0503 09/10/23 1230 09/10/23 1540 09/11/23 0419  HGB 7.6*   < >  --  7.5*  --   --   --  7.5*  --   --  6.7*  HCT 27.2*   < >  --  26.5*  --   --   --  27.1*  --   --  23.0*  PLT 259   < >  --  279  --   --   --  294  --   --  353  APTT  --   --   --   --  48*   < > 87*  --  153* >200* 99*  HEPARINUNFRC  --   --   --   --  >1.10*  --  >1.10*  --   --   --   --   CREATININE 2.46*  --  2.22*  --   --   --  2.22*  --   --   --   --    < > = values in this interval not displayed.    Estimated Creatinine Clearance: 37.8 mL/min (A) (by C-G formula based on SCr of 2.22 mg/dL (H)).   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 Excell Seltzer and Dr. Laneta Simmers   Severe aortic stenosis    Severe aortic insufficiency, moderate to severe aortic stenosis   Sleep apnea    no longer uses cpap   Venous insufficiency    managed by ortho (Dr. Lajoyce Corners)    Medications:  - on Eliquis 5 mg bid PTA   Assessment: Patient is a 73 y.o M with hx CKD, PVD, bilateral venous stasis disease and afib on Eliquis PTA who presented to the ED on 09/01/23 with worsening of left leg wound/pain sustained after an injury at the nursing facility. He was started on abx for wound infection and Eliquis resumed on admission.  ABIs performed on 09/05/22, but they were unable to fully do ABI of the left leg due to pt's inability to keep still. Pharmacy has been consulted to change anticoag. to heparin drip on 09/09/23 in anticipation for angiography on 09/12/23 to evaluate blood flow to his left leg.   Today, 09/11/2023: aPTT 99 therapeutic on 800 units/hr HGb 6.7, plts 353 No complications of therapy noted  Goal of Therapy:  Heparin level 0.3-0.7 units/ml aPTT 66-102 seconds Monitor platelets by anticoagulation protocol: Yes   Plan:  Continue heparin drip at 800 units/hr Recheck 8 hr  aPTT Monitor for s/sx bleeding    Arley Phenix RPh 09/11/2023, 5:00 AM

## 2023-09-11 NOTE — Progress Notes (Signed)
 PHARMACY - ANTICOAGULATION CONSULT NOTE  Pharmacy Consult for  Eliquis --> heparin  Indication: hx atrial fibrillation  Allergies  Allergen Reactions   Altace [Ramipril] Other (See Comments)    Dizziness, migraine, weakness- "there is dye in it"   Codeine Itching   Metoprolol Tartrate Other (See Comments)    Dropped the B/P too low- eventually stopped by a MD   Naproxen Itching    Patient Measurements: Height: 6\' 1"  (185.4 cm) Weight: 105.3 kg (232 lb 2.3 oz) IBW/kg (Calculated) : 79.9 Heparin Dosing Weight: 81 kg  Vital Signs: Temp: 97.7 F (36.5 C) (03/24 1401) Temp Source: Oral (03/24 1401) BP: 111/44 (03/24 1401) Pulse Rate: 117 (03/24 1401)  Labs: Recent Labs    09/09/23 1022 09/09/23 1639 09/09/23 1839 09/09/23 1839 09/10/23 0457 09/10/23 0503 09/10/23 1230 09/10/23 1540 09/11/23 0419 09/11/23 1245  HGB  --  7.5*  --   --   --  7.5*  --   --  6.7*  --   HCT  --  26.5*  --   --   --  27.1*  --   --  23.0*  --   PLT  --  279  --   --   --  294  --   --  353  --   APTT  --   --  48*   < > 87*  --    < > >200* 99* 71*  HEPARINUNFRC  --   --  >1.10*  --  >1.10*  --   --   --   --   --   CREATININE 2.22*  --   --   --  2.22*  --   --   --  2.28*  --    < > = values in this interval not displayed.    Estimated Creatinine Clearance: 36.8 mL/min (A) (by C-G formula based on SCr of 2.28 mg/dL (H)).  Medications:  - on Eliquis 5 mg bid PTA   Assessment: Patient is a 73 y.o M with hx CKD, PVD, bilateral venous stasis disease and afib on Eliquis PTA who presented to the ED on 09/01/23 with worsening of left leg wound/pain sustained after an injury at the nursing facility. He was started on abx for wound infection and Eliquis resumed on admission. ABIs performed on 09/05/22, but they were unable to fully do ABI of the left leg due to pt's inability to keep still. Pharmacy has been consulted to change anticoag. to heparin drip on 09/09/23 in anticipation for angiography on  09/12/23 to evaluate blood flow to his left leg.   Today, 09/11/2023: Confirmatory aPTT 71 seconds> remains therapeutic on 800 units/hr Hg 6.7- 1 unit PRBCs transfused, plts 353 No complications of therapy noted  Goal of Therapy:  Heparin level 0.3-0.7 units/ml aPTT 66-102 seconds Monitor platelets by anticoagulation protocol: Yes   Plan:  Continue heparin drip at 800 units/hr Daily aPTT & CBC Monitor for s/sx bleeding  Abdominal aortogram scheduled for 3/25 at 12noon @ [facility]>> f/u peri-procedural orders pertaining to heparin drip   Herby Abraham, Pharm.D Use secure chat for questions 09/11/2023 2:20 PM

## 2023-09-11 NOTE — Plan of Care (Signed)
   Problem: Education: Goal: Knowledge of General Education information will improve Description: Including pain rating scale, medication(s)/side effects and non-pharmacologic comfort measures Outcome: Progressing   Problem: Nutrition: Goal: Adequate nutrition will be maintained Outcome: Progressing

## 2023-09-11 NOTE — Progress Notes (Signed)
 NP Garner Nash was notified of critical lab: hemoglobin is 6.7, no signs of external bleeding noticed.

## 2023-09-11 NOTE — Progress Notes (Signed)
 PROGRESS NOTE    Jordan [name]  OZH:[id number] DOB: [birth year] DOA: 09/01/2023 PCP: Kennith Center, NP   Brief Narrative: 73 year old with past medical history significant for hypertension, heart failure preserved ejection fraction, A-fib, CAD, COPD with chronic hypoxic respiratory failure on 2 L of oxygen, chronic venous stasis, PVD admitted 3/17 for worsening cellulitis failing outpatient antibiotics, after injury at nursing home previous week.   Assessment & Plan:   Principal Problem:   Soft tissue infection   1-Soft tissue infection, LE infection, ulcers.  Chronic venous stasis disease PVD -ABI: Right: Resting right ankle-brachial index is within normal range. Unable to obtain TBI due to great toe ulceration.  Left:  Unable to obtain ABI due to patient pain tolerance and movement.  Monophasic waveforms are detected in the dorsalis pedis and posterior  tibial arteries.  Unable to obtain TBI due to great toe ulceration.  -Continue local care.  -Completed 10 days Keflex, Doxy for 5 days.  -Vascular consulted. Plan for arteriogram on Tuesday, sent message to Vascular.  -He has been on Heparin since Saturday.  Redness less intense.   AKI, on ? CKD -Previous cr 0.9 (2023) -Creatinine peak  at 2.6 -Renal ultrasound obtained negative for hydronephrosis -Nephrology consulted, managing diuresis.  -3/19:Chest x-ray with pulmonary edema -Cr [place]  2.2 -3/21: Lasix increased to 120 mg Q 8 Hours.  -Lasix reduce to 80 mg IV once due to soft BP> Received albumin 3/23.  Hypokalemia: Resolved  Hypertension://Hypotension.  Holding  atenolol to avoid hypotension Had soft BP, lasix dose reduce.   A-fib: Plan to hold eliquis tomorrow, for anticipation arteriogram on Tuesday. Heparin started 3/21.  Macrocytic anemia: Continue to monitor hemoglobin, transfuse for hemoglobin less than 7   COPD Chronic hypoxic respiratory failure on 2 L of oxygen 3/19: Wheezing, started scheduled  albuterol  Anxiety depression: Continue Effexor and Ativan  Anemia; iron deficiency:  Hb at 7.5. Received  IV iron 3/23. Hb down to 6. Getting one unit PRBC>  No evidence of active bleeding.    Pressure Injury 09/02/23 Heel Left Unstageable - Full thickness tissue loss in which the base of the injury is covered by slough (yellow, tan, gray, green or brown) and/or eschar (tan, brown or black) in the wound bed. (Active)  09/02/23 1713  Location: Heel  Location Orientation: Left  Staging: Unstageable - Full thickness tissue loss in which the base of the injury is covered by slough (yellow, tan, gray, green or brown) and/or eschar (tan, brown or black) in the wound bed.  Wound Description (Comments):   Present on Admission: Yes  Dressing Type Foam - Lift dressing to assess site every shift 09/11/23 0842     Estimated body mass index is 30.63 kg/m as calculated from the following:   Height as of this encounter: 6\' 1"  (1.854 m).   Weight as of this encounter: 105.3 kg.   DVT prophylaxis: Eliquis Code Status: Full code Family Communication: Discussed with patient Disposition Plan:  Status is: Inpatient Remains inpatient appropriate because: Management of AKI and lower extremity wounds    Consultants:  Nephrology Vascular  Procedures:  Renal US  Antimicrobials:    Subjective: He denies dyspnea.  LE edema improved. Report itching in his legs.     Objective: Vitals:   09/11/23 0929 09/11/23 1113 09/11/23 1205 09/11/23 1401  BP: (!) 95/48 (!) 99/54 109/71 (!) 111/44  Pulse: (!) 124 (!) 125 98 (!) 117  Resp: 19 19 16 16   Temp: 97.6 F (  36.4 C) 98 F (36.7 C) 98.5 F (36.9 C) 97.7 F (36.5 C)  TempSrc: Oral Oral Oral Oral  SpO2: 98% 100% 98% 94%  Weight:      Height:        Intake/Output Summary (Last 24 hours) at 09/11/2023 1434 Last data filed at 09/11/2023 1403 Gross per 24 hour  Intake 1265.21 ml  Output 2450 ml  Net -1184.79 ml   Filed Weights    09/06/23 1942 09/07/23 0500 09/09/23 0500  Weight: 102.3 kg 103.6 kg 105.3 kg    Examination:  General exam: NAD Respiratory system: BL  wheezing.  Cardiovascular system: S 1, S 2 RRR Gastrointestinal system:BS present, soft, nt Central nervous system: alert, follows command Extremities: BL LE ulceration, toe with ulceration LE with less intense  redness.    Data Reviewed: I have personally reviewed following labs and imaging studies  CBC: Recent Labs  Lab 09/08/23 0803 09/08/23 1710 09/09/23 1639 09/10/23 0503 09/11/23 0419  WBC 7.9 9.7 8.7 8.6 8.0  HGB 7.6* 7.6* 7.5* 7.5* 6.7*  HCT 27.2* 27.3* 26.5* 27.1* 23.0*  MCV 77.9* 77.8* 76.8* 78.3* 75.9*  PLT 259 274 279 294 353   Basic Metabolic Panel: Recent Labs  Lab 09/07/23 0454 09/08/23 0803 09/09/23 1022 09/10/23 0457 09/11/23 0419  NA 132* 135 135 137 139  K 4.6 4.4 3.9 3.8 3.7  CL 103 104 104 101 99  CO2 21* 21* 23 26 29   GLUCOSE 90 88 155* 91 88  BUN 58* 59* 60* 57* 62*  CREATININE 2.61* 2.46* 2.22* 2.22* 2.28*  CALCIUM 7.6* 7.8* 7.9* 8.0* 8.3*  PHOS  --   --  4.6 4.7* 4.9*   GFR: Estimated Creatinine Clearance: 36.8 mL/min (A) (by C-G formula based on SCr of 2.28 mg/dL (H)). Liver Function Tests: Recent Labs  Lab 09/06/23 0458 09/09/23 1022 09/10/23 0457 09/11/23 0419  AST 24  --   --   --   ALT 16  --   --   --   ALKPHOS 128*  --   --   --   BILITOT 0.6  --   --   --   PROT 6.0*  --   --   --   ALBUMIN 2.0* 2.0* 2.1* 2.8*   No results for input(s): "LIPASE", "AMYLASE" in the last 168 hours. No results for input(s): "AMMONIA" in the last 168 hours. Coagulation Profile: No results for input(s): "INR", "PROTIME" in the last 168 hours. Cardiac Enzymes: No results for input(s): "CKTOTAL", "CKMB", "CKMBINDEX", "TROPONINI" in the last 168 hours. BNP (last 3 results) No results for input(s): "PROBNP" in the last 8760 hours. HbA1C: No results for input(s): "HGBA1C" in the last 72 hours. CBG: No  results for input(s): "GLUCAP" in the last 168 hours. Lipid Profile: No results for input(s): "CHOL", "HDL", "LDLCALC", "TRIG", "CHOLHDL", "LDLDIRECT" in the last 72 hours. Thyroid Function Tests: No results for input(s): "TSH", "T4TOTAL", "FREET4", "T3FREE", "THYROIDAB" in the last 72 hours. Anemia Panel: No results for input(s): "VITAMINB12", "FOLATE", "FERRITIN", "TIBC", "IRON", "RETICCTPCT" in the last 72 hours. Sepsis Labs: No results for input(s): "PROCALCITON", "LATICACIDVEN" in the last 168 hours.   Recent Results (from the past 240 hours)  MRSA Next Gen by PCR, Nasal     Status: None   Collection Time: 09/02/23 11:45 AM   Specimen: Nasal Mucosa; Nasal Swab  Result Value Ref Range Status   MRSA by PCR Next Gen NOT DETECTED NOT DETECTED Final    Comment: (NOTE) The  GeneXpert MRSA Assay (FDA approved for NASAL specimens only), is one component of a comprehensive MRSA colonization surveillance program. It is not intended to diagnose MRSA infection nor to guide or monitor treatment for MRSA infections. Test performance is not FDA approved in patients less than 88 years old. Performed at [facility], [address], [place], [place] [address]          Radiology Studies: No results found.        Scheduled Meds:  sodium chloride   Intravenous Once   atorvastatin  80 mg Oral QODAY   doxycycline  100 mg Oral Q12H   ipratropium-albuterol  3 mL Nebulization BID   LORazepam  1 mg Oral BID   mometasone-formoterol  2 puff Inhalation BID   multivitamin with minerals  1 tablet Oral Q breakfast   pantoprazole  40 mg Oral Daily   venlafaxine XR  75 mg Oral Q breakfast   Continuous Infusions:  heparin 800 Units/hr (09/11/23 0914)     LOS: 10 days    Time spent: 35 minutes    Takasha Vetere A Toria Monte, MD [facility]   If 7PM-7AM, please contact night-coverage [email or web address]  09/11/2023, 2:34 PM

## 2023-09-12 ENCOUNTER — Ambulatory Visit (HOSPITAL_COMMUNITY): Admission: RE | Admit: 2023-09-12 | Source: Home / Self Care | Admitting: Surgery

## 2023-09-12 ENCOUNTER — Encounter (HOSPITAL_COMMUNITY)
Admission: EM | Disposition: A | Discharge status: Hospice | Payer: Self-pay | Source: Skilled Nursing Facility | Attending: Internal Medicine

## 2023-09-12 DIAGNOSIS — L089 Local infection of the skin and subcutaneous tissue, unspecified: Secondary | ICD-10-CM | POA: Diagnosis not present

## 2023-09-12 DIAGNOSIS — L03116 Cellulitis of left lower limb: Principal | ICD-10-CM

## 2023-09-12 HISTORY — PX: LOWER EXTREMITY ANGIOGRAPHY: CATH118251

## 2023-09-12 HISTORY — PX: ABDOMINAL AORTOGRAM: CATH118222

## 2023-09-12 LAB — CBC
HCT: 26.9 % — ABNORMAL LOW (ref 39.0–52.0)
Hemoglobin: 7.7 g/dL — ABNORMAL LOW (ref 13.0–17.0)
MCH: 22.4 pg — ABNORMAL LOW (ref 26.0–34.0)
MCHC: 28.6 g/dL — ABNORMAL LOW (ref 30.0–36.0)
MCV: 78.2 fL — ABNORMAL LOW (ref 80.0–100.0)
Platelets: 342 10*3/uL (ref 150–400)
RBC: 3.44 MIL/uL — ABNORMAL LOW (ref 4.22–5.81)
RDW: 22.9 % — ABNORMAL HIGH (ref 11.5–15.5)
WBC: 9.2 10*3/uL (ref 4.0–10.5)
nRBC: 0 % (ref 0.0–0.2)

## 2023-09-12 LAB — APTT: aPTT: 76 s — ABNORMAL HIGH (ref 24–36)

## 2023-09-12 LAB — BPAM RBC
Blood Product Expiration Date: 202503282359
ISSUE DATE / TIME: 202503240907
Unit Type and Rh: 5100

## 2023-09-12 LAB — GLUCOSE, CAPILLARY: Glucose-Capillary: 105 mg/dL — ABNORMAL HIGH (ref 70–99)

## 2023-09-12 LAB — RENAL FUNCTION PANEL
Albumin: 2.5 g/dL — ABNORMAL LOW (ref 3.5–5.0)
Anion gap: 10 (ref 5–15)
BUN: 64 mg/dL — ABNORMAL HIGH (ref 8–23)
CO2: 31 mmol/L (ref 22–32)
Calcium: 8.1 mg/dL — ABNORMAL LOW (ref 8.9–10.3)
Chloride: 96 mmol/L — ABNORMAL LOW (ref 98–111)
Creatinine, Ser: 2.22 mg/dL — ABNORMAL HIGH (ref 0.61–1.24)
GFR, Estimated: 31 mL/min — ABNORMAL LOW (ref >60)
Glucose, Bld: 94 mg/dL (ref 70–99)
Phosphorus: 4.7 mg/dL — ABNORMAL HIGH (ref 2.5–4.6)
Potassium: 3.4 mmol/L — ABNORMAL LOW (ref 3.5–5.1)
Sodium: 137 mmol/L (ref 135–145)

## 2023-09-12 LAB — TYPE AND SCREEN
ABO/RH(D): O POS
Antibody Screen: NEGATIVE
Unit division: 0

## 2023-09-12 LAB — HEPARIN LEVEL (UNFRACTIONATED): Heparin Unfractionated: >1.1 [IU]/mL — ABNORMAL HIGH (ref 0.30–0.70)

## 2023-09-12 SURGERY — ABDOMINAL AORTOGRAM
Anesthesia: LOCAL

## 2023-09-12 MED ORDER — ONDANSETRON HCL 4 MG/2ML IJ SOLN
INTRAMUSCULAR | Status: DC | PRN
  Administered 2023-09-12: 4 mg via INTRAVENOUS

## 2023-09-12 MED ORDER — FENTANYL CITRATE (PF) 100 MCG/2ML IJ SOLN
INTRAMUSCULAR | Status: AC
  Filled 2023-09-12: qty 2

## 2023-09-12 MED ORDER — SODIUM CHLORIDE 0.9 % IV SOLN: INTRAVENOUS | Status: DC

## 2023-09-12 MED ORDER — ONDANSETRON HCL 4 MG/2ML IJ SOLN
INTRAMUSCULAR | Status: AC
Start: 2023-09-12 — End: ?
  Filled 2023-09-12: qty 2

## 2023-09-12 MED ORDER — SODIUM CHLORIDE 0.9 % IV SOLN: 250.0000 mL | INTRAVENOUS | Status: AC | PRN

## 2023-09-12 MED ORDER — HEPARIN (PORCINE) IN NACL 1000-0.9 UT/500ML-% IV SOLN
INTRAVENOUS | Status: DC | PRN
  Administered 2023-09-12 (×2): 500 mL

## 2023-09-12 MED ORDER — MIDAZOLAM HCL 2 MG/2ML IJ SOLN
INTRAMUSCULAR | Status: AC
  Filled 2023-09-12: qty 2

## 2023-09-12 MED ORDER — ACETAMINOPHEN 325 MG PO TABS
650.0000 mg | ORAL_TABLET | ORAL | Status: DC | PRN
  Administered 2023-09-30: 650 mg via ORAL

## 2023-09-12 MED ORDER — HYDRALAZINE HCL 20 MG/ML IJ SOLN: 5.0000 mg | INTRAMUSCULAR | Status: DC | PRN

## 2023-09-12 MED ORDER — SODIUM CHLORIDE 0.9% FLUSH: 3.0000 mL | INTRAVENOUS | Status: DC | PRN

## 2023-09-12 MED ORDER — LIDOCAINE HCL (PF) 1 % IJ SOLN
INTRAMUSCULAR | Status: DC | PRN
  Administered 2023-09-12: 15 mL

## 2023-09-12 MED ORDER — IODIXANOL 320 MG/ML IV SOLN
INTRAVENOUS | Status: DC | PRN
  Administered 2023-09-12: 4 mL

## 2023-09-12 MED ORDER — SODIUM CHLORIDE 0.9% FLUSH
3.0000 mL | Freq: Two times a day (BID) | INTRAVENOUS | Status: DC
  Administered 2023-09-12 – 2023-10-02 (×31): 3 mL via INTRAVENOUS

## 2023-09-12 MED ORDER — MIDAZOLAM HCL 2 MG/2ML IJ SOLN
INTRAMUSCULAR | Status: DC | PRN
  Administered 2023-09-12 (×2): 1 mg via INTRAVENOUS

## 2023-09-12 MED ORDER — SODIUM CHLORIDE 0.9 % WEIGHT BASED INFUSION: 1.0000 mL/kg/h | INTRAVENOUS | Status: AC

## 2023-09-12 MED ORDER — HEPARIN (PORCINE) 25000 UT/250ML-% IV SOLN
800.0000 [IU]/h | INTRAVENOUS | Status: AC
  Administered 2023-09-12: 800 [IU]/h via INTRAVENOUS
  Filled 2023-09-12: qty 250

## 2023-09-12 MED ORDER — CAMPHOR-MENTHOL 0.5-0.5 % EX LOTN
TOPICAL_LOTION | CUTANEOUS | Status: DC | PRN
  Filled 2023-09-12: qty 222

## 2023-09-12 MED ORDER — FENTANYL CITRATE (PF) 100 MCG/2ML IJ SOLN
INTRAMUSCULAR | Status: DC | PRN
  Administered 2023-09-12: 50 ug via INTRAVENOUS
  Administered 2023-09-12: 25 ug via INTRAVENOUS

## 2023-09-12 MED ORDER — POTASSIUM CHLORIDE CRYS ER 20 MEQ PO TBCR
10.0000 meq | EXTENDED_RELEASE_TABLET | Freq: Once | ORAL | Status: AC
  Administered 2023-09-12: 10 meq via ORAL
  Filled 2023-09-12: qty 1

## 2023-09-12 SURGICAL SUPPLY — 14 items
CATH OMNI FLUSH 5F 65CM (CATHETERS) IMPLANT
CATH QUICKCROSS SUPP .035X90CM (MICROCATHETER) IMPLANT
DEVICE VASC CLSR CELT ART 5 (Vascular Products) IMPLANT
GUIDEWIRE ANGLED .035X150CM (WIRE) IMPLANT
KIT MICROPUNCTURE NIT STIFF (SHEATH) IMPLANT
KIT PV (KITS) ×1 IMPLANT
SET ATX-X65L (MISCELLANEOUS) IMPLANT
SHEATH PINNACLE 5F 10CM (SHEATH) IMPLANT
SHEATH PROBE COVER 6X72 (BAG) IMPLANT
SYR MEDRAD MARK 7 150ML (SYRINGE) ×1 IMPLANT
TRANSDUCER W/STOPCOCK (MISCELLANEOUS) ×1 IMPLANT
TRAY PV CATH (CUSTOM PROCEDURE TRAY) ×1 IMPLANT
WIRE BENTSON .035X145CM (WIRE) IMPLANT
WIRE HITORQ VERSACORE ST 145CM (WIRE) IMPLANT

## 2023-09-12 NOTE — Interval H&P Note (Signed)
 History and Physical Interval Note:  09/12/2023 10:47 AM  Jordan [name]  has presented today for surgery, with the diagnosis of cellilitus left lower extremity.  The various methods of treatment have been discussed with the patient and family. After consideration of risks, benefits and other options for treatment, the patient has consented to  Procedure(s): ABDOMINAL AORTOGRAM (N/A) Lower Extremity Angiography (N/A) as a surgical intervention.  The patient's history has been reviewed, patient examined, no change in status, stable for surgery.  I have reviewed the patient's chart and labs.  Questions were answered to the patient's satisfaction.     Durene Cal

## 2023-09-12 NOTE — Progress Notes (Signed)
 Patient ID: Jordan [name], male   DOB: 06/26/1950, 73 y.o.   MRN: [id number] S: Pt has been at [facility] cath lab today so was not seen physically. O:BP 111/72   Pulse (!) 130   Temp 97.6 F (36.4 C) (Oral)   Resp 16   Ht 6\' 1"  (1.854 m)   Wt 99.6 kg   SpO2 97%   BMI 28.97 kg/m   Intake/Output Summary (Last 24 hours) at 09/12/2023 1416 Last data filed at 09/12/2023 1030 Gross per 24 hour  Intake 888.41 ml  Output 1825 ml  Net -936.59 ml   Intake/Output: I/O last 3 completed shifts: In: 1946.1 [P.O.:1220; I.V.:351.1; Blood:350; IV Piggyback:25] Out: 3675 [Urine:3675]  Intake/Output this shift:  Total I/O In: 0  Out: 300 [Urine:300] Weight change:    Recent Labs  Lab 09/06/23 0458 09/07/23 0454 09/08/23 0803 09/09/23 1022 09/10/23 0457 09/11/23 0419 09/12/23 0719  NA 132* 132* 135 135 137 139 137  K 4.5 4.6 4.4 3.9 3.8 3.7 3.4*  CL 104 103 104 104 101 99 96*  CO2 21* 21* 21* 23 26 29 31   GLUCOSE 89 90 88 155* 91 88 94  BUN 39* 58* 59* 60* 57* 62* 64*  CREATININE 2.69* 2.61* 2.46* 2.22* 2.22* 2.28* 2.22*  ALBUMIN 2.0*  --   --  2.0* 2.1* 2.8* 2.5*  CALCIUM 7.7* 7.6* 7.8* 7.9* 8.0* 8.3* 8.1*  PHOS  --   --   --  4.6 4.7* 4.9* 4.7*  AST 24  --   --   --   --   --   --   ALT 16  --   --   --   --   --   --    Liver Function Tests: Recent Labs  Lab 09/06/23 0458 09/09/23 1022 09/10/23 0457 09/11/23 0419 09/12/23 0719  AST 24  --   --   --   --   ALT 16  --   --   --   --   ALKPHOS 128*  --   --   --   --   BILITOT 0.6  --   --   --   --   PROT 6.0*  --   --   --   --   ALBUMIN 2.0*   < > 2.1* 2.8* 2.5*   < > = values in this interval not displayed.   No results for input(s): "LIPASE", "AMYLASE" in the last 168 hours. No results for input(s): "AMMONIA" in the last 168 hours. CBC: Recent Labs  Lab 09/08/23 1710 09/09/23 1639 09/10/23 0503 09/11/23 0419 09/11/23 1431 09/12/23 0722  WBC 9.7 8.7 8.6 8.0  --  9.2  HGB 7.6* 7.5* 7.5* 6.7* 7.7* 7.7*  HCT  27.3* 26.5* 27.1* 23.0* 25.9* 26.9*  MCV 77.8* 76.8* 78.3* 75.9*  --  78.2*  PLT 274 279 294 353  --  342   Cardiac Enzymes: No results for input(s): "CKTOTAL", "CKMB", "CKMBINDEX", "TROPONINI" in the last 168 hours. CBG: No results for input(s): "GLUCAP" in the last 168 hours.  Iron Studies: No results for input(s): "IRON", "TIBC", "TRANSFERRIN", "FERRITIN" in the last 72 hours. Studies/Results: PERIPHERAL VASCULAR CATHETERIZATION Result Date: 09/12/2023 Table formatting from the original result was not included. Images from the original result were not included.   Patient name: Jordan [name]     MRN: [id number]        DOB: [birth year]          Sex:  male  09/12/2023 Pre-operative Diagnosis: Left leg ulcer Post-operative diagnosis:  Same Surgeon:  Durene Cal Procedure Performed:            1.  Ultrasound-guided access, right femoral artery            2.  Abdominal gram with CO2            3.  Selective images with catheter in left popliteal artery            4.  Left leg angiogram            5.  Conscious sedation, 31 minutes            6.  Closure device, Celt              Indications: This is a 73 year old gentleman with nonhealing wounds on both legs left worse than the right.  He had ABIs that suggested monophasic waveforms.  He is here for further evaluation.  He does have renal insufficiency and so contrast will be minimized.  Procedure:  The patient was identified in the holding area and taken to room 8.  The patient was then placed supine on the table and prepped and draped in the usual sterile fashion.  A time out was called.  Conscious sedation was administered with the use of IV fentanyl and Versed under continuous physician and nurse monitoring.  Heart rate, blood pressure, and oxygen saturation were continuously monitored.  Total sedation time was 31 minutes.  Ultrasound was used to evaluate the right common femoral artery.  It was patent .  A digital ultrasound image was acquired.  A  micropuncture needle was used to access the right common femoral artery under ultrasound guidance.  An 018 wire was advanced without resistance and a micropuncture sheath was placed.  The 018 wire was removed and a benson wire was placed.  The micropuncture sheath was exchanged for a 5 french sheath.  An omniflush catheter was advanced over the wire to the level of L-1.  An abdominal angiogram with CO2 was obtained.  Next, using the omniflush catheter and a benson wire, the aortic bifurcation was crossed and the catheter was placed into theleft external iliac artery and left runoff was obtained.  Additional images were performed with a catheter in the left popliteal artery from the right groin  Findings:            Aortogram: No evidence of renal artery stenosis.  Due to bowel gas, the abdominal aorta was not well-evaluated.  There did not appear to be any significant stenosis.  Bilateral common and external iliac arteries were widely patent.            Right Lower Extremity: Not evaluated             Left Lower Extremity: The left common femoral, profundofemoral, and superficial femoral artery are widely patent.  The popliteal artery is widely patent.  The patient has three-vessel runoff.  The dominant vessel across the ankle is the anterior tibial.  There is mild stenosis in the mid posterior tibial artery, however this does not have significant perfusion of the forefoot.  Intervention: No intervention was performed as the patient should have adequate blood flow to heal his wounds.  Impression:             #1  No significant aortoiliac occlusive disease             #2  No significant left femoral-popliteal  stenosis             #3  Anterior tibial is a dominant vessel across the ankle perfusing the forefoot.  The posterior tibial does have approximately a 50% lesion in its midportion however this does not contribute much to the blood flow out onto the foot.  The patient should have adequate blood flow for wound  healing             #4  Recommend outpatient wound follow-up with Dr. Kandis Mannan. Durene Cal, M.D., FACS [facility] Office: [phone] Pager:  [phone]       [facility] Hold] atorvastatin  80 mg Oral QODAY   [MAR Hold] doxycycline  100 mg Oral Q12H   [MAR Hold] ipratropium-albuterol  3 mL Nebulization BID   [MAR Hold] LORazepam  1 mg Oral BID   [MAR Hold] mometasone-formoterol  2 puff Inhalation BID   [MAR Hold] multivitamin with minerals  1 tablet Oral Q breakfast   [MAR Hold] pantoprazole  40 mg Oral Daily   sodium chloride flush  3 mL Intravenous Q12H   [MAR Hold] venlafaxine XR  75 mg Oral Q breakfast    BMET    Component Value Date/Time   NA 137 09/12/2023 0719   NA 139 07/02/2021 1322   K 3.4 (L) 09/12/2023 0719   CL 96 (L) 09/12/2023 0719   CO2 31 09/12/2023 0719   GLUCOSE 94 09/12/2023 0719   BUN 64 (H) 09/12/2023 0719   BUN 26 07/02/2021 1322   CREATININE 2.22 (H) 09/12/2023 0719   CALCIUM 8.1 (L) 09/12/2023 0719   GFRNONAA 31 (L) 09/12/2023 0719   GFRAA 65 07/23/2020 1533   CBC    Component Value Date/Time   WBC 9.2 09/12/2023 0722   RBC 3.44 (L) 09/12/2023 0722   HGB 7.7 (L) 09/12/2023 0722   HGB 11.0 (L) 07/02/2021 1322   HCT 26.9 (L) 09/12/2023 0722   HCT 35.0 (L) 07/02/2021 1322   PLT 342 09/12/2023 0722   PLT 266 07/02/2021 1322   MCV 78.2 (L) 09/12/2023 0722   MCV 81 07/02/2021 1322   MCH 22.4 (L) 09/12/2023 0722   MCHC 28.6 (L) 09/12/2023 0722   RDW 22.9 (H) 09/12/2023 0722   RDW 15.3 07/02/2021 1322   LYMPHSABS 1.2 09/04/2023 0407   MONOABS 0.8 09/04/2023 0407   EOSABS 0.4 09/04/2023 0407   BASOSABS 0.0 09/04/2023 0407    Assessment/ Plan: AKI: b/l creat is 1.3, from PCP's office in Jan 2024. Assuming this is AKI but could also have CKD. UA shows some rbc's/ wbc's, no proteinuria. Renal US w/o obstruction. BP's were soft in 90s on presentation and then improved after IV abx and IVF's, but pt developed pulm edema  on CXR #2 w/ bilat LE edema. Suspect CKD and/or ATN / vol overload as cause of renal failure. Started IV lasix 3/19, titrating up the dose and the UOP is improving. 4.4 L UOP snd creat stable in low 2's. Cont diuresis. He dropped his Bp on the afternoon of 3/23 so Lasix was decreased to 80 mg IV tid.  Zaroxolyn on hold.  Bp was low again yesterday and IV lasix was stopped.  Good UOP and continue to follow for now without lasix.  He did have a renal CO2 angiogram which was negative for renal artery stenosis.   Soft tissue infection: SP IV abx, is now on po keflex Left leg ulcer - normal left leg angiogram Anemia -  hgb down to 6.7.  to receive blood transfusion today.  COPD Atrial fib S/p TAVR Chronic resp failure - on home O2 2 L HTN - bp's low, doesn't need atenolol now, will dc.   Irena Cords, MD [employer] [phone]

## 2023-09-12 NOTE — Progress Notes (Signed)
 PT Cancellation Note  Patient Details Name: Jordan [name] MRN: [id number] DOB: 02/22/1951   Cancelled Treatment:    Reason Eval/Treat Not Completed: Patient at procedure or test/unavailable (pt at [facility] cath lab. Will follow.)   Tamala Ser PT 09/12/2023  Acute Rehabilitation Services  Office [phone]

## 2023-09-12 NOTE — Plan of Care (Signed)
   Problem: Education: Goal: Knowledge of General Education information will improve Description Including pain rating scale, medication(s)/side effects and non-pharmacologic comfort measures Outcome: Progressing   Problem: Health Behavior/Discharge Planning: Goal: Ability to manage health-related needs will improve Outcome: Progressing

## 2023-09-12 NOTE — Plan of Care (Signed)
  Problem: Education: Goal: Knowledge of General Education information will improve Description: Including pain rating scale, medication(s)/side effects and non-pharmacologic comfort measures Outcome: Progressing   Problem: Health Behavior/Discharge Planning: Goal: Ability to manage health-related needs will improve Outcome: Progressing   Problem: Clinical Measurements: Goal: Ability to maintain clinical measurements within normal limits will improve Outcome: Progressing Goal: Will remain free from infection Outcome: Progressing Goal: Diagnostic test results will improve Outcome: Progressing Goal: Respiratory complications will improve Outcome: Progressing Goal: Cardiovascular complication will be avoided Outcome: Progressing   Problem: Activity: Goal: Risk for activity intolerance will decrease Outcome: Progressing   Problem: Nutrition: Goal: Adequate nutrition will be maintained Outcome: Progressing   Problem: Coping: Goal: Level of anxiety will decrease Outcome: Progressing   Problem: Elimination: Goal: Will not experience complications related to bowel motility Outcome: Progressing Goal: Will not experience complications related to urinary retention Outcome: Progressing   Problem: Pain Managment: Goal: General experience of comfort will improve and/or be controlled Outcome: Progressing   Problem: Safety: Goal: Ability to remain free from injury will improve Outcome: Progressing   Problem: Skin Integrity: Goal: Risk for impaired skin integrity will decrease Outcome: Progressing   Problem: Clinical Measurements: Goal: Ability to avoid or minimize complications of infection will improve Outcome: Progressing   Problem: Skin Integrity: Goal: Skin integrity will improve Outcome: Progressing   Problem: Education: Goal: Understanding of CV disease, CV risk reduction, and recovery process will improve Outcome: Progressing Goal: Individualized Educational  Video(s) Outcome: Progressing   Problem: Activity: Goal: Ability to return to baseline activity level will improve Outcome: Progressing   Problem: Cardiovascular: Goal: Ability to achieve and maintain adequate cardiovascular perfusion will improve Outcome: Progressing Goal: Vascular access site(s) Level 0-1 will be maintained Outcome: Progressing   Problem: Health Behavior/Discharge Planning: Goal: Ability to safely manage health-related needs after discharge will improve Outcome: Progressing

## 2023-09-12 NOTE — Progress Notes (Signed)
 PHARMACY - ANTICOAGULATION CONSULT NOTE  Pharmacy Consult for  Eliquis --> heparin  Indication: hx atrial fibrillation  Allergies  Allergen Reactions   Altace [Ramipril] Other (See Comments)    Dizziness, migraine, weakness- "there is dye in it"   Codeine Itching   Metoprolol Tartrate Other (See Comments)    Dropped the B/P too low- eventually stopped by a MD   Naproxen Itching    Patient Measurements: Height: 6\' 1"  (185.4 cm) Weight: 99.6 kg (219 lb 9.3 oz) IBW/kg (Calculated) : 79.9 Heparin Dosing Weight: 81 kg  Vital Signs: Temp: 97.9 F (36.6 C) (03/25 0457) Temp Source: Oral (03/25 0457) BP: 119/78 (03/25 0457) Pulse Rate: 114 (03/25 0457)  Labs: Recent Labs    09/09/23 1839 09/10/23 0457 09/10/23 0503 09/10/23 1230 09/11/23 0419 09/11/23 1245 09/11/23 1431 09/12/23 0719 09/12/23 0722  HGB  --   --  7.5*  --  6.7*  --  7.7*  --  7.7*  HCT  --   --  27.1*  --  23.0*  --  25.9*  --  26.9*  PLT  --   --  294  --  353  --   --   --  342  APTT 48* 87*  --    < > 99* 71*  --  76*  --   HEPARINUNFRC >1.10* >1.10*  --   --   --   --   --  >1.10*  --   CREATININE  --  2.22*  --   --  2.28*  --   --  2.22*  --    < > = values in this interval not displayed.    Estimated Creatinine Clearance: 36.8 mL/min (A) (by C-G formula based on SCr of 2.22 mg/dL (H)).  Medications:  - on Eliquis 5 mg bid PTA   Assessment: Patient is a 73 y.o M with hx CKD, PVD, bilateral venous stasis disease and afib on Eliquis PTA who presented to the ED on 09/01/23 with worsening of left leg wound/pain sustained after an injury at the nursing facility. He was started on abx for wound infection and Eliquis resumed on admission. ABIs performed on 09/05/22, but they were unable to fully do ABI of the left leg due to pt's inability to keep still. Pharmacy has been consulted to change anticoag. to heparin drip on 09/09/23 in anticipation for angiography on 09/12/23 to evaluate blood flow to his left  leg.   Today, 09/12/2023: aPTT 76 seconds>  therapeutic after heparin drip resumed at rate of 800 units/hr at 2206 last night Heparin level > 1.1 - elevated d/t PTA Eliquis Hg 6.7> 7.7 after 1 unit PRBCs transfused on 3/24 PLTs WNL 3/24 PM large amount of bleeding from IV site infusing heparin, see RN note.  Pressure held 15-20 minutes. Heparin drip off 2030- 2206\ 3/25 No bleeding at IV site per RN this morning, no other bleeding noted Has aortogram scheduled for 12 noon today @ [facility]  Goal of Therapy:  Heparin level 0.3-0.7 units/ml aPTT 66-102 seconds Monitor platelets by anticoagulation protocol: Yes   Plan:  Continue heparin drip at 800 units/hr Daily aPTT & CBC Monitor for s/sx bleeding  Abdominal aortogram scheduled today at 12noon @ [facility]>> f/u for any peri-procedural orders pertaining to heparin drip   Herby Abraham, Pharm.D Use secure chat for questions 09/12/2023 9:02 AM

## 2023-09-12 NOTE — Progress Notes (Addendum)
 PROGRESS NOTE    Jordan [name]  WVP:[id number] DOB: 1950-11-23 DOA: 09/01/2023 PCP: Kennith Center, NP   Brief Narrative: 73 year old with past medical history significant for hypertension, Heart failure preserved ejection fraction, A-fib, CAD, COPD with chronic hypoxic respiratory failure on 2 L of oxygen, chronic venous stasis, PVD admitted 3/17 for worsening cellulitis failing outpatient antibiotics, after injury at nursing home previous week.   Hospital course complicated by AKI on CKD, pulmonary edema, hypotension required IV albumin and discontinuation of atenolol.  Due to concern for peripheral vascular disease and lower extremity wounds he underwent arteriogram by Dr. Myra Gianotti on 3/25, which showed no significant aortoiliac occlusive disease or femoropopliteal disease, anterior tibial dominant vessel perfusing to the ankle and forefoot.  Posterior tibial has 50% lesion midportion however does not contribute much to the blood flow.  Patient she will have adequate blood flow for wound healing.     Assessment & Plan:   Principal Problem:   Soft tissue infection Active Problems:   Persistent atrial fibrillation (HCC)   Essential hypertension   Chronic heart failure with preserved ejection fraction (HCC)   COPD (chronic obstructive pulmonary disease) (HCC)   Chronic respiratory failure with hypoxia (HCC)   HLD (hyperlipidemia)   Varicose veins of bilateral lower extremities with other complications   Stage 3 chronic kidney disease (HCC)   1-Soft tissue infection, LE infection, ulcers.  Chronic venous stasis disease PVD -ABI: Right: Resting right ankle-brachial index is within normal range. Unable to obtain TBI due to great toe ulceration.  Left:  Unable to obtain ABI due to patient pain tolerance and movement.  Monophasic waveforms are detected in the dorsalis pedis and posterior  tibial arteries.  Unable to obtain TBI due to great toe ulceration.  -Continue local care.   -Completed 10 days Keflex, He will complete 5 days of Doxy on 3/25. -Vascular consulted. Underwent arteriogram 3/25, no insignificant PVD, has adequate blood flow for wound healing.  -Redness less intense. Needs to follow up with Dr Lajoyce Corners out patient.   AKI, on ? CKD -Previous cr 0.9 (2023) -Creatinine peak  at 2.6 -Renal ultrasound obtained negative for hydronephrosis -Nephrology consulted, managing diuresis.  -3/19:Chest x-ray with pulmonary edema -Cr [facility]  2.2 -3/21: Lasix increased to 120 mg Q 8 Hours.  - Received albumin 3/23. One time dose IV lasix.  Lasix on hold per nephrology.   Hypokalemia: Replete.   Hypertension://Hypotension.  Holding  atenolol to avoid hypotension Had soft BP, lasix held.  Received IV albumin.   A-fib: On heparin in anticipation of arteriogram.  Should  able to resume Eliquis tomorrow.  Atenolol discontinue by nephrology due to hypotension.  If HR continue to be elevated, might need to start Amiodarone or could try low dose metoprolol if BP allows.   COPD Chronic hypoxic respiratory failure on 2 L of oxygen 3/19: Wheezing, started scheduled albuterol  Anxiety depression: Continue Effexor and Ativan  Anemia; iron deficiency:  Received  IV iron 3/23. Hb down to 6. Received  one unit PRBC> 3/24. No evidence of active bleeding.  Hb 7.6  See wound documentation below.  Pressure Injury 09/02/23 Heel Left Unstageable - Full thickness tissue loss in which the base of the injury is covered by slough (yellow, tan, gray, green or brown) and/or eschar (tan, brown or black) in the wound bed. (Active)  09/02/23 1713  Location: Heel  Location Orientation: Left  Staging: Unstageable - Full thickness tissue loss in which the base of the injury  is covered by slough (yellow, tan, gray, green or brown) and/or eschar (tan, brown or black) in the wound bed.  Wound Description (Comments):   Present on Admission: Yes  Dressing Type Foam - Lift dressing to  assess site every shift 09/11/23 2005     Estimated body mass index is 28.97 kg/m as calculated from the following:   Height as of this encounter: 6\' 1"  (1.854 m).   Weight as of this encounter: 99.6 kg.   DVT prophylaxis: Eliquis Code Status: Full code Family Communication: Discussed with patient Disposition Plan:  Status is: Inpatient Remains inpatient appropriate because: Management of AKI and lower extremity wounds    Consultants:  Nephrology Vascular  Procedures:  Renal US  Antimicrobials:    Subjective: Seen after arteriogram, he is feeling ok, denies dyspnea, he is happy with news, he has adequate blood flow for healing.   Objective: Vitals:   09/12/23 1400 09/12/23 1500 09/12/23 1530 09/12/23 1649  BP: (!) 142/67 112/70 108/60 110/79  Pulse:  (!) 113 (!) 110 (!) 48  Resp: 16 16 15    Temp:    (!) 97.4 F (36.3 C)  TempSrc:    Oral  SpO2: 94% 96% 95% 100%  Weight:      Height:        Intake/Output Summary (Last 24 hours) at 09/12/2023 1718 Last data filed at 09/12/2023 1030 Gross per 24 hour  Intake 888.41 ml  Output 1825 ml  Net -936.59 ml   Filed Weights   09/07/23 0500 09/09/23 0500 09/12/23 0500  Weight: 103.6 kg 105.3 kg 99.6 kg    Examination:  General exam: NAD Respiratory system: Normal respiratory effort, no wheezing.  Cardiovascular system: S 1, S 2 IRR Gastrointestinal system: BS present, soft nt Central nervous system: Alert, follows command Extremities: BL LE ulceration, toe with ulceration LE with less intense  redness. Hyperpigmentation BL changes consistent with chronic venous insufficiency.    Data Reviewed: I have personally reviewed following labs and imaging studies  CBC: Recent Labs  Lab 09/08/23 1710 09/09/23 1639 09/10/23 0503 09/11/23 0419 09/11/23 1431 09/12/23 0722  WBC 9.7 8.7 8.6 8.0  --  9.2  HGB 7.6* 7.5* 7.5* 6.7* 7.7* 7.7*  HCT 27.3* 26.5* 27.1* 23.0* 25.9* 26.9*  MCV 77.8* 76.8* 78.3* 75.9*  --   78.2*  PLT 274 279 294 353  --  342   Basic Metabolic Panel: Recent Labs  Lab 09/08/23 0803 09/09/23 1022 09/10/23 0457 09/11/23 0419 09/12/23 0719  NA 135 135 137 139 137  K 4.4 3.9 3.8 3.7 3.4*  CL 104 104 101 99 96*  CO2 21* 23 26 29 31   GLUCOSE 88 155* 91 88 94  BUN 59* 60* 57* 62* 64*  CREATININE 2.46* 2.22* 2.22* 2.28* 2.22*  CALCIUM 7.8* 7.9* 8.0* 8.3* 8.1*  PHOS  --  4.6 4.7* 4.9* 4.7*   GFR: Estimated Creatinine Clearance: 36.8 mL/min (A) (by C-G formula based on SCr of 2.22 mg/dL (H)). Liver Function Tests: Recent Labs  Lab 09/06/23 0458 09/09/23 1022 09/10/23 0457 09/11/23 0419 09/12/23 0719  AST 24  --   --   --   --   ALT 16  --   --   --   --   ALKPHOS 128*  --   --   --   --   BILITOT 0.6  --   --   --   --   PROT 6.0*  --   --   --   --  ALBUMIN 2.0* 2.0* 2.1* 2.8* 2.5*   No results for input(s): "LIPASE", "AMYLASE" in the last 168 hours. No results for input(s): "AMMONIA" in the last 168 hours. Coagulation Profile: No results for input(s): "INR", "PROTIME" in the last 168 hours. Cardiac Enzymes: No results for input(s): "CKTOTAL", "CKMB", "CKMBINDEX", "TROPONINI" in the last 168 hours. BNP (last 3 results) No results for input(s): "PROBNP" in the last 8760 hours. HbA1C: No results for input(s): "HGBA1C" in the last 72 hours. CBG: No results for input(s): "GLUCAP" in the last 168 hours. Lipid Profile: No results for input(s): "CHOL", "HDL", "LDLCALC", "TRIG", "CHOLHDL", "LDLDIRECT" in the last 72 hours. Thyroid Function Tests: No results for input(s): "TSH", "T4TOTAL", "FREET4", "T3FREE", "THYROIDAB" in the last 72 hours. Anemia Panel: No results for input(s): "VITAMINB12", "FOLATE", "FERRITIN", "TIBC", "IRON", "RETICCTPCT" in the last 72 hours. Sepsis Labs: No results for input(s): "PROCALCITON", "LATICACIDVEN" in the last 168 hours.   No results found for this or any previous visit (from the past 240 hours).        Radiology  Studies: PERIPHERAL VASCULAR CATHETERIZATION Result Date: 09/12/2023 Table formatting from the original result was not included. Images from the original result were not included.   Patient name: Jordan [name]     MRN: [id number]        DOB: [birth year]          Sex: male  09/12/2023 Pre-operative Diagnosis: Left leg ulcer Post-operative diagnosis:  Same Surgeon:  Durene Cal Procedure Performed:            1.  Ultrasound-guided access, right femoral artery            2.  Abdominal gram with CO2            3.  Selective images with catheter in left popliteal artery            4.  Left leg angiogram            5.  Conscious sedation, 31 minutes            6.  Closure device, Celt              Indications: This is a 73 year old gentleman with nonhealing wounds on both legs left worse than the right.  He had ABIs that suggested monophasic waveforms.  He is here for further evaluation.  He does have renal insufficiency and so contrast will be minimized.  Procedure:  The patient was identified in the holding area and taken to room 8.  The patient was then placed supine on the table and prepped and draped in the usual sterile fashion.  A time out was called.  Conscious sedation was administered with the use of IV fentanyl and Versed under continuous physician and nurse monitoring.  Heart rate, blood pressure, and oxygen saturation were continuously monitored.  Total sedation time was 31 minutes.  Ultrasound was used to evaluate the right common femoral artery.  It was patent .  A digital ultrasound image was acquired.  A micropuncture needle was used to access the right common femoral artery under ultrasound guidance.  An 018 wire was advanced without resistance and a micropuncture sheath was placed.  The 018 wire was removed and a benson wire was placed.  The micropuncture sheath was exchanged for a 5 french sheath.  An omniflush catheter was advanced over the wire to the level of L-1.  An abdominal angiogram with  CO2 was obtained.  Next, using the omniflush  catheter and a benson wire, the aortic bifurcation was crossed and the catheter was placed into theleft external iliac artery and left runoff was obtained.  Additional images were performed with a catheter in the left popliteal artery from the right groin  Findings:            Aortogram: No evidence of renal artery stenosis.  Due to bowel gas, the abdominal aorta was not well-evaluated.  There did not appear to be any significant stenosis.  Bilateral common and external iliac arteries were widely patent.            Right Lower Extremity: Not evaluated             Left Lower Extremity: The left common femoral, profundofemoral, and superficial femoral artery are widely patent.  The popliteal artery is widely patent.  The patient has three-vessel runoff.  The dominant vessel across the ankle is the anterior tibial.  There is mild stenosis in the mid posterior tibial artery, however this does not have significant perfusion of the forefoot.  Intervention: No intervention was performed as the patient should have adequate blood flow to heal his wounds.  Impression:             #1  No significant aortoiliac occlusive disease             #2  No significant left femoral-popliteal stenosis             #3  Anterior tibial is a dominant vessel across the ankle perfusing the forefoot.  The posterior tibial does have approximately a 50% lesion in its midportion however this does not contribute much to the blood flow out onto the foot.  The patient should have adequate blood flow for wound healing             #4  Recommend outpatient wound follow-up with Dr. Kandis Mannan. Durene Cal, M.D., FACS [facility] Office: [phone] Pager:  [phone]             Scheduled Meds:  atorvastatin  80 mg Oral QODAY   doxycycline  100 mg Oral Q12H   ipratropium-albuterol  3 mL Nebulization BID   LORazepam  1 mg Oral BID   mometasone-formoterol  2 puff  Inhalation BID   multivitamin with minerals  1 tablet Oral Q breakfast   pantoprazole  40 mg Oral Daily   potassium chloride  10 mEq Oral Once   sodium chloride flush  3 mL Intravenous Q12H   venlafaxine XR  75 mg Oral Q breakfast   Continuous Infusions:  sodium chloride     sodium chloride 1 mL/kg/hr (09/12/23 1245)   heparin       LOS: 11 days    Time spent: 35 minutes    Icel Castles A Tammee Thielke, MD [facility]   If 7PM-7AM, please contact night-coverage [email or web address]  09/12/2023, 5:18 PM

## 2023-09-12 NOTE — Op Note (Signed)
    Patient name: Jordan [name] MRN: [id number] DOB: 01-05-1951 Sex: male  09/12/2023 Pre-operative Diagnosis: Left leg ulcer Post-operative diagnosis:  Same Surgeon:  Durene Cal Procedure Performed:  1.  Ultrasound-guided access, right femoral artery  2.  Abdominal gram with CO2  3.  Selective images with catheter in left popliteal artery  4.  Left leg angiogram  5.  Conscious sedation, 31 minutes  6.  Closure device, Celt     Indications: This is a 73 year old gentleman with nonhealing wounds on both legs left worse than the right.  He had ABIs that suggested monophasic waveforms.  He is here for further evaluation.  He does have renal insufficiency and so contrast will be minimized.  Procedure:  The patient was identified in the holding area and taken to room 8.  The patient was then placed supine on the table and prepped and draped in the usual sterile fashion.  A time out was called.  Conscious sedation was administered with the use of IV fentanyl and Versed under continuous physician and nurse monitoring.  Heart rate, blood pressure, and oxygen saturation were continuously monitored.  Total sedation time was 31 minutes.  Ultrasound was used to evaluate the right common femoral artery.  It was patent .  A digital ultrasound image was acquired.  A micropuncture needle was used to access the right common femoral artery under ultrasound guidance.  An 018 wire was advanced without resistance and a micropuncture sheath was placed.  The 018 wire was removed and a benson wire was placed.  The micropuncture sheath was exchanged for a 5 french sheath.  An omniflush catheter was advanced over the wire to the level of L-1.  An abdominal angiogram with CO2 was obtained.  Next, using the omniflush catheter and a benson wire, the aortic bifurcation was crossed and the catheter was placed into theleft external iliac artery and left runoff was obtained.  Additional images were performed with a catheter  in the left popliteal artery from the right groin  Findings:   Aortogram: No evidence of renal artery stenosis.  Due to bowel gas, the abdominal aorta was not well-evaluated.  There did not appear to be any significant stenosis.  Bilateral common and external iliac arteries were widely patent.  Right Lower Extremity: Not evaluated  Left Lower Extremity: The left common femoral, profundofemoral, and superficial femoral artery are widely patent.  The popliteal artery is widely patent.  The patient has three-vessel runoff.  The dominant vessel across the ankle is the anterior tibial.  There is mild stenosis in the mid posterior tibial artery, however this does not have significant perfusion of the forefoot.  Intervention: No intervention was performed as the patient should have adequate blood flow to heal his wounds.  Impression:  #1  No significant aortoiliac occlusive disease  #2  No significant left femoral-popliteal stenosis  #3  Anterior tibial is a dominant vessel across the ankle perfusing the forefoot.  The posterior tibial does have approximately a 50% lesion in its midportion however this does not contribute much to the blood flow out onto the foot.  The patient should have adequate blood flow for wound healing  #4  Recommend outpatient wound follow-up with Dr. Kandis Mannan. Durene Cal, M.D., [facility] [place] Office: [phone] Pager:  [phone]

## 2023-09-12 NOTE — Progress Notes (Signed)
 OT Cancellation Note  Patient Details Name: Jordan [name] MRN: [id number] DOB: December 14, 1950   Cancelled Treatment:    Reason Eval/Treat Not Completed: Patient at procedure or test/ unavailable. Pt off unit at OR. OT will follow up next available time as appropriate  Galen Manila 09/12/2023, 11:16 AM

## 2023-09-13 ENCOUNTER — Encounter (HOSPITAL_COMMUNITY): Payer: Self-pay | Admitting: Surgery

## 2023-09-13 DIAGNOSIS — L089 Local infection of the skin and subcutaneous tissue, unspecified: Secondary | ICD-10-CM | POA: Diagnosis not present

## 2023-09-13 LAB — LIPID PANEL
Cholesterol: 97 mg/dL (ref 0–200)
HDL: 26 mg/dL — ABNORMAL LOW (ref >40)
LDL Cholesterol: 56 mg/dL (ref 0–99)
Total CHOL/HDL Ratio: 3.7 ratio
Triglycerides: 77 mg/dL (ref <150)
VLDL: 15 mg/dL (ref 0–40)

## 2023-09-13 LAB — RENAL FUNCTION PANEL
Albumin: 2.6 g/dL — ABNORMAL LOW (ref 3.5–5.0)
Anion gap: 11 (ref 5–15)
BUN: 61 mg/dL — ABNORMAL HIGH (ref 8–23)
CO2: 28 mmol/L (ref 22–32)
Calcium: 8.3 mg/dL — ABNORMAL LOW (ref 8.9–10.3)
Chloride: 98 mmol/L (ref 98–111)
Creatinine, Ser: 2.08 mg/dL — ABNORMAL HIGH (ref 0.61–1.24)
GFR, Estimated: 33 mL/min — ABNORMAL LOW (ref >60)
Glucose, Bld: 94 mg/dL (ref 70–99)
Phosphorus: 4.2 mg/dL (ref 2.5–4.6)
Potassium: 3.8 mmol/L (ref 3.5–5.1)
Sodium: 137 mmol/L (ref 135–145)

## 2023-09-13 LAB — CBC
HCT: 26.3 % — ABNORMAL LOW (ref 39.0–52.0)
Hemoglobin: 7.5 g/dL — ABNORMAL LOW (ref 13.0–17.0)
MCH: 22.7 pg — ABNORMAL LOW (ref 26.0–34.0)
MCHC: 28.5 g/dL — ABNORMAL LOW (ref 30.0–36.0)
MCV: 79.7 fL — ABNORMAL LOW (ref 80.0–100.0)
Platelets: 353 10*3/uL (ref 150–400)
RBC: 3.3 MIL/uL — ABNORMAL LOW (ref 4.22–5.81)
RDW: 23.1 % — ABNORMAL HIGH (ref 11.5–15.5)
WBC: 9.9 10*3/uL (ref 4.0–10.5)
nRBC: 0 % (ref 0.0–0.2)

## 2023-09-13 LAB — APTT: aPTT: 47 s — ABNORMAL HIGH (ref 24–36)

## 2023-09-13 LAB — HEPARIN LEVEL (UNFRACTIONATED): Heparin Unfractionated: 0.96 [IU]/mL — ABNORMAL HIGH (ref 0.30–0.70)

## 2023-09-13 MED ORDER — APIXABAN 5 MG PO TABS
5.0000 mg | ORAL_TABLET | Freq: Two times a day (BID) | ORAL | Status: DC
  Administered 2023-09-13 – 2023-09-18 (×12): 5 mg via ORAL
  Filled 2023-09-13 (×13): qty 1

## 2023-09-13 NOTE — Progress Notes (Signed)
 Physical Therapy Treatment Patient Details Name: Jordan [name] MRN: [id number] DOB: [birth year] Today's Date: 09/13/2023   History of Present Illness 73 yo male admitted with soft tissue infection/L LE wound, L toe laceration after sustaining a fall at ALF while be transferred back to bed per pt report. 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 Therapist returned, began setting up bedside chair while discussing current level and impairments. Pt then refused any mobility or EOB sitting due to not feeling well. Pt did participate in B LE A/AROM exercises to facilitate circulation and strength. Education provided on benefits of mobility. Initial recs for STR remain appropriate.   If plan is discharge home, recommend the following: A lot of help with walking and/or transfers;A lot of help with bathing/dressing/bathroom;Assistance with cooking/housework;Assist for transportation;Help with stairs or ramp for entrance   Can travel by private vehicle     No  Equipment Recommendations  None recommended by PT    Recommendations for Other Services       Precautions / Restrictions Precautions Precautions: Fall Precaution/Restrictions Comments: multiple LE wounds-tender to touch; chronic 2L baseline Restrictions Weight Bearing Restrictions Per Provider Order: No     Mobility  Bed Mobility               General bed mobility comments: Pt refused    Transfers                   General transfer comment: pt refused due to pain and fatigue    Ambulation/Gait                   Stairs             Wheelchair Mobility     Tilt Bed    Modified Rankin (Stroke Patients Only)       Balance Overall balance assessment: Needs assistance Sitting-balance support:  Bilateral upper extremity supported, Feet supported Sitting balance-Leahy Scale: Poor Sitting balance - Comments: reliant on UE support Postural control: Posterior lean                                  Communication Communication Communication: No apparent difficulties  Cognition Arousal: Alert Behavior During Therapy: WFL for tasks assessed/performed   PT - Cognitive impairments: No apparent impairments                         Following commands: Intact      Cueing Cueing Techniques: Verbal cues  Exercises General Exercises - Lower Extremity Ankle Circles/Pumps: AROM, Both, 20 reps Quad Sets: AROM, Both, 10 reps Heel Slides: AAROM, Both, 10 reps Hip ABduction/ADduction: AAROM, Both, 10 reps    General Comments General comments (skin integrity, edema, etc.): B Lower leg wounds present, warm to touch and edematous      Pertinent Vitals/Pain Pain Assessment Pain Assessment: Faces Faces Pain Scale: Hurts even more Pain Location: B LEs Pain Descriptors / Indicators: Tender, Grimacing, Guarding, Moaning Pain Intervention(s): Limited activity within patient's tolerance    Home Living  Prior Function            PT Goals (current goals can now be found in the care plan section) Acute Rehab PT Goals Patient Stated Goal: less pain. return to ALF    Frequency    Min 2X/week      PT Plan      Co-evaluation              AM-PAC PT "6 Clicks" Mobility   Outcome Measure  Help needed turning from your back to your side while in a flat bed without using bedrails?: A Lot Help needed moving from lying on your back to sitting on the side of a flat bed without using bedrails?: A Lot Help needed moving to and from a bed to a chair (including a wheelchair)?: Total Help needed standing up from a chair using your arms (e.g., wheelchair or bedside chair)?: Total Help needed to walk in hospital room?: Total Help  needed climbing 3-5 steps with a railing? : Total 6 Click Score: 8    End of Session   Activity Tolerance: Patient limited by fatigue;Patient limited by pain Patient left: in bed;with call bell/phone within reach;with bed alarm set Nurse Communication: Mobility status PT Visit Diagnosis: Muscle weakness (generalized) (M62.81);History of falling (Z91.81) Pain - part of body: Leg     Time: 1222-1236 PT Time Calculation (min) (ACUTE ONLY): 14 min  Charges:    $Therapeutic Exercise: 8-22 mins PT General Charges $$ ACUTE PT VISIT: 1 Visit                    Zadie Cleverly, PTA  Jannet Askew 09/13/2023, 12:44 PM

## 2023-09-13 NOTE — Progress Notes (Signed)
 PHARMACY - ANTICOAGULATION CONSULT NOTE  Pharmacy Consult for  Eliquis --> heparin  Indication: hx atrial fibrillation  Allergies  Allergen Reactions   Altace [Ramipril] Other (See Comments)    Dizziness, migraine, weakness- "there is dye in it"   Codeine Itching   Metoprolol Tartrate Other (See Comments)    Dropped the B/P too low- eventually stopped by a MD   Naproxen Itching    Patient Measurements: Height: 6\' 1"  (185.4 cm) Weight: 102 kg (224 lb 13.9 oz) IBW/kg (Calculated) : 79.9 Heparin Dosing Weight: 81 kg  Vital Signs: Temp: 98.3 F (36.8 C) (03/26 0523) Temp Source: Oral (03/26 0523) BP: 114/83 (03/26 0523) Pulse Rate: 65 (03/26 0013)  Labs: Recent Labs    09/11/23 0419 09/11/23 1245 09/11/23 1431 09/12/23 0719 09/12/23 0722 09/13/23 0518  HGB 6.7*  --  7.7*  --  7.7* 7.5*  HCT 23.0*  --  25.9*  --  26.9* 26.3*  PLT 353  --   --   --  342 353  APTT 99* 71*  --  76*  --  47*  HEPARINUNFRC  --   --   --  >1.10*  --  0.96*  CREATININE 2.28*  --   --  2.22*  --  2.08*    Estimated Creatinine Clearance: 39.7 mL/min (A) (by C-G formula based on SCr of 2.08 mg/dL (H)).  Medications:  - on Eliquis 5 mg bid PTA   Assessment: Patient is a 73 y.o M with hx CKD, PVD, bilateral venous stasis disease and afib on Eliquis PTA who presented to the ED on 09/01/23 with worsening of left leg wound/pain sustained after an injury at the nursing facility. He was started on abx for wound infection and Eliquis resumed on admission. ABIs performed on 09/05/22, but they were unable to fully do ABI of the left leg due to pt's inability to keep still. Pharmacy has been consulted to change anticoag. to heparin drip on 09/09/23 in anticipation for angiography on 09/12/23 to evaluate blood flow to his left leg.   Today, 09/13/2023: aPTT 47 seconds>  subtherapeutic after heparin drip resumed at rate of 800 units/hr at 2030 yesterday after aortogram Heparin level 0.96 - elevated d/t PTA  Eliquis Hg 7.5.  1 unit PRBCs transfused on 3/24 PLTs WNL 3/24 PM large amount of bleeding from IV site infusing heparin, see RN note.  Pressure held 15-20 minutes. Heparin drip off 2030- 2206\ 3/25 No bleeding at IV site per RN this morning, no other bleeding noted,heparin off for aortogram 11 am> resumed 2030 3/26 no bleeding reported> to transition back to home apixaban   Goal of Therapy:  Heparin level 0.3-0.7 units/ml aPTT 66-102 seconds Monitor platelets by anticoagulation protocol: Yes   Plan:  DC heparin drip at 10 am & resume home apixaban 5 mg po bid DC labs for heparin    Herby Abraham, Pharm.D Use secure chat for questions 09/13/2023 7:56 AM

## 2023-09-13 NOTE — Progress Notes (Signed)
 Pt HR ranges from 109-140's all night on call Nelia Shi made aware via secure chat.

## 2023-09-13 NOTE — Progress Notes (Signed)
 Patient ID: Jordan [name], male   DOB: May 31, 1951, 73 y.o.   MRN: [id number] S: No new complaints O:BP 114/83 (BP Location: Right Wrist)   Pulse 65   Temp 98.3 F (36.8 C) (Oral)   Resp 18   Ht 6\' 1"  (1.854 m)   Wt 102 kg   SpO2 94%   BMI 29.67 kg/m   Intake/Output Summary (Last 24 hours) at 09/13/2023 1331 Last data filed at 09/13/2023 1000 Gross per 24 hour  Intake 252.72 ml  Output 400 ml  Net -147.28 ml   Intake/Output: I/O last 3 completed shifts: In: 901.1 [P.O.:620; I.V.:281.1] Out: 1475 [Urine:1475]  Intake/Output this shift:  Total I/O In: -  Out: 100 [Urine:100] Weight change: 2.4 kg Gen: NAD CVS: RRR Resp:CTA  Abd:+BS, soft, NT/ND Ext: right arm with erythema and swelling, bilateral lower ext ulceration, chronic venous stasis changes  Recent Labs  Lab 09/07/23 0454 09/08/23 0803 09/09/23 1022 09/10/23 0457 09/11/23 0419 09/12/23 0719 09/13/23 0518  NA 132* 135 135 137 139 137 137  K 4.6 4.4 3.9 3.8 3.7 3.4* 3.8  CL 103 104 104 101 99 96* 98  CO2 21* 21* 23 26 29 31 28   GLUCOSE 90 88 155* 91 88 94 94  BUN 58* 59* 60* 57* 62* 64* 61*  CREATININE 2.61* 2.46* 2.22* 2.22* 2.28* 2.22* 2.08*  ALBUMIN  --   --  2.0* 2.1* 2.8* 2.5* 2.6*  CALCIUM 7.6* 7.8* 7.9* 8.0* 8.3* 8.1* 8.3*  PHOS  --   --  4.6 4.7* 4.9* 4.7* 4.2   Liver Function Tests: Recent Labs  Lab 09/11/23 0419 09/12/23 0719 09/13/23 0518  ALBUMIN 2.8* 2.5* 2.6*   No results for input(s): "LIPASE", "AMYLASE" in the last 168 hours. No results for input(s): "AMMONIA" in the last 168 hours. CBC: Recent Labs  Lab 09/09/23 1639 09/10/23 0503 09/11/23 0419 09/11/23 1431 09/12/23 0722 09/13/23 0518  WBC 8.7 8.6 8.0  --  9.2 9.9  HGB 7.5* 7.5* 6.7* 7.7* 7.7* 7.5*  HCT 26.5* 27.1* 23.0* 25.9* 26.9* 26.3*  MCV 76.8* 78.3* 75.9*  --  78.2* 79.7*  PLT 279 294 353  --  342 353   Cardiac Enzymes: No results for input(s): "CKTOTAL", "CKMB", "CKMBINDEX", "TROPONINI" in the last 168  hours. CBG: Recent Labs  Lab 09/12/23 2026  GLUCAP 105*    Iron Studies: No results for input(s): "IRON", "TIBC", "TRANSFERRIN", "FERRITIN" in the last 72 hours. Studies/Results: PERIPHERAL VASCULAR CATHETERIZATION Result Date: 09/12/2023 Table formatting from the original result was not included. Images from the original result were not included.   Patient name: Jordan [name]     MRN: [id number]        DOB: 01-Aug-1950          Sex: male  09/12/2023 Pre-operative Diagnosis: Left leg ulcer Post-operative diagnosis:  Same Surgeon:  Durene Cal Procedure Performed:            1.  Ultrasound-guided access, right femoral artery            2.  Abdominal gram with CO2            3.  Selective images with catheter in left popliteal artery            4.  Left leg angiogram            5.  Conscious sedation, 31 minutes            6.  Closure device, Celt  Indications: This is a 73 year old gentleman with nonhealing wounds on both legs left worse than the right.  He had ABIs that suggested monophasic waveforms.  He is here for further evaluation.  He does have renal insufficiency and so contrast will be minimized.  Procedure:  The patient was identified in the holding area and taken to room 8.  The patient was then placed supine on the table and prepped and draped in the usual sterile fashion.  A time out was called.  Conscious sedation was administered with the use of IV fentanyl and Versed under continuous physician and nurse monitoring.  Heart rate, blood pressure, and oxygen saturation were continuously monitored.  Total sedation time was 31 minutes.  Ultrasound was used to evaluate the right common femoral artery.  It was patent .  A digital ultrasound image was acquired.  A micropuncture needle was used to access the right common femoral artery under ultrasound guidance.  An 018 wire was advanced without resistance and a micropuncture sheath was placed.  The 018 wire was removed and a benson  wire was placed.  The micropuncture sheath was exchanged for a 5 french sheath.  An omniflush catheter was advanced over the wire to the level of L-1.  An abdominal angiogram with CO2 was obtained.  Next, using the omniflush catheter and a benson wire, the aortic bifurcation was crossed and the catheter was placed into theleft external iliac artery and left runoff was obtained.  Additional images were performed with a catheter in the left popliteal artery from the right groin  Findings:            Aortogram: No evidence of renal artery stenosis.  Due to bowel gas, the abdominal aorta was not well-evaluated.  There did not appear to be any significant stenosis.  Bilateral common and external iliac arteries were widely patent.            Right Lower Extremity: Not evaluated             Left Lower Extremity: The left common femoral, profundofemoral, and superficial femoral artery are widely patent.  The popliteal artery is widely patent.  The patient has three-vessel runoff.  The dominant vessel across the ankle is the anterior tibial.  There is mild stenosis in the mid posterior tibial artery, however this does not have significant perfusion of the forefoot.  Intervention: No intervention was performed as the patient should have adequate blood flow to heal his wounds.  Impression:             #1  No significant aortoiliac occlusive disease             #2  No significant left femoral-popliteal stenosis             #3  Anterior tibial is a dominant vessel across the ankle perfusing the forefoot.  The posterior tibial does have approximately a 50% lesion in its midportion however this does not contribute much to the blood flow out onto the foot.  The patient should have adequate blood flow for wound healing             #4  Recommend outpatient wound follow-up with Dr. Kandis Mannan. Durene Cal, M.D., [facility] [place] Office: [phone] Pager:  [phone]       apixaban  5 mg Oral BID    atorvastatin  80 mg Oral QODAY   LORazepam  1 mg Oral BID  mometasone-formoterol  2 puff Inhalation BID   multivitamin with minerals  1 tablet Oral Q breakfast   pantoprazole  40 mg Oral Daily   sodium chloride flush  3 mL Intravenous Q12H   venlafaxine XR  75 mg Oral Q breakfast    BMET    Component Value Date/Time   NA 137 09/13/2023 0518   NA 139 07/02/2021 1322   K 3.8 09/13/2023 0518   CL 98 09/13/2023 0518   CO2 28 09/13/2023 0518   GLUCOSE 94 09/13/2023 0518   BUN 61 (H) 09/13/2023 0518   BUN 26 07/02/2021 1322   CREATININE 2.08 (H) 09/13/2023 0518   CALCIUM 8.3 (L) 09/13/2023 0518   GFRNONAA 33 (L) 09/13/2023 0518   GFRAA 65 07/23/2020 1533   CBC    Component Value Date/Time   WBC 9.9 09/13/2023 0518   RBC 3.30 (L) 09/13/2023 0518   HGB 7.5 (L) 09/13/2023 0518   HGB 11.0 (L) 07/02/2021 1322   HCT 26.3 (L) 09/13/2023 0518   HCT 35.0 (L) 07/02/2021 1322   PLT 353 09/13/2023 0518   PLT 266 07/02/2021 1322   MCV 79.7 (L) 09/13/2023 0518   MCV 81 07/02/2021 1322   MCH 22.7 (L) 09/13/2023 0518   MCHC 28.5 (L) 09/13/2023 0518   RDW 23.1 (H) 09/13/2023 0518   RDW 15.3 07/02/2021 1322   LYMPHSABS 1.2 09/04/2023 0407   MONOABS 0.8 09/04/2023 0407   EOSABS 0.4 09/04/2023 0407   BASOSABS 0.0 09/04/2023 0407    Assessment/ Plan: AKI: b/l creat is 1.3, from PCP's office in Jan 2024. Assuming this is AKI but could also have CKD. UA shows some rbc's/ wbc's, no proteinuria. Renal US w/o obstruction. BP's were soft in 90s on presentation and then improved after IV abx and IVF's, but pt developed pulm edema on CXR #2 w/ bilat LE edema. Suspect CKD and/or ATN / vol overload as cause of renal failure. Started IV lasix 3/19, titrating up the dose and the UOP is improving. 4.4 L UOP snd creat stable in low 2's. Cont diuresis. He dropped his Bp on the afternoon of 3/23 so Lasix was decreased to 80 mg IV tid.  Zaroxolyn on hold.  Bp was low again 09/11/23 and IV lasix was stopped.   Good UOP and continue to follow for now without lasix.  He did have a renal CO2 angiogram which was negative for renal artery stenosis.   Soft tissue infection: SP IV abx, is now on po keflex Left leg ulcer - normal left leg angiogram Anemia - hgb down to 6.7 s/p PRBC and now 7.5.  per primary COPD Atrial fib S/p TAVR Chronic resp failure - on home O2 2 L HTN - bp's low, doesn't need atenolol now, will dc.   Irena Cords, MD [facility]

## 2023-09-13 NOTE — Progress Notes (Signed)
 OT Cancellation Note  Patient Details Name: Jordan [name] MRN: [id number] DOB: July 26, 1950   Cancelled Treatment:    Reason Eval/Treat Not Completed: Patient declined, no reason specified. OT will follow up next available time  Galen Manila 09/13/2023, 2:10 PM

## 2023-09-13 NOTE — Progress Notes (Signed)
 PROGRESS NOTE  Jordan [name]  HQI:[id number] DOB: 02-24-1951 DOA: 09/01/2023 PCP: Kennith Center, NP  Consultants  Brief Narrative: 73 year old with past medical history significant for hypertension, Heart failure preserved ejection fraction, A-fib, CAD, COPD with chronic hypoxic respiratory failure on 2 L of oxygen, chronic venous stasis, PVD admitted 3/17 for worsening cellulitis failing outpatient antibiotics, after injury at nursing home previous week.    Hospital course complicated by AKI on CKD, pulmonary edema, hypotension required IV albumin and discontinuation of atenolol.  Due to concern for peripheral vascular disease and lower extremity wounds he underwent arteriogram by Dr. Myra Gianotti on 3/25, which showed no significant aortoiliac occlusive disease or femoropopliteal disease, anterior tibial dominant vessel perfusing to the ankle and forefoot.  Posterior tibial has 50% lesion midportion however does not contribute much to the blood flow.  Patient she will have adequate blood flow for wound healing.     Assessment & Plan: Principal Problem:   Soft tissue infection Active Problems:   Persistent atrial fibrillation (HCC)   Essential hypertension   Chronic heart failure with preserved ejection fraction (HCC)   COPD (chronic obstructive pulmonary disease) (HCC)   Chronic respiratory failure with hypoxia (HCC)   HLD (hyperlipidemia)   Varicose veins of bilateral lower extremities with other complications   Stage 3 chronic kidney disease (HCC)     1-Soft tissue infection, LE infection, ulcers.  Chronic venous stasis disease PVD -ABI: Right: Resting right ankle-brachial index is within normal range. Unable to obtain TBI due to great toe ulceration.  Left:  Unable to obtain ABI due to patient pain tolerance and movement.  Monophasic waveforms are detected in the dorsalis pedis and posterior  tibial arteries.  Unable to obtain TBI due to great toe ulceration.  -Continue local care.   -Completed 10 days Keflex, He will complete 5 days of Doxy on 3/25.  Abx now finished. -Vascular consulted. Underwent arteriogram 3/25, no insignificant PVD, has adequate blood flow for wound healing.  -Redness less intense. Needs to follow up with Dr Lajoyce Corners out patient.    AKI, on ? CKD -Previous cr 0.9 (2023) -Creatinine peak  at 2.6.  Today now at 2.08.  Unclear what his new baseline will be.   -Renal ultrasound obtained negative for hydronephrosis -Nephrology consulted, managing diuresis. Appreciate input.  Not on any diuretics currently.   - Follow I/Os  Hypokalemia: Replete.    Hypertension://Hypotension.  Holding  atenolol to avoid hypotension Had soft BP, lasix held.  Received IV albumin.    A-fib: Restarted eliquis today after on heparin for procedure 3/25. Atenolol discontinue by nephrology due to hypotension.  If HR continue to be elevated, might need to start Amiodarone or could try low dose metoprolol if BP allows.    COPD Chronic hypoxic respiratory failure on 2 L of oxygen 3/19: Wheezing, started scheduled albuterol   Anxiety depression: Continue Effexor and Ativan   Anemia; iron deficiency:  Received  IV iron 3/23. Hb down to 6. Received  one unit PRBC> 3/24. No evidence of active bleeding.  Hb 7.6       Pressure Injury 09/02/23 Heel Left Unstageable - Full thickness tissue loss in which the base of the injury is covered by slough (yellow, tan, gray, green or brown) and/or eschar (tan, brown or black) in the wound bed. (Active)  09/02/23 1713  Location: Heel  Location Orientation: Left  Staging: Unstageable - Full thickness tissue loss in which the base of the injury is covered by slough (  yellow, tan, gray, green or brown) and/or eschar (tan, brown or black) in the wound bed.  Wound Description (Comments):   Present on Admission: Yes  Dressing Type Foam - Lift dressing to assess site every shift 09/13/23 0948   DVT prophylaxis:   apixaban (ELIQUIS)  tablet 5 mg  Code Status:   Code Status: Full Code Level of care: Med-Surg Status is: Inpatient Dispo:  likely back to SNF soon pending creatinine improvement/ok by renal    Consults called: Neprhology   Subjective: Patient awake and alert today.  Feels very well.  Without any complaints.  Eating and drinking well.  Objective: Vitals:   09/13/23 0401 09/13/23 0523 09/13/23 0746 09/13/23 1333  BP:  114/83  (!) 116/55  Pulse:    89  Resp:  18  18  Temp:  98.3 F (36.8 C)  98.5 F (36.9 C)  TempSrc:  Oral  Oral  SpO2:  90% 94% 100%  Weight: 102 kg     Height:        Intake/Output Summary (Last 24 hours) at 09/13/2023 1535 Last data filed at 09/13/2023 1400 Gross per 24 hour  Intake 852.72 ml  Output 600 ml  Net 252.72 ml   Filed Weights   09/09/23 0500 09/12/23 0500 09/13/23 0401  Weight: 105.3 kg 99.6 kg 102 kg   Body mass index is 29.67 kg/m.  Gen: 73 y.o. male in no apparent distress.  Nontoxic Pulm: Non-labored breathing.  Clear to auscultation bilaterally.  CV: Regular rate and rhythm. No murmur, rub, or gallop. No JVD GI: Abdomen soft, non-tender, non-distended, with normoactive bowel sounds Extremities: BL LE ulceration, toe with ulceration LE with less intense  redness. Hyperpigmentation BL changes consistent with chronic venous insufficiency.  Neuro: Alert and oriented. No focal neurological deficits. Psych: Calm  Judgement and insight appear normal. Mood & affect appropriate.     I have personally reviewed the following labs and images: CBC: Recent Labs  Lab 09/09/23 1639 09/10/23 0503 09/11/23 0419 09/11/23 1431 09/12/23 0722 09/13/23 0518  WBC 8.7 8.6 8.0  --  9.2 9.9  HGB 7.5* 7.5* 6.7* 7.7* 7.7* 7.5*  HCT 26.5* 27.1* 23.0* 25.9* 26.9* 26.3*  MCV 76.8* 78.3* 75.9*  --  78.2* 79.7*  PLT 279 294 353  --  342 353   BMP &GFR Recent Labs  Lab 09/09/23 1022 09/10/23 0457 09/11/23 0419 09/12/23 0719 09/13/23 0518  NA 135 137 139 137 137  K  3.9 3.8 3.7 3.4* 3.8  CL 104 101 99 96* 98  CO2 23 26 29 31 28   GLUCOSE 155* 91 88 94 94  BUN 60* 57* 62* 64* 61*  CREATININE 2.22* 2.22* 2.28* 2.22* 2.08*  CALCIUM 7.9* 8.0* 8.3* 8.1* 8.3*  PHOS 4.6 4.7* 4.9* 4.7* 4.2   Estimated Creatinine Clearance: 39.7 mL/min (A) (by C-G formula based on SCr of 2.08 mg/dL (H)). Liver & Pancreas: Recent Labs  Lab 09/09/23 1022 09/10/23 0457 09/11/23 0419 09/12/23 0719 09/13/23 0518  ALBUMIN 2.0* 2.1* 2.8* 2.5* 2.6*   No results for input(s): "LIPASE", "AMYLASE" in the last 168 hours. No results for input(s): "AMMONIA" in the last 168 hours. Diabetic: No results for input(s): "HGBA1C" in the last 72 hours. Recent Labs  Lab 09/12/23 2026  GLUCAP 105*   Cardiac Enzymes: No results for input(s): "CKTOTAL", "CKMB", "CKMBINDEX", "TROPONINI" in the last 168 hours. No results for input(s): "PROBNP" in the last 8760 hours. Coagulation Profile: No results for input(s): "INR", "PROTIME" in the  last 168 hours. Thyroid Function Tests: No results for input(s): "TSH", "T4TOTAL", "FREET4", "T3FREE", "THYROIDAB" in the last 72 hours. Lipid Profile: Recent Labs    09/13/23 0518  CHOL 97  HDL 26*  LDLCALC 56  TRIG 77  CHOLHDL 3.7   Anemia Panel: No results for input(s): "VITAMINB12", "FOLATE", "FERRITIN", "TIBC", "IRON", "RETICCTPCT" in the last 72 hours. Urine analysis:    Component Value Date/Time   COLORURINE YELLOW 09/03/2023 0007   APPEARANCEUR CLEAR 09/03/2023 0007   LABSPEC 1.008 09/03/2023 0007   PHURINE 5.0 09/03/2023 0007   GLUCOSEU NEGATIVE 09/03/2023 0007   HGBUR MODERATE (A) 09/03/2023 0007   BILIRUBINUR NEGATIVE 09/03/2023 0007   KETONESUR NEGATIVE 09/03/2023 0007   PROTEINUR NEGATIVE 09/03/2023 0007   NITRITE NEGATIVE 09/03/2023 0007   LEUKOCYTESUR NEGATIVE 09/03/2023 0007   Sepsis Labs: Invalid input(s): "PROCALCITONIN", "LACTICIDVEN"  Microbiology: No results found for this or any previous visit (from the past 240  hours).  Radiology Studies: No results found.  Scheduled Meds:  apixaban  5 mg Oral BID   atorvastatin  80 mg Oral QODAY   LORazepam  1 mg Oral BID   mometasone-formoterol  2 puff Inhalation BID   multivitamin with minerals  1 tablet Oral Q breakfast   pantoprazole  40 mg Oral Daily   sodium chloride flush  3 mL Intravenous Q12H   venlafaxine XR  75 mg Oral Q breakfast   Continuous Infusions:  sodium chloride       LOS: 12 days   35 minutes with more than 50% spent in reviewing records, counseling patient/family and coordinating care.  Tobey Grim, MD [facility] [email or web address] 09/13/2023, 3:35 PM

## 2023-09-14 DIAGNOSIS — L089 Local infection of the skin and subcutaneous tissue, unspecified: Secondary | ICD-10-CM | POA: Diagnosis not present

## 2023-09-14 LAB — RENAL FUNCTION PANEL
Albumin: 2.3 g/dL — ABNORMAL LOW (ref 3.5–5.0)
Anion gap: 8 (ref 5–15)
BUN: 62 mg/dL — ABNORMAL HIGH (ref 8–23)
CO2: 29 mmol/L (ref 22–32)
Calcium: 8.3 mg/dL — ABNORMAL LOW (ref 8.9–10.3)
Chloride: 98 mmol/L (ref 98–111)
Creatinine, Ser: 2.19 mg/dL — ABNORMAL HIGH (ref 0.61–1.24)
GFR, Estimated: 31 mL/min — ABNORMAL LOW (ref >60)
Glucose, Bld: 100 mg/dL — ABNORMAL HIGH (ref 70–99)
Phosphorus: 3.9 mg/dL (ref 2.5–4.6)
Potassium: 3.7 mmol/L (ref 3.5–5.1)
Sodium: 135 mmol/L (ref 135–145)

## 2023-09-14 LAB — URINALYSIS, ROUTINE W REFLEX MICROSCOPIC
Bilirubin Urine: NEGATIVE
Glucose, UA: NEGATIVE mg/dL
Ketones, ur: NEGATIVE mg/dL
Nitrite: NEGATIVE
Protein, ur: 100 mg/dL — AB
RBC / HPF: >50 RBC/hpf (ref 0–5)
Specific Gravity, Urine: 1.013 (ref 1.005–1.030)
pH: 6 (ref 5.0–8.0)

## 2023-09-14 LAB — CBC
HCT: 26.3 % — ABNORMAL LOW (ref 39.0–52.0)
Hemoglobin: 7.6 g/dL — ABNORMAL LOW (ref 13.0–17.0)
MCH: 22.9 pg — ABNORMAL LOW (ref 26.0–34.0)
MCHC: 28.9 g/dL — ABNORMAL LOW (ref 30.0–36.0)
MCV: 79.2 fL — ABNORMAL LOW (ref 80.0–100.0)
Platelets: 349 10*3/uL (ref 150–400)
RBC: 3.32 MIL/uL — ABNORMAL LOW (ref 4.22–5.81)
RDW: 23.9 % — ABNORMAL HIGH (ref 11.5–15.5)
WBC: 9 10*3/uL (ref 4.0–10.5)
nRBC: 0 % (ref 0.0–0.2)

## 2023-09-14 LAB — SODIUM, URINE, RANDOM: Sodium, Ur: 39 mmol/L

## 2023-09-14 LAB — ANTINUCLEAR ANTIBODIES, IFA: ANA Ab, IFA: NEGATIVE

## 2023-09-14 MED ORDER — FUROSEMIDE 40 MG PO TABS
80.0000 mg | ORAL_TABLET | Freq: Every day | ORAL | Status: DC
  Administered 2023-09-14: 80 mg via ORAL
  Filled 2023-09-14: qty 2

## 2023-09-14 NOTE — Consult Note (Addendum)
 WOC Nurse wound follow up Refer to previous WOC consult note on 3/16. Re-consult performed remotely after review of progress notes and photos in the EMR. Secure chat message received from Social work; Pt is now planning to discharge to a facility where they will be unable to perform daily dressing changes.   Topical treatment orders revised for bedside nurses to perform as follows: Paint RLE wounds with betadine, allow to air dry, cover with foam Q Mon and Thurs Cleanse LLE wounds and toe wound with saline, pat dry Cover with single layer of xeroform gauze, top with foam. Change Q Mon and Thurs. Vascular consult was performed on 3/19 and they recommended the patient follow-up as an outpatient with Dr Lajoyce Corners of the ortho service.  Please re-consult if further assistance is needed.  Thank-you,  Cammie Mcgee MSN, RN, CWOCN, [place], CNS [phone]

## 2023-09-14 NOTE — Plan of Care (Signed)
  Problem: Education: Goal: Knowledge of General Education information will improve Description: Including pain rating scale, medication(s)/side effects and non-pharmacologic comfort measures Outcome: Progressing   Problem: Health Behavior/Discharge Planning: Goal: Ability to manage health-related needs will improve Outcome: Progressing   Problem: Clinical Measurements: Goal: Ability to maintain clinical measurements within normal limits will improve Outcome: Progressing Goal: Will remain free from infection Outcome: Progressing Goal: Diagnostic test results will improve Outcome: Progressing Goal: Respiratory complications will improve Outcome: Progressing Goal: Cardiovascular complication will be avoided Outcome: Progressing   Problem: Activity: Goal: Risk for activity intolerance will decrease Outcome: Progressing   Problem: Nutrition: Goal: Adequate nutrition will be maintained Outcome: Progressing   Problem: Coping: Goal: Level of anxiety will decrease Outcome: Progressing   Problem: Elimination: Goal: Will not experience complications related to bowel motility Outcome: Progressing Goal: Will not experience complications related to urinary retention Outcome: Progressing   Problem: Pain Managment: Goal: General experience of comfort will improve and/or be controlled Outcome: Progressing   Problem: Safety: Goal: Ability to remain free from injury will improve Outcome: Progressing   Problem: Skin Integrity: Goal: Risk for impaired skin integrity will decrease Outcome: Progressing   Problem: Clinical Measurements: Goal: Ability to avoid or minimize complications of infection will improve Outcome: Progressing   Problem: Skin Integrity: Goal: Skin integrity will improve Outcome: Progressing   Problem: Education: Goal: Understanding of CV disease, CV risk reduction, and recovery process will improve Outcome: Progressing Goal: Individualized Educational  Video(s) Outcome: Progressing   Problem: Activity: Goal: Ability to return to baseline activity level will improve Outcome: Progressing   Problem: Cardiovascular: Goal: Ability to achieve and maintain adequate cardiovascular perfusion will improve Outcome: Progressing Goal: Vascular access site(s) Level 0-1 will be maintained Outcome: Progressing   Problem: Health Behavior/Discharge Planning: Goal: Ability to safely manage health-related needs after discharge will improve Outcome: Progressing

## 2023-09-14 NOTE — TOC Progression Note (Signed)
 Transition of Care [facility]) - Progression Note    Patient Details  Name: Jordan [name] MRN: [id number] Date of Birth: 1950/06/27  [facility] CM/SW Contact  Amada Jupiter, LCSW Phone Number: 09/14/2023, 1:30 PM  Clinical Narrative:     Met with pt today to review dc plans as it is anticipated that he is now medically ready for dc.  Reviewed with pt that plan had been for SNF and bed accepted at [facility], however, pt no longer agreeable to this plan.  He insists he wants to return to his ALF "because I'm paying them a lot of money."  Attempted to explain that his care needs were felt to be too extensive for the ALF to manage.  Again, pt very insistent that CSW reach out to them again and discuss his return.   Have spoken with Elouise Munroe, RN at [facility], who understands pt's request and will review current care needs of patient and see if return to ALF can be reconsidered.  Have alerted MD/ RN and therapy to case status.  Will send current progress notes and wound care orders for Ms. Alberteen Spindle to review.  Expected Discharge Plan: Assisted Living Barriers to Discharge: Continued Medical Work up  Expected Discharge Plan and Services In-house Referral: Clinical Social Work     Living arrangements for the past 2 months: Assisted Living Facility                                       Social Determinants of Health (SDOH) Interventions SDOH Screenings   Food Insecurity: Patient Declined (09/01/2023)  Housing: Patient Declined (09/01/2023)  Transportation Needs: Patient Declined (09/01/2023)  Utilities: Patient Declined (09/01/2023)  Social Connections: Patient Declined (09/01/2023)  Tobacco Use: High Risk (09/01/2023)    Readmission Risk Interventions    09/04/2023    2:49 PM 04/04/2022   11:02 AM  Readmission Risk Prevention Plan  Transportation Screening Complete Complete  PCP or Specialist Appt within 5-7 Days Complete   PCP or Specialist Appt within 3-5  Days  Complete  Home Care Screening Complete   Medication Review (RN CM) Complete   HRI or Home Care Consult  Complete  Social Work Consult for Recovery Care Planning/Counseling  Complete  Palliative Care Screening  Not Applicable  Medication Review Oceanographer)  Complete

## 2023-09-14 NOTE — Progress Notes (Signed)
 Occupational Therapy Treatment Patient Details Name: Jordan [name] MRN: [id number] DOB: 29-Aug-1950 Today's Date: 09/14/2023   History of present illness 73 yo male admitted with soft tissue infection/L LE wound, L toe laceration after sustaining a fall at ALF while be transferred back to bed per pt report. Hx of TAVR, bil LE edema, R TKA 07/2021, DM, Afib, anemia, anxiety, CHF, COPD-on O2 PRN per pt, obesity, aortic stenosis, venous insufficiency. As of 09/14/23 pt endorses that he has not yet been out of bed. Pt reports "These women" coming into room at 4:00AM for banadge changes and pt too fatigued for rest of day to do anything.   OT comments  First time OT seeing this pleasant patient who expressed determination to go back to [facility]. Pt did agree to mobilize and reports that he has not stood up since his admission and he normally stands and pivots EOB<>WC with light assistance at [facility].   Pt required Max As for supine to sit and to stands to [employer] x 2 reps from elevated EOB with inability to tuck hips forward sufficiently to engage Stedy flap-seats and <10 second standing tolerance each rep. Pt required Max As of 2 to return to supine.  Pt unable to scoot forward to EOB without Total as and use of draw sheet. Unable to scoot laterally.   Pt continues to demonstrate good rehab potential, but tends to refuse necessary therapy and needs MAX encouragement and education on importance of him participating ad lib.  This OT did address this with pt and answered pt's questions about SNF and pt agreeable for plan to try for [facility] and to go to SNF if [facility] insists.   Pt would likely benefit from continued skilled OT to increase safety and independence with ADLs and functional transfers to allow pt to return home safely and reduce caregiver burden and fall risk.       If plan is discharge home, recommend the following:  Two people to help with walking and/or transfers;Two  people to help with bathing/dressing/bathroom;Assist for transportation;Direct supervision/assist for medications management;Help with stairs or ramp for entrance   Equipment Recommendations  None recommended by OT    Recommendations for Other Services      Precautions / Restrictions Precautions Precautions: Fall Precaution/Restrictions Comments: multiple LE wounds-tender to touch; chronic 2L baseline Restrictions Weight Bearing Restrictions Per Provider Order: No       Mobility Bed Mobility Overal bed mobility: Needs Assistance Bed Mobility: Supine to Sit, Sit to Supine     Supine to sit: HOB elevated, Used rails, Max assist Sit to supine: Max assist, +2 for physical assistance, HOB elevated        Transfers Overall transfer level: Needs assistance   Transfers: Sit to/from Stand Sit to Stand: From elevated surface (x2 reps. See ADL section for more details)             Transfer via Lift Equipment: Stedy   Balance Overall balance assessment: Needs assistance Sitting-balance support: Bilateral upper extremity supported, Feet supported Sitting balance-Leahy Scale: Poor Sitting balance - Comments: reliant on UE support   Standing balance support: No upper extremity supported, During functional activity, Reliant on assistive device for balance Standing balance-Leahy Scale: Zero                             ADL either performed or assessed with clinical judgement   ADL Overall ADL's :  Needs assistance/impaired Eating/Feeding: Sitting;Bed level;Minimal assistance Eating/Feeding Details (indicate cue type and reason): Pt requires food be cut for him. Grooming: Wash/dry hands;Supervision/safety;Bed level Grooming Details (indicate cue type and reason): Pt used hand gel before his meal. Declined hand washing. Upper Body Bathing: Maximal assistance;Bed level   Lower Body Bathing: Total assistance;Bed level Lower Body Bathing Details (indicate cue type and  reason): Pt reports this is his baseline Upper Body Dressing : Maximal assistance;Sitting;Cueing for sequencing;Cueing for compensatory techniques;Cueing for safety   Lower Body Dressing: Total assistance;Bed level Lower Body Dressing Details (indicate cue type and reason): Pt reports this is his baseline at [facility]. Toilet Transfer: Maximal Dentist Details (indicate cue type and reason): Pt stood x 2 reps from elevated EOB to Eva with Max As of 1 person, increased time and failure to stand fully upright enough to deploy stedy flap for seat. Pt required RUE assist to reach bar and partially elevated EOB. Cues, increased time/effort and under 10 second tolerance to stand. Toileting- Clothing Manipulation and Hygiene: Total assistance;Bed level       Functional mobility during ADLs: Maximal assistance;Cueing for safety;Cueing for sequencing      Extremity/Trunk Assessment Upper Extremity Assessment Upper Extremity Assessment: Right hand dominant;LUE deficits/detail;RUE deficits/detail RUE Deficits / Details: Extremely limited shoulder ROM and arthrtiic looking GH joint, anterior placement. Unable to actively move upper arm away from body for FF or latearl ABD. Pt endorses UEs are weaker now than prior to hospitalization. LUE Deficits / Details: Marginally stronger than the RUE but still severely weak with limited active shoulder ROM. Pt favors LT hand for tasks due to this despite being RT handed.            Vision       Perception     Praxis     Communication Communication Communication: No apparent difficulties   Cognition Arousal: Alert Behavior During Therapy: WFL for tasks assessed/performed               OT - Cognition Comments: Decreased awareness of deficits. Seems to have unrealistic expectations of abilities.                 Following commands: Intact (When pt wants to.)        Cueing   Cueing Techniques: Verbal cues   Exercises      Shoulder Instructions       General Comments      Pertinent Vitals/ Pain       Pain Assessment Pain Assessment: 0-10 Pain Score: 5  Pain Location: B LEs Pain Descriptors / Indicators: Tender, Moaning Pain Intervention(s): Limited activity within patient's tolerance, Monitored during session, Repositioned  Home Living                                          Prior Functioning/Environment              Frequency  Min 1X/week        Progress Toward Goals  OT Goals(current goals can now be found in the care plan section)  Progress towards OT goals: Progressing toward goals  Acute Rehab OT Goals Patient Stated Goal: Go back to [facility] OT Goal Formulation: With patient Time For Goal Achievement: 09/20/23 Potential to Achieve Goals: Good  Plan      Co-evaluation  AM-PAC OT "6 Clicks" Daily Activity     Outcome Measure   Help from another person eating meals?: A Little Help from another person taking care of personal grooming?: A Little Help from another person toileting, which includes using toliet, bedpan, or urinal?: Total Help from another person bathing (including washing, rinsing, drying)?: Total Help from another person to put on and taking off regular upper body clothing?: A Lot Help from another person to put on and taking off regular lower body clothing?: Total 6 Click Score: 11    End of Session Equipment Utilized During Treatment: Gait belt;Oxygen;Other (comment) Antony Salmon)  OT Visit Diagnosis: Other abnormalities of gait and mobility (R26.89);Muscle weakness (generalized) (M62.81);Pain Pain - Right/Left:  (b) Pain - part of body: Leg   Activity Tolerance Patient limited by pain;Patient limited by fatigue   Patient Left in bed;with call bell/phone within reach;with bed alarm set   Nurse Communication Mobility status        Time: 0109-3235 OT Time Calculation (min): 47  min  Charges: OT General Charges $OT Visit: 1 Visit OT Treatments $Self Care/Home Management : 8-22 mins $Therapeutic Activity: 23-37 mins  Victorino Dike, OT Acute Rehab Services Office: [phone] 09/14/2023   Theodoro Clock 09/14/2023, 3:21 PM

## 2023-09-14 NOTE — Progress Notes (Signed)
 PROGRESS NOTE  COLONEL KRAUSER  ZOX:[id number] DOB: July 27, 1950 DOA: 09/01/2023 PCP: Kennith Center, NP  Consultants  Brief Narrative: 73 year old with past medical history significant for hypertension, Heart failure preserved ejection fraction, A-fib, CAD, COPD with chronic hypoxic respiratory failure on 2 L of oxygen, chronic venous stasis, PVD admitted 3/17 for worsening cellulitis failing outpatient antibiotics, after injury at nursing home previous week.    Hospital course complicated by AKI on CKD, pulmonary edema, hypotension required IV albumin and discontinuation of atenolol.  Due to concern for peripheral vascular disease and lower extremity wounds he underwent arteriogram by Dr. Myra Gianotti on 3/25, which showed no significant aortoiliac occlusive disease or femoropopliteal disease, anterior tibial dominant vessel perfusing to the ankle and forefoot.  Should  have adequate blood flow for wound healing.     Assessment & Plan:  AKI, on ? CKD -Now main thing keeping him in the hospital.  -Creatinine essentially unchanged for several days. -Has been admitted with baseline urine output 3 L. -Based on I/Os, urine output was 1.2 L on 3/25, 600 cc 3/26.   -Urine in Foley now very dark, to the point where I thought we actually had some blood - Wonder if we have over-diuresed him, or he is just intravascularly dry based on UOP and urine in foley - appreciate renal weighing in on next steps.    Soft tissue infection, LE infection, ulcers.  Chronic venous stasis disease PVD -Continue local care.  -Completed 10 days Keflex, He will complete 5 days of Doxy on 3/25.  Abx now finished. -Vascular consulted. Underwent arteriogram 3/25, no insignificant PVD, has adequate blood flow for wound healing.  -Redness less intense. Needs to follow up with Dr Lajoyce Corners out patient.   Hypokalemia:  Resolved.    Hypertension://Hypotension.  Holding  atenolol to avoid hypotension Had soft BP, lasix held.   Received IV albumin. Vitals good currently.   A-fib: Restarted eliquis 3/26 after on heparin for procedure 3/25. Atenolol discontinue by nephrology due to hypotension.  If HR continue to be elevated, might need to start Amiodarone or could try low dose metoprolol if BP allows.    COPD Chronic hypoxic respiratory failure on 2 L of oxygen 3/19: Wheezing, started scheduled albuterol   Anxiety depression: Continue Effexor and Ativan   Anemia; iron deficiency:  Received  IV iron 3/23. Hb down to 6. Received  one unit PRBC> 3/24. No evidence of active bleeding.  Hb 7.6       Pressure Injury 09/02/23 Heel Left Unstageable - Full thickness tissue loss in which the base of the injury is covered by slough (yellow, tan, gray, green or brown) and/or eschar (tan, brown or black) in the wound bed. (Active)  09/02/23 1713  Location: Heel  Location Orientation: Left  Staging: Unstageable - Full thickness tissue loss in which the base of the injury is covered by slough (yellow, tan, gray, green or brown) and/or eschar (tan, brown or black) in the wound bed.  Wound Description (Comments):   Present on Admission: Yes  Dressing Type Foam - Lift dressing to assess site every shift 09/14/23 0745   DVT prophylaxis:  apixaban (ELIQUIS) tablet 5 mg  Code Status:   Code Status: Full Code Level of care: Med-Surg Status is: Inpatient Dispo:  back to SNF soon pending creatinine improvement/ok by renal    Consults called: Nephrology   Subjective: Patient awake and alert today.  Without any complaints.  Eating and drinking well.  Sitting up in bed.  Objective: Vitals:   09/13/23 2001 09/14/23 0400 09/14/23 0815 09/14/23 0818  BP:  110/61    Pulse:  92    Resp:  16    Temp:  98.3 F (36.8 C)    TempSrc:  Oral    SpO2: 92% 93% (S) (!) 86% 92%  Weight:      Height:        Intake/Output Summary (Last 24 hours) at 09/14/2023 1101 Last data filed at 09/14/2023 1610 Gross per 24 hour  Intake  720 ml  Output 1025 ml  Net -305 ml   Filed Weights   09/09/23 0500 09/12/23 0500 09/13/23 0401  Weight: 105.3 kg 99.6 kg 102 kg   Body mass index is 29.67 kg/m.  Gen: 73 y.o. male in no apparent distress.  Nontoxic Pulm: Non-labored breathing.  Clear to auscultation bilaterally.  CV: Regular rate and rhythm. No murmur, rub, or gallop. No JVD GI: Abdomen soft, non-tender, non-distended, with normoactive bowel sounds Extremities: BL LE ulceration, toe with ulceration LE with less intense  redness. Hyperpigmentation BL changes consistent with chronic venous insufficiency.  Slowly healing. GU: Condom cath in place.  Notably dark urine in catheter tubing as well as Foley bag, minimal urine output in bag. Neuro: Alert and oriented. No focal neurological deficits. Psych: Calm  Judgement and insight appear normal. Mood & affect appropriate.     I have personally reviewed the following labs and images: CBC: Recent Labs  Lab 09/10/23 0503 09/11/23 0419 09/11/23 1431 09/12/23 0722 09/13/23 0518 09/14/23 0450  WBC 8.6 8.0  --  9.2 9.9 9.0  HGB 7.5* 6.7* 7.7* 7.7* 7.5* 7.6*  HCT 27.1* 23.0* 25.9* 26.9* 26.3* 26.3*  MCV 78.3* 75.9*  --  78.2* 79.7* 79.2*  PLT 294 353  --  342 353 349   BMP &GFR Recent Labs  Lab 09/10/23 0457 09/11/23 0419 09/12/23 0719 09/13/23 0518 09/14/23 0450  NA 137 139 137 137 135  K 3.8 3.7 3.4* 3.8 3.7  CL 101 99 96* 98 98  CO2 26 29 31 28 29   GLUCOSE 91 88 94 94 100*  BUN 57* 62* 64* 61* 62*  CREATININE 2.22* 2.28* 2.22* 2.08* 2.19*  CALCIUM 8.0* 8.3* 8.1* 8.3* 8.3*  PHOS 4.7* 4.9* 4.7* 4.2 3.9   Estimated Creatinine Clearance: 37.7 mL/min (A) (by C-G formula based on SCr of 2.19 mg/dL (H)). Liver & Pancreas: Recent Labs  Lab 09/10/23 0457 09/11/23 0419 09/12/23 0719 09/13/23 0518 09/14/23 0450  ALBUMIN 2.1* 2.8* 2.5* 2.6* 2.3*   No results for input(s): "LIPASE", "AMYLASE" in the last 168 hours. No results for input(s): "AMMONIA" in the  last 168 hours. Diabetic: No results for input(s): "HGBA1C" in the last 72 hours. Recent Labs  Lab 09/12/23 2026  GLUCAP 105*   Cardiac Enzymes: No results for input(s): "CKTOTAL", "CKMB", "CKMBINDEX", "TROPONINI" in the last 168 hours. No results for input(s): "PROBNP" in the last 8760 hours. Coagulation Profile: No results for input(s): "INR", "PROTIME" in the last 168 hours. Thyroid Function Tests: No results for input(s): "TSH", "T4TOTAL", "FREET4", "T3FREE", "THYROIDAB" in the last 72 hours. Lipid Profile: Recent Labs    09/13/23 0518  CHOL 97  HDL 26*  LDLCALC 56  TRIG 77  CHOLHDL 3.7   Anemia Panel: No results for input(s): "VITAMINB12", "FOLATE", "FERRITIN", "TIBC", "IRON", "RETICCTPCT" in the last 72 hours. Urine analysis:    Component Value Date/Time   COLORURINE YELLOW 09/03/2023 0007   APPEARANCEUR CLEAR 09/03/2023 0007   LABSPEC  1.008 09/03/2023 0007   PHURINE 5.0 09/03/2023 0007   GLUCOSEU NEGATIVE 09/03/2023 0007   HGBUR MODERATE (A) 09/03/2023 0007   BILIRUBINUR NEGATIVE 09/03/2023 0007   KETONESUR NEGATIVE 09/03/2023 0007   PROTEINUR NEGATIVE 09/03/2023 0007   NITRITE NEGATIVE 09/03/2023 0007   LEUKOCYTESUR NEGATIVE 09/03/2023 0007   Sepsis Labs: Invalid input(s): "PROCALCITONIN", "LACTICIDVEN"  Microbiology: No results found for this or any previous visit (from the past 240 hours).  Radiology Studies: No results found.  Scheduled Meds:  apixaban  5 mg Oral BID   atorvastatin  80 mg Oral QODAY   LORazepam  1 mg Oral BID   mometasone-formoterol  2 puff Inhalation BID   multivitamin with minerals  1 tablet Oral Q breakfast   pantoprazole  40 mg Oral Daily   sodium chloride flush  3 mL Intravenous Q12H   venlafaxine XR  75 mg Oral Q breakfast   Continuous Infusions:     LOS: 13 days   35 minutes with more than 50% spent in reviewing records, counseling patient/family and coordinating care.  Tobey Grim, MD [facility] [email or web address] 09/14/2023, 11:01 AM

## 2023-09-14 NOTE — Progress Notes (Addendum)
 Patient ID: Jordan [name], male   DOB: 16-Dec-1950, 73 y.o.   MRN: [id number] S: Feels well, no complaints.  UOP has been ok but urine looks very dark this morning. O:BP 110/61 (BP Location: Right Wrist)   Pulse 92   Temp 98.3 F (36.8 C) (Oral)   Resp 16   Ht 6\' 1"  (1.854 m)   Wt 102 kg   SpO2 92%   BMI 29.67 kg/m   Intake/Output Summary (Last 24 hours) at 09/14/2023 1129 Last data filed at 09/14/2023 4098 Gross per 24 hour  Intake 720 ml  Output 1025 ml  Net -305 ml   Intake/Output: I/O last 3 completed shifts: In: 1332.7 [P.O.:1260; I.V.:72.7] Out: 825 [Urine:825]  Intake/Output this shift:  Total I/O In: -  Out: 600 [Urine:600] Weight change:  Gen: NAD CVS: RRR Resp: CTA Abd: +BS, soft, NT/ND Ext: no edema, no changes to ulcerations.  Recent Labs  Lab 09/08/23 0803 09/09/23 1022 09/10/23 0457 09/11/23 0419 09/12/23 0719 09/13/23 0518 09/14/23 0450  NA 135 135 137 139 137 137 135  K 4.4 3.9 3.8 3.7 3.4* 3.8 3.7  CL 104 104 101 99 96* 98 98  CO2 21* 23 26 29 31 28 29   GLUCOSE 88 155* 91 88 94 94 100*  BUN 59* 60* 57* 62* 64* 61* 62*  CREATININE 2.46* 2.22* 2.22* 2.28* 2.22* 2.08* 2.19*  ALBUMIN  --  2.0* 2.1* 2.8* 2.5* 2.6* 2.3*  CALCIUM 7.8* 7.9* 8.0* 8.3* 8.1* 8.3* 8.3*  PHOS  --  4.6 4.7* 4.9* 4.7* 4.2 3.9   Liver Function Tests: Recent Labs  Lab 09/12/23 0719 09/13/23 0518 09/14/23 0450  ALBUMIN 2.5* 2.6* 2.3*   No results for input(s): "LIPASE", "AMYLASE" in the last 168 hours. No results for input(s): "AMMONIA" in the last 168 hours. CBC: Recent Labs  Lab 09/10/23 0503 09/11/23 0419 09/11/23 1431 09/12/23 0722 09/13/23 0518 09/14/23 0450  WBC 8.6 8.0  --  9.2 9.9 9.0  HGB 7.5* 6.7*   < > 7.7* 7.5* 7.6*  HCT 27.1* 23.0*   < > 26.9* 26.3* 26.3*  MCV 78.3* 75.9*  --  78.2* 79.7* 79.2*  PLT 294 353  --  342 353 349   < > = values in this interval not displayed.   Cardiac Enzymes: No results for input(s): "CKTOTAL", "CKMB",  "CKMBINDEX", "TROPONINI" in the last 168 hours. CBG: Recent Labs  Lab 09/12/23 2026  GLUCAP 105*    Iron Studies: No results for input(s): "IRON", "TIBC", "TRANSFERRIN", "FERRITIN" in the last 72 hours. Studies/Results: PERIPHERAL VASCULAR CATHETERIZATION Result Date: 09/12/2023 Table formatting from the original result was not included. Images from the original result were not included.   Patient name: Jordan [name]     MRN: [id number]        DOB: May 08, 1951          Sex: male  09/12/2023 Pre-operative Diagnosis: Left leg ulcer Post-operative diagnosis:  Same Surgeon:  Durene Cal Procedure Performed:            1.  Ultrasound-guided access, right femoral artery            2.  Abdominal gram with CO2            3.  Selective images with catheter in left popliteal artery            4.  Left leg angiogram            5.  Conscious sedation, 31  minutes            6.  Closure device, Celt              Indications: This is a 73 year old gentleman with nonhealing wounds on both legs left worse than the right.  He had ABIs that suggested monophasic waveforms.  He is here for further evaluation.  He does have renal insufficiency and so contrast will be minimized.  Procedure:  The patient was identified in the holding area and taken to room 8.  The patient was then placed supine on the table and prepped and draped in the usual sterile fashion.  A time out was called.  Conscious sedation was administered with the use of IV fentanyl and Versed under continuous physician and nurse monitoring.  Heart rate, blood pressure, and oxygen saturation were continuously monitored.  Total sedation time was 31 minutes.  Ultrasound was used to evaluate the right common femoral artery.  It was patent .  A digital ultrasound image was acquired.  A micropuncture needle was used to access the right common femoral artery under ultrasound guidance.  An 018 wire was advanced without resistance and a micropuncture sheath was placed.   The 018 wire was removed and a benson wire was placed.  The micropuncture sheath was exchanged for a 5 french sheath.  An omniflush catheter was advanced over the wire to the level of L-1.  An abdominal angiogram with CO2 was obtained.  Next, using the omniflush catheter and a benson wire, the aortic bifurcation was crossed and the catheter was placed into theleft external iliac artery and left runoff was obtained.  Additional images were performed with a catheter in the left popliteal artery from the right groin  Findings:            Aortogram: No evidence of renal artery stenosis.  Due to bowel gas, the abdominal aorta was not well-evaluated.  There did not appear to be any significant stenosis.  Bilateral common and external iliac arteries were widely patent.            Right Lower Extremity: Not evaluated             Left Lower Extremity: The left common femoral, profundofemoral, and superficial femoral artery are widely patent.  The popliteal artery is widely patent.  The patient has three-vessel runoff.  The dominant vessel across the ankle is the anterior tibial.  There is mild stenosis in the mid posterior tibial artery, however this does not have significant perfusion of the forefoot.  Intervention: No intervention was performed as the patient should have adequate blood flow to heal his wounds.  Impression:             #1  No significant aortoiliac occlusive disease             #2  No significant left femoral-popliteal stenosis             #3  Anterior tibial is a dominant vessel across the ankle perfusing the forefoot.  The posterior tibial does have approximately a 50% lesion in its midportion however this does not contribute much to the blood flow out onto the foot.  The patient should have adequate blood flow for wound healing             #4  Recommend outpatient wound follow-up with Dr. Kandis Mannan. Durene Cal, M.D., [facility] Office: [phone] Pager:   [phone]  apixaban  5 mg Oral BID   atorvastatin  80 mg Oral QODAY   LORazepam  1 mg Oral BID   mometasone-formoterol  2 puff Inhalation BID   multivitamin with minerals  1 tablet Oral Q breakfast   pantoprazole  40 mg Oral Daily   sodium chloride flush  3 mL Intravenous Q12H   venlafaxine XR  75 mg Oral Q breakfast    BMET    Component Value Date/Time   NA 135 09/14/2023 0450   NA 139 07/02/2021 1322   K 3.7 09/14/2023 0450   CL 98 09/14/2023 0450   CO2 29 09/14/2023 0450   GLUCOSE 100 (H) 09/14/2023 0450   BUN 62 (H) 09/14/2023 0450   BUN 26 07/02/2021 1322   CREATININE 2.19 (H) 09/14/2023 0450   CALCIUM 8.3 (L) 09/14/2023 0450   GFRNONAA 31 (L) 09/14/2023 0450   GFRAA 65 07/23/2020 1533   CBC    Component Value Date/Time   WBC 9.0 09/14/2023 0450   RBC 3.32 (L) 09/14/2023 0450   HGB 7.6 (L) 09/14/2023 0450   HGB 11.0 (L) 07/02/2021 1322   HCT 26.3 (L) 09/14/2023 0450   HCT 35.0 (L) 07/02/2021 1322   PLT 349 09/14/2023 0450   PLT 266 07/02/2021 1322   MCV 79.2 (L) 09/14/2023 0450   MCV 81 07/02/2021 1322   MCH 22.9 (L) 09/14/2023 0450   MCHC 28.9 (L) 09/14/2023 0450   RDW 23.9 (H) 09/14/2023 0450   RDW 15.3 07/02/2021 1322   LYMPHSABS 1.2 09/04/2023 0407   MONOABS 0.8 09/04/2023 0407   EOSABS 0.4 09/04/2023 0407   BASOSABS 0.0 09/04/2023 0407    Assessment/ Plan: AKI: b/l creat is 1.3, from PCP's office in Jan 2024. Assuming this is AKI but could also have CKD. UA shows some rbc's/ wbc's, no proteinuria. Renal US w/o obstruction. BP's were soft in 90s on presentation and then improved after IV abx and IVF's, but pt developed pulm edema on CXR #2 w/ bilat LE edema. Suspect CKD and/or ATN / vol overload as cause of renal failure. Started IV lasix 3/19, titrating up the dose and the UOP is improving. 4.4 L UOP snd creat stable in low 2's. Cont diuresis. He dropped his Bp on the afternoon of 3/23 so Lasix was decreased to 80 mg IV tid.  Zaroxolyn on hold.   Bp was low again and IV lasix was stopped 09/11/23.  UOP has dropped but Scr stable.  Will restart po lasix and hopefully if his Scr remain stable he can be discharged back to assisted living tomorrow.   He did have a renal CO2 angiogram which was negative for renal artery stenosis.  Urine looks dark, will check UA and urine Na but should be ok to resume outpatient lasix and follow.  Soft tissue infection: SP IV abx, is now on po keflex Left leg ulcer - normal left leg angiogram Anemia - hgb down to 6.7.  to receive blood transfusion today.  COPD Atrial fib S/p TAVR Chronic resp failure - on home O2 2 L HTN - bp's low, doesn't need atenolol now, will dc.  Irena Cords, MD [employer] [phone]

## 2023-09-15 ENCOUNTER — Inpatient Hospital Stay (HOSPITAL_COMMUNITY)

## 2023-09-15 DIAGNOSIS — L089 Local infection of the skin and subcutaneous tissue, unspecified: Secondary | ICD-10-CM | POA: Diagnosis not present

## 2023-09-15 LAB — CBC
HCT: 26.7 % — ABNORMAL LOW (ref 39.0–52.0)
Hemoglobin: 7.7 g/dL — ABNORMAL LOW (ref 13.0–17.0)
MCH: 23.1 pg — ABNORMAL LOW (ref 26.0–34.0)
MCHC: 28.8 g/dL — ABNORMAL LOW (ref 30.0–36.0)
MCV: 80.2 fL (ref 80.0–100.0)
Platelets: 372 10*3/uL (ref 150–400)
RBC: 3.33 MIL/uL — ABNORMAL LOW (ref 4.22–5.81)
RDW: 24.9 % — ABNORMAL HIGH (ref 11.5–15.5)
WBC: 10.1 10*3/uL (ref 4.0–10.5)
nRBC: 0 % (ref 0.0–0.2)

## 2023-09-15 LAB — RENAL FUNCTION PANEL
Albumin: 2.3 g/dL — ABNORMAL LOW (ref 3.5–5.0)
Anion gap: 12 (ref 5–15)
BUN: 66 mg/dL — ABNORMAL HIGH (ref 8–23)
CO2: 27 mmol/L (ref 22–32)
Calcium: 8.3 mg/dL — ABNORMAL LOW (ref 8.9–10.3)
Chloride: 96 mmol/L — ABNORMAL LOW (ref 98–111)
Creatinine, Ser: 2.66 mg/dL — ABNORMAL HIGH (ref 0.61–1.24)
GFR, Estimated: 25 mL/min — ABNORMAL LOW (ref >60)
Glucose, Bld: 88 mg/dL (ref 70–99)
Phosphorus: 4.8 mg/dL — ABNORMAL HIGH (ref 2.5–4.6)
Potassium: 3.7 mmol/L (ref 3.5–5.1)
Sodium: 135 mmol/L (ref 135–145)

## 2023-09-15 MED ORDER — SODIUM CHLORIDE 0.9 % IV SOLN: INTRAVENOUS | Status: AC

## 2023-09-15 MED ORDER — SODIUM CHLORIDE 0.9 % IV BOLUS
500.0000 mL | Freq: Once | INTRAVENOUS | Status: AC
  Administered 2023-09-15: 500 mL via INTRAVENOUS

## 2023-09-15 NOTE — Progress Notes (Signed)
 PROGRESS NOTE  Jordan [name]  ZOX:[id number] DOB: 16-Apr-1951 DOA: 09/01/2023 PCP: Kennith Center, NP  Consultants  Brief Narrative: 73 year old with past medical history significant for hypertension, Heart failure preserved ejection fraction, A-fib, CAD, COPD with chronic hypoxic respiratory failure on 2 L of oxygen, chronic venous stasis, PVD admitted 3/17 for worsening cellulitis failing outpatient antibiotics, after injury at nursing home previous week.    Hospital course complicated by AKI on CKD, pulmonary edema, hypotension required IV albumin and discontinuation of atenolol.  Due to concern for peripheral vascular disease and lower extremity wounds he underwent arteriogram by Dr. Myra Gianotti on 3/25, which showed no significant aortoiliac occlusive disease or femoropopliteal disease, anterior tibial dominant vessel perfusing to the ankle and forefoot.  Should  have adequate blood flow for wound healing.     Assessment & Plan:  AKI, on ? CKD -Main thing keeping him in the hospital.  -Creatinine unfortunately rose to 2.66 today. -Still with low output and dark urine in his Foley. - Wonder if we have over-diuresed him, or he is just intravascularly dry based on UOP and urine in foley.  Regardless 500 cc bolus IV today.  Of note patient has no water on his tray just 1 cup of soda.  Need to continue to hydrate with water orally  Soft tissue infection, LE infection, ulcers.  Chronic venous stasis disease PVD -Continue local care.  -Completed 10 days Keflex, He will complete 5 days of Doxy on 3/25.  Abx now finished. -Vascular consulted. Underwent arteriogram 3/25, no insignificant PVD, has adequate blood flow for wound healing.  -Redness less intense. Needs to follow up with Dr Lajoyce Corners out patient.  -Lower extremity edema better today.  Hypokalemia:  Resolved.    Hypertension://Hypotension.  Holding  atenolol to avoid hypotension Had soft BP, lasix held.  Received IV albumin. Vitals  good currently.   A-fib: Restarted eliquis 3/26 after on heparin for procedure 3/25. Atenolol discontinue by nephrology due to hypotension.  If HR continue to be elevated, might need to start Amiodarone or could try low dose metoprolol if BP allows.  -Heart rate better today in the 90s.   COPD Chronic hypoxic respiratory failure on 2 L of oxygen No further wheezing on exam.   Anxiety depression: Continue Effexor and Ativan   Anemia; iron deficiency:  Received  IV iron 3/23. Hb down to 6. Received  one unit PRBC> 3/24. No evidence of active bleeding.  Following hemoglobin       Pressure Injury 09/02/23 Heel Left Unstageable - Full thickness tissue loss in which the base of the injury is covered by slough (yellow, tan, gray, green or brown) and/or eschar (tan, brown or black) in the wound bed. (Active)  09/02/23 1713  Location: Heel  Location Orientation: Left  Staging: Unstageable - Full thickness tissue loss in which the base of the injury is covered by slough (yellow, tan, gray, green or brown) and/or eschar (tan, brown or black) in the wound bed.  Wound Description (Comments):   Present on Admission: Yes  Dressing Type Foam - Lift dressing to assess site every shift 09/15/23 0801   DVT prophylaxis:  apixaban (ELIQUIS) tablet 5 mg  Code Status:   Code Status: Full Code Level of care: Med-Surg Status is: Inpatient Dispo: To SNF before ALF when medically ready.  Will likely be with [place] through the weekend due to bump in creatinine today.   Consults called: Nephrology   Subjective: Patient awake and alert today.  Without  any complaints.  Eating and drinking well.  Sitting up in bed.  Objective: Vitals:   09/15/23 0208 09/15/23 0450 09/15/23 0500 09/15/23 1353  BP: 116/73 121/80  124/81  Pulse: (!) 102   92  Resp: 15 15  18   Temp: 98.3 F (36.8 C) 98 F (36.7 C)  97.9 F (36.6 C)  TempSrc: Oral Oral  Oral  SpO2: 97% 97%  98%  Weight:   99.2 kg   Height:         Intake/Output Summary (Last 24 hours) at 09/15/2023 1443 Last data filed at 09/15/2023 1400 Gross per 24 hour  Intake 1310 ml  Output 1150 ml  Net 160 ml   Filed Weights   09/12/23 0500 09/13/23 0401 09/15/23 0500  Weight: 99.6 kg 102 kg 99.2 kg   Body mass index is 28.85 kg/m.  Gen: 73 y.o. male in no apparent distress.  Nontoxic Pulm: Non-labored breathing.  Clear to auscultation bilaterally.  CV: Regular rate and rhythm. No murmur, rub, or gallop. No JVD GI: Abdomen soft, non-tender, non-distended, with normoactive bowel sounds Extremities: BL LE ulceration, toe with ulceration LE with less intense  redness. Hyperpigmentation BL changes consistent with chronic venous insufficiency.  Slowly healing. GU: Condom cath in place.  Notably dark urine in minimal amount in Foley bag Neuro: Alert and oriented. No focal neurological deficits. Psych: Calm  Judgement and insight appear normal. Mood & affect appropriate.     I have personally reviewed the following labs and images: CBC: Recent Labs  Lab 09/11/23 0419 09/11/23 1431 09/12/23 0722 09/13/23 0518 09/14/23 0450 09/15/23 0413  WBC 8.0  --  9.2 9.9 9.0 10.1  HGB 6.7* 7.7* 7.7* 7.5* 7.6* 7.7*  HCT 23.0* 25.9* 26.9* 26.3* 26.3* 26.7*  MCV 75.9*  --  78.2* 79.7* 79.2* 80.2  PLT 353  --  342 353 349 372   BMP &GFR Recent Labs  Lab 09/11/23 0419 09/12/23 0719 09/13/23 0518 09/14/23 0450 09/15/23 0415  NA 139 137 137 135 135  K 3.7 3.4* 3.8 3.7 3.7  CL 99 96* 98 98 96*  CO2 29 31 28 29 27   GLUCOSE 88 94 94 100* 88  BUN 62* 64* 61* 62* 66*  CREATININE 2.28* 2.22* 2.08* 2.19* 2.66*  CALCIUM 8.3* 8.1* 8.3* 8.3* 8.3*  PHOS 4.9* 4.7* 4.2 3.9 4.8*   Estimated Creatinine Clearance: 30.6 mL/min (A) (by C-G formula based on SCr of 2.66 mg/dL (H)). Liver & Pancreas: Recent Labs  Lab 09/11/23 0419 09/12/23 0719 09/13/23 0518 09/14/23 0450 09/15/23 0415  ALBUMIN 2.8* 2.5* 2.6* 2.3* 2.3*   No results for input(s):  "LIPASE", "AMYLASE" in the last 168 hours. No results for input(s): "AMMONIA" in the last 168 hours. Diabetic: No results for input(s): "HGBA1C" in the last 72 hours. Recent Labs  Lab 09/12/23 2026  GLUCAP 105*   Cardiac Enzymes: No results for input(s): "CKTOTAL", "CKMB", "CKMBINDEX", "TROPONINI" in the last 168 hours. No results for input(s): "PROBNP" in the last 8760 hours. Coagulation Profile: No results for input(s): "INR", "PROTIME" in the last 168 hours. Thyroid Function Tests: No results for input(s): "TSH", "T4TOTAL", "FREET4", "T3FREE", "THYROIDAB" in the last 72 hours. Lipid Profile: Recent Labs    09/13/23 0518  CHOL 97  HDL 26*  LDLCALC 56  TRIG 77  CHOLHDL 3.7   Anemia Panel: No results for input(s): "VITAMINB12", "FOLATE", "FERRITIN", "TIBC", "IRON", "RETICCTPCT" in the last 72 hours. Urine analysis:    Component Value Date/Time   COLORURINE  AMBER (A) 09/14/2023 1349   APPEARANCEUR CLOUDY (A) 09/14/2023 1349   LABSPEC 1.013 09/14/2023 1349   PHURINE 6.0 09/14/2023 1349   GLUCOSEU NEGATIVE 09/14/2023 1349   HGBUR MODERATE (A) 09/14/2023 1349   BILIRUBINUR NEGATIVE 09/14/2023 1349   KETONESUR NEGATIVE 09/14/2023 1349   PROTEINUR 100 (A) 09/14/2023 1349   NITRITE NEGATIVE 09/14/2023 1349   LEUKOCYTESUR TRACE (A) 09/14/2023 1349   Sepsis Labs: Invalid input(s): "PROCALCITONIN", "LACTICIDVEN"  Microbiology: No results found for this or any previous visit (from the past 240 hours).  Radiology Studies: No results found.  Scheduled Meds:  apixaban  5 mg Oral BID   atorvastatin  80 mg Oral QODAY   LORazepam  1 mg Oral BID   mometasone-formoterol  2 puff Inhalation BID   multivitamin with minerals  1 tablet Oral Q breakfast   pantoprazole  40 mg Oral Daily   sodium chloride flush  3 mL Intravenous Q12H   venlafaxine XR  75 mg Oral Q breakfast   Continuous Infusions:  sodium chloride 75 mL/hr at 09/15/23 0833      LOS: 14 days   35 minutes with  more than 50% spent in reviewing records, counseling patient/family and coordinating care.  Tobey Grim, MD [facility] [email or web address] 09/15/2023, 2:43 PM

## 2023-09-15 NOTE — TOC Progression Note (Signed)
 Transition of Care [facility]) - Progression Note    Patient Details  Name: Jordan [name] MRN: [id number] Date of Birth: 30-Jan-1951  Transition of [facility]) CM/SW Contact  Amada [place], [place] Phone Number: 09/15/2023, 1:16 PM  Clinical Narrative:     Contacted by [facility] RN that pt's progress notes have been reviewed and they do not feel pt is at a care level that they can provide and continue to recommend SNF rehab prior to his return.  [facility] RN has informed patient.  CSW followed up with pt and he is now agreed to continue with plan for SNF.  Have confirmed bed still available to pt at [facility]  Have submitted and received insurance authorization Berkley Harvey ID# [id number] good 3/28 - 4/1).  Per MD, pt not medically ready for dc today.  TOC will continue to follow and assist with transfer when cleared.  Expected Discharge Plan: Assisted Living Barriers to Discharge: Continued Medical Work up  Expected Discharge Plan and Services In-house Referral: Clinical Social Work     Living arrangements for the past 2 months: Assisted Living Facility                                       Social Determinants of Health (SDOH) Interventions SDOH Screenings   Food Insecurity: Patient Declined (09/01/2023)  Housing: Patient Declined (09/01/2023)  Transportation Needs: Patient Declined (09/01/2023)  Utilities: Patient Declined (09/01/2023)  Social Connections: Patient Declined (09/01/2023)  Tobacco Use: High Risk (09/01/2023)    Readmission Risk Interventions    09/04/2023    2:49 PM 04/04/2022   11:02 AM  Readmission Risk Prevention Plan  Transportation Screening Complete Complete  PCP or Specialist Appt within 5-7 Days Complete   PCP or Specialist Appt within 3-5 Days  Complete  Home Care Screening Complete   Medication Review (RN CM) Complete   HRI or Home Care Consult  Complete  Social Work Consult for Recovery Care Planning/Counseling  Complete  Palliative Care  Screening  Not Applicable  Medication Review Ocean[employer])  Complete

## 2023-09-15 NOTE — Plan of Care (Signed)

## 2023-09-15 NOTE — Progress Notes (Signed)
 Physical Therapy Treatment Patient Details Name: BEREL NAJJAR MRN: [id number] DOB: July 07, 1950 Today's Date: 09/15/2023   History of Present Illness 73 yo male admitted with soft tissue infection/L LE wound, L toe laceration after sustaining a fall at ALF while be transferred back to bed per pt report. 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 Pt needing max A to come to sitting EOB, heavy cues and education regarding HOB elevation, bedrail use and ultimately needing heavy assist to scoot forward with bedpad once seated EOB. Pt needing BUE support to steady self at EOB; continues RUE weeping and elevated R shoulder noted. Bed to recliner transfer with stedy, max A+2, significant whole body weakness with BUE limited in pulling on STEDY crossbar and BLE prefer flexed posture. Initial STS on stedy, pt unable to upright enough to swing stedy paddles down; on 2nd attempt successful. Pt transferred into recliner, nursing in room to assist pt further with R UE weeping, bedding, ill fitting condom catheter and elevated R shoulder. Pt tolerates mobilizing OOB to recliner this date, though does fatigue easily and has generalized pain with movement. Patient will benefit from continued inpatient follow up therapy, <3 hours/day.   If plan is discharge home, recommend the following: Assistance with cooking/housework;Assist for transportation;Help with stairs or ramp for entrance;Two people to help with walking and/or transfers;Two people to help with bathing/dressing/bathroom   Can travel by private vehicle     No  Equipment Recommendations  None recommended by PT    Recommendations for Other Services       Precautions /  Restrictions Precautions Precautions: Fall Precaution/Restrictions Comments: multiple LE wounds-tender to touch; chronic 2L baseline Restrictions Weight Bearing Restrictions Per Provider Order: No     Mobility  Bed Mobility Overal bed mobility: Needs Assistance Bed Mobility: Supine to Sit     Supine to sit: Max assist, HOB elevated, Used rails     General bed mobility comments: max A to come to sitting EOB, pt able to inch BLE towards EOB then states "help me here" while reaching for therapist, heavy education regarding safety wtih mobility, cued pt on bedrail use to upright trunk, ultimately needing max A to come to sitting EOB wtih increased time, heavy use of bedpad to scoot pt forward    Transfers Overall transfer level: Needs assistance   Transfers: Bed to chair/wheelchair/BSC             General transfer comment: initial STS with max A+2 to power up to stand, pt maintains flexed BLE and trunk posture despite multimodal cues for upright posture, limited standing tolerance so returned to sitting; 2nd attempt +2 assisting with bedpad under hips to cue pt to shift forward to swing STEDY paddles down; nursing in room placed lift pad in pt's recliner and verbalizes need for assisting pt back to bed Transfer via Lift Equipment: Stedy  Ambulation/Gait                   Stairs             Wheelchair Mobility     Tilt Bed    Modified Rankin (Stroke Patients Only)       Balance Overall balance  assessment: Needs assistance Sitting-balance support: Bilateral upper extremity supported, Feet supported Sitting balance-Leahy Scale: Poor Sitting balance - Comments: BUE support at all times, without UE support neednig min A to maintain sitting EOB                                    Communication    Cognition Arousal: Alert Behavior During Therapy: WFL for tasks assessed/performed   PT - Cognitive impairments: No apparent impairments                        PT - Cognition Comments: needing repeat cues at times, unsure if [facility] or self directed Following commands: Intact      Cueing Cueing Techniques: Verbal cues  Exercises      General Comments General comments (skin integrity, edema, etc.): BLE wounds noted with dressings in tact, RUE weeping noted and nursing in room to wrap at EOS      Pertinent Vitals/Pain Pain Assessment Pain Assessment: 0-10 Pain Location: generalized with movement Pain Descriptors / Indicators: Grimacing, Guarding, Discomfort Pain Intervention(s): Limited activity within patient's tolerance, Monitored during session, Repositioned    Home Living                          Prior Function            PT Goals (current goals can now be found in the care plan section) Acute Rehab PT Goals Patient Stated Goal: less pain. return to ALF Progress towards PT goals: Progressing toward goals (very slowly, limited)    Frequency    Min 2X/week      PT Plan      Co-evaluation              AM-PAC PT "6 Clicks" Mobility   Outcome Measure  Help needed turning from your back to your side while in a flat bed without using bedrails?: A Lot Help needed moving from lying on your back to sitting on the side of a flat bed without using bedrails?: A Lot Help needed moving to and from a bed to a chair (including a wheelchair)?: Total Help needed standing up from a chair using your arms (e.g., wheelchair or bedside chair)?: Total Help needed to walk in hospital room?: Total Help needed climbing 3-5 steps with a railing? : Total 6 Click Score: 8    End of Session Equipment Utilized During Treatment: Gait belt;Oxygen Activity Tolerance: Patient limited by fatigue Patient left: in chair;with call bell/phone within reach;with nursing/sitter in room Nurse Communication: Mobility status;Need for lift equipment PT Visit Diagnosis: Muscle weakness (generalized) (M62.81);History of  falling (Z91.81) Pain - part of body: Leg     Time: 1610-9604 PT Time Calculation (min) (ACUTE ONLY): 36 min  Charges:    $Therapeutic Activity: 23-37 mins PT General Charges $$ ACUTE PT VISIT: 1 Visit                     Tori Hasan Douse PT, DPT 09/15/23, 12:53 PM

## 2023-09-15 NOTE — Plan of Care (Signed)
   Problem: Education: Goal: Knowledge of General Education information will improve Description Including pain rating scale, medication(s)/side effects and non-pharmacologic comfort measures Outcome: Progressing   Problem: Health Behavior/Discharge Planning: Goal: Ability to manage health-related needs will improve Outcome: Progressing

## 2023-09-15 NOTE — Progress Notes (Signed)
 Patient ID: TAITEN BRAWN, male   DOB: March 25, 1951, 73 y.o.   MRN: [id number] S: no new complaints.   O:BP 121/80 (BP Location: Left Arm)   Pulse (!) 102   Temp 98 F (36.7 C) (Oral)   Resp 15   Ht 6\' 1"  (1.854 m)   Wt 99.2 kg   SpO2 97%   BMI 28.85 kg/m   Intake/Output Summary (Last 24 hours) at 09/15/2023 1139 Last data filed at 09/15/2023 1000 Gross per 24 hour  Intake 950 ml  Output 1200 ml  Net -250 ml   Intake/Output: I/O last 3 completed shifts: In: 710 [P.O.:710] Out: 1700 [Urine:1700]  Intake/Output this shift:  Total I/O In: 360 [P.O.:360] Out: 100 [Urine:100] Weight change:  Gen: NAD CVS: Tachy Resp: CTA Abd: +BS, soft,NT/ND Ext: no edema, ulcerations of lower extremities unchanged.   Recent Labs  Lab 09/09/23 1022 09/10/23 0457 09/11/23 0419 09/12/23 0719 09/13/23 0518 09/14/23 0450 09/15/23 0415  NA 135 137 139 137 137 135 135  K 3.9 3.8 3.7 3.4* 3.8 3.7 3.7  CL 104 101 99 96* 98 98 96*  CO2 23 26 29 31 28 29 27   GLUCOSE 155* 91 88 94 94 100* 88  BUN 60* 57* 62* 64* 61* 62* 66*  CREATININE 2.22* 2.22* 2.28* 2.22* 2.08* 2.19* 2.66*  ALBUMIN 2.0* 2.1* 2.8* 2.5* 2.6* 2.3* 2.3*  CALCIUM 7.9* 8.0* 8.3* 8.1* 8.3* 8.3* 8.3*  PHOS 4.6 4.7* 4.9* 4.7* 4.2 3.9 4.8*   Liver Function Tests: Recent Labs  Lab 09/13/23 0518 09/14/23 0450 09/15/23 0415  ALBUMIN 2.6* 2.3* 2.3*   No results for input(s): "LIPASE", "AMYLASE" in the last 168 hours. No results for input(s): "AMMONIA" in the last 168 hours. CBC: Recent Labs  Lab 09/11/23 0419 09/11/23 1431 09/12/23 0722 09/13/23 0518 09/14/23 0450 09/15/23 0413  WBC 8.0  --  9.2 9.9 9.0 10.1  HGB 6.7*   < > 7.7* 7.5* 7.6* 7.7*  HCT 23.0*   < > 26.9* 26.3* 26.3* 26.7*  MCV 75.9*  --  78.2* 79.7* 79.2* 80.2  PLT 353  --  342 353 349 372   < > = values in this interval not displayed.   Cardiac Enzymes: No results for input(s): "CKTOTAL", "CKMB", "CKMBINDEX", "TROPONINI" in the last 168  hours. CBG: Recent Labs  Lab 09/12/23 2026  GLUCAP 105*    Iron Studies: No results for input(s): "IRON", "TIBC", "TRANSFERRIN", "FERRITIN" in the last 72 hours. Studies/Results: No results found.  apixaban  5 mg Oral BID   atorvastatin  80 mg Oral QODAY   LORazepam  1 mg Oral BID   mometasone-formoterol  2 puff Inhalation BID   multivitamin with minerals  1 tablet Oral Q breakfast   pantoprazole  40 mg Oral Daily   sodium chloride flush  3 mL Intravenous Q12H   venlafaxine XR  75 mg Oral Q breakfast    BMET    Component Value Date/Time   NA 135 09/15/2023 0415   NA 139 07/02/2021 1322   K 3.7 09/15/2023 0415   CL 96 (L) 09/15/2023 0415   CO2 27 09/15/2023 0415   GLUCOSE 88 09/15/2023 0415   BUN 66 (H) 09/15/2023 0415   BUN 26 07/02/2021 1322   CREATININE 2.66 (H) 09/15/2023 0415   CALCIUM 8.3 (L) 09/15/2023 0415   GFRNONAA 25 (L) 09/15/2023 0415   GFRAA 65 07/23/2020 1533   CBC    Component Value Date/Time   WBC 10.1 09/15/2023 0413  RBC 3.33 (L) 09/15/2023 0413   HGB 7.7 (L) 09/15/2023 0413   HGB 11.0 (L) 07/02/2021 1322   HCT 26.7 (L) 09/15/2023 0413   HCT 35.0 (L) 07/02/2021 1322   PLT 372 09/15/2023 0413   PLT 266 07/02/2021 1322   MCV 80.2 09/15/2023 0413   MCV 81 07/02/2021 1322   MCH 23.1 (L) 09/15/2023 0413   MCHC 28.8 (L) 09/15/2023 0413   RDW 24.9 (H) 09/15/2023 0413   RDW 15.3 07/02/2021 1322   LYMPHSABS 1.2 09/04/2023 0407   MONOABS 0.8 09/04/2023 0407   EOSABS 0.4 09/04/2023 0407   BASOSABS 0.0 09/04/2023 0407    Assessment/ Plan: AKI: b/l creat is 1.3, from PCP's office in Jan 2024. Assuming this is AKI but could also have CKD. UA shows some rbc's/ wbc's, no proteinuria. Renal US w/o obstruction. BP's were soft in 90s on presentation and then improved after IV abx and IVF's, but pt developed pulm edema on CXR #2 w/ bilat LE edema. Suspect CKD and/or ATN / vol overload as cause of renal failure. Started IV lasix 3/19, titrating up the dose  and the UOP is improving. 4.4 L UOP snd creat stable in low 2's. Cont diuresis. He dropped his Bp on the afternoon of 3/23 so Lasix was decreased to 80 mg IV tid.  Zaroxolyn on hold.  Bp was low again and IV lasix was stopped 09/11/23.  UOP has dropped but Scr stable.  We restarted po lasix yesteday with increased UOP but unfortunately the Scr rose to 2.66.  Po lasix stopped and started on IVF's NS at 75 mL/hr.  Hopefully if his Scr will improve and may have been over diuresed (although lasix was held for 2 days and Urine Na was elevated).  did have a renal CO2 angiogram which was negative for renal artery stenosis.  .  Soft tissue infection: SP IV abx, is now on po keflex Left leg ulcer - normal left leg angiogram Anemia - hgb down to 6.7.  to receive blood transfusion today.  COPD Atrial fib S/p TAVR Chronic resp failure - on home O2 2 L HTN - bp's low, doesn't need atenolol now, will dc. Disposition - will need SNF prior to going back to assisted living at [facility].    Irena Cords, MD [facility]

## 2023-09-16 DIAGNOSIS — L089 Local infection of the skin and subcutaneous tissue, unspecified: Secondary | ICD-10-CM | POA: Diagnosis not present

## 2023-09-16 LAB — RENAL FUNCTION PANEL
Albumin: 2.1 g/dL — ABNORMAL LOW (ref 3.5–5.0)
Anion gap: 10 (ref 5–15)
BUN: 69 mg/dL — ABNORMAL HIGH (ref 8–23)
CO2: 24 mmol/L (ref 22–32)
Calcium: 7.7 mg/dL — ABNORMAL LOW (ref 8.9–10.3)
Chloride: 100 mmol/L (ref 98–111)
Creatinine, Ser: 3.35 mg/dL — ABNORMAL HIGH (ref 0.61–1.24)
GFR, Estimated: 19 mL/min — ABNORMAL LOW (ref >60)
Glucose, Bld: 108 mg/dL — ABNORMAL HIGH (ref 70–99)
Phosphorus: 5 mg/dL — ABNORMAL HIGH (ref 2.5–4.6)
Potassium: 3.5 mmol/L (ref 3.5–5.1)
Sodium: 134 mmol/L — ABNORMAL LOW (ref 135–145)

## 2023-09-16 LAB — CBC
HCT: 25.2 % — ABNORMAL LOW (ref 39.0–52.0)
Hemoglobin: 7 g/dL — ABNORMAL LOW (ref 13.0–17.0)
MCH: 22.7 pg — ABNORMAL LOW (ref 26.0–34.0)
MCHC: 27.8 g/dL — ABNORMAL LOW (ref 30.0–36.0)
MCV: 81.6 fL (ref 80.0–100.0)
Platelets: 301 10*3/uL (ref 150–400)
RBC: 3.09 MIL/uL — ABNORMAL LOW (ref 4.22–5.81)
RDW: 25.2 % — ABNORMAL HIGH (ref 11.5–15.5)
WBC: 7.8 10*3/uL (ref 4.0–10.5)
nRBC: 0 % (ref 0.0–0.2)

## 2023-09-16 MED ORDER — SODIUM CHLORIDE 0.9 % IV SOLN: INTRAVENOUS | Status: DC

## 2023-09-16 NOTE — Progress Notes (Signed)
 PROGRESS NOTE  AZAR SOUTH  WUJ:[id number] DOB: 03-16-1951 DOA: 09/01/2023 PCP: Kennith Center, NP  Consultants  Brief Narrative: 73 year old with past medical history significant for hypertension, Heart failure preserved ejection fraction, A-fib, CAD, COPD with chronic hypoxic respiratory failure on 2 L of oxygen, chronic venous stasis, PVD admitted 3/17 for worsening cellulitis failing outpatient antibiotics, after injury at nursing home previous week.    Hospital course complicated by AKI on CKD, pulmonary edema, hypotension required IV albumin and discontinuation of atenolol.  Due to concern for peripheral vascular disease and lower extremity wounds he underwent arteriogram by Dr. Myra Gianotti on 3/25, which showed no significant aortoiliac occlusive disease or femoropopliteal disease, anterior tibial dominant vessel perfusing to the ankle and forefoot.  Should have adequate blood flow for wound healing.  Elevated creatinine is main thing keeping him in the hospital.       Assessment & Plan:  AKI, on ? CKD -Main thing keeping him in the hospital.  -Creatinine unfortunately rose to 2.66 yesterday 3/28.  Labs not drawn today.  Still with dark urine in Foley bag.   - IVF not going currently going.  Appreciate renal input, IVF restarted.   - likely over-diuresed, restarting IVF, continue and await creatinine   Soft tissue infection, LE infection, ulcers.  Chronic venous stasis disease PVD -Continue local care.  -Completed 10 days Keflex, He will complete 5 days of Doxy on 3/25.  Abx now finished. -Vascular consulted. Underwent arteriogram 3/25, no insignificant PVD, has adequate blood flow for wound healing.  -Redness less intense. Needs to follow up with Dr Lajoyce Corners out patient.  -Lower extremity edema better today.  Hypokalemia:  Resolved.    Hypertension://Hypotension.  Holding  atenolol to avoid hypotension Had soft BP, lasix held.  Received IV albumin. Vitals good currently.    A-fib: Restarted eliquis 3/26 after on heparin for procedure 3/25. Atenolol discontinue by nephrology due to hypotension.  If HR continue to be elevated, might need to start Amiodarone or could try low dose metoprolol if BP allows.  -Heart rate better today in the 90s.   COPD Chronic hypoxic respiratory failure on 2 L of oxygen No further wheezing on exam.   Anxiety depression: Continue Effexor and Ativan   Anemia; iron deficiency:  Received  IV iron 3/23. Hb down to 6. Received  one unit PRBC> 3/24. No evidence of active bleeding.  Following hemoglobin       Pressure Injury 09/02/23 Heel Left Unstageable - Full thickness tissue loss in which the base of the injury is covered by slough (yellow, tan, gray, green or brown) and/or eschar (tan, brown or black) in the wound bed. (Active)  09/02/23 1713  Location: Heel  Location Orientation: Left  Staging: Unstageable - Full thickness tissue loss in which the base of the injury is covered by slough (yellow, tan, gray, green or brown) and/or eschar (tan, brown or black) in the wound bed.  Wound Description (Comments):   Present on Admission: Yes  Dressing Type Foam - Lift dressing to assess site every shift 09/16/23 0825   DVT prophylaxis:  apixaban (ELIQUIS) tablet 5 mg  Code Status:   Code Status: Full Code Level of care: Med-Surg Status is: Inpatient Dispo: To SNF before ALF when medically ready.  Will be with [place] through the weekend due to bump in creatinine today.   Consults called: Nephrology   Subjective: Patient awake and alert today.  No complaints.  Eating and drinking well.  Sitting up in  bed.  Objective: Vitals:   09/15/23 2033 09/15/23 2228 09/16/23 0500 09/16/23 0531  BP:  115/72  114/73  Pulse:  75  (!) 102  Resp:  17  16  Temp:  97.9 F (36.6 C)  97.8 F (36.6 C)  TempSrc:  Oral  Oral  SpO2: 97% 90%  94%  Weight:   101.2 kg   Height:        Intake/Output Summary (Last 24 hours) at 09/16/2023  1052 Last data filed at 09/16/2023 0600 Gross per 24 hour  Intake 1690 ml  Output 850 ml  Net 840 ml   Filed Weights   09/13/23 0401 09/15/23 0500 09/16/23 0500  Weight: 102 kg 99.2 kg 101.2 kg   Body mass index is 29.44 kg/m.  Gen: 73 y.o. male in no apparent distress.  Nontoxic Pulm: Non-labored breathing.  Clear to auscultation bilaterally.  CV: Regular rate and rhythm. No murmur, rub, or gallop. No JVD GI: Abdomen soft, non-tender, non-distended, with normoactive bowel sounds Extremities: BL LE ulceration, toe with ulceration LE with less intense  redness. Hyperpigmentation BL changes consistent with chronic venous insufficiency.  Slowly healing. GU: Condom cath in place.  Notably dark urine in minimal amount in Foley bag Neuro: Alert and oriented. No focal neurological deficits. Psych: Calm  Judgement and insight appear normal. Mood & affect appropriate.     I have personally reviewed the following labs and images: CBC: Recent Labs  Lab 09/11/23 0419 09/11/23 1431 09/12/23 0722 09/13/23 0518 09/14/23 0450 09/15/23 0413  WBC 8.0  --  9.2 9.9 9.0 10.1  HGB 6.7* 7.7* 7.7* 7.5* 7.6* 7.7*  HCT 23.0* 25.9* 26.9* 26.3* 26.3* 26.7*  MCV 75.9*  --  78.2* 79.7* 79.2* 80.2  PLT 353  --  342 353 349 372   BMP &GFR Recent Labs  Lab 09/11/23 0419 09/12/23 0719 09/13/23 0518 09/14/23 0450 09/15/23 0415  NA 139 137 137 135 135  K 3.7 3.4* 3.8 3.7 3.7  CL 99 96* 98 98 96*  CO2 29 31 28 29 27   GLUCOSE 88 94 94 100* 88  BUN 62* 64* 61* 62* 66*  CREATININE 2.28* 2.22* 2.08* 2.19* 2.66*  CALCIUM 8.3* 8.1* 8.3* 8.3* 8.3*  PHOS 4.9* 4.7* 4.2 3.9 4.8*   Estimated Creatinine Clearance: 30.9 mL/min (A) (by C-G formula based on SCr of 2.66 mg/dL (H)). Liver & Pancreas: Recent Labs  Lab 09/11/23 0419 09/12/23 0719 09/13/23 0518 09/14/23 0450 09/15/23 0415  ALBUMIN 2.8* 2.5* 2.6* 2.3* 2.3*   No results for input(s): "LIPASE", "AMYLASE" in the last 168 hours. No results  for input(s): "AMMONIA" in the last 168 hours. Diabetic: No results for input(s): "HGBA1C" in the last 72 hours. Recent Labs  Lab 09/12/23 2026  GLUCAP 105*   Cardiac Enzymes: No results for input(s): "CKTOTAL", "CKMB", "CKMBINDEX", "TROPONINI" in the last 168 hours. No results for input(s): "PROBNP" in the last 8760 hours. Coagulation Profile: No results for input(s): "INR", "PROTIME" in the last 168 hours. Thyroid Function Tests: No results for input(s): "TSH", "T4TOTAL", "FREET4", "T3FREE", "THYROIDAB" in the last 72 hours. Lipid Profile: No results for input(s): "CHOL", "HDL", "LDLCALC", "TRIG", "CHOLHDL", "LDLDIRECT" in the last 72 hours.  Anemia Panel: No results for input(s): "VITAMINB12", "FOLATE", "FERRITIN", "TIBC", "IRON", "RETICCTPCT" in the last 72 hours. Urine analysis:    Component Value Date/Time   COLORURINE AMBER (A) 09/14/2023 1349   APPEARANCEUR CLOUDY (A) 09/14/2023 1349   LABSPEC 1.013 09/14/2023 1349   PHURINE 6.0 09/14/2023  1349   GLUCOSEU NEGATIVE 09/14/2023 1349   HGBUR MODERATE (A) 09/14/2023 1349   BILIRUBINUR NEGATIVE 09/14/2023 1349   KETONESUR NEGATIVE 09/14/2023 1349   PROTEINUR 100 (A) 09/14/2023 1349   NITRITE NEGATIVE 09/14/2023 1349   LEUKOCYTESUR TRACE (A) 09/14/2023 1349   Sepsis Labs: Invalid input(s): "PROCALCITONIN", "LACTICIDVEN"  Microbiology: No results found for this or any previous visit (from the past 240 hours).  Radiology Studies: DG Shoulder Right Result Date: 09/15/2023 CLINICAL DATA:  Right shoulder pain. EXAM: RIGHT SHOULDER - 2+ VIEW COMPARISON:  None Available. FINDINGS: Superior subluxation of the humeral head abutting the undersurface of the acromion. There is associated subcortical cystic change. Mild acromioclavicular degenerative change. No evidence of acute fracture. No focal bone abnormality. No soft tissue calcifications. IMPRESSION: 1. Superior subluxation of the humeral head abutting the undersurface of the  acromion, consistent with chronic rotator cuff tear. 2. Mild acromioclavicular degenerative change. Electronically Signed   By: Narda Rutherford M.D.   On: 09/15/2023 19:58    Scheduled Meds:  apixaban  5 mg Oral BID   atorvastatin  80 mg Oral QODAY   LORazepam  1 mg Oral BID   mometasone-formoterol  2 puff Inhalation BID   multivitamin with minerals  1 tablet Oral Q breakfast   pantoprazole  40 mg Oral Daily   sodium chloride flush  3 mL Intravenous Q12H   venlafaxine XR  75 mg Oral Q breakfast   Continuous Infusions:  sodium chloride        LOS: 15 days   35 minutes with more than 50% spent in reviewing records, counseling patient/family and coordinating care.  Tobey Grim, MD [facility] [email or web address] 09/16/2023, 10:52 AM

## 2023-09-16 NOTE — Progress Notes (Signed)
 Patient ID: Jordan [name], male   DOB: April 16, 1951, 73 y.o.   MRN: [id number] S: No new complaints.  Unfortunately, none of his labs were obtained this morning, nor was his NS IVF at 74 mL/hr started yesterday.  Unclear why these were not started. O:BP 114/73   Pulse (!) 102   Temp 97.8 F (36.6 C) (Oral)   Resp 16   Ht 6\' 1"  (1.854 m)   Wt 101.2 kg   SpO2 94%   BMI 29.44 kg/m   Intake/Output Summary (Last 24 hours) at 09/16/2023 1047 Last data filed at 09/16/2023 0600 Gross per 24 hour  Intake 1690 ml  Output 850 ml  Net 840 ml   Intake/Output: I/O last 3 completed shifts: In: 2748.8 [P.O.:2040; I.V.:708.8] Out: 1850 [Urine:1850]  Intake/Output this shift:  No intake/output data recorded. Weight change: 2 kg Gen: frail elderly WM in NAD CVS: tachy Resp: CTA Abd: +BS, soft, NT/ND Ext: no edema, no change to lower ext ulcers.   Recent Labs  Lab 09/10/23 0457 09/11/23 0419 09/12/23 0719 09/13/23 0518 09/14/23 0450 09/15/23 0415  NA 137 139 137 137 135 135  K 3.8 3.7 3.4* 3.8 3.7 3.7  CL 101 99 96* 98 98 96*  CO2 26 29 31 28 29 27   GLUCOSE 91 88 94 94 100* 88  BUN 57* 62* 64* 61* 62* 66*  CREATININE 2.22* 2.28* 2.22* 2.08* 2.19* 2.66*  ALBUMIN 2.1* 2.8* 2.5* 2.6* 2.3* 2.3*  CALCIUM 8.0* 8.3* 8.1* 8.3* 8.3* 8.3*  PHOS 4.7* 4.9* 4.7* 4.2 3.9 4.8*   Liver Function Tests: Recent Labs  Lab 09/13/23 0518 09/14/23 0450 09/15/23 0415  ALBUMIN 2.6* 2.3* 2.3*   No results for input(s): "LIPASE", "AMYLASE" in the last 168 hours. No results for input(s): "AMMONIA" in the last 168 hours. CBC: Recent Labs  Lab 09/11/23 0419 09/11/23 1431 09/12/23 0722 09/13/23 0518 09/14/23 0450 09/15/23 0413  WBC 8.0  --  9.2 9.9 9.0 10.1  HGB 6.7*   < > 7.7* 7.5* 7.6* 7.7*  HCT 23.0*   < > 26.9* 26.3* 26.3* 26.7*  MCV 75.9*  --  78.2* 79.7* 79.2* 80.2  PLT 353  --  342 353 349 372   < > = values in this interval not displayed.   Cardiac Enzymes: No results for input(s):  "CKTOTAL", "CKMB", "CKMBINDEX", "TROPONINI" in the last 168 hours. CBG: Recent Labs  Lab 09/12/23 2026  GLUCAP 105*    Iron Studies: No results for input(s): "IRON", "TIBC", "TRANSFERRIN", "FERRITIN" in the last 72 hours. Studies/Results: DG Shoulder Right Result Date: 09/15/2023 CLINICAL DATA:  Right shoulder pain. EXAM: RIGHT SHOULDER - 2+ VIEW COMPARISON:  None Available. FINDINGS: Superior subluxation of the humeral head abutting the undersurface of the acromion. There is associated subcortical cystic change. Mild acromioclavicular degenerative change. No evidence of acute fracture. No focal bone abnormality. No soft tissue calcifications. IMPRESSION: 1. Superior subluxation of the humeral head abutting the undersurface of the acromion, consistent with chronic rotator cuff tear. 2. Mild acromioclavicular degenerative change. Electronically Signed   By: Narda Rutherford M.D.   On: 09/15/2023 19:58    apixaban  5 mg Oral BID   atorvastatin  80 mg Oral QODAY   LORazepam  1 mg Oral BID   mometasone-formoterol  2 puff Inhalation BID   multivitamin with minerals  1 tablet Oral Q breakfast   pantoprazole  40 mg Oral Daily   sodium chloride flush  3 mL Intravenous Q12H   venlafaxine  XR  75 mg Oral Q breakfast    BMET    Component Value Date/Time   NA 135 09/15/2023 0415   NA 139 07/02/2021 1322   K 3.7 09/15/2023 0415   CL 96 (L) 09/15/2023 0415   CO2 27 09/15/2023 0415   GLUCOSE 88 09/15/2023 0415   BUN 66 (H) 09/15/2023 0415   BUN 26 07/02/2021 1322   CREATININE 2.66 (H) 09/15/2023 0415   CALCIUM 8.3 (L) 09/15/2023 0415   GFRNONAA 25 (L) 09/15/2023 0415   GFRAA 65 07/23/2020 1533   CBC    Component Value Date/Time   WBC 10.1 09/15/2023 0413   RBC 3.33 (L) 09/15/2023 0413   HGB 7.7 (L) 09/15/2023 0413   HGB 11.0 (L) 07/02/2021 1322   HCT 26.7 (L) 09/15/2023 0413   HCT 35.0 (L) 07/02/2021 1322   PLT 372 09/15/2023 0413   PLT 266 07/02/2021 1322   MCV 80.2 09/15/2023 0413    MCV 81 07/02/2021 1322   MCH 23.1 (L) 09/15/2023 0413   MCHC 28.8 (L) 09/15/2023 0413   RDW 24.9 (H) 09/15/2023 0413   RDW 15.3 07/02/2021 1322   LYMPHSABS 1.2 09/04/2023 0407   MONOABS 0.8 09/04/2023 0407   EOSABS 0.4 09/04/2023 0407   BASOSABS 0.0 09/04/2023 0407    Assessment/ Plan: AKI: b/l creat is 1.3, from PCP's office in Jan 2024. Assuming this is AKI but could also have CKD. UA shows some rbc's/ wbc's, no proteinuria. Renal US w/o obstruction. BP's were soft in 90s on presentation and then improved after IV abx and IVF's, but pt developed pulm edema on CXR #2 w/ bilat LE edema. Suspect CKD and/or ATN / vol overload as cause of renal failure. Started IV lasix 3/19, titrating up the dose and the UOP is improving. 4.4 L UOP snd creat stable in low 2's. Cont diuresis. He dropped his Bp on the afternoon of 3/23 so Lasix was decreased to 80 mg IV tid.  Zaroxolyn on hold.  Bp was low again and IV lasix was stopped 09/11/23.  UOP has dropped but Scr stable.  We restarted po lasix yesteday with increased UOP but unfortunately the Scr rose to 2.66 yestrerday.  Po lasix was stopped and I ordered IVF's NS at 75 mL/hr, however it was never started, nor his labs drawn for unclear reasons.  Will reorder IVF's and labs.  His urine remains dark and he appears volume depleted.  He did have a renal CO2 angiogram which was negative for renal artery stenosis.   Soft tissue infection: SP IV abx, is now on po keflex Left leg ulcer - normal left leg angiogram Anemia - hgb down to 6.7.  received blood transfusion yesterday and is up to 7.7.  COPD Atrial fib S/p TAVR Chronic resp failure - on home O2 2 L HTN - bp's low, doesn't need atenolol now, will dc. Disposition - will need SNF prior to going back to assisted living at [facility].    Irena Cords, MD [employer] [phone]

## 2023-09-16 NOTE — Plan of Care (Signed)
  Problem: Education: Goal: Knowledge of General Education information will improve Description: Including pain rating scale, medication(s)/side effects and non-pharmacologic comfort measures Outcome: Progressing   Problem: Health Behavior/Discharge Planning: Goal: Ability to manage health-related needs will improve Outcome: Progressing   Problem: Clinical Measurements: Goal: Ability to maintain clinical measurements within normal limits will improve Outcome: Progressing Goal: Will remain free from infection Outcome: Progressing Goal: Diagnostic test results will improve Outcome: Progressing Goal: Respiratory complications will improve Outcome: Progressing Goal: Cardiovascular complication will be avoided Outcome: Progressing   Problem: Activity: Goal: Risk for activity intolerance will decrease Outcome: Progressing   Problem: Nutrition: Goal: Adequate nutrition will be maintained Outcome: Progressing   Problem: Coping: Goal: Level of anxiety will decrease Outcome: Progressing   Problem: Elimination: Goal: Will not experience complications related to bowel motility Outcome: Progressing Goal: Will not experience complications related to urinary retention Outcome: Progressing   Problem: Pain Managment: Goal: General experience of comfort will improve and/or be controlled Outcome: Progressing   Problem: Safety: Goal: Ability to remain free from injury will improve Outcome: Progressing   Problem: Skin Integrity: Goal: Risk for impaired skin integrity will decrease Outcome: Progressing   Problem: Clinical Measurements: Goal: Ability to avoid or minimize complications of infection will improve Outcome: Progressing   Problem: Skin Integrity: Goal: Skin integrity will improve Outcome: Progressing   Problem: Education: Goal: Understanding of CV disease, CV risk reduction, and recovery process will improve Outcome: Progressing Goal: Individualized Educational  Video(s) Outcome: Progressing   Problem: Activity: Goal: Ability to return to baseline activity level will improve Outcome: Progressing   Problem: Cardiovascular: Goal: Ability to achieve and maintain adequate cardiovascular perfusion will improve Outcome: Progressing Goal: Vascular access site(s) Level 0-1 will be maintained Outcome: Progressing   Problem: Health Behavior/Discharge Planning: Goal: Ability to safely manage health-related needs after discharge will improve Outcome: Progressing

## 2023-09-17 DIAGNOSIS — L089 Local infection of the skin and subcutaneous tissue, unspecified: Secondary | ICD-10-CM | POA: Diagnosis not present

## 2023-09-17 LAB — RENAL FUNCTION PANEL
Albumin: 2.1 g/dL — ABNORMAL LOW (ref 3.5–5.0)
Anion gap: 9 (ref 5–15)
BUN: 73 mg/dL — ABNORMAL HIGH (ref 8–23)
CO2: 27 mmol/L (ref 22–32)
Calcium: 7.9 mg/dL — ABNORMAL LOW (ref 8.9–10.3)
Chloride: 100 mmol/L (ref 98–111)
Creatinine, Ser: 3.53 mg/dL — ABNORMAL HIGH (ref 0.61–1.24)
GFR, Estimated: 17 mL/min — ABNORMAL LOW (ref >60)
Glucose, Bld: 91 mg/dL (ref 70–99)
Phosphorus: 5.4 mg/dL — ABNORMAL HIGH (ref 2.5–4.6)
Potassium: 3.8 mmol/L (ref 3.5–5.1)
Sodium: 136 mmol/L (ref 135–145)

## 2023-09-17 LAB — BASIC METABOLIC PANEL WITH GFR
Anion gap: 8 (ref 5–15)
BUN: 71 mg/dL — ABNORMAL HIGH (ref 8–23)
CO2: 27 mmol/L (ref 22–32)
Calcium: 7.9 mg/dL — ABNORMAL LOW (ref 8.9–10.3)
Chloride: 100 mmol/L (ref 98–111)
Creatinine, Ser: 3.63 mg/dL — ABNORMAL HIGH (ref 0.61–1.24)
GFR, Estimated: 17 mL/min — ABNORMAL LOW (ref >60)
Glucose, Bld: 90 mg/dL (ref 70–99)
Potassium: 3.6 mmol/L (ref 3.5–5.1)
Sodium: 135 mmol/L (ref 135–145)

## 2023-09-17 LAB — CBC
HCT: 25.7 % — ABNORMAL LOW (ref 39.0–52.0)
Hemoglobin: 7.2 g/dL — ABNORMAL LOW (ref 13.0–17.0)
MCH: 23.1 pg — ABNORMAL LOW (ref 26.0–34.0)
MCHC: 28 g/dL — ABNORMAL LOW (ref 30.0–36.0)
MCV: 82.4 fL (ref 80.0–100.0)
Platelets: 281 10*3/uL (ref 150–400)
RBC: 3.12 MIL/uL — ABNORMAL LOW (ref 4.22–5.81)
RDW: 25.2 % — ABNORMAL HIGH (ref 11.5–15.5)
WBC: 8.8 10*3/uL (ref 4.0–10.5)
nRBC: 0 % (ref 0.0–0.2)

## 2023-09-17 MED ORDER — SODIUM CHLORIDE 0.9 % IV SOLN: INTRAVENOUS | Status: DC

## 2023-09-17 NOTE — Plan of Care (Signed)
  Problem: Education: Goal: Knowledge of General Education information will improve Description: Including pain rating scale, medication(s)/side effects and non-pharmacologic comfort measures Outcome: Progressing   Problem: Health Behavior/Discharge Planning: Goal: Ability to manage health-related needs will improve Outcome: Progressing   Problem: Clinical Measurements: Goal: Ability to maintain clinical measurements within normal limits will improve Outcome: Progressing Goal: Will remain free from infection Outcome: Progressing Goal: Diagnostic test results will improve Outcome: Progressing Goal: Respiratory complications will improve Outcome: Progressing Goal: Cardiovascular complication will be avoided Outcome: Progressing   Problem: Activity: Goal: Risk for activity intolerance will decrease Outcome: Progressing   Problem: Nutrition: Goal: Adequate nutrition will be maintained Outcome: Progressing   Problem: Coping: Goal: Level of anxiety will decrease Outcome: Progressing   Problem: Elimination: Goal: Will not experience complications related to bowel motility Outcome: Progressing Goal: Will not experience complications related to urinary retention Outcome: Progressing   Problem: Pain Managment: Goal: General experience of comfort will improve and/or be controlled Outcome: Progressing   Problem: Safety: Goal: Ability to remain free from injury will improve Outcome: Progressing   Problem: Skin Integrity: Goal: Risk for impaired skin integrity will decrease Outcome: Progressing   Problem: Clinical Measurements: Goal: Ability to avoid or minimize complications of infection will improve Outcome: Progressing   Problem: Skin Integrity: Goal: Skin integrity will improve Outcome: Progressing   Problem: Education: Goal: Understanding of CV disease, CV risk reduction, and recovery process will improve Outcome: Progressing Goal: Individualized Educational  Video(s) Outcome: Progressing   Problem: Activity: Goal: Ability to return to baseline activity level will improve Outcome: Progressing   Problem: Cardiovascular: Goal: Ability to achieve and maintain adequate cardiovascular perfusion will improve Outcome: Progressing Goal: Vascular access site(s) Level 0-1 will be maintained Outcome: Progressing   Problem: Health Behavior/Discharge Planning: Goal: Ability to safely manage health-related needs after discharge will improve Outcome: Progressing

## 2023-09-17 NOTE — Progress Notes (Signed)
 PROGRESS NOTE  TARUS BRISKI  WGN:[id number] DOB: September 28, 1950 DOA: 09/01/2023 PCP: Kennith Center, NP  Consultants  Brief Narrative: 73 year old with past medical history significant for hypertension, Heart failure preserved ejection fraction, A-fib, CAD, COPD with chronic hypoxic respiratory failure on 2 L of oxygen, chronic venous stasis, PVD admitted 3/17 for worsening cellulitis failing outpatient antibiotics, after injury at nursing home previous week.    Hospital course complicated by AKI on CKD, pulmonary edema, hypotension required IV albumin and discontinuation of atenolol.  Due to concern for peripheral vascular disease and lower extremity wounds he underwent arteriogram by Dr. Myra Gianotti on 3/25, which showed no significant aortoiliac occlusive disease or femoropopliteal disease, anterior tibial dominant vessel perfusing to the ankle and forefoot.  Should have adequate blood flow for wound healing.  Elevated creatinine is main thing keeping him in the hospital.       Assessment & Plan:  AKI, on ? CKD -Creatinine now 3.5, from 2.63/28. -IV fluids started yesterday and plan will be continued through today to make up for overdiuresis. -Legs are unchanged without lower extremity edema. -Lungs without crackles today on exam. -Recheck renal panel in the morning.  -Watch closely for any signs of fluid overload  Soft tissue infection, LE infection, ulcers.  Chronic venous stasis disease PVD -Continue local care.  Doing well -Completed 10 days Keflex, He will complete 5 days of Doxy on 3/25.  Abx now finished. -Vascular consulted. Underwent arteriogram 3/25, no insignificant PVD, has adequate blood flow for wound healing.  -Redness less intense. Needs to follow up with Dr Lajoyce Corners out patient.   Hypokalemia:  Resolved.    Hypertension://Hypotension.  Holding  atenolol to avoid hypotension Had soft BP, lasix held.  Received IV albumin. Vitals good currently.   A-fib: Restarted  eliquis 3/26 after on heparin for procedure 3/25. Atenolol discontinue by nephrology due to hypotension.  -Heart rate irregular today and changes from high 90s to low 100s.  If persists will restart low-dose metoprolol as his blood pressure has improved   COPD Chronic hypoxic respiratory failure on 2 L of oxygen No further wheezing on exam.  No crackles   Anxiety depression: Continue Effexor and Ativan   Anemia; iron deficiency:  Received  IV iron 3/23. Hemoglobin hovering around 7.  He is already status post 1 unit packed red blood cells this admission. -Following       Pressure Injury 09/02/23 Heel Left Unstageable - Full thickness tissue loss in which the base of the injury is covered by slough (yellow, tan, gray, green or brown) and/or eschar (tan, brown or black) in the wound bed. (Active)  09/02/23 1713  Location: Heel  Location Orientation: Left  Staging: Unstageable - Full thickness tissue loss in which the base of the injury is covered by slough (yellow, tan, gray, green or brown) and/or eschar (tan, brown or black) in the wound bed.  Wound Description (Comments):   Present on Admission: Yes  Dressing Type Foam - Lift dressing to assess site every shift 09/17/23 0900   DVT prophylaxis:  apixaban (ELIQUIS) tablet 5 mg  Code Status:   Code Status: Full Code Level of care: Med-Surg Status is: Inpatient Dispo: To SNF before ALF when medically ready and creatinine issues resolve   Consults called: Nephrology   Subjective: Patient awake and alert today.  No complaints.  Eating and drinking well.  Sitting up in bed.  Objective: Vitals:   09/17/23 0500 09/17/23 0604 09/17/23 0902 09/17/23 1335  BP:  122/77  128/74  Pulse:  (!) 107  (!) 109  Resp:  16  18  Temp:  97.8 F (36.6 C)  (!) 97.4 F (36.3 C)  TempSrc:  Oral  Oral  SpO2:  91% 94% 97%  Weight: 102.1 kg     Height:        Intake/Output Summary (Last 24 hours) at 09/17/2023 1531 Last data filed at  09/17/2023 1337 Gross per 24 hour  Intake 970 ml  Output 2250 ml  Net -1280 ml   Filed Weights   09/15/23 0500 09/16/23 0500 09/17/23 0500  Weight: 99.2 kg 101.2 kg 102.1 kg   Body mass index is 29.7 kg/m.  Gen: 73 y.o. male in no apparent distress.  Nontoxic Pulm: Non-labored breathing.  Clear to auscultation bilaterally.  CV: Regular rate and rhythm. No murmur, rub, or gallop. No JVD GI: Abdomen soft, non-tender, non-distended, with normoactive bowel sounds Extremities: BL LE ulceration, toe with ulceration LE with less intense  redness. Hyperpigmentation BL changes consistent with chronic venous insufficiency.  Slowly healing.   GU: Condom cath in place.  Notably dark urine in minimal amount in Foley bag.  Tech in room said that she had emptied it about 3 hours prior to my exam Neuro: Alert and oriented. No focal neurological deficits. Psych: Calm  Judgement and insight appear normal. Mood & affect appropriate.     I have personally reviewed the following labs and images: CBC: Recent Labs  Lab 09/13/23 0518 09/14/23 0450 09/15/23 0413 09/16/23 1100 09/17/23 0529  WBC 9.9 9.0 10.1 7.8 8.8  HGB 7.5* 7.6* 7.7* 7.0* 7.2*  HCT 26.3* 26.3* 26.7* 25.2* 25.7*  MCV 79.7* 79.2* 80.2 81.6 82.4  PLT 353 349 372 301 281   BMP &GFR Recent Labs  Lab 09/13/23 0518 09/14/23 0450 09/15/23 0415 09/16/23 1100 09/17/23 0529 09/17/23 0530  NA 137 135 135 134* 135 136  K 3.8 3.7 3.7 3.5 3.6 3.8  CL 98 98 96* 100 100 100  CO2 28 29 27 24 27 27   GLUCOSE 94 100* 88 108* 90 91  BUN 61* 62* 66* 69* 71* 73*  CREATININE 2.08* 2.19* 2.66* 3.35* 3.63* 3.53*  CALCIUM 8.3* 8.3* 8.3* 7.7* 7.9* 7.9*  PHOS 4.2 3.9 4.8* 5.0*  --  5.4*   Estimated Creatinine Clearance: 23.4 mL/min (A) (by C-G formula based on SCr of 3.53 mg/dL (H)). Liver & Pancreas: Recent Labs  Lab 09/13/23 0518 09/14/23 0450 09/15/23 0415 09/16/23 1100 09/17/23 0530  ALBUMIN 2.6* 2.3* 2.3* 2.1* 2.1*   No results for  input(s): "LIPASE", "AMYLASE" in the last 168 hours. No results for input(s): "AMMONIA" in the last 168 hours. Diabetic: No results for input(s): "HGBA1C" in the last 72 hours. Recent Labs  Lab 09/12/23 2026  GLUCAP 105*   Cardiac Enzymes: No results for input(s): "CKTOTAL", "CKMB", "CKMBINDEX", "TROPONINI" in the last 168 hours. No results for input(s): "PROBNP" in the last 8760 hours. Coagulation Profile: No results for input(s): "INR", "PROTIME" in the last 168 hours. Thyroid Function Tests: No results for input(s): "TSH", "T4TOTAL", "FREET4", "T3FREE", "THYROIDAB" in the last 72 hours. Lipid Profile: No results for input(s): "CHOL", "HDL", "LDLCALC", "TRIG", "CHOLHDL", "LDLDIRECT" in the last 72 hours.  Anemia Panel: No results for input(s): "VITAMINB12", "FOLATE", "FERRITIN", "TIBC", "IRON", "RETICCTPCT" in the last 72 hours. Urine analysis:    Component Value Date/Time   COLORURINE AMBER (A) 09/14/2023 1349   APPEARANCEUR CLOUDY (A) 09/14/2023 1349   LABSPEC 1.013 09/14/2023 1349  PHURINE 6.0 09/14/2023 1349   GLUCOSEU NEGATIVE 09/14/2023 1349   HGBUR MODERATE (A) 09/14/2023 1349   BILIRUBINUR NEGATIVE 09/14/2023 1349   KETONESUR NEGATIVE 09/14/2023 1349   PROTEINUR 100 (A) 09/14/2023 1349   NITRITE NEGATIVE 09/14/2023 1349   LEUKOCYTESUR TRACE (A) 09/14/2023 1349   Sepsis Labs: Invalid input(s): "PROCALCITONIN", "LACTICIDVEN"  Microbiology: No results found for this or any previous visit (from the past 240 hours).  Radiology Studies: No results found.   Scheduled Meds:  apixaban  5 mg Oral BID   atorvastatin  80 mg Oral QODAY   LORazepam  1 mg Oral BID   mometasone-formoterol  2 puff Inhalation BID   multivitamin with minerals  1 tablet Oral Q breakfast   pantoprazole  40 mg Oral Daily   sodium chloride flush  3 mL Intravenous Q12H   venlafaxine XR  75 mg Oral Q breakfast   Continuous Infusions:  sodium chloride 100 mL/hr at 09/17/23 1425      LOS:  16 days   35 minutes with more than 50% spent in reviewing records, counseling patient/family and coordinating care.  Tobey Grim, MD [facility] [email or web address] 09/17/2023, 3:31 PM

## 2023-09-17 NOTE — Progress Notes (Signed)
 Patient ID: Jordan [name], male   DOB: 11/02/1950, 73 y.o.   MRN: [id number] S: No new complaints this morning. O:BP 122/77 (BP Location: Left Arm)   Pulse (!) 107   Temp 97.8 F (36.6 C) (Oral)   Resp 16   Ht 6\' 1"  (1.854 m)   Wt 102.1 kg   SpO2 94%   BMI 29.70 kg/m   Intake/Output Summary (Last 24 hours) at 09/17/2023 1141 Last data filed at 09/17/2023 1000 Gross per 24 hour  Intake 1475 ml  Output 2500 ml  Net -1025 ml   Intake/Output: I/O last 3 completed shifts: In: 1725 [P.O.:1340; I.V.:385] Out: 2750 [Urine:2750]  Intake/Output this shift:  Total I/O In: 120 [P.O.:120] Out: 450 [Urine:450] Weight change: 0.9 kg Gen: NAD CVS: tachy Resp: CTA Abd: +BS, soft, NT/ND Ext: no edema, bandages on BLLE  Recent Labs  Lab 09/11/23 0419 09/12/23 0719 09/13/23 0518 09/14/23 0450 09/15/23 0415 09/16/23 1100 09/17/23 0529 09/17/23 0530  NA 139 137 137 135 135 134* 135 136  K 3.7 3.4* 3.8 3.7 3.7 3.5 3.6 3.8  CL 99 96* 98 98 96* 100 100 100  CO2 29 31 28 29 27 24 27 27   GLUCOSE 88 94 94 100* 88 108* 90 91  BUN 62* 64* 61* 62* 66* 69* 71* 73*  CREATININE 2.28* 2.22* 2.08* 2.19* 2.66* 3.35* 3.63* 3.53*  ALBUMIN 2.8* 2.5* 2.6* 2.3* 2.3* 2.1*  --  2.1*  CALCIUM 8.3* 8.1* 8.3* 8.3* 8.3* 7.7* 7.9* 7.9*  PHOS 4.9* 4.7* 4.2 3.9 4.8* 5.0*  --  5.4*   Liver Function Tests: Recent Labs  Lab 09/15/23 0415 09/16/23 1100 09/17/23 0530  ALBUMIN 2.3* 2.1* 2.1*   No results for input(s): "LIPASE", "AMYLASE" in the last 168 hours. No results for input(s): "AMMONIA" in the last 168 hours. CBC: Recent Labs  Lab 09/13/23 0518 09/14/23 0450 09/15/23 0413 09/16/23 1100 09/17/23 0529  WBC 9.9 9.0 10.1 7.8 8.8  HGB 7.5* 7.6* 7.7* 7.0* 7.2*  HCT 26.3* 26.3* 26.7* 25.2* 25.7*  MCV 79.7* 79.2* 80.2 81.6 82.4  PLT 353 349 372 301 281   Cardiac Enzymes: No results for input(s): "CKTOTAL", "CKMB", "CKMBINDEX", "TROPONINI" in the last 168 hours. CBG: Recent Labs  Lab  09/12/23 2026  GLUCAP 105*    Iron Studies: No results for input(s): "IRON", "TIBC", "TRANSFERRIN", "FERRITIN" in the last 72 hours. Studies/Results: DG Shoulder Right Result Date: 09/15/2023 CLINICAL DATA:  Right shoulder pain. EXAM: RIGHT SHOULDER - 2+ VIEW COMPARISON:  None Available. FINDINGS: Superior subluxation of the humeral head abutting the undersurface of the acromion. There is associated subcortical cystic change. Mild acromioclavicular degenerative change. No evidence of acute fracture. No focal bone abnormality. No soft tissue calcifications. IMPRESSION: 1. Superior subluxation of the humeral head abutting the undersurface of the acromion, consistent with chronic rotator cuff tear. 2. Mild acromioclavicular degenerative change. Electronically Signed   By: Narda Rutherford M.D.   On: 09/15/2023 19:58    apixaban  5 mg Oral BID   atorvastatin  80 mg Oral QODAY   LORazepam  1 mg Oral BID   mometasone-formoterol  2 puff Inhalation BID   multivitamin with minerals  1 tablet Oral Q breakfast   pantoprazole  40 mg Oral Daily   sodium chloride flush  3 mL Intravenous Q12H   venlafaxine XR  75 mg Oral Q breakfast    BMET    Component Value Date/Time   NA 136 09/17/2023 0530   NA 139  07/02/2021 1322   K 3.8 09/17/2023 0530   CL 100 09/17/2023 0530   CO2 27 09/17/2023 0530   GLUCOSE 91 09/17/2023 0530   BUN 73 (H) 09/17/2023 0530   BUN 26 07/02/2021 1322   CREATININE 3.53 (H) 09/17/2023 0530   CALCIUM 7.9 (L) 09/17/2023 0530   GFRNONAA 17 (L) 09/17/2023 0530   GFRAA 65 07/23/2020 1533   CBC    Component Value Date/Time   WBC 8.8 09/17/2023 0529   RBC 3.12 (L) 09/17/2023 0529   HGB 7.2 (L) 09/17/2023 0529   HGB 11.0 (L) 07/02/2021 1322   HCT 25.7 (L) 09/17/2023 0529   HCT 35.0 (L) 07/02/2021 1322   PLT 281 09/17/2023 0529   PLT 266 07/02/2021 1322   MCV 82.4 09/17/2023 0529   MCV 81 07/02/2021 1322   MCH 23.1 (L) 09/17/2023 0529   MCHC 28.0 (L) 09/17/2023 0529    RDW 25.2 (H) 09/17/2023 0529   RDW 15.3 07/02/2021 1322   LYMPHSABS 1.2 09/04/2023 0407   MONOABS 0.8 09/04/2023 0407   EOSABS 0.4 09/04/2023 0407   BASOSABS 0.0 09/04/2023 0407    Assessment/ Plan: AKI: b/l creat is 1.3, from PCP's office in Jan 2024. Assuming this is AKI but could also have CKD. UA shows some rbc's/ wbc's, no proteinuria. Renal US w/o obstruction. BP's were soft in 90s on presentation and then improved after IV abx and IVF's, but pt developed pulm edema on CXR #2 w/ bilat LE edema. Suspect CKD and/or ATN / vol overload as cause of renal failure. Started IV lasix 3/19, titrating up the dose and the UOP is improving. 4.4 L UOP snd creat stable in low 2's. Cont diuresis. He dropped his Bp on the afternoon of 3/23 so Lasix was decreased to 80 mg IV tid.  Zaroxolyn on hold.  Bp was low again and IV lasix was stopped 09/11/23.  UOP has dropped but Scr stable.  We restarted po lasix yesteday with increased UOP but unfortunately the Scr rose to 2.66 yestrerday.  Po lasix was stopped and I ordered IVF's NS at 75 mL/hr, however it was never started, nor his labs drawn for unclear reasons yesterday.  Continue with IVF's as he was likely over diuresed.  His urine is lighter today but has some blood in it.  He did have a renal CO2 angiogram which was negative for renal artery stenosis. Avoid nephrotoxic medications including NSAIDs and iodinated intravenous contrast exposure unless the latter is absolutely indicated.   Preferred narcotic agents for pain control are hydromorphone, fentanyl, and methadone. Morphine should not be used.  Avoid Baclofen and avoid oral sodium phosphate and magnesium citrate based laxatives / bowel preps.  Continue strict Input and Output monitoring.  Will monitor the patient closely with you and intervene or adjust therapy as indicated by changes in clinical status/labs   Soft tissue infection: SP IV abx, is now on po keflex Left leg ulcer - normal left leg  angiogram Anemia - hgb down to 6.7.  received blood transfusion yesterday and is up to 7.7.  COPD Atrial fib S/p TAVR Chronic resp failure - on home O2 2 L HTN - bp's low, doesn't need atenolol now, will dc. Disposition - will need SNF prior to going back to assisted living at [facility].  Irena Cords, MD [employer] [phone]

## 2023-09-18 ENCOUNTER — Inpatient Hospital Stay (HOSPITAL_COMMUNITY)

## 2023-09-18 DIAGNOSIS — L089 Local infection of the skin and subcutaneous tissue, unspecified: Secondary | ICD-10-CM | POA: Diagnosis not present

## 2023-09-18 LAB — RENAL FUNCTION PANEL
Albumin: 2.3 g/dL — ABNORMAL LOW (ref 3.5–5.0)
Anion gap: 9 (ref 5–15)
BUN: 76 mg/dL — ABNORMAL HIGH (ref 8–23)
CO2: 24 mmol/L (ref 22–32)
Calcium: 7.9 mg/dL — ABNORMAL LOW (ref 8.9–10.3)
Chloride: 101 mmol/L (ref 98–111)
Creatinine, Ser: 4.16 mg/dL — ABNORMAL HIGH (ref 0.61–1.24)
GFR, Estimated: 14 mL/min — ABNORMAL LOW (ref >60)
Glucose, Bld: 91 mg/dL (ref 70–99)
Phosphorus: 6.1 mg/dL — ABNORMAL HIGH (ref 2.5–4.6)
Potassium: 3.6 mmol/L (ref 3.5–5.1)
Sodium: 134 mmol/L — ABNORMAL LOW (ref 135–145)

## 2023-09-18 LAB — CBC
HCT: 27 % — ABNORMAL LOW (ref 39.0–52.0)
Hemoglobin: 7.7 g/dL — ABNORMAL LOW (ref 13.0–17.0)
MCH: 23.5 pg — ABNORMAL LOW (ref 26.0–34.0)
MCHC: 28.5 g/dL — ABNORMAL LOW (ref 30.0–36.0)
MCV: 82.6 fL (ref 80.0–100.0)
Platelets: 250 10*3/uL (ref 150–400)
RBC: 3.27 MIL/uL — ABNORMAL LOW (ref 4.22–5.81)
RDW: 25.3 % — ABNORMAL HIGH (ref 11.5–15.5)
WBC: 8.6 10*3/uL (ref 4.0–10.5)
nRBC: 0 % (ref 0.0–0.2)

## 2023-09-18 MED ORDER — SEVELAMER CARBONATE 800 MG PO TABS
800.0000 mg | ORAL_TABLET | Freq: Three times a day (TID) | ORAL | Status: DC
  Administered 2023-09-18 – 2023-10-02 (×37): 800 mg via ORAL
  Filled 2023-09-18 (×36): qty 1

## 2023-09-18 MED ORDER — METOPROLOL TARTRATE 12.5 MG HALF TABLET
12.5000 mg | ORAL_TABLET | Freq: Two times a day (BID) | ORAL | Status: DC
Start: 2023-09-18 — End: 2023-09-20
  Administered 2023-09-18 – 2023-09-20 (×5): 12.5 mg via ORAL
  Filled 2023-09-18 (×5): qty 1

## 2023-09-18 NOTE — Plan of Care (Signed)
   Problem: Education: Goal: Knowledge of General Education information will improve Description: Including pain rating scale, medication(s)/side effects and non-pharmacologic comfort measures Outcome: Progressing   Problem: Coping: Goal: Level of anxiety will decrease Outcome: Progressing

## 2023-09-18 NOTE — Consult Note (Signed)
 Urology Consult Note   Requesting Attending Physician:  Tobey Grim, MD Service Providing Consult: Urology  Consulting Attending: Dr. Cardell Peach   Reason for Consult:  Hematuria  HPI: Jordan [name] is seen in consultation for reasons noted above at the request of Nephrology. Patient initially presented to [facility] for cellulitis, failing outpatient ABX.  PMH significant for HTN, HFpEF, A-fib, CAD, COPD, chronic hypoxic respiratory failure-2LNC, chronic venous stasis, and PVD.  He has remained in hospital d/t acute on chronic kidney disease.  He reports about 10 years of cigarette smoking going between 2ppd and 2 cigarettes. He denies previous hematuria or LUTS.   ------------------  Assessment:  74 y.o. male with hematuria on Eliquis   Recommendations: #hematuria Hold Eliquis if medically reasonable. This is preferable prior to surgical consideration.  Pt cannot tolerate CT hematuria d/t kidney injury.  Will need outpt cystoscopy at some point.  His UA was not remarkable on 3/27, though there was rare bacteria. Sample has been disposed of. New UA/culture would be color contaminated. Reasonable to treat him for UTI empirically.  Consider gentle hydration and watchful waiting.  Trend labs. HgB has improved since yesterday.  Failing the above, can consider cystoscopy with fulguration.   Case and plan discussed with Dr. 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 Excell Seltzer and Dr. Laneta Simmers   Severe aortic stenosis    Severe aortic insufficiency, moderate to severe aortic stenosis   Sleep apnea    no longer uses cpap   Venous insufficiency    managed by ortho (Dr. Lajoyce Corners)    Past Surgical History:  Past Surgical History:  Procedure Laterality Date   ABDOMINAL AORTOGRAM N/A 09/12/2023   Procedure: ABDOMINAL AORTOGRAM;  Surgeon: Nada Libman, MD;  Location: [facility] INVASIVE CV LAB;  Service: Cardiovascular;  Laterality: N/A;   ANKLE SURGERY Right    Dr. Carola Frost, has pins   CARDIOVERSION N/A 07/16/2021   Procedure: CARDIOVERSION;  Surgeon: Thomasene Ripple, DO;  Location: [facility] ENDOSCOPY;  Service: Cardiovascular;  Laterality: N/A;   COLONOSCOPY W/ POLYPECTOMY     KNEE ARTHROSCOPY Right    Dr. Thurston Hole   LOWER EXTREMITY ANGIOGRAPHY N/A 09/12/2023   Procedure: Lower Extremity Angiography;  Surgeon: Nada Libman, MD;  Location: [facility] INVASIVE CV LAB;  Service: Cardiovascular;  Laterality: N/A;   MULTIPLE EXTRACTIONS WITH ALVEOLOPLASTY Bilateral 05/22/2020   Procedure: MULTIPLE EXTRACTION WITH ALVEOLOPLASTY;  Surgeon: Ocie Doyne, DMD;  Location: [facility] OR;  Service: Oral Surgery;  Laterality: Bilateral;   right foot surgery,rt great toe callus removal     RIGHT/LEFT HEART CATH AND CORONARY ANGIOGRAPHY N/A 03/06/2020   Procedure: RIGHT/LEFT HEART CATH AND CORONARY ANGIOGRAPHY;  Surgeon: Tonny Bollman, MD;  Location: [facility]  INVASIVE CV LAB;  Service: Cardiovascular;  Laterality: N/A;   TEE WITHOUT CARDIOVERSION N/A 03/09/2020   Procedure: TRANSESOPHAGEAL ECHOCARDIOGRAM (TEE);  Surgeon: Lars Masson, MD;  Location: [facility] ENDOSCOPY;  Service: Cardiovascular;  Laterality: N/A;   TEE WITHOUT CARDIOVERSION N/A 06/30/2020   Procedure: TRANSESOPHAGEAL ECHOCARDIOGRAM (TEE);  Surgeon: Tonny Bollman, MD;  Location: [facility] OR;  Service: Open Heart Surgery;  Laterality: N/A;   TONSILLECTOMY AND  ADENOIDECTOMY     age 65   TOTAL KNEE ARTHROPLASTY Right 02/07/2022   Procedure: TOTAL KNEE ARTHROPLASTY;  Surgeon: Ollen Gross, MD;  Location: [facility] ORS;  Service: Orthopedics;  Laterality: Right;   TRANSCATHETER AORTIC VALVE REPLACEMENT, TRANSFEMORAL N/A 06/30/2020   Procedure: TRANSCATHETER AORTIC VALVE REPLACEMENT, TRANSFEMORAL;  Surgeon: Tonny Bollman, MD;  Location: [facility] OR;  Service: Open Heart Surgery;  Laterality: N/A;   UPPER GI ENDOSCOPY      Medication: Current Facility-Administered Medications  Medication Dose Route Frequency Provider Last Rate Last Admin   acetaminophen (TYLENOL) tablet 650 mg  650 mg Oral Q6H PRN Pieter Partridge, MD   650 mg at 09/08/23 2100   Or   acetaminophen (TYLENOL) suppository 650 mg  650 mg Rectal Q6H PRN Pieter Partridge, MD       acetaminophen (TYLENOL) tablet 650 mg  650 mg Oral Q4H PRN Nada Libman, MD       albuterol (PROVENTIL) (2.5 MG/3ML) 0.083% nebulizer solution 2.5 mg  2.5 mg Nebulization Q6H PRN Regalado, Belkys A, MD       alum & mag hydroxide-simeth (MAALOX/MYLANTA) 200-200-20 MG/5ML suspension 30 mL  30 mL Oral Q4H PRN Leandro Reasoner Tublu, MD   30 mL at 09/17/23 1725   apixaban (ELIQUIS) tablet 5 mg  5 mg Oral BID Tobey Grim, MD   5 mg at 09/18/23 0843   atorvastatin (LIPITOR) tablet 80 mg  80 mg Oral Corinna Capra Tublu, MD   80 mg at 09/17/23 4098   camphor-menthol (SARNA) lotion   Topical PRN Luiz Iron, NP       hydrALAZINE (APRESOLINE) injection 5 mg  5 mg Intravenous Q20 Min PRN Nada Libman, MD       LORazepam (ATIVAN) tablet 1 mg  1 mg Oral BID Leandro Reasoner Tublu, MD   1 mg at 09/18/23 1191   melatonin tablet 5 mg  5 mg Oral QHS PRN Leandro Reasoner Tublu, MD   5 mg at 09/17/23 2207   metoprolol tartrate (LOPRESSOR) tablet 12.5 mg  12.5 mg Oral BID Tobey Grim, MD   12.5 mg at 09/18/23 4782   mometasone-formoterol (DULERA) 200-5 MCG/ACT inhaler 2 puff  2  puff Inhalation BID Leandro Reasoner Tublu, MD   2 puff at 09/18/23 0810   multivitamin with minerals tablet 1 tablet  1 tablet Oral Q breakfast Leandro Reasoner Tublu, MD   1 tablet at 09/18/23 0843   ondansetron (ZOFRAN) injection 4 mg  4 mg Intravenous Q8H PRN Leeroy Bock, MD   4 mg at 09/18/23 0550   oxyCODONE (Oxy IR/ROXICODONE) immediate release tablet 5 mg  5 mg Oral BID PRN Leeroy Bock, MD   5 mg at 09/18/23 0842   pantoprazole (PROTONIX) EC tablet 40 mg  40 mg Oral Daily Leandro Reasoner Tublu, MD   40 mg at 09/18/23 0843   polyethylene glycol (MIRALAX / GLYCOLAX) packet 17 g  17 g Oral Daily PRN Pieter Partridge, MD       sevelamer carbonate (RENVELA) tablet  800 mg  800 mg Oral TID WC Tobey Grim, MD   800 mg at 09/18/23 1202   sodium chloride flush (NS) 0.9 % injection 3 mL  3 mL Intravenous Q12H Nada Libman, MD   3 mL at 09/18/23 0846   sodium chloride flush (NS) 0.9 % injection 3 mL  3 mL Intravenous PRN Nada Libman, MD       traMADol Janean Sark) tablet 50 mg  50 mg Oral BID PRN Leandro Reasoner Tublu, MD   50 mg at 09/13/23 2038   venlafaxine XR (EFFEXOR-XR) 24 hr capsule 75 mg  75 mg Oral Q breakfast Leandro Reasoner Tublu, MD   75 mg at 09/18/23 1478    Allergies: Allergies  Allergen Reactions   Altace [Ramipril] Other (See Comments)    Dizziness, migraine, weakness- "there is dye in it"   Codeine Itching   Metoprolol Tartrate Other (See Comments)    Dropped the B/P too low- eventually stopped by a MD   Naproxen Itching    Social History: Social History   Tobacco Use   Smoking status: Some Days    Current packs/day: 0.25    Average packs/day: 0.3 packs/day for 55.0 years (13.8 ttl pk-yrs)    Types: Cigarettes   Smokeless tobacco: Never  Vaping Use   Vaping status: Never Used  Substance Use Topics   Alcohol use: No    Alcohol/week: 0.0 standard drinks of alcohol   Drug use: No    Family History Family  History  Problem Relation Age of Onset   Hypertension Mother    Alzheimer's disease Mother    Stroke Father        x 3   Heart attack Father    Congestive Heart Failure Father    Diabetes Other        mat great aunts   Colon cancer Neg Hx    Rectal cancer Neg Hx    Esophageal cancer Neg Hx     Review of Systems  Genitourinary:  Negative for dysuria, flank pain, frequency, hematuria and urgency.     Objective   Vital signs in last 24 hours: BP 129/73 (BP Location: Right Arm)   Pulse 98   Temp 98.4 F (36.9 C) (Oral)   Resp 18   Ht 6\' 1"  (1.854 m)   Wt 102 kg   SpO2 97%   BMI 29.67 kg/m   Physical Exam General: A&O, resting, appropriate HEENT: [place]/AT Pulmonary: Normal work of breathing Cardiovascular: no cyanosis Abdomen: Soft, NTTP, nondistended GU: condom catheter in place draining light red urine.    Most Recent Labs: Lab Results  Component Value Date   WBC 8.6 09/18/2023   HGB 7.7 (L) 09/18/2023   HCT 27.0 (L) 09/18/2023   PLT 250 09/18/2023    Lab Results  Component Value Date   NA 134 (L) 09/18/2023   K 3.6 09/18/2023   CL 101 09/18/2023   CO2 24 09/18/2023   BUN 76 (H) 09/18/2023   CREATININE 4.16 (H) 09/18/2023   CALCIUM 7.9 (L) 09/18/2023   MG 1.8 04/06/2022   PHOS 6.1 (H) 09/18/2023    Lab Results  Component Value Date   INR 1.1 12/30/2020   APTT 47 (H) 09/13/2023     Urine Culture: @[id number] (laburin,org,[id number])@   IMAGING: DG Chest Port 1 View Result Date: 09/18/2023 CLINICAL DATA:  Cough. EXAM: PORTABLE CHEST 1 VIEW COMPARISON:  Chest radiograph dated 09/06/2023. FINDINGS: Cardiomegaly with findings of CHF and bilateral pleural effusions  similar or slightly progressed. No pneumothorax. No acute osseous pathology. IMPRESSION: Cardiomegaly with CHF and bilateral pleural effusions. Electronically Signed   By: Elgie Collard M.D.   On: 09/18/2023 13:08    ------  Elmon Kirschner, NP Pager: [phone]   Please  contact the urology consult pager with any further questions/concerns.

## 2023-09-18 NOTE — Progress Notes (Addendum)
 PROGRESS NOTE  DELDRICK LINCH  NWG:[id number] DOB: 1950/12/31 DOA: 09/01/2023 PCP: Kennith Center, NP  Consultants  Brief Narrative: 73 year old with past medical history significant for hypertension, Heart failure preserved ejection fraction, A-fib, CAD, COPD with chronic hypoxic respiratory failure on 2 L of oxygen, chronic venous stasis, PVD admitted 3/17 for worsening cellulitis failing outpatient antibiotics, after injury at nursing home previous week.    Hospital course complicated by AKI on CKD, pulmonary edema, hypotension required IV albumin and discontinuation of atenolol.  Due to concern for peripheral vascular disease and lower extremity wounds he underwent arteriogram by Dr. Myra Gianotti on 3/25, which showed no significant aortoiliac occlusive disease or femoropopliteal disease, anterior tibial dominant vessel perfusing to the ankle and forefoot.  Should have adequate blood flow for wound healing.  Elevated creatinine is main thing keeping him in the hospital.       Assessment & Plan:  AKI, on ? CKD -Creatinine unfortunately continues to rise, 4.6 today.  With blood in catheter tubing, bag, condom cath. See below -Phos also remains elevated.   -IV fluids started 3/31.  With unchanged creatinine since that time.  Stop these as he can orally hydrate and he has already had issues with fluid overload. -Breathing a little bit more today than yesterday.  He did just come back to the bed after walking in the halls about 15 mins ago, but with the ongoing IV fluids checking chest x-ray today. - No clear indications for urgent dialysis.  Does report he did have some nausea yesterday evening which was new for the first time but none this morning.  Did have a BM yesterday.  Sodium 134 this morning.  Potassium good.  Mental status normal. -Legs are unchanged from yesterday.  He has chronic lower extremity wounds plus nonpitting edema. -Follow renal panel. -Appreciate nephrology input.  Chest  x-ray and start with fluids as above.  Hematuria: -He has had dark urine in his catheter bag for several days, today is the first day of gross hematuria..   -  Also with blood in condom cath around urethra meatus.  Urine outpt initially improved when we restarted his fluids 3/29 but now has decreased again. - Eliquis restarted 3/26.  Hgb dropped but has been stable for past several days.   -Question for nephrology is whether we need urology consult now that he has frank blood?  Soft tissue infection, LE infection, ulcers.  Chronic venous stasis disease PVD -Continue local care.  Doing well -Status post 10 days Keflex, and 5 days of doxycycline Abx now finished. -Vascular consulted. Underwent arteriogram 3/25, no insignificant PVD, has adequate blood flow for wound healing.  -Redness less intense. Needs to follow up with Dr Lajoyce Corners out patient.  -Exam has been unchanged for the past several days.  Appreciate wound care input.  Chronic rotator cuff tear: - and resultant subluxation of humeral head - no associated injury, noted during PT.   - XR revealed the same - touched base with Ortho.  Recommended sling for comfort but only if he is in pain or up moving around. - he has been completely pain-free (regarding his shoulder) this hospitalization.  Sling only if needed, otherwise allow him to use.   - Follow up with Ortho outpatient     Hypokalemia:  Resolved. Follow in light of worsening SCr.    Hypertension://Hypotension.  His home atenolol has been held for several days secondary to hypotension and that the week last week.  Lasix also held  by the same reason.  Received IV albumin. -Blood pressure remains good but the tachycardia persists.  See below.   A-fib: Restarted eliquis 3/26 after on heparin for procedure 3/25.   Atenolol discontinue by nephrology due to hypotension.  -Heart rate irregular today and changes from high 90s to low 100s.   - starting low dose metoprolol today.   Follow HR   COPD Chronic hypoxic respiratory failure on 2 L of oxygen CXR today.  It has also been sometime since he had a nebulized breathing treatment, I will put in for an order now   Anxiety depression: Continue Effexor and scheduled Ativan which is a home med for him.  Ultimately should probably not be on benzo long-term scheduled, but his mental status has been great while I've been with him. Defer benzo taper to outpt.  DC if mental status changes.     Anemia; iron deficiency:  Received  IV iron 3/23.   Hemoglobin hovering around 7.  He is already status post 1 unit packed red blood cells this admission. -Following, defer to renal repeat IV iron in light of his ongoing IVF (stopped today) and concern for fluid overload.         Pressure Injury 09/02/23 Heel Left Unstageable - Full thickness tissue loss in which the base of the injury is covered by slough (yellow, tan, gray, green or brown) and/or eschar (tan, brown or black) in the wound bed. (Active)  09/02/23 1713  Location: Heel  Location Orientation: Left  Staging: Unstageable - Full thickness tissue loss in which the base of the injury is covered by slough (yellow, tan, gray, green or brown) and/or eschar (tan, brown or black) in the wound bed.  Wound Description (Comments):   Present on Admission: Yes  Dressing Type Foam - Lift dressing to assess site every shift 09/18/23 0745   DVT prophylaxis:  apixaban (ELIQUIS) tablet 5 mg  Code Status:   Code Status: Full Code Level of care: Med-Surg Status is: Inpatient Dispo: Pending improvement in creatinine.  Eventually, to SNF before ALF when medically ready   Consults called: Nephrology   Subjective: Patient awake and alert today.  Is breathing a little harder today than he has been on my prior exams.  He did just come back to the bed about 15 minutes ago after walking in the hallway.  No complaints.  Denies any shoulder pain  Objective: Vitals:   09/17/23 2035  09/18/23 0545 09/18/23 0626 09/18/23 0810  BP: 129/75 (!) 145/85    Pulse:   (!) 110   Resp: 16 17 20    Temp: 98 F (36.7 C) 97.6 F (36.4 C)    TempSrc: Oral Oral    SpO2: 95% 94%  94%  Weight:      Height:        Intake/Output Summary (Last 24 hours) at 09/18/2023 0840 Last data filed at 09/18/2023 0600 Gross per 24 hour  Intake 5064.9 ml  Output 1575 ml  Net 3489.9 ml   Filed Weights   09/15/23 0500 09/16/23 0500 09/17/23 0500  Weight: 99.2 kg 101.2 kg 102.1 kg   Body mass index is 29.7 kg/m.  Gen: 73 y.o. male in no apparent distress.  Nontoxic Pulm: Slight increase in work of breathing.  I do not hear any crackles but still with decreased air movement bilateral bases CV: Tachycardic and irregular GI: Abdomen soft, non-tender, non-distended, with normoactive bowel sounds Extremities: BL LE ulceration, toe with ulceration LE with less intense  redness. Hyperpigmentation BL changes consistent with chronic venous insufficiency.  Slowly healing.   GU: Condom cath in place with blood in condom as well as catheter tubing and catheter bag.   Neuro: Alert and oriented. No focal neurological deficits. Psych: Calm  Judgement and insight appear normal. Mood & affect appropriate.     I have personally reviewed the following labs and images: CBC: Recent Labs  Lab 09/14/23 0450 09/15/23 0413 09/16/23 1100 09/17/23 0529 09/18/23 0424  WBC 9.0 10.1 7.8 8.8 8.6  HGB 7.6* 7.7* 7.0* 7.2* 7.7*  HCT 26.3* 26.7* 25.2* 25.7* 27.0*  MCV 79.2* 80.2 81.6 82.4 82.6  PLT 349 372 301 281 250   BMP &GFR Recent Labs  Lab 09/14/23 0450 09/15/23 0415 09/16/23 1100 09/17/23 0529 09/17/23 0530 09/18/23 0424  NA 135 135 134* 135 136 134*  K 3.7 3.7 3.5 3.6 3.8 3.6  CL 98 96* 100 100 100 101  CO2 29 27 24 27 27 24   GLUCOSE 100* 88 108* 90 91 91  BUN 62* 66* 69* 71* 73* 76*  CREATININE 2.19* 2.66* 3.35* 3.63* 3.53* 4.16*  CALCIUM 8.3* 8.3* 7.7* 7.9* 7.9* 7.9*  PHOS 3.9 4.8* 5.0*  --   5.4* 6.1*   Estimated Creatinine Clearance: 19.9 mL/min (A) (by C-G formula based on SCr of 4.16 mg/dL (H)). Liver & Pancreas: Recent Labs  Lab 09/14/23 0450 09/15/23 0415 09/16/23 1100 09/17/23 0530 09/18/23 0424  ALBUMIN 2.3* 2.3* 2.1* 2.1* 2.3*   No results for input(s): "LIPASE", "AMYLASE" in the last 168 hours. No results for input(s): "AMMONIA" in the last 168 hours. Diabetic: No results for input(s): "HGBA1C" in the last 72 hours. Recent Labs  Lab 09/12/23 2026  GLUCAP 105*   Cardiac Enzymes: No results for input(s): "CKTOTAL", "CKMB", "CKMBINDEX", "TROPONINI" in the last 168 hours. No results for input(s): "PROBNP" in the last 8760 hours. Coagulation Profile: No results for input(s): "INR", "PROTIME" in the last 168 hours. Thyroid Function Tests: No results for input(s): "TSH", "T4TOTAL", "FREET4", "T3FREE", "THYROIDAB" in the last 72 hours. Lipid Profile: No results for input(s): "CHOL", "HDL", "LDLCALC", "TRIG", "CHOLHDL", "LDLDIRECT" in the last 72 hours.  Anemia Panel: No results for input(s): "VITAMINB12", "FOLATE", "FERRITIN", "TIBC", "IRON", "RETICCTPCT" in the last 72 hours. Urine analysis:    Component Value Date/Time   COLORURINE AMBER (A) 09/14/2023 1349   APPEARANCEUR CLOUDY (A) 09/14/2023 1349   LABSPEC 1.013 09/14/2023 1349   PHURINE 6.0 09/14/2023 1349   GLUCOSEU NEGATIVE 09/14/2023 1349   HGBUR MODERATE (A) 09/14/2023 1349   BILIRUBINUR NEGATIVE 09/14/2023 1349   KETONESUR NEGATIVE 09/14/2023 1349   PROTEINUR 100 (A) 09/14/2023 1349   NITRITE NEGATIVE 09/14/2023 1349   LEUKOCYTESUR TRACE (A) 09/14/2023 1349   Sepsis Labs: Invalid input(s): "PROCALCITONIN", "LACTICIDVEN"  Microbiology: No results found for this or any previous visit (from the past 240 hours).  Radiology Studies: No results found.   Scheduled Meds:  apixaban  5 mg Oral BID   atorvastatin  80 mg Oral QODAY   LORazepam  1 mg Oral BID   metoprolol tartrate  12.5 mg  Oral BID   mometasone-formoterol  2 puff Inhalation BID   multivitamin with minerals  1 tablet Oral Q breakfast   pantoprazole  40 mg Oral Daily   sodium chloride flush  3 mL Intravenous Q12H   venlafaxine XR  75 mg Oral Q breakfast   Continuous Infusions:      LOS: 17 days   35 minutes with more than  50% spent in reviewing records, counseling patient/family and coordinating care.  Tobey Grim, MD [facility] [email or web address] 09/18/2023, 8:40 AM

## 2023-09-18 NOTE — Progress Notes (Signed)
 Mobility Specialist - Progress Note   09/18/23 0831  Mobility  Activity Dangled on edge of bed  Level of Assistance Moderate assist, patient does 50-74%  Range of Motion/Exercises Active Assistive  Activity Response Tolerated fair  Mobility visit 1 Mobility  Mobility Specialist Start Time (ACUTE ONLY) 0815  Mobility Specialist Stop Time (ACUTE ONLY) 0831  Mobility Specialist Time Calculation (min) (ACUTE ONLY) 16 min   Pt was found in bed and agreeable to mobilize. Pt able to sit EOB for ~33min. Stated feeling SOB and too weak to sit up any longer. Encouraged pursed lip breathing and assisted pt to supine. Was left in bed with all needs met. Call bell in reach.  Billey Chang Mobility Specialist

## 2023-09-18 NOTE — Progress Notes (Signed)
 Patient ID: Jordan [name], male   DOB: 06-17-1951, 73 y.o.   MRN: [id number] S: No new complaints this morning. O:BP 129/73 (BP Location: Right Arm)   Pulse 98   Temp 98.4 F (36.9 C) (Oral)   Resp 18   Ht 6\' 1"  (1.854 m)   Wt 102 kg   SpO2 97%   BMI 29.67 kg/m   Gen: NAD CVS: tachy Resp: CTA Abd: +BS, soft, NT/ND Ext: minimal LE edema, bandages on BLLE  Assessment/ Plan: AKI: b/l creat is 1.3, from PCP's office in Jan 2024. Assuming this is AKI but could also have CKD. UA shows some rbc's/ wbc's, no proteinuria. Renal US w/o obstruction. BP's were soft in 90s on presentation and then improved after IV abx and IVF's, but pt developed pulm edema on CXR #2 w/ bilat LE edema. Suspect CKD and/or ATN / vol overload as cause of renal failure. Started IV lasix 3/19, titrated up the dose. He dropped his Bp on the afternoon of 3/23 so Lasix was decreased to 80 mg IV tid, then stopped 09/11/23.  UOP dropped but Scr stable.  Then we restarted po lasix yesteday with increased UOP but unfortunately the Scr rose up to 3.6 yest and 4.1 today. He did have a renal CO2 angiogram which was negative for renal artery stenosis. IVF"s stopped due to his hx of vol overload. Will get noncon chest CT to futher evaluate fluid volume status. No indication for HD yet. Have d/w pt.  Soft tissue infection: SP IV abx, is now on po keflex Left leg ulcer - normal left leg angiogram Anemia - hgb down to 6.7.  Received blood transfusion this wknd.  COPD Atrial fib S/p TAVR Chronic resp failure - on home O2 2 L HTN - holding home meds (BB) Disposition - will need SNF prior to going back to assisted living at [facility].  Vinson Moselle  MD  [facility] 09/18/2023, 3:25 PM  Recent Labs  Lab 09/17/23 0529 09/17/23 0530 09/18/23 0424  HGB 7.2*  --  7.7*  ALBUMIN  --  2.1* 2.3*  CALCIUM 7.9* 7.9* 7.9*  PHOS  --  5.4* 6.1*  CREATININE 3.63* 3.53* 4.16*  K 3.6 3.8 3.6    Inpatient medications:  apixaban  5 mg Oral BID    atorvastatin  80 mg Oral QODAY   LORazepam  1 mg Oral BID   metoprolol tartrate  12.5 mg Oral BID   mometasone-formoterol  2 puff Inhalation BID   multivitamin with minerals  1 tablet Oral Q breakfast   pantoprazole  40 mg Oral Daily   sevelamer carbonate  800 mg Oral TID WC   sodium chloride flush  3 mL Intravenous Q12H   venlafaxine XR  75 mg Oral Q breakfast    acetaminophen **OR** acetaminophen, acetaminophen, albuterol, alum & mag hydroxide-simeth, camphor-menthol, hydrALAZINE, melatonin, ondansetron (ZOFRAN) IV, oxyCODONE, polyethylene glycol, sodium chloride flush, traMADol

## 2023-09-18 NOTE — Plan of Care (Signed)
   Problem: Education: Goal: Knowledge of General Education information will improve Description Including pain rating scale, medication(s)/side effects and non-pharmacologic comfort measures Outcome: Progressing   Problem: Health Behavior/Discharge Planning: Goal: Ability to manage health-related needs will improve Outcome: Progressing

## 2023-09-19 ENCOUNTER — Inpatient Hospital Stay (HOSPITAL_COMMUNITY)

## 2023-09-19 DIAGNOSIS — J90 Pleural effusion, not elsewhere classified: Secondary | ICD-10-CM

## 2023-09-19 DIAGNOSIS — N179 Acute kidney failure, unspecified: Secondary | ICD-10-CM | POA: Diagnosis not present

## 2023-09-19 DIAGNOSIS — I5032 Chronic diastolic (congestive) heart failure: Secondary | ICD-10-CM

## 2023-09-19 DIAGNOSIS — J9611 Chronic respiratory failure with hypoxia: Secondary | ICD-10-CM

## 2023-09-19 DIAGNOSIS — I959 Hypotension, unspecified: Secondary | ICD-10-CM

## 2023-09-19 DIAGNOSIS — L03116 Cellulitis of left lower limb: Secondary | ICD-10-CM | POA: Diagnosis not present

## 2023-09-19 DIAGNOSIS — I872 Venous insufficiency (chronic) (peripheral): Secondary | ICD-10-CM

## 2023-09-19 DIAGNOSIS — R310 Gross hematuria: Secondary | ICD-10-CM

## 2023-09-19 LAB — CBC
HCT: 25.7 % — ABNORMAL LOW (ref 39.0–52.0)
Hemoglobin: 7.2 g/dL — ABNORMAL LOW (ref 13.0–17.0)
MCH: 23.4 pg — ABNORMAL LOW (ref 26.0–34.0)
MCHC: 28 g/dL — ABNORMAL LOW (ref 30.0–36.0)
MCV: 83.4 fL (ref 80.0–100.0)
Platelets: 228 10*3/uL (ref 150–400)
RBC: 3.08 MIL/uL — ABNORMAL LOW (ref 4.22–5.81)
RDW: 25.2 % — ABNORMAL HIGH (ref 11.5–15.5)
WBC: 7.4 10*3/uL (ref 4.0–10.5)
nRBC: 0 % (ref 0.0–0.2)

## 2023-09-19 LAB — RENAL FUNCTION PANEL
Albumin: 2.2 g/dL — ABNORMAL LOW (ref 3.5–5.0)
Anion gap: 11 (ref 5–15)
BUN: 87 mg/dL — ABNORMAL HIGH (ref 8–23)
CO2: 23 mmol/L (ref 22–32)
Calcium: 7.8 mg/dL — ABNORMAL LOW (ref 8.9–10.3)
Chloride: 100 mmol/L (ref 98–111)
Creatinine, Ser: 4.97 mg/dL — ABNORMAL HIGH (ref 0.61–1.24)
GFR, Estimated: 12 mL/min — ABNORMAL LOW (ref >60)
Glucose, Bld: 85 mg/dL (ref 70–99)
Phosphorus: 7.3 mg/dL — ABNORMAL HIGH (ref 2.5–4.6)
Potassium: 4.1 mmol/L (ref 3.5–5.1)
Sodium: 134 mmol/L — ABNORMAL LOW (ref 135–145)

## 2023-09-19 MED ORDER — SODIUM CHLORIDE 0.9 % IV SOLN
INTRAVENOUS | Status: DC
Start: 2023-09-19 — End: 2023-09-20

## 2023-09-19 MED ORDER — SODIUM CHLORIDE 0.9 % IV SOLN
2.0000 g | INTRAVENOUS | Status: AC
  Administered 2023-09-19 – 2023-09-21 (×3): 2 g via INTRAVENOUS
  Filled 2023-09-19 (×2): qty 20

## 2023-09-19 NOTE — Hospital Course (Signed)
 HPI: Jordan [name] is an 73 y.o. male with HTN, HFpEF, A-fib, CAD, COPD with chronic hypoxic respiratory failure on 2 L O2 via [place] was brought in from his SNF for worsening of left leg wound.  Patient has known chronic venous stasis disease.  Patient tells me that he was doing well until he got a new wheelchair last week and the nurses aide tried to put him in bed by "lifting me over the hand rails of the bed with me still in my wheelchair".  Patient states that one of the nurses aide lost her balance and he scraped his leg and his skin came off.  They were supposed to wash it and keep it wrapped but they did not do that.  He was started on oral antibiotics however over the last couple of days he has noted increasing redness and pain above where the skin came off.   ED Course:  The patient was noted to have increased erythema above an area of denuded skin.  WBC was 14, creatinine was 2.4, blood pressure was 104/60, heart rate was 115.  He was started on vancomycin and cefepime and brought in for admission.  Significant Events: Admitted 09/01/2023 for left leg cellulitis   Significant Labs: WBC 14.2, HgB 7.8, plt 265 Na 136, K 3.2, CO2 24 Bun 42, scr 2.37, glu 90  Significant Imaging Studies: 09-01-2023 CXR Poor inspiration. Atelectasis and or pneumonia in both lung bases, right worse than left. Possible small amount of pleural fluid on the right. 09-01-2023 Left Tib/fib XR No acute or traumatic finding. Osteoarthritis of the knee joint. Distant ORIF of medial malleolar tibial fracture. Skin and vascular calcification. 09-01-2023 Left foot XR Soft tissue swelling of the second toe. Deformity of the distal aspect of the proximal phalanx that could be post traumatic or infectious. 2. Bone loss at the tip of the distal phalanx of the great toe, but this is chronic since the study of 2022 and not apparently progressive. 3. Pes planus and chronic degenerative changes in the midfoot. 09-01-2023 LE U/S  Negative for DVT  09-05-2023 ABI Resting right ankle-brachial index.  Unable to obtain LEFT ABI due to patient pain tolerance and movement.  Monophasic waveforms are detected in the dorsalis pedis and posterior  tibial arteries  09-06-2023 Renal U/S No hydronephrosis or shadowing stone.  09-06-2023 CXR Mild central pulmonary vascular congestion with probable bilateral pulmonary edema and bibasilar atelectasis and pleural effusions. 09-12-2023 ECHO LVEF 60%. TAVR 26 mm sapien valve 09-12-2023 Aortogram  Aortogram: No evidence of renal artery stenosis.  Due to bowel gas, the abdominal aorta was not well-evaluated.  There did not appear to be any significant stenosis.  Bilateral common and external iliac arteries were widely patent.            Right Lower Extremity: Not evaluated.             Left Lower Extremity: The left common femoral, profundofemoral, and superficial femoral artery are widely patent.  The popliteal artery is widely patent.  The patient has three-vessel runoff.  The dominant vessel across the ankle is the anterior tibial.  There is mild stenosis in the mid posterior tibial artery, however this does not have significant perfusion of the forefoot. 09-15-2023 Superior subluxation of the humeral head abutting the undersurface of the acromion,  consistent with chronic rotator cuff tear. 2. Mild acromioclavicular degenerative change 09-18-2023 CXR Cardiomegaly with CHF and bilateral pleural effusions.   Antibiotic Therapy: Anti-infectives (From admission, onward)  Start     Dose/Rate Route Frequency Ordered Stop   09/08/23 1545  cephALEXin (KEFLEX) capsule 500 mg        500 mg Oral Every 8 hours 09/08/23 1447 09/11/23 0545   09/08/23 1545  doxycycline (VIBRA-TABS) tablet 100 mg        100 mg Oral Every 12 hours 09/08/23 1447 09/12/23 2013   09/04/23 1445  cephALEXin (KEFLEX) capsule 500 mg        500 mg Oral Every 8 hours 09/04/23 1353 09/08/23 0509   09/02/23 1400  vancomycin  (VANCOREADY) IVPB 1750 mg/350 mL  Status:  Discontinued        1,750 mg 175 mL/hr over 120 Minutes Intravenous Every 48 hours 09/01/23 1729 09/04/23 1353   09/01/23 1500  vancomycin (VANCOCIN) IVPB 1000 mg/200 mL premix  Status:  Discontinued        1,000 mg 200 mL/hr over 60 Minutes Intravenous  Once 09/01/23 1458 09/01/23 1504   09/01/23 1230  vancomycin (VANCOCIN) IVPB 1000 mg/200 mL premix        1,000 mg 200 mL/hr over 60 Minutes Intravenous  Once 09/01/23 1224 09/01/23 1418   09/01/23 1230  ceFEPIme (MAXIPIME) 2 g in sodium chloride 0.9 % 100 mL IVPB        2 g 200 mL/hr over 30 Minutes Intravenous  Once 09/01/23 1224 09/01/23 1343       Procedures: 09-12-2023 Abdominal Aortogram  Consultants: Vascular surgery Nephrology urology

## 2023-09-19 NOTE — Assessment & Plan Note (Signed)
 09-01-2023 through 09-18-2023  Continue local care.  Doing well -Status post 10 days Keflex, and 5 days of doxycycline Abx now finished. -Vascular consulted. Underwent arteriogram 3/25, no insignificant PVD, has adequate blood flow for wound healing.  -Redness less intense. Needs to follow up with Dr Lajoyce Corners out patient.  -Exam has been unchanged for the past several days.  Appreciate wound care input.  09-19-2023 resolved.

## 2023-09-19 NOTE — Progress Notes (Signed)
   CT chest shows bilateral pleural effusions.  Carollee Herter, DO [facility]

## 2023-09-19 NOTE — Progress Notes (Signed)
 PROGRESS NOTE    Jordan [name]  JXB:[id number] DOB: [birth year] DOA: 09/01/2023 PCP: Kennith Center, NP  Subjective: Pt seen and examined. Initially admitted on 09-01-2023 due to left leg cellulitis after a injury to his left leg at SNF.  Over the last 18 days, his cellulitis has improved. Was seen by vascular surgery and had angiogram. Has had progressive decline in renal function despite IVF, IV diuretics. Has been on IVF and having worsening Scr.  Seen by urology yesterday due to gross hematuria. Eliquis stopped today. Last dose of Eliquis on @ 2016 on 09-18-2023.  Discussed with Dr. Bettina Gavia today with nephrology. Given worsening Scr despite IVF and worsening anasarca, will transfer to [facility] to consider HD initiation.  Pt aware.   Hospital Course: HPI: Jordan [name] is an 73 y.o. male with HTN, HFpEF, A-fib, CAD, COPD with chronic hypoxic respiratory failure on 2 L O2 via [place] was brought in from his SNF for worsening of left leg wound.  Patient has known chronic venous stasis disease.  Patient tells me that he was doing well until he got a new wheelchair last week and the nurses aide tried to put him in bed by "lifting me over the hand rails of the bed with me still in my wheelchair".  Patient states that one of the nurses aide lost her balance and he scraped his leg and his skin came off.  They were supposed to wash it and keep it wrapped but they did not do that.  He was started on oral antibiotics however over the last couple of days he has noted increasing redness and pain above where the skin came off.   ED Course:  The patient was noted to have increased erythema above an area of denuded skin.  WBC was 14, creatinine was 2.4, blood pressure was 104/60, heart rate was 115.  He was started on vancomycin and cefepime and brought in for admission.  Significant Events: Admitted 09/01/2023 for left leg cellulitis   Significant Labs: WBC 14.2, HgB 7.8, plt 265 Na 136, K 3.2, CO2 24 Bun  42, scr 2.37, glu 90  Significant Imaging Studies: 09-01-2023 CXR Poor inspiration. Atelectasis and or pneumonia in both lung bases, right worse than left. Possible small amount of pleural fluid on the right. 09-01-2023 Left Tib/fib XR No acute or traumatic finding. Osteoarthritis of the knee joint. Distant ORIF of medial malleolar tibial fracture. Skin and vascular calcification. 09-01-2023 Left foot XR Soft tissue swelling of the second toe. Deformity of the distal aspect of the proximal phalanx that could be post traumatic or infectious. 2. Bone loss at the tip of the distal phalanx of the great toe, but this is chronic since the study of 2022 and not apparently progressive. 3. Pes planus and chronic degenerative changes in the midfoot. 09-01-2023 LE U/S Negative for DVT  09-05-2023 ABI Resting right ankle-brachial index.  Unable to obtain LEFT ABI due to patient pain tolerance and movement.  Monophasic waveforms are detected in the dorsalis pedis and posterior  tibial arteries  09-06-2023 Renal U/S No hydronephrosis or shadowing stone.  09-06-2023 CXR Mild central pulmonary vascular congestion with probable bilateral pulmonary edema and bibasilar atelectasis and pleural effusions. 09-12-2023 ECHO LVEF 60%. TAVR 26 mm sapien valve 09-12-2023 Aortogram  Aortogram: No evidence of renal artery stenosis.  Due to bowel gas, the abdominal aorta was not well-evaluated.  There did not appear to be any significant stenosis.  Bilateral common and external iliac arteries were  widely patent.            Right Lower Extremity: Not evaluated.             Left Lower Extremity: The left common femoral, profundofemoral, and superficial femoral artery are widely patent.  The popliteal artery is widely patent.  The patient has three-vessel runoff.  The dominant vessel across the ankle is the anterior tibial.  There is mild stenosis in the mid posterior tibial artery, however this does not have significant perfusion of the  forefoot. 09-15-2023 Superior subluxation of the humeral head abutting the undersurface of the acromion,  consistent with chronic rotator cuff tear. 2. Mild acromioclavicular degenerative change 09-18-2023 CXR Cardiomegaly with CHF and bilateral pleural effusions.   Antibiotic Therapy: Anti-infectives (From admission, onward)    Start     Dose/Rate Route Frequency Ordered Stop   09/08/23 1545  cephALEXin (KEFLEX) capsule 500 mg        500 mg Oral Every 8 hours 09/08/23 1447 09/11/23 0545   09/08/23 1545  doxycycline (VIBRA-TABS) tablet 100 mg        100 mg Oral Every 12 hours 09/08/23 1447 09/12/23 2013   09/04/23 1445  cephALEXin (KEFLEX) capsule 500 mg        500 mg Oral Every 8 hours 09/04/23 1353 09/08/23 0509   09/02/23 1400  vancomycin (VANCOREADY) IVPB 1750 mg/350 mL  Status:  Discontinued        1,750 mg 175 mL/hr over 120 Minutes Intravenous Every 48 hours 09/01/23 1729 09/04/23 1353   09/01/23 1500  vancomycin (VANCOCIN) IVPB 1000 mg/200 mL premix  Status:  Discontinued        1,000 mg 200 mL/hr over 60 Minutes Intravenous  Once 09/01/23 1458 09/01/23 1504   09/01/23 1230  vancomycin (VANCOCIN) IVPB 1000 mg/200 mL premix        1,000 mg 200 mL/hr over 60 Minutes Intravenous  Once 09/01/23 1224 09/01/23 1418   09/01/23 1230  ceFEPIme (MAXIPIME) 2 g in sodium chloride 0.9 % 100 mL IVPB        2 g 200 mL/hr over 30 Minutes Intravenous  Once 09/01/23 1224 09/01/23 1343       Procedures: 09-12-2023 Abdominal Aortogram  Consultants: Vascular surgery Nephrology urology    Assessment and Plan: * Left leg cellulitis 09-01-2023 through 09-18-2023  Continue local care.  Doing well -Status post 10 days Keflex, and 5 days of doxycycline Abx now finished. -Vascular consulted. Underwent arteriogram 3/25, no insignificant PVD, has adequate blood flow for wound healing.  -Redness less intense. Needs to follow up with Dr Lajoyce Corners out patient.  -Exam has been unchanged for the past  several days.  Appreciate wound care input.  09-19-2023 resolved.  Gross hematuria Prior to 09-19-2023 -He has had dark urine in his catheter bag for several days, today is the first day of gross hematuria..   -  Also with blood in condom cath around urethra meatus.  Urine outpt initially improved when we restarted his fluids 3/29 but now has decreased again. - Eliquis restarted 3/26.  Hgb dropped but has been stable for past several days.   -Question for nephrology is whether we need urology consult now that he has frank blood?  09-19-2023 seen by urology. They requested to hold systemic anticoagulation. Will hold eliquis for now. Pt still with grossly bloody urine in condom catheter bag. Risk of stroke while off systemic anticoagulation but given his gross hematuria not much choice here.  Acute renal  failure (ARF) (HCC) 09-01-2023 through 09-18-2023 -Creatinine unfortunately continues to rise, 4.6 today.  With blood in catheter tubing, bag, condom cath. See below -Phos also remains elevated.   -IV fluids started 3/31.  With unchanged creatinine since that time.  Stop these as he can orally hydrate and he has already had issues with fluid overload. -Breathing a little bit more today than yesterday.  He did just come back to the bed after walking in the halls about 15 mins ago, but with the ongoing IV fluids checking chest x-ray today. - No clear indications for urgent dialysis.  Does report he did have some nausea yesterday evening which was new for the first time but none this morning.  Did have a BM yesterday.  Sodium 134 this morning.  Potassium good.  Mental status normal. -Legs are unchanged from yesterday.  He has chronic lower extremity wounds plus nonpitting edema. -Follow renal panel. -Appreciate nephrology input.  Chest x-ray and start with fluids as above.  09-19-2023 I called [facility](PCP office) and obtained his past labs.  Discussed with Dr. Bettina Gavia. Pt has been on  IVF. Only 600 ml urine output.Scr up to 4.97/BUN 87 today. on IVF @ 85 ml.hr. yesterday Scr 4.16, BUN 76. Will transfer to [facility] to consider initiation of HD.  06-2022 BUN 18, Scr 1.3, TP 6.5, alb 3.9, Na 138 11-2021 BUN 22, Scr 1.2, TP 6.3, alb 3.7, t. Bili 1.7, Na 134   Pleural effusion on right 09-19-2023 seen on CXR from yesterday. Pt remains on 2 L/min. May need thoracentesis.  Hypotension Prior to 09-19-2023 His home atenolol has been held for several days secondary to hypotension and that the week last week.  Lasix also held by the same reason.  Received IV albumin. -Blood pressure remains good but the tachycardia persists.  09-19-2023 remains on IVF at 85 ml/hr. Scr worsening. Transfer to [facility] for nephrology to consider HD initiation.  Chronic respiratory failure with hypoxia (HCC) Chronically on 2 L/min  Persistent atrial fibrillation (HCC) Prior to 09-19-2023 Restarted eliquis 3/26 after on heparin for procedure 3/25.   Atenolol discontinue by nephrology due to hypotension.  -Heart rate irregular today and changes from high 90s to low 100s.   - starting low dose metoprolol today.  Follow HR  09-19-2023 on low dose lopressor to help control HR.  Chronic heart failure with preserved ejection fraction (HCC) 09-19-2023 pt has large right pleural effusion on CXR from yesterday. Consider right thoracentesis. Unable to diurese due worsening Scr.  S/P TAVR (transcatheter aortic valve replacement) Stable.  COPD (chronic obstructive pulmonary disease) (HCC) 09-19-2023 no wheezing. Not currently exacerbated.  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 Continue with wound care.  HLD (hyperlipidemia) Chronic. On lipitor 80 mg every other day.  Essential hypertension 09-19-2023 off HTN meds due to hypotension   DVT prophylaxis: Place and maintain sequential compression device Start: 09/19/23 1109    Code Status: Full Code Family Communication: no family at  bedside Disposition Plan: SNF Reason for continuing need for hospitalization: transfer to [facility] for nephrology to consider initiation of HD.  Objective: Vitals:   09/18/23 1952 09/19/23 0500 09/19/23 0617 09/19/23 1002  BP: 116/77  121/74   Pulse: (!) 108  98   Resp: 20  19   Temp: (!) 97.3 F (36.3 C)  97.6 F (36.4 C)   TempSrc: Oral  Oral   SpO2: 97%  92% 92%  Weight:  103.5 kg    Height:  Intake/Output Summary (Last 24 hours) at 09/19/2023 1108 Last data filed at 09/19/2023 1007 Gross per 24 hour  Intake 540 ml  Output 700 ml  Net -160 ml   Filed Weights   09/17/23 0500 09/18/23 0716 09/19/23 0500  Weight: 102.1 kg 102 kg 103.5 kg    Examination:  Physical Exam Vitals and nursing note reviewed.  Constitutional:      General: He is not in acute distress.    Appearance: He is obese. He is not toxic-appearing.     Comments: Appears chronically ill  HENT:     Head: Normocephalic and atraumatic.  Eyes:     General: No scleral icterus. Cardiovascular:     Rate and Rhythm: Normal rate. Rhythm irregular.     Pulses: Normal pulses.  Pulmonary:     Effort: Pulmonary effort is normal. No respiratory distress.     Breath sounds: Normal breath sounds.     Comments: No BS in right base Abdominal:     General: Abdomen is protuberant. Bowel sounds are normal. There is no distension.     Palpations: Abdomen is soft.     Tenderness: There is no abdominal tenderness.  Genitourinary:    Comments: +condom catheter. Gross hematuria in tubing and base. Dark blood. No bright red blood Musculoskeletal:     Comments: +2 pitting edema of UE/LE bilaterrally  Skin:    General: Skin is warm and dry.  Neurological:     Mental Status: He is oriented to person, place, and time.     Data Reviewed: I have personally reviewed following labs and imaging studies  CBC: Recent Labs  Lab 09/15/23 0413 09/16/23 1100 09/17/23 0529 09/18/23 0424 09/19/23 0450  WBC 10.1 7.8 8.8 8.6  7.4  HGB 7.7* 7.0* 7.2* 7.7* 7.2*  HCT 26.7* 25.2* 25.7* 27.0* 25.7*  MCV 80.2 81.6 82.4 82.6 83.4  PLT 372 301 281 250 228   Basic Metabolic Panel: Recent Labs  Lab 09/15/23 0415 09/16/23 1100 09/17/23 0529 09/17/23 0530 09/18/23 0424 09/19/23 0450  NA 135 134* 135 136 134* 134*  K 3.7 3.5 3.6 3.8 3.6 4.1  CL 96* 100 100 100 101 100  CO2 27 24 27 27 24 23   GLUCOSE 88 108* 90 91 91 85  BUN 66* 69* 71* 73* 76* 87*  CREATININE 2.66* 3.35* 3.63* 3.53* 4.16* 4.97*  CALCIUM 8.3* 7.7* 7.9* 7.9* 7.9* 7.8*  PHOS 4.8* 5.0*  --  5.4* 6.1* 7.3*   GFR: Estimated Creatinine Clearance: 16.7 mL/min (A) (by C-G formula based on SCr of 4.97 mg/dL (H)). Liver Function Tests: Recent Labs  Lab 09/15/23 0415 09/16/23 1100 09/17/23 0530 09/18/23 0424 09/19/23 0450  ALBUMIN 2.3* 2.1* 2.1* 2.3* 2.2*   CBG: Recent Labs  Lab 09/12/23 2026  GLUCAP 105*   Radiology Studies: [facility] Chest Port 1 View Result Date: 09/18/2023 CLINICAL DATA:  Cough. EXAM: PORTABLE CHEST 1 VIEW COMPARISON:  Chest radiograph dated 09/06/2023. FINDINGS: Cardiomegaly with findings of CHF and bilateral pleural effusions similar or slightly progressed. No pneumothorax. No acute osseous pathology. IMPRESSION: Cardiomegaly with CHF and bilateral pleural effusions. Electronically Signed   By: Elgie Collard M.D.   On: 09/18/2023 13:08    Scheduled Meds:  atorvastatin  80 mg Oral QODAY   LORazepam  1 mg Oral BID   metoprolol tartrate  12.5 mg Oral BID   mometasone-formoterol  2 puff Inhalation BID   multivitamin with minerals  1 tablet Oral Q breakfast   pantoprazole  40  mg Oral Daily   sevelamer carbonate  800 mg Oral TID WC   sodium chloride flush  3 mL Intravenous Q12H   venlafaxine XR  75 mg Oral Q breakfast   Continuous Infusions:  sodium chloride 85 mL/hr at 09/19/23 0813   cefTRIAXone (ROCEPHIN)  IV       LOS: 18 days   Time spent: 55 minutes  Carollee Herter, DO  [facility]  09/19/2023, 11:08 AM

## 2023-09-19 NOTE — Assessment & Plan Note (Signed)
 Prior to 09-19-2023 Restarted eliquis 3/26 after on heparin for procedure 3/25.   Atenolol discontinue by nephrology due to hypotension.  -Heart rate irregular today and changes from high 90s to low 100s.   - starting low dose metoprolol today.  Follow HR  09-19-2023 on low dose lopressor to help control HR.

## 2023-09-19 NOTE — Subjective & Objective (Signed)
 Pt seen and examined. Initially admitted on 09-01-2023 due to left leg cellulitis after a injury to his left leg at SNF.  Over the last 18 days, his cellulitis has improved. Was seen by vascular surgery and had angiogram. Has had progressive decline in renal function despite IVF, IV diuretics. Has been on IVF and having worsening Scr.  Seen by urology yesterday due to gross hematuria. Eliquis stopped today. Last dose of Eliquis on @ 2016 on 09-18-2023.  Discussed with Dr. Bettina Gavia today with nephrology. Given worsening Scr despite IVF and worsening anasarca, will transfer to [facility] to consider HD initiation.  Pt aware.

## 2023-09-19 NOTE — Progress Notes (Signed)
 Patient ID: Jordan [name], male   DOB: 1950-12-17, 73 y.o.   MRN: [id number] S: No new complaints this morning. O:BP 126/83 (BP Location: Left Arm)   Pulse 94   Temp 98.3 F (36.8 C) (Oral)   Resp 18   Ht 6\' 1"  (1.854 m)   Wt 103.5 kg   SpO2 98%   BMI 30.10 kg/m   Gen: NAD CVS: tachy Resp: CTA Abd: +BS, soft, NT/ND Ext: minimal LE edema, bandages on BLLE  Assessment/ Plan: AKI: b/l creat is 1.3, from PCP's office in Jan 2024. Assuming this is AKI but could also have CKD. UA shows some rbc's/ wbc's, no proteinuria. Renal US w/o obstruction. BP's were soft in 90s on presentation and then improved after IV abx and IVF's, but pt developed pulm edema on CXR #2 w/ bilat LE edema. Suspect CKD and/or ATN / vol overload as cause of renal failure. Started IV lasix 3/19, titrated up the dose. He dropped his Bp on the afternoon of 3/23 so Lasix was decreased to 80 mg IV tid, then stopped 09/11/23.  UOP dropped but Scr stable.  Then we restarted po lasix yesteday with increased UOP but unfortunately the Scr rose up to 3.6 yest and 4.1 today. He did have a renal CO2 angiogram which was negative for renal artery stenosis. Creat up again today at 4.9.  Ordering renal US stat due to gross hematuria and potential risk of obstruction from clots as cause for AKI. Resuming IVF"s but will lower to 40 cc/hr. Agree w/ moving to [facility] hospital for HD if needed. F/u labs in am.  Gross hematuria: appreciate urology assistance.  Soft tissue infection: SP IV abx, is now on po keflex Left leg ulcer - normal left leg angiogram Anemia - hgb down to 6.7.  Received blood transfusion this wknd.  COPD Atrial fib S/p TAVR Chronic resp failure - on home O2 2 L HTN - holding home meds (BB) Disposition - came from assisted living at [facility].  Vinson Moselle  MD  [facility] 09/19/2023, 2:40 PM  Recent Labs  Lab 09/18/23 0424 09/19/23 0450  HGB 7.7* 7.2*  ALBUMIN 2.3* 2.2*  CALCIUM 7.9* 7.8*  PHOS 6.1* 7.3*  CREATININE  4.16* 4.97*  K 3.6 4.1    Inpatient medications:  atorvastatin  80 mg Oral QODAY   LORazepam  1 mg Oral BID   metoprolol tartrate  12.5 mg Oral BID   mometasone-formoterol  2 puff Inhalation BID   multivitamin with minerals  1 tablet Oral Q breakfast   pantoprazole  40 mg Oral Daily   sevelamer carbonate  800 mg Oral TID WC   sodium chloride flush  3 mL Intravenous Q12H   venlafaxine XR  75 mg Oral Q breakfast    sodium chloride 85 mL/hr at 09/19/23 0813   cefTRIAXone (ROCEPHIN)  IV 2 g (09/19/23 1326)   acetaminophen **OR** acetaminophen, acetaminophen, albuterol, camphor-menthol, hydrALAZINE, melatonin, ondansetron (ZOFRAN) IV, oxyCODONE, polyethylene glycol, sodium chloride flush, traMADol

## 2023-09-19 NOTE — Assessment & Plan Note (Addendum)
 09-01-2023 through 09-18-2023 -Creatinine unfortunately continues to rise, 4.6 today.  With blood in catheter tubing, bag, condom cath. See below -Phos also remains elevated.   -IV fluids started 3/31.  With unchanged creatinine since that time.  Stop these as he can orally hydrate and he has already had issues with fluid overload. -Breathing a little bit more today than yesterday.  He did just come back to the bed after walking in the halls about 15 mins ago, but with the ongoing IV fluids checking chest x-ray today. - No clear indications for urgent dialysis.  Does report he did have some nausea yesterday evening which was new for the first time but none this morning.  Did have a BM yesterday.  Sodium 134 this morning.  Potassium good.  Mental status normal. -Legs are unchanged from yesterday.  He has chronic lower extremity wounds plus nonpitting edema. -Follow renal panel. -Appreciate nephrology input.  Chest x-ray and start with fluids as above.  09-19-2023 I called [facility](PCP office) and obtained his past labs.  Discussed with Dr. Bettina Gavia. Pt has been on IVF. Only 600 ml urine output.Scr up to 4.97/BUN 87 today. on IVF @ 85 ml.hr. yesterday Scr 4.16, BUN 76. Will transfer to [facility] to consider initiation of HD.  06-2022 BUN 18, Scr 1.3, TP 6.5, alb 3.9, Na 138 11-2021 BUN 22, Scr 1.2, TP 6.3, alb 3.7, t. Bili 1.7, Na 134

## 2023-09-19 NOTE — Plan of Care (Signed)
   Problem: Education: Goal: Knowledge of General Education information will improve Description: Including pain rating scale, medication(s)/side effects and non-pharmacologic comfort measures Outcome: Progressing   Problem: Safety: Goal: Ability to remain free from injury will improve Outcome: Progressing

## 2023-09-19 NOTE — Assessment & Plan Note (Signed)
Chronic. Continue with wound care

## 2023-09-19 NOTE — Assessment & Plan Note (Signed)
 09-19-2023 no wheezing. Not currently exacerbated.

## 2023-09-19 NOTE — Progress Notes (Signed)
 OT Cancellation Note  Patient Details Name: BERLE FITZ MRN: [id number] DOB: June 22, 1950   Cancelled Treatment:    Reason Eval/Treat Not Completed: Patient at procedure or test/ unavailable Attempted to see patient x2 this AM with patient eating breakfast upon first approach. Second approach imaging was transporting patient off hall. OT to continue to follow.  Rosalio Loud, MS Acute Rehabilitation Department Office# [phone]  09/19/2023, 11:47 AM

## 2023-09-19 NOTE — Plan of Care (Signed)
   Problem: Education: Goal: Knowledge of General Education information will improve Description: Including pain rating scale, medication(s)/side effects and non-pharmacologic comfort measures Outcome: Progressing   Problem: Health Behavior/Discharge Planning: Goal: Ability to manage health-related needs will improve Outcome: Progressing   Problem: Nutrition: Goal: Adequate nutrition will be maintained Outcome: Progressing

## 2023-09-19 NOTE — TOC Progression Note (Signed)
 Transition of Care [facility]) - Progression Note    Patient Details  Name: Jordan [name] MRN: [id number] Date of Birth: 02/04/1951  Transition of Care [facility]) CM/SW Contact  Amada Jupiter, LCSW Phone Number: 09/19/2023, 10:53 AM  Clinical Narrative:    Alerted by MD that pt expected to transfer to [facility] today for ongoing medical management.  Have notified admissions at [facility] and will alert [facility] TOC coverage that pt had been accepted to [facility] for SNF as he may still need this at later date.     Expected Discharge Plan: Assisted Living Barriers to Discharge: Continued Medical Work up  Expected Discharge Plan and Services In-house Referral: Clinical Social Work     Living arrangements for the past 2 months: Assisted Living Facility                                       Social Determinants of Health (SDOH) Interventions SDOH Screenings   Food Insecurity: Patient Declined (09/01/2023)  Housing: Patient Declined (09/01/2023)  Transportation Needs: Patient Declined (09/01/2023)  Utilities: Patient Declined (09/01/2023)  Social Connections: Patient Declined (09/01/2023)  Tobacco Use: High Risk (09/01/2023)    Readmission Risk Interventions    09/04/2023    2:49 PM 04/04/2022   11:02 AM  Readmission Risk Prevention Plan  Transportation Screening Complete Complete  PCP or Specialist Appt within 5-7 Days Complete   PCP or Specialist Appt within 3-5 Days  Complete  Home Care Screening Complete   Medication Review (RN CM) Complete   HRI or Home Care Consult  Complete  Social Work Consult for Recovery Care Planning/Counseling  Complete  Palliative Care Screening  Not Applicable  Medication Review [employer])  Complete

## 2023-09-19 NOTE — Assessment & Plan Note (Signed)
 09-19-2023 off HTN meds due to hypotension

## 2023-09-19 NOTE — Assessment & Plan Note (Addendum)
 Prior to 09-19-2023 -He has had dark urine in his catheter bag for several days, today is the first day of gross hematuria..   -  Also with blood in condom cath around urethra meatus.  Urine outpt initially improved when we restarted his fluids 3/29 but now has decreased again. - Eliquis restarted 3/26.  Hgb dropped but has been stable for past several days.   -Question for nephrology is whether we need urology consult now that he has frank blood?  09-19-2023 seen by urology. They requested to hold systemic anticoagulation. Will hold eliquis for now. Pt still with grossly bloody urine in condom catheter bag. Risk of stroke while off systemic anticoagulation but given his gross hematuria not much choice here.

## 2023-09-19 NOTE — Progress Notes (Signed)
   7 Days Post-Op Subjective: NAEON. Pleasant with no specific complaints.   Objective: Vital signs in last 24 hours: Temp:  [97.3 F (36.3 C)-98.4 F (36.9 C)] 97.6 F (36.4 C) (04/01 0617) Pulse Rate:  [98-108] 98 (04/01 0617) Resp:  [18-20] 19 (04/01 0617) BP: (116-129)/(73-77) 121/74 (04/01 0617) SpO2:  [92 %-97 %] 92 % (04/01 1002) FiO2 (%):  [28 %] 28 % (04/01 1002) Weight:  [103.5 kg] 103.5 kg (04/01 0500)  Assessment/Plan: #hematuria Hold Eliquis if medically reasonable. This is preferable prior to surgical consideration.  Pt cannot tolerate CT hematuria d/t kidney injury.  Will need outpt cystoscopy at some point.  His UA was not remarkable on 3/27, though there was rare bacteria. Sample has been disposed of. New UA/culture would be color contaminated. Reasonable to treat him for UTI empirically.  Consider gentle hydration and watchful waiting.  Trend labs. HgB has improved since yesterday.  Failing the above, can consider cystoscopy with fulguration.  No change to plan  Intake/Output from previous day: 03/31 0701 - 04/01 0700 In: 693.3 [P.O.:660; I.V.:33.3] Out: 600 [Urine:600]  Intake/Output this shift: Total I/O In: 120 [P.O.:120] Out: 250 [Urine:250]  Physical Exam:  General: Alert and oriented CV: No cyanosis Lungs: equal chest rise Abdomen: Soft, NTND, no rebound or guarding Gu: condom catheter in place with mild hematuria  Lab Results: Recent Labs    09/17/23 0529 09/18/23 0424 09/19/23 0450  HGB 7.2* 7.7* 7.2*  HCT 25.7* 27.0* 25.7*   BMET Recent Labs    09/18/23 0424 09/19/23 0450  NA 134* 134*  K 3.6 4.1  CL 101 100  CO2 24 23  GLUCOSE 91 85  BUN 76* 87*  CREATININE 4.16* 4.97*  CALCIUM 7.9* 7.8*     Studies/Results: DG Chest Port 1 View Result Date: 09/18/2023 CLINICAL DATA:  Cough. EXAM: PORTABLE CHEST 1 VIEW COMPARISON:  Chest radiograph dated 09/06/2023. FINDINGS: Cardiomegaly with findings of CHF and bilateral pleural  effusions similar or slightly progressed. No pneumothorax. No acute osseous pathology. IMPRESSION: Cardiomegaly with CHF and bilateral pleural effusions. Electronically Signed   By: Elgie Collard M.D.   On: 09/18/2023 13:08      LOS: 18 days   Elmon Kirschner, NP [facility] Pager: [phone]  09/19/2023, 10:14 AM

## 2023-09-19 NOTE — Assessment & Plan Note (Signed)
 09-19-2023 seen on CXR from yesterday. Pt remains on 2 L/min. May need thoracentesis.

## 2023-09-19 NOTE — Assessment & Plan Note (Signed)
 Prior to 09-19-2023 His home atenolol has been held for several days secondary to hypotension and that the week last week.  Lasix also held by the same reason.  Received IV albumin. -Blood pressure remains good but the tachycardia persists.  09-19-2023 remains on IVF at 85 ml/hr. Scr worsening. Transfer to [facility] for nephrology to consider HD initiation.

## 2023-09-19 NOTE — Progress Notes (Signed)
 Report called to 52M, given to [place], Charity fundraiser

## 2023-09-19 NOTE — Assessment & Plan Note (Signed)
Chronically on 2 L/min.

## 2023-09-19 NOTE — Assessment & Plan Note (Signed)
 Chronic. On lipitor 80 mg every other day.

## 2023-09-19 NOTE — Assessment & Plan Note (Signed)
 Stable

## 2023-09-19 NOTE — Assessment & Plan Note (Signed)
 09-19-2023 pt has large right pleural effusion on CXR from yesterday. Consider right thoracentesis. Unable to diurese due worsening Scr.

## 2023-09-19 NOTE — Progress Notes (Signed)
 PT Cancellation Note  Patient Details Name: FAHAD CISSE MRN: [id number] DOB: [birth year]   Cancelled Treatment:    Reason Eval/Treat Not Completed: Other (comment). Pt being transported to imaging upon therapy attempt. Pt awaiting transfer to [facility] at this time. Will continue to follow for acute PT.   Tori Coe Angelos PT, DPT 09/19/23, 1:08 PM

## 2023-09-20 ENCOUNTER — Inpatient Hospital Stay (HOSPITAL_COMMUNITY)

## 2023-09-20 DIAGNOSIS — L03116 Cellulitis of left lower limb: Secondary | ICD-10-CM | POA: Diagnosis not present

## 2023-09-20 HISTORY — PX: IR FLUORO GUIDE CV LINE RIGHT: IMG2283

## 2023-09-20 HISTORY — PX: IR US GUIDE VASC ACCESS RIGHT: IMG2390

## 2023-09-20 LAB — RENAL FUNCTION PANEL
Albumin: 2.1 g/dL — ABNORMAL LOW (ref 3.5–5.0)
Anion gap: 12 (ref 5–15)
BUN: 90 mg/dL — ABNORMAL HIGH (ref 8–23)
CO2: 25 mmol/L (ref 22–32)
Calcium: 8.1 mg/dL — ABNORMAL LOW (ref 8.9–10.3)
Chloride: 100 mmol/L (ref 98–111)
Creatinine, Ser: 6.23 mg/dL — ABNORMAL HIGH (ref 0.61–1.24)
GFR, Estimated: 9 mL/min — ABNORMAL LOW (ref >60)
Glucose, Bld: 80 mg/dL (ref 70–99)
Phosphorus: 7.6 mg/dL — ABNORMAL HIGH (ref 2.5–4.6)
Potassium: 4.2 mmol/L (ref 3.5–5.1)
Sodium: 137 mmol/L (ref 135–145)

## 2023-09-20 LAB — CBC
HCT: 27.7 % — ABNORMAL LOW (ref 39.0–52.0)
Hemoglobin: 8 g/dL — ABNORMAL LOW (ref 13.0–17.0)
MCH: 23.4 pg — ABNORMAL LOW (ref 26.0–34.0)
MCHC: 28.9 g/dL — ABNORMAL LOW (ref 30.0–36.0)
MCV: 81 fL (ref 80.0–100.0)
Platelets: 262 10*3/uL (ref 150–400)
RBC: 3.42 MIL/uL — ABNORMAL LOW (ref 4.22–5.81)
RDW: 25.2 % — ABNORMAL HIGH (ref 11.5–15.5)
WBC: 8.4 10*3/uL (ref 4.0–10.5)
nRBC: 0 % (ref 0.0–0.2)

## 2023-09-20 LAB — HEPATITIS B SURFACE ANTIGEN: Hepatitis B Surface Ag: NONREACTIVE

## 2023-09-20 MED ORDER — LIDOCAINE HCL 1 % IJ SOLN
INTRAMUSCULAR | Status: AC
  Filled 2023-09-20: qty 20

## 2023-09-20 MED ORDER — LIDOCAINE HCL URETHRAL/MUCOSAL 2 % EX GEL
1.0000 | Freq: Once | CUTANEOUS | Status: AC
  Administered 2023-09-20: 1 via URETHRAL
  Filled 2023-09-20: qty 6

## 2023-09-20 MED ORDER — ALUM & MAG HYDROXIDE-SIMETH 200-200-20 MG/5ML PO SUSP
30.0000 mL | Freq: Four times a day (QID) | ORAL | Status: DC | PRN
  Administered 2023-09-20 – 2023-10-01 (×5): 30 mL via ORAL
  Filled 2023-09-20 (×5): qty 30

## 2023-09-20 MED ORDER — CHLORHEXIDINE GLUCONATE CLOTH 2 % EX PADS
6.0000 | MEDICATED_PAD | Freq: Every day | CUTANEOUS | Status: DC
  Administered 2023-09-20 – 2023-09-29 (×5): 6 via TOPICAL

## 2023-09-20 MED ORDER — HEPARIN SODIUM (PORCINE) 1000 UNIT/ML IJ SOLN
INTRAMUSCULAR | Status: AC
  Filled 2023-09-20: qty 10

## 2023-09-20 NOTE — Plan of Care (Signed)
  Problem: Skin Integrity: Goal: Risk for impaired skin integrity will decrease Outcome: Progressing  Interventions Assess risk factors for imparied skin integrity and/or pressure injuries Initiate pressure injury prevention interventions if Braden Score </= 18 Assess/Monitor skin integrity, appearance and/or temperature Provide skin care

## 2023-09-20 NOTE — Progress Notes (Signed)
 PROGRESS NOTE  [name]:[id number] DOB: Oct 07, 1950 DOA: 09/01/2023 PCP: Kennith Center, NP   LOS: 19 days   Brief narrative:  Jordan [name] is an 73 y.o. male with past medical history of hypertension, heart failure with preserved ejection fraction, atrial fibrillation, CAD, COPD with chronic hypoxic respiratory failure on 2 L of oxygen was brought into the hospital from skilled nursing facility with worsening left leg wound.  Patient with history of chronic venous stasis.  Patient stated that he was doing well until he got a new wheelchair last week and the nurses aide tried to put him in bed by "lifting me over the hand rails of the bed with me still in my wheelchair".  Patient states that one of the nurses aide lost her balance and he scraped his leg and his skin came off.  In the ED patient was noted to have increased erythema over the denuded part of the skin.  WBC was elevated at 14, creatinine at 2.4 and blood pressure was 104/60.  Heart rate was 115.  Patient was started on vancomycin and cefepime and was admitted hospital for further evaluation and treatment.    Assessment and plan.  Principal Problem:   Left leg cellulitis Active Problems:   Acute renal failure (ARF) (HCC)   Gross hematuria   Hypotension   Pleural effusion on right   Essential hypertension   HLD (hyperlipidemia)   Chronic venous stasis dermatitis of both lower extremities   COPD (chronic obstructive pulmonary disease) (HCC)   S/P TAVR (transcatheter aortic valve replacement)   Chronic heart failure with preserved ejection fraction (HCC)   Persistent atrial fibrillation (HCC)   Chronic respiratory failure with hypoxia (HCC)  Assessment and Plan: * Left leg cellulitis Status post 10-day Keflex and 5-day course of doxycycline.  Patient also was seen by vascular surgery and underwent arteriogram on 325/25 with no significant peripheral vascular disease.  Erythema has improved.  Will need to  follow-up with Dr Lajoyce Corners as outpatient.   Gross hematuria Had dark urine with hematuria in his Foley catheter.  At this time hemoglobin has remained stable.  Seen by urology and Eliquis has been kept on hold.   Acute renal failure Creatinine continues to trend up.  Received IV fluids without improvement.  Creatinine today at 6.2.  Nephrology on board and plan for hemodialysis.   Pleural effusion on right Was on 2 L of nasal cannula oxygen.  Will continue to monitor.   Hypotension Blood pressure marginally low.  Will hold off with metoprolol since patient will go for dialysis today.   Chronic respiratory failure with hypoxia  Chronically on 2 L/min   Persistent atrial fibrillation  Hold off with metoprolol for now.  Eliquis on hold as well.   Chronic heart failure with preserved ejection fraction Patient had large right pleural effusion on CXR.  Might need thoracocentesis.    S/P TAVR (transcatheter aortic valve replacement) Stable.   COPD (chronic obstructive pulmonary disease) Nebulizers as needed.   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 Continue with wound care.   Hyperlipidemia Chronic. On lipitor 80 mg every other day.   Essential hypertension Holding antihypertensives for now.  Debility, deconditioning.  Patient is from assisted living.  Will need skilled nursing facility likely on discharge.  DVT prophylaxis: Place and maintain sequential compression device Start: 09/19/23 1109   Disposition: Likely to skilled nursing facility  Status is: Inpatient Remains inpatient appropriate because: pending clinical improvement, need  for hemodialysis    Code Status:     Code Status: Full Code  Family Communication: None at bedside  Consultants: Vascular surgery Nephrology urology   Procedures: 09-12-2023 Abdominal Aortogram  Anti-infectives:  Rocephin IV  Anti-infectives (From admission, onward)    Start     Dose/Rate Route  Frequency Ordered Stop   09/19/23 1200  cefTRIAXone (ROCEPHIN) 2 g in sodium chloride 0.9 % 100 mL IVPB        2 g 200 mL/hr over 30 Minutes Intravenous Every 24 hours 09/19/23 1106 09/22/23 1159   09/08/23 1545  cephALEXin (KEFLEX) capsule 500 mg        500 mg Oral Every 8 hours 09/08/23 1447 09/11/23 0545   09/08/23 1545  doxycycline (VIBRA-TABS) tablet 100 mg        100 mg Oral Every 12 hours 09/08/23 1447 09/12/23 2013   09/04/23 1445  cephALEXin (KEFLEX) capsule 500 mg        500 mg Oral Every 8 hours 09/04/23 1353 09/08/23 0509   09/02/23 1400  vancomycin (VANCOREADY) IVPB 1750 mg/350 mL  Status:  Discontinued        1,750 mg 175 mL/hr over 120 Minutes Intravenous Every 48 hours 09/01/23 1729 09/04/23 1353   09/01/23 1500  vancomycin (VANCOCIN) IVPB 1000 mg/200 mL premix  Status:  Discontinued        1,000 mg 200 mL/hr over 60 Minutes Intravenous  Once 09/01/23 1458 09/01/23 1504   09/01/23 1230  vancomycin (VANCOCIN) IVPB 1000 mg/200 mL premix        1,000 mg 200 mL/hr over 60 Minutes Intravenous  Once 09/01/23 1224 09/01/23 1418   09/01/23 1230  ceFEPIme (MAXIPIME) 2 g in sodium chloride 0.9 % 100 mL IVPB        2 g 200 mL/hr over 30 Minutes Intravenous  Once 09/01/23 1224 09/01/23 1343      Subjective: Today, patient was seen and examined at bedside.  Patient states that he has some shortness of breath and nausea.  Denies any chest pain fever chills or rigor.  Objective: Vitals:   09/20/23 0824 09/20/23 0834  BP: 118/73 118/73  Pulse: 99 99  Resp: 18 18  Temp: 97.8 F (36.6 C)   SpO2: 99% 95%    Intake/Output Summary (Last 24 hours) at 09/20/2023 1109 Last data filed at 09/20/2023 1610 Gross per 24 hour  Intake 673.26 ml  Output 500 ml  Net 173.26 ml   Filed Weights   09/17/23 0500 09/18/23 0716 09/19/23 0500  Weight: 102.1 kg 102 kg 103.5 kg   Body mass index is 30.1 kg/m.   Physical Exam: GENERAL: Patient is alert awake and oriented. Not in obvious  distress.  Obese build. HENT: No scleral pallor or icterus. Pupils equally reactive to light. Oral mucosa is moist NECK: is supple, no gross swelling noted. CHEST:   Diminished breath sounds bilaterally. CVS: S1 and S2 heard, no murmur. Regular rate and rhythm.  ABDOMEN: Soft, non-tender, bowel sounds are present. EXTREMITIES bilateral lower extremity edema with dressing.. CNS: Cranial nerves are intact.  Generalized weakness noted.  Moves all extremities. SKIN: warm and dry   Data Review: I have personally reviewed the following laboratory data and studies,  CBC: Recent Labs  Lab 09/15/23 0413 09/16/23 1100 09/17/23 0529 09/18/23 0424 09/19/23 0450  WBC 10.1 7.8 8.8 8.6 7.4  HGB 7.7* 7.0* 7.2* 7.7* 7.2*  HCT 26.7* 25.2* 25.7* 27.0* 25.7*  MCV 80.2 81.6 82.4 82.6 83.4  PLT 372 301 281 250 228   Basic Metabolic Panel: Recent Labs  Lab 09/16/23 1100 09/17/23 0529 09/17/23 0530 09/18/23 0424 09/19/23 0450 09/20/23 0355  NA 134* 135 136 134* 134* 137  K 3.5 3.6 3.8 3.6 4.1 4.2  CL 100 100 100 101 100 100  CO2 24 27 27 24 23 25   GLUCOSE 108* 90 91 91 85 80  BUN 69* 71* 73* 76* 87* 90*  CREATININE 3.35* 3.63* 3.53* 4.16* 4.97* 6.23*  CALCIUM 7.7* 7.9* 7.9* 7.9* 7.8* 8.1*  PHOS 5.0*  --  5.4* 6.1* 7.3* 7.6*   Liver Function Tests: Recent Labs  Lab 09/16/23 1100 09/17/23 0530 09/18/23 0424 09/19/23 0450 09/20/23 0355  ALBUMIN 2.1* 2.1* 2.3* 2.2* 2.1*   No results for input(s): "LIPASE", "AMYLASE" in the last 168 hours. No results for input(s): "AMMONIA" in the last 168 hours. Cardiac Enzymes: No results for input(s): "CKTOTAL", "CKMB", "CKMBINDEX", "TROPONINI" in the last 168 hours. BNP (last 3 results) No results for input(s): "BNP" in the last 8760 hours.  ProBNP (last 3 results) No results for input(s): "PROBNP" in the last 8760 hours.  CBG: No results for input(s): "GLUCAP" in the last 168 hours. No results found for this or any previous visit (from the  past 240 hours).   Studies: US RENAL Result Date: 09/19/2023 CLINICAL DATA:  Acute renal insufficiency. EXAM: RENAL / URINARY TRACT ULTRASOUND COMPLETE COMPARISON:  Renal ultrasound dated 09/06/2023. FINDINGS: Evaluation is limited due to body habitus. Right Kidney: Renal measurements: 10.8 x 4.5 x 5.3 cm = volume: 137 mL. Diffuse increased renal parenchymal echogenicity. No hydronephrosis or shadowing stone. A 12 mm hypoechoic lesion in the interpolar right kidney is poorly evaluated, possibly a cyst. Left Kidney: Renal measurements: 9.7 x 5.3 x 4.6 cm = volume: 123 mL. Diffuse parenchymal echogenicity. No hydronephrosis or shadowing stone. Bladder: The urinary bladder is poorly visualized and predominantly collapsed. Other: None. IMPRESSION: Echogenic kidneys in keeping with medical renal disease. No hydronephrosis or shadowing stone. Electronically Signed   By: Elgie Collard M.D.   On: 09/19/2023 17:27   DG Chest Bilateral Decubitus Result Date: 09/19/2023 CLINICAL DATA:  Pleural effusion. EXAM: CHEST - BILATERAL DECUBITUS VIEW COMPARISON:  Chest CT dated 09/19/2023. FINDINGS: There is cardiomegaly with vascular congestion and edema. Bilateral pleural effusions with associated atelectasis, right greater than left. No pneumothorax. Stable cardiac silhouette. No acute osseous pathology. IMPRESSION: 1. Cardiomegaly with vascular congestion and edema. 2. Bilateral pleural effusions with associated atelectasis, right greater than left. Electronically Signed   By: Elgie Collard M.D.   On: 09/19/2023 15:25   CT CHEST WO CONTRAST Result Date: 09/19/2023 CLINICAL DATA:  Dyspnea of unclear etiology EXAM: CT CHEST WITHOUT CONTRAST TECHNIQUE: Multidetector CT imaging of the chest was performed following the standard protocol without IV contrast. RADIATION DOSE REDUCTION: This exam was performed according to the departmental dose-optimization program which includes automated exposure control, adjustment of the mA  and/or kV according to patient size and/or use of iterative reconstruction technique. COMPARISON:  09/18/2023, 08/06/2020 FINDINGS: Cardiovascular: Unenhanced imaging of the heart demonstrates borderline cardiomegaly without pericardial effusion. Aortic valve prosthesis is identified. Diffuse atherosclerosis throughout the coronary vasculature. Normal caliber of the thoracic aorta. Diffuse aortic atherosclerosis. Assessment of the vascular lumen cannot be performed without IV contrast. Mediastinum/Nodes: Mild mediastinal adenopathy, measuring up to 17 mm in the precarinal region and 14 mm in the AP window. Assessment of the hilar regions is limited without IV contrast. Thyroid, trachea, and esophagus are  unremarkable. Lungs/Pleura: There are large bilateral pleural effusions, volume estimated at least 2 L each. Significant dependent compressive atelectasis, greatest in the lower lobes. Upper lobe predominant interlobular septal thickening and mild ground-glass airspace disease consistent with edema. No pneumothorax. Central airways are patent. Upper Abdomen: No acute abnormality. Musculoskeletal: There are no acute or destructive bony abnormalities. 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 Stable diffuse thoracic spondylosis and bilateral shoulder osteoarthritis. Reconstructed images demonstrate no additional findings. IMPRESSION: 1. Constellation of findings favoring congestive heart failure, with cardiomegaly, mild pulmonary edema, and large bilateral pleural effusions. 2. Mediastinal lymphadenopathy, slightly more pronounced than prior study. 3. Aortic Atherosclerosis (ICD10-I70.0). Coronary artery atherosclerosis. Electronically Signed   By: Sharlet Salina M.D.   On: 09/19/2023 14:59      Joycelyn Das, MD  [facility] 09/20/2023  If 7PM-7AM, please contact night-coverage

## 2023-09-20 NOTE — Plan of Care (Signed)
  Problem: Education: Goal: Knowledge of General Education information will improve Description: Including pain rating scale, medication(s)/side effects and non-pharmacologic comfort measures Outcome: Progressing   Problem: Health Behavior/Discharge Planning: Goal: Ability to manage health-related needs will improve Outcome: Progressing   Problem: Clinical Measurements: Goal: Ability to maintain clinical measurements within normal limits will improve Outcome: Progressing Goal: Will remain free from infection Outcome: Progressing Goal: Diagnostic test results will improve Outcome: Progressing Goal: Respiratory complications will improve Outcome: Progressing Goal: Cardiovascular complication will be avoided Outcome: Progressing   Problem: Activity: Goal: Risk for activity intolerance will decrease Outcome: Progressing   Problem: Nutrition: Goal: Adequate nutrition will be maintained Outcome: Progressing   Problem: Coping: Goal: Level of anxiety will decrease Outcome: Progressing   Problem: Elimination: Goal: Will not experience complications related to bowel motility Outcome: Progressing Goal: Will not experience complications related to urinary retention Outcome: Progressing   Problem: Pain Managment: Goal: General experience of comfort will improve and/or be controlled Outcome: Progressing   Problem: Safety: Goal: Ability to remain free from injury will improve Outcome: Progressing   Problem: Skin Integrity: Goal: Risk for impaired skin integrity will decrease Outcome: Progressing   Problem: Clinical Measurements: Goal: Ability to avoid or minimize complications of infection will improve Outcome: Progressing   Problem: Skin Integrity: Goal: Skin integrity will improve Outcome: Progressing   Problem: Education: Goal: Understanding of CV disease, CV risk reduction, and recovery process will improve Outcome: Progressing Goal: Individualized Educational  Video(s) Outcome: Progressing   Problem: Activity: Goal: Ability to return to baseline activity level will improve Outcome: Progressing   Problem: Cardiovascular: Goal: Ability to achieve and maintain adequate cardiovascular perfusion will improve Outcome: Progressing Goal: Vascular access site(s) Level 0-1 will be maintained Outcome: Progressing   Problem: Health Behavior/Discharge Planning: Goal: Ability to safely manage health-related needs after discharge will improve Outcome: Progressing

## 2023-09-20 NOTE — Progress Notes (Signed)
 Physical Therapy Treatment Patient Details Name: Jordan [name] MRN: [id number] DOB: [birth year] Today's Date: 09/20/2023   History of Present Illness 73 yo male admitted with soft tissue infection/L LE wound, L toe laceration after sustaining a fall at ALF while be transferred back to bed per pt report. Hx of TAVR, bil LE edema, R TKA 07/2021, DM, Afib, anemia, anxiety, CHF, COPD-on O2 PRN per pt, obesity, aortic stenosis, venous insufficiency (javascript:void(0);)    PT Comments  Patient resting in bed and agreeable to mobilize with PT/OT with min encouragement. Pt required Max+2 for bed mobility and use of bed pad to scoot to EOB. Stedy utilized for functional strengthening with repeated sit<>stands. Pt completed 4x sit<>stand with Max+2 and EOB slightly elevated. Pt able to stand for 5-20 seconds requiring encouragement to maintain upright standing for placement of bedpan, pericare, and shift of Stedy to move superior towards HOB. EOS pt returned to supine in bed and Alarm on and call bell within reach, and repositioned for comfort. Will continue to progress pt as able during stay. Patient will benefit from continued inpatient follow up therapy, <3 hours/day.    If plan is discharge home, recommend the following: Assistance with cooking/housework;Assist for transportation;Help with stairs or ramp for entrance;Two people to help with walking and/or transfers;Two people to help with bathing/dressing/bathroom   Can travel by private vehicle     No  Equipment Recommendations  None recommended by PT    Recommendations for Other Services       Precautions / Restrictions Precautions Precautions: Fall;Other (comment) Precaution/Restrictions Comments: multiple LE wounds-tender to touch; chronic 2L O2 at baseline Restrictions Weight Bearing Restrictions Per Provider Order: No     Mobility  Bed Mobility Overal bed mobility: Needs Assistance Bed Mobility: Supine to Sit Rolling: Max assist,  +2 for physical assistance, Used rails   Supine to sit: Max assist, HOB elevated, Used rails Sit to supine: Max assist, +2 for physical assistance, HOB elevated   General bed mobility comments: pt sitting EOB less than 2-3 minutes before requesting to return to supine; therapists encouraged pt to continue sitting EOB and for use of Stedy for standing    Transfers Overall transfer level: Needs assistance   Transfers: Sit to/from Stand Sit to Stand: From elevated surface, Via lift equipment, Max assist, +2 physical assistance           General transfer comment: 4 trials standing with Stedy. Pt also sat on bed pan at EOB for BM Transfer via Lift Equipment: Stedy  Ambulation/Gait                   Stairs             Wheelchair Mobility     Tilt Bed    Modified Rankin (Stroke Patients Only)       Balance Overall balance assessment: Needs assistance Sitting-balance support: Bilateral upper extremity supported, Feet supported Sitting balance-Leahy Scale: Fair Sitting balance - Comments: BUE support at all times, without UE support neednig min A to maintain sitting EOB   Standing balance support: Bilateral upper extremity supported, Reliant on assistive device for balance Standing balance-Leahy Scale: Zero Standing balance comment: reliant on steady and +2 external support                            Communication Communication Communication: No apparent difficulties  Cognition Arousal: Alert Behavior During Therapy: WFL for tasks assessed/performed   PT -  Cognitive impairments: No apparent impairments                         Following commands: Intact      Cueing Cueing Techniques: Verbal cues  Exercises      General Comments        Pertinent Vitals/Pain Pain Assessment Pain Assessment: Faces Pain Score:  (reports a 15/10) Faces Pain Scale: Hurts even more Pain Location: generalized with movement, LEs Pain Descriptors  / Indicators: Grimacing, Guarding, Discomfort Pain Intervention(s): Limited activity within patient's tolerance, Monitored during session, Repositioned    Home Living                          Prior Function            PT Goals (current goals can now be found in the care plan section) Acute Rehab PT Goals Patient Stated Goal: less pain. return to ALF PT Goal Formulation: With patient Time For Goal Achievement: 09/19/23 Potential to Achieve Goals: Fair Progress towards PT goals: Progressing toward goals    Frequency    Min 2X/week      PT Plan      Co-evaluation   Reason for Co-Treatment: For patient/therapist safety;To address functional/ADL transfers;Complexity of the patient's impairments (multi-system involvement)   OT goals addressed during session: ADL's and self-care;Proper use of Adaptive equipment and DME      AM-PAC PT "6 Clicks" Mobility   Outcome Measure  Help needed turning from your back to your side while in a flat bed without using bedrails?: A Lot Help needed moving from lying on your back to sitting on the side of a flat bed without using bedrails?: A Lot Help needed moving to and from a bed to a chair (including a wheelchair)?: Total Help needed standing up from a chair using your arms (e.g., wheelchair or bedside chair)?: Total Help needed to walk in hospital room?: Total Help needed climbing 3-5 steps with a railing? : Total 6 Click Score: 8    End of Session Equipment Utilized During Treatment: Gait belt;Oxygen Activity Tolerance: Patient tolerated treatment well;Patient limited by fatigue Patient left: in bed;with call bell/phone within reach;with bed alarm set;with family/visitor present Nurse Communication: Mobility status;Need for lift equipment PT Visit Diagnosis: Muscle weakness (generalized) (M62.81);History of falling (Z91.81) Pain - part of body: Leg     Time: 1311-1346 PT Time Calculation (min) (ACUTE ONLY): 35  min  Charges:    $Therapeutic Activity: 8-22 mins PT General Charges $$ ACUTE PT VISIT: 1 Visit                     Wynn Maudlin, DPT Acute Rehabilitation Services Office [phone]  09/20/23 3:51 PM

## 2023-09-20 NOTE — Procedures (Signed)
 Interventional Radiology Procedure Note  Procedure: Placement of a right IJ approach non tunneled triple lumen HD catheter.   Tip is positioned at the superior cavoatrial junction and catheter is ready for immediate use.   Complications: None Recommendations:  - Ok to use - Do not submerge - Routine line care   Signed,  Yvone Neu. Loreta Ave, DO, ABVM, RPVI

## 2023-09-20 NOTE — Progress Notes (Signed)
 Completed 3 hours of dialysis. Tolerated net fluid removal of 2L. Low blood pressure noted, SBP 90's, patient asymptomatic. RIJ U[place]all catheter saline-locked and de-accessed using aseptic technique. Clamped and replaced caps securely. Catheter dressing cdi. Patient is alert and conversant. No signs of distress upon leaving dialysis unit. Handoff report to Edwena Felty.

## 2023-09-20 NOTE — Progress Notes (Signed)
 8 Days Post-Op Subjective: Jordan [name]. Pleasant with no specific complaints.   Objective: Vital signs in last 24 hours: Temp:  [97.3 F (36.3 C)-98.3 F (36.8 C)] 98 F (36.7 C) (04/02 0834) Pulse Rate:  [94-103] 99 (04/02 0834) Resp:  [18] 18 (04/02 0834) BP: (101-126)/(68-83) 118/73 (04/02 0834) SpO2:  [92 %-99 %] 95 % (04/02 0834)  Assessment/Plan: #hematuria Eliquis held  Pt cannot tolerate CT hematuria d/t kidney injury. Place coude catheter and collect CT cystogram to assess bladder clot burden.  Will need outpt cystoscopy at some point.  His UA was not remarkable on 3/27, though there was rare bacteria. Sample has been disposed of. New UA/culture would be color contaminated. Reasonable to treat him for UTI empirically. -now receiving rocephin Trend labs. Interval improvement today. HgB 7.2-->8.0 today Failing the above, can consider cystoscopy with fulguration.    Intake/Output from previous day: 04/01 0701 - 04/02 0700 In: 943.9 [P.O.:480; I.V.:363.9; IV Piggyback:100] Out: 750 [Urine:750]  Intake/Output this shift: No intake/output data recorded.  Physical Exam:  General: Alert and oriented CV: No cyanosis Lungs: equal chest rise Abdomen: Soft, NTND, no rebound or guarding Gu: condom catheter in place with coffee colored urine, no clots noted  Lab Results: Recent Labs    09/18/23 0424 09/19/23 0450 09/20/23 1212  HGB 7.7* 7.2* 8.0*  HCT 27.0* 25.7* 27.7*   BMET Recent Labs    09/19/23 0450 09/20/23 0355  NA 134* 137  K 4.1 4.2  CL 100 100  CO2 23 25  GLUCOSE 85 80  BUN 87* 90*  CREATININE 4.97* 6.23*  CALCIUM 7.8* 8.1*     Studies/Results: US RENAL Result Date: 09/19/2023 CLINICAL DATA:  Acute renal insufficiency. EXAM: RENAL / URINARY TRACT ULTRASOUND COMPLETE COMPARISON:  Renal ultrasound dated 09/06/2023. FINDINGS: Evaluation is limited due to body habitus. Right Kidney: Renal measurements: 10.8 x 4.5 x 5.3 cm = volume: 137 mL. Diffuse  increased renal parenchymal echogenicity. No hydronephrosis or shadowing stone. A 12 mm hypoechoic lesion in the interpolar right kidney is poorly evaluated, possibly a cyst. Left Kidney: Renal measurements: 9.7 x 5.3 x 4.6 cm = volume: 123 mL. Diffuse parenchymal echogenicity. No hydronephrosis or shadowing stone. Bladder: The urinary bladder is poorly visualized and predominantly collapsed. Other: None. IMPRESSION: Echogenic kidneys in keeping with medical renal disease. No hydronephrosis or shadowing stone. Electronically Signed   By: Elgie Collard M.D.   On: 09/19/2023 17:27   DG Chest Bilateral Decubitus Result Date: 09/19/2023 CLINICAL DATA:  Pleural effusion. EXAM: CHEST - BILATERAL DECUBITUS VIEW COMPARISON:  Chest CT dated 09/19/2023. FINDINGS: There is cardiomegaly with vascular congestion and edema. Bilateral pleural effusions with associated atelectasis, right greater than left. No pneumothorax. Stable cardiac silhouette. No acute osseous pathology. IMPRESSION: 1. Cardiomegaly with vascular congestion and edema. 2. Bilateral pleural effusions with associated atelectasis, right greater than left. Electronically Signed   By: Elgie Collard M.D.   On: 09/19/2023 15:25   CT CHEST WO CONTRAST Result Date: 09/19/2023 CLINICAL DATA:  Dyspnea of unclear etiology EXAM: CT CHEST WITHOUT CONTRAST TECHNIQUE: Multidetector CT imaging of the chest was performed following the standard protocol without IV contrast. RADIATION DOSE REDUCTION: This exam was performed according to the departmental dose-optimization program which includes automated exposure control, adjustment of the mA and/or kV according to patient size and/or use of iterative reconstruction technique. COMPARISON:  09/18/2023, 08/06/2020 FINDINGS: Cardiovascular: Unenhanced imaging of the heart demonstrates borderline cardiomegaly without pericardial effusion. Aortic valve prosthesis is identified. Diffuse atherosclerosis throughout  the coronary  vasculature. Normal caliber of the thoracic aorta. Diffuse aortic atherosclerosis. Assessment of the vascular lumen cannot be performed without IV contrast. Mediastinum/Nodes: Mild mediastinal adenopathy, measuring up to 17 mm in the precarinal region and 14 mm in the AP window. Assessment of the hilar regions is limited without IV contrast. Thyroid, trachea, and esophagus are unremarkable. Lungs/Pleura: There are large bilateral pleural effusions, volume estimated at least 2 L each. Significant dependent compressive atelectasis, greatest in the lower lobes. Upper lobe predominant interlobular septal thickening and mild ground-glass airspace disease consistent with edema. No pneumothorax. Central airways are patent. Upper Abdomen: No acute abnormality. Musculoskeletal: There are no acute or destructive bony abnormalities. 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 Stable diffuse thoracic spondylosis and bilateral shoulder osteoarthritis. Reconstructed images demonstrate no additional findings. IMPRESSION: 1. Constellation of findings favoring congestive heart failure, with cardiomegaly, mild pulmonary edema, and large bilateral pleural effusions. 2. Mediastinal lymphadenopathy, slightly more pronounced than prior study. 3. Aortic Atherosclerosis (ICD10-I70.0). Coronary artery atherosclerosis. Electronically Signed   By: Sharlet Salina M.D.   On: 09/19/2023 14:59      LOS: 19 days   Elmon Kirschner, NP [facility] Pager: [phone]  09/20/2023, 1:41 PM

## 2023-09-20 NOTE — Progress Notes (Signed)
 Patient ID: Jordan [name], male   DOB: 04/01/1951, 73 y.o.   MRN: [id number] S: No new complaints this morning. O:BP 118/73   Pulse 99   Temp 97.8 F (36.6 C) (Oral)   Resp 18   Ht 6\' 1"  (1.854 m)   Wt 103.5 kg   SpO2 95%   BMI 30.10 kg/m   Gen: NAD CVS: tachy Resp: CTA Abd: +BS, soft, NT/ND Ext: minimal LE edema, bandages on BLLE  Assessment/ Plan: AKI: b/l creat is 1.3, from PCP's office in Jan 2024. Assuming this is AKI but could also have CKD. UA shows some rbc's/ wbc's, no proteinuria. Renal US w/o obstruction. BP's were soft in 90s on presentation and then improved after IV abx and IVF's, but pt developed pulm edema on CXR #2 w/ bilat LE edema. Suspect CKD and/or ATN / vol overload as cause of renal failure. Started IV lasix 3/19, titrated up the dose. He dropped his Bp on 3/23 so Lasix was decreased to 80 mg IV tid, then stopped 09/11/23.  UOP dropped but Scr stable.   He did have a renal CO2 angiogram which was negative for renal artery stenosis. Then we po lasix was started w/ increased UOP but unfortunately the Scr rose up to 3.6 yest and then 4.1.  Repeat renal US w/o hydro. IVF"s not helping, creat 6 today. Will dc IVF"s and will need HD today, consulting IR for HD cath.  Volume - diffuse LE edema, mild-moderate BP - bp's soft, will d/w pmd, may need alternative for afib rate control Gross hematuria: appreciate urology assistance.  Soft tissue infection: SP IV abx, is now on po keflex Anemia - hgb down to 6.7.  Received blood transfusion this wknd.  COPD Atrial fib S/p TAVR Chronic resp failure - on home O2 2 L HTN - holding home meds (BB) Disposition - came from assisted living at [facility]  Vinson Moselle  MD  [facility] 09/20/2023, 9:29 AM  Recent Labs  Lab 09/18/23 0424 09/19/23 0450 09/20/23 0355  HGB 7.7* 7.2*  --   ALBUMIN 2.3* 2.2* 2.1*  CALCIUM 7.9* 7.8* 8.1*  PHOS 6.1* 7.3* 7.6*  CREATININE 4.16* 4.97* 6.23*  K 3.6 4.1 4.2    Inpatient medications:   atorvastatin  80 mg Oral QODAY   Chlorhexidine Gluconate Cloth  6 each Topical Q0600   LORazepam  1 mg Oral BID   metoprolol tartrate  12.5 mg Oral BID   mometasone-formoterol  2 puff Inhalation BID   multivitamin with minerals  1 tablet Oral Q breakfast   pantoprazole  40 mg Oral Daily   sevelamer carbonate  800 mg Oral TID WC   sodium chloride flush  3 mL Intravenous Q12H   venlafaxine XR  75 mg Oral Q breakfast    cefTRIAXone (ROCEPHIN)  IV Stopped (09/19/23 1415)   acetaminophen **OR** acetaminophen, acetaminophen, albuterol, alum & mag hydroxide-simeth, camphor-menthol, hydrALAZINE, melatonin, ondansetron (ZOFRAN) IV, oxyCODONE, polyethylene glycol, sodium chloride flush, traMADol

## 2023-09-20 NOTE — Progress Notes (Signed)
 Occupational Therapy Treatment Patient Details Name: Jordan [name] MRN: [id number] DOB: 04/07/1951 Today's Date: 09/20/2023   History of present illness 73 yo male admitted with soft tissue infection/L LE wound, L toe laceration after sustaining a fall at ALF while be transferred back to bed per pt report. 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 Pt required encouragement but agreeable. Pt stood x 4 reps from elevated EOB with Stedy with Max A +2 person, increased time and failure to stand fully upright enough to deploy stedy flap for seat. Pt required RUE assist to reach bar and partially elevated EOB. Pt sitting EOB less than 2-3 minutes before requesting to return to supine; therapists encouraged pt to continue sitting EOB and for use of Stedy for standing.. Pt also sat on bed pan at EOB for BM. OT will continue to follow acutely to maximize level of function and safety      If plan is discharge home, recommend the following:  Two people to help with walking and/or transfers;Two people to help with bathing/dressing/bathroom;Assist for transportation;Direct supervision/assist for medications management;Help with stairs or ramp for entrance   Equipment Recommendations  None recommended by OT    Recommendations for Other Services      Precautions / Restrictions Precautions Precautions: Fall;Other (comment) Precaution/Restrictions Comments: multiple LE wounds-tender to touch; chronic 2L O2 at baseline Restrictions Weight Bearing Restrictions Per Provider Order: No       Mobility Bed Mobility Overal bed mobility: Needs Assistance Bed Mobility: Supine to Sit Rolling: Max assist, +2 for physical assistance   Supine to sit: Max assist, HOB elevated, Used rails Sit to supine: Max assist, +2 for physical assistance, HOB  elevated   General bed mobility comments: pt sitting EOB less than 2-3 minutes before requesting to return to supine; therapists encouraged pt to continue sitting EOB and for use of Stedy for standing    Transfers Overall transfer level: Needs assistance   Transfers: Sit to/from Stand Sit to Stand: From elevated surface, Via lift equipment, Max assist, +2 physical assistance           General transfer comment: 4 trials standing with Stedy. Pt also sat on bed pan at EOB for BM Transfer via Lift Equipment: Stedy   Balance   Sitting-balance support: Bilateral upper extremity supported, Feet supported Sitting balance-Leahy Scale: Fair Sitting balance - Comments: BUE support at all times, without UE support neednig min A to maintain sitting EOB   Standing balance support: No upper extremity supported, During functional activity, Reliant on assistive device for balance Standing balance-Leahy Scale: Zero                             ADL either performed or assessed with clinical judgement   ADL Overall ADL's : Needs assistance/impaired     Grooming: Wash/dry hands;Wash/dry face;Contact guard assist;Sitting           Upper Body Dressing : Maximal assistance;Sitting;Cueing for sequencing;Cueing for compensatory techniques;Cueing for safety         Toilet Transfer Details (indicate cue type and reason): Pt stood x 4 reps from elevated EOB with Stedy with Max A +2 person, increased time and failure to stand fully upright enough to deploy stedy flap for seat. Pt required RUE assist to reach  bar and partially elevated EOB Toileting- Clothing Manipulation and Hygiene: Total assistance;Sit to/from stand Toileting - Clothing Manipulation Details (indicate cue type and reason): at [facility] for posterior hygiene     Functional mobility during ADLs: Maximal assistance;+2 for physical assistance;+2 for safety/equipment;Cueing for safety;Cueing for sequencing       Extremity/Trunk Assessment Upper Extremity Assessment Upper Extremity Assessment: Generalized weakness;Right hand dominant;RUE deficits/detail RUE Deficits / Details: Extremely limited shoulder ROM and arthrtiic looking GH joint, anterior placement. Unable to actively move upper arm away from body for FF or latearl ABD. Pt endorses UEs are weaker now than prior to hospitalization.   Lower Extremity Assessment Lower Extremity Assessment: Defer to PT evaluation   Cervical / Trunk Assessment Cervical / Trunk Assessment: Kyphotic    Vision Baseline Vision/History: 1 Wears glasses Ability to See in Adequate Light: 0 Adequate Patient Visual Report: No change from baseline     Perception     Praxis     Communication Communication Communication: No apparent difficulties   Cognition Arousal: Alert Behavior During Therapy: WFL for tasks assessed/performed Cognition: No apparent impairments             OT - Cognition Comments: requires max encouragement to fully participate despite education on importance/benefits of therapy and being OOB                          Cueing      Exercises      Shoulder Instructions       General Comments      Pertinent Vitals/ Pain       Pain Assessment Pain Assessment: 0-10 Pain Score: 10-Worst pain ever Pain Location: generalized with movement, LEs Pain Descriptors / Indicators: Grimacing, Guarding, Discomfort Pain Intervention(s): Limited activity within patient's tolerance, Monitored during session, Repositioned  Home Living                                          Prior Functioning/Environment              Frequency  Min 1X/week        Progress Toward Goals  OT Goals(current goals can now be found in the care plan section)  Progress towards OT goals: OT to reassess next treatment     Plan      Co-evaluation    PT/OT/SLP Co-Evaluation/Treatment: Yes Reason for Co-Treatment: For  patient/therapist safety;To address functional/ADL transfers;Complexity of the patient's impairments (multi-system involvement)   OT goals addressed during session: ADL's and self-care;Proper use of Adaptive equipment and DME      AM-PAC OT "6 Clicks" Daily Activity     Outcome Measure   Help from another person eating meals?: None Help from another person taking care of personal grooming?: A Little Help from another person toileting, which includes using toliet, bedpan, or urinal?: Total Help from another person bathing (including washing, rinsing, drying)?: Total Help from another person to put on and taking off regular upper body clothing?: A Lot Help from another person to put on and taking off regular lower body clothing?: Total 6 Click Score: 12    End of Session Equipment Utilized During Treatment: Gait belt;Oxygen;Other (comment) Antony Salmon)  OT Visit Diagnosis: Other abnormalities of gait and mobility (R26.89);Muscle weakness (generalized) (M62.81);Pain Pain - part of body:  (LEs)   Activity Tolerance Patient limited by pain;Patient limited by  fatigue   Patient Left in bed;with call bell/phone within reach   Nurse Communication          Time: 1610-9604 OT Time Calculation (min): 34 min  Charges: OT General Charges $OT Visit: 1 Visit OT Treatments $Self Care/Home Management : 8-22 mins   Galen Manila 09/20/2023, 3:34 PM

## 2023-09-20 NOTE — Care Management Important Message (Signed)
 Important Message  Patient Details  Name: Jordan [name] MRN: [id number] Date of Birth: February 06, 1951   Important Message Given:  Yes - Medicare IM     Dorena Bodo 09/20/2023, 3:29 PM

## 2023-09-21 DIAGNOSIS — L03116 Cellulitis of left lower limb: Secondary | ICD-10-CM | POA: Diagnosis not present

## 2023-09-21 LAB — CBC
HCT: 25.3 % — ABNORMAL LOW (ref 39.0–52.0)
Hemoglobin: 7.5 g/dL — ABNORMAL LOW (ref 13.0–17.0)
MCH: 24.1 pg — ABNORMAL LOW (ref 26.0–34.0)
MCHC: 29.6 g/dL — ABNORMAL LOW (ref 30.0–36.0)
MCV: 81.4 fL (ref 80.0–100.0)
Platelets: 226 10*3/uL (ref 150–400)
RBC: 3.11 MIL/uL — ABNORMAL LOW (ref 4.22–5.81)
RDW: 25.2 % — ABNORMAL HIGH (ref 11.5–15.5)
WBC: 8.7 10*3/uL (ref 4.0–10.5)
nRBC: 0 % (ref 0.0–0.2)

## 2023-09-21 LAB — BASIC METABOLIC PANEL WITH GFR
Anion gap: 10 (ref 5–15)
BUN: 55 mg/dL — ABNORMAL HIGH (ref 8–23)
CO2: 26 mmol/L (ref 22–32)
Calcium: 7.9 mg/dL — ABNORMAL LOW (ref 8.9–10.3)
Chloride: 97 mmol/L — ABNORMAL LOW (ref 98–111)
Creatinine, Ser: 4.4 mg/dL — ABNORMAL HIGH (ref 0.61–1.24)
GFR, Estimated: 13 mL/min — ABNORMAL LOW (ref >60)
Glucose, Bld: 98 mg/dL (ref 70–99)
Potassium: 3.6 mmol/L (ref 3.5–5.1)
Sodium: 133 mmol/L — ABNORMAL LOW (ref 135–145)

## 2023-09-21 LAB — RENAL FUNCTION PANEL
Albumin: 2 g/dL — ABNORMAL LOW (ref 3.5–5.0)
Anion gap: 11 (ref 5–15)
BUN: 54 mg/dL — ABNORMAL HIGH (ref 8–23)
CO2: 26 mmol/L (ref 22–32)
Calcium: 7.8 mg/dL — ABNORMAL LOW (ref 8.9–10.3)
Chloride: 96 mmol/L — ABNORMAL LOW (ref 98–111)
Creatinine, Ser: 4.39 mg/dL — ABNORMAL HIGH (ref 0.61–1.24)
GFR, Estimated: 13 mL/min — ABNORMAL LOW (ref >60)
Glucose, Bld: 97 mg/dL (ref 70–99)
Phosphorus: 4.4 mg/dL (ref 2.5–4.6)
Potassium: 3.7 mmol/L (ref 3.5–5.1)
Sodium: 133 mmol/L — ABNORMAL LOW (ref 135–145)

## 2023-09-21 LAB — HEPATITIS B SURFACE ANTIBODY, QUANTITATIVE: Hep B S AB Quant (Post): <3.5 m[IU]/mL — ABNORMAL LOW

## 2023-09-21 LAB — MAGNESIUM: Magnesium: 1.8 mg/dL (ref 1.7–2.4)

## 2023-09-21 MED ORDER — HEPARIN SODIUM (PORCINE) 1000 UNIT/ML DIALYSIS
1000.0000 [IU] | INTRAMUSCULAR | Status: DC | PRN
  Administered 2023-09-21: 2800 [IU]
  Filled 2023-09-21 (×3): qty 1

## 2023-09-21 MED ORDER — PENTAFLUOROPROP-TETRAFLUOROETH EX AERO: 1.0000 | INHALATION_SPRAY | CUTANEOUS | Status: DC | PRN

## 2023-09-21 MED ORDER — ALBUMIN HUMAN 25 % IV SOLN
25.0000 g | INTRAVENOUS | Status: AC | PRN
  Administered 2023-09-26: 25 g via INTRAVENOUS
  Filled 2023-09-21: qty 100

## 2023-09-21 MED ORDER — LIDOCAINE-PRILOCAINE 2.5-2.5 % EX CREA: 1.0000 | TOPICAL_CREAM | CUTANEOUS | Status: DC | PRN

## 2023-09-21 MED ORDER — SODIUM CHLORIDE 0.9 % IV SOLN
2.0000 g | INTRAVENOUS | Status: DC
  Filled 2023-09-21: qty 20

## 2023-09-21 MED ORDER — NEPRO/CARBSTEADY PO LIQD: 237.0000 mL | ORAL | Status: DC | PRN

## 2023-09-21 MED ORDER — ALTEPLASE 2 MG IJ SOLR: 2.0000 mg | Freq: Once | INTRAMUSCULAR | Status: DC | PRN

## 2023-09-21 MED ORDER — ANTICOAGULANT SODIUM CITRATE 4% (200MG/5ML) IV SOLN: 5.0000 mL | Status: DC | PRN

## 2023-09-21 MED ORDER — CHLORHEXIDINE GLUCONATE CLOTH 2 % EX PADS
6.0000 | MEDICATED_PAD | Freq: Every day | CUTANEOUS | Status: DC
Start: 2023-09-21 — End: 2023-10-01
  Administered 2023-09-22 – 2023-10-01 (×10): 6 via TOPICAL

## 2023-09-21 MED ORDER — LIDOCAINE HCL (PF) 1 % IJ SOLN: 5.0000 mL | INTRAMUSCULAR | Status: DC | PRN

## 2023-09-21 MED ORDER — HEPARIN SODIUM (PORCINE) 1000 UNIT/ML DIALYSIS
2000.0000 [IU] | Freq: Once | INTRAMUSCULAR | Status: AC
  Administered 2023-09-21: 2000 [IU] via INTRAVENOUS_CENTRAL
  Filled 2023-09-21: qty 2

## 2023-09-21 MED ORDER — HEPARIN SODIUM (PORCINE) 1000 UNIT/ML IJ SOLN
2000.0000 [IU] | Freq: Once | INTRAMUSCULAR | Status: AC
  Administered 2023-09-21: 2000 [IU] via INTRAVENOUS

## 2023-09-21 MED ORDER — ORAL CARE MOUTH RINSE: 15.0000 mL | OROMUCOSAL | Status: DC | PRN

## 2023-09-21 MED ORDER — HEPARIN SODIUM (PORCINE) 1000 UNIT/ML DIALYSIS
2000.0000 [IU] | INTRAMUSCULAR | Status: DC | PRN
  Filled 2023-09-21: qty 2

## 2023-09-21 NOTE — Procedures (Signed)
 Received patient in bed to unit.  Alert and oriented.  Informed consent signed and in chart.   TX duration: 3 hours  Patient tolerated well.  Transported back to the room  Alert, without acute distress.  Hand-off given to patient's nurse.   Access used: right cath Access issues: none  Total UF removed: 3 liters Medication(s) given: heparin  Lu Duffel, RN [facility]

## 2023-09-21 NOTE — Progress Notes (Signed)
  [facility] Progress Note  Subjective:  Had 1st HD yest w/ 2 L  Bp's were soft but not real low No new c/o's today Doesn't remember going to HD  Vitals:   09/20/23 2310 09/20/23 2322 09/21/23 0649 09/21/23 0854  BP: 107/76 98/68 125/75 114/78  Pulse: (!) 118 100  (!) 110  Resp: (!) 21 20  18   Temp: 97.7 F (36.5 C) 97.6 F (36.4 C) 97.6 F (36.4 C) 98.6 F (37 C)  TempSrc:  Oral    SpO2: 93% 95% 100% 97%  Weight:      Height:        Exam: Gen: NAD, [place] O2 CVS: tachy Resp: CTA Abd: +BS, soft, NT/ND Ext: 2+ bilat hip edema  Assessment/ Plan: AKI: b/l creat is 1.3, from PCP's office in Jan 2024. Assuming this is AKI but could also have CKD. UA shows some rbc's/ wbc's, no proteinuria. Renal US w/o obstruction. BP's were soft in 90s on presentation and then improved after IV abx and IVF's, but pt developed pulm edema on CXR #2 w/ bilat LE edema. Suspect AKI due to CHF/ cardiorenal vs ATN vs other. Started IV lasix 3/19 and titrated up the dose. Diuresed well x several days then BP dropped 3/23 so Lasix was decreased to 80 mg IV tid, then stopped 3/24.  UOP dropped but Scr stable. A renal CO2 angiogram was negative for renal artery stenosis. Then lasix po was started w/ increased UOP but unfortunately the Scr rose up to 3 then 4 then 6.  Repeat renal US showed no hydro, exam and CXR showed persistent fluid overload. IR placed temp cath 4/02. Pt was started on iHD yesterday. Plan HD again today.  Volume - diffuse LE edema, mild-moderate BP - bp's soft, will d/w pmd, may need alternative for afib rate control Gross hematuria: appreciate urology assistance.  Soft tissue infection: SP IV abx, is now on po keflex Anemia - hgb down to 6.7.  Received blood transfusion this wknd.  COPD Atrial fib S/p TAVR Chronic resp failure - on home O2 2 L Disposition - came from assisted living at [facility]  MD  [facility] 09/21/2023, 10:04 AM  Recent Labs  Lab  09/20/23 0355 09/20/23 1212 09/21/23 0315  HGB  --  8.0* 7.5*  ALBUMIN 2.1*  --  2.0*  CALCIUM 8.1*  --  7.9*  7.8*  PHOS 7.6*  --  4.4  CREATININE 6.23*  --  4.40*  4.39*  K 4.2  --  3.6  3.7    Inpatient medications:  atorvastatin  80 mg Oral QODAY   Chlorhexidine Gluconate Cloth  6 each Topical Q0600   Chlorhexidine Gluconate Cloth  6 each Topical Q0600   LORazepam  1 mg Oral BID   mometasone-formoterol  2 puff Inhalation BID   multivitamin with minerals  1 tablet Oral Q breakfast   pantoprazole  40 mg Oral Daily   sevelamer carbonate  800 mg Oral TID WC   sodium chloride flush  3 mL Intravenous Q12H   venlafaxine XR  75 mg Oral Q breakfast    albumin human     cefTRIAXone (ROCEPHIN)  IV 2 g (09/20/23 1311)   acetaminophen **OR** acetaminophen, acetaminophen, albumin human, albuterol, alum & mag hydroxide-simeth, camphor-menthol, hydrALAZINE, melatonin, ondansetron (ZOFRAN) IV, mouth rinse, oxyCODONE, polyethylene glycol, sodium chloride flush, traMADol

## 2023-09-21 NOTE — Procedures (Signed)
 Immediately after starting dialysis treatment, this nurse pushed 2000 unit heparin bolus into the arterial line. Blood started leaking from port, air got into line. Treatment stopped. Charge nurse notified. Unable to return blood. Dialyzer and cartridge changed out. Dr. Arlean Hopping notified. Gave okay to give another 2000 unit of heparin bolus at start of dialysis treatment.

## 2023-09-21 NOTE — Progress Notes (Addendum)
 PROGRESS NOTE  Jordan [name]:[id number] DOB: 1951/02/10 DOA: 09/01/2023 PCP: Kennith Center, NP   LOS: 20 days   Brief narrative:  Jordan [name] is an 73 y.o. male with past medical history of hypertension, heart failure with preserved ejection fraction, atrial fibrillation, CAD, COPD with chronic hypoxic respiratory failure on 2 L of oxygen was brought into the hospital from skilled nursing facility with worsening left leg wound.  Patient with history of chronic venous stasis.  Patient stated that he was doing well until he got a new wheelchair last week and the nurses aide tried to put him in bed but lost her balance and he scraped his leg and his skin came off.  In the ED, patient was noted to have increased erythema over the denuded part of the skin.  WBC was elevated at 14, creatinine at 2.4 and blood pressure was 104/60.  Heart rate was 115.  Patient was started on vancomycin and cefepime and was admitted hospital for further evaluation and treatment.    Assessment and plan.  Principal Problem:   Left leg cellulitis Active Problems:   Acute renal failure (ARF) (HCC)   Gross hematuria   Hypotension   Pleural effusion on right   Essential hypertension   HLD (hyperlipidemia)   Chronic venous stasis dermatitis of both lower extremities   COPD (chronic obstructive pulmonary disease) (HCC)   S/P TAVR (transcatheter aortic valve replacement)   Chronic heart failure with preserved ejection fraction (HCC)   Persistent atrial fibrillation (HCC)   Chronic respiratory failure with hypoxia (HCC)  Assessment and Plan: * Left leg cellulitis Status post 10-day Keflex and 5-day course of doxycycline.  Patient also was seen by vascular surgery and underwent arteriogram on 325/25 with no significant peripheral vascular disease.  Erythema has improved.  Will need to follow-up with Dr Lajoyce Corners as outpatient.   Gross hematuria   At this time hemoglobin has remained stable.  Latest  hemoglobin of 7.5 today from 7.2.  Seen by urology and Eliquis has been kept on hold.  CT cystogram of the abdomen and pelvis due to hematuria.   Acute renal failure Seen by nephrology and patient has been started on hemodialysis since yesterday.  Follow nephrology recommendations.  Creatinine today at 4.4 from 6.2 yesterday.  Patient still continues to have nausea and low appetite.  On renal diet with fluid restriction.  Pleural effusion on right Was on 2 L of nasal cannula oxygen.  Will continue to monitor.  Continue volume management with hemodialysis.   Hypotension Metoprolol still on hold due to borderline low blood pressure.   Chronic respiratory failure with hypoxia  Chronically on 2 L/min.  Will continue.  Currently on 2 L of oxygen.   Persistent atrial fibrillation  Hold off with metoprolol for now.  Eliquis on hold as well with hematuria..   Chronic heart failure with preserved ejection fraction Patient had large right pleural effusion on CXR.  Might need thoracocentesis if not improving..  Will repeat chest x-ray in AM.  S/P TAVR (transcatheter aortic valve replacement) Stable.   COPD (chronic obstructive pulmonary disease) Nebulizers as needed.  No no overt wheezing noted.   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 Continue with wound care.  Pressure injury left heel unstageable.  Present on admission.  Continue offloading and wound care if necessary. Pressure Injury 09/02/23 Heel Left Unstageable - Full thickness tissue loss in which the base of the injury is covered by slough (yellow,  tan, gray, green or brown) and/or eschar (tan, brown or black) in the wound bed. (Active)  09/02/23 1713  Location: Heel  Location Orientation: Left  Staging: Unstageable - Full thickness tissue loss in which the base of the injury is covered by slough (yellow, tan, gray, green or brown) and/or eschar (tan, brown or black) in the wound bed.  Wound Description  (Comments):   Present on Admission: Yes     Hyperlipidemia Chronic. On lipitor 80 mg every other day.   Essential hypertension Holding antihypertensives for now.  Debility, deconditioning.  Has been bed bound for 6 months.  Patient is from assisted living.  Will need skilled nursing facility likely on discharge.  DVT prophylaxis: Place and maintain sequential compression device Start: 09/19/23 1109   Disposition: Likely to skilled nursing facility when clinically improved.  Status is: Inpatient Remains inpatient appropriate because: pending clinical improvement, need for hemodialysis, new hemodialysis    Code Status:     Code Status: Full Code  Family Communication:  I spoke with the patient's cousin Mr [name] on the phone and updated him about the clinical condition of the patient.   Consultants: Vascular surgery Nephrology urology   Procedures: 09-12-2023 Abdominal Aortogram Foley catheter placement Right internal jugular triple-lumen catheter on 09/20/2023 by interventional radiology hemodialysis  Anti-infectives:  Rocephin IV  Anti-infectives (From admission, onward)    Start     Dose/Rate Route Frequency Ordered Stop   09/19/23 1200  cefTRIAXone (ROCEPHIN) 2 g in sodium chloride 0.9 % 100 mL IVPB        2 g 200 mL/hr over 30 Minutes Intravenous Every 24 hours 09/19/23 1106 09/22/23 1159   09/08/23 1545  cephALEXin (KEFLEX) capsule 500 mg        500 mg Oral Every 8 hours 09/08/23 1447 09/11/23 0545   09/08/23 1545  doxycycline (VIBRA-TABS) tablet 100 mg        100 mg Oral Every 12 hours 09/08/23 1447 09/12/23 2013   09/04/23 1445  cephALEXin (KEFLEX) capsule 500 mg        500 mg Oral Every 8 hours 09/04/23 1353 09/08/23 0509   09/02/23 1400  vancomycin (VANCOREADY) IVPB 1750 mg/350 mL  Status:  Discontinued        1,750 mg 175 mL/hr over 120 Minutes Intravenous Every 48 hours 09/01/23 1729 09/04/23 1353   09/01/23 1500  vancomycin (VANCOCIN) IVPB 1000 mg/200  mL premix  Status:  Discontinued        1,000 mg 200 mL/hr over 60 Minutes Intravenous  Once 09/01/23 1458 09/01/23 1504   09/01/23 1230  vancomycin (VANCOCIN) IVPB 1000 mg/200 mL premix        1,000 mg 200 mL/hr over 60 Minutes Intravenous  Once 09/01/23 1224 09/01/23 1418   09/01/23 1230  ceFEPIme (MAXIPIME) 2 g in sodium chloride 0.9 % 100 mL IVPB        2 g 200 mL/hr over 30 Minutes Intravenous  Once 09/01/23 1224 09/01/23 1343      Subjective: Today, patient was seen and examined at bedside.  Patient states that he has some nausea today but was able to eat some.  Has not had a bowel movement.  Denies overt chest pain shortness of breath or dyspnea  Objective: Vitals:   09/21/23 0649 09/21/23 0854  BP: 125/75 114/78  Pulse:  (!) 110  Resp:  18  Temp: 97.6 F (36.4 C) 98.6 F (37 C)  SpO2: 100% 97%    Intake/Output Summary (Last  24 hours) at 09/21/2023 1113 Last data filed at 09/21/2023 0600 Gross per 24 hour  Intake 240 ml  Output 2500 ml  Net -2260 ml   Filed Weights   09/17/23 0500 09/18/23 0716 09/19/23 0500  Weight: 102.1 kg 102 kg 103.5 kg   Body mass index is 30.1 kg/m.   Physical Exam: GENERAL: Patient is alert awake and oriented. Not in obvious distress.  Obese build.  Appears weak and deconditioned HENT: No scleral pallor or icterus. Pupils equally reactive to light. Oral mucosa is moist NECK: is supple, no gross swelling noted.  Right internal jugular hemodialysis catheter in place. CHEST:   Diminished breath sounds bilaterally. CVS: S1 and S2 heard, no murmur. Regular rate and rhythm.  ABDOMEN: Soft, non-tender, bowel sounds are present.  Foley catheter in place EXTREMITIES bilateral lower extremity edema with dressing.. CNS: Cranial nerves are intact.  Generalized weakness noted.  Moves all extremities. SKIN: warm and dry, left heel pressure ulceration present on admission  Data Review: I have personally reviewed the following laboratory data and  studies,  CBC: Recent Labs  Lab 09/17/23 0529 09/18/23 0424 09/19/23 0450 09/20/23 1212 09/21/23 0315  WBC 8.8 8.6 7.4 8.4 8.7  HGB 7.2* 7.7* 7.2* 8.0* 7.5*  HCT 25.7* 27.0* 25.7* 27.7* 25.3*  MCV 82.4 82.6 83.4 81.0 81.4  PLT 281 250 228 262 226   Basic Metabolic Panel: Recent Labs  Lab 09/17/23 0530 09/18/23 0424 09/19/23 0450 09/20/23 0355 09/21/23 0315  NA 136 134* 134* 137 133*  133*  K 3.8 3.6 4.1 4.2 3.6  3.7  CL 100 101 100 100 97*  96*  CO2 27 24 23 25 26  26   GLUCOSE 91 91 85 80 98  97  BUN 73* 76* 87* 90* 55*  54*  CREATININE 3.53* 4.16* 4.97* 6.23* 4.40*  4.39*  CALCIUM 7.9* 7.9* 7.8* 8.1* 7.9*  7.8*  MG  --   --   --   --  1.8  PHOS 5.4* 6.1* 7.3* 7.6* 4.4   Liver Function Tests: Recent Labs  Lab 09/17/23 0530 09/18/23 0424 09/19/23 0450 09/20/23 0355 09/21/23 0315  ALBUMIN 2.1* 2.3* 2.2* 2.1* 2.0*   No results for input(s): "LIPASE", "AMYLASE" in the last 168 hours. No results for input(s): "AMMONIA" in the last 168 hours. Cardiac Enzymes: No results for input(s): "CKTOTAL", "CKMB", "CKMBINDEX", "TROPONINI" in the last 168 hours. BNP (last 3 results) No results for input(s): "BNP" in the last 8760 hours.  ProBNP (last 3 results) No results for input(s): "PROBNP" in the last 8760 hours.  CBG: No results for input(s): "GLUCAP" in the last 168 hours. No results found for this or any previous visit (from the past 240 hours).   Studies: IR Fluoro Guide CV Line Right Result Date: 09/20/2023 INDICATION: 73 year old male referred for temporary hemodialysis catheter EXAM: IMAGE GUIDED NON TUNNELED/TEMPORARY HEMODIALYSIS CATHETER MEDICATIONS: None ANESTHESIA/SEDATION: Moderate (conscious) sedation was not employed during this procedure. In FLUOROSCOPY TIME:  Fluoroscopy time: (2.1 MGy). COMPLICATIONS: None None PROCEDURE: formed written consent was obtained from the patient/family after a discussion of the risks, benefits, and alternatives to  treatment. Questions regarding the procedure were encouraged and answered. The right neck was prepped with chlorhexidine in a sterile fashion, and a sterile drape was applied covering the operative field. Maximum barrier sterile technique with sterile gowns and gloves were used for the procedure. A timeout was performed prior to the initiation of the procedure. A micropuncture kit was utilized to access the  right internal jugular vein under direct, real-time ultrasound guidance after the overlying soft tissues were anesthetized with 1% lidocaine with epinephrine. Ultrasound image documentation was performed. The microwire was kinked to measure appropriate catheter length. A stiff glidewire was advanced to the level of the IVC. A 16 cm hemodialysis catheter was then placed over the wire. Wire was removed. This catheter would not function, apparently too short. Wire was then advanced to the IVC and a new 20 cm temporary catheter was placed. Final catheter positioning was confirmed and documented with a spot radiographic image. The 20 cm catheter aspirates and flushes normally. The catheter was flushed with appropriate volume heparin dwells. Dressings were applied. The patient tolerated the procedure well without immediate post procedural complication. IMPRESSION: Status post image guided placement of temporary right IJ triple-lumen hemodialysis catheter. Signed, Yvone Neu. Miachel Roux, RPVI [facility] Electronically Signed   By: Gilmer Mor D.O.   On: 09/20/2023 17:29   IR US Guide Vasc Access Right Result Date: 09/20/2023 INDICATION: 73 year old male referred for temporary hemodialysis catheter EXAM: IMAGE GUIDED NON TUNNELED/TEMPORARY HEMODIALYSIS CATHETER MEDICATIONS: None ANESTHESIA/SEDATION: Moderate (conscious) sedation was not employed during this procedure. In FLUOROSCOPY TIME:  Fluoroscopy time: (2.1 MGy). COMPLICATIONS: None None PROCEDURE:  formed written consent was obtained from the patient/family after a discussion of the risks, benefits, and alternatives to treatment. Questions regarding the procedure were encouraged and answered. The right neck was prepped with chlorhexidine in a sterile fashion, and a sterile drape was applied covering the operative field. Maximum barrier sterile technique with sterile gowns and gloves were used for the procedure. A timeout was performed prior to the initiation of the procedure. A micropuncture kit was utilized to access the right internal jugular vein under direct, real-time ultrasound guidance after the overlying soft tissues were anesthetized with 1% lidocaine with epinephrine. Ultrasound image documentation was performed. The microwire was kinked to measure appropriate catheter length. A stiff glidewire was advanced to the level of the IVC. A 16 cm hemodialysis catheter was then placed over the wire. Wire was removed. This catheter would not function, apparently too short. Wire was then advanced to the IVC and a new 20 cm temporary catheter was placed. Final catheter positioning was confirmed and documented with a spot radiographic image. The 20 cm catheter aspirates and flushes normally. The catheter was flushed with appropriate volume heparin dwells. Dressings were applied. The patient tolerated the procedure well without immediate post procedural complication. IMPRESSION: Status post image guided placement of temporary right IJ triple-lumen hemodialysis catheter. Signed, Yvone Neu. Miachel Roux, RPVI [facility] Electronically Signed   By: Gilmer Mor D.O.   On: 09/20/2023 17:29   US RENAL Result Date: 09/19/2023 CLINICAL DATA:  Acute renal insufficiency. EXAM: RENAL / URINARY TRACT ULTRASOUND COMPLETE COMPARISON:  Renal ultrasound dated 09/06/2023. FINDINGS: Evaluation is limited due to body habitus. Right Kidney: Renal measurements: 10.8 x  4.5 x 5.3 cm = volume: 137 mL. Diffuse increased renal parenchymal echogenicity. No hydronephrosis or shadowing stone. A 12 mm hypoechoic lesion in the interpolar right kidney is poorly evaluated, possibly a cyst. Left Kidney: Renal measurements: 9.7 x 5.3 x 4.6 cm = volume: 123 mL. Diffuse parenchymal echogenicity. No hydronephrosis or shadowing stone. Bladder: The urinary bladder is poorly visualized and predominantly collapsed. Other: None. IMPRESSION: Echogenic kidneys in keeping with medical renal disease. No hydronephrosis or shadowing stone. Electronically Signed   By: Ceasar Mons.D.  On: 09/19/2023 17:27   DG Chest Bilateral Decubitus Result Date: 09/19/2023 CLINICAL DATA:  Pleural effusion. EXAM: CHEST - BILATERAL DECUBITUS VIEW COMPARISON:  Chest CT dated 09/19/2023. FINDINGS: There is cardiomegaly with vascular congestion and edema. Bilateral pleural effusions with associated atelectasis, right greater than left. No pneumothorax. Stable cardiac silhouette. No acute osseous pathology. IMPRESSION: 1. Cardiomegaly with vascular congestion and edema. 2. Bilateral pleural effusions with associated atelectasis, right greater than left. Electronically Signed   By: Elgie Collard M.D.   On: 09/19/2023 15:25      Joycelyn Das, MD  [facility] 09/21/2023  If 7PM-7AM, please contact night-coverage

## 2023-09-22 DIAGNOSIS — L03116 Cellulitis of left lower limb: Secondary | ICD-10-CM | POA: Diagnosis not present

## 2023-09-22 LAB — BASIC METABOLIC PANEL WITH GFR
Anion gap: 10 (ref 5–15)
BUN: 36 mg/dL — ABNORMAL HIGH (ref 8–23)
CO2: 26 mmol/L (ref 22–32)
Calcium: 7.8 mg/dL — ABNORMAL LOW (ref 8.9–10.3)
Chloride: 95 mmol/L — ABNORMAL LOW (ref 98–111)
Creatinine, Ser: 3.53 mg/dL — ABNORMAL HIGH (ref 0.61–1.24)
GFR, Estimated: 17 mL/min — ABNORMAL LOW (ref >60)
Glucose, Bld: 107 mg/dL — ABNORMAL HIGH (ref 70–99)
Potassium: 3.6 mmol/L (ref 3.5–5.1)
Sodium: 131 mmol/L — ABNORMAL LOW (ref 135–145)

## 2023-09-22 LAB — CBC
HCT: 23.9 % — ABNORMAL LOW (ref 39.0–52.0)
Hemoglobin: 7.1 g/dL — ABNORMAL LOW (ref 13.0–17.0)
MCH: 24.3 pg — ABNORMAL LOW (ref 26.0–34.0)
MCHC: 29.7 g/dL — ABNORMAL LOW (ref 30.0–36.0)
MCV: 81.8 fL (ref 80.0–100.0)
Platelets: 266 10*3/uL (ref 150–400)
RBC: 2.92 MIL/uL — ABNORMAL LOW (ref 4.22–5.81)
RDW: 25 % — ABNORMAL HIGH (ref 11.5–15.5)
WBC: 7.6 10*3/uL (ref 4.0–10.5)
nRBC: 0 % (ref 0.0–0.2)

## 2023-09-22 LAB — RENAL FUNCTION PANEL
Albumin: 2 g/dL — ABNORMAL LOW (ref 3.5–5.0)
Anion gap: 11 (ref 5–15)
BUN: 34 mg/dL — ABNORMAL HIGH (ref 8–23)
CO2: 25 mmol/L (ref 22–32)
Calcium: 7.9 mg/dL — ABNORMAL LOW (ref 8.9–10.3)
Chloride: 95 mmol/L — ABNORMAL LOW (ref 98–111)
Creatinine, Ser: 3.63 mg/dL — ABNORMAL HIGH (ref 0.61–1.24)
GFR, Estimated: 17 mL/min — ABNORMAL LOW (ref >60)
Glucose, Bld: 106 mg/dL — ABNORMAL HIGH (ref 70–99)
Phosphorus: 3.7 mg/dL (ref 2.5–4.6)
Potassium: 3.6 mmol/L (ref 3.5–5.1)
Sodium: 131 mmol/L — ABNORMAL LOW (ref 135–145)

## 2023-09-22 LAB — MAGNESIUM: Magnesium: 1.8 mg/dL (ref 1.7–2.4)

## 2023-09-22 MED ORDER — ONDANSETRON 4 MG PO TBDP
4.0000 mg | ORAL_TABLET | Freq: Three times a day (TID) | ORAL | Status: DC | PRN
  Administered 2023-09-22 – 2023-09-29 (×3): 4 mg via ORAL
  Filled 2023-09-22 (×3): qty 1

## 2023-09-22 MED ORDER — GUAIFENESIN 100 MG/5ML PO LIQD: 5.0000 mL | ORAL | Status: DC | PRN

## 2023-09-22 NOTE — Progress Notes (Signed)
 PROGRESS NOTE  [name]:[id number] DOB: 10/21/1950 DOA: 09/01/2023 PCP: Kennith Center, NP   LOS: 21 days   Brief narrative:  Jordan [name] is an 73 y.o. male with past medical history of hypertension, heart failure with preserved ejection fraction, atrial fibrillation, CAD, COPD with chronic hypoxic respiratory failure on 2 L of oxygen was brought into the hospital from skilled nursing facility with worsening left leg wound.  Patient with history of chronic venous stasis.  Patient stated that he was doing well until he got a new wheelchair last week and the nurses aide tried to put him in bed but lost her balance and he scraped his leg and his skin came off.  In the ED, patient was noted to have increased erythema over the denuded part of the skin.  WBC was elevated at 14, creatinine at 2.4 and blood pressure was 104/60.  Heart rate was 115.  Patient was started on vancomycin and cefepime and was admitted hospital for further evaluation and treatment.    Assessment and plan.  Principal Problem:   Left leg cellulitis Active Problems:   Acute renal failure (ARF) (HCC)   Gross hematuria   Hypotension   Pleural effusion on right   Essential hypertension   HLD (hyperlipidemia)   Chronic venous stasis dermatitis of both lower extremities   COPD (chronic obstructive pulmonary disease) (HCC)   S/P TAVR (transcatheter aortic valve replacement)   Chronic heart failure with preserved ejection fraction (HCC)   Persistent atrial fibrillation (HCC)   Chronic respiratory failure with hypoxia (HCC)  Assessment and Plan:  * Left leg cellulitis Status post 10-day Keflex and 5-day course of doxycycline.  Patient also was seen by vascular surgery and underwent arteriogram on 325/25 with no significant peripheral vascular disease. Improving.  Will need to follow-up with Dr Lajoyce Corners as outpatient.   Gross hematuria   At this time hemoglobin has remained stable.  Latest hemoglobin of 7.1  from 7.5.  Seen by urology and Eliquis has been kept on hold.  CT cystogram of the abdomen and pelvis due to hematuria is still pending.  Urine is pinkish today.   Acute renal failure Seen by nephrology and patient has been started on hemodialysis since yesterday.  Follow nephrology recommendations.  Creatinine today 3.3 from at 4.4<6.2 yesterday.  Patient still continues to have nausea and low appetite.  On renal diet with fluid restriction.  Pleural effusion on right On room air today.  Was on nasal cannula.  Will continue to monitor.  Continue volume management with hemodialysis.   Hypotension Metoprolol still on hold due to borderline low blood pressure.   Chronic respiratory failure with hypoxia  Chronically on 2 L/min.  Will continue.  Currently on room air.   Persistent atrial fibrillation  Hold off with metoprolol for now.  Eliquis on hold as well with hematuria..   Chronic heart failure with preserved ejection fraction Patient had large right pleural effusion on CXR.  Might need thoracocentesis if not improving.  Oxygenation seems to be stable.  S/P TAVR (transcatheter aortic valve replacement) Stable.   COPD (chronic obstructive pulmonary disease) Nebulizers as needed.  No no overt wheezing noted.   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 Continue with wound care.  Pressure injury left heel unstageable.  Present on admission.  Continue offloading and wound care if necessary. Pressure Injury 09/02/23 Heel Left Unstageable - Full thickness tissue loss in which the base of the injury is covered  by slough (yellow, tan, gray, green or brown) and/or eschar (tan, brown or black) in the wound bed. (Active)  09/02/23 1713  Location: Heel  Location Orientation: Left  Staging: Unstageable - Full thickness tissue loss in which the base of the injury is covered by slough (yellow, tan, gray, green or brown) and/or eschar (tan, brown or black) in the wound  bed.  Wound Description (Comments):   Present on Admission: Yes     Hyperlipidemia Chronic. On lipitor 80 mg every other day.   Essential hypertension Holding antihypertensives for now.  Debility, deconditioning.  Has been bed bound for 6 months.  Patient is from assisted living.  Will need skilled nursing facility likely on discharge.  DVT prophylaxis: Place and maintain sequential compression device Start: 09/19/23 1109   Disposition: Likely to skilled nursing facility when clinically improved.  Status is: Inpatient Remains inpatient appropriate because: pending clinical improvement, need for hemodialysis, new hemodialysis    Code Status:     Code Status: Full Code  Family Communication:   I spoke with the patient's cousin Mr [name] on the phone and updated him about the clinical condition of the patient.   Consultants: Vascular surgery Nephrology urology   Procedures: 09-12-2023 Abdominal Aortogram Foley catheter placement Right internal jugular triple-lumen catheter on 09/20/2023 by interventional radiology hemodialysis  Anti-infectives:  Rocephin IV  Anti-infectives (From admission, onward)    Start     Dose/Rate Route Frequency Ordered Stop   09/21/23 1800  cefTRIAXone (ROCEPHIN) 2 g in sodium chloride 0.9 % 100 mL IVPB        2 g 200 mL/hr over 30 Minutes Intravenous Every 24 hours 09/21/23 1239 09/22/23 1759   09/19/23 1200  cefTRIAXone (ROCEPHIN) 2 g in sodium chloride 0.9 % 100 mL IVPB        2 g 200 mL/hr over 30 Minutes Intravenous Every 24 hours 09/19/23 1106 09/21/23 2020   09/08/23 1545  cephALEXin (KEFLEX) capsule 500 mg        500 mg Oral Every 8 hours 09/08/23 1447 09/11/23 0545   09/08/23 1545  doxycycline (VIBRA-TABS) tablet 100 mg        100 mg Oral Every 12 hours 09/08/23 1447 09/12/23 2013   09/04/23 1445  cephALEXin (KEFLEX) capsule 500 mg        500 mg Oral Every 8 hours 09/04/23 1353 09/08/23 0509   09/02/23 1400  vancomycin  (VANCOREADY) IVPB 1750 mg/350 mL  Status:  Discontinued        1,750 mg 175 mL/hr over 120 Minutes Intravenous Every 48 hours 09/01/23 1729 09/04/23 1353   09/01/23 1500  vancomycin (VANCOCIN) IVPB 1000 mg/200 mL premix  Status:  Discontinued        1,000 mg 200 mL/hr over 60 Minutes Intravenous  Once 09/01/23 1458 09/01/23 1504   09/01/23 1230  vancomycin (VANCOCIN) IVPB 1000 mg/200 mL premix        1,000 mg 200 mL/hr over 60 Minutes Intravenous  Once 09/01/23 1224 09/01/23 1418   09/01/23 1230  ceFEPIme (MAXIPIME) 2 g in sodium chloride 0.9 % 100 mL IVPB        2 g 200 mL/hr over 30 Minutes Intravenous  Once 09/01/23 1224 09/01/23 1343      Subjective:  Today, patient was seen and examined at bedside.  Patient complains of mild nausea but has not had a bowel movement.  Foley catheter with pinkish urine. Objective: Vitals:   09/22/23 0541 09/22/23 0720  BP: 100/70  113/67  Pulse: (!) 108 (!) 108  Resp: 18 18  Temp: 97.9 F (36.6 C) 98 F (36.7 C)  SpO2: 98% 98%    Intake/Output Summary (Last 24 hours) at 09/22/2023 1039 Last data filed at 09/21/2023 2224 Gross per 24 hour  Intake 474 ml  Output 3450 ml  Net -2976 ml   Filed Weights   09/21/23 1421 09/21/23 1820 09/22/23 0715  Weight: 110.2 kg 108.2 kg 101.9 kg   Body mass index is 29.64 kg/m.   Physical Exam:  GENERAL: Patient is alert awake and oriented. Not in obvious distress.  Obese build.  Appears weak and deconditioned, on room air HENT: No scleral pallor or icterus. Pupils equally reactive to light. Oral mucosa is moist NECK: is supple, no gross swelling noted.  Right internal jugular hemodialysis catheter in place. CHEST:   Diminished breath sounds bilaterally. CVS: S1 and S2 heard, no murmur. Regular rate and rhythm.  ABDOMEN: Soft, non-tender, bowel sounds are present.  Foley catheter in place EXTREMITIES bilateral lower extremity edema with dressing.. CNS: Cranial nerves are intact.  Generalized weakness  noted.  Moves all extremities. SKIN: warm and dry, left heel pressure ulceration present on admission  Data Review: I have personally reviewed the following laboratory data and studies,  CBC: Recent Labs  Lab 09/18/23 0424 09/19/23 0450 09/20/23 1212 09/21/23 0315 09/22/23 0340  WBC 8.6 7.4 8.4 8.7 7.6  HGB 7.7* 7.2* 8.0* 7.5* 7.1*  HCT 27.0* 25.7* 27.7* 25.3* 23.9*  MCV 82.6 83.4 81.0 81.4 81.8  PLT 250 228 262 226 266   Basic Metabolic Panel: Recent Labs  Lab 09/18/23 0424 09/19/23 0450 09/20/23 0355 09/21/23 0315 09/22/23 0340  NA 134* 134* 137 133*  133* 131*  131*  K 3.6 4.1 4.2 3.6  3.7 3.6  3.6  CL 101 100 100 97*  96* 95*  95*  CO2 24 23 25 26  26 26  25   GLUCOSE 91 85 80 98  97 107*  106*  BUN 76* 87* 90* 55*  54* 36*  34*  CREATININE 4.16* 4.97* 6.23* 4.40*  4.39* 3.53*  3.63*  CALCIUM 7.9* 7.8* 8.1* 7.9*  7.8* 7.8*  7.9*  MG  --   --   --  1.8 1.8  PHOS 6.1* 7.3* 7.6* 4.4 3.7   Liver Function Tests: Recent Labs  Lab 09/18/23 0424 09/19/23 0450 09/20/23 0355 09/21/23 0315 09/22/23 0340  ALBUMIN 2.3* 2.2* 2.1* 2.0* 2.0*   No results for input(s): "LIPASE", "AMYLASE" in the last 168 hours. No results for input(s): "AMMONIA" in the last 168 hours. Cardiac Enzymes: No results for input(s): "CKTOTAL", "CKMB", "CKMBINDEX", "TROPONINI" in the last 168 hours. BNP (last 3 results) No results for input(s): "BNP" in the last 8760 hours.  ProBNP (last 3 results) No results for input(s): "PROBNP" in the last 8760 hours.  CBG: No results for input(s): "GLUCAP" in the last 168 hours. No results found for this or any previous visit (from the past 240 hours).   Studies: IR Fluoro Guide CV Line Right Result Date: 09/20/2023 INDICATION: 73 year old male referred for temporary hemodialysis catheter EXAM: IMAGE GUIDED NON TUNNELED/TEMPORARY HEMODIALYSIS CATHETER MEDICATIONS: None ANESTHESIA/SEDATION: Moderate (conscious) sedation was not employed  during this procedure. In FLUOROSCOPY TIME:  Fluoroscopy time: (2.1 MGy). COMPLICATIONS: None None PROCEDURE: formed written consent was obtained from the patient/family after a discussion of the risks, benefits, and alternatives to treatment. Questions regarding the procedure were encouraged and answered. The right neck was prepped  with chlorhexidine in a sterile fashion, and a sterile drape was applied covering the operative field. Maximum barrier sterile technique with sterile gowns and gloves were used for the procedure. A timeout was performed prior to the initiation of the procedure. A micropuncture kit was utilized to access the right internal jugular vein under direct, real-time ultrasound guidance after the overlying soft tissues were anesthetized with 1% lidocaine with epinephrine. Ultrasound image documentation was performed. The microwire was kinked to measure appropriate catheter length. A stiff glidewire was advanced to the level of the IVC. A 16 cm hemodialysis catheter was then placed over the wire. Wire was removed. This catheter would not function, apparently too short. Wire was then advanced to the IVC and a new 20 cm temporary catheter was placed. Final catheter positioning was confirmed and documented with a spot radiographic image. The 20 cm catheter aspirates and flushes normally. The catheter was flushed with appropriate volume heparin dwells. Dressings were applied. The patient tolerated the procedure well without immediate post procedural complication. IMPRESSION: Status post image guided placement of temporary right IJ triple-lumen hemodialysis catheter. Signed, Yvone Neu. Miachel Roux, RPVI [facility] Electronically Signed   By: Gilmer Mor D.O.   On: 09/20/2023 17:29   IR US Guide Vasc Access Right Result Date: 09/20/2023 INDICATION: 73 year old male referred for temporary hemodialysis catheter EXAM: IMAGE GUIDED NON  TUNNELED/TEMPORARY HEMODIALYSIS CATHETER MEDICATIONS: None ANESTHESIA/SEDATION: Moderate (conscious) sedation was not employed during this procedure. In FLUOROSCOPY TIME:  Fluoroscopy time: (2.1 MGy). COMPLICATIONS: None None PROCEDURE: formed written consent was obtained from the patient/family after a discussion of the risks, benefits, and alternatives to treatment. Questions regarding the procedure were encouraged and answered. The right neck was prepped with chlorhexidine in a sterile fashion, and a sterile drape was applied covering the operative field. Maximum barrier sterile technique with sterile gowns and gloves were used for the procedure. A timeout was performed prior to the initiation of the procedure. A micropuncture kit was utilized to access the right internal jugular vein under direct, real-time ultrasound guidance after the overlying soft tissues were anesthetized with 1% lidocaine with epinephrine. Ultrasound image documentation was performed. The microwire was kinked to measure appropriate catheter length. A stiff glidewire was advanced to the level of the IVC. A 16 cm hemodialysis catheter was then placed over the wire. Wire was removed. This catheter would not function, apparently too short. Wire was then advanced to the IVC and a new 20 cm temporary catheter was placed. Final catheter positioning was confirmed and documented with a spot radiographic image. The 20 cm catheter aspirates and flushes normally. The catheter was flushed with appropriate volume heparin dwells. Dressings were applied. The patient tolerated the procedure well without immediate post procedural complication. IMPRESSION: Status post image guided placement of temporary right IJ triple-lumen hemodialysis catheter. Signed, Yvone Neu. Miachel Roux, RPVI [facility] Radiology Electronically Signed   By: Gilmer Mor D.O.   On: 09/20/2023 17:29      Joycelyn Das,  MD  [facility] 09/22/2023  If 7PM-7AM, please contact night-coverage

## 2023-09-22 NOTE — Progress Notes (Signed)
 DISCHARGE NOTE HOME Jordan [name] to be discharged Skilled nursing facility per MD order. Discussed prescriptions and follow up appointments with the patient. Prescriptions given to [facility] staff, medication list explained in detail. Patient verbalized understanding.  Skin clean, dry and intact without evidence of skin break down, no evidence of skin tears noted. IV catheter discontinued intact. Site without signs and symptoms of complications. Dressing and pressure applied. Pt denies pain at the site currently. No complaints noted.  Patient free of lines, drains, and wounds.   An After Visit Summary (AVS) was printed and given to the patient. Patient escorted via wheelchair, and discharged home via [facility].  Margarita Grizzle, RN

## 2023-09-22 NOTE — Progress Notes (Signed)
 [facility] Progress Note  Subjective:  2nd HD yest w/ 3 L off  Feels "wiped out" after 2nd HD  Vitals:   09/21/23 2224 09/22/23 0541 09/22/23 0715 09/22/23 0720  BP: 98/69 100/70  113/67  Pulse: (!) 103 (!) 108  (!) 108  Resp:  18  18  Temp: 98.7 F (37.1 C) 97.9 F (36.6 C)  98 F (36.7 C)  TempSrc: Oral Oral  Oral  SpO2: 97% 98%  98%  Weight:   101.9 kg   Height:        Exam: Gen: NAD, [place] O2 CVS: tachy Resp: CTA Abd: +BS, soft, NT/ND Ext: 2+ bilat hip edema  Assessment/ Plan: AKI: b/l creat is 1.3, from PCP's office in Jan 2024. Assuming this is AKI but could also have CKD. UA shows some rbc's/ wbc's, no proteinuria. Renal US w/o obstruction. BP's were soft in 90s on presentation and then improved after IV abx and IVF's, but pt developed pulm edema on CXR #2 w/ bilat LE edema. Suspect AKI due to CHF/ cardiorenal vs ATN vs other. Started IV lasix 3/19 and titrated up the dose. Diuresed well x several days then BP dropped 3/23 so Lasix was decreased to 80 mg IV tid, then stopped 3/24.  UOP dropped but Scr stable. A renal CO2 angiogram was negative for renal artery stenosis. Then lasix po was started w/ increased UOP but unfortunately the Scr rose up to 3 then 4 then 6.  Repeat renal US showed no hydro, exam and CXR showed persistent fluid overload. IR placed temp cath 4/02. Pt was started on iHD 4/2, and got his 2nd HD yesterday. Next HD Saturday. Cont to lower vol as well.  Volume - diffuse LE edema and +CHF by all CXR's. Volume overload improving on exam so far, wt's are coming down.  BP - bp's soft, metoprolol was stopped. Tolerating UF w/ HD, can add midodrine if needed.  Gross hematuria: appreciate urology assistance.  Anemia - hgb down to 6.7.  Received blood transfusion this wknd.  LLE cellulitis: SP IV abx, is now on po keflex COPD Atrial fib - metoprolol dc'd due to soft BP's  Chronic resp failure - on home O2 2 L S/p TAVR Disposition - came from  assisted living at [facility]. Would benefit from PT input and palliative care input given his apparently mod-severe deconditioning, will d/w pmd.   Jordan Moselle  MD  [facility] 09/21/2023, 10:04 AM  Recent Labs  Lab 09/20/23 0355 09/20/23 1212 09/21/23 0315  HGB  --  8.0* 7.5*  ALBUMIN 2.1*  --  2.0*  CALCIUM 8.1*  --  7.9*  7.8*  PHOS 7.6*  --  4.4  CREATININE 6.23*  --  4.40*  4.39*  K 4.2  --  3.6  3.7    Inpatient medications:  atorvastatin  80 mg Oral QODAY   Chlorhexidine Gluconate Cloth  6 each Topical Q0600   Chlorhexidine Gluconate Cloth  6 each Topical Q0600   LORazepam  1 mg Oral BID   mometasone-formoterol  2 puff Inhalation BID   multivitamin with minerals  1 tablet Oral Q breakfast   pantoprazole  40 mg Oral Daily   sevelamer carbonate  800 mg Oral TID WC   sodium chloride flush  3 mL Intravenous Q12H   venlafaxine XR  75 mg Oral Q breakfast    albumin human     cefTRIAXone (ROCEPHIN)  IV 2 g (09/20/23 1311)   acetaminophen **OR** acetaminophen,  acetaminophen, albumin human, albuterol, alum & mag hydroxide-simeth, camphor-menthol, hydrALAZINE, melatonin, ondansetron (ZOFRAN) IV, mouth rinse, oxyCODONE, polyethylene glycol, sodium chloride flush, traMADol

## 2023-09-22 NOTE — Plan of Care (Signed)
  Problem: Education: Goal: Knowledge of General Education information will improve Description: Including pain rating scale, medication(s)/side effects and non-pharmacologic comfort measures Outcome: Progressing   Problem: Health Behavior/Discharge Planning: Goal: Ability to manage health-related needs will improve Outcome: Progressing   Problem: Clinical Measurements: Goal: Ability to maintain clinical measurements within normal limits will improve Outcome: Progressing Goal: Will remain free from infection Outcome: Progressing Goal: Diagnostic test results will improve Outcome: Progressing Goal: Respiratory complications will improve Outcome: Progressing Goal: Cardiovascular complication will be avoided Outcome: Progressing   Problem: Activity: Goal: Risk for activity intolerance will decrease Outcome: Progressing   Problem: Nutrition: Goal: Adequate nutrition will be maintained Outcome: Progressing   Problem: Coping: Goal: Level of anxiety will decrease Outcome: Progressing   Problem: Elimination: Goal: Will not experience complications related to bowel motility Outcome: Progressing Goal: Will not experience complications related to urinary retention Outcome: Progressing   Problem: Pain Managment: Goal: General experience of comfort will improve and/or be controlled Outcome: Progressing   Problem: Safety: Goal: Ability to remain free from injury will improve Outcome: Progressing   Problem: Skin Integrity: Goal: Risk for impaired skin integrity will decrease Outcome: Progressing   Problem: Clinical Measurements: Goal: Ability to avoid or minimize complications of infection will improve Outcome: Progressing   Problem: Skin Integrity: Goal: Skin integrity will improve Outcome: Progressing   Problem: Education: Goal: Understanding of CV disease, CV risk reduction, and recovery process will improve Outcome: Progressing Goal: Individualized Educational  Video(s) Outcome: Progressing   Problem: Activity: Goal: Ability to return to baseline activity level will improve Outcome: Progressing   Problem: Cardiovascular: Goal: Ability to achieve and maintain adequate cardiovascular perfusion will improve Outcome: Progressing Goal: Vascular access site(s) Level 0-1 will be maintained Outcome: Progressing   Problem: Health Behavior/Discharge Planning: Goal: Ability to safely manage health-related needs after discharge will improve Outcome: Progressing

## 2023-09-22 NOTE — Progress Notes (Signed)
 Physical Therapy Treatment Patient Details Name: ONDRA DEBOARD MRN: [id number] DOB: Feb 11, 1951 Today's Date: 09/22/2023   History of Present Illness 73 yo male admitted with soft tissue infection/L LE wound, L toe laceration after sustaining a fall at ALF while be transferred back to bed per pt report. Hx of TAVR, bil LE edema, R TKA 07/2021, DM, Afib, anemia, anxiety, CHF, COPD-on O2 PRN per pt, obesity, aortic stenosis, venous insufficiency    PT Comments  Pt seen for PT tx with pt agreeable to tx with max encouragement as pt reports significant fatigue. Pt declined OOB mobility but does participate in BLE strengthening exercises with AAROM & cuing for technique. Pt would benefit from ongoing PT services to progress mobility able.    If plan is discharge home, recommend the following: Assistance with cooking/housework;Assist for transportation;Help with stairs or ramp for entrance;Two people to help with walking and/or transfers;Two people to help with bathing/dressing/bathroom   Can travel by private vehicle     No  Equipment Recommendations  None recommended by PT    Recommendations for Other Services       Precautions / Restrictions Precautions Precautions: Fall;Other (comment) Precaution/Restrictions Comments: multiple LE wounds-tender to touch; chronic 2L O2 at baseline Restrictions Weight Bearing Restrictions Per Provider Order: No     Mobility  Bed Mobility                    Transfers                        Ambulation/Gait                   Stairs             Wheelchair Mobility     Tilt Bed    Modified Rankin (Stroke Patients Only)       Balance                                            Communication Communication Communication: No apparent difficulties  Cognition Arousal: Alert Behavior During Therapy: WFL for tasks assessed/performed   PT - Cognitive impairments: No apparent impairments                          Following commands: Intact      Cueing Cueing Techniques: Verbal cues  Exercises General Exercises - Lower Extremity Heel Slides: AAROM, Strengthening, Both, 10 reps, Supine Hip ABduction/ADduction: Supine, AAROM, Strengthening, Both, 5 reps    General Comments        Pertinent Vitals/Pain Pain Assessment Pain Assessment: Faces Faces Pain Scale: Hurts little more Pain Location: R low back, BLE Pain Descriptors / Indicators: Grimacing, Discomfort Pain Intervention(s): Monitored during session, Repositioned, Limited activity within patient's tolerance    Home Living                          Prior Function            PT Goals (current goals can now be found in the care plan section) Acute Rehab PT Goals Patient Stated Goal: return to ALF PT Goal Formulation: With patient Time For Goal Achievement: 10/06/23 Potential to Achieve Goals: Fair Progress towards PT goals: Progressing toward goals    Frequency    Min  2X/week      PT Plan      Co-evaluation              AM-PAC PT "6 Clicks" Mobility   Outcome Measure  Help needed turning from your back to your side while in a flat bed without using bedrails?: A Lot Help needed moving from lying on your back to sitting on the side of a flat bed without using bedrails?: Total Help needed moving to and from a bed to a chair (including a wheelchair)?: Total Help needed standing up from a chair using your arms (e.g., wheelchair or bedside chair)?: Total Help needed to walk in hospital room?: Total Help needed climbing 3-5 steps with a railing? : Total 6 Click Score: 7    End of Session Equipment Utilized During Treatment: Oxygen Activity Tolerance: Patient limited by fatigue Patient left: in bed;with call bell/phone within reach;with bed alarm set Nurse Communication: Mobility status PT Visit Diagnosis: Muscle weakness (generalized) (M62.81);History of falling  (Z91.81);Other abnormalities of gait and mobility (R26.89)     Time: 1610-9604 PT Time Calculation (min) (ACUTE ONLY): 10 min  Charges:    $Therapeutic Activity: 8-22 mins PT General Charges $$ ACUTE PT VISIT: 1 Visit                     Aleda Grana, PT, DPT 09/22/23, 10:38 AM   Sandi Mariscal 09/22/2023, 10:37 AM

## 2023-09-23 DIAGNOSIS — L03116 Cellulitis of left lower limb: Secondary | ICD-10-CM | POA: Diagnosis not present

## 2023-09-23 LAB — RENAL FUNCTION PANEL
Albumin: 2 g/dL — ABNORMAL LOW (ref 3.5–5.0)
Anion gap: 10 (ref 5–15)
BUN: 44 mg/dL — ABNORMAL HIGH (ref 8–23)
CO2: 27 mmol/L (ref 22–32)
Calcium: 8.2 mg/dL — ABNORMAL LOW (ref 8.9–10.3)
Chloride: 94 mmol/L — ABNORMAL LOW (ref 98–111)
Creatinine, Ser: 5.31 mg/dL — ABNORMAL HIGH (ref 0.61–1.24)
GFR, Estimated: 11 mL/min — ABNORMAL LOW (ref >60)
Glucose, Bld: 98 mg/dL (ref 70–99)
Phosphorus: 4.6 mg/dL (ref 2.5–4.6)
Potassium: 3.8 mmol/L (ref 3.5–5.1)
Sodium: 131 mmol/L — ABNORMAL LOW (ref 135–145)

## 2023-09-23 LAB — CBC
HCT: 23.4 % — ABNORMAL LOW (ref 39.0–52.0)
Hemoglobin: 6.9 g/dL — CL (ref 13.0–17.0)
MCH: 24.1 pg — ABNORMAL LOW (ref 26.0–34.0)
MCHC: 29.5 g/dL — ABNORMAL LOW (ref 30.0–36.0)
MCV: 81.8 fL (ref 80.0–100.0)
Platelets: 207 10*3/uL (ref 150–400)
RBC: 2.86 MIL/uL — ABNORMAL LOW (ref 4.22–5.81)
RDW: 25.3 % — ABNORMAL HIGH (ref 11.5–15.5)
WBC: 7.8 10*3/uL (ref 4.0–10.5)
nRBC: 0 % (ref 0.0–0.2)

## 2023-09-23 LAB — MAGNESIUM: Magnesium: 1.9 mg/dL (ref 1.7–2.4)

## 2023-09-23 LAB — PREPARE RBC (CROSSMATCH)

## 2023-09-23 MED ORDER — HEPARIN SODIUM (PORCINE) 1000 UNIT/ML IJ SOLN
2000.0000 [IU] | Freq: Once | INTRAMUSCULAR | Status: AC
  Administered 2023-09-23: 2000 [IU] via INTRAVENOUS

## 2023-09-23 MED ORDER — SODIUM CHLORIDE 0.9% IV SOLUTION: Freq: Once | INTRAVENOUS | Status: AC

## 2023-09-23 MED ORDER — METOPROLOL TARTRATE 25 MG PO TABS
25.0000 mg | ORAL_TABLET | Freq: Once | ORAL | Status: AC
  Administered 2023-09-23: 25 mg via ORAL
  Filled 2023-09-23: qty 1

## 2023-09-23 MED ORDER — HEPARIN SODIUM (PORCINE) 1000 UNIT/ML IJ SOLN
INTRAMUSCULAR | Status: AC
  Filled 2023-09-23: qty 3

## 2023-09-23 MED ORDER — METOPROLOL TARTRATE 25 MG PO TABS
25.0000 mg | ORAL_TABLET | Freq: Two times a day (BID) | ORAL | Status: DC
  Administered 2023-09-24 – 2023-10-01 (×15): 25 mg via ORAL
  Filled 2023-09-23 (×15): qty 1

## 2023-09-23 MED ORDER — CHLORHEXIDINE GLUCONATE CLOTH 2 % EX PADS
6.0000 | MEDICATED_PAD | Freq: Every day | CUTANEOUS | Status: DC
Start: 2023-09-23 — End: 2023-09-23

## 2023-09-23 MED ORDER — NEPRO/CARBSTEADY PO LIQD
237.0000 mL | Freq: Two times a day (BID) | ORAL | Status: DC
  Administered 2023-09-23 – 2023-10-01 (×8): 237 mL via ORAL

## 2023-09-23 MED ORDER — HEPARIN SODIUM (PORCINE) 1000 UNIT/ML IJ SOLN
2800.0000 [IU] | Freq: Once | INTRAMUSCULAR | Status: AC
  Administered 2023-09-23: 2800 [IU]

## 2023-09-23 MED ORDER — METOPROLOL TARTRATE 5 MG/5ML IV SOLN
5.0000 mg | Freq: Once | INTRAVENOUS | Status: AC
  Administered 2023-09-23: 5 mg via INTRAVENOUS
  Filled 2023-09-23: qty 5

## 2023-09-23 MED ORDER — HEPARIN SODIUM (PORCINE) 1000 UNIT/ML IJ SOLN
INTRAMUSCULAR | Status: AC
Start: 2023-09-23 — End: 2023-09-24
  Filled 2023-09-23: qty 2

## 2023-09-23 NOTE — Progress Notes (Signed)
 PROGRESS NOTE  Jordan [name]:[id number] DOB: November 21, 1950 DOA: 09/01/2023 PCP: Kennith Center, NP   LOS: 22 days   Brief narrative:  Jordan [name] is an 73 y.o. male with past medical history of hypertension, heart failure with preserved ejection fraction, atrial fibrillation, CAD, COPD with chronic hypoxic respiratory failure on 2 L of oxygen was brought into the hospital from skilled nursing facility with worsening left leg wound.  Patient with history of chronic venous stasis.  Patient stated that he was doing well until he got a new wheelchair last week and the nurses aide tried to put him in bed but lost her balance and he scraped his leg and his skin came off.  In the ED, patient was noted to have increased erythema over the denuded part of the skin.  WBC was elevated at 14, creatinine at 2.4 and blood pressure was 104/60.  Heart rate was 115.  Patient was started on vancomycin and cefepime and was admitted hospital for further evaluation and treatment.    Assessment and plan.  Principal Problem:   Left leg cellulitis Active Problems:   Acute renal failure (ARF) (HCC)   Gross hematuria   Hypotension   Pleural effusion on right   Essential hypertension   HLD (hyperlipidemia)   Chronic venous stasis dermatitis of both lower extremities   COPD (chronic obstructive pulmonary disease) (HCC)   S/P TAVR (transcatheter aortic valve replacement)   Chronic heart failure with preserved ejection fraction (HCC)   Persistent atrial fibrillation (HCC)   Chronic respiratory failure with hypoxia (HCC)  Assessment and Plan:  * Left leg cellulitis Status post 10-day Keflex and 5-day course of doxycycline.  Patient also was seen by vascular surgery and underwent arteriogram on 325/25 with no significant peripheral vascular disease. Improving.  Patient will need to follow-up with  Dr Jordan [name] as outpatient.   Gross hematuria Increased hematuria still noted.  Hemoglobin down to 6.9 today  from 7.1 yesterday.   Seen by urology and Eliquis has been kept on hold.  CT cystogram of the abdomen and pelvis due to hematuria is still pending.  Will transfuse 1 unit of packed RBC today.  Acute blood loss anemia secondary to hematuria.  Will transfuse 1 unit of packed RBC.  Check CBC in AM.   Acute renal failure Seen by nephrology and patient has been started on hemodialysis during hospitalization.  Follow nephrology recommendations.  Creatinine today 5.3 today.  Patient still continues to have nausea and diarrhea has slowed down some.  On renal diet with fluid restriction.  Pleural effusion on right On 3 L of oxygen.  Continue hemodialysis.   Hypotension Metoprolol still on hold due to borderline low blood pressure.   Chronic respiratory failure with hypoxia  Chronically on 2 L/min.  Will continue.  Currently on room air.   Persistent atrial fibrillation  Will restart metoprolol.  Eliquis on hold as well with hematuria..   Chronic heart failure with preserved ejection fraction Patient had large right pleural effusion on CXR.  Might need thoracocentesis if not improving.  Oxygenation seems to be stable.  Denies any increasing dyspnea.  Continue volume management as per hemodialysis.  S/P TAVR (transcatheter aortic valve replacement) Stable.   COPD (chronic obstructive pulmonary disease) Nebulizers as needed.  No no overt wheezing noted.   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 Continue with wound care.  Pressure injury left heel unstageable.  Present on admission.  Continue offloading and wound care  if necessary. Pressure Injury 09/02/23 Heel Left Unstageable - Full thickness tissue loss in which the base of the injury is covered by slough (yellow, tan, gray, green or brown) and/or eschar (tan, brown or black) in the wound bed. (Active)  09/02/23 1713  Location: Heel  Location Orientation: Left  Staging: Unstageable - Full thickness tissue loss in  which the base of the injury is covered by slough (yellow, tan, gray, green or brown) and/or eschar (tan, brown or black) in the wound bed.  Wound Description (Comments):   Present on Admission: Yes     Hyperlipidemia Chronic. On lipitor 80 mg every other day.   Essential hypertension Holding antihypertensives for now.  Debility, deconditioning.  Has been bed bound for 6 months.  Patient is from assisted living.  Will need skilled nursing facility likely on discharge.  DVT prophylaxis: Place and maintain sequential compression device Start: 09/19/23 1109   Disposition: Likely to skilled nursing facility when clinically improved and okay with nephrology.  Status is: Inpatient Remains inpatient appropriate because: pending clinical improvement, need for hemodialysis, new hemodialysis    Code Status:     Code Status: Full Code  Family Communication:   I spoke with the patient's cousin [name] on the phone on 09/21/2023  Consultants: Vascular surgery Nephrology urology   Procedures: 09-12-2023 Abdominal Aortogram Foley catheter placement Right internal jugular triple-lumen catheter on 09/20/2023 by interventional radiology hemodialysis  Anti-infectives:  Rocephin IV-completed   Subjective:  Today, patient was seen and examined at bedside.  Patient states that his shortness of breath has improved including his diarrhea.  Denies chest pain, fever, chills or rigor.  Objective: Vitals:   09/23/23 0529 09/23/23 0833  BP: 112/80 104/73  Pulse: 85 82  Resp: (!) 22 20  Temp: (!) 97.4 F (36.3 C) 97.8 F (36.6 C)  SpO2: (!) 77% 95%    Intake/Output Summary (Last 24 hours) at 09/23/2023 0924 Last data filed at 09/23/2023 1610 Gross per 24 hour  Intake 360 ml  Output 400 ml  Net -40 ml   Filed Weights   09/21/23 1820 09/22/23 0715 09/23/23 0322  Weight: 108.2 kg 101.9 kg 101.6 kg   Body mass index is 29.55 kg/m.   Physical Exam:  GENERAL: Patient is alert awake  and oriented. Not in obvious distress.  Obese build.  Appears weak and deconditioned, on r nasal cannula oxygen HENT: No scleral pallor or icterus. Pupils equally reactive to light. Oral mucosa is moist NECK: is supple, no gross swelling noted.  Right internal jugular hemodialysis catheter in place. CHEST:   Diminished breath sounds bilaterally. CVS: S1 and S2 heard, no murmur.  Irregularly irregular rhythm with tachycardia ABDOMEN: Soft, non-tender, bowel sounds are present.  Foley catheter in place with pinkish urine EXTREMITIES bilateral lower extremity edema  CNS: Cranial nerves are intact.  Generalized weakness noted.  Moves all extremities. SKIN: warm and dry, left heel pressure ulceration present on admission  Data Review: I have personally reviewed the following laboratory data and studies,  CBC: Recent Labs  Lab 09/19/23 0450 09/20/23 1212 09/21/23 0315 09/22/23 0340 09/23/23 0237  WBC 7.4 8.4 8.7 7.6 7.8  HGB 7.2* 8.0* 7.5* 7.1* 6.9*  HCT 25.7* 27.7* 25.3* 23.9* 23.4*  MCV 83.4 81.0 81.4 81.8 81.8  PLT 228 262 226 266 207   Basic Metabolic Panel: Recent Labs  Lab 09/19/23 0450 09/20/23 0355 09/21/23 0315 09/22/23 0340 09/23/23 0237  NA 134* 137 133*  133* 131*  131* 131*  K 4.1 4.2 3.6  3.7 3.6  3.6 3.8  CL 100 100 97*  96* 95*  95* 94*  CO2 23 25 26  26 26  25 27   GLUCOSE 85 80 98  97 107*  106* 98  BUN 87* 90* 55*  54* 36*  34* 44*  CREATININE 4.97* 6.23* 4.40*  4.39* 3.53*  3.63* 5.31*  CALCIUM 7.8* 8.1* 7.9*  7.8* 7.8*  7.9* 8.2*  MG  --   --  1.8 1.8 1.9  PHOS 7.3* 7.6* 4.4 3.7 4.6   Liver Function Tests: Recent Labs  Lab 09/19/23 0450 09/20/23 0355 09/21/23 0315 09/22/23 0340 09/23/23 0237  ALBUMIN 2.2* 2.1* 2.0* 2.0* 2.0*   No results for input(s): "LIPASE", "AMYLASE" in the last 168 hours. No results for input(s): "AMMONIA" in the last 168 hours. Cardiac Enzymes: No results for input(s): "CKTOTAL", "CKMB", "CKMBINDEX",  "TROPONINI" in the last 168 hours. BNP (last 3 results) No results for input(s): "BNP" in the last 8760 hours.  ProBNP (last 3 results) No results for input(s): "PROBNP" in the last 8760 hours.  CBG: No results for input(s): "GLUCAP" in the last 168 hours. No results found for this or any previous visit (from the past 240 hours).   Studies: No results found.     Joycelyn Das, MD  [facility] 09/23/2023  If 7PM-7AM, please contact night-coverage

## 2023-09-23 NOTE — Plan of Care (Signed)
  Problem: Health Behavior/Discharge Planning: Goal: Ability to manage health-related needs will improve Outcome: Progressing   Problem: Clinical Measurements: Goal: Ability to maintain clinical measurements within normal limits will improve Outcome: Progressing Goal: Will remain free from infection Outcome: Progressing Goal: Diagnostic test results will improve Outcome: Progressing Goal: Respiratory complications will improve Outcome: Progressing Goal: Cardiovascular complication will be avoided Outcome: Progressing   Problem: Activity: Goal: Risk for activity intolerance will decrease Outcome: Progressing   Problem: Nutrition: Goal: Adequate nutrition will be maintained Outcome: Progressing   Problem: Coping: Goal: Level of anxiety will decrease Outcome: Progressing   Problem: Elimination: Goal: Will not experience complications related to bowel motility Outcome: Progressing Goal: Will not experience complications related to urinary retention Outcome: Progressing   Problem: Pain Managment: Goal: General experience of comfort will improve and/or be controlled Outcome: Progressing   Problem: Safety: Goal: Ability to remain free from injury will improve Outcome: Progressing   Problem: Skin Integrity: Goal: Risk for impaired skin integrity will decrease Outcome: Progressing   Problem: Clinical Measurements: Goal: Ability to avoid or minimize complications of infection will improve Outcome: Progressing   Problem: Skin Integrity: Goal: Skin integrity will improve Outcome: Progressing   Problem: Education: Goal: Understanding of CV disease, CV risk reduction, and recovery process will improve Outcome: Progressing Goal: Individualized Educational Video(s) Outcome: Progressing   Problem: Activity: Goal: Ability to return to baseline activity level will improve Outcome: Progressing   Problem: Cardiovascular: Goal: Ability to achieve and maintain adequate  cardiovascular perfusion will improve Outcome: Progressing Goal: Vascular access site(s) Level 0-1 will be maintained Outcome: Progressing   Problem: Health Behavior/Discharge Planning: Goal: Ability to safely manage health-related needs after discharge will improve Outcome: Progressing

## 2023-09-23 NOTE — Progress Notes (Signed)
 Urology Inpatient Progress Report  Soft tissue infection [L08.9] Cellulitis of left lower extremity [L03.116]  Procedure(s): ABDOMINAL AORTOGRAM Lower Extremity Angiography  11 Days Post-Op   Intv/Subj: Hemoglobin 6.9.  Receiving a blood transfusion.  Urine remains thin, light red  Principal Problem:   Left leg cellulitis Active Problems:   Essential hypertension   HLD (hyperlipidemia)   Chronic venous stasis dermatitis of both lower extremities   COPD (chronic obstructive pulmonary disease) (HCC)   S/P TAVR (transcatheter aortic valve replacement)   Chronic heart failure with preserved ejection fraction (HCC)   Persistent atrial fibrillation (HCC)   Chronic respiratory failure with hypoxia (HCC)   Acute renal failure (ARF) (HCC)   Gross hematuria   Hypotension   Pleural effusion on right  Current Facility-Administered Medications  Medication Dose Route Frequency Provider Last Rate Last Admin   acetaminophen (TYLENOL) tablet 650 mg  650 mg Oral Q6H PRN Pieter Partridge, MD   650 mg at 09/08/23 2100   Or   acetaminophen (TYLENOL) suppository 650 mg  650 mg Rectal Q6H PRN Pieter Partridge, MD       acetaminophen (TYLENOL) tablet 650 mg  650 mg Oral Q4H PRN Nada Libman, MD       albumin human 25 % solution 25 g  25 g Intravenous Q1H PRN Delano Metz, MD       albuterol (PROVENTIL) (2.5 MG/3ML) 0.083% nebulizer solution 2.5 mg  2.5 mg Nebulization Q6H PRN Regalado, Belkys A, MD       alum & mag hydroxide-simeth (MAALOX/MYLANTA) 200-200-20 MG/5ML suspension 30 mL  30 mL Oral Q6H PRN Carollee Herter, DO   30 mL at 09/22/23 1903   atorvastatin (LIPITOR) tablet 80 mg  80 mg Oral Corinna Capra Tublu, MD   80 mg at 09/23/23 0857   camphor-menthol (SARNA) lotion   Topical PRN Luiz Iron, NP       Chlorhexidine Gluconate Cloth 2 % PADS 6 each  6 each Topical Q0600 Delano Metz, MD   6 each at 09/22/23 0516   Chlorhexidine Gluconate Cloth 2 %  PADS 6 each  6 each Topical Q0600 Delano Metz, MD   6 each at 09/23/23 0648   feeding supplement (NEPRO CARB STEADY) liquid 237 mL  237 mL Oral BID BM Pokhrel, Laxman, MD   237 mL at 09/23/23 0900   guaiFENesin (ROBITUSSIN) 100 MG/5ML liquid 5 mL  5 mL Oral Q4H PRN Opyd, Lavone Neri, MD       hydrALAZINE (APRESOLINE) injection 5 mg  5 mg Intravenous Q20 Min PRN Nada Libman, MD       LORazepam (ATIVAN) tablet 1 mg  1 mg Oral BID Leandro Reasoner Tublu, MD   1 mg at 09/23/23 0857   melatonin tablet 5 mg  5 mg Oral QHS PRN Leandro Reasoner Tublu, MD   5 mg at 09/22/23 2157   mometasone-formoterol (DULERA) 200-5 MCG/ACT inhaler 2 puff  2 puff Inhalation BID Pieter Partridge, MD   2 puff at 09/23/23 1610   multivitamin with minerals tablet 1 tablet  1 tablet Oral Q breakfast Leandro Reasoner Tublu, MD   1 tablet at 09/23/23 0857   ondansetron (ZOFRAN-ODT) disintegrating tablet 4 mg  4 mg Oral Q8H PRN Opyd, Lavone Neri, MD   4 mg at 09/22/23 1955   Oral care mouth rinse  15 mL Mouth Rinse PRN Pokhrel, Laxman, MD       oxyCODONE (Oxy IR/ROXICODONE) immediate release tablet 5 mg  5 mg Oral BID PRN Leeroy Bock, MD   5 mg at 09/20/23 2350   pantoprazole (PROTONIX) EC tablet 40 mg  40 mg Oral Daily Leandro Reasoner Tublu, MD   40 mg at 09/23/23 0857   polyethylene glycol (MIRALAX / GLYCOLAX) packet 17 g  17 g Oral Daily PRN Pieter Partridge, MD       sevelamer carbonate (RENVELA) tablet 800 mg  800 mg Oral TID WC Tobey Grim, MD   800 mg at 09/23/23 0857   sodium chloride flush (NS) 0.9 % injection 3 mL  3 mL Intravenous Q12H Nada Libman, MD   3 mL at 09/23/23 0857   sodium chloride flush (NS) 0.9 % injection 3 mL  3 mL Intravenous PRN Nada Libman, MD       traMADol Janean Sark) tablet 50 mg  50 mg Oral BID PRN Pieter Partridge, MD   50 mg at 09/13/23 2038   venlafaxine XR (EFFEXOR-XR) 24 hr capsule 75 mg  75 mg Oral Q breakfast Leandro Reasoner Tublu, MD   75 mg at 09/23/23 0857     Objective: Vital: Vitals:   09/23/23 0833 09/23/23 0934 09/23/23 0950 09/23/23 1140  BP: 104/73 115/70 109/68 122/76  Pulse: 82 (!) 117 (!) 108 93  Resp: 20 18 18 18   Temp: 97.8 F (36.6 C) 97.7 F (36.5 C) 98.2 F (36.8 C) 98.1 F (36.7 C)  TempSrc: Oral Oral Oral Oral  SpO2: 95% 99% 98% 99%  Weight:      Height:       I/Os: I/O last 3 completed shifts: In: 834 [P.O.:834] Out: 450 [Urine:450]  Physical Exam:  General: Patient is in no apparent distress Lungs: Normal respiratory effort, chest expands symmetrically. GI:  The abdomen is soft and nontender without mass. Foley: Thin light red urine Ext: lower extremities symmetric  Lab Results: Recent Labs    09/21/23 0315 09/22/23 0340 09/23/23 0237  WBC 8.7 7.6 7.8  HGB 7.5* 7.1* 6.9*  HCT 25.3* 23.9* 23.4*   Recent Labs    09/21/23 0315 09/22/23 0340 09/23/23 0237  NA 133*  133* 131*  131* 131*  K 3.6  3.7 3.6  3.6 3.8  CL 97*  96* 95*  95* 94*  CO2 26  26 26  25 27   GLUCOSE 98  97 107*  106* 98  BUN 55*  54* 36*  34* 44*  CREATININE 4.40*  4.39* 3.53*  3.63* 5.31*  CALCIUM 7.9*  7.8* 7.8*  7.9* 8.2*   No results for input(s): "LABPT", "INR" in the last 72 hours. No results for input(s): "LABURIN" in the last 72 hours. Results for orders placed or performed during the hospital encounter of 09/01/23  Culture, blood (routine x 2)     Status: None   Collection Time: 09/01/23 12:25 PM   Specimen: BLOOD LEFT ARM  Result Value Ref Range Status   Specimen Description   Final    BLOOD LEFT ARM Performed at [facility], [address] [place], [place] [address]    Special Requests   Final    BOTTLES DRAWN AEROBIC AND ANAEROBIC Blood Culture adequate volume Performed at [facility], [address], [place], [place] [address]    Culture   Final    NO GROWTH 5 DAYS Performed at [facility], [address], [place], [place] [address]    Report Status 09/06/2023 FINAL  Final  Culture,  blood (routine x 2)     Status: None   Collection Time: 09/01/23  1:18 PM   Specimen: BLOOD RIGHT FOREARM  Result Value Ref Range Status   Specimen Description   Final    BLOOD RIGHT FOREARM Performed at [facility], [address], [place], [place] [address]    Special Requests   Final    BOTTLES DRAWN AEROBIC AND ANAEROBIC Blood Culture adequate volume Performed at [facility], [address], [place], [place] [address]    Culture   Final    NO GROWTH 5 DAYS Performed at [facility], [address], [place], [place] [address]    Report Status 09/06/2023 FINAL  Final  Resp panel by RT-PCR (RSV, Flu A&B, Covid) Anterior Nasal Swab     Status: None   Collection Time: 09/01/23  1:18 PM   Specimen: Anterior Nasal Swab  Result Value Ref Range Status   SARS Coronavirus 2 by RT PCR NEGATIVE NEGATIVE Final    Comment: (NOTE) SARS-CoV-2 target nucleic acids are NOT DETECTED.  The SARS-CoV-2 RNA is generally detectable in upper respiratory specimens during the acute phase of infection. The lowest concentration of SARS-CoV-2 viral copies this assay can detect is 138 copies/mL. A negative result does not preclude SARS-Cov-2 infection and should not be used as the sole basis for treatment or other patient management decisions. A negative result may occur with  improper specimen collection/handling, submission of specimen other than nasopharyngeal swab, presence of viral mutation(s) within the areas targeted by this assay, and inadequate number of viral copies(<138 copies/mL). A negative result must be combined with clinical observations, patient history, and epidemiological information. The expected result is Negative.  Fact Sheet for Patients:  BloggerCourse.com  Fact Sheet for Healthcare Providers:  SeriousBroker.it  This  test is no t yet approved or cleared by the [place] FDA and  has been authorized for detection and/or diagnosis of SARS-CoV-2 by FDA under an Emergency Use Authorization (EUA). This EUA will remain  in effect (meaning this test can be used) for the duration of the COVID-19 declaration under Section 564(b)(1) of the Act, 21 U.S.C.section 360bbb-3(b)(1), unless the authorization is terminated  or revoked sooner.       Influenza A by PCR NEGATIVE NEGATIVE Final   Influenza B by PCR NEGATIVE NEGATIVE Final    Comment: (NOTE) The Xpert Xpress SARS-CoV-2/FLU/RSV plus assay is intended as an aid in the diagnosis of influenza from Nasopharyngeal swab specimens and should not be used as a sole basis for treatment. Nasal washings and aspirates are unacceptable for Xpert Xpress SARS-CoV-2/FLU/RSV testing.  Fact Sheet for Patients: BloggerCourse.com  Fact Sheet for Healthcare Providers: SeriousBroker.it  This test is not yet approved or cleared by the [place] FDA and has been authorized for detection and/or diagnosis of SARS-CoV-2 by FDA under an Emergency Use Authorization (EUA). This EUA will remain in effect (meaning this test can be used) for the duration of the COVID-19 declaration under Section 564(b)(1) of the Act, 21 U.S.C. section 360bbb-3(b)(1), unless the authorization is terminated or revoked.     Resp Syncytial Virus by PCR NEGATIVE NEGATIVE Final    Comment: (NOTE) Fact Sheet for Patients: BloggerCourse.com  Fact Sheet for Healthcare Providers: SeriousBroker.it  This test is not yet approved or cleared by the [place] FDA and has been authorized for detection and/or diagnosis of SARS-CoV-2 by FDA under an Emergency Use Authorization (EUA). This EUA will remain in effect (  meaning this test can be used) for the duration of the COVID-19 declaration under  Section 564(b)(1) of the Act, 21 U.S.C. section 360bbb-3(b)(1), unless the authorization is terminated or revoked.  Performed at [facility], [address] [place], [place] [address]   MRSA Next Gen by PCR, Nasal     Status: None   Collection Time: 09/02/23 11:45 AM   Specimen: Nasal Mucosa; Nasal Swab  Result Value Ref Range Status   MRSA by PCR Next Gen NOT DETECTED NOT DETECTED Final    Comment: (NOTE) The GeneXpert MRSA Assay (FDA approved for NASAL specimens only), is one component of a comprehensive MRSA colonization surveillance program. It is not intended to diagnose MRSA infection nor to guide or monitor treatment for MRSA infections. Test performance is not FDA approved in patients less than 82 years old. Performed at [facility], [address], [place], [place] [address]     Studies/Results: No results found.  Assessment: Gross hematuria  Plan: Continue to monitor.  Continue holding blood thinner.  Gross hematuria does not appear to be significant at this point.  Continue catheter   Modena Slater, MD Urology 09/23/2023, 11:51 AM

## 2023-09-23 NOTE — Progress Notes (Signed)
 Hgb is down to 6.9 this morning. Plan to transfuse 1 unit RBCs.

## 2023-09-23 NOTE — Progress Notes (Signed)
 [facility] Progress Note  Subjective:  Seen in room, his aunt is visiting Pt feels better today 3rd HD later today  Vitals:   09/23/23 1235 09/23/23 1237 09/23/23 1241 09/23/23 1300  BP:  126/70  120/78  Pulse:  (!) 112 (!) 120 (!) 147  Resp:  18 20 14   Temp:  97.6 F (36.4 C)    TempSrc:      SpO2:  98% 100% 100%  Weight: 101 kg 101 kg    Height:        Exam: Gen: NAD, [place] O2 CVS: tachy Resp: CTA Abd: +BS, soft, NT/ND Ext: still has 1- 2+ bilat hip edema > pretib edema, also UE edema  Assessment/ Plan: AKI: b/l creat is 1.3, from PCP's office in Jan 2024. Assuming this is AKI but could also have CKD. UA shows some rbc's/ wbc's, no proteinuria. Renal US w/o obstruction. BP's were soft in 90s on presentation and then improved after IV abx and IVF's, but pt developed pulm edema on CXR #2 w/ bilat LE edema. Suspect AKI due to CHF/ cardiorenal. Started IV lasix 3/19 and titrated up the dose. Diuresed well x several days then BP dropped 3/23 so Lasix was decreased to 80 mg IV tid, then stopped 3/24.  UOP dropped but Scr stable. A renal CO2 angiogram was negative for renal artery stenosis. Then lasix po was started w/ increased UOP but unfortunately the Scr rose up to 3 then 4 then 6.  Repeat renal US showed no hydro, exam and CXR showed persistent fluid overload. IR placed temp cath 4/02. Pt was started on iHD 4/2, and got his 2nd HD 4/3. Next HD today. Cont to lower volume.  Volume - diffuse LE edema and +CHF on all CXR's. Volume overload improving on exam so far, wt's are coming down. Cont to lower vol w/ HD.  BP - bp's soft, metoprolol was stopped. Tolerating UF w/ HD.  Gross hematuria: appreciate urology assistance.  Anemia - hgb down to 6.7. PRBC's ordered for today.  LLE cellulitis: SP IV abx then keflex, now off abx Atrial fib - metoprolol dc'd due to soft BP's  COPD/ chronic resp failure - on home O2 2 L S/p TAVR Disposition - came from assisted living at [facility]. Would benefit from PT input given what looks like mod-severe deconditioning.   Jordan Moselle  MD  [facility] 09/21/2023, 10:04 AM  Recent Labs  Lab 09/20/23 0355 09/20/23 1212 09/21/23 0315  HGB  --  8.0* 7.5*  ALBUMIN 2.1*  --  2.0*  CALCIUM 8.1*  --  7.9*  7.8*  PHOS 7.6*  --  4.4  CREATININE 6.23*  --  4.40*  4.39*  K 4.2  --  3.6  3.7    Inpatient medications:  atorvastatin  80 mg Oral QODAY   Chlorhexidine Gluconate Cloth  6 each Topical Q0600   Chlorhexidine Gluconate Cloth  6 each Topical Q0600   LORazepam  1 mg Oral BID   mometasone-formoterol  2 puff Inhalation BID   multivitamin with minerals  1 tablet Oral Q breakfast   pantoprazole  40 mg Oral Daily   sevelamer carbonate  800 mg Oral TID WC   sodium chloride flush  3 mL Intravenous Q12H   venlafaxine XR  75 mg Oral Q breakfast    albumin human     cefTRIAXone (ROCEPHIN)  IV 2 g (09/20/23 1311)   acetaminophen **OR** acetaminophen, acetaminophen, albumin human, albuterol, alum & mag hydroxide-simeth,  camphor-menthol, hydrALAZINE, melatonin, ondansetron (ZOFRAN) IV, mouth rinse, oxyCODONE, polyethylene glycol, sodium chloride flush, traMADol

## 2023-09-24 ENCOUNTER — Inpatient Hospital Stay (HOSPITAL_COMMUNITY)

## 2023-09-24 DIAGNOSIS — E785 Hyperlipidemia, unspecified: Secondary | ICD-10-CM

## 2023-09-24 DIAGNOSIS — G4733 Obstructive sleep apnea (adult) (pediatric): Secondary | ICD-10-CM

## 2023-09-24 DIAGNOSIS — L03116 Cellulitis of left lower limb: Secondary | ICD-10-CM | POA: Diagnosis not present

## 2023-09-24 DIAGNOSIS — Z66 Do not resuscitate: Secondary | ICD-10-CM | POA: Diagnosis not present

## 2023-09-24 DIAGNOSIS — Z7189 Other specified counseling: Secondary | ICD-10-CM | POA: Diagnosis not present

## 2023-09-24 DIAGNOSIS — I5030 Unspecified diastolic (congestive) heart failure: Secondary | ICD-10-CM

## 2023-09-24 DIAGNOSIS — N179 Acute kidney failure, unspecified: Secondary | ICD-10-CM | POA: Diagnosis not present

## 2023-09-24 DIAGNOSIS — I4819 Other persistent atrial fibrillation: Secondary | ICD-10-CM | POA: Diagnosis not present

## 2023-09-24 DIAGNOSIS — Z952 Presence of prosthetic heart valve: Secondary | ICD-10-CM | POA: Diagnosis not present

## 2023-09-24 DIAGNOSIS — Z515 Encounter for palliative care: Secondary | ICD-10-CM | POA: Diagnosis not present

## 2023-09-24 LAB — TYPE AND SCREEN
ABO/RH(D): O POS
Antibody Screen: NEGATIVE
Unit division: 0

## 2023-09-24 LAB — RENAL FUNCTION PANEL
Albumin: 2.1 g/dL — ABNORMAL LOW (ref 3.5–5.0)
Anion gap: 11 (ref 5–15)
BUN: 30 mg/dL — ABNORMAL HIGH (ref 8–23)
CO2: 25 mmol/L (ref 22–32)
Calcium: 8.1 mg/dL — ABNORMAL LOW (ref 8.9–10.3)
Chloride: 94 mmol/L — ABNORMAL LOW (ref 98–111)
Creatinine, Ser: 3.91 mg/dL — ABNORMAL HIGH (ref 0.61–1.24)
GFR, Estimated: 15 mL/min — ABNORMAL LOW (ref >60)
Glucose, Bld: 101 mg/dL — ABNORMAL HIGH (ref 70–99)
Phosphorus: 3.3 mg/dL (ref 2.5–4.6)
Potassium: 3.5 mmol/L (ref 3.5–5.1)
Sodium: 130 mmol/L — ABNORMAL LOW (ref 135–145)

## 2023-09-24 LAB — BASIC METABOLIC PANEL WITH GFR
Anion gap: 10 (ref 5–15)
BUN: 30 mg/dL — ABNORMAL HIGH (ref 8–23)
CO2: 25 mmol/L (ref 22–32)
Calcium: 8.1 mg/dL — ABNORMAL LOW (ref 8.9–10.3)
Chloride: 95 mmol/L — ABNORMAL LOW (ref 98–111)
Creatinine, Ser: 3.91 mg/dL — ABNORMAL HIGH (ref 0.61–1.24)
GFR, Estimated: 15 mL/min — ABNORMAL LOW (ref >60)
Glucose, Bld: 102 mg/dL — ABNORMAL HIGH (ref 70–99)
Potassium: 3.5 mmol/L (ref 3.5–5.1)
Sodium: 130 mmol/L — ABNORMAL LOW (ref 135–145)

## 2023-09-24 LAB — CBC
HCT: 27.1 % — ABNORMAL LOW (ref 39.0–52.0)
Hemoglobin: 7.9 g/dL — ABNORMAL LOW (ref 13.0–17.0)
MCH: 24.2 pg — ABNORMAL LOW (ref 26.0–34.0)
MCHC: 29.2 g/dL — ABNORMAL LOW (ref 30.0–36.0)
MCV: 82.9 fL (ref 80.0–100.0)
Platelets: 279 10*3/uL (ref 150–400)
RBC: 3.27 MIL/uL — ABNORMAL LOW (ref 4.22–5.81)
RDW: 24.2 % — ABNORMAL HIGH (ref 11.5–15.5)
WBC: 8.8 10*3/uL (ref 4.0–10.5)
nRBC: 0 % (ref 0.0–0.2)

## 2023-09-24 LAB — BPAM RBC
Blood Product Expiration Date: 202504292359
ISSUE DATE / TIME: 202504050924
Unit Type and Rh: 5100

## 2023-09-24 LAB — MAGNESIUM: Magnesium: 1.9 mg/dL (ref 1.7–2.4)

## 2023-09-24 NOTE — Consult Note (Signed)
 Palliative Medicine Inpatient Consult Note  Consulting Provider:  Delano Metz, MD   Reason for consult:   Palliative Care Consult Services Palliative Medicine Consult  Reason for Consult? please see for GOC, pleasant elderly man w/ AKI on CKD, started on HD this week and improving in short-term (vol overload improving) but not sure if he can tolerate chronic outpatient dialysis (mobility very limitied), thanks   09/24/2023  HPI:  Per intake H&P --> Jordan [name] is an 74 y.o. male with past medical history of hypertension, heart failure with preserved ejection fraction, atrial fibrillation, CAD, COPD with chronic hypoxic respiratory failure on 2 L of oxygen was brought into the hospital from skilled nursing facility with worsening left leg wound. Palliative care has been asked to support additional goals of care conversations in the setting of recent initiation of hemodialysis.   Clinical Assessment/Goals of Care:  *Please note that this is a verbal dictation therefore any spelling or grammatical errors are due to the "Dragon Medical One" system interpretation.  I have reviewed medical records including EPIC notes, labs and imaging, received report from bedside RN, assessed the patient who is lying in bed in no acute distress. He is at times disoriented though this would not be as noticeable if not having a long conversation.    I met with Jordan [name] to further discuss diagnosis prognosis, GOC, EOL wishes, disposition and options.   I introduced Palliative Medicine as specialized medical care for people living with serious illness. It focuses on providing relief from the symptoms and stress of a serious illness. The goal is to improve quality of life for both the patient and the family.  Medical History Review and Understanding:  A review of Jordan [name]'s past medical history significant for COPD, heart failure, A-fib, hypertension, renal failure, hyperlipidemia, and chronic  bilateral lower extremity venous stasis was completed.  Social History:  Jordan [name] is lived in [place] throughout his life.  He shares he never got married nor had children.  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  He shares he used to lift a fish, boat, and go on family trips to the beach.  Functional and Nutritional State:  Preceding hospitalization, Jordan [name] was living at [facility] doing very well he notes an acute event occurred whereby he feels he was "dropped".  Since then his health has been deteriorating.  Jordan [name] still does have a fairly decent appetite.  Advance Directives:  A detailed discussion was had today regarding advanced directives.  Child shares that he thinks he has advanced directives and on them his cousin(s) Acupuncturist and Loraine Leriche are his Merchant navy officer.  Code Status:  Concepts specific to code status, artifical feeding and hydration, continued IV antibiotics and rehospitalization was had.  The difference between a aggressive medical intervention path  and a palliative comfort care path for this patient at this time was had.   Encouraged patient/family to consider DNR/DNI status understanding evidenced based poor outcomes in similar hospitalized patient, as the cause of arrest is likely associated with advanced chronic/terminal illness rather than an easily reversible acute cardio-pulmonary event. I explained that DNR/DNI does not change the medical plan and it only comes into effect after a person has arrested (died).  It is a protective measure to keep [place]  from harming the patient in their last moments of life. Jordan [name] was agreeable to DNR/DNI with understanding that patient would not receive CPR, defibrillation, ACLS  medications, or intubation.   Discussion:  Jordan [name] and I reviewed that he feels he was very well-functioning preceding his fall.  He was able to assist with B ADLs-slowly.  He notes that he was able to attend to some of his IADLs also.   Jamarea and I discussed his prolonged 23 hospital day course.  We discussed the complications associated with hospitalization and the recent initiation of hemodialysis.  We discussed the pros and cons of dialysis in the long run I shared openly and honestly that this can contribute to worsening weakness and fatigue.  We reviewed for Hazem to get even close to baseline it would take extensive rehabilitation which per physical assessment seems like would be a tremendous feat.  Brayn notes that he would want to try irregardless.  Jordan [name] is agreeable to having a family meeting to further discuss goals of care  Discussed the importance of continued conversation with family and their  medical providers regarding overall plan of care and treatment options, ensuring decisions are within the context of the patients values and GOCs. _____________________________ Addendum:  I spoke to patient's cousin, Jordan [name] regarding the above conversation.  I did share with Jordan [name] that it seems drawls is alert at times but more disoriented at times.  I shared the importance of [place] all speaking together and clarified CODE STATUS with him over the phone also.  Jordan [name] and his family plan to meet with me later this afternoon for further discussions regarding where we go from here should Jordan [name] remain to deteriorate. _____________________________ Addendum #2:  I met with Jordan [name], his cousin(s) Jordan [name], Jordan [name], and aunt, Jordan [name]. We reviewed patients current clinical state.  I confirmed that patient was receiving care and the staff at [facility] attempted to move him with a lift chair which he slipped out of causing leg lacerations and bleeding. Jordan [name] was not sent to the hospital until  later though.   We discussed Yousef long term concerns in the setting of his generalized weakness. I shared the worry about his ability to tolerate long term dialysis. I shared if he is unable to tolerate dialysis or if he worsens then options from there would be limited to hospice.   I described hospice as a service for patients who have a life expectancy of 6 months or less. The goal of hospice is the preservation of dignity and quality at the end phases of life. Under hospice care, the focus changes from curative to symptom relief.   Patient and his family understand.  We discussed advanced directive completion. They were reviewing this further.   I completed a [name] form today. The patient and family outlined their wishes for the following treatment decisions:  Cardiopulmonary Resuscitation: Do Not Attempt Resuscitation (DNR/No CPR)  Medical Interventions: Limited Additional Interventions: Use medical treatment, IV fluids and cardiac monitoring as indicated, DO NOT USE intubation or mechanical ventilation. May consider use of less invasive airway support such as BiPAP or CPAP. Also provide comfort measures. Transfer to the hospital if indicated. Avoid intensive care.   Antibiotics: Determine use of limitation of antibiotics when infection occurs  IV Fluids: IV fluids for a defined trial period  Feeding Tube: No feeding tube   At this point plan to allow time for outcomes.   Decision Maker: Jordan [name] (cousin): [phone]  SUMMARY OF RECOMMENDATIONS   DNAR/DNI  [name] / DNR Form Completed, paper copy placed onto the chart electric copy can be found in [employer]  Reviewed advanced directives with patient and his family - plan to complete these while he is hospitalized  Open and honest conversations held in the setting of Jordan [name]'s acute on chronic medical conditions  Ongoing palliative care support  Code Status/Advance Care Planning: DNAR/DNI   Palliative Prophylaxis:   Aspiration, Bowel Regimen, Delirium Protocol, Frequent Pain Assessment, Oral Care, Palliative Wound Care, and Turn Reposition  Additional Recommendations (Limitations, Scope, Preferences): Continue current care  Psycho-social/Spiritual:  Desire for further Chaplaincy support: Yes-patient is Methodist Additional Recommendations: Education on chronic disease burden   Prognosis: Limited in the setting of profound muscular deconditioning, chronic comorbidity burden.  Patient is at higher 61-month mortality risk  Discharge Planning: Discharge plan to be determined  Vitals:   09/24/23 0538 09/24/23 0806  BP: 97/62 109/69  Pulse: (!) 113   Resp: 19 20  Temp: 97.7 F (36.5 C) 98.4 F (36.9 C)  SpO2: 92% 98%    Intake/Output Summary (Last 24 hours) at 09/24/2023 1350 Last data filed at 09/24/2023 0600 Gross per 24 hour  Intake 420 ml  Output 252.2 ml  Net 167.8 ml   Last Weight  [name] recent update: 09/23/2023  3:59 PM    Weight  98.1 kg (216 lb 4.3 oz)            Gen: Elderly Caucasian male chronically ill in appearance HEENT: moist mucous membranes CV: Irregular rate and rhythm PULM: On 2 L nasal cannula breathing is even and unlabored ABD: soft/nontender EXT: Generalized edema Neuro: Alert and oriented to self and place though at times disoriented  PPS: 20%  This conversation/these recommendations were discussed with patient primary care team, Dr. Tyson Babinski  Total Time: 120 Billing based on MDM: High Problems Addressed: One acute or chronic illness or injury that poses a threat to life or bodily function Amount and/or Complexity of Data: Category 2:Independent interpretation of a test performed by another physician/other qualified health care professional (not separately reported) and Category 3:Discussion of management or test interpretation with external physician/other qualified health care professional/appropriate source (not separately reported) Risks: Decision  regarding hospitalization or escalation of hospital care and Decision not to resuscitate or to de-escalate care because of poor prognosis ______________________________________________________ [facility] Palliative Medicine Team Team Cell Phone: [phone] Please utilize secure chat with additional questions, if there is no response within 30 minutes please call the above phone number  Palliative Medicine Team providers are available by phone from 7am to 7pm daily and can be reached through the team cell phone.  Should this patient require assistance outside of these hours, please call the patient's attending physician.

## 2023-09-24 NOTE — Progress Notes (Addendum)
 PROGRESS NOTE  Jordan [name] ZOX:[id number] DOB: 11/16/1950 DOA: 09/01/2023 PCP: Kennith Center, NP   LOS: 23 days   Brief narrative:  Jordan [name] is an 73 y.o. male with past medical history of hypertension, heart failure with preserved ejection fraction, atrial fibrillation, CAD, COPD with chronic hypoxic respiratory failure on 2 L of oxygen was brought into the hospital from skilled nursing facility with worsening left leg wound.  Patient with history of chronic venous stasis.  Patient stated that he was doing well until he got a new wheelchair last week and the nurses aide tried to put him in bed but lost her balance and he scraped his leg and his skin came off.  In the ED, patient was noted to have increased erythema over the denuded part of the skin.  WBC was elevated at 14, creatinine at 2.4 and blood pressure was 104/60.  Heart rate was 115.  Patient was started on vancomycin and cefepime and was admitted hospital for further evaluation and treatment.    Assessment and plan.  Principal Problem:   Left leg cellulitis Active Problems:   Acute renal failure (ARF) (HCC)   Gross hematuria   Hypotension   Pleural effusion on right   Essential hypertension   HLD (hyperlipidemia)   Chronic venous stasis dermatitis of both lower extremities   COPD (chronic obstructive pulmonary disease) (HCC)   S/P TAVR (transcatheter aortic valve replacement)   Chronic heart failure with preserved ejection fraction (HCC)   Persistent atrial fibrillation (HCC)   Chronic respiratory failure with hypoxia (HCC)  Assessment and Plan:  * Atrial fibrillation with RVR, history of persistent atrial fibrillation  Restarted on metoprolol yesterday but blood pressure is borderline low.  Eliquis currently on hold due to hematuria.  Has had RVR and since there is concern for possible cardiorenal syndrome with borderline low blood pressure will consult cardiology.  Have spoken with Dr Myrle Sheng to cardiology  for consultation.  Received albumin currently on metoprolol 25 twice daily.   Acute renal failure Seen by nephrology and patient has been started on hemodialysis during hospitalization.  Creatinine today at 3.9 from 3.9 from 5.3 yesterday.  Nephrology following the patient and at this time neurology has discussed the patient regarding palliative care.  Left leg cellulitis Status post 10-day Keflex and 5-day course of doxycycline.  Patient also was seen by vascular surgery and underwent arteriogram on 325/25 with no significant peripheral vascular disease. Improving.  Patient will need to follow-up with  Dr Lajoyce Corners as outpatient.  Hematuria Mild pinkish urine still noted.  Hemoglobin down has improved to 7.9 after blood transfusion for hemoglobin of 6.9.   Seen by urology and Eliquis has been kept on hold.  CT cystogram of the abdomen and pelvis due to hematuria is still pending.    Acute blood loss anemia secondary to hematuria.  Received 1 unit of packed RBC.  Hemoglobin today at 7.9.  Will transfuse for hemoglobin less than 7.   Leural effusion on right On 3 L of oxygen.  Continue hemodialysis.   Hypotension Started on metoprolol borderline low blood pressure.  Will get cardiology opinion.   Chronic respiratory failure with hypoxia  Chronically on 2 L/min.  Will continue.      Chronic heart failure with preserved ejection fraction Patient had large right pleural effusion on CXR.  Continue volume management as per hemodialysis.  S/P TAVR (transcatheter aortic valve replacement) Stable.   COPD (chronic obstructive pulmonary disease) Nebulizers as needed.  No no overt wheezing noted.   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 Continue with wound care.  Pressure injury left heel unstageable.  Present on admission.  Continue offloading and wound care if necessary. Pressure Injury 09/02/23 Heel Left Unstageable - Full thickness tissue loss in which the base of  the injury is covered by slough (yellow, tan, gray, green or brown) and/or eschar (tan, brown or black) in the wound bed. (Active)  09/02/23 1713  Location: Heel  Location Orientation: Left  Staging: Unstageable - Full thickness tissue loss in which the base of the injury is covered by slough (yellow, tan, gray, green or brown) and/or eschar (tan, brown or black) in the wound bed.  Wound Description (Comments):   Present on Admission: Yes     Hyperlipidemia Chronic. On lipitor 80 mg every other day.   Essential hypertension Holding antihypertensives for now.  Debility, deconditioning.  Has been bed bound for 6 months.  Patient is from assisted living.  Will need skilled nursing facility likely on discharge.  DVT prophylaxis: Place and maintain sequential compression device Start: 09/19/23 1109   Disposition: Likely to skilled nursing facility when clinically improved and okay with nephrology.  Status is: Inpatient Remains inpatient appropriate because: pending clinical improvement, need for hemodialysis, new hemodialysis, A-fib with RVR,   Code Status:     Code Status: Full Code  Family Communication:   I spoke with the patient's aunt on the phone on 09/24/2023 and updated her about the clinical condition of the patient.  Consultants: Vascular surgery Nephrology urology  Cardiology Palliative care  Procedures:  Abdominal Aortogram on 09-12-2023 Foley catheter placement Right internal jugular triple-lumen catheter on 09/20/2023 by interventional radiology hemodialysis  Anti-infectives:  Rocephin IV-completed  Subjective:  Today, patient was seen and examined at bedside.  Patient denies any pain, nausea, vomiting, dizziness, lightheadedness but complains of tiredness.  Mild shortness of breath no chest pain.  Nursing staff reported RVR with borderline low blood pressure.  Objective: Vitals:   09/24/23 0538 09/24/23 0806  BP: 97/62 109/69  Pulse: (!) 113   Resp: 19  20  Temp: 97.7 F (36.5 C) 98.4 F (36.9 C)  SpO2: 92% 98%    Intake/Output Summary (Last 24 hours) at 09/24/2023 1005 Last data filed at 09/24/2023 0600 Gross per 24 hour  Intake 717 ml  Output 252.2 ml  Net 464.8 ml   Filed Weights   09/23/23 1235 09/23/23 1237 09/23/23 1558  Weight: 101 kg 101 kg 98.1 kg   Body mass index is 28.53 kg/m.   Physical Exam:  GENERAL: Patient is alert awake and oriented. Not in obvious distress.  Obese build.  Appears weak and deconditioned, on nasal cannula oxygen HENT: No scleral pallor or icterus. Pupils equally reactive to light. Oral mucosa is moist NECK: is supple, no gross swelling noted.  Right internal jugular hemodialysis catheter in place. CHEST:   Diminished breath sounds bilaterally. CVS: S1 and S2 heard, no murmur.  Irregularly irregular rhythm with tachycardia ABDOMEN: Soft, non-tender, bowel sounds are present.  Foley catheter in place with pinkish urine EXTREMITIES bilateral lower extremity edema  CNS: Cranial nerves are intact.  Generalized weakness noted.  Moves all extremities. SKIN: warm and dry, left heel pressure ulceration present on admission  Data Review: I have personally reviewed the following laboratory data and studies,  CBC: Recent Labs  Lab 09/20/23 1212 09/21/23 0315 09/22/23 0340 09/23/23 0237 09/24/23 0339  WBC 8.4 8.7 7.6 7.8 8.8  HGB 8.0* 7.5* 7.1* 6.9* 7.9*  HCT 27.7* 25.3* 23.9* 23.4* 27.1*  MCV 81.0 81.4 81.8 81.8 82.9  PLT 262 226 266 207 279   Basic Metabolic Panel: Recent Labs  Lab 09/20/23 0355 09/21/23 0315 09/22/23 0340 09/23/23 0237 09/24/23 0339  NA 137 133*  133* 131*  131* 131* 130*  130*  K 4.2 3.6  3.7 3.6  3.6 3.8 3.5  3.5  CL 100 97*  96* 95*  95* 94* 95*  94*  CO2 25 26  26 26  25 27 25  25   GLUCOSE 80 98  97 107*  106* 98 102*  101*  BUN 90* 55*  54* 36*  34* 44* 30*  30*  CREATININE 6.23* 4.40*  4.39* 3.53*  3.63* 5.31* 3.91*  3.91*  CALCIUM 8.1*  7.9*  7.8* 7.8*  7.9* 8.2* 8.1*  8.1*  MG  --  1.8 1.8 1.9 1.9  PHOS 7.6* 4.4 3.7 4.6 3.3   Liver Function Tests: Recent Labs  Lab 09/20/23 0355 09/21/23 0315 09/22/23 0340 09/23/23 0237 09/24/23 0339  ALBUMIN 2.1* 2.0* 2.0* 2.0* 2.1*   No results for input(s): "LIPASE", "AMYLASE" in the last 168 hours. No results for input(s): "AMMONIA" in the last 168 hours. Cardiac Enzymes: No results for input(s): "CKTOTAL", "CKMB", "CKMBINDEX", "TROPONINI" in the last 168 hours. BNP (last 3 results) No results for input(s): "BNP" in the last 8760 hours.  ProBNP (last 3 results) No results for input(s): "PROBNP" in the last 8760 hours.  CBG: No results for input(s): "GLUCAP" in the last 168 hours. No results found for this or any previous visit (from the past 240 hours).   Studies: No results found.     Joycelyn Das, MD  [facility] 09/24/2023  If 7PM-7AM, please contact night-coverage

## 2023-09-24 NOTE — Plan of Care (Signed)
  Problem: Health Behavior/Discharge Planning: Goal: Ability to manage health-related needs will improve Outcome: Progressing   Problem: Clinical Measurements: Goal: Ability to maintain clinical measurements within normal limits will improve Outcome: Progressing Goal: Will remain free from infection Outcome: Progressing Goal: Diagnostic test results will improve Outcome: Progressing Goal: Respiratory complications will improve Outcome: Progressing Goal: Cardiovascular complication will be avoided Outcome: Progressing   Problem: Activity: Goal: Risk for activity intolerance will decrease Outcome: Progressing   Problem: Nutrition: Goal: Adequate nutrition will be maintained Outcome: Progressing   Problem: Coping: Goal: Level of anxiety will decrease Outcome: Progressing   Problem: Elimination: Goal: Will not experience complications related to bowel motility Outcome: Progressing Goal: Will not experience complications related to urinary retention Outcome: Progressing   Problem: Pain Managment: Goal: General experience of comfort will improve and/or be controlled Outcome: Progressing   Problem: Safety: Goal: Ability to remain free from injury will improve Outcome: Progressing   Problem: Skin Integrity: Goal: Risk for impaired skin integrity will decrease Outcome: Progressing   Problem: Clinical Measurements: Goal: Ability to avoid or minimize complications of infection will improve Outcome: Progressing   Problem: Skin Integrity: Goal: Skin integrity will improve Outcome: Progressing   Problem: Education: Goal: Understanding of CV disease, CV risk reduction, and recovery process will improve Outcome: Progressing Goal: Individualized Educational Video(s) Outcome: Progressing   Problem: Activity: Goal: Ability to return to baseline activity level will improve Outcome: Progressing   Problem: Cardiovascular: Goal: Ability to achieve and maintain adequate  cardiovascular perfusion will improve Outcome: Progressing Goal: Vascular access site(s) Level 0-1 will be maintained Outcome: Progressing   Problem: Health Behavior/Discharge Planning: Goal: Ability to safely manage health-related needs after discharge will improve Outcome: Progressing

## 2023-09-24 NOTE — Progress Notes (Addendum)
 [facility] Progress Note  Subjective:  Seen in room 2.2 L off w/ HD yest Wts down to 98kg   Vitals:   09/24/23 0005 09/24/23 0010 09/24/23 0538 09/24/23 0806  BP: (!) 103/51 109/64 97/62 109/69  Pulse: (!) 103 (!) 106 (!) 113   Resp: 19 (!) 21 19 20   Temp: 97.9 F (36.6 C) 97.8 F (36.6 C) 97.7 F (36.5 C) 98.4 F (36.9 C)  TempSrc: Oral Oral Oral Oral  SpO2: 97% 93% 92% 98%  Weight:      Height:        Exam: Gen: NAD, [place] O2 CVS: tachy Resp: CTA Abd: +BS, soft, NT/ND Ext: still has 1+ bilat hip edema > pretib edema, also UE edema  Assessment/ Plan: AKI: b/l creat is 1.3, from PCP's office in Jan 2024. Assuming this is AKI but could also have CKD. UA shows some rbc's/ wbc's, no proteinuria. Renal US w/o obstruction. BP's were soft in 90s on presentation and then improved after IV abx and IVF's, but pt developed pulm edema on CXR #2 w/ bilat LE edema. Suspect AKI due to CHF/ cardiorenal. Started IV lasix 3/19 and titrated up the dose. Diuresed well x several days then BP dropped 3/23 so Lasix was decreased to 80 mg IV tid, then stopped.  UOP dropped but Scr stable. A renal CO2 angiogram was negative for renal artery stenosis. Then lasix was started po w/ increased UOP but unfortunately the Scr rose up to 3 then 4 then 6.  Repeat renal US showed no hydro, exam and CXR showed persistent fluid overload. IR placed temp cath 4/02. Pt was started on iHD 4/2, and got his 2nd HD 4/3 and 3rd session Saturday. Cont to lower volume. While he is looking better w/ acute HD I am concerned that long-term outpatient dialysis may be too much for him, due to his debility. Pt is concerned also. Pt agreeable to consult w/ palliative care for GOC. Next HD 4/08.  Volume - diffuse LE edema and +CHF on all CXR's. Volume overload improving on exam, wt's are coming down. Cont to lower vol w/ HD. CXR today showing improvement (poor film).  BP - bp's soft, metoprolol was stopped. Tolerating UF w/  HD.  Atrial fib - metoprolol dc'd due to soft BP's. HR's 100-120s, cardiology consulting.  Gross hematuria: appreciate urology assistance.  Anemia - hgb down to 6.7. PRBC's ordered for today.  LLE cellulitis: SP IV abx then keflex, now off abx COPD/ chronic resp failure - on home O2 2 L S/p TAVR Disposition - came from assisted living at [facility]. Would benefit from PT input given what looks like mod-severe deconditioning.   Vinson Moselle  MD  [facility] 09/21/2023, 10:04 AM  Recent Labs  Lab 09/20/23 0355 09/20/23 1212 09/21/23 0315  HGB  --  8.0* 7.5*  ALBUMIN 2.1*  --  2.0*  CALCIUM 8.1*  --  7.9*  7.8*  PHOS 7.6*  --  4.4  CREATININE 6.23*  --  4.40*  4.39*  K 4.2  --  3.6  3.7    Inpatient medications:  atorvastatin  80 mg Oral QODAY   Chlorhexidine Gluconate Cloth  6 each Topical Q0600   Chlorhexidine Gluconate Cloth  6 each Topical Q0600   LORazepam  1 mg Oral BID   mometasone-formoterol  2 puff Inhalation BID   multivitamin with minerals  1 tablet Oral Q breakfast   pantoprazole  40 mg Oral Daily   sevelamer carbonate  800 mg Oral TID WC   sodium chloride flush  3 mL Intravenous Q12H   venlafaxine XR  75 mg Oral Q breakfast    albumin human     cefTRIAXone (ROCEPHIN)  IV 2 g (09/20/23 1311)   acetaminophen **OR** acetaminophen, acetaminophen, albumin human, albuterol, alum & mag hydroxide-simeth, camphor-menthol, hydrALAZINE, melatonin, ondansetron (ZOFRAN) IV, mouth rinse, oxyCODONE, polyethylene glycol, sodium chloride flush, traMADol

## 2023-09-24 NOTE — Consult Note (Signed)
 Cardiology Consultation   Patient ID: Jordan [name] MRN: [id number]; DOB: [birth year]  Admit date: 09/01/2023 Date of Consult: 09/24/2023  PCP:  Kennith Center, NP   [facility] Providers Cardiologist:  Donato Schultz, MD  Structural Heart:  Tonny Bollman, MD      Patient Profile:   Jordan [name] is a 73 y.o. 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  History of Present Illness:   Jordan [name] is a 73 year old male with prior cardiac history below. Had a pre-TAVR cath on 02/2020 that showed nonobstructing plaque in the RCA, widely patent left main and LAD, mild to moderate mid circumflex stenosis. Had TAVR placed 06/2020.  Echo on 07/2020 showed LVEF 60 to 65% and mild to moderate paravalvular leak.   Was admitted on 12/2020 for HFpEF and A-fib with RVR likely due to the cellulitis in the left leg.  Started on Eliquis 5 mg twice daily.  Was readmitted on 02/2021 for HFpEF and A-fib with RVR. At last cardiology follow-up 10/2021 he was in NSR. However, during subsequent EKGs 02/2022 and 03/2022 he was back in afib, rate controlled. Prior to admission patient was on eliquis, atenolol, lipitor.  Jordan [name] presented to the emergency department on 09/01/2023 from a skilled nursing facility with increasing leg pain. Prior to this there was an incident at his SNF during wheelchair transfer where some of his skin had gotten scraped. Patient also has a history of chronic venous insufficiency.   Patient was admitted for cellulitis and was treated with IV antibiotics. During the hospitalization the patient developed diastolic heart failure and became volume overloaded. This was treated with IV lasix initially but the Patient developed acute renal failure likely due to CHF/cardiorenal and was started on hemodialysis and is seen by nephrology. The most recent chest x-ray on 09/24/23 showed moderate bilateral pleural effusions with right greater than left that had improved from previous imaging. While in the hospital the patient developed acute hematuria with progressive anemia and received 1 unit of packed red blood cells on 09/23/2023.  Eliquis was held because of this.  Patient also received an albumin infusion,  Over the past day the patient developed atrial fibrillation with RVR with rates as high as 149. He was on metoprolol 12.5mg  BID earlier this admission, stopped 4/2. Yesterday he received 25mg  orally plus 5mg  IV, and is now on metoprolol 25 mg twice daily most recent pulse rate on vitals was 113.  Blood pressures are slightly hypotensive the last several days. Follow-up echo on 09/08/2023 was a poor study quality but LVEF was estimated to be 60 to 65%, valve not well imaged, pictures almost uninterpretable.   On interview patient denied having shortness of breath, chest pain, orthopnea, PND, diaphoresis, palpitations, has had lower extremity edema, nausea, vomiting, and diarrhea. Reported having atrial fibrillation for at least the past year. Used to wear CPAP but does not currently wear.  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 Excell Seltzer and Dr. Laneta Simmers   Severe aortic stenosis    Severe aortic insufficiency, moderate to severe aortic stenosis   Sleep apnea    no longer uses cpap   Venous insufficiency    managed by ortho (Dr. Lajoyce Corners)    Past Surgical History:  Procedure Laterality Date   ABDOMINAL AORTOGRAM N/A 09/12/2023   Procedure: ABDOMINAL AORTOGRAM;  Surgeon: Nada Libman, MD;  Location: [facility] INVASIVE CV LAB;  Service: Cardiovascular;  Laterality: N/A;   ANKLE SURGERY Right    Dr. Carola Frost, has pins   CARDIOVERSION N/A 07/16/2021   Procedure: CARDIOVERSION;  Surgeon: Thomasene Ripple, DO;  Location: [facility] ENDOSCOPY;  Service: Cardiovascular;  Laterality: N/A;   COLONOSCOPY W/ POLYPECTOMY     IR FLUORO GUIDE CV LINE RIGHT  09/20/2023   IR US GUIDE VASC ACCESS RIGHT  09/20/2023   KNEE ARTHROSCOPY Right    Dr. Thurston Hole   LOWER EXTREMITY ANGIOGRAPHY N/A 09/12/2023   Procedure: Lower Extremity Angiography;  Surgeon: Nada Libman, MD;  Location: [facility] INVASIVE CV LAB;  Service: Cardiovascular;  Laterality: N/A;   MULTIPLE EXTRACTIONS WITH ALVEOLOPLASTY Bilateral 05/22/2020   Procedure: MULTIPLE EXTRACTION WITH ALVEOLOPLASTY;  Surgeon: Ocie Doyne, DMD;  Location: [facility] OR;  Service: Oral Surgery;  Laterality: Bilateral;   right foot surgery,rt great toe callus removal     RIGHT/LEFT HEART CATH AND CORONARY ANGIOGRAPHY N/A 03/06/2020   Procedure: RIGHT/LEFT HEART CATH AND CORONARY ANGIOGRAPHY;  Surgeon: Tonny Bollman, MD;  Location: [facility] INVASIVE CV LAB;  Service: Cardiovascular;  Laterality: N/A;   TEE WITHOUT CARDIOVERSION N/A 03/09/2020   Procedure: TRANSESOPHAGEAL ECHOCARDIOGRAM (TEE);  Surgeon: Lars Masson, MD;  Location: [facility] ENDOSCOPY;  Service: Cardiovascular;  Laterality: N/A;   TEE WITHOUT CARDIOVERSION  N/A 06/30/2020   Procedure: TRANSESOPHAGEAL ECHOCARDIOGRAM (TEE);  Surgeon: Tonny Bollman, MD;  Location: [facility] OR;  Service: Open Heart Surgery;  Laterality: N/A;   TONSILLECTOMY AND ADENOIDECTOMY     age 35   TOTAL KNEE ARTHROPLASTY Right 02/07/2022   Procedure: TOTAL KNEE ARTHROPLASTY;  Surgeon: Ollen Gross, MD;  Location: [facility] ORS;  Service: Orthopedics;  Laterality: Right;   TRANSCATHETER AORTIC VALVE REPLACEMENT, TRANSFEMORAL N/A 06/30/2020   Procedure: TRANSCATHETER AORTIC VALVE REPLACEMENT, TRANSFEMORAL;  Surgeon: Tonny Bollman, MD;  Location[facility] OR;  Service: Open Heart Surgery;  Laterality: N/A;   UPPER GI ENDOSCOPY       Home Medications:  Prior to Admission medications   Medication Sig Start Date End Date Taking? Authorizing Provider  ACETAMINOPHEN-BUTALBITAL 50-325 MG TABS Take 1 tablet by mouth 2 (two) times daily as needed (migraines).   Yes [provider]  atenolol (TENORMIN) 50 MG tablet Take 50 mg by mouth daily.   Yes [provider]  atorvastatin (LIPITOR) 80 MG tablet Take 80 mg by mouth every other day.   Yes [provider]  budesonide-formoterol (SYMBICORT)  160-4.5 MCG/ACT inhaler Inhale 2 puffs into the lungs in the morning and at bedtime. Patient taking differently: Inhale 2 puffs into the lungs 2 (two) times daily as needed (for shortness of breath or wheezing). 03/12/20  Yes Glade Lloyd, MD  doxycycline (VIBRA-TABS) 100 MG tablet Take 100 mg by mouth 2 (two) times daily. 07/11/23  Yes [provider]  EFFEXOR XR 75 MG 24 hr capsule Take 75 mg by mouth daily with breakfast.   Yes [provider]  ELIQUIS 5 MG TABS tablet TAKE 1 TABLET BY MOUTH TWICE A DAY 06/22/22  Yes Weaver, [name] T, PA-C  ezetimibe (ZETIA) 10 MG tablet Take 10 mg by mouth once a week.   Yes [provider]  furosemide (LASIX) 40 MG tablet TAKE 1 TABLET BY MOUTH EVERY OTHER DAY ALTERNATING WITH 1/2 TABLET EVERY OTHER DAY Patient taking  differently: Take 20-40 mg by mouth See admin instructions. Take 1 tablet by mouth every other day alternating with 1/2 tablet every other day 04/04/22  Yes Jake Bathe, MD  LORazepam (ATIVAN) 1 MG tablet Take 1 mg by mouth 2 (two) times daily.   Yes [provider]  melatonin 5 MG TABS Take 5 mg by mouth.   Yes [provider]  miconazole (MICOTIN) 2 % cream Apply 1 Application topically 2 (two) times daily.   Yes [provider]  montelukast (SINGULAIR) 10 MG tablet Take 10 mg by mouth as needed (for allergies). 02/07/20  Yes [provider]  Multiple Vitamin (MULTIVITAMIN WITH MINERALS) TABS Take 1 tablet by mouth daily with breakfast.   Yes [provider]  pantoprazole (PROTONIX) 40 MG tablet Take 40 mg by mouth daily.   Yes [provider]  pantoprazole (PROTONIX) 40 MG tablet Take 40 mg by mouth daily.   Yes [provider]  traMADol (ULTRAM) 50 MG tablet Take 50 mg by mouth 2 (two) times daily as needed.   Yes [provider]  Vitamin D, Ergocalciferol, 50000 units CAPS Take 50,000 Units by mouth daily.   Yes [provider]  albuterol (VENTOLIN HFA) 108 (90 Base) MCG/ACT inhaler Inhale 1-2 puffs into the lungs every 6 (six) hours as needed for wheezing or shortness of breath. Patient not taking: Reported on 09/01/2023    [provider]    Inpatient Medications: Scheduled Meds:  atorvastatin  80 mg Oral QODAY   Chlorhexidine Gluconate Cloth  6 each Topical Q0600   Chlorhexidine Gluconate Cloth  6 each Topical Q0600   feeding supplement (NEPRO CARB STEADY)  237 mL Oral BID BM   LORazepam  1 mg Oral BID   metoprolol tartrate  25 mg Oral BID   mometasone-formoterol  2 puff Inhalation BID   multivitamin with minerals  1 tablet Oral Q breakfast   pantoprazole  40 mg Oral Daily   sevelamer carbonate  800 mg Oral TID WC   sodium chloride flush  3 mL Intravenous Q12H   venlafaxine XR  75 mg Oral Q  breakfast   Continuous Infusions:  albumin human     PRN Meds: acetaminophen **OR** acetaminophen, acetaminophen, albumin human, albuterol, alum & mag hydroxide-simeth, camphor-menthol, guaiFENesin, hydrALAZINE, melatonin, ondansetron, mouth rinse, oxyCODONE, polyethylene glycol, sodium chloride flush, traMADol  Allergies:    Allergies  Allergen Reactions   Altace [Ramipril] Other (See Comments)    Dizziness, migraine, weakness- "there is dye in it"   Codeine Itching   Metoprolol Tartrate Other (See Comments)    Dropped the  B/P too low- eventually stopped by a MD   Naproxen Itching    Social History:   Social History   Socioeconomic History   Marital status: Single    Spouse name: Not on file   Number of children: 0   Years of education: Not on file   Highest education level: Not on file  Occupational History   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     Social Drivers of Corporate investment banker Strain: Not on file  Food Insecurity: Patient Declined (09/01/2023)   Hunger Vital Sign    Worried About Running Out of Food in the Last Year: Patient declined    Ran Out of Food in the Last Year: Patient declined  Transportation Needs: Patient Declined (09/01/2023)   PRAPARE - Administrator, Civil Service (Medical): Patient declined    Lack of Transportation (Non-Medical): Patient declined  Physical Activity: Not on file  Stress: Not on file  Social Connections:  Patient Declined (09/01/2023)   Social Connection and Isolation Panel [NHANES]    Frequency of Communication with Friends and Family: Patient declined    Frequency of Social Gatherings with Friends and Family: Patient declined    Attends Religious Services: Patient declined    Database administrator or Organizations: Patient declined    Attends Banker Meetings: Patient declined    Marital Status: Patient declined  Intimate Partner Violence: Patient Declined (09/01/2023)   Humiliation, Afraid, Rape, and Kick questionnaire    Fear of Current or Ex-Partner: Patient declined    Emotionally Abused: Patient declined    Physically Abused: Patient declined    Sexually Abused: Patient declined    Family History:    Family History  Problem Relation Age of Onset   Hypertension Mother    Alzheimer's disease Mother    Stroke Father        x 3   Heart attack Father    Congestive Heart Failure Father    Diabetes Other        mat great aunts   Colon cancer Neg Hx    Rectal cancer Neg Hx    Esophageal cancer Neg Hx      ROS:  Please see the history of present illness.   All other ROS reviewed and negative.     Physical Exam/Data:   Vitals:   09/24/23 0005 09/24/23 0010 09/24/23 0538 09/24/23 0806  BP: (!) 103/51 109/64 97/62 109/69  Pulse: (!) 103 (!) 106 (!) 113   Resp: 19 (!) 21 19 20   Temp: 97.9 F (36.6 C) 97.8 F (36.6 C) 97.7 F (36.5 C) 98.4 F (36.9 C)  TempSrc: Oral Oral Oral Oral  SpO2: 97% 93% 92% 98%  Weight:      Height:        Intake/Output Summary (Last 24 hours) at 09/24/2023 1207 Last data filed at 09/24/2023 0600 Gross  per 24 hour  Intake 420 ml  Output 252.2 ml  Net 167.8 ml      09/23/2023    3:58 PM 09/23/2023   12:37 PM 09/23/2023   12:35 PM  Last 3 Weights  Weight (lbs) 216 lb 4.3 oz 222 lb 10.6 oz 222 lb 10.6 oz  Weight (kg) 98.1 kg 101 kg 101 kg     Body mass index is 28.53 kg/m.  General:  Well nourished, well developed, in no acute  distress HEENT: normal Neck: unable to assess JVD because patient had discomfort with moving neck. Vascular:  Distal pulses 2+ bilaterally Cardiac:  tachycardic, irregular S1, S2; RRR; no murmur  Lungs:  upper lobes clear to auscultation bilaterally, absent breath sounds in lower lobes Ext: 2+ edema, healing wounds on lower extremities. Skin: warm and dry  Neuro:  no focal abnormalities noted Psych:  Normal affect   EKG:  The EKG was personally reviewed and demonstrates:  EKG on 09/01/23 showed atrial fibrillation with a rate of 94 Telemetry:  Telemetry was personally reviewed and demonstrates:  Atrial fibrillation with rates early in the night in the 120's but improved at about 10pm and is currently in the 100's.  Relevant CV Studies: Echo on 09/08/23    1. Very poor study, even with Definity contrast - almost uninterpretable.   2. Left ventricular ejection fraction appears grossly normal - estimated  at 60 to 65%. The left ventricle has normal function. The left ventricle  has no regional wall motion abnormalities. Left ventricular diastolic  parameters are indeterminate.   3. Right ventricular systolic function was not well visualized. The right  ventricular size is not well visualized.   4. The mitral valve was not well visualized. No evidence of mitral valve  regurgitation.   5. TAVR valve is not well-visualized. The aortic valve was not well  visualized. Aortic valve regurgitation is not visualized. There is a 26 mm  Sapien prosthetic (TAVR) valve present in the aortic position.   Comparison(s): Changes from prior study are noted. 07/14/2021: LVEF 55-60%,  TAVR valve.   FINDINGS   Left Ventricle: Left ventricular ejection fraction, by estimation, is 60  to 65%. The left ventricle has normal function. Left ventricular  endocardial border not optimally defined to evaluate regional wall motion.  Definity contrast agent was given IV to  delineate the left ventricular endocardial  borders. The left ventricular  internal cavity size was normal in size. There is no left ventricular  hypertrophy. Left ventricular diastolic parameters are indeterminate.   Right Ventricle: The right ventricular size is not well visualized. Right  vetricular wall thickness was not well visualized. Right ventricular  systolic function was not well visualized.   Left Atrium: Left atrial size was not well visualized.   Right Atrium: Right atrial size was not well visualized.   Pericardium: The pericardium was not well visualized.   Mitral Valve: The mitral valve was not well visualized. No evidence of  mitral valve regurgitation. MV peak gradient, 5.8 mmHg. The mean mitral  valve gradient is 2.0 mmHg.   Tricuspid Valve: The tricuspid valve is not well visualized. Tricuspid  valve regurgitation is not demonstrated.   Aortic Valve: TAVR valve is not well-visualized. The aortic valve was not  well visualized. Aortic valve regurgitation is not visualized. Aortic  valve mean gradient measures 2.0 mmHg. Aortic valve peak gradient measures  3.9 mmHg. Aortic valve area, by VTI   measures 2.12 cm. There is a 26 mm Sapien  prosthetic, stented (TAVR)  valve present in the aortic position.   Pulmonic Valve: The pulmonic valve was not well visualized. Pulmonic valve  regurgitation is not visualized.   Aorta: The aortic root was not well visualized.   IAS/Shunts: The interatrial septum was not well visualized.   Laboratory Data:  High Sensitivity Troponin:  No results for input(s): "TROPONINIHS" in the last 720 hours.   Chemistry Recent Labs  Lab 09/22/23 0340 09/23/23 0237 09/24/23 0339  NA 131*  131* 131* 130*  130*  K 3.6  3.6 3.8 3.5  3.5  CL 95*  95* 94* 95*  94*  CO2 26  25 27 25  25   GLUCOSE 107*  106* 98 102*  101*  BUN 36*  34* 44* 30*  30*  CREATININE 3.53*  3.63* 5.31* 3.91*  3.91*  CALCIUM 7.8*  7.9* 8.2* 8.1*  8.1*  MG 1.8 1.9 1.9  GFRNONAA 17*  17*  11* 15*  15*  ANIONGAP 10  11 10 10  11     Recent Labs  Lab 09/22/23 0340 09/23/23 0237 09/24/23 0339  ALBUMIN 2.0* 2.0* 2.1*   Lipids No results for input(s): "CHOL", "TRIG", "HDL", "LABVLDL", "LDLCALC", "CHOLHDL" in the last 168 hours.  Hematology Recent Labs  Lab 09/22/23 0340 09/23/23 0237 09/24/23 0339  WBC 7.6 7.8 8.8  RBC 2.92* 2.86* 3.27*  HGB 7.1* 6.9* 7.9*  HCT 23.9* 23.4* 27.1*  MCV 81.8 81.8 82.9  MCH 24.3* 24.1* 24.2*  MCHC 29.7* 29.5* 29.2*  RDW 25.0* 25.3* 24.2*  PLT 266 207 279   Thyroid No results for input(s): "TSH", "FREET4" in the last 168 hours.  BNPNo results for input(s): "BNP", "PROBNP" in the last 168 hours.  DDimer No results for input(s): "DDIMER" in the last 168 hours.   Radiology/Studies:  DG CHEST PORT 1 VIEW Result Date: 09/24/2023 CLINICAL DATA:  Follow-up pulmonary edema. EXAM: PORTABLE CHEST 1 VIEW COMPARISON:  Chest radiograph and CT 09/19/2023 FINDINGS: Right-sided central line tip overlies the atrial caval junction. Moderate bilateral pleural effusions, right greater than left, with slight improvement from prior exam. Similar interstitial thickening likely pulmonary edema. Unchanged cardiomegaly. TAVR. No pneumothorax. IMPRESSION: 1. Moderate bilateral pleural effusions, right greater than left, with slight improvement from prior exam. 2. Similar interstitial thickening likely pulmonary edema. 3. Unchanged cardiomegaly. Electronically Signed   By: Narda Rutherford M.D.   On: 09/24/2023 10:06   IR Fluoro Guide CV Line Right Result Date: 09/20/2023 INDICATION: 73 year old male referred for temporary hemodialysis catheter EXAM: IMAGE GUIDED NON TUNNELED/TEMPORARY HEMODIALYSIS CATHETER MEDICATIONS: None ANESTHESIA/SEDATION: Moderate (conscious) sedation was not employed during this procedure. In FLUOROSCOPY TIME:  Fluoroscopy time: (2.1 MGy). COMPLICATIONS: None None PROCEDURE: formed written consent was obtained from the patient/family after a  discussion of the risks, benefits, and alternatives to treatment. Questions regarding the procedure were encouraged and answered. The right neck was prepped with chlorhexidine in a sterile fashion, and a sterile drape was applied covering the operative field. Maximum barrier sterile technique with sterile gowns and gloves were used for the procedure. A timeout was performed prior to the initiation of the procedure. A micropuncture kit was utilized to access the right internal jugular vein under direct, real-time ultrasound guidance after the overlying soft tissues were anesthetized with 1% lidocaine with epinephrine. Ultrasound image documentation was performed. The microwire was kinked to measure appropriate catheter length. A stiff glidewire was advanced to the level of the IVC. A 16 cm hemodialysis catheter was then placed over the  wire. Wire was removed. This catheter would not function, apparently too short. Wire was then advanced to the IVC and a new 20 cm temporary catheter was placed. Final catheter positioning was confirmed and documented with a spot radiographic image. The 20 cm catheter aspirates and flushes normally. The catheter was flushed with appropriate volume heparin dwells. Dressings were applied. The patient tolerated the procedure well without immediate post procedural complication. IMPRESSION: Status post image guided placement of temporary right IJ triple-lumen hemodialysis catheter. Signed, Yvone Neu. Miachel Roux, RPVI [facility] Radiology Electronically Signed   By: Gilmer Mor D.O.   On: 09/20/2023 17:29   IR US Guide Vasc Access Right Result Date: 09/20/2023 INDICATION: 73 year old male referred for temporary hemodialysis catheter EXAM: IMAGE GUIDED NON TUNNELED/TEMPORARY HEMODIALYSIS CATHETER MEDICATIONS: None ANESTHESIA/SEDATION: Moderate (conscious) sedation was not employed during this procedure. In FLUOROSCOPY TIME:  Fluoroscopy  time: (2.1 MGy). COMPLICATIONS: None None PROCEDURE: formed written consent was obtained from the patient/family after a discussion of the risks, benefits, and alternatives to treatment. Questions regarding the procedure were encouraged and answered. The right neck was prepped with chlorhexidine in a sterile fashion, and a sterile drape was applied covering the operative field. Maximum barrier sterile technique with sterile gowns and gloves were used for the procedure. A timeout was performed prior to the initiation of the procedure. A micropuncture kit was utilized to access the right internal jugular vein under direct, real-time ultrasound guidance after the overlying soft tissues were anesthetized with 1% lidocaine with epinephrine. Ultrasound image documentation was performed. The microwire was kinked to measure appropriate catheter length. A stiff glidewire was advanced to the level of the IVC. A 16 cm hemodialysis catheter was then placed over the wire. Wire was removed. This catheter would not function, apparently too short. Wire was then advanced to the IVC and a new 20 cm temporary catheter was placed. Final catheter positioning was confirmed and documented with a spot radiographic image. The 20 cm catheter aspirates and flushes normally. The catheter was flushed with appropriate volume heparin dwells. Dressings were applied. The patient tolerated the procedure well without immediate post procedural complication. IMPRESSION: Status post image guided placement of temporary right IJ triple-lumen hemodialysis catheter. Signed, Yvone Neu. Miachel Roux, RPVI [facility] Radiology Electronically Signed   By: Gilmer Mor D.O.   On: 09/20/2023 17:29     Assessment and Plan:   Jordan [name] is a 73 y.o. 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  Persistent atrial fibrillation CHA2DS2-VASc Score = 6  :1} This indicates a 9.7% annual risk of stroke. The patient's score is based upon: CHF History: 1 HTN History: 1 Diabetes History: 0 Stroke History: 2 Vascular Disease History: 1 Age Score: 1 Gender Score: 0 Has been in atrial fibrillation on every EKG for well over a year and was on atenolol PTA for rate control. atrial fibrillation is asymptomatic. patient was hospitalized twice in 2022 for A-fib with RVR and diastolic heart failure. He was last in NSR  10/2021, but in afib from at least 02/2022 onward.  Prior to admission patient was on Eliquis.  During hospitalization patient developed acute hematuria, hemoglobin dropped to 6.9, pt required packed red blood cells.  Because of this Eliquis was held.  Yesterday the patient developed increased heart rates with atrial fibrillation  metoprolol tartrate was increased to 25 mg twice daily.  Today the patient is rate controlled with rates in the 100s.  Pressures have been slightly hypotensive. -- Suspect that elevated ventricular rates may be secondary to acute anemia as rates appeared to improve after blood transfusion at 09/23/23 at 7pm.  -- Order TSH for tomorrow. --Continue to hold Eliquis --Continue metoprolol 25 mg twice daily --Consider midorine 2.5mg  TID for hypotension -- Will discuss strategy with Md. Anticipate continued plan for rate control given duration of atrial fib, physiologic stressors, and inability to pursue rhythm control given off anticoagulation -- Continue to optimize  underlying medical illness   Heart failure with preserved ejection fraction complicated by AKI progressing to HD Patient became volume overloaded during this hospitalization and required Lasix.  He developed an AKI and was started on dialysis.  Is currently seen by nephrology.  Chest x-ray done today showed moderate bilateral pleural effusions. This was slightly improved from past chest x-ray. Echo on 09/08/2023 was poor quality but showed LVEF of 60 to 65%. -- Volume overload is managed by dialysis.   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 -- Echo on 06/2021 showed a mild perivalvular leak.  -- Valve was unable to be assessed on most recent echo on 09/08/2023 due to poor quality. -- Consider follow-up echo to assess perivalvular leak  Hypotension Blood pressures have been hypotensive --Consider midorine 2.5mg  TID for hypotension   Hyperlipidemia -- Continue Lipitor  Obstructive sleep apnea -- Reported wearing CPAP in the past but is currently not wearing a CPAP -- Consider outpatient sleep study to assess if needs CPAP  Tobacco abuse -- Recommend cessation.  Other conditions management per primary   Risk Assessment/Risk Scores:        :[id number]}      New York Heart Association (NYHA) Functional Class NYHA Class II  CHA2DS2-VASc Score = 6   This indicates a 9.7% annual risk of stroke. The patient's score is based upon: CHF History: 1 HTN History: 1 Diabetes History: 0 Stroke History: 2 Vascular Disease History: 1 Age Score: 1 Gender Score: 0         For questions or updates, please contact [facility] Please consult [email or web address] for contact info under    Signed, Arabella Merles, PA-C  09/24/2023 12:07 PM

## 2023-09-25 DIAGNOSIS — I4819 Other persistent atrial fibrillation: Secondary | ICD-10-CM | POA: Diagnosis not present

## 2023-09-25 DIAGNOSIS — Z7189 Other specified counseling: Secondary | ICD-10-CM | POA: Diagnosis not present

## 2023-09-25 DIAGNOSIS — Z952 Presence of prosthetic heart valve: Secondary | ICD-10-CM | POA: Diagnosis not present

## 2023-09-25 DIAGNOSIS — N179 Acute kidney failure, unspecified: Secondary | ICD-10-CM | POA: Diagnosis not present

## 2023-09-25 DIAGNOSIS — Z515 Encounter for palliative care: Secondary | ICD-10-CM | POA: Diagnosis not present

## 2023-09-25 DIAGNOSIS — E871 Hypo-osmolality and hyponatremia: Secondary | ICD-10-CM

## 2023-09-25 DIAGNOSIS — I5032 Chronic diastolic (congestive) heart failure: Secondary | ICD-10-CM | POA: Diagnosis not present

## 2023-09-25 LAB — CBC
HCT: 27.3 % — ABNORMAL LOW (ref 39.0–52.0)
Hemoglobin: 8 g/dL — ABNORMAL LOW (ref 13.0–17.0)
MCH: 24.5 pg — ABNORMAL LOW (ref 26.0–34.0)
MCHC: 29.3 g/dL — ABNORMAL LOW (ref 30.0–36.0)
MCV: 83.5 fL (ref 80.0–100.0)
Platelets: 262 10*3/uL (ref 150–400)
RBC: 3.27 MIL/uL — ABNORMAL LOW (ref 4.22–5.81)
RDW: 24.2 % — ABNORMAL HIGH (ref 11.5–15.5)
WBC: 8.3 10*3/uL (ref 4.0–10.5)
nRBC: 0 % (ref 0.0–0.2)

## 2023-09-25 LAB — RENAL FUNCTION PANEL
Albumin: 2 g/dL — ABNORMAL LOW (ref 3.5–5.0)
Anion gap: 8 (ref 5–15)
BUN: 43 mg/dL — ABNORMAL HIGH (ref 8–23)
CO2: 28 mmol/L (ref 22–32)
Calcium: 8.1 mg/dL — ABNORMAL LOW (ref 8.9–10.3)
Chloride: 94 mmol/L — ABNORMAL LOW (ref 98–111)
Creatinine, Ser: 5.31 mg/dL — ABNORMAL HIGH (ref 0.61–1.24)
GFR, Estimated: 11 mL/min — ABNORMAL LOW (ref >60)
Glucose, Bld: 87 mg/dL (ref 70–99)
Phosphorus: 4.1 mg/dL (ref 2.5–4.6)
Potassium: 3.7 mmol/L (ref 3.5–5.1)
Sodium: 130 mmol/L — ABNORMAL LOW (ref 135–145)

## 2023-09-25 LAB — MAGNESIUM: Magnesium: 2.1 mg/dL (ref 1.7–2.4)

## 2023-09-25 LAB — TSH: TSH: 5.944 u[IU]/mL — ABNORMAL HIGH (ref 0.350–4.500)

## 2023-09-25 LAB — GLUCOSE, CAPILLARY: Glucose-Capillary: 101 mg/dL — ABNORMAL HIGH (ref 70–99)

## 2023-09-25 MED ORDER — IOHEXOL 300 MG/ML  SOLN
30.0000 mL | Freq: Once | INTRAMUSCULAR | Status: AC | PRN
  Administered 2023-09-25: 30 mL

## 2023-09-25 NOTE — Progress Notes (Signed)
 Physical Therapy Treatment Patient Details Name: Jordan [name] MRN: [id number] DOB: [birth year] Today's Date: 09/25/2023   History of Present Illness 73 yo male admitted with soft tissue infection/L LE wound, L toe laceration after sustaining a fall at ALF while be transferred back to bed per pt report. Hx of TAVR, bil LE edema, R TKA 07/2021, DM, Afib, anemia, anxiety, CHF, COPD-on O2 PRN per pt, obesity, aortic stenosis, venous insufficiency    PT Comments  Pt received in bed, reporting feeling tired and unable to get much sleep. Pt needed max A +2 to come to sitting EOB and then with posterior lean in sitting, needing mod A to remain sitting. Pt attempted standing with stedy for strengthening, unable to achieve full upright position even with max A +2. Pt worked on scooting bkwds into bed. Needed max A +2 to return to supine. SPO2 dropped to low 80's, 2L O2 replaced end of session. Pt did not have O2 on when PT arrived and wanted to leave it off for a little bit to give his nose a break. Patient will benefit from continued inpatient follow up therapy, <3 hours/day. PT will continue to follow.     If plan is discharge home, recommend the following: Assistance with cooking/housework;Assist for transportation;Help with stairs or ramp for entrance;Two people to help with walking and/or transfers;Two people to help with bathing/dressing/bathroom   Can travel by private vehicle     No  Equipment Recommendations  None recommended by PT    Recommendations for Other Services       Precautions / Restrictions Precautions Precautions: Fall;Other (comment) Precaution/Restrictions Comments: multiple LE wounds-tender to touch; chronic 2L O2 at baseline Restrictions Weight Bearing Restrictions Per Provider Order: No     Mobility  Bed Mobility Overal bed mobility: Needs Assistance Bed Mobility: Supine to Sit Rolling: Max assist, +2 for physical assistance, Used rails   Supine to sit: Max  assist, HOB elevated, Used rails Sit to supine: Max assist, +2 for physical assistance, HOB elevated   General bed mobility comments: pt needs frequent encouragement to remain sitting, fatigues very quickly    Transfers Overall transfer level: Needs assistance   Transfers: Sit to/from Stand Sit to Stand: From elevated surface, Via lift equipment, Max assist, +2 physical assistance           General transfer comment: pt would attempt standing with stedy only once before stating he was too tired. Unable to stand all the way up, LLE sliding fwd and hips flexed. Transfer via Lift Equipment: Stedy  Ambulation/Gait                   Stairs             Wheelchair Mobility     Tilt Bed    Modified Rankin (Stroke Patients Only)       Balance Overall balance assessment: Needs assistance Sitting-balance support: Bilateral upper extremity supported, Feet supported Sitting balance-Leahy Scale: Fair Sitting balance - Comments: posterior lean in sitting, needs mod A to maintain Postural control: Posterior lean Standing balance support: Bilateral upper extremity supported, Reliant on assistive device for balance Standing balance-Leahy Scale: Zero Standing balance comment: reliant on steady and +2 external support                            Communication Communication Communication: No apparent difficulties  Cognition Arousal: Alert Behavior During Therapy: WFL for tasks assessed/performed   PT -  Cognitive impairments: No apparent impairments                       PT - Cognition Comments: needing repeat cues at times, sometimes self distracted Following commands: Impaired Following commands impaired: Follows one step commands inconsistently, Follows one step commands with increased time    Cueing Cueing Techniques: Verbal cues, Tactile cues  Exercises General Exercises - Lower Extremity Ankle Circles/Pumps: AROM, Both, 20 reps     General Comments General comments (skin integrity, edema, etc.): pt with stiffness B shoulders and B LE's. Instructed pt to work on elbow flex/ ext and wrist rotation in bed as well as ankle pumps. SPO2 in low 80's off O2, 2L replaced      Pertinent Vitals/Pain Pain Assessment Pain Assessment: Faces Faces Pain Scale: Hurts little more Pain Location: R low back, BLE Pain Descriptors / Indicators: Grimacing, Discomfort Pain Intervention(s): Limited activity within patient's tolerance, Monitored during session    Home Living                          Prior Function            PT Goals (current goals can now be found in the care plan section) Acute Rehab PT Goals Patient Stated Goal: return to ALF PT Goal Formulation: With patient Time For Goal Achievement: 10/06/23 Potential to Achieve Goals: Fair Progress towards PT goals: Progressing toward goals (slowly)    Frequency    Min 2X/week      PT Plan      Co-evaluation              AM-PAC PT "6 Clicks" Mobility   Outcome Measure  Help needed turning from your back to your side while in a flat bed without using bedrails?: A Lot Help needed moving from lying on your back to sitting on the side of a flat bed without using bedrails?: Total Help needed moving to and from a bed to a chair (including a wheelchair)?: Total Help needed standing up from a chair using your arms (e.g., wheelchair or bedside chair)?: Total Help needed to walk in hospital room?: Total Help needed climbing 3-5 steps with a railing? : Total 6 Click Score: 7    End of Session Equipment Utilized During Treatment: Oxygen Activity Tolerance: Patient limited by fatigue Patient left: in bed;with call bell/phone within reach;with bed alarm set Nurse Communication: Mobility status PT Visit Diagnosis: Muscle weakness (generalized) (M62.81);History of falling (Z91.81);Other abnormalities of gait and mobility (R26.89) Pain - part of body: Leg      Time: 0920-0952 PT Time Calculation (min) (ACUTE ONLY): 32 min  Charges:    $Therapeutic Activity: 23-37 mins PT General Charges $$ ACUTE PT VISIT: 1 Visit                     Lyanne Co, PT  Acute Rehab Services Secure chat preferred Office [phone]    Jordan [name] 09/25/2023, 11:22 AM

## 2023-09-25 NOTE — Progress Notes (Addendum)
 PROGRESS NOTE  Jordan [name] BJY:[id number] DOB: Mar 19, 1951 DOA: 09/01/2023 PCP: Kennith Center, NP   LOS: 24 days   Brief narrative:  Jordan [name] is an 73 y.o. male with past medical history of hypertension, heart failure with preserved ejection fraction, atrial fibrillation, CAD, COPD with chronic hypoxic respiratory failure on 2 L of oxygen was brought into the hospital from skilled nursing facility with worsening left leg wound.  Patient with history of chronic venous stasis.  Patient stated that he was doing well until he got a new wheelchair last week and the nurses aide tried to put him in bed but lost her balance and he scraped his leg and his skin came off.  In the ED, patient was noted to have increased erythema over the denuded part of the skin.  WBC was elevated at 14, creatinine at 2.4 and blood pressure was 104/60.  Heart rate was 115.  Patient was started on vancomycin and cefepime and was admitted hospital for further evaluation and treatment.    Assessment and plan.  Principal Problem:   Left leg cellulitis Active Problems:   Acute renal failure (ARF) (HCC)   Gross hematuria   Hypotension   Pleural effusion on right   Essential hypertension   HLD (hyperlipidemia)   Chronic venous stasis dermatitis of both lower extremities   COPD (chronic obstructive pulmonary disease) (HCC)   S/P TAVR (transcatheter aortic valve replacement)   Chronic heart failure with preserved ejection fraction (HCC)   Persistent atrial fibrillation (HCC)   Chronic respiratory failure with hypoxia (HCC)  * Atrial fibrillation with RVR, history of persistent atrial fibrillation   Eliquis currently on hold due to hematuria.  Has had RVR and since there is concern for possible cardiorenal syndrome with borderline low blood pressure so cardiology was consulted and plan is to continue metoprolol and add midodrine if needed..  At this time heart rate is better controlled.   Acute renal  failure Seen by nephrology and patient has been started on hemodialysis during hospitalization.  Creatinine today at 5.3 Nephrology following the patient.  Hyponatremia. Being followed by nephrology likely secondary to renal failure.  Left leg cellulitis Status post 10-day Keflex and 5-day course of doxycycline.  Patient also was seen by vascular surgery and underwent arteriogram on 325/25 with no significant peripheral vascular disease. Improving.  Patient will need to follow-up with  Dr Lajoyce Corners as outpatient.  Hematuria Improved.  Cleared up.  Hemoglobin down has improved to 8.0 after blood transfusion for hemoglobin of 6.9.   Seen by urology and Eliquis on hold.  CT cystogram of the abdomen and pelvis due to hematuria was done which showed 3.8 x 1.5 x 2.7 cm focal disproportionate wall thickening along the superior wall of the bladder with trabeculation and small diverticuli.  Urology following.  Acute blood loss anemia secondary to hematuria.  Received 1 unit of packed RBC.  Hemoglobin today at 8.0 and has remained stable. Will transfuse for hemoglobin less than 7.  Pleural effusion on right On 2 L of oxygen.  Continue hemodialysis.   Hypotension has improved at this time   Chronic respiratory failure with hypoxia  Chronically on 2 L/min.  Will continue.      Chronic heart failure with preserved ejection fraction Patient had large right pleural effusion on CXR.  Continue volume management as per hemodialysis.  On 2 L of oxygen by nasal cannula.  S/P TAVR (transcatheter aortic valve replacement) Stable.   COPD (chronic obstructive  pulmonary disease) Continue nebulizers as needed.    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 Continue with wound care.  Pressure injury left heel unstageable.  Present on admission.  Continue offloading and wound care if necessary. Pressure Injury 09/02/23 Heel Left Unstageable - Full thickness tissue loss in which the base of  the injury is covered by slough (yellow, tan, gray, green or brown) and/or eschar (tan, brown or black) in the wound bed. (Active)  09/02/23 1713  Location: Heel  Location Orientation: Left  Staging: Unstageable - Full thickness tissue loss in which the base of the injury is covered by slough (yellow, tan, gray, green or brown) and/or eschar (tan, brown or black) in the wound bed.  Wound Description (Comments):   Present on Admission: Yes     Hyperlipidemia Chronic. On lipitor 80 mg every other day.   Essential hypertension Holding antihypertensives for now.  Debility, deconditioning.  Has been bed bound for 6 months.  Patient is from assisted living.  Will need skilled nursing facility likely on discharge.  DVT prophylaxis: Place and maintain sequential compression device Start: 09/19/23 1109   Disposition: Likely to skilled nursing facility when clinically improved and okay with nephrology.  Status is: Inpatient Remains inpatient appropriate because: pending clinical improvement, need for hemodialysis, new hemodialysis, A-fib with RVR,   Code Status:     Code Status: Limited: Do not attempt resuscitation (DNR) -DNR-LIMITED -Do Not Intubate/DNI   Family Communication:   I spoke with the patient's aunt on the phone on 09/24/2023 and updated her about the clinical condition of the patient.  Consultants: Vascular surgery Nephrology urology  Cardiology Palliative care  Procedures:  Abdominal Aortogram on 09-12-2023 Foley catheter placement Right internal jugular triple-lumen catheter on 09/20/2023 by interventional radiology hemodialysis  Anti-infectives:  Rocephin IV-completed  Subjective:  Today, patient was seen and examined at bedside.  Complains of weakness and fatigue.  Denies any pain, nausea, vomiting, shortness of breath or dyspnea.    Objective: Vitals:   09/25/23 0538 09/25/23 0831  BP: 95/61 108/63  Pulse: 93 98  Resp: 18   Temp: 98 F (36.7 C) 98 F  (36.7 C)  SpO2: 97% 98%    Intake/Output Summary (Last 24 hours) at 09/25/2023 1036 Last data filed at 09/25/2023 0600 Gross per 24 hour  Intake 240 ml  Output 650 ml  Net -410 ml   Filed Weights   09/23/23 1237 09/23/23 1558 09/25/23 0705  Weight: 101 kg 98.1 kg 101.6 kg   Body mass index is 29.55 kg/m.   Physical Exam:  GENERAL: Patient is alert awake and oriented. Not in obvious distress.  Obese build.  Appears weak and deconditioned, on nasal cannula oxygen  HENT: No scleral pallor or icterus. Pupils equally reactive to light. Oral mucosa is moist NECK: is supple, no gross swelling noted.  Right internal jugular hemodialysis catheter in place. CHEST:   Diminished breath sounds bilaterally. CVS: S1 and S2 heard, no murmur.  Irregular rhythm controlled ABDOMEN: Soft, non-tender, bowel sounds are present.  Foley catheter  EXTREMITIES bilateral lower extremity edema  CNS: Cranial nerves are intact.  Generalized weakness noted.  Moves all extremities. SKIN: warm and dry, left heel pressure ulceration present on admission  Data Review: I have personally reviewed the following laboratory data and studies,  CBC: Recent Labs  Lab 09/21/23 0315 09/22/23 0340 09/23/23 0237 09/24/23 0339 09/25/23 0258  WBC 8.7 7.6 7.8 8.8 8.3  HGB 7.5* 7.1* 6.9* 7.9* 8.0*  HCT 25.3* 23.9* 23.4* 27.1* 27.3*  MCV 81.4 81.8 81.8 82.9 83.5  PLT 226 266 207 279 262   Basic Metabolic Panel: Recent Labs  Lab 09/21/23 0315 09/22/23 0340 09/23/23 0237 09/24/23 0339 09/25/23 0258  NA 133*  133* 131*  131* 131* 130*  130* 130*  K 3.6  3.7 3.6  3.6 3.8 3.5  3.5 3.7  CL 97*  96* 95*  95* 94* 95*  94* 94*  CO2 26  26 26  25 27 25  25 28   GLUCOSE 98  97 107*  106* 98 102*  101* 87  BUN 55*  54* 36*  34* 44* 30*  30* 43*  CREATININE 4.40*  4.39* 3.53*  3.63* 5.31* 3.91*  3.91* 5.31*  CALCIUM 7.9*  7.8* 7.8*  7.9* 8.2* 8.1*  8.1* 8.1*  MG 1.8 1.8 1.9 1.9 2.1  PHOS 4.4 3.7  4.6 3.3 4.1   Liver Function Tests: Recent Labs  Lab 09/21/23 0315 09/22/23 0340 09/23/23 0237 09/24/23 0339 09/25/23 0258  ALBUMIN 2.0* 2.0* 2.0* 2.1* 2.0*   No results for input(s): "LIPASE", "AMYLASE" in the last 168 hours. No results for input(s): "AMMONIA" in the last 168 hours. Cardiac Enzymes: No results for input(s): "CKTOTAL", "CKMB", "CKMBINDEX", "TROPONINI" in the last 168 hours. BNP (last 3 results) No results for input(s): "BNP" in the last 8760 hours.  ProBNP (last 3 results) No results for input(s): "PROBNP" in the last 8760 hours.  CBG: No results for input(s): "GLUCAP" in the last 168 hours. No results found for this or any previous visit (from the past 240 hours).   Studies: CT CYSTOGRAM ABD/PELVIS Result Date: 09/25/2023 CLINICAL DATA:  Hematuria.  History of chronic kidney disease. EXAM: CT CYSTOGRAM (CT ABDOMEN AND PELVIS WITH CONTRAST) TECHNIQUE: Multi-detector CT imaging through the abdomen and pelvis was performed after 350 mL dilute Omnipaque 300 contrast had been introduced into the bladder for the purposes of performing CT cystography. RADIATION DOSE REDUCTION: This exam was performed according to the departmental dose-optimization program which includes automated exposure control, adjustment of the mA and/or kV according to patient size and/or use of iterative reconstruction technique. CONTRAST:  30mL OMNIPAQUE IOHEXOL 300 MG/ML  SOLN COMPARISON:  CT with IV and oral contrast 08/06/2020, PET-CT 08/14/2020. More recently a chest CT was obtained 09/19/2023. FINDINGS: Lower chest: Moderate to large bilateral pleural effusions continue to be seen, with compressive collapse of the right greater than left lower lobes versus underlying consolidation. The pleural fluid volume has not notably changed, but there is mild interstitial edema in the anterior lung bases with improvement. TAVR again noted aortic valve plane, heavy three-vessel coronary calcification. Stable  cardiomegaly. The cardiac blood pool is low in density consistent with anemia. No substantial pericardial fluid. Hepatobiliary: There is loss of fine detail due to respiratory motion, artifact from the patient's arms in the field and lack of IV contrast. No liver mass is seen grossly. The gallbladder is contracted and not well demonstrated but there is no bile duct dilatation. Pancreas: Unremarkable without contrast. Spleen: Unremarkable without contrast. No abnormality is seen through the breathing motion. Adrenals/Urinary Tract: No adrenal mass. Again noted is a 1.6 cm Bosniak 1 cyst in the upper pole of the left kidney, Hounsfield density is 20.4, and another more superiorly in the left upper pole measuring 3 cm, Hounsfield density is 19. No follow-up imaging is recommended. No other contour deforming abnormality of the unenhanced kidneys is seen. There is no urinary stone or obstruction.  There is trabeculation and scattered small diverticula of the mid to superior bladder wall, largest diverticulum is 1.5 cm to the right. There is a focal disproportionate wall thickening along the superior wall of the bladder best seen on the sagittal reformatting, on 7:89 measuring 3.8 by 1.5 cm AP and craniocaudal. On axial images is less well defined but measures about 2.7 cm AP. Mucosal mass is not excluded. Direct visualization is recommended. On the 2022 studies the bladder wall was thickened but in more generalized fashion and without asymmetries or trabeculation. Pre and postvoid imaging through the bladder revealed no evidence of bladder wall perforation or contrast extravasation. Stomach/Bowel: Moderate fold thickening in the proximal stomach is chronically seen. There is no small bowel obstruction or inflammation. The appendix is normal. There is moderate retained stool including focally in the rectum. There is dorsal perirectal stranding which could be due to dependent congestive change or stercoral proctitis.  Vascular/Lymphatic: There is moderate to heavy aortoiliac calcific plaque. No AAA. There are borderline prominent bilateral inguinal chain nodes up to 9 mm in short axis, previously about twice this size. Slightly prominent external iliac chain nodes measuring 1 cm in short axis on the left and 1.2 cm on the right, also were previously larger. There are Portocaval lymph nodes up to 1.3 cm in short axis, also previously larger. Reproductive: No prostatomegaly. Other: Mild body wall anasarca appears similar to the prior study. There is mild rectus diastasis and outward protrusion containing nonobstructed small bowel loops. There is no incarcerated hernia. Minimal pelvic ascites. No other free fluid. No free air. Musculoskeletal: There is dextroscoliosis and advanced degenerative changes of the lumbar spine and advanced degenerative change in the lower thoracic spine. 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 Bridging osteophytes both anterior SI joints. IMPRESSION: 1. 3.8 x 1.5 x 2.7 cm focal disproportionate wall thickening along the superior wall of the bladder. Mucosal mass is not excluded. Direct visualization is recommended. 2. Trabeculation and scattered small diverticula of the mid to superior bladder wall. No bladder wall perforation or contrast extravasation. 3. No urinary stone or obstruction. 4. Moderate to large bilateral pleural effusions with compressive collapse of the right greater than left lower lobes versus underlying consolidation. The pleural fluid volume has not notably changed from April 1, but there is mild interstitial edema in the anterior lung bases with improvement. 5. Cardiomegaly with TAVR.  Likely anemia. 6. Aortic and coronary artery atherosclerosis. 7. Chronic fold thickening in the proximal stomach. 8. Moderate retained stool including focally in the rectum with dorsal perirectal stranding which could be due to dependent congestive change or  stercoral proctitis. 9. Mild body wall anasarca and minimal pelvic ascites. 10. 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 11. Borderline prominent inguinal, external iliac, and Portocaval lymph nodes, all of which were previously larger. Electronically Signed   By: Almira Bar M.D.   On: 09/25/2023 05:04   DG CHEST PORT 1 VIEW Result Date: 09/24/2023 CLINICAL DATA:  Follow-up pulmonary edema. EXAM: PORTABLE CHEST 1 VIEW COMPARISON:  Chest radiograph and CT 09/19/2023 FINDINGS: Right-sided central line tip overlies the atrial caval junction. Moderate bilateral pleural effusions, right greater than left, with slight improvement from prior exam. Similar interstitial thickening likely pulmonary edema. Unchanged cardiomegaly. TAVR. No pneumothorax. IMPRESSION: 1. Moderate bilateral pleural effusions, right greater than left, with slight improvement from prior exam. 2. Similar interstitial thickening likely pulmonary edema. 3. Unchanged cardiomegaly. Electronically Signed   By: Ivette Loyal.D.  On: 09/24/2023 10:06       Joycelyn Das, MD  [facility] 09/25/2023  If 7PM-7AM, please contact night-coverage

## 2023-09-25 NOTE — Progress Notes (Addendum)
 Patient Name: Jordan [name] Date of Encounter: 09/25/2023 [facility] Cardiologist: Donato Schultz, MD   Interval Summary  .    73 yr old male with PMH of non-obstructive CAD, HTN, HLD,  HFpEF, persistent A fib, aortic stenosis s/p TAVR 06/30/2020, chronic oxygen dependent COPD, tobacco use, stroke, type 2 DM, OSA, chronic venous insufficiency, recurrent cellulitis, chronic anemia, anxiety with depression, who has been hospitalized here for 24 days for left leg cellulitis completed antibiotic, course complicated by AKI that ultimately started on hemodialysis by nephrology, acute on chronic anemia requiring PRBC transfusion. Cardiology is consulted on 09/24/23 for A fib RVR, complicated by hypotension, he was resumed on PTA metoprolol for rate control and remained on midodrine for BP support. Palliative care had been consulted, patient is DNR/DNI.   Patient is confused and brings up off topic during encounter, according to his wife confusion seems worse. He wants grilled cheese for lunch. He states sometimes he can get SOB from his A fib, feels overall OK today, then also said maybe somewhat SOB today. He has no chest pain.    Vital Signs .    Vitals:   09/24/23 2059 09/25/23 0538 09/25/23 0705 09/25/23 0831  BP: 104/64 95/61  108/63  Pulse: 96 93  98  Resp: 20 18    Temp: 98.2 F (36.8 C) 98 F (36.7 C)  98 F (36.7 C)  TempSrc: Oral Oral  Oral  SpO2: 100% 97%  98%  Weight:   101.6 kg   Height:        Intake/Output Summary (Last 24 hours) at 09/25/2023 1024 Last data filed at 09/25/2023 0600 Gross per 24 hour  Intake 240 ml  Output 650 ml  Net -410 ml      09/25/2023    7:05 AM 09/23/2023    3:58 PM 09/23/2023   12:37 PM  Last 3 Weights  Weight (lbs) 223 lb 15.8 oz 216 lb 4.3 oz 222 lb 10.6 oz  Weight (kg) 101.6 kg 98.1 kg 101 kg      Telemetry/ECG    A fib with ventricular rate of 80-90s, occasional PVC - Personally Reviewed  Physical Exam .   GEN: No acute  distress.   Neck: no JVD Cardiac: Irregularly irregular,  no murmurs, rubs, or gallops.  Respiratory: Clear but decreased at base to auscultation bilaterally. On 2L [place] oxygen.  GI: Soft, nontender, non-distended  MS: chronic venous stasis change of BLE, no pitting edema noted today   Assessment & Plan .     Persistent A fib with RVR - on PTA atenolol and Eliquis historically, atenolol had been held due to hypotension requiring midodrine and Eliquis had been held due to worsening anemia requiring transfusion this admission, A fib RVR 110s yesterday in the setting of anemia/decondition/prolonged hospitalization, TSH elevated 5.9, Echo from 3/21 showed LVEF 60-65%, would continue rate control strategy and treat underlying medical issues  - metoprolol 25mg  BID added since 09/24/23, rate control is adequate, would not be too aggressive for rate control with ongoing medical issues  - transfuse to keep Hgb >7 at least  - keep Mag >2 and K >4  - maintain adequate hydration, treat infection if presents - resume Eliquis when no contraindication per primary team   Chronic HFpEF - He was felt volume overloaded initially and given high dose IV Lasix, later developed AKI and ultimately become hemodialysis dependent now  - Echo from 09/08/23 with LVEF 60-65%, normal TAVR valve function  -  Most recent CXR from 4/6 showed moderate bilateral pleural effusions, right greater than left, with slight improvement from prior exam. Similar interstitial thickening likely pulmonary edema.  - clinically he is chronically deconditioned and frail, he does not appears overt volume overloaded, warm and well perfused not consistent with low outpatient HF - continue dialysis for volume management, consider IV iron therapy if indicated  - GDMT: limited by hypotension and renal failure   Hx of aortic stenosis s/p TAVR 2022  - Echo from 09/08/23 showed LVEF 60-65% and normal prosthetic aortic valve function per review of Dr Rennis Golden    Nonobstructive coronary artery disease  - no chest pain  - continued on PTA lipitor 80mg  daily, not on ASA PTA due to DOAC use  AKI Leg cellulitis Hematuria Anemia  COPD Chronic venous stasis  Debility Confusion - per primary team      For questions or updates, please contact [facility] Please consult [email or web address] for contact info under     Signed, Cyndi Bender, NP   ADDENDUM:   Patient seen and examined with Daphene Calamity.  I personally taken a history, examined the patient, reviewed relevant notes,  laboratory data / imaging studies.  I performed a substantive portion of this encounter and formulated the important aspects of the plan.  I agree with the APP's note, impression, and recommendations; however, I have edited the note to reflect changes or salient points.   Resting in bed, no acute distress Family  at bedside  No chest pain or heart failure symptoms.    PHYSICAL EXAM: Today's Vitals   09/24/23 2059 09/25/23 0538 09/25/23 0705 09/25/23 0831  BP: 104/64 95/61  108/63  Pulse: 96 93  98  Resp: 20 18    Temp: 98.2 F (36.8 C) 98 F (36.7 C)  98 F (36.7 C)  TempSrc: Oral Oral  Oral  SpO2: 100% 97%  98%  Weight:   101.6 kg   Height:      PainSc:    0-No pain   Body mass index is 29.55 kg/m.   Net IO Since Admission: -6,133.55 mL [09/25/23 1426]  Filed Weights   09/23/23 1237 09/23/23 1558 09/25/23 0705  Weight: 101 kg 98.1 kg 101.6 kg    Physical Exam  Constitutional: He appears chronically ill.  hemodynamically stable, no acute distress.   HENT:  Dry mucus membrane   Neck: No JVD present.  Right subclavian HD catheter  Cardiovascular: Normal rate, S1 normal and S2 normal. An irregularly irregular rhythm present. Exam reveals no gallop and no friction rub.  No murmur heard. Pulmonary/Chest: Breath sounds normal. He has no wheezes. He has no rales. He exhibits no tenderness.  Abdominal: Soft. Bowel sounds are normal. He exhibits  no distension. There is no abdominal tenderness.  Urinary foley present - gross hematuria.   Musculoskeletal:        General: Edema (trace bilaterally) present. No tenderness.     Comments: Chronic skin changes bilateral leg - suggestive of venous insufficiency/cellulitis    Neurological:  Alert to him,DOB, president. Not location.   Skin: Skin is warm and dry.    EKG: (personally reviewed by me) No new tracing.   Telemetry: (personally reviewed by me) Afib w/ controlled ventricular rate    Impression / Recommendation:  Persistent Afib, now cVR  Cardiology consulted during his hospitalization for A-fib with RV R-likely physiologic in the setting of bilateral leg cellulitis, hematuria, anemia, etc. Rate control: Metoprolol 25mg  po bid, was on  a atenolol at home but was held secondary to hypotension requiring midodrine support requiring midodrine support Rhythm control: NA Thromboembolic prophylaxis: Eliquis (now held due to anemia, hematuria, needing PRBC's) Recommend rate control strategy for now as the risk of bleeding outweighs the benefit. Once cleared by other disciplines of medicine would recommend reinitiating anticoagulation when it safe. TSH 5.9.  Chronic heart failure with preserved EF: Stage C, NYHA class II/III Echocardiogram March 2025: LVEF 60 to 65% Has developed acute kidney injury during this hospitalization and currently on hemodialysis-will defer volume management to dialysis for now. GDMT: Difficult to uptitrate due to hypotension, needing midodrine support, and AKI on hemodialysis. Monitor for now.    Hx of AS s/p TAVR - will need antibiotic prophylaxis.  AKI on HD - nephrology following   Hyponatremia: management per nephrology.   Hematuria / s/p PRBC's / Anemia: Hb as low as 6.7g/dL. management per primary and urology.   Palliative care is following - very appropriate given his clinical trajectory.    Further recommendations to follow as the case  evolves.   This note was created using a voice recognition software as a result there may be grammatical errors inadvertently enclosed that do not reflect the nature of this encounter. Every attempt is made to correct such errors.   Tessa Lerner, DO, [facility]  [facility]  [address] [place], [place] [address] Pager: [phone] Office: [phone] 09/25/2023 2:26 PM

## 2023-09-25 NOTE — Progress Notes (Signed)
 [facility] Progress Note  Subjective:  Seen in room Appears disoriented Aunt at bedside   Vitals:   09/24/23 2059 09/25/23 0538 09/25/23 0705 09/25/23 0831  BP: 104/64 95/61  108/63  Pulse: 96 93  98  Resp: 20 18    Temp: 98.2 F (36.8 C) 98 F (36.7 C)  98 F (36.7 C)  TempSrc: Oral Oral  Oral  SpO2: 100% 97%  98%  Weight:   101.6 kg   Height:        Exam: Gen: NAD, [place] O2 CVS: tachy Resp: CTA Abd: +BS, soft, NT/ND Ext: still has 1+ bilat hip edema > pretib edema, also UE edema  Assessment/ Plan: AKI: b/l creat is 1.3, from PCP's office in Jan 2024. Assuming this is AKI but could also have CKD. UA shows some rbc's/ wbc's, no proteinuria. Renal US w/o obstruction. BP's were soft in 90s on presentation and then improved after IV abx and IVF's, but pt developed pulm edema on CXR #2 w/ bilat LE edema. Suspect AKI due to CHF/ cardiorenal. Started IV lasix 3/19 and titrated up the dose. Diuresed well x several days then BP dropped 3/23 so Lasix was decreased to 80 mg IV tid, then stopped.  UOP dropped but Scr stable. A renal CO2 angiogram was negative for renal artery stenosis. Then lasix was started po w/ increased UOP but unfortunately the Scr rose up to 3 then 4 then 6.  Repeat renal US showed no hydro, exam and CXR showed persistent fluid overload. IR placed temp cath 4/02. Pt was started on iHD 4/2, and got his 2nd HD 4/3 and 3rd session Saturday.  - next HD 09/26/23  - he is wheelchair/ bedbound at baseline- he is a very poor long-term dialysis candidate  - appreciate GOC Volume - diffuse LE edema and +CHF on all CXR's. Volume overload improving on exam, wt's are coming down. Cont to lower vol w/ HD. CXR today showing improvement (poor film).  BP - bp's soft, metoprolol was stopped. Tolerating UF w/ HD.  Atrial fib - metoprolol dc'd due to soft BP's. HR's 100-120s, cardiology consulting.  Gross hematuria: appreciate urology assistance.  Anemia - hgb down to 6.7.  PRBC's ordered for today.  LLE cellulitis: SP IV abx then keflex, now off abx COPD/ chronic resp failure - on home O2 2 L S/p TAVR Disposition lives at [facility] MD 09/21/2023, 10:04 AM  Recent Labs  Lab 09/20/23 0355 09/20/23 1212 09/21/23 0315  HGB  --  8.0* 7.5*  ALBUMIN 2.1*  --  2.0*  CALCIUM 8.1*  --  7.9*  7.8*  PHOS 7.6*  --  4.4  CREATININE 6.23*  --  4.40*  4.39*  K 4.2  --  3.6  3.7    Inpatient medications:  atorvastatin  80 mg Oral QODAY   Chlorhexidine Gluconate Cloth  6 each Topical Q0600   Chlorhexidine Gluconate Cloth  6 each Topical Q0600   LORazepam  1 mg Oral BID   mometasone-formoterol  2 puff Inhalation BID   multivitamin with minerals  1 tablet Oral Q breakfast   pantoprazole  40 mg Oral Daily   sevelamer carbonate  800 mg Oral TID WC   sodium chloride flush  3 mL Intravenous Q12H   venlafaxine XR  75 mg Oral Q breakfast    albumin human     cefTRIAXone (ROCEPHIN)  IV 2 g (09/20/23 1311)   acetaminophen **OR** acetaminophen, acetaminophen, albumin human, albuterol,  alum & mag hydroxide-simeth, camphor-menthol, hydrALAZINE, melatonin, ondansetron (ZOFRAN) IV, mouth rinse, oxyCODONE, polyethylene glycol, sodium chloride flush, traMADol

## 2023-09-25 NOTE — Progress Notes (Signed)
 13 Days Post-Op Subjective: NAEON. Pleasant with no specific complaints.   Objective: Vital signs in last 24 hours: Temp:  [97.7 F (36.5 C)-98.2 F (36.8 C)] 98 F (36.7 C) (04/07 0831) Pulse Rate:  [93-98] 98 (04/07 0831) Resp:  [17-20] 18 (04/07 0538) BP: (95-109)/(60-64) 108/63 (04/07 0831) SpO2:  [91 %-100 %] 98 % (04/07 0831) Weight:  [101.6 kg] 101.6 kg (04/07 0705)  Assessment/Plan: #hematuria Eliquis held  Pt cannot tolerate CT hematuria d/t kidney injury.  His UA was not remarkable on 3/27, though there was rare bacteria. Has now completed Rocephin Trend labs. 1u PRBC given 4/5. HgB stable since.  CT cystogram reveals 3.8 x 1.5 x 2.7 cm focal disproportionate wall thickening along the superior wall of the bladder, trabeculation with scattered small diverticulum. Outpatient cystoscopy. Over 59y smoking hx. Improving lymphadenopathy. Pending palliative/goals of care conversation. Will follow along peripherally pending this meeting.    Intake/Output from previous day: 04/06 0701 - 04/07 0700 In: 240 [P.O.:240] Out: 650 [Urine:650]  Intake/Output this shift: No intake/output data recorded.  Physical Exam:  General: Older than stated age, confused CV: No cyanosis Lungs: equal chest rise Abdomen: Soft, NTND, no rebound or guarding Gu: condom catheter in place with light red urine  Lab Results: Recent Labs    09/23/23 0237 09/24/23 0339 09/25/23 0258  HGB 6.9* 7.9* 8.0*  HCT 23.4* 27.1* 27.3*   BMET Recent Labs    09/24/23 0339 09/25/23 0258  NA 130*  130* 130*  K 3.5  3.5 3.7  CL 95*  94* 94*  CO2 25  25 28   GLUCOSE 102*  101* 87  BUN 30*  30* 43*  CREATININE 3.91*  3.91* 5.31*  CALCIUM 8.1*  8.1* 8.1*     Studies/Results: CT CYSTOGRAM ABD/PELVIS Result Date: 09/25/2023 CLINICAL DATA:  Hematuria.  History of chronic kidney disease. EXAM: CT CYSTOGRAM (CT ABDOMEN AND PELVIS WITH CONTRAST) TECHNIQUE: Multi-detector CT imaging through  the abdomen and pelvis was performed after 350 mL dilute Omnipaque 300 contrast had been introduced into the bladder for the purposes of performing CT cystography. RADIATION DOSE REDUCTION: This exam was performed according to the departmental dose-optimization program which includes automated exposure control, adjustment of the mA and/or kV according to patient size and/or use of iterative reconstruction technique. CONTRAST:  30mL OMNIPAQUE IOHEXOL 300 MG/ML  SOLN COMPARISON:  CT with IV and oral contrast 08/06/2020, PET-CT 08/14/2020. More recently a chest CT was obtained 09/19/2023. FINDINGS: Lower chest: Moderate to large bilateral pleural effusions continue to be seen, with compressive collapse of the right greater than left lower lobes versus underlying consolidation. The pleural fluid volume has not notably changed, but there is mild interstitial edema in the anterior lung bases with improvement. TAVR again noted aortic valve plane, heavy three-vessel coronary calcification. Stable cardiomegaly. The cardiac blood pool is low in density consistent with anemia. No substantial pericardial fluid. Hepatobiliary: There is loss of fine detail due to respiratory motion, artifact from the patient's arms in the field and lack of IV contrast. No liver mass is seen grossly. The gallbladder is contracted and not well demonstrated but there is no bile duct dilatation. Pancreas: Unremarkable without contrast. Spleen: Unremarkable without contrast. No abnormality is seen through the breathing motion. Adrenals/Urinary Tract: No adrenal mass. Again noted is a 1.6 cm Bosniak 1 cyst in the upper pole of the left kidney, Hounsfield density is 20.4, and another more superiorly in the left upper pole measuring 3 cm, Hounsfield density is  19. No follow-up imaging is recommended. No other contour deforming abnormality of the unenhanced kidneys is seen. There is no urinary stone or obstruction. There is trabeculation and scattered  small diverticula of the mid to superior bladder wall, largest diverticulum is 1.5 cm to the right. There is a focal disproportionate wall thickening along the superior wall of the bladder best seen on the sagittal reformatting, on 7:89 measuring 3.8 by 1.5 cm AP and craniocaudal. On axial images is less well defined but measures about 2.7 cm AP. Mucosal mass is not excluded. Direct visualization is recommended. On the 2022 studies the bladder wall was thickened but in more generalized fashion and without asymmetries or trabeculation. Pre and postvoid imaging through the bladder revealed no evidence of bladder wall perforation or contrast extravasation. Stomach/Bowel: Moderate fold thickening in the proximal stomach is chronically seen. There is no small bowel obstruction or inflammation. The appendix is normal. There is moderate retained stool including focally in the rectum. There is dorsal perirectal stranding which could be due to dependent congestive change or stercoral proctitis. Vascular/Lymphatic: There is moderate to heavy aortoiliac calcific plaque. No AAA. There are borderline prominent bilateral inguinal chain nodes up to 9 mm in short axis, previously about twice this size. Slightly prominent external iliac chain nodes measuring 1 cm in short axis on the left and 1.2 cm on the right, also were previously larger. There are Portocaval lymph nodes up to 1.3 cm in short axis, also previously larger. Reproductive: No prostatomegaly. Other: Mild body wall anasarca appears similar to the prior study. There is mild rectus diastasis and outward protrusion containing nonobstructed small bowel loops. There is no incarcerated hernia. Minimal pelvic ascites. No other free fluid. No free air. Musculoskeletal: There is dextroscoliosis and advanced degenerative changes of the lumbar spine and advanced degenerative change in the lower thoracic spine. 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 Bridging osteophytes both anterior SI joints. IMPRESSION: 1. 3.8 x 1.5 x 2.7 cm focal disproportionate wall thickening along the superior wall of the bladder. Mucosal mass is not excluded. Direct visualization is recommended. 2. Trabeculation and scattered small diverticula of the mid to superior bladder wall. No bladder wall perforation or contrast extravasation. 3. No urinary stone or obstruction. 4. Moderate to large bilateral pleural effusions with compressive collapse of the right greater than left lower lobes versus underlying consolidation. The pleural fluid volume has not notably changed from April 1, but there is mild interstitial edema in the anterior lung bases with improvement. 5. Cardiomegaly with TAVR.  Likely anemia. 6. Aortic and coronary artery atherosclerosis. 7. Chronic fold thickening in the proximal stomach. 8. Moderate retained stool including focally in the rectum with dorsal perirectal stranding which could be due to dependent congestive change or stercoral proctitis. 9. Mild body wall anasarca and minimal pelvic ascites. 10. 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 11. Borderline prominent inguinal, external iliac, and Portocaval lymph nodes, all of which were previously larger. Electronically Signed   By: Almira Bar M.D.   On: 09/25/2023 05:04   DG CHEST PORT 1 VIEW Result Date: 09/24/2023 CLINICAL DATA:  Follow-up pulmonary edema. EXAM: PORTABLE CHEST 1 VIEW COMPARISON:  Chest radiograph and CT 09/19/2023 FINDINGS: Right-sided central line tip overlies the atrial caval junction. Moderate bilateral pleural effusions, right greater than left, with slight improvement from prior exam. Similar interstitial thickening likely pulmonary edema. Unchanged cardiomegaly. TAVR. No pneumothorax. IMPRESSION: 1. Moderate bilateral pleural effusions, right greater than left, with slight  improvement from prior exam. 2. Similar interstitial thickening likely pulmonary  edema. 3. Unchanged cardiomegaly. Electronically Signed   By: Narda Rutherford M.D.   On: 09/24/2023 10:06      LOS: 24 days   Elmon Kirschner, NP [facility] Pager: [phone]  09/25/2023, 12:22 PM

## 2023-09-25 NOTE — TOC Progression Note (Signed)
 Transition of Care [facility]) - Progression Note    Patient Details  Name: Jordan [name] MRN: [id number] Date of Birth: Sep 10, 1950  [facility]) CM/SW Contact  Michaela Corner, [place] Phone Number: 09/25/2023, 4:04 PM  Clinical Narrative:   CSW inquired with MD and Nephrology regarding medical stability. Per MD, will depend on HD plan. CSW to follow up on bed availability at [facility] and submit for insurance auth.   TOC will continue to follow.    Expected Discharge Plan: Assisted Living Barriers to Discharge: Continued Medical Work up  Expected Discharge Plan and Services In-house Referral: Clinical Social Work     Living arrangements for the past 2 months: Assisted Living Facility                                       Social Determinants of Health (SDOH) Interventions SDOH Screenings   Food Insecurity: Patient Declined (09/01/2023)  Housing: Patient Declined (09/01/2023)  Transportation Needs: Patient Declined (09/01/2023)  Utilities: Patient Declined (09/01/2023)  Social Connections: Patient Declined (09/01/2023)  Tobacco Use: High Risk (09/01/2023)    Readmission Risk Interventions    09/04/2023    2:49 PM 04/04/2022   11:02 AM  Readmission Risk Prevention Plan  Transportation Screening Complete Complete  PCP or Specialist Appt within 5-7 Days Complete   PCP or Specialist Appt within 3-5 Days  Complete  Home Care Screening Complete   Medication Review (RN CM) Complete   HRI or Home Care Consult  Complete  Social Work Consult for Recovery Care Planning/Counseling  Complete  Palliative Care Screening  Not Applicable  Medication Review Oceanographer)  Complete

## 2023-09-25 NOTE — Plan of Care (Signed)
  Problem: Health Behavior/Discharge Planning: Goal: Ability to manage health-related needs will improve Outcome: Progressing   Problem: Clinical Measurements: Goal: Ability to maintain clinical measurements within normal limits will improve Outcome: Progressing Goal: Will remain free from infection Outcome: Progressing Goal: Diagnostic test results will improve Outcome: Progressing Goal: Respiratory complications will improve Outcome: Progressing Goal: Cardiovascular complication will be avoided Outcome: Progressing   Problem: Activity: Goal: Risk for activity intolerance will decrease Outcome: Progressing   Problem: Nutrition: Goal: Adequate nutrition will be maintained Outcome: Progressing   Problem: Elimination: Goal: Will not experience complications related to bowel motility Outcome: Progressing Goal: Will not experience complications related to urinary retention Outcome: Progressing   Problem: Pain Managment: Goal: General experience of comfort will improve and/or be controlled Outcome: Progressing   Problem: Safety: Goal: Ability to remain free from injury will improve Outcome: Progressing   Problem: Skin Integrity: Goal: Risk for impaired skin integrity will decrease Outcome: Progressing   Problem: Clinical Measurements: Goal: Ability to avoid or minimize complications of infection will improve Outcome: Progressing   Problem: Skin Integrity: Goal: Skin integrity will improve Outcome: Progressing   Problem: Education: Goal: Understanding of CV disease, CV risk reduction, and recovery process will improve Outcome: Progressing Goal: Individualized Educational Video(s) Outcome: Progressing   Problem: Activity: Goal: Ability to return to baseline activity level will improve Outcome: Progressing   Problem: Cardiovascular: Goal: Ability to achieve and maintain adequate cardiovascular perfusion will improve Outcome: Progressing Goal: Vascular access  site(s) Level 0-1 will be maintained Outcome: Progressing   Problem: Health Behavior/Discharge Planning: Goal: Ability to safely manage health-related needs after discharge will improve Outcome: Progressing

## 2023-09-25 NOTE — Progress Notes (Signed)
   Palliative Medicine Inpatient Follow Up Note HPI: Jordan [name] is an 73 y.o. male with past medical history of hypertension, heart failure with preserved ejection fraction, atrial fibrillation, CAD, COPD with chronic hypoxic respiratory failure on 2 L of oxygen was brought into the hospital from skilled nursing facility with worsening left leg wound. Palliative care has been asked to support additional goals of care conversations in the setting of recent initiation of hemodialysis.   Today's Discussion 09/25/2023  *Please note that this is a verbal dictation therefore any spelling or grammatical errors are due to the "Dragon Medical One" system interpretation.  Chart reviewed inclusive of vital signs, progress notes, laboratory results, and diagnostic images.   I met with Jordan [name] at bedside this morning. He is in good spirits and shares that he was able to eat all of his breakfast.  Created space and opportunity for patient to explore thoughts feelings and fears regarding current medical situation. He and I discussed that he still has hopes of improvement. He understands the long and hard road he has ahead of him. He would like to try to improve.   We discussed advanced directives. He notes that his family was quite tired yesterday and they plan to complete these at another time when they visit.   Have called patients cousin, Jordan [name] though reached VM. Plan to try again later.   Questions and concerns addressed/Palliative Support Provided.   Objective Assessment: Vital Signs Vitals:   09/25/23 0538 09/25/23 0831  BP: 95/61 108/63  Pulse: 93 98  Resp: 18   Temp: 98 F (36.7 C) 98 F (36.7 C)  SpO2: 97% 98%    Intake/Output Summary (Last 24 hours) at 09/25/2023 1012 Last data filed at 09/25/2023 0600 Gross per 24 hour  Intake 240 ml  Output 650 ml  Net -410 ml   Last Weight  [name] recent update: 09/25/2023  8:42 AM    Weight  101.6 kg (223 lb 15.8 oz)            Gen:  Elderly Caucasian male chronically ill in appearance HEENT: moist mucous membranes CV: Irregular rate and rhythm PULM: On 2 L nasal cannula breathing is even and unlabored ABD: soft/nontender EXT: Generalized edema Neuro: Alert and oriented to self and place though at times disoriented  SUMMARY OF RECOMMENDATIONS   DNAR/DNI   [name] / DNR Form Completed, paper copy placed onto the chart electric copy can be found in [employer]   Reviewed advanced directives with patient and his family - plan to complete these while he is hospitalized    Ongoing palliative care support  Billing based on MDM: Moderate  ______________________________________________________________________________________ Jordan [name] Jordan [name] Palliative Medicine Team Team Cell Phone: [phone] Please utilize secure chat with additional questions, if there is no response within 30 minutes please call the above phone number  Palliative Medicine Team providers are available by phone from 7am to 7pm daily and can be reached through the team cell phone.  Should this patient require assistance outside of these hours, please call the patient's attending physician.

## 2023-09-26 DIAGNOSIS — Z515 Encounter for palliative care: Secondary | ICD-10-CM | POA: Diagnosis not present

## 2023-09-26 DIAGNOSIS — L03116 Cellulitis of left lower limb: Secondary | ICD-10-CM | POA: Diagnosis not present

## 2023-09-26 DIAGNOSIS — I5032 Chronic diastolic (congestive) heart failure: Secondary | ICD-10-CM | POA: Diagnosis not present

## 2023-09-26 DIAGNOSIS — I872 Venous insufficiency (chronic) (peripheral): Secondary | ICD-10-CM | POA: Diagnosis not present

## 2023-09-26 DIAGNOSIS — I4819 Other persistent atrial fibrillation: Secondary | ICD-10-CM | POA: Diagnosis not present

## 2023-09-26 DIAGNOSIS — Z7189 Other specified counseling: Secondary | ICD-10-CM | POA: Diagnosis not present

## 2023-09-26 DIAGNOSIS — N179 Acute kidney failure, unspecified: Secondary | ICD-10-CM | POA: Diagnosis not present

## 2023-09-26 LAB — RENAL FUNCTION PANEL
Albumin: 2 g/dL — ABNORMAL LOW (ref 3.5–5.0)
Anion gap: 10 (ref 5–15)
BUN: 53 mg/dL — ABNORMAL HIGH (ref 8–23)
CO2: 26 mmol/L (ref 22–32)
Calcium: 8.4 mg/dL — ABNORMAL LOW (ref 8.9–10.3)
Chloride: 95 mmol/L — ABNORMAL LOW (ref 98–111)
Creatinine, Ser: 6.17 mg/dL — ABNORMAL HIGH (ref 0.61–1.24)
GFR, Estimated: 9 mL/min — ABNORMAL LOW (ref >60)
Glucose, Bld: 94 mg/dL (ref 70–99)
Phosphorus: 4.3 mg/dL (ref 2.5–4.6)
Potassium: 3.7 mmol/L (ref 3.5–5.1)
Sodium: 131 mmol/L — ABNORMAL LOW (ref 135–145)

## 2023-09-26 LAB — CBC
HCT: 25.4 % — ABNORMAL LOW (ref 39.0–52.0)
Hemoglobin: 7.5 g/dL — ABNORMAL LOW (ref 13.0–17.0)
MCH: 24.6 pg — ABNORMAL LOW (ref 26.0–34.0)
MCHC: 29.5 g/dL — ABNORMAL LOW (ref 30.0–36.0)
MCV: 83.3 fL (ref 80.0–100.0)
Platelets: 278 10*3/uL (ref 150–400)
RBC: 3.05 MIL/uL — ABNORMAL LOW (ref 4.22–5.81)
RDW: 24.2 % — ABNORMAL HIGH (ref 11.5–15.5)
WBC: 7.8 10*3/uL (ref 4.0–10.5)
nRBC: 0 % (ref 0.0–0.2)

## 2023-09-26 MED ORDER — ANTICOAGULANT SODIUM CITRATE 4% (200MG/5ML) IV SOLN: 5.0000 mL | Status: DC | PRN

## 2023-09-26 MED ORDER — LIDOCAINE-PRILOCAINE 2.5-2.5 % EX CREA: 1.0000 | TOPICAL_CREAM | CUTANEOUS | Status: DC | PRN

## 2023-09-26 MED ORDER — PENTAFLUOROPROP-TETRAFLUOROETH EX AERO: 1.0000 | INHALATION_SPRAY | CUTANEOUS | Status: DC | PRN

## 2023-09-26 MED ORDER — NEPRO/CARBSTEADY PO LIQD: 237.0000 mL | ORAL | Status: DC | PRN

## 2023-09-26 MED ORDER — HEPARIN SODIUM (PORCINE) 1000 UNIT/ML DIALYSIS: 1000.0000 [IU] | INTRAMUSCULAR | Status: DC | PRN

## 2023-09-26 MED ORDER — HEPARIN SODIUM (PORCINE) 1000 UNIT/ML DIALYSIS: 2000.0000 [IU] | INTRAMUSCULAR | Status: DC | PRN

## 2023-09-26 MED ORDER — HEPARIN SODIUM (PORCINE) 1000 UNIT/ML IJ SOLN
3200.0000 [IU] | Freq: Once | INTRAMUSCULAR | Status: AC
  Administered 2023-09-26: 3200 [IU]
  Filled 2023-09-26: qty 4

## 2023-09-26 MED ORDER — HEPARIN SODIUM (PORCINE) 1000 UNIT/ML DIALYSIS
2000.0000 [IU] | Freq: Once | INTRAMUSCULAR | Status: AC
  Administered 2023-09-26: 2000 [IU] via INTRAVENOUS_CENTRAL
  Filled 2023-09-26: qty 2

## 2023-09-26 MED ORDER — LIDOCAINE HCL (PF) 1 % IJ SOLN: 5.0000 mL | INTRAMUSCULAR | Status: DC | PRN

## 2023-09-26 MED ORDER — ALTEPLASE 2 MG IJ SOLR: 2.0000 mg | Freq: Once | INTRAMUSCULAR | Status: DC | PRN

## 2023-09-26 NOTE — Progress Notes (Incomplete)
 Patient Name: Jordan [name] Date of Encounter: 09/26/2023 [facility] Cardiologist: Donato Schultz, MD   Interval Summary  .    73 yr old male with PMH of non-obstructive CAD, HTN, HLD,  HFpEF, persistent A fib, aortic stenosis s/p TAVR 06/30/2020, chronic oxygen dependent COPD, tobacco use, stroke, type 2 DM, OSA, chronic venous insufficiency, recurrent cellulitis, chronic anemia, anxiety with depression, who has been hospitalized here for 25 days for left leg cellulitis completed antibiotic, course complicated by AKI that ultimately started on hemodialysis by nephrology, acute on chronic anemia requiring PRBC transfusion. Cardiology is consulted on 09/24/23 for A fib RVR, complicated by hypotension, he was resumed on PTA metoprolol for rate control and remained on midodrine for BP support. Palliative care had been consulted, patient is DNR/DNI.   Patient is at dialysis, he is a little sleepy, awakened to loud voice, states he feels OK, denied chest pain or SOB.   Vital Signs .    Vitals:   09/25/23 2021 09/25/23 2034 09/26/23 0451 09/26/23 0700  BP:  98/65 (!) 105/48 92/61  Pulse:  95 79 83  Resp:  18 18   Temp:  98.1 F (36.7 C) 98.2 F (36.8 C) 97.6 F (36.4 C)  TempSrc:    Axillary  SpO2: 94% 96% 98% 100%  Weight:      Height:        Intake/Output Summary (Last 24 hours) at 09/26/2023 5784 Last data filed at 09/26/2023 0630 Gross per 24 hour  Intake 240 ml  Output 500 ml  Net -260 ml      09/25/2023    7:05 AM 09/23/2023    3:58 PM 09/23/2023   12:37 PM  Last 3 Weights  Weight (lbs) 223 lb 15.8 oz 216 lb 4.3 oz 222 lb 10.6 oz  Weight (kg) 101.6 kg 98.1 kg 101 kg      Telemetry/ECG    A fib with ventricular rate of 90s, PVCs  - Personally Reviewed  Physical Exam .   GEN: No acute distress.   Neck: No JVD Cardiac: Irregularly irregular, no murmurs, rubs, or gallops.  Respiratory: Clear to auscultation bilaterally. On 3L [place] oxygen  GI: Soft, nontender,  non-distended  MS: No leg edema   Assessment & Plan .     Persistent A fib with RVR - on PTA atenolol and Eliquis historically, atenolol had been held due to hypotension requiring midodrine and Eliquis had been held due to worsening anemia requiring transfusion and hematuira this admission, A fib RVR 110s 09/24/23 in the setting of worsening anemia/decondition/prolonged hospitalization, TSH elevated 5.9, Echo from 3/21 showed LVEF 60-65%, would continue rate control strategy and treat underlying medical issues  - metoprolol 25mg  BID added since 09/24/23, rate control is adequate, would not be too aggressive for rate control with ongoing medical issues  - transfuse to keep Hgb >7 at least  - keep Mag >2 and K >4  - maintain adequate hydration, treat infection if presents - resume Eliquis when no contraindication per primary team    Chronic HFpEF - He was felt volume overloaded initially and given high dose IV Lasix, later developed AKI and ultimately become hemodialysis dependent now  - Echo from 09/08/23 with LVEF 60-65%, normal TAVR valve function  - Most recent CXR from 4/6 showed moderate bilateral pleural effusions, right greater than left, with slight improvement from prior exam. Similar interstitial thickening likely pulmonary edema.  - clinically he is chronically deconditioned and frail, he does not  appears overt volume overloaded, extremities are warm and well perfused. - continue dialysis for volume management, consider IV iron therapy if indicated  - GDMT: limited by hypotension and renal failure    Hx of aortic stenosis s/p TAVR 2022  - Echo from 09/08/23 showed LVEF 60-65% and normal prosthetic aortic valve function per review of Dr Rennis Golden    Nonobstructive coronary artery disease  - no chest pain  - continued on PTA lipitor 80mg  daily, not on ASA PTA due to DOAC use   AKI Leg cellulitis Hematuria Anemia  COPD Chronic venous stasis  Debility Confusion - per primary team      For questions or updates, please contact [facility] Please consult [email or web address] for contact info under     Signed, Cyndi Bender, NP   ADDENDUM:   Patient seen and examined. I personally taken a history, examined the patient, reviewed relevant notes,  laboratory data / imaging studies.  I performed a substantive portion of this encounter and formulated the important aspects of the plan.  I agree with the APP's note, impression, and recommendations; however, I have edited the note to reflect changes or salient points.   Patient seen and examined in dialysis. Patient denies anginal chest pain or heart failure symptoms. No events overnight  PHYSICAL EXAM: Today's Vitals   09/26/23 1309 09/26/23 1658 09/26/23 2030 09/26/23 2100  BP: (!) 132/120 (!) 99/53 113/80   Pulse: 92 91 94   Resp: 19 18 18    Temp: 98.3 F (36.8 C) 98.5 F (36.9 C)    TempSrc:      SpO2: 95% 100%    Weight:      Height:      PainSc:    5    Body mass index is 29.12 kg/m.   Net IO Since Admission: -6,458.45 mL [09/26/23 2355]  Filed Weights   09/25/23 0705 09/26/23 0815 09/26/23 1224  Weight: 101.6 kg 102 kg 100.1 kg    Physical Exam  Constitutional: He appears chronically ill.  hemodynamically stable, no acute distress.   HENT:  Dry mucus membrane   Neck: No JVD present.  Right subclavian HD catheter  Cardiovascular: Normal rate, S1 normal and S2 normal. An irregularly irregular rhythm present. Exam reveals no gallop and no friction rub.  No murmur heard. Pulmonary/Chest: Breath sounds normal. He has no wheezes. He has no rales. He exhibits no tenderness.  Abdominal: Soft. Bowel sounds are normal. He exhibits no distension. There is no abdominal tenderness.  Urinary foley, gross hematuria.   Musculoskeletal:        General: Edema (trace bilaterally) present. No tenderness.     Comments: Chronic skin changes bilateral leg - suggestive of venous insufficiency/cellulitis     Neurological:  Alert to him,DOB, president. Not location.   Skin: Skin is warm and dry.    EKG: (personally reviewed by me) No new tracing.   Telemetry: (personally reviewed by me) Afib cVR   Impression / Recommendations:  Persistent Afib, now cVR  Cardiology consulted during his hospitalization for A-fib with RVR-likely physiologic in the setting of bilateral leg cellulitis, hematuria, anemia, etc.  Rate control: Metoprolol 25mg  po bid, was on a atenolol at home but was held secondary to hypotension requiring midodrine support requiring midodrine support Rhythm control: NA Thromboembolic prophylaxis: Eliquis (now held due to anemia, hematuria, needing PRBC's) Recommend rate control strategy for now as the risk of bleeding outweighs the benefit. Once cleared by other disciplines of medicine would recommend reinitiating  anticoagulation when it safe. TSH 5.9.   Chronic heart failure with preserved EF: Stage C, NYHA class II/III Echocardiogram March 2025: LVEF 60 to 65% Has developed acute kidney injury during this hospitalization and currently on hemodialysis-will defer volume management to dialysis for now. GDMT: Difficult to uptitrate due to hypotension, needing midodrine support, and AKI on hemodialysis. Monitor for now.     Hx of AS s/p TAVR - will need antibiotic prophylaxis.   AKI on HD - nephrology following    Hyponatremia: management per nephrology.    Hematuria / s/p PRBC's / Anemia: Hb as low as 6.7g/dL. management per primary and urology.    Palliative care is following - very appropriate given his clinical trajectory.    Further recommendations to follow as the case evolves.   This note was created using a voice recognition software as a result there may be grammatical errors inadvertently enclosed that do not reflect the nature of this encounter. Every attempt is made to correct such errors.   Tessa Lerner, DO, [facility]  [address] [place], [place] [address] Pager: [phone] Office: [phone] 09/26/2023 11:55 PM

## 2023-09-26 NOTE — Procedures (Signed)
 Patient seen and examined on Hemodialysis. The procedure was supervised and I have made appropriate changes. BP (!) 102/44   Pulse 98   Temp 98.3 F (36.8 C) (Oral)   Resp 14   Ht 6\' 1"  (1.854 m)   Wt 102 kg   SpO2 98%   BMI 29.67 kg/m   QB 400 mL/ in via dialysis catheter, UF goal 2L  Not able to pull goal- decreased.     Bufford Buttner MD [facility] [phone] 10:49 AM

## 2023-09-26 NOTE — Progress Notes (Signed)
 14 Days Post-Op Subjective: NAEON. Pleasant with no specific complaints. Aunt/part-time care taker joined [place].   Objective: Vital signs in last 24 hours: Temp:  [97.6 F (36.4 C)-98.3 F (36.8 C)] 98.3 F (36.8 C) (04/08 1309) Pulse Rate:  [79-100] 92 (04/08 1309) Resp:  [13-22] 19 (04/08 1309) BP: (92-132)/(44-120) 132/120 (04/08 1309) SpO2:  [94 %-100 %] 95 % (04/08 1309) Weight:  [100.1 kg-102 kg] 100.1 kg (04/08 1224)  Assessment/Plan: #hematuria Eliquis held  Pt cannot tolerate CT hematuria d/t ESRD His UA was not remarkable on 3/27, though there was rare bacteria. Has now completed Rocephin Trend labs. 1u PRBC given 4/5. HgB stable since.  CT cystogram reveals 3.8 x 1.5 x 2.7 cm focal disproportionate wall thickening along the superior wall of the bladder, trabeculation with scattered small diverticulum. Outpatient cystoscopy. Over 60y smoking hx. Improving lymphadenopathy. Palliative/goals of care- 09/25/21. DNR/DNI.  Will schedule with Dr. Lafonda Mosses of [facility] once back on service Thurs.    Intake/Output from previous day: 04/07 0701 - 04/08 0700 In: 240 [P.O.:240] Out: 500 [Urine:500]  Intake/Output this shift: Total I/O In: 237 [P.O.:237] Out: 1.9 [Other:1.9]  Physical Exam:  General: Older than stated age, confused CV: No cyanosis Lungs: equal chest rise Abdomen: Soft, NTND, no rebound or guarding Gu: condom catheter in place with light red urine  Lab Results: Recent Labs    09/24/23 0339 09/25/23 0258 09/26/23 0227  HGB 7.9* 8.0* 7.5*  HCT 27.1* 27.3* 25.4*   BMET Recent Labs    09/25/23 0258 09/26/23 0227  NA 130* 131*  K 3.7 3.7  CL 94* 95*  CO2 28 26  GLUCOSE 87 94  BUN 43* 53*  CREATININE 5.31* 6.17*  CALCIUM 8.1* 8.4*     Studies/Results: CT CYSTOGRAM ABD/PELVIS Result Date: 09/25/2023 CLINICAL DATA:  Hematuria.  History of chronic kidney disease. EXAM: CT CYSTOGRAM (CT ABDOMEN AND PELVIS WITH CONTRAST) TECHNIQUE:  Multi-detector CT imaging through the abdomen and pelvis was performed after 350 mL dilute Omnipaque 300 contrast had been introduced into the bladder for the purposes of performing CT cystography. RADIATION DOSE REDUCTION: This exam was performed according to the departmental dose-optimization program which includes automated exposure control, adjustment of the mA and/or kV according to patient size and/or use of iterative reconstruction technique. CONTRAST:  30mL OMNIPAQUE IOHEXOL 300 MG/ML  SOLN COMPARISON:  CT with IV and oral contrast 08/06/2020, PET-CT 08/14/2020. More recently a chest CT was obtained 09/19/2023. FINDINGS: Lower chest: Moderate to large bilateral pleural effusions continue to be seen, with compressive collapse of the right greater than left lower lobes versus underlying consolidation. The pleural fluid volume has not notably changed, but there is mild interstitial edema in the anterior lung bases with improvement. TAVR again noted aortic valve plane, heavy three-vessel coronary calcification. Stable cardiomegaly. The cardiac blood pool is low in density consistent with anemia. No substantial pericardial fluid. Hepatobiliary: There is loss of fine detail due to respiratory motion, artifact from the patient's arms in the field and lack of IV contrast. No liver mass is seen grossly. The gallbladder is contracted and not well demonstrated but there is no bile duct dilatation. Pancreas: Unremarkable without contrast. Spleen: Unremarkable without contrast. No abnormality is seen through the breathing motion. Adrenals/Urinary Tract: No adrenal mass. Again noted is a 1.6 cm Bosniak 1 cyst in the upper pole of the left kidney, Hounsfield density is 20.4, and another more superiorly in the left upper pole measuring 3 cm, Hounsfield density is 19.  No follow-up imaging is recommended. No other contour deforming abnormality of the unenhanced kidneys is seen. There is no urinary stone or obstruction. There  is trabeculation and scattered small diverticula of the mid to superior bladder wall, largest diverticulum is 1.5 cm to the right. There is a focal disproportionate wall thickening along the superior wall of the bladder best seen on the sagittal reformatting, on 7:89 measuring 3.8 by 1.5 cm AP and craniocaudal. On axial images is less well defined but measures about 2.7 cm AP. Mucosal mass is not excluded. Direct visualization is recommended. On the 2022 studies the bladder wall was thickened but in more generalized fashion and without asymmetries or trabeculation. Pre and postvoid imaging through the bladder revealed no evidence of bladder wall perforation or contrast extravasation. Stomach/Bowel: Moderate fold thickening in the proximal stomach is chronically seen. There is no small bowel obstruction or inflammation. The appendix is normal. There is moderate retained stool including focally in the rectum. There is dorsal perirectal stranding which could be due to dependent congestive change or stercoral proctitis. Vascular/Lymphatic: There is moderate to heavy aortoiliac calcific plaque. No AAA. There are borderline prominent bilateral inguinal chain nodes up to 9 mm in short axis, previously about twice this size. Slightly prominent external iliac chain nodes measuring 1 cm in short axis on the left and 1.2 cm on the right, also were previously larger. There are Portocaval lymph nodes up to 1.3 cm in short axis, also previously larger. Reproductive: No prostatomegaly. Other: Mild body wall anasarca appears similar to the prior study. There is mild rectus diastasis and outward protrusion containing nonobstructed small bowel loops. There is no incarcerated hernia. Minimal pelvic ascites. No other free fluid. No free air. Musculoskeletal: There is dextroscoliosis and advanced degenerative changes of the lumbar spine and advanced degenerative change in the lower thoracic spine. 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 Bridging osteophytes both anterior SI joints. IMPRESSION: 1. 3.8 x 1.5 x 2.7 cm focal disproportionate wall thickening along the superior wall of the bladder. Mucosal mass is not excluded. Direct visualization is recommended. 2. Trabeculation and scattered small diverticula of the mid to superior bladder wall. No bladder wall perforation or contrast extravasation. 3. No urinary stone or obstruction. 4. Moderate to large bilateral pleural effusions with compressive collapse of the right greater than left lower lobes versus underlying consolidation. The pleural fluid volume has not notably changed from April 1, but there is mild interstitial edema in the anterior lung bases with improvement. 5. Cardiomegaly with TAVR.  Likely anemia. 6. Aortic and coronary artery atherosclerosis. 7. Chronic fold thickening in the proximal stomach. 8. Moderate retained stool including focally in the rectum with dorsal perirectal stranding which could be due to dependent congestive change or stercoral proctitis. 9. Mild body wall anasarca and minimal pelvic ascites. 10. 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 11. Borderline prominent inguinal, external iliac, and Portocaval lymph nodes, all of which were previously larger. Electronically Signed   By: Almira Bar M.D.   On: 09/25/2023 05:04      LOS: 25 days   Jordan Kirschner, NP [facility] Pager: [phone]  09/26/2023, 1:24 PM

## 2023-09-26 NOTE — Progress Notes (Signed)
 [facility] Progress Note  Subjective:  Seen on HD Hypotension with HD More confused  Vitals:   09/26/23 0930 09/26/23 0959 09/26/23 1000 09/26/23 1030  BP: (!) 92/55 94/62 94/62  (!) 102/44  Pulse: 86  91 98  Resp: 13  14 14   Temp:      TempSrc:      SpO2: 100%  100% 98%  Weight:      Height:        Exam: Gen: NAD, [place] O2 CVS: tachy Resp: CTA Abd: +BS, soft, NT/ND Ext: still has 1+ bilat hip edema > pretib edema, also UE edema  Assessment/ Plan: AKI: b/l creat is 1.3, from PCP's office in Jan 2024. Assuming this is AKI but could also have CKD. UA shows some rbc's/ wbc's, no proteinuria. Renal US w/o obstruction. BP's were soft in 90s on presentation and then improved after IV abx and IVF's, but pt developed pulm edema on CXR #2 w/ bilat LE edema. Suspect AKI due to CHF/ cardiorenal. Started IV lasix 3/19 and titrated up the dose. Diuresed well x several days then BP dropped 3/23 so Lasix was decreased to 80 mg IV tid, then stopped.  UOP dropped but Scr stable. A renal CO2 angiogram was negative for renal artery stenosis. Then lasix was started po w/ increased UOP but unfortunately the Scr rose up to 3 then 4 then 6.  Repeat renal US showed no hydro, exam and CXR showed persistent fluid overload. IR placed temp cath 4/02. Pt was started on iHD 4/2, and got his 2nd HD 4/3 and 3rd session Saturday.  - next HD 09/26/23  - he is wheelchair/ bedbound at baseline- he is a very poor long-term dialysis candidate  - appreciate GOC--> discussed with Palliative and with pt's cousin, [name].  We discussed transitioning GOC  To no dialysis/ comfort.  [name] will be here later today to discuss with pt and the team Volume - diffuse LE edema and +CHF on all CXR's. Volume overload improving on exam, wt's are coming down. Cont to lower vol w/ HD. CXR today showing improvement (poor film).  BP - bp's soft, metoprolol was stopped. Tolerating UF w/ HD.  Atrial fib - metoprolol dc'd due to soft  BP's. HR's 100-120s, cardiology consulting.  Gross hematuria: appreciate urology assistance.  Anemia - hgb down to 6.7. PRBC's ordered for today.  LLE cellulitis: SP IV abx then keflex, now off abx COPD/ chronic resp failure - on home O2 2 L S/p TAVR Disposition lives [facility] MD 09/21/2023, 10:04 AM  Recent Labs  Lab 09/20/23 0355 09/20/23 1212 09/21/23 0315  HGB  --  8.0* 7.5*  ALBUMIN 2.1*  --  2.0*  CALCIUM 8.1*  --  7.9*  7.8*  PHOS 7.6*  --  4.4  CREATININE 6.23*  --  4.40*  4.39*  K 4.2  --  3.6  3.7    Inpatient medications:  atorvastatin  80 mg Oral QODAY   Chlorhexidine Gluconate Cloth  6 each Topical Q0600   Chlorhexidine Gluconate Cloth  6 each Topical Q0600   LORazepam  1 mg Oral BID   mometasone-formoterol  2 puff Inhalation BID   multivitamin with minerals  1 tablet Oral Q breakfast   pantoprazole  40 mg Oral Daily   sevelamer carbonate  800 mg Oral TID WC   sodium chloride flush  3 mL Intravenous Q12H   venlafaxine XR  75 mg Oral Q breakfast  albumin human     cefTRIAXone (ROCEPHIN)  IV 2 g (09/20/23 1311)   acetaminophen **OR** acetaminophen, acetaminophen, albumin human, albuterol, alum & mag hydroxide-simeth, camphor-menthol, hydrALAZINE, melatonin, ondansetron (ZOFRAN) IV, mouth rinse, oxyCODONE, polyethylene glycol, sodium chloride flush, traMADol

## 2023-09-26 NOTE — Progress Notes (Cosign Needed)
 Patient Name: Jordan [name] Date of Encounter: 09/26/2023 [facility] Cardiologist: Donato Schultz, MD   Interval Summary  .    73 yr old male with PMH of non-obstructive CAD, HTN, HLD,  HFpEF, persistent A fib, aortic stenosis s/p TAVR 06/30/2020, chronic oxygen dependent COPD, tobacco use, stroke, type 2 DM, OSA, chronic venous insufficiency, recurrent cellulitis, chronic anemia, anxiety with depression, who has been hospitalized here for 25 days for left leg cellulitis completed antibiotic, course complicated by AKI that ultimately started on hemodialysis by nephrology, acute on chronic anemia requiring PRBC transfusion. Cardiology is consulted on 09/24/23 for A fib RVR, complicated by hypotension, he was resumed on PTA metoprolol for rate control and remained on midodrine for BP support. Palliative care had been consulted, patient is DNR/DNI.   Patient is at dialysis, he is a little sleepy, awakened to loud voice, states he feels OK, denied chest pain or SOB.   Vital Signs .    Vitals:   09/25/23 2021 09/25/23 2034 09/26/23 0451 09/26/23 0700  BP:  98/65 (!) 105/48 92/61  Pulse:  95 79 83  Resp:  18 18   Temp:  98.1 F (36.7 C) 98.2 F (36.8 C) 97.6 F (36.4 C)  TempSrc:    Axillary  SpO2: 94% 96% 98% 100%  Weight:      Height:        Intake/Output Summary (Last 24 hours) at 09/26/2023 1610 Last data filed at 09/26/2023 0630 Gross per 24 hour  Intake 240 ml  Output 500 ml  Net -260 ml      09/25/2023    7:05 AM 09/23/2023    3:58 PM 09/23/2023   12:37 PM  Last 3 Weights  Weight (lbs) 223 lb 15.8 oz 216 lb 4.3 oz 222 lb 10.6 oz  Weight (kg) 101.6 kg 98.1 kg 101 kg      Telemetry/ECG    A fib with ventricular rate of 90s, PVCs  - Personally Reviewed  Physical Exam .   GEN: No acute distress.   Neck: No JVD Cardiac: Irregularly irregular, no murmurs, rubs, or gallops.  Respiratory: Clear to auscultation bilaterally. On 3L [place] oxygen  GI: Soft, nontender,  non-distended  MS: No leg edema   Assessment & Plan .     Persistent A fib with RVR - on PTA atenolol and Eliquis historically, atenolol had been held due to hypotension requiring midodrine and Eliquis had been held due to worsening anemia requiring transfusion and hematuira this admission, A fib RVR 110s 09/24/23 in the setting of worsening anemia/decondition/prolonged hospitalization, TSH elevated 5.9, Echo from 3/21 showed LVEF 60-65%, would continue rate control strategy and treat underlying medical issues  - metoprolol 25mg  BID added since 09/24/23, rate control is adequate, would not be too aggressive for rate control with ongoing medical issues  - transfuse to keep Hgb >7 at least  - keep Mag >2 and K >4  - maintain adequate hydration, treat infection if presents - resume Eliquis when no contraindication per primary team    Chronic HFpEF - He was felt volume overloaded initially and given high dose IV Lasix, later developed AKI and ultimately become hemodialysis dependent now  - Echo from 09/08/23 with LVEF 60-65%, normal TAVR valve function  - Most recent CXR from 4/6 showed moderate bilateral pleural effusions, right greater than left, with slight improvement from prior exam. Similar interstitial thickening likely pulmonary edema.  - clinically he is chronically deconditioned and frail, he does not  appears overt volume overloaded, warm and well perfused not consistent with low outpatient HF - continue dialysis for volume management, consider IV iron therapy if indicated  - GDMT: limited by hypotension and renal failure    Hx of aortic stenosis s/p TAVR 2022  - Echo from 09/08/23 showed LVEF 60-65% and normal prosthetic aortic valve function per review of Dr Rennis Golden    Nonobstructive coronary artery disease  - no chest pain  - continued on PTA lipitor 80mg  daily, not on ASA PTA due to DOAC use   AKI Leg cellulitis Hematuria Anemia  COPD Chronic venous stasis  Debility Confusion -  per primary team     For questions or updates, please contact [facility] Please consult [email or web address] for contact info under        Signed, Cyndi Bender, NP

## 2023-09-26 NOTE — Progress Notes (Signed)
 Received patient in bed.Alert and oriented x 3. Consent verified.  Access used : Right TLC that worked well.Dressing change done today.  Duration of treatment: 3 hours.  Medicines 2000 units given:                   Albumin 25 g.  Net uf: 1.9 L ,not tolerating prescribed uf of 2.5- 3L  Hand off to the patient's nurse.

## 2023-09-26 NOTE — Progress Notes (Addendum)
 Palliative Medicine Inpatient Follow Up Note HPI: Jordan [name] is an 73 y.o. male with past medical history of hypertension, heart failure with preserved ejection fraction, atrial fibrillation, CAD, COPD with chronic hypoxic respiratory failure on 2 L of oxygen was brought into the hospital from skilled nursing facility with worsening left leg wound. Palliative care has been asked to support additional goals of care conversations in the setting of recent initiation of hemodialysis.   Today's Discussion 09/26/2023  *Please note that this is a verbal dictation therefore any spelling or grammatical errors are due to the "Dragon Medical One" system interpretation.  Chart reviewed inclusive of vital signs, progress notes, laboratory results, and diagnostic images.   I met with Dorien this morning. He shares that he had an "awful night" and is overall not feeling well. He is unable to tell me exactly what is bothering him but shares he feels generally weakened. We reviewed the plan for him to go to [facility] today. I did share if the treatments are too much for him at any point he can opt to stop.   I spoke to patients cousin, [name] this morning. I shared the above information with him. 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 He shares he has ongoing encephalopathy and is concerned that when he discharged from here that his time will be short. We discussed the option(s) of hospice at [facility], [facility], and inpatient. [name] is understanding to this consideration should Bryceson continue to decline.   [name] is hopeful to speak to the Nephrology team to gain better insights on long term kidney function and prognosis. I shared that I would reach out to Dr. Signe Colt.  ___________________________ Addendum:  I met with [name] and Elisa this late afternoon. [name] had all of his questions answered by the Nephrologist. He understands the poor overall  quality of life that Shayne is likely to have moving into the future.  [name] shares the goal at this time is for Photographer to come and provide all of his essential documents. He notes the plan for this to be completed on Friday or Monday. After this happens the plan will be for additional goals of care conversations.  Discussed the importance of advanced directives which will be completed during hospitalization.   Krikor himself would like to take things one day at a time.   Questions and concerns addressed/Palliative Support Provided.   Additional Time: 30 minutes  Objective Assessment: Vital Signs Vitals:   09/26/23 0819 09/26/23 0853  BP: 102/65 107/60  Pulse:  88  Resp: 16 14  Temp:    SpO2:  100%    Intake/Output Summary (Last 24 hours) at 09/26/2023 0915 Last data filed at 09/26/2023 0630 Gross per 24 hour  Intake 240 ml  Output 500 ml  Net -260 ml   Last Weight  Most recent update: 09/25/2023  8:42 AM    Weight  101.6 kg (223 lb 15.8 oz)            Gen: Elderly Caucasian male chronically ill in appearance HEENT: moist mucous membranes CV: Irregular rate and rhythm PULM: On 2 L nasal cannula breathing is even and unlabored ABD: soft/nontender EXT: Generalized edema Neuro: Alert and oriented to self and place though at times disoriented  SUMMARY OF RECOMMENDATIONS   DNAR/DNI   MOST / DNR Form Completed, paper copy placed onto the chart electric copy can be found  in [place]   Advanced Directives are in the front of the chart for Chaplain to help in completing  Appreciate the Nephrology team updating patients cousin  Discussions related to long term concerns associated with dialysis were held with patients cousin --> Hospice was reviewed as an option should patient determine at any point he would like to stop HD treatments    Ongoing palliative care support  Billing based on MDM:  High ______________________________________________________________________________________ [facility]arr [facility] Palliative Medicine Team Team Cell Phone: [phone] Please utilize secure chat with additional questions, if there is no response within 30 minutes please call the above phone number  Palliative Medicine Team providers are available by phone from 7am to 7pm daily and can be reached through the team cell phone.  Should this patient require assistance outside of these hours, please call the patient's attending physician.

## 2023-09-26 NOTE — Plan of Care (Signed)
  Problem: Health Behavior/Discharge Planning: Goal: Ability to manage health-related needs will improve Outcome: Progressing   Problem: Clinical Measurements: Goal: Ability to maintain clinical measurements within normal limits will improve Outcome: Progressing Goal: Will remain free from infection Outcome: Progressing Goal: Diagnostic test results will improve Outcome: Progressing Goal: Respiratory complications will improve Outcome: Progressing Goal: Cardiovascular complication will be avoided Outcome: Progressing   Problem: Activity: Goal: Risk for activity intolerance will decrease Outcome: Progressing   Problem: Nutrition: Goal: Adequate nutrition will be maintained Outcome: Progressing   Problem: Coping: Goal: Level of anxiety will decrease Outcome: Progressing   Problem: Elimination: Goal: Will not experience complications related to bowel motility Outcome: Progressing Goal: Will not experience complications related to urinary retention Outcome: Progressing   Problem: Pain Managment: Goal: General experience of comfort will improve and/or be controlled Outcome: Progressing   Problem: Safety: Goal: Ability to remain free from injury will improve Outcome: Progressing   Problem: Skin Integrity: Goal: Risk for impaired skin integrity will decrease Outcome: Progressing   Problem: Clinical Measurements: Goal: Ability to avoid or minimize complications of infection will improve Outcome: Progressing   Problem: Skin Integrity: Goal: Skin integrity will improve Outcome: Progressing   Problem: Education: Goal: Understanding of CV disease, CV risk reduction, and recovery process will improve Outcome: Progressing Goal: Individualized Educational Video(s) Outcome: Progressing   Problem: Activity: Goal: Ability to return to baseline activity level will improve Outcome: Progressing   Problem: Cardiovascular: Goal: Ability to achieve and maintain adequate  cardiovascular perfusion will improve Outcome: Progressing Goal: Vascular access site(s) Level 0-1 will be maintained Outcome: Progressing   Problem: Health Behavior/Discharge Planning: Goal: Ability to safely manage health-related needs after discharge will improve Outcome: Progressing

## 2023-09-26 NOTE — Progress Notes (Signed)
 OT Cancellation Note  Patient Details Name: MACOY RODWELL MRN: [id number] DOB: 28-May-1951   Cancelled Treatment:    Reason Eval/Treat Not Completed: (P) Patient declined, no reason specified, stopped by twice today, Pt with family, doesn't want to be bothered. Checked back later and mobility/nursing just attempted to get Pt to chair and now Pt is tired, asked to return tomorrow.   Alexis Goodell 09/26/2023, 2:56 PM

## 2023-09-26 NOTE — Progress Notes (Signed)
 This chaplain responded to PMT NP-Michelle consult for creating the Pt. Advance Directive: HCPOA only. This Pt. did not complete a Living Will.   The chaplain is present with the Pt., the Pt. cousin-[name] and two friends/family. The Pt. is able to answer clarifying questions with the chaplain after PMT NP-Michelle's AD education.  The chaplain joined the Pt., [name], family, notary, and witnesses for the notarizing of the Pt. AD: HCPOA only.   The Pt. named Jordan [name] as his healthcare agent along with [employer]" Silvestre Mesi. The chaplain understands, per the Pt. request all medical decisions will be made jointly by [name] and [place].  This chaplain gave the Pt. the original document along with two copies. The chaplain scanned the Pt. AD into the Pt. EMR.  This chaplain is available for F/U spiritual care as needed.  Chaplain Stephanie Acre [phone]

## 2023-09-26 NOTE — Progress Notes (Signed)
 PROGRESS NOTE  Jordan [name] ZOX:[id number] DOB: 1950/07/14 DOA: 09/01/2023 PCP: Kennith Center, NP   LOS: 25 days   Brief narrative:  Jordan [name] is an 73 y.o. male with past medical history of hypertension, heart failure with preserved ejection fraction, atrial fibrillation, CAD, COPD with chronic hypoxic respiratory failure on 2 L of oxygen was brought into the hospital from skilled nursing facility with worsening left leg wound.  Patient with history of chronic venous stasis.  Patient stated that he was doing well until he got a new wheelchair last week and the nurses aide tried to put him in bed but lost her balance and he scraped his leg and his skin came off.  In the ED, patient was noted to have increased erythema over the denuded part of the skin.  WBC was elevated at 14, creatinine at 2.4 and blood pressure was 104/60.  Heart rate was 115.  Patient was started on vancomycin and cefepime and was admitted hospital for further evaluation and treatment.    During hospitalization, patient had atrial fibrillation with RVR and cardiology followed the patient.  He has been seen by nephrology and has been initiated on hemodialysis pending renal improvement.  Urology also followed the patient due to hematuria.  Patient will need  skilled nursing facility placement when determination on dialysis has been made.  Assessment and plan.  Principal Problem:   Left leg cellulitis Active Problems:   Acute renal failure (ARF) (HCC)   Gross hematuria   Hypotension   Pleural effusion on right   Essential hypertension   HLD (hyperlipidemia)   Chronic venous stasis dermatitis of both lower extremities   COPD (chronic obstructive pulmonary disease) (HCC)   S/P TAVR (transcatheter aortic valve replacement)   Chronic heart failure with preserved ejection fraction (HCC)   Persistent atrial fibrillation (HCC)   Chronic respiratory failure with hypoxia (HCC)   Hyponatremia  * Atrial  fibrillation with RVR, history of persistent atrial fibrillation  Improved rate at this time.  Cardiology saw the patient during hospitalization.  Continue metoprolol and add midodrine if needed.   Eliquis on hold due to hematuria.   Acute renal failure Creatinine today at 6.1.  Seen by nephrology and patient has been started on hemodialysis during hospitalization.   Nephrology following the patient.  Hyponatremia. Being followed by nephrology likely secondary to renal failure.  Latest sodium of 131.  Left leg cellulitis Status post 10-day Keflex and 5-day course of doxycycline.  Patient also was seen by vascular surgery and underwent arteriogram on 325/25 with no significant peripheral vascular disease. Improving.  Patient will need to follow-up with  Dr Lajoyce Corners as outpatient.  Hematuria Improved.    Hemoglobin down has improved to 8.0 after blood transfusion for hemoglobin of 6.9.   Seen by urology and Eliquis on hold.  CT cystogram of the abdomen and pelvis due to hematuria was done which showed 3.8 x 1.5 x 2.7 cm focal disproportionate wall thickening along the superior wall of the bladder with trabeculation and small diverticuli.  Urology following.  Might need to restart Eliquis if okay with urology.  Acute blood loss anemia secondary to hematuria.  Received 1 unit of packed RBC.  Hemoglobin today at 7.5 from 8.0. Will transfuse for hemoglobin less than 7.  Pleural effusion on right On 2 L of oxygen.  Continue hemodialysis for fluid management.   Hypotension has improved at this time   Chronic respiratory failure with hypoxia  Chronically on 2  L/min.  Will continue.      Chronic heart failure with preserved ejection fraction Patient had large right pleural effusion on CXR.  Continue volume management as per hemodialysis.  On 2 L of oxygen by nasal cannula.  S/P TAVR (transcatheter aortic valve replacement) Stable.   COPD (chronic obstructive pulmonary disease) Continue nebulizers  as needed.    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 Continue with wound care.  Pressure injury left heel unstageable.  Present on admission.  Continue offloading and wound care if necessary. Pressure Injury 09/02/23 Heel Left Unstageable - Full thickness tissue loss in which the base of the injury is covered by slough (yellow, tan, gray, green or brown) and/or eschar (tan, brown or black) in the wound bed. (Active)  09/02/23 1713  Location: Heel  Location Orientation: Left  Staging: Unstageable - Full thickness tissue loss in which the base of the injury is covered by slough (yellow, tan, gray, green or brown) and/or eschar (tan, brown or black) in the wound bed.  Wound Description (Comments):   Present on Admission: Yes     Hyperlipidemia Chronic. On lipitor 80 mg every other day.   Essential hypertension Holding antihypertensives for now.  Debility, deconditioning.  Has been bed bound for 6 months.  Patient is from assisted living.  Will need skilled nursing facility likely on discharge.  DVT prophylaxis: Place and maintain sequential compression device Start: 09/19/23 1109   Disposition: Likely to skilled nursing facility when clinically improved and okay with nephrology.  Status is: Inpatient Remains inpatient appropriate because: pending clinical improvement, need for hemodialysis, new hemodialysis, need for skilled nursing facility placement.   Code Status:     Code Status: Limited: Do not attempt resuscitation (DNR) -DNR-LIMITED -Do Not Intubate/DNI   Family Communication:   I spoke with the patient's aunt on the phone on 09/24/2023   Consultants: Vascular surgery Nephrology urology  Cardiology Palliative care  Procedures:  Abdominal Aortogram on 09-12-2023 Foley catheter placement Right internal jugular triple-lumen catheter on 09/20/2023 by interventional radiology hemodialysis  Anti-infectives:  Rocephin  IV-completed  Subjective:  Today, patient was seen and examined at bedside.  Patient still complains of some weakness.  Seen during hemodialysis.  Denies overt dyspnea cough chest pain fever or chills.  Patient states that his nausea is better but still does not have good appetite.  Objective: Vitals:   09/26/23 1204 09/26/23 1309  BP: (!) 117/52 (!) 132/120  Pulse: 97 92  Resp: 19 19  Temp: 97.8 F (36.6 C) 98.3 F (36.8 C)  SpO2: 99% 95%    Intake/Output Summary (Last 24 hours) at 09/26/2023 1335 Last data filed at 09/26/2023 1204 Gross per 24 hour  Intake 477 ml  Output 501.9 ml  Net -24.9 ml   Filed Weights   09/25/23 0705 09/26/23 0815 09/26/23 1224  Weight: 101.6 kg 102 kg 100.1 kg   Body mass index is 29.12 kg/m.   Physical Exam:  GENERAL: Patient is alert awake and oriented. Not in obvious distress.  Obese build.  Appears weak and deconditioned, on nasal cannula oxygen  HENT: No scleral pallor or icterus. Pupils equally reactive to light. Oral mucosa is moist NECK: is supple, no gross swelling noted.  Right internal jugular hemodialysis catheter in place. CHEST:   Diminished breath sounds bilaterally. CVS: S1 and S2 heard, no murmur.  Rate controlled at this time ABDOMEN: Soft, non-tender, bowel sounds are present.  Foley catheter  EXTREMITIES bilateral lower extremity edema, mild  erythema CNS: Cranial nerves are intact.  Generalized weakness noted.  Moves all extremities. SKIN: warm and dry, left heel pressure ulceration present on admission  Data Review: I have personally reviewed the following laboratory data and studies,  CBC: Recent Labs  Lab 09/22/23 0340 09/23/23 0237 09/24/23 0339 09/25/23 0258 09/26/23 0227  WBC 7.6 7.8 8.8 8.3 7.8  HGB 7.1* 6.9* 7.9* 8.0* 7.5*  HCT 23.9* 23.4* 27.1* 27.3* 25.4*  MCV 81.8 81.8 82.9 83.5 83.3  PLT 266 207 279 262 278   Basic Metabolic Panel: Recent Labs  Lab 09/21/23 0315 09/22/23 0340 09/23/23 0237  09/24/23 0339 09/25/23 0258 09/26/23 0227  NA 133*  133* 131*  131* 131* 130*  130* 130* 131*  K 3.6  3.7 3.6  3.6 3.8 3.5  3.5 3.7 3.7  CL 97*  96* 95*  95* 94* 95*  94* 94* 95*  CO2 26  26 26  25 27 25  25 28 26   GLUCOSE 98  97 107*  106* 98 102*  101* 87 94  BUN 55*  54* 36*  34* 44* 30*  30* 43* 53*  CREATININE 4.40*  4.39* 3.53*  3.63* 5.31* 3.91*  3.91* 5.31* 6.17*  CALCIUM 7.9*  7.8* 7.8*  7.9* 8.2* 8.1*  8.1* 8.1* 8.4*  MG 1.8 1.8 1.9 1.9 2.1  --   PHOS 4.4 3.7 4.6 3.3 4.1 4.3   Liver Function Tests: Recent Labs  Lab 09/22/23 0340 09/23/23 0237 09/24/23 0339 09/25/23 0258 09/26/23 0227  ALBUMIN 2.0* 2.0* 2.1* 2.0* 2.0*   No results for input(s): "LIPASE", "AMYLASE" in the last 168 hours. No results for input(s): "AMMONIA" in the last 168 hours. Cardiac Enzymes: No results for input(s): "CKTOTAL", "CKMB", "CKMBINDEX", "TROPONINI" in the last 168 hours. BNP (last 3 results) No results for input(s): "BNP" in the last 8760 hours.  ProBNP (last 3 results) No results for input(s): "PROBNP" in the last 8760 hours.  CBG: Recent Labs  Lab 09/25/23 2033  GLUCAP 101*   No results found for this or any previous visit (from the past 240 hours).   Studies: CT CYSTOGRAM ABD/PELVIS Result Date: 09/25/2023 CLINICAL DATA:  Hematuria.  History of chronic kidney disease. EXAM: CT CYSTOGRAM (CT ABDOMEN AND PELVIS WITH CONTRAST) TECHNIQUE: Multi-detector CT imaging through the abdomen and pelvis was performed after 350 mL dilute Omnipaque 300 contrast had been introduced into the bladder for the purposes of performing CT cystography. RADIATION DOSE REDUCTION: This exam was performed according to the departmental dose-optimization program which includes automated exposure control, adjustment of the mA and/or kV according to patient size and/or use of iterative reconstruction technique. CONTRAST:  30mL OMNIPAQUE IOHEXOL 300 MG/ML  SOLN COMPARISON:  CT with IV and  oral contrast 08/06/2020, PET-CT 08/14/2020. More recently a chest CT was obtained 09/19/2023. FINDINGS: Lower chest: Moderate to large bilateral pleural effusions continue to be seen, with compressive collapse of the right greater than left lower lobes versus underlying consolidation. The pleural fluid volume has not notably changed, but there is mild interstitial edema in the anterior lung bases with improvement. TAVR again noted aortic valve plane, heavy three-vessel coronary calcification. Stable cardiomegaly. The cardiac blood pool is low in density consistent with anemia. No substantial pericardial fluid. Hepatobiliary: There is loss of fine detail due to respiratory motion, artifact from the patient's arms in the field and lack of IV contrast. No liver mass is seen grossly. The gallbladder is contracted and not well demonstrated but there is no  bile duct dilatation. Pancreas: Unremarkable without contrast. Spleen: Unremarkable without contrast. No abnormality is seen through the breathing motion. Adrenals/Urinary Tract: No adrenal mass. Again noted is a 1.6 cm Bosniak 1 cyst in the upper pole of the left kidney, Hounsfield density is 20.4, and another more superiorly in the left upper pole measuring 3 cm, Hounsfield density is 19. No follow-up imaging is recommended. No other contour deforming abnormality of the unenhanced kidneys is seen. There is no urinary stone or obstruction. There is trabeculation and scattered small diverticula of the mid to superior bladder wall, largest diverticulum is 1.5 cm to the right. There is a focal disproportionate wall thickening along the superior wall of the bladder best seen on the sagittal reformatting, on 7:89 measuring 3.8 by 1.5 cm AP and craniocaudal. On axial images is less well defined but measures about 2.7 cm AP. Mucosal mass is not excluded. Direct visualization is recommended. On the 2022 studies the bladder wall was thickened but in more generalized fashion  and without asymmetries or trabeculation. Pre and postvoid imaging through the bladder revealed no evidence of bladder wall perforation or contrast extravasation. Stomach/Bowel: Moderate fold thickening in the proximal stomach is chronically seen. There is no small bowel obstruction or inflammation. The appendix is normal. There is moderate retained stool including focally in the rectum. There is dorsal perirectal stranding which could be due to dependent congestive change or stercoral proctitis. Vascular/Lymphatic: There is moderate to heavy aortoiliac calcific plaque. No AAA. There are borderline prominent bilateral inguinal chain nodes up to 9 mm in short axis, previously about twice this size. Slightly prominent external iliac chain nodes measuring 1 cm in short axis on the left and 1.2 cm on the right, also were previously larger. There are Portocaval lymph nodes up to 1.3 cm in short axis, also previously larger. Reproductive: No prostatomegaly. Other: Mild body wall anasarca appears similar to the prior study. There is mild rectus diastasis and outward protrusion containing nonobstructed small bowel loops. There is no incarcerated hernia. Minimal pelvic ascites. No other free fluid. No free air. Musculoskeletal: There is dextroscoliosis and advanced degenerative changes of the lumbar spine and advanced degenerative change in the lower thoracic spine. 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 Bridging osteophytes both anterior SI joints. IMPRESSION: 1. 3.8 x 1.5 x 2.7 cm focal disproportionate wall thickening along the superior wall of the bladder. Mucosal mass is not excluded. Direct visualization is recommended. 2. Trabeculation and scattered small diverticula of the mid to superior bladder wall. No bladder wall perforation or contrast extravasation. 3. No urinary stone or obstruction. 4. Moderate to large bilateral pleural effusions with compressive collapse of the  right greater than left lower lobes versus underlying consolidation. The pleural fluid volume has not notably changed from April 1, but there is mild interstitial edema in the anterior lung bases with improvement. 5. Cardiomegaly with TAVR.  Likely anemia. 6. Aortic and coronary artery atherosclerosis. 7. Chronic fold thickening in the proximal stomach. 8. Moderate retained stool including focally in the rectum with dorsal perirectal stranding which could be due to dependent congestive change or stercoral proctitis. 9. Mild body wall anasarca and minimal pelvic ascites. 10. 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 11. Borderline prominent inguinal, external iliac, and Portocaval lymph nodes, all of which were previously larger. Electronically Signed   By: Almira Bar M.D.   On: 09/25/2023 05:04       Joycelyn Das, MD  [facility] 09/26/2023  If  7PM-7AM, please contact night-coverage

## 2023-09-27 DIAGNOSIS — E782 Mixed hyperlipidemia: Secondary | ICD-10-CM

## 2023-09-27 DIAGNOSIS — N179 Acute kidney failure, unspecified: Secondary | ICD-10-CM | POA: Diagnosis not present

## 2023-09-27 DIAGNOSIS — E871 Hypo-osmolality and hyponatremia: Secondary | ICD-10-CM

## 2023-09-27 DIAGNOSIS — I10 Essential (primary) hypertension: Secondary | ICD-10-CM

## 2023-09-27 DIAGNOSIS — I4819 Other persistent atrial fibrillation: Secondary | ICD-10-CM

## 2023-09-27 DIAGNOSIS — I5032 Chronic diastolic (congestive) heart failure: Secondary | ICD-10-CM | POA: Diagnosis not present

## 2023-09-27 DIAGNOSIS — Z952 Presence of prosthetic heart valve: Secondary | ICD-10-CM

## 2023-09-27 DIAGNOSIS — I872 Venous insufficiency (chronic) (peripheral): Secondary | ICD-10-CM | POA: Diagnosis not present

## 2023-09-27 LAB — RENAL FUNCTION PANEL
Albumin: 2.2 g/dL — ABNORMAL LOW (ref 3.5–5.0)
Anion gap: 9 (ref 5–15)
BUN: 34 mg/dL — ABNORMAL HIGH (ref 8–23)
CO2: 27 mmol/L (ref 22–32)
Calcium: 8 mg/dL — ABNORMAL LOW (ref 8.9–10.3)
Chloride: 96 mmol/L — ABNORMAL LOW (ref 98–111)
Creatinine, Ser: 4.37 mg/dL — ABNORMAL HIGH (ref 0.61–1.24)
GFR, Estimated: 14 mL/min — ABNORMAL LOW (ref >60)
Glucose, Bld: 104 mg/dL — ABNORMAL HIGH (ref 70–99)
Phosphorus: 3.3 mg/dL (ref 2.5–4.6)
Potassium: 3.4 mmol/L — ABNORMAL LOW (ref 3.5–5.1)
Sodium: 132 mmol/L — ABNORMAL LOW (ref 135–145)

## 2023-09-27 LAB — BASIC METABOLIC PANEL WITH GFR
Anion gap: 8 (ref 5–15)
BUN: 34 mg/dL — ABNORMAL HIGH (ref 8–23)
CO2: 27 mmol/L (ref 22–32)
Calcium: 8 mg/dL — ABNORMAL LOW (ref 8.9–10.3)
Chloride: 97 mmol/L — ABNORMAL LOW (ref 98–111)
Creatinine, Ser: 4.37 mg/dL — ABNORMAL HIGH (ref 0.61–1.24)
GFR, Estimated: 14 mL/min — ABNORMAL LOW (ref >60)
Glucose, Bld: 105 mg/dL — ABNORMAL HIGH (ref 70–99)
Potassium: 3.4 mmol/L — ABNORMAL LOW (ref 3.5–5.1)
Sodium: 132 mmol/L — ABNORMAL LOW (ref 135–145)

## 2023-09-27 LAB — CBC
HCT: 24.3 % — ABNORMAL LOW (ref 39.0–52.0)
Hemoglobin: 7.1 g/dL — ABNORMAL LOW (ref 13.0–17.0)
MCH: 24.5 pg — ABNORMAL LOW (ref 26.0–34.0)
MCHC: 29.2 g/dL — ABNORMAL LOW (ref 30.0–36.0)
MCV: 83.8 fL (ref 80.0–100.0)
Platelets: 253 10*3/uL (ref 150–400)
RBC: 2.9 MIL/uL — ABNORMAL LOW (ref 4.22–5.81)
RDW: 24.6 % — ABNORMAL HIGH (ref 11.5–15.5)
WBC: 7.9 10*3/uL (ref 4.0–10.5)
nRBC: 0 % (ref 0.0–0.2)

## 2023-09-27 LAB — MAGNESIUM: Magnesium: 2 mg/dL (ref 1.7–2.4)

## 2023-09-27 LAB — GLUCOSE, CAPILLARY: Glucose-Capillary: 91 mg/dL (ref 70–99)

## 2023-09-27 MED ORDER — POTASSIUM CHLORIDE CRYS ER 20 MEQ PO TBCR
40.0000 meq | EXTENDED_RELEASE_TABLET | Freq: Once | ORAL | Status: AC
  Administered 2023-09-27: 40 meq via ORAL
  Filled 2023-09-27: qty 2

## 2023-09-27 NOTE — Progress Notes (Addendum)
 PROGRESS NOTE  ORIS STAFFIERI WUJ:[id number] DOB: 02/16/1951 DOA: 09/01/2023 PCP: Kennith Center, NP   LOS: 26 days   Brief narrative:  Jordan [name] is an 73 y.o. male with past medical history of hypertension, heart failure with preserved ejection fraction, atrial fibrillation, CAD, COPD with chronic hypoxic respiratory failure on 2 L of oxygen was brought into the hospital from skilled nursing facility with worsening left leg wound.  Patient with history of chronic venous stasis.  Patient stated that he was doing well until he got a new wheelchair last week and the nurses aide tried to put him in bed but lost her balance and he scraped his leg and his skin came off.  In the ED, patient was noted to have increased erythema over the denuded part of the skin.  WBC was elevated at 14, creatinine at 2.4 and blood pressure was 104/60.  Heart rate was 115.  Patient was started on vancomycin and cefepime and was admitted hospital for further evaluation and treatment.    During hospitalization, patient had atrial fibrillation with RVR and cardiology followed the patient.  He has been seen by nephrology and has been initiated on hemodialysis pending renal improvement.  Urology also followed the patient due to hematuria.  Patient will need  skilled nursing facility placement  when determination on dialysis has been made.  Assessment and plan.  Principal Problem:   Left leg cellulitis Active Problems:   Acute renal failure (ARF) (HCC)   Gross hematuria   Hypotension   Pleural effusion on right   Essential hypertension   HLD (hyperlipidemia)   Chronic venous stasis dermatitis of both lower extremities   COPD (chronic obstructive pulmonary disease) (HCC)   S/P TAVR (transcatheter aortic valve replacement)   Chronic heart failure with preserved ejection fraction (HCC)   Persistent atrial fibrillation (HCC)   Chronic respiratory failure with hypoxia (HCC)   Hyponatremia  * Atrial  fibrillation with RVR, history of persistent atrial fibrillation  Patient was was on atenolol prior to discharge.  Cardiology following.  Continue metoprolol and add midodrine if needed.   Eliquis on hold due to hematuria.  Rate controlled at this time.   Acute renal failure Creatinine today at 6.1.  Seen by nephrology and patient has been started on hemodialysis during hospitalization.   Nephrology following the patient.  Further plans as per nephrology.  Hyponatremia. Being followed by nephrology likely secondary to renal failure.  Latest sodium of 132.  Mild hypokalemia.  Will give 40 meq po x 1 of KCL today.  Left leg cellulitis Status post 10-day Keflex and 5-day course of doxycycline.  Patient also was seen by vascular surgery and underwent arteriogram on 325/25 with no significant peripheral vascular disease. Improving.  Patient will need to follow-up with  Dr Lajoyce Corners as outpatient.  Hematuria Improved.    Hemoglobin down has improved to 8.0 after blood transfusion for hemoglobin of 6.9.  Hemoglobin down to 7.1 today.  Seen by urology and Eliquis on hold.  CT cystogram of the abdomen and pelvis due to hematuria was done which showed 3.8 x 1.5 x 2.7 cm focal disproportionate wall thickening along the superior wall of the bladder with trabeculation and small diverticuli.  Urology following.  Still with clear slightly blood-tinged urine.  Will need to follow-up with [facility].  Acute blood loss anemia secondary to hematuria.  Received 1 unit of packed RBC.  Hemoglobin today at 7.1, from 7.5,Will transfuse for hemoglobin less than 7.  Pleural effusion on right On 3 L of oxygen.  Continue hemodialysis for fluid management.   Hypotension Today this morning.   Chronic respiratory failure with hypoxia  Chronically on 2 L/min.  Will continue.  Currently on 3 L oxygen.    Chronic heart failure with preserved ejection fraction Patient had large right pleural effusion on CXR.  Continue  volume management as per hemodialysis.  On 3 L of oxygen by nasal cannula.  S/P TAVR (transcatheter aortic valve replacement) Stable.   COPD (chronic obstructive pulmonary disease) Continue nebulizers as needed.    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 Continue with wound care.  Pressure injury left heel unstageable.  Present on admission.  Continue offloading and wound care if necessary. Pressure Injury 09/02/23 Heel Left Unstageable - Full thickness tissue loss in which the base of the injury is covered by slough (yellow, tan, gray, green or brown) and/or eschar (tan, brown or black) in the wound bed. (Active)  09/02/23 1713  Location: Heel  Location Orientation: Left  Staging: Unstageable - Full thickness tissue loss in which the base of the injury is covered by slough (yellow, tan, gray, green or brown) and/or eschar (tan, brown or black) in the wound bed.  Wound Description (Comments):   Present on Admission: Yes     Hyperlipidemia Chronic. On lipitor 80 mg every other day.   Essential hypertension Holding antihypertensives for now, blood pressure marginally low.  Debility, deconditioning.  Has been bed bound for 6 months.  Patient is from assisted living.  Will need skilled nursing facility likely on discharge.  DVT prophylaxis: Place and maintain sequential compression device Start: 09/19/23 1109   Disposition: Likely to skilled nursing facility when clinically improved and okay with nephrology.  Status is: Inpatient  Remains inpatient appropriate because: pending clinical improvement,  new hemodialysis, need for skilled nursing facility placement.,  Determination of dialysis needs   Code Status:     Code Status: Limited: Do not attempt resuscitation (DNR) -DNR-LIMITED -Do Not Intubate/DNI   Family Communication:   I spoke with the patient's aunt on the phone on 09/24/2023   Consultants: Vascular surgery Nephrology urology   Cardiology Palliative care  Procedures:  Abdominal Aortogram on 09-12-2023 Foley catheter placement Right internal jugular triple-lumen catheter on 09/20/2023 by interventional radiology hemodialysis  Anti-infectives:  Rocephin IV-completed  Subjective:  Today, patient was seen and examined at bedside.  Patient states that he feels okay.  Denies any nausea vomiting and states that he has been eating better.  Denies overt dyspnea  Objective: Vitals:   09/27/23 0752 09/27/23 0826  BP: (!) 91/48   Pulse: 88   Resp: 18   Temp: 98.4 F (36.9 C)   SpO2: 94% 91%    Intake/Output Summary (Last 24 hours) at 09/27/2023 1150 Last data filed at 09/27/2023 0918 Gross per 24 hour  Intake 120 ml  Output 951.9 ml  Net -831.9 ml   Filed Weights   09/25/23 0705 09/26/23 0815 09/26/23 1224  Weight: 101.6 kg 102 kg 100.1 kg   Body mass index is 29.12 kg/m.   Physical Exam:  GENERAL: Patient is alert awake and oriented. Not in obvious distress.  Obese built.  Appears weak and deconditioned, on nasal cannula oxygen  HENT: No scleral pallor or icterus. Pupils equally reactive to light. Oral mucosa is moist NECK: is supple, no gross swelling noted.  Right internal jugular hemodialysis catheter in place. CHEST:   Diminished breath sounds bilaterally. CVS: S1  and S2 heard, no murmur.  Rate controlled at this time ABDOMEN: Soft, non-tender, bowel sounds are present.  Foley catheter with slightly blood tinged urine. EXTREMITIES bilateral lower extremity edema, mild erythema CNS: Cranial nerves are intact.  Generalized weakness noted.  Moves all extremities. SKIN: warm and dry, left heel pressure ulceration present on admission  Data Review: I have personally reviewed the following laboratory data and studies,  CBC: Recent Labs  Lab 09/23/23 0237 09/24/23 0339 09/25/23 0258 09/26/23 0227 09/27/23 0304  WBC 7.8 8.8 8.3 7.8 7.9  HGB 6.9* 7.9* 8.0* 7.5* 7.1*  HCT 23.4* 27.1* 27.3* 25.4*  24.3*  MCV 81.8 82.9 83.5 83.3 83.8  PLT 207 279 262 278 253   Basic Metabolic Panel: Recent Labs  Lab 09/22/23 0340 09/23/23 0237 09/24/23 0339 09/25/23 0258 09/26/23 0227 09/27/23 0304  NA 131*  131* 131* 130*  130* 130* 131* 132*  132*  K 3.6  3.6 3.8 3.5  3.5 3.7 3.7 3.4*  3.4*  CL 95*  95* 94* 95*  94* 94* 95* 97*  96*  CO2 26  25 27 25  25 28 26 27  27   GLUCOSE 107*  106* 98 102*  101* 87 94 105*  104*  BUN 36*  34* 44* 30*  30* 43* 53* 34*  34*  CREATININE 3.53*  3.63* 5.31* 3.91*  3.91* 5.31* 6.17* 4.37*  4.37*  CALCIUM 7.8*  7.9* 8.2* 8.1*  8.1* 8.1* 8.4* 8.0*  8.0*  MG 1.8 1.9 1.9 2.1  --  2.0  PHOS 3.7 4.6 3.3 4.1 4.3 3.3   Liver Function Tests: Recent Labs  Lab 09/23/23 0237 09/24/23 0339 09/25/23 0258 09/26/23 0227 09/27/23 0304  ALBUMIN 2.0* 2.1* 2.0* 2.0* 2.2*   No results for input(s): "LIPASE", "AMYLASE" in the last 168 hours. No results for input(s): "AMMONIA" in the last 168 hours. Cardiac Enzymes: No results for input(s): "CKTOTAL", "CKMB", "CKMBINDEX", "TROPONINI" in the last 168 hours. BNP (last 3 results) No results for input(s): "BNP" in the last 8760 hours.  ProBNP (last 3 results) No results for input(s): "PROBNP" in the last 8760 hours.  CBG: Recent Labs  Lab 09/25/23 2033 09/27/23 0753  GLUCAP 101* 91   No results found for this or any previous visit (from the past 240 hours).   Studies: No results found.      Joycelyn Das, MD  [facility] 09/27/2023  If 7PM-7AM, please contact night-coverage

## 2023-09-27 NOTE — Progress Notes (Signed)
   15 Days Post-Op Subjective: NAEON. Pt eating breakfast on rounds. Clear lightly blood tinged urine.   Objective: Vital signs in last 24 hours: Temp:  [97.8 F (36.6 C)-98.7 F (37.1 C)] 98.4 F (36.9 C) (04/09 0752) Pulse Rate:  [86-100] 88 (04/09 0752) Resp:  [13-22] 18 (04/09 0752) BP: (91-132)/(44-120) 91/48 (04/09 0752) SpO2:  [91 %-100 %] 91 % (04/09 0826) Weight:  [100.1 kg] 100.1 kg (04/08 1224)  Assessment/Plan: #hematuria Eliquis held  Pt cannot tolerate CT hematuria d/t ESRD His UA was not remarkable on 3/27, though there was rare bacteria. Has now completed Rocephin Trend labs. 1u PRBC given 4/5.  HgB slowly down trending.Hematuria continues to improve and is nearly resolved.  CT cystogram reveals 3.8 x 1.5 x 2.7 cm focal disproportionate wall thickening along the superior wall of the bladder, trabeculation with scattered small diverticulum. Outpatient cystoscopy. Over 60y smoking hx. Improving lymphadenopathy. Palliative/goals of care- 09/25/21. DNR/DNI.  Will schedule with Dr. Lafonda Mosses of [facility] once back on service Thurs.  Intake/Output from previous day: 04/08 0701 - 04/09 0700 In: 457 [P.O.:457] Out: 751.9 [Urine:750]  Intake/Output this shift: No intake/output data recorded.  Physical Exam:  General: Alert and oriented CV: No cyanosis Lungs: equal chest rise Abdomen: Soft, NTND, no rebound or guarding Gu: foley in place draining lightly blood tinged urine  Lab Results: Recent Labs    09/25/23 0258 09/26/23 0227 09/27/23 0304  HGB 8.0* 7.5* 7.1*  HCT 27.3* 25.4* 24.3*   BMET Recent Labs    09/26/23 0227 09/27/23 0304  NA 131* 132*  132*  K 3.7 3.4*  3.4*  CL 95* 97*  96*  CO2 26 27  27   GLUCOSE 94 105*  104*  BUN 53* 34*  34*  CREATININE 6.17* 4.37*  4.37*  CALCIUM 8.4* 8.0*  8.0*     Studies/Results: No results found.    LOS: 26 days   Jordan Kirschner, NP [facility] Pager: [phone]  09/27/2023, 8:50 AM

## 2023-09-27 NOTE — Progress Notes (Signed)
   Palliative Medicine Inpatient Follow Up Note HPI: Jordan [name] is an 73 y.o. male with past medical history of hypertension, heart failure with preserved ejection fraction, atrial fibrillation, CAD, COPD with chronic hypoxic respiratory failure on 2 L of oxygen was brought into the hospital from skilled nursing facility with worsening left leg wound. Palliative care has been asked to support additional goals of care conversations in the setting of recent initiation of hemodialysis.   Today's Discussion 09/27/2023  *Please note that this is a verbal dictation therefore any spelling or grammatical errors are due to the "Dragon Medical One" system interpretation.  Chart reviewed inclusive of vital signs, progress notes, laboratory results, and diagnostic images.   I met with Jordan [name] this afternoon in the presence of his aunt and two former caregivers.   Jordan [name] shares that he is feeling tired today but overall improved. He is finishing his lunch during my visit. He denies pain, shortness of breath, or nausea. Jordan [name] shares the plan for his lawyer to visit tomorrow to complete all of his financial paperwork. We did discuss that his HCPOA document were completed yesterday.   Patients aunt shares the plan for a meeting Monday at 1PM for further conversations regarding goals of care.   I spoke to patients HCPOA, Jordan [name] over the phone. He shares that Jordan [name] has vocalized exhaustion with all of the difficult conversations we have had recently. He states the hope to meet Monday at 1PM with the PMT and Nephrology to determine next steps. I shared that I would reach out to the nephrology team to see if this will be possible.   Plan to meet on Monday at 1PM for further GOC determination.   Questions and concerns addressed/Palliative Support Provided.   Objective Assessment: Vital Signs Vitals:   09/27/23 0752 09/27/23 0826  BP: (!) 91/48   Pulse: 88   Resp: 18   Temp: 98.4 F (36.9 C)    SpO2: 94% 91%    Intake/Output Summary (Last 24 hours) at 09/27/2023 1409 Last data filed at 09/27/2023 1354 Gross per 24 hour  Intake 120 ml  Output 950 ml  Net -830 ml   Last Weight  Most recent update: 09/26/2023 12:24 PM    Weight  100.1 kg (220 lb 10.9 oz)            Gen: Elderly Caucasian male chronically ill in appearance HEENT: moist mucous membranes CV: Irregular rate and rhythm PULM: On 2 L nasal cannula breathing is even and unlabored ABD: soft/nontender EXT: Generalized edema Neuro: Alert and oriented to self and place though at times disoriented  SUMMARY OF RECOMMENDATIONS   DNAR/DNI   MOST / DNR Form Completed, paper copy placed onto the chart electric copy can be found in [employer]   Advanced Directives complete and scanned into [employer]  Plan for family meeting Monday at 1PM with the Nephrology service to determine goals moving forward    Ongoing palliative care support  Billing based on MDM: High ______________________________________________________________________________________ Jordan [name]  Palliative Medicine Team Team Cell Phone: [phone] Please utilize secure chat with additional questions, if there is no response within 30 minutes please call the above phone number  Palliative Medicine Team providers are available by phone from 7am to 7pm daily and can be reached through the team cell phone.  Should this patient require assistance outside of these hours, please call the patient's attending physician.

## 2023-09-27 NOTE — Progress Notes (Addendum)
 Patient Name: Jordan [name] Date of Encounter: 09/27/2023 [facility] Cardiologist: Donato Schultz, MD   Interval Summary  .    73 yr old male with PMH of non-obstructive CAD, HTN, HLD,  HFpEF, persistent A fib, aortic stenosis s/p TAVR 06/30/2020, chronic oxygen dependent COPD, tobacco use, stroke, type 2 DM, OSA, chronic venous insufficiency, recurrent cellulitis, chronic anemia, anxiety with depression, who has been hospitalized here for 25 days for left leg cellulitis completed antibiotic, course complicated by AKI that ultimately started on hemodialysis by nephrology, acute on chronic anemia requiring PRBC transfusion. Cardiology consulted on 09/24/23 for A fib RVR, complicated by hypotension.  This morning, patient not on telemetry. Denies focal complaints including shortness of breath, palpitations, lightheadedness/dizziness.   Vital Signs .    Vitals:   09/26/23 2030 09/27/23 0500 09/27/23 0752 09/27/23 0826  BP: 113/80 109/64 (!) 91/48   Pulse: 94 93 88   Resp: 18 18 18    Temp:  98.7 F (37.1 C) 98.4 F (36.9 C)   TempSrc:      SpO2:  97% 94% 91%  Weight:      Height:        Intake/Output Summary (Last 24 hours) at 09/27/2023 0837 Last data filed at 09/27/2023 1610 Gross per 24 hour  Intake 357 ml  Output 751.9 ml  Net -394.9 ml      09/26/2023   12:24 PM 09/26/2023    8:15 AM 09/25/2023    7:05 AM  Last 3 Weights  Weight (lbs) 220 lb 10.9 oz 224 lb 13.9 oz 223 lb 15.8 oz  Weight (kg) 100.1 kg 102 kg 101.6 kg      Telemetry/ECG    Not on telemetry - Personally Reviewed  Physical Exam .   GEN: No acute distress.   Neck: No JVD Cardiac: Irregularly irregular, no rubs, or gallops. Faint systolic murmur RUSB Respiratory: Clear to auscultation bilaterally. GI: Soft, nontender, non-distended  MS: minimal-trace nonpitting edema in bilateral LE. Chronic skin changes noted, consistent with patient's hx venous insufficiency  Assessment & Plan .     Persistent  atrial fibrillation Patient taking Atenolol and Eliquis prior to this admission. While hospitalized, Atenolol held due to hypotension. Eliquis also held in the setting of acute anemia requiring PRBC transfusion. In the setting of prolonged and complicated admission, patient developed afib with RVR. Started on Metoprolol Tartrate 25mg  BID on 4/6. HR remains well controlled today, continue Metoprolol Tartrate 25mg  BID Eliquis held due to anemia, hematuria, need for transfusion. Defer plan to resume Eliquis to urology.   Chronic HFpEF  Echo from 09/08/23 with LVEF 60-65%. During this admission, patient initially received IV lasix but subsequently with AKI, now on HD. Continue volume management with HD, appears euvolemic today HFpEF GDMT severely limited by renal failure.  AS s/p TAVR Per 03/21 TTE, stable prosthetic valve function.  Hx nonobstructive CAD Moderate non-obstructive CAD by [facility] 09/21. Continue Lipitor 80mg  No ASA with need for OAC and acute anemia  AKI on hemodialysis Hyponatremia Patient with AKI->initiation of dialysis this admission. Management per nephrology  Hematuria Patient with HGB down to 6.7 this admission. Even with PRBC transfusion, continues to be anemic with HGB 7.1 today. As above, cardiology will defer decision regarding resuming Eliquis to urology team.   Cellulitis Per primary team.  Chronic respiratory failure COPD Per primary team.  For questions or updates, please contact [facility] Please consult [email or web address] for contact info under     Signed, Clayburn Pert  Latanya Maudlin   ADDENDUM:   Patient seen and examined I personally taken a history, examined the patient, reviewed relevant notes,  laboratory data / imaging studies.  I performed a substantive portion of this encounter and formulated the important aspects of the plan.  I agree with the APP's note, impression, and recommendations; however, I have edited the note to reflect changes or  salient points.   Accompanied by his aunt at bedside. Denies anginal chest pain or heart failure symptoms. Denies palpitations. Continues to have gross hematuria on Foley catheter  PHYSICAL EXAM: Today's Vitals   09/27/23 0500 09/27/23 0742 09/27/23 0752 09/27/23 0826  BP: 109/64  (!) 91/48   Pulse: 93  88   Resp: 18  18   Temp: 98.7 F (37.1 C)  98.4 F (36.9 C)   TempSrc:      SpO2: 97%  94% 91%  Weight:      Height:      PainSc:  0-No pain     Body mass index is 29.12 kg/m.   Net IO Since Admission: -6,888.45 mL [09/27/23 1321]  Filed Weights   09/25/23 0705 09/26/23 0815 09/26/23 1224  Weight: 101.6 kg 102 kg 100.1 kg    Physical Exam  Constitutional: He appears chronically ill.  hemodynamically stable, no acute distress.   HENT:  Dry mucus membrane   Neck: No JVD present.  Right subclavian HD catheter  Cardiovascular: Normal rate, S1 normal and S2 normal. An irregularly irregular rhythm present. Exam reveals no gallop and no friction rub.  No murmur heard. Pulmonary/Chest: Breath sounds normal. He has no wheezes. He has no rales. He exhibits no tenderness.  Abdominal: Soft. Bowel sounds are normal. He exhibits no distension. There is no abdominal tenderness.  Urinary foley, gross hematuria.   Musculoskeletal:        General: Edema (trace bilaterally) present. No tenderness.     Comments: Chronic skin changes bilateral leg - suggestive of venous insufficiency/cellulitis    Neurological:  Alert to him,DOB, president. Not location.   Skin: Skin is warm and dry.   EKG: (personally reviewed by me) No new tracings  Telemetry: (personally reviewed by me) No longer on telemetry   Impression / Recommendations: :  Persistent atrial fibrillation with controlled ventricular rate Rate control strategy. Continue Lopressor 25 mg p.o. twice daily. Ventricular rates have improved since 09/24/2023 Currently not on antiarrhythmic medications. Currently not on  anticoagulation due to gross hematuria, symptomatic anemia requiring blood transfusions Hemoglobin continues to trend down -over the last 3 days.  Chronic heart failure with preserved EF States he, NYHA class II. Echocardiogram notes preserved LVEF as 60 to 65% Up titration of GDMT has been limited due to acute kidney injury during his hospitalization and currently on hemodialysis as well as soft blood pressures.  History of aortic stenosis status post TAVR: Stable.  Will need antibiotic prophylaxis  Acute kidney injury on chronic kidney disease started on hemodialysis: Nephrology follow-up  Hematuria Status post PRBC Anemia Management per primary and urology.  Cardiology was consulted for A-fib with RVR.  Patient has managed well with rate control strategy on the current dose of Lopressor.  Anticoagulation is currently held due to hematuria, downtrending hemoglobin levels over the last 72 hours despite PRBC transfusion.  Currently being followed by primary and urology.  Based on his clinical trajectory would recommend anticoagulation if anemia is resolved and source of bleeding has been identified and treated.  Please reach out to cardiology during this hospitalization  for further recommendations or concerns arise.  Further recommendations to follow as the case evolves.   This note was created using a voice recognition software as a result there may be grammatical errors inadvertently enclosed that do not reflect the nature of this encounter. Every attempt is made to correct such errors.   Tessa Lerner, DO, [facility]  [address] [place], [place] [address] Pager: [phone] Office: [phone] 09/27/2023

## 2023-09-27 NOTE — TOC Progression Note (Signed)
 Transition of Care [facility]) - Progression Note    Patient Details  Name: Jordan [name] MRN: [id number] Date of Birth: 1951/01/23  Transition of Care [facility] CM/SW Contact  Marliss Coots, LCSW Phone Number: 09/27/2023, 1:39 PM  Clinical Narrative:     1:39 PM Per covering Nephrologist, GOC discussions are still ongoing regarding comfort care vs dialysis.  Expected Discharge Plan: Skilled Nursing Facility Barriers to Discharge: Continued Medical Work up  Expected Discharge Plan and Services In-house Referral: Clinical Social Work   Post Acute Care Choice: Skilled Nursing Facility Living arrangements for the past 2 months: Assisted Living Facility                                       Social Determinants of Health (SDOH) Interventions SDOH Screenings   Food Insecurity: Patient Declined (09/01/2023)  Housing: Patient Declined (09/01/2023)  Transportation Needs: Patient Declined (09/01/2023)  Utilities: Patient Declined (09/01/2023)  Social Connections: Patient Declined (09/01/2023)  Tobacco Use: High Risk (09/01/2023)    Readmission Risk Interventions    09/04/2023    2:49 PM 04/04/2022   11:02 AM  Readmission Risk Prevention Plan  Transportation Screening Complete Complete  PCP or Specialist Appt within 5-7 Days Complete   PCP or Specialist Appt within 3-5 Days  Complete  Home Care Screening Complete   Medication Review (RN CM) Complete   HRI or Home Care Consult  Complete  Social Work Consult for Recovery Care Planning/Counseling  Complete  Palliative Care Screening  Not Applicable  Medication Review Oceanographer)  Complete

## 2023-09-27 NOTE — Progress Notes (Signed)
 [facility] Progress Note  Subjective:  Sleeping this AM. Pall care following, appreciate assistance  Vitals:   09/26/23 2030 09/27/23 0500 09/27/23 0752 09/27/23 0826  BP: 113/80 109/64 (!) 91/48   Pulse: 94 93 88   Resp: 18 18 18    Temp:  98.7 F (37.1 C) 98.4 F (36.9 C)   TempSrc:      SpO2:  97% 94% 91%  Weight:      Height:        Exam: Gen: NAD, [place] O2 CVS: tachy Resp: CTA Abd: +BS, soft, NT/ND Ext: still has 1+ bilat hip edema > pretib edema, also UE edema  Assessment/ Plan: AKI: b/l creat is 1.3, from PCP's office in Jan 2024. Assuming this is AKI but could also have CKD. UA shows some rbc's/ wbc's, no proteinuria. Renal US w/o obstruction. BP's were soft in 90s on presentation and then improved after IV abx and IVF's, but pt developed pulm edema on CXR #2 w/ bilat LE edema. Suspect AKI due to CHF/ cardiorenal. Started IV lasix 3/19 and titrated up the dose. Diuresed well x several days then BP dropped 3/23 so Lasix was decreased to 80 mg IV tid, then stopped.  UOP dropped but Scr stable. A renal CO2 angiogram was negative for renal artery stenosis. Then lasix was started po w/ increased UOP but unfortunately the Scr rose up to 3 then 4 then 6.  Repeat renal US showed no hydro, exam and CXR showed persistent fluid overload. IR placed temp cath 4/02. Pt was started on iHD 4/2, and got his 2nd HD 4/3 and 3rd session Saturday.  - next HD 09/26/23  - he is wheelchair/ bedbound at baseline- he is a very poor long-term dialysis candidate  - appreciate GOC--> discussed with Palliative and with pt's cousin, [name].  We discussed transitioning GOC.  Ongoing talks, for now continue HD Volume - diffuse LE edema and +CHF on all CXR's. Volume overload improving on exam, wt's are coming down. Cont to lower vol w/ HD. CXR today showing improvement (poor film).  BP - bp's soft, metoprolol was stopped. Tolerating UF w/ HD.  Atrial fib - metoprolol dc'd due to soft BP's. HR's  100-120s, cardiology consulting.  Gross hematuria: appreciate urology assistance.  Anemia - hgb down to 6.7. PRBC's ordered for today.  LLE cellulitis: SP IV abx then keflex, now off abx COPD/ chronic resp failure - on home O2 2 L S/p TAVR Disposition lives at [facility] MD 09/21/2023, 10:04 AM  Recent Labs  Lab 09/20/23 0355 09/20/23 1212 09/21/23 0315  HGB  --  8.0* 7.5*  ALBUMIN 2.1*  --  2.0*  CALCIUM 8.1*  --  7.9*  7.8*  PHOS 7.6*  --  4.4  CREATININE 6.23*  --  4.40*  4.39*  K 4.2  --  3.6  3.7    Inpatient medications:  atorvastatin  80 mg Oral QODAY   Chlorhexidine Gluconate Cloth  6 each Topical Q0600   Chlorhexidine Gluconate Cloth  6 each Topical Q0600   LORazepam  1 mg Oral BID   mometasone-formoterol  2 puff Inhalation BID   multivitamin with minerals  1 tablet Oral Q breakfast   pantoprazole  40 mg Oral Daily   sevelamer carbonate  800 mg Oral TID WC   sodium chloride flush  3 mL Intravenous Q12H   venlafaxine XR  75 mg Oral Q breakfast    albumin human     cefTRIAXone (  ROCEPHIN)  IV 2 g (09/20/23 1311)   acetaminophen **OR** acetaminophen, acetaminophen, albumin human, albuterol, alum & mag hydroxide-simeth, camphor-menthol, hydrALAZINE, melatonin, ondansetron (ZOFRAN) IV, mouth rinse, oxyCODONE, polyethylene glycol, sodium chloride flush, traMADol

## 2023-09-28 DIAGNOSIS — I5032 Chronic diastolic (congestive) heart failure: Secondary | ICD-10-CM | POA: Diagnosis not present

## 2023-09-28 DIAGNOSIS — I4819 Other persistent atrial fibrillation: Secondary | ICD-10-CM | POA: Diagnosis not present

## 2023-09-28 DIAGNOSIS — I872 Venous insufficiency (chronic) (peripheral): Secondary | ICD-10-CM | POA: Diagnosis not present

## 2023-09-28 DIAGNOSIS — N179 Acute kidney failure, unspecified: Secondary | ICD-10-CM | POA: Diagnosis not present

## 2023-09-28 LAB — CBC
HCT: 25.9 % — ABNORMAL LOW (ref 39.0–52.0)
Hemoglobin: 7.5 g/dL — ABNORMAL LOW (ref 13.0–17.0)
MCH: 24.7 pg — ABNORMAL LOW (ref 26.0–34.0)
MCHC: 29 g/dL — ABNORMAL LOW (ref 30.0–36.0)
MCV: 85.2 fL (ref 80.0–100.0)
Platelets: 272 10*3/uL (ref 150–400)
RBC: 3.04 MIL/uL — ABNORMAL LOW (ref 4.22–5.81)
RDW: 24.3 % — ABNORMAL HIGH (ref 11.5–15.5)
WBC: 8.9 10*3/uL (ref 4.0–10.5)
nRBC: 0 % (ref 0.0–0.2)

## 2023-09-28 LAB — BASIC METABOLIC PANEL WITH GFR
Anion gap: 10 (ref 5–15)
BUN: 46 mg/dL — ABNORMAL HIGH (ref 8–23)
CO2: 26 mmol/L (ref 22–32)
Calcium: 8.3 mg/dL — ABNORMAL LOW (ref 8.9–10.3)
Chloride: 96 mmol/L — ABNORMAL LOW (ref 98–111)
Creatinine, Ser: 5.57 mg/dL — ABNORMAL HIGH (ref 0.61–1.24)
GFR, Estimated: 10 mL/min — ABNORMAL LOW (ref >60)
Glucose, Bld: 94 mg/dL (ref 70–99)
Potassium: 4.3 mmol/L (ref 3.5–5.1)
Sodium: 132 mmol/L — ABNORMAL LOW (ref 135–145)

## 2023-09-28 LAB — RENAL FUNCTION PANEL
Albumin: 2.2 g/dL — ABNORMAL LOW (ref 3.5–5.0)
Anion gap: 11 (ref 5–15)
BUN: 45 mg/dL — ABNORMAL HIGH (ref 8–23)
CO2: 25 mmol/L (ref 22–32)
Calcium: 8.3 mg/dL — ABNORMAL LOW (ref 8.9–10.3)
Chloride: 95 mmol/L — ABNORMAL LOW (ref 98–111)
Creatinine, Ser: 5.45 mg/dL — ABNORMAL HIGH (ref 0.61–1.24)
GFR, Estimated: 10 mL/min — ABNORMAL LOW (ref >60)
Glucose, Bld: 95 mg/dL (ref 70–99)
Phosphorus: 4.7 mg/dL — ABNORMAL HIGH (ref 2.5–4.6)
Potassium: 4.2 mmol/L (ref 3.5–5.1)
Sodium: 131 mmol/L — ABNORMAL LOW (ref 135–145)

## 2023-09-28 LAB — MAGNESIUM: Magnesium: 2.1 mg/dL (ref 1.7–2.4)

## 2023-09-28 MED ORDER — HEPARIN SODIUM (PORCINE) 1000 UNIT/ML IJ SOLN
2000.0000 [IU] | Freq: Once | INTRAMUSCULAR | Status: AC
  Administered 2023-09-28: 2000 [IU]

## 2023-09-28 MED ORDER — HEPARIN SODIUM (PORCINE) 1000 UNIT/ML IJ SOLN: 2800.0000 [IU] | Freq: Once | INTRAMUSCULAR | Status: DC

## 2023-09-28 MED ORDER — ALBUMIN HUMAN 25 % IV SOLN
25.0000 g | Freq: Once | INTRAVENOUS | Status: AC
  Administered 2023-09-28: 25 g via INTRAVENOUS

## 2023-09-28 MED ORDER — HEPARIN SODIUM (PORCINE) 1000 UNIT/ML IJ SOLN
INTRAMUSCULAR | Status: AC
  Filled 2023-09-28: qty 3

## 2023-09-28 NOTE — Procedures (Signed)
 Patient seen and examined on Hemodialysis.  The procedure was supervised and I have made appropriate changes.  BP (!) 87/52   Pulse 85   Temp (!) 97.5 F (36.4 C)   Resp (!) 27   Ht 6\' 1"  (1.854 m)   Wt 100.1 kg   SpO2 100%   BMI 29.12 kg/m   QB 400 mL/min, UF goal 2L  BP low, albumin given.    Tolerating treatment without complaints at this time.   Bufford Buttner MD [facility] Pgr [phone] 10:53 AM

## 2023-09-28 NOTE — Progress Notes (Signed)
 PROGRESS NOTE  Jordan [name] ZOX:[id number] DOB: [birth year] DOA: 09/01/2023 PCP: Kennith Center, NP   LOS: 27 days   Brief narrative:  Jordan [name] is an 73 y.o. male with past medical history of hypertension, heart failure with preserved ejection fraction, atrial fibrillation, CAD, COPD with chronic hypoxic respiratory failure on 2 L of oxygen was brought into the hospital from skilled nursing facility with worsening left leg wound.  Patient with history of chronic venous stasis.  Patient stated that he was doing well until he got a new wheelchair last week and the nurses aide tried to put him in bed but lost her balance and he scraped his leg and his skin came off.  In the ED, patient was noted to have increased erythema over the denuded part of the skin.  WBC was elevated at 14, creatinine at 2.4 and blood pressure was 104/60.  Heart rate was 115.  Patient was started on vancomycin and cefepime and was admitted hospital for further evaluation and treatment.    During hospitalization, patient had atrial fibrillation with RVR and cardiology followed the patient.  He has been seen by nephrology and has been initiated on hemodialysis pending renal improvement.  Urology also followed the patient due to hematuria.  Patient will need  skilled nursing facility placement  when determination on dialysis has been made.  Assessment and plan.  Principal Problem:   Left leg cellulitis Active Problems:   Acute renal failure (ARF) (HCC)   Gross hematuria   Hypotension   Pleural effusion on right   Essential hypertension   HLD (hyperlipidemia)   Chronic venous stasis dermatitis of both lower extremities   COPD (chronic obstructive pulmonary disease) (HCC)   S/P TAVR (transcatheter aortic valve replacement)   Chronic heart failure with preserved ejection fraction (HCC)   Persistent atrial fibrillation (HCC)   Chronic respiratory failure with hypoxia (HCC)   Hyponatremia  * Atrial  fibrillation with RVR, history of persistent atrial fibrillation  Patient was was on atenolol prior to discharge.  Cardiology following.  Continue metoprolol and add midodrine if needed.   Eliquis on hold due to hematuria.  Rate controlled at this time.   Acute renal failure Creatinine today at 6.1.  Seen by nephrology and patient has been started on hemodialysis during hospitalization.   Nephrology following the patient.  Further plans as per nephrology.  Patient likely to be a long-term poor hemodialysis candidate.  Goals of care discussion undergoing.  Plan is to meet with family and palliative care at some point to decide on further course of action.  Hyponatremia. Being followed by nephrology likely secondary to renal failure.  Latest sodium of 132.  Mild hypokalemia.  Will give 40 meq po x 1 of KCL today.  Left leg cellulitis Status post 10-day Keflex and 5-day course of doxycycline.  Patient also was seen by vascular surgery and underwent arteriogram on 325/25 with no significant peripheral vascular disease. Improving.  Patient will need to follow-up with  Dr Lajoyce Corners as outpatient.  Hematuria Improving.  Hemoglobin at 7.5 today.  Seen by urology and Eliquis on hold.  CT cystogram of the abdomen and pelvis due to hematuria was done which showed 3.8 x 1.5 x 2.7 cm focal disproportionate wall thickening along the superior wall of the bladder with trabeculation and small diverticuli.   Will need to follow-up with [facility].  Acute blood loss anemia secondary to hematuria.  Received 1 unit of packed RBC during hospitalization.  Hemoglobin today at 7.5  Pleural effusion on right On 3 L of oxygen.  Continue hemodialysis for fluid management.   Hypotension Blood pressure low during hemodialysis.   Chronic respiratory failure with hypoxia   Currently on 3 L oxygen.    Chronic heart failure with preserved ejection fraction Patient had large right pleural effusion on CXR.  Continue volume  management as per hemodialysis.  On 3 L of oxygen by nasal cannula.  S/P TAVR (transcatheter aortic valve replacement) Stable.   COPD (chronic obstructive pulmonary disease) Continue nebulizers as needed.    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 Continue with wound care.  Pressure injury left heel unstageable.  Present on admission.  Continue offloading and wound care if necessary. Pressure Injury 09/02/23 Heel Left Unstageable - Full thickness tissue loss in which the base of the injury is covered by slough (yellow, tan, gray, green or brown) and/or eschar (tan, brown or black) in the wound bed. (Active)  09/02/23 1713  Location: Heel  Location Orientation: Left  Staging: Unstageable - Full thickness tissue loss in which the base of the injury is covered by slough (yellow, tan, gray, green or brown) and/or eschar (tan, brown or black) in the wound bed.  Wound Description (Comments):   Present on Admission: Yes     Hyperlipidemia Chronic. On lipitor 80 mg every other day.   Essential hypertension Holding antihypertensives for now, blood pressure marginally low.  Debility, deconditioning.  Has been bed bound for 6 months.  Patient is from assisted living.  Will need skilled nursing facility likely on discharge.  DVT prophylaxis: Place and maintain sequential compression device Start: 09/19/23 1109   Disposition: Likely to skilled nursing facility when clinically improved and okay with nephrology.  Status is: Inpatient  Remains inpatient appropriate because: pending clinical improvement,  new hemodialysis, need for skilled nursing facility placement.,  Determination of dialysis needs goals of care discussion.   Code Status:     Code Status: Limited: Do not attempt resuscitation (DNR) -DNR-LIMITED -Do Not Intubate/DNI   Family Communication:   I spoke with the patient's aunt on the phone on 09/24/2023   Consultants: Vascular  surgery Nephrology urology  Cardiology Palliative care  Procedures:  Abdominal Aortogram on 09-12-2023 Foley catheter placement Right internal jugular triple-lumen catheter on 09/20/2023 by interventional radiology hemodialysis  Anti-infectives:  Rocephin IV-completed  Subjective:  Today, patient was seen and examined at bedside.  Seen during hemodialysis.  Noted to have low blood pressure.  Denies any pain, nausea, vomiting.  Has mild shortness of breath.  Feels sleepy.  Objective: Vitals:   09/28/23 1000 09/28/23 1030  BP: (!) 87/49 (!) 87/52  Pulse: 93 85  Resp: 20 (!) 27  Temp:    SpO2: 100% 100%    Intake/Output Summary (Last 24 hours) at 09/28/2023 1041 Last data filed at 09/28/2023 0600 Gross per 24 hour  Intake 240 ml  Output 350 ml  Net -110 ml   Filed Weights   09/25/23 0705 09/26/23 0815 09/26/23 1224  Weight: 101.6 kg 102 kg 100.1 kg   Body mass index is 29.12 kg/m.   Physical Exam:  GENERAL: Patient is mildly somnolent. Not in obvious distress.  Obese built.  Appears weak and deconditioned, on nasal cannula oxygen  HENT: No scleral pallor or icterus. Pupils equally reactive to light. Oral mucosa is moist NECK: is supple, no gross swelling noted.  Right internal jugular hemodialysis catheter in place. CHEST:   Diminished breath sounds  bilaterally. CVS: S1 and S2 heard, no murmur.  Rate controlled at this time ABDOMEN: Soft, non-tender, bowel sounds are present.  Foley catheter with slightly blood tinged urine. EXTREMITIES bilateral lower extremity edema, mild erythema CNS: Cranial nerves are intact.  Generalized weakness noted.  Moves all extremities. SKIN: warm and dry, left heel pressure ulceration present on admission  Data Review: I have personally reviewed the following laboratory data and studies,  CBC: Recent Labs  Lab 09/24/23 0339 09/25/23 0258 09/26/23 0227 09/27/23 0304 09/28/23 0357  WBC 8.8 8.3 7.8 7.9 8.9  HGB 7.9* 8.0* 7.5*  7.1* 7.5*  HCT 27.1* 27.3* 25.4* 24.3* 25.9*  MCV 82.9 83.5 83.3 83.8 85.2  PLT 279 262 278 253 272   Basic Metabolic Panel: Recent Labs  Lab 09/23/23 0237 09/24/23 0339 09/25/23 0258 09/26/23 0227 09/27/23 0304 09/28/23 0357  NA 131* 130*  130* 130* 131* 132*  132* 132*  131*  K 3.8 3.5  3.5 3.7 3.7 3.4*  3.4* 4.3  4.2  CL 94* 95*  94* 94* 95* 97*  96* 96*  95*  CO2 27 25  25 28 26 27  27 26  25   GLUCOSE 98 102*  101* 87 94 105*  104* 94  95  BUN 44* 30*  30* 43* 53* 34*  34* 46*  45*  CREATININE 5.31* 3.91*  3.91* 5.31* 6.17* 4.37*  4.37* 5.57*  5.45*  CALCIUM 8.2* 8.1*  8.1* 8.1* 8.4* 8.0*  8.0* 8.3*  8.3*  MG 1.9 1.9 2.1  --  2.0 2.1  PHOS 4.6 3.3 4.1 4.3 3.3 4.7*   Liver Function Tests: Recent Labs  Lab 09/24/23 0339 09/25/23 0258 09/26/23 0227 09/27/23 0304 09/28/23 0357  ALBUMIN 2.1* 2.0* 2.0* 2.2* 2.2*   No results for input(s): "LIPASE", "AMYLASE" in the last 168 hours. No results for input(s): "AMMONIA" in the last 168 hours. Cardiac Enzymes: No results for input(s): "CKTOTAL", "CKMB", "CKMBINDEX", "TROPONINI" in the last 168 hours. BNP (last 3 results) No results for input(s): "BNP" in the last 8760 hours.  ProBNP (last 3 results) No results for input(s): "PROBNP" in the last 8760 hours.  CBG: Recent Labs  Lab 09/25/23 2033 09/27/23 0753  GLUCAP 101* 91   No results found for this or any previous visit (from the past 240 hours).   Studies: No results found.      Joycelyn Das, MD  [facility] 09/28/2023  If 7PM-7AM, please contact night-coverage

## 2023-09-28 NOTE — Progress Notes (Signed)
 Peripherally following the patient.  Cardiology consulted during his hospitalization for A-fib with RVR complicated by hypotension.  Ventricular rates have improved with initiation of Lopressor 25 mg p.o. twice daily with holding parameters.  Uptitration of AV nodal blocking agents is difficult due to soft/labile blood pressures.   CHA2DS2-VASc SCORE is high; however, currently not on anticoagulation due to gross hematuria, symptomatic anemia requiring blood transfusions, and downtrending hemoglobin levels.  After discussing the risks, benefits with the patient he participated in shared decision with regards to holding anticoagulation for now and to reevaluate once hemoglobin is more stable and hematuria is resolved.    Of note, patient is wheelchair/bedbound at baseline, felt to be a poor long-term dialysis candidate per nephrology.  Palliative care has been consulted to help guide goals of care discussions.  This is quite appropriate.  For now would recommend rate control strategy for his underlying A-fib.  Anticoagulation held for now as the risks of bleeding is greater than the benefit.  Should be reevaluated prior to discharge.  Recommendations provided to Dr. Tyson Babinski who agrees with the plan of care and will reach out if needed.  Cardiology will sign off for now as patient is hemodynamically stable and A-fib is rate controlled. Please reach out in the interim if change in clinical status or closer to discharge to further discuss anticoagulation if needed.  No charge.  Shadeed Colberg [place], DO, [facility] 09/28/23

## 2023-09-28 NOTE — Progress Notes (Signed)
   09/28/23 1230  Vitals  Temp (!) 97.5 F (36.4 C)  Pulse Rate 85  Resp 18  BP (!) 97/51  SpO2 100 %  O2 Device Nasal Cannula  Weight 100.1 kg  Type of Weight Post-Dialysis  Oxygen Therapy  O2 Flow Rate (L/min) 3 L/min  Patient Activity (if Appropriate) In bed  Pulse Oximetry Type Continuous  Oximetry Probe Site Changed No  Post Treatment  Dialyzer Clearance Lightly streaked  Hemodialysis Intake (mL) 0 mL  Liters Processed 72  Fluid Removed (mL) 0 mL  Tolerated HD Treatment Yes   Received patient in bed to unit.  Alert and oriented.  Informed consent signed and in chart.   TX duration: 3 hours  Patient tolerated well.  Transported back to the room  Alert, without acute distress.  Hand-off given to patient's nurse.   Access used: Right internal jugular HD catheter Access issues: None Medication(s) given: 25g Albumin

## 2023-09-28 NOTE — Progress Notes (Signed)
   09/28/23 0900  Vitals  Pulse Rate 94  Resp 16  BP (!) 83/46  SpO2 100 %  O2 Device Nasal Cannula  Oxygen Therapy  O2 Flow Rate (L/min) 3 L/min  Patient Activity (if Appropriate) In bed  Pulse Oximetry Type Continuous  Oximetry Probe Site Changed No  During Treatment Monitoring  Blood Flow Rate (mL/min) 399 mL/min  Arterial Pressure (mmHg) -112.32 mmHg  Venous Pressure (mmHg) 27.67 mmHg  TMP (mmHg) -0.8 mmHg  Ultrafiltration Rate (mL/min) 0 mL/min  Dialysate Flow Rate (mL/min) 299 ml/min  Dialysate Potassium Concentration 3  Dialysate Calcium Concentration 2.5  Duration of HD Treatment -hour(s) 0 hour(s)  Cumulative Fluid Removed (mL) per Treatment  -300  HD Safety Checks Performed Yes  Intra-Hemodialysis Comments Progressing as prescribed;See progress note (Blood pressure low nurse aware)  Dialysis Fluid Bolus Normal Saline  Bolus Amount (mL) 300 mL   Albumin 25g administered at start of treatment. UF removal is currently off.

## 2023-09-28 NOTE — Progress Notes (Signed)
   09/28/23 0845  Vitals  Temp (!) 97.5 F (36.4 C)  Pulse Rate 97  Resp 17  BP (!) 84/53  SpO2 99 %  O2 Device Nasal Cannula  Oxygen Therapy  O2 Flow Rate (L/min) 3 L/min  Patient Activity (if Appropriate) In bed  Pulse Oximetry Type Continuous  Oximetry Probe Site Changed Yes  Pre Treatment  Is pt a NEW START this admission?  Yes  Vascular access used during treatment Catheter  HD catheter dressing before treatment WDL  Patient is receiving dialysis in a chair No  Hemodialysis Consent Verified Yes  Hemodialysis Standing Orders Initiated Yes  ECG (Telemetry) Monitor On Yes  Prime Ordered Normal Saline  Length of  DialysisTreatment -hour(s) 3 Hour(s)  Dialysis mode HD  Dialysis Treatment Comments 2.5-3L if SBP >90  Dialyzer Revaclear 400  Dialysate 3K;2.5 Ca  Dialysis Anticoagulation Heparin bolus  Dialysate Flow Ordered 300  Blood Flow Rate Ordered 400 mL/min  Ultrafiltration Goal 3000 Liters  Dialysis Blood Pressure Support Ordered Normal Saline   PA-Stovall notified of pt first blood pressure reading on unit. PA-Stovall ordered 25g albumin at start of treatment, updated MD-Upton per [facility] request.

## 2023-09-28 NOTE — Progress Notes (Signed)
 [facility] Progress Note   Assessment/ Plan:   AKI: b/l creat is 1.3, from PCP's office in Jan 2024. Assuming this is AKI but could also have CKD. UA shows some rbc's/ wbc's, no proteinuria. Renal US w/o obstruction. BP's were soft in 90s on presentation and then improved after IV abx and IVF's, but pt developed pulm edema on CXR #2 w/ bilat LE edema. Suspect AKI due to CHF/ cardiorenal. Started IV lasix 3/19 and titrated up the dose. Diuresed well x several days then BP dropped 3/23 so Lasix was decreased to 80 mg IV tid, then stopped.  UOP dropped but Scr stable. A renal CO2 angiogram was negative for renal artery stenosis. Then lasix was started po w/ increased UOP but unfortunately the Scr rose up to 3 then 4 then 6.  Repeat renal US showed no hydro, exam and CXR showed persistent fluid overload. IR placed temp cath 4/02. Pt was started on iHD 4/2, and got his 2nd HD 4/3 and 3rd session Saturday.             - next HD 09/28/23             - he is wheelchair/ bedbound at baseline- he is a very poor long-term dialysis candidate             - appreciate GOC--> discussed with Palliative and with pt's cousin, [name].  We discussed transitioning GOC.  Ongoing talks, for now continue HD  - has a nontunneled HD cath in place- if long-term dialysis is desired will need to do [facility] Volume - diffuse LE edema and +CHF on all CXR's. Volume overload improving on exam, wt's are coming down. Cont to lower vol w/ HD. CXR today showing improvement (poor film).  BP - bp's soft, metoprolol was stopped. Tolerating UF w/ HD.  Atrial fib - metoprolol dc'd due to soft BP's. HR's 100-120s, cardiology consulting.  Gross hematuria: appreciate urology assistance.  Anemia - hgb down to 6.7. PRBC's ordered for today.  LLE cellulitis: SP IV abx then keflex, now off abx COPD/ chronic resp failure - on home O2 2 L S/p TAVR Disposition lives at [facility]   Subjective:    Still confused this AM but in good  spirits.  No big issues expressed   Objective:   BP (!) 87/52   Pulse 85   Temp (!) 97.5 F (36.4 C)   Resp (!) 27   Ht 6\' 1"  (1.854 m)   Wt 100.1 kg   SpO2 100%   BMI 29.12 kg/m   Physical Exam: Gen: NAD, [place] O2 CVS: tachy Resp: CTA Abd: +BS, soft, NT/ND Ext: still has 1+ bilat hip edema > pretib edema, also UE edema ACCESS: R nontunneled HD cath  Labs: BMET Recent Labs  Lab 09/22/23 0340 09/23/23 0237 09/24/23 0339 09/25/23 0258 09/26/23 0227 09/27/23 0304 09/28/23 0357  NA 131*  131* 131* 130*  130* 130* 131* 132*  132* 132*  131*  K 3.6  3.6 3.8 3.5  3.5 3.7 3.7 3.4*  3.4* 4.3  4.2  CL 95*  95* 94* 95*  94* 94* 95* 97*  96* 96*  95*  CO2 26  25 27 25  25 28 26 27  27 26  25   GLUCOSE 107*  106* 98 102*  101* 87 94 105*  104* 94  95  BUN 36*  34* 44* 30*  30* 43* 53* 34*  34* 46*  45*  CREATININE 3.53*  3.63* 5.31* 3.91*  3.91* 5.31* 6.17* 4.37*  4.37* 5.57*  5.45*  CALCIUM 7.8*  7.9* 8.2* 8.1*  8.1* 8.1* 8.4* 8.0*  8.0* 8.3*  8.3*  PHOS 3.7 4.6 3.3 4.1 4.3 3.3 4.7*   CBC Recent Labs  Lab 09/25/23 0258 09/26/23 0227 09/27/23 0304 09/28/23 0357  WBC 8.3 7.8 7.9 8.9  HGB 8.0* 7.5* 7.1* 7.5*  HCT 27.3* 25.4* 24.3* 25.9*  MCV 83.5 83.3 83.8 85.2  PLT 262 278 253 272      Medications:     atorvastatin  80 mg Oral QODAY   Chlorhexidine Gluconate Cloth  6 each Topical Q0600   Chlorhexidine Gluconate Cloth  6 each Topical Q0600   feeding supplement (NEPRO CARB STEADY)  237 mL Oral BID BM   LORazepam  1 mg Oral BID   metoprolol tartrate  25 mg Oral BID   mometasone-formoterol  2 puff Inhalation BID   multivitamin with minerals  1 tablet Oral Q breakfast   pantoprazole  40 mg Oral Daily   sevelamer carbonate  800 mg Oral TID WC   sodium chloride flush  3 mL Intravenous Q12H   venlafaxine XR  75 mg Oral Q breakfast     Bufford Buttner MD 09/28/2023, 10:51 AM

## 2023-09-28 NOTE — Progress Notes (Signed)
 PT Cancellation Note  Patient Details Name: Jordan [name] MRN: [id number] DOB: 13-Nov-1950   Cancelled Treatment:    Reason Eval/Treat Not Completed: Patient at procedure or test/unavailable (HD). Will check back as time allows.   Lyanne Co, PT  Acute Rehab Services Secure chat preferred Office [phone]    Elyse Hsu 09/28/2023, 8:37 AM

## 2023-09-29 DIAGNOSIS — I4819 Other persistent atrial fibrillation: Secondary | ICD-10-CM | POA: Diagnosis not present

## 2023-09-29 DIAGNOSIS — N179 Acute kidney failure, unspecified: Secondary | ICD-10-CM | POA: Diagnosis not present

## 2023-09-29 DIAGNOSIS — Z952 Presence of prosthetic heart valve: Secondary | ICD-10-CM | POA: Diagnosis not present

## 2023-09-29 DIAGNOSIS — I5032 Chronic diastolic (congestive) heart failure: Secondary | ICD-10-CM | POA: Diagnosis not present

## 2023-09-29 LAB — CBC
HCT: 23.5 % — ABNORMAL LOW (ref 39.0–52.0)
HCT: 28.4 % — ABNORMAL LOW (ref 39.0–52.0)
Hemoglobin: 6.9 g/dL — CL (ref 13.0–17.0)
Hemoglobin: 8.5 g/dL — ABNORMAL LOW (ref 13.0–17.0)
MCH: 25.1 pg — ABNORMAL LOW (ref 26.0–34.0)
MCH: 25 pg — ABNORMAL LOW (ref 26.0–34.0)
MCHC: 29.4 g/dL — ABNORMAL LOW (ref 30.0–36.0)
MCHC: 29.9 g/dL — ABNORMAL LOW (ref 30.0–36.0)
MCV: 85.5 fL (ref 80.0–100.0)
MCV: 83.5 fL (ref 80.0–100.0)
Platelets: 229 10*3/uL (ref 150–400)
Platelets: 265 10*3/uL (ref 150–400)
RBC: 2.75 MIL/uL — ABNORMAL LOW (ref 4.22–5.81)
RBC: 3.4 MIL/uL — ABNORMAL LOW (ref 4.22–5.81)
RDW: 24 % — ABNORMAL HIGH (ref 11.5–15.5)
RDW: 23.2 % — ABNORMAL HIGH (ref 11.5–15.5)
WBC: 8.3 10*3/uL (ref 4.0–10.5)
WBC: 9.6 10*3/uL (ref 4.0–10.5)
nRBC: 0 % (ref 0.0–0.2)
nRBC: 0 % (ref 0.0–0.2)

## 2023-09-29 LAB — RENAL FUNCTION PANEL
Albumin: 2.3 g/dL — ABNORMAL LOW (ref 3.5–5.0)
Anion gap: 8 (ref 5–15)
BUN: 32 mg/dL — ABNORMAL HIGH (ref 8–23)
CO2: 26 mmol/L (ref 22–32)
Calcium: 8.1 mg/dL — ABNORMAL LOW (ref 8.9–10.3)
Chloride: 96 mmol/L — ABNORMAL LOW (ref 98–111)
Creatinine, Ser: 4.2 mg/dL — ABNORMAL HIGH (ref 0.61–1.24)
GFR, Estimated: 14 mL/min — ABNORMAL LOW (ref >60)
Glucose, Bld: 92 mg/dL (ref 70–99)
Phosphorus: 3.1 mg/dL (ref 2.5–4.6)
Potassium: 3.8 mmol/L (ref 3.5–5.1)
Sodium: 130 mmol/L — ABNORMAL LOW (ref 135–145)

## 2023-09-29 LAB — PREPARE RBC (CROSSMATCH)

## 2023-09-29 LAB — MAGNESIUM: Magnesium: 1.9 mg/dL (ref 1.7–2.4)

## 2023-09-29 MED ORDER — SODIUM CHLORIDE 0.9% IV SOLUTION: Freq: Once | INTRAVENOUS | Status: DC

## 2023-09-29 MED ORDER — FUROSEMIDE 40 MG PO TABS
40.0000 mg | ORAL_TABLET | Freq: Two times a day (BID) | ORAL | Status: DC
  Administered 2023-09-29 – 2023-10-02 (×6): 40 mg via ORAL
  Filled 2023-09-29 (×6): qty 1

## 2023-09-29 MED ORDER — SODIUM CHLORIDE 0.9% IV SOLUTION: Freq: Once | INTRAVENOUS | Status: AC

## 2023-09-29 NOTE — Progress Notes (Signed)
   17 Days Post-Op Subjective: No acute events overnight.  Patient extremely pleasant as usual.  Objective: Vital signs in last 24 hours: Temp:  [97.5 F (36.4 C)-98.6 F (37 C)] 98.3 F (36.8 C) (04/11 0933) Pulse Rate:  [59-106] 106 (04/11 0933) Resp:  [15-22] 22 (04/11 0933) BP: (83-149)/(46-75) 101/67 (04/11 0933) SpO2:  [93 %-100 %] 100 % (04/11 0933) Weight:  [100.1 kg] 100.1 kg (04/10 1230)  Assessment/Plan: #hematuria Eliquis held.  As patient is rate controlled, anticoagulation can stay off.  Will be reevaluated prior to discharge per cardiology.  Pt cannot tolerate CT hematuria d/t ESRD  S/p Rocephin  Trend labs. Transfuse if HgB < 7.0  CT cystogram reveals 3.8 x 1.5 x 2.7 cm focal disproportionate wall thickening along the superior wall of the bladder, trabeculation with scattered small diverticulum. Outpatient cystoscopy. Over 60y smoking hx. Improving lymphadenopathy.  Palliative/goals of care- 09/25/21. DNR/DNI.  Family meeting with nephrology on Monday to proceed with further discussions.  Patient is considering discontinuing dialysis.  Will plan to check on him again towards the beginning of the week.  Please call with questions or acute changes.    Intake/Output from previous day: 04/10 0701 - 04/11 0700 In: -  Out: 825 [Urine:825]  Intake/Output this shift: Total I/O In: 243 [P.O.:240; I.V.:3] Out: 100 [Urine:100]  Physical Exam:  General: Alert and oriented CV: No cyanosis Lungs: equal chest rise Abdomen: Soft, NTND, no rebound or guarding Gu: foley in place draining lightly blood tinged urine  Lab Results: Recent Labs    09/27/23 0304 09/28/23 0357 09/29/23 0338  HGB 7.1* 7.5* 6.9*  HCT 24.3* 25.9* 23.5*   BMET Recent Labs    09/28/23 0357 09/29/23 0338  NA 132*  131* 130*  K 4.3  4.2 3.8  CL 96*  95* 96*  CO2 26  25 26   GLUCOSE 94  95 92  BUN 46*  45* 32*  CREATININE 5.57*  5.45* 4.20*  CALCIUM 8.3*  8.3* 8.1*      Studies/Results: No results found.    LOS: 28 days   Elmon Kirschner, NP [facility] Pager: [phone]  09/29/2023, 10:51 AM

## 2023-09-29 NOTE — Progress Notes (Signed)
 [facility] Progress Note   Assessment/ Plan:   AKI: b/l creat is 1.3, from PCP's office in Jan 2024. Assuming this is AKI but could also have CKD. UA shows some rbc's/ wbc's, no proteinuria. Renal US w/o obstruction. BP's were soft in 90s on presentation and then improved after IV abx and IVF's, but pt developed pulm edema on CXR #2 w/ bilat LE edema. Suspect AKI due to CHF/ cardiorenal. Started IV lasix 3/19 and titrated up the dose. Diuresed well x several days then BP dropped 3/23 UOP dropped. A renal CO2 angiogram was negative for renal artery stenosis.unfortunately the Scr rose up to 3 then 4 then 6.  Repeat renal US showed no hydro, exam and CXR showed persistent fluid overload. IR placed temp cath 4/02. Pt was started on iHD 4/2, and got his 2nd HD 4/3 and 3rd session 4/5             -  HD  4/8 and 09/28/23             - he is wheelchair/ bedbound at baseline- he is a very poor long-term dialysis candidate             - appreciate GOC--> discussed with Palliative and with pt's cousin, [name].  We discussed GOC.  Ongoing talks, for now continue HD  - has a nontunneled HD cath in place for 9 days- if long-term dialysis is desired would need to do [facility]  For now continue to follow daily for HD need-  possibly with increased UOP-  may be signaling recovery   Volume - diffuse LE edema and +CHF on all CXR's. Volume overload improving on exam, UOP picking up-  will add some lasix BP - bp's soft, metoprolol was stopped. Tolerating UF w/ HD.  Atrial fib - metoprolol dc'd due to soft BP's. HR's 100-120s, cardiology consulting.  Gross hematuria: appreciate urology assistance.  Anemia - chronic issue. PRBC's ordered for today.  LLE cellulitis: SP IV abx then keflex, now off abx COPD/ chronic resp failure - on home O2 2 L S/p TAVR Disposition lives at [facility]   Subjective:    Still confused this AM but in good spirits.  Labs reflective of HD yest -  eating lunch -  tells me its  great -  did have 825 of UOP   Objective:   BP 101/67   Pulse (!) 106   Temp 98.3 F (36.8 C) (Oral)   Resp (!) 22   Ht 6\' 1"  (1.854 m)   Wt 100.1 kg   SpO2 100%   BMI 29.12 kg/m   Physical Exam: Gen: NAD, [place] O2-  happy -  still confused-  said wants to call his mother  CVS: tachy Resp: CTA Abd: +BS, soft, NT/ND Ext: still has 1+ bilat hip edema > pretib edema, also UE edema ACCESS: R nontunneled HD cath  Labs: BMET Recent Labs  Lab 09/23/23 0237 09/24/23 0339 09/25/23 0258 09/26/23 0227 09/27/23 0304 09/28/23 0357 09/29/23 0338  NA 131* 130*  130* 130* 131* 132*  132* 132*  131* 130*  K 3.8 3.5  3.5 3.7 3.7 3.4*  3.4* 4.3  4.2 3.8  CL 94* 95*  94* 94* 95* 97*  96* 96*  95* 96*  CO2 27 25  25 28 26 27  27 26  25 26   GLUCOSE 98 102*  101* 87 94 105*  104* 94  95 92  BUN 44* 30*  30* 43*  53* 34*  34* 46*  45* 32*  CREATININE 5.31* 3.91*  3.91* 5.31* 6.17* 4.37*  4.37* 5.57*  5.45* 4.20*  CALCIUM 8.2* 8.1*  8.1* 8.1* 8.4* 8.0*  8.0* 8.3*  8.3* 8.1*  PHOS 4.6 3.3 4.1 4.3 3.3 4.7* 3.1   CBC Recent Labs  Lab 09/26/23 0227 09/27/23 0304 09/28/23 0357 09/29/23 0338  WBC 7.8 7.9 8.9 8.3  HGB 7.5* 7.1* 7.5* 6.9*  HCT 25.4* 24.3* 25.9* 23.5*  MCV 83.3 83.8 85.2 85.5  PLT 278 253 272 229      Medications:     sodium chloride   Intravenous Once   atorvastatin  80 mg Oral QODAY   Chlorhexidine Gluconate Cloth  6 each Topical Q0600   feeding supplement (NEPRO CARB STEADY)  237 mL Oral BID BM   heparin sodium (porcine)  2,800 Units Intravenous Once   LORazepam  1 mg Oral BID   metoprolol tartrate  25 mg Oral BID   mometasone-formoterol  2 puff Inhalation BID   multivitamin with minerals  1 tablet Oral Q breakfast   pantoprazole  40 mg Oral Daily   sevelamer carbonate  800 mg Oral TID WC   sodium chloride flush  3 mL Intravenous Q12H   venlafaxine XR  75 mg Oral Q breakfast     Zera Markwardt A Kurtiss Wence  09/29/2023, 12:18 PM

## 2023-09-29 NOTE — Progress Notes (Signed)
 Occupational Therapy Treatment Patient Details Name: Jordan [name] MRN: [id number] DOB: 05/13/1951 Today's Date: 09/29/2023   History of present illness 73 yo male admitted with soft tissue infection/L LE wound, L toe laceration after sustaining a fall at ALF while be transferred back to bed per pt report. Hx of TAVR, bil LE edema, R TKA 07/2021, DM, Afib, anemia, anxiety, CHF, COPD-on O2 PRN per pt, obesity, aortic stenosis, venous insufficiency   OT comments  Pt wakes easily to voice, and willing to work with therapy today. Max A at bed level for clean gown and socks. Pt max A +2 to come EOB. Once EOB declined desire to perform grooming tasks. Able to sit EOB for short time with initially very strong posterior lean. With time and cues Pt able to improve seated balance for engaging in challenge of balance/functional tasks to brief moments of GCA for balance. Pt increasingly complaining of weakness/light headedness and so opted to return Pt supine with max A +2. POC remains appropriate. Goals updated this session and acknowledge GOC meeting Monday at 1pm. OT will continue to follow acutely. Next session plan on recording orthostatic pressure.      If plan is discharge home, recommend the following:  Two people to help with walking and/or transfers;Two people to help with bathing/dressing/bathroom;Assist for transportation;Direct supervision/assist for medications management;Help with stairs or ramp for entrance   Equipment Recommendations  None recommended by OT    Recommendations for Other Services      Precautions / Restrictions Precautions Precautions: Fall;Other (comment) Precaution/Restrictions Comments: multiple LE wounds-tender to touch; chronic 2L O2 at baseline Restrictions Weight Bearing Restrictions Per Provider Order: No       Mobility Bed Mobility Overal bed mobility: Needs Assistance Bed Mobility: Supine to Sit Rolling: Max assist, +2 for physical assistance, Used  rails   Supine to sit: Max assist, HOB elevated, Used rails Sit to supine: Max assist, +2 for physical assistance, HOB elevated   General bed mobility comments: Assist for all aspects of transitioning to/from EOB. Pt able to initiate LE movement, however required assist to complete. Heavy +2 assist for trunk elevation to full sitting position. Bed pad utilized for scooting to get feet on the floor and for scooting up in bed at end of session.    Transfers                   General transfer comment: Attempted to place [facility] however pt reporting lightheadedness and with increased difficulty holding upright posture. Opted for return to supine.     Balance Overall balance assessment: Needs assistance Sitting-balance support: Bilateral upper extremity supported, Feet supported Sitting balance-Leahy Scale: Fair Sitting balance - Comments: posterior lean in sitting, needs mod A to maintain Postural control: Posterior lean                                 ADL either performed or assessed with clinical judgement   ADL Overall ADL's : Needs assistance/impaired     Grooming: Wash/dry hands;Wash/dry face;Contact guard assist;Sitting Grooming Details (indicate cue type and reason): sitting EOB         Upper Body Dressing : Maximal assistance;Cueing for sequencing;Cueing for compensatory techniques;Cueing for safety;Bed level Upper Body Dressing Details (indicate cue type and reason): new gown prior to initiation bed mobility Lower Body Dressing: Total assistance;Bed level Lower Body Dressing Details (indicate cue type and reason): Pt reports this is his  baseline at [facility].             Functional mobility during ADLs: Maximal assistance;+2 for physical assistance;+2 for safety/equipment;Cueing for safety;Cueing for sequencing General ADL Comments: deconditioned, decreased tolerance for positional changes    Extremity/Trunk Assessment Upper Extremity  Assessment Upper Extremity Assessment: Generalized weakness   Lower Extremity Assessment Lower Extremity Assessment: Defer to PT evaluation        Vision       Perception     Praxis     Communication Communication Communication: No apparent difficulties   Cognition Arousal: Alert Behavior During Therapy: WFL for tasks assessed/performed Cognition: Cognition impaired, No family/caregiver present to determine baseline     Awareness: Intellectual awareness intact, Online awareness impaired Memory impairment (select all impairments): Working memory Attention impairment (select first level of impairment): Sustained attention Executive functioning impairment (select all impairments): Initiation, Organization, Problem solving OT - Cognition Comments: Pt oriented, 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  outside nose and decreased SpO2 down to low 80's, required increased SpO2 with activity    Pertinent Vitals/ Pain       Pain Assessment Pain Assessment: Faces Faces Pain Scale: Hurts a little bit Pain Location: bottoms of feet Pain Descriptors / Indicators: Grimacing, Discomfort Pain Intervention(s): Limited activity within patient's tolerance, Monitored during session, Repositioned  Home Living                                          Prior Functioning/Environment              Frequency  Min 1X/week        Progress Toward Goals  OT Goals(current goals can now be found in the care plan section)     Acute Rehab OT Goals OT Goal Formulation: With  patient Time For Goal Achievement: 10/13/23 Potential to Achieve Goals: Fair ADL Goals Pt Will Perform Grooming: with contact guard assist;with supervision;sitting Pt Will Perform Upper Body Bathing: with min assist;with contact guard assist;sitting Pt Will Perform Upper Body Dressing: with min assist;with contact guard assist;sitting Pt Will Transfer to Toilet: with max assist;with mod assist;with +2 assist;stand pivot transfer;bedside commode  Plan      Co-evaluation    PT/OT/SLP Co-Evaluation/Treatment: Yes Reason for Co-Treatment: For patient/therapist safety;To address functional/ADL transfers;Complexity of the patient's impairments (multi-system involvement);Necessary to address cognition/behavior during functional activity PT goals addressed during session: Mobility/safety with mobility;Balance;Proper use of DME;Strengthening/ROM OT goals addressed during session: ADL's and self-care;Proper use of Adaptive equipment and DME      AM-PAC OT "6 Clicks" Daily Activity     Outcome Measure   Help from another person eating meals?: None Help from another person taking care of personal grooming?: A Little Help from another person toileting, which includes using toliet, bedpan, or urinal?: Total Help from another person bathing (including washing, rinsing, drying)?: Total Help from another person to put on and taking off regular upper body clothing?:  A Lot Help from another person to put on and taking off regular lower body clothing?: Total 6 Click Score: 12    End of Session Equipment Utilized During Treatment: Gait belt;Oxygen;Other (comment) Antony Salmon)  OT Visit Diagnosis: Other abnormalities of gait and mobility (R26.89);Muscle weakness (generalized) (M62.81);Pain Pain - Right/Left:  (bilateral) Pain - part of body:  (LEs)   Activity Tolerance Patient limited by fatigue;Other (comment) (dizziness)   Patient Left in bed;with call bell/phone within reach   Nurse Communication  Mobility status        Time: 2440-1027 OT Time Calculation (min): 27 min  Charges: OT General Charges $OT Visit: 1 Visit OT Treatments $Self Care/Home Management : 8-22 mins Nyoka Cowden OTR/L Acute Rehabilitation Services Office: [phone]  Evern Bio [facility] 09/29/2023, 2:07 PM

## 2023-09-29 NOTE — Progress Notes (Signed)
 Physical Therapy Treatment  Patient Details Name: Jordan [name] MRN: [id number] DOB: 1951-03-03 Today's Date: 09/29/2023   History of Present Illness 73 yo male admitted with soft tissue infection/L LE wound, L toe laceration after sustaining a fall at ALF while be transferred back to bed per pt report. Hx of TAVR, bil LE edema, R TKA 07/2021, DM, Afib, anemia, anxiety, CHF, COPD-on O2 PRN per pt, obesity, aortic stenosis, venous insufficiency    PT Comments  Pt progressing slowly towards physical therapy goals.  Pt continues to require heavy +2 assist for bed mobility and basic transfers to/from EOB. Pt unable to tolerate any standing attempts today due to weakness, light headedness, and fatigue. We were not able to record orthostatics this date as pt was unsafe to leave sitting to get the necessary equipment to take vitals until pt was back in the bed. Will attempt to get a full set of orthostatics next session as able. Sitting balance varying from brief moments of CGA up to heavy mod assist. Noted GOC meeting on Monday.    If plan is discharge home, recommend the following: Assistance with cooking/housework;Assist for transportation;Help with stairs or ramp for entrance;Two people to help with walking and/or transfers;Two people to help with bathing/dressing/bathroom   Can travel by private vehicle     No  Equipment Recommendations  None recommended by PT    Recommendations for Other Services       Precautions / Restrictions Precautions Precautions: Fall;Other (comment) Precaution/Restrictions Comments: multiple LE wounds-tender to touch; chronic 2L O2 at baseline Restrictions Weight Bearing Restrictions Per Provider Order: No     Mobility  Bed Mobility Overal bed mobility: Needs Assistance Bed Mobility: Supine to Sit Rolling: Max assist, +2 for physical assistance, Used rails   Supine to sit: Max assist, HOB elevated, Used rails Sit to supine: Max assist, +2 for physical  assistance, HOB elevated   General bed mobility comments: Assist for all aspects of transitioning to/from EOB. Pt able to initiate LE movement, however required assist to complete. Heavy +2 assist for trunk elevation to full sitting position. Bed pad utilized for scooting to get feet on the floor and for scooting up in bed at end of session.    Transfers                   General transfer comment: Attempted to place [facility] however pt reporting lightheadedness and with increased difficulty holding upright posture. Opted for return to supine.    Ambulation/Gait                   Stairs             Wheelchair Mobility     Tilt Bed    Modified Rankin (Stroke Patients Only)       Balance Overall balance assessment: Needs assistance Sitting-balance support: Bilateral upper extremity supported, Feet supported Sitting balance-Leahy Scale: Fair Sitting balance - Comments: posterior lean in sitting, needs mod A to maintain Postural control: Posterior lean                                  Communication Communication Communication: No apparent difficulties  Cognition Arousal: Alert Behavior During Therapy: WFL for tasks assessed/performed   PT - Cognitive impairments: Orientation, Awareness, Problem solving   Orientation impairments: Situation  PT - Cognition Comments: Pt appeared to be confused at end of session, and was describing needing to get to a meeting/complete work. Following commands: Impaired Following commands impaired: Follows one step commands inconsistently, Follows one step commands with increased time    Cueing Cueing Techniques: Verbal cues, Tactile cues  Exercises      General Comments General comments (skin integrity, edema, etc.): Pt with [place] outside nose and decreased SpO2 down to low 80's, required increased SpO2 with activity      Pertinent Vitals/Pain Pain Assessment Pain Assessment:  Faces Faces Pain Scale: Hurts a little bit Pain Location: bottoms of feet Pain Descriptors / Indicators: Grimacing, Discomfort Pain Intervention(s): Limited activity within patient's tolerance, Monitored during session, Repositioned    Home Living                          Prior Function            PT Goals (current goals can now be found in the care plan section) Acute Rehab PT Goals Patient Stated Goal: return to ALF PT Goal Formulation: With patient Time For Goal Achievement: 10/06/23 Potential to Achieve Goals: Fair Progress towards PT goals: Progressing toward goals    Frequency    Min 2X/week      PT Plan      Co-evaluation PT/OT/SLP Co-Evaluation/Treatment: Yes Reason for Co-Treatment: For patient/therapist safety;To address functional/ADL transfers;Complexity of the patient's impairments (multi-system involvement);Necessary to address cognition/behavior during functional activity PT goals addressed during session: Mobility/safety with mobility;Balance;Proper use of DME;Strengthening/ROM OT goals addressed during session: ADL's and self-care;Proper use of Adaptive equipment and DME      AM-PAC PT "6 Clicks" Mobility   Outcome Measure  Help needed turning from your back to your side while in a flat bed without using bedrails?: A Lot Help needed moving from lying on your back to sitting on the side of a flat bed without using bedrails?: Total Help needed moving to and from a bed to a chair (including a wheelchair)?: Total Help needed standing up from a chair using your arms (e.g., wheelchair or bedside chair)?: Total Help needed to walk in hospital room?: Total Help needed climbing 3-5 steps with a railing? : Total 6 Click Score: 7    End of Session Equipment Utilized During Treatment: Oxygen Activity Tolerance: Patient limited by fatigue Patient left: in bed;with call bell/phone within reach;with bed alarm set Nurse Communication: Mobility  status PT Visit Diagnosis: Muscle weakness (generalized) (M62.81);History of falling (Z91.81);Other abnormalities of gait and mobility (R26.89) Pain - part of body: Leg     Time: 1005-1030 PT Time Calculation (min) (ACUTE ONLY): 25 min  Charges:    $Therapeutic Activity: 8-22 mins PT General Charges $$ ACUTE PT VISIT: 1 Visit                     Conni Slipper, PT, DPT Acute Rehabilitation Services Secure Chat Preferred Office: [phone]    Marylynn Pearson 09/29/2023, 3:20 PM

## 2023-09-29 NOTE — Progress Notes (Signed)
 PROGRESS NOTE  Jordan [name]:[id number] DOB: 30-Oct-1950 DOA: 09/01/2023 PCP: Kennith Center, NP   LOS: 28 days   Brief narrative:  Jordan [name] is an 73 y.o. male with past medical history of hypertension, heart failure with preserved ejection fraction, atrial fibrillation, CAD, COPD with chronic hypoxic respiratory failure on 2 L of oxygen was brought into the hospital from skilled nursing facility with worsening left leg wound.  Patient with history of chronic venous stasis.  Patient stated that he was doing well until he got a new wheelchair last week and the nurses aide tried to put him in bed but lost her balance and he scraped his leg and his skin came off.  In the ED, patient was noted to have increased erythema over the denuded part of the skin.  WBC was elevated at 14, creatinine at 2.4 and blood pressure was 104/60.  Heart rate was 115.  Patient was started on vancomycin and cefepime and was admitted hospital for further evaluation and treatment.    During hospitalization, patient had atrial fibrillation with RVR and cardiology followed the patient.  He has been seen by nephrology and has been initiated on hemodialysis pending renal improvement.  Urology also followed the patient due to hematuria.  Patient will need  skilled nursing facility placement  when determination on dialysis has been made.  Family meeting with palliative care and nephrology scheduled for Monday.  Assessment and plan.  Principal Problem:   Left leg cellulitis Active Problems:   Acute renal failure (ARF) (HCC)   Gross hematuria   Hypotension   Pleural effusion on right   Essential hypertension   HLD (hyperlipidemia)   Chronic venous stasis dermatitis of both lower extremities   COPD (chronic obstructive pulmonary disease) (HCC)   S/P TAVR (transcatheter aortic valve replacement)   Chronic heart failure with preserved ejection fraction (HCC)   Persistent atrial fibrillation (HCC)   Chronic  respiratory failure with hypoxia (HCC)   Hyponatremia  * Atrial fibrillation with RVR, history of persistent atrial fibrillation  Patient was was on atenolol prior to discharge.  Cardiology following.  Continue metoprolol and midodrine if needed.   Eliquis on hold due to hematuria.  Rate controlled at this time.   Acute renal failure -Creatinine at 4.2.  Seen by nephrology and patient has been started on hemodialysis during hospitalization.   Nephrology following the patient.,  Nephrology is considering that patient is a poor dialysis candidate in the long-term and goals of care discussion is undergoing.  Plan for family meeting with palliative care this Monday.   Hyponatremia.  On hemodialysis.  Latest sodium of 130.  Mild hypokalemia.  Improved after oral potassium.  Potassium creatinine 3.8.  Left leg cellulitis Improving.  Status post 10-day Keflex and 5-day course of doxycycline.  Patient also was seen by vascular surgery and underwent arteriogram on 325/25 with no significant peripheral vascular disease.  Patient will need to follow-up with  Dr Lajoyce Corners as outpatient.  Hematuria Ongoing hematuria but light.  Hemoglobin today at 6.9.  Hemoglobin yesterday at 7.5.  Seen by urology and Eliquis on hold.  CT cystogram of the abdomen and pelvis due to hematuria was done which showed 3.8 x 1.5 x 2.7 cm focal disproportionate wall thickening along the superior wall of the bladder with trabeculation and small diverticuli.  Urology following.  Will transfuse 1 unit of packed RBC today.  Will need to follow-up with [facility].  Acute blood loss anemia secondary to  hematuria.  Received 1 unit of packed RBC during hospitalization.  Hemoglobin today at 6.9 so we will transfuse 1 additional unit of PRBC today.  Pleural effusion on right On 3 L of oxygen.  Continue hemodialysis for fluid management.   Hypotension Blood pressure today at 101/67.  Chronic respiratory failure with hypoxia    Currently on 3 L oxygen.    Chronic heart failure with preserved ejection fraction Patient had large right pleural effusion on CXR.  Continue volume management as per hemodialysis.  On 3 L of oxygen by nasal cannula.  S/P TAVR (transcatheter aortic valve replacement) Stable.   COPD (chronic obstructive pulmonary disease) Continue nebulizers as needed.    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 Continue with wound care.  Pressure injury left heel unstageable.  Present on admission.  Continue offloading and wound care if necessary. Pressure Injury 09/02/23 Heel Left Unstageable - Full thickness tissue loss in which the base of the injury is covered by slough (yellow, tan, gray, green or brown) and/or eschar (tan, brown or black) in the wound bed. (Active)  09/02/23 1713  Location: Heel  Location Orientation: Left  Staging: Unstageable - Full thickness tissue loss in which the base of the injury is covered by slough (yellow, tan, gray, green or brown) and/or eschar (tan, brown or black) in the wound bed.  Wound Description (Comments):   Present on Admission: Yes     Hyperlipidemia Chronic. On lipitor 80 mg every other day.   Essential hypertension Holding antihypertensives for now, blood pressure marginally low.  On Lasix  Debility, deconditioning.  Has been bed bound for 6 months.  Patient is from assisted living.  Will need skilled nursing facility likely on discharge.  DVT prophylaxis: Place and maintain sequential compression device Start: 09/19/23 1109   Disposition: Likely to skilled nursing facility when clinically improved ,  Palliative care discussion underway regarding overall goals of care.  Status is: Inpatient  Remains inpatient appropriate because: pending clinical improvement,  new hemodialysis, need for skilled nursing facility placement.,  Determination of dialysis needs/ goals of care discussion.   Code Status:     Code Status:  Limited: Do not attempt resuscitation (DNR) -DNR-LIMITED -Do Not Intubate/DNI   Family Communication:   I spoke with the patient's  cousin and aunt on the phone on 09/29/2023 and updated them about the clinical condition of the patient.  Consultants: Vascular surgery Nephrology urology  Cardiology Palliative care  Procedures:  Abdominal Aortogram on 09-12-2023 Foley catheter placement Right internal jugular triple-lumen catheter on 09/20/2023 by interventional radiology Hemodialysis PRBC transfusion.  Anti-infectives:  Rocephin IV-completed  Subjective:  Today, patient was seen and examined at bedside.  Patient states that the shortness of breath is okay he denies any pain nausea vomiting fever chills.  Has fatigue weakness. Objective: Vitals:   09/29/23 0829 09/29/23 0933  BP: (!) 149/75 101/67  Pulse: 89 (!) 106  Resp: 19 (!) 22  Temp: 98.1 F (36.7 C) 98.3 F (36.8 C)  SpO2: 99% 100%    Intake/Output Summary (Last 24 hours) at 09/29/2023 1150 Last data filed at 09/29/2023 0931 Gross per 24 hour  Intake 243 ml  Output 925 ml  Net -682 ml   Filed Weights   09/26/23 0815 09/26/23 1224 09/28/23 1230  Weight: 102 kg 100.1 kg 100.1 kg   Body mass index is 29.12 kg/m.   Physical Exam:  GENERAL: Patient is alert awake and Communicative.  Obese built.  Appears weak  and deconditioned, on nasal cannula oxygen  HENT: No scleral pallor or icterus. Pupils equally reactive to light. Oral mucosa is moist NECK: is supple, no gross swelling noted.  Right internal jugular hemodialysis catheter in place. CHEST:   Diminished breath sounds bilaterally. CVS: S1 and S2 heard, no murmur.  Rate controlled at this time ABDOMEN: Soft, non-tender, bowel sounds are present.  Foley catheter with slightly blood tinged urine. EXTREMITIES bilateral lower extremity edema, mild erythema CNS: Cranial nerves are intact.  Generalized weakness noted.  Moves all extremities. SKIN: warm and dry,  left heel pressure ulceration present on admission  Data Review: I have personally reviewed the following laboratory data and studies,  CBC: Recent Labs  Lab 09/25/23 0258 09/26/23 0227 09/27/23 0304 09/28/23 0357 09/29/23 0338  WBC 8.3 7.8 7.9 8.9 8.3  HGB 8.0* 7.5* 7.1* 7.5* 6.9*  HCT 27.3* 25.4* 24.3* 25.9* 23.5*  MCV 83.5 83.3 83.8 85.2 85.5  PLT 262 278 253 272 229   Basic Metabolic Panel: Recent Labs  Lab 09/24/23 0339 09/25/23 0258 09/26/23 0227 09/27/23 0304 09/28/23 0357 09/29/23 0338  NA 130*  130* 130* 131* 132*  132* 132*  131* 130*  K 3.5  3.5 3.7 3.7 3.4*  3.4* 4.3  4.2 3.8  CL 95*  94* 94* 95* 97*  96* 96*  95* 96*  CO2 25  25 28 26 27  27 26  25 26   GLUCOSE 102*  101* 87 94 105*  104* 94  95 92  BUN 30*  30* 43* 53* 34*  34* 46*  45* 32*  CREATININE 3.91*  3.91* 5.31* 6.17* 4.37*  4.37* 5.57*  5.45* 4.20*  CALCIUM 8.1*  8.1* 8.1* 8.4* 8.0*  8.0* 8.3*  8.3* 8.1*  MG 1.9 2.1  --  2.0 2.1 1.9  PHOS 3.3 4.1 4.3 3.3 4.7* 3.1   Liver Function Tests: Recent Labs  Lab 09/25/23 0258 09/26/23 0227 09/27/23 0304 09/28/23 0357 09/29/23 0338  ALBUMIN 2.0* 2.0* 2.2* 2.2* 2.3*   No results for input(s): "LIPASE", "AMYLASE" in the last 168 hours. No results for input(s): "AMMONIA" in the last 168 hours. Cardiac Enzymes: No results for input(s): "CKTOTAL", "CKMB", "CKMBINDEX", "TROPONINI" in the last 168 hours. BNP (last 3 results) No results for input(s): "BNP" in the last 8760 hours.  ProBNP (last 3 results) No results for input(s): "PROBNP" in the last 8760 hours.  CBG: Recent Labs  Lab 09/25/23 2033 09/27/23 0753  GLUCAP 101* 91   No results found for this or any previous visit (from the past 240 hours).   Studies: No results found.      Joycelyn Das, MD  [facility] 09/29/2023  If 7PM-7AM, please contact night-coverage

## 2023-09-30 DIAGNOSIS — R310 Gross hematuria: Secondary | ICD-10-CM | POA: Diagnosis not present

## 2023-09-30 DIAGNOSIS — I872 Venous insufficiency (chronic) (peripheral): Secondary | ICD-10-CM | POA: Diagnosis not present

## 2023-09-30 DIAGNOSIS — L03116 Cellulitis of left lower limb: Secondary | ICD-10-CM | POA: Diagnosis not present

## 2023-09-30 DIAGNOSIS — N179 Acute kidney failure, unspecified: Secondary | ICD-10-CM | POA: Diagnosis not present

## 2023-09-30 LAB — TYPE AND SCREEN
ABO/RH(D): O POS
Antibody Screen: NEGATIVE
Unit division: 0

## 2023-09-30 LAB — RENAL FUNCTION PANEL
Albumin: 2.5 g/dL — ABNORMAL LOW (ref 3.5–5.0)
Anion gap: 9 (ref 5–15)
BUN: 42 mg/dL — ABNORMAL HIGH (ref 8–23)
CO2: 26 mmol/L (ref 22–32)
Calcium: 8.4 mg/dL — ABNORMAL LOW (ref 8.9–10.3)
Chloride: 97 mmol/L — ABNORMAL LOW (ref 98–111)
Creatinine, Ser: 5.1 mg/dL — ABNORMAL HIGH (ref 0.61–1.24)
GFR, Estimated: 11 mL/min — ABNORMAL LOW (ref >60)
Glucose, Bld: 88 mg/dL (ref 70–99)
Phosphorus: 4.5 mg/dL (ref 2.5–4.6)
Potassium: 3.9 mmol/L (ref 3.5–5.1)
Sodium: 132 mmol/L — ABNORMAL LOW (ref 135–145)

## 2023-09-30 LAB — CBC
HCT: 28.5 % — ABNORMAL LOW (ref 39.0–52.0)
Hemoglobin: 8.3 g/dL — ABNORMAL LOW (ref 13.0–17.0)
MCH: 24.6 pg — ABNORMAL LOW (ref 26.0–34.0)
MCHC: 29.1 g/dL — ABNORMAL LOW (ref 30.0–36.0)
MCV: 84.6 fL (ref 80.0–100.0)
Platelets: 266 10*3/uL (ref 150–400)
RBC: 3.37 MIL/uL — ABNORMAL LOW (ref 4.22–5.81)
RDW: 23.4 % — ABNORMAL HIGH (ref 11.5–15.5)
WBC: 9.9 10*3/uL (ref 4.0–10.5)
nRBC: 0 % (ref 0.0–0.2)

## 2023-09-30 LAB — BPAM RBC
Blood Product Expiration Date: 202504172359
ISSUE DATE / TIME: 202504110905
Unit Type and Rh: 5100

## 2023-09-30 LAB — MAGNESIUM: Magnesium: 2 mg/dL (ref 1.7–2.4)

## 2023-09-30 MED ORDER — DARBEPOETIN ALFA 150 MCG/0.3ML IJ SOSY
150.0000 ug | PREFILLED_SYRINGE | INTRAMUSCULAR | Status: DC
  Administered 2023-09-30: 150 ug via SUBCUTANEOUS
  Filled 2023-09-30: qty 0.3

## 2023-09-30 NOTE — Progress Notes (Signed)
 Progress Note   Patient: Jordan [name] [name]:[id number] DOB: [birth year] DOA: 09/01/2023     29 DOS: the patient was seen and examined on 09/30/2023   Brief hospital course:  73 y.o. male with past medical history of hypertension, heart failure with preserved ejection fraction, atrial fibrillation, CAD, COPD with chronic hypoxic respiratory failure on 2 L of oxygen was brought into the hospital from skilled nursing facility with worsening left leg wound.  Patient with history of chronic venous stasis.  Patient stated that he was doing well until he got a new wheelchair last week and the nurses aide tried to put him in bed but lost her balance and he scraped his leg and his skin came off.  In the ED, patient was noted to have increased erythema over the denuded part of the skin.  WBC was elevated at 14, creatinine at 2.4 and blood pressure was 104/60.  Heart rate was 115.  Patient was started on vancomycin and cefepime and was admitted hospital for further evaluation and treatment.     During hospitalization, patient had atrial fibrillation with RVR and cardiology followed the patient.  He has been seen by nephrology and has been initiated on hemodialysis pending renal improvement.  Urology also followed the patient due to hematuria.  Patient will need  skilled nursing facility placement  when determination on dialysis has been made.  Family meeting with palliative care and nephrology scheduled for Monday.  Assessment and Plan:  Persistent atrial fibrillation with RVR - Cardiology following closely.  Continue metoprolol.  Eliquis on hold secondary to hematuria.  Appears rate controlled at this time.  Acute kidney injury - Previous baseline 1.3 last year.  Nephrology following closely.  Suspect cardiorenal etiology.  Renal function worsened after renal angiogram.  Temporary dialysis catheter placed 4/2 and subsequently initiated on intermittent HD.  Received IHD 4/2, 4/3, 4/5, 4/8, 4/10.  Urine output  improving, possibility of kidney function recovering.  Will continue to monitor urine output.  Hematuria - Evaluated by urology.  Will have repeat cystoscopy performed in outpatient setting.  Hematuria appears to be improving.  BL LE cellulitis/dermatitis secondary to chronic venous stasis - Continues to improve.  Completed 10-day course of Keflex and 5-day course of doxycycline.  Patient to follow with Dr. Etta Heritage in the outpatient setting.  Wound care on board  Acute blood loss anemia - Secondary to hematuria.  Status post 1un pRBC 3/24, 4/5, 4/11.  Will recheck CBC in AM.  Acute on chronic hypoxic respiratory failure - Continues to improve, now down to 2 L nasal cannula (patient's baseline).  Acute on chronic HFpEF with right pleural effusion - Volume status improved after IHD.  Pressure injury left heel unstageable - Noted on presentation.  Continue wound care.  Functional quadriplegia - Patient has been bedbound for greater than 6 months.  Originally from ALF.  Will likely need skilled facility on discharge.  Goals of care - Duration of dialysis questionable at this time.  If patient needs ongoing dialysis, will need tunneled dialysis catheter prior to discharge.  Will also work closely with case management on disposition planning given he will likely need skilled nursing facility.     Subjective: Patient feeling well this morning.  Denies any worsening shortness of breath, fever, chills, chest pain, nausea, vomiting, abdominal pain.  Physical Exam: Vitals:   09/30/23 0512 09/30/23 0712 09/30/23 0830 09/30/23 0918  BP: 107/62   102/63  Pulse: 94   (!) 114  Resp: 18  Temp: 98.1 F (36.7 C)     TempSrc: Oral     SpO2: 99%  99% 96%  Weight:  98 kg    Height:       GENERAL:  Alert, pleasant, no acute distress, disheveled HEENT:  EOMI, nasal cannula CARDIOVASCULAR:  RRR, no murmurs appreciated RESPIRATORY:  Clear to auscultation, no wheezing, rales, or  rhonchi GASTROINTESTINAL:  Soft, nontender, nondistended EXTREMITIES: BL LE pitting edema, chronic NEURO:  No new focal deficits appreciated SKIN: Lipodermatosclerosis BL LE PSYCH:  Appropriate mood and affect   Data Reviewed:  There are no new results to review at this time.  Family Communication: None at bedside  Disposition: Status is: Inpatient Remains inpatient appropriate because: IHD  Planned Discharge Destination: Skilled nursing facility    Time spent: 38 minutes  Author: Jodeane Mulligan, DO 09/30/2023 2:49 PM  For on call review [email or web address].

## 2023-09-30 NOTE — Progress Notes (Signed)
 [facility] Progress Note   Assessment/ Plan:   AKI: b/l creat is 1.3, from Jan 2024. Assuming this is AKI but could also have CKD. UA shows some rbc's/ wbc's, no proteinuria. Renal US  w/o obstruction. BP's were soft in 90s on presentation and then improved after IV abx and IVF's, but pt developed pulm edema. Suspect AKI due to CHF/ cardiorenal. Started IV lasix 3/19 -  Diuresed well x several days then BP dropped 3/23 UOP dropped. A renal CO2 angiogram was negative for renal artery stenosis.unfortunately the Scr rose up to 3 then 4 then 6.  Repeat renal US  showed no hydro, exam and CXR showed persistent fluid overload. IR placed temp cath 4/02. Pt was started on iHD 4/2, and got his 2nd HD 4/3 and 3rd session 4/5             -  HD  4/8 and 09/28/23             - he is wheelchair/ bedbound at baseline- he is a very poor long-term dialysis candidate             - appreciate GOC--> discussed with Palliative and with pt's cousin, Geralyn Knee.  We discussed GOC.  Ongoing talks, for now continue HD  - has a nontunneled HD cath in place for 10 days- if long-term dialysis is desired / needed would need  TDC- possibly next week  For now continue to follow daily for HD need-  possibly with increased UOP-  may be signaling recovery -  hold HD today and follow   Volume - diffuse LE edema and +CHF on all CXR's. Volume overload improving on exam, UOP picking up-  added some lasix BP - bp's soft, metoprolol was stopped. Tolerating UF w/ HD.  Atrial fib - metoprolol dc'd due to soft BP's. HR's 100-120s, cardiology consulting.  Gross hematuria: appreciate urology assistance.  Anemia - chronic issue. 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 1000 of UOP-  BUN and crt up some this AM   Objective:    BP 102/63 (BP Location: Left Arm)   Pulse (!) 114   Temp 98.1 F (36.7 C) (Oral)   Resp 18   Ht 6\' 1"  (1.854 m)   Wt 98 kg   SpO2 96%   BMI 28.50 kg/m   Physical Exam: Gen: NAD, [place] O2-  happy -  still confused-  said wants to call his mother  CVS: tachy Resp: CTA Abd: +BS, soft, NT/ND Ext: still has 1+ bilat hip edema > pretib edema, also UE edema ACCESS: R nontunneled HD cath  blood tinged urine in foley   Labs: BMET Recent Labs  Lab 09/24/23 0339 09/25/23 0258 09/26/23 0227 09/27/23 0304 09/28/23 0357 09/29/23 0338 09/30/23 0333  NA 130*  130* 130* 131* 132*  132* 132*  131* 130* 132*  K 3.5  3.5 3.7 3.7 3.4*  3.4* 4.3  4.2 3.8 3.9  CL 95*  94* 94* 95* 97*  96* 96*  95* 96* 97*  CO2 25  25 28 26 27  27 26  25 26 26   GLUCOSE 102*  101* 87 94 105*  104* 94  95 92 88  BUN 30*  30* 43* 53*  34*  34* 46*  45* 32* 42*  CREATININE 3.91*  3.91* 5.31* 6.17* 4.37*  4.37* 5.57*  5.45* 4.20* 5.10*  CALCIUM 8.1*  8.1* 8.1* 8.4* 8.0*  8.0* 8.3*  8.3* 8.1* 8.4*  PHOS 3.3 4.1 4.3 3.3 4.7* 3.1 4.5   CBC Recent Labs  Lab 09/28/23 0357 09/29/23 0338 09/29/23 1648 09/30/23 0333  WBC 8.9 8.3 9.6 9.9  HGB 7.5* 6.9* 8.5* 8.3*  HCT 25.9* 23.5* 28.4* 28.5*  MCV 85.2 85.5 83.5 84.6  PLT 272 229 265 266      Medications:     sodium chloride   Intravenous Once   atorvastatin  80 mg Oral QODAY   Chlorhexidine Gluconate Cloth  6 each Topical Q0600   feeding supplement (NEPRO CARB STEADY)  237 mL Oral BID BM   furosemide  40 mg Oral BID   heparin sodium (porcine)  2,800 Units Intravenous Once   LORazepam  1 mg Oral BID   metoprolol tartrate  25 mg Oral BID   mometasone-formoterol  2 puff Inhalation BID   multivitamin with minerals  1 tablet Oral Q breakfast   pantoprazole  40 mg Oral Daily   sevelamer carbonate  800 mg Oral TID WC   sodium chloride flush  3 mL Intravenous Q12H   venlafaxine XR  75 mg Oral Q breakfast     Yaremi Stahlman A  Reilley Valentine  09/30/2023, 11:14 AM

## 2023-10-01 DIAGNOSIS — I5032 Chronic diastolic (congestive) heart failure: Secondary | ICD-10-CM | POA: Diagnosis not present

## 2023-10-01 DIAGNOSIS — R310 Gross hematuria: Secondary | ICD-10-CM | POA: Diagnosis not present

## 2023-10-01 DIAGNOSIS — L03116 Cellulitis of left lower limb: Secondary | ICD-10-CM | POA: Diagnosis not present

## 2023-10-01 DIAGNOSIS — N179 Acute kidney failure, unspecified: Secondary | ICD-10-CM | POA: Diagnosis not present

## 2023-10-01 LAB — RENAL FUNCTION PANEL
Albumin: 2.3 g/dL — ABNORMAL LOW (ref 3.5–5.0)
Anion gap: 9 (ref 5–15)
BUN: 54 mg/dL — ABNORMAL HIGH (ref 8–23)
CO2: 27 mmol/L (ref 22–32)
Calcium: 8.4 mg/dL — ABNORMAL LOW (ref 8.9–10.3)
Chloride: 95 mmol/L — ABNORMAL LOW (ref 98–111)
Creatinine, Ser: 5.78 mg/dL — ABNORMAL HIGH (ref 0.61–1.24)
GFR, Estimated: 10 mL/min — ABNORMAL LOW (ref >60)
Glucose, Bld: 102 mg/dL — ABNORMAL HIGH (ref 70–99)
Phosphorus: 4.3 mg/dL (ref 2.5–4.6)
Potassium: 3.7 mmol/L (ref 3.5–5.1)
Sodium: 131 mmol/L — ABNORMAL LOW (ref 135–145)

## 2023-10-01 LAB — CBC
HCT: 25.7 % — ABNORMAL LOW (ref 39.0–52.0)
Hemoglobin: 7.8 g/dL — ABNORMAL LOW (ref 13.0–17.0)
MCH: 25.6 pg — ABNORMAL LOW (ref 26.0–34.0)
MCHC: 30.4 g/dL (ref 30.0–36.0)
MCV: 84.3 fL (ref 80.0–100.0)
Platelets: 248 10*3/uL (ref 150–400)
RBC: 3.05 MIL/uL — ABNORMAL LOW (ref 4.22–5.81)
RDW: 23.2 % — ABNORMAL HIGH (ref 11.5–15.5)
WBC: 9.6 10*3/uL (ref 4.0–10.5)
nRBC: 0 % (ref 0.0–0.2)

## 2023-10-01 MED ORDER — CHLORHEXIDINE GLUCONATE CLOTH 2 % EX PADS
6.0000 | MEDICATED_PAD | Freq: Every day | CUTANEOUS | Status: DC
Start: 2023-10-02 — End: 2023-10-02
  Administered 2023-10-02: 6 via TOPICAL

## 2023-10-01 NOTE — Plan of Care (Signed)
  Problem: Health Behavior/Discharge Planning: Goal: Ability to manage health-related needs will improve Outcome: Progressing   Problem: Clinical Measurements: Goal: Ability to maintain clinical measurements within normal limits will improve Outcome: Progressing Goal: Will remain free from infection Outcome: Progressing Goal: Diagnostic test results will improve Outcome: Progressing Goal: Respiratory complications will improve Outcome: Progressing Goal: Cardiovascular complication will be avoided Outcome: Progressing   Problem: Activity: Goal: Risk for activity intolerance will decrease Outcome: Progressing   Problem: Nutrition: Goal: Adequate nutrition will be maintained Outcome: Progressing   Problem: Coping: Goal: Level of anxiety will decrease Outcome: Progressing   Problem: Elimination: Goal: Will not experience complications related to bowel motility Outcome: Progressing Goal: Will not experience complications related to urinary retention Outcome: Progressing   Problem: Pain Managment: Goal: General experience of comfort will improve and/or be controlled Outcome: Progressing   Problem: Safety: Goal: Ability to remain free from injury will improve Outcome: Progressing   Problem: Skin Integrity: Goal: Risk for impaired skin integrity will decrease Outcome: Progressing   Problem: Clinical Measurements: Goal: Ability to avoid or minimize complications of infection will improve Outcome: Progressing   Problem: Skin Integrity: Goal: Skin integrity will improve Outcome: Progressing   Problem: Education: Goal: Understanding of CV disease, CV risk reduction, and recovery process will improve Outcome: Progressing Goal: Individualized Educational Video(s) Outcome: Progressing   Problem: Activity: Goal: Ability to return to baseline activity level will improve Outcome: Progressing   Problem: Cardiovascular: Goal: Ability to achieve and maintain adequate  cardiovascular perfusion will improve Outcome: Progressing Goal: Vascular access site(s) Level 0-1 will be maintained Outcome: Progressing   Problem: Health Behavior/Discharge Planning: Goal: Ability to safely manage health-related needs after discharge will improve Outcome: Progressing

## 2023-10-01 NOTE — Progress Notes (Signed)
 Progress Note   Patient: Jordan [name] HYQ:[id number] DOB: Oct 18, 1950 DOA: 09/01/2023     30 DOS: the patient was seen and examined on 10/01/2023   Brief hospital course:  73 y.o. male with past medical history of hypertension, heart failure with preserved ejection fraction, atrial fibrillation, CAD, COPD with chronic hypoxic respiratory failure on 2 L of oxygen was brought into the hospital from skilled nursing facility with worsening left leg wound.  Patient with history of chronic venous stasis.  Patient stated that he was doing well until he got a new wheelchair last week and the nurses aide tried to put him in bed but lost her balance and he scraped his leg and his skin came off.  In the ED, patient was noted to have increased erythema over the denuded part of the skin.  WBC was elevated at 14, creatinine at 2.4 and blood pressure was 104/60.  Heart rate was 115.  Patient was started on vancomycin and cefepime and was admitted hospital for further evaluation and treatment.     During hospitalization, patient had atrial fibrillation with RVR and cardiology followed the patient.  He has been seen by nephrology and has been initiated on hemodialysis pending renal improvement.  Urology also followed the patient due to hematuria.  Patient will need  skilled nursing facility placement  when determination on dialysis has been made.  Family meeting with palliative care and nephrology scheduled for Monday.  Assessment and Plan:  Persistent atrial fibrillation with RVR - Cardiology following closely.  Continue metoprolol.  Eliquis on hold secondary to hematuria.  Appears rate controlled at this time.  Acute kidney injury - Previous baseline 1.3 last year.  Nephrology following closely.  Suspect cardiorenal etiology.  Renal function worsened after renal angiogram.  Temporary dialysis catheter placed 4/2 and subsequently initiated on intermittent HD.  Received IHD 4/2, 4/3, 4/5, 4/8, 4/10.  Urine output  improving, possibility of kidney function recovering.  However creatinine worse this morning.  Will likely need repeat hemodialysis tomorrow.  Ongoing discussions about whether patient will need to pursue tunneled dialysis catheter for ongoing outpatient HD.  Will continue to monitor urine output.  Hematuria - Evaluated by urology.  Will have repeat cystoscopy performed in outpatient setting.  Hematuria appears to be improving.  BL LE cellulitis/dermatitis secondary to chronic venous stasis - Continues to improve.  Completed 10-day course of Keflex and 5-day course of doxycycline.  Patient to follow with Dr. Etta Heritage in the outpatient setting.  Wound care on board  Acute blood loss anemia - Secondary to hematuria.  Status post 1un pRBC 3/24, 4/5, 4/11.  Showing slow downtrend.  Will recheck CBC in AM.  Acute on chronic hypoxic respiratory failure - Continues to improve, now down to 2 L nasal cannula (patient's baseline).  Acute on chronic HFpEF with right pleural effusion - Volume status improved after iHD.  Pressure injury left heel unstageable - Noted on presentation.  Continue wound care.  Functional quadriplegia - Patient has been bedbound for greater than 6 months.  Originally from ALF.  Will likely need skilled facility on discharge.  Goals of care - Duration of dialysis questionable at this time.  If patient needs ongoing dialysis, will need tunneled dialysis catheter prior to discharge.  Will also work closely with case management on disposition planning given he will likely need skilled nursing facility.     Subjective: Patient feeling well this morning.  Denies any worsening shortness of breath, fever, chills, chest pain, nausea,  vomiting, abdominal pain.  Was able to finally have a bowel movement after being constipated for multiple days.  Physical Exam: Vitals:   09/30/23 2056 10/01/23 0446 10/01/23 0821 10/01/23 0848  BP:  116/76  96/67  Pulse:  98  100  Resp:  18     Temp:  97.7 F (36.5 C)    TempSrc:  Oral    SpO2: 97% 97% 97% 95%  Weight:  99.5 kg    Height:       GENERAL:  Alert, pleasant, no acute distress, disheveled HEENT:  EOMI, nasal cannula CARDIOVASCULAR:  RRR, no murmurs appreciated RESPIRATORY:  Clear to auscultation, no wheezing, rales, or rhonchi GASTROINTESTINAL:  Soft, nontender, nondistended EXTREMITIES: BL LE pitting edema, chronic NEURO:  No new focal deficits appreciated SKIN: Lipodermatosclerosis BL LE PSYCH:  Appropriate mood and affect   Data Reviewed:  There are no new results to review at this time.  Family Communication: None at bedside  Disposition: Status is: Inpatient Remains inpatient appropriate because: IHD  Planned Discharge Destination: Skilled nursing facility    Time spent: 36 minutes  Author: Jodeane Mulligan, DO 10/01/2023 2:06 PM  For on call review [email or web address].

## 2023-10-01 NOTE — Progress Notes (Signed)
 Jordan [name] Progress Note   Assessment/ Plan:   AKI: b/l creat is 1.3, from Jan 2024. Assuming this is AKI but could also be CKD. UA shows some rbc's/ wbc's, no proteinuria. Renal US w/o obstruction. BP's were soft in 90s on presentation and then improved after IV abx and IVF's, but pt developed pulm edema. Suspect AKI due to CHF/ cardiorenal. Started IV lasix 3/19 -  Diuresed well x several days then BP dropped 3/23 UOP dropped. A renal CO2 angiogram was negative for renal artery stenosis.unfortunately the Scr cont to rise.  Repeat renal US showed no hydro, exam and CXR showed persistent fluid overload. IR placed temp cath 4/02. Pt was started on iHD 4/2, and got his 2nd HD 4/3 and 3rd session 4/5             -  HD  4/8 and 09/28/23             - he is wheelchair/ bedbound at baseline and it appears he has dementia- he is a very poor long-term dialysis candidate             - appreciate GOC--> discussed with Palliative and with pt's cousin, Jordan [name].  We discussed GOC.  Ongoing talks, for now continue HD  - has a nontunneled HD cath in place for 10 days- if long-term dialysis is desired / needed would need  TDC- possibly next week  For now continue to follow daily for HD need-  possibly with increased UOP-but no clearance-  will need HD tomorrow-   then need to resume talks about what is best for him  Volume - diffuse LE edema and +CHF on all CXR's. Volume overload improving on exam, UOP picking up-  added some lasix but UOP dropped off again  BP - bp's soft, metoprolol was stopped. Tolerating UF w/ HD.  Atrial fib - metoprolol dc'd due to soft BP's. HR's 100-120s, cardiology consulting- is back on   Gross hematuria: appreciate urology assistance.  Anemia - chronic issue. 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 Disposition lives at [facility] ( asst living) -  I think if  decision is made to continue HD he would need SNF   Subjective:    Still confused this AM but very pleasant-  is needing help with his lunch-  not sure he truly understands what is happening here-  only 600 of urine-  BUN and crt rising since last HD    Objective:   BP 96/67 (BP Location: Left Arm)   Pulse 100   Temp 97.7 F (36.5 C) (Oral)   Resp 18   Ht 6\' 1"  (1.854 m)   Wt 99.5 kg   SpO2 95%   BMI 28.94 kg/m   Physical Exam: Gen: NAD, Jordan [name] O2-  happy -  still confused-  said wants to call his mother  CVS: tachy Resp: CTA Abd: +BS, soft, NT/ND Ext: still has 1+ bilat hip edema > pretib edema, also UE edema ACCESS: R nontunneled HD cath  blood tinged urine in foley   Labs: BMET Recent Labs  Lab 09/25/23 0258 09/26/23 0227 09/27/23 0304 09/28/23 0357 09/29/23 0338 09/30/23 0333 10/01/23 0344  NA 130* 131* 132*  132* 132*  131* 130* 132* 131*  K 3.7 3.7 3.4*  3.4* 4.3  4.2 3.8 3.9 3.7  CL  94* 95* 97*  96* 96*  95* 96* 97* 95*  CO2 28 26 27  27 26  25 26 26 27   GLUCOSE 87 94 105*  104* 94  95 92 88 102*  BUN 43* 53* 34*  34* 46*  45* 32* 42* 54*  CREATININE 5.31* 6.17* 4.37*  4.37* 5.57*  5.45* 4.20* 5.10* 5.78*  CALCIUM 8.1* 8.4* 8.0*  8.0* 8.3*  8.3* 8.1* 8.4* 8.4*  PHOS 4.1 4.3 3.3 4.7* 3.1 4.5 4.3   CBC Recent Labs  Lab 09/29/23 0338 09/29/23 1648 09/30/23 0333 10/01/23 0344  WBC 8.3 9.6 9.9 9.6  HGB 6.9* 8.5* 8.3* 7.8*  HCT 23.5* 28.4* 28.5* 25.7*  MCV 85.5 83.5 84.6 84.3  PLT 229 265 266 248      Medications:     sodium chloride   Intravenous Once   atorvastatin  80 mg Oral QODAY   Chlorhexidine Gluconate Cloth  6 each Topical Q0600   darbepoetin (ARANESP) injection - DIALYSIS  150 mcg Subcutaneous Q Sat-1800   feeding supplement (NEPRO CARB STEADY)  237 mL Oral BID BM   furosemide  40 mg Oral BID   heparin sodium (porcine)  2,800 Units Intravenous Once   LORazepam  1 mg Oral BID   metoprolol tartrate  25 mg Oral BID    mometasone-formoterol  2 puff Inhalation BID   multivitamin with minerals  1 tablet Oral Q breakfast   pantoprazole  40 mg Oral Daily   sevelamer carbonate  800 mg Oral TID WC   sodium chloride flush  3 mL Intravenous Q12H   venlafaxine XR  75 mg Oral Q breakfast     Jordan [name] A Jordan [name]  10/01/2023, 12:00 PM

## 2023-10-02 DIAGNOSIS — L03116 Cellulitis of left lower limb: Secondary | ICD-10-CM | POA: Diagnosis not present

## 2023-10-02 LAB — RENAL FUNCTION PANEL
Albumin: 2.3 g/dL — ABNORMAL LOW (ref 3.5–5.0)
Anion gap: 8 (ref 5–15)
BUN: 58 mg/dL — ABNORMAL HIGH (ref 8–23)
CO2: 27 mmol/L (ref 22–32)
Calcium: 8.4 mg/dL — ABNORMAL LOW (ref 8.9–10.3)
Chloride: 98 mmol/L (ref 98–111)
Creatinine, Ser: 6.1 mg/dL — ABNORMAL HIGH (ref 0.61–1.24)
GFR, Estimated: 9 mL/min — ABNORMAL LOW (ref >60)
Glucose, Bld: 95 mg/dL (ref 70–99)
Phosphorus: 4.2 mg/dL (ref 2.5–4.6)
Potassium: 3.6 mmol/L (ref 3.5–5.1)
Sodium: 133 mmol/L — ABNORMAL LOW (ref 135–145)

## 2023-10-02 MED ORDER — BIOTENE DRY MOUTH MT LIQD: 15.0000 mL | OROMUCOSAL | Status: DC | PRN

## 2023-10-02 MED ORDER — HYDROMORPHONE HCL 1 MG/ML IJ SOLN
0.5000 mg | Freq: Three times a day (TID) | INTRAMUSCULAR | Status: DC
  Administered 2023-10-02 – 2023-10-03 (×3): 0.5 mg via INTRAVENOUS
  Filled 2023-10-02 (×5): qty 0.5

## 2023-10-02 MED ORDER — LORAZEPAM 2 MG/ML IJ SOLN: 0.5000 mg | INTRAMUSCULAR | Status: DC | PRN

## 2023-10-02 MED ORDER — ONDANSETRON HCL 4 MG/2ML IJ SOLN
4.0000 mg | Freq: Four times a day (QID) | INTRAMUSCULAR | Status: DC | PRN
  Administered 2023-10-03: 4 mg via INTRAVENOUS
  Filled 2023-10-02: qty 2

## 2023-10-02 MED ORDER — HYDROMORPHONE HCL 1 MG/ML IJ SOLN
0.5000 mg | INTRAMUSCULAR | Status: DC | PRN
  Administered 2023-10-03 (×3): 0.5 mg via INTRAVENOUS
  Filled 2023-10-02 (×2): qty 1

## 2023-10-02 MED ORDER — GLYCOPYRROLATE 0.2 MG/ML IJ SOLN: 0.2000 mg | INTRAMUSCULAR | Status: DC | PRN

## 2023-10-02 MED ORDER — GLYCOPYRROLATE 1 MG PO TABS
1.0000 mg | ORAL_TABLET | ORAL | Status: DC | PRN
Start: 2023-10-02 — End: 2023-10-03

## 2023-10-02 MED ORDER — ONDANSETRON 4 MG PO TBDP: 4.0000 mg | ORAL_TABLET | Freq: Four times a day (QID) | ORAL | Status: DC | PRN

## 2023-10-02 MED ORDER — POLYVINYL ALCOHOL 1.4 % OP SOLN: 1.0000 [drp] | Freq: Four times a day (QID) | OPHTHALMIC | Status: DC | PRN

## 2023-10-02 NOTE — Progress Notes (Signed)
 [facility] hospital liaison note   Received request for family interest in hospice inpatient unit. Talked with family by phone on 4.14 to discuss services and hospice philosophy of care and met at bedside with patient today.    Chart reviewed by hospice physician and at this time patient does not meet criteria for hospice inpatient unit.    He is appropriate for hospice services in the home or LTC facility and we will continue to assess for decline that would cause him to be approved for inpatient hospice care during this hospitalization.    Thank you for the opportunity to participate in this patient's care.    Madelene Schanz, BSN, RN, [facility] hospital liaison [phone]

## 2023-10-02 NOTE — Progress Notes (Signed)
 Palliative Medicine Inpatient Follow Up Note HPI: Jordan [name] is an 73 y.o. male with past medical history of hypertension, heart failure with preserved ejection fraction, atrial fibrillation, CAD, COPD with chronic hypoxic respiratory failure on 2 L of oxygen was brought into the hospital from skilled nursing facility with worsening left leg wound. Palliative care has been asked to support additional goals of care conversations in the setting of recent initiation of hemodialysis.   Today's Discussion 10/02/2023  *Please note that this is a verbal dictation therefore any spelling or grammatical errors are due to the "[employer]agon [employer] One" system interpretation.  Chart reviewed inclusive of vital signs, progress notes, laboratory results, and diagnostic images.   Family meeting held this afternoon in the presence of Jordan [name], his cousin(s) Acupuncturist, [place], and aunt, Renato Gails.  Providers present were myself and Dr. Arrie Aran via speaker phone. We reviewed Jordan [name] prolonged hospitalization and his lack of overall improvement. We discussed how patients often get weaker on dialysis and this appears to be what Jordan [name] is experiencing. Dr. Arrie Aran shared that he does not think dialysis is proving to be of benefit to [name]. He shares that we can continue doing it but the long term benefit is not likely to be great as it will likely lead to further decline. This too has been the thought among patient preceding nephrologist(s). He shared the alternative to dialysis and aggressive care is comfort focused care. He notes that Jordan [name] would likely have 1-2 weeks of like but could enjoy that as well as possible.   After the nephrologist left the conversation I was able to speak more with Jordan [name]. He does seem to understand the poor long term outcome(s) of dialysis. He does not that he has gotten worse and not better since iHD was started. He shares it is upsetting but he realizes the situation that he is in  presently.   We talked about transition to comfort measures in house and what that would entail inclusive of medications to control pain, dyspnea, agitation, nausea, itching, and hiccups.  We discussed stopping all uneccessary measures such as cardiac monitoring, blood draws, needle sticks, and frequent vital signs.   Chalres is in agreement with this. He has underlying pain which is chronic. We talked about starting low dose around the clock dilaudid for this which he and his family agreed to. We discussed the possibility of him being more sleepy overtime.   Utilized reflective listening throughout our time together.   Patients family would prefer if he was transitioned to a hospice home either in [place] or [place].   Questions and concerns addressed/Palliative Support Provided.   Objective Assessment: Vital Signs Vitals:   10/02/23 0734 10/02/23 0851  BP: (!) 88/55 96/69  Pulse: 77 77  Resp:    Temp: 98.4 F (36.9 C)   SpO2: 100%     Intake/Output Summary (Last 24 hours) at 10/02/2023 1412 Last data filed at 10/02/2023 0500 Gross per 24 hour  Intake --  Output 600 ml  Net -600 ml   Last Weight  [name] recent update: 10/01/2023  4:55 AM    Weight  99.5 kg (219 lb 5.7 oz)            Gen: Elderly Caucasian male chronically ill in appearance HEENT: moist mucous membranes CV: Irregular rate and rhythm PULM: On 2 L nasal cannula breathing is even and unlabored ABD: soft/nontender EXT: Generalized edema Neuro: Alert and oriented to self and place though at times disoriented  SUMMARY  OF RECOMMENDATIONS   DNAR/DNI   [name] / DNR Form on Chart   Comfort Care  Low dose dilaudid ATC  Additional comfort medications per [facility]  Unrestricted visitation  TOC to help with transition to Hospice Home in [place] [employer] or [place]    Ongoing palliative care support  Time: 72 Billing based on MDM:  High ______________________________________________________________________________________ [name] Palliative Medicine Team Team Cell Phone: [phone] Please utilize secure chat with additional questions, if there is no response within 30 minutes please call the above phone number  Palliative Medicine Team providers are available by phone from 7am to 7pm daily and can be reached through the team cell phone.  Should this patient require assistance outside of these hours, please call the patient's attending physician.

## 2023-10-02 NOTE — TOC Progression Note (Addendum)
 Transition of Care [facility]) - Progression Note    Patient Details  Name: Jordan [name] MRN: [id number] Date of Birth: Feb 20, 1951  Transition of Care [facility]) CM/SW Contact  Jannice Mends, LCSW Phone Number: 10/02/2023, 9:51 AM  Clinical Narrative:    9:51am-TOC continuing to follow for disposition needs. CSW requested [facility] review patient to make sure they can still accept him with new Dialysis needs.   2:09 PM-CSW received Hospice facility consult with family preference for [facility].. They are reviewing referral.  CSW spoke with patient's cousin/HCPOA, [name] who stated that Selena at [facility]'s business office is requesting an FL2 for patient in order to close out his account there. CSW unsure of why they would need an Fl2 since he is not returning so CSW left Selena a voicemail requesting clarity.  Expected Discharge Plan: Skilled Nursing Facility Barriers to Discharge: Continued Medical Work up  Expected Discharge Plan and Services In-house Referral: Clinical Social Work   Post Acute Care Choice: Skilled Nursing Facility Living arrangements for the past 2 months: Assisted Living Facility                                       Social Determinants of Health (SDOH) Interventions SDOH Screenings   Food Insecurity: Patient Declined (09/01/2023)  Housing: Patient Declined (09/01/2023)  Transportation Needs: Patient Declined (09/01/2023)  Utilities: Patient Declined (09/01/2023)  Social Connections: Patient Declined (09/01/2023)  Tobacco Use: High Risk (09/01/2023)    Readmission Risk Interventions    09/04/2023    2:49 PM 04/04/2022   11:02 AM  Readmission Risk Prevention Plan  Transportation Screening Complete Complete  PCP or Specialist Appt within 5-7 Days Complete   PCP or Specialist Appt within 3-5 Days  Complete  Home Care Screening Complete   Medication Review (RN CM) Complete   HRI or Home Care Consult  Complete  Social  Work Consult for Recovery Care Planning/Counseling  Complete  Palliative Care Screening  Not Applicable  Medication Review Oceanographer)  Complete

## 2023-10-02 NOTE — Progress Notes (Signed)
 PROGRESS NOTE  Jordan [name]  ZOX:[id number] DOB: [birth year] DOA: 09/01/2023 PCP: Lucina Sabal, NP  Consultants  Brief Narrative: 73 y.o. male with past medical history of hypertension, heart failure with preserved ejection fraction, atrial fibrillation, CAD, COPD with chronic hypoxic respiratory failure on 2 L of oxygen was brought into the hospital from skilled nursing facility with worsening left leg wound.  Patient with history of chronic venous stasis.  Patient stated that he was doing well until he got a new wheelchair last week and the nurses aide tried to put him in bed but lost her balance and he scraped his leg and his skin came off.  In the ED, patient was noted to have increased erythema over the denuded part of the skin.  WBC was elevated at 14, creatinine at 2.4 and blood pressure was 104/60.  Heart rate was 115.  Patient was started on vancomycin and cefepime and was admitted hospital for further evaluation and treatment.     During hospitalization, patient had atrial fibrillation with RVR and cardiology followed the patient.  He has been seen by nephrology and has been initiated on hemodialysis pending renal improvement.  Urology also followed the patient due to hematuria.  Patient will need  skilled nursing facility placement  when determination on dialysis has been made.  Family meeting with palliative care and nephrology scheduled for Monday.   Assessment and Plan:   Acute kidney injury - Previous baseline 1.3 last year.  Nephrology following closely.  Suspect cardiorenal etiology.  Renal function worsened after renal angiogram.  Temporary dialysis catheter placed 4/2 and subsequently initiated on intermittent HD.  Received IHD 4/2, 4/3, 4/5, 4/8, 4/10.  Urine output improving, possibility of kidney function recovering.  However creatinine worse this morning.  Will likely need repeat hemodialysis tomorrow.  Ongoing discussions about whether patient will need to pursue tunneled  dialysis catheter for ongoing outpatient HD.  Will continue to monitor urine output.  **Had family meeting with palliative care today.  Family and patient have moved to full comfort care.  Greatly appreciate nephrology and palliative care input.   Hematuria - Evaluated by urology.  Will have repeat cystoscopy performed in outpatient setting.  Hematuria appears to be improving.   BL LE cellulitis/dermatitis secondary to chronic venous stasis - Continues to improve.  Completed 10-day course of Keflex and 5-day course of doxycycline.  Patient to follow with Dr. Etta Heritage in the outpatient setting.  Wound care on board   Acute blood loss anemia - Secondary to hematuria.  Status post 1un pRBC 3/24, 4/5, 4/11.  Showing slow downtrend.  Will recheck CBC in AM.   Acute on chronic hypoxic respiratory failure - Continues to improve, now down to 2 L nasal cannula (patient's baseline).   Acute on chronic HFpEF with right pleural effusion - Volume status improved after iHD.   Pressure injury left heel unstageable - Noted on presentation.  Continue wound care.   Functional quadriplegia - Patient has been bedbound for greater than 6 months.  Originally from ALF.  Will likely need skilled facility on discharge.   Goals of care - DNR/DNI. Transition to Hospice Care in [place]       Pressure Injury 09/02/23 Heel Left Unstageable - Full thickness tissue loss in which the base of the injury is covered by slough (yellow, tan, gray, green or brown) and/or eschar (tan, brown or black) in the wound bed. (Active)  09/02/23 1713  Location: Heel  Location Orientation: Left  Staging:  Unstageable - Full thickness tissue loss in which the base of the injury is covered by slough (yellow, tan, gray, green or brown) and/or eschar (tan, brown or black) in the wound bed.  Wound Description (Comments):   Present on Admission: Yes  Dressing Type Foam - Lift dressing to assess site every shift  10/02/23 0137   DVT prophylaxis:  Place and maintain sequential compression device Start: 09/19/23 1109  Code Status:   Code Status: Limited: Do not attempt resuscitation (DNR) -DNR-LIMITED -Do Not Intubate/DNI  Level of care: Med-Surg Status is: Inpatient   Consults called: Nephrology, palliative care  Subjective: Patient without complaints this morning.  Eating and drinking well.  Still reports making good urine.  Objective: Vitals:   10/01/23 2053 10/02/23 0508 10/02/23 0734 10/02/23 0851  BP:  101/64 (!) 88/55 96/69  Pulse: 89 85 77 77  Resp: 16 18    Temp:  (!) 97.4 F (36.3 C) 98.4 F (36.9 C)   TempSrc:  Oral Oral   SpO2: 99% 92% 100%   Weight:      Height:        Intake/Output Summary (Last 24 hours) at 10/02/2023 1200 Last data filed at 10/02/2023 0500 Gross per 24 hour  Intake 120 ml  Output 600 ml  Net -480 ml   Filed Weights   09/28/23 1230 09/30/23 0712 10/01/23 0446  Weight: 100.1 kg 98 kg 99.5 kg   Body mass index is 28.94 kg/m.  Gen: 73 y.o. male in no apparent distress.  Nontoxic Pulm: Non-labored breathing.  Clear to auscultation bilaterally.  CV: Regular rate and rhythm. No murmur, rub, or gallop. No JVD GI: Abdomen soft, non-tender, non-distended, with normoactive bowel sounds. No organomegaly or masses felt. Ext: Warm, no deformities,  Skin: No rashes, lesions  Neuro: Alert and oriented. No focal neurological deficits. Psych: Calm  Judgement and insight appear normal. Mood & affect appropriate.     I have personally reviewed the following labs and images: CBC: Recent Labs  Lab 09/28/23 0357 09/29/23 0338 09/29/23 1648 09/30/23 0333 10/01/23 0344  WBC 8.9 8.3 9.6 9.9 9.6  HGB 7.5* 6.9* 8.5* 8.3* 7.8*  HCT 25.9* 23.5* 28.4* 28.5* 25.7*  MCV 85.2 85.5 83.5 84.6 84.3  PLT 272 229 265 266 248   BMP &GFR Recent Labs  Lab 09/27/23 0304 09/28/23 0357 09/29/23 0338 09/30/23 0333 10/01/23 0344 10/02/23 0310  NA 132*  132* 132*   131* 130* 132* 131* 133*  K 3.4*  3.4* 4.3  4.2 3.8 3.9 3.7 3.6  CL 97*  96* 96*  95* 96* 97* 95* 98  CO2 27  27 26  25 26 26 27 27   GLUCOSE 105*  104* 94  95 92 88 102* 95  BUN 34*  34* 46*  45* 32* 42* 54* 58*  CREATININE 4.37*  4.37* 5.57*  5.45* 4.20* 5.10* 5.78* 6.10*  CALCIUM 8.0*  8.0* 8.3*  8.3* 8.1* 8.4* 8.4* 8.4*  MG 2.0 2.1 1.9 2.0  --   --   PHOS 3.3 4.7* 3.1 4.5 4.3 4.2   Estimated Creatinine Clearance: 13.4 mL/min (A) (by C-G formula based on SCr of 6.1 mg/dL (H)). Liver & Pancreas: Recent Labs  Lab 09/28/23 0357 09/29/23 0338 09/30/23 0333 10/01/23 0344 10/02/23 0310  ALBUMIN 2.2* 2.3* 2.5* 2.3* 2.3*   No results for input(s): "LIPASE", "AMYLASE" in the last 168 hours. No results for input(s): "AMMONIA" in the last 168 hours. Diabetic: No results for input(s): "HGBA1C" in  the last 72 hours. Recent Labs  Lab 09/25/23 2033 09/27/23 0753  GLUCAP 101* 91   Cardiac Enzymes: No results for input(s): "CKTOTAL", "CKMB", "CKMBINDEX", "TROPONINI" in the last 168 hours. No results for input(s): "PROBNP" in the last 8760 hours. Coagulation Profile: No results for input(s): "INR", "PROTIME" in the last 168 hours. Thyroid Function Tests: No results for input(s): "TSH", "T4TOTAL", "FREET4", "T3FREE", "THYROIDAB" in the last 72 hours. Lipid Profile: No results for input(s): "CHOL", "HDL", "LDLCALC", "TRIG", "CHOLHDL", "LDLDIRECT" in the last 72 hours. Anemia Panel: No results for input(s): "VITAMINB12", "FOLATE", "FERRITIN", "TIBC", "IRON", "RETICCTPCT" in the last 72 hours. Urine analysis:    Component Value Date/Time   COLORURINE AMBER (A) 09/14/2023 1349   APPEARANCEUR CLOUDY (A) 09/14/2023 1349   LABSPEC 1.013 09/14/2023 1349   PHURINE 6.0 09/14/2023 1349   GLUCOSEU NEGATIVE 09/14/2023 1349   HGBUR MODERATE (A) 09/14/2023 1349   BILIRUBINUR NEGATIVE 09/14/2023 1349   KETONESUR NEGATIVE 09/14/2023 1349   PROTEINUR 100 (A) 09/14/2023 1349    NITRITE NEGATIVE 09/14/2023 1349   LEUKOCYTESUR TRACE (A) 09/14/2023 1349   Sepsis Labs: Invalid input(s): "PROCALCITONIN", "LACTICIDVEN"  Microbiology: No results found for this or any previous visit (from the past 240 hours).  Radiology Studies: No results found.  Scheduled Meds:  sodium chloride   Intravenous Once   atorvastatin  80 mg Oral QODAY   Chlorhexidine Gluconate Cloth  6 each Topical Q0600   darbepoetin (ARANESP) injection - DIALYSIS  150 mcg Subcutaneous Q Sat-1800   feeding supplement (NEPRO CARB STEADY)  237 mL Oral BID BM   furosemide  40 mg Oral BID   heparin sodium (porcine)  2,800 Units Intravenous Once   LORazepam  1 mg Oral BID   metoprolol tartrate  25 mg Oral BID   mometasone-formoterol  2 puff Inhalation BID   multivitamin with minerals  1 tablet Oral Q breakfast   pantoprazole  40 mg Oral Daily   sevelamer carbonate  800 mg Oral TID WC   sodium chloride flush  3 mL Intravenous Q12H   venlafaxine XR  75 mg Oral Q breakfast   Continuous Infusions:   LOS: 31 days   35 minutes with more than 50% spent in reviewing records, counseling patient/family and coordinating care.  Trenton Frock, MD [facility] [email or web address] 10/02/2023, 12:00 PM

## 2023-10-02 NOTE — Plan of Care (Signed)
  Problem: Health Behavior/Discharge Planning: Goal: Ability to manage health-related needs will improve Outcome: Progressing   Problem: Clinical Measurements: Goal: Ability to maintain clinical measurements within normal limits will improve Outcome: Progressing Goal: Will remain free from infection Outcome: Progressing Goal: Diagnostic test results will improve Outcome: Progressing Goal: Respiratory complications will improve Outcome: Progressing Goal: Cardiovascular complication will be avoided Outcome: Progressing   Problem: Activity: Goal: Risk for activity intolerance will decrease Outcome: Progressing   Problem: Nutrition: Goal: Adequate nutrition will be maintained Outcome: Progressing   Problem: Coping: Goal: Level of anxiety will decrease Outcome: Progressing   Problem: Elimination: Goal: Will not experience complications related to bowel motility Outcome: Progressing Goal: Will not experience complications related to urinary retention Outcome: Progressing   Problem: Pain Managment: Goal: General experience of comfort will improve and/or be controlled Outcome: Progressing   Problem: Safety: Goal: Ability to remain free from injury will improve Outcome: Progressing   Problem: Skin Integrity: Goal: Risk for impaired skin integrity will decrease Outcome: Progressing   Problem: Clinical Measurements: Goal: Ability to avoid or minimize complications of infection will improve Outcome: Progressing   Problem: Skin Integrity: Goal: Skin integrity will improve Outcome: Progressing   Problem: Education: Goal: Understanding of CV disease, CV risk reduction, and recovery process will improve Outcome: Progressing Goal: Individualized Educational Video(s) Outcome: Progressing   Problem: Activity: Goal: Ability to return to baseline activity level will improve Outcome: Progressing   Problem: Cardiovascular: Goal: Ability to achieve and maintain adequate  cardiovascular perfusion will improve Outcome: Progressing Goal: Vascular access site(s) Level 0-1 will be maintained Outcome: Progressing   Problem: Health Behavior/Discharge Planning: Goal: Ability to safely manage health-related needs after discharge will improve Outcome: Progressing   Problem: Education: Goal: Knowledge of the prescribed therapeutic regimen will improve Outcome: Progressing   Problem: Coping: Goal: Ability to identify and develop effective coping behavior will improve Outcome: Progressing   Problem: Clinical Measurements: Goal: Quality of life will improve Outcome: Progressing   Problem: Respiratory: Goal: Verbalizations of increased ease of respirations will increase Outcome: Progressing   Problem: Role Relationship: Goal: Family's ability to cope with current situation will improve Outcome: Progressing Goal: Ability to verbalize concerns, feelings, and thoughts to partner or family member will improve Outcome: Progressing   Problem: Pain Management: Goal: Satisfaction with pain management regimen will improve Outcome: Progressing

## 2023-10-02 NOTE — Progress Notes (Signed)
 Patient ID: Jordan [name], male   DOB: [birth year], 73 y.o.   MRN: [id number] S: No new complaints.  O:BP 96/69 (BP Location: Left Arm)   Pulse 77   Temp 98.4 F (36.9 C) (Oral)   Resp 18   Ht 6\' 1"  (1.854 m)   Wt 99.5 kg   SpO2 100%   BMI 28.94 kg/m   Intake/Output Summary (Last 24 hours) at 10/02/2023 1347 Last data filed at 10/02/2023 0500 Gross per 24 hour  Intake --  Output 600 ml  Net -600 ml   Intake/Output: I/O last 3 completed shifts: In: 360 [P.O.:360] Out: 850 [Urine:850]  Intake/Output this shift:  No intake/output data recorded. Weight change:  Gen: NAD CVS: RRR Resp: CTA Abd: +BS, soft, NT/ND Ext: 1+ pretibial edema bilaterally  Recent Labs  Lab 09/26/23 0227 09/27/23 0304 09/28/23 0357 09/29/23 0338 09/30/23 0333 10/01/23 0344 10/02/23 0310  NA 131* 132*  132* 132*  131* 130* 132* 131* 133*  K 3.7 3.4*  3.4* 4.3  4.2 3.8 3.9 3.7 3.6  CL 95* 97*  96* 96*  95* 96* 97* 95* 98  CO2 26 27  27 26  25 26 26 27 27   GLUCOSE 94 105*  104* 94  95 92 88 102* 95  BUN 53* 34*  34* 46*  45* 32* 42* 54* 58*  CREATININE 6.17* 4.37*  4.37* 5.57*  5.45* 4.20* 5.10* 5.78* 6.10*  ALBUMIN 2.0* 2.2* 2.2* 2.3* 2.5* 2.3* 2.3*  CALCIUM 8.4* 8.0*  8.0* 8.3*  8.3* 8.1* 8.4* 8.4* 8.4*  PHOS 4.3 3.3 4.7* 3.1 4.5 4.3 4.2   Liver Function Tests: Recent Labs  Lab 09/30/23 0333 10/01/23 0344 10/02/23 0310  ALBUMIN 2.5* 2.3* 2.3*   No results for input(s): "LIPASE", "AMYLASE" in the last 168 hours. No results for input(s): "AMMONIA" in the last 168 hours. CBC: Recent Labs  Lab 09/28/23 0357 09/29/23 0338 09/29/23 1648 09/30/23 0333 10/01/23 0344  WBC 8.9 8.3 9.6 9.9 9.6  HGB 7.5* 6.9* 8.5* 8.3* 7.8*  HCT 25.9* 23.5* 28.4* 28.5* 25.7*  MCV 85.2 85.5 83.5 84.6 84.3  PLT 272 229 265 266 248   Cardiac Enzymes: No results for input(s): "CKTOTAL", "CKMB", "CKMBINDEX", "TROPONINI" in the last 168 hours. CBG: Recent Labs  Lab 09/25/23 2033  09/27/23 0753  GLUCAP 101* 91    Iron Studies: No results for input(s): "IRON", "TIBC", "TRANSFERRIN", "FERRITIN" in the last 72 hours. Studies/Results: No results found.  sodium chloride   Intravenous Once   heparin sodium (porcine)  2,800 Units Intravenous Once    HYDROmorphone (DILAUDID) injection  0.5 mg Intravenous Q8H    BMET    Component Value Date/Time   NA 133 (L) 10/02/2023 0310   NA 139 07/02/2021 1322   K 3.6 10/02/2023 0310   CL 98 10/02/2023 0310   CO2 27 10/02/2023 0310   GLUCOSE 95 10/02/2023 0310   BUN 58 (H) 10/02/2023 0310   BUN 26 07/02/2021 1322   CREATININE 6.10 (H) 10/02/2023 0310   CALCIUM 8.4 (L) 10/02/2023 0310   GFRNONAA 9 (L) 10/02/2023 0310   GFRAA 65 07/23/2020 1533   CBC    Component Value Date/Time   WBC 9.6 10/01/2023 0344   RBC 3.05 (L) 10/01/2023 0344   HGB 7.8 (L) 10/01/2023 0344   HGB 11.0 (L) 07/02/2021 1322   HCT 25.7 (L) 10/01/2023 0344   HCT 35.0 (L) 07/02/2021 1322   PLT 248 10/01/2023 0344   PLT 266 07/02/2021 1322  MCV 84.3 10/01/2023 0344   MCV 81 07/02/2021 1322   MCH 25.6 (L) 10/01/2023 0344   MCHC 30.4 10/01/2023 0344   RDW 23.2 (H) 10/01/2023 0344   RDW 15.3 07/02/2021 1322   LYMPHSABS 1.2 09/04/2023 0407   MONOABS 0.8 09/04/2023 0407   EOSABS 0.4 09/04/2023 0407   BASOSABS 0.0 09/04/2023 0407     Assessment/Plan: AKI: b/l creat is 1.3, from Jan 2024. Assuming this is AKI but could also be CKD. UA shows some rbc's/ wbc's, no proteinuria. Renal US  w/o obstruction. BP's were soft in 90s on presentation and then improved after IV abx and IVF's, but pt developed pulm edema. Suspect AKI due to CHF/ cardiorenal. Started IV lasix 3/19 -  Diuresed well x several days then BP dropped 3/23 UOP dropped. A renal CO2 angiogram was negative for renal artery stenosis.unfortunately the Scr cont to rise.  Repeat renal US  showed no hydro, exam and CXR showed persistent fluid overload. IR placed temp cath 4/02. Pt was started on iHD  4/2, and got his 2nd HD 4/3 and 3rd session 4/5             -  HD  4/8 and 09/28/23             - he is wheelchair/ bedbound at baseline and it appears he has dementia- he is a very poor long-term dialysis candidate             - appreciate GOC--> discussed with Palliative and with pt's cousin, Jordan [name].  We discussed GOC.              - has a nontunneled HD cath in place for 10 days- we had a family discussion with palliative care today and they have decided to stop dialysis and will transition to comfort care.  Will d/c HD catheter and sign off.  Please call with questions or concerns.    Volume - diffuse LE edema and +CHF on all CXR's. Volume overload improving on exam, UOP picking up-  added some lasix but UOP dropped off again  BP - bp's soft, metoprolol was stopped.  Atrial fib - metoprolol dc'd due to soft BP's. HR's 100-120s, cardiology consulting- is back on   Gross hematuria: appreciate urology assistance.  Anemia - chronic issue. 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

## 2023-10-03 DIAGNOSIS — L03116 Cellulitis of left lower limb: Secondary | ICD-10-CM | POA: Diagnosis not present

## 2023-10-03 MED ORDER — POLYVINYL ALCOHOL 1.4 % OP SOLN: 1.0000 [drp] | Freq: Four times a day (QID) | OPHTHALMIC | 0 refills | Status: DC | PRN

## 2023-10-03 MED ORDER — LORAZEPAM 2 MG/ML IJ SOLN: 0.5000 mg | INTRAMUSCULAR | 0 refills | Status: DC | PRN

## 2023-10-03 MED ORDER — ONDANSETRON 4 MG PO TBDP: 4.0000 mg | ORAL_TABLET | Freq: Four times a day (QID) | ORAL | 0 refills | Status: DC | PRN

## 2023-10-03 MED ORDER — CAMPHOR-MENTHOL 0.5-0.5 % EX LOTN: TOPICAL_LOTION | CUTANEOUS | 0 refills | Status: DC | PRN

## 2023-10-03 MED ORDER — HYDROMORPHONE HCL 1 MG/ML IJ SOLN: 0.5000 mg | Freq: Three times a day (TID) | INTRAMUSCULAR | 0 refills | Status: DC

## 2023-10-03 MED ORDER — GLYCOPYRROLATE 1 MG PO TABS: 1.0000 mg | ORAL_TABLET | ORAL | 0 refills | Status: DC | PRN

## 2023-10-03 MED ORDER — HYDROMORPHONE HCL 1 MG/ML IJ SOLN: 0.5000 mg | INTRAMUSCULAR | 0 refills | Status: DC | PRN

## 2023-10-03 NOTE — TOC Transition Note (Addendum)
 Transition of Care [facility]) - Discharge Note   Patient Details  Name: Jordan [name] MRN: [id number] Date of Birth: [birth year]  Transition of Care [facility]) CM/SW Contact:  Juliane Och, LCSW Phone Number: 10/03/2023, 2:01 PM   Clinical Narrative:     Patient will DC to: [facility] Anticipated DC date: 10/03/2023 Family notified: Micah Ade; [place]; [phone] Transport by: Lyna Sandhoff   Per MD patient ready for DC to [facility]. RN to call report prior to discharge [phone]). RN, patient's family, and facility notified of DC via Madelene Schanz of [facility] (patient has memory impairment and is not fully oriented). DC packet on chart. Ambulance transport requested for patient at 13:59 (will call list).   CSW will sign off for now as social work intervention is no longer needed. Please consult us  again if new needs arise.    Final next level of care: Hospice Medical Facility Barriers to Discharge: Barriers Resolved   Patient Goals and CMS Choice Patient states their goals for this hospitalization and ongoing recovery are:: return to ALF          Discharge Placement              Patient chooses bed at: Other - please specify in the comment section below: [facility]) Patient to be transferred to facility by: [facility] Name of family member notified: Micah Ade; Arbie Knock; [phone] Patient and family notified of of transfer: 10/03/23  Discharge Plan and Services Additional resources added to the After Visit Summary for   In-house Referral: Clinical Social Work   Post Acute Care Choice: Skilled Nursing Facility                               Social Drivers of Health (SDOH) Interventions SDOH Screenings   Food Insecurity: Patient Declined (09/01/2023)  Housing: Patient Declined (09/01/2023)  Transportation Needs: Patient Declined (09/01/2023)  Utilities: Patient Declined (09/01/2023)  Social Connections: Patient Declined (09/01/2023)  Tobacco Use:  High Risk (09/01/2023)     Readmission Risk Interventions    09/04/2023    2:49 PM 04/04/2022   11:02 AM  Readmission Risk Prevention Plan  Transportation Screening Complete Complete  PCP or Specialist Appt within 5-7 Days Complete   PCP or Specialist Appt within 3-5 Days  Complete  Home Care Screening Complete   Medication Review (RN CM) Complete   HRI or Home Care Consult  Complete  Social Work Consult for Recovery Care Planning/Counseling  Complete  Palliative Care Screening  Not Applicable  Medication Review Oceanographer)  Complete

## 2023-10-03 NOTE — Progress Notes (Signed)
 Patient discharging to [facility] this evening. Report called to [facility] and all questions answered at that time. PIV remains in place per facility request.

## 2023-10-03 NOTE — Progress Notes (Signed)
   Palliative Medicine Inpatient Follow Up Note HPI: Jordan [name] is an 73 y.o. male with past medical history of hypertension, heart failure with preserved ejection fraction, atrial fibrillation, CAD, COPD with chronic hypoxic respiratory failure on 2 L of oxygen was brought into the hospital from skilled nursing facility with worsening left leg wound. Palliative care has been asked to support additional goals of care conversations in the setting of recent initiation of hemodialysis.   Today's Discussion 10/03/2023  *Please note that this is a verbal dictation therefore any spelling or grammatical errors are due to the "[employer] One" system interpretation.  Chart reviewed inclusive of vital signs, progress notes, laboratory results, and diagnostic images. (+) 2 additional doses of dilaudid in last 24 hours.   Per patients RN Deandrea was able to eat some breakfast then received medications for pain and nausea.   I met with Jordan [name] at bedside this morning. He is resting peacefully and appears in NAD.   Plan for [facility] evaluation for inpatient hospice.   Questions and concerns addressed/Palliative Support Provided.   Objective Assessment: Vital Signs Vitals:   10/02/23 2016 10/03/23 0700  BP: 104/63 116/70  Pulse: (!) 106 (!) 116  Resp: 16   Temp: 97.9 F (36.6 C)   SpO2: 97% 95%    Intake/Output Summary (Last 24 hours) at 10/03/2023 1036 Last data filed at 10/03/2023 0900 Gross per 24 hour  Intake 120 ml  Output --  Net 120 ml   Last Weight  Most recent update: 10/01/2023  4:55 AM    Weight  99.5 kg (219 lb 5.7 oz)            Gen: Elderly Caucasian male chronically ill in appearance HEENT: moist mucous membranes CV: Irregular rate and rhythm PULM: On 2 L nasal cannula breathing is even and unlabored ABD: soft/nontender EXT: Generalized edema Neuro: Alert and oriented to self and place though at times disoriented  SUMMARY OF RECOMMENDATIONS    DNAR/DNI   MOST / DNR Form on Chart   Comfort Care  Low dose dilaudid ATC with additional PRN doses  Additional comfort medications per [facility]  Unrestricted visitation  TOC to help with transition to Hospice Home in [place] or [place]    Ongoing palliative care support  Billing based on MDM: High based upon controlled substance review and modification(s).  ______________________________________________________________________________________ [facility] [place] Palliative Medicine Team Team Cell Phone: [phone] Please utilize secure chat with additional questions, if there is no response within 30 minutes please call the above phone number  Palliative Medicine Team providers are available by phone from 7am to 7pm daily and can be reached through the team cell phone.  Should this patient require assistance outside of these hours, please call the patient's attending physician.

## 2023-10-03 NOTE — Plan of Care (Signed)
  Problem: Health Behavior/Discharge Planning: Goal: Ability to manage health-related needs will improve Outcome: Adequate for Discharge   Problem: Clinical Measurements: Goal: Ability to maintain clinical measurements within normal limits will improve Outcome: Adequate for Discharge Goal: Will remain free from infection Outcome: Adequate for Discharge Goal: Diagnostic test results will improve Outcome: Adequate for Discharge Goal: Respiratory complications will improve Outcome: Adequate for Discharge Goal: Cardiovascular complication will be avoided Outcome: Adequate for Discharge   Problem: Activity: Goal: Risk for activity intolerance will decrease Outcome: Adequate for Discharge   Problem: Nutrition: Goal: Adequate nutrition will be maintained Outcome: Adequate for Discharge   Problem: Coping: Goal: Level of anxiety will decrease Outcome: Adequate for Discharge   Problem: Elimination: Goal: Will not experience complications related to bowel motility Outcome: Adequate for Discharge Goal: Will not experience complications related to urinary retention Outcome: Adequate for Discharge   Problem: Pain Managment: Goal: General experience of comfort will improve and/or be controlled Outcome: Adequate for Discharge   Problem: Safety: Goal: Ability to remain free from injury will improve Outcome: Adequate for Discharge   Problem: Skin Integrity: Goal: Risk for impaired skin integrity will decrease Outcome: Adequate for Discharge   Problem: Clinical Measurements: Goal: Ability to avoid or minimize complications of infection will improve Outcome: Adequate for Discharge   Problem: Skin Integrity: Goal: Skin integrity will improve Outcome: Adequate for Discharge   Problem: Education: Goal: Understanding of CV disease, CV risk reduction, and recovery process will improve Outcome: Adequate for Discharge Goal: Individualized Educational Video(s) Outcome: Adequate for  Discharge   Problem: Activity: Goal: Ability to return to baseline activity level will improve Outcome: Adequate for Discharge   Problem: Cardiovascular: Goal: Ability to achieve and maintain adequate cardiovascular perfusion will improve Outcome: Adequate for Discharge Goal: Vascular access site(s) Level 0-1 will be maintained Outcome: Adequate for Discharge   Problem: Health Behavior/Discharge Planning: Goal: Ability to safely manage health-related needs after discharge will improve Outcome: Adequate for Discharge   Problem: Education: Goal: Knowledge of the prescribed therapeutic regimen will improve Outcome: Adequate for Discharge   Problem: Coping: Goal: Ability to identify and develop effective coping behavior will improve Outcome: Adequate for Discharge   Problem: Clinical Measurements: Goal: Quality of life will improve Outcome: Adequate for Discharge   Problem: Respiratory: Goal: Verbalizations of increased ease of respirations will increase Outcome: Adequate for Discharge   Problem: Role Relationship: Goal: Family's ability to cope with current situation will improve Outcome: Adequate for Discharge Goal: Ability to verbalize concerns, feelings, and thoughts to partner or family member will improve Outcome: Adequate for Discharge   Problem: Pain Management: Goal: Satisfaction with pain management regimen will improve Outcome: Adequate for Discharge

## 2023-10-03 NOTE — Discharge Summary (Signed)
 Physician Discharge Summary   Patient: Jordan [name] MRN: [id number] DOB: 1950-12-13  Admit date:     09/01/2023  Discharge date: 10/03/23  Discharge Physician: Trenton Frock   PCP: Lucina Sabal, NP    Discharge Diagnoses: Principal Problem:   Left leg cellulitis Active Problems:   Acute renal failure (ARF) (HCC)   Gross hematuria   Hypotension   Pleural effusion on right   Essential hypertension   HLD (hyperlipidemia)   Chronic venous stasis dermatitis of both lower extremities   COPD (chronic obstructive pulmonary disease) (HCC)   S/P TAVR (transcatheter aortic valve replacement)   Chronic heart failure with preserved ejection fraction (HCC)   Persistent atrial fibrillation (HCC)   Chronic respiratory failure with hypoxia (HCC)   Hyponatremia  Resolved Problems:   * No resolved hospital problems. *  Hospital Course: 73 y.o. male with past medical history of hypertension, heart failure with preserved ejection fraction, atrial fibrillation, CAD, COPD with chronic hypoxic respiratory failure on 2 L of oxygen was brought into the hospital from skilled nursing facility with worsening left leg wound.  Patient with history of chronic venous stasis.  Patient stated that he was doing well until he got a new wheelchair last week and the nurses aide tried to put him in bed but lost her balance and he scraped his leg and his skin came off.  In the ED, patient was noted to have increased erythema over the denuded part of the skin.  WBC was elevated at 14, creatinine at 2.4 and blood pressure was 104/60.  Heart rate was 115.  Patient was started on vancomycin and cefepime and was admitted hospital for further evaluation and treatment.     During hospitalization, patient had atrial fibrillation with RVR and cardiology followed the patient.  He has been seen by nephrology and has been initiated on hemodialysis pending renal improvement.  Urology also followed the patient due to  hematuria.  Patient will need  skilled nursing facility placement  when determination on dialysis has been made.  Day prior to discharge patient and family had family meeting with nephrology and palliative care.  Deemed hemodialysis will not likely prolong life nor prolong quality of life for patient.  Patient therefore elected to go full comfort care on 10/02/2023.  Bed at [facility] obtained on 10/03/2023 and patient discharged to [facility] for continued hospice care.  Discharged with hydromorphone, Ativan, Robinul medications all prescribed and to be used at [facility].   Assessment and Plan:   Acute kidney injury -Made for comfort care on 10/02/2023 secondary to this.   Hematuria - Evaluated by urology.  Will have repeat cystoscopy performed in outpatient setting.  Hematuria appears to be improving.   BL LE cellulitis/dermatitis secondary to chronic venous stasis - Continues to improve.  Completed 10-day course of Keflex and 5-day course of doxycycline.  Patient to follow with Dr. Etta Heritage in the outpatient setting.  Wound care on board   Acute blood loss anemia - Secondary to hematuria.  Status post 1un pRBC 3/24, 4/5, 4/11.  Showing slow downtrend.    Acute on chronic hypoxic respiratory failure - Continues to improve, now down to 2 L nasal cannula (patient's baseline).   Acute on chronic HFpEF with right pleural effusion - Volume status improved after iHD.   Pressure injury left heel unstageable - Noted on presentation.  Continue wound care.        Consultants: Palliative care, nephrology  Disposition: [facility]  for hospice Diet recommendation:  Discharge Diet Orders (From admission, onward)     Start     Ordered   10/03/23 0000  Diet - low sodium heart healthy        10/03/23 1427           Regular diet DISCHARGE MEDICATION: Allergies as of 10/03/2023       Reactions   Altace [ramipril] Other (See Comments)   Dizziness, migraine, weakness- "there is  dye in it"   Codeine Itching   Metoprolol Tartrate Other (See Comments)   Dropped the B/P too low- eventually stopped by a MD   Naproxen Itching        Medication List     STOP taking these medications    ACETAMINOPHEN-BUTALBITAL 50-325 MG Tabs   albuterol 108 (90 Base) MCG/ACT inhaler Commonly known as: VENTOLIN HFA   atenolol 50 MG tablet Commonly known as: TENORMIN   atorvastatin 80 MG tablet Commonly known as: LIPITOR   budesonide-formoterol 160-4.5 MCG/ACT inhaler Commonly known as: Symbicort   doxycycline 100 MG tablet Commonly known as: VIBRA-TABS   Effexor XR 75 MG 24 hr capsule Generic drug: venlafaxine XR   Eliquis 5 MG Tabs tablet Generic drug: apixaban   ezetimibe 10 MG tablet Commonly known as: ZETIA   furosemide 40 MG tablet Commonly known as: LASIX   LORazepam 1 MG tablet Commonly known as: ATIVAN Replaced by: LORazepam 2 MG/ML injection   miconazole 2 % cream Commonly known as: MICOTIN   montelukast 10 MG tablet Commonly known as: SINGULAIR   multivitamin with minerals Tabs tablet   pantoprazole 40 MG tablet Commonly known as: PROTONIX   traMADol 50 MG tablet Commonly known as: ULTRAM   Vitamin D (Ergocalciferol) 50000 units Caps       TAKE these medications    camphor-menthol lotion Commonly known as: SARNA Apply topically as needed for itching.   glycopyrrolate 1 MG tablet Commonly known as: ROBINUL Take 1 tablet (1 mg total) by mouth every 4 (four) hours as needed (excessive secretions).   HYDROmorphone 1 MG/ML injection Commonly known as: DILAUDID Inject 0.5 mLs (0.5 mg total) into the vein every 8 (eight) hours.   HYDROmorphone 1 MG/ML injection Commonly known as: DILAUDID Inject 0.5-1 mLs (0.5-1 mg total) into the vein every hour as needed (Pain/Dyspnea).   LORazepam 2 MG/ML injection Commonly known as: ATIVAN Inject 0.25-0.5 mLs (0.5-1 mg total) into the vein every hour as needed for anxiety, seizure or  sedation. Replaces: LORazepam 1 MG tablet   melatonin 5 MG Tabs Take 5 mg by mouth.   ondansetron 4 MG disintegrating tablet Commonly known as: ZOFRAN-ODT Take 1 tablet (4 mg total) by mouth every 6 (six) hours as needed for nausea.   polyvinyl alcohol 1.4 % ophthalmic solution Commonly known as: LIQUIFILM TEARS Place 1 drop into both eyes 4 (four) times daily as needed for dry eyes.               Discharge Care Instructions  (From admission, onward)           Start     Ordered   10/03/23 0000  Discharge wound care:       Comments: Continue as in the hospital   10/03/23 1427            Contact information for after-discharge care     Destination     [facility]erred SNF .   Service: Skilled Nursing Contact information:  [address] [place]  [address] [phone]                    Discharge Exam: Cleavon Curls Weights   09/28/23 1230 09/30/23 0712 10/01/23 0446  Weight: 100.1 kg 98 kg 99.5 kg   Gen: Elderly Caucasian male chronically ill in appearance HEENT: moist mucous membranes CV: Irregular rate and rhythm PULM: On 2 L nasal cannula breathing is even and unlabored ABD: soft/nontender EXT: Generalized edema Neuro: Alert and oriented to self and place  Condition at discharge: good  The results of significant diagnostics from this hospitalization (including imaging, microbiology, ancillary and laboratory) are listed below for reference.   Imaging Studies: CT CYSTOGRAM ABD/PELVIS Result Date: 09/25/2023 CLINICAL DATA:  Hematuria.  History of chronic kidney disease. EXAM: CT CYSTOGRAM (CT ABDOMEN AND PELVIS WITH CONTRAST) TECHNIQUE: Multi-detector CT imaging through the abdomen and pelvis was performed after 350 mL dilute Omnipaque 300 contrast had been introduced into the bladder for the purposes of performing CT cystography. RADIATION DOSE REDUCTION: This exam was performed according to  the departmental dose-optimization program which includes automated exposure control, adjustment of the mA and/or kV according to patient size and/or use of iterative reconstruction technique. CONTRAST:  30mL OMNIPAQUE IOHEXOL 300 MG/ML  SOLN COMPARISON:  CT with IV and oral contrast 08/06/2020, PET-CT 08/14/2020. More recently a chest CT was obtained 09/19/2023. FINDINGS: Lower chest: Moderate to large bilateral pleural effusions continue to be seen, with compressive collapse of the right greater than left lower lobes versus underlying consolidation. The pleural fluid volume has not notably changed, but there is mild interstitial edema in the anterior lung bases with improvement. TAVR again noted aortic valve plane, heavy three-vessel coronary calcification. Stable cardiomegaly. The cardiac blood pool is low in density consistent with anemia. No substantial pericardial fluid. Hepatobiliary: There is loss of fine detail due to respiratory motion, artifact from the patient's arms in the field and lack of IV contrast. No liver mass is seen grossly. The gallbladder is contracted and not well demonstrated but there is no bile duct dilatation. Pancreas: Unremarkable without contrast. Spleen: Unremarkable without contrast. No abnormality is seen through the breathing motion. Adrenals/Urinary Tract: No adrenal mass. Again noted is a 1.6 cm Bosniak 1 cyst in the upper pole of the left kidney, Hounsfield density is 20.4, and another more superiorly in the left upper pole measuring 3 cm, Hounsfield density is 19. No follow-up imaging is recommended. No other contour deforming abnormality of the unenhanced kidneys is seen. There is no urinary stone or obstruction. There is trabeculation and scattered small diverticula of the mid to superior bladder wall, largest diverticulum is 1.5 cm to the right. There is a focal disproportionate wall thickening along the superior wall of the bladder best seen on the sagittal reformatting,  on 7:89 measuring 3.8 by 1.5 cm AP and craniocaudal. On axial images is less well defined but measures about 2.7 cm AP. Mucosal mass is not excluded. Direct visualization is recommended. On the 2022 studies the bladder wall was thickened but in more generalized fashion and without asymmetries or trabeculation. Pre and postvoid imaging through the bladder revealed no evidence of bladder wall perforation or contrast extravasation. Stomach/Bowel: Moderate fold thickening in the proximal stomach is chronically seen. There is no small bowel obstruction or inflammation. The appendix is normal. There is moderate retained stool including focally in the rectum. There is dorsal perirectal stranding which could be due to dependent congestive change or stercoral proctitis.  Vascular/Lymphatic: There is moderate to heavy aortoiliac calcific plaque. No AAA. There are borderline prominent bilateral inguinal chain nodes up to 9 mm in short axis, previously about twice this size. Slightly prominent external iliac chain nodes measuring 1 cm in short axis on the left and 1.2 cm on the right, also were previously larger. There are Portocaval lymph nodes up to 1.3 cm in short axis, also previously larger. Reproductive: No prostatomegaly. Other: Mild body wall anasarca appears similar to the prior study. There is mild rectus diastasis and outward protrusion containing nonobstructed small bowel loops. There is no incarcerated hernia. Minimal pelvic ascites. No other free fluid. No free air. Musculoskeletal: There is dextroscoliosis and advanced degenerative changes of the lumbar spine and advanced degenerative change in the lower thoracic spine. 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 Bridging osteophytes both anterior SI joints. IMPRESSION: 1. 3.8 x 1.5 x 2.7 cm focal disproportionate wall thickening along the superior wall of the bladder. Mucosal mass is not excluded. Direct visualization is  recommended. 2. Trabeculation and scattered small diverticula of the mid to superior bladder wall. No bladder wall perforation or contrast extravasation. 3. No urinary stone or obstruction. 4. Moderate to large bilateral pleural effusions with compressive collapse of the right greater than left lower lobes versus underlying consolidation. The pleural fluid volume has not notably changed from April 1, but there is mild interstitial edema in the anterior lung bases with improvement. 5. Cardiomegaly with TAVR.  Likely anemia. 6. Aortic and coronary artery atherosclerosis. 7. Chronic fold thickening in the proximal stomach. 8. Moderate retained stool including focally in the rectum with dorsal perirectal stranding which could be due to dependent congestive change or stercoral proctitis. 9. Mild body wall anasarca and minimal pelvic ascites. 10. 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 11. Borderline prominent inguinal, external iliac, and Portocaval lymph nodes, all of which were previously larger. Electronically Signed   By: Almira Bar M.D.   On: 09/25/2023 05:04   DG CHEST PORT 1 VIEW Result Date: 09/24/2023 CLINICAL DATA:  Follow-up pulmonary edema. EXAM: PORTABLE CHEST 1 VIEW COMPARISON:  Chest radiograph and CT 09/19/2023 FINDINGS: Right-sided central line tip overlies the atrial caval junction. Moderate bilateral pleural effusions, right greater than left, with slight improvement from prior exam. Similar interstitial thickening likely pulmonary edema. Unchanged cardiomegaly. TAVR. No pneumothorax. IMPRESSION: 1. Moderate bilateral pleural effusions, right greater than left, with slight improvement from prior exam. 2. Similar interstitial thickening likely pulmonary edema. 3. Unchanged cardiomegaly. Electronically Signed   By: Narda Rutherford M.D.   On: 09/24/2023 10:06   IR Fluoro Guide CV Line Right Result Date: 09/20/2023 INDICATION: 73 year old male referred for temporary hemodialysis  catheter EXAM: IMAGE GUIDED NON TUNNELED/TEMPORARY HEMODIALYSIS CATHETER MEDICATIONS: None ANESTHESIA/SEDATION: Moderate (conscious) sedation was not employed during this procedure. In FLUOROSCOPY TIME:  Fluoroscopy time: (2.1 MGy). COMPLICATIONS: None None PROCEDURE: formed written consent was obtained from the patient/family after a discussion of the risks, benefits, and alternatives to treatment. Questions regarding the procedure were encouraged and answered. The right neck was prepped with chlorhexidine in a sterile fashion, and a sterile drape was applied covering the operative field. Maximum barrier sterile technique with sterile gowns and gloves were used for the procedure. A timeout was performed prior to the initiation of the procedure. A micropuncture kit was utilized to access the right internal jugular vein under direct, real-time ultrasound guidance after the overlying soft tissues were anesthetized with 1% lidocaine with epinephrine. Ultrasound image documentation  was performed. The microwire was kinked to measure appropriate catheter length. A stiff glidewire was advanced to the level of the IVC. A 16 cm hemodialysis catheter was then placed over the wire. Wire was removed. This catheter would not function, apparently too short. Wire was then advanced to the IVC and a new 20 cm temporary catheter was placed. Final catheter positioning was confirmed and documented with a spot radiographic image. The 20 cm catheter aspirates and flushes normally. The catheter was flushed with appropriate volume heparin dwells. Dressings were applied. The patient tolerated the procedure well without immediate post procedural complication. IMPRESSION: Status post image guided placement of temporary right IJ triple-lumen hemodialysis catheter. Signed, Yvone Neu. Miachel Roux, RPVI [facility] Electronically Signed   By: Gilmer Mor D.O.   On: 09/20/2023 17:29    IR US Guide Vasc Access Right Result Date: 09/20/2023 INDICATION: 73 year old male referred for temporary hemodialysis catheter EXAM: IMAGE GUIDED NON TUNNELED/TEMPORARY HEMODIALYSIS CATHETER MEDICATIONS: None ANESTHESIA/SEDATION: Moderate (conscious) sedation was not employed during this procedure. In FLUOROSCOPY TIME:  Fluoroscopy time: (2.1 MGy). COMPLICATIONS: None None PROCEDURE: formed written consent was obtained from the patient/family after a discussion of the risks, benefits, and alternatives to treatment. Questions regarding the procedure were encouraged and answered. The right neck was prepped with chlorhexidine in a sterile fashion, and a sterile drape was applied covering the operative field. Maximum barrier sterile technique with sterile gowns and gloves were used for the procedure. A timeout was performed prior to the initiation of the procedure. A micropuncture kit was utilized to access the right internal jugular vein under direct, real-time ultrasound guidance after the overlying soft tissues were anesthetized with 1% lidocaine with epinephrine. Ultrasound image documentation was performed. The microwire was kinked to measure appropriate catheter length. A stiff glidewire was advanced to the level of the IVC. A 16 cm hemodialysis catheter was then placed over the wire. Wire was removed. This catheter would not function, apparently too short. Wire was then advanced to the IVC and a new 20 cm temporary catheter was placed. Final catheter positioning was confirmed and documented with a spot radiographic image. The 20 cm catheter aspirates and flushes normally. The catheter was flushed with appropriate volume heparin dwells. Dressings were applied. The patient tolerated the procedure well without immediate post procedural complication. IMPRESSION: Status post image guided placement of temporary right IJ triple-lumen hemodialysis catheter. Signed, Yvone Neu. Miachel Roux, RPVI [facility] Electronically Signed   By: Gilmer Mor D.O.   On: 09/20/2023 17:29   US RENAL Result Date: 09/19/2023 CLINICAL DATA:  Acute renal insufficiency. EXAM: RENAL / URINARY TRACT ULTRASOUND COMPLETE COMPARISON:  Renal ultrasound dated 09/06/2023. FINDINGS: Evaluation is limited due to body habitus. Right Kidney: Renal measurements: 10.8 x 4.5 x 5.3 cm = volume: 137 mL. Diffuse increased renal parenchymal echogenicity. No hydronephrosis or shadowing stone. A 12 mm hypoechoic lesion in the interpolar right kidney is poorly evaluated, possibly a cyst. Left Kidney: Renal measurements: 9.7 x 5.3 x 4.6 cm = volume: 123 mL. Diffuse parenchymal echogenicity. No hydronephrosis or shadowing stone. Bladder: The urinary bladder is poorly visualized and predominantly collapsed. Other: None. IMPRESSION: Echogenic kidneys in keeping with medical renal disease. No hydronephrosis or shadowing stone. Electronically Signed   By: Elgie Collard M.D.   On: 09/19/2023 17:27   DG Chest Bilateral Decubitus Result Date: 09/19/2023 CLINICAL DATA:  Pleural effusion. EXAM: CHEST - BILATERAL DECUBITUS  VIEW COMPARISON:  Chest CT dated 09/19/2023. FINDINGS: There is cardiomegaly with vascular congestion and edema. Bilateral pleural effusions with associated atelectasis, right greater than left. No pneumothorax. Stable cardiac silhouette. No acute osseous pathology. IMPRESSION: 1. Cardiomegaly with vascular congestion and edema. 2. Bilateral pleural effusions with associated atelectasis, right greater than left. Electronically Signed   By: Elgie Collard M.D.   On: 09/19/2023 15:25   CT CHEST WO CONTRAST Result Date: 09/19/2023 CLINICAL DATA:  Dyspnea of unclear etiology EXAM: CT CHEST WITHOUT CONTRAST TECHNIQUE: Multidetector CT imaging of the chest was performed following the standard protocol without IV contrast. RADIATION DOSE REDUCTION: This exam was performed according to the  departmental dose-optimization program which includes automated exposure control, adjustment of the mA and/or kV according to patient size and/or use of iterative reconstruction technique. COMPARISON:  09/18/2023, 08/06/2020 FINDINGS: Cardiovascular: Unenhanced imaging of the heart demonstrates borderline cardiomegaly without pericardial effusion. Aortic valve prosthesis is identified. Diffuse atherosclerosis throughout the coronary vasculature. Normal caliber of the thoracic aorta. Diffuse aortic atherosclerosis. Assessment of the vascular lumen cannot be performed without IV contrast. Mediastinum/Nodes: Mild mediastinal adenopathy, measuring up to 17 mm in the precarinal region and 14 mm in the AP window. Assessment of the hilar regions is limited without IV contrast. Thyroid, trachea, and esophagus are unremarkable. Lungs/Pleura: There are large bilateral pleural effusions, volume estimated at least 2 L each. Significant dependent compressive atelectasis, greatest in the lower lobes. Upper lobe predominant interlobular septal thickening and mild ground-glass airspace disease consistent with edema. No pneumothorax. Central airways are patent. Upper Abdomen: No acute abnormality. Musculoskeletal: There are no acute or destructive bony abnormalities. 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 Stable diffuse thoracic spondylosis and bilateral shoulder osteoarthritis. Reconstructed images demonstrate no additional findings. IMPRESSION: 1. Constellation of findings favoring congestive heart failure, with cardiomegaly, mild pulmonary edema, and large bilateral pleural effusions. 2. Mediastinal lymphadenopathy, slightly more pronounced than prior study. 3. Aortic Atherosclerosis (ICD10-I70.0). Coronary artery atherosclerosis. Electronically Signed   By: Sharlet Salina M.D.   On: 09/19/2023 14:59   DG Chest Port 1 View Result Date: 09/18/2023 CLINICAL DATA:  Cough. EXAM: PORTABLE CHEST 1 VIEW  COMPARISON:  Chest radiograph dated 09/06/2023. FINDINGS: Cardiomegaly with findings of CHF and bilateral pleural effusions similar or slightly progressed. No pneumothorax. No acute osseous pathology. IMPRESSION: Cardiomegaly with CHF and bilateral pleural effusions. Electronically Signed   By: Elgie Collard M.D.   On: 09/18/2023 13:08   DG Shoulder Right Result Date: 09/15/2023 CLINICAL DATA:  Right shoulder pain. EXAM: RIGHT SHOULDER - 2+ VIEW COMPARISON:  None Available. FINDINGS: Superior subluxation of the humeral head abutting the undersurface of the acromion. There is associated subcortical cystic change. Mild acromioclavicular degenerative change. No evidence of acute fracture. No focal bone abnormality. No soft tissue calcifications. IMPRESSION: 1. Superior subluxation of the humeral head abutting the undersurface of the acromion, consistent with chronic rotator cuff tear. 2. Mild acromioclavicular degenerative change. Electronically Signed   By: Narda Rutherford M.D.   On: 09/15/2023 19:58   PERIPHERAL VASCULAR CATHETERIZATION Result Date: 09/12/2023 Table formatting from the original result was not included. Images from the original result were not included.   Patient name: JOSEANGEL NETTLETON     MRN: [id number]        DOB: 1950-06-24          Sex: male  09/12/2023 Pre-operative Diagnosis: Left leg ulcer Post-operative diagnosis:  Same Surgeon:  Durene Cal Procedure Performed:  1.  Ultrasound-guided access, right femoral artery            2.  Abdominal gram with CO2            3.  Selective images with catheter in left popliteal artery            4.  Left leg angiogram            5.  Conscious sedation, 31 minutes            6.  Closure device, Celt              Indications: This is a 73 year old gentleman with nonhealing wounds on both legs left worse than the right.  He had ABIs that suggested monophasic waveforms.  He is here for further evaluation.  He does have renal insufficiency  and so contrast will be minimized.  Procedure:  The patient was identified in the holding area and taken to room 8.  The patient was then placed supine on the table and prepped and draped in the usual sterile fashion.  A time out was called.  Conscious sedation was administered with the use of IV fentanyl and Versed under continuous physician and nurse monitoring.  Heart rate, blood pressure, and oxygen saturation were continuously monitored.  Total sedation time was 31 minutes.  Ultrasound was used to evaluate the right common femoral artery.  It was patent .  A digital ultrasound image was acquired.  A micropuncture needle was used to access the right common femoral artery under ultrasound guidance.  An 018 wire was advanced without resistance and a micropuncture sheath was placed.  The 018 wire was removed and a benson wire was placed.  The micropuncture sheath was exchanged for a 5 french sheath.  An omniflush catheter was advanced over the wire to the level of L-1.  An abdominal angiogram with CO2 was obtained.  Next, using the omniflush catheter and a benson wire, the aortic bifurcation was crossed and the catheter was placed into theleft external iliac artery and left runoff was obtained.  Additional images were performed with a catheter in the left popliteal artery from the right groin  Findings:            Aortogram: No evidence of renal artery stenosis.  Due to bowel gas, the abdominal aorta was not well-evaluated.  There did not appear to be any significant stenosis.  Bilateral common and external iliac arteries were widely patent.            Right Lower Extremity: Not evaluated             Left Lower Extremity: The left common femoral, profundofemoral, and superficial femoral artery are widely patent.  The popliteal artery is widely patent.  The patient has three-vessel runoff.  The dominant vessel across the ankle is the anterior tibial.  There is mild stenosis in the mid posterior tibial artery,  however this does not have significant perfusion of the forefoot.  Intervention: No intervention was performed as the patient should have adequate blood flow to heal his wounds.  Impression:             #1  No significant aortoiliac occlusive disease             #2  No significant left femoral-popliteal stenosis             #3  Anterior tibial is a dominant vessel across the ankle perfusing the forefoot.  The posterior tibial does have approximately a 50% lesion in its midportion however this does not contribute much to the blood flow out onto the foot.  The patient should have adequate blood flow for wound healing             #4  Recommend outpatient wound follow-up with Dr. Trevor Fudge. Gareld June, M.D., FACS [facility] Office: [phone] Pager:  [phone]      ECHOCARDIOGRAM COMPLETE Result Date: 09/08/2023    ECHOCARDIOGRAM REPORT   Patient Name:   DASHON MCINTIRE Date of Exam: 09/08/2023 Medical Rec #:  [id number]         Height:       73.0 in Accession #:    [id number]        Weight:       228.4 lb Date of Birth:  [birth year]         BSA:          2.277 m Patient Age:    72 years          BP:           116/69 mmHg Patient Gender: M                 HR:           102 bpm. Exam Location:  Inpatient Procedure: 2D Echo, Cardiac Doppler, Color Doppler and Intracardiac            Opacification Agent (Both Spectral and Color Flow Doppler were            utilized during procedure). Indications:    Abnormal ECG  History:        Patient has prior history of Echocardiogram examinations, most                 recent 07/14/2021. CAD, PAD and TIA, Arrythmias:Bradycardia; Risk                 Factors:Dyslipidemia.                 Aortic Valve: 26 mm Sapien prosthetic, stented (TAVR) valve is                 present in the aortic position.  Sonographer:    Juanita Shaw Referring Phys: [id number] BELKYS A REGALADO  Sonographer Comments: Image acquisition challenging due to patient body habitus  and Image acquisition challenging due to respiratory motion. IMPRESSIONS  1. Very poor study, even with Definity contrast - almost uninterpretable.  2. Left ventricular ejection fraction appears grossly normal - estimated at 60 to 65%. The left ventricle has normal function. The left ventricle has no regional wall motion abnormalities. Left ventricular diastolic parameters are indeterminate.  3. Right ventricular systolic function was not well visualized. The right ventricular size is not well visualized.  4. The mitral valve was not well visualized. No evidence of mitral valve regurgitation.  5. TAVR valve is not well-visualized. The aortic valve was not well visualized. Aortic valve regurgitation is not visualized. There is a 26 mm Sapien prosthetic (TAVR) valve present in the aortic position. Comparison(s): Changes from prior study are noted. 07/14/2021: LVEF 55-60%, TAVR valve. FINDINGS  Left Ventricle: Left ventricular ejection fraction, by estimation, is 60 to 65%. The left ventricle has normal function. Left ventricular endocardial border not optimally defined to evaluate regional wall motion. Definity contrast agent was given IV to delineate the left ventricular endocardial borders. The  left ventricular internal cavity size was normal in size. There is no left ventricular hypertrophy. Left ventricular diastolic parameters are indeterminate. Right Ventricle: The right ventricular size is not well visualized. Right vetricular wall thickness was not well visualized. Right ventricular systolic function was not well visualized. Left Atrium: Left atrial size was not well visualized. Right Atrium: Right atrial size was not well visualized. Pericardium: The pericardium was not well visualized. Mitral Valve: The mitral valve was not well visualized. No evidence of mitral valve regurgitation. MV peak gradient, 5.8 mmHg. The mean mitral valve gradient is 2.0 mmHg. Tricuspid Valve: The tricuspid valve is not well  visualized. Tricuspid valve regurgitation is not demonstrated. Aortic Valve: TAVR valve is not well-visualized. The aortic valve was not well visualized. Aortic valve regurgitation is not visualized. Aortic valve mean gradient measures 2.0 mmHg. Aortic valve peak gradient measures 3.9 mmHg. Aortic valve area, by VTI  measures 2.12 cm. There is a 26 mm Sapien prosthetic, stented (TAVR) valve present in the aortic position. Pulmonic Valve: The pulmonic valve was not well visualized. Pulmonic valve regurgitation is not visualized. Aorta: The aortic root was not well visualized. IAS/Shunts: The interatrial septum was not well visualized.  LEFT VENTRICLE PLAX 2D LVIDd:         5.80 cm LVIDs:         4.10 cm LV PW:         0.80 cm LV IVS:        1.00 cm LVOT diam:     2.10 cm LV SV:         38 LV SV Index:   17 LVOT Area:     3.46 cm  LV Volumes (MOD) LV vol d, MOD A4C: 64.9 ml LV vol s, MOD A4C: 23.2 ml LV SV MOD A4C:     64.9 ml RIGHT VENTRICLE RV S prime:     8.49 cm/s LEFT ATRIUM           Index        RIGHT ATRIUM           Index LA diam:      4.50 cm 1.98 cm/m   RA Area:     12.80 cm LA Vol (A2C): 32.5 ml 14.28 ml/m  RA Volume:   30.40 ml  13.35 ml/m LA Vol (A4C): 89.7 ml 39.40 ml/m  AORTIC VALVE AV Area (Vmax):    2.46 cm AV Area (Vmean):   2.18 cm AV Area (VTI):     2.12 cm AV Vmax:           99.10 cm/s AV Vmean:          64.600 cm/s AV VTI:            0.178 m AV Peak Grad:      3.9 mmHg AV Mean Grad:      2.0 mmHg LVOT Vmax:         70.50 cm/s LVOT Vmean:        40.600 cm/s LVOT VTI:          0.109 m LVOT/AV VTI ratio: 0.61  AORTA Ao Root diam: 3.70 cm MITRAL VALVE MV Area (PHT): 5.31 cm     SHUNTS MV Area VTI:   1.38 cm     Systemic VTI:  0.11 m MV Peak grad:  5.8 mmHg     Systemic Diam: 2.10 cm MV Mean grad:  2.0 mmHg MV Vmax:       1.20 m/s MV  Vmean:      73.2 cm/s MV Decel Time: 143 msec MV E velocity: 120.00 cm/s Zoila Shutter MD Electronically signed by Zoila Shutter MD Signature Date/Time:  09/08/2023/4:59:33 PM    Final    DG CHEST PORT 1 VIEW Result Date: 09/06/2023 CLINICAL DATA:  Wheezing. EXAM: PORTABLE CHEST 1 VIEW COMPARISON:  September 01, 2023. FINDINGS: Stable cardiomediastinal silhouette. Mild central pulmonary vascular congestion is noted with probable bilateral pulmonary edema and bibasilar atelectasis and effusions. Bony thorax is unremarkable. IMPRESSION: Mild central pulmonary vascular congestion with probable bilateral pulmonary edema and bibasilar atelectasis and pleural effusions. Electronically Signed   By: Lupita Raider M.D.   On: 09/06/2023 13:36   US RENAL Result Date: 09/06/2023 CLINICAL DATA:  Acute renal insufficiency. EXAM: RENAL / URINARY TRACT ULTRASOUND COMPLETE COMPARISON:  None Available. FINDINGS: Evaluation is limited due to overlying bowel gas and body habitus. Right Kidney: Renal measurements: 8.5 x 4.8 x 4.9 cm = volume: 104 ML. Mildly atrophic. Normal echogenicity. No hydronephrosis or shadowing stone. Left Kidney: Renal measurements: 9.5 x 5.5 x 4.6 cm = volume: 125 mL. Mildly atrophic. Normal echogenicity. No hydronephrosis or shadowing stone. Bladder: The urinary bladder is suboptimally visualized but grossly unremarkable. Other: None. IMPRESSION: No hydronephrosis or shadowing stone. Electronically Signed   By: Elgie Collard M.D.   On: 09/06/2023 13:12   VAS [place] ABI WITH/WO TBI Result Date: 09/05/2023  LOWER EXTREMITY DOPPLER STUDY Patient Name:  BRANDYN LOWREY  Date of Exam:   09/05/2023 Medical Rec #: [id number]          Accession #:    [id number] Date of Birth: 01-14-1951          Patient Gender: M Patient Age:   50 years Exam Location:  [facility] Procedure:      VAS [place] ABI WITH/WO TBI Referring Phys: Jamelle Rushing --------------------------------------------------------------------------------  Indications: Peripheral artery disease. High Risk Factors: Hypertension, hyperlipidemia, Diabetes.  Limitations: Today's exam was limited due  to patient positioning, patient              intolerant to cuff pressure, an open wound, bandages and              involuntary patient movement. Comparison Study: No prior studies. Performing Technologist: Olen Cordial RVT  Examination Guidelines: A complete evaluation includes at minimum, Doppler waveform signals and systolic blood pressure reading at the level of bilateral brachial, anterior tibial, and posterior tibial arteries, when vessel segments are accessible. Bilateral testing is considered an integral part of a complete examination. Photoelectric Plethysmograph (PPG) waveforms and toe systolic pressure readings are included as required and additional duplex testing as needed. Limited examinations for reoccurring indications may be performed as noted.  ABI Findings: +---------+------------------+-----+-----------+--------+ Right    Rt Pressure (mmHg)IndexWaveform   Comment  +---------+------------------+-----+-----------+--------+ Brachial 119                    triphasic           +---------+------------------+-----+-----------+--------+ PTA      137               1.15 multiphasic         +---------+------------------+-----+-----------+--------+ DP       132               1.11 multiphasic         +---------+------------------+-----+-----------+--------+ Great Toe  Ulcer    +---------+------------------+-----+-----------+--------+ +---------+------------------+-----+----------+-------+ Left     Lt Pressure (mmHg)IndexWaveform  Comment +---------+------------------+-----+----------+-------+ Brachial 101                    triphasic         +---------+------------------+-----+----------+-------+ PTA                             monophasic        +---------+------------------+-----+----------+-------+ DP                              monophasic        +---------+------------------+-----+----------+-------+ Great Toe                                  Ulcer   +---------+------------------+-----+----------+-------+ +-------+-----------+-----------+------------+------------+ ABI/TBIToday's ABIToday's TBIPrevious ABIPrevious TBI +-------+-----------+-----------+------------+------------+ Right  1.15                                           +-------+-----------+-----------+------------+------------+   Summary: Right: Resting right ankle-brachial index is within normal range. Unable to obtain TBI due to great toe ulceration. Left: Unable to obtain ABI due to patient pain tolerance and movement. Monophasic waveforms are detected in the dorsalis pedis and posterior tibial arteries. Unable to obtain TBI due to great toe ulceration. *See table(s) above for measurements and observations.  Electronically signed by Angela Kell MD on 09/05/2023 at 3:52:44 PM.    Final     Microbiology: Results for orders placed or performed during the hospital encounter of 09/01/23  Culture, blood (routine x 2)     Status: None   Collection Time: 09/01/23 12:25 PM   Specimen: BLOOD LEFT ARM  Result Value Ref Range Status   Specimen Description   Final    BLOOD LEFT ARM Performed at [facility], [address], [place], [place] [address]    Special Requests   Final    BOTTLES DRAWN AEROBIC AND ANAEROBIC Blood Culture adequate volume Performed at [facility], [address], [place], [place] [address]    Culture   Final    NO GROWTH 5 DAYS Performed at [facility], [address], [place], [place] [address]    Report Status 09/06/2023 FINAL  Final  Culture, blood (routine x 2)     Status: None   Collection Time: 09/01/23  1:18 PM   Specimen: BLOOD RIGHT FOREARM  Result Value Ref Range Status   Specimen Description   Final    BLOOD RIGHT FOREARM Performed at [facility], [address] [place], [place] [address]    Special Requests   Final    BOTTLES DRAWN AEROBIC AND ANAEROBIC Blood  Culture adequate volume Performed at [facility], [address], [place], [place] [address]    Culture   Final    NO GROWTH 5 DAYS Performed at [facility], [address] [place], [place] [address]    Report Status 09/06/2023 FINAL  Final  Resp panel by RT-PCR (RSV, Flu A&B, Covid) Anterior Nasal Swab     Status: None   Collection Time: 09/01/23  1:18 PM   Specimen: Anterior Nasal Swab  Result Value  Ref Range Status   SARS Coronavirus 2 by RT PCR NEGATIVE NEGATIVE Final    Comment: (NOTE) SARS-CoV-2 target nucleic acids are NOT DETECTED.  The SARS-CoV-2 RNA is generally detectable in upper respiratory specimens during the acute phase of infection. The lowest concentration of SARS-CoV-2 viral copies this assay can detect is 138 copies/mL. A negative result does not preclude SARS-Cov-2 infection and should not be used as the sole basis for treatment or other patient management decisions. A negative result may occur with  improper specimen collection/handling, submission of specimen other than nasopharyngeal swab, presence of viral mutation(s) within the areas targeted by this assay, and inadequate number of viral copies(<138 copies/mL). A negative result must be combined with clinical observations, patient history, and epidemiological information. The expected result is Negative.  Fact Sheet for Patients:  BloggerCourse.com  Fact Sheet for Healthcare Providers:  SeriousBroker.it  This test is no t yet approved or cleared by the [place] FDA and  has been authorized for detection and/or diagnosis of SARS-CoV-2 by FDA under an Emergency Use Authorization (EUA). This EUA will remain  in effect (meaning this test can be used) for the duration of the COVID-19 declaration under Section 564(b)(1) of the Act, 21 U.S.C.section 360bbb-3(b)(1), unless the authorization is terminated  or revoked sooner.        Influenza A by PCR NEGATIVE NEGATIVE Final   Influenza B by PCR NEGATIVE NEGATIVE Final    Comment: (NOTE) The Xpert Xpress SARS-CoV-2/FLU/RSV plus assay is intended as an aid in the diagnosis of influenza from Nasopharyngeal swab specimens and should not be used as a sole basis for treatment. Nasal washings and aspirates are unacceptable for Xpert Xpress SARS-CoV-2/FLU/RSV testing.  Fact Sheet for Patients: BloggerCourse.com  Fact Sheet for Healthcare Providers: SeriousBroker.it  This test is not yet approved or cleared by the [place] FDA and has been authorized for detection and/or diagnosis of SARS-CoV-2 by FDA under an Emergency Use Authorization (EUA). This EUA will remain in effect (meaning this test can be used) for the duration of the COVID-19 declaration under Section 564(b)(1) of the Act, 21 U.S.C. section 360bbb-3(b)(1), unless the authorization is terminated or revoked.     Resp Syncytial Virus by PCR NEGATIVE NEGATIVE Final    Comment: (NOTE) Fact Sheet for Patients: BloggerCourse.com  Fact Sheet for Healthcare Providers: SeriousBroker.it  This test is not yet approved or cleared by the [place] FDA and has been authorized for detection and/or diagnosis of SARS-CoV-2 by FDA under an Emergency Use Authorization (EUA). This EUA will remain in effect (meaning this test can be used) for the duration of the COVID-19 declaration under Section 564(b)(1) of the Act, 21 U.S.C. section 360bbb-3(b)(1), unless the authorization is terminated or revoked.  Performed at [facility], [address], [place], [place] [address]   MRSA Next Gen by PCR, Nasal     Status: None   Collection Time: 09/02/23 11:45 AM   Specimen: Nasal Mucosa; Nasal Swab  Result Value Ref Range Status   MRSA by PCR Next Gen NOT DETECTED NOT DETECTED Final     Comment: (NOTE) The GeneXpert MRSA Assay (FDA approved for NASAL specimens only), is one component of a comprehensive MRSA colonization surveillance program. It is not intended to diagnose MRSA infection nor to guide or monitor treatment for MRSA infections. Test performance is not FDA approved in patients less than 51 years old. Performed at [facility], [address] [place], [place] [address]  Labs: CBC: Recent Labs  Lab 09/28/23 0357 09/29/23 0338 09/29/23 1648 09/30/23 0333 10/01/23 0344  WBC 8.9 8.3 9.6 9.9 9.6  HGB 7.5* 6.9* 8.5* 8.3* 7.8*  HCT 25.9* 23.5* 28.4* 28.5* 25.7*  MCV 85.2 85.5 83.5 84.6 84.3  PLT 272 229 265 266 248   Basic Metabolic Panel: Recent Labs  Lab 09/27/23 0304 09/28/23 0357 09/29/23 0338 09/30/23 0333 10/01/23 0344 10/02/23 0310  NA 132*  132* 132*  131* 130* 132* 131* 133*  K 3.4*  3.4* 4.3  4.2 3.8 3.9 3.7 3.6  CL 97*  96* 96*  95* 96* 97* 95* 98  CO2 27  27 26  25 26 26 27 27   GLUCOSE 105*  104* 94  95 92 88 102* 95  BUN 34*  34* 46*  45* 32* 42* 54* 58*  CREATININE 4.37*  4.37* 5.57*  5.45* 4.20* 5.10* 5.78* 6.10*  CALCIUM 8.0*  8.0* 8.3*  8.3* 8.1* 8.4* 8.4* 8.4*  MG 2.0 2.1 1.9 2.0  --   --   PHOS 3.3 4.7* 3.1 4.5 4.3 4.2   Liver Function Tests: Recent Labs  Lab 09/28/23 0357 09/29/23 0338 09/30/23 0333 10/01/23 0344 10/02/23 0310  ALBUMIN 2.2* 2.3* 2.5* 2.3* 2.3*   CBG: Recent Labs  Lab 09/27/23 0753  GLUCAP 91    Discharge time spent: less than 30 minutes.  Signed: Trenton Frock, MD [facility] 10/03/2023

## 2023-10-19 DEATH — deceased

## 2023-11-10 NOTE — Progress Notes (Deleted)
 {This patient may be at risk for Amyloid. He has one or more dx on the prob list or PMH from the following list -  Abnormal EKG, HFpEF/Diastolic CHF, Aortic Stenosis, LVH, Bilateral Carpal Tunnel Syndrome, Biceps Tendon Rupture, Spinal Stenosis, Pericardial Effusion, Left Atrial Enlargement, Conduction System Disorder. See list below or review PMH.  Diagnoses From Problem List           Noted     Chronic heart failure with preserved ejection fraction (HCC) 12/30/2020     Severe aortic stenosis s/p TAVR in Jan 2022 Unknown    Click HERE to open Cardiac Amyloid Screening SmartSet to order screening OR Click HERE to defer testing for 1 year or permanently :1}    OFFICE NOTE Date:  11/10/2023  ID:  Jordan [name], DOB 12-10-1950, MRN [id number] PCP: Jordan Sabal, NP  [facility] Providers Cardiologist:  Jordan Gathers, MD Structural Heart:  Jordan Lapping, MD{ Click to update primary MD,subspecialty MD or APP then REFRESH:1}    Patient Profile:     (HFpEF) heart failure with preserved ejection fraction  Echo 9/21: EF 55-60 Admx July 22 w a/c CHF and AF w RVR in setting of L leg cellulitis Admx Aug 22 w a/c CHF c/b AF w RVR  TTE 07/14/21: EF 55-60, s/p TAVR with mean gradient 8.2, mild AI, no RWMA, normal RVSF, severe LAE, mild RAE, trivial MR  TTE 09/08/23: poor study; EF 60-65, TAVR not well visualized  Coronary artery disease Moderate nonobstructive by cath 9/21 PET MPI 12/14/21: No ischemia or infarction, EF 51, global MBFR normal, severe coronary Ca2+; low risk  Aortic valve disease Severe aortic insufficiency, moderate to severe aortic stenosis S/p TAVR in 1/22 Echo 2/22: EF 60-65, mild to moderate paravalvular leak TTE 06/2021: mild AI  Persistent Atrial fibrillation  ESRD on dialysis  COPD Hypertension Hyperlipidemia Diabetes mellitus Tobacco abuse Aortic atherosclerosis  OSA GERD Venous insufficiency Chronic normocytic anemia Hx of CVA        Discussed the  use of AI scribe software for clinical note transcription with the patient, who gave verbal consent to proceed. History of Present Illness Jordan [name] is a 73 y.o. male who returns for post hospital follow up. He was last seen in 10/2021. He was admitted in 09/01/23-10/03/23 with cellulitis c/b a/c CHF requiring IV diuresis, anemia in the setting of hematuria, progressive renal failure leading to dialysis. He was seen by Cardiology due to RVR with atrial fibrillation. TTE was poor quality but did not appear to show elevated gradients across his TAVR. His anticoagulation was held due to hematuria. He was seen by Palliative Care and Nephrology. It was felt that dialysis will not likely prolong life or provide quality of life. He was [facility] to [facility] with Hospice, full comfort care.     ROS-See HPI***    Studies Reviewed:      *** Results  Risk Assessment/Calculations: {Does this patient have ATRIAL FIBRILLATION?:[id number]} No BP recorded.  {Refresh Note OR Click here to enter BP  :1}***      Physical Exam:  VS:  There were no vitals taken for this visit.   Wt Readings from Last 3 Encounters:  10/01/23 219 lb 5.7 oz (99.5 kg)  04/12/22 164 lb 14.5 oz (74.8 kg)  03/12/22 174 lb (78.9 kg)    Physical Exam***     Assessment and Plan: Assessment & Plan  Assessment and Plan Assessment & Plan  {CAD (coronary artery disease) Moderate  nonobstructive CAD by cardiac catheterization in 2021.  He continues to smoke and he is a diabetic.  He has had some recent jaw pain but no exertional symptoms.  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  We are proceeding with stress testing as noted. Arrange cardiac PET He is not on aspirin  as he is on Apixaban  Continue atorvastatin  80 mg daily, beta-blocker   Chronic heart failure with preserved ejection fraction (HCC) EF 55-60 by echocardiogram in January 2023.  Volume status currently appears stable.  Continue furosemide  40 mg every other day  alternating with 20 mg every other day.  Consider SGLT2 inhibitor or Entresto at follow-up appointment.   Essential hypertension Borderline control.  As noted, he will return in 1 to 2 weeks for an EKG.  We will get a blood pressure check at that time.   HLD (hyperlipidemia) Labs obtained through [facility] - personally reviewed and interpreted: 03/12/2021: Total cholesterol 153, HDL 44, LDL 97, triglycerides 59, ALT 10 LDL was optimal in 2021.  Continue atorvastatin  80 mg daily.  Plan follow-up CMET, lipids in 6 months.  If LDL remains above 70, consider addition of ezetimibe.    Persistent atrial fibrillation (HCC) Status post cardioversion in January 2023.  He is maintaining sinus rhythm.  Continue apixaban  5 mg twice daily (age <80, weight >60 kg, creatinine <1.5).   Severe aortic stenosis s/p TAVR in Jan 2022 Normally functioning TAVR by echocardiogram in January 2023.  He does have mild paravalvular leak.  Continue follow-up with structural heart as planned.  Continue SBE prophylaxis.   Stage 3 chronic kidney disease (HCC) Recent creatinine stable.   Sinus bradycardia He has profound sinus bradycardia on electrocardiogram today.  He has not symptomatic.  I will reduce his metoprolol  tartrate to 37.5 mg twice daily.  Arrange follow-up nurse visit in 1 to 2 weeks for EKG and blood pressure check.      :1}    {Are you ordering a CV Procedure (e.g. stress test, cath, DCCV, TEE, etc)?   Press F2        :[id number]}  Dispo:  No follow-ups on file. Signed, Marlyse Single, PA-C

## 2023-11-14 ENCOUNTER — Ambulatory Visit: Admitting: Physician Assistant

## 2023-11-14 DIAGNOSIS — I350 Nonrheumatic aortic (valve) stenosis: Secondary | ICD-10-CM

## 2023-11-14 DIAGNOSIS — I4819 Other persistent atrial fibrillation: Secondary | ICD-10-CM

## 2023-11-14 DIAGNOSIS — I2510 Atherosclerotic heart disease of native coronary artery without angina pectoris: Secondary | ICD-10-CM

## 2023-11-14 DIAGNOSIS — I5032 Chronic diastolic (congestive) heart failure: Secondary | ICD-10-CM
